# Patient Record
Sex: Male | Born: 1960
Health system: Southern US, Community
[De-identification: ages and names within clinical notes are randomized; demographics above are authoritative.]

## PROBLEM LIST (undated history)

## (undated) DIAGNOSIS — Z95810 Presence of automatic (implantable) cardiac defibrillator: Secondary | ICD-10-CM

## (undated) DIAGNOSIS — F32A Depression, unspecified: Secondary | ICD-10-CM

## (undated) DIAGNOSIS — R519 Headache, unspecified: Secondary | ICD-10-CM

## (undated) DIAGNOSIS — E119 Type 2 diabetes mellitus without complications: Secondary | ICD-10-CM

## (undated) DIAGNOSIS — I110 Hypertensive heart disease with heart failure: Secondary | ICD-10-CM

## (undated) DIAGNOSIS — F419 Anxiety disorder, unspecified: Secondary | ICD-10-CM

## (undated) DIAGNOSIS — I499 Cardiac arrhythmia, unspecified: Secondary | ICD-10-CM

## (undated) DIAGNOSIS — R51 Headache: Secondary | ICD-10-CM

## (undated) DIAGNOSIS — I428 Other cardiomyopathies: Secondary | ICD-10-CM

## (undated) DIAGNOSIS — G8929 Other chronic pain: Secondary | ICD-10-CM

## (undated) DIAGNOSIS — M1990 Unspecified osteoarthritis, unspecified site: Secondary | ICD-10-CM

## (undated) DIAGNOSIS — Z973 Presence of spectacles and contact lenses: Secondary | ICD-10-CM

## (undated) DIAGNOSIS — J302 Other seasonal allergic rhinitis: Secondary | ICD-10-CM

## (undated) DIAGNOSIS — I5042 Chronic combined systolic (congestive) and diastolic (congestive) heart failure: Secondary | ICD-10-CM

## (undated) DIAGNOSIS — I5040 Unspecified combined systolic (congestive) and diastolic (congestive) heart failure: Secondary | ICD-10-CM

## (undated) DIAGNOSIS — G4730 Sleep apnea, unspecified: Secondary | ICD-10-CM

## (undated) DIAGNOSIS — K922 Gastrointestinal hemorrhage, unspecified: Secondary | ICD-10-CM

## (undated) DIAGNOSIS — S6291XA Unspecified fracture of right wrist and hand, initial encounter for closed fracture: Secondary | ICD-10-CM

## (undated) DIAGNOSIS — M25552 Pain in left hip: Secondary | ICD-10-CM

## (undated) DIAGNOSIS — F329 Major depressive disorder, single episode, unspecified: Secondary | ICD-10-CM

## (undated) HISTORY — PX: CATARACT EXTRACTION: SUR2

## (undated) HISTORY — PX: WISDOM TOOTH EXTRACTION: SHX21

## (undated) HISTORY — DX: Other cardiomyopathies: I42.8

## (undated) HISTORY — DX: Unspecified combined systolic (congestive) and diastolic (congestive) heart failure: I50.40

## (undated) HISTORY — DX: Chronic combined systolic (congestive) and diastolic (congestive) heart failure: I50.42

## (undated) HISTORY — DX: Hypertensive heart disease with heart failure: I11.0

## (undated) HISTORY — PX: CARDIAC CATHETERIZATION: SHX172

---
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
## 2012-07-23 ENCOUNTER — Ambulatory Visit (HOSPITAL_COMMUNITY)
Admission: RE | Admit: 2012-07-23 | Discharge: 2012-07-23 | Disposition: A | Discharge status: Home / Self Care | Payer: 59 | Attending: Psychiatry | Admitting: Psychiatry

## 2012-07-23 DIAGNOSIS — Z7189 Other specified counseling: Secondary | ICD-10-CM | POA: Insufficient documentation

## 2012-07-23 DIAGNOSIS — F1011 Alcohol abuse, in remission: Secondary | ICD-10-CM | POA: Insufficient documentation

## 2012-07-23 DIAGNOSIS — Z630 Problems in relationship with spouse or partner: Secondary | ICD-10-CM | POA: Insufficient documentation

## 2012-07-23 NOTE — BH Assessment (Addendum)
Assessment Note   John [name] is an 51 y.o. male who presents voluntarily for assessment. Pt is in divorce proceedings with his wife. It was requested pt have assessment at our facility. Pt is pleasant and cooperative. He reports euthymic mood. Pt's affect is mood congruent. He denies SI and HI. He denies [facility] and no delusions noted. Pt states he has been sober since 09/18/11. Pt reports he attends Alcoholic Anonymous meetings regularly. He states he worked w/ his AA sponsor regularly until sponsor's recent relapse. Pt went to [facility] 2006 for alcohol dependence. Pt reports decreased sleep, but he gets 8 hrs of sleep nightly. He states he works full time at [employer]. Pt saw Rosal[facility]ess [facility] for therapy until Millan quit taking pt's insurance. Pt does not meet criteria for inpatient or outpatient treatment. Patient states he will continue to regularly attend AA meetings. Pt declined request for a medical screening evaluation.     Axis I: V61.10 Partner Relational Problem            Alcohol Abuse, Early Full Remission            Axis II: Deferred Axis III: No past medical history on file. Axis IV: other psychosocial or environmental problems Axis V: 85  Past Medical History: No past medical history on file.  No past surgical history on file.  Family History: No family history on file.  Social History:  does not have a smoking history on file. He does not have any smokeless tobacco history on file. His alcohol and drug histories not on file.  Additional Social History:  Alcohol / Drug Use Pain Medications: none Prescriptions: none Over the Counter: none History of alcohol / drug use?: Yes Substance #1 Name of Substance 1: alcohol 1 - Age of First Use: 17 1 - Amount (size/oz): varied 1 - Duration: sober since 09/18/11 1 - Last Use / Amount: 09/18/11 - 2 glasses wine  CIWA:   COWS:    Allergies: Allergies no known allergies  Home Medications:  (Not in a hospital  admission)  OB/GYN Status:  No LMP for male patient.  General Assessment Data Location of Assessment: [facility] Assessment Services Living Arrangements: Alone Can pt return to current living arrangement?: Yes Admission Status: Voluntary Is patient capable of signing voluntary admission?: Yes Transfer from: Home Referral Source: Self/Family/Friend  Education Status Is patient currently in school?: No Highest grade of school patient has completed: 31 Name of school: [employer] in [place]  Risk to self Suicidal Ideation: No Suicidal Intent: No Is patient at risk for suicide?: No Suicidal Plan?: No Access to Means: No What has been your use of drugs/alcohol within the last 12 months?: last used alcohol 09/18/11 Previous Attempts/Gestures: No How many times?: 0  Other Self Harm Risks: na Triggers for Past Attempts: Other (Comment) (na) Intentional Self Injurious Behavior: None Family Suicide History: No Recent stressful life event(s): Divorce (ongoing divorce proceedings) Persecutory voices/beliefs?: No Depression: No Substance abuse history and/or treatment for substance abuse?: Yes Suicide prevention information given to non-admitted patients: Not applicable  Risk to Others Homicidal Ideation: No Thoughts of Harm to Others: No Current Homicidal Intent: No Current Homicidal Plan: No Access to Homicidal Means: No Identified Victim: na History of harm to others?: No Assessment of Violence: None Noted Violent Behavior Description: pt denies violence behavior Does patient have access to weapons?: No Criminal Charges Pending?: No  Psychosis Hallucinations: None noted Delusions: None noted  Mental Status  Report Appear/Hygiene: Other (Comment) (unremarkable, appropriate in street clothes) Eye Contact: Good Motor Activity: Freedom of movement Speech: Logical/coherent Level of Consciousness: Alert Mood: Other (Comment) (euthymic) Affect: Appropriate to  circumstance Anxiety Level: Minimal Thought Processes: Relevant;Coherent Judgement: Unimpaired Orientation: Person;Situation;Time;Place;Appropriate for developmental age Obsessive Compulsive Thoughts/Behaviors: None  Cognitive Functioning Concentration: Normal Memory: Recent Intact;Remote Intact IQ: Average Insight: Good Impulse Control: Good Appetite: Good Weight Loss: 0  Weight Gain: 0  Sleep: Decreased Total Hours of Sleep: 8  Vegetative Symptoms: None  ADLScreening [facility] Assessment Services) Patient's cognitive ability adequate to safely complete daily activities?: Yes Patient able to express need for assistance with ADLs?: Yes Independently performs ADLs?: Yes (appropriate for developmental age)  Abuse/Neglect [facility]) Physical Abuse: Denies Verbal Abuse: Denies Sexual Abuse: Denies  Prior Inpatient Therapy Prior Inpatient Therapy: Yes Prior Therapy Dates: 2006 Prior Therapy Facilty/Provider(s): [facility] Reason for Treatment: alcohol abuse  Prior Outpatient Therapy Prior Outpatient Therapy: Yes Prior Therapy Dates: recently ended b/c therapist no longer takes insurance Prior [facility]Provider([facility] Reason for Treatment: therapy  ADL Screening (condition at time of admission) Patient's cognitive ability adequate to safely complete daily activities?: Yes Patient able to express need for assistance with ADLs?: Yes Independently performs ADLs?: Yes (appropriate for developmental age) Weakness of Legs: None Weakness of Arms/Hands: None  Home Assistive Devices/Equipment Home Assistive Devices/Equipment: None    Abuse/Neglect Assessment (Assessment to be complete while patient is alone) Physical Abuse: Denies Verbal Abuse: Denies Sexual Abuse: Denies Exploitation of patient/patient's resources: Denies Self-Neglect: Denies     Merchant navy officer (For Healthcare) Advance Directive: Patient does not have advance directive;Patient would  not like information    Additional Information 1:1 In Past 12 Months?: No CIRT Risk: No Elopement Risk: No Does patient have medical clearance?: No     Disposition:  Disposition Disposition of Patient: Other dispositions Other disposition(s): Other (Comment) (pt will continue to attend AA meetings )  On Site Evaluation by:   Reviewed with Physician:     Donnamarie Rossetti P 07/23/2012 4:40 PM

## 2012-08-26 ENCOUNTER — Ambulatory Visit (HOSPITAL_COMMUNITY)
Admission: RE | Admit: 2012-08-26 | Discharge: 2012-08-26 | Disposition: A | Discharge status: Home / Self Care | Payer: 59 | Attending: Psychiatry | Admitting: Psychiatry

## 2012-08-26 DIAGNOSIS — F1011 Alcohol abuse, in remission: Secondary | ICD-10-CM | POA: Insufficient documentation

## 2012-08-26 DIAGNOSIS — Z630 Problems in relationship with spouse or partner: Secondary | ICD-10-CM | POA: Insufficient documentation

## 2012-08-26 DIAGNOSIS — Z7189 Other specified counseling: Secondary | ICD-10-CM | POA: Insufficient documentation

## 2012-08-26 NOTE — BH Assessment (Signed)
Assessment Note         John [name] is an 51 y.o. male. Pt reports he was his therapist suggested he come here to get   An assessment.  Regarding his substance abuse.  Pt reports he has not drank since 09/19/11 and attends AA. He seems to benefit from AA as he has been sober for 11 months and has no desire to drink. He does not appear to be a candidate for any other program offered here. He denies any DUI charges  or arrests.  He denies suicidal  or homicidal thoughts.  Patient is not psychotic nor delusional.      Axis I: V61.10 Partner Relational Problem            AlcoholAbuse, Early Full Remission Axis II: Deferred Axis III: No past medical history on file. Axis IV: other psychosocial or environmental problems Axis V: 85  Past Medical History: No past medical history on file.  No past surgical history on file.  Family History: No family history on file.  Social History:  does not have a smoking history on file. He does not have any smokeless tobacco history on file. His alcohol and drug histories not on file.  Additional Social History:  Alcohol / Drug Use Pain Medications: na Prescriptions: na Over the Counter: na History of alcohol / drug use?: Yes Longest period of sobriety (when/how long): 11  months Substance #1 Name of Substance 1: beer and wine 1 - Age of First Use: 17 1 - Amount (size/oz): varied 1 - Frequency: sober since 09/18/11 1 - Duration: years 1 - Last Use / Amount: 09-18-11  2 glasses of wine  CIWA:   COWS:    Allergies: Allergies no known allergies  Home Medications:  (Not in a hospital admission)  OB/GYN Status:  No LMP for male patient.  General Assessment Data Location of Assessment: [facility] Assessment Services Living Arrangements: Alone Can pt return to current living arrangement?: Yes Admission Status: Voluntary Is patient capable of signing voluntary admission?: Yes Transfer from: Home Referral Source: Other (attorney per  patient)  Education Status Is patient currently in school?: No Highest grade of school patient has completed: 14 (2 years at [employer], [place]) Name of school: [employer] in [place] person: Winfield Cunas [phone]  Risk to self Suicidal Ideation: No Suicidal Intent: No Is patient at risk for suicide?: No Suicidal Plan?: No Access to Means: No What has been your use of drugs/alcohol within the last 12 months?: alcohol Previous Attempts/Gestures: No How many times?: 0  Other Self Harm Risks: na Triggers for Past Attempts: None known Intentional Self Injurious Behavior: None Family Suicide History: No Recent stressful life event(s): Divorce (ongoing divorce proceedings) Persecutory voices/beliefs?: No Depression: No Depression Symptoms:  (none) Substance abuse history and/or treatment for substance abuse?: Yes Suicide prevention information given to non-admitted patients: Not applicable  Risk to Others Homicidal Ideation: No Thoughts of Harm to Others: No Current Homicidal Intent: No Current Homicidal Plan: No Access to Homicidal Means: No History of harm to others?: No Assessment of Violence: None Noted Violent Behavior Description: none Does patient have access to weapons?: No Criminal Charges Pending?: No Does patient have a court date: No  Psychosis Hallucinations: None noted Delusions: None noted  Mental Status Report Appear/Hygiene: Other (Comment) Eye Contact: Good Motor Activity: Freedom of movement Speech: Logical/coherent Level of Consciousness: Alert Mood: Despair Affect: Appropriate to circumstance Anxiety Level: Minimal Thought Processes: Coherent;Relevant Judgement: Unimpaired Orientation:  Person;Situation;Time;Place;Appropriate for developmental age Obsessive Compulsive Thoughts/Behaviors: None  Cognitive Functioning Concentration: Normal Memory: Recent Intact;Remote Intact IQ: Average Impulse Control: Good Appetite:  Good Weight Loss: 0  Weight Gain: 0  Sleep: Decreased (frequent awakening during the night) Total Hours of Sleep: 8  Vegetative Symptoms: None  ADLScreening [facility] Assessment Services) Patient's cognitive ability adequate to safely complete daily activities?: Yes Patient able to express need for assistance with ADLs?: Yes Independently performs ADLs?: Yes (appropriate for developmental age)  Abuse/Neglect [facility]) Physical Abuse: Denies Verbal Abuse: Denies Sexual Abuse: Denies  Prior Inpatient Therapy Prior Inpatient Therapy: Yes Prior Therapy Dates: 2006 Prior Therapy Facilty/Provider(s): [facility] Reason for Treatment: alcohol abuse  Prior Outpatient Therapy Prior Outpatient Therapy: Yes Prior Therapy Dates: recently ended b/c therapist no longer takes insurance Prior Therapy [facility]/Provider(s): [facility] Reason for Treatment: therapy  ADL Screening (condition at time of admission) Patient's cognitive ability adequate to safely complete daily activities?: Yes Patient able to express need for assistance with ADLs?: Yes Independently performs ADLs?: Yes (appropriate for developmental age) Weakness of Legs: None Weakness of Arms/Hands: None  Home Assistive Devices/Equipment Home Assistive Devices/Equipment: Eyeglasses  Therapy Consults (therapy consults require a physician order) PT Evaluation Needed: No OT Evalulation Needed: No SLP Evaluation Needed: No Abuse/Neglect Assessment (Assessment to be complete while patient is alone) Physical Abuse: Denies Verbal Abuse: Denies Sexual Abuse: Denies Exploitation of patient/patient's resources: Denies Self-Neglect: Denies Values / Beliefs Cultural Requests During Hospitalization: None Spiritual Requests During Hospitalization: None Consults Spiritual Care Consult Needed: No Social Work Consult Needed: No Merchant navy officer (For Healthcare) Advance Directive: Patient does not have advance directive;Patient  would not like information Pre-existing out of facility DNR order (yellow form or pink MOST form): No    Additional Information 1:1 In Past 12 Months?: No CIRT Risk: No Elopement Risk: No Does patient have medical clearance?: No     Disposition: To continue to attend AA meetings Disposition Disposition of Patient: Other dispositions Other disposition(s): Other (Comment) (Pt recommended to continue to attend AA meetings)  On Site Evaluation by:   Reviewed with Physician:     Hattie Perch Winford 08/26/2012 3:26 PM

## 2013-12-27 ENCOUNTER — Ambulatory Visit (INDEPENDENT_AMBULATORY_CARE_PROVIDER_SITE_OTHER): Payer: 59 | Admitting: Psychology

## 2013-12-27 DIAGNOSIS — F4322 Adjustment disorder with anxiety: Secondary | ICD-10-CM

## 2014-01-11 ENCOUNTER — Ambulatory Visit (INDEPENDENT_AMBULATORY_CARE_PROVIDER_SITE_OTHER): Payer: 59 | Admitting: Psychology

## 2014-01-11 DIAGNOSIS — F4322 Adjustment disorder with anxiety: Secondary | ICD-10-CM

## 2014-01-26 ENCOUNTER — Ambulatory Visit (INDEPENDENT_AMBULATORY_CARE_PROVIDER_SITE_OTHER): Payer: 59 | Admitting: Psychology

## 2014-01-26 DIAGNOSIS — F411 Generalized anxiety disorder: Secondary | ICD-10-CM

## 2014-02-04 ENCOUNTER — Ambulatory Visit (INDEPENDENT_AMBULATORY_CARE_PROVIDER_SITE_OTHER): Payer: 59 | Admitting: Psychology

## 2014-02-04 DIAGNOSIS — F411 Generalized anxiety disorder: Secondary | ICD-10-CM

## 2014-02-15 ENCOUNTER — Ambulatory Visit (INDEPENDENT_AMBULATORY_CARE_PROVIDER_SITE_OTHER): Payer: 59 | Admitting: Psychology

## 2014-02-15 DIAGNOSIS — F411 Generalized anxiety disorder: Secondary | ICD-10-CM

## 2014-02-23 ENCOUNTER — Ambulatory Visit (INDEPENDENT_AMBULATORY_CARE_PROVIDER_SITE_OTHER): Payer: 59 | Admitting: Psychology

## 2014-02-23 DIAGNOSIS — F411 Generalized anxiety disorder: Secondary | ICD-10-CM

## 2014-03-01 ENCOUNTER — Ambulatory Visit (INDEPENDENT_AMBULATORY_CARE_PROVIDER_SITE_OTHER): Payer: 59 | Admitting: Psychology

## 2014-03-01 DIAGNOSIS — F411 Generalized anxiety disorder: Secondary | ICD-10-CM

## 2014-03-09 ENCOUNTER — Ambulatory Visit (INDEPENDENT_AMBULATORY_CARE_PROVIDER_SITE_OTHER): Payer: 59 | Admitting: Psychology

## 2014-03-09 DIAGNOSIS — F411 Generalized anxiety disorder: Secondary | ICD-10-CM

## 2014-03-11 ENCOUNTER — Ambulatory Visit: Payer: 59 | Admitting: Psychology

## 2014-03-17 ENCOUNTER — Ambulatory Visit (INDEPENDENT_AMBULATORY_CARE_PROVIDER_SITE_OTHER): Payer: 59 | Admitting: Psychology

## 2014-03-17 DIAGNOSIS — F411 Generalized anxiety disorder: Secondary | ICD-10-CM

## 2014-03-24 ENCOUNTER — Ambulatory Visit (INDEPENDENT_AMBULATORY_CARE_PROVIDER_SITE_OTHER): Payer: 59 | Admitting: Psychology

## 2014-03-24 DIAGNOSIS — F411 Generalized anxiety disorder: Secondary | ICD-10-CM

## 2014-03-31 ENCOUNTER — Ambulatory Visit (INDEPENDENT_AMBULATORY_CARE_PROVIDER_SITE_OTHER): Payer: 59 | Admitting: Psychology

## 2014-03-31 DIAGNOSIS — F411 Generalized anxiety disorder: Secondary | ICD-10-CM

## 2014-04-22 ENCOUNTER — Ambulatory Visit (INDEPENDENT_AMBULATORY_CARE_PROVIDER_SITE_OTHER): Payer: 59 | Admitting: Psychology

## 2014-04-22 DIAGNOSIS — F411 Generalized anxiety disorder: Secondary | ICD-10-CM

## 2014-04-27 ENCOUNTER — Ambulatory Visit (INDEPENDENT_AMBULATORY_CARE_PROVIDER_SITE_OTHER): Payer: 59 | Admitting: Psychology

## 2014-04-27 DIAGNOSIS — F411 Generalized anxiety disorder: Secondary | ICD-10-CM

## 2014-05-03 ENCOUNTER — Ambulatory Visit (INDEPENDENT_AMBULATORY_CARE_PROVIDER_SITE_OTHER): Payer: 59 | Admitting: Psychology

## 2014-05-03 DIAGNOSIS — F411 Generalized anxiety disorder: Secondary | ICD-10-CM

## 2014-05-11 ENCOUNTER — Ambulatory Visit (INDEPENDENT_AMBULATORY_CARE_PROVIDER_SITE_OTHER): Payer: 59 | Admitting: Psychology

## 2014-05-11 DIAGNOSIS — F411 Generalized anxiety disorder: Secondary | ICD-10-CM

## 2014-06-01 ENCOUNTER — Ambulatory Visit (INDEPENDENT_AMBULATORY_CARE_PROVIDER_SITE_OTHER): Payer: 59 | Admitting: Psychology

## 2014-06-01 ENCOUNTER — Ambulatory Visit: Payer: 59 | Admitting: Psychology

## 2014-06-01 DIAGNOSIS — F411 Generalized anxiety disorder: Secondary | ICD-10-CM

## 2014-06-06 ENCOUNTER — Ambulatory Visit (INDEPENDENT_AMBULATORY_CARE_PROVIDER_SITE_OTHER): Payer: 59 | Admitting: Psychology

## 2014-06-06 DIAGNOSIS — F411 Generalized anxiety disorder: Secondary | ICD-10-CM

## 2014-06-14 ENCOUNTER — Ambulatory Visit (INDEPENDENT_AMBULATORY_CARE_PROVIDER_SITE_OTHER): Payer: 59 | Admitting: Psychology

## 2014-06-14 DIAGNOSIS — F411 Generalized anxiety disorder: Secondary | ICD-10-CM

## 2014-07-04 ENCOUNTER — Ambulatory Visit (INDEPENDENT_AMBULATORY_CARE_PROVIDER_SITE_OTHER): Payer: 59 | Admitting: Psychology

## 2014-07-04 DIAGNOSIS — F411 Generalized anxiety disorder: Secondary | ICD-10-CM

## 2014-07-06 ENCOUNTER — Ambulatory Visit (INDEPENDENT_AMBULATORY_CARE_PROVIDER_SITE_OTHER): Payer: 59 | Admitting: Psychology

## 2014-07-06 DIAGNOSIS — F411 Generalized anxiety disorder: Secondary | ICD-10-CM

## 2014-07-13 ENCOUNTER — Ambulatory Visit (INDEPENDENT_AMBULATORY_CARE_PROVIDER_SITE_OTHER): Payer: 59 | Admitting: Psychology

## 2014-07-13 DIAGNOSIS — F411 Generalized anxiety disorder: Secondary | ICD-10-CM

## 2014-08-01 ENCOUNTER — Ambulatory Visit (INDEPENDENT_AMBULATORY_CARE_PROVIDER_SITE_OTHER): Payer: 59 | Admitting: Psychology

## 2014-08-01 DIAGNOSIS — F411 Generalized anxiety disorder: Secondary | ICD-10-CM

## 2014-08-22 ENCOUNTER — Ambulatory Visit: Payer: 59 | Admitting: Psychology

## 2014-08-24 ENCOUNTER — Ambulatory Visit (INDEPENDENT_AMBULATORY_CARE_PROVIDER_SITE_OTHER): Payer: 59 | Admitting: Psychology

## 2014-08-24 DIAGNOSIS — F411 Generalized anxiety disorder: Secondary | ICD-10-CM

## 2014-08-26 ENCOUNTER — Ambulatory Visit (INDEPENDENT_AMBULATORY_CARE_PROVIDER_SITE_OTHER): Payer: 59 | Admitting: Psychology

## 2014-08-26 DIAGNOSIS — F411 Generalized anxiety disorder: Secondary | ICD-10-CM

## 2014-09-21 ENCOUNTER — Ambulatory Visit (INDEPENDENT_AMBULATORY_CARE_PROVIDER_SITE_OTHER): Payer: 59 | Admitting: Psychology

## 2014-09-21 DIAGNOSIS — F411 Generalized anxiety disorder: Secondary | ICD-10-CM

## 2014-09-30 ENCOUNTER — Ambulatory Visit (INDEPENDENT_AMBULATORY_CARE_PROVIDER_SITE_OTHER): Payer: 59 | Admitting: Psychology

## 2014-09-30 DIAGNOSIS — F411 Generalized anxiety disorder: Secondary | ICD-10-CM

## 2014-10-07 ENCOUNTER — Ambulatory Visit: Payer: 59 | Admitting: Psychology

## 2014-10-12 ENCOUNTER — Ambulatory Visit (INDEPENDENT_AMBULATORY_CARE_PROVIDER_SITE_OTHER): Payer: 59 | Admitting: Psychology

## 2014-10-12 DIAGNOSIS — F411 Generalized anxiety disorder: Secondary | ICD-10-CM

## 2014-10-24 ENCOUNTER — Ambulatory Visit (INDEPENDENT_AMBULATORY_CARE_PROVIDER_SITE_OTHER): Payer: 59 | Admitting: Psychology

## 2014-10-24 DIAGNOSIS — F411 Generalized anxiety disorder: Secondary | ICD-10-CM

## 2014-10-28 ENCOUNTER — Ambulatory Visit (INDEPENDENT_AMBULATORY_CARE_PROVIDER_SITE_OTHER): Payer: 59 | Admitting: Psychology

## 2014-10-28 DIAGNOSIS — F411 Generalized anxiety disorder: Secondary | ICD-10-CM

## 2014-12-30 ENCOUNTER — Ambulatory Visit (INDEPENDENT_AMBULATORY_CARE_PROVIDER_SITE_OTHER): Payer: Managed Care, Other (non HMO) | Admitting: Psychology

## 2014-12-30 DIAGNOSIS — F411 Generalized anxiety disorder: Secondary | ICD-10-CM

## 2015-01-09 ENCOUNTER — Ambulatory Visit (INDEPENDENT_AMBULATORY_CARE_PROVIDER_SITE_OTHER): Payer: Managed Care, Other (non HMO) | Admitting: Psychology

## 2015-01-09 DIAGNOSIS — F411 Generalized anxiety disorder: Secondary | ICD-10-CM | POA: Diagnosis not present

## 2015-03-01 ENCOUNTER — Ambulatory Visit (INDEPENDENT_AMBULATORY_CARE_PROVIDER_SITE_OTHER): Payer: Managed Care, Other (non HMO) | Admitting: Psychology

## 2015-03-01 DIAGNOSIS — F411 Generalized anxiety disorder: Secondary | ICD-10-CM | POA: Diagnosis not present

## 2015-03-14 ENCOUNTER — Ambulatory Visit: Payer: Self-pay | Admitting: Orthopedic Surgery

## 2015-03-17 ENCOUNTER — Ambulatory Visit: Payer: Self-pay | Admitting: Orthopedic Surgery

## 2015-03-17 NOTE — Pre-Procedure Instructions (Signed)
Rasool Pennel  03/17/2015      [id number] [name],  - [address] [place] [address] Phone: [phone] Fax: [phone]    Your procedure is scheduled on Monday, March 27, 2015  Report to [facility] Admitting after  calling [phone] at 8:00 A.M. for arrival time on day of procedure.  Call this number if you have problems the morning of surgery:  [phone]   Remember:  Do not eat food or drink liquids after midnight Sunday, March 26, 2015  Take these medicines the morning of surgery with A SIP OF WATER : baclofen (LIORESAL), cetirizine (ZYRTEC),  if needed:LORazepam (ATIVAN)  Stop taking Aspirin, vitamins and herbal medications. Do not take any NSAIDs ie: Ibuprofen, Advil, Naproxen or any medication containing Aspirin; stop now.   Do not wear jewelry, make-up or nail polish.  Do not wear lotions, powders, or perfumes.  You may not wear deodorant.  Do not shave 48 hours prior to surgery.  Men may shave face and neck.  Do not bring valuables to the hospital.  [facility] is not responsible for any belongings or valuables.  Contacts, dentures or bridgework may not be worn into surgery.  Leave your suitcase in the car.  After surgery it may be brought to your room.  For patients admitted to the hospital, discharge time will be determined by your treatment team.  Patients discharged the day of surgery will not be allowed to drive home.   Name and phone number of your driver:   Special instructions: Special Instructions:Special Instructions: [facility] - Preparing for Surgery  Before surgery, you can play an important role.  Because skin is not sterile, your skin needs to be as free of germs as possible.  You can reduce the number of germs on you skin by washing with CHG (chlorahexidine gluconate) soap before surgery.  CHG is an antiseptic cleaner which kills germs and bonds with the skin to continue killing germs even after  washing.  Please DO NOT use if you have an allergy to CHG or antibacterial soaps.  If your skin becomes reddened/irritated stop using the CHG and inform your nurse when you arrive at Short Stay.  Do not shave (including legs and underarms) for at least 48 hours prior to the first CHG shower.  You may shave your face.  Please follow these instructions carefully:   1.  Shower with CHG Soap the night before surgery and the morning of Surgery.  2.  If you choose to wash your hair, wash your hair first as usual with your normal shampoo.  3.  After you shampoo, rinse your hair and body thoroughly to remove the Shampoo.  4.  Use CHG as you would any other liquid soap.  You can apply chg directly  to the skin and wash gently with scrungie or a clean washcloth.  5.  Apply the CHG Soap to your body ONLY FROM THE NECK DOWN.  Do not use on open wounds or open sores.  Avoid contact with your eyes, ears, mouth and genitals (private parts).  Wash genitals (private parts) with your normal soap.  6.  Wash thoroughly, paying special attention to the area where your surgery will be performed.  7.  Thoroughly rinse your body with warm water from the neck down.  8.  DO NOT shower/wash with your normal soap after using and rinsing off the CHG Soap.  9.  Pat yourself dry with  a clean towel.            10.  Wear clean pajamas.            11.  Place clean sheets on your bed the night of your first shower and do not sleep with pets.  Day of Surgery  Do not apply any lotions/deodorants the morning of surgery.  Please wear clean clothes to the hospital/surgery center.  Please read over the following fact sheets that you were given. Pain Booklet, Coughing and Deep Breathing, Blood Transfusion Information, Total Joint Packet, MRSA Information and Surgical Site Infection Prevention

## 2015-03-17 NOTE — H&P (Signed)
TOTAL HIP ADMISSION H&P  Patient is admitted for left total hip arthroplasty.  Subjective:  Chief Complaint: left hip pain  HPI: John [name], 54 y.o. male, has a history of pain and functional disability in the left hip(s) due to arthritis and patient has failed non-surgical conservative treatments for greater than 12 weeks to include NSAID's and/or analgesics, use of assistive devices, weight reduction as appropriate and activity modification.  Onset of symptoms was gradual starting 1 years ago with gradually worsening course since that time.The patient noted no past surgery on the left hip(s).  Patient currently rates pain in the left hip at 10 out of 10 with activity. Patient has night pain, worsening of pain with activity and weight bearing, pain that interfers with activities of daily living and pain with passive range of motion. Patient has evidence of subchondral cysts, subchondral sclerosis, periarticular osteophytes and joint space narrowing by imaging studies. This condition presents safety issues increasing the risk of falls. There is no current active infection.  There are no active problems to display for this patient.  No past medical history on file.  OSA on CPAP No past surgical history on file.  none  (Not in a hospital admission) No Known Allergies  History  Substance Use Topics  . Smoking status: Not on file  . Smokeless tobacco: Not on file  . Alcohol Use: Not on file    No family history on file.   Review of Systems  Constitutional: Negative.   HENT: Negative.   Eyes: Negative.   Respiratory: Negative.   Cardiovascular: Negative.   Gastrointestinal: Negative.   Genitourinary: Negative.   Musculoskeletal: Positive for joint pain.  Skin: Negative.   Neurological: Negative.   Endo/Heme/Allergies: Negative.   Psychiatric/Behavioral: The patient is nervous/anxious.     Objective:  Physical Exam  Vitals reviewed. Constitutional: He is oriented to  person, place, and time. He appears well-developed and well-nourished.  HENT:  Head: Normocephalic and atraumatic.  Eyes: Conjunctivae and EOM are normal. Pupils are equal, round, and reactive to light.  Neck: Normal range of motion. Neck supple.  Cardiovascular: Normal rate, regular rhythm, normal heart sounds and intact distal pulses.   Respiratory: Effort normal and breath sounds normal.  GI: Soft. Bowel sounds are normal. He exhibits no distension. There is no tenderness.  Genitourinary:  deferred  Musculoskeletal:       Left hip: He exhibits decreased range of motion and decreased strength.  Neurological: He is alert and oriented to person, place, and time. He has normal reflexes.  Skin: Skin is warm and dry.  Psychiatric: He has a normal mood and affect. His behavior is normal. Judgment and thought content normal.    Vital signs in last 24 hours: @VSRANGES@  Labs:   There is no height or weight on file to calculate BMI.   Imaging Review Plain radiographs demonstrate severe degenerative joint disease of the left hip(s). The bone quality appears to be adequate for age and reported activity level.  Assessment/Plan:  End stage arthritis, left hip(s)  The patient history, physical examination, clinical judgement of the provider and imaging studies are consistent with end stage degenerative joint disease of the left hip(s) and total hip arthroplasty is deemed medically necessary. The treatment options including medical management, injection therapy, arthroscopy and arthroplasty were discussed at length. The risks and benefits of total hip arthroplasty were presented and reviewed. The risks due to aseptic loosening, infection, stiffness, dislocation/subluxation,  thromboembolic complications and other imponderables were discussed.    The patient acknowledged the explanation, agreed to proceed with the plan and consent was signed. Patient is being admitted for inpatient treatment for  surgery, pain control, PT, OT, prophylactic antibiotics, VTE prophylaxis, progressive ambulation and ADL's and discharge planning.The patient is planning to be discharged home with home health services

## 2015-03-21 ENCOUNTER — Encounter (HOSPITAL_COMMUNITY): Payer: Self-pay

## 2015-03-21 ENCOUNTER — Encounter (HOSPITAL_COMMUNITY)
Admission: RE | Admit: 2015-03-21 | Discharge: 2015-03-21 | Disposition: A | Discharge status: Home / Self Care | Payer: Managed Care, Other (non HMO) | Source: Ambulatory Visit | Attending: Orthopedic Surgery | Admitting: Orthopedic Surgery

## 2015-03-21 DIAGNOSIS — Z01812 Encounter for preprocedural laboratory examination: Secondary | ICD-10-CM | POA: Insufficient documentation

## 2015-03-21 DIAGNOSIS — M1612 Unilateral primary osteoarthritis, left hip: Secondary | ICD-10-CM | POA: Diagnosis not present

## 2015-03-21 HISTORY — DX: Unspecified fracture of right wrist and hand, initial encounter for closed fracture: S62.91XA

## 2015-03-21 HISTORY — DX: Major depressive disorder, single episode, unspecified: F32.9

## 2015-03-21 HISTORY — DX: Other chronic pain: G89.29

## 2015-03-21 HISTORY — DX: Depression, unspecified: F32.A

## 2015-03-21 HISTORY — DX: Pain in left hip: M25.552

## 2015-03-21 HISTORY — DX: Anxiety disorder, unspecified: F41.9

## 2015-03-21 HISTORY — DX: Headache, unspecified: R51.9

## 2015-03-21 HISTORY — DX: Headache: R51

## 2015-03-21 HISTORY — DX: Sleep apnea, unspecified: G47.30

## 2015-03-21 LAB — APTT: aPTT: 29 seconds (ref 24–37)

## 2015-03-21 LAB — COMPREHENSIVE METABOLIC PANEL
ALT: 260 U/L — ABNORMAL HIGH (ref 17–63)
ANION GAP: 9 (ref 5–15)
AST: 159 U/L — ABNORMAL HIGH (ref 15–41)
Albumin: 3.6 g/dL (ref 3.5–5.0)
Alkaline Phosphatase: 59 U/L (ref 38–126)
BILIRUBIN TOTAL: 1.4 mg/dL — AB (ref 0.3–1.2)
BUN: 11 mg/dL (ref 6–20)
CO2: 23 mmol/L (ref 22–32)
Calcium: 9.2 mg/dL (ref 8.9–10.3)
Chloride: 104 mmol/L (ref 101–111)
Creatinine, Ser: 1.02 mg/dL (ref 0.61–1.24)
GFR calc Af Amer: >60 mL/min (ref >60)
GFR calc non Af Amer: >60 mL/min (ref >60)
GLUCOSE: 128 mg/dL — AB (ref 65–99)
Potassium: 3.9 mmol/L (ref 3.5–5.1)
Sodium: 136 mmol/L (ref 135–145)
Total Protein: 7 g/dL (ref 6.5–8.1)

## 2015-03-21 LAB — TYPE AND SCREEN
ABO/RH(D): O POS
Antibody Screen: NEGATIVE

## 2015-03-21 LAB — URINALYSIS, ROUTINE W REFLEX MICROSCOPIC
Glucose, UA: NEGATIVE mg/dL
Hgb urine dipstick: NEGATIVE
KETONES UR: 15 mg/dL — AB
NITRITE: NEGATIVE
PROTEIN: NEGATIVE mg/dL
Specific Gravity, Urine: 1.028 (ref 1.005–1.030)
UROBILINOGEN UA: 0.2 mg/dL (ref 0.0–1.0)
pH: 5 (ref 5.0–8.0)

## 2015-03-21 LAB — URINE MICROSCOPIC-ADD ON

## 2015-03-21 LAB — PROTIME-INR
INR: 0.98 (ref 0.00–1.49)
PROTHROMBIN TIME: 13.2 s (ref 11.6–15.2)

## 2015-03-21 LAB — CBC
HCT: 48.5 % (ref 39.0–52.0)
HEMOGLOBIN: 16 g/dL (ref 13.0–17.0)
MCH: 33.8 pg (ref 26.0–34.0)
MCHC: 33 g/dL (ref 30.0–36.0)
MCV: 102.5 fL — ABNORMAL HIGH (ref 78.0–100.0)
Platelets: 203 10*3/uL (ref 150–400)
RBC: 4.73 MIL/uL (ref 4.22–5.81)
RDW: 12.9 % (ref 11.5–15.5)
WBC: 9.3 10*3/uL (ref 4.0–10.5)

## 2015-03-21 LAB — SURGICAL PCR SCREEN
MRSA, PCR: NEGATIVE
STAPHYLOCOCCUS AUREUS: NEGATIVE

## 2015-03-21 LAB — ABO/RH: ABO/RH(D): O POS

## 2015-03-21 MED ORDER — CHLORHEXIDINE GLUCONATE 4 % EX LIQD
60.0000 mL | Freq: Once | CUTANEOUS | Status: DC
Start: 2015-03-21 — End: 2015-03-22

## 2015-03-21 MED ORDER — CHLORHEXIDINE GLUCONATE 4 % EX LIQD: 60.0000 mL | Freq: Once | CUTANEOUS | Status: DC

## 2015-03-21 NOTE — Progress Notes (Addendum)
PCP- Dr. Margaree Mackintosh  Cardiologist - denies CXR/EKG/Echo/Cardiac Cath/Stress Test - denies  Patient wears CPAP at night but is not aware of settings.

## 2015-03-26 MED ORDER — TRANEXAMIC ACID 1000 MG/10ML IV SOLN
1000.0000 mg | INTRAVENOUS | Status: AC
  Administered 2015-03-27: 1000 mg via INTRAVENOUS
  Filled 2015-03-26 (×3): qty 10

## 2015-03-27 ENCOUNTER — Encounter (HOSPITAL_COMMUNITY): Payer: Self-pay | Admitting: Surgery

## 2015-03-27 ENCOUNTER — Encounter (HOSPITAL_COMMUNITY)
Admission: RE | Disposition: A | Discharge status: Home Health | Payer: Self-pay | Source: Ambulatory Visit | Attending: Orthopedic Surgery

## 2015-03-27 ENCOUNTER — Inpatient Hospital Stay (HOSPITAL_COMMUNITY): Payer: Managed Care, Other (non HMO) | Admitting: Anesthesiology

## 2015-03-27 ENCOUNTER — Inpatient Hospital Stay (HOSPITAL_COMMUNITY): Payer: Managed Care, Other (non HMO)

## 2015-03-27 ENCOUNTER — Inpatient Hospital Stay (HOSPITAL_COMMUNITY)
Admission: RE | Admit: 2015-03-27 | Discharge: 2015-03-29 | DRG: 470 | Disposition: A | Discharge status: Home Health | Payer: Managed Care, Other (non HMO) | Source: Ambulatory Visit | Attending: Orthopedic Surgery | Admitting: Orthopedic Surgery

## 2015-03-27 DIAGNOSIS — G4733 Obstructive sleep apnea (adult) (pediatric): Secondary | ICD-10-CM | POA: Diagnosis present

## 2015-03-27 DIAGNOSIS — R740 Nonspecific elevation of levels of transaminase and lactic acid dehydrogenase [LDH]: Secondary | ICD-10-CM

## 2015-03-27 DIAGNOSIS — Z6834 Body mass index (BMI) 34.0-34.9, adult: Secondary | ICD-10-CM | POA: Diagnosis not present

## 2015-03-27 DIAGNOSIS — I10 Essential (primary) hypertension: Secondary | ICD-10-CM | POA: Diagnosis present

## 2015-03-27 DIAGNOSIS — M879 Osteonecrosis, unspecified: Principal | ICD-10-CM | POA: Diagnosis present

## 2015-03-27 DIAGNOSIS — Z419 Encounter for procedure for purposes other than remedying health state, unspecified: Secondary | ICD-10-CM

## 2015-03-27 DIAGNOSIS — M25552 Pain in left hip: Secondary | ICD-10-CM | POA: Diagnosis present

## 2015-03-27 DIAGNOSIS — F419 Anxiety disorder, unspecified: Secondary | ICD-10-CM | POA: Diagnosis present

## 2015-03-27 DIAGNOSIS — R7401 Elevation of levels of liver transaminase levels: Secondary | ICD-10-CM | POA: Diagnosis present

## 2015-03-27 DIAGNOSIS — M1612 Unilateral primary osteoarthritis, left hip: Secondary | ICD-10-CM | POA: Diagnosis present

## 2015-03-27 DIAGNOSIS — Z09 Encounter for follow-up examination after completed treatment for conditions other than malignant neoplasm: Secondary | ICD-10-CM

## 2015-03-27 HISTORY — PX: TOTAL HIP ARTHROPLASTY: SHX124

## 2015-03-27 SURGERY — ARTHROPLASTY, HIP, TOTAL, ANTERIOR APPROACH
Anesthesia: Spinal | Site: Hip | Laterality: Left

## 2015-03-27 MED ORDER — OXYCODONE HCL 5 MG PO TABS
5.0000 mg | ORAL_TABLET | ORAL | Status: DC | PRN
  Administered 2015-03-27 – 2015-03-29 (×8): 10 mg via ORAL
  Filled 2015-03-27 (×8): qty 2

## 2015-03-27 MED ORDER — BUPIVACAINE-EPINEPHRINE (PF) 0.5% -1:200000 IJ SOLN
INTRAMUSCULAR | Status: DC | PRN
  Administered 2015-03-27: 30 mL

## 2015-03-27 MED ORDER — SODIUM CHLORIDE 0.9 % IV SOLN
INTRAVENOUS | Status: DC
  Administered 2015-03-28 (×2): via INTRAVENOUS

## 2015-03-27 MED ORDER — DOCUSATE SODIUM 100 MG PO CAPS
100.0000 mg | ORAL_CAPSULE | Freq: Two times a day (BID) | ORAL | Status: DC
  Administered 2015-03-28 – 2015-03-29 (×3): 100 mg via ORAL
  Filled 2015-03-27 (×3): qty 1

## 2015-03-27 MED ORDER — SENNA 8.6 MG PO TABS
2.0000 | ORAL_TABLET | Freq: Every day | ORAL | Status: DC
  Administered 2015-03-28: 17.2 mg via ORAL
  Filled 2015-03-27: qty 2

## 2015-03-27 MED ORDER — 0.9 % SODIUM CHLORIDE (POUR BTL) OPTIME
TOPICAL | Status: DC | PRN
  Administered 2015-03-27: 1000 mL

## 2015-03-27 MED ORDER — CEFAZOLIN SODIUM-DEXTROSE 2-3 GM-% IV SOLR
2.0000 g | INTRAVENOUS | Status: AC
  Administered 2015-03-27: 2 g via INTRAVENOUS
  Filled 2015-03-27: qty 50

## 2015-03-27 MED ORDER — SODIUM CHLORIDE 0.9 % IR SOLN
Status: DC | PRN
  Administered 2015-03-27: 3000 mL

## 2015-03-27 MED ORDER — MIDAZOLAM HCL 5 MG/5ML IJ SOLN
INTRAMUSCULAR | Status: DC | PRN
  Administered 2015-03-27: 2 mg via INTRAVENOUS

## 2015-03-27 MED ORDER — SODIUM CHLORIDE 0.9 % IJ SOLN
INTRAMUSCULAR | Status: DC | PRN
  Administered 2015-03-27: 30 mL

## 2015-03-27 MED ORDER — METOCLOPRAMIDE HCL 5 MG PO TABS: 5.0000 mg | ORAL_TABLET | Freq: Three times a day (TID) | ORAL | Status: DC | PRN

## 2015-03-27 MED ORDER — PROPOFOL 10 MG/ML IV BOLUS
INTRAVENOUS | Status: AC
  Filled 2015-03-27: qty 20

## 2015-03-27 MED ORDER — LACTATED RINGERS IV SOLN
INTRAVENOUS | Status: DC | PRN
  Administered 2015-03-27 (×3): via INTRAVENOUS

## 2015-03-27 MED ORDER — KETOROLAC TROMETHAMINE 30 MG/ML IJ SOLN
INTRAMUSCULAR | Status: DC | PRN
  Administered 2015-03-27: 30 mg via INTRA_ARTICULAR

## 2015-03-27 MED ORDER — SODIUM CHLORIDE 0.9 % IV SOLN: INTRAVENOUS | Status: DC

## 2015-03-27 MED ORDER — MIDAZOLAM HCL 2 MG/2ML IJ SOLN
INTRAMUSCULAR | Status: AC
  Filled 2015-03-27: qty 2

## 2015-03-27 MED ORDER — ASPIRIN EC 325 MG PO TBEC
325.0000 mg | DELAYED_RELEASE_TABLET | Freq: Two times a day (BID) | ORAL | Status: DC
  Administered 2015-03-28 – 2015-03-29 (×3): 325 mg via ORAL
  Filled 2015-03-27 (×3): qty 1

## 2015-03-27 MED ORDER — PHENOL 1.4 % MT LIQD: 1.0000 | OROMUCOSAL | Status: DC | PRN

## 2015-03-27 MED ORDER — FENTANYL CITRATE (PF) 100 MCG/2ML IJ SOLN
INTRAMUSCULAR | Status: DC | PRN
  Administered 2015-03-27: 50 ug via INTRAVENOUS

## 2015-03-27 MED ORDER — LORAZEPAM 0.5 MG PO TABS
0.5000 mg | ORAL_TABLET | Freq: Three times a day (TID) | ORAL | Status: DC | PRN
  Administered 2015-03-27: 1 mg via ORAL
  Filled 2015-03-27 (×2): qty 2

## 2015-03-27 MED ORDER — FENTANYL CITRATE (PF) 250 MCG/5ML IJ SOLN
INTRAMUSCULAR | Status: AC
  Filled 2015-03-27: qty 5

## 2015-03-27 MED ORDER — MEPERIDINE HCL 25 MG/ML IJ SOLN: 6.2500 mg | INTRAMUSCULAR | Status: DC | PRN

## 2015-03-27 MED ORDER — POVIDONE-IODINE 7.5 % EX SOLN
CUTANEOUS | Status: DC | PRN
  Administered 2015-03-27: 1 via TOPICAL

## 2015-03-27 MED ORDER — HYDROMORPHONE HCL 1 MG/ML IJ SOLN
0.5000 mg | INTRAMUSCULAR | Status: DC | PRN
  Administered 2015-03-28: 0.5 mg via INTRAVENOUS
  Filled 2015-03-27: qty 1

## 2015-03-27 MED ORDER — HYDROMORPHONE HCL 1 MG/ML IJ SOLN
0.2500 mg | INTRAMUSCULAR | Status: DC | PRN
  Administered 2015-03-27 (×2): 0.5 mg via INTRAVENOUS

## 2015-03-27 MED ORDER — LORATADINE 10 MG PO TABS
10.0000 mg | ORAL_TABLET | Freq: Every day | ORAL | Status: DC
  Administered 2015-03-28 – 2015-03-29 (×2): 10 mg via ORAL
  Filled 2015-03-27 (×2): qty 1

## 2015-03-27 MED ORDER — MENTHOL 3 MG MT LOZG: 1.0000 | LOZENGE | OROMUCOSAL | Status: DC | PRN

## 2015-03-27 MED ORDER — BUPIVACAINE HCL (PF) 0.5 % IJ SOLN
INTRAMUSCULAR | Status: AC
  Filled 2015-03-27: qty 10

## 2015-03-27 MED ORDER — PROPOFOL INFUSION 10 MG/ML OPTIME
INTRAVENOUS | Status: DC | PRN
  Administered 2015-03-27 (×2): 100 ug/kg/min via INTRAVENOUS

## 2015-03-27 MED ORDER — DEXAMETHASONE SODIUM PHOSPHATE 10 MG/ML IJ SOLN
10.0000 mg | Freq: Once | INTRAMUSCULAR | Status: AC
  Administered 2015-03-28: 10 mg via INTRAVENOUS
  Filled 2015-03-27: qty 1

## 2015-03-27 MED ORDER — OXYCODONE HCL 5 MG PO TABS
ORAL_TABLET | ORAL | Status: AC
  Administered 2015-03-27: 10 mg via ORAL
  Filled 2015-03-27: qty 2

## 2015-03-27 MED ORDER — HYDROMORPHONE HCL 1 MG/ML IJ SOLN
INTRAMUSCULAR | Status: AC
  Administered 2015-03-27: 0.5 mg via INTRAVENOUS
  Filled 2015-03-27: qty 1

## 2015-03-27 MED ORDER — CEFAZOLIN SODIUM-DEXTROSE 2-3 GM-% IV SOLR
2.0000 g | Freq: Four times a day (QID) | INTRAVENOUS | Status: AC
  Administered 2015-03-28 (×2): 2 g via INTRAVENOUS
  Filled 2015-03-27 (×3): qty 50

## 2015-03-27 MED ORDER — ONDANSETRON HCL 4 MG/2ML IJ SOLN: 4.0000 mg | Freq: Once | INTRAMUSCULAR | Status: DC | PRN

## 2015-03-27 MED ORDER — BUPIVACAINE HCL (PF) 0.5 % IJ SOLN
INTRAMUSCULAR | Status: DC | PRN
  Administered 2015-03-27: 3 mL via INTRATHECAL

## 2015-03-27 MED ORDER — LACTATED RINGERS IV SOLN
INTRAVENOUS | Status: DC
  Administered 2015-03-27: 15:00:00 via INTRAVENOUS

## 2015-03-27 MED ORDER — ONDANSETRON HCL 4 MG/2ML IJ SOLN: 4.0000 mg | Freq: Four times a day (QID) | INTRAMUSCULAR | Status: DC | PRN

## 2015-03-27 MED ORDER — METOCLOPRAMIDE HCL 5 MG/ML IJ SOLN: 5.0000 mg | Freq: Three times a day (TID) | INTRAMUSCULAR | Status: DC | PRN

## 2015-03-27 MED ORDER — ONDANSETRON HCL 4 MG PO TABS: 4.0000 mg | ORAL_TABLET | Freq: Four times a day (QID) | ORAL | Status: DC | PRN

## 2015-03-27 SURGICAL SUPPLY — 51 items
BLADE SAW SGTL 18X1.27X75 (BLADE) ×2 IMPLANT
BLADE SURG ROTATE 9660 (MISCELLANEOUS) IMPLANT
CAPT HIP TOTAL 2 ×2 IMPLANT
CHLORAPREP W/TINT 26ML (MISCELLANEOUS) ×2 IMPLANT
COVER SURGICAL LIGHT HANDLE (MISCELLANEOUS) ×2 IMPLANT
DERMABOND ADVANCED (GAUZE/BANDAGES/DRESSINGS) ×2
DERMABOND ADVANCED .7 DNX12 (GAUZE/BANDAGES/DRESSINGS) ×2 IMPLANT
DRAPE C-ARM 42X72 X-RAY (DRAPES) ×2 IMPLANT
DRAPE IMP U-DRAPE 54X76 (DRAPES) ×4 IMPLANT
DRAPE INCISE IOBAN 66X45 STRL (DRAPES) ×2 IMPLANT
DRAPE STERI IOBAN 125X83 (DRAPES) ×2 IMPLANT
DRAPE U-SHAPE 47X51 STRL (DRAPES) ×6 IMPLANT
DRSG AQUACEL AG ADV 3.5X10 (GAUZE/BANDAGES/DRESSINGS) ×2 IMPLANT
ELECT BLADE 4.0 EZ CLEAN MEGAD (MISCELLANEOUS) ×2
ELECT REM PT RETURN 9FT ADLT (ELECTROSURGICAL) ×2
ELECTRODE BLDE 4.0 EZ CLN MEGD (MISCELLANEOUS) ×1 IMPLANT
ELECTRODE REM PT RTRN 9FT ADLT (ELECTROSURGICAL) ×1 IMPLANT
EVACUATOR 1/8 PVC DRAIN (DRAIN) IMPLANT
GLOVE BIO SURGEON STRL SZ8.5 (GLOVE) ×4 IMPLANT
GLOVE BIOGEL PI IND STRL 8.5 (GLOVE) ×1 IMPLANT
GLOVE BIOGEL PI INDICATOR 8.5 (GLOVE) ×1
GOWN STRL REUS W/ TWL LRG LVL3 (GOWN DISPOSABLE) ×2 IMPLANT
GOWN STRL REUS W/TWL 2XL LVL3 (GOWN DISPOSABLE) ×2 IMPLANT
GOWN STRL REUS W/TWL LRG LVL3 (GOWN DISPOSABLE) ×2
HANDPIECE INTERPULSE COAX TIP (DISPOSABLE) ×1
HOOD PEEL AWAY FACE SHEILD DIS (HOOD) ×4 IMPLANT
KIT BASIN OR (CUSTOM PROCEDURE TRAY) ×2 IMPLANT
KIT ROOM TURNOVER OR (KITS) ×2 IMPLANT
MANIFOLD NEPTUNE II (INSTRUMENTS) ×2 IMPLANT
MARKER SKIN DUAL TIP RULER LAB (MISCELLANEOUS) ×2 IMPLANT
NEEDLE SPNL 18GX3.5 QUINCKE PK (NEEDLE) ×2 IMPLANT
NS IRRIG 1000ML POUR BTL (IV SOLUTION) ×2 IMPLANT
PACK TOTAL JOINT (CUSTOM PROCEDURE TRAY) ×2 IMPLANT
PACK UNIVERSAL I (CUSTOM PROCEDURE TRAY) ×2 IMPLANT
PAD ARMBOARD 7.5X6 YLW CONV (MISCELLANEOUS) ×4 IMPLANT
SEALER BIPOLAR AQUA 6.0 (INSTRUMENTS) IMPLANT
SET HNDPC FAN SPRY TIP SCT (DISPOSABLE) ×1 IMPLANT
SUCTION FRAZIER TIP 10 FR DISP (SUCTIONS) ×2 IMPLANT
SUT ETHIBOND NAB CT1 #1 30IN (SUTURE) ×4 IMPLANT
SUT MNCRL AB 3-0 PS2 18 (SUTURE) ×2 IMPLANT
SUT MON AB 2-0 CT1 36 (SUTURE) ×2 IMPLANT
SUT VIC AB 1 CT1 27 (SUTURE) ×1
SUT VIC AB 1 CT1 27XBRD ANBCTR (SUTURE) ×1 IMPLANT
SUT VIC AB 2-0 CT1 27 (SUTURE) ×1
SUT VIC AB 2-0 CT1 TAPERPNT 27 (SUTURE) ×1 IMPLANT
SUT VLOC 180 0 24IN GS25 (SUTURE) ×2 IMPLANT
SYR 50ML LL SCALE MARK (SYRINGE) ×2 IMPLANT
TOWEL OR 17X24 6PK STRL BLUE (TOWEL DISPOSABLE) ×2 IMPLANT
TOWEL OR 17X26 10 PK STRL BLUE (TOWEL DISPOSABLE) ×2 IMPLANT
TRAY FOLEY CATH 16FR SILVER (SET/KITS/TRAYS/PACK) IMPLANT
WATER STERILE IRR 1000ML POUR (IV SOLUTION) ×6 IMPLANT

## 2015-03-27 NOTE — Discharge Instructions (Signed)
°Dr. Novalyn Lajara °Joint Replacement Specialist °[facility] °[address] °[place], [place] [address] °[phone] ° ° °TOTAL HIP REPLACEMENT POSTOPERATIVE DIRECTIONS ° ° ° °Hip Rehabilitation, Guidelines Following Surgery  ° °WEIGHT BEARING °Weight bearing as tolerated with assist device (walker, cane, etc) as directed, use it as long as suggested by your surgeon or therapist, typically at least 4-6 weeks. ° °The results of a hip operation are greatly improved after range of motion and muscle strengthening exercises. Follow all safety measures which are given to protect your hip. If any of these exercises cause increased pain or swelling in your joint, decrease the amount until you are comfortable again. Then slowly increase the exercises. Call your caregiver if you have problems or questions.  ° °HOME CARE INSTRUCTIONS  °Most of the following instructions are designed to prevent the dislocation of your new hip.  °Remove items at home which could result in a fall. This includes throw rugs or furniture in walking pathways.  °Continue medications as instructed at time of discharge. °· You may have some home medications which will be placed on hold until you complete the course of blood thinner medication. °· You may start showering once you are discharged home. Do not remove your dressing. °Do not put on socks or shoes without following the instructions of your caregivers.   °Sit on chairs with arms. Use the chair arms to help push yourself up when arising.  °Arrange for the use of a toilet seat elevator so you are not sitting low.  °· Walk with walker as instructed.  °You may resume a sexual relationship in one month or when given the OK by your caregiver.  °Use walker as long as suggested by your caregivers.  °You may put full weight on your legs and walk as much as is comfortable. °Avoid periods of inactivity such as sitting longer than an hour when not asleep. This helps prevent  blood clots.  °You may return to work once you are cleared by your surgeon.  °Do not drive a car for 6 weeks or until released by your surgeon.  °Do not drive while taking narcotics.  °Wear elastic stockings for two weeks following surgery during the day but you may remove then at night.  °Make sure you keep all of your appointments after your operation with all of your doctors and caregivers. You should call the office at the above phone number and make an appointment for approximately two weeks after the date of your surgery. °Please pick up a stool softener and laxative for home use as long as you are requiring pain medications. °· ICE to the affected hip every three hours for 30 minutes at a time and then as needed for pain and swelling. Continue to use ice on the hip for pain and swelling from surgery. You may notice swelling that will progress down to the foot and ankle.  This is normal after surgery.  Elevate the leg when you are not up walking on it.   °It is important for you to complete the blood thinner medication as prescribed by your doctor. °· Continue to use the breathing machine which will help keep your temperature down.  It is common for your temperature to cycle up and down following surgery, especially at night when you are not up moving around and exerting yourself.  The breathing machine keeps your lungs expanded and your temperature down. ° °RANGE OF MOTION AND STRENGTHENING EXERCISES  °These exercises are   designed to help you keep full movement of your hip joint. Follow your caregiver's or physical therapist's instructions. Perform all exercises about fifteen times, three times per day or as directed. Exercise both hips, even if you have had only one joint replacement. These exercises can be done on a training (exercise) mat, on the floor, on a table or on a bed. Use whatever works the best and is most comfortable for you. Use music or television while you are exercising so that the exercises  are a pleasant break in your day. This will make your life better with the exercises acting as a break in routine you can look forward to.  °Lying on your back, slowly slide your foot toward your buttocks, raising your knee up off the floor. Then slowly slide your foot back down until your leg is straight again.  °Lying on your back spread your legs as far apart as you can without causing discomfort.  °Lying on your side, raise your upper leg and foot straight up from the floor as far as is comfortable. Slowly lower the leg and repeat.  °Lying on your back, tighten up the muscle in the front of your thigh (quadriceps muscles). You can do this by keeping your leg straight and trying to raise your heel off the floor. This helps strengthen the largest muscle supporting your knee.  °Lying on your back, tighten up the muscles of your buttocks both with the legs straight and with the knee bent at a comfortable angle while keeping your heel on the floor.  ° °SKILLED REHAB INSTRUCTIONS: °If the patient is transferred to a skilled rehab facility following release from the hospital, a list of the current medications will be sent to the facility for the patient to continue.  When discharged from the skilled rehab facility, please have the facility set up the patient's Home Health Physical Therapy prior to being released. Also, the skilled facility will be responsible for providing the patient with their medications at time of release from the facility to include their pain medication and their blood thinner medication. If the patient is still at the rehab facility at time of the two week follow up appointment, the skilled rehab facility will also need to assist the patient in arranging follow up appointment in our office and any transportation needs. ° °MAKE SURE YOU:  °Understand these instructions.  °Will watch your condition.  °Will get help right away if you are not doing well or get worse. ° °Pick up stool softner and  laxative for home use following surgery while on pain medications. °Do not remove your dressing. °The dressing is waterproof--it is OK to take showers. °Continue to use ice for pain and swelling after surgery. °Do not use any lotions or creams on the incision until instructed by your surgeon. °Total Hip Protocol. ° ° °

## 2015-03-27 NOTE — Anesthesia Postprocedure Evaluation (Signed)
Anesthesia Post Note  Patient: John [name]  Procedure(s) Performed: Procedure(s) (LRB): LEFT TOTAL HIP ARTHROPLASTY ANTERIOR APPROACH (Left)  Anesthesia type: MAC/SAB  Patient location: PACU  Post pain: Pain level controlled  Post assessment: Patient's Cardiovascular Status Stable, SAB receding  Last Vitals:  Filed Vitals:   03/27/15 2045  BP: 162/99  Pulse: 77  Temp:   Resp: 12    Post vital signs: Reviewed and stable  Level of consciousness: sedated  Complications: No apparent anesthesia complications

## 2015-03-27 NOTE — Anesthesia Procedure Notes (Signed)
Spinal Patient location during procedure: OR Staffing Anesthesiologist: Nolon Nations Performed by: anesthesiologist  Preanesthetic Checklist Completed: patient identified, site marked, surgical consent, pre-op evaluation, timeout performed, IV checked, risks and benefits discussed and monitors and equipment checked Spinal Block Patient position: sitting Prep: Betadine Patient monitoring: heart rate, continuous pulse ox and blood pressure Location: L3-4 Injection technique: single-shot Needle Needle type: Sprotte  Needle gauge: 24 G Needle length: 9 cm Additional Notes Expiration date of kit checked and confirmed. Patient tolerated procedure well, without complications.

## 2015-03-27 NOTE — H&P (View-Only) (Signed)
TOTAL HIP ADMISSION H&P  Patient is admitted for left total hip arthroplasty.  Subjective:  Chief Complaint: left hip pain  HPI: John [name], 54 y.o. male, has a history of pain and functional disability in the left hip(s) due to arthritis and patient has failed non-surgical conservative treatments for greater than 12 weeks to include NSAID's and/or analgesics, use of assistive devices, weight reduction as appropriate and activity modification.  Onset of symptoms was gradual starting 1 years ago with gradually worsening course since that time.The patient noted no past surgery on the left hip(s).  Patient currently rates pain in the left hip at 10 out of 10 with activity. Patient has night pain, worsening of pain with activity and weight bearing, pain that interfers with activities of daily living and pain with passive range of motion. Patient has evidence of subchondral cysts, subchondral sclerosis, periarticular osteophytes and joint space narrowing by imaging studies. This condition presents safety issues increasing the risk of falls. There is no current active infection.  There are no active problems to display for this patient.  No past medical history on file.  OSA on CPAP No past surgical history on file.  none  (Not in a hospital admission) No Known Allergies  History  Substance Use Topics  . Smoking status: Not on file  . Smokeless tobacco: Not on file  . Alcohol Use: Not on file    No family history on file.   Review of Systems  Constitutional: Negative.   HENT: Negative.   Eyes: Negative.   Respiratory: Negative.   Cardiovascular: Negative.   Gastrointestinal: Negative.   Genitourinary: Negative.   Musculoskeletal: Positive for joint pain.  Skin: Negative.   Neurological: Negative.   Endo/Heme/Allergies: Negative.   Psychiatric/Behavioral: The patient is nervous/anxious.     Objective:  Physical Exam  Vitals reviewed. Constitutional: He is oriented to  person, place, and time. He appears well-developed and well-nourished.  HENT:  Head: Normocephalic and atraumatic.  Eyes: Conjunctivae and EOM are normal. Pupils are equal, round, and reactive to light.  Neck: Normal range of motion. Neck supple.  Cardiovascular: Normal rate, regular rhythm, normal heart sounds and intact distal pulses.   Respiratory: Effort normal and breath sounds normal.  GI: Soft. Bowel sounds are normal. He exhibits no distension. There is no tenderness.  Genitourinary:  deferred  Musculoskeletal:       Left hip: He exhibits decreased range of motion and decreased strength.  Neurological: He is alert and oriented to person, place, and time. He has normal reflexes.  Skin: Skin is warm and dry.  Psychiatric: He has a normal mood and affect. His behavior is normal. Judgment and thought content normal.    Vital signs in last 24 hours: @  Labs:   There is no height or weight on file to calculate BMI.   Imaging Review Plain radiographs demonstrate severe degenerative joint disease of the left hip(s). The bone quality appears to be adequate for age and reported activity level.  Assessment/Plan:  End stage arthritis, left hip(s)  The patient history, physical examination, clinical judgement of the provider and imaging studies are consistent with end stage degenerative joint disease of the left hip(s) and total hip arthroplasty is deemed medically necessary. The treatment options including medical management, injection therapy, arthroscopy and arthroplasty were discussed at length. The risks and benefits of total hip arthroplasty were presented and reviewed. The risks due to aseptic loosening, infection, stiffness, dislocation/subluxation,  thromboembolic complications and other imponderables were discussed.  The patient acknowledged the explanation, agreed to proceed with the plan and consent was signed. Patient is being admitted for inpatient treatment for  surgery, pain control, PT, OT, prophylactic antibiotics, VTE prophylaxis, progressive ambulation and ADL's and discharge planning.The patient is planning to be discharged home with home health services

## 2015-03-27 NOTE — Transfer of Care (Signed)
Immediate Anesthesia Transfer of Care Note  Patient: John [name]  Procedure(s) Performed: Procedure(s): LEFT TOTAL HIP ARTHROPLASTY ANTERIOR APPROACH (Left)  Patient Location: PACU  Anesthesia Type:Spinal  Level of Consciousness: awake, alert  and oriented  Airway & Oxygen Therapy: Patient Spontanous Breathing and Patient connected to nasal cannula oxygen  Post-op Assessment: Report given to RN and Post -op Vital signs reviewed and stable  Post vital signs: Reviewed and stable  Last Vitals:  Filed Vitals:   03/27/15 1502  BP: 174/94  Pulse:   Temp:   Resp:     Complications: No apparent anesthesia complications

## 2015-03-27 NOTE — Interval H&P Note (Signed)
History and Physical Interval Note:  03/27/2015 4:10 PM  John [name]  has presented today for surgery, with the diagnosis of left hip osteoarthritis due to Avascular Necrosis  The various methods of treatment have been discussed with the patient and family. After consideration of risks, benefits and other options for treatment, the patient has consented to  Procedure(s): LEFT TOTAL HIP ARTHROPLASTY ANTERIOR APPROACH (Left) as a surgical intervention .  The patient's history has been reviewed, patient examined, no change in status, stable for surgery.  I have reviewed the patient's chart and labs.  Questions were answered to the patient's satisfaction.     Patient noted to have elevated LFTs. Hospitalist consulted in preop holding, who recommended that we proceed with surgery.  Cache Bills, Cloyde Reams

## 2015-03-27 NOTE — Anesthesia Preprocedure Evaluation (Addendum)
Anesthesia Evaluation  Patient identified by MRN, date of birth, ID band Patient awake    Reviewed: Allergy & Precautions, NPO status , Patient's Chart, lab work & pertinent test results  Airway Mallampati: II  TM Distance: >3 FB Neck ROM: Full    Dental   Pulmonary sleep apnea ,    Pulmonary exam normal       Cardiovascular Normal cardiovascular exam    Neuro/Psych Anxiety Depression    GI/Hepatic   Endo/Other    Renal/GU      Musculoskeletal   Abdominal   Peds  Hematology   Anesthesia Other Findings   Reproductive/Obstetrics                             Anesthesia Physical Anesthesia Plan  ASA: III  Anesthesia Plan: Spinal   Post-op Pain Management:    Induction: Intravenous  Airway Management Planned: Simple Face Mask  Additional Equipment:   Intra-op Plan:   Post-operative Plan:   Informed Consent: I have reviewed the patients History and Physical, chart, labs and discussed the procedure including the risks, benefits and alternatives for the proposed anesthesia with the patient or authorized representative who has indicated his/her understanding and acceptance.     Plan Discussed with: CRNA and Surgeon  Anesthesia Plan Comments: (LFT's elevated. Probably Pain meds related. Pt doesn't feel sick doubt hepatitis. Plan proceed Spinal anesthesia.)        Anesthesia Quick Evaluation

## 2015-03-27 NOTE — Progress Notes (Signed)
[facility] Medical Consultation  John [name] HYW:[id number] DOB: Sep 17, 1960 DOA: 03/27/2015 PCP: Margaree Mackintosh, MD   Requesting physician: Dr Samson Frederic Date of consultation: 03/27/2015 Reason for consultation: Abnormal LFT's  Impression/Recommendations Active Problems:   Elevated transaminase level   OA (osteoarthritis)    1. Preoperative clearance. Patient is a pleasant 54 year old gentleman with a history of morbid obesity, obstructive sleep apnea, osteoarthritis involving his left hip who presents for elective left total hip arthroplasty. Patient is highly functional and does not appear to have acute cardiopulmonary issues. He is able to carry on with day-to-day activities without developing exertional dyspnea or chest pain. His cardiopulmonary exam was benign. Abdominal exam benign as well. Lab work reviewed as he did have an AST 159 and ALT of 260. He does not have evidence of acute decompensated liver failure however. I think he is at a low surgical risk for undergoing left hip arthroplasty. 2. Elevated transaminases. As mentioned above patient having a benign abdominal exam. I wonder about the possibility of this being related to nonalcoholic steatohepatitis given history of obesity. Patient would likely benefit from a further worked up with a right upper quadrant ultrasound. Will also need repeat LFTs. This can be managed in the outpatient setting with his primary care provider.    Chief Complaint: Left hip pain  HPI: Patient is a pleasant 54 year old gentleman with a past medical history of osteoarthritis of left hip, morbid obesity, obstructive sleep apnea, who presents today for elective left total hip arthroplasty. He complains of significant left sided hip pain that has progressively worsened despite conservative measures. Patient otherwise has no complaints. He denies exertional chest pain or shortness of breath, dizziness, lightheadedness, palpitations, abdominal  pain. He reports able to perform all activities of daily living without any issues. He states his only limiting factor has been left hip pain. He denies having a cardiac history, has been diagnosed with obstructive sleep apnea using CPAP at nighttime. He does not smoke, drinks socially. Labs showed an AST of 159 with ALT of 260. Total bilirubin was 1.4. Rest of his lab work otherwise is unremarkable.    Review of Systems:  Constitutional:  No weight loss, night sweats, Fevers, chills, fatigue.  HEENT:  No headaches, Difficulty swallowing,Tooth/dental problems,Sore throat,  No sneezing, itching, ear ache, nasal congestion, post nasal drip,  Cardio-vascular:  No chest pain, Orthopnea, PND, swelling in lower extremities, anasarca, dizziness, palpitations  GI:  No heartburn, indigestion, abdominal pain, nausea, vomiting, diarrhea, change in bowel habits, loss of appetite  Resp:  No shortness of breath with exertion or at rest. No excess mucus, no productive cough, No non-productive cough, No coughing up of blood.No change in color of mucus.No wheezing.No chest wall deformity  Skin:  no rash or lesions.  GU:  no dysuria, change in color of urine, no urgency or frequency. No flank pain.  Musculoskeletal:  No joint pain or swelling. No decreased range of motion. No back pain.  Psych:  No change in mood or affect. No depression or anxiety. No memory loss.   Past Medical History  Diagnosis Date  . Anxiety     Ativan  . Depression     history of  . Right hand fracture   . History of hypertension   . Sleep apnea     wears a CPAP  . Headache   . Chronic left hip pain    Past Surgical History  Procedure Laterality Date  . Wisdom tooth extraction  Social History:  reports that he has never smoked. He has never used smokeless tobacco. He reports that he drinks about 1.8 oz of alcohol per week. He reports that he does not use illicit drugs.  No Known Allergies History reviewed. No  pertinent family history.  Prior to Admission medications   Medication Sig Start Date End Date Taking? Authorizing Provider  baclofen (LIORESAL) 10 MG tablet Take 10-20 mg by mouth 3 (three) times daily.  in the morning,  in the afternoon,  at bedtime 02/27/15  Yes Historical Provider, MD  cetirizine (ZYRTEC) 10 MG tablet Take 10 mg by mouth daily. 02/05/15  Yes Historical Provider, MD  diclofenac (VOLTAREN) 75 MG EC tablet Take 75 mg by mouth 2 (two) times daily with a meal. 02/14/15  Yes Historical Provider, MD  LORazepam (ATIVAN) 1 MG tablet Take 0.5-1 mg by mouth 3 (three) times daily as needed. 03/10/15  Yes Historical Provider, MD   Physical Exam: Blood pressure 174/94, pulse 92, temperature 98.8 F (37.1 C), temperature source Oral, resp. rate 18, height  (1.778 m), weight 109.362 kg (241 lb 1.6 oz), SpO2 97 %. Filed Vitals:   03/27/15 1502  BP: 174/94  Pulse:   Temp:   Resp:      General:  Patient is in no acute distress, he is awake and alert oriented, states doing well currently denies any chest pain or shortness of breath  Eyes: Pupils are equal round reactive to light extraocular movement intact  Neck: No jugular venous distention  Cardiovascular: Regular rate and rhythm normal S1-S2 no murmurs rubs or gallops, no external edema  Respiratory: Normal respiratory effort, lungs are clear to auscultation bilaterally  Abdomen: Obese otherwise soft nontender nondistended, overall benign abdominal exam  Skin: No rashes  Musculoskeletal: Patient reporting left hip pain with passive and active movement of left hip  Psychiatric: Patient is awake alert oriented  Neurologic: Patient having nonfocal neurologic exam, cranial nerves II through XII grossly intact, 5 of 5 muscle strength to bilateral upper and lower extremities, no alteration to sensation  Labs on Admission:  Basic Metabolic Panel:  Recent Labs Lab 03/21/15 0902  NA 136  K 3.9  CL 104  CO2  23  GLUCOSE 128*  BUN 11  CREATININE 1.02  CALCIUM 9.2   Liver Function Tests:  Recent Labs Lab 03/21/15 0902  AST 159*  ALT 260*  ALKPHOS 59  BILITOT 1.4*  PROT 7.0  ALBUMIN 3.6   No results for input(s): LIPASE, AMYLASE in the last 168 hours. No results for input(s): AMMONIA in the last 168 hours. CBC:  Recent Labs Lab 03/21/15 0902  WBC 9.3  HGB 16.0  HCT 48.5  MCV 102.5*  PLT 203   Cardiac Enzymes: No results for input(s): CKTOTAL, CKMB, CKMBINDEX, TROPONINI in the last 168 hours. BNP: Invalid input(s): POCBNP CBG: No results for input(s): GLUCAP in the last 168 hours.  Radiological Exams on Admission: No results found.  EKG: Independently reviewed.   Time spent: 50 minutes  Jeralyn Bennett [facility] Pager [phone]  If 7PM-7AM, please contact night-coverage [email or web address] Password [place] 03/27/2015, 4:38 PM

## 2015-03-27 NOTE — Op Note (Signed)
OPERATIVE REPORT  SURGEON: Samson Frederic, MD   ASSISTANT: Leilani Able, PA-C.  PREOPERATIVE DIAGNOSIS: Left hip arthritis secondary to avascular necrosis.   POSTOPERATIVE DIAGNOSIS: Left hip arthritis secondary to avascular necrosis.   PROCEDURE: Left total hip arthroplasty, anterior approach.   IMPLANTS: DePuy Tri Lock stem, size 5, hi offset. DePuy Pinnacle Cup, size 56 mm. DePuy Altrx liner, size 56 by 36 mm, neutral. DePuy Biolox ceramic head ball, size 36 + 5 mm.  ANESTHESIA:  Spinal  ESTIMATED BLOOD LOSS: 300 mL.  ANTIBIOTICS: 2g ancef.  DRAINS: None.  COMPLICATIONS: None.   CONDITION: PACU - hemodynamically stable.Marland Kitchen   BRIEF CLINICAL NOTE: John [name] is a 54 y.o. male with a long-standing history of Left hip arthritis secondary to AVN. After failing conservative management, the patient was indicated for total hip arthroplasty. The risks, benefits, and alternatives to the procedure were explained, and the patient elected to proceed.  PROCEDURE IN DETAIL: Surgical site was marked by myself. Spinal anesthesia was obtained in the pre-op holding area. Once inside the operative room, a foley catheter was inserted. The patient was then positioned on the Hana table. All bony prominences were well padded. The hip was prepped and draped in the normal sterile surgical fashion. A time-out was called verifying side and site of surgery. The patient received IV antibiotics within 60 minutes of beginning the procedure.  The direct anterior approach to the hip was performed through the Hueter interval. Lateral femoral circumflex vessels were treated with the Auqumantys. The anterior capsule was exposed and an inverted T capsulotomy was made.The femoral neck cut was made to the level of the templated cut. A corkscrew was placed into the head and the head was removed. The femoral head was found to have eburnated bone. The head was passed to the back table and was  measured.  Acetabular exposure was achieved, and the pulvinar and labrum were excised. Sequental reaming of the acetabulum was then performed up to a size 55 mm reamer. A 56 mm cup was then opened and impacted into place at approximately 40 degrees of abduction and 20 degrees of anteversion. The final polyethylene liner was impacted into place and acetabular osteophytes were removed.   I then gained femoral exposure taking care to protect the abductors and greater trochanter. This was performed using standard external rotation, extension, and adduction. The capsule was peeled off the inner aspect of the greater trochanter, taking care to preserve the short external rotators. A cookie cutter was used to enter the femoral canal, and then the femoral canal finder was placed. Sequential broaching was performed up to a size 5. Calcar planer was used on the femoral neck remnant. I paced a hi offset neck and a trial head ball. The hip was reduced. Leg lengths and offset were checked fluoroscopically. The hip was dislocated and trial components were removed. The final implants were placed, and the hip was reduced.  Fluoroscopy was used to confirm component position and leg lengths. At 90 degrees of external rotation and full extension, the hip was stable to an anterior directed force.  The wound was copiously irrigated with a dilute betadine solution followed by normal saline. Marcaine solution was injected into the periarticular soft tissue. The wound was closed in layers using #1 Vicryl and V-Loc for the fascia, 2-0 Vicryl for the subcutaneous fat, 2-0 Monocryl for the deep dermal layer, 3-0 running Monocryl subcuticular stitch, and Dermabond for the skin. Once the glue was fully dried, an Aquacell Ag dressing  was applied. The patient was transported to the recovery room in stable condition. Sponge, needle, and instrument counts were correct at the end of the case x2. The patient tolerated the  procedure well and there were no known complications.  Please note that a surgical assistant was a medical necessity for this procedure to perform it in a safe and expeditious manner. Assistant was necessary to provide appropriate retraction of vital neurovascular structures, to prevent femoral fracture, and to allow for anatomic placement of the prosthesis.

## 2015-03-28 ENCOUNTER — Encounter (HOSPITAL_COMMUNITY): Payer: Self-pay | Admitting: Orthopedic Surgery

## 2015-03-28 LAB — CBC
HCT: 39.9 % (ref 39.0–52.0)
Hemoglobin: 12.6 g/dL — ABNORMAL LOW (ref 13.0–17.0)
MCH: 33 pg (ref 26.0–34.0)
MCHC: 31.6 g/dL (ref 30.0–36.0)
MCV: 104.5 fL — ABNORMAL HIGH (ref 78.0–100.0)
PLATELETS: 192 10*3/uL (ref 150–400)
RBC: 3.82 MIL/uL — ABNORMAL LOW (ref 4.22–5.81)
RDW: 12.7 % (ref 11.5–15.5)
WBC: 9.4 10*3/uL (ref 4.0–10.5)

## 2015-03-28 LAB — BASIC METABOLIC PANEL
Anion gap: 4 — ABNORMAL LOW (ref 5–15)
BUN: 7 mg/dL (ref 6–20)
CHLORIDE: 101 mmol/L (ref 101–111)
CO2: 30 mmol/L (ref 22–32)
Calcium: 8.4 mg/dL — ABNORMAL LOW (ref 8.9–10.3)
Creatinine, Ser: 0.97 mg/dL (ref 0.61–1.24)
GFR calc Af Amer: >60 mL/min (ref >60)
GFR calc non Af Amer: >60 mL/min (ref >60)
Glucose, Bld: 116 mg/dL — ABNORMAL HIGH (ref 65–99)
Potassium: 4.4 mmol/L (ref 3.5–5.1)
Sodium: 135 mmol/L (ref 135–145)

## 2015-03-28 NOTE — Progress Notes (Signed)
Utilization review completed. Vennie Waymire, RN, BSN. 

## 2015-03-28 NOTE — Progress Notes (Signed)
   Subjective:  Patient reports pain as mild.  Denies N/V/CP/SOB.  Objective:   VITALS:   Filed Vitals:   03/27/15 2000 03/27/15 2015 03/27/15 2030 03/27/15 2045  BP: 176/109 169/96 161/89 162/99  Pulse: 80 73 77 77  Temp:    98 F (36.7 C)  TempSrc:      Resp: 17 10 11 12   Height:      Weight:      SpO2: 100% 97% 98% 99%    ABD soft Sensation intact distally Intact pulses distally Dorsiflexion/Plantar flexion intact Incision: dressing C/D/I Compartment soft   Lab Results  Component Value Date   WBC 9.4 03/28/2015   HGB 12.6* 03/28/2015   HCT 39.9 03/28/2015   MCV 104.5* 03/28/2015   PLT 192 03/28/2015   BMET    Component Value Date/Time   NA 135 03/28/2015 0505   K 4.4 03/28/2015 0505   CL 101 03/28/2015 0505   CO2 30 03/28/2015 0505   GLUCOSE 116* 03/28/2015 0505   BUN 7 03/28/2015 0505   CREATININE 0.97 03/28/2015 0505   CALCIUM 8.4* 03/28/2015 0505   GFRNONAA >60 03/28/2015 0505   GFRAA >60 03/28/2015 0505     Assessment/Plan: 1 Day Post-Op   Principal Problem:   Degenerative joint disease of left hip Active Problems:   Elevated transaminase level   WBAT with walker ASA, SCDs, TEDs PT/OT PO pain control Elevated LFTs per hospitalist Dispo: likely home tomorrow   Ishmail Mcmanamon, Cloyde Reams 03/28/2015, 7:03 AM   Samson Frederic, MD Cell [phone]

## 2015-03-28 NOTE — Evaluation (Signed)
Physical Therapy Evaluation Patient Details Name: John [name] MRN: [id number] DOB: 02-23-1961 Today's Date: 03/28/2015   History of Present Illness  Pt is a 54 y/o M s/p L THA.  Pt's PMH includes anxiety and depression, R hand fx, and headache.  Clinical Impression  Pt is s/p L THA resulting in the deficits listed below (see PT Problem List). Pt requires min guard for sit<>stand and ambulation for safety.  Min assist provided 2/2 pain for supine>sit.  Pt will benefit from skilled PT to increase their independence and safety with mobility to allow discharge to the venue listed below.     Follow Up Recommendations Home health PT;Supervision - Intermittent    Equipment Recommendations  Rolling walker with 5" wheels;3in1 (PT)    Recommendations for Other Services OT consult     Precautions / Restrictions Precautions Precautions: Fall Precaution Comments: Direct ant approach: no hip precautions Restrictions Weight Bearing Restrictions: Yes LLE Weight Bearing: Weight bearing as tolerated      Mobility  Bed Mobility Overal bed mobility: Needs Assistance Bed Mobility: Supine to Sit     Supine to sit: Min assist     General bed mobility comments: Min assist managing LLE to EOB.  Use of bed rails, HOB flat.  Increased time and cues for technique.  Transfers Overall transfer level: Needs assistance Equipment used: Rolling walker (2 wheeled) Transfers: Sit to/from Stand Sit to Stand: Min guard         General transfer comment: Pt initially stands EOB w/o prompting and w/o RW in front of him, educated pt on importance of use of RW for support.  Therefore, min guard provided for safety.    Ambulation/Gait Ambulation/Gait assistance: Min guard Ambulation Distance (Feet): 80 Feet Assistive device: Rolling walker (2 wheeled) Gait Pattern/deviations: Step-to pattern;Step-through pattern;Antalgic;Trunk flexed;Decreased stance time - left;Decreased weight shift to left   Gait  velocity interpretation: Below normal speed for age/gender General Gait Details: Heavy WB through BUEs to offload LLE, improves slightly w/ VCs.  Pt demonstrates step through gait w/o cueing.  Dec gait speed.  Cues for proper use of RW.  Stairs            Wheelchair Mobility    Modified Rankin (Stroke Patients Only)       Balance Overall balance assessment: Needs assistance Sitting-balance support: No upper extremity supported;Feet supported Sitting balance-Leahy Scale: Good     Standing balance support: Bilateral upper extremity supported;During functional activity Standing balance-Leahy Scale: Poor Standing balance comment: relies heavily on RW for support                             Pertinent Vitals/Pain Pain Assessment: 0-10 Pain Score: 9  Pain Location: L hip Pain Descriptors / Indicators: Aching;Sore;Tightness Pain Intervention(s): Limited activity within patient's tolerance;Monitored during session;Repositioned    Home Living Family/patient expects to be discharged to:: Private residence Living Arrangements: Spouse/significant other;Other (Comment) (girlfriend and girlfriend's daughter who is an Charity fundraiser) Available Help at Discharge: Family;Available PRN/intermittently Type of Home: House (townhouse) Home Access: Stairs to enter Entrance Stairs-Rails: Can reach both Entrance Stairs-Number of Steps: 3 Home Layout: Two level;Able to live on main level with bedroom/bathroom Home Equipment: Gilmer Mor - single point      Prior Function Level of Independence: Independent with assistive device(s)         Comments: Used cane for support in the community      Hand Dominance  Extremity/Trunk Assessment   Upper Extremity Assessment: Defer to OT evaluation           Lower Extremity Assessment: LLE deficits/detail   LLE Deficits / Details: weakness and limited ROM as expected s/p L THA     Communication   Communication: No difficulties   Cognition Arousal/Alertness: Awake/alert Behavior During Therapy: WFL for tasks assessed/performed Overall Cognitive Status: Within Functional Limits for tasks assessed                      General Comments General comments (skin integrity, edema, etc.): Pt asked to don clothes he has from home and assist was provided to bring shorts onto LLE.  Pt will benefit from OT evaluation for further education on dressing techniques.    Exercises Total Joint Exercises Ankle Circles/Pumps: AROM;Both;15 reps;Supine Quad Sets: AROM;Both;5 reps;Supine Hip ABduction/ADduction: AROM;Left;10 reps;Standing Knee Flexion: AROM;Left;20 reps;Standing      Assessment/Plan    PT Assessment Patient needs continued PT services  PT Diagnosis Difficulty walking;Abnormality of gait;Generalized weakness;Acute pain   PT Problem List Decreased strength;Decreased range of motion;Decreased activity tolerance;Decreased balance;Decreased mobility;Decreased coordination;Decreased knowledge of use of DME;Decreased safety awareness;Decreased knowledge of precautions;Impaired sensation;Decreased skin integrity;Pain;Obesity  PT Treatment Interventions DME instruction;Gait training;Stair training;Functional mobility training;Therapeutic activities;Therapeutic exercise;Balance training;Neuromuscular re-education;Patient/family education;Modalities   PT Goals (Current goals can be found in the Care Plan section) Acute Rehab PT Goals Patient Stated Goal: to go home PT Goal Formulation: With patient Time For Goal Achievement: 04/04/15 Potential to Achieve Goals: Good    Frequency 7X/week   Barriers to discharge Inaccessible home environment;Decreased caregiver support 3 steps to enter home and intermittent assist    Co-evaluation               End of Session Equipment Utilized During Treatment: Gait belt Activity Tolerance: Patient tolerated treatment well;Patient limited by pain Patient left: in  chair;with call bell/phone within reach Nurse Communication: Mobility status;Precautions;Weight bearing status         Time: 1610-9604 PT Time Calculation (min) (ACUTE ONLY): 29 min   Charges:   PT Evaluation $Initial PT Evaluation Tier I: 1 Procedure PT Treatments $Gait Training: 8-22 mins   PT G CodesMichail Jewels PT, DPT [phone] Pager: [phone] 03/28/2015, 12:00 PM

## 2015-03-28 NOTE — Progress Notes (Signed)
Patient has changed his mind and does not want to wear CPAP tonight. RT instructed patient to call respiratory if he changes his mind. RT will continue to monitor.

## 2015-03-28 NOTE — Evaluation (Signed)
Occupational Therapy Evaluation Patient Details Name: John [name] MRN: [id number] DOB: 24-May-1961 Today's Date: 03/28/2015    History of Present Illness Pt is a 54 y.o. M s/p L THA.  Pt's PMH includes anxiety and depression, R hand fx, and headache.   Clinical Impression   Pt s/p above. Pt independent with ADLs, PTA (difficult but managed). Pt wanting additional session to review. Do not feel pt will need follow up OT after hospital stay.    Follow Up Recommendations  No OT follow up;Supervision - Intermittent    Equipment Recommendations  None recommended by OT    Recommendations for Other Services       Precautions / Restrictions Precautions Precautions: Fall Precaution Comments: Direct anterior approach: no hip precautions Restrictions Weight Bearing Restrictions: Yes LLE Weight Bearing: Weight bearing as tolerated      Mobility Bed Mobility               General bed mobility comments: in recliner-not assessed  Transfers Overall transfer level: Needs assistance Transfers: Sit to/from Stand Sit to Stand: Supervision             Balance No physical assist needed for balance in session. Used walker for ambulation.                         ADL Overall ADL's : Needs assistance/impaired                     Lower Body Dressing: Sit to/from stand;Minimal assistance   Toilet Transfer: Supervision/safety;Ambulation;RW (chair)       Tub/ Shower Transfer: Walk-in shower;Supervision/safety;Ambulation;Rolling walker   Functional mobility during ADLs: Supervision/safety;Rolling walker General ADL Comments: Educated on LB dressing technique. Educated on safety such as use of bag on walker, safe footwear, sitting for most of LB ADLs, rugs/items on floor, and recommended someone be with him for shower transfer. Practiced simulated shower transfer.  Educated on AE/cost.     Advice worker      Pertinent  Vitals/Pain Pain Assessment: 0-10 Pain Score: 8  Pain Location: left hip and back  Pain Descriptors / Indicators: Sore Pain Intervention(s): Monitored during session;Repositioned     Hand Dominance     Extremity/Trunk Assessment Upper Extremity Assessment Upper Extremity Assessment: Overall WFL for tasks assessed   Lower Extremity Assessment Lower Extremity Assessment: Defer to PT evaluation       Communication Communication Communication: No difficulties   Cognition Arousal/Alertness: Awake/alert Behavior During Therapy: WFL for tasks assessed/performed Overall Cognitive Status: Within Functional Limits for tasks assessed                     General Comments          Shoulder Instructions      Home Living Family/patient expects to be discharged to:: Private residence Living Arrangements: Spouse/significant other;Other (Comment) (girlfriend and girlfriend's daughter who is an Charity fundraiser) Available Help at Discharge: Family;Available PRN/intermittently Type of Home: Other(Comment) (townhouse) Home Access: Stairs to enter [employer] of Steps: 3 Entrance Stairs-Rails: Can reach both Home Layout: Two level;Able to live on main level with bedroom/bathroom     Bathroom Shower/Tub: Tub/shower unit;Walk-in shower (plans on using walk-in shower) Shower/tub characteristics: Door Bathroom Toilet: Standard (sink close by)     Home Equipment: Cane - single point;Shower seat - built in          Prior Functioning/Environment Level of  Independence: Independent with assistive device(s)        Comments: Used cane for support in the community     OT Diagnosis: Acute pain   OT Problem List: Decreased range of motion;Decreased activity tolerance;Decreased knowledge of use of DME or AE;Decreased knowledge of precautions;Pain   OT Treatment/Interventions: Self-care/ADL training;DME and/or AE instruction;Therapeutic activities;Patient/family education;Balance  training    OT Goals(Current goals can be found in the care plan section) Acute Rehab OT Goals Patient Stated Goal: not stated OT Goal Formulation: With patient Time For Goal Achievement: 04/04/15 Potential to Achieve Goals: Good ADL Goals Pt Will Perform Lower Body Dressing: with modified independence;sit to/from stand Pt Will Transfer to Toilet: with modified independence;ambulating;regular height toilet;grab bars  OT Frequency: Min 2X/week   Barriers to D/C:            Co-evaluation              End of Session Equipment Utilized During Treatment: Gait belt;Rolling walker  Activity Tolerance: Patient tolerated treatment well;Other (comment) (had just walked recently with PT) Patient left: in chair;with call bell/phone within reach   Time: 6301-6010 OT Time Calculation (min): 17 min Charges:  OT General Charges $OT Visit: 1 Procedure OT Evaluation $Initial OT Evaluation Tier I: 1 Procedure G-CodesEarlie Raveling OTR/L [phone] 03/28/2015, 4:47 PM

## 2015-03-28 NOTE — Progress Notes (Signed)
Physical Therapy Treatment Patient Details Name: John [name] MRN: [id number] DOB: 1960-10-14 Today's Date: 03/28/2015    History of Present Illness Pt is a 54 y/o M s/p L THA.  Pt's PMH includes anxiety and depression, R hand fx, and headache.    PT Comments    Improved gait mechanics w/ dec WB through BUEs and relaxing shoulders w/ min verbal cues.  Increased ambulatory endurance.  Patient needs to practice stairs next session.   Pt will benefit from continued skilled PT services to increase functional independence and safety.   Follow Up Recommendations  Home health PT;Supervision - Intermittent     Equipment Recommendations  Rolling walker with 5" wheels;3in1 (PT)    Recommendations for Other Services OT consult     Precautions / Restrictions Precautions Precautions: Fall Precaution Comments: Direct ant approach: no hip precautions Restrictions Weight Bearing Restrictions: Yes LLE Weight Bearing: Weight bearing as tolerated    Mobility  Bed Mobility               General bed mobility comments: in recliner  Transfers Overall transfer level: Needs assistance Equipment used: Rolling walker (2 wheeled) Transfers: Sit to/from Stand Sit to Stand: Supervision         General transfer comment: Supervision for safety.  Sit>stand performed correctly w/o VCs; however, when sitting down pt abandons walker and turns on LLE to sit down.  Education provided to pt about safe technique and pt verbalized understanding.  Ambulation/Gait Ambulation/Gait assistance: Supervision Ambulation Distance (Feet): 300 Feet Assistive device: Rolling walker (2 wheeled) Gait Pattern/deviations: Step-through pattern;Antalgic;Trunk flexed;Narrow base of support;Decreased stride length;Decreased weight shift to left   Gait velocity interpretation: Below normal speed for age/gender General Gait Details: Min VCs to relax shoulders and dec WB through BUEs.     Stairs             Wheelchair Mobility    Modified Rankin (Stroke Patients Only)       Balance Overall balance assessment: Needs assistance Sitting-balance support: No upper extremity supported;Feet supported Sitting balance-Leahy Scale: Good     Standing balance support: Bilateral upper extremity supported;During functional activity Standing balance-Leahy Scale: Fair                      Cognition Arousal/Alertness: Awake/alert Behavior During Therapy: WFL for tasks assessed/performed Overall Cognitive Status: Within Functional Limits for tasks assessed                      Exercises Total Joint Exercises Knee Flexion: AROM;Left;Standing;15 reps General Exercises - Lower Extremity Mini-Sqauts: Strengthening;Both;15 reps;Standing    General Comments General comments (skin integrity, edema, etc.): Asked pt about potentially switching to cane tomorrow; however, pt requested to continue using RW rather than cane as he has been using the can PTA and his R shoulder has begun to be irritated by it.      Pertinent Vitals/Pain Pain Assessment: 0-10 Pain Score: 3  Pain Location: L hip Pain Descriptors / Indicators: Aching Pain Intervention(s): Limited activity within patient's tolerance;Monitored during session;Repositioned    Home Living                      Prior Function            PT Goals (current goals can now be found in the care plan section) Acute Rehab PT Goals Patient Stated Goal: to go home PT Goal Formulation: With patient Time For Goal Achievement: 04/04/15  Potential to Achieve Goals: Good Progress towards PT goals: Progressing toward goals    Frequency  7X/week    PT Plan Current plan remains appropriate    Co-evaluation             End of Session Equipment Utilized During Treatment: Gait belt Activity Tolerance: Patient tolerated treatment well Patient left: in chair;with call bell/phone within reach     Time: 1859-0931 PT  Time Calculation (min) (ACUTE ONLY): 14 min  Charges:  $Gait Training: 8-22 mins                    G Codes:      Michail Jewels PT, [place] [phone] Pager: [phone] 03/28/2015, 3:59 PM

## 2015-03-29 LAB — CBC
HCT: 36.8 % — ABNORMAL LOW (ref 39.0–52.0)
Hemoglobin: 12.1 g/dL — ABNORMAL LOW (ref 13.0–17.0)
MCH: 33.7 pg (ref 26.0–34.0)
MCHC: 32.9 g/dL (ref 30.0–36.0)
MCV: 102.5 fL — ABNORMAL HIGH (ref 78.0–100.0)
PLATELETS: 189 10*3/uL (ref 150–400)
RBC: 3.59 MIL/uL — AB (ref 4.22–5.81)
RDW: 12.1 % (ref 11.5–15.5)
WBC: 16 10*3/uL — ABNORMAL HIGH (ref 4.0–10.5)

## 2015-03-29 MED ORDER — ASPIRIN 325 MG PO TBEC: 325.0000 mg | DELAYED_RELEASE_TABLET | Freq: Two times a day (BID) | ORAL | Status: DC

## 2015-03-29 MED ORDER — OXYCODONE HCL 5 MG PO TABS: 5.0000 mg | ORAL_TABLET | ORAL | Status: DC | PRN

## 2015-03-29 MED ORDER — SENNA 8.6 MG PO TABS: 2.0000 | ORAL_TABLET | Freq: Every day | ORAL | Status: DC

## 2015-03-29 MED ORDER — ONDANSETRON HCL 4 MG PO TABS: 4.0000 mg | ORAL_TABLET | Freq: Four times a day (QID) | ORAL | Status: DC | PRN

## 2015-03-29 MED ORDER — DOCUSATE SODIUM 100 MG PO CAPS: 100.0000 mg | ORAL_CAPSULE | Freq: Two times a day (BID) | ORAL | Status: DC

## 2015-03-29 NOTE — Care Management Note (Signed)
Case Management Note  Patient Details  Name: John [name] MRN: [id number] Date of Birth: June 01, 1961  Subjective/Objective:   55 yr old male s/p left total hip arthroplasty.                 Action/Plan: Case manager spoke with patient concerning home health and DME needs. Choice was offered. Referral was called to [place]. Patient states his girlfriend will assist him at discharge. DME has been ordered.    Expected Discharge Date:    03/29/15              Expected Discharge Plan:   Home with Home Health.  In-House Referral:  NA  Discharge planning Services  CM Consult  Post Acute Care Choice:    Choice offered to:  Patient  DME Arranged:  3-N-1, Walker rolling DME Agency:  [facility]  HH Arranged:  PT HH Agency:  [facility]  Status of Service:     Medicare Important Message Given:    Date Medicare IM Given:    Medicare IM give by:    Date Additional Medicare IM Given:    Additional Medicare Important Message give by:     If discussed at Long Length of Stay Meetings, dates discussed:    Additional Comments:  Durenda Guthrie, RN 03/29/2015, 12:23 PM

## 2015-03-29 NOTE — Progress Notes (Signed)
   Subjective:  Patient reports pain as mild.  Denies N/V/CP/SOB. Wants to go home.  Objective:   VITALS:   Filed Vitals:   03/28/15 0700 03/28/15 1339 03/28/15 2100 03/29/15 0522  BP: 134/77 146/98 142/79 135/84  Pulse: 93 96 91 83  Temp: 99.3 F (37.4 C) 98.4 F (36.9 C) 98.6 F (37 C) 98.5 F (36.9 C)  TempSrc: Oral Oral Oral Oral  Resp: 14 16 16 18   Height:      Weight:      SpO2: 95% 97% 96% 98%    ABD soft Sensation intact distally Intact pulses distally Dorsiflexion/Plantar flexion intact Incision: dressing C/D/I Compartment soft   Lab Results  Component Value Date   WBC 16.0* 03/29/2015   HGB 12.1* 03/29/2015   HCT 36.8* 03/29/2015   MCV 102.5* 03/29/2015   PLT 189 03/29/2015   BMET    Component Value Date/Time   NA 135 03/28/2015 0505   K 4.4 03/28/2015 0505   CL 101 03/28/2015 0505   CO2 30 03/28/2015 0505   GLUCOSE 116* 03/28/2015 0505   BUN 7 03/28/2015 0505   CREATININE 0.97 03/28/2015 0505   CALCIUM 8.4* 03/28/2015 0505   GFRNONAA >60 03/28/2015 0505   GFRAA >60 03/28/2015 0505     Assessment/Plan: 2 Days Post-Op   Principal Problem:   Degenerative joint disease of left hip Active Problems:   Elevated transaminase level   WBAT with walker ASA, SCDs, TEDs PT/OT PO pain control Elevated LFTs - hospitalist rec PCP f/u Dispo: d/c home   Jemario Poitras, Cloyde Reams 03/29/2015, 8:09 AM   Samson Frederic, MD Cell [phone]

## 2015-03-29 NOTE — Discharge Summary (Signed)
Physician Discharge Summary  Patient ID: John [name] MRN: [id number] DOB/AGE: 23-Dec-1960 54 y.o.  Admit date: 03/27/2015 Discharge date: 03/29/2015  Admission Diagnoses:  Degenerative joint disease of left hip  Discharge Diagnoses:  Principal Problem:   Degenerative joint disease of left hip Active Problems:   Elevated transaminase level   Past Medical History  Diagnosis Date  . Anxiety     Ativan  . Depression     history of  . Right hand fracture   . History of hypertension   . Sleep apnea     wears a CPAP  . Headache   . Chronic left hip pain     Surgeries: Procedure(s): LEFT TOTAL HIP ARTHROPLASTY ANTERIOR APPROACH on 03/27/2015   Consultants (if any):    Discharged Condition: Improved  Hospital Course: John [name] is an 54 y.o. male who was admitted 03/27/2015 with a diagnosis of Degenerative joint disease of left hip and went to the operating room on 03/27/2015 and underwent the above named procedures.    He was given perioperative antibiotics:      Anti-infectives    Start     Dose/Rate Route Frequency Ordered Stop   03/27/15 2330  ceFAZolin (ANCEF) IVPB 2 g/50 mL premix     2 g 100 mL/hr over 30 Minutes Intravenous Every 6 hours 03/27/15 2200 03/28/15 0916   03/27/15 1430  ceFAZolin (ANCEF) IVPB 2 g/50 mL premix     2 g 100 mL/hr over 30 Minutes Intravenous On call to O.R. 03/27/15 1417 03/27/15 1645    .  He was given sequential compression devices, early ambulation, and ASA  for DVT prophylaxis. He was seen by the hospitalist for elevated LFTs, who recommended outpatient follow up with his PCP.  He benefited maximally from the hospital stay and there were no complications.    Recent vital signs:  Filed Vitals:   03/29/15 0522  BP: 135/84  Pulse: 83  Temp: 98.5 F (36.9 C)  Resp: 18    Recent laboratory studies:  Lab Results  Component Value Date   HGB 12.1* 03/29/2015   HGB 12.6* 03/28/2015   HGB 16.0 03/21/2015   Lab Results   Component Value Date   WBC 16.0* 03/29/2015   PLT 189 03/29/2015   Lab Results  Component Value Date   INR 0.98 03/21/2015   Lab Results  Component Value Date   NA 135 03/28/2015   K 4.4 03/28/2015   CL 101 03/28/2015   CO2 30 03/28/2015   BUN 7 03/28/2015   CREATININE 0.97 03/28/2015   GLUCOSE 116* 03/28/2015    Discharge Medications:     Medication List    TAKE these medications        aspirin 325 MG EC tablet  Take 1 tablet (325 mg total) by mouth 2 (two) times daily after a meal.     baclofen 10 MG tablet  Commonly known as:  LIORESAL  Take 10-20 mg by mouth 3 (three) times daily.  in the morning,  in the afternoon,  at bedtime     cetirizine 10 MG tablet  Commonly known as:  ZYRTEC  Take 10 mg by mouth daily.     diclofenac 75 MG EC tablet  Commonly known as:  VOLTAREN  Take 75 mg by mouth 2 (two) times daily with a meal.     docusate sodium 100 MG capsule  Commonly known as:  COLACE  Take 1 capsule (100 mg total) by mouth 2 (two) times daily.  LORazepam 1 MG tablet  Commonly known as:  ATIVAN  Take 0.5-1 mg by mouth 3 (three) times daily as needed.     ondansetron 4 MG tablet  Commonly known as:  ZOFRAN  Take 1 tablet (4 mg total) by mouth every 6 (six) hours as needed for nausea.     oxyCODONE 5 MG immediate release tablet  Commonly known as:  Oxy IR/ROXICODONE  Take 1-2 tablets (5-10 mg total) by mouth every 3 (three) hours as needed for breakthrough pain.     senna 8.6 MG Tabs tablet  Commonly known as:  SENOKOT  Take 2 tablets (17.2 mg total) by mouth at bedtime.        Diagnostic Studies: Dg Pelvis Portable  03/27/2015   CLINICAL DATA:  Postop left hip arthroplasty.  Initial encounter.  EXAM: PORTABLE PELVIS 1-2 VIEWS  COMPARISON:  Intraoperative radiograph same date.  FINDINGS: 2004 hours. The upper pelvis is excluded. Patient is status post left total hip arthroplasty. The hardware appears well positioned. No evidence of  acute fracture or dislocation. There is mild soft tissue emphysema lateral to the left hip. Mild right hip degenerative changes noted.  IMPRESSION: No demonstrated complication following left total hip arthroplasty.   Electronically Signed   By: Carey Bullocks M.D.   On: 03/27/2015 20:12   Dg Hip Operative Unilat With Pelvis Left  03/27/2015   CLINICAL DATA:  54 year old male with history of left-sided hip fracture.  EXAM: OPERATIVE LEFT HIP (WITH PELVIS IF PERFORMED) 2 VIEWS  TECHNIQUE: Fluoroscopic spot image(s) were submitted for interpretation post-operatively.  FLUOROSCOPY TIME:  If the device does not provide the exposure index:  Fluoroscopy Time:  38 seconds  Number of Acquired Images:  2  COMPARISON:  No priors.  FINDINGS: 2 intraoperative fluoroscopic views of the pelvis and left hip demonstrate placement of left hip arthroplasty. The acetabular and femoral components of the prosthesis appear properly seated without definite periprosthetic fracture or other acute complicating features. The prosthetic femoral head appears located on both of these views (however, these are both PA views).  IMPRESSION: 1. Intraoperative documentation of left hip arthroplasty, as above, without acute complicating features.   Electronically Signed   By: Trudie Reed M.D.   On: 03/27/2015 19:19    Disposition: 01-Home or Self Care  Discharge Instructions    Call MD / Call 911    Complete by:  As directed   If you experience chest pain or shortness of breath, CALL 911 and be transported to the hospital emergency room.  If you develope a fever above 101 F, pus (white drainage) or increased drainage or redness at the wound, or calf pain, call your surgeon's office.     Constipation Prevention    Complete by:  As directed   Drink plenty of fluids.  Prune juice may be helpful.  You may use a stool softener, such as Colace (over the counter) 100 mg twice a day.  Use MiraLax (over the counter) for constipation as  needed.     Diet - low sodium heart healthy    Complete by:  As directed      Discharge instructions    Complete by:  As directed   Follow up with your primary care doctor regarding elevated liver enzymes     Driving restrictions    Complete by:  As directed   No driving for 4 weeks     Increase activity slowly as tolerated    Complete by:  As directed      Lifting restrictions    Complete by:  As directed   No lifting for 6 weeks     TED hose    Complete by:  As directed   Use stockings (TED hose) for 2 weeks on both leg(s).  You may remove them at night for sleeping.           Follow-up Information    Follow up with Paislei Dorval, Cloyde Reams, MD. Schedule an appointment as soon as possible for a visit in 2 weeks.   Specialty:  Orthopedic Surgery   Why:  For wound re-check   Contact information:   [address] [place] [address] [phone]        Signed: Garnet Koyanagi 03/29/2015, 2:54 PM

## 2015-03-29 NOTE — Progress Notes (Signed)
John [name] discharged home per MD order. Discharge instructions reviewed and discussed with patient. All questions and concerns answered. Copy of instructions and scripts given to patient. No IV.  Patient escorted to car by staff in a wheelchair. No distress noted upon discharge.   Dennard Nip R 03/29/2015 11:45 AM

## 2015-03-29 NOTE — Progress Notes (Signed)
Physical Therapy Treatment Patient Details Name: John [name] MRN: [id number] DOB: 05-Jun-1961 Today's Date: 03/29/2015    History of Present Illness Pt is a 54 y.o. M s/p L THA.  Pt's PMH includes anxiety and depression, R hand fx, and headache.    PT Comments    Patient is making good progress with PT.  From a mobility standpoint anticipate patient will be ready for DC home today.  Pt will benefit from continued skilled PT services to increase functional independence and safety.  Follow Up Recommendations  Home health PT;Supervision - Intermittent     Equipment Recommendations  Rolling walker with 5" wheels;3in1 (PT)    Recommendations for Other Services       Precautions / Restrictions Precautions Precautions: Fall Precaution Comments: Direct anterior approach: no hip precautions Restrictions Weight Bearing Restrictions: Yes LLE Weight Bearing: Weight bearing as tolerated    Mobility  Bed Mobility               General bed mobility comments: in recliner  Transfers Overall transfer level: Needs assistance Equipment used: Rolling walker (2 wheeled) Transfers: Sit to/from Stand Sit to Stand: Supervision         General transfer comment: Supervision for safety  Ambulation/Gait Ambulation/Gait assistance: Supervision Ambulation Distance (Feet): 600 Feet Assistive device: Rolling walker (2 wheeled) Gait Pattern/deviations: Step-through pattern;Antalgic;Trunk flexed   Gait velocity interpretation: at or above normal speed for age/gender General Gait Details: Min VCs to remember to stand upright.  Supervision for safety.   Stairs Stairs: Yes Stairs assistance: Supervision Stair Management: Two rails;Step to pattern;Forwards Number of Stairs: 4 General stair comments: VCs for proper technique.  Supervision for safety.  Wheelchair Mobility    Modified Rankin (Stroke Patients Only)       Balance Overall balance assessment: Needs  assistance Sitting-balance support: No upper extremity supported;Feet supported Sitting balance-Leahy Scale: Good     Standing balance support: Bilateral upper extremity supported;During functional activity Standing balance-Leahy Scale: Fair                      Cognition Arousal/Alertness: Awake/alert Behavior During Therapy: WFL for tasks assessed/performed Overall Cognitive Status: Within Functional Limits for tasks assessed                      Exercises Total Joint Exercises Knee Flexion: AROM;Both;10 reps;Standing Marching in Standing: AROM;Both;10 reps;Standing    General Comments        Pertinent Vitals/Pain Pain Assessment: 0-10 Pain Score: 4  Pain Location: L hip Pain Descriptors / Indicators: Sore Pain Intervention(s): Limited activity within patient's tolerance;Monitored during session;Repositioned    Home Living                      Prior Function            PT Goals (current goals can now be found in the care plan section) Acute Rehab PT Goals Patient Stated Goal: to go home today PT Goal Formulation: With patient Time For Goal Achievement: 04/04/15 Potential to Achieve Goals: Good Progress towards PT goals: Progressing toward goals    Frequency  7X/week    PT Plan Current plan remains appropriate    Co-evaluation             End of Session Equipment Utilized During Treatment: Gait belt Activity Tolerance: Patient tolerated treatment well Patient left: in chair;with call bell/phone within reach     Time: 0865-7846 PT Time Calculation (  min) (ACUTE ONLY): 13 min  Charges:  $Gait Training: 8-22 mins                    G Codes:      Michail Jewels PT, [place] [phone] Pager: [phone] 03/29/2015, 3:41 PM

## 2015-04-13 ENCOUNTER — Ambulatory Visit: Payer: Managed Care, Other (non HMO) | Admitting: Psychology

## 2015-04-19 ENCOUNTER — Ambulatory Visit (INDEPENDENT_AMBULATORY_CARE_PROVIDER_SITE_OTHER): Payer: Managed Care, Other (non HMO) | Admitting: Psychology

## 2015-04-19 DIAGNOSIS — F411 Generalized anxiety disorder: Secondary | ICD-10-CM | POA: Diagnosis not present

## 2015-05-17 ENCOUNTER — Ambulatory Visit (INDEPENDENT_AMBULATORY_CARE_PROVIDER_SITE_OTHER): Payer: Managed Care, Other (non HMO) | Admitting: Psychology

## 2015-05-17 DIAGNOSIS — F411 Generalized anxiety disorder: Secondary | ICD-10-CM | POA: Diagnosis not present

## 2015-06-06 ENCOUNTER — Ambulatory Visit (INDEPENDENT_AMBULATORY_CARE_PROVIDER_SITE_OTHER): Payer: Managed Care, Other (non HMO) | Admitting: Psychology

## 2015-06-06 DIAGNOSIS — F411 Generalized anxiety disorder: Secondary | ICD-10-CM | POA: Diagnosis not present

## 2015-07-17 ENCOUNTER — Ambulatory Visit (INDEPENDENT_AMBULATORY_CARE_PROVIDER_SITE_OTHER): Payer: Managed Care, Other (non HMO) | Admitting: Psychology

## 2015-07-17 DIAGNOSIS — F411 Generalized anxiety disorder: Secondary | ICD-10-CM

## 2015-07-28 ENCOUNTER — Ambulatory Visit (INDEPENDENT_AMBULATORY_CARE_PROVIDER_SITE_OTHER): Payer: Managed Care, Other (non HMO) | Admitting: Psychology

## 2015-07-28 DIAGNOSIS — F411 Generalized anxiety disorder: Secondary | ICD-10-CM | POA: Diagnosis not present

## 2015-08-21 DIAGNOSIS — G4733 Obstructive sleep apnea (adult) (pediatric): Secondary | ICD-10-CM | POA: Insufficient documentation

## 2015-08-21 DIAGNOSIS — R739 Hyperglycemia, unspecified: Secondary | ICD-10-CM | POA: Insufficient documentation

## 2015-08-21 DIAGNOSIS — R002 Palpitations: Secondary | ICD-10-CM | POA: Insufficient documentation

## 2015-08-21 DIAGNOSIS — G629 Polyneuropathy, unspecified: Secondary | ICD-10-CM | POA: Insufficient documentation

## 2015-08-21 DIAGNOSIS — G4700 Insomnia, unspecified: Secondary | ICD-10-CM | POA: Insufficient documentation

## 2015-08-21 DIAGNOSIS — J309 Allergic rhinitis, unspecified: Secondary | ICD-10-CM | POA: Insufficient documentation

## 2015-08-21 DIAGNOSIS — G4730 Sleep apnea, unspecified: Secondary | ICD-10-CM | POA: Insufficient documentation

## 2015-08-21 DIAGNOSIS — N529 Male erectile dysfunction, unspecified: Secondary | ICD-10-CM | POA: Insufficient documentation

## 2015-09-21 ENCOUNTER — Ambulatory Visit (INDEPENDENT_AMBULATORY_CARE_PROVIDER_SITE_OTHER): Payer: Managed Care, Other (non HMO) | Admitting: Psychology

## 2015-09-21 DIAGNOSIS — F411 Generalized anxiety disorder: Secondary | ICD-10-CM

## 2015-10-09 DIAGNOSIS — M8700 Idiopathic aseptic necrosis of unspecified bone: Secondary | ICD-10-CM | POA: Insufficient documentation

## 2015-10-19 ENCOUNTER — Ambulatory Visit (INDEPENDENT_AMBULATORY_CARE_PROVIDER_SITE_OTHER): Payer: Managed Care, Other (non HMO) | Admitting: Psychology

## 2015-10-19 DIAGNOSIS — F411 Generalized anxiety disorder: Secondary | ICD-10-CM | POA: Diagnosis not present

## 2015-11-01 NOTE — H&P (Signed)
John [name] is an 55 y.o. male.    Chief Complaint: right shoulder pain  HPI: Pt is a 55 y.o. male complaining of right shoulder pain for multiple years. Pain had continually increased since the beginning. X-rays in the clinic show end-stage arthritic changes of the right shoulder. Pt has tried various conservative treatments which have failed to alleviate their symptoms, including injections and therapy. Various options are discussed with the patient. Risks, benefits and expectations were discussed with the patient. Patient understand the risks, benefits and expectations and wishes to proceed with surgery.   PCP:  Margaree Mackintosh, MD  D/C Plans: Home  PMH: Past Medical History  Diagnosis Date  . Anxiety     Ativan  . Depression     history of  . Right hand fracture   . History of hypertension   . Sleep apnea     wears a CPAP  . Headache   . Chronic left hip pain     PSH: Past Surgical History  Procedure Laterality Date  . Wisdom tooth extraction    . Total hip arthroplasty Left 03/27/2015    Procedure: LEFT TOTAL HIP ARTHROPLASTY ANTERIOR APPROACH;  Surgeon: Samson Frederic, MD;  Location: [facility] OR;  Service: Orthopedics;  Laterality: Left;    Social History:  reports that he has never smoked. He has never used smokeless tobacco. He reports that he drinks about 1.8 oz of alcohol per week. He reports that he does not use illicit drugs.  Allergies:  No Known Allergies  Medications: No current facility-administered medications for this encounter.   Current Outpatient Prescriptions  Medication Sig Dispense Refill  . aspirin EC 325 MG EC tablet Take 1 tablet (325 mg total) by mouth 2 (two) times daily after a meal. 60 tablet 0  . baclofen (LIORESAL) 10 MG tablet Take 10-20 mg by mouth 3 (three) times daily. 10mg  in the morning, 10mg  in the afternoon, 20mg  at bedtime  0  . cetirizine (ZYRTEC) 10 MG tablet Take 10 mg by mouth daily.  3  . diclofenac (VOLTAREN) 75 MG EC tablet  Take 75 mg by mouth 2 (two) times daily with a meal.  0  . docusate sodium (COLACE) 100 MG capsule Take 1 capsule (100 mg total) by mouth 2 (two) times daily. 60 capsule 3  . LORazepam (ATIVAN) 1 MG tablet Take 0.5-1 mg by mouth 3 (three) times daily as needed.  0  . ondansetron (ZOFRAN) 4 MG tablet Take 1 tablet (4 mg total) by mouth every 6 (six) hours as needed for nausea. 20 tablet 0  . oxyCODONE (OXY IR/ROXICODONE) 5 MG immediate release tablet Take 1-2 tablets (5-10 mg total) by mouth every 3 (three) hours as needed for breakthrough pain. 80 tablet 0  . senna (SENOKOT) 8.6 MG TABS tablet Take 2 tablets (17.2 mg total) by mouth at bedtime. 60 each 3    No results found for this or any previous visit (from the past 48 hour(s)). No results found.  ROS: Pain with rom of the right upper extremity  Physical Exam:  Alert and oriented 55 y.o. male in no acute distress Cranial nerves 2-12 intact Cervical spine: full rom with no tenderness, nv intact distally Chest: active breath sounds bilaterally, no wheeze rhonchi or rales Heart: regular rate and rhythm, no murmur Abd: non tender non distended with active bowel sounds Hip is stable with rom  Right shoulder crepitus with rom nv intact distally No rashes or edema Strength of ER and IR  5/5 bilaterally  Assessment/Plan Assessment: right shoulder end stage osteoarthritis  Plan: Patient will undergo a right total shoulder arthroplasty by Dr. Ranell Patrick at [facility]. Risks benefits and expectations were discussed with the patient. Patient understand risks, benefits and expectations and wishes to proceed.

## 2015-11-14 ENCOUNTER — Encounter (HOSPITAL_COMMUNITY): Payer: Self-pay

## 2015-11-14 ENCOUNTER — Encounter (HOSPITAL_COMMUNITY)
Admission: RE | Admit: 2015-11-14 | Discharge: 2015-11-14 | Disposition: A | Discharge status: Home / Self Care | Payer: Managed Care, Other (non HMO) | Source: Ambulatory Visit | Attending: Orthopedic Surgery | Admitting: Orthopedic Surgery

## 2015-11-14 DIAGNOSIS — I10 Essential (primary) hypertension: Secondary | ICD-10-CM | POA: Insufficient documentation

## 2015-11-14 DIAGNOSIS — Z96642 Presence of left artificial hip joint: Secondary | ICD-10-CM | POA: Diagnosis not present

## 2015-11-14 DIAGNOSIS — G4733 Obstructive sleep apnea (adult) (pediatric): Secondary | ICD-10-CM | POA: Insufficient documentation

## 2015-11-14 DIAGNOSIS — M19011 Primary osteoarthritis, right shoulder: Secondary | ICD-10-CM | POA: Insufficient documentation

## 2015-11-14 DIAGNOSIS — F329 Major depressive disorder, single episode, unspecified: Secondary | ICD-10-CM | POA: Diagnosis not present

## 2015-11-14 DIAGNOSIS — Z79899 Other long term (current) drug therapy: Secondary | ICD-10-CM | POA: Diagnosis not present

## 2015-11-14 DIAGNOSIS — Z01818 Encounter for other preprocedural examination: Secondary | ICD-10-CM | POA: Insufficient documentation

## 2015-11-14 DIAGNOSIS — Z01812 Encounter for preprocedural laboratory examination: Secondary | ICD-10-CM | POA: Diagnosis not present

## 2015-11-14 DIAGNOSIS — F419 Anxiety disorder, unspecified: Secondary | ICD-10-CM | POA: Diagnosis not present

## 2015-11-14 HISTORY — DX: Presence of spectacles and contact lenses: Z97.3

## 2015-11-14 HISTORY — DX: Unspecified osteoarthritis, unspecified site: M19.90

## 2015-11-14 HISTORY — DX: Other seasonal allergic rhinitis: J30.2

## 2015-11-14 LAB — BASIC METABOLIC PANEL
ANION GAP: 12 (ref 5–15)
BUN: 8 mg/dL (ref 6–20)
CALCIUM: 9.3 mg/dL (ref 8.9–10.3)
CO2: 23 mmol/L (ref 22–32)
Chloride: 106 mmol/L (ref 101–111)
Creatinine, Ser: 0.98 mg/dL (ref 0.61–1.24)
Glucose, Bld: 95 mg/dL (ref 65–99)
Potassium: 4.3 mmol/L (ref 3.5–5.1)
SODIUM: 141 mmol/L (ref 135–145)

## 2015-11-14 LAB — CBC
HCT: 45.8 % (ref 39.0–52.0)
Hemoglobin: 15.5 g/dL (ref 13.0–17.0)
MCH: 33.1 pg (ref 26.0–34.0)
MCHC: 33.8 g/dL (ref 30.0–36.0)
MCV: 97.9 fL (ref 78.0–100.0)
PLATELETS: 256 10*3/uL (ref 150–400)
RBC: 4.68 MIL/uL (ref 4.22–5.81)
RDW: 12.8 % (ref 11.5–15.5)
WBC: 8.2 10*3/uL (ref 4.0–10.5)

## 2015-11-14 LAB — SURGICAL PCR SCREEN
MRSA, PCR: NEGATIVE
STAPHYLOCOCCUS AUREUS: NEGATIVE

## 2015-11-14 NOTE — Pre-Procedure Instructions (Signed)
John [name]  11/14/2015      [id number] [name], [place] - [address] [name] [address] Phone: [phone] Fax: [phone]    Your procedure is scheduled on Friday, November 24, 2015  Report to [facility] Admitting at 5:30 A.M.  Call this number if you have problems the morning of surgery:  [phone]   Remember:  Do not eat food or drink liquids after midnight Thursday, November 23, 2015  Take these medicines the morning of surgery with A SIP OF WATER : escitalopram (LEXAPRO), if needed: HYDROcodone-acetaminophen (NORCO/VICODIN) for pain, LORazepam (ATIVAN)   Stop taking Aspirin, vitamins, fish oil, and herbal medications. Do not take any NSAIDs ie: Ibuprofen, Advil, Naproxen ( Aleve), BC and Goody Powder or any medication containing Aspirin; Saturday,  November 18, 2015.  Do not wear jewelry, make-up or nail polish.  Do not wear lotions, powders, or perfumes.  You may not wear deodorant.  Do not shave 48 hours prior to surgery.  Men may shave face and neck.  Do not bring valuables to the hospital.  [facility] is not responsible for any belongings or valuables.  Contacts, dentures or bridgework may not be worn into surgery.  Leave your suitcase in the car.  After surgery it may be brought to your room.  For patients admitted to the hospital, discharge time will be determined by your treatment team.  Patients discharged the day of surgery will not be allowed to drive home.   Special instructions:  [place] - Preparing for Surgery  Before surgery, you can play an important role.  Because skin is not sterile, your skin needs to be as free of germs as possible.  You can reduce the number of germs on you skin by washing with CHG (chlorahexidine gluconate) soap before surgery.  CHG is an antiseptic cleaner which kills germs and bonds with the skin to continue killing germs even after washing.  Please DO NOT use if you have an allergy to  CHG or antibacterial soaps.  If your skin becomes reddened/irritated stop using the CHG and inform your nurse when you arrive at Short Stay.  Do not shave (including legs and underarms) for at least 48 hours prior to the first CHG shower.  You may shave your face.  Please follow these instructions carefully:   1.  Shower with CHG Soap the night before surgery and the morning of Surgery.  2.  If you choose to wash your hair, wash your hair first as usual with your normal shampoo.  3.  After you shampoo, rinse your hair and body thoroughly to remove the Shampoo.  4.  Use CHG as you would any other liquid soap.  You can apply chg directly  to the skin and wash gently with scrungie or a clean washcloth.  5.  Apply the CHG Soap to your body ONLY FROM THE NECK DOWN.  Do not use on open wounds or open sores.  Avoid contact with your eyes, ears, mouth and genitals (private parts).  Wash genitals (private parts) with your normal soap.  6.  Wash thoroughly, paying special attention to the area where your surgery will be performed.  7.  Thoroughly rinse your body with warm water from the neck down.  8.  DO NOT shower/wash with your normal soap after using and rinsing off the CHG Soap.  9.  Pat yourself dry with a clean towel.  10.  Wear clean pajamas.            11.  Place clean sheets on your bed the night of your first shower and do not sleep with pets.  Day of Surgery  Do not apply any lotions/deodorants the morning of surgery.  Please wear clean clothes to the hospital/surgery center.  Please read over the following fact sheets that you were given. Pain Booklet, Coughing and Deep Breathing, MRSA Information and Surgical Site Infection Prevention

## 2015-11-15 NOTE — Progress Notes (Signed)
Anesthesia chart review: Patient is a 55 year old male scheduled for right total shoulder arthroplasty on 11/24/2015 by Dr. Ranell Patrick.  History includes non-smoker, anxiety, depression, HTN, OSA with CPAP, left THA 03/27/15. BMI is consistent with obesity. PCP is Dr. Margaree Mackintosh [address] [facility], see Care Everywhere), who signed a note of medical clearance.   Meds include Lexapro, Norco, Ativan, Singulair.   03/22/15 EKG (PCP): ST, IVCD, non-specific ST depression. Dr. Nolen Mu reviewed and felt other that rate, there were no changes on his EKG when compared to tracing from 2011. HR was 68 bpm at PAT.   Preoperative labs noted.   Patient was medically cleared for surgery. If no acute changes then I anticipate that he can proceed as planned.  Velna [facility] Short Stay Center/Anesthesiology Phone [phone] 11/15/2015 6:38 PM

## 2015-11-23 MED ORDER — CEFAZOLIN SODIUM-DEXTROSE 2-3 GM-% IV SOLR
2.0000 g | INTRAVENOUS | Status: AC
  Administered 2015-11-24: 2 g via INTRAVENOUS
  Filled 2015-11-23: qty 50

## 2015-11-23 NOTE — Anesthesia Preprocedure Evaluation (Signed)
Anesthesia Evaluation  Patient identified by MRN, date of birth, ID band Patient awake    Reviewed: Allergy & Precautions, H&P , NPO status , Patient's Chart, lab work & pertinent test results  Airway Mallampati: II  TM Distance: >3 FB Neck ROM: Full    Dental no notable dental hx. (+) Dental Advisory Given, Teeth Intact   Pulmonary sleep apnea and Continuous Positive Airway Pressure Ventilation ,    Pulmonary exam normal breath sounds clear to auscultation       Cardiovascular Exercise Tolerance: Good negative cardio ROS Normal cardiovascular exam Rhythm:regular Rate:Normal     Neuro/Psych  Headaches, Anxiety Depression negative neurological ROS  negative psych ROS   GI/Hepatic negative GI ROS, Neg liver ROS,   Endo/Other  negative endocrine ROS  Renal/GU negative Renal ROS  negative genitourinary   Musculoskeletal   Abdominal   Peds  Hematology negative hematology ROS (+)   Anesthesia Other Findings   Reproductive/Obstetrics negative OB ROS                             Anesthesia Physical Anesthesia Plan  ASA: III  Anesthesia Plan: General   Post-op Pain Management: GA combined w/ Regional for post-op pain   Induction: Intravenous  Airway Management Planned: Oral ETT  Additional Equipment:   Intra-op Plan:   Post-operative Plan: Extubation in OR  Informed Consent: I have reviewed the patients History and Physical, chart, labs and discussed the procedure including the risks, benefits and alternatives for the proposed anesthesia with the patient or authorized representative who has indicated his/her understanding and acceptance.   Dental Advisory Given  Plan Discussed with: CRNA and Surgeon  Anesthesia Plan Comments:         Anesthesia Quick Evaluation

## 2015-11-24 ENCOUNTER — Inpatient Hospital Stay (HOSPITAL_COMMUNITY): Payer: Managed Care, Other (non HMO) | Admitting: Anesthesiology

## 2015-11-24 ENCOUNTER — Encounter (HOSPITAL_COMMUNITY)
Admission: RE | Disposition: A | Discharge status: Home Health | Payer: Self-pay | Source: Ambulatory Visit | Attending: Orthopedic Surgery

## 2015-11-24 ENCOUNTER — Encounter (HOSPITAL_COMMUNITY): Payer: Self-pay | Admitting: *Deleted

## 2015-11-24 ENCOUNTER — Inpatient Hospital Stay (HOSPITAL_COMMUNITY)
Admission: RE | Admit: 2015-11-24 | Discharge: 2015-11-25 | DRG: 483 | Disposition: A | Discharge status: Home Health | Payer: Managed Care, Other (non HMO) | Source: Ambulatory Visit | Attending: Orthopedic Surgery | Admitting: Orthopedic Surgery

## 2015-11-24 ENCOUNTER — Inpatient Hospital Stay (HOSPITAL_COMMUNITY): Payer: Managed Care, Other (non HMO)

## 2015-11-24 ENCOUNTER — Inpatient Hospital Stay (HOSPITAL_COMMUNITY): Payer: Managed Care, Other (non HMO) | Admitting: Vascular Surgery

## 2015-11-24 DIAGNOSIS — F419 Anxiety disorder, unspecified: Secondary | ICD-10-CM | POA: Diagnosis present

## 2015-11-24 DIAGNOSIS — M25511 Pain in right shoulder: Secondary | ICD-10-CM | POA: Diagnosis present

## 2015-11-24 DIAGNOSIS — Z7982 Long term (current) use of aspirin: Secondary | ICD-10-CM | POA: Diagnosis not present

## 2015-11-24 DIAGNOSIS — Z96619 Presence of unspecified artificial shoulder joint: Secondary | ICD-10-CM

## 2015-11-24 DIAGNOSIS — F329 Major depressive disorder, single episode, unspecified: Secondary | ICD-10-CM | POA: Diagnosis present

## 2015-11-24 DIAGNOSIS — M87811 Other osteonecrosis, right shoulder: Principal | ICD-10-CM | POA: Diagnosis present

## 2015-11-24 DIAGNOSIS — I10 Essential (primary) hypertension: Secondary | ICD-10-CM | POA: Diagnosis present

## 2015-11-24 DIAGNOSIS — M19011 Primary osteoarthritis, right shoulder: Secondary | ICD-10-CM | POA: Diagnosis present

## 2015-11-24 DIAGNOSIS — G4730 Sleep apnea, unspecified: Secondary | ICD-10-CM | POA: Diagnosis present

## 2015-11-24 DIAGNOSIS — G8929 Other chronic pain: Secondary | ICD-10-CM | POA: Diagnosis present

## 2015-11-24 DIAGNOSIS — M25711 Osteophyte, right shoulder: Secondary | ICD-10-CM | POA: Diagnosis present

## 2015-11-24 DIAGNOSIS — Z96611 Presence of right artificial shoulder joint: Secondary | ICD-10-CM

## 2015-11-24 HISTORY — PX: TOTAL SHOULDER ARTHROPLASTY: SHX126

## 2015-11-24 SURGERY — ARTHROPLASTY, SHOULDER, TOTAL
Anesthesia: General | Site: Shoulder | Laterality: Right

## 2015-11-24 MED ORDER — ADULT MULTIVITAMIN W/MINERALS CH
1.0000 | ORAL_TABLET | Freq: Every day | ORAL | Status: DC
  Administered 2015-11-25: 1 via ORAL
  Filled 2015-11-24: qty 1

## 2015-11-24 MED ORDER — SUCCINYLCHOLINE CHLORIDE 20 MG/ML IJ SOLN
INTRAMUSCULAR | Status: AC
  Filled 2015-11-24: qty 1

## 2015-11-24 MED ORDER — PHENOL 1.4 % MT LIQD
1.0000 | OROMUCOSAL | Status: DC | PRN
Start: 2015-11-24 — End: 2015-11-25

## 2015-11-24 MED ORDER — HEMOSTATIC AGENTS (NO CHARGE) OPTIME
TOPICAL | Status: DC | PRN
  Administered 2015-11-24: 1 via TOPICAL

## 2015-11-24 MED ORDER — ACETAMINOPHEN 325 MG PO TABS
650.0000 mg | ORAL_TABLET | Freq: Four times a day (QID) | ORAL | Status: DC | PRN
  Administered 2015-11-25: 650 mg via ORAL
  Filled 2015-11-24: qty 2

## 2015-11-24 MED ORDER — DOCUSATE SODIUM 100 MG PO CAPS
100.0000 mg | ORAL_CAPSULE | Freq: Two times a day (BID) | ORAL | Status: DC
  Administered 2015-11-24 – 2015-11-25 (×2): 100 mg via ORAL
  Filled 2015-11-24 (×2): qty 1

## 2015-11-24 MED ORDER — METHOCARBAMOL 500 MG PO TABS
500.0000 mg | ORAL_TABLET | Freq: Four times a day (QID) | ORAL | Status: DC | PRN
  Administered 2015-11-24 – 2015-11-25 (×2): 500 mg via ORAL
  Filled 2015-11-24 (×2): qty 1

## 2015-11-24 MED ORDER — BUPIVACAINE-EPINEPHRINE 0.25% -1:200000 IJ SOLN
INTRAMUSCULAR | Status: DC | PRN
  Administered 2015-11-24: 10 mL

## 2015-11-24 MED ORDER — HYDROMORPHONE HCL 1 MG/ML IJ SOLN: 0.2500 mg | INTRAMUSCULAR | Status: DC | PRN

## 2015-11-24 MED ORDER — PROPOFOL 10 MG/ML IV BOLUS
INTRAVENOUS | Status: AC
  Filled 2015-11-24: qty 20

## 2015-11-24 MED ORDER — FENTANYL CITRATE (PF) 250 MCG/5ML IJ SOLN
INTRAMUSCULAR | Status: AC
  Filled 2015-11-24: qty 5

## 2015-11-24 MED ORDER — ROCURONIUM BROMIDE 50 MG/5ML IV SOLN
INTRAVENOUS | Status: AC
  Filled 2015-11-24: qty 1

## 2015-11-24 MED ORDER — MENTHOL 3 MG MT LOZG: 1.0000 | LOZENGE | OROMUCOSAL | Status: DC | PRN

## 2015-11-24 MED ORDER — FENTANYL CITRATE (PF) 100 MCG/2ML IJ SOLN
INTRAMUSCULAR | Status: DC | PRN
Start: 2015-11-24 — End: 2015-11-24
  Administered 2015-11-24: 100 ug via INTRAVENOUS
  Administered 2015-11-24: 150 ug via INTRAVENOUS

## 2015-11-24 MED ORDER — ONDANSETRON HCL 4 MG/2ML IJ SOLN
INTRAMUSCULAR | Status: AC
  Filled 2015-11-24: qty 2

## 2015-11-24 MED ORDER — POLYETHYLENE GLYCOL 3350 17 G PO PACK: 17.0000 g | PACK | Freq: Every day | ORAL | Status: DC | PRN

## 2015-11-24 MED ORDER — PROPOFOL 10 MG/ML IV BOLUS
INTRAVENOUS | Status: DC | PRN
  Administered 2015-11-24: 260 mg via INTRAVENOUS

## 2015-11-24 MED ORDER — LACTATED RINGERS IV SOLN
INTRAVENOUS | Status: DC | PRN
  Administered 2015-11-24 (×2): via INTRAVENOUS

## 2015-11-24 MED ORDER — CEFAZOLIN SODIUM-DEXTROSE 2-3 GM-% IV SOLR
2.0000 g | Freq: Four times a day (QID) | INTRAVENOUS | Status: AC
  Administered 2015-11-24 – 2015-11-25 (×3): 2 g via INTRAVENOUS
  Filled 2015-11-24 (×4): qty 50

## 2015-11-24 MED ORDER — CHLORHEXIDINE GLUCONATE 4 % EX LIQD: 60.0000 mL | Freq: Once | CUTANEOUS | Status: DC

## 2015-11-24 MED ORDER — METHOCARBAMOL 1000 MG/10ML IJ SOLN
500.0000 mg | Freq: Four times a day (QID) | INTRAMUSCULAR | Status: DC | PRN
  Filled 2015-11-24: qty 5

## 2015-11-24 MED ORDER — THROMBIN 5000 UNITS EX SOLR
CUTANEOUS | Status: AC
  Filled 2015-11-24: qty 5000

## 2015-11-24 MED ORDER — MIDAZOLAM HCL 2 MG/2ML IJ SOLN
INTRAMUSCULAR | Status: AC
  Filled 2015-11-24: qty 2

## 2015-11-24 MED ORDER — HYDROCODONE-ACETAMINOPHEN 5-325 MG PO TABS: 1.0000 | ORAL_TABLET | Freq: Four times a day (QID) | ORAL | Status: DC | PRN

## 2015-11-24 MED ORDER — ONDANSETRON HCL 4 MG PO TABS: 4.0000 mg | ORAL_TABLET | Freq: Four times a day (QID) | ORAL | Status: DC | PRN

## 2015-11-24 MED ORDER — EPHEDRINE SULFATE 50 MG/ML IJ SOLN
INTRAMUSCULAR | Status: AC
  Filled 2015-11-24: qty 1

## 2015-11-24 MED ORDER — ARTIFICIAL TEARS OP OINT
TOPICAL_OINTMENT | OPHTHALMIC | Status: AC
  Filled 2015-11-24: qty 3.5

## 2015-11-24 MED ORDER — METHOCARBAMOL 500 MG PO TABS: 500.0000 mg | ORAL_TABLET | Freq: Three times a day (TID) | ORAL | Status: DC | PRN

## 2015-11-24 MED ORDER — ACETAMINOPHEN 650 MG RE SUPP: 650.0000 mg | Freq: Four times a day (QID) | RECTAL | Status: DC | PRN

## 2015-11-24 MED ORDER — METOCLOPRAMIDE HCL 5 MG PO TABS: 5.0000 mg | ORAL_TABLET | Freq: Three times a day (TID) | ORAL | Status: DC | PRN

## 2015-11-24 MED ORDER — ESCITALOPRAM OXALATE 10 MG PO TABS
10.0000 mg | ORAL_TABLET | Freq: Every day | ORAL | Status: DC
  Administered 2015-11-24 – 2015-11-25 (×2): 10 mg via ORAL
  Filled 2015-11-24 (×2): qty 1

## 2015-11-24 MED ORDER — OXYCODONE-ACETAMINOPHEN 5-325 MG PO TABS: 1.0000 | ORAL_TABLET | ORAL | Status: DC | PRN

## 2015-11-24 MED ORDER — EPHEDRINE SULFATE 50 MG/ML IJ SOLN
INTRAMUSCULAR | Status: DC | PRN
  Administered 2015-11-24: 5 mg via INTRAVENOUS
  Administered 2015-11-24: 10 mg via INTRAVENOUS
  Administered 2015-11-24: 5 mg via INTRAVENOUS

## 2015-11-24 MED ORDER — LORAZEPAM 0.5 MG PO TABS
0.5000 mg | ORAL_TABLET | Freq: Three times a day (TID) | ORAL | Status: DC | PRN
  Administered 2015-11-25: 1 mg via ORAL
  Filled 2015-11-24: qty 2

## 2015-11-24 MED ORDER — MONTELUKAST SODIUM 10 MG PO TABS
10.0000 mg | ORAL_TABLET | Freq: Every day | ORAL | Status: DC
  Administered 2015-11-24 – 2015-11-25 (×2): 10 mg via ORAL
  Filled 2015-11-24 (×2): qty 1

## 2015-11-24 MED ORDER — LACTATED RINGERS IV SOLN: INTRAVENOUS | Status: DC

## 2015-11-24 MED ORDER — MIDAZOLAM HCL 5 MG/5ML IJ SOLN
INTRAMUSCULAR | Status: DC | PRN
  Administered 2015-11-24: 2 mg via INTRAVENOUS

## 2015-11-24 MED ORDER — 0.9 % SODIUM CHLORIDE (POUR BTL) OPTIME
TOPICAL | Status: DC | PRN
  Administered 2015-11-24: 1000 mL

## 2015-11-24 MED ORDER — ARTIFICIAL TEARS OP OINT
TOPICAL_OINTMENT | OPHTHALMIC | Status: DC | PRN
  Administered 2015-11-24: 1 via OPHTHALMIC

## 2015-11-24 MED ORDER — LIDOCAINE HCL (CARDIAC) 20 MG/ML IV SOLN
INTRAVENOUS | Status: DC | PRN
  Administered 2015-11-24: 40 mg via INTRAVENOUS

## 2015-11-24 MED ORDER — SODIUM CHLORIDE 0.9 % IJ SOLN
INTRAMUSCULAR | Status: AC
  Filled 2015-11-24: qty 10

## 2015-11-24 MED ORDER — ONDANSETRON HCL 4 MG/2ML IJ SOLN
INTRAMUSCULAR | Status: DC | PRN
  Administered 2015-11-24: 4 mg via INTRAVENOUS

## 2015-11-24 MED ORDER — SODIUM CHLORIDE 0.9 % IV SOLN
INTRAVENOUS | Status: DC
  Administered 2015-11-24: 17:00:00 via INTRAVENOUS

## 2015-11-24 MED ORDER — ONDANSETRON HCL 4 MG/2ML IJ SOLN: 4.0000 mg | Freq: Four times a day (QID) | INTRAMUSCULAR | Status: DC | PRN

## 2015-11-24 MED ORDER — BUPIVACAINE-EPINEPHRINE (PF) 0.25% -1:200000 IJ SOLN
INTRAMUSCULAR | Status: AC
  Filled 2015-11-24: qty 30

## 2015-11-24 MED ORDER — METOCLOPRAMIDE HCL 5 MG/ML IJ SOLN: 5.0000 mg | Freq: Three times a day (TID) | INTRAMUSCULAR | Status: DC | PRN

## 2015-11-24 MED ORDER — LIDOCAINE HCL (CARDIAC) 20 MG/ML IV SOLN
INTRAVENOUS | Status: AC
  Filled 2015-11-24: qty 5

## 2015-11-24 MED ORDER — HYDROMORPHONE HCL 1 MG/ML IJ SOLN
1.0000 mg | INTRAMUSCULAR | Status: DC | PRN
  Administered 2015-11-25 (×2): 1 mg via INTRAVENOUS
  Filled 2015-11-24 (×2): qty 1

## 2015-11-24 MED ORDER — OXYCODONE HCL 5 MG PO TABS
5.0000 mg | ORAL_TABLET | ORAL | Status: DC | PRN
  Administered 2015-11-24 – 2015-11-25 (×6): 10 mg via ORAL
  Filled 2015-11-24 (×6): qty 2

## 2015-11-24 MED ORDER — THROMBIN 5000 UNITS EX SOLR
CUTANEOUS | Status: DC | PRN
  Administered 2015-11-24: 5000 [IU] via TOPICAL

## 2015-11-24 MED ORDER — BUPIVACAINE-EPINEPHRINE (PF) 0.5% -1:200000 IJ SOLN
INTRAMUSCULAR | Status: DC | PRN
  Administered 2015-11-24: 30 mL via PERINEURAL

## 2015-11-24 SURGICAL SUPPLY — 71 items
BLADE SAW SAG 73X25 THK (BLADE) ×1
BLADE SAW SGTL 73X25 THK (BLADE) ×1 IMPLANT
BUR SURG 4X8 MED (BURR) IMPLANT
BURR SURG 4X8 MED (BURR)
CAPT SHLDR TOTAL 2 ×2 IMPLANT
CEMENT BONE DEPUY (Cement) ×2 IMPLANT
COVER SURGICAL LIGHT HANDLE (MISCELLANEOUS) ×4 IMPLANT
DRAPE IMP U-DRAPE 54X76 (DRAPES) ×2 IMPLANT
DRAPE INCISE IOBAN 66X45 STRL (DRAPES) ×6 IMPLANT
DRAPE U-SHAPE 47X51 STRL (DRAPES) ×2 IMPLANT
DRAPE X-RAY CASS 24X20 (DRAPES) IMPLANT
DRILL BIT 5/64 (BIT) ×2 IMPLANT
DRSG ADAPTIC 3X8 NADH LF (GAUZE/BANDAGES/DRESSINGS) ×2 IMPLANT
DRSG PAD ABDOMINAL 8X10 ST (GAUZE/BANDAGES/DRESSINGS) ×4 IMPLANT
DURAPREP 26ML APPLICATOR (WOUND CARE) ×2 IMPLANT
ELECT BLADE 4.0 EZ CLEAN MEGAD (MISCELLANEOUS) ×2
ELECT NEEDLE TIP 2.8 STRL (NEEDLE) ×2 IMPLANT
ELECT REM PT RETURN 9FT ADLT (ELECTROSURGICAL) ×2
ELECTRODE BLDE 4.0 EZ CLN MEGD (MISCELLANEOUS) ×1 IMPLANT
ELECTRODE REM PT RTRN 9FT ADLT (ELECTROSURGICAL) ×1 IMPLANT
GAUZE SPONGE 4X4 12PLY STRL (GAUZE/BANDAGES/DRESSINGS) ×2 IMPLANT
GLOVE BIOGEL PI ORTHO PRO 7.5 (GLOVE) ×1
GLOVE BIOGEL PI ORTHO PRO SZ8 (GLOVE) ×1
GLOVE ORTHO TXT STRL SZ7.5 (GLOVE) ×2 IMPLANT
GLOVE PI ORTHO PRO STRL 7.5 (GLOVE) ×1 IMPLANT
GLOVE PI ORTHO PRO STRL SZ8 (GLOVE) ×1 IMPLANT
GLOVE SURG ORTHO 8.5 STRL (GLOVE) ×4 IMPLANT
GOWN STRL REUS W/ TWL XL LVL3 (GOWN DISPOSABLE) ×3 IMPLANT
GOWN STRL REUS W/TWL XL LVL3 (GOWN DISPOSABLE) ×3
HANDPIECE INTERPULSE COAX TIP (DISPOSABLE)
KIT BASIN OR (CUSTOM PROCEDURE TRAY) ×2 IMPLANT
KIT ROOM TURNOVER OR (KITS) ×2 IMPLANT
MANIFOLD NEPTUNE II (INSTRUMENTS) ×2 IMPLANT
NDL SUT 6 .5 CRC .975X.05 MAYO (NEEDLE) ×1 IMPLANT
NEEDLE 1/2 CIR MAYO (NEEDLE) ×2 IMPLANT
NEEDLE HYPO 25GX1X1/2 BEV (NEEDLE) ×2 IMPLANT
NEEDLE MAYO TAPER (NEEDLE) ×1
NS IRRIG 1000ML POUR BTL (IV SOLUTION) ×2 IMPLANT
PACK SHOULDER (CUSTOM PROCEDURE TRAY) ×2 IMPLANT
PACK UNIVERSAL I (CUSTOM PROCEDURE TRAY) IMPLANT
PAD ARMBOARD 7.5X6 YLW CONV (MISCELLANEOUS) ×4 IMPLANT
PIN METAGLENE 2.5 (PIN) IMPLANT
SET HNDPC FAN SPRY TIP SCT (DISPOSABLE) IMPLANT
SLING ARM FOAM STRAP LRG (SOFTGOODS) ×2 IMPLANT
SLING ARM IMMOBILIZER LRG (SOFTGOODS) ×2 IMPLANT
SLING ARM IMMOBILIZER MED (SOFTGOODS) IMPLANT
SMARTMIX MINI TOWER (MISCELLANEOUS) ×2
SPONGE GAUZE 4X4 12PLY STER LF (GAUZE/BANDAGES/DRESSINGS) ×2 IMPLANT
SPONGE LAP 18X18 X RAY DECT (DISPOSABLE) ×2 IMPLANT
SPONGE LAP 4X18 X RAY DECT (DISPOSABLE) ×2 IMPLANT
SPONGE SURGIFOAM ABS GEL SZ50 (HEMOSTASIS) ×2 IMPLANT
STRIP CLOSURE SKIN 1/2X4 (GAUZE/BANDAGES/DRESSINGS) ×2 IMPLANT
SUCTION FRAZIER HANDLE 10FR (MISCELLANEOUS) ×1
SUCTION TUBE FRAZIER 10FR DISP (MISCELLANEOUS) ×1 IMPLANT
SUT FIBERWIRE #2 38 T-5 BLUE (SUTURE) ×6
SUT MNCRL AB 4-0 PS2 18 (SUTURE) ×2 IMPLANT
SUT VIC AB 0 CT1 27 (SUTURE) ×2
SUT VIC AB 0 CT1 27XBRD ANBCTR (SUTURE) ×2 IMPLANT
SUT VIC AB 0 CT2 27 (SUTURE) ×2 IMPLANT
SUT VIC AB 2-0 CT1 27 (SUTURE) ×1
SUT VIC AB 2-0 CT1 TAPERPNT 27 (SUTURE) ×1 IMPLANT
SUT VICRYL AB 2 0 TIES (SUTURE) ×2 IMPLANT
SUTURE FIBERWR #2 38 T-5 BLUE (SUTURE) ×3 IMPLANT
SYR CONTROL 10ML LL (SYRINGE) ×2 IMPLANT
TAPE CLOTH SURG 6X10 WHT LF (GAUZE/BANDAGES/DRESSINGS) ×2 IMPLANT
TOWEL OR 17X24 6PK STRL BLUE (TOWEL DISPOSABLE) ×2 IMPLANT
TOWEL OR 17X26 10 PK STRL BLUE (TOWEL DISPOSABLE) ×2 IMPLANT
TOWER SMARTMIX MINI (MISCELLANEOUS) ×1 IMPLANT
TRAY FOLEY CATH 16FRSI W/METER (SET/KITS/TRAYS/PACK) IMPLANT
WATER STERILE IRR 1000ML POUR (IV SOLUTION) ×2 IMPLANT
YANKAUER SUCT BULB TIP NO VENT (SUCTIONS) IMPLANT

## 2015-11-24 NOTE — Brief Op Note (Signed)
11/24/2015  10:34 AM  PATIENT:  John [name]  55 y.o. male  PRE-OPERATIVE DIAGNOSIS:  RIGHT SHOULDER AVASCULAR NECROSIS  POST-OPERATIVE DIAGNOSIS:  RIGHT SHOULDER AVASCULAR NECROSIS  PROCEDURE:  Procedure(s): RIGHT TOTAL SHOULDER ARTHROPLASTY (Right) [employer]  SURGEON:  Surgeon(s) and Role:    * Beverely Low, MD - Primary  PHYSICIAN ASSISTANT:   ASSISTANTS: Thea Gist, PA-C   ANESTHESIA:   regional and general  EBL:  Total I/O In: 1000 [I.V.:1000] Out: 150 [Blood:150]  BLOOD ADMINISTERED:none  DRAINS: none   LOCAL MEDICATIONS USED:  MARCAINE     SPECIMEN:  No Specimen  DISPOSITION OF SPECIMEN:  N/A  COUNTS:  YES  TOURNIQUET:  * No tourniquets in log *  DICTATION: .Dragon Dictation and Other Dictation: Dictation Number [id number]  PLAN OF CARE: Admit to inpatient   PATIENT DISPOSITION:  PACU - hemodynamically stable.   Delay start of Pharmacological VTE agent (>24hrs) due to surgical blood loss or risk of bleeding: no

## 2015-11-24 NOTE — Interval H&P Note (Signed)
History and Physical Interval Note:  11/24/2015 7:29 AM  John [name]  has presented today for surgery, with the diagnosis of RIGHT SHOULDER AVASCULAR NACROSIS  The various methods of treatment have been discussed with the patient and family. After consideration of risks, benefits and other options for treatment, the patient has consented to  Procedure(s): RIGHT TOTAL SHOULDER ARTHROPLASTY (Right) as a surgical intervention .  The patient's history has been reviewed, patient examined, no change in status, stable for surgery.  I have reviewed the patient's chart and labs.  Questions were answered to the patient's satisfaction.     Denis Koppel,STEVEN R

## 2015-11-24 NOTE — Discharge Instructions (Signed)
Ice to the shoulder constantly.  Wear sling while up moving around and out of the home. Ok to remove the sling in the home, hug a pillow to rest the arm. Keep the arm propped forward so the arm is across your waist.  (make sure that you can look down and see your elbow) No heavy use of the right arm.  Light activity of daily living is ok. - hand to face, eating etc.  Keep incision covered and clean and dry for one week, then ok to get wet in the shower.  Follow up in two weeks in the office  [phone]

## 2015-11-24 NOTE — Op Note (Signed)
[name], [name]            ACCOUNT NO.:  [id number]  MEDICAL RECORD NO.:  [id number]  LOCATION:  [facility]                         FACILITY:  [facility]  PHYSICIAN:  Almedia Balls. Ranell Patrick, M.D. DATE OF BIRTH:  [birth year]  DATE OF PROCEDURE:  11/24/2015 DATE OF DISCHARGE:                              OPERATIVE REPORT   PREOPERATIVE DIAGNOSIS:  Right shoulder avascular necrosis and arthritis.  POSTOPERATIVE DIAGNOSIS:  Right shoulder avascular necrosis and arthritis.  PROCEDURE PERFORMED:  Right total shoulder arthroplasty using DePuy Global Unite system.  ATTENDING SURGEON:  Almedia Balls. Ranell Patrick, M.D.  ASSISTANT:  Donnie Coffin. Dixon, PA-C, who was scrubbed the entire procedure and necessary for satisfactory completion of surgery.  ANESTHESIA:  General anesthesia was used plus interscalene block.  ESTIMATED BLOOD LOSS:  Less than 100 mL.  FLUID REPLACEMENT:  1500 mL crystalloid.  INSTRUMENT COUNTS:  Correct.  COMPLICATIONS:  There were no complications.  ANTIBIOTICS:  Perioperative antibiotics were given.  INDICATIONS:  The patient is a 55 year old male with worsening right shoulder pain secondary to end-stage arthritis due to AVN and collapse of the humeral head.  The patient presents with progressive pain despite conservative management desiring operative treatment to restore function to his shoulder and relieve pain.  Informed consent obtained.  DESCRIPTION OF PROCEDURE:  After an adequate level of anesthesia achieved, the patient was positioned in the modified beach-chair position.  Right shoulder was correctly identified and sterilely prepped and draped in the usual manner.  Time-out was called.  We initiated surgery using standard deltopectoral approach starting at the coracoid process and extending down to the anterior humerus.  Dissection was down through the subcutaneous tissues using Bovie.  Identified the cephalic vein and took it laterally of the deltoid, pectoralis was  taken medially.  Conjoint tendon identified and retracted medially.  Upper centimeter of pectoralis was released, identified the bicipital groove and then released the subscapularis, subperiosteally off the lesser tuberosity.  We tagged it for repair at the end of surgery.  We released the inferior capsule off the humerus progressively and externally rotating.  We then placed a T-handled Crego elevator over the top of the humeral head, which had significant degenerative changes and collapse, consistent with AVN, placed a large Crego on the medial side of the humerus.  We then externally rotated about 25 degrees and performed our head cut using the head resection guide.  This was flushed with the rotator cuff insertion on the greater tuberosity.  We then removed the excess osteophytes off the inferior and posterior humeral neck.  Next, we reamed first up to a size 14 canal diameter and then broached for the 14 Global Unite stem.  Once we had that broached, we went ahead and subluxed the humerus posteriorly.  We tenodesed the biceps mid tension with the elbow at 90 degrees using a 0 Vicryl suture through the pec tendon.  We then went ahead and released the biceps tendon.  We then did a 360 degrees labral removal and capsular removal, protected the axillary nerve.  We freed it up the subscapularis off the underside of the coracoid process, that had nice balance to it.  We then went ahead and found our center  point of our glenoid face.  We removed the cartilage using a Cobb elevator.  Then, we placed our central guidepin and then reamed for a 44 APG glenoid.  Once we did our reaming, we drilled our central peg hole.  We then drilled our three peripheral holes with care toward proper rotation of the implant and we referenced off the inferior scapula.  We irrigated thoroughly and then used Gelfoam soaked with thrombin on the three peripheral holes to dry the bone.  We then used DePuy  high-viscosity cement with Accu mixing and placed that in the three peripheral holes and then impacted the APG and anchor plate glenoid into place, this was a 44.  We held that until the cement hardened, it was quite stable.  Afterwards, we then drilled holes in the lesser tuberosity and placed #2 FiberWire suture for anatomic repair of the subscap.  We irrigated again the canal.  We then used impaction grafting bone grafting technique and impacted our 14 stem, 14 body Global Unite stem into position.  This was a press-fit stem.  With that in appropriate position, we trialed with a 4418 eccentric humeral head. This gave excellent coverage.  We reduced the shoulder, had nice stability.  We were able to translate inferiorly one-half of the head diameter and one-half posteriorly, so we had nice tensioning on the shoulder.  We then removed the trial 4418 humeral head component and selected the real implant and then impacted that onto the stem, that gave [place] excellent bony coverage, again great stability.  We reduced the shoulder, irrigated thoroughly and then repaired the subscapularis anatomically back to bone including rotator interval repair.  We had excellent range of motion, freedom of motion and stability.  We checked the axillary nerve, it was free and clear and in good condition.  Once we had everything checked, we irrigated and had that subscap repaired. We then went ahead and repaired the deltopectoral interval with 0 Vicryl suture followed by 2-0 Vicryl subcutaneous closure, and 4-0 Monocryl for skin.  Steri-Strips and sterile compressive bandage were applied.  The patient tolerated the surgery well.     Almedia Balls. Ranell Patrick, M.D.     SRN/MEDQ  D:  11/24/2015  T:  11/24/2015  Job:  [id number]

## 2015-11-24 NOTE — Anesthesia Postprocedure Evaluation (Signed)
Anesthesia Post Note  Patient: John [name]  Procedure(s) Performed: Procedure(s) (LRB): RIGHT TOTAL SHOULDER ARTHROPLASTY (Right)  Patient location during evaluation: PACU Anesthesia Type: General Level of consciousness: awake and alert Pain management: pain level controlled Vital Signs Assessment: post-procedure vital signs reviewed and stable Respiratory status: spontaneous breathing, nonlabored ventilation, respiratory function stable and patient connected to nasal cannula oxygen Cardiovascular status: blood pressure returned to baseline and stable Postop Assessment: no signs of nausea or vomiting Anesthetic complications: no    Last Vitals:  Filed Vitals:   11/24/15 1035 11/24/15 1050  BP: 133/92 130/92  Pulse: 82 84  Temp:    Resp: 22 20    Last Pain:  Filed Vitals:   11/24/15 1102  PainSc: 0-No pain                 Foster Frericks,Keymon L

## 2015-11-24 NOTE — Anesthesia Procedure Notes (Addendum)
Procedure Name: Intubation Date/Time: 11/24/2015 7:38 AM Performed by: Izora Gala Pre-anesthesia Checklist: Patient identified, Emergency Drugs available, Suction available and Patient being monitored Patient Re-evaluated:Patient Re-evaluated prior to inductionOxygen Delivery Method: Circle system utilized Preoxygenation: Pre-oxygenation with 100% oxygen Intubation Type: IV induction Laryngoscope Size: Miller and 3 Grade View: Grade III Tube type: Oral Tube size: 7.5 mm Number of attempts: 1 Airway Equipment and Method: Stylet and LTA kit utilized Placement Confirmation: ETT inserted through vocal cords under direct vision,  positive ETCO2 and breath sounds checked- equal and bilateral Secured at: 22 cm Tube secured with: Tape Dental Injury: Teeth and Oropharynx as per pre-operative assessment    Anesthesia Regional Block:  Interscalene brachial plexus block  Pre-Anesthetic Checklist: ,, timeout performed, Correct Patient, Correct Site, Correct Laterality, Correct Procedure, Correct Position, site marked, Risks and benefits discussed,  Surgical consent,  Pre-op evaluation,  At surgeon's request and post-op pain management  Laterality: Right  Prep: chloraprep       Needles:  Injection technique: Single-shot  Needle Type: Echogenic Needle     Needle Length: 13cm 13 cm Needle Gauge: 21 and 21 G    Additional Needles:  Procedures: ultrasound guided (picture in chart) Interscalene brachial plexus block Narrative:  Start time: 11/24/2015 7:05 AM End time: 11/24/2015 7:15 AM Injection made incrementally with aspirations every 5 mL.  Performed by: Personally  Anesthesiologist: Rod Mae  Additional Notes: Patient tolerated the procedure well without complications

## 2015-11-24 NOTE — Transfer of Care (Signed)
Immediate Anesthesia Transfer of Care Note  Patient: John [name]  Procedure(s) Performed: Procedure(s): RIGHT TOTAL SHOULDER ARTHROPLASTY (Right)  Patient Location: PACU  Anesthesia Type:General  Level of Consciousness: awake, alert , oriented and patient cooperative  Airway & Oxygen Therapy: Patient Spontanous Breathing and Patient connected to face mask oxygen  Post-op Assessment: Report given to RN, Post -op Vital signs reviewed and stable, Patient moving all extremities and Patient moving all extremities X 4  Post vital signs: Reviewed and stable  Last Vitals:  Filed Vitals:   11/24/15 0547  BP: 136/88  Pulse: 86  Temp: 36.8 C  Resp: 20    Complications: No apparent anesthesia complications

## 2015-11-25 LAB — BASIC METABOLIC PANEL
ANION GAP: 9 (ref 5–15)
BUN: 6 mg/dL (ref 6–20)
CHLORIDE: 101 mmol/L (ref 101–111)
CO2: 28 mmol/L (ref 22–32)
Calcium: 9.1 mg/dL (ref 8.9–10.3)
Creatinine, Ser: 1 mg/dL (ref 0.61–1.24)
GFR calc non Af Amer: >60 mL/min (ref >60)
GLUCOSE: 124 mg/dL — AB (ref 65–99)
POTASSIUM: 3.9 mmol/L (ref 3.5–5.1)
Sodium: 138 mmol/L (ref 135–145)

## 2015-11-25 LAB — HEMOGLOBIN AND HEMATOCRIT, BLOOD
HCT: 42.6 % (ref 39.0–52.0)
Hemoglobin: 13.3 g/dL (ref 13.0–17.0)

## 2015-11-25 NOTE — Progress Notes (Signed)
Occupational Therapy Treatment Patient Details Name: John [name] MRN: [id number] DOB: [birth year] Today's Date: 11/25/2015    History of present illness Pt is a 55 y.o. male s/p RIGHT TOTAL SHOULDER ARTHROPLASTY on 11/24/2015. PMHx: Anxiety, Depression, Hx of HTN, L THA.     OT comments  Pt making good progress toward OT goals. Pt tolerating AAROM/self ROM to RUE. Reviewed all education and safety; pt able to teach back. D/c plan remains appropriate. Pt ready to d/c from OT standpoint but will continue to follow acutely.    Follow Up Recommendations  Home health OT;Supervision - Intermittent    Equipment Recommendations  None recommended by OT    Recommendations for Other Services      Precautions / Restrictions Precautions Precautions: Shoulder Type of Shoulder Precautions: Active protocol: AROM elbow, wrist, hand OK. AROM/PROM FF 90, ABD 60, ER 30.  Shoulder Interventions: Shoulder sling/immobilizer;For comfort (and sleep) Precaution Booklet Issued: Yes (comment) Precaution Comments: Reviewed precautions with pt. Required Braces or Orthoses: Sling Restrictions Weight Bearing Restrictions: Yes RUE Weight Bearing: Non weight bearing       Mobility Bed Mobility Overal bed mobility: Needs Assistance Bed Mobility: Supine to Sit     Supine to sit: Supervision;HOB elevated     General bed mobility comments: Pt OOB in chair upon arrival.  Transfers Overall transfer level: Needs assistance Equipment used: None Transfers: Sit to/from Stand Sit to Stand: Supervision         General transfer comment: Supervision for safety; no physical assist    Balance Overall balance assessment: No apparent balance deficits (not formally assessed)                                 ADL Overall ADL's : Needs assistance/impaired Eating/Feeding: Set up;Sitting   Grooming: Supervision/safety;Standing   Upper Body Bathing: Minimal assitance;Sitting Upper Body Bathing  Details (indicate cue type and reason): Pt able to return demo technique. Lower Body Bathing: Supervison/ safety;Sit to/from stand   Upper Body Dressing : Minimal assistance;Sitting Upper Body Dressing Details (indicate cue type and reason): Pt able to don shirt with set up and using proper technique. Min assist to don sling. Lower Body Dressing: Supervision/safety;Sit to/from stand   Toilet Transfer: Supervision/safety;Ambulation;Regular Toilet   Toileting- Architect and Hygiene: Supervision/safety;Sit to/from stand       Functional mobility during ADLs: Supervision/safety General ADL Comments: Reviewed NWB status, sling management and wear schedule, ice for edema and pain, RUE exercises.      Vision                     Perception     Praxis      Cognition   Behavior During Therapy: WFL for tasks assessed/performed Overall Cognitive Status: Within Functional Limits for tasks assessed                       Extremity/Trunk Assessment  Upper Extremity Assessment Upper Extremity Assessment: RUE deficits/detail RUE Deficits / Details: Elbow, wrist, hand AROM WFL.  RUE: Unable to fully assess due to pain;Unable to fully assess due to immobilization   Lower Extremity Assessment Lower Extremity Assessment: Overall WFL for tasks assessed   Cervical / Trunk Assessment Cervical / Trunk Assessment: Normal    Exercises Shoulder Exercises Shoulder Flexion: AAROM;Self ROM;Right;10 reps;Seated (0-30 degrees) Shoulder ABduction: AAROM;Self ROM;Right;10 reps;Seated (0-45 degrees) Shoulder External Rotation: AAROM;Self ROM;Right;10 reps;Seated (not able to  acheive neutral) Elbow Flexion: AROM;Right;10 reps;Seated (self assist for full extension, otherwise full ROM) Wrist Flexion: AROM;Right;10 reps;Seated (full ROM) Digit Composite Flexion: AROM;Right;10 reps;Seated (full ROM) Donning/doffing shirt without moving shoulder: Set-up Method for sponge  bathing under operated UE: Supervision/safety Donning/doffing sling/immobilizer: Minimal assistance Correct positioning of sling/immobilizer: Supervision/safety ROM for elbow, wrist and digits of operated UE: Supervision/safety Sling wearing schedule (on at all times/off for ADL's): Supervision/safety Proper positioning of operated UE when showering: Supervision/safety Positioning of UE while sleeping: Supervision/safety   Shoulder Instructions Shoulder Instructions Donning/doffing shirt without moving shoulder: Set-up Method for sponge bathing under operated UE: Supervision/safety Donning/doffing sling/immobilizer: Minimal assistance Correct positioning of sling/immobilizer: Supervision/safety ROM for elbow, wrist and digits of operated UE: Supervision/safety Sling wearing schedule (on at all times/off for ADL's): Supervision/safety Proper positioning of operated UE when showering: Supervision/safety Positioning of UE while sleeping: Supervision/safety     General Comments      Pertinent Vitals/ Pain       Pain Assessment: Faces Pain Score: 10-Worst pain ever Faces Pain Scale: Hurts even more Pain Location: R shoulder Pain Descriptors / Indicators: Aching;Grimacing Pain Intervention(s): Limited activity within patient's tolerance;Monitored during session;Repositioned;Ice applied  Home Living Family/patient expects to be discharged to:: Private residence Living Arrangements: Children Available Help at Discharge: Family;Available PRN/intermittently Type of Home: Other(Comment) (townhome) Home Access: Stairs to enter [employer] of Steps: 3 Entrance Stairs-Rails: Can reach both Home Layout: Two level;Bed/bath upstairs     Bathroom Shower/Tub: Tub/shower unit;Walk-in shower   Bathroom Toilet: Standard     Home Equipment: Cane - single point;Walker - 2 wheels          Prior Functioning/Environment Level of Independence: Independent            Frequency  Min 2X/week     Progress Toward Goals  OT Goals(current goals can now be found in the care plan section)  Progress towards OT goals: Progressing toward goals  Acute Rehab OT Goals Patient Stated Goal: decrease pain then go home OT Goal Formulation: With patient Time For Goal Achievement: 12/09/15 Potential to Achieve Goals: Good ADL Goals Pt Will Perform Upper Body Bathing: with modified independence;standing Pt Will Perform Upper Body Dressing: with modified independence;sitting Pt Will Transfer to Toilet: with modified independence;ambulating;regular height toilet Pt Will Perform Toileting - Clothing Manipulation and hygiene: with modified independence;sit to/from stand Pt/caregiver will Perform Home Exercise Program: Increased ROM;Right Upper extremity;Independently;With written HEP provided Additional ADL Goal #1: Pt will independently don/doff sling for increased safety/independence with ADLs and functional mobility.  Plan Discharge plan remains appropriate    Co-evaluation                 End of Session Equipment Utilized During Treatment: Other (comment) (sling)   Activity Tolerance Patient tolerated treatment well   Patient Left in chair;with call bell/phone within reach   Nurse Communication Other (comment) (pt ready to d/c from OT standpoint)        Time: 1610-9604 OT Time Calculation (min): 30 min  Charges: OT General Charges $OT Visit: 1 Procedure OT Evaluation $OT Eval Moderate Complexity: 1 Procedure OT Treatments $Self Care/Home Management : 8-22 mins $Therapeutic Exercise: 8-22 mins  Gaye Alken M.S., OTR/L Pager: [phone]  11/25/2015, 11:17 AM

## 2015-11-25 NOTE — Evaluation (Signed)
Occupational Therapy Evaluation Patient Details Name: John [name] MRN: [id number] DOB: Aug 06, 1961 Today's Date: 11/25/2015    History of Present Illness Pt is a 55 y.o. male s/p RIGHT TOTAL SHOULDER ARTHROPLASTY on 11/24/2015. PMHx: Anxiety, Depression, Hx of HTN, L THA.     Clinical Impression   Pt reports he was independent with ADLs and mobility PTA. Currently pt is overall supervision for safety with ADLs and functional mobility with the exception of min assist for UB ADLs. Began safety, ADL, and shoulder education with pt; he verbalized understanding. Pt planning to d/c home with intermittent supervision from family. Recommending HHOT for follow up in order to maximize independence and safety with ADLs and functional mobility upon return home. Pt would benefit from continued skilled OT to address established goals.    Follow Up Recommendations  Home health OT;Supervision - Intermittent    Equipment Recommendations  None recommended by OT    Recommendations for Other Services       Precautions / Restrictions Precautions Precautions: Shoulder Type of Shoulder Precautions: Active protocol:  Shoulder Interventions: Shoulder sling/immobilizer;For comfort (and sleep) Precaution Booklet Issued: Yes (comment) Precaution Comments: Educated pt on precautions. Required Braces or Orthoses: Sling Restrictions Weight Bearing Restrictions: Yes RUE Weight Bearing: Non weight bearing      Mobility Bed Mobility Overal bed mobility: Needs Assistance Bed Mobility: Supine to Sit     Supine to sit: Supervision;HOB elevated     General bed mobility comments: HOB elevated with use of rail. Pt plans to sleep in recliner upon d/c home  Transfers Overall transfer level: Needs assistance Equipment used: None Transfers: Sit to/from Stand Sit to Stand: Supervision         General transfer comment: Supervision for safety, no physical assist required.    Balance Overall balance  assessment: No apparent balance deficits (not formally assessed)                                          ADL Overall ADL's : Needs assistance/impaired Eating/Feeding: Set up;Sitting   Grooming: Supervision/safety;Standing   Upper Body Bathing: Minimal assitance;Sitting   Lower Body Bathing: Supervison/ safety;Sit to/from stand   Upper Body Dressing : Minimal assistance;Sitting   Lower Body Dressing: Supervision/safety;Sit to/from stand   Toilet Transfer: Supervision/safety;Ambulation;Regular Toilet   Toileting- Architect and Hygiene: Supervision/safety;Sit to/from stand       Functional mobility during ADLs: Supervision/safety General ADL Comments: No family present for OT eval. Educated pt on RUE positioning in bed/chair, sling management and wear schedule, ice for edema and pain, NWB on RUE, UB bathing/dressing technique.     Vision     Perception     Praxis      Pertinent Vitals/Pain Pain Assessment: 0-10 Pain Score: 10-Worst pain ever Pain Location: R shoulder Pain Descriptors / Indicators: Aching;Grimacing;Operative site guarding Pain Intervention(s): Limited activity within patient's tolerance;Monitored during session;Repositioned;Premedicated before session;Ice applied     Hand Dominance Right   Extremity/Trunk Assessment Upper Extremity Assessment Upper Extremity Assessment: RUE deficits/detail RUE Deficits / Details: Elbow, wrist, hand AROM WFL.  RUE: Unable to fully assess due to pain;Unable to fully assess due to immobilization   Lower Extremity Assessment Lower Extremity Assessment: Overall WFL for tasks assessed   Cervical / Trunk Assessment Cervical / Trunk Assessment: Normal   Communication Communication Communication: No difficulties   Cognition Arousal/Alertness: Awake/alert Behavior During Therapy: WFL for tasks  assessed/performed Overall Cognitive Status: Within Functional Limits for tasks assessed                      General Comments       Exercises Exercises: Shoulder     Shoulder Instructions Shoulder Instructions Donning/doffing shirt without moving shoulder:  (educated) Method for sponge bathing under operated UE:  (educated) Donning/doffing sling/immobilizer: Moderate assistance (educated) Correct positioning of sling/immobilizer: Supervision/safety (educated) Sling wearing schedule (on at all times/off for ADL's): Supervision/safety (educated) Proper positioning of operated UE when showering: Supervision/safety (educated) Positioning of UE while sleeping: Minimal assistance (educated)    Home Living Family/patient expects to be discharged to:: Private residence Living Arrangements: Children Available Help at Discharge: Family;Available PRN/intermittently Type of Home: Other(Comment) (townhome) Home Access: Stairs to enter [employer] of Steps: 3 Entrance Stairs-Rails: Can reach both Home Layout: Two level;Bed/bath upstairs     Bathroom Shower/Tub: Tub/shower unit;Walk-in shower   Bathroom Toilet: Standard     Home Equipment: Cane - single point;Walker - 2 wheels          Prior Functioning/Environment Level of Independence: Independent             OT Diagnosis: Acute pain   OT Problem List: Decreased strength;Decreased range of motion;Decreased safety awareness;Decreased knowledge of use of DME or AE;Decreased knowledge of precautions;Impaired UE functional use;Pain   OT Treatment/Interventions: Self-care/ADL training;Therapeutic exercise;Energy conservation;DME and/or AE instruction;Therapeutic activities;Patient/family education    OT Goals(Current goals can be found in the care plan section) Acute Rehab OT Goals Patient Stated Goal: decrease pain then go home OT Goal Formulation: With patient Time For Goal Achievement: 12/09/15 Potential to Achieve Goals: Good ADL Goals Pt Will Perform Upper Body Bathing: with modified  independence;standing Pt Will Perform Upper Body Dressing: with modified independence;sitting Pt Will Transfer to Toilet: with modified independence;ambulating;regular height toilet Pt Will Perform Toileting - Clothing Manipulation and hygiene: with modified independence;sit to/from stand Pt/caregiver will Perform Home Exercise Program: Increased ROM;Right Upper extremity;Independently;With written HEP provided Additional ADL Goal #1: Pt will independently don/doff sling for increased safety/independence with ADLs and functional mobility.  OT Frequency: Min 2X/week   Barriers to D/C: Decreased caregiver support;Inaccessible home environment  bed/bath on second floor, family available intermittently       Co-evaluation              End of Session Equipment Utilized During Treatment: Other (comment) (sling) Nurse Communication: Other (comment) (OT to see pt one more time prior to d/c)  Activity Tolerance: Patient tolerated treatment well Patient left: in chair;with call bell/phone within reach   Time: 0840-0908 OT Time Calculation (min): 28 min Charges:  OT General Charges $OT Visit: 1 Procedure OT Evaluation $OT Eval Moderate Complexity: 1 Procedure OT Treatments $Self Care/Home Management : 8-22 mins G-Codes:     Gaye Alken M.S., OTR/L Pager: [phone]  11/25/2015, 9:57 AM

## 2015-11-25 NOTE — Progress Notes (Signed)
Orthopedics Progress Note  Subjective: Patient reported increased pain last night when the block wore off.  Doing better this morning  Objective:  Filed Vitals:   11/25/15 0033 11/25/15 0608  BP: 141/77 139/82  Pulse: 100 88  Temp: 99.2 F (37.3 C) 99.1 F (37.3 C)  Resp: 18 18    General: Awake and alert  Musculoskeletal: right shoulder dressing CDI, NVI Neurovascularly intact  Lab Results  Component Value Date   WBC 8.2 11/14/2015   HGB 15.5 11/14/2015   HCT 45.8 11/14/2015   MCV 97.9 11/14/2015   PLT 256 11/14/2015       Component Value Date/Time   NA 141 11/14/2015 1534   K 4.3 11/14/2015 1534   CL 106 11/14/2015 1534   CO2 23 11/14/2015 1534   GLUCOSE 95 11/14/2015 1534   BUN 8 11/14/2015 1534   CREATININE 0.98 11/14/2015 1534   CALCIUM 9.3 11/14/2015 1534   GFRNONAA >60 11/14/2015 1534   GFRAA >60 11/14/2015 1534    Lab Results  Component Value Date   INR 0.98 03/21/2015    Assessment/Plan: POD #1 s/p Procedure(s): RIGHT TOTAL SHOULDER ARTHROPLASTY Stable overnight.  D/C to home after therapy. Will need home health therapy  Almedia Balls. Ranell Patrick, MD 11/25/2015 7:04 AM

## 2015-11-25 NOTE — Discharge Summary (Signed)
Physician Discharge Summary   Patient ID: John [name] MRN: [id number] DOB/AGE: October 30, 1960 55 y.o.  Admit date: Dec 19, 2015 Discharge date: 11/25/2015  Admission Diagnoses:  Active Problems:   S/P shoulder replacement   Discharge Diagnoses:  Same   Surgeries: Procedure(s): RIGHT TOTAL SHOULDER ARTHROPLASTY on 2015/12/19   Consultants: OT  Discharged Condition: Stable  Hospital Course: John [name] is an 55 y.o. male who was admitted December 19, 2015 with a chief complaint of right shoulder pain, and found to have a diagnosis of right shoulder AVN/ OA.  They were brought to the operating room on Dec 19, 2015 and underwent the above named procedures.    The patient had an uncomplicated hospital course and was stable for discharge.  Recent vital signs:  Filed Vitals:   11/25/15 0033 11/25/15 0608  BP: 141/77 139/82  Pulse: 100 88  Temp: 99.2 F (37.3 C) 99.1 F (37.3 C)  Resp: 18 18    Recent laboratory studies:  Results for orders placed or performed during the hospital encounter of 11/14/15  Surgical pcr screen  Result Value Ref Range   MRSA, PCR NEGATIVE NEGATIVE   Staphylococcus aureus NEGATIVE NEGATIVE  CBC  Result Value Ref Range   WBC 8.2 4.0 - 10.5 K/uL   RBC 4.68 4.22 - 5.81 MIL/uL   Hemoglobin 15.5 13.0 - 17.0 g/dL   HCT 40.9 81.1 - 91.4 %   MCV 97.9 78.0 - 100.0 fL   MCH 33.1 26.0 - 34.0 pg   MCHC 33.8 30.0 - 36.0 g/dL   RDW 78.2 95.6 - 21.3 %   Platelets 256 150 - 400 K/uL  Basic metabolic panel  Result Value Ref Range   Sodium 141 135 - 145 mmol/L   Potassium 4.3 3.5 - 5.1 mmol/L   Chloride 106 101 - 111 mmol/L   CO2 23 22 - 32 mmol/L   Glucose, Bld 95 65 - 99 mg/dL   BUN 8 6 - 20 mg/dL   Creatinine, Ser 0.86 0.61 - 1.24 mg/dL   Calcium 9.3 8.9 - 57.8 mg/dL   GFR calc non Af Amer >60 >60 mL/min   GFR calc Af Amer >60 >60 mL/min   Anion gap 12 5 - 15    Discharge Medications:     Medication List    TAKE these medications        escitalopram 10 MG tablet  Commonly known as:  LEXAPRO  TAKE 1 TABLET (10 MG TOTAL) BY MOUTH DAILY.     HYDROcodone-acetaminophen 5-325 MG tablet  Commonly known as:  NORCO/VICODIN  Take 1 tablet by mouth every 6 (six) hours as needed.     LORazepam 1 MG tablet  Commonly known as:  ATIVAN  Take 0.5-1 mg by mouth 3 (three) times daily as needed.     methocarbamol 500 MG tablet  Commonly known as:  ROBAXIN  Take 1 tablet (500 mg total) by mouth 3 (three) times daily as needed.     montelukast 10 MG tablet  Commonly known as:  SINGULAIR  Take 1 tablet by mouth daily.     multivitamin with minerals tablet  Take 1 tablet by mouth daily.     oxyCODONE-acetaminophen 5-325 MG tablet  Commonly known as:  ROXICET  Take 1-2 tablets by mouth every 4 (four) hours as needed for severe pain.        Diagnostic Studies: Dg Shoulder Right Port  Dec 19, 2015  CLINICAL DATA:  Right shoulder arthroplasty for avascular necrosis. EXAM: PORTABLE RIGHT SHOULDER - 2+ VIEW COMPARISON:  None. FINDINGS: Right shoulder arthroplasty identified without complicating features. There is no evidence of fracture or dislocation. IMPRESSION: Right shoulder arthroplasty without complicating features. Electronically Signed   By: Harmon Pier M.D.   On: 11/24/2015 16:22    Disposition: 06-Home-Health Care Svc        Follow-up Information    Follow up with Annalycia Done,STEVEN R, MD. Call in 2 weeks.   Specialty:  Orthopedic Surgery   Why:  [phone]   Contact information:   [address] [place] [address] [phone]        Signed: Verlee Rossetti 11/25/2015, 7:06 AM

## 2015-11-27 ENCOUNTER — Encounter (HOSPITAL_COMMUNITY): Payer: Self-pay | Admitting: Orthopedic Surgery

## 2016-02-22 ENCOUNTER — Emergency Department (HOSPITAL_BASED_OUTPATIENT_CLINIC_OR_DEPARTMENT_OTHER)
Admission: EM | Admit: 2016-02-22 | Discharge: 2016-02-22 | Disposition: A | Discharge status: Home / Self Care | Payer: Self-pay | Attending: Emergency Medicine | Admitting: Emergency Medicine

## 2016-02-22 ENCOUNTER — Emergency Department (HOSPITAL_BASED_OUTPATIENT_CLINIC_OR_DEPARTMENT_OTHER): Payer: Self-pay

## 2016-02-22 ENCOUNTER — Encounter (HOSPITAL_BASED_OUTPATIENT_CLINIC_OR_DEPARTMENT_OTHER): Payer: Self-pay

## 2016-02-22 DIAGNOSIS — F329 Major depressive disorder, single episode, unspecified: Secondary | ICD-10-CM | POA: Insufficient documentation

## 2016-02-22 DIAGNOSIS — S60222A Contusion of left hand, initial encounter: Secondary | ICD-10-CM | POA: Insufficient documentation

## 2016-02-22 DIAGNOSIS — R0781 Pleurodynia: Secondary | ICD-10-CM | POA: Insufficient documentation

## 2016-02-22 DIAGNOSIS — W0110XA Fall on same level from slipping, tripping and stumbling with subsequent striking against unspecified object, initial encounter: Secondary | ICD-10-CM | POA: Insufficient documentation

## 2016-02-22 DIAGNOSIS — Y92828 Other wilderness area as the place of occurrence of the external cause: Secondary | ICD-10-CM | POA: Insufficient documentation

## 2016-02-22 DIAGNOSIS — Y939 Activity, unspecified: Secondary | ICD-10-CM | POA: Insufficient documentation

## 2016-02-22 DIAGNOSIS — R0789 Other chest pain: Secondary | ICD-10-CM

## 2016-02-22 DIAGNOSIS — Y999 Unspecified external cause status: Secondary | ICD-10-CM | POA: Insufficient documentation

## 2016-02-22 DIAGNOSIS — I10 Essential (primary) hypertension: Secondary | ICD-10-CM | POA: Insufficient documentation

## 2016-02-22 DIAGNOSIS — M19011 Primary osteoarthritis, right shoulder: Secondary | ICD-10-CM | POA: Insufficient documentation

## 2016-02-22 DIAGNOSIS — M546 Pain in thoracic spine: Secondary | ICD-10-CM | POA: Insufficient documentation

## 2016-02-22 DIAGNOSIS — M25532 Pain in left wrist: Secondary | ICD-10-CM

## 2016-02-22 MED ORDER — OXYCODONE-ACETAMINOPHEN 5-325 MG PO TABS: 1.0000 | ORAL_TABLET | ORAL | Status: DC | PRN

## 2016-02-22 MED FILL — OXYCODONE/APAP 5-325: 5-325 | 2 days supply | Qty: 10 | Fill #0

## 2016-02-22 NOTE — ED Notes (Signed)
Patient transported to X-ray 

## 2016-02-22 NOTE — Discharge Instructions (Signed)
Medications: Percocet  Treatment: Take 1-2 Percocet as needed for severe pain. Do not drive or operate machinery when taking this medication. Take Tylenol or ibuprofen for more mild pain throughout the day. Do not take Tylenol and Percocet together within 4 hours of each other, as Percocet has Tylenol in it. Continue ice and heat 20 minutes on, 20 minutes off. Wear your wrist splint at all times, except when bathing.  Follow-up: Please follow-up with Dr. Pearletha Forge for further evaluation of your wrist fracture. Please follow-up with your primary care provider if your symptoms are persisting. Please return to emergency department if you develop any new or worsening symptoms.

## 2016-02-22 NOTE — ED Notes (Addendum)
Pt tripped/fel approx 1week ago-pain to right and left rib and mid back-NAD-steady gait

## 2016-02-22 NOTE — ED Provider Notes (Signed)
CSN: [id number]     Arrival date & time 02/22/16  1100 History   First MD Initiated Contact with Patient 02/22/16 1121     Chief Complaint  Patient presents with  . Fall     (Consider location/radiation/quality/duration/timing/severity/associated sxs/prior Treatment) HPI Comments: Patient is a 55 year old male who presents following fall one week ago with right and left rib pain and right sided back pain. Patient was helping a neighbor when he tripped over a dog and rolled down a grassy hill. Patient fell on his left side and rolled. Patient reported he did hit his head, but denies losing consciousness. Patient reports he had a scrape on his left forehead, which is now healed. Patient denies any headaches. Patient also reports left wrist pain that is improving, but he still has mild tenderness and soreness. Patient denies any shortness of breath. Patient's pain is worse in his right chest and right thoracic back. Patient has had trouble sleeping because of the pain. Patient denies any abdominal pain, nausea, vomiting, dysuria, hematuria. Patient has been taking Aleve and Advil and using a heating pad at home. Patient does feel like he has been improving, but he is still in a significant amount of pain in his ribs. Patient also reports some decreased appetite since the incident.  Patient is a 55 y.o. male presenting with fall. The history is provided by the patient.  Fall Associated symptoms include arthralgias. Pertinent negatives include no abdominal pain, chest pain, chills, fever, headaches, nausea, rash, sore throat or vomiting.    Past Medical History  Diagnosis Date  . Anxiety     Ativan  . Depression     history of  . Right hand fracture   . History of hypertension   . Sleep apnea     wears a CPAP  . Headache   . Chronic left hip pain   . Osteoarthritis     right shoulder  . Wears glasses   . Seasonal allergies    Past Surgical History  Procedure Laterality Date  . Wisdom  tooth extraction    . Total hip arthroplasty Left 03/27/2015    Procedure: LEFT TOTAL HIP ARTHROPLASTY ANTERIOR APPROACH;  Surgeon: Samson Frederic, MD;  Location: [facility] OR;  Service: Orthopedics;  Laterality: Left;  . Total shoulder arthroplasty Right 11/24/2015    Procedure: RIGHT TOTAL SHOULDER ARTHROPLASTY;  Surgeon: Beverely Low, MD;  Location: [facility] OR;  Service: Orthopedics;  Laterality: Right;   No family history on file. Social History  Substance Use Topics  . Smoking status: Never Smoker   . Smokeless tobacco: Never Used  . Alcohol Use: 1.8 oz/week    3 Cans of beer per week     Comment: occasional    Review of Systems  Constitutional: Positive for appetite change. Negative for fever and chills.  HENT: Negative for facial swelling and sore throat.   Respiratory: Negative for shortness of breath.   Cardiovascular: Negative for chest pain.  Gastrointestinal: Negative for nausea, vomiting and abdominal pain.  Genitourinary: Negative for dysuria.  Musculoskeletal: Positive for back pain and arthralgias.  Skin: Negative for rash and wound.  Neurological: Negative for headaches.  Psychiatric/Behavioral: The patient is not nervous/anxious.       Allergies  Review of patient's allergies indicates no known allergies.  Home Medications   Prior to Admission medications   Medication Sig Start Date End Date Taking? Authorizing Provider  escitalopram (LEXAPRO) 10 MG tablet TAKE 1 TABLET (10 MG TOTAL) BY MOUTH DAILY. 10/31/15  Historical Provider, MD  LORazepam (ATIVAN) 1 MG tablet Take 0.5-1 mg by mouth 3 (three) times daily as needed. 03/10/15   Historical Provider, MD  Multiple Vitamins-Minerals (MULTIVITAMIN WITH MINERALS) tablet Take 1 tablet by mouth daily.    Historical Provider, MD  oxyCODONE-acetaminophen (PERCOCET/ROXICET) 5-325 MG tablet Take 1-2 tablets by mouth every 4 (four) hours as needed for severe pain. 02/22/16   Anberlin Diez M Rhodes Calvert, PA-C   BP 141/101 mmHg  Pulse 70   Temp(Src) 98.2 F (36.8 C) (Oral)  Resp 18  Ht 5\' 11"  (1.803 m)  Wt 97.523 kg  BMI 30.00 kg/m2  SpO2 100% Physical Exam  Constitutional: He appears well-developed and well-nourished. No distress.  HENT:  Head: Normocephalic and atraumatic.  Mouth/Throat: Oropharynx is clear and moist. No oropharyngeal exudate.  Eyes: Conjunctivae and EOM are normal. Pupils are equal, round, and reactive to light. Right eye exhibits no discharge. Left eye exhibits no discharge. No scleral icterus.  Neck: Normal range of motion. Neck supple. No thyromegaly present.  Cardiovascular: Normal rate, regular rhythm, normal heart sounds and intact distal pulses.  Exam reveals no gallop and no friction rub.   No murmur heard. Pulmonary/Chest: Effort normal and breath sounds normal. No stridor. No respiratory distress. He has no wheezes. He has no rales.   He exhibits tenderness.    Abdominal: Soft. Bowel sounds are normal. He exhibits no distension. There is no tenderness. There is no rebound and no guarding.  Musculoskeletal: He exhibits no edema.       Left wrist: He exhibits tenderness and bony tenderness. He exhibits normal range of motion.  Right thoracic tenderness as indicated above, no bony tenderness; ecchymosis to the dorsum of left hand, bony tenderness to the ulnar side of left wrist  Lymphadenopathy:    He has no cervical adenopathy.  Neurological: He is alert. Coordination normal.  CN 3-12 intact; normal sensation throughout; 5/5 strength in all 4 extremities; equal bilateral grip strength   Skin: Skin is warm and dry. No rash noted. He is not diaphoretic. No pallor.  Psychiatric: He has a normal mood and affect.  Nursing note and vitals reviewed.   ED Course  Procedures (including critical care time) Labs Review Labs Reviewed - No data to display  Imaging Review Dg Chest 2 View  02/22/2016  CLINICAL DATA:  Bilateral rib pain after fall 1 week ago. EXAM: CHEST  2 VIEW COMPARISON:  None.  FINDINGS: The patient is status post right shoulder replacement. There is pleural thickening on the right. There is a subtle contour abnormality of the fourth lateral right rib which could represent sequela of a fracture, especially given the pleural thickening in this region. Dedicated rib films may better evaluate if clinically warranted. No pneumothorax. No pulmonary nodules or masses. No focal infiltrates. No other acute abnormalities identified. IMPRESSION: Pleural thickening on the right may be due to a subtle underlying right lateral rib fracture. Dedicated rib films could further evaluate. No other acute abnormalities. Electronically Signed   By: Gerome Sam III M.D   On: 02/22/2016 11:45   Dg Wrist Complete Left  02/22/2016  CLINICAL DATA:  Fall with left wrist pain and swelling. EXAM: LEFT WRIST - COMPLETE 3+ VIEW COMPARISON:  None. FINDINGS: There is a slightly displaced fracture of the triquetrum, with associated small avulsion fracture fragment seen posterior to the carpal bones on the lateral projection. Remainder the carpal bones appear intact and normally aligned. Distal radius and ulna appear intact and normally aligned.  Visualized portions of the metacarpal bones appear intact and normally aligned. IMPRESSION: Slightly displaced fracture of the triquetrum, with associated small avulsion fracture fragment posteriorly. Electronically Signed   By: Bary Richard M.D.   On: 02/22/2016 12:19   I have personally reviewed and evaluated these images and lab results as part of my medical decision-making.   EKG Interpretation None      12:45pm Patient given crackers and soda. Awaiting respirtory therapy for spirometer and orthopedic tech for wrist splint.  MDM   CXR shows pleural thickening on the right may be due to a subtle underlying right lateral rib fracture, but no other acute abnormalities. Left wrist xray shows slightly displaced fracture of the triquetrum, with associated small  avulsion fracture fragment posteriorly. Patient fitted with wrist splint in ED with follow-up to orthopedics. Patient given small amount of Percocet for analgesia at night. Patient encouraged to continue heating pad and anti-inflammatories or Tylenol throughout the day. Patient also evaluated by Dr. Clydene Pugh who is in agreement with plan. Patient vitals stable throughout ED course and discharged in satisfactory condition. Patient is understanding and in agreement with plan.  Final diagnoses:  Rib pain on right side  Rib pain on left side  Left wrist pain        Emi Holes, PA-C 02/22/16 1611  Lyndal Pulley, MD 02/22/16 [phone]

## 2016-03-16 DIAGNOSIS — K922 Gastrointestinal hemorrhage, unspecified: Secondary | ICD-10-CM

## 2016-03-16 HISTORY — DX: Gastrointestinal hemorrhage, unspecified: K92.2

## 2016-03-19 ENCOUNTER — Inpatient Hospital Stay (HOSPITAL_BASED_OUTPATIENT_CLINIC_OR_DEPARTMENT_OTHER)
Admission: EM | Admit: 2016-03-19 | Discharge: 2016-03-21 | DRG: 378 | Disposition: A | Discharge status: Home / Self Care | Payer: Self-pay | Attending: Internal Medicine | Admitting: Internal Medicine

## 2016-03-19 ENCOUNTER — Encounter (HOSPITAL_BASED_OUTPATIENT_CLINIC_OR_DEPARTMENT_OTHER): Payer: Self-pay | Admitting: Emergency Medicine

## 2016-03-19 ENCOUNTER — Emergency Department (HOSPITAL_BASED_OUTPATIENT_CLINIC_OR_DEPARTMENT_OTHER): Payer: Self-pay

## 2016-03-19 DIAGNOSIS — Z96642 Presence of left artificial hip joint: Secondary | ICD-10-CM | POA: Diagnosis present

## 2016-03-19 DIAGNOSIS — I10 Essential (primary) hypertension: Secondary | ICD-10-CM | POA: Diagnosis present

## 2016-03-19 DIAGNOSIS — K259 Gastric ulcer, unspecified as acute or chronic, without hemorrhage or perforation: Secondary | ICD-10-CM | POA: Diagnosis present

## 2016-03-19 DIAGNOSIS — M1990 Unspecified osteoarthritis, unspecified site: Secondary | ICD-10-CM | POA: Diagnosis present

## 2016-03-19 DIAGNOSIS — K921 Melena: Principal | ICD-10-CM | POA: Diagnosis present

## 2016-03-19 DIAGNOSIS — Z96611 Presence of right artificial shoulder joint: Secondary | ICD-10-CM | POA: Diagnosis present

## 2016-03-19 DIAGNOSIS — D62 Acute posthemorrhagic anemia: Secondary | ICD-10-CM | POA: Diagnosis present

## 2016-03-19 DIAGNOSIS — F1020 Alcohol dependence, uncomplicated: Secondary | ICD-10-CM | POA: Diagnosis present

## 2016-03-19 DIAGNOSIS — F1021 Alcohol dependence, in remission: Secondary | ICD-10-CM

## 2016-03-19 DIAGNOSIS — Z79899 Other long term (current) drug therapy: Secondary | ICD-10-CM

## 2016-03-19 DIAGNOSIS — R778 Other specified abnormalities of plasma proteins: Secondary | ICD-10-CM

## 2016-03-19 DIAGNOSIS — R7989 Other specified abnormal findings of blood chemistry: Secondary | ICD-10-CM

## 2016-03-19 DIAGNOSIS — K922 Gastrointestinal hemorrhage, unspecified: Secondary | ICD-10-CM | POA: Diagnosis present

## 2016-03-19 DIAGNOSIS — R0781 Pleurodynia: Secondary | ICD-10-CM | POA: Diagnosis present

## 2016-03-19 DIAGNOSIS — F329 Major depressive disorder, single episode, unspecified: Secondary | ICD-10-CM | POA: Diagnosis present

## 2016-03-19 DIAGNOSIS — F419 Anxiety disorder, unspecified: Secondary | ICD-10-CM | POA: Diagnosis present

## 2016-03-19 DIAGNOSIS — K920 Hematemesis: Secondary | ICD-10-CM | POA: Diagnosis present

## 2016-03-19 DIAGNOSIS — K210 Gastro-esophageal reflux disease with esophagitis: Secondary | ICD-10-CM | POA: Diagnosis present

## 2016-03-19 DIAGNOSIS — G4733 Obstructive sleep apnea (adult) (pediatric): Secondary | ICD-10-CM | POA: Diagnosis present

## 2016-03-19 DIAGNOSIS — I248 Other forms of acute ischemic heart disease: Secondary | ICD-10-CM | POA: Diagnosis present

## 2016-03-19 HISTORY — DX: Gastrointestinal hemorrhage, unspecified: K92.2

## 2016-03-19 LAB — TROPONIN I
TROPONIN I: 0.04 ng/mL — AB (ref <0.03)
TROPONIN I: 0.05 ng/mL — AB (ref <0.03)
Troponin I: 0.05 ng/mL (ref <0.03)

## 2016-03-19 LAB — COMPREHENSIVE METABOLIC PANEL
ALBUMIN: 3.6 g/dL (ref 3.5–5.0)
ALK PHOS: 91 U/L (ref 38–126)
ALT: 19 U/L (ref 17–63)
AST: 20 U/L (ref 15–41)
Anion gap: 6 (ref 5–15)
BUN: 34 mg/dL — AB (ref 6–20)
CALCIUM: 8.9 mg/dL (ref 8.9–10.3)
CHLORIDE: 107 mmol/L (ref 101–111)
CO2: 22 mmol/L (ref 22–32)
CREATININE: 0.68 mg/dL (ref 0.61–1.24)
GFR calc non Af Amer: >60 mL/min (ref >60)
GLUCOSE: 112 mg/dL — AB (ref 65–99)
Potassium: 3.9 mmol/L (ref 3.5–5.1)
SODIUM: 135 mmol/L (ref 135–145)
Total Bilirubin: 1 mg/dL (ref 0.3–1.2)
Total Protein: 6.3 g/dL — ABNORMAL LOW (ref 6.5–8.1)

## 2016-03-19 LAB — CBC WITH DIFFERENTIAL/PLATELET
BASOS PCT: 0 %
Basophils Absolute: 0 10*3/uL (ref 0.0–0.1)
EOS PCT: 1 %
Eosinophils Absolute: 0.1 10*3/uL (ref 0.0–0.7)
HEMATOCRIT: 36 % — AB (ref 39.0–52.0)
Hemoglobin: 12.2 g/dL — ABNORMAL LOW (ref 13.0–17.0)
LYMPHS ABS: 1.7 10*3/uL (ref 0.7–4.0)
Lymphocytes Relative: 17 %
MCH: 34 pg (ref 26.0–34.0)
MCHC: 33.9 g/dL (ref 30.0–36.0)
MCV: 100.3 fL — AB (ref 78.0–100.0)
MONO ABS: 1 10*3/uL (ref 0.1–1.0)
MONOS PCT: 10 %
Neutro Abs: 7.1 10*3/uL (ref 1.7–7.7)
Neutrophils Relative %: 72 %
Platelets: 210 10*3/uL (ref 150–400)
RBC: 3.59 MIL/uL — AB (ref 4.22–5.81)
RDW: 11.5 % (ref 11.5–15.5)
WBC: 9.9 10*3/uL (ref 4.0–10.5)

## 2016-03-19 LAB — HEMOGLOBIN AND HEMATOCRIT, BLOOD
HCT: 34.6 % — ABNORMAL LOW (ref 39.0–52.0)
HEMATOCRIT: 33.3 % — AB (ref 39.0–52.0)
HEMOGLOBIN: 10.7 g/dL — AB (ref 13.0–17.0)
Hemoglobin: 11.4 g/dL — ABNORMAL LOW (ref 13.0–17.0)

## 2016-03-19 LAB — LIPASE, BLOOD: Lipase: 15 U/L (ref 11–51)

## 2016-03-19 LAB — APTT: aPTT: 25 seconds (ref 24–37)

## 2016-03-19 LAB — PROTIME-INR
INR: 1.08 (ref 0.00–1.49)
PROTHROMBIN TIME: 14.2 s (ref 11.6–15.2)

## 2016-03-19 LAB — TYPE AND SCREEN
ABO/RH(D): O POS
ANTIBODY SCREEN: NEGATIVE

## 2016-03-19 LAB — OCCULT BLOOD X 1 CARD TO LAB, STOOL: FECAL OCCULT BLD: POSITIVE — AB

## 2016-03-19 MED ORDER — ACETAMINOPHEN 650 MG RE SUPP: 650.0000 mg | Freq: Four times a day (QID) | RECTAL | Status: DC | PRN

## 2016-03-19 MED ORDER — THIAMINE HCL 100 MG/ML IJ SOLN: 100.0000 mg | Freq: Every day | INTRAMUSCULAR | Status: DC

## 2016-03-19 MED ORDER — ACETAMINOPHEN 325 MG PO TABS: 650.0000 mg | ORAL_TABLET | Freq: Four times a day (QID) | ORAL | Status: DC | PRN

## 2016-03-19 MED ORDER — SODIUM CHLORIDE 0.9 % IV SOLN
8.0000 mg/h | INTRAVENOUS | Status: DC
  Administered 2016-03-19 – 2016-03-21 (×4): 8 mg/h via INTRAVENOUS
  Filled 2016-03-19 (×10): qty 80

## 2016-03-19 MED ORDER — ONDANSETRON HCL 4 MG/2ML IJ SOLN: 4.0000 mg | Freq: Four times a day (QID) | INTRAMUSCULAR | Status: DC | PRN

## 2016-03-19 MED ORDER — THIAMINE HCL 100 MG/ML IJ SOLN
Freq: Once | INTRAVENOUS | Status: AC
  Administered 2016-03-19: 15:00:00 via INTRAVENOUS
  Filled 2016-03-19: qty 1000

## 2016-03-19 MED ORDER — FAMOTIDINE IN NACL 20-0.9 MG/50ML-% IV SOLN
20.0000 mg | Freq: Two times a day (BID) | INTRAVENOUS | Status: DC
  Administered 2016-03-19: 20 mg via INTRAVENOUS
  Filled 2016-03-19 (×2): qty 50

## 2016-03-19 MED ORDER — LIDOCAINE 5 % EX PTCH
1.0000 | MEDICATED_PATCH | CUTANEOUS | Status: DC
  Administered 2016-03-19 – 2016-03-21 (×3): 1 via TRANSDERMAL
  Filled 2016-03-19 (×3): qty 1

## 2016-03-19 MED ORDER — ADULT MULTIVITAMIN W/MINERALS CH
1.0000 | ORAL_TABLET | Freq: Every day | ORAL | Status: DC
  Administered 2016-03-20 – 2016-03-21 (×2): 1 via ORAL
  Filled 2016-03-19 (×2): qty 1

## 2016-03-19 MED ORDER — SODIUM CHLORIDE 0.9 % IV SOLN
80.0000 mg | Freq: Once | INTRAVENOUS | Status: AC
  Administered 2016-03-19: 80 mg via INTRAVENOUS
  Filled 2016-03-19: qty 80

## 2016-03-19 MED ORDER — MORPHINE SULFATE (PF) 2 MG/ML IV SOLN
2.0000 mg | INTRAVENOUS | Status: DC | PRN
  Administered 2016-03-19 – 2016-03-20 (×6): 2 mg via INTRAVENOUS
  Filled 2016-03-19 (×6): qty 1

## 2016-03-19 MED ORDER — LORAZEPAM 1 MG PO TABS: 1.0000 mg | ORAL_TABLET | Freq: Four times a day (QID) | ORAL | Status: DC | PRN

## 2016-03-19 MED ORDER — ESCITALOPRAM OXALATE 10 MG PO TABS
10.0000 mg | ORAL_TABLET | Freq: Every day | ORAL | Status: DC
  Administered 2016-03-20 – 2016-03-21 (×2): 10 mg via ORAL
  Filled 2016-03-19 (×2): qty 1

## 2016-03-19 MED ORDER — SODIUM CHLORIDE 0.9 % IV SOLN
INTRAVENOUS | Status: DC
  Administered 2016-03-19 – 2016-03-20 (×4): via INTRAVENOUS

## 2016-03-19 MED ORDER — SODIUM CHLORIDE 0.9 % IV SOLN: INTRAVENOUS | Status: DC

## 2016-03-19 MED ORDER — FOLIC ACID 1 MG PO TABS
1.0000 mg | ORAL_TABLET | Freq: Every day | ORAL | Status: DC
  Administered 2016-03-20 – 2016-03-21 (×2): 1 mg via ORAL
  Filled 2016-03-19 (×2): qty 1

## 2016-03-19 MED ORDER — TRAMADOL HCL 50 MG PO TABS: 50.0000 mg | ORAL_TABLET | Freq: Four times a day (QID) | ORAL | Status: DC | PRN

## 2016-03-19 MED ORDER — PANTOPRAZOLE SODIUM 40 MG IV SOLR: 40.0000 mg | Freq: Two times a day (BID) | INTRAVENOUS | Status: DC

## 2016-03-19 MED ORDER — LORAZEPAM 2 MG/ML IJ SOLN: 1.0000 mg | Freq: Four times a day (QID) | INTRAMUSCULAR | Status: DC | PRN

## 2016-03-19 MED ORDER — SODIUM CHLORIDE 0.9% FLUSH
3.0000 mL | Freq: Two times a day (BID) | INTRAVENOUS | Status: DC
  Administered 2016-03-19 – 2016-03-21 (×2): 3 mL via INTRAVENOUS

## 2016-03-19 MED ORDER — VITAMIN B-1 100 MG PO TABS
100.0000 mg | ORAL_TABLET | Freq: Every day | ORAL | Status: DC
  Administered 2016-03-20 – 2016-03-21 (×2): 100 mg via ORAL
  Filled 2016-03-19 (×2): qty 1

## 2016-03-19 MED ORDER — ONDANSETRON HCL 4 MG PO TABS: 4.0000 mg | ORAL_TABLET | Freq: Four times a day (QID) | ORAL | Status: DC | PRN

## 2016-03-19 MED ORDER — SODIUM CHLORIDE 0.9 % IV BOLUS (SEPSIS)
500.0000 mL | Freq: Once | INTRAVENOUS | Status: AC
  Administered 2016-03-19: 500 mL via INTRAVENOUS

## 2016-03-19 MED ORDER — TEMAZEPAM 15 MG PO CAPS
15.0000 mg | ORAL_CAPSULE | Freq: Every day | ORAL | Status: DC
  Administered 2016-03-19 – 2016-03-20 (×2): 15 mg via ORAL
  Filled 2016-03-19 (×2): qty 1

## 2016-03-19 NOTE — H&P (Signed)
History and Physical    Theodor Mustin ZOX:[id number] DOB: [birth year] DOA: 03/19/2016  PCP: Margaree Mackintosh, MD   Patient coming from: Home  Chief Complaint: Dizziness, hematemesis, melena  HPI: John [name] is a 55 y.o. gentleman with a history of HTN, osteoarthritis, and EtOH dependence who presented to the ED in [facility] for evaluation of hematemesis, melena, and dizziness.  The patient reports multiple right rib fractures after an accidental fall four weeks ago.  He was given a prescription for Percocet, but family members were concerned about him taking this medication due to his history of EtOH dependence.  He started using OTC Aleve, which helped with his pain, but he admits that he was taking more than the recommended daily dose, and he was taking the medication on an empty stomach and with EtOH.  Two days ago he developed self-limited hematemesis.  In the past 24 hours, he has developed melena.  He presented to the ED this morning for evaluation.  He has had dizziness but no LOC.  No chest pain, pressure, or tightness.  No shortness of breath.  He still has right flank pain that he attributes to his rib fractures.  ED Course: The patient received IV fluids and IV pepcid before being transferred to [facility] for admission.  Review of Systems: As per HPI otherwise 10 point review of systems negative.    Past Medical History  Diagnosis Date  . Anxiety     Ativan  . Depression     history of  . Right hand fracture   . History of hypertension   . Sleep apnea     wears a CPAP  . Headache   . Chronic left hip pain   . Osteoarthritis     right shoulder  . Wears glasses   . Seasonal allergies     Past Surgical History  Procedure Laterality Date  . Wisdom tooth extraction    . Total hip arthroplasty Left 03/27/2015    Procedure: LEFT TOTAL HIP ARTHROPLASTY ANTERIOR APPROACH;  Surgeon: Samson Frederic, MD;  Location: [facility] OR;  Service: Orthopedics;  Laterality: Left;  . Total  shoulder arthroplasty Right 11/24/2015    Procedure: RIGHT TOTAL SHOULDER ARTHROPLASTY;  Surgeon: Beverely Low, MD;  Location: [facility] OR;  Service: Orthopedics;  Laterality: Right;     reports that he has never smoked. He has never used smokeless tobacco. He reports that he drinks about 1.8 oz of alcohol per week. He reports that he does not use illicit drugs.  He admits to EtOH dependence.  He has used PRN ativan in the past to manage tremors.  He denies hallucinations. He is divorced.  He has three sons.  He is from [place], [place].  No Known Allergies  No family history on file.   Prior to Admission medications   Medication Sig Start Date End Date Taking? Authorizing Provider  escitalopram (LEXAPRO) 10 MG tablet TAKE 1 TABLET (10 MG TOTAL) BY MOUTH DAILY. 10/31/15  Yes Historical Provider, MD  LORazepam (ATIVAN) 1 MG tablet Take 0.5-1 mg by mouth 3 (three) times daily as needed. 03/10/15  Yes Historical Provider, MD  Multiple Vitamins-Minerals (MULTIVITAMIN WITH MINERALS) tablet Take 1 tablet by mouth daily.   Yes Historical Provider, MD  oxyCODONE-acetaminophen (PERCOCET/ROXICET) 5-325 MG tablet Take 1-2 tablets by mouth every 4 (four) hours as needed for severe pain. 02/22/16   Emi Holes, PA-C    Physical Exam: Filed Vitals:   03/19/16 8119 03/19/16 1478 03/19/16 1019 03/19/16  1149  BP: 144/93 125/78 127/69 135/74  Pulse: 76 87 89 104  Temp: 98.6 F (37 C)   99.3 F (37.4 C)  TempSrc: Oral   Oral  Resp: 18 21 17 19   Weight: 97.523 kg (215 lb)     SpO2: 100% 100% 100% 100%      Constitutional: NAD, calm, comfortable, ambulated from the restroom in his room without difficulty Filed Vitals:   03/19/16 0738 03/19/16 0943 03/19/16 1019 03/19/16 1149  BP: 144/93 125/78 127/69 135/74  Pulse: 76 87 89 104  Temp: 98.6 F (37 C)   99.3 F (37.4 C)  TempSrc: Oral   Oral  Resp: 18 21 17 19   Weight: 97.523 kg (215 lb)     SpO2: 100% 100% 100% 100%   Eyes: PERRL, lids and  conjunctivae normal ENMT: Mucous membranes are dry. Posterior pharynx clear of any exudate or lesions. Normal dentition.  Neck: normal appearance, supple, no masses Respiratory: clear to auscultation bilaterally, no wheezing, no crackles. Normal respiratory effort. No accessory muscle use.  Cardiovascular: Mildly tachycardic but regular.  Normal rate, regular rhythm, no murmurs / rubs / gallops. No extremity edema. 2+ pedal pulses. GI: abdomen is soft and compressible.  No distention.  No tenderness.  No masses palpated.  Bowel sounds are present. Musculoskeletal:  No joint deformity in upper and lower extremities. Good ROM, no contractures. Normal muscle tone.  Skin: no rashes, warm and dry Neurologic: No focal deficits. Psychiatric: Normal judgment and insight. Alert and oriented x 3. Normal mood.     Labs on Admission: I have personally reviewed following labs and imaging studies  CBC:  Recent Labs Lab 03/19/16 0850  WBC 9.9  NEUTROABS 7.1  HGB 12.2*  HCT 36.0*  MCV 100.3*  PLT 210   Basic Metabolic Panel:  Recent Labs Lab 03/19/16 0850  NA 135  K 3.9  CL 107  CO2 22  GLUCOSE 112*  BUN 34*  CREATININE 0.68  CALCIUM 8.9   GFR: Estimated Creatinine Clearance: 125.7 mL/min (by C-G formula based on Cr of 0.68). Liver Function Tests:  Recent Labs Lab 03/19/16 0850  AST 20  ALT 19  ALKPHOS 91  BILITOT 1.0  PROT 6.3*  ALBUMIN 3.6    Recent Labs Lab 03/19/16 0850  LIPASE 15   Cardiac Enzymes:  Recent Labs Lab 03/19/16 0825  TROPONINI 0.05*   Urine analysis:    Component Value Date/Time   COLORURINE ORANGE* 03/21/2015 0903   APPEARANCEUR CLOUDY* 03/21/2015 0903   LABSPEC 1.028 03/21/2015 0903   PHURINE 5.0 03/21/2015 0903   GLUCOSEU NEGATIVE 03/21/2015 0903   HGBUR NEGATIVE 03/21/2015 0903   BILIRUBINUR SMALL* 03/21/2015 0903   KETONESUR 15* 03/21/2015 0903   PROTEINUR NEGATIVE 03/21/2015 0903   UROBILINOGEN 0.2 03/21/2015 0903   NITRITE  NEGATIVE 03/21/2015 0903   LEUKOCYTESUR SMALL* 03/21/2015 0903    Radiological Exams on Admission: Dg Chest 2 View  03/19/2016  CLINICAL DATA:  Right side chest pain, rib fractures EXAM: CHEST  2 VIEW COMPARISON:  02/22/2016 FINDINGS: Probable healing rib fractures on the right with associated pleural thickening. Lungs otherwise clear. Heart is normal size. No effusions or pneumothorax. IMPRESSION: Healing right rib fractures with pleural thickening. Findings similar to prior study. Electronically Signed   By: Charlett Nose M.D.   On: 03/19/2016 08:28    EKG: Independently reviewed. No acute ST segment elevations.  NSR.  Assessment/Plan Principal Problem:   GI bleed Active Problems:   Elevated troponin  Acute blood loss anemia   EtOH dependence (HCC)   Acute upper GI bleed      Acute blood loss anemia secondary to acute upper GI bleeding.  History of Aleve misuse. --Admit to telemetry --IV protonix bolus and infusion --IV fluids --Serial H/H, type and screen, monitor for transfusion requirement --Dr. Bosie Clos with [facility] to see, anticipate EGD tomorrow --Avoid NSAIDs  Elevated troponin of unclear significance.  Suspect demand ischemia secondary to tachycardia in the setting of acute blood loss anemia.  Low index of suspicion for ACS. --Telemetry monitoring --Repeat EKG --Serial troponin to establish trend  History of EtOH dependence --CIWA protocol --Will try to avoid opioid narcotic use per family request  Subacute rib pain --Lidoderm patch to right flank --Acetaminophen, tramadol --Avoid NSAIDs due to GI bleeding   DVT prophylaxis: SCDs Code Status: FULL Family Communication: Spoke to patient's sister and brother-in-law by phone, per his request.  Questions answered to their apparent satisfaction. Disposition Plan: Expect he will discharge home in 1-2 days.  Consults called: [facility], Dr. Bosie Clos Admission status: Observation, telemetry   TIME SPENT:  55  minutes   Jerene Bears MD [facility] Pager [phone]  If 7PM-7AM, please contact night-coverage [email or web address] Password [email or web address]  03/19/2016, 1:29 PM

## 2016-03-19 NOTE — ED Notes (Signed)
Attempted to call report x 1  

## 2016-03-19 NOTE — ED Notes (Signed)
MD at bedside. 

## 2016-03-19 NOTE — ED Notes (Signed)
Via [facility]-spoke with Doug 

## 2016-03-19 NOTE — ED Provider Notes (Signed)
Emergency Department Provider Note ____________________________________________  Time seen: Approximately 7:38 AM  I have reviewed the triage vital signs and the nursing notes.   HISTORY  Chief Complaint Hematemesis and Diarrhea   HPI Akeen Ledyard is a 55 y.o. male with PMH of depression, anxiety, and OSA presents to the ED today with continued right sided chest pain for the last month and new onset hematemesis and black diarrhea for the last 2 days. Chest pain and back pain started in the setting of fall one month prior. He has been taking "a lot of Aleve" but has difficulty describing exactly how much. His one episode of hematemesis was BRB. Denies coffee grounds. Developed diarrhea last night with multiple episodes and describes it as black. No SOB. Denies abdominal pain.   Past Medical History  Diagnosis Date  . Anxiety     Ativan  . Depression     history of  . Right hand fracture   . History of hypertension   . Sleep apnea     wears a CPAP  . Headache   . Chronic left hip pain   . Osteoarthritis     right shoulder  . Wears glasses   . Seasonal allergies     Patient Active Problem List   Diagnosis Date Noted  . GI bleed 03/19/2016  . S/P shoulder replacement 11/24/2015  . Elevated transaminase level 03/27/2015  . Degenerative joint disease of left hip 03/27/2015    Past Surgical History  Procedure Laterality Date  . Wisdom tooth extraction    . Total hip arthroplasty Left 03/27/2015    Procedure: LEFT TOTAL HIP ARTHROPLASTY ANTERIOR APPROACH;  Surgeon: Samson Frederic, MD;  Location: [facility] OR;  Service: Orthopedics;  Laterality: Left;  . Total shoulder arthroplasty Right 11/24/2015    Procedure: RIGHT TOTAL SHOULDER ARTHROPLASTY;  Surgeon: Beverely Low, MD;  Location: [facility] OR;  Service: Orthopedics;  Laterality: Right;    Current Outpatient Rx  Name  Route  Sig  Dispense  Refill  . escitalopram (LEXAPRO) 10 MG tablet      TAKE 1 TABLET (10 MG TOTAL) BY  MOUTH DAILY.      2   . LORazepam (ATIVAN) 1 MG tablet   Oral   Take 0.5-1 mg by mouth 3 (three) times daily as needed.      0   . Multiple Vitamins-Minerals (MULTIVITAMIN WITH MINERALS) tablet   Oral   Take 1 tablet by mouth daily.         Marland Kitchen oxyCODONE-acetaminophen (PERCOCET/ROXICET) 5-325 MG tablet   Oral   Take 1-2 tablets by mouth every 4 (four) hours as needed for severe pain.   10 tablet   0     Allergies Review of patient's allergies indicates no known allergies.  No family history on file.  Social History Social History  Substance Use Topics  . Smoking status: Never Smoker   . Smokeless tobacco: Never Used  . Alcohol Use: 1.8 oz/week    3 Cans of beer per week     Comment: occasional    Review of Systems Constitutional: No fever/chills Eyes: No visual changes. ENT: No sore throat. Cardiovascular: Right sided chest pain. Respiratory: Denies shortness of breath. Gastrointestinal: No abdominal pain.  No nausea. No constipation. Positive diarrhea (black) and hematemesis x 1.  Genitourinary: Negative for dysuria. Musculoskeletal: Positive back and chest pain x 1 month.  Skin: Negative for rash. Neurological: Negative for headaches, focal weakness or numbness. 10-point ROS otherwise negative.  ____________________________________________  PHYSICAL EXAM:  VITAL SIGNS: Temp: 98.63F RR: 18 SpO2: 100% Pulse: 76 BP: 144/93  Constitutional: Alert and oriented. Well appearing and in no acute distress. Eyes: Conjunctivae are normal. EOMI. Head: Atraumatic. Ears:  Healthy appearing ear canals Nose: No congestion/rhinnorhea. Mouth/Throat: Mucous membranes are moist.  Oropharynx non-erythematous. Neck: No stridor. Grossly normal ROM.  Cardiovascular: Normal rate, regular rhythm. Good peripheral circulation. Grossly normal heart sounds.   Respiratory: Normal respiratory effort.  No retractions. Lungs CTAB. Gastrointestinal: Soft and nontender. No  distention. Black liquid stool on DRE.  Musculoskeletal: No lower extremity tenderness nor edema. No gross deformities of extremities. Neurologic:  Normal speech and language. No gross focal neurologic deficits are appreciated.  Skin:  Skin is warm, dry and intact. No rash noted. Psychiatric: Mood and affect are normal. Speech and behavior are normal.  ____________________________________________   LABS (all labs ordered are listed, but only abnormal results are displayed)  Labs Reviewed  TROPONIN I - Abnormal; Notable for the following:    Troponin I 0.05 (*)    All other components within normal limits  OCCULT BLOOD X 1 CARD TO LAB, STOOL - Abnormal; Notable for the following:    Fecal Occult Bld POSITIVE (*)    All other components within normal limits  COMPREHENSIVE METABOLIC PANEL - Abnormal; Notable for the following:    Glucose, Bld 112 (*)    BUN 34 (*)    Total Protein 6.3 (*)    All other components within normal limits  CBC WITH DIFFERENTIAL/PLATELET - Abnormal; Notable for the following:    RBC 3.59 (*)    Hemoglobin 12.2 (*)    HCT 36.0 (*)    MCV 100.3 (*)    All other components within normal limits  LIPASE, BLOOD  CBC WITH DIFFERENTIAL/PLATELET   ____________________________________________  EKG  Reviewed and posted in MUSE. No stemi. Nonspecific T wave changes inferiorly.  ____________________________________________  RADIOLOGY  FINDINGS: Probable healing rib fractures on the right with associated pleural thickening. Lungs otherwise clear. Heart is normal size. No effusions or pneumothorax.  IMPRESSION: Healing right rib fractures with pleural thickening. Findings similar to prior study.   Electronically Signed By: Charlett Nose M.D. On: 03/19/2016 08:28  ____________________________________________   PROCEDURES  Procedure(s) performed:   Procedures   ____________________________________________   INITIAL IMPRESSION / ASSESSMENT  AND PLAN / ED COURSE  Pertinent labs & imaging results that were available during my care of the patient were reviewed by me and considered in my medical decision making (see chart for details).  Patient presents to the ED for evaluation of right sided chest pain and back pain for the last month in the setting of fall. He has been taking large quantities of NSAIDs and now reports hematemesis x 1 and multiple episodes of back diarrhea for the last 24 hours. Patient is well-appearing on exam with no abdominal tenderness. Rectal exam with back, liquid stool. Sent for hemoccult to lab. Will obtain labs, CXR to r/o mediastinal injury or perforation. Very low suspicion for this. Will obtain EKG and troponin but with 1 month of chest/back pain in the setting of injury I have low suspicion for ACS.    ____________________________________________  FINAL CLINICAL IMPRESSION(S) / ED DIAGNOSES  Final diagnoses:  Gastrointestinal hemorrhage, unspecified gastritis, unspecified gastrointestinal hemorrhage type  Elevated troponin   Paged medicine team regarding admission for GI bleed. Suspect that elevated troponin is secondary to demand ischemia from GI bleed. Would need enzyme trending and repeat EKG if pain continues  or worsens.   10:06 AM Discussed case with Dr. Montez Morita at [facility]. She will accept to telemetry on inpatient status. Updated family by phone with patient consent.   Discussed patient's case with Hospitalist, Dr. Montez Morita.  Recommend admission to inpatient, telemetry bed.  I will place holding orders per their request. Patient and family (if present) updated with plan. Care transferred to hospitalist service.  I reviewed all nursing notes, vitals, pertinent old records, EKGs, labs, imaging (as available).   MEDICATIONS GIVEN DURING THIS VISIT:  Medications  famotidine (PEPCID) IVPB 20 mg premix (0 mg Intravenous Stopped 03/19/16 0930)  sodium chloride 0.9 % bolus 500 mL (0 mLs Intravenous  Stopped 03/19/16 1010)     NEW OUTPATIENT MEDICATIONS STARTED DURING THIS VISIT:   Note:  This document was prepared using Dragon voice recognition software and may include unintentional dictation errors.  Alona Bene, MD Emergency Medicine  Maia Plan, MD 03/19/16 1028

## 2016-03-19 NOTE — ED Notes (Signed)
Pt reports 7-8 episodes of black diarrhea and 1 episode of vomiting bright red blood since yesterday. Pt also reports R side rib pain from rib fx 1 month ago. Pt reports taking 6 aleve per day for the rib pain.

## 2016-03-19 NOTE — Consult Note (Signed)
Referring Provider: Dr. Montez Morita Primary Care Physician:  Margaree Mackintosh, MD Primary Gastroenterologist:  Gentry Fitz  Reason for Consultation:  Melena  HPI: John [name] is a 55 y.o. male with acute onset of black loose stools X 2 days and episode of vomiting red blood once at home. Felt dizzy yesterday as well. Denies abdominal pain, hematochezia. Has been taking multiple Aleve daily for a month due to fractured rib pain. Also, drinks a 6 pack of beer per day for years. No history of previous bleeding or ulcers. Denies chest pain or shortness of breath. Hgb 11.4 at 1pm (12.2 at 850AM; 13.3 in March 2017). Has never had a colonoscopy.   Past Medical History  Diagnosis Date  . Anxiety     Ativan  . Depression     history of  . Right hand fracture   . History of hypertension   . Sleep apnea     wears a CPAP  . Headache   . Chronic left hip pain   . Osteoarthritis     right shoulder  . Wears glasses   . Seasonal allergies   . GI bleed 03/2016    Past Surgical History  Procedure Laterality Date  . Wisdom tooth extraction    . Total hip arthroplasty Left 03/27/2015    Procedure: LEFT TOTAL HIP ARTHROPLASTY ANTERIOR APPROACH;  Surgeon: Samson Frederic, MD;  Location: [facility] OR;  Service: Orthopedics;  Laterality: Left;  . Total shoulder arthroplasty Right 11/24/2015    Procedure: RIGHT TOTAL SHOULDER ARTHROPLASTY;  Surgeon: Beverely Low, MD;  Location: [facility] OR;  Service: Orthopedics;  Laterality: Right;    Prior to Admission medications   Medication Sig Start Date End Date Taking? Authorizing Provider  escitalopram (LEXAPRO) 10 MG tablet TAKE 1 TABLET (10 MG TOTAL) BY MOUTH DAILY. 10/31/15  Yes Historical Provider, MD  LORazepam (ATIVAN) 1 MG tablet Take 0.5-1 mg by mouth 3 (three) times daily as needed. 03/10/15  Yes Historical Provider, MD  Multiple Vitamins-Minerals (MULTIVITAMIN WITH MINERALS) tablet Take 1 tablet by mouth daily.   Yes Historical Provider, MD  oxyCODONE-acetaminophen  (PERCOCET/ROXICET) 5-325 MG tablet Take 1-2 tablets by mouth every 4 (four) hours as needed for severe pain. 02/22/16   Emi Holes, PA-C    Scheduled Meds: . Melene Muller ON 03/20/2016] escitalopram  10 mg Oral Daily  . [START ON 03/20/2016] folic acid  1 mg Oral Daily  . lidocaine  1 patch Transdermal Q24H  . [START ON 03/20/2016] multivitamin with minerals  1 tablet Oral Daily  . pantoprazole (PROTONIX) IVPB  80 mg Intravenous Once  . [START ON 03/23/2016] pantoprazole  40 mg Intravenous Q12H  . banana bag IV 1000 mL   Intravenous Once  . sodium chloride  500 mL Intravenous Once  . sodium chloride flush  3 mL Intravenous Q12H  . [START ON 03/20/2016] thiamine  100 mg Oral Daily   Or  . [START ON 03/20/2016] thiamine  100 mg Intravenous Daily   Continuous Infusions: . sodium chloride    . pantoprozole (PROTONIX) infusion     PRN Meds:.acetaminophen **OR** acetaminophen, LORazepam **OR** LORazepam, ondansetron **OR** ondansetron (ZOFRAN) IV, traMADol  Allergies as of 03/19/2016  . (No Known Allergies)    History reviewed. No pertinent family history.  Social History   Social History  . Marital Status: Divorced    Spouse Name: N/A  . Number of Children: N/A  . Years of Education: N/A   Occupational History  . Not on file.   Social  History Main Topics  . Smoking status: Never Smoker   . Smokeless tobacco: Never Used  . Alcohol Use: 1.8 oz/week    3 Cans of beer per week     Comment: occasional  . Drug Use: No  . Sexual Activity: Not on file   Other Topics Concern  . Not on file   Social History Narrative    Review of Systems: All negative except as stated above in HPI.  Physical Exam: Vital signs: Filed Vitals:   03/19/16 1019 03/19/16 1149  BP: 127/69 135/74  Pulse: 89 104  Temp:  99.3 F (37.4 C)  Resp: 17 19     General:   Alert,  Well-developed, well-nourished, pleasant and cooperative in NAD Head: atraumatic Eyes: anicteric sclera ENT: oropharynx  clear Neck: supple, nontender Lungs:  Clear throughout to auscultation.   No wheezes, crackles, or rhonchi. No acute distress. Heart:  Regular rate and rhythm; no murmurs, clicks, rubs,  or gallops. Abdomen: soft, nontender, nondistended, +BS  Rectal:  Deferred Ext: no edema Skin: no rash  GI:  Lab Results:  Recent Labs  03/19/16 0850 03/19/16 1305  WBC 9.9  --   HGB 12.2* 11.4*  HCT 36.0* 34.6*  PLT 210  --    BMET  Recent Labs  03/19/16 0850  NA 135  K 3.9  CL 107  CO2 22  GLUCOSE 112*  BUN 34*  CREATININE 0.68  CALCIUM 8.9   LFT  Recent Labs  03/19/16 0850  PROT 6.3*  ALBUMIN 3.6  AST 20  ALT 19  ALKPHOS 91  BILITOT 1.0   PT/INR  Recent Labs  03/19/16 1325  LABPROT 14.2  INR 1.08     Studies/Results: Dg Chest 2 View  03/19/2016  CLINICAL DATA:  Right side chest pain, rib fractures EXAM: CHEST  2 VIEW COMPARISON:  02/22/2016 FINDINGS: Probable healing rib fractures on the right with associated pleural thickening. Lungs otherwise clear. Heart is normal size. No effusions or pneumothorax. IMPRESSION: Healing right rib fractures with pleural thickening. Findings similar to prior study. Electronically Signed   By: Charlett Nose M.D.   On: 03/19/2016 08:28    Impression/Plan: 55 yo with melena and one episode of hematemesis in the setting of NSAIDs and alcohol abuse. Suspect peptic ulcer disease vs alcoholic gastritis vs esophagitis. Doubt malignancy. No evidence of ongoing bleeding. Hemodynamically stable. Agree with alcohol withdrawal protocol because high risk for withdrawal. Agree with Protonix drip. Clear liquid diet. NPO p MN. EGD tomorrow with Diprivan sedation. Will need outpt colonoscopy unless no bleeding source for melena found on EGD and then will need inpt colon.    LOS: 0 days   Crytal Pensinger C.  03/19/2016, 2:46 PM  Pager [phone]  If no answer or after 5 PM call [phone]

## 2016-03-19 NOTE — ED Notes (Signed)
Call from [facility] in the lab, states the blood needs to be redrawn, as there is a clot in the blood tube and all others are hemolyzed.

## 2016-03-19 NOTE — ED Notes (Signed)
[facility] is aware of bed at [facility], they are in route to transfer patient to room 5W23.

## 2016-03-20 ENCOUNTER — Observation Stay (HOSPITAL_COMMUNITY): Payer: Self-pay | Admitting: Certified Registered"

## 2016-03-20 ENCOUNTER — Encounter (HOSPITAL_COMMUNITY)
Admission: EM | Disposition: A | Discharge status: Home / Self Care | Payer: Self-pay | Source: Home / Self Care | Attending: Internal Medicine

## 2016-03-20 ENCOUNTER — Encounter (HOSPITAL_COMMUNITY): Payer: Self-pay

## 2016-03-20 DIAGNOSIS — K922 Gastrointestinal hemorrhage, unspecified: Secondary | ICD-10-CM

## 2016-03-20 DIAGNOSIS — F1020 Alcohol dependence, uncomplicated: Secondary | ICD-10-CM

## 2016-03-20 DIAGNOSIS — R7989 Other specified abnormal findings of blood chemistry: Secondary | ICD-10-CM

## 2016-03-20 DIAGNOSIS — D62 Acute posthemorrhagic anemia: Secondary | ICD-10-CM

## 2016-03-20 HISTORY — PX: ESOPHAGOGASTRODUODENOSCOPY (EGD) WITH PROPOFOL: SHX5813

## 2016-03-20 LAB — BASIC METABOLIC PANEL
ANION GAP: 4 — AB (ref 5–15)
BUN: 16 mg/dL (ref 6–20)
CALCIUM: 8.7 mg/dL — AB (ref 8.9–10.3)
CHLORIDE: 108 mmol/L (ref 101–111)
CO2: 25 mmol/L (ref 22–32)
CREATININE: 0.84 mg/dL (ref 0.61–1.24)
GFR calc Af Amer: >60 mL/min (ref >60)
GFR calc non Af Amer: >60 mL/min (ref >60)
GLUCOSE: 105 mg/dL — AB (ref 65–99)
Potassium: 4.2 mmol/L (ref 3.5–5.1)
Sodium: 137 mmol/L (ref 135–145)

## 2016-03-20 LAB — HEMOGLOBIN AND HEMATOCRIT, BLOOD
HCT: 30.9 % — ABNORMAL LOW (ref 39.0–52.0)
HEMATOCRIT: 29.8 % — AB (ref 39.0–52.0)
HEMOGLOBIN: 10.1 g/dL — AB (ref 13.0–17.0)
HEMOGLOBIN: 9.5 g/dL — AB (ref 13.0–17.0)

## 2016-03-20 LAB — TROPONIN I: Troponin I: 0.05 ng/mL (ref <0.03)

## 2016-03-20 SURGERY — ESOPHAGOGASTRODUODENOSCOPY (EGD) WITH PROPOFOL
Anesthesia: Monitor Anesthesia Care

## 2016-03-20 MED ORDER — PROPOFOL 500 MG/50ML IV EMUL
INTRAVENOUS | Status: DC | PRN
  Administered 2016-03-20: 50 ug/kg/min via INTRAVENOUS

## 2016-03-20 MED ORDER — PROPOFOL 10 MG/ML IV BOLUS
INTRAVENOUS | Status: DC | PRN
  Administered 2016-03-20 (×2): 20 mg via INTRAVENOUS
  Administered 2016-03-20 (×2): 40 mg via INTRAVENOUS
  Administered 2016-03-20 (×4): 20 mg via INTRAVENOUS

## 2016-03-20 MED ORDER — BUTAMBEN-TETRACAINE-BENZOCAINE 2-2-14 % EX AERO
INHALATION_SPRAY | CUTANEOUS | Status: DC | PRN
  Administered 2016-03-20: 1 via TOPICAL

## 2016-03-20 MED ORDER — SODIUM CHLORIDE 0.9 % IV SOLN: INTRAVENOUS | Status: DC

## 2016-03-20 MED ORDER — LACTATED RINGERS IV SOLN
INTRAVENOUS | Status: DC
  Administered 2016-03-20: 11:00:00 via INTRAVENOUS

## 2016-03-20 NOTE — Anesthesia Preprocedure Evaluation (Addendum)
Anesthesia Evaluation  Patient identified by MRN, date of birth, ID band Patient awake    Reviewed: Allergy & Precautions, NPO status , Patient's Chart, lab work & pertinent test results  Airway Mallampati: II  TM Distance: >3 FB Neck ROM: Full    Dental  (+) Teeth Intact, Dental Advisory Given   Pulmonary    breath sounds clear to auscultation       Cardiovascular  Rhythm:Regular Rate:Normal     Neuro/Psych    GI/Hepatic   Endo/Other    Renal/GU      Musculoskeletal   Abdominal   Peds  Hematology   Anesthesia Other Findings   Reproductive/Obstetrics                             Anesthesia Physical Anesthesia Plan  ASA: II  Anesthesia Plan: MAC   Post-op Pain Management:    Induction: Intravenous  Airway Management Planned: Natural Airway and Nasal Cannula  Additional Equipment:   Intra-op Plan:   Post-operative Plan:   Informed Consent: I have reviewed the patients History and Physical, chart, labs and discussed the procedure including the risks, benefits and alternatives for the proposed anesthesia with the patient or authorized representative who has indicated his/her understanding and acceptance.   Dental advisory given  Plan Discussed with: CRNA and Anesthesiologist  Anesthesia Plan Comments:         Anesthesia Quick Evaluation  

## 2016-03-20 NOTE — H&P (View-Only) (Signed)
Referring Provider: Dr. Montez Morita Primary Care Physician:  Margaree Mackintosh, MD Primary Gastroenterologist:  Gentry Fitz  Reason for Consultation:  Melena  HPI: John [name] is a 55 y.o. male with acute onset of black loose stools X 2 days and episode of vomiting red blood once at home. Felt dizzy yesterday as well. Denies abdominal pain, hematochezia. Has been taking multiple Aleve daily for a month due to fractured rib pain. Also, drinks a 6 pack of beer per day for years. No history of previous bleeding or ulcers. Denies chest pain or shortness of breath. Hgb 11.4 at 1pm (12.2 at 850AM; 13.3 in March 2017). Has never had a colonoscopy.   Past Medical History  Diagnosis Date  . Anxiety     Ativan  . Depression     history of  . Right hand fracture   . History of hypertension   . Sleep apnea     wears a CPAP  . Headache   . Chronic left hip pain   . Osteoarthritis     right shoulder  . Wears glasses   . Seasonal allergies   . GI bleed 03/2016    Past Surgical History  Procedure Laterality Date  . Wisdom tooth extraction    . Total hip arthroplasty Left 03/27/2015    Procedure: LEFT TOTAL HIP ARTHROPLASTY ANTERIOR APPROACH;  Surgeon: Samson Frederic, MD;  Location: [facility] OR;  Service: Orthopedics;  Laterality: Left;  . Total shoulder arthroplasty Right 11/24/2015    Procedure: RIGHT TOTAL SHOULDER ARTHROPLASTY;  Surgeon: Beverely Low, MD;  Location: [facility] OR;  Service: Orthopedics;  Laterality: Right;    Prior to Admission medications   Medication Sig Start Date End Date Taking? Authorizing Provider  escitalopram (LEXAPRO) 10 MG tablet TAKE 1 TABLET (10 MG TOTAL) BY MOUTH DAILY. 10/31/15  Yes Historical Provider, MD  LORazepam (ATIVAN) 1 MG tablet Take 0.5-1 mg by mouth 3 (three) times daily as needed. 03/10/15  Yes Historical Provider, MD  Multiple Vitamins-Minerals (MULTIVITAMIN WITH MINERALS) tablet Take 1 tablet by mouth daily.   Yes Historical Provider, MD  oxyCODONE-acetaminophen  (PERCOCET/ROXICET) 5-325 MG tablet Take 1-2 tablets by mouth every 4 (four) hours as needed for severe pain. 02/22/16   Emi Holes, PA-C    Scheduled Meds: . Melene Muller ON 03/20/2016] escitalopram  10 mg Oral Daily  . [START ON 03/20/2016] folic acid  1 mg Oral Daily  . lidocaine  1 patch Transdermal Q24H  . [START ON 03/20/2016] multivitamin with minerals  1 tablet Oral Daily  . pantoprazole (PROTONIX) IVPB  80 mg Intravenous Once  . [START ON 03/23/2016] pantoprazole  40 mg Intravenous Q12H  . banana bag IV 1000 mL   Intravenous Once  . sodium chloride  500 mL Intravenous Once  . sodium chloride flush  3 mL Intravenous Q12H  . [START ON 03/20/2016] thiamine  100 mg Oral Daily   Or  . [START ON 03/20/2016] thiamine  100 mg Intravenous Daily   Continuous Infusions: . sodium chloride    . pantoprozole (PROTONIX) infusion     PRN Meds:.acetaminophen **OR** acetaminophen, LORazepam **OR** LORazepam, ondansetron **OR** ondansetron (ZOFRAN) IV, traMADol  Allergies as of 03/19/2016  . (No Known Allergies)    History reviewed. No pertinent family history.  Social History   Social History  . Marital Status: Divorced    Spouse Name: N/A  . Number of Children: N/A  . Years of Education: N/A   Occupational History  . Not on file.   Social  History Main Topics  . Smoking status: Never Smoker   . Smokeless tobacco: Never Used  . Alcohol Use: 1.8 oz/week    3 Cans of beer per week     Comment: occasional  . Drug Use: No  . Sexual Activity: Not on file   Other Topics Concern  . Not on file   Social History Narrative    Review of Systems: All negative except as stated above in HPI.  Physical Exam: Vital signs: Filed Vitals:   03/19/16 1019 03/19/16 1149  BP: 127/69 135/74  Pulse: 89 104  Temp:  99.3 F (37.4 C)  Resp: 17 19     General:   Alert,  Well-developed, well-nourished, pleasant and cooperative in NAD Head: atraumatic Eyes: anicteric sclera ENT: oropharynx  clear Neck: supple, nontender Lungs:  Clear throughout to auscultation.   No wheezes, crackles, or rhonchi. No acute distress. Heart:  Regular rate and rhythm; no murmurs, clicks, rubs,  or gallops. Abdomen: soft, nontender, nondistended, +BS  Rectal:  Deferred Ext: no edema Skin: no rash  GI:  Lab Results:  Recent Labs  03/19/16 0850 03/19/16 1305  WBC 9.9  --   HGB 12.2* 11.4*  HCT 36.0* 34.6*  PLT 210  --    BMET  Recent Labs  03/19/16 0850  NA 135  K 3.9  CL 107  CO2 22  GLUCOSE 112*  BUN 34*  CREATININE 0.68  CALCIUM 8.9   LFT  Recent Labs  03/19/16 0850  PROT 6.3*  ALBUMIN 3.6  AST 20  ALT 19  ALKPHOS 91  BILITOT 1.0   PT/INR  Recent Labs  03/19/16 1325  LABPROT 14.2  INR 1.08     Studies/Results: Dg Chest 2 View  03/19/2016  CLINICAL DATA:  Right side chest pain, rib fractures EXAM: CHEST  2 VIEW COMPARISON:  02/22/2016 FINDINGS: Probable healing rib fractures on the right with associated pleural thickening. Lungs otherwise clear. Heart is normal size. No effusions or pneumothorax. IMPRESSION: Healing right rib fractures with pleural thickening. Findings similar to prior study. Electronically Signed   By: Kevin  Dover M.D.   On: 03/19/2016 08:28    Impression/Plan: 54 yo with melena and one episode of hematemesis in the setting of NSAIDs and alcohol abuse. Suspect peptic ulcer disease vs alcoholic gastritis vs esophagitis. Doubt malignancy. No evidence of ongoing bleeding. Hemodynamically stable. Agree with alcohol withdrawal protocol because high risk for withdrawal. Agree with Protonix drip. Clear liquid diet. NPO p MN. EGD tomorrow with Diprivan sedation. Will need outpt colonoscopy unless no bleeding source for melena found on EGD and then will need inpt colon.    LOS: 0 days   Selam Pietsch C.  03/19/2016, 2:46 PM  Pager [phone]  If no answer or after 5 PM call [phone] 

## 2016-03-20 NOTE — Op Note (Signed)
[facility] Patient Name: John [name] Procedure Date : 03/20/2016 MRN: [id number] Attending MD: Shirley Friar , MD Date of Birth: June 08, 1961 CSN: [id number] Age: 55 Admit Type: Outpatient Procedure:                Upper GI endoscopy Indications:              Melena Providers:                Shirley Friar, MD, Dwain Sarna, RN,                            Kandice Robinsons, Technician Referring MD:              Medicines:                Propofol per Anesthesia, Monitored Anesthesia Care Complications:            No immediate complications. Estimated Blood Loss:     Estimated blood loss: none. Procedure:                Pre-Anesthesia Assessment:                           - Prior to the procedure, a History and Physical                            was performed, and patient medications and                            allergies were reviewed. The patient's tolerance of                            previous anesthesia was also reviewed. The risks                            and benefits of the procedure and the sedation                            options and risks were discussed with the patient.                            All questions were answered, and informed consent                            was obtained. Prior Anticoagulants: The patient has                            taken no previous anticoagulant or antiplatelet                            agents. ASA Grade Assessment: II - A patient with                            mild systemic disease. After reviewing the [facility]  and benefits, the patient was deemed in                            satisfactory condition to undergo the procedure.                           After obtaining informed consent, the endoscope was                            passed under direct vision. Throughout the                            procedure, the patient's blood pressure, pulse, and   oxygen saturations were monitored continuously. The                            [id number] ([id number]) scope was introduced through the                            mouth, and advanced to the second part of duodenum.                            The upper GI endoscopy was accomplished without                            difficulty. The patient tolerated the procedure                            well. Scope In: Scope Out: Findings:      LA Grade C (one or more mucosal breaks continuous between tops of 2 or       more mucosal folds, less than 75% circumference) esophagitis with       bleeding was found in the distal esophagus.      The Z-line was found 42 cm from the incisors.      One non-bleeding superficial gastric ulcer with pigmented material was       found in the cardia. The lesion was 5 mm in largest dimension.      Segmental mild inflammation characterized by congestion (edema) and       erythema was found in the gastric antrum.      The examined duodenum was normal. Impression:               - LA Grade C reflux and acute esophagitis.                           - Z-line, 42 cm from the incisors.                           - Non-bleeding gastric ulcer with pigmented                            material.                           - Acute gastritis.                           -  Normal examined duodenum.                           - No specimens collected. Moderate Sedation:      N/A - MAC procedure Recommendation:           - Give Protonix (pantoprazole): 8 mg/hr IV by                            continuous infusion.                           - No ibuprofen, naproxen, or other non-steroidal                            anti-inflammatory drugs.                           - Advance diet as tolerated and full liquid diet. Procedure Code(s):        --- Professional ---                           [phone], Esophagogastroduodenoscopy, flexible,                            transoral; diagnostic, including  collection of                            specimen(s) by brushing or washing, when performed                            (separate procedure) Diagnosis Code(s):        --- Professional ---                           K92.1, Melena (includes Hematochezia)                           K21.0, Gastro-esophageal reflux disease with                            esophagitis                           K25.9, Gastric ulcer, unspecified as acute or                            chronic, without hemorrhage or perforation                           K29.00, Acute gastritis without bleeding CPT copyright 2016 American [employer] Association. All rights reserved. The codes documented in this report are preliminary and upon coder review may  be revised to meet current compliance requirements. Shirley Friar, MD 03/20/2016 12:29:30 PM This report has been signed electronically. Number of Addenda: 0

## 2016-03-20 NOTE — Transfer of Care (Signed)
Immediate Anesthesia Transfer of Care Note  Patient: John [name]  Procedure(s) Performed: Procedure(s): ESOPHAGOGASTRODUODENOSCOPY (EGD) WITH PROPOFOL (N/A)  Patient Location: Endoscopy Unit  Anesthesia Type:MAC  Level of Consciousness: awake, alert  and oriented  Airway & Oxygen Therapy: Patient Spontanous Breathing  Post-op Assessment: Report given to RN  Post vital signs: Reviewed and stable  Last Vitals:  Filed Vitals:   03/20/16 0649 03/20/16 1104  BP: 155/84 149/80  Pulse: 85 78  Temp: 37 C 36.9 C  Resp: 18 17    Last Pain:  Filed Vitals:   03/20/16 1105  PainSc: 8       Patients Stated Pain Goal: 2 (03/20/16 3785)  Complications: No apparent anesthesia complications

## 2016-03-20 NOTE — Interval H&P Note (Signed)
History and Physical Interval Note:  03/20/2016 11:53 AM  John [name]  has presented today for surgery, with the diagnosis of melena  The various methods of treatment have been discussed with the patient and family. After consideration of risks, benefits and other options for treatment, the patient has consented to  Procedure(s): ESOPHAGOGASTRODUODENOSCOPY (EGD) WITH PROPOFOL (N/A) as a surgical intervention .  The patient's history has been reviewed, patient examined, no change in status, stable for surgery.  I have reviewed the patient's chart and labs.  Questions were answered to the patient's satisfaction.     Jayleen Afonso C.

## 2016-03-20 NOTE — Anesthesia Postprocedure Evaluation (Signed)
Anesthesia Post Note  Patient: John [name]  Procedure(s) Performed: Procedure(s) (LRB): ESOPHAGOGASTRODUODENOSCOPY (EGD) WITH PROPOFOL (N/A)  Patient location during evaluation: PACU Anesthesia Type: MAC Level of consciousness: awake and alert Pain management: pain level controlled Vital Signs Assessment: post-procedure vital signs reviewed and stable Respiratory status: spontaneous breathing, nonlabored ventilation, respiratory function stable and patient connected to nasal cannula oxygen Cardiovascular status: stable and blood pressure returned to baseline Anesthetic complications: no    Last Vitals:  Filed Vitals:   03/20/16 1244 03/20/16 1424  BP: 138/72 156/93  Pulse: 78 92  Temp:    Resp: 17 18    Last Pain:  Filed Vitals:   03/20/16 1448  PainSc: 8                  Ailana Cuadrado S

## 2016-03-20 NOTE — Progress Notes (Signed)
Progress Note    John [name]  ZOX:[id number] DOB: [birth year]  DOA: 03/19/2016 PCP: Margaree Mackintosh, MD    Brief Narrative:   John [name] is an 55 y.o. male with a history of HTN, osteoarthritis, and EtOH dependence who presented to the ED in [facility] for evaluation of hematemesis, melena, and dizziness  Assessment/Plan:   Principal Problem:   GI bleed Active Problems:   Elevated troponin   Acute blood loss anemia   EtOH dependence (HCC)   Acute upper GI bleed  Anemia of blood loss from GI bleed: IV PPI, and serial hemoglobin checks.  Gi consulted and underwent EGD today, showed proximal ulcer with bleeding stigmata.  Liquid diet started.  Avoid NSAIDS'  Appreciate GI recommendations.  Plan for out patient colonoscopy.  Repeat hemoglobin tonight and transfuse to keep hemoglobin greater than 7.    Elevated troponin unclear etiology.  Repeat troponin tonight.  No chest pain.  Possibly Demand ischemia.  EKG repeat pending.   ETOH dependence: On CIWA.    RIB PAIN: Pain control with tylenol.  lidoderm patch.       Family Communication/Anticipated D/C date and plan/Code Status   DVT prophylaxis:scd's Code Status: Full Code.  Family Communication: none at bedside Disposition Plan: possible home in am.    Medical Consultants:   Gi.  Procedures:    Anti-Infectives:   Anti-infectives    None      Subjective:    John [name] denies any new complaints.   Objective:    Filed Vitals:   03/20/16 1230 03/20/16 1244 03/20/16 1424 03/20/16 1831  BP:  138/72 156/93 152/89  Pulse:  78 92 86  Temp:    98.3 F (36.8 C)  TempSrc:      Resp: Weight:      SpO2:  100% 99% 100%    Intake/Output Summary (Last 24 hours) at 03/20/16 1852 Last data filed at 03/20/16 1400  Gross per 24 hour  Intake   2430 ml  Output      0 ml  Net   2430 ml   Filed Weights   03/19/16 0738  Weight: 97.523 kg (215 lb)     Exam: General exam: Appears calm and comfortable.  Respiratory system: Clear to auscultation. Respiratory effort normal. Cardiovascular system: S1 & S2 heard, RRR. No JVD,  rubs, gallops or clicks. No murmurs. Gastrointestinal system: Abdomen is nondistended, soft and nontender. No organomegaly or masses felt. Normal bowel sounds heard. Central nervous system: Alert and oriented. No focal neurological deficits. Extremities: No clubbing, edema, or cyanosis. Skin: No rashes, lesions or ulcers Psychiatry: Judgement and insight appear normal. Mood & affect appropriate.   Data Reviewed:   I have personally reviewed following labs and imaging studies:  Labs: Basic Metabolic Panel:  Recent Labs Lab 03/19/16 0850 03/20/16 0057  NA 135 137  K 3.9 4.2  CL 107 108  CO2 22 25  GLUCOSE 112* 105*  BUN 34* 16  CREATININE 0.68 0.84  CALCIUM 8.9 8.7*   GFR Estimated Creatinine Clearance: 119.7 mL/min (by C-G formula based on Cr of 0.84). Liver Function Tests:  Recent Labs Lab 03/19/16 0850  AST 20  ALT 19  ALKPHOS 91  BILITOT 1.0  PROT 6.3*  ALBUMIN 3.6    Recent Labs Lab 03/19/16 0850  LIPASE 15   No results for input(s): AMMONIA in the last 168 hours. Coagulation profile  Recent Labs Lab 03/19/16 1325  INR 1.08  CBC:  Recent Labs Lab 03/19/16 0850 03/19/16 1305 03/19/16 1847 03/20/16 0057  WBC 9.9  --   --   --   NEUTROABS 7.1  --   --   --   HGB 12.2* 11.4* 10.7* 10.1*  HCT 36.0* 34.6* 33.3* 30.9*  MCV 100.3*  --   --   --   PLT 210  --   --   --    Cardiac Enzymes:  Recent Labs Lab 03/19/16 0825 03/19/16 1306 03/19/16 1847 03/20/16 0057  TROPONINI 0.05* 0.04* 0.05* 0.05*   BNP (last 3 results) No results for input(s): PROBNP in the last 8760 hours. CBG: No results for input(s): GLUCAP in the last 168 hours. D-Dimer: No results for input(s): DDIMER in the last 72 hours. Hgb A1c: No results for input(s): HGBA1C in the last 72  hours. Lipid Profile: No results for input(s): CHOL, HDL, LDLCALC, TRIG, CHOLHDL, LDLDIRECT in the last 72 hours. Thyroid function studies: No results for input(s): TSH, T4TOTAL, T3FREE, THYROIDAB in the last 72 hours.  Invalid input(s): FREET3 Anemia work up: No results for input(s): VITAMINB12, FOLATE, FERRITIN, TIBC, IRON, RETICCTPCT in the last 72 hours. Sepsis Labs:  Recent Labs Lab 03/19/16 0850  WBC 9.9   Urine analysis:    Component Value Date/Time   COLORURINE ORANGE* 03/21/2015 0903   APPEARANCEUR CLOUDY* 03/21/2015 0903   LABSPEC 1.028 03/21/2015 0903   PHURINE 5.0 03/21/2015 0903   GLUCOSEU NEGATIVE 03/21/2015 0903   HGBUR NEGATIVE 03/21/2015 0903   BILIRUBINUR SMALL* 03/21/2015 0903   KETONESUR 15* 03/21/2015 0903   PROTEINUR NEGATIVE 03/21/2015 0903   UROBILINOGEN 0.2 03/21/2015 0903   NITRITE NEGATIVE 03/21/2015 0903   LEUKOCYTESUR SMALL* 03/21/2015 0903   Microbiology No results found for this or any previous visit (from the past 240 hour(s)).  Radiology: Dg Chest 2 View  03/19/2016  CLINICAL DATA:  Right side chest pain, rib fractures EXAM: CHEST  2 VIEW COMPARISON:  02/22/2016 FINDINGS: Probable healing rib fractures on the right with associated pleural thickening. Lungs otherwise clear. Heart is normal size. No effusions or pneumothorax. IMPRESSION: Healing right rib fractures with pleural thickening. Findings similar to prior study. Electronically Signed   By: Charlett Nose M.D.   On: 03/19/2016 08:28    Medications:   . escitalopram  10 mg Oral Daily  . folic acid  1 mg Oral Daily  . lidocaine  1 patch Transdermal Q24H  . multivitamin with minerals  1 tablet Oral Daily  . [START ON 03/23/2016] pantoprazole  40 mg Intravenous Q12H  . sodium chloride flush  3 mL Intravenous Q12H  . temazepam  15 mg Oral QHS  . thiamine  100 mg Oral Daily   Or  . thiamine  100 mg Intravenous Daily   Continuous Infusions: . sodium chloride 100 mL/hr at 03/20/16 1431   . pantoprozole (PROTONIX) infusion 8 mg/hr (03/20/16 1432)    Time spent: 25 min   LOS: 1 day   Caoimhe Damron  [facility] [id number]  *Please refer to [email or web address], password [email or web address] to get updated schedule on who will round on this patient, as hospitalists switch teams weekly. If 7PM-7AM, please contact night-coverage at [email or web address], password [email or web address] for any overnight needs.  03/20/2016, 6:52 PM

## 2016-03-20 NOTE — Brief Op Note (Signed)
Proximal stomach ulcer with bleeding stigmata. Flat red spot but no visible vessel seen. No active bleeding. Continue Protonix drip until tomorrow morning and then if stable change to PO PPI BID (continue as an outpt for 3 months and then QD until further notice). Full liquid diet and advance as tolerated. If stable then ok to go home tomorrow and f/u with me in office in 4-6 weeks. Will arrange outpt colonoscopy. Needs to STOP ALL NSAIDs and stop drinking alcohol.

## 2016-03-21 ENCOUNTER — Encounter (HOSPITAL_COMMUNITY): Payer: Self-pay | Admitting: Gastroenterology

## 2016-03-21 LAB — HEMOGLOBIN AND HEMATOCRIT, BLOOD
HCT: 30.5 % — ABNORMAL LOW (ref 39.0–52.0)
HEMATOCRIT: 30.8 % — AB (ref 39.0–52.0)
Hemoglobin: 9.8 g/dL — ABNORMAL LOW (ref 13.0–17.0)
Hemoglobin: 10 g/dL — ABNORMAL LOW (ref 13.0–17.0)

## 2016-03-21 LAB — TROPONIN I: TROPONIN I: 0.04 ng/mL — AB (ref <0.03)

## 2016-03-21 MED ORDER — OXYCODONE-ACETAMINOPHEN 5-325 MG PO TABS: 1.0000 | ORAL_TABLET | Freq: Three times a day (TID) | ORAL | Status: DC | PRN

## 2016-03-21 MED ORDER — FOLIC ACID 1 MG PO TABS: 1.0000 mg | ORAL_TABLET | Freq: Every day | ORAL | Status: DC

## 2016-03-21 MED ORDER — PANTOPRAZOLE SODIUM 40 MG PO TBEC: 40.0000 mg | DELAYED_RELEASE_TABLET | Freq: Two times a day (BID) | ORAL | Status: DC

## 2016-03-21 NOTE — Progress Notes (Signed)
Progress Note    John [name]  ZOX:[id number] DOB: 26-Mar-1961  DOA: 03/19/2016 PCP: Margaree Mackintosh, MD    Brief Narrative:   John [name] is an 55 y.o. male with a history of HTN, osteoarthritis, and EtOH dependence who presented to the ED in [facility] for evaluation of hematemesis, melena, and dizziness.  Underwent EGD showing proximal gastric ulcer. Started on PPI.  Plan for discharge in am, when his symptoms improve.   Assessment/Plan:   Principal Problem:   GI bleed Active Problems:   Elevated troponin   Acute blood loss anemia   EtOH dependence (HCC)   Acute upper GI bleed  Anemia of blood loss from GI bleed: IV PPI, and serial hemoglobin checks.repeat hemoglobin is around 9 today.  Gi consulted and underwent EGD on 7/4 , showed proximal ulcer with bleeding stigmata.  Liquid diet started and advanced diet as tolerated.  Avoid NSAIDS'  Appreciate GI recommendations.  Plan for out patient colonoscopy in 55 yo 6 weeks.  Repeat hemoglobin tonight and transfuse to keep hemoglobin greater than 7.  This am patient reports worsening of nausea and dizziness.  Repeat hemoglobin ordered for this afternoon and orthostatic vital signs requested.  Would continue IV fluids.  Monitor for malena.   Elevated troponin unclear etiology.  Repeat troponin pending today. No chest pain.  Possibly Demand ischemia.  EKG repeat shows sinus with non specific t wave changes.    ETOH dependence: On CIWA.  Very anxious o n ativan.    RIB PAIN: Pain control with tylenol.  lidoderm patch.       Family Communication/Anticipated D/C date and plan/Code Status   DVT prophylaxis:scd's Code Status: Full Code.  Family Communication: none at bedside Disposition Plan: possible home in am when symptoms improve.   Medical Consultants:   Gi.  Procedures:  EGD.  Anti-Infectives:   Anti-infectives    None      Subjective:    John [name] reports nauseated, no  vomiting , dizziness on walking.  Denies any chest pain or sob.   Objective:    Filed Vitals:   03/20/16 1424 03/20/16 1831 03/21/16 0008 03/21/16 0610  BP: 156/93 152/89 123/76 122/74  Pulse: 92 86 85 80  Temp:  98.3 F (36.8 C) 98.1 F (36.7 C) 98.2 F (36.8 C)  TempSrc:      Resp: 18 18 18 18   Weight:      SpO2: 99% 100% 97% 100%    Intake/Output Summary (Last 24 hours) at 03/21/16 1155 Last data filed at 03/21/16 0845  Gross per 24 hour  Intake   2690 ml  Output      0 ml  Net   2690 ml   Filed Weights   03/19/16 0738  Weight: 97.523 kg (215 lb)    Exam: General exam: AppearsVERY ANXIOUS.  Respiratory system: Clear to auscultation. Respiratory effort normal. Cardiovascular system: S1 & S2 heard, RRR. No JVD,  rubs, gallops or clicks. No murmurs. Gastrointestinal system: Abdomen is nondistended, soft and nontender. No organomegaly or masses felt. Normal bowel sounds heard. Central nervous system: Alert and oriented. No focal neurological deficits. Extremities: No clubbing, edema, or cyanosis. Skin: No rashes, lesions or ulcers Psychiatry: anxious. .   Data Reviewed:   I have personally reviewed following labs and imaging studies:  Labs: Basic Metabolic Panel:  Recent Labs Lab 03/19/16 0850 03/20/16 0057  NA 135 137  K 3.9 4.2  CL 107 108  CO2 22 25  GLUCOSE 112* 105*  BUN 34* 16  CREATININE 0.68 0.84  CALCIUM 8.9 8.7*   GFR Estimated Creatinine Clearance: 119.7 mL/min (by C-G formula based on Cr of 0.84). Liver Function Tests:  Recent Labs Lab 03/19/16 0850  AST 20  ALT 19  ALKPHOS 91  BILITOT 1.0  PROT 6.3*  ALBUMIN 3.6    Recent Labs Lab 03/19/16 0850  LIPASE 15   No results for input(s): AMMONIA in the last 168 hours. Coagulation profile  Recent Labs Lab 03/19/16 1325  INR 1.08    CBC:  Recent Labs Lab 03/19/16 0850 03/19/16 1305 03/19/16 1847 03/20/16 0057 03/20/16 1904 03/21/16 0615  WBC 9.9  --   --   --   --    --   NEUTROABS 7.1  --   --   --   --   --   HGB 12.2* 11.4* 10.7* 10.1* 9.5* 9.8*  HCT 36.0* 34.6* 33.3* 30.9* 29.8* 30.5*  MCV 100.3*  --   --   --   --   --   PLT 210  --   --   --   --   --    Cardiac Enzymes:  Recent Labs Lab 03/19/16 0825 03/19/16 1306 03/19/16 1847 03/20/16 0057  TROPONINI 0.05* 0.04* 0.05* 0.05*   BNP (last 3 results) No results for input(s): PROBNP in the last 8760 hours. CBG: No results for input(s): GLUCAP in the last 168 hours. D-Dimer: No results for input(s): DDIMER in the last 72 hours. Hgb A1c: No results for input(s): HGBA1C in the last 72 hours. Lipid Profile: No results for input(s): CHOL, HDL, LDLCALC, TRIG, CHOLHDL, LDLDIRECT in the last 72 hours. Thyroid function studies: No results for input(s): TSH, T4TOTAL, T3FREE, THYROIDAB in the last 72 hours.  Invalid input(s): FREET3 Anemia work up: No results for input(s): VITAMINB12, FOLATE, FERRITIN, TIBC, IRON, RETICCTPCT in the last 72 hours. Sepsis Labs:  Recent Labs Lab 03/19/16 0850  WBC 9.9   Urine analysis:    Component Value Date/Time   COLORURINE ORANGE* 03/21/2015 0903   APPEARANCEUR CLOUDY* 03/21/2015 0903   LABSPEC 1.028 03/21/2015 0903   PHURINE 5.0 03/21/2015 0903   GLUCOSEU NEGATIVE 03/21/2015 0903   HGBUR NEGATIVE 03/21/2015 0903   BILIRUBINUR SMALL* 03/21/2015 0903   KETONESUR 15* 03/21/2015 0903   PROTEINUR NEGATIVE 03/21/2015 0903   UROBILINOGEN 0.2 03/21/2015 0903   NITRITE NEGATIVE 03/21/2015 0903   LEUKOCYTESUR SMALL* 03/21/2015 0903   Microbiology No results found for this or any previous visit (from the past 240 hour(s)).  Radiology: No results found.  Medications:   . escitalopram  10 mg Oral Daily  . folic acid  1 mg Oral Daily  . lidocaine  1 patch Transdermal Q24H  . multivitamin with minerals  1 tablet Oral Daily  . [START ON 03/23/2016] pantoprazole  40 mg Intravenous Q12H  . sodium chloride flush  3 mL Intravenous Q12H  . temazepam   15 mg Oral QHS  . thiamine  100 mg Oral Daily   Or  . thiamine  100 mg Intravenous Daily   Continuous Infusions: . sodium chloride 100 mL/hr at 03/20/16 2336  . pantoprozole (PROTONIX) infusion 8 mg/hr (03/21/16 0209)    Time spent: 25 min   LOS: 2 days   Ivy Meriwether  [facility] [id number]  *Please refer to [email or web address], password [email or web address] to get updated schedule on who will round on this patient, as hospitalists switch teams weekly. If 7PM-7AM, please contact night-coverage at [email or web address],  password [email or web address] for any overnight needs.  03/21/2016, 11:55 AM

## 2016-03-21 NOTE — Progress Notes (Signed)
NURSING PROGRESS NOTE  John [name] [id number] Discharge Data: 03/21/2016 7:39 PM Attending Provider: No att. providers found [id number], Jonny Ruiz, MD     [name] to be D/C'd Home per MD order.  Discussed with the patient the After Visit Summary and all questions fully answered. All IV's discontinued with no bleeding noted. All belongings returned to patient for patient to take home.   Last Vital Signs:  Blood pressure 144/78, pulse 90, temperature 99.3 F (37.4 C), temperature source Oral, resp. rate 16, weight 97.523 kg (215 lb), SpO2 99 %.  Discharge Medication List   Medication List    TAKE these medications        escitalopram 10 MG tablet  Commonly known as:  LEXAPRO  TAKE 1 TABLET (10 MG TOTAL) BY MOUTH DAILY.     folic acid 1 MG tablet  Commonly known as:  FOLVITE  Take 1 tablet (1 mg total) by mouth daily.     LORazepam 1 MG tablet  Commonly known as:  ATIVAN  Take 0.5-1 mg by mouth 3 (three) times daily as needed.     multivitamin with minerals tablet  Take 1 tablet by mouth daily.     oxyCODONE-acetaminophen 5-325 MG tablet  Commonly known as:  PERCOCET/ROXICET  Take 1 tablet by mouth every 8 (eight) hours as needed for severe pain.     pantoprazole 40 MG tablet  Commonly known as:  PROTONIX  Take 1 tablet (40 mg total) by mouth 2 (two) times daily.

## 2016-03-21 NOTE — Discharge Summary (Signed)
Physician Discharge Summary  John [name] HWE:[id number] DOB: 05/11/1961 DOA: 03/19/2016  PCP: Margaree Mackintosh, MD  Admit date: 03/19/2016 Discharge date: 03/21/2016  Admitted From: home.  Disposition:  Home   Recommendations for Outpatient Follow-up:  1. Follow up with PCP in 1-2 weeks 2. Please obtain BMP/CBC in one week 3. Please follow up on the biopsy results and follow up with GI for colonoscopy.     Discharge Condition: (Stable,) CODE STATUS: (FULL, ) Diet recommendation: Heart Healthy /  Brief/Interim Summary: John [name] is an 55 y.o. male with a history of HTN, osteoarthritis, and EtOH dependence who presented to the ED in [place] for evaluation of hematemesis, melena, and dizziness.  Underwent EGD showing proximal gastric ulcer. Started on PPI.   Discharge Diagnoses:  Principal Problem:   GI bleed Active Problems:   Elevated troponin   Acute blood loss anemia   EtOH dependence (HCC)   Acute upper GI bleed  Acute upper GI bleed  Anemia of blood loss from GI bleed: IV PPI, and serial hemoglobin checks.repeat hemoglobin is around 9 today.  Gi consulted and underwent EGD on 7/4 , showed proximal ulcer with bleeding stigmata.  Liquid diet started and advanced diet as tolerated.  Avoid NSAIDS'  Appreciate GI recommendations.  Plan for out patient colonoscopy in 55 yo 6 weeks.  Pt reported some nausea earlier, reported its gone, will discharge dhim on oral PPI as per GI recommendations.    Elevated troponin unclear etiology.  Repeat troponin trending down. No chest pain. EKG repeat shows sinus with non specific t wave changes.    ETOH dependence: On CIWA while inpatient. No signs of withdrawal.   RIB PAIN: Pain control with tylenol.  lidoderm patch.    Discharge Instructions  Discharge Instructions    Diet general    Complete by:  As directed      Discharge instructions    Complete by:  As directed   Please follow up with Dr  Bosie Clos in 4 to 6 weeks as recommended.  Please follow up with PCP in one week, and check cbc.            Medication List    TAKE these medications        escitalopram 10 MG tablet  Commonly known as:  LEXAPRO  TAKE 1 TABLET (10 MG TOTAL) BY MOUTH DAILY.     folic acid 1 MG tablet  Commonly known as:  FOLVITE  Take 1 tablet (1 mg total) by mouth daily.     LORazepam 1 MG tablet  Commonly known as:  ATIVAN  Take 0.5-1 mg by mouth 3 (three) times daily as needed.     multivitamin with minerals tablet  Take 1 tablet by mouth daily.     oxyCODONE-acetaminophen 5-325 MG tablet  Commonly known as:  PERCOCET/ROXICET  Take 1 tablet by mouth every 8 (eight) hours as needed for severe pain.     pantoprazole 40 MG tablet  Commonly known as:  PROTONIX  Take 1 tablet (40 mg total) by mouth 2 (two) times daily.           Follow-up Information    Follow up with Margaree Mackintosh, MD. Schedule an appointment as soon as possible for a visit in 1 week.   Specialty:  Internal Medicine   Contact information:   [address] [place] [address] [phone]       Follow up with Shirley Friar., MD. Schedule an appointment as soon  as possible for a visit in 4 weeks.   Specialty:  Gastroenterology   Contact information:   [address] [place] [address] [phone]      No Known Allergies  Consultations:  Gastroenterology.    Procedures/Studies: Dg Chest 2 View  03/19/2016  CLINICAL DATA:  Right side chest pain, rib fractures EXAM: CHEST  2 VIEW COMPARISON:  02/22/2016 FINDINGS: Probable healing rib fractures on the right with associated pleural thickening. Lungs otherwise clear. Heart is normal size. No effusions or pneumothorax. IMPRESSION: Healing right rib fractures with pleural thickening. Findings similar to prior study. Electronically Signed   By: Charlett Nose M.D.   On: 03/19/2016 08:28   Dg Chest 2 View  02/22/2016  CLINICAL  DATA:  Bilateral rib pain after fall 1 week ago. EXAM: CHEST  2 VIEW COMPARISON:  None. FINDINGS: The patient is status post right shoulder replacement. There is pleural thickening on the right. There is a subtle contour abnormality of the fourth lateral right rib which could represent sequela of a fracture, especially given the pleural thickening in this region. Dedicated rib films may better evaluate if clinically warranted. No pneumothorax. No pulmonary nodules or masses. No focal infiltrates. No other acute abnormalities identified. IMPRESSION: Pleural thickening on the right may be due to a subtle underlying right lateral rib fracture. Dedicated rib films could further evaluate. No other acute abnormalities. Electronically Signed   By: Gerome Sam III M.D   On: 02/22/2016 11:45   Dg Wrist Complete Left  02/22/2016  CLINICAL DATA:  Fall with left wrist pain and swelling. EXAM: LEFT WRIST - COMPLETE 3+ VIEW COMPARISON:  None. FINDINGS: There is a slightly displaced fracture of the triquetrum, with associated small avulsion fracture fragment seen posterior to the carpal bones on the lateral projection. Remainder the carpal bones appear intact and normally aligned. Distal radius and ulna appear intact and normally aligned. Visualized portions of the metacarpal bones appear intact and normally aligned. IMPRESSION: Slightly displaced fracture of the triquetrum, with associated small avulsion fracture fragment posteriorly. Electronically Signed   By: Bary Richard M.D.   On: 02/22/2016 12:19       Subjective: No complaints.   Discharge Exam: Filed Vitals:   03/21/16 0008 03/21/16 0610  BP: 123/76 122/74  Pulse: 85 80  Temp: 98.1 F (36.7 C) 98.2 F (36.8 C)  Resp: 18 18   Filed Vitals:   03/20/16 1424 03/20/16 1831 03/21/16 0008 03/21/16 0610  BP: 156/93 152/89 123/76 122/74  Pulse: 92 86 85 80  Temp:  98.3 F (36.8 C) 98.1 F (36.7 C) 98.2 F (36.8 C)  TempSrc:      Resp: Weight:      SpO2: 99% 100% 97% 100%    General: Pt is alert, awake, not in acute distress Cardiovascular: RRR, S1/S2 +, no rubs, no gallops Respiratory: CTA bilaterally, no wheezing, no rhonchi Abdominal: Soft, NT, ND, bowel sounds + Extremities: no edema, no cyanosis    The results of significant diagnostics from this hospitalization (including imaging, microbiology, ancillary and laboratory) are listed below for reference.     Microbiology: No results found for this or any previous visit (from the past 240 hour(s)).   Labs: BNP (last 3 results) No results for input(s): BNP in the last 8760 hours. Basic Metabolic Panel:  Recent Labs Lab 03/19/16 0850 03/20/16 0057  NA 135 137  K 3.9 4.2  CL 107 108  CO2  22 25  GLUCOSE 112* 105*  BUN 34* 16  CREATININE 0.68 0.84  CALCIUM 8.9 8.7*   Liver Function Tests:  Recent Labs Lab 03/19/16 0850  AST 20  ALT 19  ALKPHOS 91  BILITOT 1.0  PROT 6.3*  ALBUMIN 3.6    Recent Labs Lab 03/19/16 0850  LIPASE 15   No results for input(s): AMMONIA in the last 168 hours. CBC:  Recent Labs Lab 03/19/16 0850 03/19/16 1305 03/19/16 1847 03/20/16 0057 03/20/16 1904 03/21/16 0615  WBC 9.9  --   --   --   --   --   NEUTROABS 7.1  --   --   --   --   --   HGB 12.2* 11.4* 10.7* 10.1* 9.5* 9.8*  HCT 36.0* 34.6* 33.3* 30.9* 29.8* 30.5*  MCV 100.3*  --   --   --   --   --   PLT 210  --   --   --   --   --    Cardiac Enzymes:  Recent Labs Lab 03/19/16 0825 03/19/16 1306 03/19/16 1847 03/20/16 0057  TROPONINI 0.05* 0.04* 0.05* 0.05*   BNP: Invalid input(s): POCBNP CBG: No results for input(s): GLUCAP in the last 168 hours. D-Dimer No results for input(s): DDIMER in the last 72 hours. Hgb A1c No results for input(s): HGBA1C in the last 72 hours. Lipid Profile No results for input(s): CHOL, HDL, LDLCALC, TRIG, CHOLHDL, LDLDIRECT in the last 72 hours. Thyroid function studies No results for input(s): TSH,  T4TOTAL, T3FREE, THYROIDAB in the last 72 hours.  Invalid input(s): FREET3 Anemia work up No results for input(s): VITAMINB12, FOLATE, FERRITIN, TIBC, IRON, RETICCTPCT in the last 72 hours. Urinalysis    Component Value Date/Time   COLORURINE ORANGE* 03/21/2015 0903   APPEARANCEUR CLOUDY* 03/21/2015 0903   LABSPEC 1.028 03/21/2015 0903   PHURINE 5.0 03/21/2015 0903   GLUCOSEU NEGATIVE 03/21/2015 0903   HGBUR NEGATIVE 03/21/2015 0903   BILIRUBINUR SMALL* 03/21/2015 0903   KETONESUR 15* 03/21/2015 0903   PROTEINUR NEGATIVE 03/21/2015 0903   UROBILINOGEN 0.2 03/21/2015 0903   NITRITE NEGATIVE 03/21/2015 0903   LEUKOCYTESUR SMALL* 03/21/2015 0903   Sepsis Labs Invalid input(s): PROCALCITONIN,  WBC,  LACTICIDVEN Microbiology No results found for this or any previous visit (from the past 240 hour(s)).   Time coordinating discharge: Over 30 minutes  SIGNED:   Kathlen Mody, MD  [facility] 03/21/2016, 10:17 AM Pager [phone]  If 7PM-7AM, please contact night-coverage [email or web address] Password [email or web address]

## 2016-03-21 NOTE — Care Management Note (Signed)
Case Management Note  Patient Details  Name: John [name] MRN: [id number] Date of Birth: Oct 10, 1960  Subjective/Objective:                    Action/Plan:   Plan is discharge to home today.  Expected Discharge Date:      03/22/2015             Expected Discharge Plan:  Homeselfcare  In-House Referral:     Discharge planning Services  CM Consult   Status of Service:  Completed, signed off  If discussed at Long Length of Stay Meetings, dates discussed:    Redge Gainer [phone] 03/21/2016, 11:42 AM

## 2016-10-17 ENCOUNTER — Inpatient Hospital Stay (HOSPITAL_COMMUNITY)
Admission: EM | Admit: 2016-10-17 | Discharge: 2016-10-22 | DRG: 286 | Disposition: A | Discharge status: Home / Self Care | Payer: Self-pay | Attending: Internal Medicine | Admitting: Internal Medicine

## 2016-10-17 ENCOUNTER — Encounter (HOSPITAL_COMMUNITY): Payer: Self-pay | Admitting: Emergency Medicine

## 2016-10-17 ENCOUNTER — Inpatient Hospital Stay (HOSPITAL_COMMUNITY): Payer: Self-pay

## 2016-10-17 ENCOUNTER — Emergency Department (HOSPITAL_COMMUNITY): Payer: Self-pay

## 2016-10-17 DIAGNOSIS — I5041 Acute combined systolic (congestive) and diastolic (congestive) heart failure: Principal | ICD-10-CM | POA: Diagnosis present

## 2016-10-17 DIAGNOSIS — I5042 Chronic combined systolic (congestive) and diastolic (congestive) heart failure: Secondary | ICD-10-CM

## 2016-10-17 DIAGNOSIS — I428 Other cardiomyopathies: Secondary | ICD-10-CM

## 2016-10-17 DIAGNOSIS — I110 Hypertensive heart disease with heart failure: Secondary | ICD-10-CM

## 2016-10-17 DIAGNOSIS — I5040 Unspecified combined systolic (congestive) and diastolic (congestive) heart failure: Secondary | ICD-10-CM

## 2016-10-17 DIAGNOSIS — E871 Hypo-osmolality and hyponatremia: Secondary | ICD-10-CM | POA: Diagnosis present

## 2016-10-17 DIAGNOSIS — F419 Anxiety disorder, unspecified: Secondary | ICD-10-CM | POA: Diagnosis present

## 2016-10-17 DIAGNOSIS — E669 Obesity, unspecified: Secondary | ICD-10-CM | POA: Diagnosis present

## 2016-10-17 DIAGNOSIS — N179 Acute kidney failure, unspecified: Secondary | ICD-10-CM | POA: Diagnosis present

## 2016-10-17 DIAGNOSIS — Z96642 Presence of left artificial hip joint: Secondary | ICD-10-CM | POA: Diagnosis present

## 2016-10-17 DIAGNOSIS — I272 Pulmonary hypertension, unspecified: Secondary | ICD-10-CM | POA: Diagnosis present

## 2016-10-17 DIAGNOSIS — I160 Hypertensive urgency: Secondary | ICD-10-CM | POA: Diagnosis present

## 2016-10-17 DIAGNOSIS — G4730 Sleep apnea, unspecified: Secondary | ICD-10-CM | POA: Diagnosis present

## 2016-10-17 DIAGNOSIS — R600 Localized edema: Secondary | ICD-10-CM | POA: Insufficient documentation

## 2016-10-17 DIAGNOSIS — F17290 Nicotine dependence, other tobacco product, uncomplicated: Secondary | ICD-10-CM | POA: Diagnosis present

## 2016-10-17 DIAGNOSIS — J9600 Acute respiratory failure, unspecified whether with hypoxia or hypercapnia: Secondary | ICD-10-CM | POA: Diagnosis present

## 2016-10-17 DIAGNOSIS — Z96611 Presence of right artificial shoulder joint: Secondary | ICD-10-CM | POA: Diagnosis present

## 2016-10-17 DIAGNOSIS — I509 Heart failure, unspecified: Secondary | ICD-10-CM

## 2016-10-17 DIAGNOSIS — Z8249 Family history of ischemic heart disease and other diseases of the circulatory system: Secondary | ICD-10-CM

## 2016-10-17 DIAGNOSIS — G8929 Other chronic pain: Secondary | ICD-10-CM | POA: Diagnosis present

## 2016-10-17 DIAGNOSIS — Z23 Encounter for immunization: Secondary | ICD-10-CM

## 2016-10-17 DIAGNOSIS — F329 Major depressive disorder, single episode, unspecified: Secondary | ICD-10-CM | POA: Diagnosis present

## 2016-10-17 DIAGNOSIS — I5021 Acute systolic (congestive) heart failure: Secondary | ICD-10-CM | POA: Diagnosis present

## 2016-10-17 DIAGNOSIS — I429 Cardiomyopathy, unspecified: Secondary | ICD-10-CM | POA: Diagnosis present

## 2016-10-17 DIAGNOSIS — I472 Ventricular tachycardia: Secondary | ICD-10-CM | POA: Diagnosis not present

## 2016-10-17 DIAGNOSIS — Z79899 Other long term (current) drug therapy: Secondary | ICD-10-CM

## 2016-10-17 DIAGNOSIS — Z6832 Body mass index (BMI) 32.0-32.9, adult: Secondary | ICD-10-CM

## 2016-10-17 HISTORY — DX: Chronic combined systolic (congestive) and diastolic (congestive) heart failure: I50.42

## 2016-10-17 HISTORY — DX: Hypertensive heart disease with heart failure: I11.0

## 2016-10-17 HISTORY — DX: Other cardiomyopathies: I42.8

## 2016-10-17 HISTORY — PX: TRANSTHORACIC ECHOCARDIOGRAM: SHX275

## 2016-10-17 HISTORY — DX: Unspecified combined systolic (congestive) and diastolic (congestive) heart failure: I50.40

## 2016-10-17 LAB — BASIC METABOLIC PANEL
ANION GAP: 11 (ref 5–15)
BUN: 17 mg/dL (ref 6–20)
CO2: 20 mmol/L — ABNORMAL LOW (ref 22–32)
Calcium: 9.1 mg/dL (ref 8.9–10.3)
Chloride: 101 mmol/L (ref 101–111)
Creatinine, Ser: 1.27 mg/dL — ABNORMAL HIGH (ref 0.61–1.24)
Glucose, Bld: 101 mg/dL — ABNORMAL HIGH (ref 65–99)
POTASSIUM: 4.1 mmol/L (ref 3.5–5.1)
SODIUM: 132 mmol/L — AB (ref 135–145)

## 2016-10-17 LAB — CBC
HEMATOCRIT: 46.6 % (ref 39.0–52.0)
HEMOGLOBIN: 14.9 g/dL (ref 13.0–17.0)
MCH: 32.5 pg (ref 26.0–34.0)
MCHC: 32 g/dL (ref 30.0–36.0)
MCV: 101.7 fL — ABNORMAL HIGH (ref 78.0–100.0)
Platelets: 220 10*3/uL (ref 150–400)
RBC: 4.58 MIL/uL (ref 4.22–5.81)
RDW: 14.2 % (ref 11.5–15.5)
WBC: 10.5 10*3/uL (ref 4.0–10.5)

## 2016-10-17 LAB — I-STAT TROPONIN, ED: Troponin i, poc: 0.07 ng/mL (ref 0.00–0.08)

## 2016-10-17 MED ORDER — NITROGLYCERIN 0.4 MG SL SUBL
0.4000 mg | SUBLINGUAL_TABLET | Freq: Once | SUBLINGUAL | Status: AC
  Administered 2016-10-17: 0.4 mg via SUBLINGUAL
  Filled 2016-10-17: qty 1

## 2016-10-17 MED ORDER — FUROSEMIDE 10 MG/ML IJ SOLN
40.0000 mg | INTRAMUSCULAR | Status: AC
  Administered 2016-10-17: 40 mg via INTRAVENOUS
  Filled 2016-10-17: qty 4

## 2016-10-17 MED ORDER — NITROGLYCERIN 0.4 MG SL SUBL
0.4000 mg | SUBLINGUAL_TABLET | SUBLINGUAL | Status: DC | PRN
Start: 2016-10-17 — End: 2016-10-17

## 2016-10-17 NOTE — ED Notes (Signed)
Patient transported to X-ray 

## 2016-10-17 NOTE — ED Provider Notes (Signed)
[facility]-EMERGENCY DEPT Provider Note   CSN: [id number] Arrival date & time: 10/17/16  1957    History   Chief Complaint Chief Complaint  Patient presents with  . Chest Pain  . Shortness of Breath    HPI John [name] is a 56 y.o. male.  56 year old male presents to the emergency department for evaluation of progressive shortness of breath and dyspnea on exertion. Symptoms associated with lower extremity edema. Symptoms have been progressive over the past few weeks, but worsening over the last 7 days. He states that he is winded when walking only short distances, such as hi car to the front door of [facility]. Patient reports a 20 pound weight gain over the past 3 weeks. He has also been increasingly fatigued. No history of congestive heart failure. He is not on any antihypertensive medications. The patient saw his primary care doctor today for evaluation who prompted him to seek treatment in the emergency department after his BNP resulted at almost 2600. He was given a Rx for 20mg  Lasix today and took 1 tablet PTA. The patient denies any recent fevers or upper respiratory symptoms. He has no chest pain at this time.    The history is provided by the patient. No language interpreter was used.  Chest Pain   Associated symptoms include shortness of breath.  Shortness of Breath  Associated symptoms include chest pain.    Past Medical History:  Diagnosis Date  . Anxiety    Ativan  . Chronic left hip pain   . Depression    history of  . GI bleed 03/2016  . Headache   . History of hypertension   . Osteoarthritis    right shoulder  . Right hand fracture   . Seasonal allergies   . Sleep apnea    wears a CPAP  . Wears glasses     Patient Active Problem List   Diagnosis Date Noted  . Acute CHF (congestive heart failure) (HCC) 10/17/2016  . GI bleed 03/19/2016  . Elevated troponin 03/19/2016  . Acute blood loss anemia 03/19/2016  . EtOH dependence (HCC) 03/19/2016  . Acute upper GI  bleed 03/19/2016  . S/P shoulder replacement 11/24/2015  . Elevated transaminase level 03/27/2015  . Degenerative joint disease of left hip 03/27/2015    Past Surgical History:  Procedure Laterality Date  . ESOPHAGOGASTRODUODENOSCOPY (EGD) WITH PROPOFOL N/A 03/20/2016   Procedure: ESOPHAGOGASTRODUODENOSCOPY (EGD) WITH PROPOFOL;  Surgeon: Charlott Rakes, MD;  Location: [facility] ENDOSCOPY;  Service: Endoscopy;  Laterality: N/A;  . TOTAL HIP ARTHROPLASTY Left 03/27/2015   Procedure: LEFT TOTAL HIP ARTHROPLASTY ANTERIOR APPROACH;  Surgeon: Samson Frederic, MD;  Location: [facility] OR;  Service: Orthopedics;  Laterality: Left;  . TOTAL SHOULDER ARTHROPLASTY Right 11/24/2015   Procedure: RIGHT TOTAL SHOULDER ARTHROPLASTY;  Surgeon: Beverely Low, MD;  Location[facility] OR;  Service: Orthopedics;  Laterality: Right;  . WISDOM TOOTH EXTRACTION         Home Medications    Prior to Admission medications   Medication Sig Start Date End Date Taking? Authorizing Provider  escitalopram (LEXAPRO) 10 MG tablet TAKE 1 TABLET (10 MG TOTAL) BY MOUTH DAILY. 10/31/15  Yes Historical Provider, MD  LORazepam (ATIVAN) 1 MG tablet Take 0.5-1 mg by mouth 3 (three) times daily as needed for anxiety or sleep.  03/10/15  Yes Historical Provider, MD  Multiple Vitamins-Minerals (MULTIVITAMIN WITH MINERALS) tablet Take 1 tablet by mouth daily.   Yes Historical Provider, MD  oxyCODONE-acetaminophen (PERCOCET/ROXICET) 5-325 MG tablet Take 1 tablet  by mouth every 8 (eight) hours as needed for severe pain. 03/21/16  Yes Kathlen Mody, MD    Family History No family history on file.  Social History Social History  Substance Use Topics  . Smoking status: Never Smoker  . Smokeless tobacco: Never Used  . Alcohol use 1.8 oz/week    3 Cans of beer per week     Comment: occasional     Allergies   Patient has no known allergies.   Review of Systems Review of Systems  Respiratory: Positive for shortness of breath.   Cardiovascular:  Positive for chest pain.  Ten systems reviewed and are negative for acute change, except as noted in the HPI.    Physical Exam Updated Vital Signs BP 122/91   Pulse 102   Temp 97.1 F (36.2 C) (Oral)   Resp 23   Ht 5\' 11"  (1.803 m)   Wt 113.9 kg   SpO2 95%   BMI 35.02 kg/m   Physical Exam  Constitutional: He is oriented to person, place, and time. He appears well-developed and well-nourished. No distress.  Nontoxic appearing and in NAD. Pleasant.  HENT:  Head: Normocephalic and atraumatic.  Eyes: Conjunctivae and EOM are normal. No scleral icterus.  Neck: Normal range of motion.  No significant JVD appreciated.  Cardiovascular: Regular rhythm and intact distal pulses.   Borderline tachycardia.  Pulmonary/Chest: Effort normal. No respiratory distress. He has no wheezes.  No tachypnea or dyspnea. Mildly decreased lung sounds in bilateral bases. No wheezes, rales, or rhonchi. Chest expansion symmetric.  Musculoskeletal: Normal range of motion. He exhibits edema.  3+ BLE edema, pitting.  Neurological: He is alert and oriented to person, place, and time. He exhibits normal muscle tone. Coordination normal.  Skin: Skin is warm and dry. No rash noted. He is not diaphoretic. No erythema. No pallor.  Psychiatric: He has a normal mood and affect. His behavior is normal.  Nursing note and vitals reviewed.    ED Treatments / Results  Labs (all labs ordered are listed, but only abnormal results are displayed) Labs Reviewed  BASIC METABOLIC PANEL - Abnormal; Notable for the following:       Result Value   Sodium 132 (*)    CO2 20 (*)    Glucose, Bld 101 (*)    Creatinine, Ser 1.27 (*)    All other components within normal limits  CBC - Abnormal; Notable for the following:    MCV 101.7 (*)    All other components within normal limits  I-STAT TROPOININ, ED  BNP, per Care Everywhere - 2,523.9 (H)   EKG  EKG Interpretation  Date/Time:  Thursday October 17 2016 20:03:26  EST Ventricular Rate:  104 PR Interval:  136 QRS Duration: 92 QT Interval:  348 QTC Calculation: 457 R Axis:   19 Text Interpretation:  Sinus tachycardia with occasional Premature ventricular complexes Nonspecific T wave abnormality No significant change since last tracing Confirmed by Anitra Lauth  MD, Alphonzo Lemmings ([id number]) on 10/17/2016 10:04:41 PM       Radiology  CLINICAL DATA:Initial evaluation for acute shortness of breath, edema.  EXAM: CHEST2 VIEW COMPARISON:None available.  FINDINGS: Mild cardiomegaly.Mediastinal silhouette within normal limits.  Lungs normally inflated. Mild perihilar vascular congestion without overt pulmonary edema. Small layering right pleural effusion. Patchy and linear right basilar opacity favored to reflect atelectasis, although infiltrate could be considered in the correct clinical setting. No other focal airspace disease. No pneumothorax.  No acute osseus abnormality.Right shoulder arthroplasty noted.  IMPRESSION:  1. Cardiomegaly with mild perihilar vascular congestion without overt pulmonary edema. 2. Small right pleural effusion. Associated right basilar opacity favored to reflect atelectasis, although infiltrate could be considered in the correct clinical setting.  Electronically Signed By: BenjaminMcClintock M.D. On: 10/17/2016 16:51   Procedures Procedures (including critical care time)  Medications Ordered in ED Medications  furosemide (LASIX) injection 40 mg (40 mg Intravenous Given 10/17/16 2247)  nitroGLYCERIN (NITROSTAT) SL tablet 0.4 mg (0.4 mg Sublingual Given 10/17/16 2247)     Initial Impression / Assessment and Plan / ED Course  I have reviewed the triage vital signs and the nursing notes.  Pertinent labs & imaging results that were available during my care of the patient were reviewed by me and considered in my medical decision making (see chart for details).     56 year old male presents to the emergency  department for progressive shortness of breath and dyspnea on exertion. Symptoms clinically consistent with acute onset of CHF. Patient with no history of this or hypertension. He will require diuresis and echocardiogram in the morning. The patient has been seen in the emergency department by cardiology who recommended hospitalist admission. Dr. Maryfrances Bunnell aware of patient and will admit.   Final Clinical Impressions(s) / ED Diagnoses   Final diagnoses:  Acute on chronic congestive heart failure, unspecified congestive heart failure type [address] [facility])    New Prescriptions New Prescriptions   No medications on file     Antony Madura, PA-C 10/17/16 2328    Gwyneth Sprout, MD 10/18/16 2319

## 2016-10-17 NOTE — ED Notes (Signed)
Notified RN Reita Cliche results from [facility] troponin

## 2016-10-17 NOTE — ED Triage Notes (Addendum)
Pt complains of left sided chest pain starting today, other symptoms include: SOB (upon walking) , weakness, lack of appetite.  Pt reports 25lb in one week, went to his MD who called him and told him to go to the ED if any other pain, "chest is full of fluid."  No hx of cardiac hx.

## 2016-10-18 ENCOUNTER — Inpatient Hospital Stay (HOSPITAL_COMMUNITY): Payer: PRIVATE HEALTH INSURANCE

## 2016-10-18 ENCOUNTER — Encounter (HOSPITAL_COMMUNITY): Payer: Self-pay | Admitting: Family Medicine

## 2016-10-18 DIAGNOSIS — I509 Heart failure, unspecified: Secondary | ICD-10-CM

## 2016-10-18 DIAGNOSIS — I472 Ventricular tachycardia: Secondary | ICD-10-CM

## 2016-10-18 DIAGNOSIS — I10 Essential (primary) hypertension: Secondary | ICD-10-CM

## 2016-10-18 DIAGNOSIS — E871 Hypo-osmolality and hyponatremia: Secondary | ICD-10-CM

## 2016-10-18 DIAGNOSIS — N179 Acute kidney failure, unspecified: Secondary | ICD-10-CM

## 2016-10-18 LAB — TROPONIN I
TROPONIN I: 0.1 ng/mL — AB (ref <0.03)
TROPONIN I: 0.12 ng/mL — AB (ref <0.03)
Troponin I: 0.1 ng/mL (ref <0.03)

## 2016-10-18 LAB — MAGNESIUM: MAGNESIUM: 2.1 mg/dL (ref 1.7–2.4)

## 2016-10-18 LAB — BASIC METABOLIC PANEL
Anion gap: 12 (ref 5–15)
BUN: 18 mg/dL (ref 6–20)
CALCIUM: 9.1 mg/dL (ref 8.9–10.3)
CO2: 25 mmol/L (ref 22–32)
Chloride: 102 mmol/L (ref 101–111)
Creatinine, Ser: 1.14 mg/dL (ref 0.61–1.24)
GFR calc Af Amer: >60 mL/min (ref >60)
Glucose, Bld: 115 mg/dL — ABNORMAL HIGH (ref 65–99)
POTASSIUM: 3.5 mmol/L (ref 3.5–5.1)
SODIUM: 139 mmol/L (ref 135–145)

## 2016-10-18 LAB — ECHOCARDIOGRAM COMPLETE
Height: 71 in
Weight: 3796.8 oz

## 2016-10-18 LAB — TSH: TSH: 3.501 u[IU]/mL (ref 0.350–4.500)

## 2016-10-18 MED ORDER — PERFLUTREN LIPID MICROSPHERE
1.0000 mL | INTRAVENOUS | Status: AC | PRN
  Administered 2016-10-18: 2 mL via INTRAVENOUS
  Filled 2016-10-18: qty 10

## 2016-10-18 MED ORDER — LORAZEPAM 1 MG PO TABS
0.5000 mg | ORAL_TABLET | Freq: Every evening | ORAL | Status: DC | PRN
  Administered 2016-10-18 – 2016-10-21 (×4): 1 mg via ORAL
  Filled 2016-10-18 (×5): qty 1

## 2016-10-18 MED ORDER — CARVEDILOL 3.125 MG PO TABS
3.1250 mg | ORAL_TABLET | Freq: Two times a day (BID) | ORAL | Status: DC
Start: 2016-10-18 — End: 2016-10-19
  Administered 2016-10-18 – 2016-10-19 (×2): 3.125 mg via ORAL
  Filled 2016-10-18 (×2): qty 1

## 2016-10-18 MED ORDER — POTASSIUM CHLORIDE CRYS ER 20 MEQ PO TBCR
20.0000 meq | EXTENDED_RELEASE_TABLET | Freq: Two times a day (BID) | ORAL | Status: DC
  Administered 2016-10-18 – 2016-10-22 (×10): 20 meq via ORAL
  Filled 2016-10-18 (×10): qty 1

## 2016-10-18 MED ORDER — ACETAMINOPHEN 650 MG RE SUPP
650.0000 mg | Freq: Four times a day (QID) | RECTAL | Status: DC | PRN
Start: 2016-10-18 — End: 2016-10-22

## 2016-10-18 MED ORDER — FUROSEMIDE 10 MG/ML IJ SOLN
40.0000 mg | Freq: Two times a day (BID) | INTRAMUSCULAR | Status: DC
  Administered 2016-10-18 – 2016-10-19 (×4): 40 mg via INTRAVENOUS
  Filled 2016-10-18 (×4): qty 4

## 2016-10-18 MED ORDER — INFLUENZA VAC SPLIT QUAD 0.5 ML IM SUSY
0.5000 mL | PREFILLED_SYRINGE | INTRAMUSCULAR | Status: AC
  Administered 2016-10-19: 0.5 mL via INTRAMUSCULAR
  Filled 2016-10-18: qty 0.5

## 2016-10-18 MED ORDER — LORAZEPAM 2 MG/ML IJ SOLN
1.0000 mg | Freq: Once | INTRAMUSCULAR | Status: AC
Start: 2016-10-18 — End: 2016-10-18
  Administered 2016-10-18: 1 mg via INTRAVENOUS
  Filled 2016-10-18: qty 1

## 2016-10-18 MED ORDER — ESCITALOPRAM OXALATE 10 MG PO TABS
10.0000 mg | ORAL_TABLET | Freq: Every day | ORAL | Status: DC
  Administered 2016-10-18 – 2016-10-22 (×5): 10 mg via ORAL
  Filled 2016-10-18 (×5): qty 1

## 2016-10-18 MED ORDER — PNEUMOCOCCAL VAC POLYVALENT 25 MCG/0.5ML IJ INJ
0.5000 mL | INJECTION | INTRAMUSCULAR | Status: AC
  Administered 2016-10-19: 0.5 mL via INTRAMUSCULAR
  Filled 2016-10-18: qty 0.5

## 2016-10-18 MED ORDER — ENOXAPARIN SODIUM 40 MG/0.4ML ~~LOC~~ SOLN
40.0000 mg | SUBCUTANEOUS | Status: DC
  Administered 2016-10-18 – 2016-10-21 (×4): 40 mg via SUBCUTANEOUS
  Filled 2016-10-18 (×4): qty 0.4

## 2016-10-18 MED ORDER — ACETAMINOPHEN 325 MG PO TABS: 650.0000 mg | ORAL_TABLET | Freq: Four times a day (QID) | ORAL | Status: DC | PRN

## 2016-10-18 MED ORDER — LISINOPRIL 5 MG PO TABS
5.0000 mg | ORAL_TABLET | Freq: Every day | ORAL | Status: DC
  Administered 2016-10-18 – 2016-10-22 (×5): 5 mg via ORAL
  Filled 2016-10-18 (×5): qty 1

## 2016-10-18 NOTE — Progress Notes (Signed)
  Echocardiogram 2D Echocardiogram with Definity has been performed.  John [name] 10/18/2016, 10:30 AM

## 2016-10-18 NOTE — Progress Notes (Signed)
Nutrition Brief Note/Education Note  Patient identified on the Malnutrition Screening Tool (MST) Report  Wt Readings from Last 15 Encounters:  10/18/16 237 lb 4.8 oz (107.6 kg)  03/19/16 215 lb (97.5 kg)  02/22/16 215 lb (97.5 kg)  11/24/15 237 lb (107.5 kg)  11/14/15 237 lb 11.2 oz (107.8 kg)  03/27/15 241 lb 1.6 oz (109.4 kg)  03/21/15 241 lb 1.6 oz (109.4 kg)    Body mass index is 33.1 kg/m. Patient meets criteria for Obesity based on current BMI. Pt states that he has gained weight recently due to eating out often at bars, drinking more beer, and also due to swelling. He states that his appetite is good. He requested nutrition education regarding heart healthy eating.   RD provided "Low Sodium Nutrition Therapy" handout from the Academy of [employer]. Reviewed patient's dietary recall. Provided examples on ways to decrease sodium intake in diet. Discouraged intake of processed foods and use of salt shaker. Encouraged fresh fruits and vegetables as well as whole grain sources of carbohydrates to maximize fiber intake. Pt demonstrates knowledge on heart healthy eating, understanding the benefits of fruits, vegetables and whole grains. RD provided "Heart Healthy Eating Nutrition Therapy" handout from the Academy of [employer]. Recommended whole grains, beans, nuts, fruits, vegetables, low fat dairy, and fresh lean meats.   RD discussed why it is important for patient to adhere to diet recommendations. Teach back method used.  Expect very good compliance. Pt expresses that he is very motivated to make lifestyle changes. He went to [employer] school many years ago and feels confident in his ability to make changes.   No further nutrition interventions warranted at this time. RD contact information provided. If additional nutrition issues arise, please re-consult RD.   Dorothea Ogle RD, CSP, LDN Inpatient Clinical Dietitian Pager: [phone] After Hours Pager:  [phone]

## 2016-10-18 NOTE — H&P (Signed)
History and Physical  Patient Name: John [name]     BJY:[id number]    DOB: August 08, 1961    DOA: 10/17/2016 PCP: Margaree Mackintosh, MD   Patient coming from: Home --> PCP's office  Chief Complaint: Dyspnea, leg swelling  HPI: John [name] is a 56 y.o. male with a past medical history significant for anxiety/depression who presents with few weeks worsening SOB with exertion and now leg swelling, weight gain, pulmonary edema on CXR and elevated BNP.  The patient was in his usual staet of health until about 2-3 weeks ago when he developed a cold/sinusitis symptoms and started taking "every OTC sinus medicine I could get, nothing worked".  Shortly after that he started to have short of breath with exertion that persisted for a few weeks.  Now in the last week he has had SOB with minimal exertion on flat ground, then fatigue all the time, swelling in his legs and 20 lbs weight gain.  He had no orthopnea, PND or chest pain or palpitations.  He went to his PCP today who ordered a CXR that showed perihilar congestion and a BNP that was >2000 and so he was sent to the ER.  ED course: -Afebrile, heart rate 98, respirations and pulse ox normal, BP 143/103 -Na 132, K 4.1, Cr 1.27 (baseline 0.8), WBC 10.5K, Hgb 14.9 -Troponin negative -ECG showed sinus tachycardia without ST changes -CXR showed patchy perihilar opacities, consistent with edema -He was given furosemide 40 mg IV and TRH were asked to evaluate    He works in sales/logistics, takes clients out to eat often, and since breaking up with his GF who moved out, he eats out most meals.  He also has had heavier drinking in the past, now endorses "occasional beers" but is vague to quantify.    He has some mild depression from his relationship and also from his deceased child many years ago.      ROS: Review of Systems  HENT: Positive for congestion.   Respiratory: Positive for shortness of breath.   Cardiovascular: Positive for leg  swelling. Negative for chest pain, palpitations, orthopnea and PND.  All other systems reviewed and are negative.         Past Medical History:  Diagnosis Date  . Anxiety    Ativan  . Chronic left hip pain   . Depression    history of  . GI bleed 03/2016  . Headache   . History of hypertension   . Osteoarthritis    right shoulder  . Right hand fracture   . Seasonal allergies   . Sleep apnea    wears a CPAP  . Wears glasses     Past Surgical History:  Procedure Laterality Date  . ESOPHAGOGASTRODUODENOSCOPY (EGD) WITH PROPOFOL N/A 03/20/2016   Procedure: ESOPHAGOGASTRODUODENOSCOPY (EGD) WITH PROPOFOL;  Surgeon: Charlott Rakes, MD;  Location: [facility] ENDOSCOPY;  Service: Endoscopy;  Laterality: N/A;  . TOTAL HIP ARTHROPLASTY Left 03/27/2015   Procedure: LEFT TOTAL HIP ARTHROPLASTY ANTERIOR APPROACH;  Surgeon: Samson Frederic, MD;  Location: [facility] OR;  Service: Orthopedics;  Laterality: Left;  . TOTAL SHOULDER ARTHROPLASTY Right 11/24/2015   Procedure: RIGHT TOTAL SHOULDER ARTHROPLASTY;  Surgeon: Beverely Low, MD;  Location: [facility] OR;  Service: Orthopedics;  Laterality: Right;  . WISDOM TOOTH EXTRACTION      Social History: Patient lives alone.  The patient walks unassisted.  He is divorced, has two older children, a 45 year old and a child who passed away.  He smokes cigars.  Endorses "occasional beer" now, denies withdrawal history.    From [place] originally.  No Known Allergies  Family history: family history includes Congenital heart disease in his sister.  Prior to Admission medications   Medication Sig Start Date End Date Taking? Authorizing Provider  escitalopram (LEXAPRO) 10 MG tablet TAKE 1 TABLET (10 MG TOTAL) BY MOUTH DAILY. 10/31/15  Yes Historical Provider, MD  LORazepam (ATIVAN) 1 MG tablet Take 0.5-1 mg by mouth 3 (three) times daily as needed for anxiety or sleep.  03/10/15  Yes Historical Provider, MD  Multiple Vitamins-Minerals (MULTIVITAMIN WITH MINERALS) tablet Take 1  tablet by mouth daily.   Yes Historical Provider, MD  oxyCODONE-acetaminophen (PERCOCET/ROXICET) 5-325 MG tablet Take 1 tablet by mouth every 8 (eight) hours as needed for severe pain. 03/21/16  Yes Kathlen Mody, MD       Physical Exam: BP (!) 153/107   Pulse 105   Temp 97.1 F (36.2 C) (Oral)   Resp (!) 27   Ht 5\' 11"  (1.803 m)   Wt 113.9 kg (251 lb 1.6 oz)   SpO2 100%   BMI 35.02 kg/m  General appearance: Well-developed, obese adult male, alert and in no acute distress.   Eyes: Anicteric, conjunctiva pink, lids and lashes normal. PERRL.    ENT: No nasal deformity, discharge, epistaxis.  Hearing normal. OP moist without lesions.   Neck: No neck masses.  Trachea midline.  No thyromegaly/tenderness. Lymph: No cervical or supraclavicular lymphadenopathy. Skin: Warm and dry.  No jaundice.  No suspicious rashes or lesions. Cardiac: Tachycardic, nl S1-S2, no murmurs appreciated.  Capillary refill is brisk.  JVP not visible.  2+ LE edema.  Radial and DP pulses 2+ and symmetric. Respiratory: Normal respiratory rate and rhythm.  CTAB without rales or wheezes. Abdomen: Abdomen soft.  No TTP. No ascites, distension, hepatosplenomegaly.   MSK: No deformities or effusions.  No cyanosis or clubbing. Neuro: Cranial nerves normal.  Sensation intact to light touch. Speech is fluent.  Muscle strength normal.    Psych: Sensorium intact and responding to questions, attention normal.  Behavior appropriate.  Affect normal.  Judgment and insight appear normal.     Labs on Admission:  I have personally reviewed following labs and imaging studies: CBC:  Recent Labs Lab 10/17/16 2013  WBC 10.5  HGB 14.9  HCT 46.6  MCV 101.7*  PLT 220   Basic Metabolic Panel:  Recent Labs Lab 10/17/16 2013  NA 132*  K 4.1  CL 101  CO2 20*  GLUCOSE 101*  BUN 17  CREATININE 1.27*  CALCIUM 9.1   GFR: Estimated Creatinine Clearance: 84.3 mL/min (by C-G formula based on SCr of 1.27 mg/dL (H)).  Liver  Function Tests: No results for input(s): AST, ALT, ALKPHOS, BILITOT, PROT, ALBUMIN in the last 168 hours. No results for input(s): LIPASE, AMYLASE in the last 168 hours. No results for input(s): AMMONIA in the last 168 hours. Coagulation Profile: No results for input(s): INR, PROTIME in the last 168 hours. Cardiac Enzymes: No results for input(s): CKTOTAL, CKMB, CKMBINDEX, TROPONINI in the last 168 hours. BNP (last 3 results) No results for input(s): PROBNP in the last 8760 hours. HbA1C: No results for input(s): HGBA1C in the last 72 hours. CBG: No results for input(s): GLUCAP in the last 168 hours. Lipid Profile: No results for input(s): CHOL, HDL, LDLCALC, TRIG, CHOLHDL, LDLDIRECT in the last 72 hours. Thyroid Function Tests: No results for input(s): TSH, T4TOTAL, FREET4, T3FREE, THYROIDAB in the last 72 hours. Anemia  Panel: No results for input(s): VITAMINB12, FOLATE, FERRITIN, TIBC, IRON, RETICCTPCT in the last 72 hours. Sepsis Labs: Invalid input(s): PROCALCITONIN, LACTICIDVEN No results found for this or any previous visit (from the past 240 hour(s)).       Radiological Exams on Admission: Personally reviewed CXR shows perihilar opacities: Dg Chest 2 View  Result Date: 10/18/2016 CLINICAL DATA:  Subacute onset of shortness of breath and dyspnea on exertion. Lower extremity edema. Initial encounter. EXAM: CHEST  2 VIEW COMPARISON:  Chest radiograph performed earlier today at 4:13 p.m. FINDINGS: The lungs are well-aerated. Mild vascular congestion is noted. Persistent right basilar opacity may reflect mild pneumonia. There is no evidence of pleural effusion or pneumothorax. The heart is normal in size; the mediastinal contour is within normal limits. No acute osseous abnormalities are seen. Chronic right-sided rib deformities are noted. The patient's right shoulder arthroplasty is incompletely imaged on this study. IMPRESSION: Mild vascular congestion noted. Persistent right basilar  opacity raises concern for mild pneumonia. Electronically Signed   By: Roanna Raider M.D.   On: 10/18/2016 00:14    EKG: Independently reviewed. Rate 104, QTc 457, diffuse TW changes, no ST depressions.    Assessment/Plan  1. Acute CHF, new onset:  Unknown CHF type.  He reports high sodium intake.  Denies significant alcohol use.  No previous ischemic heart disease.  No HTN, DM history.   -Furosemide 40 mg IV BID -Potass suppl -Strict I/O, daily weights -Daily BMP while on diuresis -Obtain echocardiogram  -Check TSH -Start lisinopril 5 mg daily   2. Acute kidney injury versus renal congestion:  Suspect latter -Daily BMP  3. Hyponatremia:  Mild. -Diurese and trend BMP  4. Other medications: -Continue lorazepam and escitalopram for anxiety/mood     DVT prophylaxis: Lovenox  Code Status: FULL  Family Communication: None present  Disposition Plan: Anticipate IV diuresis and Echocardiogram.  Anticipate 3-4 days hospitalization. Consults called: Cardiology Admission status: INPATIENT, tele        Medical decision making: Patient seen at 1:05 AM on 10/18/2016.  The patient was discussed with Antony Madura, PA-C.  What exists of the patient's chart was reviewed in depth and summarized above.  Clinical condition: stable.        Alberteen Sam [facility] Pager [phone]      At the time of admission, it appears that the appropriate admission status for this patient is INPATIENT. This is judged to be reasonable and necessary in order to provide the required intensity of service to ensure the patient's safety given the presenting symptoms, physical exam findings, and initial radiographic and laboratory data in the context of their chronic comorbidities.  Together, these circumstances are felt to place him at high risk for further clinical deterioration threatening life, limb, or organ.   Patient requires inpatient status due to high intensity of service,  high risk for further deterioration and high frequency of surveillance required because of this severe exacerbation of organ failure.    He has new onset CHF with unknown EF, no previous history of same.  Shortness of breath with exertion, respiratory rate 27 on presentation, heart rate that increases to 140 per minute with minimal exertion and sympatic dyspnea, BNP > 2000 and edema on CXR.  I certify that at the point of admission it is my clinical judgment that the patient will require inpatient hospital care spanning beyond 2 midnights from the point of admission and that early discharge would result in unnecessary risk of decompensation and  readmission or threat to life, limb or bodily function.

## 2016-10-18 NOTE — Progress Notes (Signed)
Pt had 5 beats  Of Vtach. Pt asymptomatic. [facility] made aware.

## 2016-10-18 NOTE — Care Management Note (Signed)
Case Management Note  Patient Details  Name: Alisha Durr MRN: [id number] Date of Birth: 12/16/1960  Subjective/Objective:       Admitted with CHF             Action/Plan: Independent prior to admission; PCP: Margaree Mackintosh, MD; has private insurance with [employer] with prescription drug coverage; CM following for DCP  Expected Discharge Date:  Possibly 10/21/2016             Expected Discharge Plan:  Home/Self Care  Discharge planning Services  CM Consult   Status of Service:  In process, will continue to follow  Reola Mosher [phone] 10/18/2016, 1:58 PM

## 2016-10-18 NOTE — Progress Notes (Signed)
CRITICAL VALUE ALERT  Critical value received: Trop 0.10  Date of notification:  10/18/16  Time of notification:  1237pm  Critical value read back: yes  Nurse who received alert: [id number]  MD notified (1st page):  L.Ingold,NP  Time of first page: 1238  MD notified (2nd page):  Time of second page:  Responding MD: L.Annie Paras NP  Time MD responded:  1239

## 2016-10-18 NOTE — Progress Notes (Signed)
Pt seen and examined at bedside, admitted after midnight, please see earlier admission note by Dr. Maryfrances Bunnell. Pt admitted for evaluation of acute onset CHF. Cardiology consulted, ECHO pending. CBC and BMP in AM.   Debbora Presto, MD  [facility] Pager [phone]  If 7PM-7AM, please contact night-coverage [email or web address] Password [email or web address]

## 2016-10-18 NOTE — Consult Note (Signed)
Reason for Consult: CHF   Referring Physician: Dr. Loleta Books   PCP:  Dyann Ruddle, MD  Primary Cardiologist:NEW  John [name] is an 56 y.o. male.    Chief Complaint: admitted 10/18/16 at 0100 with SOB and edema   HPI:  39 yom with no prior  Cardiac hx presented with increasing SOB more with exertion and wt gain, leg edema. He saw PCP yesterday and CXR  Per chart with pulmonary edema and elevated BNP.  He was sent to ER.  In ER EKG SR with PVC and no changes from July 2017 Troponin POC 0.07, Cr elevated at 1.27, Na 132, K+ 4.1;  HGB stable at 14.9, WBC 10.5 TSH 3.501 BP in ER 143/103 now improved to 125/96  Treated with IV lasix now neg 1910.   Wt down from 251 to 237 lbs.  5 beats of NSVT  Then 21 sec of WCT  Somewhat irregular   Tele: SR with PACs and PVCs, one 5 beat run of V tach.  Pt tells me for about 2 weeks he has had cold symptoms, using pseudoephedrine.  The cold would come and go.  He has been gaining wt. Since breakup with girlfriend about a year ago.  He started eating out more and increased ETOH, but he recently slowed this down and was trying to eat better.  His PCP whe is a social friend as well, noticed to edema in legs and had him come to  The office.  CXR with pul. Edema and he was sent to [facility].   Pt has not had chest pain.  He has had DOE, and not really at night though he has been propped up due to sinus congestion.  No fevers.  + rapid and irregular HR at times.  No cardiac hx.  No hx of CVA.  BP has been up some over previous months but recently has been back to normal.   Past Medical History:  Diagnosis Date  . Anxiety    Ativan  . Chronic left hip pain   . Depression    history of  . GI bleed 03/2016  . Headache   . History of hypertension   . Osteoarthritis    right shoulder  . Right hand fracture   . Seasonal allergies   . Sleep apnea    wears a CPAP  . Wears glasses     Past Surgical History:  Procedure Laterality Date  .  ESOPHAGOGASTRODUODENOSCOPY (EGD) WITH PROPOFOL N/A 03/20/2016   Procedure: ESOPHAGOGASTRODUODENOSCOPY (EGD) WITH PROPOFOL;  Surgeon: Wilford Corner, MD;  Location[facility] ENDOSCOPY;  Service: Endoscopy;  Laterality: N/A;  . TOTAL HIP ARTHROPLASTY Left 03/27/2015   Procedure: LEFT TOTAL HIP ARTHROPLASTY ANTERIOR APPROACH;  Surgeon: Rod Can, MD;  Location: [place];  Service: Orthopedics;  Laterality: Left;  . TOTAL SHOULDER ARTHROPLASTY Right 11/24/2015   Procedure: RIGHT TOTAL SHOULDER ARTHROPLASTY;  Surgeon: Netta Cedars, MD;  Location: [place];  Service: Orthopedics;  Laterality: Right;  . WISDOM TOOTH EXTRACTION      Family History  Problem Relation Age of Onset  . Congenital heart disease Sister    Social History:  reports that he has never smoked. He has never used smokeless tobacco. He reports that he drinks about 1.8 oz of alcohol per week . He reports that he does not use drugs.  Allergies: No Known Allergies  OUTPATIENT MEDICATIONS: No current facility-administered medications on file prior to encounter.    Current Outpatient  Prescriptions on File Prior to Encounter  Medication Sig Dispense Refill  . escitalopram (LEXAPRO) 10 MG tablet TAKE 1 TABLET (10 MG TOTAL) BY MOUTH DAILY.  2  . LORazepam (ATIVAN) 1 MG tablet Take 0.5-1 mg by mouth 3 (three) times daily as needed for anxiety or sleep.   0  . Multiple Vitamins-Minerals (MULTIVITAMIN WITH MINERALS) tablet Take 1 tablet by mouth daily.     CURRENT MEDICATIONS: Scheduled Meds: . enoxaparin (LOVENOX) injection  40 mg Subcutaneous Q24H  . escitalopram  10 mg Oral Daily  . furosemide  40 mg Intravenous BID  . [START ON 10/19/2016] Influenza vac split quadrivalent PF  0.5 mL Intramuscular Tomorrow-1000  . lisinopril  5 mg Oral Daily  . [START ON 10/19/2016] pneumococcal 23 valent vaccine  0.5 mL Intramuscular Tomorrow-1000  . potassium chloride  20 mEq Oral BID   Continuous Infusions: PRN Meds:.acetaminophen **OR** acetaminophen,  LORazepam   Results for orders placed or performed during the hospital encounter of 10/17/16 (from the past 48 hour(s))  Basic metabolic panel     Status: Abnormal   Collection Time: 10/17/16  8:13 PM  Result Value Ref Range   Sodium 132 (L) 135 - 145 mmol/L   Potassium 4.1 3.5 - 5.1 mmol/L   Chloride 101 101 - 111 mmol/L   CO2 20 (L) 22 - 32 mmol/L   Glucose, Bld 101 (H) 65 - 99 mg/dL   BUN 17 6 - 20 mg/dL   Creatinine, Ser 1.27 (H) 0.61 - 1.24 mg/dL   Calcium 9.1 8.9 - 10.3 mg/dL   GFR calc non Af Amer >60 >60 mL/min   GFR calc Af Amer >60 >60 mL/min    Comment: (NOTE) The eGFR has been calculated using the CKD EPI equation. This calculation has not been validated in all clinical situations. eGFR's persistently <60 mL/min signify possible Chronic Kidney Disease.    Anion gap 11 5 - 15  CBC     Status: Abnormal   Collection Time: 10/17/16  8:13 PM  Result Value Ref Range   WBC 10.5 4.0 - 10.5 K/uL   RBC 4.58 4.22 - 5.81 MIL/uL   Hemoglobin 14.9 13.0 - 17.0 g/dL   HCT 46.6 39.0 - 52.0 %   MCV 101.7 (H) 78.0 - 100.0 fL   MCH 32.5 26.0 - 34.0 pg   MCHC 32.0 30.0 - 36.0 g/dL   RDW 14.2 11.5 - 15.5 %   Platelets 220 150 - 400 K/uL  TSH     Status: None   Collection Time: 10/17/16  8:13 PM  Result Value Ref Range   TSH 3.501 0.350 - 4.500 uIU/mL    Comment: Performed by a 3rd Generation assay with a functional sensitivity of <=0.01 uIU/mL.  I-stat troponin, ED     Status: None   Collection Time: 10/17/16  8:26 PM  Result Value Ref Range   Troponin i, poc 0.07 0.00 - 0.08 ng/mL   Comment 3            Comment: Due to the release kinetics of cTnI, a negative result within the first hours of the onset of symptoms does not rule out myocardial infarction with certainty. If myocardial infarction is still suspected, repeat the test at appropriate intervals.    Dg Chest 2 View  Result Date: 10/18/2016 CLINICAL DATA:  Subacute onset of shortness of breath and dyspnea on  exertion. Lower extremity edema. Initial encounter. EXAM: CHEST  2 VIEW COMPARISON:  Chest radiograph performed earlier  today at 4:13 p.m. FINDINGS: The lungs are well-aerated. Mild vascular congestion is noted. Persistent right basilar opacity may reflect mild pneumonia. There is no evidence of pleural effusion or pneumothorax. The heart is normal in size; the mediastinal contour is within normal limits. No acute osseous abnormalities are seen. Chronic right-sided rib deformities are noted. The patient's right shoulder arthroplasty is incompletely imaged on this study. IMPRESSION: Mild vascular congestion noted. Persistent right basilar opacity raises concern for mild pneumonia. Electronically Signed   By: Garald Balding M.D.   On: 10/18/2016 00:14    ROS: General:+ colds no fevers, + weight increase Skin:no rashes or ulcers HEENT:no blurred vision, no congestion CV:see HPI PUL:see HPI GI:no diarrhea constipation or melena, no indigestion GU:no hematuria, no dysuria MS:no joint pain, no claudication Neuro:no syncope, no lightheadedness Endo:no diabetes, no thyroid disease  Blood pressure (!) 125/96, pulse 99, temperature 97.5 F (36.4 C), temperature source Oral, resp. rate 18, height '5\' 11"'$  (1.803 m), weight 237 lb 4.8 oz (107.6 kg), SpO2 98 %.  Wt Readings from Last 3 Encounters:  10/18/16 237 lb 4.8 oz (107.6 kg)  03/19/16 215 lb (97.5 kg)  02/22/16 215 lb (97.5 kg)    PE: General:Pleasant affect, NAD Skin:Warm and dry, brisk capillary refill HEENT:normocephalic, sclera clear, mucus membranes moist Neck:supple, no JVD, no bruits  Heart:S1S2 RRR without murmur, gallup, rub or click Lungs:clear without rales, rhonchi, or wheezes GYK:ZLDJT,TSVX, non tender, + BS, do not palpate liver spleen or masses Ext:1+ lower ext edema, 2+ pedal pulses, 2+ radial pulses Neuro:alert and oriented X 3, MAE, follows commands, + facial symmetry    Assessment/Plan Principal Problem:   Acute CHF  (congestive heart failure) (HCC) Active Problems:   Hyponatremia   AKI (acute kidney injury) ([place])   1. Acute CHF most likely due to combined systolic/diastolic HF with recent illness and HTN.  Echo pending- I asked them to make ASAP.  --lasix 40 IV BID and diuresing --lisinopril has been added once HF improved add BB --rechecking troponin  2. Hypertensive urgency BP now improved. On ACE   3. AKI on admit monitor renal with ACE  BMP pending; normal BUS would suggest that this is not prerenal  4. NSVT and WCT check BMP with Mag this AM  --echo pending  Cecilie Kicks  Nurse Practitioner Certified [place] Pager [phone] or after 5pm or weekends call [phone] 10/18/2016, 8:07 AM    I have seen, examined and evaluated the patient this AM along with Cecilie Kicks, NP.  After reviewing all the available data and chart, we discussed the patients laboratory, study & physical findings as well as symptoms in detail. I agree with her findings, examination as well as impression recommendations as per our discussion.    Mr. Sar is a 56 year old gentleman with probably hypertension and some anxiety issues who has developed obesity due to dietary indiscretion. He now presents with about 2 weeks of progressively worsening dyspnea and weight gain/edema. He was sent in by his PCP for about a 20 pound weight gain. Apparently something he saw on blood work made him request the patient come directly to the hospital. In the ER he was given diuretic as brisk urine output  but unfortunately it was not recorded.   His EKG in of itself is not overly worrisome, however his white count was tachycardia which in my view does appear to be more consistent with DVT. He had has had some aberrantly conducted PACs however and it  could be aberrantly conducted PAT her PAF however very short burst would be unlikely.  We are still waiting on his echocardiogram to know for sure whether this is acute  heart failure or not and whether there is a systolic component recently diastolic.  I do think with the abnormal rhythms and presentation of heart failure that he will probably need ischemic evaluation, but this will be dependent on the echocardiogram - the echo was relatively stable, I think we can probably perceive with Myoview stress test tomorrow, however if he does have a cardiomyopathy, I would prefer to planned cardiac catheterization early next week.   I do agree with starting a beta blocker because and making sure that his magnesium and potassium levels are stable. He is already on an ACE inhibitor which would be beneficial for both diastolic and/or systolic heart failure.  He still has significant edema but the lungs are clear. For now I would continue IV Lasix twice a day for today and then reassess in the morning.   More to follow pending results of echocardiogram   [name], M.D., M.S. Interventional Cardiologist   Pager # [phone] Phone # [phone] [address] [place], [place] [address]

## 2016-10-19 DIAGNOSIS — I5021 Acute systolic (congestive) heart failure: Secondary | ICD-10-CM

## 2016-10-19 LAB — BASIC METABOLIC PANEL
Anion gap: 11 (ref 5–15)
BUN: 14 mg/dL (ref 6–20)
CALCIUM: 8.8 mg/dL — AB (ref 8.9–10.3)
CO2: 27 mmol/L (ref 22–32)
CREATININE: 0.99 mg/dL (ref 0.61–1.24)
Chloride: 101 mmol/L (ref 101–111)
GFR calc non Af Amer: >60 mL/min (ref >60)
GLUCOSE: 117 mg/dL — AB (ref 65–99)
Potassium: 3.7 mmol/L (ref 3.5–5.1)
Sodium: 139 mmol/L (ref 135–145)

## 2016-10-19 LAB — CBC
HCT: 44.9 % (ref 39.0–52.0)
Hemoglobin: 14.6 g/dL (ref 13.0–17.0)
MCH: 33.1 pg (ref 26.0–34.0)
MCHC: 32.5 g/dL (ref 30.0–36.0)
MCV: 101.8 fL — AB (ref 78.0–100.0)
PLATELETS: 204 10*3/uL (ref 150–400)
RBC: 4.41 MIL/uL (ref 4.22–5.81)
RDW: 14.3 % (ref 11.5–15.5)
WBC: 9.2 10*3/uL (ref 4.0–10.5)

## 2016-10-19 LAB — TROPONIN I: Troponin I: 0.09 ng/mL (ref <0.03)

## 2016-10-19 LAB — MAGNESIUM: Magnesium: 2 mg/dL (ref 1.7–2.4)

## 2016-10-19 MED ORDER — FUROSEMIDE 40 MG PO TABS
40.0000 mg | ORAL_TABLET | Freq: Every day | ORAL | Status: DC
  Administered 2016-10-20 – 2016-10-21 (×2): 40 mg via ORAL
  Filled 2016-10-19 (×2): qty 1

## 2016-10-19 MED ORDER — CARVEDILOL 6.25 MG PO TABS
6.2500 mg | ORAL_TABLET | Freq: Two times a day (BID) | ORAL | Status: DC
  Administered 2016-10-19 – 2016-10-22 (×5): 6.25 mg via ORAL
  Filled 2016-10-19 (×6): qty 1

## 2016-10-19 MED ORDER — LORAZEPAM 1 MG PO TABS
1.0000 mg | ORAL_TABLET | Freq: Two times a day (BID) | ORAL | Status: DC | PRN
  Administered 2016-10-19: 1 mg via ORAL

## 2016-10-19 NOTE — Progress Notes (Signed)
Dr Toniann Fail made aware of Nonsustained SVTS and increased troponin. ekg done Bmet and mg drawn sat as ordered.pt asymptomatic

## 2016-10-19 NOTE — Progress Notes (Signed)
Pt has a 6 beats of V-tach but pt stable, might be pt was moving a lot that time, vitals stable, MD aware,will continue to monitor the patient.

## 2016-10-19 NOTE — Progress Notes (Signed)
Pt is alert and oriented stated that he is a little restless no distress vital stable no pain.

## 2016-10-19 NOTE — Progress Notes (Signed)
Pt has episodes of mu;tiple PVC's and non sustained SVT's. Pt asymptomatic.observed pt closely

## 2016-10-19 NOTE — Progress Notes (Signed)
Patient ID: John [name], male   DOB: 21-Jul-1961, 56 y.o.   MRN: [id number]    PROGRESS NOTE    Gid Schoffstall  WUJ:[id number] DOB: 15-Aug-1961 DOA: 10/17/2016  PCP: Margaree Mackintosh, MD   Brief Narrative:  56 y.o. male with a past medical history significant for anxiety/depression who presented with few weeks worsening SOB with exertion and progressive leg swelling, weight gain, pulmonary edema on CXR and elevated BNP.  Assessment & Plan:   Principal Problem:   Acute respiratory failure due to Acute systolic congestive heart failure, NYHA class 4 (HCC) - EF 25% on ECHO, needs LHC on Monday - continue Lasix - weight trend since admission:  Filed Weights   10/17/16 2009 10/18/16 0356 10/19/16 0603  Weight: 113.9 kg (251 lb 1.6 oz) 107.6 kg (237 lb 4.8 oz) 104.8 kg (231 lb)  - monitor daily weights, strict I/O  Active Problems:   Hyponatremia - in the setting of volume overload - resolved - BMP in AM    AKI (acute kidney injury) (HCC) - in the setting of acute systolic CHF - resolved, close monitoring while in IV Lasix     Obesity  - Body mass index is 32.22 kg/m.  DVT prophylaxis: Lovenox SQ Code Status: Full  Family Communication: Patient at bedside  Disposition Plan: Home pending cath results   Consultants:   Cardiology   Procedures:   None   Antimicrobials:   None   Subjective: Reports feeling better.   Objective: Vitals:   10/18/16 2039 10/19/16 0054 10/19/16 0603 10/19/16 1406  BP: (!) 121/91 127/82 (!) 140/98 123/76  Pulse: 86 79 85 91  Resp: 18 20 18 20   Temp: 98.3 F (36.8 C) 98 F (36.7 C) 98.4 F (36.9 C) 98 F (36.7 C)  TempSrc: Oral Oral Oral Oral  SpO2: 97% 95%  96%  Weight:   104.8 kg (231 lb)   Height:        Intake/Output Summary (Last 24 hours) at 10/19/16 1446 Last data filed at 10/19/16 1322  Gross per 24 hour  Intake              840 ml  Output             2700 ml  Net            -1860 ml   Filed Weights   10/17/16  2009 10/18/16 0356 10/19/16 0603  Weight: 113.9 kg (251 lb 1.6 oz) 107.6 kg (237 lb 4.8 oz) 104.8 kg (231 lb)    Examination:  General exam: Appears calm and comfortable  Respiratory system: Respiratory effort normal. Cardiovascular system: S1 & S2 heard, RRR. No JVD, murmurs, rubs, gallops or clicks. LE edema Gastrointestinal system: Abdomen is nondistended, soft and nontender. No organomegaly or masses felt. Normal bowel sounds heard. Central nervous system: Alert and oriented. No focal neurological deficits.    Data Reviewed: I have personally reviewed following labs and imaging studies  CBC:  Recent Labs Lab 10/17/16 2013 10/19/16 0207  WBC 10.5 9.2  HGB 14.9 14.6  HCT 46.6 44.9  MCV 101.7* 101.8*  PLT 220 204   Basic Metabolic Panel:  Recent Labs Lab 10/17/16 2013 10/18/16 1109 10/19/16 0207  NA 132* 139 139  K 4.1 3.5 3.7  CL 101 102 101  CO2 20* 25 27  GLUCOSE 101* 115* 117*  BUN 17 18 14   CREATININE 1.27* 1.14 0.99  CALCIUM 9.1 9.1 8.8*  MG  --  2.1 2.0  Cardiac Enzymes:  Recent Labs Lab 10/18/16 1109 10/18/16 1430 10/18/16 2035 10/19/16 0207  TROPONINI 0.10* 0.10* 0.12* 0.09*   Thyroid Function Tests:  Recent Labs  10/17/16 2013  TSH 3.501   Urine analysis:  Radiology Studies: Dg Chest 2 View  Result Date: 10/18/2016 CLINICAL DATA:  Subacute onset of shortness of breath and dyspnea on exertion. Lower extremity edema. Initial encounter. EXAM: CHEST  2 VIEW COMPARISON:  Chest radiograph performed earlier today at 4:13 p.m. FINDINGS: The lungs are well-aerated. Mild vascular congestion is noted. Persistent right basilar opacity may reflect mild pneumonia. There is no evidence of pleural effusion or pneumothorax. The heart is normal in size; the mediastinal contour is within normal limits. No acute osseous abnormalities are seen. Chronic right-sided rib deformities are noted. The patient's right shoulder arthroplasty is incompletely imaged on  this study. IMPRESSION: Mild vascular congestion noted. Persistent right basilar opacity raises concern for mild pneumonia. Electronically Signed   By: Roanna Raider M.D.   On: 10/18/2016 00:14      Scheduled Meds: . carvedilol  6.25 mg Oral BID WC  . enoxaparin (LOVENOX) injection  40 mg Subcutaneous Q24H  . escitalopram  10 mg Oral Daily  . [START ON 10/20/2016] furosemide  40 mg Oral Daily  . lisinopril  5 mg Oral Daily  . potassium chloride  20 mEq Oral BID   Continuous Infusions:   LOS: 2 days    Time spent: 20 minutes    Debbora Presto, MD [facility] Pager [phone]  If 7PM-7AM, please contact night-coverage [email or web address] Password [email or web address] 10/19/2016, 2:46 PM

## 2016-10-19 NOTE — Progress Notes (Signed)
DAILY PROGRESS NOTE  Subjective:  Events noted overnight - including NSVT and troponin elevation 0.10, 0.10, 0.12 and 0.09 -flat trend. Echo yesterday shows EF 20-25%, global hypokinesis with apical and inferoseptal akinesis, there is moderate pulmonary hypertension. Creatinine is improving - now 0.99 today. EKG today shows sinus rhythm with PVC's.  Diuresed 2.5L negative - weight 231 lbs today. He reports at least 3 beers/night etoh use.  Objective:  Temp:  [98 F (36.7 C)-98.4 F (36.9 C)] 98.4 F (36.9 C) (02/03 0603) Pulse Rate:  [79-86] 85 (02/03 0603) Resp:  [18-20] 18 (02/03 0603) BP: (121-140)/(82-98) 140/98 (02/03 0603) SpO2:  [95 %-97 %] 95 % (02/03 0054) Weight:  [231 lb (104.8 kg)] 231 lb (104.8 kg) (02/03 0603) Weight change: -20 lb 1.6 oz (-9.117 kg)  Intake/Output from previous day: 02/02 0701 - 02/03 0700 In: 1300 [P.O.:1300] Out: 1940 [Urine:1940]  Intake/Output from this shift: Total I/O In: 240 [P.O.:240] Out: 900 [Urine:900]  Medications: No current facility-administered medications on file prior to encounter.    Current Outpatient Prescriptions on File Prior to Encounter  Medication Sig Dispense Refill  . escitalopram (LEXAPRO) 10 MG tablet TAKE 1 TABLET (10 MG TOTAL) BY MOUTH DAILY.  2  . LORazepam (ATIVAN) 1 MG tablet Take 0.5-1 mg by mouth 3 (three) times daily as needed for anxiety or sleep.   0  . Multiple Vitamins-Minerals (MULTIVITAMIN WITH MINERALS) tablet Take 1 tablet by mouth daily.      Physical Exam: General appearance: alert and no distress Neck: no carotid bruit and no JVD Lungs: clear to auscultation bilaterally Heart: regular rate and rhythm Abdomen: soft, non-tender; bowel sounds normal; no masses,  no organomegaly Extremities: extremities normal, atraumatic, no cyanosis or edema Pulses: 2+ and symmetric Skin: Skin color, texture, turgor normal. No rashes or lesions Neurologic: Grossly normal Psych: Pleasant, jovial  Lab  Results: Results for orders placed or performed during the hospital encounter of 10/17/16 (from the past 48 hour(s))  Basic metabolic panel     Status: Abnormal   Collection Time: 10/17/16  8:13 PM  Result Value Ref Range   Sodium 132 (L) 135 - 145 mmol/L   Potassium 4.1 3.5 - 5.1 mmol/L   Chloride 101 101 - 111 mmol/L   CO2 20 (L) 22 - 32 mmol/L   Glucose, Bld 101 (H) 65 - 99 mg/dL   BUN 17 6 - 20 mg/dL   Creatinine, Ser 1.27 (H) 0.61 - 1.24 mg/dL   Calcium 9.1 8.9 - 10.3 mg/dL   GFR calc non Af Amer >60 >60 mL/min   GFR calc Af Amer >60 >60 mL/min    Comment: (NOTE) The eGFR has been calculated using the CKD EPI equation. This calculation has not been validated in all clinical situations. eGFR's persistently <60 mL/min signify possible Chronic Kidney Disease.    Anion gap 11 5 - 15  CBC     Status: Abnormal   Collection Time: 10/17/16  8:13 PM  Result Value Ref Range   WBC 10.5 4.0 - 10.5 K/uL   RBC 4.58 4.22 - 5.81 MIL/uL   Hemoglobin 14.9 13.0 - 17.0 g/dL   HCT 46.6 39.0 - 52.0 %   MCV 101.7 (H) 78.0 - 100.0 fL   MCH 32.5 26.0 - 34.0 pg   MCHC 32.0 30.0 - 36.0 g/dL   RDW 14.2 11.5 - 15.5 %   Platelets 220 150 - 400 K/uL  TSH     Status: None   Collection  Time: 10/17/16  8:13 PM  Result Value Ref Range   TSH 3.501 0.350 - 4.500 uIU/mL    Comment: Performed by a 3rd Generation assay with a functional sensitivity of <=0.01 uIU/mL.  I-stat troponin, ED     Status: None   Collection Time: 10/17/16  8:26 PM  Result Value Ref Range   Troponin i, poc 0.07 0.00 - 0.08 ng/mL   Comment 3            Comment: Due to the release kinetics of cTnI, a negative result within the first hours of the onset of symptoms does not rule out myocardial infarction with certainty. If myocardial infarction is still suspected, repeat the test at appropriate intervals.   Magnesium     Status: None   Collection Time: 10/18/16 11:09 AM  Result Value Ref Range   Magnesium 2.1 1.7 - 2.4 mg/dL    Basic metabolic panel     Status: Abnormal   Collection Time: 10/18/16 11:09 AM  Result Value Ref Range   Sodium 139 135 - 145 mmol/L   Potassium 3.5 3.5 - 5.1 mmol/L   Chloride 102 101 - 111 mmol/L   CO2 25 22 - 32 mmol/L   Glucose, Bld 115 (H) 65 - 99 mg/dL   BUN 18 6 - 20 mg/dL   Creatinine, Ser 1.14 0.61 - 1.24 mg/dL   Calcium 9.1 8.9 - 10.3 mg/dL   GFR calc non Af Amer >60 >60 mL/min   GFR calc Af Amer >60 >60 mL/min    Comment: (NOTE) The eGFR has been calculated using the CKD EPI equation. This calculation has not been validated in all clinical situations. eGFR's persistently <60 mL/min signify possible Chronic Kidney Disease.    Anion gap 12 5 - 15  Troponin I     Status: Abnormal   Collection Time: 10/18/16 11:09 AM  Result Value Ref Range   Troponin I 0.10 (HH) <0.03 ng/mL    Comment: CRITICAL RESULT CALLED TO, READ BACK BY AND VERIFIED WITH: G.SASON,RN 10/18/16 @ 1236 BY N.LIVINGSTON   Troponin I     Status: Abnormal   Collection Time: 10/18/16  2:30 PM  Result Value Ref Range   Troponin I 0.10 (HH) <0.03 ng/mL    Comment: CRITICAL VALUE NOTED.  VALUE IS CONSISTENT WITH PREVIOUSLY REPORTED AND CALLED VALUE.  Troponin I     Status: Abnormal   Collection Time: 10/18/16  8:35 PM  Result Value Ref Range   Troponin I 0.12 (HH) <0.03 ng/mL    Comment: CRITICAL VALUE NOTED.  VALUE IS CONSISTENT WITH PREVIOUSLY REPORTED AND CALLED VALUE.  Basic metabolic panel     Status: Abnormal   Collection Time: 10/19/16  2:07 AM  Result Value Ref Range   Sodium 139 135 - 145 mmol/L   Potassium 3.7 3.5 - 5.1 mmol/L   Chloride 101 101 - 111 mmol/L   CO2 27 22 - 32 mmol/L   Glucose, Bld 117 (H) 65 - 99 mg/dL   BUN 14 6 - 20 mg/dL   Creatinine, Ser 0.99 0.61 - 1.24 mg/dL   Calcium 8.8 (L) 8.9 - 10.3 mg/dL   GFR calc non Af Amer >60 >60 mL/min   GFR calc Af Amer >60 >60 mL/min    Comment: (NOTE) The eGFR has been calculated using the CKD EPI equation. This calculation has not  been validated in all clinical situations. eGFR's persistently <60 mL/min signify possible Chronic Kidney Disease.    Anion gap 11  5 - 15  CBC     Status: Abnormal   Collection Time: 10/19/16  2:07 AM  Result Value Ref Range   WBC 9.2 4.0 - 10.5 K/uL   RBC 4.41 4.22 - 5.81 MIL/uL   Hemoglobin 14.6 13.0 - 17.0 g/dL   HCT 44.9 39.0 - 52.0 %   MCV 101.8 (H) 78.0 - 100.0 fL   MCH 33.1 26.0 - 34.0 pg   MCHC 32.5 30.0 - 36.0 g/dL   RDW 14.3 11.5 - 15.5 %   Platelets 204 150 - 400 K/uL  Magnesium     Status: None   Collection Time: 10/19/16  2:07 AM  Result Value Ref Range   Magnesium 2.0 1.7 - 2.4 mg/dL  Troponin I     Status: Abnormal   Collection Time: 10/19/16  2:07 AM  Result Value Ref Range   Troponin I 0.09 (HH) <0.03 ng/mL    Comment: CRITICAL VALUE NOTED.  VALUE IS CONSISTENT WITH PREVIOUSLY REPORTED AND CALLED VALUE.    Imaging: Dg Chest 2 View  Result Date: 10/18/2016 CLINICAL DATA:  Subacute onset of shortness of breath and dyspnea on exertion. Lower extremity edema. Initial encounter. EXAM: CHEST  2 VIEW COMPARISON:  Chest radiograph performed earlier today at 4:13 p.m. FINDINGS: The lungs are well-aerated. Mild vascular congestion is noted. Persistent right basilar opacity may reflect mild pneumonia. There is no evidence of pleural effusion or pneumothorax. The heart is normal in size; the mediastinal contour is within normal limits. No acute osseous abnormalities are seen. Chronic right-sided rib deformities are noted. The patient's right shoulder arthroplasty is incompletely imaged on this study. IMPRESSION: Mild vascular congestion noted. Persistent right basilar opacity raises concern for mild pneumonia. Electronically Signed   By: Garald Balding M.D.   On: 10/18/2016 00:14    Assessment:  Principal Problem:   Acute systolic congestive heart failure, NYHA class 4 (HCC) Active Problems:   Hyponatremia   AKI (acute kidney injury) ([place])   Plan: 1. Clinically  improving with diuresis. Weight down to 231 lbs. Echo yesterday shows LVEF 20-25%, global HK with regional abnormalities. NSVT overnight. Increase coreg to 6.25 mg BID tonight. On lisinopril despite resolving AKI. Will continue for now. Continue IV lasix today and likely switch to po lasix tomorrow. Will need L/RHC on Monday. He is agreeable to this - his sister is a pediatric cardiologist in [place] and he has discussed his situation with her. Suspect this could be alcohol-related or there may be multivessel CAD.  Time Spent Directly with Patient:  15 minutes  Length of Stay:  LOS: 2 days   Pixie Casino, MD, [facility] Attending Cardiologist [place] 10/19/2016, 1:16 PM

## 2016-10-20 LAB — BASIC METABOLIC PANEL
Anion gap: 8 (ref 5–15)
BUN: 16 mg/dL (ref 6–20)
CALCIUM: 9.3 mg/dL (ref 8.9–10.3)
CO2: 33 mmol/L — ABNORMAL HIGH (ref 22–32)
CREATININE: 1.14 mg/dL (ref 0.61–1.24)
Chloride: 98 mmol/L — ABNORMAL LOW (ref 101–111)
GFR calc Af Amer: >60 mL/min (ref >60)
GLUCOSE: 105 mg/dL — AB (ref 65–99)
POTASSIUM: 4.2 mmol/L (ref 3.5–5.1)
SODIUM: 139 mmol/L (ref 135–145)

## 2016-10-20 MED ORDER — SODIUM CHLORIDE 0.9% FLUSH
3.0000 mL | INTRAVENOUS | Status: DC | PRN
Start: 2016-10-20 — End: 2016-10-21

## 2016-10-20 MED ORDER — SALINE SPRAY 0.65 % NA SOLN
1.0000 | NASAL | Status: DC | PRN
  Filled 2016-10-20: qty 44

## 2016-10-20 MED ORDER — SODIUM CHLORIDE 0.9% FLUSH
3.0000 mL | Freq: Two times a day (BID) | INTRAVENOUS | Status: DC
  Administered 2016-10-20 – 2016-10-21 (×2): 3 mL via INTRAVENOUS

## 2016-10-20 MED ORDER — SODIUM CHLORIDE 0.9 % IV SOLN: 250.0000 mL | INTRAVENOUS | Status: DC | PRN

## 2016-10-20 MED ORDER — FLUTICASONE PROPIONATE 50 MCG/ACT NA SUSP
2.0000 | Freq: Every day | NASAL | Status: DC
  Administered 2016-10-20 – 2016-10-22 (×2): 2 via NASAL
  Filled 2016-10-20: qty 16

## 2016-10-20 MED ORDER — SODIUM CHLORIDE 0.9 % IV SOLN
INTRAVENOUS | Status: DC
  Administered 2016-10-21: 08:00:00 via INTRAVENOUS

## 2016-10-20 NOTE — Progress Notes (Signed)
DAILY PROGRESS NOTE  Subjective:  Diuresed another 1.8L negative O/N - now 4.4L negative. Creatinine back up to 1.14.   Objective:  Temp:  [98 F (36.7 C)-98.1 F (36.7 C)] 98.1 F (36.7 C) (02/04 0511) Pulse Rate:  [79-92] 92 (02/04 0511) Resp:  [18-20] 18 (02/04 0511) BP: (112-130)/(74-90) 130/90 (02/04 0511) SpO2:  [96 %-100 %] 100 % (02/04 0511) Weight:  [227 lb 14.4 oz (103.4 kg)] 227 lb 14.4 oz (103.4 kg) (02/04 0511) Weight change: -3 lb 1.6 oz (-1.406 kg)  Intake/Output from previous day: 02/03 0701 - 02/04 0700 In: 720 [P.O.:720] Out: 2600 [Urine:2600]  Intake/Output from this shift: Total I/O In: 120 [P.O.:120] Out: 800 [Urine:800]  Medications: No current facility-administered medications on file prior to encounter.    Current Outpatient Prescriptions on File Prior to Encounter  Medication Sig Dispense Refill  . escitalopram (LEXAPRO) 10 MG tablet TAKE 1 TABLET (10 MG TOTAL) BY MOUTH DAILY.  2  . LORazepam (ATIVAN) 1 MG tablet Take 0.5-1 mg by mouth 3 (three) times daily as needed for anxiety or sleep.   0  . Multiple Vitamins-Minerals (MULTIVITAMIN WITH MINERALS) tablet Take 1 tablet by mouth daily.      Physical Exam: General appearance: alert and no distress Neck: no carotid bruit and no JVD Lungs: clear to auscultation bilaterally Heart: regular rate and rhythm Abdomen: soft, non-tender; bowel sounds normal; no masses,  no organomegaly Extremities: extremities normal, atraumatic, no cyanosis or edema Pulses: 2+ and symmetric Skin: Skin color, texture, turgor normal. No rashes or lesions Neurologic: Grossly normal Psych: Pleasant, jovial  Lab Results: Results for orders placed or performed during the hospital encounter of 10/17/16 (from the past 48 hour(s))  Troponin I     Status: Abnormal   Collection Time: 10/18/16  2:30 PM  Result Value Ref Range   Troponin I 0.10 (HH) <0.03 ng/mL    Comment: CRITICAL VALUE NOTED.  VALUE IS CONSISTENT  WITH PREVIOUSLY REPORTED AND CALLED VALUE.  Troponin I     Status: Abnormal   Collection Time: 10/18/16  8:35 PM  Result Value Ref Range   Troponin I 0.12 (HH) <0.03 ng/mL    Comment: CRITICAL VALUE NOTED.  VALUE IS CONSISTENT WITH PREVIOUSLY REPORTED AND CALLED VALUE.  Basic metabolic panel     Status: Abnormal   Collection Time: 10/19/16  2:07 AM  Result Value Ref Range   Sodium 139 135 - 145 mmol/L   Potassium 3.7 3.5 - 5.1 mmol/L   Chloride 101 101 - 111 mmol/L   CO2 27 22 - 32 mmol/L   Glucose, Bld 117 (H) 65 - 99 mg/dL   BUN 14 6 - 20 mg/dL   Creatinine, Ser 0.99 0.61 - 1.24 mg/dL   Calcium 8.8 (L) 8.9 - 10.3 mg/dL   GFR calc non Af Amer >60 >60 mL/min   GFR calc Af Amer >60 >60 mL/min    Comment: (NOTE) The eGFR has been calculated using the CKD EPI equation. This calculation has not been validated in all clinical situations. eGFR's persistently <60 mL/min signify possible Chronic Kidney Disease.    Anion gap 11 5 - 15  CBC     Status: Abnormal   Collection Time: 10/19/16  2:07 AM  Result Value Ref Range   WBC 9.2 4.0 - 10.5 K/uL   RBC 4.41 4.22 - 5.81 MIL/uL   Hemoglobin 14.6 13.0 - 17.0 g/dL   HCT 44.9 39.0 - 52.0 %   MCV 101.8 (H)  78.0 - 100.0 fL   MCH 33.1 26.0 - 34.0 pg   MCHC 32.5 30.0 - 36.0 g/dL   RDW 14.3 11.5 - 15.5 %   Platelets 204 150 - 400 K/uL  Magnesium     Status: None   Collection Time: 10/19/16  2:07 AM  Result Value Ref Range   Magnesium 2.0 1.7 - 2.4 mg/dL  Troponin I     Status: Abnormal   Collection Time: 10/19/16  2:07 AM  Result Value Ref Range   Troponin I 0.09 (HH) <0.03 ng/mL    Comment: CRITICAL VALUE NOTED.  VALUE IS CONSISTENT WITH PREVIOUSLY REPORTED AND CALLED VALUE.  Basic metabolic panel     Status: Abnormal   Collection Time: 10/20/16  4:29 AM  Result Value Ref Range   Sodium 139 135 - 145 mmol/L   Potassium 4.2 3.5 - 5.1 mmol/L   Chloride 98 (L) 101 - 111 mmol/L   CO2 33 (H) 22 - 32 mmol/L   Glucose, Bld 105 (H) 65 - 99  mg/dL   BUN 16 6 - 20 mg/dL   Creatinine, Ser 1.14 0.61 - 1.24 mg/dL   Calcium 9.3 8.9 - 10.3 mg/dL   GFR calc non Af Amer >60 >60 mL/min   GFR calc Af Amer >60 >60 mL/min    Comment: (NOTE) The eGFR has been calculated using the CKD EPI equation. This calculation has not been validated in all clinical situations. eGFR's persistently <60 mL/min signify possible Chronic Kidney Disease.    Anion gap 8 5 - 15    Imaging: No results found.  Assessment:  Principal Problem:   Acute systolic congestive heart failure, NYHA class 4 (HCC) Active Problems:   Hyponatremia   AKI (acute kidney injury) ([place])   Plan: 1. Clinically improving with diuresis. Weight now 227 lbs. Echo showed LVEF 20-25%, global HK with regional abnormalities. No NSVT overnight on increased dose coreg. BP normal. On lisinopril despite resolving AKI. Will continue for now. On lasix 40 mg po daily starting today. Will need L/RHC on Monday. Suspect this could be alcohol-related or there may be multivessel CAD.  Time Spent Directly with Patient:  15 minutes  Length of Stay:  LOS: 3 days   Pixie Casino, MD, [facility] Attending Cardiologist [place] 10/20/2016, 12:10 PM

## 2016-10-20 NOTE — Progress Notes (Signed)
Pt has a 7 beats of v-tach around 1.43 pm, pt was moving that time, MD aware, vitals stable, pt is stable,will continue to monitor the patient

## 2016-10-20 NOTE — Progress Notes (Signed)
Pt is scheduled for cardiac cath tomorrow , NPO since 5:am till then clear liquid diet since midnight, consent has been taken. Pt is aware about the procedure, will continue to monitor the patient.

## 2016-10-20 NOTE — Progress Notes (Signed)
Patient ID: John [name], male   DOB: January 10, 1961, 56 y.o.   MRN: [id number]    PROGRESS NOTE    John [name]  OXB:[id number] DOB: 25-Mar-1961 DOA: 10/17/2016  PCP: Margaree Mackintosh, MD   Brief Narrative:  56 y.o. male with a past medical history significant for anxiety/depression who presented with few weeks worsening SOB with exertion and progressive leg swelling, weight gain, pulmonary edema on CXR and elevated BNP.  Assessment & Plan:   Principal Problem:   Acute respiratory failure due to Acute systolic congestive heart failure, NYHA class 4 (HCC) - EF 25% on ECHO, needs LHC on Monday - continue Lasix - weight trend since admission:  Filed Weights   10/18/16 0356 10/19/16 0603 10/20/16 0511  Weight: 107.6 kg (237 lb 4.8 oz) 104.8 kg (231 lb) 103.4 kg (227 lb 14.4 oz)  - monitor daily weights, strict I/O  Active Problems:   Hyponatremia - in the setting of volume overload - resolved - BMP in AM    AKI (acute kidney injury) (HCC) - in the setting of acute systolic CHF - resolved, close monitoring while in IV Lasix     Obesity  - Body mass index is 31.79 kg/m.  DVT prophylaxis: Lovenox SQ Code Status: Full  Family Communication: Patient at bedside  Disposition Plan: Home pending cath results   Consultants:   Cardiology   Procedures:   None   Antimicrobials:   None   Subjective: Reports feeling better.   Objective: Vitals:   10/19/16 0603 10/19/16 1406 10/19/16 2035 10/20/16 0511  BP: (!) 140/98 123/76 112/74 130/90  Pulse: 85 91 79 92  Resp: 18 20 18 18   Temp: 98.4 F (36.9 C) 98 F (36.7 C) 98.1 F (36.7 C) 98.1 F (36.7 C)  TempSrc: Oral Oral Oral Oral  SpO2:  96% 97% 100%  Weight: 104.8 kg (231 lb)   103.4 kg (227 lb 14.4 oz)  Height:        Intake/Output Summary (Last 24 hours) at 10/20/16 1328 Last data filed at 10/20/16 0856  Gross per 24 hour  Intake              360 ml  Output             2100 ml  Net            -1740 ml    Filed Weights   10/18/16 0356 10/19/16 0603 10/20/16 0511  Weight: 107.6 kg (237 lb 4.8 oz) 104.8 kg (231 lb) 103.4 kg (227 lb 14.4 oz)    Examination:  General exam: Appears calm and comfortable  Respiratory system: Respiratory effort normal. Cardiovascular system: S1 & S2 heard, RRR. No JVD, murmurs, rubs, gallops or clicks. LE edema Gastrointestinal system: Abdomen is nondistended, soft and nontender. No organomegaly or masses felt. Normal bowel sounds heard. Central nervous system: Alert and oriented. No focal neurological deficits.    Data Reviewed: I have personally reviewed following labs and imaging studies  CBC:  Recent Labs Lab 10/17/16 2013 10/19/16 0207  WBC 10.5 9.2  HGB 14.9 14.6  HCT 46.6 44.9  MCV 101.7* 101.8*  PLT 220 204   Basic Metabolic Panel:  Recent Labs Lab 10/17/16 2013 10/18/16 1109 10/19/16 0207 10/20/16 0429  NA 132* 139 139 139  K 4.1 3.5 3.7 4.2  CL 101 102 101 98*  CO2 20* 25 27 33*  GLUCOSE 101* 115* 117* 105*  BUN 17 18 14 16   CREATININE 1.27* 1.14 0.99 1.14  CALCIUM 9.1 9.1 8.8* 9.3  MG  --  2.1 2.0  --    Cardiac Enzymes:  Recent Labs Lab 10/18/16 1109 10/18/16 1430 10/18/16 2035 10/19/16 0207  TROPONINI 0.10* 0.10* 0.12* 0.09*   Thyroid Function Tests:  Recent Labs  10/17/16 2013  TSH 3.501   Urine analysis:  Radiology Studies: No results found.    Scheduled Meds: . carvedilol  6.25 mg Oral BID WC  . enoxaparin (LOVENOX) injection  40 mg Subcutaneous Q24H  . escitalopram  10 mg Oral Daily  . furosemide  40 mg Oral Daily  . lisinopril  5 mg Oral Daily  . potassium chloride  20 mEq Oral BID   Continuous Infusions:   LOS: 3 days    Time spent: 20 minutes    Debbora Presto, MD [facility] Pager [phone]  If 7PM-7AM, please contact night-coverage [email or web address] Password [email or web address] 10/20/2016, 1:28 PM

## 2016-10-21 ENCOUNTER — Encounter (HOSPITAL_COMMUNITY)
Admission: EM | Disposition: A | Discharge status: Home / Self Care | Payer: Self-pay | Source: Home / Self Care | Attending: Internal Medicine

## 2016-10-21 HISTORY — PX: RIGHT/LEFT HEART CATH AND CORONARY ANGIOGRAPHY: CATH118266

## 2016-10-21 LAB — CBC
HCT: 45.7 % (ref 39.0–52.0)
HEMOGLOBIN: 14.6 g/dL (ref 13.0–17.0)
MCH: 33.2 pg (ref 26.0–34.0)
MCHC: 31.9 g/dL (ref 30.0–36.0)
MCV: 103.9 fL — ABNORMAL HIGH (ref 78.0–100.0)
Platelets: 205 10*3/uL (ref 150–400)
RBC: 4.4 MIL/uL (ref 4.22–5.81)
RDW: 14 % (ref 11.5–15.5)
WBC: 8.3 10*3/uL (ref 4.0–10.5)

## 2016-10-21 LAB — POCT I-STAT 3, ART BLOOD GAS (G3+)
Acid-Base Excess: 3 mmol/L — ABNORMAL HIGH (ref 0.0–2.0)
BICARBONATE: 27.5 mmol/L (ref 20.0–28.0)
O2 Saturation: 99 %
PH ART: 7.427 (ref 7.350–7.450)
TCO2: 29 mmol/L (ref 0–100)
pCO2 arterial: 41.7 mmHg (ref 32.0–48.0)
pO2, Arterial: 133 mmHg — ABNORMAL HIGH (ref 83.0–108.0)

## 2016-10-21 LAB — BASIC METABOLIC PANEL
ANION GAP: 11 (ref 5–15)
BUN: 14 mg/dL (ref 6–20)
CALCIUM: 9.3 mg/dL (ref 8.9–10.3)
CHLORIDE: 96 mmol/L — AB (ref 101–111)
CO2: 29 mmol/L (ref 22–32)
Creatinine, Ser: 1.07 mg/dL (ref 0.61–1.24)
GFR calc non Af Amer: >60 mL/min (ref >60)
Glucose, Bld: 104 mg/dL — ABNORMAL HIGH (ref 65–99)
Potassium: 4.4 mmol/L (ref 3.5–5.1)
SODIUM: 136 mmol/L (ref 135–145)

## 2016-10-21 LAB — POCT I-STAT 3, VENOUS BLOOD GAS (G3P V)
ACID-BASE EXCESS: 4 mmol/L — AB (ref 0.0–2.0)
Bicarbonate: 29.3 mmol/L — ABNORMAL HIGH (ref 20.0–28.0)
O2 SAT: 61 %
PCO2 VEN: 46.7 mmHg (ref 44.0–60.0)
TCO2: 31 mmol/L (ref 0–100)
pH, Ven: 7.406 (ref 7.250–7.430)
pO2, Ven: 32 mmHg (ref 32.0–45.0)

## 2016-10-21 LAB — PROTIME-INR
INR: 1.11
PROTHROMBIN TIME: 14.4 s (ref 11.4–15.2)

## 2016-10-21 SURGERY — RIGHT/LEFT HEART CATH AND CORONARY ANGIOGRAPHY
Anesthesia: LOCAL

## 2016-10-21 MED ORDER — SODIUM CHLORIDE 0.9 % IV SOLN: INTRAVENOUS | Status: AC

## 2016-10-21 MED ORDER — FUROSEMIDE 40 MG PO TABS: 40.0000 mg | ORAL_TABLET | Freq: Every day | ORAL | 1 refills | Status: DC

## 2016-10-21 MED ORDER — LORAZEPAM 1 MG PO TABS: 0.5000 mg | ORAL_TABLET | Freq: Three times a day (TID) | ORAL | 0 refills | Status: DC | PRN

## 2016-10-21 MED ORDER — ASPIRIN 81 MG PO CHEW
81.0000 mg | CHEWABLE_TABLET | Freq: Every day | ORAL | Status: DC
  Administered 2016-10-22: 81 mg via ORAL
  Filled 2016-10-21: qty 1

## 2016-10-21 MED ORDER — FLUTICASONE PROPIONATE 50 MCG/ACT NA SUSP: 2.0000 | Freq: Every day | NASAL | 3 refills | Status: DC

## 2016-10-21 MED ORDER — ONDANSETRON HCL 4 MG/2ML IJ SOLN: 4.0000 mg | Freq: Four times a day (QID) | INTRAMUSCULAR | Status: DC | PRN

## 2016-10-21 MED ORDER — IOPAMIDOL (ISOVUE-370) INJECTION 76%
INTRAVENOUS | Status: AC
  Filled 2016-10-21: qty 100

## 2016-10-21 MED ORDER — VERAPAMIL HCL 2.5 MG/ML IV SOLN
INTRAVENOUS | Status: AC
  Filled 2016-10-21: qty 2

## 2016-10-21 MED ORDER — FENTANYL CITRATE (PF) 100 MCG/2ML IJ SOLN
INTRAMUSCULAR | Status: AC
  Filled 2016-10-21: qty 2

## 2016-10-21 MED ORDER — LIDOCAINE HCL (PF) 1 % IJ SOLN
INTRAMUSCULAR | Status: DC | PRN
  Administered 2016-10-21: 15 mL

## 2016-10-21 MED ORDER — LIDOCAINE HCL (PF) 1 % IJ SOLN
INTRAMUSCULAR | Status: AC
  Filled 2016-10-21: qty 30

## 2016-10-21 MED ORDER — HEPARIN (PORCINE) IN NACL 2-0.9 UNIT/ML-% IJ SOLN
INTRAMUSCULAR | Status: AC
  Filled 2016-10-21: qty 1000

## 2016-10-21 MED ORDER — MIDAZOLAM HCL 2 MG/2ML IJ SOLN
INTRAMUSCULAR | Status: DC | PRN
  Administered 2016-10-21 (×2): 1 mg via INTRAVENOUS

## 2016-10-21 MED ORDER — FENTANYL CITRATE (PF) 100 MCG/2ML IJ SOLN
INTRAMUSCULAR | Status: DC | PRN
  Administered 2016-10-21 (×2): 25 ug via INTRAVENOUS

## 2016-10-21 MED ORDER — HEPARIN (PORCINE) IN NACL 2-0.9 UNIT/ML-% IJ SOLN
INTRAMUSCULAR | Status: DC | PRN
  Administered 2016-10-21: 1000 mL

## 2016-10-21 MED ORDER — POTASSIUM CHLORIDE CRYS ER 20 MEQ PO TBCR: 20.0000 meq | EXTENDED_RELEASE_TABLET | Freq: Every day | ORAL | 1 refills | Status: DC

## 2016-10-21 MED ORDER — ACETAMINOPHEN 325 MG PO TABS: 650.0000 mg | ORAL_TABLET | ORAL | Status: DC | PRN

## 2016-10-21 MED ORDER — CARVEDILOL 6.25 MG PO TABS: 6.2500 mg | ORAL_TABLET | Freq: Two times a day (BID) | ORAL | 1 refills | Status: DC

## 2016-10-21 MED ORDER — HEPARIN SODIUM (PORCINE) 1000 UNIT/ML IJ SOLN
INTRAMUSCULAR | Status: AC
  Filled 2016-10-21: qty 1

## 2016-10-21 MED ORDER — MIDAZOLAM HCL 2 MG/2ML IJ SOLN
INTRAMUSCULAR | Status: AC
  Filled 2016-10-21: qty 2

## 2016-10-21 MED ORDER — FUROSEMIDE 10 MG/ML IJ SOLN
40.0000 mg | Freq: Two times a day (BID) | INTRAMUSCULAR | Status: DC
  Administered 2016-10-21 – 2016-10-22 (×2): 40 mg via INTRAVENOUS
  Filled 2016-10-21 (×2): qty 4

## 2016-10-21 MED ORDER — LISINOPRIL 5 MG PO TABS: 5.0000 mg | ORAL_TABLET | Freq: Every day | ORAL | 1 refills | Status: DC

## 2016-10-21 MED ORDER — IOPAMIDOL (ISOVUE-370) INJECTION 76%
INTRAVENOUS | Status: DC | PRN
  Administered 2016-10-21: 45 mL via INTRA_ARTERIAL

## 2016-10-21 MED ORDER — SODIUM CHLORIDE 0.9% FLUSH
3.0000 mL | Freq: Two times a day (BID) | INTRAVENOUS | Status: DC
  Administered 2016-10-21 – 2016-10-22 (×2): 3 mL via INTRAVENOUS

## 2016-10-21 MED ORDER — ASPIRIN 81 MG PO CHEW
81.0000 mg | CHEWABLE_TABLET | Freq: Once | ORAL | Status: AC
  Administered 2016-10-21: 81 mg via ORAL
  Filled 2016-10-21: qty 1

## 2016-10-21 MED ORDER — SODIUM CHLORIDE 0.9 % IV SOLN: 250.0000 mL | INTRAVENOUS | Status: DC | PRN

## 2016-10-21 MED ORDER — SODIUM CHLORIDE 0.9% FLUSH: 3.0000 mL | INTRAVENOUS | Status: DC | PRN

## 2016-10-21 MED ORDER — MORPHINE SULFATE (PF) 2 MG/ML IV SOLN: 2.0000 mg | INTRAVENOUS | Status: DC | PRN

## 2016-10-21 SURGICAL SUPPLY — 12 items
CATH INFINITI 5FR MULTPACK ANG (CATHETERS) ×2 IMPLANT
CATH SWAN GANZ 7F STRAIGHT (CATHETERS) ×2 IMPLANT
DEVICE CLOSURE MYNXGRIP 5F (Vascular Products) ×2 IMPLANT
KIT HEART LEFT (KITS) ×2 IMPLANT
PACK CARDIAC CATHETERIZATION (CUSTOM PROCEDURE TRAY) ×2 IMPLANT
SHEATH PINNACLE 5F 10CM (SHEATH) ×2 IMPLANT
SHEATH PINNACLE 7F 10CM (SHEATH) ×2 IMPLANT
SYR MEDRAD MARK V 150ML (SYRINGE) ×2 IMPLANT
TRANSDUCER W/STOPCOCK (MISCELLANEOUS) ×2 IMPLANT
TUBING CIL FLEX 10 FLL-RA (TUBING) ×2 IMPLANT
WIRE EMERALD 3MM-J .025X260CM (WIRE) ×2 IMPLANT
WIRE EMERALD 3MM-J .035X150CM (WIRE) ×2 IMPLANT

## 2016-10-21 NOTE — Interval H&P Note (Signed)
Cath Lab Visit (complete for each Cath Lab visit)  Clinical Evaluation Leading to the Procedure:   ACS: No.  Non-ACS:    Anginal Classification: CCS II  Anti-ischemic medical therapy: No Therapy  Non-Invasive Test Results: No non-invasive testing performed  Prior CABG: No previous CABG      History and Physical Interval Note:  10/21/2016 3:08 PM  John [name]  has presented today for surgery, with the diagnosis of unstable angina  The various methods of treatment have been discussed with the patient and family. After consideration of risks, benefits and other options for treatment, the patient has consented to  Procedure(s): Right/Left Heart Cath and Coronary Angiography (N/A) as a surgical intervention .  The patient's history has been reviewed, patient examined, no change in status, stable for surgery.  I have reviewed the patient's chart and labs.  Questions were answered to the patient's satisfaction.     Nanetta Batty

## 2016-10-21 NOTE — H&P (View-Only) (Signed)
DAILY PROGRESS NOTE  Subjective:  Patient reports feeling well and has no complaints. Denies having any CP, SOB, palpitations, or diaphoresis. States his LE edema has resolved.   Objective:  Temp:  [97.8 F (36.6 C)-98.2 F (36.8 C)] 97.8 F (36.6 C) (02/05 0649) Pulse Rate:  [76-89] 89 (02/05 0649) Resp:  [16-18] 16 (02/05 0649) BP: (99-134)/(62-100) 134/100 (02/05 0649) SpO2:  [97 %-100 %] 100 % (02/05 0649) Weight:  [103.9 kg (229 lb 1.6 oz)] 103.9 kg (229 lb 1.6 oz) (02/05 0649) Weight change: 0.544 kg (1 lb 3.2 oz)  Intake/Output from previous day: 02/04 0701 - 02/05 0700 In: 840 [P.O.:840] Out: 2100 [Urine:2100]  Intake/Output from this shift: Total I/O In: 0  Out: 525 [Urine:525]  Medications: No current facility-administered medications on file prior to encounter.    Current Outpatient Prescriptions on File Prior to Encounter  Medication Sig Dispense Refill  . escitalopram (LEXAPRO) 10 MG tablet TAKE 1 TABLET (10 MG TOTAL) BY MOUTH DAILY.  2  . LORazepam (ATIVAN) 1 MG tablet Take 0.5-1 mg by mouth 3 (three) times daily as needed for anxiety or sleep.   0  . Multiple Vitamins-Minerals (MULTIVITAMIN WITH MINERALS) tablet Take 1 tablet by mouth daily.      Physical Exam: General appearance: alert and no distress Neck: no carotid bruit and no JVD Lungs: clear to auscultation bilaterally Heart: regular rate and rhythm Abdomen: soft, non-tender; bowel sounds normal; no masses,  no organomegaly Extremities: extremities normal, atraumatic, no cyanosis or edema Pulses: 2+ and symmetric Skin: Skin color, texture, turgor normal. No rashes or lesions Neurologic: Grossly normal Psych: Pleasant, jovial  Lab Results: Results for orders placed or performed during the hospital encounter of 10/17/16 (from the past 48 hour(s))  Basic metabolic panel     Status: Abnormal   Collection Time: 10/20/16  4:29 AM  Result Value Ref Range   Sodium 139 135 - 145 mmol/L   Potassium 4.2 3.5 - 5.1 mmol/L   Chloride 98 (L) 101 - 111 mmol/L   CO2 33 (H) 22 - 32 mmol/L   Glucose, Bld 105 (H) 65 - 99 mg/dL   BUN 16 6 - 20 mg/dL   Creatinine, Ser 1.14 0.61 - 1.24 mg/dL   Calcium 9.3 8.9 - 10.3 mg/dL   GFR calc non Af Amer >60 >60 mL/min   GFR calc Af Amer >60 >60 mL/min    Comment: (NOTE) The eGFR has been calculated using the CKD EPI equation. This calculation has not been validated in all clinical situations. eGFR's persistently <60 mL/min signify possible Chronic Kidney Disease.    Anion gap 8 5 - 15  Basic metabolic panel     Status: Abnormal   Collection Time: 10/21/16  4:16 AM  Result Value Ref Range   Sodium 136 135 - 145 mmol/L   Potassium 4.4 3.5 - 5.1 mmol/L   Chloride 96 (L) 101 - 111 mmol/L   CO2 29 22 - 32 mmol/L   Glucose, Bld 104 (H) 65 - 99 mg/dL   BUN 14 6 - 20 mg/dL   Creatinine, Ser 1.07 0.61 - 1.24 mg/dL   Calcium 9.3 8.9 - 10.3 mg/dL   GFR calc non Af Amer >60 >60 mL/min   GFR calc Af Amer >60 >60 mL/min    Comment: (NOTE) The eGFR has been calculated using the CKD EPI equation. This calculation has not been validated in all clinical situations. eGFR's persistently <60 mL/min signify possible Chronic Kidney Disease.  Anion gap 11 5 - 15  CBC     Status: Abnormal   Collection Time: 10/21/16  4:16 AM  Result Value Ref Range   WBC 8.3 4.0 - 10.5 K/uL   RBC 4.40 4.22 - 5.81 MIL/uL   Hemoglobin 14.6 13.0 - 17.0 g/dL   HCT 45.7 39.0 - 52.0 %   MCV 103.9 (H) 78.0 - 100.0 fL   MCH 33.2 26.0 - 34.0 pg   MCHC 31.9 30.0 - 36.0 g/dL   RDW 14.0 11.5 - 15.5 %   Platelets 205 150 - 400 K/uL  Protime-INR     Status: None   Collection Time: 10/21/16  4:16 AM  Result Value Ref Range   Prothrombin Time 14.4 11.4 - 15.2 seconds   INR 1.11     Imaging: No results found.  Assessment:  Principal Problem:   Acute systolic congestive heart failure, NYHA class 4 (HCC) Active Problems:   Hyponatremia   AKI (acute kidney injury)  ([place])   Plan: Suspect heart failure is in the setting of alcohol-use vs. possbile multivessel CAD. Clinically improving with diuresis. -6.2L since admission and weight down by 8 lbs. On lasix 40 mg po daily. Echo showed LVEF 20-25%, global HK with regional abnormalities. Had a few runs of NSVT overnight, longest was 22 beats. Patient remained asymptomatic. BP normal. On lisinopril, SCr improved from 1.4 yesterday to 1.0 today. Scheduled for L/RHC today.  -Continue Lasix 40 mg QD -Increase dose of Carvedilol ---- -Continue lisinopril   Shela Leff 10/21/2016, 9:06 AM   I have personally seen and examined this patient with Dr. Marlowe Sax. I agree with the assessment and plan as outlined above. Admitted with CHF. LVEF=20-25%. He has diuresed well. Weight down 8 lbs. Pt is scheduled for right and left cardiac cath today.   Lauree Chandler 10/21/2016 10:55 AM

## 2016-10-21 NOTE — Progress Notes (Signed)
Patient ID: John [name], male   DOB: 03/26/1961, 56 y.o.   MRN: [id number]    PROGRESS NOTE    John [name]  XLK:[id number] DOB: 10/06/1960 DOA: 10/17/2016  PCP: Margaree Mackintosh, MD   Brief Narrative:  56 y.o. male with a past medical history significant for anxiety/depression who presented with few weeks worsening SOB with exertion and progressive leg swelling, weight gain, pulmonary edema on CXR and elevated BNP.  Assessment & Plan:   Principal Problem:   Acute respiratory failure due to Acute systolic congestive heart failure, NYHA class 4 (HCC) - EF 25% on ECHO, needs LHC today, further recommendations pending cath results  - continue Lasix - weight trend since admission:  Filed Weights   10/19/16 0603 10/20/16 0511 10/21/16 0649  Weight: 104.8 kg (231 lb) 103.4 kg (227 lb 14.4 oz) 103.9 kg (229 lb 1.6 oz)  - monitor daily weights, strict I/O  Active Problems:   Hyponatremia - in the setting of volume overload - resolved - BMP in AM    AKI (acute kidney injury) (HCC) - in the setting of acute systolic CHF - resolved, close monitoring while in IV Lasix     Obesity  - Body mass index is 31.95 kg/m.  DVT prophylaxis: Lovenox SQ Code Status: Full  Family Communication: Patient at bedside  Disposition Plan: Home pending cath results   Consultants:   Cardiology   Procedures:   None   Antimicrobials:   None   Subjective: Reports feeling better.   Objective: Vitals:   10/21/16 1755 10/21/16 1810 10/21/16 1840 10/21/16 1910  BP: 137/89 126/75 112/80 123/82  Pulse: 85 78 79 76  Resp: 18 16 18 18   Temp:      TempSrc:      SpO2: 95% 96% 97% 98%  Weight:      Height:        Intake/Output Summary (Last 24 hours) at 10/21/16 2022 Last data filed at 10/21/16 1929  Gross per 24 hour  Intake              240 ml  Output             2525 ml  Net            -2285 ml   Filed Weights   10/19/16 0603 10/20/16 0511 10/21/16 0649  Weight: 104.8 kg (231  lb) 103.4 kg (227 lb 14.4 oz) 103.9 kg (229 lb 1.6 oz)    Examination:  General exam: Appears calm and comfortable  Respiratory system: Respiratory effort normal. Cardiovascular system: S1 & S2 heard, RRR. No JVD, murmurs, rubs, gallops or clicks. LE edema Gastrointestinal system: Abdomen is nondistended, soft and nontender. No organomegaly or masses felt. Normal bowel sounds heard. Central nervous system: Alert and oriented. No focal neurological deficits.    Data Reviewed: I have personally reviewed following labs and imaging studies  CBC:  Recent Labs Lab 10/17/16 2013 10/19/16 0207 10/21/16 0416  WBC 10.5 9.2 8.3  HGB 14.9 14.6 14.6  HCT 46.6 44.9 45.7  MCV 101.7* 101.8* 103.9*  PLT 220 204 205   Basic Metabolic Panel:  Recent Labs Lab 10/17/16 2013 10/18/16 1109 10/19/16 0207 10/20/16 0429 10/21/16 0416  NA 132* 139 139 139 136  K 4.1 3.5 3.7 4.2 4.4  CL 101 102 101 98* 96*  CO2 20* 25 27 33* 29  GLUCOSE 101* 115* 117* 105* 104*  BUN 17 18 14 16 14   CREATININE 1.27* 1.14 0.99 1.14 1.07  CALCIUM 9.1 9.1 8.8* 9.3 9.3  MG  --  2.1 2.0  --   --    Cardiac Enzymes:  Recent Labs Lab 10/18/16 1109 10/18/16 1430 10/18/16 2035 10/19/16 0207  TROPONINI 0.10* 0.10* 0.12* 0.09*   Thyroid Function Tests: No results for input(s): TSH, T4TOTAL, FREET4, T3FREE, THYROIDAB in the last 72 hours. Urine analysis:  Radiology Studies: No results found.    Scheduled Meds: . [START ON 10/22/2016] aspirin  81 mg Oral Daily  . carvedilol  6.25 mg Oral BID WC  . enoxaparin (LOVENOX) injection  40 mg Subcutaneous Q24H  . escitalopram  10 mg Oral Daily  . fluticasone  2 spray Each Nare Daily  . furosemide  40 mg Intravenous Q12H  . lisinopril  5 mg Oral Daily  . potassium chloride  20 mEq Oral BID  . sodium chloride flush  3 mL Intravenous Q12H   Continuous Infusions: . sodium chloride       LOS: 4 days    Time spent: 20 minutes    Debbora Presto,  MD [facility] Pager [phone]  If 7PM-7AM, please contact night-coverage [email or web address] Password [email or web address] 10/21/2016, 8:22 PM

## 2016-10-21 NOTE — Progress Notes (Addendum)
DAILY PROGRESS NOTE  Subjective:  Patient reports feeling well and has no complaints. Denies having any CP, SOB, palpitations, or diaphoresis. States his LE edema has resolved.   Objective:  Temp:  [97.8 F (36.6 C)-98.2 F (36.8 C)] 97.8 F (36.6 C) (02/05 0649) Pulse Rate:  [76-89] 89 (02/05 0649) Resp:  [16-18] 16 (02/05 0649) BP: (99-134)/(62-100) 134/100 (02/05 0649) SpO2:  [97 %-100 %] 100 % (02/05 0649) Weight:  [103.9 kg (229 lb 1.6 oz)] 103.9 kg (229 lb 1.6 oz) (02/05 0649) Weight change: 0.544 kg (1 lb 3.2 oz)  Intake/Output from previous day: 02/04 0701 - 02/05 0700 In: 840 [P.O.:840] Out: 2100 [Urine:2100]  Intake/Output from this shift: Total I/O In: 0  Out: 525 [Urine:525]  Medications: No current facility-administered medications on file prior to encounter.    Current Outpatient Prescriptions on File Prior to Encounter  Medication Sig Dispense Refill  . escitalopram (LEXAPRO) 10 MG tablet TAKE 1 TABLET (10 MG TOTAL) BY MOUTH DAILY.  2  . LORazepam (ATIVAN) 1 MG tablet Take 0.5-1 mg by mouth 3 (three) times daily as needed for anxiety or sleep.   0  . Multiple Vitamins-Minerals (MULTIVITAMIN WITH MINERALS) tablet Take 1 tablet by mouth daily.      Physical Exam: General appearance: alert and no distress Neck: no carotid bruit and no JVD Lungs: clear to auscultation bilaterally Heart: regular rate and rhythm Abdomen: soft, non-tender; bowel sounds normal; no masses,  no organomegaly Extremities: extremities normal, atraumatic, no cyanosis or edema Pulses: 2+ and symmetric Skin: Skin color, texture, turgor normal. No rashes or lesions Neurologic: Grossly normal Psych: Pleasant, jovial  Lab Results: Results for orders placed or performed during the hospital encounter of 10/17/16 (from the past 48 hour(s))  Basic metabolic panel     Status: Abnormal   Collection Time: 10/20/16  4:29 AM  Result Value Ref Range   Sodium 139 135 - 145 mmol/L   Potassium 4.2 3.5 - 5.1 mmol/L   Chloride 98 (L) 101 - 111 mmol/L   CO2 33 (H) 22 - 32 mmol/L   Glucose, Bld 105 (H) 65 - 99 mg/dL   BUN 16 6 - 20 mg/dL   Creatinine, Ser 1.14 0.61 - 1.24 mg/dL   Calcium 9.3 8.9 - 10.3 mg/dL   GFR calc non Af Amer >60 >60 mL/min   GFR calc Af Amer >60 >60 mL/min    Comment: (NOTE) The eGFR has been calculated using the CKD EPI equation. This calculation has not been validated in all clinical situations. eGFR's persistently <60 mL/min signify possible Chronic Kidney Disease.    Anion gap 8 5 - 15  Basic metabolic panel     Status: Abnormal   Collection Time: 10/21/16  4:16 AM  Result Value Ref Range   Sodium 136 135 - 145 mmol/L   Potassium 4.4 3.5 - 5.1 mmol/L   Chloride 96 (L) 101 - 111 mmol/L   CO2 29 22 - 32 mmol/L   Glucose, Bld 104 (H) 65 - 99 mg/dL   BUN 14 6 - 20 mg/dL   Creatinine, Ser 1.07 0.61 - 1.24 mg/dL   Calcium 9.3 8.9 - 10.3 mg/dL   GFR calc non Af Amer >60 >60 mL/min   GFR calc Af Amer >60 >60 mL/min    Comment: (NOTE) The eGFR has been calculated using the CKD EPI equation. This calculation has not been validated in all clinical situations. eGFR's persistently <60 mL/min signify possible Chronic Kidney Disease.  Anion gap 11 5 - 15  CBC     Status: Abnormal   Collection Time: 10/21/16  4:16 AM  Result Value Ref Range   WBC 8.3 4.0 - 10.5 K/uL   RBC 4.40 4.22 - 5.81 MIL/uL   Hemoglobin 14.6 13.0 - 17.0 g/dL   HCT 45.7 39.0 - 52.0 %   MCV 103.9 (H) 78.0 - 100.0 fL   MCH 33.2 26.0 - 34.0 pg   MCHC 31.9 30.0 - 36.0 g/dL   RDW 14.0 11.5 - 15.5 %   Platelets 205 150 - 400 K/uL  Protime-INR     Status: None   Collection Time: 10/21/16  4:16 AM  Result Value Ref Range   Prothrombin Time 14.4 11.4 - 15.2 seconds   INR 1.11     Imaging: No results found.  Assessment:  Principal Problem:   Acute systolic congestive heart failure, NYHA class 4 (HCC) Active Problems:   Hyponatremia   AKI (acute kidney injury)  ([place])   Plan: Suspect heart failure is in the setting of alcohol-use vs. possbile multivessel CAD. Clinically improving with diuresis. -6.2L since admission and weight down by 8 lbs. On lasix 40 mg po daily. Echo showed LVEF 20-25%, global HK with regional abnormalities. Had a few runs of NSVT overnight, longest was 22 beats. Patient remained asymptomatic. BP normal. On lisinopril, SCr improved from 1.4 yesterday to 1.0 today. Scheduled for L/RHC today.  -Continue Lasix 40 mg QD -Increase dose of Carvedilol ---- -Continue lisinopril   Shela Leff 10/21/2016, 9:06 AM   I have personally seen and examined this patient with Dr. Marlowe Sax. I agree with the assessment and plan as outlined above. Admitted with CHF. LVEF=20-25%. He has diuresed well. Weight down 8 lbs. Pt is scheduled for right and left cardiac cath today.   Lauree Chandler 10/21/2016 10:55 AM

## 2016-10-21 NOTE — Progress Notes (Signed)
Patients 7 Fr right femoral veinous sheath was removed @1620 . Manual pressure was held for 20 minutes. Patients VS remained WNL's during sheath pull. Patient had no complaints during sheath pull. Vascular site assessment was a level: 0. Bedrest began @ 1640 and ends at 2040. Sheath removal site was covered with a transparent gauze dressing. Dressing site is clean, dry, and intact. Patient was educated on bedrest and how to properly hold pressure on the site if needed. Will continue to monitor patient and will give report to patients nurse on 3E.

## 2016-10-21 NOTE — Progress Notes (Signed)
Pt refused bed alarm on. Will continue to do hourly rounding. 

## 2016-10-21 NOTE — Progress Notes (Signed)
Received patient post Cath, alert and oriented. Vascular site assessment was  level 0,dressing clean dry and intact. Will monitor accordingly.

## 2016-10-21 NOTE — Discharge Instructions (Signed)
Heart Disease Prevention °Heart disease is a leading cause of death. There are many things you can do to help prevent heart disease. °Be physically active °Physical activity is good for your heart. It helps control your blood pressure, cholesterol levels, and weight. Try to be physically active every day. Ask your health care provider what activities are best for you. °Be a healthy weight °Extra weight can strain your heart and affect your blood pressure and cholesterol levels. Lose weight with diet and exercise if recommended by your health care provider. °Eat heart-healthy foods °Follow a healthy eating plan as recommended by your health care provider or dietitian. Heart-healthy foods include: °· High-fiber foods. These include oat bran, oatmeal, and whole-grain breads and cereals. °· Fruits and vegetables. °Avoid: °· Alcohol. °· Fried foods. °· Foods high in saturated fat. These include meats, butter, whole dairy products, shortening, and coconut or palm oil. °· Salty foods. These include canned food, luncheon meat, salty snacks, and fast food. °Keep your cholesterol levels under control °Cholesterol is a substance that is used for many important functions. When your cholesterol levels are high, cholesterol can stick to the insides of your blood vessels, making them narrow or clog. This can lead to chest pain (angina) and a heart attack. °Keep your cholesterol levels under control as recommended by your health care provider. Have your cholesterol checked at least once a year. Target cholesterol levels (in mg/dL) for most people are: °· Total cholesterol below 200. °· LDL cholesterol below 100. °· HDL cholesterol above 40 in men and above 50 in women. °· Triglycerides below 150. °Keep your blood pressure under control °Having high blood pressure (hypertension) puts you at risk for stroke and other forms of heart disease. Keep your blood pressure under control as recommended by your health care provider. Ask your  health care provider if you need treatment to lower your blood pressure. If you are 18-39 years of age, have your blood pressure checked every 3-5 years. If you are 40 years of age or older, have your blood pressure checked every year. °Do not use tobacco products °Tobacco smoke can damage your heart and blood vessels. Do not use any tobacco products including cigarettes, chewing tobacco, or electronic cigarettes. If you need help quitting, ask your health care provider. °Take medicines as directed °Take medicines only as directed by your health care provider. Ask your health care provider whether you should take an aspirin every day. Taking aspirin can help reduce your risk of heart disease and stroke. °Where to find more information: °To find out more about heart disease, visit the American Heart Association's website at www.americanheart.org °This information is not intended to replace advice given to you by your health care provider. Make sure you discuss any questions you have with your health care provider. °Document Released: 04/16/2004 Document Revised: 01/31/2016 Document Reviewed: 10/27/2013 °Elsevier Interactive Patient Education © 2017 [employer] ° °

## 2016-10-22 ENCOUNTER — Encounter (HOSPITAL_COMMUNITY): Payer: Self-pay | Admitting: Cardiovascular Disease

## 2016-10-22 ENCOUNTER — Encounter: Payer: Self-pay | Admitting: Physician Assistant

## 2016-10-22 DIAGNOSIS — I428 Other cardiomyopathies: Secondary | ICD-10-CM

## 2016-10-22 LAB — CBC
HCT: 49.4 % (ref 39.0–52.0)
Hemoglobin: 15.6 g/dL (ref 13.0–17.0)
MCH: 33 pg (ref 26.0–34.0)
MCHC: 31.6 g/dL (ref 30.0–36.0)
MCV: 104.4 fL — AB (ref 78.0–100.0)
PLATELETS: 207 10*3/uL (ref 150–400)
RBC: 4.73 MIL/uL (ref 4.22–5.81)
RDW: 14 % (ref 11.5–15.5)
WBC: 8 10*3/uL (ref 4.0–10.5)

## 2016-10-22 LAB — BASIC METABOLIC PANEL
Anion gap: 11 (ref 5–15)
BUN: 13 mg/dL (ref 6–20)
CO2: 27 mmol/L (ref 22–32)
CREATININE: 1.05 mg/dL (ref 0.61–1.24)
Calcium: 9.3 mg/dL (ref 8.9–10.3)
Chloride: 99 mmol/L — ABNORMAL LOW (ref 101–111)
GFR calc Af Amer: >60 mL/min (ref >60)
GLUCOSE: 124 mg/dL — AB (ref 65–99)
Potassium: 4.4 mmol/L (ref 3.5–5.1)
SODIUM: 137 mmol/L (ref 135–145)

## 2016-10-22 MED ORDER — CARVEDILOL 12.5 MG PO TABS: 12.5000 mg | ORAL_TABLET | Freq: Two times a day (BID) | ORAL | Status: DC

## 2016-10-22 MED ORDER — ASPIRIN 81 MG PO CHEW: 81.0000 mg | CHEWABLE_TABLET | Freq: Every day | ORAL | Status: DC

## 2016-10-22 MED ORDER — FUROSEMIDE 40 MG PO TABS: 40.0000 mg | ORAL_TABLET | Freq: Every day | ORAL | Status: DC

## 2016-10-22 NOTE — Progress Notes (Signed)
DAILY PROGRESS NOTE  Subjective:  Patient reports feeling well and has no complaints. Denies having any CP, SOB, palpitations, or diaphoresis. States his LE edema has resolved. No other complaints.    Objective:  Temp:  [97.8 F (36.6 C)-99 F (37.2 C)] 98.2 F (36.8 C) (02/06 0510) Pulse Rate:  [0-94] 77 (02/06 0510) Resp:  [0-28] 18 (02/06 0510) BP: (112-141)/(75-103) 124/92 (02/06 0510) SpO2:  [0 %-100 %] 100 % (02/06 0510) Weight:  [101.2 kg (223 lb)] 101.2 kg (223 lb) (02/06 0510) Weight change: -2.767 kg (-6 lb 1.6 oz)  Intake/Output from previous day: 02/05 0701 - 02/06 0700 In: 480 [P.O.:480] Out: 3825 [Urine:3825]  Intake/Output from this shift: No intake/output data recorded.  Medications: No current facility-administered medications on file prior to encounter.    Current Outpatient Prescriptions on File Prior to Encounter  Medication Sig Dispense Refill  . escitalopram (LEXAPRO) 10 MG tablet TAKE 1 TABLET (10 MG TOTAL) BY MOUTH DAILY.  2  . Multiple Vitamins-Minerals (MULTIVITAMIN WITH MINERALS) tablet Take 1 tablet by mouth daily.      Physical Exam: General appearance: alert and no distress Neck: no carotid bruit and no JVD Lungs: clear to auscultation bilaterally Heart: regular rate and rhythm Abdomen: soft, non-tender; bowel sounds normal; no masses,  no organomegaly Extremities: extremities normal, atraumatic, no cyanosis or edema Pulses: 2+ and symmetric Skin: Skin color, texture, turgor normal. No rashes or lesions Neurologic: Grossly normal Psych: Pleasant, jovial  Lab Results: Results for orders placed or performed during the hospital encounter of 10/17/16 (from the past 48 hour(s))  Basic metabolic panel     Status: Abnormal   Collection Time: 10/21/16  4:16 AM  Result Value Ref Range   Sodium 136 135 - 145 mmol/L   Potassium 4.4 3.5 - 5.1 mmol/L   Chloride 96 (L) 101 - 111 mmol/L   CO2 29 22 - 32 mmol/L   Glucose, Bld 104 (H) 65 - 99  mg/dL   BUN 14 6 - 20 mg/dL   Creatinine, Ser 1.07 0.61 - 1.24 mg/dL   Calcium 9.3 8.9 - 10.3 mg/dL   GFR calc non Af Amer >60 >60 mL/min   GFR calc Af Amer >60 >60 mL/min    Comment: (NOTE) The eGFR has been calculated using the CKD EPI equation. This calculation has not been validated in all clinical situations. eGFR's persistently <60 mL/min signify possible Chronic Kidney Disease.    Anion gap 11 5 - 15  CBC     Status: Abnormal   Collection Time: 10/21/16  4:16 AM  Result Value Ref Range   WBC 8.3 4.0 - 10.5 K/uL   RBC 4.40 4.22 - 5.81 MIL/uL   Hemoglobin 14.6 13.0 - 17.0 g/dL   HCT 45.7 39.0 - 52.0 %   MCV 103.9 (H) 78.0 - 100.0 fL   MCH 33.2 26.0 - 34.0 pg   MCHC 31.9 30.0 - 36.0 g/dL   RDW 14.0 11.5 - 15.5 %   Platelets 205 150 - 400 K/uL  Protime-INR     Status: None   Collection Time: 10/21/16  4:16 AM  Result Value Ref Range   Prothrombin Time 14.4 11.4 - 15.2 seconds   INR 1.11   I-STAT 3, venous blood gas (G3P V)     Status: Abnormal   Collection Time: 10/21/16  3:35 PM  Result Value Ref Range   pH, Ven 7.406 7.250 - 7.430   pCO2, Ven 46.7 44.0 - 60.0 mmHg  pO2, Ven 32.0 32.0 - 45.0 mmHg   Bicarbonate 29.3 (H) 20.0 - 28.0 mmol/L   TCO2 31 0 - 100 mmol/L   O2 Saturation 61.0 %   Acid-Base Excess 4.0 (H) 0.0 - 2.0 mmol/L   Patient temperature HIDE    Sample type VENOUS   I-STAT 3, arterial blood gas (G3+)     Status: Abnormal   Collection Time: 10/21/16  3:35 PM  Result Value Ref Range   pH, Arterial 7.427 7.350 - 7.450   pCO2 arterial 41.7 32.0 - 48.0 mmHg   pO2, Arterial 133.0 (H) 83.0 - 108.0 mmHg   Bicarbonate 27.5 20.0 - 28.0 mmol/L   TCO2 29 0 - 100 mmol/L   O2 Saturation 99.0 %   Acid-Base Excess 3.0 (H) 0.0 - 2.0 mmol/L   Patient temperature HIDE    Sample type ARTERIAL     Imaging: No results found.  Assessment:  Principal Problem:   Acute systolic congestive heart failure, NYHA class 4 (HCC) Active Problems:   Hyponatremia   AKI  (acute kidney injury) ([place])   Plan:  Acute combined heart failure: Suspect heart failure is in the setting of past heavy alcohol use vs viral etiology. R/ LHC done 10/21/16 showing severe LV systolic dysfunction with EF < 25% and severely elevated LV EDP. No vessel disease seen. Clinically improving with diuresis. -9.0 L since admission and weight down by 14 lbs. On lasix 40 mg po daily.  Had a 6 beat run of NSVT overnight. Patient remained asymptomatic. BP normal.   -Continue Lasix 40 mg QD -Continue Carvedilol 6.25 mg BID -Continue lisinopril 5 mg daily   Vasundhra Rathore 10/22/2016, 8:45 AM    I have personally seen and examined this patient with Dr. Marlowe Sax. I agree with the assessment and plan as outlined above. He is doing well this am. NO dyspnea. He has a NICM. Coronaries ok by cath yesterday. Medical management of his cardiomyopathy with Coreg and Lisinopril He has diuresed well with IV Lasix. Will stop IV Lasix and start Lasix 40 mg daily. Will need repeat echo in 3 months.  He can be discharged home today. We will arrange f/u with Dr. Ellyn Hack in 1-2 weeks.   Lauree Chandler 10/22/2016 11:12 AM

## 2016-10-22 NOTE — Progress Notes (Signed)
Orders received for pt discharge.  Discharge summary printed and reviewed with pt.  Explained medication regimen, and pt had no further questions at this time.  IV removed and site remains clean, dry, intact.  Telemetry removed.  Pt in stable condition and awaiting transport. 

## 2016-10-22 NOTE — Discharge Summary (Signed)
Physician Discharge Summary  John [name] TMH:[id number] DOB: 05/12/1961 DOA: 10/17/2016  PCP: Margaree Mackintosh, MD  Admit date: 10/17/2016 Discharge date: 10/22/2016  Recommendations for Outpatient Follow-up:  1. Pt will need to follow up with PCP in 1-2 weeks post discharge 2. Please obtain BMP to evaluate electrolytes and kidney function 3. Please also check CBC to evaluate Hg and Hct levels 4. Please note addition of Lasix, Lisinopril, Coreg to pt's medical list  Discharge Diagnoses:  Principal Problem:   Acute systolic congestive heart failure, NYHA class 4 (HCC) Active Problems:   Hyponatremia   AKI (acute kidney injury) (HCC)    Discharge Condition: Stable  Diet recommendation: Heart healthy diet discussed in details   Brief Narrative:  56 y.o.malewith a past medical history significant for anxiety/depressionwho presented with few weeks worsening SOB with exertion and progressive leg swelling, weight gain, pulmonary edema on CXR and elevated BNP.  Assessment & Plan:   Principal Problem:   Acute respiratory failure due to Acute systolic congestive heart failure, NYHA class 4 (HCC) - EF 25% on ECHO, cath done and confirms reduced EF - continue Lasix, Coreg, lisinopril  - weight trend since admission:       Filed Weights   10/19/16 0603 10/20/16 0511 10/21/16 0649  Weight: 104.8 kg (231 lb) 103.4 kg (227 lb 14.4 oz) 103.9 kg (229 lb 1.6 oz)   Active Problems:   Hyponatremia - in the setting of volume overload - resolved    AKI (acute kidney injury) (HCC) - in the setting of acute systolic CHF - resolved    Obesity  - Body mass index is 31.95 kg/m.  DVT prophylaxis: Lovenox SQ Code Status: Full  Family Communication: Patient at bedside  Disposition Plan: Home   Consultants:   Cardiology   Procedures:   None   Antimicrobials:   None   Procedures/Studies: Dg Chest 2 View  Result Date: 10/18/2016 CLINICAL DATA:  Subacute onset of  shortness of breath and dyspnea on exertion. Lower extremity edema. Initial encounter. EXAM: CHEST  2 VIEW COMPARISON:  Chest radiograph performed earlier today at 4:13 p.m. FINDINGS: The lungs are well-aerated. Mild vascular congestion is noted. Persistent right basilar opacity may reflect mild pneumonia. There is no evidence of pleural effusion or pneumothorax. The heart is normal in size; the mediastinal contour is within normal limits. No acute osseous abnormalities are seen. Chronic right-sided rib deformities are noted. The patient's right shoulder arthroplasty is incompletely imaged on this study. IMPRESSION: Mild vascular congestion noted. Persistent right basilar opacity raises concern for mild pneumonia. Electronically Signed   By: Roanna Raider M.D.   On: 10/18/2016 00:14     Discharge Exam: Vitals:   10/22/16 0510 10/22/16 0913  BP: (!) 124/92 112/82  Pulse: 77 75  Resp: 18 20  Temp: 98.2 F (36.8 C) 97.8 F (36.6 C)   Vitals:   10/21/16 2025 10/22/16 0003 10/22/16 0510 10/22/16 0913  BP: 116/76 122/83 (!) 124/92 112/82  Pulse: 72 73 77 75  Resp: 18 18 18 20   Temp: 99 F (37.2 C) 97.8 F (36.6 C) 98.2 F (36.8 C) 97.8 F (36.6 C)  TempSrc: Oral Oral Oral Oral  SpO2: 97% 99% 100% 100%  Weight:   101.2 kg (223 lb)   Height:        General: Pt is alert, follows commands appropriately, not in acute distress Cardiovascular: Regular rate and rhythm, S1/S2 +, no murmurs, no rubs, no gallops Respiratory: Clear to auscultation bilaterally, no  wheezing, no crackles, no rhonchi Abdominal: Soft, non tender, non distended, bowel sounds +, no guarding  Discharge Instructions  Discharge Instructions    Diet - low sodium heart healthy    Complete by:  As directed    Increase activity slowly    Complete by:  As directed      Allergies as of 10/22/2016   No Known Allergies     Medication List    TAKE these medications   aspirin 81 MG chewable tablet Chew 1 tablet (81 mg  total) by mouth daily. Start taking on:  10/23/2016   carvedilol 6.25 MG tablet Commonly known as:  COREG Take 1 tablet (6.25 mg total) by mouth 2 (two) times daily with a meal.   escitalopram 10 MG tablet Commonly known as:  LEXAPRO TAKE 1 TABLET (10 MG TOTAL) BY MOUTH DAILY.   fluticasone 50 MCG/ACT nasal spray Commonly known as:  FLONASE Place 2 sprays into both nostrils daily.   furosemide 40 MG tablet Commonly known as:  LASIX Take 1 tablet (40 mg total) by mouth daily.   lisinopril 5 MG tablet Commonly known as:  PRINIVIL,ZESTRIL Take 1 tablet (5 mg total) by mouth daily.   LORazepam 1 MG tablet Commonly known as:  ATIVAN Take 0.5-1 tablets (0.5-1 mg total) by mouth 3 (three) times daily as needed for anxiety or sleep.   multivitamin with minerals tablet Take 1 tablet by mouth daily.   potassium chloride SA 20 MEQ tablet Commonly known as:  K-DUR,KLOR-CON Take 1 tablet (20 mEq total) by mouth daily.      Follow-up Information    Margaree Mackintosh, MD Follow up.   Specialty:  Internal Medicine Contact information: [address] [place] [address] [phone]        Debbora Presto, MD Follow up.   Specialty:  Internal Medicine Why:  call my cell phone with any questions [phone] Contact information: [address] [place] [address] [phone]            The results of significant diagnostics from this hospitalization (including imaging, microbiology, ancillary and laboratory) are listed below for reference.     Microbiology: No results found for this or any previous visit (from the past 240 hour(s)).   Labs: Basic Metabolic Panel:  Recent Labs Lab 10/17/16 2013 10/18/16 1109 10/19/16 0207 10/20/16 0429 10/21/16 0416  NA 132* 139 139 139 136  K 4.1 3.5 3.7 4.2 4.4  CL 101 102 101 98* 96*  CO2 20* 25 27 33* 29  GLUCOSE 101* 115* 117* 105* 104*  BUN 17 18 14 16 14   CREATININE 1.27*  1.14 0.99 1.14 1.07  CALCIUM 9.1 9.1 8.8* 9.3 9.3  MG  --  2.1 2.0  --   --    CBC:  Recent Labs Lab 10/17/16 2013 10/19/16 0207 10/21/16 0416  WBC 10.5 9.2 8.3  HGB 14.9 14.6 14.6  HCT 46.6 44.9 45.7  MCV 101.7* 101.8* 103.9*  PLT 220 204 205   Cardiac Enzymes:  Recent Labs Lab 10/18/16 1109 10/18/16 1430 10/18/16 2035 10/19/16 0207  TROPONINI 0.10* 0.10* 0.12* 0.09*   SIGNED: Time coordinating discharge: 30 minutes  Debbora Presto, MD  [facility] 10/22/2016, 9:47 AM Pager [phone]  If 7PM-7AM, please contact night-coverage [email or web address] Password [email or web address]

## 2016-10-22 NOTE — Progress Notes (Signed)
Dr. Clifton James requested arrangement of f/u in 1-2 weeks with Dr. Herbie Baltimore. Pt has been discharged. Sent message to NL Scheduler to help arrange TOC Visit during this timeframe. Azrael Maddix PA-C

## 2016-10-22 NOTE — Progress Notes (Signed)
Pt resting in bed, placed bandaid to right femoral cath site, no bleeding noted.

## 2016-10-25 ENCOUNTER — Telehealth: Payer: Self-pay | Admitting: Cardiology

## 2016-10-25 NOTE — Telephone Encounter (Signed)
TOC attempt #1-no answer, lmtcb. 

## 2016-10-25 NOTE — Telephone Encounter (Signed)
Follow up ° ° ° ° ° °Returning a call to the nurse °

## 2016-10-25 NOTE — Telephone Encounter (Signed)
Attempt to return call-no answer, lmtcb. 

## 2016-10-25 NOTE — Telephone Encounter (Signed)
TOC Phone Call .Marland Kitchen Appt is on 11/08/16 at 9:30 w/ Randall An

## 2016-10-25 NOTE — Telephone Encounter (Signed)
Patient contacted regarding discharge from [facility] on 10/22/16.    Patient understands to follow up with provider Randall An PA on 11/08/16 at 9:30AM at [facility] office.  Patient understands discharge instructions? yes  Patient understands medications and regiment? yes  Patient understands to bring all medications to this visit? yes    Patient request I call back and leave VM with appointment information: called and left voicemail with appointment information and address.  Advised to call with further questions or concerns.

## 2016-11-08 ENCOUNTER — Ambulatory Visit (INDEPENDENT_AMBULATORY_CARE_PROVIDER_SITE_OTHER): Payer: Self-pay | Admitting: Student

## 2016-11-08 ENCOUNTER — Encounter: Payer: Self-pay | Admitting: Student

## 2016-11-08 VITALS — BP 118/83 | HR 94 | Ht 71.0 in | Wt 218.0 lb

## 2016-11-08 DIAGNOSIS — I5021 Acute systolic (congestive) heart failure: Secondary | ICD-10-CM

## 2016-11-08 DIAGNOSIS — I428 Other cardiomyopathies: Secondary | ICD-10-CM

## 2016-11-08 DIAGNOSIS — I10 Essential (primary) hypertension: Secondary | ICD-10-CM | POA: Insufficient documentation

## 2016-11-08 DIAGNOSIS — N179 Acute kidney failure, unspecified: Secondary | ICD-10-CM

## 2016-11-08 MED ORDER — CARVEDILOL 6.25 MG PO TABS: 6.2500 mg | ORAL_TABLET | Freq: Two times a day (BID) | ORAL | 3 refills | Status: DC

## 2016-11-08 MED ORDER — FUROSEMIDE 40 MG PO TABS: 40.0000 mg | ORAL_TABLET | Freq: Every day | ORAL | 3 refills | Status: DC

## 2016-11-08 MED ORDER — LISINOPRIL 5 MG PO TABS: 5.0000 mg | ORAL_TABLET | Freq: Every day | ORAL | 3 refills | Status: DC

## 2016-11-08 MED ORDER — POTASSIUM CHLORIDE CRYS ER 20 MEQ PO TBCR: 20.0000 meq | EXTENDED_RELEASE_TABLET | Freq: Every day | ORAL | 3 refills | Status: DC

## 2016-11-08 NOTE — Patient Instructions (Signed)
Medication Instructions:   REFILLS HAVE BEEN SENT TO THE PHARMACY ELECTRONICALLY  NO CHANGE  Labwork:  Your physician recommends that you HAVE LAB WORK TODAY  Testing/Procedures:  Your physician has requested that you have an echocardiogram. Echocardiography is a painless test that uses sound waves to create images of your heart. It provides your doctor with information about the size and shape of your heart and how well your heart's chambers and valves are working. This procedure takes approximately one hour. There are no restrictions for this procedure. SCHEDULE IN 3 MONTHS    Follow-Up:  Your physician recommends that you schedule a follow-up appointment in: WITH DR BERRY AFTER ECHO COMPLETE   If you need a refill on your cardiac medications before your next appointment, please call your pharmacy.

## 2016-11-08 NOTE — Progress Notes (Signed)
Cardiology Office Note    Date:  11/08/2016   ID:  John [name], DOB 01-25-1961, MRN [id number]  PCP:  Margaree Mackintosh, MD  Cardiologist: Wishes to follow with Dr. Allyson Sabal    Chief Complaint  Patient presents with  . Hospitalization Follow-up    History of Present Illness:    John [name] is a 56 y.o. male with past medical history of anxiety/depression who presents to the office today for hospital follow-up.  Was recently admitted from 10/18/2016 - 10/22/2016 for evaluation of worsening dyspnea with exertion and lower extremity edema. CXR showed mild vascular congestion with persistent right basilar opacity. Cyclic troponin values peaked at 0.12. An echocardiogram was obtained which showed an EF of 20-25% with diffuse HK and akinesis of the inferoseptal myocardium. Was diuresed with improvement in his creatinine. A cardiac catheterization was performed which showed normal cors with an EF of 25% and elevated end diastolic pressure. 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 Was started on PO Lasx 40mg  daily along with Coreg 6.25mg  BID and Lisinopril 5mg  daily. Weight at time of discharge was 229 lbs with stable creatinine of 1.05.  In talking with the patient today, he reports doing well since recent hospitalization. Denies any repeat episodes of dyspnea with exertion, orthopnea, or lower extremity edema. Reports weights have been stable on his home scales.  No recent chest discomfort or palpitations.  He works as a Medical illustrator and consumes dinner at [employer] regularly. Says he is trying to consume a low-sodium diet but this is difficult due to his work requirements. He does not add extra salt to his food.   He was consuming 4 beers per night/7 days per week secondary to his hospitalization. He has cut this down to 2 beers nightly.    Past Medical History:  Diagnosis Date  . Anxiety    Ativan  . Chronic combined systolic  and diastolic CHF, NYHA class 3 (HCC) 10/2016   Nonischemic cardiomyopathy. EF 20-25%.  . Chronic left hip pain   . Depression    history of  . GI bleed 03/2016  . Headache   . Hypertensive heart disease with combined systolic and diastolic congestive heart failure (HCC) 10/2016  . Nonischemic cardiomyopathy (HCC) 10/2016   Echo with EF 20-25%. Cardiac catheterization with no CAD. LVEDP was 41 mmHg, PCWP 36 mmHg  . Osteoarthritis    right shoulder  . Right hand fracture   . Seasonal allergies   . Sleep apnea    wears a CPAP  . Wears glasses     Past Surgical History:  Procedure Laterality Date  . ESOPHAGOGASTRODUODENOSCOPY (EGD) WITH PROPOFOL N/A 03/20/2016   Procedure: ESOPHAGOGASTRODUODENOSCOPY (EGD) WITH PROPOFOL;  Surgeon: Charlott Rakes, MD;  Location: [facility] ENDOSCOPY;  Service: Endoscopy;  Laterality: N/A;  . RIGHT/LEFT HEART CATH AND CORONARY ANGIOGRAPHY N/A 10/21/2016   Procedure: Right/Left Heart Cath and Coronary Angiography;  Surgeon: Runell Gess, MD;  Location: [facility] INVASIVE CV LAB;  Service: Cardiovascular: Angiographically normal coronary arteries. PCWP 33-36 mmHg, LVEDP 41 mmHg. PA pressure 60/35, mean 46 mmHg.  Cardiac output/cardiac index-3.93 /1.76 (Fick), 3.49/1.57 (thermodilution)  . TOTAL HIP ARTHROPLASTY Left 03/27/2015   Procedure: LEFT TOTAL HIP ARTHROPLASTY ANTERIOR APPROACH;  Surgeon: Samson Frederic, MD;  Location: [facility] OR;  Service: Orthopedics;  Laterality: Left;  . TOTAL SHOULDER ARTHROPLASTY Right 11/24/2015   Procedure: RIGHT TOTAL SHOULDER ARTHROPLASTY;  Surgeon: Beverely Low, MD;  Location: [facility] OR;  Service: Orthopedics;  Laterality:  Right;  Marland Kitchen TRANSTHORACIC ECHOCARDIOGRAM  10/2016   EF 20-25%. Diffuse hypokinesis but akinesis of the entire inferoseptal wall and apical wall. Moderate biatrial enlargement. PA pressure estimated 64 mmHg.  . WISDOM TOOTH EXTRACTION      Current Medications: Outpatient Medications Prior to Visit  Medication Sig Dispense Refill    . aspirin 81 MG chewable tablet Chew 1 tablet (81 mg total) by mouth daily.    Marland Kitchen escitalopram (LEXAPRO) 10 MG tablet TAKE 1 TABLET (10 MG TOTAL) BY MOUTH DAILY.  2  . fluticasone (FLONASE) 50 MCG/ACT nasal spray Place 2 sprays into both nostrils daily. 16 g 3  . LORazepam (ATIVAN) 1 MG tablet Take 0.5-1 tablets (0.5-1 mg total) by mouth 3 (three) times daily as needed for anxiety or sleep. 30 tablet 0  . Multiple Vitamins-Minerals (MULTIVITAMIN WITH MINERALS) tablet Take 1 tablet by mouth daily.    . carvedilol (COREG) 6.25 MG tablet Take 1 tablet (6.25 mg total) by mouth 2 (two) times daily with a meal. 60 tablet 1  . furosemide (LASIX) 40 MG tablet Take 1 tablet (40 mg total) by mouth daily. 30 tablet 1  . lisinopril (PRINIVIL,ZESTRIL) 5 MG tablet Take 1 tablet (5 mg total) by mouth daily. 30 tablet 1  . potassium chloride SA (K-DUR,KLOR-CON) 20 MEQ tablet Take 1 tablet (20 mEq total) by mouth daily. 30 tablet 1   No facility-administered medications prior to visit.      Allergies:   Patient has no known allergies.   Social History   Social History  . Marital status: Divorced    Spouse name: N/A  . Number of children: N/A  . Years of education: N/A   Social History Main Topics  . Smoking status: Never Smoker  . Smokeless tobacco: Never Used  . Alcohol use 1.8 oz/week    3 Cans of beer per week     Comment: occasional  . Drug use: No  . Sexual activity: Not Asked   Other Topics Concern  . None   Social History Narrative  . None     Family History:  The patient's family history includes Congenital heart disease in his sister.   Review of Systems:   Please see the history of present illness.     General:  No chills, fever, night sweats or weight changes. Positive for generalized fatigue.  Cardiovascular:  No chest pain, dyspnea on exertion, orthopnea, palpitations, paroxysmal nocturnal dyspnea. Positive for lower extremity edema.  Dermatological: No rash,  lesions/masses Respiratory: No cough, dyspnea Urologic: No hematuria, dysuria Abdominal:   No nausea, vomiting, diarrhea, bright red blood per rectum, melena, or hematemesis Neurologic:  No visual changes, wkns, changes in mental status. All other systems reviewed and are otherwise negative except as noted above.   Physical Exam:    VS:  BP 118/83   Pulse 94   Ht 5\' 11"  (1.803 m)   Wt 218 lb (98.9 kg)   BMI 30.40 kg/m    General: Well developed, well nourished Caucasian male appearing in no acute distress. Head: Normocephalic, atraumatic, sclera non-icteric, no xanthomas, nares are without discharge.  Neck: No carotid bruits. JVD not elevated.  Lungs: Respirations regular and unlabored, without wheezes or rales.  Heart: Regular rate and rhythm. No S3 or S4.  No murmur, no rubs, or gallops appreciated. Abdomen: Soft, non-tender, non-distended with normoactive bowel sounds. No hepatomegaly. No rebound/guarding. No obvious abdominal masses. Msk:  Strength and tone appear normal for age. No joint deformities or  effusions. Extremities: No clubbing or cyanosis. No edema.  Distal pedal pulses are 2+ bilaterally. Neuro: Alert and oriented X 3. Moves all extremities spontaneously. No focal deficits noted. Psych:  Responds to questions appropriately with a normal affect. Skin: No rashes or lesions noted  Wt Readings from Last 3 Encounters:  11/08/16 218 lb (98.9 kg)  10/22/16 223 lb (101.2 kg)  03/19/16 215 lb (97.5 kg)     Studies/Labs Reviewed:   EKG:  EKG is not ordered today.   Recent Labs: 03/19/2016: ALT 19 10/17/2016: TSH 3.501 10/19/2016: Magnesium 2.0 10/22/2016: BUN 13; Creatinine, Ser 1.05; Hemoglobin 15.6; Platelets 207; Potassium 4.4; Sodium 137   Lipid Panel No results found for: CHOL, TRIG, HDL, CHOLHDL, VLDL, LDLCALC, LDLDIRECT  Additional studies/ records that were reviewed today include:   Echocardiogram: 10/18/2016 Study Conclusions  - Left ventricle: The cavity  size was mildly dilated. There was   mild concentric hypertrophy. Systolic function was severely   reduced. The estimated ejection fraction was in the range of 20%   to 25%. Severe diffuse hypokinesis with regional variations.   There is akinesis of the entireapical myocardium. There is   akinesis of the entireinferoseptal myocardium. - Mitral valve: Calcified annulus. Mild focal calcification of the   anterior leaflet (medial segment(s)) and posterior leaflet   (medial scallop(s)). There was mild regurgitation. - Left atrium: The atrium was moderately dilated. - Right ventricle: The cavity size was mildly dilated. Wall   thickness was normal. - Right atrium: The atrium was moderately dilated. - Pulmonary arteries: PA peak pressure: 64 mm Hg (S).  Impressions:  - The right ventricular systolic pressure was increased consistent   with moderate pulmonary hypertension.  Cardiac Catheterization: 10/21/2016  There is severe left ventricular systolic dysfunction.  LV end diastolic pressure is severely elevated.  The left ventricular ejection fraction is less than 25% by visual estimate.   Mr. Moeller has normal coronary arteries and severe LV dysfunction with elevated filling pressures. He has a nonischemic cardiomyopathy. Aggressive medical therapy will be recommended. His right common femoral arterial puncture site was sealed with a "MYNX device". His attending, Dr. Sanjuana Kava , is aware of the results and has written for IV Lasix. He left the lab in stable condition.   Assessment:    1. NICM (nonischemic cardiomyopathy) (HCC)   2. Acute systolic congestive heart failure (HCC)   3. Essential hypertension   4. AKI (acute kidney injury) (HCC)      Plan:   In order of problems listed above:  1. Nonischemic Cardiomyopathy/ Acute Systolic CHF - recently admitted for worsening dyspnea with exertion and lower extremity edema. Echo showed an EF of 20-25% with diffuse HK and akinesis  of the inferoseptal myocardium. Cath showed normal cors with an EF of 25% and elevated end diastolic pressure. - cardiomyopathy thought to be of viral vs. alcohol etiology. Was consuming 4 beers daily prior to admission, has decreased this to 2 beers daily. Continued reduction and cessation was advised.  - denies any orthopnea, PND, or lower extremity edema. Weight down from 229 lbs at hospital discharge to 218 lbs today. Says weight has varied on home scales.  - will continue Lasx 40mg  daily along with Coreg 6.25mg  BID and Lisinopril 5mg  daily. Will not further titrate BB at this time as he reports significant fatigue since starting these. Was instructed to check daily weights and take an additional Lasix tablet for weight gain of > 3 lbs overnight or > 5 lbs in  one week.  - will obtain a repeat echocardiogram in 3 months to reassess EF.  2. Essential HTN - BP at 118/83 during today's visit. - continue Coreg 6.25mg  BID and Lisinopril 5mg  daily.   3. AKI - creatinine peaked at 1.27 during recent hospitalization, at 1.05 on 10/22/2016. - repeat BMET today.    Medication Adjustments/Labs and Tests Ordered: Current medicines are reviewed at length with the patient today.  Concerns regarding medicines are outlined above.  Medication changes, Labs and Tests ordered today are listed in the Patient Instructions below. Patient Instructions  Medication Instructions:   REFILLS HAVE BEEN SENT TO THE PHARMACY ELECTRONICALLY  NO CHANGE  Labwork:  Your physician recommends that you HAVE LAB WORK TODAY  Testing/Procedures:  Your physician has requested that you have an echocardiogram. Echocardiography is a painless test that uses sound waves to create images of your heart. It provides your doctor with information about the size and shape of your heart and how well your heart's chambers and valves are working. This procedure takes approximately one hour. There are no restrictions for this procedure.  SCHEDULE IN 3 MONTHS   Follow-Up: Your physician recommends that you schedule a follow-up appointment in: WITH DR BERRY AFTER ECHO COMPLETE   If you need a refill on your cardiac medications before your next appointment, please call your pharmacy.       Lorri Frederick, [place]  11/08/2016 3:56 PM    [facility] [address] [place], [address] [place], [place]  [address] Phone: [phone]; Fax: [phone]  [address], [address] [place], [place] [address] Phone: [phone]

## 2017-01-02 DIAGNOSIS — E6609 Other obesity due to excess calories: Secondary | ICD-10-CM | POA: Insufficient documentation

## 2017-02-07 ENCOUNTER — Other Ambulatory Visit (HOSPITAL_COMMUNITY): Payer: Self-pay

## 2017-02-11 ENCOUNTER — Ambulatory Visit: Payer: Self-pay | Admitting: Cardiovascular Disease

## 2017-02-12 ENCOUNTER — Encounter: Payer: Self-pay | Admitting: *Deleted

## 2017-02-19 ENCOUNTER — Ambulatory Visit (HOSPITAL_COMMUNITY): Payer: 59 | Attending: Cardiology

## 2017-02-19 DIAGNOSIS — I509 Heart failure, unspecified: Secondary | ICD-10-CM | POA: Insufficient documentation

## 2017-02-19 DIAGNOSIS — I428 Other cardiomyopathies: Secondary | ICD-10-CM

## 2017-02-19 DIAGNOSIS — D649 Anemia, unspecified: Secondary | ICD-10-CM | POA: Insufficient documentation

## 2017-02-19 DIAGNOSIS — I340 Nonrheumatic mitral (valve) insufficiency: Secondary | ICD-10-CM | POA: Diagnosis not present

## 2017-02-19 DIAGNOSIS — I110 Hypertensive heart disease with heart failure: Secondary | ICD-10-CM | POA: Diagnosis not present

## 2017-02-19 MED ORDER — PERFLUTREN LIPID MICROSPHERE
1.0000 mL | INTRAVENOUS | Status: AC | PRN
  Administered 2017-02-19: 2 mL via INTRAVENOUS

## 2017-04-29 ENCOUNTER — Telehealth: Payer: Self-pay | Admitting: Cardiovascular Disease

## 2017-04-29 NOTE — Telephone Encounter (Signed)
New message    Pt is calling to ask why he is not on cardiac exercise. He states that his girlfriend is an OR Engineer, civil (consulting) and was asking why. Please call.

## 2017-04-29 NOTE — Telephone Encounter (Signed)
Unable to reach patient on listed number. Last echo showed 40% EF - May need to review w MD for any exercise parameters.

## 2017-04-30 NOTE — Telephone Encounter (Signed)
LM for patient to return call. Olivia Mackie (incoming caller) is not identified in patient's chart as having any relation to patient nor is there a DPR on file.

## 2017-05-06 NOTE — Telephone Encounter (Signed)
msg left for patient to call. 

## 2017-05-28 ENCOUNTER — Encounter: Payer: Self-pay | Admitting: Cardiovascular Disease

## 2017-05-28 ENCOUNTER — Ambulatory Visit (INDEPENDENT_AMBULATORY_CARE_PROVIDER_SITE_OTHER): Payer: 59 | Admitting: Cardiovascular Disease

## 2017-05-28 VITALS — BP 127/72 | HR 77 | Ht 71.0 in | Wt 244.0 lb

## 2017-05-28 DIAGNOSIS — I5021 Acute systolic (congestive) heart failure: Secondary | ICD-10-CM

## 2017-05-28 DIAGNOSIS — I10 Essential (primary) hypertension: Secondary | ICD-10-CM | POA: Diagnosis not present

## 2017-05-28 DIAGNOSIS — I428 Other cardiomyopathies: Secondary | ICD-10-CM | POA: Diagnosis not present

## 2017-05-28 MED ORDER — CARVEDILOL 12.5 MG PO TABS: 12.5000 mg | ORAL_TABLET | Freq: Two times a day (BID) | ORAL | 6 refills | Status: DC

## 2017-05-28 NOTE — Addendum Note (Signed)
Addended by: Evans Lance on: 05/28/2017 12:15 PM   Modules accepted: Orders

## 2017-05-28 NOTE — Assessment & Plan Note (Signed)
History of essential hypertension with blood pressure measured 127/72. He is on Cozaar and carvedilol as well as Lasix. We will titrate his carvedilol and continue his Cozaar at current dosing.

## 2017-05-28 NOTE — Assessment & Plan Note (Signed)
John [name] had a left heart cath by myself 10/21/16 after being admitted with heart failure. This revealed EF of 20% with elevated filling pressures. His coronary arteries were normal suggesting the had a nonischemic cardiomyopathy most likely viral in nature although potentially multifactorial since he did abuse alcohol. He is to stop drinking. He does admit to dietary indiscretion with regard to salt. He is on low-dose carvedilol on the Cozaar, Lasix and potassium repletion. A follow-up 2-D echo performed 02/19/17 revealed moderate improvement in LV function with an EF of 40%. He is asymptomatic at this time. I'm going to titrate his carvedilol to 25 mg by mouth twice a day and recheck a 2-D echo in 3 months. Talked about the importance of medication and dietary compliance, salt restriction less than 2 g a day.

## 2017-05-28 NOTE — Progress Notes (Signed)
05/28/2017 John [name]   1961-04-30  [id number]  Primary Physician Margaree Mackintosh, MD Primary Cardiologist: Runell Gess MD Nicholes Calamity, [place]  HPI:  John [name] is a 56 y.o. male divorced Caucasian male father of 3 children with no grandchildren is accompanied by his girlfriend Olivia Mackie who is an OR Engineer, civil (consulting) at [employer] in [place]. He works in Airline pilot. He relocated from [place] approximately 2 years ago. He has a history of treated hypertension and in the right indiscretion with regards to alcohol and salt. He presented with systolic heart failure in February of this year, was diuresed and underwent right left heart cath by myself using the femoral approach revealing normal coronary arteries, severe LV dysfunction with elevated filling pressures. His mean pulmonary capillary wedge pressure was 33 and his LVEDP was 41. He was placed on medications and discharged. 2-D echo performed in June showed improvement in LV function up to 40% although he continues to abuse salt. He has stopped taking alcohol several weeks ago. He denies symptoms of heart failure at this time.  Current Meds  Medication Sig  . amoxicillin (AMOXIL) 500 MG capsule Take 4 capsules by mouth once. Prior to dental appt this afternoon  . aspirin 81 MG chewable tablet Chew 1 tablet (81 mg total) by mouth daily.  Marland Kitchen buPROPion (WELLBUTRIN XL) 150 MG 24 hr tablet Take 1 tablet by mouth daily.  . carvedilol (COREG) 12.5 MG tablet Take 1 tablet (12.5 mg total) by mouth 2 (two) times daily with a meal.  . escitalopram (LEXAPRO) 10 MG tablet TAKE 0.5 TABLET (5 MG TOTAL) BY MOUTH DAILY.  . fluticasone (FLONASE) 50 MCG/ACT nasal spray Place 2 sprays into both nostrils daily.  . furosemide (LASIX) 40 MG tablet Take 1 tablet (40 mg total) by mouth daily.  Marland Kitchen LORazepam (ATIVAN) 1 MG tablet Take 0.5-1 tablets (0.5-1 mg total) by mouth 3 (three) times daily as needed for anxiety or sleep.  Marland Kitchen losartan (COZAAR) 25 MG  tablet Take 1 tablet by mouth daily.  . Multiple Vitamins-Minerals (MULTIVITAMIN WITH MINERALS) tablet Take 1 tablet by mouth daily.  . potassium chloride SA (K-DUR,KLOR-CON) 20 MEQ tablet Take 1 tablet (20 mEq total) by mouth daily.  . [DISCONTINUED] carvedilol (COREG) 6.25 MG tablet Take 1 tablet (6.25 mg total) by mouth 2 (two) times daily with a meal.     No Known Allergies  Social History   Social History  . Marital status: Divorced    Spouse name: N/A  . Number of children: N/A  . Years of education: N/A   Occupational History  . Not on file.   Social History Main Topics  . Smoking status: Never Smoker  . Smokeless tobacco: Never Used  . Alcohol use 1.8 oz/week    3 Cans of beer per week     Comment: occasional  . Drug use: No  . Sexual activity: Not on file   Other Topics Concern  . Not on file   Social History Narrative  . No narrative on file     Review of Systems: General: negative for chills, fever, night sweats or weight changes.  Cardiovascular: negative for chest pain, dyspnea on exertion, edema, orthopnea, palpitations, paroxysmal nocturnal dyspnea or shortness of breath Dermatological: negative for rash Respiratory: negative for cough or wheezing Urologic: negative for hematuria Abdominal: negative for nausea, vomiting, diarrhea, bright red blood per rectum, melena, or hematemesis Neurologic: negative for visual changes, syncope, or dizziness All other systems  reviewed and are otherwise negative except as noted above.    Blood pressure 127/72, pulse 77, height 5\' 11"  (1.803 m), weight 244 lb (110.7 kg).  General appearance: alert and no distress Neck: no adenopathy, no carotid bruit, no JVD, supple, symmetrical, trachea midline and thyroid not enlarged, symmetric, no tenderness/mass/nodules Lungs: clear to auscultation bilaterally Heart: regular rate and rhythm, S1, S2 normal, no murmur, click, rub or gallop Extremities: extremities normal,  atraumatic, no cyanosis or edema  EKG sinus rhythm at 70 with nonspecific ST and T-wave changes. I personally reviewed this EKG.  ASSESSMENT AND PLAN:   Acute systolic congestive heart failure, NYHA class 4 (HCC) Mr. Tarwater had a left heart cath by myself 10/21/16 after being admitted with heart failure. This revealed EF of 20% with elevated filling pressures. His coronary arteries were normal suggesting the had a nonischemic cardiomyopathy most likely viral in nature although potentially multifactorial since he did abuse alcohol. He is to stop drinking. He does admit to dietary indiscretion with regard to salt. He is on low-dose carvedilol on the Cozaar, Lasix and potassium repletion. A follow-up 2-D echo performed 02/19/17 revealed moderate improvement in LV function with an EF of 40%. He is asymptomatic at this time. I'm going to titrate his carvedilol to 25 mg by mouth twice a day and recheck a 2-D echo in 3 months. Talked about the importance of medication and dietary compliance, salt restriction less than 2 g a day.  Essential hypertension History of essential hypertension with blood pressure measured 127/72. He is on Cozaar and carvedilol as well as Lasix. We will titrate his carvedilol and continue his Cozaar at current dosing.      Runell Gess MD FACP,FACC,FAHA, [facility] 05/28/2017 12:09 PM

## 2017-05-28 NOTE — Patient Instructions (Addendum)
Medication Instructions: Your physician recommends that you continue on your current medications as directed. Please refer to the Current Medication list given to you today.  Increase Carvedilol to 12.5 mg twice daily. I have sent a new prescription to your pharmacy.   Testing/Procedures: Your physician has requested that you have an echocardiogram in 3 months. Echocardiography is a painless test that uses sound waves to create images of your heart. It provides your doctor with information about the size and shape of your heart and how well your heart's chambers and valves are working. This procedure takes approximately one hour. There are no restrictions for this procedure.  Follow-Up: You have been referred to [facility].  Your physician recommends that you schedule a follow-up appointment in: 1 month with PharmD for BP monitoring.  Your physician recommends that you schedule a follow-up appointment in: 3 months with Dr. Allyson Sabal after your echo.  If you need a refill on your cardiac medications before your next appointment, please call your pharmacy.

## 2017-06-05 ENCOUNTER — Telehealth: Payer: Self-pay | Admitting: Cardiovascular Disease

## 2017-06-05 NOTE — Telephone Encounter (Signed)
New message     Patient calling to get update on start date for cardiac rehab. He has not heard from them. Ok to call girlfriend John [name] first.

## 2017-06-05 NOTE — Telephone Encounter (Signed)
Spoke with tressa, aware the referral is in epic, called to cardiac rehab to confirm they can see the referral, left a message for them to contact us if not able to see or to contact the patient about getting started.

## 2017-06-06 ENCOUNTER — Telehealth (HOSPITAL_COMMUNITY): Payer: Self-pay

## 2017-06-06 NOTE — Telephone Encounter (Signed)
Patient insurance is active and benefits verified. Patient insurance is [employer] - no copay, deductible $700/$700 has been met, out of pocket $3500/$1834.96, 20% co-insurance and no pre-authorization. Spoke with Tiffany @ [employer] on 06/06/17. Reference #[id number], she also faxed me patient benefits. Faxed benefits audit [phone].

## 2017-06-12 ENCOUNTER — Telehealth (HOSPITAL_COMMUNITY): Payer: Self-pay | Admitting: Pharmacist

## 2017-06-13 NOTE — Telephone Encounter (Signed)
Cardiac Rehab Medication Review by a Pharmacist  Does the patient  feel that his/her medications are working for him/her?  yes  Has the patient been experiencing any side effects to the medications prescribed?  no  Does the patient measure his/her own blood pressure or blood glucose at home?  no   Does the patient have any problems obtaining medications due to transportation or finances?   no  Understanding of regimen: good Understanding of indications: good Potential of compliance: good   Coreg 2 tabs BID Lorazepam HS prn CPAP every night  Cutting out salt as much as possible  Pharmacist comments: John [name] is a pleasant 56 y/o male who endorses compliance with all medications and denies any adverse effects. He reports his medications have been working well for him and is proud that he does not have to take cholesterol medication. He endorses use of CPAP every night that helps him sleep.   He reports taking carvedilol 2 tablets BID, but is unsure of the strength of the tablets. His girlfriend is an ER nurse that helps him with compliance.   Al Corpus, PharmD PGY1 Pharmacy Resident 06/13/2017 2:01 PM

## 2017-06-17 ENCOUNTER — Encounter (HOSPITAL_COMMUNITY): Payer: Self-pay

## 2017-06-17 ENCOUNTER — Encounter (HOSPITAL_COMMUNITY)
Admission: RE | Admit: 2017-06-17 | Discharge: 2017-06-17 | Disposition: A | Discharge status: Home / Self Care | Payer: 59 | Source: Ambulatory Visit | Attending: Cardiovascular Disease | Admitting: Cardiovascular Disease

## 2017-06-17 VITALS — BP 114/64 | HR 82 | Ht 70.0 in | Wt 246.7 lb

## 2017-06-17 DIAGNOSIS — I5022 Chronic systolic (congestive) heart failure: Secondary | ICD-10-CM

## 2017-06-17 DIAGNOSIS — I429 Cardiomyopathy, unspecified: Secondary | ICD-10-CM | POA: Insufficient documentation

## 2017-06-17 NOTE — Progress Notes (Signed)
Cardiac Individual Treatment Plan  Patient Details  Name: John [name] MRN: [id number] Date of Birth: 1960/12/02 Referring Provider:     CARDIAC REHAB PHASE II ORIENTATION from 06/17/2017 in [facility] CARDIAC REHAB  Referring Provider  Nanetta Batty MD      Initial Encounter Date:    CARDIAC REHAB PHASE II ORIENTATION from 06/17/2017 in [facility] CARDIAC REHAB  Date  06/17/17  Referring Provider  Nanetta Batty MD      Visit Diagnosis: Heart failure, chronic systolic (HCC)  Patient's Home Medications on Admission:  Current Outpatient Prescriptions:  .  amoxicillin (AMOXIL) 500 MG capsule, Take 4 capsules by mouth once. Prior to dental appt this afternoon, Disp: , Rfl:  .  aspirin 81 MG chewable tablet, Chew 1 tablet (81 mg total) by mouth daily., Disp: , Rfl:  .  buPROPion (WELLBUTRIN XL) 150 MG 24 hr tablet, Take 1 tablet by mouth daily., Disp: , Rfl:  .  carvedilol (COREG) 6.25 MG tablet, Take 12.5 mg by mouth 2 (two) times daily., Disp: , Rfl:  .  escitalopram (LEXAPRO) 10 MG tablet, TAKE 0.5 TABLET (5 MG TOTAL) BY MOUTH DAILY., Disp: , Rfl: 2 .  fluticasone (FLONASE) 50 MCG/ACT nasal spray, Place 2 sprays into both nostrils daily. (Patient not taking: Reported on 06/13/2017), Disp: 16 g, Rfl: 3 .  furosemide (LASIX) 40 MG tablet, Take 1 tablet (40 mg total) by mouth daily., Disp: 90 tablet, Rfl: 3 .  LORazepam (ATIVAN) 1 MG tablet, Take 0.5-1 tablets (0.5-1 mg total) by mouth 3 (three) times daily as needed for anxiety or sleep., Disp: 30 tablet, Rfl: 0 .  losartan (COZAAR) 25 MG tablet, Take 1 tablet by mouth daily., Disp: , Rfl:  .  Multiple Vitamins-Minerals (MULTIVITAMIN WITH MINERALS) tablet, Take 1 tablet by mouth daily., Disp: , Rfl:  .  potassium chloride SA (K-DUR,KLOR-CON) 20 MEQ tablet, Take 1 tablet (20 mEq total) by mouth daily., Disp: 90 tablet, Rfl: 3  Past Medical History: Past Medical History:  Diagnosis Date  .  Anxiety    Ativan  . Chronic combined systolic and diastolic CHF, NYHA class 3 (HCC) 10/2016   Nonischemic cardiomyopathy. EF 20-25%.  . Chronic left hip pain   . Depression    history of  . GI bleed 03/2016  . Headache   . Hypertensive heart disease with combined systolic and diastolic congestive heart failure (HCC) 10/2016  . Nonischemic cardiomyopathy (HCC) 10/2016   Echo with EF 20-25%. Cardiac catheterization with no CAD. LVEDP was 41 mmHg, PCWP 36 mmHg  . Osteoarthritis    right shoulder  . Right hand fracture   . Seasonal allergies   . Sleep apnea    wears a CPAP  . Wears glasses     Tobacco Use: History  Smoking Status  . Never Smoker  Smokeless Tobacco  . Never Used    Labs: Recent Review Flowsheet Data    Labs for ITP Cardiac and Pulmonary Rehab Latest Ref Rng & Units 10/21/2016 10/21/2016   PHART 7.350 - 7.450 - 7.427   PCO2ART 32.0 - 48.0 mmHg - 41.7   HCO3 20.0 - 28.0 mmol/L 29.3(H) 27.5   TCO2 0 - 100 mmol/L 31 29   O2SAT % 61.0 99.0      Capillary Blood Glucose: No results found for: GLUCAP   Exercise Target Goals: Date: 06/17/17  Exercise Program Goal: Individual exercise prescription set with THRR, safety & activity barriers. Participant demonstrates ability to understand  and report RPE using BORG scale, to self-measure pulse accurately, and to acknowledge the importance of the exercise prescription.  Exercise Prescription Goal: Starting with aerobic activity 30 plus minutes a day, 3 days per week for initial exercise prescription. Provide home exercise prescription and guidelines that participant acknowledges understanding prior to discharge.  Activity Barriers & Risk Stratification:     Activity Barriers & Cardiac Risk Stratification - 06/17/17 1004      Activity Barriers & Cardiac Risk Stratification   Activity Barriers Left Hip Replacement;Other (comment)   Comments R shoulder replacement   Cardiac Risk Stratification High      6 Minute  Walk:     6 Minute Walk    Row Name 06/17/17 1030         6 Minute Walk   Phase Initial     Distance 1769 feet     Distance Feet Change 1769 ft     Walk Time 6 minutes     # of Rest Breaks 0     MPH 3.4     METS 4.28     RPE 13     VO2 Peak 14.98     Symptoms Yes (comment)     Comments fatigue/slight breathlessness     Resting HR 82 bpm     Resting BP 114/64     Resting Oxygen Saturation  100 %     Exercise Oxygen Saturation  during 6 min walk 97 %     Max Ex. HR 121 bpm     Max Ex. BP 136/72     2 Minute Post BP 120/72        Oxygen Initial Assessment:   Oxygen Re-Evaluation:   Oxygen Discharge (Final Oxygen Re-Evaluation):   Initial Exercise Prescription:     Initial Exercise Prescription - 06/17/17 1000      Date of Initial Exercise RX and Referring Provider   Date 06/17/17   Referring Provider Nanetta Batty MD     Treadmill   MPH 3   Grade 1   Minutes 10   METs 3.71     Bike   Level 1   Minutes 10   METs 2.72     NuStep   Level 4   SPM 80   Minutes 10   METs 3     Prescription Details   Frequency (times per week) 3   Duration Progress to 30 minutes of continuous aerobic without signs/symptoms of physical distress     Intensity   THRR 40-80% of Max Heartrate 66-132   Ratings of Perceived Exertion 11-15   Perceived Dyspnea 0-4     Progression   Progression Continue to progress workloads to maintain intensity without signs/symptoms of physical distress.     Resistance Training   Training Prescription Yes   Weight 5lbs   Reps 10-15      Perform Capillary Blood Glucose checks as needed.  Exercise Prescription Changes:   Exercise Comments:   Exercise Goals and Review:     Exercise Goals    Row Name 06/17/17 1009             Exercise Goals   Increase Physical Activity Yes       Intervention Provide advice, education, support and counseling about physical activity/exercise needs.;Develop an individualized exercise  prescription for aerobic and resistive training based on initial evaluation findings, risk stratification, comorbidities and participant's personal goals.       Expected Outcomes Achievement of increased cardiorespiratory fitness  and enhanced flexibility, muscular endurance and strength shown through measurements of functional capacity and personal statement of participant.       Increase Strength and Stamina Yes       Intervention Provide advice, education, support and counseling about physical activity/exercise needs.;Develop an individualized exercise prescription for aerobic and resistive training based on initial evaluation findings, risk stratification, comorbidities and participant's personal goals.       Expected Outcomes Achievement of increased cardiorespiratory fitness and enhanced flexibility, muscular endurance and strength shown through measurements of functional capacity and personal statement of participant.       Able to understand and use rate of perceived exertion (RPE) scale Yes       Intervention Provide education and explanation on how to use RPE scale       Expected Outcomes Short Term: Able to use RPE daily in rehab to express subjective intensity level;Long Term:  Able to use RPE to guide intensity level when exercising independently       Knowledge and understanding of Target Heart Rate Range (THRR) Yes       Intervention Provide education and explanation of THRR including how the numbers were predicted and where they are located for reference       Expected Outcomes Short Term: Able to state/look up THRR;Long Term: Able to use THRR to govern intensity when exercising independently;Short Term: Able to use daily as guideline for intensity in rehab       Able to check pulse independently Yes       Intervention Provide education and demonstration on how to check pulse in carotid and radial arteries.;Review the importance of being able to check your own pulse for safety during  independent exercise       Expected Outcomes Short Term: Able to explain why pulse checking is important during independent exercise;Long Term: Able to check pulse independently and accurately       Understanding of Exercise Prescription Yes       Intervention Provide education, explanation, and written materials on patient's individual exercise prescription       Expected Outcomes Short Term: Able to explain program exercise prescription;Long Term: Able to explain home exercise prescription to exercise independently          Exercise Goals Re-Evaluation :    Discharge Exercise Prescription (Final Exercise Prescription Changes):   Nutrition:  Target Goals: Understanding of nutrition guidelines, daily intake of sodium 1500mg , cholesterol 200mg , calories 30% from fat and 7% or less from saturated fats, daily to have 5 or more servings of fruits and vegetables.  Biometrics:     Pre Biometrics - 06/17/17 1009      Pre Biometrics   Waist Circumference 47.25 inches   Hip Circumference 47.5 inches   Waist to Hip Ratio 0.99 %   Triceps Skinfold 26 mm   % Body Fat 35.5 %   Grip Strength 36 kg   Flexibility 0 in   Single Leg Stand 2.31 seconds       Nutrition Therapy Plan and Nutrition Goals:     Nutrition Therapy & Goals - 06/17/17 1529      Nutrition Therapy   Diet Therapeutic Lifestyle Changes     Personal Nutrition Goals   Nutrition Goal Pt to identify and limit food sources of saturated fat, trans fat, and sodium   Personal Goal #2 Pt to identify food quantities necessary to achieve weight loss of 6-24 lb (2.7-10.9 kg) at graduation from cardiac rehab.  Intervention Plan   Intervention Prescribe, educate and counsel regarding individualized specific dietary modifications aiming towards targeted core components such as weight, hypertension, lipid management, diabetes, heart failure and other comorbidities.   Expected Outcomes Short Term Goal: Understand basic  principles of dietary content, such as calories, fat, sodium, cholesterol and nutrients.;Long Term Goal: Adherence to prescribed nutrition plan.      Nutrition Discharge: Nutrition Scores:     Nutrition Assessments - 06/17/17 1529      MEDFICTS Scores   Pre Score 55      Nutrition Goals Re-Evaluation:   Nutrition Goals Re-Evaluation:   Nutrition Goals Discharge (Final Nutrition Goals Re-Evaluation):   Psychosocial: Target Goals: Acknowledge presence or absence of significant depression and/or stress, maximize coping skills, provide positive support system. Participant is able to verbalize types and ability to use techniques and skills needed for reducing stress and depression.  Initial Review & Psychosocial Screening:     Initial Psych Review & Screening - 06/17/17 0924      Initial Review   Current issues with None Identified     Family Dynamics   Good Support System? Yes   Comments upon brief assessment, no psychosocial needs identified, no interventions necessary      Barriers   Psychosocial barriers to participate in program There are no identifiable barriers or psychosocial needs.     Screening Interventions   Interventions Encouraged to exercise;To provide support and resources with identified psychosocial needs      Quality of Life Scores:     Quality of Life - 06/17/17 0923      Quality of Life Scores   Health/Function Pre 17.67 %   Socioeconomic Pre 24.38 %   Psych/Spiritual Pre 19.5 %   Family Pre 28.8 %   GLOBAL Pre 21.16 %      PHQ-9: Recent Review Flowsheet Data    There is no flowsheet data to display.     Interpretation of Total Score  Total Score Depression Severity:  1-4 = Minimal depression, 5-9 = Mild depression, 10-14 = Moderate depression, 15-19 = Moderately severe depression, 20-27 = Severe depression   Psychosocial Evaluation and Intervention:   Psychosocial Re-Evaluation:   Psychosocial Discharge (Final Psychosocial  Re-Evaluation):   Vocational Rehabilitation: Provide vocational rehab assistance to qualifying candidates.   Vocational Rehab Evaluation & Intervention:     Vocational Rehab - 06/17/17 [phone]      Initial Vocational Rehab Evaluation & Intervention   Assessment shows need for Vocational Rehabilitation No      Education: Education Goals: Education classes will be provided on a weekly basis, covering required topics. Participant will state understanding/return demonstration of topics presented.  Learning Barriers/Preferences:     Learning Barriers/Preferences - 06/17/17 1015      Learning Barriers/Preferences   Learning Preferences Skilled Demonstration;Verbal Instruction;Video;Audio      Education Topics: Count Your Pulse:  -Group instruction provided by verbal instruction, demonstration, patient participation and written materials to support subject.  Instructors address importance of being able to find your pulse and how to count your pulse when at home without a heart monitor.  Patients get hands on experience counting their pulse with staff help and individually.   Heart Attack, Angina, and Risk Factor Modification:  -Group instruction provided by verbal instruction, video, and written materials to support subject.  Instructors address signs and symptoms of angina and heart attacks.    Also discuss risk factors for heart disease and how to make changes to improve heart health  risk factors.   Functional Fitness:  -Group instruction provided by verbal instruction, demonstration, patient participation, and written materials to support subject.  Instructors address safety measures for doing things around the house.  Discuss how to get up and down off the floor, how to pick things up properly, how to safely get out of a chair without assistance, and balance training.   Meditation and Mindfulness:  -Group instruction provided by verbal instruction, patient participation, and  written materials to support subject.  Instructor addresses importance of mindfulness and meditation practice to help reduce stress and improve awareness.  Instructor also leads participants through a meditation exercise.    Stretching for Flexibility and Mobility:  -Group instruction provided by verbal instruction, patient participation, and written materials to support subject.  Instructors lead participants through series of stretches that are designed to increase flexibility thus improving mobility.  These stretches are additional exercise for major muscle groups that are typically performed during regular warm up and cool down.   Hands Only CPR:  -Group verbal, video, and participation provides a basic overview of AHA guidelines for community CPR. Role-play of emergencies allow participants the opportunity to practice calling for help and chest compression technique with discussion of AED use.   Hypertension: -Group verbal and written instruction that provides a basic overview of hypertension including the most recent diagnostic guidelines, risk factor reduction with self-care instructions and medication management.    Nutrition I class: Heart Healthy Eating:  -Group instruction provided by PowerPoint slides, verbal discussion, and written materials to support subject matter. The instructor gives an explanation and review of the Therapeutic Lifestyle Changes diet recommendations, which includes a discussion on lipid goals, dietary fat, sodium, fiber, plant stanol/sterol esters, sugar, and the components of a well-balanced, healthy diet.   Nutrition II class: Lifestyle Skills:  -Group instruction provided by PowerPoint slides, verbal discussion, and written materials to support subject matter. The instructor gives an explanation and review of label reading, grocery shopping for heart health, heart healthy recipe modifications, and ways to make healthier choices when eating out.   Diabetes  Question & Answer:  -Group instruction provided by PowerPoint slides, verbal discussion, and written materials to support subject matter. The instructor gives an explanation and review of diabetes co-morbidities, pre- and post-prandial blood glucose goals, pre-exercise blood glucose goals, signs, symptoms, and treatment of hypoglycemia and hyperglycemia, and foot care basics.   Diabetes Blitz:  -Group instruction provided by PowerPoint slides, verbal discussion, and written materials to support subject matter. The instructor gives an explanation and review of the physiology behind type 1 and type 2 diabetes, diabetes medications and rational behind using different medications, pre- and post-prandial blood glucose recommendations and Hemoglobin A1c goals, diabetes diet, and exercise including blood glucose guidelines for exercising safely.    Portion Distortion:  -Group instruction provided by PowerPoint slides, verbal discussion, written materials, and food models to support subject matter. The instructor gives an explanation of serving size versus portion size, changes in portions sizes over the last 20 years, and what consists of a serving from each food group.   Stress Management:  -Group instruction provided by verbal instruction, video, and written materials to support subject matter.  Instructors review role of stress in heart disease and how to cope with stress positively.     Exercising on Your Own:  -Group instruction provided by verbal instruction, power point, and written materials to support subject.  Instructors discuss benefits of exercise, components of exercise, frequency and intensity of  exercise, and end points for exercise.  Also discuss use of nitroglycerin and activating EMS.  Review options of places to exercise outside of rehab.  Review guidelines for sex with heart disease.   Cardiac Drugs I:  -Group instruction provided by verbal instruction and written materials to  support subject.  Instructor reviews cardiac drug classes: antiplatelets, anticoagulants, beta blockers, and statins.  Instructor discusses reasons, side effects, and lifestyle considerations for each drug class.   Cardiac Drugs II:  -Group instruction provided by verbal instruction and written materials to support subject.  Instructor reviews cardiac drug classes: angiotensin converting enzyme inhibitors (ACE-I), angiotensin II receptor blockers (ARBs), nitrates, and calcium channel blockers.  Instructor discusses reasons, side effects, and lifestyle considerations for each drug class.   Anatomy and Physiology of the Circulatory System:  Group verbal and written instruction and models provide basic cardiac anatomy and physiology, with the coronary electrical and arterial systems. Review of: AMI, Angina, Valve disease, Heart Failure, Peripheral Artery Disease, Cardiac Arrhythmia, Pacemakers, and the ICD.   Other Education:  -Group or individual verbal, written, or video instructions that support the educational goals of the cardiac rehab program.   Knowledge Questionnaire Score:     Knowledge Questionnaire Score - 06/17/17 0923      Knowledge Questionnaire Score   Pre Score 20/24      Core Components/Risk Factors/Patient Goals at Admission:     Personal Goals and Risk Factors at Admission - 06/17/17 1010      Core Components/Risk Factors/Patient Goals on Admission    Weight Management Yes;Weight Maintenance;Obesity;Weight Loss   Intervention Weight Management: Develop a combined nutrition and exercise program designed to reach desired caloric intake, while maintaining appropriate intake of nutrient and fiber, sodium and fats, and appropriate energy expenditure required for the weight goal.;Weight Management: Provide education and appropriate resources to help participant work on and attain dietary goals.;Obesity: Provide education and appropriate resources to help participant work on  and attain dietary goals.;Weight Management/Obesity: Establish reasonable short term and long term weight goals.   Admit Weight 246 lb 11.1 oz (111.9 kg)   Goal Weight: Short Term 236 lb (107 kg)   Goal Weight: Long Term 206 lb (93.4 kg)   Expected Outcomes Short Term: Continue to assess and modify interventions until short term weight is achieved;Long Term: Adherence to nutrition and physical activity/exercise program aimed toward attainment of established weight goal;Weight Maintenance: Understanding of the daily nutrition guidelines, which includes 25-35% calories from fat, 7% or less cal from saturated fats, less than  cholesterol, less than 1.5gm of sodium, & 5 or more servings of fruits and vegetables daily;Weight Loss: Understanding of general recommendations for a balanced deficit meal plan, which promotes 1-2 lb weight loss per week and includes a negative energy balance of [phone] kcal/d;Understanding recommendations for meals to include 15-35% energy as protein, 25-35% energy from fat, 35-60% energy from carbohydrates, less than  of dietary cholesterol, 20-35 gm of total fiber daily;Understanding of distribution of calorie intake throughout the day with the consumption of 4-5 meals/snacks   Hypertension Yes   Intervention Provide education on lifestyle modifcations including regular physical activity/exercise, weight management, moderate sodium restriction and increased consumption of fresh fruit, vegetables, and low fat dairy, alcohol moderation, and smoking cessation.;Monitor prescription use compliance.   Expected Outcomes Long Term: Maintenance of blood pressure at goal levels.;Short Term: Continued assessment and intervention until BP is < 140/90mm HG in hypertensive participants. < 130/62mm HG in hypertensive participants with diabetes, heart failure or  chronic kidney disease.      Core Components/Risk Factors/Patient Goals Review:    Core Components/Risk Factors/Patient  Goals at Discharge (Final Review):    ITP Comments:     ITP Comments    Row Name 06/17/17 0958           ITP Comments Medical Director, Dr. Armanda Magic          Comments: Patient attended orientation from [phone] to 1021  to review rules and guidelines for program. Completed 6 minute walk test, Intitial ITP, and exercise prescription.  VSS. Telemetry-sinus rhythm, NSSTT change,   Asymptomatic.

## 2017-06-17 NOTE — Progress Notes (Signed)
John [name] 56 y.o. male DOB: April 15, 1961 MRN: [id number]      Nutrition Note  1. Heart failure, chronic systolic (HCC)    Past Medical History:  Diagnosis Date  . Anxiety    Ativan  . Chronic combined systolic and diastolic CHF, NYHA class 3 (HCC) 10/2016   Nonischemic cardiomyopathy. EF 20-25%.  . Chronic left hip pain   . Depression    history of  . GI bleed 03/2016  . Headache   . Hypertensive heart disease with combined systolic and diastolic congestive heart failure (HCC) 10/2016  . Nonischemic cardiomyopathy (HCC) 10/2016   Echo with EF 20-25%. Cardiac catheterization with no CAD. LVEDP was 41 mmHg, PCWP 36 mmHg  . Osteoarthritis    right shoulder  . Right hand fracture   . Seasonal allergies   . Sleep apnea    wears a CPAP  . Wears glasses    Meds reviewed.  HT: Ht Readings from Last 1 Encounters:  06/17/17 5\' 10"  (1.778 m)    WT: Wt Readings from Last 3 Encounters:  06/17/17 246 lb 11.1 oz (111.9 kg)  05/28/17 244 lb (110.7 kg)  11/08/16 218 lb (98.9 kg)     BMI 35.4   Current tobacco use? No  Labs:  Lipid Panel  No results found for: CHOL, TRIG, HDL, CHOLHDL, VLDL, LDLCALC, LDLDIRECT  No results found for: HGBA1C CBG (last 3)  No results for input(s): GLUCAP in the last 72 hours.  Nutrition Note Spoke with pt. Nutrition plan and goals reviewed with pt. Pt is following Step 1 of the Therapeutic Lifestyle Changes diet. Pt wants to lose wt. Wt loss tips reviewed. Pt with dx of CHF. Pt aware of the need to watch sodium intake. Barrier to diet/lifestyle changes include pt takes clients out to eat frequently. Pt states his girlfriend, who is an OR nurse at [employer], is very supportive of diet and lifestyle changes. Pt expressed understanding of the information reviewed. Pt aware of nutrition education classes offered and plans on attending nutrition classes.  Nutrition Diagnosis ? Food-and nutrition-related knowledge deficit related to lack of exposure to  information as related to diagnosis of: ? CHF ? Obesity related to excessive energy intake as evidenced by a BMI of 35.4  Nutrition Intervention ? Pt's individual nutrition plan and goals reviewed with pt. ? Pt given handouts for: ? Nutrition I class ? Nutrition II class   Nutrition Goal(s):  ? Pt to identify and limit food sources of saturated fat, trans fat, and sodium ? Pt to identify food quantities necessary to achieve weight loss of 6-24 lb (2.7-10.9 kg) at graduation from cardiac rehab.  Plan:  Pt to attend nutrition classes ? Nutrition I ? Nutrition II ? Portion Distortion  Will provide client-centered nutrition education as part of interdisciplinary care.   Monitor and evaluate progress toward nutrition goal with team.  Mickle Plumb, M.Ed, RD, LDN, CDE 06/17/2017 3:25 PM

## 2017-06-23 ENCOUNTER — Encounter (HOSPITAL_COMMUNITY): Payer: Self-pay

## 2017-06-23 ENCOUNTER — Encounter (HOSPITAL_COMMUNITY)
Admission: RE | Admit: 2017-06-23 | Discharge: 2017-06-23 | Disposition: A | Discharge status: Home / Self Care | Payer: 59 | Source: Ambulatory Visit | Attending: Cardiology | Admitting: Cardiology

## 2017-06-23 ENCOUNTER — Encounter (HOSPITAL_COMMUNITY): Payer: 59

## 2017-06-23 DIAGNOSIS — I5022 Chronic systolic (congestive) heart failure: Secondary | ICD-10-CM

## 2017-06-23 DIAGNOSIS — I429 Cardiomyopathy, unspecified: Secondary | ICD-10-CM | POA: Diagnosis not present

## 2017-06-23 NOTE — Progress Notes (Addendum)
Daily Session Note  Patient Details  Name: John [name] MRN: [id number] Date of Birth: [birth year] Referring Provider:     CARDIAC REHAB PHASE II ORIENTATION from 06/17/2017 in [place]  Referring Provider  [name]      Encounter Date: 06/23/2017  Check In:     Session Check In - 06/23/17 0645      Check-In   Location [facility]   Staff Present Seward Carol, MS, ACSM CEP, Exercise Physiologist;Crawford Tamura, RN, Luisa Hart, RN, BSN   Supervising physician immediately available to respond to emergencies [facility] immediately available   Physician(s) Dr. Zigmund Daniel   Medication changes reported     No   Fall or balance concerns reported    No   Tobacco Cessation No Change   Warm-up and Cool-down Performed as group-led instruction   Resistance Training Performed Yes   VAD Patient? No     Pain Assessment   Currently in Pain? No/denies   Multiple Pain Sites No      Capillary Blood Glucose: No results found for this or any previous visit (from the past 24 hour(s)).    History  Smoking Status  . Never Smoker  Smokeless Tobacco  . Never Used    Goals Met:  Exercise tolerated well  Goals Unmet:  Not Applicable  Comments: Pt started cardiac rehab today.  Pt tolerated light exercise without difficulty. VSS, telemetry-sinus  asymptomatic.  Medication list not reconciled, pt is not familiar with his medications.  His girlfriend prepares medicine for him.he will bring list to next scheduled session. Pt denies barriers to medicaiton compliance.  PSYCHOSOCIAL ASSESSMENT:  PHQ-0.  Pt exhibits positive coping skills, hopeful outlook with supportive family. No psychosocial needs identified at this time, no psychosocial interventions necessary.    Pt enjoys spending time with his family.  Pt goals are to increase EF, develop safe exercise regimen without fear of activity and lose weight. Pt encouraged to  participate in CR exercise and education opportunities to foster ability to achieve these goals.    Pt oriented to exercise equipment and routine.    Understanding verbalized.   Dr. Fransico Him is Medical Director for Cardiac Rehab at [facility].

## 2017-06-24 NOTE — Progress Notes (Signed)
Cardiac Individual Treatment Plan  Patient Details  Name: John MorinCharles Mohar MRN: [id number] Date of Birth: 14-Dec-1960 Referring Provider:     CARDIAC REHAB PHASE II ORIENTATION from 06/17/2017 in [facility] [place] [facility]  Referring Provider  Nanetta BattyBerry, Jonathan MD      Initial Encounter Date:    CARDIAC REH[facility]ASE [facility] [place] [facility]  Date  06/17/17  Referring Provider  Nanetta BattyBerry, Jonathan MD      Visit Diagnosis: Heart failure, chronic systolic (HCC)  Patient's Home Medications on Admission:  Current Outpatient Prescriptions:  .  amoxicillin (AMOXIL) 500 MG capsule, Take 4 capsules by mouth once. Prior to dental appt this afternoon, Disp: , Rfl:  .  aspirin 81 MG chewable tablet, Chew 1 tablet (81 mg total) by mouth daily., Disp: , Rfl:  .  buPROPion (WELLBUTRIN XL) 150 MG 24 hr tablet, Take 1 tablet by mouth daily., Disp: , Rfl:  .  carvedilol (COREG) 6.25 MG tablet, Take 12.5 mg by mouth 2 (two) times daily., Disp: , Rfl:  .  escitalopram (LEXAPRO) 10 MG tablet, TAKE 0.5 TABLET (5 MG TOTAL) BY MOUTH DAILY., Disp: , Rfl: 2 .  fluticasone (FLONASE) 50 MCG/ACT nasal spray, Place 2 sprays into both nostrils daily., Disp: 16 g, Rfl: 3 .  furosemide (LASIX) 40 MG tablet, Take 1 tablet (40 mg total) by mouth daily., Disp: 90 tablet, Rfl: 3 .  LORazepam (ATIVAN) 1 MG tablet, Take 0.5-1 tablets (0.5-1 mg total) by mouth 3 (three) times daily as needed for anxiety or sleep., Disp: 30 tablet, Rfl: 0 .  losartan (COZAAR) 25 MG tablet, Take 1 tablet by mouth daily., Disp: , Rfl:  .  Multiple Vitamins-Minerals (MULTIVITAMIN WITH MINERALS) tablet, Take 1 tablet by mouth daily., Disp: , Rfl:  .  potassium chloride SA (K-DUR,KLOR-CON) 20 MEQ tablet, Take 1 tablet (20 mEq total) by mouth daily., Disp: 90 tablet, Rfl: 3  Past Medical History: Past Medical History:  Diagnosis Date  . Anxiety    Ativan  . Chronic combined systolic  and diastolic CHF, NYHA class 3 (HCC) 10/2016   Nonischemic cardiomyopathy. EF 20-25%.  . Chronic left hip pain   . Depression    history of  . GI bleed 03/2016  . Headache   . Hypertensive heart disease with combined systolic and diastolic congestive heart failure (HCC) 10/2016  . Nonischemic cardiomyopathy (HCC) 10/2016   Echo with EF 20-25%. Cardiac catheterization with no CAD. LVEDP was 41 mmHg, PCWP 36 mmHg  . Osteoarthritis    right shoulder  . Right hand fracture   . Seasonal allergies   . Sleep apnea    wears a CPAP  . Wears glasses     Tobacco Use: History  Smoking Status  . Never Smoker  Smokeless Tobacco  . Never Used    Labs: Recent Review Flowsheet Data    Labs for ITP Cardiac and Pulmonary Rehab Latest Ref Rng & Units 10/21/2016 10/21/2016   PHART 7.350 - 7.450 - 7.427   PCO2ART 32.0 - 48.0 mmHg - 41.7   HCO3 20.0 - 28.0 mmol/L 29.3(H) 27.5   TCO2 0 - 100 mmol/L 31 29   O2SAT % 61.0 99.0      Capillary Blood Glucose: No results found for: GLUCAP   Exercise Target Goals:    Exercise Program Goal: Individual exercise prescription set with THRR, safety & activity barriers. Participant demonstrates ability to understand and report RPE using BORG scale,  to self-measure pulse accurately, and to acknowledge the importance of the exercise prescription.  Exercise Prescription Goal: Starting with aerobic activity 30 plus minutes a day, 3 days per week for initial exercise prescription. Provide home exercise prescription and guidelines that participant acknowledges understanding prior to discharge.  Activity Barriers & Risk Stratification:     Activity Barriers & Cardiac Risk Stratification - 06/17/17 1004      Activity Barriers & Cardiac Risk Stratification   Activity Barriers Left Hip Replacement;Other (comment)   Comments R shoulder replacement   Cardiac Risk Stratification High      6 Minute Walk:     6 Minute Walk    Row Name 06/17/17 1030          6 Minute Walk   Phase Initial     Distance 1769 feet     Distance Feet Change 1769 ft     Walk Time 6 minutes     # of Rest Breaks 0     MPH 3.4     METS 4.28     RPE 13     VO2 Peak 14.98     Symptoms Yes (comment)     Comments fatigue/slight breathlessness     Resting HR 82 bpm     Resting BP 114/64     Resting Oxygen Saturation  100 %     Exercise Oxygen Saturation  during 6 min walk 97 %     Max Ex. HR 121 bpm     Max Ex. BP 136/72     2 Minute Post BP 120/72        Oxygen Initial Assessment:   Oxygen Re-Evaluation:   Oxygen Discharge (Final Oxygen Re-Evaluation):   Initial Exercise Prescription:     Initial Exercise Prescription - 06/17/17 1000      Date of Initial Exercise RX and Referring Provider   Date 06/17/17   Referring Provider Nanetta Batty MD     Treadmill   MPH 3   Grade 1   Minutes 10   METs 3.71     Bike   Level 1   Minutes 10   METs 2.72     NuStep   Level 4   SPM 80   Minutes 10   METs 3     Prescription Details   Frequency (times per week) 3   Duration Progress to 30 minutes of continuous aerobic without signs/symptoms of physical distress     Intensity   THRR 40-80% of Max Heartrate 66-132   Ratings of Perceived Exertion 11-15   Perceived Dyspnea 0-4     Progression   Progression Continue to progress workloads to maintain intensity without signs/symptoms of physical distress.     Resistance Training   Training Prescription Yes   Weight 5lbs   Reps 10-15      Perform Capillary Blood Glucose checks as needed.  Exercise Prescription Changes:     Exercise Prescription Changes    Row Name 06/23/17 1100             Response to Exercise   Blood Pressure (Admit) 112/78       Blood Pressure (Exercise) 128/78       Blood Pressure (Exit) 98/60       Heart Rate (Admit) 91 bpm       Heart Rate (Exercise) 108 bpm       Heart Rate (Exit) 88 bpm       Rating of Perceived Exertion (Exercise) 15  Symptoms  none       Comments Decresed workload on TM due to dificulty.       Duration Progress to 30 minutes of  aerobic without signs/symptoms of physical distress       Intensity THRR unchanged         Progression   Progression Continue to progress workloads to maintain intensity without signs/symptoms of physical distress.       Average METs 2.5         Resistance Training   Training Prescription Yes       Weight 2lbs       Reps 10-15       Time 10 Minutes         Interval Training   Interval Training No         Treadmill   MPH 2.2  3.0 mph on the TM too difficult, will decrease speed to 2.2.       Grade 1       Minutes 10  Pt did 5 minutes at 3.26mph then rested.       METs 2.99         Bike   Level 1       Minutes 10       METs 2.69         NuStep   Level 3       SPM 80       Minutes 10       METs 1.8          Exercise Comments:     Exercise Comments    Row Name 06/23/17 1149           Exercise Comments Off to a good start with exercise, adjusted workloads due to dificulty.          Exercise Goals and Review:     Exercise Goals    Row Name 06/17/17 1009             Exercise Goals   Increase Physical Activity Yes       Intervention Provide advice, education, support and counseling about physical activity/exercise needs.;Develop an individualized exercise prescription for aerobic and resistive training based on initial evaluation findings, risk stratification, comorbidities and participant's personal goals.       Expected Outcomes Achievement of increased cardiorespiratory fitness and enhanced flexibility, muscular endurance and strength shown through measurements of functional capacity and personal statement of participant.       Increase Strength and Stamina Yes       Intervention Provide advice, education, support and counseling about physical activity/exercise needs.;Develop an individualized exercise prescription for aerobic and resistive training  based on initial evaluation findings, risk stratification, comorbidities and participant's personal goals.       Expected Outcomes Achievement of increased cardiorespiratory fitness and enhanced flexibility, muscular endurance and strength shown through measurements of functional capacity and personal statement of participant.       Able to understand and use rate of perceived exertion (RPE) scale Yes       Intervention Provide education and explanation on how to use RPE scale       Expected Outcomes Short Term: Able to use RPE daily in rehab to express subjective intensity level;Long Term:  Able to use RPE to guide intensity level when exercising independently       Knowledge and understanding of Target Heart Rate Range (THRR) Yes       Intervention Provide education and explanation of  THRR including how the numbers were predicted and where they are located for reference       Expected Outcomes Short Term: Able to state/look up THRR;Long Term: Able to use THRR to govern intensity when exercising independently;Short Term: Able to use daily as guideline for intensity in rehab       Able to check pulse independently Yes       Intervention Provide education and demonstration on how to check pulse in carotid and radial arteries.;Review the importance of being able to check your own pulse for safety during independent exercise       Expected Outcomes Short Term: Able to explain why pulse checking is important during independent exercise;Long Term: Able to check pulse independently and accurately       Understanding of Exercise Prescription Yes       Intervention Provide education, explanation, and written materials on patient's individual exercise prescription       Expected Outcomes Short Term: Able to explain program exercise prescription;Long Term: Able to explain home exercise prescription to exercise independently          Exercise Goals Re-Evaluation :     Exercise Goals Re-Evaluation    Row  Name 06/23/17 0800             Exercise Goal Re-Evaluation   Exercise Goals Review Able to understand and use rate of perceived exertion (RPE) scale;Increase Physical Activity       Comments Off to a good start with exercise. Decreased speed on treadmill due to difficulty. Demonstrates understanding of and ability to RPE scale.       Expected Outcomes Increase workloads as tolerated.           Discharge Exercise Prescription (Final Exercise Prescription Changes):     Exercise Prescription Changes - 06/23/17 1100      Response to Exercise   Blood Pressure (Admit) 112/78   Blood Pressure (Exercise) 128/78   Blood Pressure (Exit) 98/60   Heart Rate (Admit) 91 bpm   Heart Rate (Exercise) 108 bpm   Heart Rate (Exit) 88 bpm   Rating of Perceived Exertion (Exercise) 15   Symptoms none   Comments Decresed workload on TM due to dificulty.   Duration Progress to 30 minutes of  aerobic without signs/symptoms of physical distress   Intensity THRR unchanged     Progression   Progression Continue to progress workloads to maintain intensity without signs/symptoms of physical distress.   Average METs 2.5     Resistance Training   Training Prescription Yes   Weight 2lbs   Reps 10-15   Time 10 Minutes     Interval Training   Interval Training No     Treadmill   MPH 2.2  3.0 mph on the TM too difficult, will decrease speed to 2.2.   Grade 1   Minutes 10  Pt did 5 minutes at 3.65mph then rested.   METs 2.99     Bike   Level 1   Minutes 10   METs 2.69     NuStep   Level 3   SPM 80   Minutes 10   METs 1.8      Nutrition:  Target Goals: Understanding of nutrition guidelines, daily intake of sodium 1500mg , cholesterol 200mg , calories 30% from fat and 7% or less from saturated fats, daily to have 5 or more servings of fruits and vegetables.  Biometrics:     Pre Biometrics - 06/17/17 1009  Pre Biometrics   Waist Circumference 47.25 inches   Hip Circumference  47.5 inches   Waist to Hip Ratio 0.99 %   Triceps Skinfold 26 mm   % Body Fat 35.5 %   Grip Strength 36 kg   Flexibility 0 in   Single Leg Stand 2.31 seconds       Nutrition Therapy Plan and Nutrition Goals:     Nutrition Therapy & Goals - 06/17/17 1529      Nutrition Therapy   Diet Therapeutic Lifestyle Changes     Personal Nutrition Goals   Nutrition Goal Pt to identify and limit food sources of saturated fat, trans fat, and sodium   Personal Goal #2 Pt to identify food quantities necessary to achieve weight loss of 6-24 lb (2.7-10.9 kg) at graduation from cardiac rehab.      Intervention Plan   Intervention Prescribe, educate and counsel regarding individualized specific dietary modifications aiming towards targeted core components such as weight, hypertension, lipid management, diabetes, heart failure and other comorbidities.   Expected Outcomes Short Term Goal: Understand basic principles of dietary content, such as calories, fat, sodium, cholesterol and nutrients.;Long Term Goal: Adherence to prescribed nutrition plan.      Nutrition Discharge: Nutrition Scores:     Nutrition Assessments - 06/17/17 1529      MEDFICTS Scores   Pre Score 55      Nutrition Goals Re-Evaluation:   Nutrition Goals Re-Evaluation:   Nutrition Goals Discharge (Final Nutrition Goals Re-Evaluation):   Psychosocial: Target Goals: Acknowledge presence or absence of significant depression and/or stress, maximize coping skills, provide positive support system. Participant is able to verbalize types and ability to use techniques and skills needed for reducing stress and depression.  Initial Review & Psychosocial Screening:     Initial Psych Review & Screening - 06/17/17 0924      Initial Review   Current issues with None Identified     Family Dynamics   Good Support System? Yes   Comments upon brief assessment, no psychosocial needs identified, no interventions necessary       Barriers   Psychosocial barriers to participate in program There are no identifiable barriers or psychosocial needs.     Screening Interventions   Interventions Encouraged to exercise;To provide support and resources with identified psychosocial needs      Quality of Life Scores:     Quality of Life - 06/17/17 0923      Quality of Life Scores   Health/Function Pre 17.67 %   Socioeconomic Pre 24.38 %   Psych/Spiritual Pre 19.5 %   Family Pre 28.8 %   GLOBAL Pre 21.16 %      PHQ-9: Recent Review Flowsheet Data    Depression screen [facility] 2/9 06/23/2017   Decreased Interest 0   Down, Depressed, Hopeless 0   PHQ - 2 Score 0     Interpretation of Total Score  Total Score Depression Severity:  1-4 = Minimal depression, 5-9 = Mild depression, 10-14 = Moderate depression, 15-19 = Moderately severe depression, 20-27 = Severe depression   Psychosocial Evaluation and Intervention:     Psychosocial Evaluation - 06/23/17 0744      Psychosocial Evaluation & Interventions   Interventions Encouraged to exercise with the program and follow exercise prescription   Comments no psychosocial needs identified, no interventions necessary.  pt enjoys spending time with his family.    Expected Outcomes pt will exhibit positive outlook with good coping skills.    Continue Psychosocial Services  No Follow up required      Psychosocial Re-Evaluation:     Psychosocial Re-Evaluation    Row Name 06/24/17 1510             Psychosocial Re-Evaluation   Current issues with None Identified       Comments no psychosocial needs identified, no interventions necessary .        Expected Outcomes pt will exhibit positive outlook with good coping skills.        Interventions Encouraged to attend Cardiac Rehabilitation for the exercise;Stress management education;Relaxation education       Continue Psychosocial Services  No Follow up required          Psychosocial Discharge (Final Psychosocial  Re-Evaluation):     Psychosocial Re-Evaluation - 06/24/17 1510      Psychosocial Re-Evaluation   Current issues with None Identified   Comments no psychosocial needs identified, no interventions necessary .    Expected Outcomes pt will exhibit positive outlook with good coping skills.    Interventions Encouraged to attend Cardiac Rehabilitation for the exercise;Stress management education;Relaxation education   Continue Psychosocial Services  No Follow up required      Vocational Rehabilitation: Provide vocational rehab assistance to qualifying candidates.   Vocational Rehab Evaluation & Intervention:     Vocational Rehab - 06/17/17 [phone]      Initial Vocational Rehab Evaluation & Intervention   Assessment shows need for Vocational Rehabilitation No      Education: Education Goals: Education classes will be provided on a weekly basis, covering required topics. Participant will state understanding/return demonstration of topics presented.  Learning Barriers/Preferences:     Learning Barriers/Preferences - 06/17/17 1015      Learning Barriers/Preferences   Learning Preferences Skilled Demonstration;Verbal Instruction;Video;Audio      Education Topics: Count Your Pulse:  -Group instruction provided by verbal instruction, demonstration, patient participation and written materials to support subject.  Instructors address importance of being able to find your pulse and how to count your pulse when at home without a heart monitor.  Patients get hands on experience counting their pulse with staff help and individually.   Heart Attack, Angina, and Risk Factor Modification:  -Group instruction provided by verbal instruction, video, and written materials to support subject.  Instructors address signs and symptoms of angina and heart attacks.    Also discuss risk factors for heart disease and how to make changes to improve heart health risk factors.   Functional Fitness:  -Group  instruction provided by verbal instruction, demonstration, patient participation, and written materials to support subject.  Instructors address safety measures for doing things around the house.  Discuss how to get up and down off the floor, how to pick things up properly, how to safely get out of a chair without assistance, and balance training.   Meditation and Mindfulness:  -Group instruction provided by verbal instruction, patient participation, and written materials to support subject.  Instructor addresses importance of mindfulness and meditation practice to help reduce stress and improve awareness.  Instructor also leads participants through a meditation exercise.    Stretching for Flexibility and Mobility:  -Group instruction provided by verbal instruction, patient participation, and written materials to support subject.  Instructors lead participants through series of stretches that are designed to increase flexibility thus improving mobility.  These stretches are additional exercise for major muscle groups that are typically performed during regular warm up and cool down.   Hands Only CPR:  -Group verbal, video, and  participation provides a basic overview of AHA guidelines for community CPR. Role-play of emergencies allow participants the opportunity to practice calling for help and chest compression technique with discussion of AED use.   Hypertension: -Group verbal and written instruction that provides a basic overview of hypertension including the most recent diagnostic guidelines, risk factor reduction with self-care instructions and medication management.    Nutrition I class: Heart Healthy Eating:  -Group instruction provided by PowerPoint slides, verbal discussion, and written materials to support subject matter. The instructor gives an explanation and review of the Therapeutic Lifestyle Changes diet recommendations, which includes a discussion on lipid goals, dietary fat,  sodium, fiber, plant stanol/sterol esters, sugar, and the components of a well-balanced, healthy diet.   Nutrition II class: Lifestyle Skills:  -Group instruction provided by PowerPoint slides, verbal discussion, and written materials to support subject matter. The instructor gives an explanation and review of label reading, grocery shopping for heart health, heart healthy recipe modifications, and ways to make healthier choices when eating out.   Diabetes Question & Answer:  -Group instruction provided by PowerPoint slides, verbal discussion, and written materials to support subject matter. The instructor gives an explanation and review of diabetes co-morbidities, pre- and post-prandial blood glucose goals, pre-exercise blood glucose goals, signs, symptoms, and treatment of hypoglycemia and hyperglycemia, and foot care basics.   Diabetes Blitz:  -Group instruction provided by PowerPoint slides, verbal discussion, and written materials to support subject matter. The instructor gives an explanation and review of the physiology behind type 1 and type 2 diabetes, diabetes medications and rational behind using different medications, pre- and post-prandial blood glucose recommendations and Hemoglobin A1c goals, diabetes diet, and exercise including blood glucose guidelines for exercising safely.    Portion Distortion:  -Group instruction provided by PowerPoint slides, verbal discussion, written materials, and food models to support subject matter. The instructor gives an explanation of serving size versus portion size, changes in portions sizes over the last 20 years, and what consists of a serving from each food group.   Stress Management:  -Group instruction provided by verbal instruction, video, and written materials to support subject matter.  Instructors review role of stress in heart disease and how to cope with stress positively.     Exercising on Your Own:  -Group instruction provided by  verbal instruction, power point, and written materials to support subject.  Instructors discuss benefits of exercise, components of exercise, frequency and intensity of exercise, and end points for exercise.  Also discuss use of nitroglycerin and activating EMS.  Review options of places to exercise outside of rehab.  Review guidelines for sex with heart disease.   Cardiac Drugs I:  -Group instruction provided by verbal instruction and written materials to support subject.  Instructor reviews cardiac drug classes: antiplatelets, anticoagulants, beta blockers, and statins.  Instructor discusses reasons, side effects, and lifestyle considerations for each drug class.   Cardiac Drugs II:  -Group instruction provided by verbal instruction and written materials to support subject.  Instructor reviews cardiac drug classes: angiotensin converting enzyme inhibitors (ACE-I), angiotensin II receptor blockers (ARBs), nitrates, and calcium channel blockers.  Instructor discusses reasons, side effects, and lifestyle considerations for each drug class.   Anatomy and Physiology of the Circulatory System:  Group verbal and written instruction and models provide basic cardiac anatomy and physiology, with the coronary electrical and arterial systems. Review of: AMI, Angina, Valve disease, Heart Failure, Peripheral Artery Disease, Cardiac Arrhythmia, Pacemakers, and the ICD.   Other Education:  -  Group or individual verbal, written, or video instructions that support the educational goals of the cardiac rehab program.   Knowledge Questionnaire Score:     Knowledge Questionnaire Score - 06/17/17 0923      Knowledge Questionnaire Score   Pre Score 20/24      Core Components/Risk Factors/Patient Goals at Admission:     Personal Goals and Risk Factors at Admission - 06/23/17 0744      Core Components/Risk Factors/Patient Goals on Admission    Weight Management Yes;Weight Maintenance;Obesity;Weight Loss    Intervention Weight Management: Develop a combined nutrition and exercise program designed to reach desired caloric intake, while maintaining appropriate intake of nutrient and fiber, sodium and fats, and appropriate energy expenditure required for the weight goal.   Admit Weight 246 lb 11.1 oz (111.9 kg)   Goal Weight: Short Term 236 lb (107 kg)   Goal Weight: Long Term 206 lb (93.4 kg)   Expected Outcomes Short Term: Continue to assess and modify interventions until short term weight is achieved;Long Term: Adherence to nutrition and physical activity/exercise program aimed toward attainment of established weight goal;Weight Maintenance: Understanding of the daily nutrition guidelines, which includes 25-35% calories from fat, 7% or less cal from saturated fats, less than 200mg  cholesterol, less than 1.5gm of sodium, & 5 or more servings of fruits and vegetables daily;Weight Loss: Understanding of general recommendations for a balanced deficit meal plan, which promotes 1-2 lb weight loss per week and includes a negative energy balance of [phone] kcal/d;Understanding recommendations for meals to include 15-35% energy as protein, 25-35% energy from fat, 35-60% energy from carbohydrates, less than 200mg  of dietary cholesterol, 20-35 gm of total fiber daily;Understanding of distribution of calorie intake throughout the day with the consumption of 4-5 meals/snacks   Hypertension Yes   Intervention Monitor prescription use compliance.;Provide education on lifestyle modifcations including regular physical activity/exercise, weight management, moderate sodium restriction and increased consumption of fresh fruit, vegetables, and low fat dairy, alcohol moderation, and smoking cessation.   Expected Outcomes Short Term: Continued assessment and intervention until BP is < 140/39mm HG in hypertensive participants. < 130/70mm HG in hypertensive participants with diabetes, heart failure or chronic kidney disease.;Long Term:  Maintenance of blood pressure at goal levels.   Personal Goal Other Yes   Personal Goal pt personal goals are to increase EF, lose weight, decrease fear of activity to develop exercise regimen.    Intervention Provide CR exercise, nutrition and lifestyle  modification education opportunities.   Expected Outcomes pt will participate in CR exercise, nutrition and lifestyle modification education       Core Components/Risk Factors/Patient Goals Review:      Goals and Risk Factor Review    Row Name 06/24/17 1509             Core Components/Risk Factors/Patient Goals Review   Personal Goals Review Weight Management/Obesity;Hypertension;Other       Review pt with multiple CAD RF demonstrates eagerness to participate in CR program.        Expected Outcomes pt will participate in CR exercise, nutrition and lifestyle modification offerings to decrease overall RF.           Core Components/Risk Factors/Patient Goals at Discharge (Final Review):      Goals and Risk Factor Review - 06/24/17 1509      Core Components/Risk Factors/Patient Goals Review   Personal Goals Review Weight Management/Obesity;Hypertension;Other   Review pt with multiple CAD RF demonstrates eagerness to participate in CR program.  Expected Outcomes pt will participate in CR exercise, nutrition and lifestyle modification offerings to decrease overall RF.       ITP Comments:     ITP Comments    Row Name 06/17/17 0958 06/24/17 1507         ITP Comments Medical Director, Dr. Armanda Magic 30 day ITP review.  Pt new this week  to group exercise sessions.   No change in current regimen unless directed by Medical Director.           Comments:

## 2017-06-25 ENCOUNTER — Encounter (HOSPITAL_COMMUNITY)
Admission: RE | Admit: 2017-06-25 | Discharge: 2017-06-25 | Disposition: A | Discharge status: Home / Self Care | Payer: 59 | Source: Ambulatory Visit | Attending: Cardiology | Admitting: Cardiology

## 2017-06-25 ENCOUNTER — Encounter (HOSPITAL_COMMUNITY): Payer: 59

## 2017-06-25 DIAGNOSIS — I429 Cardiomyopathy, unspecified: Secondary | ICD-10-CM | POA: Diagnosis not present

## 2017-06-25 DIAGNOSIS — I5022 Chronic systolic (congestive) heart failure: Secondary | ICD-10-CM

## 2017-06-25 NOTE — Progress Notes (Signed)
Incomplete Session Note  Patient Details  Name: John [name] MRN: [id number] Date of Birth: 10-09-1960 Referring Provider:     CARDIAC REHAB PHASE II ORIENTATION from 06/17/2017 in [facility]  Referring Provider  Nanetta Batty MD      [name] did not complete his rehab session.   Pt arrived at cardiac rehab c/o nausea, vomiting and chills that started today.  Pt denies pain or dyspnea.  T-100.6, BP: 130/70, 02sat -100%.  p tinstructed to hold exercise until symptoms resolved for 48 hours, sip small frequent fluids to maintain hydration, notify PCP if symptoms unimproved or worsen.  Understanding verbalized. Deveron Furlong, RN, BSN Cardiac Pulmonary Rehab

## 2017-06-27 ENCOUNTER — Encounter (HOSPITAL_COMMUNITY): Payer: 59

## 2017-06-29 ENCOUNTER — Emergency Department (HOSPITAL_BASED_OUTPATIENT_CLINIC_OR_DEPARTMENT_OTHER): Payer: 59

## 2017-06-29 ENCOUNTER — Emergency Department (HOSPITAL_BASED_OUTPATIENT_CLINIC_OR_DEPARTMENT_OTHER)
Admission: EM | Admit: 2017-06-29 | Discharge: 2017-06-29 | Disposition: A | Discharge status: Home / Self Care | Payer: 59 | Attending: Emergency Medicine | Admitting: Emergency Medicine

## 2017-06-29 ENCOUNTER — Encounter (HOSPITAL_BASED_OUTPATIENT_CLINIC_OR_DEPARTMENT_OTHER): Payer: Self-pay | Admitting: Emergency Medicine

## 2017-06-29 DIAGNOSIS — G8929 Other chronic pain: Secondary | ICD-10-CM | POA: Insufficient documentation

## 2017-06-29 DIAGNOSIS — N5082 Scrotal pain: Secondary | ICD-10-CM | POA: Diagnosis not present

## 2017-06-29 DIAGNOSIS — I861 Scrotal varices: Secondary | ICD-10-CM | POA: Diagnosis not present

## 2017-06-29 DIAGNOSIS — Z79899 Other long term (current) drug therapy: Secondary | ICD-10-CM | POA: Insufficient documentation

## 2017-06-29 DIAGNOSIS — N433 Hydrocele, unspecified: Secondary | ICD-10-CM | POA: Diagnosis not present

## 2017-06-29 DIAGNOSIS — I5042 Chronic combined systolic (congestive) and diastolic (congestive) heart failure: Secondary | ICD-10-CM | POA: Insufficient documentation

## 2017-06-29 DIAGNOSIS — N50811 Right testicular pain: Secondary | ICD-10-CM | POA: Diagnosis present

## 2017-06-29 DIAGNOSIS — Z7982 Long term (current) use of aspirin: Secondary | ICD-10-CM | POA: Insufficient documentation

## 2017-06-29 DIAGNOSIS — I110 Hypertensive heart disease with heart failure: Secondary | ICD-10-CM | POA: Insufficient documentation

## 2017-06-29 LAB — CBC WITH DIFFERENTIAL/PLATELET
Basophils Absolute: 0.1 10*3/uL (ref 0.0–0.1)
Basophils Relative: 1 %
EOS ABS: 0.1 10*3/uL (ref 0.0–0.7)
EOS PCT: 1 %
HCT: 32.3 % — ABNORMAL LOW (ref 39.0–52.0)
Hemoglobin: 10.1 g/dL — ABNORMAL LOW (ref 13.0–17.0)
LYMPHS ABS: 1.4 10*3/uL (ref 0.7–4.0)
LYMPHS PCT: 13 %
MCH: 32.7 pg (ref 26.0–34.0)
MCHC: 31.3 g/dL (ref 30.0–36.0)
MCV: 104.5 fL — AB (ref 78.0–100.0)
MONO ABS: 1.4 10*3/uL — AB (ref 0.1–1.0)
MONOS PCT: 13 %
Neutro Abs: 7.9 10*3/uL — ABNORMAL HIGH (ref 1.7–7.7)
Neutrophils Relative %: 72 %
PLATELETS: 440 10*3/uL — AB (ref 150–400)
RBC: 3.09 MIL/uL — AB (ref 4.22–5.81)
RDW: 11.6 % (ref 11.5–15.5)
WBC: 10.7 10*3/uL — ABNORMAL HIGH (ref 4.0–10.5)

## 2017-06-29 LAB — COMPREHENSIVE METABOLIC PANEL
ALT: 45 U/L (ref 17–63)
ANION GAP: 9 (ref 5–15)
AST: 39 U/L (ref 15–41)
Albumin: 2.9 g/dL — ABNORMAL LOW (ref 3.5–5.0)
Alkaline Phosphatase: 62 U/L (ref 38–126)
BUN: 10 mg/dL (ref 6–20)
CALCIUM: 8.9 mg/dL (ref 8.9–10.3)
CHLORIDE: 100 mmol/L — AB (ref 101–111)
CO2: 26 mmol/L (ref 22–32)
CREATININE: 0.9 mg/dL (ref 0.61–1.24)
GFR calc non Af Amer: >60 mL/min (ref >60)
Glucose, Bld: 110 mg/dL — ABNORMAL HIGH (ref 65–99)
Potassium: 4.1 mmol/L (ref 3.5–5.1)
SODIUM: 135 mmol/L (ref 135–145)
Total Bilirubin: 0.4 mg/dL (ref 0.3–1.2)
Total Protein: 7.2 g/dL (ref 6.5–8.1)

## 2017-06-29 LAB — URINALYSIS, ROUTINE W REFLEX MICROSCOPIC
BILIRUBIN URINE: NEGATIVE
GLUCOSE, UA: NEGATIVE mg/dL
HGB URINE DIPSTICK: NEGATIVE
Ketones, ur: NEGATIVE mg/dL
Leukocytes, UA: NEGATIVE
Nitrite: NEGATIVE
PH: 6 (ref 5.0–8.0)
Protein, ur: NEGATIVE mg/dL
SPECIFIC GRAVITY, URINE: 1.015 (ref 1.005–1.030)

## 2017-06-29 NOTE — Discharge Instructions (Signed)
Please take tylenol and ibuprofen as needed for pain control. You have a varicocele and hydrocele on the testicles that is causing some of your pain. Please follow-up as needed with the urologist.  You do not have signs of bacterial infection today. Your fevers may be due to virus.   Please follow-up with PCP later this week for recheck  Please return for worsening symptoms, including persistent fever, confusion, extreme fatigue, difficulty breathing or any other symptoms concerning to you

## 2017-06-29 NOTE — ED Provider Notes (Signed)
[facility] Provider Note   CSN: [id number] Arrival date & time: 06/29/17  1054     History   Chief Complaint Chief Complaint  Patient presents with  . Testicle Pain    HPI John [name] is a 56 y.o. male.  The history is provided by the patient.  Testicle Pain  This is a new problem. The current episode started yesterday. The problem occurs constantly. The problem has not changed since onset.Associated symptoms include shortness of breath. Pertinent negatives include no chest pain, no abdominal pain and no headaches. Nothing aggravates the symptoms. Nothing relieves the symptoms. He has tried nothing for the symptoms.   56 year old male who presents with scrotal swelling and pain. He has a history of nonischemic caiomyopathy wi EF of 40%.Reports that 4 days ago, he developed a fever of 101 Fahrenheit while he was at cardiac rehabilitation. He had one episode of vomiting that fully resolved, but no other symptoms. Was diagnosed with likely viral infection by cardiac rehabilitation specialist. States that yesterday noticed swelling and tenderness to his testicles.Still having persistent fevers with a MAXIMUM TEMPERATURE of 101 yesterday evening. Has had some occasional night sweats but no body aches, headaches, confusion,coughing, abdominal pain, diarrhea, dysuria, urinary frequency, penile discharge, or hematuria. No known sick contacts.   Past Medical History:  Diagnosis Date  . Anxiety    Ativan  . Chronic combined systolic and diastolic CHF, NYHA class 3 (HCC) 10/2016   Nonischemic cardiomyopathy. EF 20-25%.  . Chronic left hip pain   . Depression    history of  . GI bleed 03/2016  . Headache   . Hypertensive heart disease with combined systolic and diastolic congestive heart failure (HCC) 10/2016  . Nonischemic cardiomyopathy (HCC) 10/2016   Echo with EF 20-25%. Cardiac catheterization with no CAD. LVEDP was 41 mmHg, PCWP 36 mmHg  . Osteoarthritis    right  shoulder  . Right hand fracture   . Seasonal allergies   . Sleep apnea    wears a CPAP  . Wears glasses     Patient Active Problem List   Diagnosis Date Noted  . Essential hypertension 11/08/2016  . Non-ischemic cardiomyopathy (HCC)   . Hyponatremia 10/18/2016  . AKI (acute kidney injury) (HCC) 10/18/2016  . Acute systolic congestive heart failure, NYHA class 4 (HCC) 10/17/2016  . GI bleed 03/19/2016  . Elevated troponin 03/19/2016  . Acute blood loss anemia 03/19/2016  . EtOH dependence (HCC) 03/19/2016  . Acute upper GI bleed 03/19/2016  . S/P shoulder replacement 11/24/2015  . Elevated transaminase level 03/27/2015  . Degenerative joint disease of left hip 03/27/2015    Past Surgical History:  Procedure Laterality Date  . ESOPHAGOGASTRODUODENOSCOPY (EGD) WITH PROPOFOL N/A 03/20/2016   Procedure: ESOPHAGOGASTRODUODENOSCOPY (EGD) WITH PROPOFOL;  Surgeon: Charlott Rakes, MD;  Location: [facility] ENDOSCOPY;  Service: Endoscopy;  Laterality: N/A;  . RIGHT/LEFT HEART CATH AND CORONARY ANGIOGRAPHY N/A 10/21/2016   Procedure: Right/Left Heart Cath and Coronary Angiography;  Surgeon: Runell Gess, MD;  Location: [name] INVASIVE CV LAB;  Service: Cardiovascular: Angiographically normal coronary arteries. PCWP 33-36 mmHg, LVEDP 41 mmHg. PA pressure 60/35, mean 46 mmHg.  Cardiac output/cardiac index-3.93 /1.76 (Fick), 3.49/1.57 (thermodilution)  . TOTAL HIP ARTHROPLASTY Left 03/27/2015   Procedure: LEFT TOTAL HIP ARTHROPLASTY ANTERIOR APPROACH;  Surgeon: Samson Frederic, MD;  Location: [facility] OR;  Service: Orthopedics;  Laterality: Left;  . TOTAL SHOULDER ARTHROPLASTY Right 11/24/2015   Procedure: RIGHT TOTAL SHOULDER ARTHROPLASTY;  Surgeon: Beverely Low, MD;  Location:  [facility] OR;  Service: Orthopedics;  Laterality: Right;  . TRANSTHORACIC ECHOCARDIOGRAM  10/2016   EF 20-25%. Diffuse hypokinesis but akinesis of the entire inferoseptal wall and apical wall. Moderate biatrial enlargement. PA pressure estimated  64 mmHg.  . WISDOM TOOTH EXTRACTION         Home Medications    Prior to Admission medications   Medication Sig Start Date End Date Taking? Authorizing Provider  amoxicillin (AMOXIL) 500 MG capsule Take 4 capsules by mouth once. Prior to dental appt this afternoon 05/26/17   [provider]  aspirin 81 MG chewable tablet Chew 1 tablet (81 mg total) by mouth daily. 10/23/16   Dorothea Ogle, MD  buPROPion (WELLBUTRIN XL) 150 MG 24 hr tablet Take 1 tablet by mouth daily. 05/09/17   [provider]  carvedilol (COREG) 6.25 MG tablet Take 12.5 mg by mouth 2 (two) times daily. 03/25/17   [provider]  escitalopram (LEXAPRO) 10 MG tablet TAKE 0.5 TABLET (5 MG TOTAL) BY MOUTH DAILY. 10/31/15   [provider]  fluticasone (FLONASE) 50 MCG/ACT nasal spray Place 2 sprays into both nostrils daily. 10/22/16   Dorothea Ogle, MD  furosemide (LASIX) 40 MG tablet Take 1 tablet (40 mg total) by mouth daily. 11/08/16   Strader, Lennart Pall, PA-C  LORazepam (ATIVAN) 1 MG tablet Take 0.5-1 tablets (0.5-1 mg total) by mouth 3 (three) times daily as needed for anxiety or sleep. 10/21/16   Dorothea Ogle, MD  losartan (COZAAR) 25 MG tablet Take 1 tablet by mouth daily. 04/23/17   [provider]  Multiple Vitamins-Minerals (MULTIVITAMIN WITH MINERALS) tablet Take 1 tablet by mouth daily.    [provider]  potassium chloride SA (K-DUR,KLOR-CON) 20 MEQ tablet Take 1 tablet (20 mEq total) by mouth daily. 11/08/16   Ellsworth Lennox, PA-C    Family History Family History  Problem Relation Age of Onset  . Congenital heart disease Sister     Social History Social History  Substance Use Topics  . Smoking status: Never Smoker  . Smokeless tobacco: Never Used  . Alcohol use 1.8 oz/week    3 Cans of beer per week     Comment: occasional     Allergies   Patient has no known allergies.   Review of Systems Review of Systems  Constitutional: Positive for  fever.  Respiratory: Positive for shortness of breath. Negative for cough.   Cardiovascular: Negative for chest pain.  Gastrointestinal: Negative for abdominal pain.  Genitourinary: Positive for testicular pain.  Musculoskeletal: Negative for back pain.  Neurological: Negative for headaches.  All other systems reviewed and are negative.    Physical Exam Updated Vital Signs BP 104/74 (BP Location: Left Arm)   Pulse 66   Temp 98.8 F (37.1 C) (Oral)   Resp 15   Ht  (1.803 m)   Wt 111.6 kg (246 lb)   SpO2 98%   BMI 34.31 kg/m   Physical Exam Physical Exam  Nursing note and vitals reviewed. Constitutional: Well developed, well nourished, non-toxic, and in no acute distress Head: Normocephalic and atraumatic.  Mouth/Throat: Oropharynx is clear and moist.  Neck: Normal range of motion. Neck supple.  Cardiovascular: Normal rate and regular rhythm.   Pulmonary/Chest: Effort normal and breath sounds normal.  Abdominal: Soft. There is no tenderness. There is no rebound and no guarding.  Musculoskeletal: Normal range of motion.  Neurological: Alert, no facial droop, fluent speech, moves all extremities symmetrically Skin: Skin is  warm and dry.  Psychiatric: Cooperative  GU: Minimal scrotal swelling. Mild tenderness to palpation of bilateral testicles.normal bilateral cremasteric reflexes.   ED Treatments / Results  Labs (all labs ordered are listed, but only abnormal results are displayed) Labs Reviewed  CBC WITH DIFFERENTIAL/PLATELET - Abnormal; Notable for the following:       Result Value   WBC 10.7 (*)    RBC 3.09 (*)    Hemoglobin 10.1 (*)    HCT 32.3 (*)    MCV 104.5 (*)    Platelets 440 (*)    Neutro Abs 7.9 (*)    Monocytes Absolute 1.4 (*)    All other components within normal limits  COMPREHENSIVE METABOLIC PANEL - Abnormal; Notable for the following:    Chloride 100 (*)    Glucose, Bld 110 (*)    Albumin 2.9 (*)    All other components within normal  limits  URINALYSIS, ROUTINE W REFLEX MICROSCOPIC    EKG  EKG Interpretation  Date/Time:  Sunday June 29 2017 13:25:48 EDT Ventricular Rate:  66 PR Interval:    QRS Duration: 112 QT Interval:  474 QTC Calculation: 497 R Axis:   31 Text Interpretation:  Sinus rhythm Borderline intraventricular conduction delay Borderline low voltage, extremity leads Minimal ST depression, anterolateral leads Borderline prolonged QT interval no significant changes  Confirmed by Crista Curb [phone]) on 06/29/2017 1:43:29 PM       Radiology Dg Chest 2 View  Result Date: 06/29/2017 CLINICAL DATA:  Fever and shortness of breath for 4 days. EXAM: CHEST  2 VIEW COMPARISON:  PA and lateral chest 10/17/2016 and 03/19/2016. FINDINGS: There is cardiomegaly without edema. The lungs are clear. No pneumothorax or pleural effusion. No acute bony abnormality. Right shoulder replacement noted. IMPRESSION: No acute disease. Electronically Signed   By: Drusilla Kanner M.D.   On: 06/29/2017 13:04   US Scrotum  Result Date: 06/29/2017 CLINICAL DATA:  Bilateral testicular pain and swelling, left greater than right. Patient describes the pain as dull and achy. EXAM: ULTRASOUND OF SCROTUM TECHNIQUE: Complete ultrasound examination of the testicles, epididymis, and other scrotal structures was performed. COMPARISON:  None. FINDINGS: Right testicle Measurements: 4.8 x 2.3 x 3.5 cm. No mass or microlithiasis visualized. Left testicle Measurements: 4.5 x 2.2 x 2.9 cm. No mass or microlithiasis visualized. Right epididymis:  Normal in size and appearance. Left epididymis:  Normal in size and appearance. Hydrocele:  Small right-sided hydrocele. Varicocele:  Bilateral varicocele, more pronounced on the left. IMPRESSION: Normal appearance of bilateral testicles. Small right-sided hydrocele. Moderate left and mild right varicocele. Electronically Signed   By: Ted Mcalpine M.D.   On: 06/29/2017 13:12   [place] Scrotom Doppler  Result  Date: 06/29/2017 CLINICAL DATA:  Bilateral testicular pain and swelling, left greater than right. Patient describes the pain as dull and achy. EXAM: ULTRASOUND OF SCROTUM TECHNIQUE: Complete ultrasound examination of the testicles, epididymis, and other scrotal structures was performed. COMPARISON:  None. FINDINGS: Right testicle Measurements: 4.8 x 2.3 x 3.5 cm. No mass or microlithiasis visualized. Left testicle Measurements: 4.5 x 2.2 x 2.9 cm. No mass or microlithiasis visualized. Right epididymis:  Normal in size and appearance. Left epididymis:  Normal in size and appearance. Hydrocele:  Small right-sided hydrocele. Varicocele:  Bilateral varicocele, more pronounced on the left. IMPRESSION: Normal appearance of bilateral testicles. Small right-sided hydrocele. Moderate left and mild right varicocele. Electronically Signed   By: Ted Mcalpine M.D.   On: 06/29/2017 13:12    Procedures  Procedures (including critical care time)  Medications Ordered in ED Medications - No data to display   Initial Impression / Assessment and Plan / ED Course  I have reviewed the triage vital signs and the nursing notes.  Pertinent labs & imaging results that were available during my care of the patient were reviewed by me and considered in my medical decision making (see chart for details).     56 year old male who presents with fever, scrotal pain and swelling.He is afebrile, hemodynamically stable. He is clinically well-appearing and in no acute distress. Abdomen is soft and benign. There is minimal scrotal swelling, and mild tenderness to palpation over bilateral testicles with normal cremasteric reflexes. Scrotal ultrasound reveals no evidence of infection or torsion. He does have hydrocele and bilateral varicoceles, which are likely causing his symptoms.  He has no signs of serious infection at this time. Blood work is reassuring. UA is unremarkable. Chest x-ray is visualized and shows no acute  cardiopulmonary processes. At this time, possible viral infection is suspected. He will follow-up with urologist as needed. Will follow up with PCP later this week. He'll return to ED for any worsening symptoms or prolonged fevers. Strict return and follow-up instructions reviewed. She expressed understanding of all discharge instructions and felt comfortable with the plan of care.   Final Clinical Impressions(s) / ED Diagnoses   Final diagnoses:  Scrotal pain  Varicocele  Hydrocele in adult    New Prescriptions New Prescriptions   No medications on file     Lavera Guise, MD 06/29/17 1535

## 2017-06-29 NOTE — ED Triage Notes (Signed)
Patient reports that he has a fever since wed. Last night he had swelling to his testicle and and higher fever last night.

## 2017-06-30 ENCOUNTER — Telehealth (HOSPITAL_COMMUNITY): Payer: Self-pay | Admitting: *Deleted

## 2017-06-30 ENCOUNTER — Encounter (HOSPITAL_COMMUNITY): Payer: 59

## 2017-07-02 ENCOUNTER — Encounter (HOSPITAL_COMMUNITY): Payer: 59

## 2017-07-03 ENCOUNTER — Ambulatory Visit: Payer: 59

## 2017-07-04 ENCOUNTER — Encounter (HOSPITAL_COMMUNITY)
Admission: RE | Admit: 2017-07-04 | Discharge: 2017-07-04 | Disposition: A | Discharge status: Home / Self Care | Payer: 59 | Source: Ambulatory Visit | Attending: Cardiology | Admitting: Cardiology

## 2017-07-04 ENCOUNTER — Encounter (HOSPITAL_COMMUNITY): Payer: 59

## 2017-07-04 DIAGNOSIS — I5022 Chronic systolic (congestive) heart failure: Secondary | ICD-10-CM

## 2017-07-04 DIAGNOSIS — I429 Cardiomyopathy, unspecified: Secondary | ICD-10-CM | POA: Diagnosis not present

## 2017-07-07 ENCOUNTER — Encounter (HOSPITAL_COMMUNITY)
Admission: RE | Admit: 2017-07-07 | Discharge: 2017-07-07 | Disposition: A | Discharge status: Home / Self Care | Payer: 59 | Source: Ambulatory Visit | Attending: Cardiology | Admitting: Cardiology

## 2017-07-07 ENCOUNTER — Encounter (HOSPITAL_COMMUNITY): Payer: 59

## 2017-07-07 DIAGNOSIS — I5022 Chronic systolic (congestive) heart failure: Secondary | ICD-10-CM

## 2017-07-07 DIAGNOSIS — I429 Cardiomyopathy, unspecified: Secondary | ICD-10-CM | POA: Diagnosis not present

## 2017-07-09 ENCOUNTER — Encounter (HOSPITAL_COMMUNITY): Payer: 59

## 2017-07-11 ENCOUNTER — Encounter (HOSPITAL_COMMUNITY)
Admission: RE | Admit: 2017-07-11 | Discharge: 2017-07-11 | Disposition: A | Discharge status: Home / Self Care | Payer: 59 | Source: Ambulatory Visit | Attending: Cardiology | Admitting: Cardiology

## 2017-07-11 ENCOUNTER — Encounter (HOSPITAL_COMMUNITY): Payer: 59

## 2017-07-11 DIAGNOSIS — I429 Cardiomyopathy, unspecified: Secondary | ICD-10-CM | POA: Diagnosis not present

## 2017-07-11 DIAGNOSIS — I5022 Chronic systolic (congestive) heart failure: Secondary | ICD-10-CM

## 2017-07-11 NOTE — Progress Notes (Signed)
Reviewed home exercise guidelines with patient including endpoints, temperature precautions, target heart rate and rate of perceived exertion. Pt plans to walk and has a gym at home with at a stationary bike that he plans to use as his mode of home exercise. Pt will increase walking duration from 10 minutes to 30 minutes 3 days/week by adding 1-2 minutes each session until reaching goal of 30 minutes. Pt voices understanding of instructions given. Artist Pais, MS, ACSM CEP

## 2017-07-14 ENCOUNTER — Encounter (HOSPITAL_COMMUNITY): Payer: 59

## 2017-07-14 ENCOUNTER — Encounter (HOSPITAL_COMMUNITY)
Admission: RE | Admit: 2017-07-14 | Discharge: 2017-07-14 | Disposition: A | Discharge status: Home / Self Care | Payer: 59 | Source: Ambulatory Visit | Attending: Cardiology | Admitting: Cardiology

## 2017-07-14 DIAGNOSIS — I429 Cardiomyopathy, unspecified: Secondary | ICD-10-CM | POA: Diagnosis not present

## 2017-07-14 DIAGNOSIS — I5022 Chronic systolic (congestive) heart failure: Secondary | ICD-10-CM

## 2017-07-16 ENCOUNTER — Encounter (HOSPITAL_COMMUNITY)
Admission: RE | Admit: 2017-07-16 | Discharge: 2017-07-16 | Disposition: A | Discharge status: Home / Self Care | Payer: 59 | Source: Ambulatory Visit | Attending: Cardiology | Admitting: Cardiology

## 2017-07-16 ENCOUNTER — Encounter (HOSPITAL_COMMUNITY): Payer: 59

## 2017-07-16 DIAGNOSIS — I429 Cardiomyopathy, unspecified: Secondary | ICD-10-CM | POA: Diagnosis not present

## 2017-07-16 DIAGNOSIS — I5022 Chronic systolic (congestive) heart failure: Secondary | ICD-10-CM

## 2017-07-17 ENCOUNTER — Ambulatory Visit: Payer: 59

## 2017-07-18 ENCOUNTER — Encounter (HOSPITAL_COMMUNITY)
Admission: RE | Admit: 2017-07-18 | Discharge: 2017-07-18 | Disposition: A | Discharge status: Home / Self Care | Payer: 59 | Source: Ambulatory Visit | Attending: Cardiology | Admitting: Cardiology

## 2017-07-18 ENCOUNTER — Encounter (HOSPITAL_COMMUNITY): Payer: 59

## 2017-07-18 DIAGNOSIS — I429 Cardiomyopathy, unspecified: Secondary | ICD-10-CM | POA: Diagnosis present

## 2017-07-18 DIAGNOSIS — I5022 Chronic systolic (congestive) heart failure: Secondary | ICD-10-CM

## 2017-07-18 NOTE — Progress Notes (Signed)
Cardiac Individual Treatment Plan  Patient Details  Name: John [name] MRN: [id number] Date of Birth: 14-Dec-1960 Referring Provider:     CARDIAC REHAB PHASE II ORIENTATION from 06/17/2017 in [facility] [place] [facility]  Referring Provider  Nanetta BattyBerry, Jonathan MD      Initial Encounter Date:    CARDIAC REHAB PHASE II [facility] [place] [facility]  Date  06/17/17  Referring Provider  Nanetta BattyBerry, Jonathan MD      Visit Diagnosis: Heart failure, chronic systolic (HCC)  Patient's Home Medications on Admission:  Current Outpatient Prescriptions:  .  amoxicillin (AMOXIL) 500 MG capsule, Take 4 capsules by mouth once. Prior to dental appt this afternoon, Disp: , Rfl:  .  aspirin 81 MG chewable tablet, Chew 1 tablet (81 mg total) by mouth daily., Disp: , Rfl:  .  buPROPion (WELLBUTRIN XL) 150 MG 24 hr tablet, Take 1 tablet by mouth daily., Disp: , Rfl:  .  carvedilol (COREG) 6.25 MG tablet, Take 12.5 mg by mouth 2 (two) times daily., Disp: , Rfl:  .  escitalopram (LEXAPRO) 10 MG tablet, TAKE 0.5 TABLET (5 MG TOTAL) BY MOUTH DAILY., Disp: , Rfl: 2 .  fluticasone (FLONASE) 50 MCG/ACT nasal spray, Place 2 sprays into both nostrils daily., Disp: 16 g, Rfl: 3 .  furosemide (LASIX) 40 MG tablet, Take 1 tablet (40 mg total) by mouth daily., Disp: 90 tablet, Rfl: 3 .  LORazepam (ATIVAN) 1 MG tablet, Take 0.5-1 tablets (0.5-1 mg total) by mouth 3 (three) times daily as needed for anxiety or sleep., Disp: 30 tablet, Rfl: 0 .  losartan (COZAAR) 25 MG tablet, Take 1 tablet by mouth daily., Disp: , Rfl:  .  Multiple Vitamins-Minerals (MULTIVITAMIN WITH MINERALS) tablet, Take 1 tablet by mouth daily., Disp: , Rfl:  .  potassium chloride SA (K-DUR,KLOR-CON) 20 MEQ tablet, Take 1 tablet (20 mEq total) by mouth daily., Disp: 90 tablet, Rfl: 3  Past Medical History: Past Medical History:  Diagnosis Date  . Anxiety    Ativan  . Chronic combined systolic  and diastolic CHF, NYHA class 3 (HCC) 10/2016   Nonischemic cardiomyopathy. EF 20-25%.  . Chronic left hip pain   . Depression    history of  . GI bleed 03/2016  . Headache   . Hypertensive heart disease with combined systolic and diastolic congestive heart failure (HCC) 10/2016  . Nonischemic cardiomyopathy (HCC) 10/2016   Echo with EF 20-25%. Cardiac catheterization with no CAD. LVEDP was 41 mmHg, PCWP 36 mmHg  . Osteoarthritis    right shoulder  . Right hand fracture   . Seasonal allergies   . Sleep apnea    wears a CPAP  . Wears glasses     Tobacco Use: History  Smoking Status  . Never Smoker  Smokeless Tobacco  . Never Used    Labs: Recent Review Flowsheet Data    Labs for ITP Cardiac and Pulmonary Rehab Latest Ref Rng & Units 10/21/2016 10/21/2016   PHART 7.350 - 7.450 - 7.427   PCO2ART 32.0 - 48.0 mmHg - 41.7   HCO3 20.0 - 28.0 mmol/L 29.3(H) 27.5   TCO2 0 - 100 mmol/L 31 29   O2SAT % 61.0 99.0      Capillary Blood Glucose: No results found for: GLUCAP   Exercise Target Goals:    Exercise Program Goal: Individual exercise prescription set with THRR, safety & activity barriers. Participant demonstrates ability to understand and report RPE using BORG scale,  to self-measure pulse accurately, and to acknowledge the importance of the exercise prescription.  Exercise Prescription Goal: Starting with aerobic activity 30 plus minutes a day, 3 days per week for initial exercise prescription. Provide home exercise prescription and guidelines that participant acknowledges understanding prior to discharge.  Activity Barriers & Risk Stratification:     Activity Barriers & Cardiac Risk Stratification - 06/17/17 1004      Activity Barriers & Cardiac Risk Stratification   Activity Barriers Left Hip Replacement;Other (comment)   Comments R shoulder replacement   Cardiac Risk Stratification High      6 Minute Walk:     6 Minute Walk    Row Name 06/17/17 1030          6 Minute Walk   Phase Initial     Distance 1769 feet     Distance Feet Change 1769 ft     Walk Time 6 minutes     # of Rest Breaks 0     MPH 3.4     METS 4.28     RPE 13     VO2 Peak 14.98     Symptoms Yes (comment)     Comments fatigue/slight breathlessness     Resting HR 82 bpm     Resting BP 114/64     Resting Oxygen Saturation  100 %     Exercise Oxygen Saturation  during 6 min walk 97 %     Max Ex. HR 121 bpm     Max Ex. BP 136/72     2 Minute Post BP 120/72        Oxygen Initial Assessment:   Oxygen Re-Evaluation:   Oxygen Discharge (Final Oxygen Re-Evaluation):   Initial Exercise Prescription:     Initial Exercise Prescription - 06/17/17 1000      Date of Initial Exercise RX and Referring Provider   Date 06/17/17   Referring Provider Nanetta Batty MD     Treadmill   MPH 3   Grade 1   Minutes 10   METs 3.71     Bike   Level 1   Minutes 10   METs 2.72     NuStep   Level 4   SPM 80   Minutes 10   METs 3     Prescription Details   Frequency (times per week) 3   Duration Progress to 30 minutes of continuous aerobic without signs/symptoms of physical distress     Intensity   THRR 40-80% of Max Heartrate 66-132   Ratings of Perceived Exertion 11-15   Perceived Dyspnea 0-4     Progression   Progression Continue to progress workloads to maintain intensity without signs/symptoms of physical distress.     Resistance Training   Training Prescription Yes   Weight 5lbs   Reps 10-15      Perform Capillary Blood Glucose checks as needed.  Exercise Prescription Changes:     Exercise Prescription Changes    Row Name 06/23/17 1100 07/04/17 1500 07/11/17 0800 07/14/17 0900       Response to Exercise   Blood Pressure (Admit) 112/78 128/62 118/78 108/70    Blood Pressure (Exercise) 128/78 102/78 118/66 138/80    Blood Pressure (Exit) 98/60 100/78 110/68 112/68    Heart Rate (Admit) 91 bpm 89 bpm 97 bpm 94 bpm    Heart Rate (Exercise)  108 bpm 110 bpm 109 bpm 115 bpm    Heart Rate (Exit) 88 bpm 69 bpm 85 bpm 87 bpm  Rating of Perceived Exertion (Exercise) 15 12 12 12     Symptoms none none none none    Comments Decresed workload on TM due to dificulty.  -  -  -    Duration Progress to 30 minutes of  aerobic without signs/symptoms of physical distress Progress to 30 minutes of  aerobic without signs/symptoms of physical distress Progress to 30 minutes of  aerobic without signs/symptoms of physical distress Progress to 30 minutes of  aerobic without signs/symptoms of physical distress    Intensity THRR unchanged THRR unchanged THRR unchanged THRR unchanged      Progression   Progression Continue to progress workloads to maintain intensity without signs/symptoms of physical distress. Continue to progress workloads to maintain intensity without signs/symptoms of physical distress. Continue to progress workloads to maintain intensity without signs/symptoms of physical distress. Continue to progress workloads to maintain intensity without signs/symptoms of physical distress.    Average METs 2.5 2.5 2.7 3.5      Resistance Training   Training Prescription Yes Yes Yes Yes    Weight 2lbs 3lbs 3lbs 5lbs    Reps 10-15 10-15 10-15 10-15    Time 10 Minutes 10 Minutes 10 Minutes 10 Minutes      Interval Training   Interval Training No No No No      Treadmill   MPH 2.2  3.0 mph on the TM too difficult, will decrease speed to 2.2. -  -  -    Grade 1 -  -  -    Minutes 10  Pt did 5 minutes at 3.26mph then rested. -  -  -    METs 2.99 -  -  -      Bike   Level 1 - 1.4 1.7    Minutes 10 - 10 10    METs 2.69 - 3.45 3.98      NuStep   Level 3 3 4 4     SPM 80 80 85 85    Minutes 10 20 10 10     METs 1.8 2 2.3 2.3      Track   Laps  - 8 5 19     Minutes  - 10 6 6     METs  - 2.39 2.45 4.3      Home Exercise Plan   Plans to continue exercise at  -  - Home (comment) Home (comment)    Frequency  -  - Add 3 additional days to  program exercise sessions. Add 3 additional days to program exercise sessions.    Initial Home Exercises Provided  -  - 07/11/17 07/11/17       Exercise Comments:     Exercise Comments    Row Name 06/23/17 1149 07/11/17 0719 07/14/17 0654       Exercise Comments Off to a good start with exercise, adjusted workloads due to dificulty. Reviewed home exercise guidelines with patient. Reviewed goals with patient.        Exercise Goals and Review:     Exercise Goals    Row Name 06/17/17 1009             Exercise Goals   Increase Physical Activity Yes       Intervention Provide advice, education, support and counseling about physical activity/exercise needs.;Develop an individualized exercise prescription for aerobic and resistive training based on initial evaluation findings, risk stratification, comorbidities and participant's personal goals.       Expected Outcomes Achievement of increased cardiorespiratory fitness and enhanced  flexibility, muscular endurance and strength shown through measurements of functional capacity and personal statement of participant.       Increase Strength and Stamina Yes       Intervention Provide advice, education, support and counseling about physical activity/exercise needs.;Develop an individualized exercise prescription for aerobic and resistive training based on initial evaluation findings, risk stratification, comorbidities and participant's personal goals.       Expected Outcomes Achievement of increased cardiorespiratory fitness and enhanced flexibility, muscular endurance and strength shown through measurements of functional capacity and personal statement of participant.       Able to understand and use rate of perceived exertion (RPE) scale Yes       Intervention Provide education and explanation on how to use RPE scale       Expected Outcomes Short Term: Able to use RPE daily in rehab to express subjective intensity level;Long Term:  Able to use  RPE to guide intensity level when exercising independently       Knowledge and understanding of Target Heart Rate Range (THRR) Yes       Intervention Provide education and explanation of THRR including how the numbers were predicted and where they are located for reference       Expected Outcomes Short Term: Able to state/look up THRR;Long Term: Able to use THRR to govern intensity when exercising independently;Short Term: Able to use daily as guideline for intensity in rehab       Able to check pulse independently Yes       Intervention Provide education and demonstration on how to check pulse in carotid and radial arteries.;Review the importance of being able to check your own pulse for safety during independent exercise       Expected Outcomes Short Term: Able to explain why pulse checking is important during independent exercise;Long Term: Able to check pulse independently and accurately       Understanding of Exercise Prescription Yes       Intervention Provide education, explanation, and written materials on patient's individual exercise prescription       Expected Outcomes Short Term: Able to explain program exercise prescription;Long Term: Able to explain home exercise prescription to exercise independently          Exercise Goals Re-Evaluation :     Exercise Goals Re-Evaluation    Row Name 06/23/17 0800 07/11/17 0719 07/14/17 0654         Exercise Goal Re-Evaluation   Exercise Goals Review Able to understand and use rate of perceived exertion (RPE) scale;Increase Physical Activity Increase Physical Activity;Understanding of Exercise Prescription;Knowledge and understanding of Target Heart Rate Range (THRR) Increase Physical Activity     Comments Off to a good start with exercise. Decreased speed on treadmill due to difficulty. Demonstrates understanding of and ability to RPE scale. Reviewed home exercise guidelines with patient including THRR, RPE scale and stop signs for exercise.  Patient currently walking some at home, about 10 minutes when he walks. Patient is walking 15 minutes and riding stationary bike 15 minutes on the days he doesn't attend CR.     Expected Outcomes Increase workloads as tolerated. Increase walking at home from 10 minutes to 30 minutes, 3 days/week. Continue home exercise prgram riding stationary bike and walking to achieve 30 minutes of exercise on the days he doesn't attend CR.         Discharge Exercise Prescription (Final Exercise Prescription Changes):     Exercise Prescription Changes - 07/14/17 0900  Response to Exercise   Blood Pressure (Admit) 108/70   Blood Pressure (Exercise) 138/80   Blood Pressure (Exit) 112/68   Heart Rate (Admit) 94 bpm   Heart Rate (Exercise) 115 bpm   Heart Rate (Exit) 87 bpm   Rating of Perceived Exertion (Exercise) 12   Symptoms none   Duration Progress to 30 minutes of  aerobic without signs/symptoms of physical distress   Intensity THRR unchanged     Progression   Progression Continue to progress workloads to maintain intensity without signs/symptoms of physical distress.   Average METs 3.5     Resistance Training   Training Prescription Yes   Weight 5lbs   Reps 10-15   Time 10 Minutes     Interval Training   Interval Training No     Bike   Level 1.7   Minutes 10   METs 3.98     NuStep   Level 4   SPM 85   Minutes 10   METs 2.3     Track   Laps 19   Minutes 6   METs 4.3     Home Exercise Plan   Plans to continue exercise at Home (comment)   Frequency Add 3 additional days to program exercise sessions.   Initial Home Exercises Provided 07/11/17      Nutrition:  Target Goals: Understanding of nutrition guidelines, daily intake of sodium 1500mg , cholesterol 200mg , calories 30% from fat and 7% or less from saturated fats, daily to have 5 or more servings of fruits and vegetables.  Biometrics:     Pre Biometrics - 06/17/17 1009      Pre Biometrics   Waist  Circumference 47.25 inches   Hip Circumference 47.5 inches   Waist to Hip Ratio 0.99 %   Triceps Skinfold 26 mm   % Body Fat 35.5 %   Grip Strength 36 kg   Flexibility 0 in   Single Leg Stand 2.31 seconds       Nutrition Therapy Plan and Nutrition Goals:     Nutrition Therapy & Goals - 06/17/17 1529      Nutrition Therapy   Diet Therapeutic Lifestyle Changes     Personal Nutrition Goals   Nutrition Goal Pt to identify and limit food sources of saturated fat, trans fat, and sodium   Personal Goal #2 Pt to identify food quantities necessary to achieve weight loss of 6-24 lb (2.7-10.9 kg) at graduation from cardiac rehab.      Intervention Plan   Intervention Prescribe, educate and counsel regarding individualized specific dietary modifications aiming towards targeted core components such as weight, hypertension, lipid management, diabetes, heart failure and other comorbidities.   Expected Outcomes Short Term Goal: Understand basic principles of dietary content, such as calories, fat, sodium, cholesterol and nutrients.;Long Term Goal: Adherence to prescribed nutrition plan.      Nutrition Discharge: Nutrition Scores:     Nutrition Assessments - 06/17/17 1529      MEDFICTS Scores   Pre Score 55      Nutrition Goals Re-Evaluation:   Nutrition Goals Re-Evaluation:   Nutrition Goals Discharge (Final Nutrition Goals Re-Evaluation):   Psychosocial: Target Goals: Acknowledge presence or absence of significant depression and/or stress, maximize coping skills, provide positive support system. Participant is able to verbalize types and ability to use techniques and skills needed for reducing stress and depression.  Initial Review & Psychosocial Screening:     Initial Psych Review & Screening - 06/17/17 8978  Initial Review   Current issues with None Identified     Family Dynamics   Good Support System? Yes   Comments upon brief assessment, no psychosocial needs  identified, no interventions necessary      Barriers   Psychosocial barriers to participate in program There are no identifiable barriers or psychosocial needs.     Screening Interventions   Interventions Encouraged to exercise;To provide support and resources with identified psychosocial needs      Quality of Life Scores:     Quality of Life - 06/17/17 0923      Quality of Life Scores   Health/Function Pre 17.67 %   Socioeconomic Pre 24.38 %   Psych/Spiritual Pre 19.5 %   Family Pre 28.8 %   GLOBAL Pre 21.16 %      PHQ-9: Recent Review Flowsheet Data    Depression screen [facility] 2/9 06/23/2017   Decreased Interest 0   Down, Depressed, Hopeless 0   PHQ - 2 Score 0     Interpretation of Total Score  Total Score Depression Severity:  1-4 = Minimal depression, 5-9 = Mild depression, 10-14 = Moderate depression, 15-19 = Moderately severe depression, 20-27 = Severe depression   Psychosocial Evaluation and Intervention:     Psychosocial Evaluation - 06/23/17 0744      Psychosocial Evaluation & Interventions   Interventions Encouraged to exercise with the program and follow exercise prescription   Comments no psychosocial needs identified, no interventions necessary.  pt enjoys spending time with his family.    Expected Outcomes pt will exhibit positive outlook with good coping skills.    Continue Psychosocial Services  No Follow up required      Psychosocial Re-Evaluation:     Psychosocial Re-Evaluation    Row Name 06/24/17 1510 07/15/17 0735           Psychosocial Re-Evaluation   Current issues with None Identified None Identified      Comments no psychosocial needs identified, no interventions necessary .  no psychosocial needs identified, no interventions necessary .       Expected Outcomes pt will exhibit positive outlook with good coping skills.  pt will exhibit positive outlook with good coping skills.       Interventions Encouraged to attend Cardiac  Rehabilitation for the exercise;Stress management education;Relaxation education Encouraged to attend Cardiac Rehabilitation for the exercise;Stress management education;Relaxation education      Continue Psychosocial Services  No Follow up required No Follow up required         Psychosocial Discharge (Final Psychosocial Re-Evaluation):     Psychosocial Re-Evaluation - 07/15/17 0735      Psychosocial Re-Evaluation   Current issues with None Identified   Comments no psychosocial needs identified, no interventions necessary .    Expected Outcomes pt will exhibit positive outlook with good coping skills.    Interventions Encouraged to attend Cardiac Rehabilitation for the exercise;Stress management education;Relaxation education   Continue Psychosocial Services  No Follow up required      Vocational Rehabilitation: Provide vocational rehab assistance to qualifying candidates.   Vocational Rehab Evaluation & Intervention:     Vocational Rehab - 06/17/17 [phone]      Initial Vocational Rehab Evaluation & Intervention   Assessment shows need for Vocational Rehabilitation No      Education: Education Goals: Education classes will be provided on a weekly basis, covering required topics. Participant will state understanding/return demonstration of topics presented.  Learning Barriers/Preferences:     Learning Barriers/Preferences -  06/17/17 1015      Learning Barriers/Preferences   Learning Preferences Skilled Demonstration;Verbal Instruction;Video;Audio      Education Topics: Count Your Pulse:  -Group instruction provided by verbal instruction, demonstration, patient participation and written materials to support subject.  Instructors address importance of being able to find your pulse and how to count your pulse when at home without a heart monitor.  Patients get hands on experience counting their pulse with staff help and individually.   Heart Attack, Angina, and Risk Factor  Modification:  -Group instruction provided by verbal instruction, video, and written materials to support subject.  Instructors address signs and symptoms of angina and heart attacks.    Also discuss risk factors for heart disease and how to make changes to improve heart health risk factors.   Functional Fitness:  -Group instruction provided by verbal instruction, demonstration, patient participation, and written materials to support subject.  Instructors address safety measures for doing things around the house.  Discuss how to get up and down off the floor, how to pick things up properly, how to safely get out of a chair without assistance, and balance training.   Meditation and Mindfulness:  -Group instruction provided by verbal instruction, patient participation, and written materials to support subject.  Instructor addresses importance of mindfulness and meditation practice to help reduce stress and improve awareness.  Instructor also leads participants through a meditation exercise.    Stretching for Flexibility and Mobility:  -Group instruction provided by verbal instruction, patient participation, and written materials to support subject.  Instructors lead participants through series of stretches that are designed to increase flexibility thus improving mobility.  These stretches are additional exercise for major muscle groups that are typically performed during regular warm up and cool down.   Hands Only CPR:  -Group verbal, video, and participation provides a basic overview of AHA guidelines for community CPR. Role-play of emergencies allow participants the opportunity to practice calling for help and chest compression technique with discussion of AED use.   Hypertension: -Group verbal and written instruction that provides a basic overview of hypertension including the most recent diagnostic guidelines, risk factor reduction with self-care instructions and medication  management.    Nutrition I class: Heart Healthy Eating:  -Group instruction provided by PowerPoint slides, verbal discussion, and written materials to support subject matter. The instructor gives an explanation and review of the Therapeutic Lifestyle Changes diet recommendations, which includes a discussion on lipid goals, dietary fat, sodium, fiber, plant stanol/sterol esters, sugar, and the components of a well-balanced, healthy diet.   Nutrition II class: Lifestyle Skills:  -Group instruction provided by PowerPoint slides, verbal discussion, and written materials to support subject matter. The instructor gives an explanation and review of label reading, grocery shopping for heart health, heart healthy recipe modifications, and ways to make healthier choices when eating out.   Diabetes Question & Answer:  -Group instruction provided by PowerPoint slides, verbal discussion, and written materials to support subject matter. The instructor gives an explanation and review of diabetes co-morbidities, pre- and post-prandial blood glucose goals, pre-exercise blood glucose goals, signs, symptoms, and treatment of hypoglycemia and hyperglycemia, and foot care basics.   Diabetes Blitz:  -Group instruction provided by PowerPoint slides, verbal discussion, and written materials to support subject matter. The instructor gives an explanation and review of the physiology behind type 1 and type 2 diabetes, diabetes medications and rational behind using different medications, pre- and post-prandial blood glucose recommendations and Hemoglobin A1c goals, diabetes diet,  and exercise including blood glucose guidelines for exercising safely.    Portion Distortion:  -Group instruction provided by PowerPoint slides, verbal discussion, written materials, and food models to support subject matter. The instructor gives an explanation of serving size versus portion size, changes in portions sizes over the last 20 years,  and what consists of a serving from each food group.   Stress Management:  -Group instruction provided by verbal instruction, video, and written materials to support subject matter.  Instructors review role of stress in heart disease and how to cope with stress positively.     Exercising on Your Own:  -Group instruction provided by verbal instruction, power point, and written materials to support subject.  Instructors discuss benefits of exercise, components of exercise, frequency and intensity of exercise, and end points for exercise.  Also discuss use of nitroglycerin and activating EMS.  Review options of places to exercise outside of rehab.  Review guidelines for sex with heart disease.   Cardiac Drugs I:  -Group instruction provided by verbal instruction and written materials to support subject.  Instructor reviews cardiac drug classes: antiplatelets, anticoagulants, beta blockers, and statins.  Instructor discusses reasons, side effects, and lifestyle considerations for each drug class.   Cardiac Drugs II:  -Group instruction provided by verbal instruction and written materials to support subject.  Instructor reviews cardiac drug classes: angiotensin converting enzyme inhibitors (ACE-I), angiotensin II receptor blockers (ARBs), nitrates, and calcium channel blockers.  Instructor discusses reasons, side effects, and lifestyle considerations for each drug class.   Anatomy and Physiology of the Circulatory System:  Group verbal and written instruction and models provide basic cardiac anatomy and physiology, with the coronary electrical and arterial systems. Review of: AMI, Angina, Valve disease, Heart Failure, Peripheral Artery Disease, Cardiac Arrhythmia, Pacemakers, and the ICD.   Other Education:  -Group or individual verbal, written, or video instructions that support the educational goals of the cardiac rehab program.   Knowledge Questionnaire Score:     Knowledge Questionnaire  Score - 06/17/17 0923      Knowledge Questionnaire Score   Pre Score 20/24      Core Components/Risk Factors/Patient Goals at Admission:     Personal Goals and Risk Factors at Admission - 06/23/17 0744      Core Components/Risk Factors/Patient Goals on Admission    Weight Management Yes;Weight Maintenance;Obesity;Weight Loss   Intervention Weight Management: Develop a combined nutrition and exercise program designed to reach desired caloric intake, while maintaining appropriate intake of nutrient and fiber, sodium and fats, and appropriate energy expenditure required for the weight goal.   Admit Weight 246 lb 11.1 oz (111.9 kg)   Goal Weight: Short Term 236 lb (107 kg)   Goal Weight: Long Term 206 lb (93.4 kg)   Expected Outcomes Short Term: Continue to assess and modify interventions until short term weight is achieved;Long Term: Adherence to nutrition and physical activity/exercise program aimed toward attainment of established weight goal;Weight Maintenance: Understanding of the daily nutrition guidelines, which includes 25-35% calories from fat, 7% or less cal from saturated fats, less than 200mg  cholesterol, less than 1.5gm of sodium, & 5 or more servings of fruits and vegetables daily;Weight Loss: Understanding of general recommendations for a balanced deficit meal plan, which promotes 1-2 lb weight loss per week and includes a negative energy balance of [phone] kcal/d;Understanding recommendations for meals to include 15-35% energy as protein, 25-35% energy from fat, 35-60% energy from carbohydrates, less than 200mg  of dietary cholesterol, 20-35 gm of total  fiber daily;Understanding of distribution of calorie intake throughout the day with the consumption of 4-5 meals/snacks   Hypertension Yes   Intervention Monitor prescription use compliance.;Provide education on lifestyle modifcations including regular physical activity/exercise, weight management, moderate sodium restriction and  increased consumption of fresh fruit, vegetables, and low fat dairy, alcohol moderation, and smoking cessation.   Expected Outcomes Short Term: Continued assessment and intervention until BP is < 140/47mm HG in hypertensive participants. < 130/39mm HG in hypertensive participants with diabetes, heart failure or chronic kidney disease.;Long Term: Maintenance of blood pressure at goal levels.   Personal Goal Other Yes   Personal Goal pt personal goals are to increase EF, lose weight, decrease fear of activity to develop exercise regimen.    Intervention Provide CR exercise, nutrition and lifestyle  modification education opportunities.   Expected Outcomes pt will participate in CR exercise, nutrition and lifestyle modification education       Core Components/Risk Factors/Patient Goals Review:      Goals and Risk Factor Review    Row Name 06/24/17 1509 07/15/17 0735           Core Components/Risk Factors/Patient Goals Review   Personal Goals Review Weight Management/Obesity;Hypertension;Other Weight Management/Obesity;Hypertension;Other      Review pt with multiple CAD RF demonstrates eagerness to participate in CR program.  pt with multiple CAD RF demonstrates eagerness to participate in CR program.  pt demonstrates decreased dyspnea and fatigue with exertion       Expected Outcomes pt will participate in CR exercise, nutrition and lifestyle modification offerings to decrease overall RF.  pt will participate in CR exercise, nutrition and lifestyle modification offerings to decrease overall RF.          Core Components/Risk Factors/Patient Goals at Discharge (Final Review):      Goals and Risk Factor Review - 07/15/17 0735      Core Components/Risk Factors/Patient Goals Review   Personal Goals Review Weight Management/Obesity;Hypertension;Other   Review pt with multiple CAD RF demonstrates eagerness to participate in CR program.  pt demonstrates decreased dyspnea and fatigue with  exertion    Expected Outcomes pt will participate in CR exercise, nutrition and lifestyle modification offerings to decrease overall RF.       ITP Comments:     ITP Comments    Row Name 06/17/17 0958 06/24/17 1507 07/15/17 0735       ITP Comments Medical Director, Dr. Armanda Magic 30 day ITP review.  Pt new this week  to group exercise sessions.   No change in current regimen unless directed by Medical Director.   30 day ITP review.   No change in current regimen unless directed by Medical Director.          Comments:

## 2017-07-21 ENCOUNTER — Encounter (HOSPITAL_COMMUNITY)
Admission: RE | Admit: 2017-07-21 | Discharge: 2017-07-21 | Disposition: A | Discharge status: Home / Self Care | Payer: 59 | Source: Ambulatory Visit | Attending: Cardiology | Admitting: Cardiology

## 2017-07-21 ENCOUNTER — Encounter (HOSPITAL_COMMUNITY): Payer: 59

## 2017-07-21 DIAGNOSIS — I429 Cardiomyopathy, unspecified: Secondary | ICD-10-CM | POA: Diagnosis not present

## 2017-07-21 DIAGNOSIS — I5022 Chronic systolic (congestive) heart failure: Secondary | ICD-10-CM

## 2017-07-23 ENCOUNTER — Encounter (HOSPITAL_COMMUNITY): Payer: 59

## 2017-07-23 ENCOUNTER — Encounter (HOSPITAL_COMMUNITY)
Admission: RE | Admit: 2017-07-23 | Discharge: 2017-07-23 | Disposition: A | Discharge status: Home / Self Care | Payer: 59 | Source: Ambulatory Visit | Attending: Cardiology | Admitting: Cardiology

## 2017-07-23 DIAGNOSIS — I5022 Chronic systolic (congestive) heart failure: Secondary | ICD-10-CM

## 2017-07-23 DIAGNOSIS — I429 Cardiomyopathy, unspecified: Secondary | ICD-10-CM | POA: Diagnosis not present

## 2017-07-25 ENCOUNTER — Encounter (HOSPITAL_COMMUNITY)
Admission: RE | Admit: 2017-07-25 | Discharge: 2017-07-25 | Disposition: A | Discharge status: Home / Self Care | Payer: 59 | Source: Ambulatory Visit | Attending: Cardiology | Admitting: Cardiology

## 2017-07-25 ENCOUNTER — Encounter (HOSPITAL_COMMUNITY): Payer: 59

## 2017-07-25 DIAGNOSIS — I5022 Chronic systolic (congestive) heart failure: Secondary | ICD-10-CM

## 2017-07-25 DIAGNOSIS — I429 Cardiomyopathy, unspecified: Secondary | ICD-10-CM | POA: Diagnosis not present

## 2017-07-28 ENCOUNTER — Encounter (HOSPITAL_COMMUNITY): Payer: 59

## 2017-07-30 ENCOUNTER — Encounter (HOSPITAL_COMMUNITY): Payer: 59

## 2017-08-01 ENCOUNTER — Encounter (HOSPITAL_COMMUNITY)
Admission: RE | Admit: 2017-08-01 | Discharge: 2017-08-01 | Disposition: A | Discharge status: Home / Self Care | Payer: 59 | Source: Ambulatory Visit | Attending: Cardiology | Admitting: Cardiology

## 2017-08-01 ENCOUNTER — Encounter (HOSPITAL_COMMUNITY): Payer: 59

## 2017-08-01 DIAGNOSIS — I5022 Chronic systolic (congestive) heart failure: Secondary | ICD-10-CM

## 2017-08-04 ENCOUNTER — Encounter (HOSPITAL_COMMUNITY): Payer: 59

## 2017-08-04 ENCOUNTER — Encounter (HOSPITAL_COMMUNITY)
Admission: RE | Admit: 2017-08-04 | Discharge: 2017-08-04 | Disposition: A | Discharge status: Home / Self Care | Payer: 59 | Source: Ambulatory Visit | Attending: Cardiology | Admitting: Cardiology

## 2017-08-04 DIAGNOSIS — I5022 Chronic systolic (congestive) heart failure: Secondary | ICD-10-CM

## 2017-08-04 DIAGNOSIS — I429 Cardiomyopathy, unspecified: Secondary | ICD-10-CM | POA: Diagnosis not present

## 2017-08-06 ENCOUNTER — Encounter (HOSPITAL_COMMUNITY)
Admission: RE | Admit: 2017-08-06 | Discharge: 2017-08-06 | Disposition: A | Discharge status: Home / Self Care | Payer: 59 | Source: Ambulatory Visit | Attending: Cardiology | Admitting: Cardiology

## 2017-08-06 ENCOUNTER — Encounter (HOSPITAL_COMMUNITY): Payer: 59

## 2017-08-06 DIAGNOSIS — I5022 Chronic systolic (congestive) heart failure: Secondary | ICD-10-CM

## 2017-08-06 DIAGNOSIS — I429 Cardiomyopathy, unspecified: Secondary | ICD-10-CM | POA: Diagnosis not present

## 2017-08-08 ENCOUNTER — Encounter (HOSPITAL_COMMUNITY): Payer: 59

## 2017-08-11 ENCOUNTER — Encounter (HOSPITAL_COMMUNITY): Payer: 59

## 2017-08-13 ENCOUNTER — Encounter (HOSPITAL_COMMUNITY)
Admission: RE | Admit: 2017-08-13 | Discharge: 2017-08-13 | Disposition: A | Discharge status: Home / Self Care | Payer: 59 | Source: Ambulatory Visit | Attending: Cardiology | Admitting: Cardiology

## 2017-08-13 ENCOUNTER — Encounter (HOSPITAL_COMMUNITY): Payer: 59

## 2017-08-13 ENCOUNTER — Encounter (HOSPITAL_COMMUNITY): Admission: RE | Admit: 2017-08-13 | Payer: 59 | Source: Ambulatory Visit

## 2017-08-13 DIAGNOSIS — I429 Cardiomyopathy, unspecified: Secondary | ICD-10-CM | POA: Diagnosis not present

## 2017-08-13 DIAGNOSIS — I5022 Chronic systolic (congestive) heart failure: Secondary | ICD-10-CM

## 2017-08-15 ENCOUNTER — Encounter (HOSPITAL_COMMUNITY): Payer: 59

## 2017-08-15 ENCOUNTER — Encounter (HOSPITAL_COMMUNITY): Payer: Self-pay

## 2017-08-15 ENCOUNTER — Encounter (HOSPITAL_COMMUNITY)
Admission: RE | Admit: 2017-08-15 | Discharge: 2017-08-15 | Disposition: A | Discharge status: Home / Self Care | Payer: 59 | Source: Ambulatory Visit | Attending: Cardiology | Admitting: Cardiology

## 2017-08-15 DIAGNOSIS — I429 Cardiomyopathy, unspecified: Secondary | ICD-10-CM | POA: Diagnosis not present

## 2017-08-15 DIAGNOSIS — I5022 Chronic systolic (congestive) heart failure: Secondary | ICD-10-CM

## 2017-08-15 NOTE — Progress Notes (Signed)
Cardiac Individual Treatment Plan  Patient Details  Name: John [name] MRN: [id number] Date of Birth: [birth year] Referring Provider:     CARDIAC REHAB PHASE II ORIENTATION from 06/17/2017 in [facility] CARDIAC REHAB  Referring Provider  Nanetta Batty MD      Initial Encounter Date:    CARDIAC REHAB PHASE II ORIENTATION from 06/17/2017 in [facility] CARDIAC REHAB  Date  06/17/17  Referring Provider  Nanetta Batty MD      Visit Diagnosis: Heart failure, chronic systolic (HCC)  Patient's Home Medications on Admission:  Current Outpatient Medications:  .  amoxicillin (AMOXIL) 500 MG capsule, Take 4 capsules by mouth once. Prior to dental appt this afternoon, Disp: , Rfl:  .  aspirin 81 MG chewable tablet, Chew 1 tablet (81 mg total) by mouth daily., Disp: , Rfl:  .  buPROPion (WELLBUTRIN XL) 150 MG 24 hr tablet, Take 1 tablet by mouth daily., Disp: , Rfl:  .  carvedilol (COREG) 6.25 MG tablet, Take 12.5 mg by mouth 2 (two) times daily., Disp: , Rfl:  .  escitalopram (LEXAPRO) 10 MG tablet, TAKE 0.5 TABLET (5 MG TOTAL) BY MOUTH DAILY., Disp: , Rfl: 2 .  fluticasone (FLONASE) 50 MCG/ACT nasal spray, Place 2 sprays into both nostrils daily., Disp: 16 g, Rfl: 3 .  furosemide (LASIX) 40 MG tablet, Take 1 tablet (40 mg total) by mouth daily., Disp: 90 tablet, Rfl: 3 .  LORazepam (ATIVAN) 1 MG tablet, Take 0.5-1 tablets (0.5-1 mg total) by mouth 3 (three) times daily as needed for anxiety or sleep., Disp: 30 tablet, Rfl: 0 .  losartan (COZAAR) 25 MG tablet, Take 1 tablet by mouth daily., Disp: , Rfl:  .  Multiple Vitamins-Minerals (MULTIVITAMIN WITH MINERALS) tablet, Take 1 tablet by mouth daily., Disp: , Rfl:  .  potassium chloride SA (K-DUR,KLOR-CON) 20 MEQ tablet, Take 1 tablet (20 mEq total) by mouth daily., Disp: 90 tablet, Rfl: 3  Past Medical History: Past Medical History:  Diagnosis Date  . Anxiety    Ativan  . Chronic combined systolic  and diastolic CHF, NYHA class 3 (HCC) 10/2016   Nonischemic cardiomyopathy. EF 20-25%.  . Chronic left hip pain   . Depression    history of  . GI bleed 03/2016  . Headache   . Hypertensive heart disease with combined systolic and diastolic congestive heart failure (HCC) 10/2016  . Nonischemic cardiomyopathy (HCC) 10/2016   Echo with EF 20-25%. Cardiac catheterization with no CAD. LVEDP was 41 mmHg, PCWP 36 mmHg  . Osteoarthritis    right shoulder  . Right hand fracture   . Seasonal allergies   . Sleep apnea    wears a CPAP  . Wears glasses     Tobacco Use: Social History   Tobacco Use  Smoking Status Never Smoker  Smokeless Tobacco Never Used    Labs: Recent Review Flowsheet Data    Labs for ITP Cardiac and Pulmonary Rehab Latest Ref Rng & Units 10/21/2016 10/21/2016   PHART 7.350 - 7.450 - 7.427   PCO2ART 32.0 - 48.0 mmHg - 41.7   HCO3 20.0 - 28.0 mmol/L 29.3(H) 27.5   TCO2 0 - 100 mmol/L 31 29   O2SAT % 61.0 99.0      Capillary Blood Glucose: No results found for: GLUCAP   Exercise Target Goals:    Exercise Program Goal: Individual exercise prescription set with THRR, safety & activity barriers. Participant demonstrates ability to understand and report RPE using BORG  scale, to self-measure pulse accurately, and to acknowledge the importance of the exercise prescription.  Exercise Prescription Goal: Starting with aerobic activity 30 plus minutes a day, 3 days per week for initial exercise prescription. Provide home exercise prescription and guidelines that participant acknowledges understanding prior to discharge.  Activity Barriers & Risk Stratification: Activity Barriers & Cardiac Risk Stratification - 06/17/17 1004      Activity Barriers & Cardiac Risk Stratification   Activity Barriers  Left Hip Replacement;Other (comment)    Comments  R shoulder replacement    Cardiac Risk Stratification  High       6 Minute Walk: 6 Minute Walk    Row Name 06/17/17  1030         6 Minute Walk   Phase  Initial     Distance  1769 feet     Distance Feet Change  1769 ft     Walk Time  6 minutes     # of Rest Breaks  0     MPH  3.4     METS  4.28     RPE  13     VO2 Peak  14.98     Symptoms  Yes (comment)     Comments  fatigue/slight breathlessness     Resting HR  82 bpm     Resting BP  114/64     Resting Oxygen Saturation   100 %     Exercise Oxygen Saturation  during 6 min walk  97 %     Max Ex. HR  121 bpm     Max Ex. BP  136/72     2 Minute Post BP  120/72        Oxygen Initial Assessment:   Oxygen Re-Evaluation:   Oxygen Discharge (Final Oxygen Re-Evaluation):   Initial Exercise Prescription: Initial Exercise Prescription - 06/17/17 1000      Date of Initial Exercise RX and Referring Provider   Date  06/17/17    Referring Provider  Nanetta Batty MD      Treadmill   MPH  3    Grade  1    Minutes  10    METs  3.71      Bike   Level  1    Minutes  10    METs  2.72      NuStep   Level  4    SPM  80    Minutes  10    METs  3      Prescription Details   Frequency (times per week)  3    Duration  Progress to 30 minutes of continuous aerobic without signs/symptoms of physical distress      Intensity   THRR 40-80% of Max Heartrate  66-132    Ratings of Perceived Exertion  11-15    Perceived Dyspnea  0-4      Progression   Progression  Continue to progress workloads to maintain intensity without signs/symptoms of physical distress.      Resistance Training   Training Prescription  Yes    Weight  5lbs    Reps  10-15       Perform Capillary Blood Glucose checks as needed.  Exercise Prescription Changes: Exercise Prescription Changes    Row Name 06/23/17 1100 07/04/17 1500 07/11/17 0800 07/14/17 0900 08/04/17 0659     Response to Exercise   Blood Pressure (Admit)  112/78  128/62  118/78  108/70  108/75   Blood Pressure (  Exercise)  128/78  102/78  118/66  138/80  128/78   Blood Pressure (Exit)  98/60   100/78  110/68  112/68  120/80   Heart Rate (Admit)  91 bpm  89 bpm  97 bpm  94 bpm  97 bpm   Heart Rate (Exercise)  108 bpm  110 bpm  109 bpm  115 bpm  114 bpm   Heart Rate (Exit)  88 bpm  69 bpm  85 bpm  87 bpm  77 bpm   Rating of Perceived Exertion (Exercise)  15  12  12  12  14    Symptoms  none  none  none  none  none   Comments  Decresed workload on TM due to dificulty.  -  -  -  -   Duration  Progress to 30 minutes of  aerobic without signs/symptoms of physical distress  Progress to 30 minutes of  aerobic without signs/symptoms of physical distress  Progress to 30 minutes of  aerobic without signs/symptoms of physical distress  Progress to 30 minutes of  aerobic without signs/symptoms of physical distress  Progress to 30 minutes of  aerobic without signs/symptoms of physical distress   Intensity  THRR unchanged  THRR unchanged  THRR unchanged  THRR unchanged  THRR unchanged     Progression   Progression  Continue to progress workloads to maintain intensity without signs/symptoms of physical distress.  Continue to progress workloads to maintain intensity without signs/symptoms of physical distress.  Continue to progress workloads to maintain intensity without signs/symptoms of physical distress.  Continue to progress workloads to maintain intensity without signs/symptoms of physical distress.  Continue to progress workloads to maintain intensity without signs/symptoms of physical distress.   Average METs  2.5  2.5  2.7  3.5  3.7     Resistance Training   Training Prescription  Yes  Yes  Yes  Yes  No Out early   Weight  2lbs  3lbs  3lbs  5lbs  -   Reps  10-15  10-15  10-15  10-15  -   Time  10 Minutes  10 Minutes  10 Minutes  10 Minutes  -     Interval Training   Interval Training  No  No  No  No  No     Treadmill   MPH  2.2 3.0 mph on the TM too difficult, will decrease speed to 2.2.  -  -  -  -   Grade  1  -  -  -  -   Minutes  10 Pt did 5 minutes at 3.73mph then rested.  -  -  -  -    METs  2.99  -  -  -  -     Bike   Level  1  -  1.4  1.7  1.7   Minutes  10  -  10  10  10    METs  2.69  -  3.45  3.98  4.01     NuStep   Level  3  3  4  4  5    SPM  80  80  85  85  85   Minutes  10  20  10  10  10    METs  1.8  2  2.3  2.3  3.6     Track   Laps  -  8  5  19  14    Minutes  -  10  6  6  6   METs  -  2.39  2.45  4.3  3.43     Home Exercise Plan   Plans to continue exercise at  -  -  Home (comment)  Home (comment)  Home (comment)   Frequency  -  -  Add 3 additional days to program exercise sessions.  Add 3 additional days to program exercise sessions.  Add 3 additional days to program exercise sessions.   Initial Home Exercises Provided  -  -  07/11/17  07/11/17  07/11/17   Row Name 08/13/17 0700             Response to Exercise   Blood Pressure (Admit)  130/80       Blood Pressure (Exercise)  140/94       Blood Pressure (Exit)  114/72       Heart Rate (Admit)  100 bpm       Heart Rate (Exercise)  119 bpm       Heart Rate (Exit)  80 bpm       Rating of Perceived Exertion (Exercise)  12       Symptoms  none       Duration  Progress to 30 minutes of  aerobic without signs/symptoms of physical distress       Intensity  THRR unchanged         Progression   Progression  Continue to progress workloads to maintain intensity without signs/symptoms of physical distress.       Average METs  3.3         Resistance Training   Training Prescription  No Relaxation today         Interval Training   Interval Training  No         Bike   Level  1.7       Minutes  10       METs  3.98         NuStep   Level  5       SPM  85       Minutes  10       METs  2.9         Track   Laps  11       Minutes  6       METs  2.91         Home Exercise Plan   Plans to continue exercise at  Home (comment)       Frequency  Add 3 additional days to program exercise sessions.       Initial Home Exercises Provided  07/11/17          Exercise Comments: Exercise Comments     Row Name 06/23/17 1149 07/11/17 0719 07/14/17 0654 08/01/17 0721 08/13/17 0723   Exercise Comments  Off to a good start with exercise, adjusted workloads due to dificulty.  Reviewed home exercise guidelines with patient.  Reviewed goals with patient.  Reviewed METs and goals with patient.  Reviewed METs with patient.      Exercise Goals and Review: Exercise Goals    Row Name 06/17/17 1009             Exercise Goals   Increase Physical Activity  Yes       Intervention  Provide advice, education, support and counseling about physical activity/exercise needs.;Develop an individualized exercise prescription for aerobic and resistive training based on initial evaluation findings, risk stratification, comorbidities and participant's  personal goals.       Expected Outcomes  Achievement of increased cardiorespiratory fitness and enhanced flexibility, muscular endurance and strength shown through measurements of functional capacity and personal statement of participant.       Increase Strength and Stamina  Yes       Intervention  Provide advice, education, support and counseling about physical activity/exercise needs.;Develop an individualized exercise prescription for aerobic and resistive training based on initial evaluation findings, risk stratification, comorbidities and participant's personal goals.       Expected Outcomes  Achievement of increased cardiorespiratory fitness and enhanced flexibility, muscular endurance and strength shown through measurements of functional capacity and personal statement of participant.       Able to understand and use rate of perceived exertion (RPE) scale  Yes       Intervention  Provide education and explanation on how to use RPE scale       Expected Outcomes  Short Term: Able to use RPE daily in rehab to express subjective intensity level;Long Term:  Able to use RPE to guide intensity level when exercising independently       Knowledge and understanding of  Target Heart Rate Range (THRR)  Yes       Intervention  Provide education and explanation of THRR including how the numbers were predicted and where they are located for reference       Expected Outcomes  Short Term: Able to state/look up THRR;Long Term: Able to use THRR to govern intensity when exercising independently;Short Term: Able to use daily as guideline for intensity in rehab       Able to check pulse independently  Yes       Intervention  Provide education and demonstration on how to check pulse in carotid and radial arteries.;Review the importance of being able to check your own pulse for safety during independent exercise       Expected Outcomes  Short Term: Able to explain why pulse checking is important during independent exercise;Long Term: Able to check pulse independently and accurately       Understanding of Exercise Prescription  Yes       Intervention  Provide education, explanation, and written materials on patient's individual exercise prescription       Expected Outcomes  Short Term: Able to explain program exercise prescription;Long Term: Able to explain home exercise prescription to exercise independently          Exercise Goals Re-Evaluation : Exercise Goals Re-Evaluation    Row Name 06/23/17 0800 07/11/17 0719 07/14/17 0654 08/04/17 0740       Exercise Goal Re-Evaluation   Exercise Goals Review  Able to understand and use rate of perceived exertion (RPE) scale;Increase Physical Activity  Increase Physical Activity;Understanding of Exercise Prescription;Knowledge and understanding of Target Heart Rate Range (THRR)  Increase Physical Activity  Increase Physical Activity;Knowledge and understanding of Target Heart Rate Range (THRR)    Comments  Off to a good start with exercise. Decreased speed on treadmill due to difficulty. Demonstrates understanding of and ability to RPE scale.  Reviewed home exercise guidelines with patient including THRR, RPE scale and stop signs for  exercise. Patient currently walking some at home, about 10 minutes when he walks.  Patient is walking 15 minutes and riding stationary bike 15 minutes on the days he doesn't attend CR.  Patient is doing well with exercise at cardiac rehab and walking and riding his stationary bike at home. Pt c/o some neck pain today. Reviewed  target heart rate range with patient.    Expected Outcomes  Increase workloads as tolerated.  Increase walking at home from 10 minutes to 30 minutes, 3 days/week.  Continue home exercise prgram riding stationary bike and walking to achieve 30 minutes of exercise on the days he doesn't attend CR.  Continue current exercise prescription 30 minutes at least 5 days/week.        Discharge Exercise Prescription (Final Exercise Prescription Changes): Exercise Prescription Changes - 08/13/17 0700      Response to Exercise   Blood Pressure (Admit)  130/80    Blood Pressure (Exercise)  140/94    Blood Pressure (Exit)  114/72    Heart Rate (Admit)  100 bpm    Heart Rate (Exercise)  119 bpm    Heart Rate (Exit)  80 bpm    Rating of Perceived Exertion (Exercise)  12    Symptoms  none    Duration  Progress to 30 minutes of  aerobic without signs/symptoms of physical distress    Intensity  THRR unchanged      Progression   Progression  Continue to progress workloads to maintain intensity without signs/symptoms of physical distress.    Average METs  3.3      Resistance Training   Training Prescription  No Relaxation today      Interval Training   Interval Training  No      Bike   Level  1.7    Minutes  10    METs  3.98      NuStep   Level  5    SPM  85    Minutes  10    METs  2.9      Track   Laps  11    Minutes  6    METs  2.91      Home Exercise Plan   Plans to continue exercise at  Home (comment)    Frequency  Add 3 additional days to program exercise sessions.    Initial Home Exercises Provided  07/11/17       Nutrition:  Target Goals: Understanding  of nutrition guidelines, daily intake of sodium 1500mg , cholesterol 200mg , calories 30% from fat and 7% or less from saturated fats, daily to have 5 or more servings of fruits and vegetables.  Biometrics: Pre Biometrics - 06/17/17 1009      Pre Biometrics   Waist Circumference  47.25 inches    Hip Circumference  47.5 inches    Waist to Hip Ratio  0.99 %    Triceps Skinfold  26 mm    % Body Fat  35.5 %    Grip Strength  36 kg    Flexibility  0 in    Single Leg Stand  2.31 seconds        Nutrition Therapy Plan and Nutrition Goals: Nutrition Therapy & Goals - 06/17/17 1529      Nutrition Therapy   Diet  Therapeutic Lifestyle Changes      Personal Nutrition Goals   Nutrition Goal  Pt to identify and limit food sources of saturated fat, trans fat, and sodium    Personal Goal #2  Pt to identify food quantities necessary to achieve weight loss of 6-24 lb (2.7-10.9 kg) at graduation from cardiac rehab.       Intervention Plan   Intervention  Prescribe, educate and counsel regarding individualized specific dietary modifications aiming towards targeted core components such as weight, hypertension, lipid management, diabetes, heart failure  and other comorbidities.    Expected Outcomes  Short Term Goal: Understand basic principles of dietary content, such as calories, fat, sodium, cholesterol and nutrients.;Long Term Goal: Adherence to prescribed nutrition plan.       Nutrition Discharge: Nutrition Scores: Nutrition Assessments - 06/17/17 1529      MEDFICTS Scores   Pre Score  55       Nutrition Goals Re-Evaluation:   Nutrition Goals Re-Evaluation:   Nutrition Goals Discharge (Final Nutrition Goals Re-Evaluation):   Psychosocial: Target Goals: Acknowledge presence or absence of significant depression and/or stress, maximize coping skills, provide positive support system. Participant is able to verbalize types and ability to use techniques and skills needed for reducing  stress and depression.  Initial Review & Psychosocial Screening: Initial Psych Review & Screening - 06/17/17 0924      Initial Review   Current issues with  None Identified      Family Dynamics   Good Support System?  Yes    Comments  upon brief assessment, no psychosocial needs identified, no interventions necessary       Barriers   Psychosocial barriers to participate in program  There are no identifiable barriers or psychosocial needs.      Screening Interventions   Interventions  Encouraged to exercise;To provide support and resources with identified psychosocial needs       Quality of Life Scores: Quality of Life - 08/01/17 1037      Quality of Life Scores   Health/Function Pre  17.67 % pt with significant fatigue from recent cardiac event notes improvement with cardiac rehab activities. pt demonstrates eagerness to make lifestyle modifications to decrease CAD risk factors.      Socioeconomic Pre  24.38 % pt verbalizes high stress career     Psych/Spiritual Pre  19.5 %    Family Pre  28.8 %    GLOBAL Pre  21.16 % overall pt verbalizes positive outlook with good coping skills.        PHQ-9: Recent Review Flowsheet Data    Depression screen [facility] 2/9 06/23/2017   Decreased Interest 0   Down, Depressed, Hopeless 0   PHQ - 2 Score 0     Interpretation of Total Score  Total Score Depression Severity:  1-4 = Minimal depression, 5-9 = Mild depression, 10-14 = Moderate depression, 15-19 = Moderately severe depression, 20-27 = Severe depression   Psychosocial Evaluation and Intervention: Psychosocial Evaluation - 06/23/17 0744      Psychosocial Evaluation & Interventions   Interventions  Encouraged to exercise with the program and follow exercise prescription    Comments  no psychosocial needs identified, no interventions necessary.  pt enjoys spending time with his family.     Expected Outcomes  pt will exhibit positive outlook with good coping skills.     Continue  Psychosocial Services   No Follow up required       Psychosocial Re-Evaluation: Psychosocial Re-Evaluation    Row Name 06/24/17 1510 07/15/17 0735 08/13/17 0740         Psychosocial Re-Evaluation   Current issues with  None Identified  None Identified  None Identified     Comments  no psychosocial needs identified, no interventions necessary .   no psychosocial needs identified, no interventions necessary .   no psychosocial needs identified, no interventions necessary .      Expected Outcomes  pt will exhibit positive outlook with good coping skills.   pt will exhibit positive outlook with good coping skills.  pt will exhibit positive outlook with good coping skills.      Interventions  Encouraged to attend Cardiac Rehabilitation for the exercise;Stress management education;Relaxation education  Encouraged to attend Cardiac Rehabilitation for the exercise;Stress management education;Relaxation education  Encouraged to attend Cardiac Rehabilitation for the exercise;Stress management education;Relaxation education     Continue Psychosocial Services   No Follow up required  No Follow up required  No Follow up required        Psychosocial Discharge (Final Psychosocial Re-Evaluation): Psychosocial Re-Evaluation - 08/13/17 0740      Psychosocial Re-Evaluation   Current issues with  None Identified    Comments  no psychosocial needs identified, no interventions necessary .     Expected Outcomes  pt will exhibit positive outlook with good coping skills.     Interventions  Encouraged to attend Cardiac Rehabilitation for the exercise;Stress management education;Relaxation education    Continue Psychosocial Services   No Follow up required       Vocational Rehabilitation: Provide vocational rehab assistance to qualifying candidates.   Vocational Rehab Evaluation & Intervention: Vocational Rehab - 06/17/17 [phone]      Initial Vocational Rehab Evaluation & Intervention   Assessment shows  need for Vocational Rehabilitation  No       Education: Education Goals: Education classes will be provided on a weekly basis, covering required topics. Participant will state understanding/return demonstration of topics presented.  Learning Barriers/Preferences: Learning Barriers/Preferences - 06/17/17 1015      Learning Barriers/Preferences   Learning Preferences  Skilled Demonstration;Verbal Instruction;Video;Audio       Education Topics: Count Your Pulse:  -Group instruction provided by verbal instruction, demonstration, patient participation and written materials to support subject.  Instructors address importance of being able to find your pulse and how to count your pulse when at home without a heart monitor.  Patients get hands on experience counting their pulse with staff help and individually.   Heart Attack, Angina, and Risk Factor Modification:  -Group instruction provided by verbal instruction, video, and written materials to support subject.  Instructors address signs and symptoms of angina and heart attacks.    Also discuss risk factors for heart disease and how to make changes to improve heart health risk factors.   Functional Fitness:  -Group instruction provided by verbal instruction, demonstration, patient participation, and written materials to support subject.  Instructors address safety measures for doing things around the house.  Discuss how to get up and down off the floor, how to pick things up properly, how to safely get out of a chair without assistance, and balance training.   Meditation and Mindfulness:  -Group instruction provided by verbal instruction, patient participation, and written materials to support subject.  Instructor addresses importance of mindfulness and meditation practice to help reduce stress and improve awareness.  Instructor also leads participants through a meditation exercise.    Stretching for Flexibility and Mobility:  -Group  instruction provided by verbal instruction, patient participation, and written materials to support subject.  Instructors lead participants through series of stretches that are designed to increase flexibility thus improving mobility.  These stretches are additional exercise for major muscle groups that are typically performed during regular warm up and cool down.   Hands Only CPR:  -Group verbal, video, and participation provides a basic overview of AHA guidelines for community CPR. Role-play of emergencies allow participants the opportunity to practice calling for help and chest compression technique with discussion of AED use.   Hypertension: -Group verbal  and written instruction that provides a basic overview of hypertension including the most recent diagnostic guidelines, risk factor reduction with self-care instructions and medication management.    Nutrition I class: Heart Healthy Eating:  -Group instruction provided by PowerPoint slides, verbal discussion, and written materials to support subject matter. The instructor gives an explanation and review of the Therapeutic Lifestyle Changes diet recommendations, which includes a discussion on lipid goals, dietary fat, sodium, fiber, plant stanol/sterol esters, sugar, and the components of a well-balanced, healthy diet.   Nutrition II class: Lifestyle Skills:  -Group instruction provided by PowerPoint slides, verbal discussion, and written materials to support subject matter. The instructor gives an explanation and review of label reading, grocery shopping for heart health, heart healthy recipe modifications, and ways to make healthier choices when eating out.   Diabetes Question & Answer:  -Group instruction provided by PowerPoint slides, verbal discussion, and written materials to support subject matter. The instructor gives an explanation and review of diabetes co-morbidities, pre- and post-prandial blood glucose goals, pre-exercise blood  glucose goals, signs, symptoms, and treatment of hypoglycemia and hyperglycemia, and foot care basics.   Diabetes Blitz:  -Group instruction provided by PowerPoint slides, verbal discussion, and written materials to support subject matter. The instructor gives an explanation and review of the physiology behind type 1 and type 2 diabetes, diabetes medications and rational behind using different medications, pre- and post-prandial blood glucose recommendations and Hemoglobin A1c goals, diabetes diet, and exercise including blood glucose guidelines for exercising safely.    Portion Distortion:  -Group instruction provided by PowerPoint slides, verbal discussion, written materials, and food models to support subject matter. The instructor gives an explanation of serving size versus portion size, changes in portions sizes over the last 20 years, and what consists of a serving from each food group.   Stress Management:  -Group instruction provided by verbal instruction, video, and written materials to support subject matter.  Instructors review role of stress in heart disease and how to cope with stress positively.     Exercising on Your Own:  -Group instruction provided by verbal instruction, power point, and written materials to support subject.  Instructors discuss benefits of exercise, components of exercise, frequency and intensity of exercise, and end points for exercise.  Also discuss use of nitroglycerin and activating EMS.  Review options of places to exercise outside of rehab.  Review guidelines for sex with heart disease.   Cardiac Drugs I:  -Group instruction provided by verbal instruction and written materials to support subject.  Instructor reviews cardiac drug classes: antiplatelets, anticoagulants, beta blockers, and statins.  Instructor discusses reasons, side effects, and lifestyle considerations for each drug class.   Cardiac Drugs II:  -Group instruction provided by verbal  instruction and written materials to support subject.  Instructor reviews cardiac drug classes: angiotensin converting enzyme inhibitors (ACE-I), angiotensin II receptor blockers (ARBs), nitrates, and calcium channel blockers.  Instructor discusses reasons, side effects, and lifestyle considerations for each drug class.   Anatomy and Physiology of the Circulatory System:  Group verbal and written instruction and models provide basic cardiac anatomy and physiology, with the coronary electrical and arterial systems. Review of: AMI, Angina, Valve disease, Heart Failure, Peripheral Artery Disease, Cardiac Arrhythmia, Pacemakers, and the ICD.   Other Education:  -Group or individual verbal, written, or video instructions that support the educational goals of the cardiac rehab program.   Knowledge Questionnaire Score: Knowledge Questionnaire Score - 06/17/17 0923      Knowledge Questionnaire  Score   Pre Score  20/24       Core Components/Risk Factors/Patient Goals at Admission: Personal Goals and Risk Factors at Admission - 06/23/17 0744      Core Components/Risk Factors/Patient Goals on Admission    Weight Management  Yes;Weight Maintenance;Obesity;Weight Loss    Intervention  Weight Management: Develop a combined nutrition and exercise program designed to reach desired caloric intake, while maintaining appropriate intake of nutrient and fiber, sodium and fats, and appropriate energy expenditure required for the weight goal.    Admit Weight  246 lb 11.1 oz (111.9 kg)    Goal Weight: Short Term  236 lb (107 kg)    Goal Weight: Long Term  206 lb (93.4 kg)    Expected Outcomes  Short Term: Continue to assess and modify interventions until short term weight is achieved;Long Term: Adherence to nutrition and physical activity/exercise program aimed toward attainment of established weight goal;Weight Maintenance: Understanding of the daily nutrition guidelines, which includes 25-35% calories from  fat, 7% or less cal from saturated fats, less than 200mg  cholesterol, less than 1.5gm of sodium, & 5 or more servings of fruits and vegetables daily;Weight Loss: Understanding of general recommendations for a balanced deficit meal plan, which promotes 1-2 lb weight loss per week and includes a negative energy balance of [phone] kcal/d;Understanding recommendations for meals to include 15-35% energy as protein, 25-35% energy from fat, 35-60% energy from carbohydrates, less than 200mg  of dietary cholesterol, 20-35 gm of total fiber daily;Understanding of distribution of calorie intake throughout the day with the consumption of 4-5 meals/snacks    Hypertension  Yes    Intervention  Monitor prescription use compliance.;Provide education on lifestyle modifcations including regular physical activity/exercise, weight management, moderate sodium restriction and increased consumption of fresh fruit, vegetables, and low fat dairy, alcohol moderation, and smoking cessation.    Expected Outcomes  Short Term: Continued assessment and intervention until BP is < 140/88mm HG in hypertensive participants. < 130/84mm HG in hypertensive participants with diabetes, heart failure or chronic kidney disease.;Long Term: Maintenance of blood pressure at goal levels.    Personal Goal Other  Yes    Personal Goal  pt personal goals are to increase EF, lose weight, decrease fear of activity to develop exercise regimen.     Intervention  Provide CR exercise, nutrition and lifestyle  modification education opportunities.    Expected Outcomes  pt will participate in CR exercise, nutrition and lifestyle modification education        Core Components/Risk Factors/Patient Goals Review:  Goals and Risk Factor Review    Row Name 06/24/17 1509 07/15/17 0735 08/13/17 0740         Core Components/Risk Factors/Patient Goals Review   Personal Goals Review  Weight Management/Obesity;Hypertension;Other  Weight  Management/Obesity;Hypertension;Other  Weight Management/Obesity;Hypertension;Other     Review  pt with multiple CAD RF demonstrates eagerness to participate in CR program.   pt with multiple CAD RF demonstrates eagerness to participate in CR program.  pt demonstrates decreased dyspnea and fatigue with exertion   pt with multiple CAD RF demonstrates eagerness to participate in CR program.  pt demonstrates increased strength/stamina.  walking distance significantly increased. pt congratulated on 6kg weight loss thus far.       Expected Outcomes  pt will participate in CR exercise, nutrition and lifestyle modification offerings to decrease overall RF.   pt will participate in CR exercise, nutrition and lifestyle modification offerings to decrease overall RF.   pt will participate in CR  exercise, nutrition and lifestyle modification offerings to decrease overall RF.         Core Components/Risk Factors/Patient Goals at Discharge (Final Review):  Goals and Risk Factor Review - 08/13/17 0740      Core Components/Risk Factors/Patient Goals Review   Personal Goals Review  Weight Management/Obesity;Hypertension;Other    Review  pt with multiple CAD RF demonstrates eagerness to participate in CR program.  pt demonstrates increased strength/stamina.  walking distance significantly increased. pt congratulated on 6kg weight loss thus far.      Expected Outcomes  pt will participate in CR exercise, nutrition and lifestyle modification offerings to decrease overall RF.        ITP Comments: ITP Comments    Row Name 06/17/17 1610 06/24/17 1507 07/15/17 0735 08/13/17 0739     ITP Comments  Medical Director, Dr. Armanda Magic  30 day ITP review.  Pt new this week  to group exercise sessions.   No change in current regimen unless directed by Medical Director.    30 day ITP review.   No change in current regimen unless directed by Medical Director.    30 day ITP review.   pt with good attendance and participation        Comments:

## 2017-08-15 NOTE — Progress Notes (Signed)
Cardiac Individual Treatment Plan  Patient Details  Name: Rosevelt Luu MRN: [id number] Date of Birth: [birth year] Referring Provider:     CARDIAC REHAB PHASE II ORIENTATION from 06/17/2017 in [facility] CARDIAC REHAB  Referring Provider  Nanetta Batty MD      Initial Encounter Date:    CARDIAC REHAB PHASE II ORIENTATION from 06/17/2017 in [facility] CARDIAC REHAB  Date  06/17/17  Referring Provider  Nanetta Batty MD      Visit Diagnosis: Heart failure, chronic systolic (HCC)  Patient's Home Medications on Admission:  Current Outpatient Medications:  .  amoxicillin (AMOXIL) 500 MG capsule, Take 4 capsules by mouth once. Prior to dental appt this afternoon, Disp: , Rfl:  .  aspirin 81 MG chewable tablet, Chew 1 tablet (81 mg total) by mouth daily., Disp: , Rfl:  .  buPROPion (WELLBUTRIN XL) 150 MG 24 hr tablet, Take 1 tablet by mouth daily., Disp: , Rfl:  .  carvedilol (COREG) 6.25 MG tablet, Take 12.5 mg by mouth 2 (two) times daily., Disp: , Rfl:  .  escitalopram (LEXAPRO) 10 MG tablet, TAKE 0.5 TABLET (5 MG TOTAL) BY MOUTH DAILY., Disp: , Rfl: 2 .  fluticasone (FLONASE) 50 MCG/ACT nasal spray, Place 2 sprays into both nostrils daily., Disp: 16 g, Rfl: 3 .  furosemide (LASIX) 40 MG tablet, Take 1 tablet (40 mg total) by mouth daily., Disp: 90 tablet, Rfl: 3 .  LORazepam (ATIVAN) 1 MG tablet, Take 0.5-1 tablets (0.5-1 mg total) by mouth 3 (three) times daily as needed for anxiety or sleep., Disp: 30 tablet, Rfl: 0 .  losartan (COZAAR) 25 MG tablet, Take 1 tablet by mouth daily., Disp: , Rfl:  .  Multiple Vitamins-Minerals (MULTIVITAMIN WITH MINERALS) tablet, Take 1 tablet by mouth daily., Disp: , Rfl:  .  potassium chloride SA (K-DUR,KLOR-CON) 20 MEQ tablet, Take 1 tablet (20 mEq total) by mouth daily., Disp: 90 tablet, Rfl: 3  Past Medical History: Past Medical History:  Diagnosis Date  . Anxiety    Ativan  . Chronic combined systolic  and diastolic CHF, NYHA class 3 (HCC) 10/2016   Nonischemic cardiomyopathy. EF 20-25%.  . Chronic left hip pain   . Depression    history of  . GI bleed 03/2016  . Headache   . Hypertensive heart disease with combined systolic and diastolic congestive heart failure (HCC) 10/2016  . Nonischemic cardiomyopathy (HCC) 10/2016   Echo with EF 20-25%. Cardiac catheterization with no CAD. LVEDP was 41 mmHg, PCWP 36 mmHg  . Osteoarthritis    right shoulder  . Right hand fracture   . Seasonal allergies   . Sleep apnea    wears a CPAP  . Wears glasses     Tobacco Use: Social History   Tobacco Use  Smoking Status Never Smoker  Smokeless Tobacco Never Used    Labs: Recent Review Flowsheet Data    Labs for ITP Cardiac and Pulmonary Rehab Latest Ref Rng & Units 10/21/2016 10/21/2016   PHART 7.350 - 7.450 - 7.427   PCO2ART 32.0 - 48.0 mmHg - 41.7   HCO3 20.0 - 28.0 mmol/L 29.3(H) 27.5   TCO2 0 - 100 mmol/L 31 29   O2SAT % 61.0 99.0      Capillary Blood Glucose: No results found for: GLUCAP   Exercise Target Goals:    Exercise Program Goal: Individual exercise prescription set with THRR, safety & activity barriers. Participant demonstrates ability to understand and report RPE using BORG  scale, to self-measure pulse accurately, and to acknowledge the importance of the exercise prescription.  Exercise Prescription Goal: Starting with aerobic activity 30 plus minutes a day, 3 days per week for initial exercise prescription. Provide home exercise prescription and guidelines that participant acknowledges understanding prior to discharge.  Activity Barriers & Risk Stratification: Activity Barriers & Cardiac Risk Stratification - 06/17/17 1004      Activity Barriers & Cardiac Risk Stratification   Activity Barriers  Left Hip Replacement;Other (comment)    Comments  R shoulder replacement    Cardiac Risk Stratification  High       6 Minute Walk: 6 Minute Walk    Row Name 06/17/17  1030         6 Minute Walk   Phase  Initial     Distance  1769 feet     Distance Feet Change  1769 ft     Walk Time  6 minutes     # of Rest Breaks  0     MPH  3.4     METS  4.28     RPE  13     VO2 Peak  14.98     Symptoms  Yes (comment)     Comments  fatigue/slight breathlessness     Resting HR  82 bpm     Resting BP  114/64     Resting Oxygen Saturation   100 %     Exercise Oxygen Saturation  during 6 min walk  97 %     Max Ex. HR  121 bpm     Max Ex. BP  136/72     2 Minute Post BP  120/72        Oxygen Initial Assessment:   Oxygen Re-Evaluation:   Oxygen Discharge (Final Oxygen Re-Evaluation):   Initial Exercise Prescription: Initial Exercise Prescription - 06/17/17 1000      Date of Initial Exercise RX and Referring Provider   Date  06/17/17    Referring Provider  Nanetta Batty MD      Treadmill   MPH  3    Grade  1    Minutes  10    METs  3.71      Bike   Level  1    Minutes  10    METs  2.72      NuStep   Level  4    SPM  80    Minutes  10    METs  3      Prescription Details   Frequency (times per week)  3    Duration  Progress to 30 minutes of continuous aerobic without signs/symptoms of physical distress      Intensity   THRR 40-80% of Max Heartrate  66-132    Ratings of Perceived Exertion  11-15    Perceived Dyspnea  0-4      Progression   Progression  Continue to progress workloads to maintain intensity without signs/symptoms of physical distress.      Resistance Training   Training Prescription  Yes    Weight  5lbs    Reps  10-15       Perform Capillary Blood Glucose checks as needed.  Exercise Prescription Changes: Exercise Prescription Changes    Row Name 06/23/17 1100 07/04/17 1500 07/11/17 0800 07/14/17 0900 08/04/17 0659     Response to Exercise   Blood Pressure (Admit)  112/78  128/62  118/78  108/70  108/75   Blood Pressure (  Exercise)  128/78  102/78  118/66  138/80  128/78   Blood Pressure (Exit)  98/60   100/78  110/68  112/68  120/80   Heart Rate (Admit)  91 bpm  89 bpm  97 bpm  94 bpm  97 bpm   Heart Rate (Exercise)  108 bpm  110 bpm  109 bpm  115 bpm  114 bpm   Heart Rate (Exit)  88 bpm  69 bpm  85 bpm  87 bpm  77 bpm   Rating of Perceived Exertion (Exercise)  15  12  12  12  14    Symptoms  none  none  none  none  none   Comments  Decresed workload on TM due to dificulty.  -  -  -  -   Duration  Progress to 30 minutes of  aerobic without signs/symptoms of physical distress  Progress to 30 minutes of  aerobic without signs/symptoms of physical distress  Progress to 30 minutes of  aerobic without signs/symptoms of physical distress  Progress to 30 minutes of  aerobic without signs/symptoms of physical distress  Progress to 30 minutes of  aerobic without signs/symptoms of physical distress   Intensity  THRR unchanged  THRR unchanged  THRR unchanged  THRR unchanged  THRR unchanged     Progression   Progression  Continue to progress workloads to maintain intensity without signs/symptoms of physical distress.  Continue to progress workloads to maintain intensity without signs/symptoms of physical distress.  Continue to progress workloads to maintain intensity without signs/symptoms of physical distress.  Continue to progress workloads to maintain intensity without signs/symptoms of physical distress.  Continue to progress workloads to maintain intensity without signs/symptoms of physical distress.   Average METs  2.5  2.5  2.7  3.5  3.7     Resistance Training   Training Prescription  Yes  Yes  Yes  Yes  No Out early   Weight  2lbs  3lbs  3lbs  5lbs  -   Reps  10-15  10-15  10-15  10-15  -   Time  10 Minutes  10 Minutes  10 Minutes  10 Minutes  -     Interval Training   Interval Training  No  No  No  No  No     Treadmill   MPH  2.2 3.0 mph on the TM too difficult, will decrease speed to 2.2.  -  -  -  -   Grade  1  -  -  -  -   Minutes  10 Pt did 5 minutes at 3.73mph then rested.  -  -  -  -    METs  2.99  -  -  -  -     Bike   Level  1  -  1.4  1.7  1.7   Minutes  10  -  10  10  10    METs  2.69  -  3.45  3.98  4.01     NuStep   Level  3  3  4  4  5    SPM  80  80  85  85  85   Minutes  10  20  10  10  10    METs  1.8  2  2.3  2.3  3.6     Track   Laps  -  8  5  19  14    Minutes  -  10  6  6  6   METs  -  2.39  2.45  4.3  3.43     Home Exercise Plan   Plans to continue exercise at  -  -  Home (comment)  Home (comment)  Home (comment)   Frequency  -  -  Add 3 additional days to program exercise sessions.  Add 3 additional days to program exercise sessions.  Add 3 additional days to program exercise sessions.   Initial Home Exercises Provided  -  -  07/11/17  07/11/17  07/11/17   Row Name 08/13/17 0700             Response to Exercise   Blood Pressure (Admit)  130/80       Blood Pressure (Exercise)  140/94       Blood Pressure (Exit)  114/72       Heart Rate (Admit)  100 bpm       Heart Rate (Exercise)  119 bpm       Heart Rate (Exit)  80 bpm       Rating of Perceived Exertion (Exercise)  12       Symptoms  none       Duration  Progress to 30 minutes of  aerobic without signs/symptoms of physical distress       Intensity  THRR unchanged         Progression   Progression  Continue to progress workloads to maintain intensity without signs/symptoms of physical distress.       Average METs  3.3         Resistance Training   Training Prescription  No Relaxation today         Interval Training   Interval Training  No         Bike   Level  1.7       Minutes  10       METs  3.98         NuStep   Level  5       SPM  85       Minutes  10       METs  2.9         Track   Laps  11       Minutes  6       METs  2.91         Home Exercise Plan   Plans to continue exercise at  Home (comment)       Frequency  Add 3 additional days to program exercise sessions.       Initial Home Exercises Provided  07/11/17          Exercise Comments: Exercise Comments     Row Name 06/23/17 1149 07/11/17 0719 07/14/17 0654 08/01/17 0721 08/13/17 0723   Exercise Comments  Off to a good start with exercise, adjusted workloads due to dificulty.  Reviewed home exercise guidelines with patient.  Reviewed goals with patient.  Reviewed METs and goals with patient.  Reviewed METs with patient.      Exercise Goals and Review: Exercise Goals    Row Name 06/17/17 1009             Exercise Goals   Increase Physical Activity  Yes       Intervention  Provide advice, education, support and counseling about physical activity/exercise needs.;Develop an individualized exercise prescription for aerobic and resistive training based on initial evaluation findings, risk stratification, comorbidities and participant's  personal goals.       Expected Outcomes  Achievement of increased cardiorespiratory fitness and enhanced flexibility, muscular endurance and strength shown through measurements of functional capacity and personal statement of participant.       Increase Strength and Stamina  Yes       Intervention  Provide advice, education, support and counseling about physical activity/exercise needs.;Develop an individualized exercise prescription for aerobic and resistive training based on initial evaluation findings, risk stratification, comorbidities and participant's personal goals.       Expected Outcomes  Achievement of increased cardiorespiratory fitness and enhanced flexibility, muscular endurance and strength shown through measurements of functional capacity and personal statement of participant.       Able to understand and use rate of perceived exertion (RPE) scale  Yes       Intervention  Provide education and explanation on how to use RPE scale       Expected Outcomes  Short Term: Able to use RPE daily in rehab to express subjective intensity level;Long Term:  Able to use RPE to guide intensity level when exercising independently       Knowledge and understanding of  Target Heart Rate Range (THRR)  Yes       Intervention  Provide education and explanation of THRR including how the numbers were predicted and where they are located for reference       Expected Outcomes  Short Term: Able to state/look up THRR;Long Term: Able to use THRR to govern intensity when exercising independently;Short Term: Able to use daily as guideline for intensity in rehab       Able to check pulse independently  Yes       Intervention  Provide education and demonstration on how to check pulse in carotid and radial arteries.;Review the importance of being able to check your own pulse for safety during independent exercise       Expected Outcomes  Short Term: Able to explain why pulse checking is important during independent exercise;Long Term: Able to check pulse independently and accurately       Understanding of Exercise Prescription  Yes       Intervention  Provide education, explanation, and written materials on patient's individual exercise prescription       Expected Outcomes  Short Term: Able to explain program exercise prescription;Long Term: Able to explain home exercise prescription to exercise independently          Exercise Goals Re-Evaluation : Exercise Goals Re-Evaluation    Row Name 06/23/17 0800 07/11/17 0719 07/14/17 0654 08/04/17 0740       Exercise Goal Re-Evaluation   Exercise Goals Review  Able to understand and use rate of perceived exertion (RPE) scale;Increase Physical Activity  Increase Physical Activity;Understanding of Exercise Prescription;Knowledge and understanding of Target Heart Rate Range (THRR)  Increase Physical Activity  Increase Physical Activity;Knowledge and understanding of Target Heart Rate Range (THRR)    Comments  Off to a good start with exercise. Decreased speed on treadmill due to difficulty. Demonstrates understanding of and ability to RPE scale.  Reviewed home exercise guidelines with patient including THRR, RPE scale and stop signs for  exercise. Patient currently walking some at home, about 10 minutes when he walks.  Patient is walking 15 minutes and riding stationary bike 15 minutes on the days he doesn't attend CR.  Patient is doing well with exercise at cardiac rehab and walking and riding his stationary bike at home. Pt c/o some neck pain today. Reviewed  target heart rate range with patient.    Expected Outcomes  Increase workloads as tolerated.  Increase walking at home from 10 minutes to 30 minutes, 3 days/week.  Continue home exercise prgram riding stationary bike and walking to achieve 30 minutes of exercise on the days he doesn't attend CR.  Continue current exercise prescription 30 minutes at least 5 days/week.        Discharge Exercise Prescription (Final Exercise Prescription Changes): Exercise Prescription Changes - 08/13/17 0700      Response to Exercise   Blood Pressure (Admit)  130/80    Blood Pressure (Exercise)  140/94    Blood Pressure (Exit)  114/72    Heart Rate (Admit)  100 bpm    Heart Rate (Exercise)  119 bpm    Heart Rate (Exit)  80 bpm    Rating of Perceived Exertion (Exercise)  12    Symptoms  none    Duration  Progress to 30 minutes of  aerobic without signs/symptoms of physical distress    Intensity  THRR unchanged      Progression   Progression  Continue to progress workloads to maintain intensity without signs/symptoms of physical distress.    Average METs  3.3      Resistance Training   Training Prescription  No Relaxation today      Interval Training   Interval Training  No      Bike   Level  1.7    Minutes  10    METs  3.98      NuStep   Level  5    SPM  85    Minutes  10    METs  2.9      Track   Laps  11    Minutes  6    METs  2.91      Home Exercise Plan   Plans to continue exercise at  Home (comment)    Frequency  Add 3 additional days to program exercise sessions.    Initial Home Exercises Provided  07/11/17       Nutrition:  Target Goals: Understanding  of nutrition guidelines, daily intake of sodium 1500mg , cholesterol 200mg , calories 30% from fat and 7% or less from saturated fats, daily to have 5 or more servings of fruits and vegetables.  Biometrics: Pre Biometrics - 06/17/17 1009      Pre Biometrics   Waist Circumference  47.25 inches    Hip Circumference  47.5 inches    Waist to Hip Ratio  0.99 %    Triceps Skinfold  26 mm    % Body Fat  35.5 %    Grip Strength  36 kg    Flexibility  0 in    Single Leg Stand  2.31 seconds        Nutrition Therapy Plan and Nutrition Goals: Nutrition Therapy & Goals - 06/17/17 1529      Nutrition Therapy   Diet  Therapeutic Lifestyle Changes      Personal Nutrition Goals   Nutrition Goal  Pt to identify and limit food sources of saturated fat, trans fat, and sodium    Personal Goal #2  Pt to identify food quantities necessary to achieve weight loss of 6-24 lb (2.7-10.9 kg) at graduation from cardiac rehab.       Intervention Plan   Intervention  Prescribe, educate and counsel regarding individualized specific dietary modifications aiming towards targeted core components such as weight, hypertension, lipid management, diabetes, heart failure  and other comorbidities.    Expected Outcomes  Short Term Goal: Understand basic principles of dietary content, such as calories, fat, sodium, cholesterol and nutrients.;Long Term Goal: Adherence to prescribed nutrition plan.       Nutrition Discharge: Nutrition Scores: Nutrition Assessments - 06/17/17 1529      MEDFICTS Scores   Pre Score  55       Nutrition Goals Re-Evaluation:   Nutrition Goals Re-Evaluation:   Nutrition Goals Discharge (Final Nutrition Goals Re-Evaluation):   Psychosocial: Target Goals: Acknowledge presence or absence of significant depression and/or stress, maximize coping skills, provide positive support system. Participant is able to verbalize types and ability to use techniques and skills needed for reducing  stress and depression.  Initial Review & Psychosocial Screening: Initial Psych Review & Screening - 06/17/17 0924      Initial Review   Current issues with  None Identified      Family Dynamics   Good Support System?  Yes    Comments  upon brief assessment, no psychosocial needs identified, no interventions necessary       Barriers   Psychosocial barriers to participate in program  There are no identifiable barriers or psychosocial needs.      Screening Interventions   Interventions  Encouraged to exercise;To provide support and resources with identified psychosocial needs       Quality of Life Scores: Quality of Life - 08/01/17 1037      Quality of Life Scores   Health/Function Pre  17.67 % pt with significant fatigue from recent cardiac event notes improvement with cardiac rehab activities. pt demonstrates eagerness to make lifestyle modifications to decrease CAD risk factors.      Socioeconomic Pre  24.38 % pt verbalizes high stress career     Psych/Spiritual Pre  19.5 %    Family Pre  28.8 %    GLOBAL Pre  21.16 % overall pt verbalizes positive outlook with good coping skills.        PHQ-9: Recent Review Flowsheet Data    Depression screen [facility] 2/9 06/23/2017   Decreased Interest 0   Down, Depressed, Hopeless 0   PHQ - 2 Score 0     Interpretation of Total Score  Total Score Depression Severity:  1-4 = Minimal depression, 5-9 = Mild depression, 10-14 = Moderate depression, 15-19 = Moderately severe depression, 20-27 = Severe depression   Psychosocial Evaluation and Intervention: Psychosocial Evaluation - 06/23/17 0744      Psychosocial Evaluation & Interventions   Interventions  Encouraged to exercise with the program and follow exercise prescription    Comments  no psychosocial needs identified, no interventions necessary.  pt enjoys spending time with his family.     Expected Outcomes  pt will exhibit positive outlook with good coping skills.     Continue  Psychosocial Services   No Follow up required       Psychosocial Re-Evaluation: Psychosocial Re-Evaluation    Row Name 06/24/17 1510 07/15/17 0735 08/13/17 0740         Psychosocial Re-Evaluation   Current issues with  None Identified  None Identified  None Identified     Comments  no psychosocial needs identified, no interventions necessary .   no psychosocial needs identified, no interventions necessary .   no psychosocial needs identified, no interventions necessary .      Expected Outcomes  pt will exhibit positive outlook with good coping skills.   pt will exhibit positive outlook with good coping skills.  pt will exhibit positive outlook with good coping skills.      Interventions  Encouraged to attend Cardiac Rehabilitation for the exercise;Stress management education;Relaxation education  Encouraged to attend Cardiac Rehabilitation for the exercise;Stress management education;Relaxation education  Encouraged to attend Cardiac Rehabilitation for the exercise;Stress management education;Relaxation education     Continue Psychosocial Services   No Follow up required  No Follow up required  No Follow up required        Psychosocial Discharge (Final Psychosocial Re-Evaluation): Psychosocial Re-Evaluation - 08/13/17 0740      Psychosocial Re-Evaluation   Current issues with  None Identified    Comments  no psychosocial needs identified, no interventions necessary .     Expected Outcomes  pt will exhibit positive outlook with good coping skills.     Interventions  Encouraged to attend Cardiac Rehabilitation for the exercise;Stress management education;Relaxation education    Continue Psychosocial Services   No Follow up required       Vocational Rehabilitation: Provide vocational rehab assistance to qualifying candidates.   Vocational Rehab Evaluation & Intervention: Vocational Rehab - 06/17/17 [phone]      Initial Vocational Rehab Evaluation & Intervention   Assessment shows  need for Vocational Rehabilitation  No       Education: Education Goals: Education classes will be provided on a weekly basis, covering required topics. Participant will state understanding/return demonstration of topics presented.  Learning Barriers/Preferences: Learning Barriers/Preferences - 06/17/17 1015      Learning Barriers/Preferences   Learning Preferences  Skilled Demonstration;Verbal Instruction;Video;Audio       Education Topics: Count Your Pulse:  -Group instruction provided by verbal instruction, demonstration, patient participation and written materials to support subject.  Instructors address importance of being able to find your pulse and how to count your pulse when at home without a heart monitor.  Patients get hands on experience counting their pulse with staff help and individually.   Heart Attack, Angina, and Risk Factor Modification:  -Group instruction provided by verbal instruction, video, and written materials to support subject.  Instructors address signs and symptoms of angina and heart attacks.    Also discuss risk factors for heart disease and how to make changes to improve heart health risk factors.   Functional Fitness:  -Group instruction provided by verbal instruction, demonstration, patient participation, and written materials to support subject.  Instructors address safety measures for doing things around the house.  Discuss how to get up and down off the floor, how to pick things up properly, how to safely get out of a chair without assistance, and balance training.   Meditation and Mindfulness:  -Group instruction provided by verbal instruction, patient participation, and written materials to support subject.  Instructor addresses importance of mindfulness and meditation practice to help reduce stress and improve awareness.  Instructor also leads participants through a meditation exercise.    Stretching for Flexibility and Mobility:  -Group  instruction provided by verbal instruction, patient participation, and written materials to support subject.  Instructors lead participants through series of stretches that are designed to increase flexibility thus improving mobility.  These stretches are additional exercise for major muscle groups that are typically performed during regular warm up and cool down.   Hands Only CPR:  -Group verbal, video, and participation provides a basic overview of AHA guidelines for community CPR. Role-play of emergencies allow participants the opportunity to practice calling for help and chest compression technique with discussion of AED use.   Hypertension: -Group verbal  and written instruction that provides a basic overview of hypertension including the most recent diagnostic guidelines, risk factor reduction with self-care instructions and medication management.    Nutrition I class: Heart Healthy Eating:  -Group instruction provided by PowerPoint slides, verbal discussion, and written materials to support subject matter. The instructor gives an explanation and review of the Therapeutic Lifestyle Changes diet recommendations, which includes a discussion on lipid goals, dietary fat, sodium, fiber, plant stanol/sterol esters, sugar, and the components of a well-balanced, healthy diet.   Nutrition II class: Lifestyle Skills:  -Group instruction provided by PowerPoint slides, verbal discussion, and written materials to support subject matter. The instructor gives an explanation and review of label reading, grocery shopping for heart health, heart healthy recipe modifications, and ways to make healthier choices when eating out.   Diabetes Question & Answer:  -Group instruction provided by PowerPoint slides, verbal discussion, and written materials to support subject matter. The instructor gives an explanation and review of diabetes co-morbidities, pre- and post-prandial blood glucose goals, pre-exercise blood  glucose goals, signs, symptoms, and treatment of hypoglycemia and hyperglycemia, and foot care basics.   Diabetes Blitz:  -Group instruction provided by PowerPoint slides, verbal discussion, and written materials to support subject matter. The instructor gives an explanation and review of the physiology behind type 1 and type 2 diabetes, diabetes medications and rational behind using different medications, pre- and post-prandial blood glucose recommendations and Hemoglobin A1c goals, diabetes diet, and exercise including blood glucose guidelines for exercising safely.    Portion Distortion:  -Group instruction provided by PowerPoint slides, verbal discussion, written materials, and food models to support subject matter. The instructor gives an explanation of serving size versus portion size, changes in portions sizes over the last 20 years, and what consists of a serving from each food group.   Stress Management:  -Group instruction provided by verbal instruction, video, and written materials to support subject matter.  Instructors review role of stress in heart disease and how to cope with stress positively.     Exercising on Your Own:  -Group instruction provided by verbal instruction, power point, and written materials to support subject.  Instructors discuss benefits of exercise, components of exercise, frequency and intensity of exercise, and end points for exercise.  Also discuss use of nitroglycerin and activating EMS.  Review options of places to exercise outside of rehab.  Review guidelines for sex with heart disease.   Cardiac Drugs I:  -Group instruction provided by verbal instruction and written materials to support subject.  Instructor reviews cardiac drug classes: antiplatelets, anticoagulants, beta blockers, and statins.  Instructor discusses reasons, side effects, and lifestyle considerations for each drug class.   Cardiac Drugs II:  -Group instruction provided by verbal  instruction and written materials to support subject.  Instructor reviews cardiac drug classes: angiotensin converting enzyme inhibitors (ACE-I), angiotensin II receptor blockers (ARBs), nitrates, and calcium channel blockers.  Instructor discusses reasons, side effects, and lifestyle considerations for each drug class.   Anatomy and Physiology of the Circulatory System:  Group verbal and written instruction and models provide basic cardiac anatomy and physiology, with the coronary electrical and arterial systems. Review of: AMI, Angina, Valve disease, Heart Failure, Peripheral Artery Disease, Cardiac Arrhythmia, Pacemakers, and the ICD.   Other Education:  -Group or individual verbal, written, or video instructions that support the educational goals of the cardiac rehab program.   Knowledge Questionnaire Score: Knowledge Questionnaire Score - 06/17/17 0923      Knowledge Questionnaire  Score   Pre Score  20/24       Core Components/Risk Factors/Patient Goals at Admission: Personal Goals and Risk Factors at Admission - 06/23/17 0744      Core Components/Risk Factors/Patient Goals on Admission    Weight Management  Yes;Weight Maintenance;Obesity;Weight Loss    Intervention  Weight Management: Develop a combined nutrition and exercise program designed to reach desired caloric intake, while maintaining appropriate intake of nutrient and fiber, sodium and fats, and appropriate energy expenditure required for the weight goal.    Admit Weight  246 lb 11.1 oz (111.9 kg)    Goal Weight: Short Term  236 lb (107 kg)    Goal Weight: Long Term  206 lb (93.4 kg)    Expected Outcomes  Short Term: Continue to assess and modify interventions until short term weight is achieved;Long Term: Adherence to nutrition and physical activity/exercise program aimed toward attainment of established weight goal;Weight Maintenance: Understanding of the daily nutrition guidelines, which includes 25-35% calories from  fat, 7% or less cal from saturated fats, less than 200mg  cholesterol, less than 1.5gm of sodium, & 5 or more servings of fruits and vegetables daily;Weight Loss: Understanding of general recommendations for a balanced deficit meal plan, which promotes 1-2 lb weight loss per week and includes a negative energy balance of [phone] kcal/d;Understanding recommendations for meals to include 15-35% energy as protein, 25-35% energy from fat, 35-60% energy from carbohydrates, less than 200mg  of dietary cholesterol, 20-35 gm of total fiber daily;Understanding of distribution of calorie intake throughout the day with the consumption of 4-5 meals/snacks    Hypertension  Yes    Intervention  Monitor prescription use compliance.;Provide education on lifestyle modifcations including regular physical activity/exercise, weight management, moderate sodium restriction and increased consumption of fresh fruit, vegetables, and low fat dairy, alcohol moderation, and smoking cessation.    Expected Outcomes  Short Term: Continued assessment and intervention until BP is < 140/88mm HG in hypertensive participants. < 130/84mm HG in hypertensive participants with diabetes, heart failure or chronic kidney disease.;Long Term: Maintenance of blood pressure at goal levels.    Personal Goal Other  Yes    Personal Goal  pt personal goals are to increase EF, lose weight, decrease fear of activity to develop exercise regimen.     Intervention  Provide CR exercise, nutrition and lifestyle  modification education opportunities.    Expected Outcomes  pt will participate in CR exercise, nutrition and lifestyle modification education        Core Components/Risk Factors/Patient Goals Review:  Goals and Risk Factor Review    Row Name 06/24/17 1509 07/15/17 0735 08/13/17 0740         Core Components/Risk Factors/Patient Goals Review   Personal Goals Review  Weight Management/Obesity;Hypertension;Other  Weight  Management/Obesity;Hypertension;Other  Weight Management/Obesity;Hypertension;Other     Review  pt with multiple CAD RF demonstrates eagerness to participate in CR program.   pt with multiple CAD RF demonstrates eagerness to participate in CR program.  pt demonstrates decreased dyspnea and fatigue with exertion   pt with multiple CAD RF demonstrates eagerness to participate in CR program.  pt demonstrates increased strength/stamina.  walking distance significantly increased. pt congratulated on 6kg weight loss thus far.       Expected Outcomes  pt will participate in CR exercise, nutrition and lifestyle modification offerings to decrease overall RF.   pt will participate in CR exercise, nutrition and lifestyle modification offerings to decrease overall RF.   pt will participate in CR  exercise, nutrition and lifestyle modification offerings to decrease overall RF.         Core Components/Risk Factors/Patient Goals at Discharge (Final Review):  Goals and Risk Factor Review - 08/13/17 0740      Core Components/Risk Factors/Patient Goals Review   Personal Goals Review  Weight Management/Obesity;Hypertension;Other    Review  pt with multiple CAD RF demonstrates eagerness to participate in CR program.  pt demonstrates increased strength/stamina.  walking distance significantly increased. pt congratulated on 6kg weight loss thus far.      Expected Outcomes  pt will participate in CR exercise, nutrition and lifestyle modification offerings to decrease overall RF.        ITP Comments: ITP Comments    Row Name 06/17/17 1610 06/24/17 1507 07/15/17 0735 08/13/17 0739     ITP Comments  Medical Director, Dr. Armanda Magic  30 day ITP review.  Pt new this week  to group exercise sessions.   No change in current regimen unless directed by Medical Director.    30 day ITP review.   No change in current regimen unless directed by Medical Director.    30 day ITP review.   pt with good attendance and participation        Comments:

## 2017-08-18 ENCOUNTER — Encounter (HOSPITAL_COMMUNITY): Payer: 59

## 2017-08-18 ENCOUNTER — Telehealth (HOSPITAL_COMMUNITY): Payer: Self-pay | Admitting: *Deleted

## 2017-08-20 ENCOUNTER — Encounter (HOSPITAL_COMMUNITY): Payer: 59

## 2017-08-20 ENCOUNTER — Encounter (HOSPITAL_COMMUNITY)
Admission: RE | Admit: 2017-08-20 | Discharge: 2017-08-20 | Disposition: A | Discharge status: Home / Self Care | Payer: 59 | Source: Ambulatory Visit | Attending: Cardiology | Admitting: Cardiology

## 2017-08-20 DIAGNOSIS — I429 Cardiomyopathy, unspecified: Secondary | ICD-10-CM | POA: Diagnosis not present

## 2017-08-20 DIAGNOSIS — I5022 Chronic systolic (congestive) heart failure: Secondary | ICD-10-CM

## 2017-08-22 ENCOUNTER — Encounter (HOSPITAL_COMMUNITY): Payer: 59

## 2017-08-23 ENCOUNTER — Emergency Department (HOSPITAL_BASED_OUTPATIENT_CLINIC_OR_DEPARTMENT_OTHER)
Admission: EM | Admit: 2017-08-23 | Discharge: 2017-08-23 | Disposition: A | Discharge status: Home / Self Care | Payer: 59 | Attending: Physician Assistant | Admitting: Physician Assistant

## 2017-08-23 ENCOUNTER — Other Ambulatory Visit: Payer: Self-pay

## 2017-08-23 ENCOUNTER — Emergency Department (HOSPITAL_BASED_OUTPATIENT_CLINIC_OR_DEPARTMENT_OTHER): Payer: 59

## 2017-08-23 ENCOUNTER — Encounter (HOSPITAL_BASED_OUTPATIENT_CLINIC_OR_DEPARTMENT_OTHER): Payer: Self-pay | Admitting: Emergency Medicine

## 2017-08-23 DIAGNOSIS — Z7982 Long term (current) use of aspirin: Secondary | ICD-10-CM | POA: Diagnosis not present

## 2017-08-23 DIAGNOSIS — Z79899 Other long term (current) drug therapy: Secondary | ICD-10-CM | POA: Insufficient documentation

## 2017-08-23 DIAGNOSIS — S42212A Unspecified displaced fracture of surgical neck of left humerus, initial encounter for closed fracture: Secondary | ICD-10-CM | POA: Diagnosis not present

## 2017-08-23 DIAGNOSIS — Y9301 Activity, walking, marching and hiking: Secondary | ICD-10-CM | POA: Insufficient documentation

## 2017-08-23 DIAGNOSIS — W19XXXA Unspecified fall, initial encounter: Secondary | ICD-10-CM

## 2017-08-23 DIAGNOSIS — Z96611 Presence of right artificial shoulder joint: Secondary | ICD-10-CM | POA: Diagnosis not present

## 2017-08-23 DIAGNOSIS — W010XXA Fall on same level from slipping, tripping and stumbling without subsequent striking against object, initial encounter: Secondary | ICD-10-CM | POA: Insufficient documentation

## 2017-08-23 DIAGNOSIS — I110 Hypertensive heart disease with heart failure: Secondary | ICD-10-CM | POA: Diagnosis not present

## 2017-08-23 DIAGNOSIS — Z96642 Presence of left artificial hip joint: Secondary | ICD-10-CM | POA: Diagnosis not present

## 2017-08-23 DIAGNOSIS — Y998 Other external cause status: Secondary | ICD-10-CM | POA: Diagnosis not present

## 2017-08-23 DIAGNOSIS — I5042 Chronic combined systolic (congestive) and diastolic (congestive) heart failure: Secondary | ICD-10-CM | POA: Insufficient documentation

## 2017-08-23 DIAGNOSIS — Y92019 Unspecified place in single-family (private) house as the place of occurrence of the external cause: Secondary | ICD-10-CM | POA: Insufficient documentation

## 2017-08-23 DIAGNOSIS — S4992XA Unspecified injury of left shoulder and upper arm, initial encounter: Secondary | ICD-10-CM | POA: Diagnosis present

## 2017-08-23 MED ORDER — OXYCODONE-ACETAMINOPHEN 5-325 MG PO TABS: 1.0000 | ORAL_TABLET | Freq: Four times a day (QID) | ORAL | 0 refills | Status: DC | PRN

## 2017-08-23 MED ORDER — MORPHINE SULFATE (PF) 4 MG/ML IV SOLN
4.0000 mg | Freq: Once | INTRAVENOUS | Status: AC
  Administered 2017-08-23: 4 mg via INTRAMUSCULAR
  Filled 2017-08-23: qty 1

## 2017-08-23 NOTE — ED Notes (Signed)
ED Provider at bedside. 

## 2017-08-23 NOTE — ED Triage Notes (Signed)
Pt tripped over the dog's bed, injuring L shoulder. Denies LOC.

## 2017-08-23 NOTE — Discharge Instructions (Signed)
Please follow-up with orthopedics in the next week.

## 2017-08-23 NOTE — ED Provider Notes (Signed)
[facility] EMERGENCY DEPARTMENT Provider Note   CSN: [id number] Arrival date & time: 08/23/17  1029     History   Chief Complaint Chief Complaint  Patient presents with  . Fall    HPI Peng Thorstenson is a 56 y.o. male.  HPI   56 year old male with mechanical fall found to his left shoulder.  He came here for evaluation.  Tripped over a dog bed at home.  Normal sensation and strength distally.  Patient had a total right shoulder and a left hip surgery.  Not on any blood thinners.  Past Medical History:  Diagnosis Date  . Anxiety    Ativan  . Chronic combined systolic and diastolic CHF, NYHA class 3 (HCC) 10/2016   Nonischemic cardiomyopathy. EF 20-25%.  . Chronic left hip pain   . Depression    history of  . GI bleed 03/2016  . Headache   . Hypertensive heart disease with combined systolic and diastolic congestive heart failure (HCC) 10/2016  . Nonischemic cardiomyopathy (HCC) 10/2016   Echo with EF 20-25%. Cardiac catheterization with no CAD. LVEDP was 41 mmHg, PCWP 36 mmHg  . Osteoarthritis    right shoulder  . Right hand fracture   . Seasonal allergies   . Sleep apnea    wears a CPAP  . Wears glasses     Patient Active Problem List   Diagnosis Date Noted  . Essential hypertension 11/08/2016  . Non-ischemic cardiomyopathy (HCC)   . Hyponatremia 10/18/2016  . AKI (acute kidney injury) (HCC) 10/18/2016  . Acute systolic congestive heart failure, NYHA class 4 (HCC) 10/17/2016  . GI bleed 03/19/2016  . Elevated troponin 03/19/2016  . Acute blood loss anemia 03/19/2016  . EtOH dependence (HCC) 03/19/2016  . Acute upper GI bleed 03/19/2016  . S/P shoulder replacement 11/24/2015  . Elevated transaminase level 03/27/2015  . Degenerative joint disease of left hip 03/27/2015    Past Surgical History:  Procedure Laterality Date  . ESOPHAGOGASTRODUODENOSCOPY (EGD) WITH PROPOFOL N/A 03/20/2016   Procedure: ESOPHAGOGASTRODUODENOSCOPY (EGD) WITH PROPOFOL;   Surgeon: Charlott Rakes, MD;  Location: [facility] ENDOSCOPY;  Service: Endoscopy;  Laterality: N/A;  . RIGHT/LEFT HEART CATH AND CORONARY ANGIOGRAPHY N/A 10/21/2016   Procedure: Right/Left Heart Cath and Coronary Angiography;  Surgeon: Runell Gess, MD;  Location: [facility] INVASIVE CV LAB;  Service: Cardiovascular: Angiographically normal coronary arteries. PCWP 33-36 mmHg, LVEDP 41 mmHg. PA pressure 60/35, mean 46 mmHg.  Cardiac output/cardiac index-3.93 /1.76 (Fick), 3.49/1.57 (thermodilution)  . TOTAL HIP ARTHROPLASTY Left 03/27/2015   Procedure: LEFT TOTAL HIP ARTHROPLASTY ANTERIOR APPROACH;  Surgeon: Samson Frederic, MD;  Location: [facility] OR;  Service: Orthopedics;  Laterality: Left;  . TOTAL SHOULDER ARTHROPLASTY Right 11/24/2015   Procedure: RIGHT TOTAL SHOULDER ARTHROPLASTY;  Surgeon: Beverely Low, MD;  Location: [facility] OR;  Service: Orthopedics;  Laterality: Right;  . TRANSTHORACIC ECHOCARDIOGRAM  10/2016   EF 20-25%. Diffuse hypokinesis but akinesis of the entire inferoseptal wall and apical wall. Moderate biatrial enlargement. PA pressure estimated 64 mmHg.  . WISDOM TOOTH EXTRACTION         Home Medications    Prior to Admission medications   Medication Sig Start Date End Date Taking? Authorizing Provider  amoxicillin (AMOXIL) 500 MG capsule Take 4 capsules by mouth once. Prior to dental appt this afternoon 05/26/17   [provider]  aspirin 81 MG chewable tablet Chew 1 tablet (81 mg total) by mouth daily. 10/23/16   Dorothea Ogle, MD  buPROPion (WELLBUTRIN XL) 150  MG 24 hr tablet Take 1 tablet by mouth daily. 05/09/17   [provider]  carvedilol (COREG) 6.25 MG tablet Take 12.5 mg by mouth 2 (two) times daily. 03/25/17   [provider]  escitalopram (LEXAPRO) 10 MG tablet TAKE 0.5 TABLET (5 MG TOTAL) BY MOUTH DAILY. 10/31/15   [provider]  fluticasone (FLONASE) 50 MCG/ACT nasal spray Place 2 sprays into both nostrils daily. 10/22/16   Dorothea Ogle, MD    furosemide (LASIX) 40 MG tablet Take 1 tablet (40 mg total) by mouth daily. 11/08/16   Strader, Lennart Pall, PA-C  LORazepam (ATIVAN) 1 MG tablet Take 0.5-1 tablets (0.5-1 mg total) by mouth 3 (three) times daily as needed for anxiety or sleep. 10/21/16   Dorothea Ogle, MD  losartan (COZAAR) 25 MG tablet Take 1 tablet by mouth daily. 04/23/17   [provider]  Multiple Vitamins-Minerals (MULTIVITAMIN WITH MINERALS) tablet Take 1 tablet by mouth daily.    [provider]  potassium chloride SA (K-DUR,KLOR-CON) 20 MEQ tablet Take 1 tablet (20 mEq total) by mouth daily. 11/08/16   Ellsworth Lennox, PA-C    Family History Family History  Problem Relation Age of Onset  . Congenital heart disease Sister     Social History Social History   Tobacco Use  . Smoking status: Never Smoker  . Smokeless tobacco: Never Used  Substance Use Topics  . Alcohol use: Yes    Alcohol/week: 1.8 oz    Types: 3 Cans of beer per week    Comment: occasional  . Drug use: No     Allergies   Patient has no known allergies.   Review of Systems Review of Systems  Constitutional: Negative for activity change.  Respiratory: Negative for shortness of breath.   Cardiovascular: Negative for chest pain.  Gastrointestinal: Negative for abdominal pain.     Physical Exam Updated Vital Signs BP 125/76 (BP Location: Right Arm)   Pulse 91   Temp 98.6 F (37 C) (Oral)   Resp 16   Ht 5\' 11"  (1.803 m)   Wt 108.9 kg (240 lb)   SpO2 98%   BMI 33.47 kg/m   Physical Exam  Constitutional: He is oriented to person, place, and time. He appears well-nourished.  HENT:  Head: Normocephalic.  Mouth/Throat: Oropharynx is clear and moist.  Eyes: Conjunctivae are normal.  Neck: No tracheal deviation present.  Cardiovascular: Normal rate.  Pulmonary/Chest: Effort normal. No stridor. No respiratory distress.  Abdominal: Soft. There is no tenderness. There is no guarding.  Musculoskeletal: Normal  range of motion. He exhibits no edema.  Pain to left shoulder.  Axillary nerve sensation intact.  Normal distal sensation and pulses and strength.  Neurological: He is oriented to person, place, and time. No cranial nerve deficit.  Skin: Skin is warm and dry. No rash noted. He is not diaphoretic.  Psychiatric: He has a normal mood and affect. His behavior is normal.  Nursing note and vitals reviewed.    ED Treatments / Results  Labs (all labs ordered are listed, but only abnormal results are displayed) Labs Reviewed - No data to display  EKG  EKG Interpretation None       Radiology Dg Shoulder Left  Result Date: 08/23/2017 CLINICAL DATA:  56 year old male status post fall to the left shoulder this morning. EXAM: LEFT SHOULDER - 2+ VIEW COMPARISON:  None. FINDINGS: There is a comminuted fracture of the humeral head. No apparent dislocation. Remaining osseous structures grossly  intact within the limits of the study. Soft tissues unremarkable. IMPRESSION: Comminuted fracture of the humeral head. Electronically Signed   By: Sande BrothersSerena  Chacko M.D.   On: 08/23/2017 11:17    Procedures Procedures (including critical care time)  Medications Ordered in ED Medications - No data to display   Initial Impression / Assessment and Plan / ED Course  I have reviewed the triage vital signs and the nursing notes.  Pertinent labs & imaging results that were available during my care of the patient were reviewed by me and considered in my medical decision making (see chart for details).     56 year old male with mechanical fall found to his left shoulder.  He came here for evaluation.  Tripped over a dog bed at home.  Normal sensation and strength distally.  Patient had a total right shoulder and a left hip surgery.  Not on any blood thinners.  12:18 PM Will treat pain and follow-up with Orth O.  Comminuted humeral head fracture.  Final Clinical Impressions(s) / ED Diagnoses   Final diagnoses:   None    ED Discharge Orders    None       Abelino DerrickMackuen, Revanth Neidig Lyn, MD 08/23/17 1218

## 2017-08-25 ENCOUNTER — Encounter (HOSPITAL_COMMUNITY): Payer: 59

## 2017-08-27 ENCOUNTER — Encounter (HOSPITAL_COMMUNITY): Payer: 59

## 2017-08-27 ENCOUNTER — Ambulatory Visit (HOSPITAL_COMMUNITY): Payer: 59 | Attending: Cardiology

## 2017-08-27 ENCOUNTER — Other Ambulatory Visit: Payer: Self-pay

## 2017-08-27 ENCOUNTER — Telehealth (HOSPITAL_COMMUNITY): Payer: Self-pay | Admitting: Cardiac Rehabilitation

## 2017-08-27 DIAGNOSIS — D649 Anemia, unspecified: Secondary | ICD-10-CM | POA: Diagnosis not present

## 2017-08-27 DIAGNOSIS — I428 Other cardiomyopathies: Secondary | ICD-10-CM

## 2017-08-27 DIAGNOSIS — I10 Essential (primary) hypertension: Secondary | ICD-10-CM | POA: Diagnosis not present

## 2017-08-27 MED ORDER — PERFLUTREN LIPID MICROSPHERE
1.0000 mL | INTRAVENOUS | Status: AC | PRN
  Administered 2017-08-27: 2 mL via INTRAVENOUS

## 2017-08-27 NOTE — Telephone Encounter (Signed)
pc received from pt reporting shoulder injury at home. Pt will be out of cardiac rehab until cleared by orthopaedic surgeon.  Deveron Furlong, RN, BSN Cardiac Pulmonary Rehab 08/27/17  11:10 AM

## 2017-08-29 ENCOUNTER — Encounter (HOSPITAL_COMMUNITY): Payer: 59

## 2017-08-29 ENCOUNTER — Ambulatory Visit: Payer: Self-pay | Admitting: Cardiovascular Disease

## 2017-09-01 ENCOUNTER — Encounter (HOSPITAL_COMMUNITY): Payer: 59

## 2017-09-02 ENCOUNTER — Encounter: Payer: Self-pay | Admitting: Cardiovascular Disease

## 2017-09-02 ENCOUNTER — Ambulatory Visit (INDEPENDENT_AMBULATORY_CARE_PROVIDER_SITE_OTHER): Payer: 59 | Admitting: Cardiovascular Disease

## 2017-09-02 VITALS — BP 122/78 | HR 92 | Ht 71.0 in | Wt 230.0 lb

## 2017-09-02 DIAGNOSIS — Z13220 Encounter for screening for lipoid disorders: Secondary | ICD-10-CM

## 2017-09-02 DIAGNOSIS — I519 Heart disease, unspecified: Secondary | ICD-10-CM | POA: Diagnosis not present

## 2017-09-02 DIAGNOSIS — S42292A Other displaced fracture of upper end of left humerus, initial encounter for closed fracture: Secondary | ICD-10-CM | POA: Insufficient documentation

## 2017-09-02 DIAGNOSIS — I10 Essential (primary) hypertension: Secondary | ICD-10-CM

## 2017-09-02 MED ORDER — CARVEDILOL 12.5 MG PO TABS: 18.7500 mg | ORAL_TABLET | Freq: Two times a day (BID) | ORAL | 1 refills | Status: DC

## 2017-09-02 NOTE — Assessment & Plan Note (Signed)
History of nonischemic cardiomyopathy with an EF at 20% which rose to 40% by 2-D echo 02/19/17 and most recently 35-40% by recent 2-D echo 08/27/17. He does admit to dietary indiscretion with regard to alcohol and salt as well although he really denies heart failure symptoms. He has enrolled in phase 3 cardiac rehabilitation. I'm going to increase his carvedilol from 12.5-18.75 mg twice a day with the intent ultimately titrated month 25 mg twice a day. WeTalked about the  importance of salt alcohol restriction. We'll get a 2-D echo on him and 6 months and back to see me after that

## 2017-09-02 NOTE — Progress Notes (Signed)
09/02/2017 John [name]   25-Sep-1960  [id number]  Primary Physician Margaree MackintoshMcKinney, John, MD Primary Cardiologist: Runell GessJonathan J Berry MD Nicholes CalamityFACP, FACC, FAHA, [place]  HPI:  John MorinCharles [name] is a 56 y.o.  divorced Caucasian male father of 3 children with no grandchildren is accompanied by his girlfriend [name] who is an OR Engineer, civil (consulting)nurse at [employer] in [place]. He works in [employer]. [place]. I last saw him in the office 05/28/17. He has a history of treated hypertension and in the right indiscretion with regards to alcohol and salt. He presented with systolic heart failure in February of this year, was diuresed and underwent right left heart cath by myself using the femoral approach revealing normal coronary arteries, severe LV dysfunction with elevated filling pressures. His mean pulmonary capillary wedge pressure was 33 and his LVEDP was 41. He was placed on medications and discharged. 2-D echo performed in June showed improvement in LV function up to 40% although he continues to abuse salt. He continues to drink alcohol occasionally. Recent 2-D echo performed 08/27/17 revealed an EF of 35-40%. He denies heart failure symptoms however.     Current Meds  Medication Sig  . amoxicillin (AMOXIL) 500 MG capsule Take 4 capsules by mouth once. Prior to dental appt this afternoon  . aspirin 81 MG chewable tablet Chew 1 tablet (81 mg total) by mouth daily.  Marland Kitchen. buPROPion (WELLBUTRIN XL) 150 MG 24 hr tablet Take 1 tablet by mouth daily.  . carvedilol (COREG) 12.5 MG tablet Take 1.5 tablets (18.75 mg total) by mouth 2 (two) times daily.  . fluticasone (FLONASE) 50 MCG/ACT nasal spray Place 2 sprays into both nostrils daily.  . furosemide (LASIX) 40 MG tablet Take 1 tablet (40 mg total) by mouth daily.  Marland Kitchen. losartan (COZAAR) 25 MG tablet Take 1 tablet by mouth daily.  . Multiple Vitamins-Minerals (MULTIVITAMIN WITH MINERALS) tablet Take 1 tablet by mouth daily.  .  potassium chloride SA (K-DUR,KLOR-CON) 20 MEQ tablet Take 1 tablet (20 mEq total) by mouth daily.  . [DISCONTINUED] carvedilol (COREG) 6.25 MG tablet Take 12.5 mg by mouth 2 (two) times daily.  . [DISCONTINUED] oxyCODONE-acetaminophen (PERCOCET/ROXICET) 5-325 MG tablet Take 1 tablet by mouth every 6 (six) hours as needed for severe pain.     No Known Allergies  Social History   Socioeconomic History  . Marital status: Divorced    Spouse name: Not on file  . Number of children: Not on file  . Years of education: Not on file  . Highest education level: Not on file  Social Needs  . Financial resource strain: Not on file  . Food insecurity - worry: Not on file  . Food insecurity - inability: Not on file  . Transportation needs - medical: Not on file  . Transportation needs - non-medical: Not on file  Occupational History  . Not on file  Tobacco Use  . Smoking status: Never Smoker  . Smokeless tobacco: Never Used  Substance and Sexual Activity  . Alcohol use: Yes    Alcohol/week: 1.8 oz    Types: 3 Cans of beer per week    Comment: occasional  . Drug use: No  . Sexual activity: Not on file  Other Topics Concern  . Not on file  Social History Narrative  . Not on file     Review of Systems: General: negative for chills, fever, night sweats or weight changes.  Cardiovascular: negative for chest pain,  dyspnea on exertion, edema, orthopnea, palpitations, paroxysmal nocturnal dyspnea or shortness of breath Dermatological: negative for rash Respiratory: negative for cough or wheezing Urologic: negative for hematuria Abdominal: negative for nausea, vomiting, diarrhea, bright red blood per rectum, melena, or hematemesis Neurologic: negative for visual changes, syncope, or dizziness All other systems reviewed and are otherwise negative except as noted above.    Blood pressure 122/78, pulse 92, height 5\' 11"  (1.803 m), weight 230 lb (104.3 kg).  General appearance: alert and no  distress Neck: no adenopathy, no carotid bruit, no JVD, supple, symmetrical, trachea midline and thyroid not enlarged, symmetric, no tenderness/mass/nodules Lungs: clear to auscultation bilaterally Heart: regular rate and rhythm, S1, S2 normal, no murmur, click, rub or gallop Extremities: extremities normal, atraumatic, no cyanosis or edema Pulses: 2+ and symmetric Skin: Skin color, texture, turgor normal. No rashes or lesions Neurologic: Alert and oriented X 3, normal strength and tone. Normal symmetric reflexes. Normal coordination and gait  EKG not performed today  ASSESSMENT AND PLAN:   Acute systolic congestive heart failure, NYHA class 4 (HCC) History of nonischemic cardiomyopathy with an EF at 20% which rose to 40% by 2-D echo 02/19/17 and most recently 35-40% by recent 2-D echo 08/27/17. He does admit to dietary indiscretion with regard to alcohol and salt as well although he really denies heart failure symptoms. He has enrolled in phase 3 cardiac rehabilitation. I'm going to increase his carvedilol from 12.5-18.75 mg twice a day with the intent ultimately titrated month 25 mg twice a day. WeTalked about the  importance of salt alcohol restriction. We'll get a 2-D echo on him and 6 months and back to see me after that  Essential hypertension History of essential hypertension blood pressure measured 122/78. He is on losartan and carvedilol. Continue current meds current dosing. I am going to titrate his carvedilol for his nonischemic cardiomyopathy however.      Runell Gess MD FACP,FACC,FAHA, [facility] 09/02/2017 12:10 PM

## 2017-09-02 NOTE — Patient Instructions (Addendum)
Medication Instructions: Your physician recommends that you continue on your current medications as directed. Please refer to the Current Medication list given to you today.  Increase Carvedilol to 1 and 1/2 tablets (18.75 mg) twice daily.   Labwork: Your physician recommends that you return for a FASTING lipid profile and hepatic function panel at your earliest convenience.   Testing/Procedures: Your physician has requested that you have an echocardiogram. Echocardiography is a painless test that uses sound waves to create images of your heart. It provides your doctor with information about the size and shape of your heart and how well your heart's chambers and valves are working. This procedure takes approximately one hour. There are no restrictions for this procedure.  IN 6 MONTHS   Follow-Up: Your physician recommends that you schedule a follow-up appointment in: 1 month with PharmD to titrate Carvedilol.  Your physician recommends that you schedule a follow-up appointment in: 6 months with Dr. Gerrie Nordmann ECHO.   If you need a refill on your cardiac medications before your next appointment, please call your pharmacy.

## 2017-09-02 NOTE — Assessment & Plan Note (Signed)
History of essential hypertension blood pressure measured 122/78. He is on losartan and carvedilol. Continue current meds current dosing. I am going to titrate his carvedilol for his nonischemic cardiomyopathy however.

## 2017-09-03 ENCOUNTER — Encounter (HOSPITAL_COMMUNITY): Payer: 59

## 2017-09-03 DIAGNOSIS — S42202A Unspecified fracture of upper end of left humerus, initial encounter for closed fracture: Secondary | ICD-10-CM | POA: Insufficient documentation

## 2017-09-05 ENCOUNTER — Encounter (HOSPITAL_COMMUNITY): Payer: 59

## 2017-09-08 ENCOUNTER — Encounter (HOSPITAL_COMMUNITY): Payer: 59

## 2017-09-08 DIAGNOSIS — Z9889 Other specified postprocedural states: Secondary | ICD-10-CM | POA: Insufficient documentation

## 2017-09-08 DIAGNOSIS — Z8781 Personal history of (healed) traumatic fracture: Secondary | ICD-10-CM | POA: Insufficient documentation

## 2017-09-10 ENCOUNTER — Encounter (HOSPITAL_COMMUNITY): Payer: 59

## 2017-09-12 ENCOUNTER — Encounter (HOSPITAL_COMMUNITY): Payer: 59

## 2017-09-12 ENCOUNTER — Telehealth (HOSPITAL_COMMUNITY): Payer: Self-pay | Admitting: Cardiac Rehabilitation

## 2017-09-12 ENCOUNTER — Ambulatory Visit (HOSPITAL_COMMUNITY): Payer: Self-pay | Admitting: Cardiac Rehabilitation

## 2017-09-12 NOTE — Progress Notes (Signed)
Cardiac Individual Treatment Plan  Patient Details  Name: John MorinCharles Coffield MRN: [id number] Date of Birth: 1960-10-23 Referring Provider:     CARDIAC REHAB PHASE [facility]  Referring Provider  Nanetta BattyBerry, Jonathan MD      Initial Encounter Date:    [facility]  Date  06/17/17  Referring Provider  Nanetta BattyBerry, Jonathan MD      Visit Diagnosis: No diagnosis found.  Patient's Home Medications on Admission:  Current Outpatient Medications:  .  amoxicillin (AMOXIL) 500 MG capsule, Take 4 capsules by mouth once. Prior to dental appt this afternoon, Disp: , Rfl:  .  aspirin 81 MG chewable tablet, Chew 1 tablet (81 mg total) by mouth daily., Disp: , Rfl:  .  buPROPion (WELLBUTRIN XL) 150 MG 24 hr tablet, Take 1 tablet by mouth daily., Disp: , Rfl:  .  carvedilol (COREG) 12.5 MG tablet, Take 1.5 tablets (18.75 mg total) by mouth 2 (two) times daily., Disp: 90 tablet, Rfl: 1 .  fluticasone (FLONASE) 50 MCG/ACT nasal spray, Place 2 sprays into both nostrils daily., Disp: 16 g, Rfl: 3 .  furosemide (LASIX) 40 MG tablet, Take 1 tablet (40 mg total) by mouth daily., Disp: 90 tablet, Rfl: 3 .  losartan (COZAAR) 25 MG tablet, Take 1 tablet by mouth daily., Disp: , Rfl:  .  Multiple Vitamins-Minerals (MULTIVITAMIN WITH MINERALS) tablet, Take 1 tablet by mouth daily., Disp: , Rfl:  .  potassium chloride SA (K-DUR,KLOR-CON) 20 MEQ tablet, Take 1 tablet (20 mEq total) by mouth daily., Disp: 90 tablet, Rfl: 3  Past Medical History: Past Medical History:  Diagnosis Date  . Anxiety    Ativan  . Chronic combined systolic and diastolic CHF, NYHA class 3 (HCC) 10/2016   Nonischemic cardiomyopathy. EF 20-25%.  . Chronic left hip pain   . Depression    history of  . GI bleed 03/2016  . Headache   . Hypertensive heart disease with combined systolic and diastolic  congestive heart failure (HCC) 10/2016  . Nonischemic cardiomyopathy (HCC) 10/2016   Echo with EF 20-25%. Cardiac catheterization with no CAD. LVEDP was 41 mmHg, PCWP 36 mmHg  . Osteoarthritis    right shoulder  . Right hand fracture   . Seasonal allergies   . Sleep apnea    wears a CPAP  . Wears glasses     Tobacco Use: Social History   Tobacco Use  Smoking Status Never Smoker  Smokeless Tobacco Never Used    Labs: Recent Review Flowsheet Data    Labs for ITP Cardiac and Pulmonary Rehab Latest Ref Rng & Units 10/21/2016 10/21/2016   PHART 7.350 - 7.450 - 7.427   PCO2ART 32.0 - 48.0 mmHg - 41.7   HCO3 20.0 - 28.0 mmol/L 29.3(H) 27.5   TCO2 0 - 100 mmol/L 31 29   O2SAT % 61.0 99.0      Capillary Blood Glucose: No results found for: GLUCAP   Exercise Target Goals:    Exercise Program Goal: Individual exercise prescription set with THRR, safety & activity barriers. Participant demonstrates ability to understand and report RPE using BORG scale, to self-measure pulse accurately, and to acknowledge the importance of the exercise prescription.  Exercise Prescription Goal: Starting with aerobic activity 30 plus minutes a day, 3 days per week for initial exercise prescription. Provide home exercise prescription and guidelines that participant acknowledges understanding prior to discharge.  Activity Barriers & Risk Stratification:   6 Minute Walk:   Oxygen Initial Assessment:   Oxygen Re-Evaluation:   Oxygen Discharge (Final Oxygen Re-Evaluation):   Initial Exercise Prescription:   Perform Capillary Blood Glucose checks as needed.  Exercise Prescription Changes: Exercise Prescription Changes    Row Name 07/14/17 0900 08/04/17 0659 08/13/17 0700 08/20/17 0803       Response to Exercise   Blood Pressure (Admit)  108/70  108/75  130/80  130/80    Blood Pressure (Exercise)  138/80  128/78  140/94  120/80    Blood Pressure (Exit)  112/68  120/80  114/72  108/72     Heart Rate (Admit)  94 bpm  97 bpm  100 bpm  107 bpm    Heart Rate (Exercise)  115 bpm  114 bpm  119 bpm  121 bpm    Heart Rate (Exit)  87 bpm  77 bpm  80 bpm  87 bpm    Rating of Perceived Exertion (Exercise)  12  14  12  11     Symptoms  none  none  none  none    Duration  Progress to 30 minutes of  aerobic without signs/symptoms of physical distress  Progress to 30 minutes of  aerobic without signs/symptoms of physical distress  Progress to 30 minutes of  aerobic without signs/symptoms of physical distress  Progress to 30 minutes of  aerobic without signs/symptoms of physical distress    Intensity  THRR unchanged  THRR unchanged  THRR unchanged  THRR unchanged      Progression   Progression  Continue to progress workloads to maintain intensity without signs/symptoms of physical distress.  Continue to progress workloads to maintain intensity without signs/symptoms of physical distress.  Continue to progress workloads to maintain intensity without signs/symptoms of physical distress.  Continue to progress workloads to maintain intensity without signs/symptoms of physical distress.    Average METs  3.5  3.7  3.3  3.6      Resistance Training   Training Prescription  Yes  No Out early  No Relaxation today  No Relaxation today    Weight  5lbs  -  -  -    Reps  10-15  -  -  -    Time  10 Minutes  -  -  -      Interval Training   Interval Training  No  No  No  No      Bike   Level  1.7  1.7  1.7  1.7    Minutes  10  10  10  10     METs  3.98  4.01  3.98  4.05      NuStep   Level  4  5  5  5     SPM  85  85  85  85    Minutes  10  10  10  10     METs  2.3  3.6  2.9  -      Track   Laps  19  14  11  13     Minutes  6  6  6  10     METs  4.3  3.43  2.91  3.26      Home Exercise Plan   Plans to continue exercise at  Home (comment)  Home (comment)  Home (comment)  Home (comment)    Frequency  Add 3 additional days to program exercise sessions.  Add 3 additional days  to program exercise  sessions.  Add 3 additional days to program exercise sessions.  Add 3 additional days to program exercise sessions.    Initial Home Exercises Provided  07/11/17  07/11/17  07/11/17  07/11/17       Exercise Comments: Exercise Comments    Row Name 07/14/17 0654 08/01/17 0721 08/13/17 0723 09/03/17 0813     Exercise Comments  Reviewed goals with patient.  Reviewed METs and goals with patient.  Reviewed METs with patient.  Unable to review METs and Goals with patient. Pt out due to shoulder injury. Will continue to follow up with pt about returning to rehab.        Exercise Goals and Review:   Exercise Goals Re-Evaluation : Exercise Goals Re-Evaluation    Row Name 07/14/17 0654 08/04/17 0740 09/03/17 1446         Exercise Goal Re-Evaluation   Exercise Goals Review  Increase Physical Activity  Increase Physical Activity;Knowledge and understanding of Target Heart Rate Range (THRR)  -     Comments  Patient is walking 15 minutes and riding stationary bike 15 minutes on the days he doesn't attend CR.  Patient is doing well with exercise at cardiac rehab and walking and riding his stationary bike at home. Pt c/o some neck pain today. Reviewed target heart rate range with patient.  Unable to reveiw METs and goals due to patient being out for shoulder injury.      Expected Outcomes  Continue home exercise prgram riding stationary bike and walking to achieve 30 minutes of exercise on the days he doesn't attend CR.  Continue current exercise prescription 30 minutes at least 5 days/week.  Continue to follow up with patient regarding expected return date.          Discharge Exercise Prescription (Final Exercise Prescription Changes): Exercise Prescription Changes - 08/20/17 0803      Response to Exercise   Blood Pressure (Admit)  130/80    Blood Pressure (Exercise)  120/80    Blood Pressure (Exit)  108/72    Heart Rate (Admit)  107 bpm    Heart Rate (Exercise)  121 bpm    Heart Rate (Exit)   87 bpm    Rating of Perceived Exertion (Exercise)  11    Symptoms  none    Duration  Progress to 30 minutes of  aerobic without signs/symptoms of physical distress    Intensity  THRR unchanged      Progression   Progression  Continue to progress workloads to maintain intensity without signs/symptoms of physical distress.    Average METs  3.6      Resistance Training   Training Prescription  No Relaxation today      Interval Training   Interval Training  No      Bike   Level  1.7    Minutes  10    METs  4.05      NuStep   Level  5    SPM  85    Minutes  10    METs  --      Track   Laps  13    Minutes  10    METs  3.26      Home Exercise Plan   Plans to continue exercise at  Home (comment)    Frequency  Add 3 additional days to program exercise sessions.    Initial Home Exercises Provided  07/11/17       Nutrition:  Target  Goals: Understanding of nutrition guidelines, daily intake of sodium 1500mg , cholesterol 200mg , calories 30% from fat and 7% or less from saturated fats, daily to have 5 or more servings of fruits and vegetables.  Biometrics:    Nutrition Therapy Plan and Nutrition Goals:   Nutrition Discharge: Nutrition Scores:   Nutrition Goals Re-Evaluation:   Nutrition Goals Re-Evaluation:   Nutrition Goals Discharge (Final Nutrition Goals Re-Evaluation):   Psychosocial: Target Goals: Acknowledge presence or absence of significant depression and/or stress, maximize coping skills, provide positive support system. Participant is able to verbalize types and ability to use techniques and skills needed for reducing stress and depression.  Initial Review & Psychosocial Screening:   Quality of Life Scores: Quality of Life - 08/01/17 1037      Quality of Life Scores   Health/Function Pre  17.67 % pt with significant fatigue from recent cardiac event notes improvement with cardiac rehab activities. pt demonstrates eagerness to make lifestyle  modifications to decrease CAD risk factors.      Socioeconomic Pre  24.38 % pt verbalizes high stress career     Psych/Spiritual Pre  19.5 %    Family Pre  28.8 %    GLOBAL Pre  21.16 % overall pt verbalizes positive outlook with good coping skills.        PHQ-9: Recent Review Flowsheet Data    Depression screen [facility] 2/9 06/23/2017   Decreased Interest 0   Down, Depressed, Hopeless 0   PHQ - 2 Score 0     Interpretation of Total Score  Total Score Depression Severity:  1-4 = Minimal depression, 5-9 = Mild depression, 10-14 = Moderate depression, 15-19 = Moderately severe depression, 20-27 = Severe depression   Psychosocial Evaluation and Intervention:   Psychosocial Re-Evaluation: Psychosocial Re-Evaluation    Row Name 07/15/17 0735 08/13/17 0740 09/12/17 0102         Psychosocial Re-Evaluation   Current issues with  None Identified  None Identified  None Identified     Comments  no psychosocial needs identified, no interventions necessary .   no psychosocial needs identified, no interventions necessary .   no psychosocial needs identified, no interventions necessary .      Expected Outcomes  pt will exhibit positive outlook with good coping skills.   pt will exhibit positive outlook with good coping skills.   pt will exhibit positive outlook with good coping skills.      Interventions  Encouraged to attend Cardiac Rehabilitation for the exercise;Stress management education;Relaxation education  Encouraged to attend Cardiac Rehabilitation for the exercise;Stress management education;Relaxation education  Encouraged to attend Cardiac Rehabilitation for the exercise;Stress management education;Relaxation education     Continue Psychosocial Services   No Follow up required  No Follow up required  No Follow up required        Psychosocial Discharge (Final Psychosocial Re-Evaluation): Psychosocial Re-Evaluation - 09/12/17 7253      Psychosocial Re-Evaluation   Current issues with   None Identified    Comments  no psychosocial needs identified, no interventions necessary .     Expected Outcomes  pt will exhibit positive outlook with good coping skills.     Interventions  Encouraged to attend Cardiac Rehabilitation for the exercise;Stress management education;Relaxation education    Continue Psychosocial Services   No Follow up required       Vocational Rehabilitation: Provide vocational rehab assistance to qualifying candidates.   Vocational Rehab Evaluation & Intervention:   Education: Education Goals: Education classes will be  provided on a weekly basis, covering required topics. Participant will state understanding/return demonstration of topics presented.  Learning Barriers/Preferences:   Education Topics: Count Your Pulse:  -Group instruction provided by verbal instruction, demonstration, patient participation and written materials to support subject.  Instructors address importance of being able to find your pulse and how to count your pulse when at home without a heart monitor.  Patients get hands on experience counting their pulse with staff help and individually.   Heart Attack, Angina, and Risk Factor Modification:  -Group instruction provided by verbal instruction, video, and written materials to support subject.  Instructors address signs and symptoms of angina and heart attacks.    Also discuss risk factors for heart disease and how to make changes to improve heart health risk factors.   Functional Fitness:  -Group instruction provided by verbal instruction, demonstration, patient participation, and written materials to support subject.  Instructors address safety measures for doing things around the house.  Discuss how to get up and down off the floor, how to pick things up properly, how to safely get out of a chair without assistance, and balance training.   Meditation and Mindfulness:  -Group instruction provided by verbal instruction, patient  participation, and written materials to support subject.  Instructor addresses importance of mindfulness and meditation practice to help reduce stress and improve awareness.  Instructor also leads participants through a meditation exercise.    Stretching for Flexibility and Mobility:  -Group instruction provided by verbal instruction, patient participation, and written materials to support subject.  Instructors lead participants through series of stretches that are designed to increase flexibility thus improving mobility.  These stretches are additional exercise for major muscle groups that are typically performed during regular warm up and cool down.   Hands Only CPR:  -Group verbal, video, and participation provides a basic overview of AHA guidelines for community CPR. Role-play of emergencies allow participants the opportunity to practice calling for help and chest compression technique with discussion of AED use.   Hypertension: -Group verbal and written instruction that provides a basic overview of hypertension including the most recent diagnostic guidelines, risk factor reduction with self-care instructions and medication management.    Nutrition I class: Heart Healthy Eating:  -Group instruction provided by PowerPoint slides, verbal discussion, and written materials to support subject matter. The instructor gives an explanation and review of the Therapeutic Lifestyle Changes diet recommendations, which includes a discussion on lipid goals, dietary fat, sodium, fiber, plant stanol/sterol esters, sugar, and the components of a well-balanced, healthy diet.   Nutrition II class: Lifestyle Skills:  -Group instruction provided by PowerPoint slides, verbal discussion, and written materials to support subject matter. The instructor gives an explanation and review of label reading, grocery shopping for heart health, heart healthy recipe modifications, and ways to make healthier choices when eating  out.   Diabetes Question & Answer:  -Group instruction provided by PowerPoint slides, verbal discussion, and written materials to support subject matter. The instructor gives an explanation and review of diabetes co-morbidities, pre- and post-prandial blood glucose goals, pre-exercise blood glucose goals, signs, symptoms, and treatment of hypoglycemia and hyperglycemia, and foot care basics.   Diabetes Blitz:  -Group instruction provided by PowerPoint slides, verbal discussion, and written materials to support subject matter. The instructor gives an explanation and review of the physiology behind type 1 and type 2 diabetes, diabetes medications and rational behind using different medications, pre- and post-prandial blood glucose recommendations and Hemoglobin A1c goals, diabetes diet,  and exercise including blood glucose guidelines for exercising safely.    Portion Distortion:  -Group instruction provided by PowerPoint slides, verbal discussion, written materials, and food models to support subject matter. The instructor gives an explanation of serving size versus portion size, changes in portions sizes over the last 20 years, and what consists of a serving from each food group.   Stress Management:  -Group instruction provided by verbal instruction, video, and written materials to support subject matter.  Instructors review role of stress in heart disease and how to cope with stress positively.     Exercising on Your Own:  -Group instruction provided by verbal instruction, power point, and written materials to support subject.  Instructors discuss benefits of exercise, components of exercise, frequency and intensity of exercise, and end points for exercise.  Also discuss use of nitroglycerin and activating EMS.  Review options of places to exercise outside of rehab.  Review guidelines for sex with heart disease.   Cardiac Drugs I:  -Group instruction provided by verbal instruction and  written materials to support subject.  Instructor reviews cardiac drug classes: antiplatelets, anticoagulants, beta blockers, and statins.  Instructor discusses reasons, side effects, and lifestyle considerations for each drug class.   Cardiac Drugs II:  -Group instruction provided by verbal instruction and written materials to support subject.  Instructor reviews cardiac drug classes: angiotensin converting enzyme inhibitors (ACE-I), angiotensin II receptor blockers (ARBs), nitrates, and calcium channel blockers.  Instructor discusses reasons, side effects, and lifestyle considerations for each drug class.   Anatomy and Physiology of the Circulatory System:  Group verbal and written instruction and models provide basic cardiac anatomy and physiology, with the coronary electrical and arterial systems. Review of: AMI, Angina, Valve disease, Heart Failure, Peripheral Artery Disease, Cardiac Arrhythmia, Pacemakers, and the ICD.   Other Education:  -Group or individual verbal, written, or video instructions that support the educational goals of the cardiac rehab program.   Knowledge Questionnaire Score:   Core Components/Risk Factors/Patient Goals at Admission:   Core Components/Risk Factors/Patient Goals Review:  Goals and Risk Factor Review    Row Name 07/15/17 0735 08/13/17 0740 09/12/17 0732         Core Components/Risk Factors/Patient Goals Review   Personal Goals Review  Weight Management/Obesity;Hypertension;Other  Weight Management/Obesity;Hypertension;Other  Weight Management/Obesity;Hypertension;Other     Review  pt with multiple CAD RF demonstrates eagerness to participate in CR program.  pt demonstrates decreased dyspnea and fatigue with exertion   pt with multiple CAD RF demonstrates eagerness to participate in CR program.  pt demonstrates increased strength/stamina.  walking distance significantly increased. pt congratulated on 6kg weight loss thus far.    pt currently on  medical hold for shoulder injury.  pt demonstrates compliance with blood pressure management regimen.     Expected Outcomes  pt will participate in CR exercise, nutrition and lifestyle modification offerings to decrease overall RF.   pt will participate in CR exercise, nutrition and lifestyle modification offerings to decrease overall RF.   pt will participate in CR exercise, nutrition and lifestyle modification offerings to decrease overall RF.         Core Components/Risk Factors/Patient Goals at Discharge (Final Review):  Goals and Risk Factor Review - 09/12/17 0732      Core Components/Risk Factors/Patient Goals Review   Personal Goals Review  Weight Management/Obesity;Hypertension;Other    Review  pt currently on medical hold for shoulder injury.  pt demonstrates compliance with blood pressure management regimen.  Expected Outcomes  pt will participate in CR exercise, nutrition and lifestyle modification offerings to decrease overall RF.        ITP Comments: ITP Comments    Row Name 07/15/17 0735 08/13/17 0739 09/12/17 0732       ITP Comments  30 day ITP review.   No change in current regimen unless directed by Medical Director.    30 day ITP review.   pt with good attendance and participation  30 day ITP review.   pt currently out on medical hold for shoulder injury.        Comments:

## 2017-09-15 ENCOUNTER — Encounter (HOSPITAL_COMMUNITY): Payer: 59

## 2017-09-17 ENCOUNTER — Encounter (HOSPITAL_COMMUNITY): Payer: 59

## 2017-09-17 ENCOUNTER — Encounter (HOSPITAL_COMMUNITY)
Admission: RE | Admit: 2017-09-17 | Discharge: 2017-09-17 | Disposition: A | Discharge status: Home / Self Care | Payer: 59 | Source: Ambulatory Visit | Attending: Cardiology | Admitting: Cardiology

## 2017-09-17 DIAGNOSIS — I429 Cardiomyopathy, unspecified: Secondary | ICD-10-CM | POA: Insufficient documentation

## 2017-09-19 ENCOUNTER — Encounter (HOSPITAL_COMMUNITY): Payer: 59

## 2017-09-22 ENCOUNTER — Encounter (HOSPITAL_COMMUNITY): Payer: 59

## 2017-09-24 ENCOUNTER — Encounter (HOSPITAL_COMMUNITY): Payer: 59

## 2017-09-24 NOTE — Addendum Note (Signed)
Encounter addended by: York Cerise on: 09/24/2017 4:07 PM  Actions taken: Visit Navigator Flowsheet section accepted, Order Reconciliation Section accessed

## 2017-10-07 ENCOUNTER — Encounter (HOSPITAL_COMMUNITY): Payer: Self-pay

## 2017-10-07 NOTE — Progress Notes (Signed)
Discharge Progress Report  Patient Details  Name: John [name] MRN: [id number] Date of Birth: August 10, 1961 Referring Provider:     CARDIAC REHAB PHASE II ORIENTATION from 06/17/2017 in [facility] CARDIAC REHAB  Referring Provider  Nanetta Batty MD       Number of Visits: 17   Reason for Discharge:  Early Exit for shoulder injury  Smoking History:  Social History   Tobacco Use  Smoking Status Never Smoker  Smokeless Tobacco Never Used    Diagnosis:  Heart failure, chronic systolic (HCC)  ADL [facility]:   Initial Exercise Prescription: Initial Exercise Prescription - 06/17/17 1000      Date of Initial Exercise RX and Referring Provider   Date  06/17/17    Referring Provider  Nanetta Batty MD      Treadmill   MPH  3    Grade  1    Minutes  10    METs  3.71      Bike   Level  1    Minutes  10    METs  2.72      NuStep   Level  4    SPM  80    Minutes  10    METs  3      Prescription Details   Frequency (times per week)  3    Duration  Progress to 30 minutes of continuous aerobic without signs/symptoms of physical distress      Intensity   THRR 40-80% of Max Heartrate  66-132    Ratings of Perceived Exertion  11-15    Perceived Dyspnea  0-4      Progression   Progression  Continue to progress workloads to maintain intensity without signs/symptoms of physical distress.      Resistance Training   Training Prescription  Yes    Weight  5lbs    Reps  10-15       Discharge Exercise Prescription (Final Exercise Prescription Changes): Exercise Prescription Changes - 08/20/17 0803      Response to Exercise   Blood Pressure (Admit)  130/80    Blood Pressure (Exercise)  120/80    Blood Pressure (Exit)  108/72    Heart Rate (Admit)  107 bpm    Heart Rate (Exercise)  121 bpm    Heart Rate (Exit)  87 bpm    Rating of Perceived Exertion (Exercise)  11    Symptoms  none    Duration  Progress to 30 minutes of  aerobic without  signs/symptoms of physical distress    Intensity  THRR unchanged      Progression   Progression  Continue to progress workloads to maintain intensity without signs/symptoms of physical distress.    Average METs  3.6      Resistance Training   Training Prescription  No Relaxation today      Interval Training   Interval Training  No      Bike   Level  1.7    Minutes  10    METs  4.05      NuStep   Level  5    SPM  85    Minutes  10    METs  --      Track   Laps  13    Minutes  10    METs  3.26      Home Exercise Plan   Plans to continue exercise at  Home (comment)    Frequency  Add  3 additional days to program exercise sessions.    Initial Home Exercises Provided  07/11/17       Functional Capacity: 6 Minute Walk    Row Name 06/17/17 1030         6 Minute Walk   Phase  Initial     Distance  1769 feet     Distance Feet Change  1769 ft     Walk Time  6 minutes     # of Rest Breaks  0     MPH  3.4     METS  4.28     RPE  13     VO2 Peak  14.98     Symptoms  Yes (comment)     Comments  fatigue/slight breathlessness     Resting HR  82 bpm     Resting BP  114/64     Resting Oxygen Saturation   100 %     Exercise Oxygen Saturation  during 6 min walk  97 %     Max Ex. HR  121 bpm     Max Ex. BP  136/72     2 Minute Post BP  120/72        Psychological, QOL, Others - Outcomes: PHQ 2/9: Depression screen PHQ 2/9 06/23/2017  Decreased Interest 0  Down, Depressed, Hopeless 0  PHQ - 2 Score 0    Quality of Life: Quality of Life - 08/01/17 1037      Quality of Life Scores   Health/Function Pre  17.67 % pt with significant fatigue from recent cardiac event notes improvement with cardiac rehab activities. pt demonstrates eagerness to make lifestyle modifications to decrease CAD risk factors.      Socioeconomic Pre  24.38 % pt verbalizes high stress career     Psych/Spiritual Pre  19.5 %    Family Pre  28.8 %    GLOBAL Pre  21.16 % overall pt verbalizes  positive outlook with good coping skills.        Personal Goals: Goals established at orientation with interventions provided to work toward goal. Personal Goals and Risk Factors at Admission - 06/23/17 0744      Core Components/Risk Factors/Patient Goals on Admission    Weight Management  Yes;Weight Maintenance;Obesity;Weight Loss    Intervention  Weight Management: Develop a combined nutrition and exercise program designed to reach desired caloric intake, while maintaining appropriate intake of nutrient and fiber, sodium and fats, and appropriate energy expenditure required for the weight goal.    Admit Weight  246 lb 11.1 oz (111.9 kg)    Goal Weight: Short Term  236 lb (107 kg)    Goal Weight: Long Term  206 lb (93.4 kg)    Expected Outcomes  Short Term: Continue to assess and modify interventions until short term weight is achieved;Long Term: Adherence to nutrition and physical activity/exercise program aimed toward attainment of established weight goal;Weight Maintenance: Understanding of the daily nutrition guidelines, which includes 25-35% calories from fat, 7% or less cal from saturated fats, less than 200mg  cholesterol, less than 1.5gm of sodium, & 5 or more servings of fruits and vegetables daily;Weight Loss: Understanding of general recommendations for a balanced deficit meal plan, which promotes 1-2 lb weight loss per week and includes a negative energy balance of [phone] kcal/d;Understanding recommendations for meals to include 15-35% energy as protein, 25-35% energy from fat, 35-60% energy from carbohydrates, less than 200mg  of dietary cholesterol, 20-35 gm of total fiber daily;Understanding of  distribution of calorie intake throughout the day with the consumption of 4-5 meals/snacks    Hypertension  Yes    Intervention  Monitor prescription use compliance.;Provide education on lifestyle modifcations including regular physical activity/exercise, weight management, moderate sodium  restriction and increased consumption of fresh fruit, vegetables, and low fat dairy, alcohol moderation, and smoking cessation.    Expected Outcomes  Short Term: Continued assessment and intervention until BP is < 140/61mm HG in hypertensive participants. < 130/66mm HG in hypertensive participants with diabetes, heart failure or chronic kidney disease.;Long Term: Maintenance of blood pressure at goal levels.    Personal Goal Other  Yes    Personal Goal  pt personal goals are to increase EF, lose weight, decrease fear of activity to develop exercise regimen.     Intervention  Provide CR exercise, nutrition and lifestyle  modification education opportunities.    Expected Outcomes  pt will participate in CR exercise, nutrition and lifestyle modification education         Personal Goals Discharge: Goals and Risk Factor Review    Row Name 06/24/17 1509 07/15/17 0735 08/13/17 0740 09/12/17 0732 10/07/17 1403     Core Components/Risk Factors/Patient Goals Review   Personal Goals Review  Weight Management/Obesity;Hypertension;Other  Weight Management/Obesity;Hypertension;Other  Weight Management/Obesity;Hypertension;Other  Weight Management/Obesity;Hypertension;Other  -   Review  pt with multiple CAD RF demonstrates eagerness to participate in CR program.   pt with multiple CAD RF demonstrates eagerness to participate in CR program.  pt demonstrates decreased dyspnea and fatigue with exertion   pt with multiple CAD RF demonstrates eagerness to participate in CR program.  pt demonstrates increased strength/stamina.  walking distance significantly increased. pt congratulated on 6kg weight loss thus far.    pt currently on medical hold for shoulder injury.  pt demonstrates compliance with blood pressure management regimen.  pt exited program with 17 sessions. pt demonstrates compliancew with blood pressure managment regimen. pt would like to complete program when shoulder heals.    Expected Outcomes  pt will  participate in CR exercise, nutrition and lifestyle modification offerings to decrease overall RF.   pt will participate in CR exercise, nutrition and lifestyle modification offerings to decrease overall RF.   pt will participate in CR exercise, nutrition and lifestyle modification offerings to decrease overall RF.   pt will participate in CR exercise, nutrition and lifestyle modification offerings to decrease overall RF.   pt will participate in exercise, nutrition and lifestyle modification offerings to decrease overall RF.       Exercise Goals and Review: Exercise Goals    Row Name 06/17/17 1009             Exercise Goals   Increase Physical Activity  Yes       Intervention  Provide advice, education, support and counseling about physical activity/exercise needs.;Develop an individualized exercise prescription for aerobic and resistive training based on initial evaluation findings, risk stratification, comorbidities and participant's personal goals.       Expected Outcomes  Achievement of increased cardiorespiratory fitness and enhanced flexibility, muscular endurance and strength shown through measurements of functional capacity and personal statement of participant.       Increase Strength and Stamina  Yes       Intervention  Provide advice, education, support and counseling about physical activity/exercise needs.;Develop an individualized exercise prescription for aerobic and resistive training based on initial evaluation findings, risk stratification, comorbidities and participant's personal goals.       Expected Outcomes  Achievement  of increased cardiorespiratory fitness and enhanced flexibility, muscular endurance and strength shown through measurements of functional capacity and personal statement of participant.       Able to understand and use rate of perceived exertion (RPE) scale  Yes       Intervention  Provide education and explanation on how to use RPE scale       Expected  Outcomes  Short Term: Able to use RPE daily in rehab to express subjective intensity level;Long Term:  Able to use RPE to guide intensity level when exercising independently       Knowledge and understanding of Target Heart Rate Range (THRR)  Yes       Intervention  Provide education and explanation of THRR including how the numbers were predicted and where they are located for reference       Expected Outcomes  Short Term: Able to state/look up THRR;Long Term: Able to use THRR to govern intensity when exercising independently;Short Term: Able to use daily as guideline for intensity in rehab       Able to check pulse independently  Yes       Intervention  Provide education and demonstration on how to check pulse in carotid and radial arteries.;Review the importance of being able to check your own pulse for safety during independent exercise       Expected Outcomes  Short Term: Able to explain why pulse checking is important during independent exercise;Long Term: Able to check pulse independently and accurately       Understanding of Exercise Prescription  Yes       Intervention  Provide education, explanation, and written materials on patient's individual exercise prescription       Expected Outcomes  Short Term: Able to explain program exercise prescription;Long Term: Able to explain home exercise prescription to exercise independently          Nutrition & Weight - Outcomes: Pre Biometrics - 06/17/17 1009      Pre Biometrics   Waist Circumference  47.25 inches    Hip Circumference  47.5 inches    Waist to Hip Ratio  0.99 %    Triceps Skinfold  26 mm    % Body Fat  35.5 %    Grip Strength  36 kg    Flexibility  0 in    Single Leg Stand  2.31 seconds        Nutrition: Nutrition Therapy & Goals - 06/17/17 1529      Nutrition Therapy   Diet  Therapeutic Lifestyle Changes      Personal Nutrition Goals   Nutrition Goal  Pt to identify and limit food sources of saturated fat, trans fat,  and sodium    Personal Goal #2  Pt to identify food quantities necessary to achieve weight loss of 6-24 lb (2.7-10.9 kg) at graduation from cardiac rehab.       Intervention Plan   Intervention  Prescribe, educate and counsel regarding individualized specific dietary modifications aiming towards targeted core components such as weight, hypertension, lipid management, diabetes, heart failure and other comorbidities.    Expected Outcomes  Short Term Goal: Understand basic principles of dietary content, such as calories, fat, sodium, cholesterol and nutrients.;Long Term Goal: Adherence to prescribed nutrition plan.       Nutrition Discharge: Nutrition Assessments - 06/17/17 1529      MEDFICTS Scores   Pre Score  55       Education Questionnaire Score: Knowledge Questionnaire Score - 06/17/17  1610      Knowledge Questionnaire Score   Pre Score  20/24       Goals reviewed with patient; copy given to patient.

## 2017-10-07 NOTE — Addendum Note (Signed)
Encounter addended by: Robyne Peers, RN on: 10/07/2017 2:07 PM  Actions taken: Episode resolved

## 2017-10-07 NOTE — Addendum Note (Signed)
Encounter addended by: Robyne Peers, RN on: 10/07/2017 2:06 PM  Actions taken: Vitals modified, Flowsheet data copied forward, Visit Navigator Flowsheet section accepted, Sign clinical note

## 2017-10-09 DIAGNOSIS — R059 Cough, unspecified: Secondary | ICD-10-CM | POA: Insufficient documentation

## 2017-10-09 DIAGNOSIS — R05 Cough: Secondary | ICD-10-CM | POA: Insufficient documentation

## 2017-12-05 ENCOUNTER — Other Ambulatory Visit: Payer: Self-pay | Admitting: Student

## 2017-12-05 NOTE — Telephone Encounter (Signed)
REFILL 

## 2017-12-12 ENCOUNTER — Telehealth (HOSPITAL_COMMUNITY): Payer: Self-pay

## 2017-12-12 NOTE — Telephone Encounter (Signed)
Patients insurance is active through [place] - No co-pay, deductible amount of $700.00/$166.57 has been met, out of pocket amount of $3,500/$341.64 has been met, 20% co-insurance, and no pre-authorization is required.

## 2017-12-27 ENCOUNTER — Other Ambulatory Visit: Payer: Self-pay | Admitting: Cardiovascular Disease

## 2017-12-29 NOTE — Telephone Encounter (Signed)
Rx has been sent to the pharmacy electronically. ° °

## 2018-01-20 ENCOUNTER — Telehealth (HOSPITAL_COMMUNITY): Payer: Self-pay

## 2018-01-23 NOTE — Telephone Encounter (Signed)
Patient prefers to bring in a list of medications for review at Tuesday's cardiac rehab appointment.

## 2018-01-27 ENCOUNTER — Encounter (HOSPITAL_COMMUNITY)
Admission: RE | Admit: 2018-01-27 | Discharge: 2018-01-27 | Disposition: A | Discharge status: Home / Self Care | Payer: 59 | Source: Ambulatory Visit | Attending: Cardiovascular Disease | Admitting: Cardiovascular Disease

## 2018-01-27 ENCOUNTER — Encounter (HOSPITAL_COMMUNITY): Payer: Self-pay

## 2018-01-27 VITALS — BP 140/82 | HR 98 | Ht 70.0 in | Wt 241.8 lb

## 2018-01-27 DIAGNOSIS — I429 Cardiomyopathy, unspecified: Secondary | ICD-10-CM | POA: Diagnosis not present

## 2018-01-27 DIAGNOSIS — I110 Hypertensive heart disease with heart failure: Secondary | ICD-10-CM | POA: Diagnosis not present

## 2018-01-27 DIAGNOSIS — Z7982 Long term (current) use of aspirin: Secondary | ICD-10-CM | POA: Insufficient documentation

## 2018-01-27 DIAGNOSIS — I5042 Chronic combined systolic (congestive) and diastolic (congestive) heart failure: Secondary | ICD-10-CM | POA: Insufficient documentation

## 2018-01-27 DIAGNOSIS — I5022 Chronic systolic (congestive) heart failure: Secondary | ICD-10-CM

## 2018-01-27 DIAGNOSIS — Z79899 Other long term (current) drug therapy: Secondary | ICD-10-CM | POA: Insufficient documentation

## 2018-01-27 DIAGNOSIS — F419 Anxiety disorder, unspecified: Secondary | ICD-10-CM | POA: Diagnosis not present

## 2018-01-27 NOTE — Progress Notes (Signed)
John [name] 57 y.o. male DOB: Dec 06, 1960 MRN: [id number]      Nutrition Note  1. Heart failure, chronic systolic (HCC)    Past Medical History:  Diagnosis Date  . Anxiety    Ativan  . Chronic combined systolic and diastolic CHF, NYHA class 3 (HCC) 10/2016   Nonischemic cardiomyopathy. EF 20-25%.  . Chronic left hip pain   . Depression    history of  . GI bleed 03/2016  . Headache   . Hypertensive heart disease with combined systolic and diastolic congestive heart failure (HCC) 10/2016  . Nonischemic cardiomyopathy (HCC) 10/2016   Echo with EF 20-25%. Cardiac catheterization with no CAD. LVEDP was 41 mmHg, PCWP 36 mmHg  . Osteoarthritis    right shoulder  . Right hand fracture   . Seasonal allergies   . Sleep apnea    wears a CPAP  . Wears glasses    Meds reviewed.   HT: Ht Readings from Last 1 Encounters:  01/27/18 5\' 10"  (1.778 m)    WT: Wt Readings from Last 5 Encounters:  01/27/18 241 lb 13.5 oz (109.7 kg)  09/02/17 230 lb (104.3 kg)  08/23/17 240 lb (108.9 kg)  06/29/17 246 lb (111.6 kg)  06/17/17 246 lb 11.1 oz (111.9 kg)     Body mass index is 34.7 kg/m.   Current tobacco use? No   Labs:  Lipid Panel  No results found for: CHOL, TRIG, HDL, CHOLHDL, VLDL, LDLCALC, LDLDIRECT  No results found for: HGBA1C 09/09/18 A1c 5.2 CBG (last 3)  No results for input(s): GLUCAP in the last 72 hours.  Nutrition Diagnosis ? Food-and nutrition-related knowledge deficit related to lack of exposure to information as related to diagnosis of: ? CVD  ? Obesity related to excessive energy intake as evidenced by a Body mass index is 34.7 kg/m.  Nutrition Goal(s):  ? Pt to identify and limit food sources of saturated fat, trans fat, and sodium ? Pt to identify food quantities necessary to achieve weight loss of 6-24 lb (2.7-10.9 kg) at graduation from cardiac rehab. Plan:  Pt to attend nutrition classes ? Nutrition I ? Nutrition II ? Portion Distortion  Will provide  client-centered nutrition education as part of interdisciplinary care.   Monitor and evaluate progress toward nutrition goal with team.  Mickle Plumb, M.Ed, RD, LDN, CDE 01/27/2018 12:22 PM

## 2018-01-27 NOTE — Progress Notes (Signed)
Cardiac Individual Treatment Plan  Patient Details  Name: John [name] MRN: [id number] Date of Birth: [birth year] Referring Provider:     CARDIAC REHAB PHASE II ORIENTATION from 01/27/2018 in [facility] CARDIAC REHAB  Referring Provider  John Batty, MD.      Initial Encounter Date:    CARDIAC REHAB PHASE II ORIENTATION from 01/27/2018 in [facility] CARDIAC REHAB  Date  01/27/18  Referring Provider  John Batty, MD.      Visit Diagnosis: Heart failure, chronic systolic (HCC)  Patient's Home Medications on Admission:  Current Outpatient Medications:  .  amoxicillin (AMOXIL) 500 MG capsule, Take 4 capsules by mouth once. Prior to dental appt this afternoon, Disp: , Rfl:  .  aspirin 81 MG chewable tablet, Chew 1 tablet (81 mg total) by mouth daily., Disp: , Rfl:  .  buPROPion (WELLBUTRIN XL) 150 MG 24 hr tablet, Take 1 tablet by mouth daily., Disp: , Rfl:  .  carvedilol (COREG) 12.5 MG tablet, Take 1.5 tablets (18.75 mg total) by mouth 2 (two) times daily., Disp: 90 tablet, Rfl: 1 .  carvedilol (COREG) 12.5 MG tablet, TAKE 1 TABLET (12.5 MG TOTAL) BY MOUTH 2 (TWO) TIMES DAILY WITH A MEAL., Disp: 60 tablet, Rfl: 6 .  fluticasone (FLONASE) 50 MCG/ACT nasal spray, Place 2 sprays into both nostrils daily., Disp: 16 g, Rfl: 3 .  furosemide (LASIX) 40 MG tablet, TAKE 1 TABLET (40 MG TOTAL) BY MOUTH DAILY., Disp: 90 tablet, Rfl: 0 .  KLOR-CON M20 20 MEQ tablet, TAKE 1 TABLET (20 MEQ TOTAL) BY MOUTH DAILY., Disp: 90 tablet, Rfl: 0 .  losartan (COZAAR) 25 MG tablet, Take 1 tablet by mouth daily., Disp: , Rfl:  .  Multiple Vitamins-Minerals (MULTIVITAMIN WITH MINERALS) tablet, Take 1 tablet by mouth daily., Disp: , Rfl:   Past Medical History: Past Medical History:  Diagnosis Date  . Anxiety    Ativan  . Chronic combined systolic and diastolic CHF, NYHA class 3 (HCC) 10/2016   Nonischemic cardiomyopathy. EF 20-25%.  . Chronic left hip pain   .  Depression    history of  . GI bleed 03/2016  . Headache   . Hypertensive heart disease with combined systolic and diastolic congestive heart failure (HCC) 10/2016  . Nonischemic cardiomyopathy (HCC) 10/2016   Echo with EF 20-25%. Cardiac catheterization with no CAD. LVEDP was 41 mmHg, PCWP 36 mmHg  . Osteoarthritis    right shoulder  . Right hand fracture   . Seasonal allergies   . Sleep apnea    wears a CPAP  . Wears glasses     Tobacco Use: Social History   Tobacco Use  Smoking Status Never Smoker  Smokeless Tobacco Never Used    Labs: Recent Review Flowsheet Data    Labs for ITP Cardiac and Pulmonary Rehab Latest Ref Rng & Units 10/21/2016 10/21/2016   PHART 7.350 - 7.450 - 7.427   PCO2ART 32.0 - 48.0 mmHg - 41.7   HCO3 20.0 - 28.0 mmol/L 29.3(H) 27.5   TCO2 0 - 100 mmol/L 31 29   O2SAT % 61.0 99.0      Capillary Blood Glucose: No results found for: GLUCAP   Exercise Target Goals: Date: 01/27/18  Exercise Program Goal: Individual exercise prescription set using results from initial 6 min walk test and THRR while considering  patient's activity barriers and safety.   Exercise Prescription Goal: Initial exercise prescription builds to 30-45 minutes a day of aerobic activity, 2-3 days  per week.  Home exercise guidelines will be given to patient during program as part of exercise prescription that the participant will acknowledge.  Activity Barriers & Risk Stratification: Activity Barriers & Cardiac Risk Stratification - 01/27/18 0950      Activity Barriers & Cardiac Risk Stratification   Activity Barriers  Left Hip Replacement;Other (comment)    Comments  Bilateral shoulder replacement    Cardiac Risk Stratification  High       6 Minute Walk: 6 Minute Walk    Row Name 01/27/18 1150         6 Minute Walk   Phase  Initial     Distance  1326 feet     Walk Time  6 minutes     # of Rest Breaks  0     MPH  2.51     METS  3.79     RPE  14     VO2 Peak   13.28     Symptoms  Yes (comment)     Comments  Shortness of breath.     Resting HR  98 bpm     Resting BP  140/82     Resting Oxygen Saturation   98 %     Exercise Oxygen Saturation  during 6 min walk  98 %     Max Ex. HR  133 bpm     Max Ex. BP  148/90     2 Minute Post BP  142/92        Oxygen Initial Assessment:   Oxygen Re-Evaluation:   Oxygen Discharge (Final Oxygen Re-Evaluation):   Initial Exercise Prescription: Initial Exercise Prescription - 01/27/18 1200      Date of Initial Exercise RX and Referring Provider   Date  01/27/18    Referring Provider  John Batty, MD.      Bike   Level  1.2    Minutes  10    METs  3.06      NuStep   Level  3    SPM  85    Minutes  10    METs  3      Track   Laps  11    Minutes  10    METs  2.92      Prescription Details   Frequency (times per week)  3    Duration  Progress to 30 minutes of continuous aerobic without signs/symptoms of physical distress      Intensity   THRR 40-80% of Max Heartrate  66-132    Ratings of Perceived Exertion  11-13    Perceived Dyspnea  0-4      Progression   Progression  Continue to progress workloads to maintain intensity without signs/symptoms of physical distress.      Resistance Training   Training Prescription  Yes    Weight  4lbs    Reps  10-15       Perform Capillary Blood Glucose checks as needed.  Exercise Prescription Changes:   Exercise Comments:   Exercise Goals and Review: Exercise Goals    Row Name 01/27/18 1153             Exercise Goals   Increase Physical Activity  Yes       Intervention  Provide advice, education, support and counseling about physical activity/exercise needs.;Develop an individualized exercise prescription for aerobic and resistive training based on initial evaluation findings, risk stratification, comorbidities and participant's personal goals.  Expected Outcomes  Short Term: Attend rehab on a regular basis to increase  amount of physical activity.;Long Term: Exercising regularly at least 3-5 days a week.;Long Term: Add in home exercise to make exercise part of routine and to increase amount of physical activity.       Increase Strength and Stamina  Yes       Intervention  Provide advice, education, support and counseling about physical activity/exercise needs.;Develop an individualized exercise prescription for aerobic and resistive training based on initial evaluation findings, risk stratification, comorbidities and participant's personal goals.       Expected Outcomes  Short Term: Increase workloads from initial exercise prescription for resistance, speed, and METs.;Short Term: Perform resistance training exercises routinely during rehab and add in resistance training at home;Long Term: Improve cardiorespiratory fitness, muscular endurance and strength as measured by increased METs and functional capacity ( )       Able to understand and use rate of perceived exertion (RPE) scale  Yes       Intervention  Provide education and explanation on how to use RPE scale       Expected Outcomes  Short Term: Able to use RPE daily in rehab to express subjective intensity level;Long Term:  Able to use RPE to guide intensity level when exercising independently       Knowledge and understanding of Target Heart Rate Range (THRR)  Yes       Intervention  Provide education and explanation of THRR including how the numbers were predicted and where they are located for reference       Expected Outcomes  Short Term: Able to state/look up THRR;Long Term: Able to use THRR to govern intensity when exercising independently;Short Term: Able to use daily as guideline for intensity in rehab       Able to check pulse independently  Yes       Intervention  Provide education and demonstration on how to check pulse in carotid and radial arteries.;Review the importance of being able to check your own pulse for safety during independent exercise        Expected Outcomes  Short Term: Able to explain why pulse checking is important during independent exercise;Long Term: Able to check pulse independently and accurately       Understanding of Exercise Prescription  Yes       Intervention  Provide education, explanation, and written materials on patient's individual exercise prescription       Expected Outcomes  Short Term: Able to explain program exercise prescription;Long Term: Able to explain home exercise prescription to exercise independently          Exercise Goals Re-Evaluation :    Discharge Exercise Prescription (Final Exercise Prescription Changes):   Nutrition:  Target Goals: Understanding of nutrition guidelines, daily intake of sodium 1500mg , cholesterol 200mg , calories 30% from fat and 7% or less from saturated fats, daily to have 5 or more servings of fruits and vegetables.  Biometrics: Pre Biometrics - 01/27/18 0925      Pre Biometrics   Height  5\' 10"  (1.778 m)    Weight  241 lb 13.5 oz (109.7 kg)    Waist Circumference  45.75 inches    Hip Circumference  45.75 inches    Waist to Hip Ratio  1 %    BMI (Calculated)  34.7    Triceps Skinfold  23.5 mm    % Body Fat  34.1 %    Grip Strength  40.5 kg  Flexibility  0 in    Single Leg Stand  1.16 seconds        Nutrition Therapy Plan and Nutrition Goals:   Nutrition Assessments:   Nutrition Goals Re-Evaluation:   Nutrition Goals Re-Evaluation:   Nutrition Goals Discharge (Final Nutrition Goals Re-Evaluation):   Psychosocial: Target Goals: Acknowledge presence or absence of significant depression and/or stress, maximize coping skills, provide positive support system. Participant is able to verbalize types and ability to use techniques and skills needed for reducing stress and depression.  Initial Review & Psychosocial Screening: Initial Psych Review & Screening - 01/27/18 1245      Initial Review   Current issues with  History of  Depression;Current Stress Concerns    Source of Stress Concerns  Chronic Illness;Occupation;Financial      Family Dynamics   Good Support System?  Yes      Barriers   Psychosocial barriers to participate in program  The patient should benefit from training in stress management and relaxation.      Screening Interventions   Interventions  Encouraged to exercise;To provide support and resources with identified psychosocial needs;Provide feedback about the scores to participant    Expected Outcomes  Long Term Goal: Stressors or current issues are controlled or eliminated.;Short Term goal: Identification and review with participant of any Quality of Life or Depression concerns found by scoring the questionnaire.;Long Term goal: The participant improves quality of Life and PHQ9 Scores as seen by post scores and/or verbalization of changes       Quality of Life Scores: Quality of Life - 01/27/18 1205      Quality of Life Scores   Health/Function Pre  12.7 %    Socioeconomic Pre  20.5 %    Psych/Spiritual Pre  14 %    Family Pre  30 %    GLOBAL Pre  17.21 %      Scores of 19 and below usually indicate a poorer quality of life in these areas.  A difference of  2-3 points is a clinically meaningful difference.  A difference of 2-3 points in the total score of the Quality of Life Index has been associated with significant improvement in overall quality of life, self-image, physical symptoms, and general health in studies assessing change in quality of life.  PHQ-9: Recent Review Flowsheet Data    Depression screen [facility] 2/9 06/23/2017   Decreased Interest 0   Down, Depressed, Hopeless 0   PHQ - 2 Score 0     Interpretation of Total Score  Total Score Depression Severity:  1-4 = Minimal depression, 5-9 = Mild depression, 10-14 = Moderate depression, 15-19 = Moderately severe depression, 20-27 = Severe depression   Psychosocial Evaluation and Intervention:   Psychosocial  Re-Evaluation:   Psychosocial Discharge (Final Psychosocial Re-Evaluation):   Vocational Rehabilitation: Provide vocational rehab assistance to qualifying candidates.   Vocational Rehab Evaluation & Intervention:   Education: Education Goals: Education classes will be provided on a weekly basis, covering required topics. Participant will state understanding/return demonstration of topics presented.  Learning Barriers/Preferences: Learning Barriers/Preferences - 01/27/18 1203      Learning Barriers/Preferences   Learning Barriers  Sight    Learning Preferences  Skilled Demonstration;Verbal Instruction;Video;Audio       Education Topics: Count Your Pulse:  -Group instruction provided by verbal instruction, demonstration, patient participation and written materials to support subject.  Instructors address importance of being able to find your pulse and how to count your pulse when at home without  a heart monitor.  Patients get hands on experience counting their pulse with staff help and individually.   Heart Attack, Angina, and Risk Factor Modification:  -Group instruction provided by verbal instruction, video, and written materials to support subject.  Instructors address signs and symptoms of angina and heart attacks.    Also discuss risk factors for heart disease and how to make changes to improve heart health risk factors.   Functional Fitness:  -Group instruction provided by verbal instruction, demonstration, patient participation, and written materials to support subject.  Instructors address safety measures for doing things around the house.  Discuss how to get up and down off the floor, how to pick things up properly, how to safely get out of a chair without assistance, and balance training.   Meditation and Mindfulness:  -Group instruction provided by verbal instruction, patient participation, and written materials to support subject.  Instructor addresses importance of  mindfulness and meditation practice to help reduce stress and improve awareness.  Instructor also leads participants through a meditation exercise.    Stretching for Flexibility and Mobility:  -Group instruction provided by verbal instruction, patient participation, and written materials to support subject.  Instructors lead participants through series of stretches that are designed to increase flexibility thus improving mobility.  These stretches are additional exercise for major muscle groups that are typically performed during regular warm up and cool down.   Hands Only CPR:  -Group verbal, video, and participation provides a basic overview of AHA guidelines for community CPR. Role-play of emergencies allow participants the opportunity to practice calling for help and chest compression technique with discussion of AED use.   Hypertension: -Group verbal and written instruction that provides a basic overview of hypertension including the most recent diagnostic guidelines, risk factor reduction with self-care instructions and medication management.    Nutrition I class: Heart Healthy Eating:  -Group instruction provided by PowerPoint slides, verbal discussion, and written materials to support subject matter. The instructor gives an explanation and review of the Therapeutic Lifestyle Changes diet recommendations, which includes a discussion on lipid goals, dietary fat, sodium, fiber, plant stanol/sterol esters, sugar, and the components of a well-balanced, healthy diet.   Nutrition II class: Lifestyle Skills:  -Group instruction provided by PowerPoint slides, verbal discussion, and written materials to support subject matter. The instructor gives an explanation and review of label reading, grocery shopping for heart health, heart healthy recipe modifications, and ways to make healthier choices when eating out.   Diabetes Question & Answer:  -Group instruction provided by PowerPoint slides,  verbal discussion, and written materials to support subject matter. The instructor gives an explanation and review of diabetes co-morbidities, pre- and post-prandial blood glucose goals, pre-exercise blood glucose goals, signs, symptoms, and treatment of hypoglycemia and hyperglycemia, and foot care basics.   Diabetes Blitz:  -Group instruction provided by PowerPoint slides, verbal discussion, and written materials to support subject matter. The instructor gives an explanation and review of the physiology behind type 1 and type 2 diabetes, diabetes medications and rational behind using different medications, pre- and post-prandial blood glucose recommendations and Hemoglobin A1c goals, diabetes diet, and exercise including blood glucose guidelines for exercising safely.    Portion Distortion:  -Group instruction provided by PowerPoint slides, verbal discussion, written materials, and food models to support subject matter. The instructor gives an explanation of serving size versus portion size, changes in portions sizes over the last 20 years, and what consists of a serving from each food group.  Stress Management:  -Group instruction provided by verbal instruction, video, and written materials to support subject matter.  Instructors review role of stress in heart disease and how to cope with stress positively.     Exercising on Your Own:  -Group instruction provided by verbal instruction, power point, and written materials to support subject.  Instructors discuss benefits of exercise, components of exercise, frequency and intensity of exercise, and end points for exercise.  Also discuss use of nitroglycerin and activating EMS.  Review options of places to exercise outside of rehab.  Review guidelines for sex with heart disease.   Cardiac Drugs I:  -Group instruction provided by verbal instruction and written materials to support subject.  Instructor reviews cardiac drug classes: antiplatelets,  anticoagulants, beta blockers, and statins.  Instructor discusses reasons, side effects, and lifestyle considerations for each drug class.   Cardiac Drugs II:  -Group instruction provided by verbal instruction and written materials to support subject.  Instructor reviews cardiac drug classes: angiotensin converting enzyme inhibitors (ACE-I), angiotensin II receptor blockers (ARBs), nitrates, and calcium channel blockers.  Instructor discusses reasons, side effects, and lifestyle considerations for each drug class.   Anatomy and Physiology of the Circulatory System:  Group verbal and written instruction and models provide basic cardiac anatomy and physiology, with the coronary electrical and arterial systems. Review of: AMI, Angina, Valve disease, Heart Failure, Peripheral Artery Disease, Cardiac Arrhythmia, Pacemakers, and the ICD.   Other Education:  -Group or individual verbal, written, or video instructions that support the educational goals of the cardiac rehab program.   Holiday Eating Survival Tips:  -Group instruction provided by PowerPoint slides, verbal discussion, and written materials to support subject matter. The instructor gives patients tips, tricks, and techniques to help them not only survive but enjoy the holidays despite the onslaught of food that accompanies the holidays.   Knowledge Questionnaire Score: Knowledge Questionnaire Score - 01/27/18 1203      Knowledge Questionnaire Score   Pre Score  21/24       Core Components/Risk Factors/Patient Goals at Admission: Personal Goals and Risk Factors at Admission - 01/27/18 0937      Core Components/Risk Factors/Patient Goals on Admission    Weight Management  Yes;Obesity;Weight Loss    Intervention  Weight Management: Provide education and appropriate resources to help participant work on and attain dietary goals.;Weight Management: Develop a combined nutrition and exercise program designed to reach desired caloric  intake, while maintaining appropriate intake of nutrient and fiber, sodium and fats, and appropriate energy expenditure required for the weight goal.;Weight Management/Obesity: Establish reasonable short term and long term weight goals.;Obesity: Provide education and appropriate resources to help participant work on and attain dietary goals.    Admit Weight  241 lb 13.5 oz (109.7 kg)    Goal Weight: Short Term  235 lb (106.6 kg)    Goal Weight: Long Term  200 lb (90.7 kg)    Expected Outcomes  Short Term: Continue to assess and modify interventions until short term weight is achieved;Long Term: Adherence to nutrition and physical activity/exercise program aimed toward attainment of established weight goal;Weight Loss: Understanding of general recommendations for a balanced deficit meal plan, which promotes 1-2 lb weight loss per week and includes a negative energy balance of [phone] kcal/d;Understanding recommendations for meals to include 15-35% energy as protein, 25-35% energy from fat, 35-60% energy from carbohydrates, less than 200mg  of dietary cholesterol, 20-35 gm of total fiber daily;Understanding of distribution of calorie intake throughout the day with  the consumption of 4-5 meals/snacks    Hypertension  Yes    Intervention  Monitor prescription use compliance.;Provide education on lifestyle modifcations including regular physical activity/exercise, weight management, moderate sodium restriction and increased consumption of fresh fruit, vegetables, and low fat dairy, alcohol moderation, and smoking cessation.    Expected Outcomes  Short Term: Continued assessment and intervention until BP is < 140/59mm HG in hypertensive participants. < 130/21mm HG in hypertensive participants with diabetes, heart failure or chronic kidney disease.;Long Term: Maintenance of blood pressure at goal levels.       Core Components/Risk Factors/Patient Goals Review:    Core Components/Risk Factors/Patient Goals at  Discharge (Final Review):    ITP Comments: ITP Comments    Row Name 01/27/18 1146           ITP Comments  Medical Director- Dr. Armanda Magic, MD          Comments:Minas attended orientation from 0902 to 1001 to review rules and guidelines for program. Completed 6 minute walk test, Intitial ITP, and exercise prescription.  VSS. Telemetry-Sinus Rhtyhm. Mr Groeneveld did complain of shortness of breath during his walk test this resolved with rest. Mr Eschmann is resuming cardiac rehab post shoulder surgery on session Harlon Flor, RN,BSN 01/27/2018 1:00 PM

## 2018-01-28 ENCOUNTER — Encounter (HOSPITAL_COMMUNITY): Payer: 59

## 2018-01-30 ENCOUNTER — Encounter (HOSPITAL_COMMUNITY): Payer: 59

## 2018-02-02 ENCOUNTER — Encounter (HOSPITAL_COMMUNITY): Payer: 59

## 2018-02-04 ENCOUNTER — Encounter (HOSPITAL_COMMUNITY): Payer: Self-pay

## 2018-02-04 ENCOUNTER — Encounter (HOSPITAL_COMMUNITY): Payer: 59

## 2018-02-04 ENCOUNTER — Encounter (HOSPITAL_COMMUNITY)
Admission: RE | Admit: 2018-02-04 | Discharge: 2018-02-04 | Disposition: A | Discharge status: Home / Self Care | Payer: 59 | Source: Ambulatory Visit | Attending: Cardiovascular Disease | Admitting: Cardiovascular Disease

## 2018-02-04 DIAGNOSIS — I5022 Chronic systolic (congestive) heart failure: Secondary | ICD-10-CM

## 2018-02-04 DIAGNOSIS — I110 Hypertensive heart disease with heart failure: Secondary | ICD-10-CM | POA: Diagnosis not present

## 2018-02-04 NOTE — Progress Notes (Signed)
Daily Session Note  Patient Details  Name: John [name] MRN: [id number] Date of Birth: 01/27/1961 Referring Provider:     CARDIAC REHAB PHASE II ORIENTATION from 01/27/2018 in [place]  Referring Provider  Quay Burow, MD.      Encounter Date: 02/04/2018  Check In: Session Check In - 02/04/18 0715      Check-In   Location  [facility]    Staff Present  Jiles Garter, RN BSN;Tyara Nevels, MS,ACSM CEP, Exercise Physiologist;Olinty Celesta Aver, MS, ACSM CEP, Exercise Physiologist;Joann Rion, RN, BSN    Supervising physician immediately available to respond to emergencies  [facility] immediately available    Physician(s)  Dr. Wyline Copas     Medication changes reported      No    Fall or balance concerns reported     No    Tobacco Cessation  No Change    Warm-up and Cool-down  Performed as group-led instruction    Resistance Training Performed  No    VAD Patient?  No      Pain Assessment   Currently in Pain?  No/denies       Capillary Blood Glucose: No results found for this or any previous visit (from the past 24 hour(s)).    Social History   Tobacco Use  Smoking Status Never Smoker  Smokeless Tobacco Never Used    Goals Met:  Exercise tolerated well  Goals Unmet:  Not Applicable  Comments: Pt started cardiac rehab today.  Pt tolerated light exercise without difficulty. VSS, telemetry-SR, asymptomatic.  Medication list reconciled. Pt denies barriers to medicaiton compliance.  PSYCHOSOCIAL ASSESSMENT:  PHQ-0. Pt exhibits positive coping skills, hopeful outlook with supportive family. No psychosocial needs identified at this time, no psychosocial interventions necessary.  Pt oriented to exercise equipment and routine.  Understanding verbalized.   Dr. Fransico Him is Medical Director for Cardiac Rehab at [facility].

## 2018-02-06 ENCOUNTER — Encounter (HOSPITAL_COMMUNITY)
Admission: RE | Admit: 2018-02-06 | Discharge: 2018-02-06 | Disposition: A | Discharge status: Home / Self Care | Payer: 59 | Source: Ambulatory Visit | Attending: Cardiovascular Disease | Admitting: Cardiovascular Disease

## 2018-02-06 ENCOUNTER — Encounter (HOSPITAL_COMMUNITY): Payer: 59

## 2018-02-06 DIAGNOSIS — I110 Hypertensive heart disease with heart failure: Secondary | ICD-10-CM | POA: Diagnosis not present

## 2018-02-06 DIAGNOSIS — I5022 Chronic systolic (congestive) heart failure: Secondary | ICD-10-CM

## 2018-02-11 ENCOUNTER — Encounter (HOSPITAL_COMMUNITY)
Admission: RE | Admit: 2018-02-11 | Discharge: 2018-02-11 | Disposition: A | Discharge status: Home / Self Care | Payer: 59 | Source: Ambulatory Visit | Attending: Cardiovascular Disease | Admitting: Cardiovascular Disease

## 2018-02-11 ENCOUNTER — Encounter (HOSPITAL_COMMUNITY): Payer: 59

## 2018-02-11 DIAGNOSIS — I110 Hypertensive heart disease with heart failure: Secondary | ICD-10-CM | POA: Diagnosis not present

## 2018-02-11 DIAGNOSIS — I5022 Chronic systolic (congestive) heart failure: Secondary | ICD-10-CM

## 2018-02-13 ENCOUNTER — Encounter (HOSPITAL_COMMUNITY): Payer: 59

## 2018-02-13 ENCOUNTER — Encounter (HOSPITAL_COMMUNITY)
Admission: RE | Admit: 2018-02-13 | Discharge: 2018-02-13 | Disposition: A | Discharge status: Home / Self Care | Payer: 59 | Source: Ambulatory Visit | Attending: Cardiovascular Disease | Admitting: Cardiovascular Disease

## 2018-02-13 DIAGNOSIS — I110 Hypertensive heart disease with heart failure: Secondary | ICD-10-CM | POA: Diagnosis not present

## 2018-02-13 DIAGNOSIS — I5022 Chronic systolic (congestive) heart failure: Secondary | ICD-10-CM

## 2018-02-16 ENCOUNTER — Encounter (HOSPITAL_COMMUNITY): Payer: 59

## 2018-02-18 ENCOUNTER — Encounter (HOSPITAL_COMMUNITY)
Admission: RE | Admit: 2018-02-18 | Discharge: 2018-02-18 | Disposition: A | Discharge status: Home / Self Care | Payer: 59 | Source: Ambulatory Visit | Attending: Cardiovascular Disease | Admitting: Cardiovascular Disease

## 2018-02-18 ENCOUNTER — Encounter (HOSPITAL_COMMUNITY): Payer: 59

## 2018-02-18 DIAGNOSIS — I5042 Chronic combined systolic (congestive) and diastolic (congestive) heart failure: Secondary | ICD-10-CM | POA: Insufficient documentation

## 2018-02-18 DIAGNOSIS — I110 Hypertensive heart disease with heart failure: Secondary | ICD-10-CM | POA: Insufficient documentation

## 2018-02-18 DIAGNOSIS — F419 Anxiety disorder, unspecified: Secondary | ICD-10-CM | POA: Diagnosis not present

## 2018-02-18 DIAGNOSIS — I5022 Chronic systolic (congestive) heart failure: Secondary | ICD-10-CM

## 2018-02-18 DIAGNOSIS — Z79899 Other long term (current) drug therapy: Secondary | ICD-10-CM | POA: Insufficient documentation

## 2018-02-18 DIAGNOSIS — I429 Cardiomyopathy, unspecified: Secondary | ICD-10-CM | POA: Insufficient documentation

## 2018-02-18 DIAGNOSIS — Z7982 Long term (current) use of aspirin: Secondary | ICD-10-CM | POA: Insufficient documentation

## 2018-02-19 ENCOUNTER — Telehealth (HOSPITAL_COMMUNITY): Payer: Self-pay | Admitting: Internal Medicine

## 2018-02-20 ENCOUNTER — Encounter (HOSPITAL_COMMUNITY): Payer: 59

## 2018-02-23 ENCOUNTER — Encounter (HOSPITAL_COMMUNITY)
Admission: RE | Admit: 2018-02-23 | Discharge: 2018-02-23 | Disposition: A | Discharge status: Home / Self Care | Payer: 59 | Source: Ambulatory Visit | Attending: Cardiovascular Disease | Admitting: Cardiovascular Disease

## 2018-02-23 ENCOUNTER — Encounter (HOSPITAL_COMMUNITY): Payer: 59

## 2018-02-23 ENCOUNTER — Encounter (HOSPITAL_COMMUNITY): Payer: Self-pay

## 2018-02-23 DIAGNOSIS — I110 Hypertensive heart disease with heart failure: Secondary | ICD-10-CM | POA: Diagnosis not present

## 2018-02-23 DIAGNOSIS — I5022 Chronic systolic (congestive) heart failure: Secondary | ICD-10-CM

## 2018-02-25 ENCOUNTER — Encounter (HOSPITAL_COMMUNITY): Payer: 59

## 2018-02-25 ENCOUNTER — Telehealth (HOSPITAL_COMMUNITY): Payer: Self-pay | Admitting: *Deleted

## 2018-02-26 ENCOUNTER — Other Ambulatory Visit: Payer: Self-pay

## 2018-02-26 ENCOUNTER — Telehealth: Payer: Self-pay | Admitting: *Deleted

## 2018-02-26 ENCOUNTER — Ambulatory Visit (HOSPITAL_COMMUNITY): Payer: 59 | Attending: Cardiology

## 2018-02-26 DIAGNOSIS — I110 Hypertensive heart disease with heart failure: Secondary | ICD-10-CM | POA: Diagnosis not present

## 2018-02-26 DIAGNOSIS — I081 Rheumatic disorders of both mitral and tricuspid valves: Secondary | ICD-10-CM | POA: Insufficient documentation

## 2018-02-26 DIAGNOSIS — I5082 Biventricular heart failure: Secondary | ICD-10-CM | POA: Diagnosis not present

## 2018-02-26 DIAGNOSIS — I519 Heart disease, unspecified: Secondary | ICD-10-CM | POA: Diagnosis present

## 2018-02-26 DIAGNOSIS — I272 Pulmonary hypertension, unspecified: Secondary | ICD-10-CM | POA: Diagnosis not present

## 2018-02-26 MED ORDER — PERFLUTREN LIPID MICROSPHERE
1.0000 mL | INTRAVENOUS | Status: AC | PRN
  Administered 2018-02-26: 3 mL via INTRAVENOUS

## 2018-02-26 NOTE — Progress Notes (Signed)
Cardiac Individual Treatment Plan  Patient Details  Name: John [name] MRN: [id number] Date of Birth: 06/24/1961 Referring Provider:     CARDIAC REHAB PHASE II ORIENTATION from 01/27/2018 in [facility] CARDIAC REHAB  Referring Provider  Nanetta Batty, MD.      Initial Encounter Date:    CARDIAC REHAB PHASE II ORIENTATION from 01/27/2018 in [facility] CARDIAC REHAB  Date  01/27/18  Referring Provider  Nanetta Batty, MD.      Visit Diagnosis: Heart failure, chronic systolic (HCC)  Patient's Home Medications on Admission:  Current Outpatient Medications:  .  amoxicillin (AMOXIL) 500 MG capsule, Take 4 capsules by mouth once. Prior to dental appt this afternoon, Disp: , Rfl:  .  aspirin 81 MG chewable tablet, Chew 1 tablet (81 mg total) by mouth daily., Disp: , Rfl:  .  buPROPion (WELLBUTRIN XL) 150 MG 24 hr tablet, Take 1 tablet by mouth daily., Disp: , Rfl:  .  carvedilol (COREG) 12.5 MG tablet, Take 1.5 tablets (18.75 mg total) by mouth 2 (two) times daily., Disp: 90 tablet, Rfl: 1 .  carvedilol (COREG) 12.5 MG tablet, TAKE 1 TABLET (12.5 MG TOTAL) BY MOUTH 2 (TWO) TIMES DAILY WITH A MEAL. (Patient not taking: Reported on 02/04/2018), Disp: 60 tablet, Rfl: 6 .  fluticasone (FLONASE) 50 MCG/ACT nasal spray, Place 2 sprays into both nostrils daily. (Patient not taking: Reported on 02/04/2018), Disp: 16 g, Rfl: 3 .  furosemide (LASIX) 40 MG tablet, TAKE 1 TABLET (40 MG TOTAL) BY MOUTH DAILY., Disp: 90 tablet, Rfl: 0 .  KLOR-CON M20 20 MEQ tablet, TAKE 1 TABLET (20 MEQ TOTAL) BY MOUTH DAILY., Disp: 90 tablet, Rfl: 0 .  losartan (COZAAR) 25 MG tablet, Take 1 tablet by mouth daily., Disp: , Rfl:  .  Multiple Vitamins-Minerals (MULTIVITAMIN WITH MINERALS) tablet, Take 1 tablet by mouth daily., Disp: , Rfl:   Past Medical History: Past Medical History:  Diagnosis Date  . Anxiety    Ativan  . Chronic combined systolic and diastolic CHF, NYHA class 3  (HCC) 10/2016   Nonischemic cardiomyopathy. EF 20-25%.  . Chronic left hip pain   . Depression    history of  . GI bleed 03/2016  . Headache   . Hypertensive heart disease with combined systolic and diastolic congestive heart failure (HCC) 10/2016  . Nonischemic cardiomyopathy (HCC) 10/2016   Echo with EF 20-25%. Cardiac catheterization with no CAD. LVEDP was 41 mmHg, PCWP 36 mmHg  . Osteoarthritis    right shoulder  . Right hand fracture   . Seasonal allergies   . Sleep apnea    wears a CPAP  . Wears glasses     Tobacco Use: Social History   Tobacco Use  Smoking Status Never Smoker  Smokeless Tobacco Never Used    Labs: Recent Review Flowsheet Data    Labs for ITP Cardiac and Pulmonary Rehab Latest Ref Rng & Units 10/21/2016 10/21/2016   PHART 7.350 - 7.450 - 7.427   PCO2ART 32.0 - 48.0 mmHg - 41.7   HCO3 20.0 - 28.0 mmol/L 29.3(H) 27.5   TCO2 0 - 100 mmol/L 31 29   O2SAT % 61.0 99.0      Capillary Blood Glucose: No results found for: GLUCAP   Exercise Target Goals:    Exercise Program Goal: Individual exercise prescription set using results from initial 6 min walk test and THRR while considering  patient's activity barriers and safety.   Exercise Prescription Goal: Initial exercise  prescription builds to 30-45 minutes a day of aerobic activity, 2-3 days per week.  Home exercise guidelines will be given to patient during program as part of exercise prescription that the participant will acknowledge.  Activity Barriers & Risk Stratification: Activity Barriers & Cardiac Risk Stratification - 01/27/18 0950      Activity Barriers & Cardiac Risk Stratification   Activity Barriers  Left Hip Replacement;Other (comment)    Comments  Bilateral shoulder replacement    Cardiac Risk Stratification  High       6 Minute Walk: 6 Minute Walk    Row Name 01/27/18 1150         6 Minute Walk   Phase  Initial     Distance  1326 feet     Walk Time  6 minutes     # of  Rest Breaks  0     MPH  2.51     METS  3.79     RPE  14     VO2 Peak  13.28     Symptoms  Yes (comment)     Comments  Shortness of breath.     Resting HR  98 bpm     Resting BP  140/82     Resting Oxygen Saturation   98 %     Exercise Oxygen Saturation  during 6 min walk  98 %     Max Ex. HR  133 bpm     Max Ex. BP  148/90     2 Minute Post BP  142/92        Oxygen Initial Assessment:   Oxygen Re-Evaluation:   Oxygen Discharge (Final Oxygen Re-Evaluation):   Initial Exercise Prescription: Initial Exercise Prescription - 01/27/18 1200      Date of Initial Exercise RX and Referring Provider   Date  01/27/18    Referring Provider  Nanetta Batty, MD.      Bike   Level  1.2    Minutes  10    METs  3.06      NuStep   Level  3    SPM  85    Minutes  10    METs  3      Track   Laps  11    Minutes  10    METs  2.92      Prescription Details   Frequency (times per week)  3    Duration  Progress to 30 minutes of continuous aerobic without signs/symptoms of physical distress      Intensity   THRR 40-80% of Max Heartrate  66-132    Ratings of Perceived Exertion  11-13    Perceived Dyspnea  0-4      Progression   Progression  Continue to progress workloads to maintain intensity without signs/symptoms of physical distress.      Resistance Training   Training Prescription  Yes    Weight  4lbs    Reps  10-15       Perform Capillary Blood Glucose checks as needed.  Exercise Prescription Changes: Exercise Prescription Changes    Row Name 02/18/18 0732 02/23/18 1328           Response to Exercise   Blood Pressure (Admit)  124/80  124/80      Blood Pressure (Exercise)  130/80  128/70      Blood Pressure (Exit)  120/70  110/60      Heart Rate (Admit)  96 bpm  87 bpm  Heart Rate (Exercise)  118 bpm  104 bpm      Heart Rate (Exit)  71 bpm  87 bpm      Rating of Perceived Exertion (Exercise)  12  12      Perceived Dyspnea (Exercise)  0  0       Symptoms  None  None      Duration  Progress to 30 minutes of  aerobic without signs/symptoms of physical distress  Continue with 30 min of aerobic exercise without signs/symptoms of physical distress.      Intensity  THRR unchanged  THRR unchanged        Progression   Progression  Continue to progress workloads to maintain intensity without signs/symptoms of physical distress.  Continue to progress workloads to maintain intensity without signs/symptoms of physical distress.      Average METs  3.11  2.89        Resistance Training   Training Prescription  No  Yes      Weight  -  4lbs      Reps  -  10-15      Time  -  10 Minutes        Interval Training   Interval Training  No  No        Bike   Level  1.2  1.2      Minutes  10  10      METs  3.12  312        NuStep   Level  3  3      SPM  85  85      Minutes  10  10      METs  2.6  2        Track   Laps  15  12      Minutes  10  10      METs  3.61  3.07         Exercise Comments: Exercise Comments    Row Name 02/26/18 1331           Exercise Comments  Pt is responding well to exercise prescription since returning to rehab. Will follow up with pt regarding home exercise plan.           Exercise Goals and Review: Exercise Goals    Row Name 01/27/18 1153             Exercise Goals   Increase Physical Activity  Yes       Intervention  Provide advice, education, support and counseling about physical activity/exercise needs.;Develop an individualized exercise prescription for aerobic and resistive training based on initial evaluation findings, risk stratification, comorbidities and participant's personal goals.       Expected Outcomes  Short Term: Attend rehab on a regular basis to increase amount of physical activity.;Long Term: Exercising regularly at least 3-5 days a week.;Long Term: Add in home exercise to make exercise part of routine and to increase amount of physical activity.       Increase Strength and  Stamina  Yes       Intervention  Provide advice, education, support and counseling about physical activity/exercise needs.;Develop an individualized exercise prescription for aerobic and resistive training based on initial evaluation findings, risk stratification, comorbidities and participant's personal goals.       Expected Outcomes  Short Term: Increase workloads from initial exercise prescription for resistance, speed, and METs.;Short Term: Perform resistance training exercises routinely during rehab and  add in resistance training at home;Long Term: Improve cardiorespiratory fitness, muscular endurance and strength as measured by increased METs and functional capacity ( )       Able to understand and use rate of perceived exertion (RPE) scale  Yes       Intervention  Provide education and explanation on how to use RPE scale       Expected Outcomes  Short Term: Able to use RPE daily in rehab to express subjective intensity level;Long Term:  Able to use RPE to guide intensity level when exercising independently       Knowledge and understanding of Target Heart Rate Range (THRR)  Yes       Intervention  Provide education and explanation of THRR including how the numbers were predicted and where they are located for reference       Expected Outcomes  Short Term: Able to state/look up THRR;Long Term: Able to use THRR to govern intensity when exercising independently;Short Term: Able to use daily as guideline for intensity in rehab       Able to check pulse independently  Yes       Intervention  Provide education and demonstration on how to check pulse in carotid and radial arteries.;Review the importance of being able to check your own pulse for safety during independent exercise       Expected Outcomes  Short Term: Able to explain why pulse checking is important during independent exercise;Long Term: Able to check pulse independently and accurately       Understanding of Exercise Prescription  Yes        Intervention  Provide education, explanation, and written materials on patient's individual exercise prescription       Expected Outcomes  Short Term: Able to explain program exercise prescription;Long Term: Able to explain home exercise prescription to exercise independently          Exercise Goals Re-Evaluation : Exercise Goals Re-Evaluation    Row Name 02/26/18 1334             Exercise Goal Re-Evaluation   Exercise Goals Review  Understanding of Exercise Prescription;Increase Physical Activity       Comments  Pt is still limited due to shoulder. Pt is happy to be back in program. Will continue to work with pt regarding home exercise plan.       Expected Outcomes  Pt will continue to increase cardiovascular fitness and muscular strength. Will continue to monitor.            Discharge Exercise Prescription (Final Exercise Prescription Changes): Exercise Prescription Changes - 02/23/18 1328      Response to Exercise   Blood Pressure (Admit)  124/80    Blood Pressure (Exercise)  128/70    Blood Pressure (Exit)  110/60    Heart Rate (Admit)  87 bpm    Heart Rate (Exercise)  104 bpm    Heart Rate (Exit)  87 bpm    Rating of Perceived Exertion (Exercise)  12    Perceived Dyspnea (Exercise)  0    Symptoms  None    Duration  Continue with 30 min of aerobic exercise without signs/symptoms of physical distress.    Intensity  THRR unchanged      Progression   Progression  Continue to progress workloads to maintain intensity without signs/symptoms of physical distress.    Average METs  2.89      Resistance Training   Training Prescription  Yes    Weight  4lbs    Reps  10-15    Time  10 Minutes      Interval Training   Interval Training  No      Bike   Level  1.2    Minutes  10    METs  312      NuStep   Level  3    SPM  85    Minutes  10    METs  2      Track   Laps  12    Minutes  10    METs  3.07       Nutrition:  Target Goals: Understanding of  nutrition guidelines, daily intake of sodium 1500mg , cholesterol 200mg , calories 30% from fat and 7% or less from saturated fats, daily to have 5 or more servings of fruits and vegetables.  Biometrics: Pre Biometrics - 01/27/18 0925      Pre Biometrics   Height  5\' 10"  (1.778 m)    Weight  241 lb 13.5 oz (109.7 kg)    Waist Circumference  45.75 inches    Hip Circumference  45.75 inches    Waist to Hip Ratio  1 %    BMI (Calculated)  34.7    Triceps Skinfold  23.5 mm    % Body Fat  34.1 %    Grip Strength  40.5 kg    Flexibility  0 in    Single Leg Stand  1.16 seconds        Nutrition Therapy Plan and Nutrition Goals:   Nutrition Assessments:   Nutrition Goals Re-Evaluation:   Nutrition Goals Re-Evaluation:   Nutrition Goals Discharge (Final Nutrition Goals Re-Evaluation):   Psychosocial: Target Goals: Acknowledge presence or absence of significant depression and/or stress, maximize coping skills, provide positive support system. Participant is able to verbalize types and ability to use techniques and skills needed for reducing stress and depression.  Initial Review & Psychosocial Screening: Initial Psych Review & Screening - 01/27/18 1245      Initial Review   Current issues with  History of Depression;Current Stress Concerns    Source of Stress Concerns  Chronic Illness;Occupation;Financial      Family Dynamics   Good Support System?  Yes      Barriers   Psychosocial barriers to participate in program  The patient should benefit from training in stress management and relaxation.      Screening Interventions   Interventions  Encouraged to exercise;To provide support and resources with identified psychosocial needs;Provide feedback about the scores to participant    Expected Outcomes  Long Term Goal: Stressors or current issues are controlled or eliminated.;Short Term goal: Identification and review with participant of any Quality of Life or Depression concerns  found by scoring the questionnaire.;Long Term goal: The participant improves quality of Life and PHQ9 Scores as seen by post scores and/or verbalization of changes       Quality of Life Scores: Quality of Life - 01/27/18 1205      Quality of Life Scores   Health/Function Pre  12.7 %    Socioeconomic Pre  20.5 %    Psych/Spiritual Pre  14 %    Family Pre  30 %    GLOBAL Pre  17.21 %      Scores of 19 and below usually indicate a poorer quality of life in these areas.  A difference of  2-3 points is a clinically meaningful difference.  A difference of 2-3 points in  the total score of the Quality of Life Index has been associated with significant improvement in overall quality of life, self-image, physical symptoms, and general health in studies assessing change in quality of life.  PHQ-9: Recent Review Flowsheet Data    Depression screen [facility] 2/9 02/04/2018 06/23/2017   Decreased Interest 0 0   Down, Depressed, Hopeless 0 0   PHQ - 2 Score 0 0     Interpretation of Total Score  Total Score Depression Severity:  1-4 = Minimal depression, 5-9 = Mild depression, 10-14 = Moderate depression, 15-19 = Moderately severe depression, 20-27 = Severe depression   Psychosocial Evaluation and Intervention: Psychosocial Evaluation - 02/04/18 0750      Psychosocial Evaluation & Interventions   Interventions  Encouraged to exercise with the program and follow exercise prescription;Stress management education;Relaxation education    Comments  No psychosocial needs identified. No intervention necessary. Pt enjoys traveling and spending time with his children.     Expected Outcomes  Pt will exhibit a positive outlook with good coping skills.    Continue Psychosocial Services   No Follow up required       Psychosocial Re-Evaluation: Psychosocial Re-Evaluation    Row Name 02/23/18 1649             Psychosocial Re-Evaluation   Current issues with  None Identified       Comments  No  psychosocial needs identified. No interventions necessary.       Expected Outcomes  Pt will exhibit a postive outlook with good coping skills.       Interventions  Encouraged to attend Cardiac Rehabilitation for the exercise;Stress management education;Relaxation education       Continue Psychosocial Services   No Follow up required          Psychosocial Discharge (Final Psychosocial Re-Evaluation): Psychosocial Re-Evaluation - 02/23/18 1649      Psychosocial Re-Evaluation   Current issues with  None Identified    Comments  No psychosocial needs identified. No interventions necessary.    Expected Outcomes  Pt will exhibit a postive outlook with good coping skills.    Interventions  Encouraged to attend Cardiac Rehabilitation for the exercise;Stress management education;Relaxation education    Continue Psychosocial Services   No Follow up required       Vocational Rehabilitation: Provide vocational rehab assistance to qualifying candidates.   Vocational Rehab Evaluation & Intervention: Vocational Rehab - 02/04/18 1610      Initial Vocational Rehab Evaluation & Intervention   Assessment shows need for Vocational Rehabilitation  No       Education: Education Goals: Education classes will be provided on a weekly basis, covering required topics. Participant will state understanding/return demonstration of topics presented.  Learning Barriers/Preferences: Learning Barriers/Preferences - 01/27/18 1203      Learning Barriers/Preferences   Learning Barriers  Sight    Learning Preferences  Skilled Demonstration;Verbal Instruction;Video;Audio       Education Topics: Count Your Pulse:  -Group instruction provided by verbal instruction, demonstration, patient participation and written materials to support subject.  Instructors address importance of being able to find your pulse and how to count your pulse when at home without a heart monitor.  Patients get hands on experience counting  their pulse with staff help and individually.   Heart Attack, Angina, and Risk Factor Modification:  -Group instruction provided by verbal instruction, video, and written materials to support subject.  Instructors address signs and symptoms of angina and heart attacks.  Also discuss risk factors for heart disease and how to make changes to improve heart health risk factors.   Functional Fitness:  -Group instruction provided by verbal instruction, demonstration, patient participation, and written materials to support subject.  Instructors address safety measures for doing things around the house.  Discuss how to get up and down off the floor, how to pick things up properly, how to safely get out of a chair without assistance, and balance training.   Meditation and Mindfulness:  -Group instruction provided by verbal instruction, patient participation, and written materials to support subject.  Instructor addresses importance of mindfulness and meditation practice to help reduce stress and improve awareness.  Instructor also leads participants through a meditation exercise.    Stretching for Flexibility and Mobility:  -Group instruction provided by verbal instruction, patient participation, and written materials to support subject.  Instructors lead participants through series of stretches that are designed to increase flexibility thus improving mobility.  These stretches are additional exercise for major muscle groups that are typically performed during regular warm up and cool down.   Hands Only CPR:  -Group verbal, video, and participation provides a basic overview of AHA guidelines for community CPR. Role-play of emergencies allow participants the opportunity to practice calling for help and chest compression technique with discussion of AED use.   Hypertension: -Group verbal and written instruction that provides a basic overview of hypertension including the most recent diagnostic  guidelines, risk factor reduction with self-care instructions and medication management.    Nutrition I class: Heart Healthy Eating:  -Group instruction provided by PowerPoint slides, verbal discussion, and written materials to support subject matter. The instructor gives an explanation and review of the Therapeutic Lifestyle Changes diet recommendations, which includes a discussion on lipid goals, dietary fat, sodium, fiber, plant stanol/sterol esters, sugar, and the components of a well-balanced, healthy diet.   Nutrition II class: Lifestyle Skills:  -Group instruction provided by PowerPoint slides, verbal discussion, and written materials to support subject matter. The instructor gives an explanation and review of label reading, grocery shopping for heart health, heart healthy recipe modifications, and ways to make healthier choices when eating out.   Diabetes Question & Answer:  -Group instruction provided by PowerPoint slides, verbal discussion, and written materials to support subject matter. The instructor gives an explanation and review of diabetes co-morbidities, pre- and post-prandial blood glucose goals, pre-exercise blood glucose goals, signs, symptoms, and treatment of hypoglycemia and hyperglycemia, and foot care basics.   Diabetes Blitz:  -Group instruction provided by PowerPoint slides, verbal discussion, and written materials to support subject matter. The instructor gives an explanation and review of the physiology behind type 1 and type 2 diabetes, diabetes medications and rational behind using different medications, pre- and post-prandial blood glucose recommendations and Hemoglobin A1c goals, diabetes diet, and exercise including blood glucose guidelines for exercising safely.    Portion Distortion:  -Group instruction provided by PowerPoint slides, verbal discussion, written materials, and food models to support subject matter. The instructor gives an explanation of serving  size versus portion size, changes in portions sizes over the last 20 years, and what consists of a serving from each food group.   Stress Management:  -Group instruction provided by verbal instruction, video, and written materials to support subject matter.  Instructors review role of stress in heart disease and how to cope with stress positively.     Exercising on Your Own:  -Group instruction provided by verbal instruction, power point, and written materials  to support subject.  Instructors discuss benefits of exercise, components of exercise, frequency and intensity of exercise, and end points for exercise.  Also discuss use of nitroglycerin and activating EMS.  Review options of places to exercise outside of rehab.  Review guidelines for sex with heart disease.   Cardiac Drugs I:  -Group instruction provided by verbal instruction and written materials to support subject.  Instructor reviews cardiac drug classes: antiplatelets, anticoagulants, beta blockers, and statins.  Instructor discusses reasons, side effects, and lifestyle considerations for each drug class.   Cardiac Drugs II:  -Group instruction provided by verbal instruction and written materials to support subject.  Instructor reviews cardiac drug classes: angiotensin converting enzyme inhibitors (ACE-I), angiotensin II receptor blockers (ARBs), nitrates, and calcium channel blockers.  Instructor discusses reasons, side effects, and lifestyle considerations for each drug class.   Anatomy and Physiology of the Circulatory System:  Group verbal and written instruction and models provide basic cardiac anatomy and physiology, with the coronary electrical and arterial systems. Review of: AMI, Angina, Valve disease, Heart Failure, Peripheral Artery Disease, Cardiac Arrhythmia, Pacemakers, and the ICD.   Other Education:  -Group or individual verbal, written, or video instructions that support the educational goals of the cardiac rehab  program.   Holiday Eating Survival Tips:  -Group instruction provided by PowerPoint slides, verbal discussion, and written materials to support subject matter. The instructor gives patients tips, tricks, and techniques to help them not only survive but enjoy the holidays despite the onslaught of food that accompanies the holidays.   Knowledge Questionnaire Score: Knowledge Questionnaire Score - 01/27/18 1203      Knowledge Questionnaire Score   Pre Score  21/24       Core Components/Risk Factors/Patient Goals at Admission: Personal Goals and Risk Factors at Admission - 01/27/18 0937      Core Components/Risk Factors/Patient Goals on Admission    Weight Management  Yes;Obesity;Weight Loss    Intervention  Weight Management: Provide education and appropriate resources to help participant work on and attain dietary goals.;Weight Management: Develop a combined nutrition and exercise program designed to reach desired caloric intake, while maintaining appropriate intake of nutrient and fiber, sodium and fats, and appropriate energy expenditure required for the weight goal.;Weight Management/Obesity: Establish reasonable short term and long term weight goals.;Obesity: Provide education and appropriate resources to help participant work on and attain dietary goals.    Admit Weight  241 lb 13.5 oz (109.7 kg)    Goal Weight: Short Term  235 lb (106.6 kg)    Goal Weight: Long Term  200 lb (90.7 kg)    Expected Outcomes  Short Term: Continue to assess and modify interventions until short term weight is achieved;Long Term: Adherence to nutrition and physical activity/exercise program aimed toward attainment of established weight goal;Weight Loss: Understanding of general recommendations for a balanced deficit meal plan, which promotes 1-2 lb weight loss per week and includes a negative energy balance of [phone] kcal/d;Understanding recommendations for meals to include 15-35% energy as protein, 25-35%  energy from fat, 35-60% energy from carbohydrates, less than 200mg  of dietary cholesterol, 20-35 gm of total fiber daily;Understanding of distribution of calorie intake throughout the day with the consumption of 4-5 meals/snacks    Hypertension  Yes    Intervention  Monitor prescription use compliance.;Provide education on lifestyle modifcations including regular physical activity/exercise, weight management, moderate sodium restriction and increased consumption of fresh fruit, vegetables, and low fat dairy, alcohol moderation, and smoking cessation.    Expected  Outcomes  Short Term: Continued assessment and intervention until BP is < 140/92mm HG in hypertensive participants. < 130/49mm HG in hypertensive participants with diabetes, heart failure or chronic kidney disease.;Long Term: Maintenance of blood pressure at goal levels.       Core Components/Risk Factors/Patient Goals Review:  Goals and Risk Factor Review    Row Name 02/04/18 0748 02/23/18 1647           Core Components/Risk Factors/Patient Goals Review   Personal Goals Review  Weight Management/Obesity;Hypertension  Weight Management/Obesity;Hypertension      Review  Pt previously in CR Program has resumed to start on session 18.   Pt eager to participate in CR Program and get back to a healthy living.   Pt continues to be eager to participate in CR Program and has been tolerating CR exercise well.      Expected Outcomes  Pt will continue to participate in CR exercise, nutrition, and lifestyle modification opportunities.   Pt will continue to participate in CR exercise, nutrition, and lifestyle modification opportunities.          Core Components/Risk Factors/Patient Goals at Discharge (Final Review):  Goals and Risk Factor Review - 02/23/18 1647      Core Components/Risk Factors/Patient Goals Review   Personal Goals Review  Weight Management/Obesity;Hypertension    Review  Pt continues to be eager to participate in CR Program and  has been tolerating CR exercise well.    Expected Outcomes  Pt will continue to participate in CR exercise, nutrition, and lifestyle modification opportunities.        ITP Comments: ITP Comments    Row Name 01/27/18 1146 02/04/18 0746 02/23/18 1647       ITP Comments  Medical Director- Dr. Armanda Magic, MD  Charlie resumed cardiac rehab today.  He tolerated exercise well.   30 Day ITP Review. Billey Gosling is tolerated resuming cardiac rehab. His vitals signs are stable.         Comments: See ITP Comments

## 2018-02-26 NOTE — Telephone Encounter (Addendum)
Left message for pt to call    ----- Message from Runell Gess, MD sent at 02/26/2018  3:21 PM EDT ----- Return office visit with me to discuss results at next available.  Needs to be seen fairly soon

## 2018-02-27 ENCOUNTER — Encounter (HOSPITAL_COMMUNITY): Payer: 59

## 2018-02-27 ENCOUNTER — Telehealth (HOSPITAL_COMMUNITY): Payer: Self-pay | Admitting: Internal Medicine

## 2018-03-02 ENCOUNTER — Encounter (HOSPITAL_COMMUNITY): Payer: 59

## 2018-03-03 NOTE — Telephone Encounter (Signed)
OV 03/06/18 

## 2018-03-04 ENCOUNTER — Encounter (HOSPITAL_COMMUNITY): Payer: 59

## 2018-03-06 ENCOUNTER — Encounter (HOSPITAL_COMMUNITY): Payer: 59

## 2018-03-06 ENCOUNTER — Encounter: Payer: Self-pay | Admitting: Cardiovascular Disease

## 2018-03-06 ENCOUNTER — Ambulatory Visit (INDEPENDENT_AMBULATORY_CARE_PROVIDER_SITE_OTHER): Payer: 59 | Admitting: Cardiovascular Disease

## 2018-03-06 DIAGNOSIS — I5021 Acute systolic (congestive) heart failure: Secondary | ICD-10-CM | POA: Diagnosis not present

## 2018-03-06 DIAGNOSIS — I10 Essential (primary) hypertension: Secondary | ICD-10-CM | POA: Diagnosis not present

## 2018-03-06 DIAGNOSIS — I428 Other cardiomyopathies: Secondary | ICD-10-CM | POA: Diagnosis not present

## 2018-03-06 MED ORDER — CARVEDILOL 12.5 MG PO TABS: 18.7500 mg | ORAL_TABLET | Freq: Two times a day (BID) | ORAL | 1 refills | Status: DC

## 2018-03-06 MED ORDER — CARVEDILOL 25 MG PO TABS: 25.0000 mg | ORAL_TABLET | Freq: Two times a day (BID) | ORAL | 6 refills | Status: DC

## 2018-03-06 MED ORDER — SACUBITRIL-VALSARTAN 24-26 MG PO TABS: 1.0000 | ORAL_TABLET | Freq: Two times a day (BID) | ORAL | 6 refills | Status: DC

## 2018-03-06 NOTE — Addendum Note (Signed)
Addended by: Chana Bode on: 03/06/2018 04:37 PM   Modules accepted: Orders

## 2018-03-06 NOTE — Assessment & Plan Note (Signed)
John [name] has chronic systolic heart failure class III with normal coronary arteries by cardiac catheterization 10/21/2016.  His EF initially was in the 30% range however by recent echo reduced to 15 to 20%.  He does admit to dietary indiscretion.  He is gained 20 pounds over the last months.  He has chronic dyspnea on exertion.  I am going to uptitrate his carvedilol, change his Cozaar to [facility] I am going to refer him to the advanced heart failure clinic for further evaluation and treatment.  I am also going to refer him to Dr. Royann Shivers for consideration of ICD implantation for primary prevention of sudden cardiac death.

## 2018-03-06 NOTE — Progress Notes (Signed)
03/06/2018 John [name]   [birth year]  [id number]  Primary Physician Margaree Mackintosh, MD Primary Cardiologist: Runell Gess MD Nicholes Calamity, [place]  HPI:  John [name] is a 57 y.o.  divorced Caucasian male father of 3 children with no grandchildren is accompanied by his girlfriendTressawho is Health visitor care in [place]. He works in Air[employer] pilot. He relocated from [place] approximately 2 years ago. I last saw him in the office 09/02/2017. He has a history of treated hypertension and in the right indiscretion with regards to alcohol and salt. He presented with systolic heart failure in February of this year, was diuresed and underwent right left heart cath by myself using the femoral approach revealing normal coronary arteries, severe LV dysfunction with elevated filling pressures. His mean pulmonary capillary wedge pressure was 33 and his LVEDP was 41. He was placed on medications and discharged. 2-D echo performed in June showed improvement in LV function up to 40% although he continues to abuse salt. He continues to drink alcohol occasionally. Recent 2-D echo performed 08/27/17 revealed an EF of 35-40%.  Since I saw him 6 months ago he admits to dietary indiscretion.  He is been fairly sedentary.  He did break his shoulder and had pinning.  He said increasing shortness of breath and weight gain with lower extremity edema.  Recent 2D echo performed 02/26/2018 revealed a mildly dilated LV with an EF of 15 to 20%, grade 3 diastolic dysfunction and moderate pulmonary hypertension with a peak PA pressure of 53 mmHg.  This is considerably worse compared to his last echo performed in December of last year.    Current Meds  Medication Sig  . aspirin 81 MG chewable tablet Chew 1 tablet (81 mg total) by mouth daily.  Marland Kitchen buPROPion (WELLBUTRIN XL) 150 MG 24 hr tablet Take 1 tablet by mouth daily.  . carvedilol (COREG) 12.5 MG tablet Take 1.5 tablets (18.75 mg total) by mouth  2 (two) times daily.  . furosemide (LASIX) 40 MG tablet TAKE 1 TABLET (40 MG TOTAL) BY MOUTH DAILY.  Marland Kitchen KLOR-CON M20 20 MEQ tablet TAKE 1 TABLET (20 MEQ TOTAL) BY MOUTH DAILY.  . Multiple Vitamins-Minerals (MULTIVITAMIN WITH MINERALS) tablet Take 1 tablet by mouth daily.  . [DISCONTINUED] losartan (COZAAR) 25 MG tablet Take 1 tablet by mouth daily.     No Known Allergies  Social History   Socioeconomic History  . Marital status: Divorced    Spouse name: Not on file  . Number of children: Not on file  . Years of education: Not on file  . Highest education level: Not on file  Occupational History  . Not on file  Social Needs  . Financial resource strain: Not on file  . Food insecurity:    Worry: Not on file    Inability: Not on file  . Transportation needs:    Medical: Not on file    Non-medical: Not on file  Tobacco Use  . Smoking status: Never Smoker  . Smokeless tobacco: Never Used  Substance and Sexual Activity  . Alcohol use: Yes    Alcohol/week: 1.8 oz    Types: 3 Cans of beer per week    Comment: occasional  . Drug use: No  . Sexual activity: Not on file  Lifestyle  . Physical activity:    Days per week: Not on file    Minutes per session: Not on file  . Stress: Not on file  Relationships  . Social  connections:    Talks on phone: Not on file    Gets together: Not on file    Attends religious service: Not on file    Active member of club or organization: Not on file    Attends meetings of clubs or organizations: Not on file    Relationship status: Not on file  . Intimate partner violence:    Fear of current or ex partner: Not on file    Emotionally abused: Not on file    Physically abused: Not on file    Forced sexual activity: Not on file  Other Topics Concern  . Not on file  Social History Narrative  . Not on file     Review of Systems: General: negative for chills, fever, night sweats or weight changes.  Cardiovascular: negative for chest pain,  dyspnea on exertion, edema, orthopnea, palpitations, paroxysmal nocturnal dyspnea or shortness of breath Dermatological: negative for rash Respiratory: negative for cough or wheezing Urologic: negative for hematuria Abdominal: negative for nausea, vomiting, diarrhea, bright red blood per rectum, melena, or hematemesis Neurologic: negative for visual changes, syncope, or dizziness All other systems reviewed and are otherwise negative except as noted above.    Blood pressure 138/74, pulse 90, height 5\' 11"  (1.803 m), weight 250 lb (113.4 kg).  General appearance: alert and no distress Neck: no adenopathy, no carotid bruit, no JVD, supple, symmetrical, trachea midline and thyroid not enlarged, symmetric, no tenderness/mass/nodules Lungs: clear to auscultation bilaterally Heart: regular rate and rhythm, S1, S2 normal, no murmur, click, rub or gallop Extremities: 1-2+ pitting edema bilaterally Pulses: 2+ and symmetric Skin: Skin color, texture, turgor normal. No rashes or lesions Neurologic: Alert and oriented X 3, normal strength and tone. Normal symmetric reflexes. Normal coordination and gait  EKG sinus rhythm at [address] and T wave changes.  I personally reviewed this EKG.  ASSESSMENT AND PLAN:   Acute systolic congestive heart failure, NYHA class 4 (HCC) John [name] has chronic systolic heart failure class III with normal coronary arteries by cardiac catheterization 10/21/2016.  His EF initially was in the 30% range however by recent echo reduced to 15 to 20%.  He does admit to dietary indiscretion.  He is gained 20 pounds over the last months.  He has chronic dyspnea on exertion.  I am going to uptitrate his carvedilol, change his Cozaar to [facility] I am going to refer him to the advanced heart failure clinic for further evaluation and treatment.  I am also going to refer him to Dr. Royann Shivers for consideration of ICD implantation for primary prevention of sudden cardiac death.  Essential  hypertension History of essential hypertension her blood pressure measured at 138/74.  He is on carvedilol and losartan.  I am going to uptitrate his carvedilol and change losartan to Entresto.      Runell Gess MD FACP,FACC,FAHA, [facility] 03/06/2018 3:33 PM

## 2018-03-06 NOTE — Patient Instructions (Signed)
Medication Instructions: Your physician recommends that you continue on your current medications as directed. Please refer to the Current Medication list given to you today.  STOP Losartan  START Entresto 24-26 mg twice daily.  Increase Carvedilol to 25 mg twice daily.   Labwork: Your physician recommends that you return for lab work in 1 week--BMET.   Follow-Up: You have been referred to the Heart Failure Clinic and to Dr. Royann Shivers for eval for ICD Implant.  We request that you follow-up in: 1 month with an extender and in 3 months with Dr San Morelle will receive a reminder letter in the mail two months in advance. If you don't receive a letter, please call our office to schedule the follow-up appointment.  If you need a refill on your cardiac medications before your next appointment, please call your pharmacy.

## 2018-03-06 NOTE — Assessment & Plan Note (Signed)
History of essential hypertension her blood pressure measured at 138/74.  He is on carvedilol and losartan.  I am going to uptitrate his carvedilol and change losartan to Entresto.

## 2018-03-08 ENCOUNTER — Other Ambulatory Visit: Payer: Self-pay | Admitting: Student

## 2018-03-09 ENCOUNTER — Encounter (HOSPITAL_COMMUNITY): Payer: 59

## 2018-03-10 ENCOUNTER — Telehealth: Payer: Self-pay | Admitting: Cardiovascular Disease

## 2018-03-10 NOTE — Telephone Encounter (Signed)
New message    Pt would like a call back about scheduling with Dr. Kendal Hymen Dr. Salena Saner next available on August 30th. He said Dr. Allyson Sabal wanted him seen sooner than that and he doesn't think he would make it til then. Please call.

## 2018-03-10 NOTE — Telephone Encounter (Signed)
Returned call to pt he states that dr berry told him that he needs to be referred to Dr Royann Shivers appt made for 03-30-18 for this

## 2018-03-11 ENCOUNTER — Telehealth: Payer: Self-pay | Admitting: Cardiovascular Disease

## 2018-03-11 ENCOUNTER — Encounter (HOSPITAL_COMMUNITY): Payer: 59

## 2018-03-11 ENCOUNTER — Telehealth (HOSPITAL_COMMUNITY): Payer: Self-pay | Admitting: Cardiology

## 2018-03-11 NOTE — Telephone Encounter (Signed)
Can we please see if we can move up his appointments both with Dr. centimeters with a heart failure clinic since the patient is symptomatic.

## 2018-03-11 NOTE — Telephone Encounter (Signed)
New Message  Pt states that Dr. Allyson Sabal wants  Him to be seen by Dr. Royann Shivers but his earliest appt is not until 7/15 which has been made but it says its urgent and he is having Anxiety. Pt was told by his pcp that we should be able to work him in sooner. Also says Allyson Sabal was suppose to refer him to a nutritionist but never heard anything back and Dr. Clarise Cruz is not calling him back to schedule an appt. Please call

## 2018-03-11 NOTE — Telephone Encounter (Signed)
Left message for pt to call, his appointment in the heart failure clinic was moved up to 03-27-18 @ 11 am. Will keep appointment 03-30-18 with dr croitoru.

## 2018-03-11 NOTE — Telephone Encounter (Signed)
Called and left message for patient to call back.  Need to give pt New CHF appt info for 05/01/18 @10 :20 with Dr. Shirlee Latch.

## 2018-03-11 NOTE — Telephone Encounter (Signed)
Spoke with pt, aware of new appointments date and times.

## 2018-03-11 NOTE — Telephone Encounter (Signed)
Returned call to patient of Dr. Allyson Sabal. He states he is very concerned that his appt with Dr. Salena Saner to discuss ICD is not until July 15 and wants to make sure this is OK with Dr. Allyson Sabal. He is also not able to see HF Clinic Dr. Shirlee Latch until August 16. 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 He would like Dr. Allyson Sabal to review his appt dates and advise if these are OK in his opinion. He states that his PCP and sister-in-law who is a doctor state that if these issues are urgent, the doctors should be able to work him in.   He is also asking about a nutritionist referral but I did not see this in last office note  Message routed to Dr. Vernetta Honey RN for follow up on appointments, nutritionist referral and also routed to [facility] CMA in case of cancellations with Dr. Salena Saner

## 2018-03-13 ENCOUNTER — Encounter (HOSPITAL_COMMUNITY): Payer: 59

## 2018-03-16 ENCOUNTER — Encounter (HOSPITAL_COMMUNITY): Payer: 59

## 2018-03-16 ENCOUNTER — Other Ambulatory Visit: Payer: Self-pay | Admitting: Cardiovascular Disease

## 2018-03-16 LAB — BASIC METABOLIC PANEL WITH GFR
BUN: 11 mg/dL (ref 7–25)
CHLORIDE: 102 mmol/L (ref 98–110)
CO2: 26 mmol/L (ref 20–32)
CREATININE: 0.91 mg/dL (ref 0.70–1.33)
Calcium: 9.6 mg/dL (ref 8.6–10.3)
GFR, EST AFRICAN AMERICAN: 109 mL/min/{1.73_m2} (ref >=60)
GFR, EST NON AFRICAN AMERICAN: 94 mL/min/{1.73_m2} (ref >=60)
Glucose, Bld: 132 mg/dL — ABNORMAL HIGH (ref 65–99)
Potassium: 4.9 mmol/L (ref 3.5–5.3)
Sodium: 136 mmol/L (ref 135–146)

## 2018-03-18 ENCOUNTER — Encounter (HOSPITAL_COMMUNITY): Payer: 59

## 2018-03-18 NOTE — Progress Notes (Signed)
Discharge Progress Report  Patient Details  Name: John [name] MRN: [id number] Date of Birth: 04-03-1961 Referring Provider:     [place] from 01/27/2018 in [place]  Referring Provider  Quay Burow, MD.       Number of Visits: 24  Reason for Discharge:  Early Exit:  Medical reasons; Deceased EF requiring further medical workup.   Smoking History:  Social History   Tobacco Use  Smoking Status Never Smoker  Smokeless Tobacco Never Used    Diagnosis:  Heart failure, chronic systolic (HCC)  ADL UCSD:   Initial Exercise Prescription: Initial Exercise Prescription - 01/27/18 1200      Date of Initial Exercise RX and Referring Provider   Date  01/27/18    Referring Provider  Quay Burow, MD.      Bike   Level  1.2    Minutes  10    METs  3.06      NuStep   Level  3    SPM  85    Minutes  10    METs  3      Track   Laps  11    Minutes  10    METs  2.92      Prescription Details   Frequency (times per week)  3    Duration  Progress to 30 minutes of continuous aerobic without signs/symptoms of physical distress      Intensity   THRR 40-80% of Max Heartrate  66-132    Ratings of Perceived Exertion  11-13    Perceived Dyspnea  0-4      Progression   Progression  Continue to progress workloads to maintain intensity without signs/symptoms of physical distress.      Resistance Training   Training Prescription  Yes    Weight  4lbs    Reps  10-15       Discharge Exercise Prescription (Final Exercise Prescription Changes): Exercise Prescription Changes - 02/23/18 1328      Response to Exercise   Blood Pressure (Admit)  124/80    Blood Pressure (Exercise)  128/70    Blood Pressure (Exit)  110/60    Heart Rate (Admit)  87 bpm    Heart Rate (Exercise)  104 bpm    Heart Rate (Exit)  87 bpm    Rating of Perceived Exertion (Exercise)  12    Perceived Dyspnea (Exercise)  0    Symptoms   None    Duration  Continue with 30 min of aerobic exercise without signs/symptoms of physical distress.    Intensity  THRR unchanged      Progression   Progression  Continue to progress workloads to maintain intensity without signs/symptoms of physical distress.    Average METs  2.89      Resistance Training   Training Prescription  Yes    Weight  4lbs    Reps  10-15    Time  10 Minutes      Interval Training   Interval Training  No      Bike   Level  1.2    Minutes  10    METs  312      NuStep   Level  3    SPM  85    Minutes  10    METs  2      Track   Laps  12    Minutes  10    METs  3.07       Functional Capacity: 6 Minute Walk    Row Name 01/27/18 1150         6 Minute Walk   Phase  Initial     Distance  1326 feet     Walk Time  6 minutes     # of Rest Breaks  0     MPH  2.51     METS  3.79     RPE  14     VO2 Peak  13.28     Symptoms  Yes (comment)     Comments  Shortness of breath.     Resting HR  98 bpm     Resting BP  140/82     Resting Oxygen Saturation   98 %     Exercise Oxygen Saturation  during 6 min walk  98 %     Max Ex. HR  133 bpm     Max Ex. BP  148/90     2 Minute Post BP  142/92        Psychological, QOL, Others - Outcomes: PHQ 2/9: Depression screen [facility] 2/9 02/04/2018 06/23/2017  Decreased Interest 0 0  Down, Depressed, Hopeless 0 0  PHQ - 2 Score 0 0    Quality of Life: Quality of Life - 01/27/18 1205      Quality of Life Scores   Health/Function Pre  12.7 %    Socioeconomic Pre  20.5 %    Psych/Spiritual Pre  14 %    Family Pre  30 %    GLOBAL Pre  17.21 %       Personal Goals: Goals established at orientation with interventions provided to work toward goal. Personal Goals and Risk Factors at Admission - 01/27/18 0937      Core Components/Risk Factors/Patient Goals on Admission    Weight Management  Yes;Obesity;Weight Loss    Intervention  Weight Management: Provide education and appropriate resources to  help participant work on and attain dietary goals.;Weight Management: Develop a combined nutrition and exercise program designed to reach desired caloric intake, while maintaining appropriate intake of nutrient and fiber, sodium and fats, and appropriate energy expenditure required for the weight goal.;Weight Management/Obesity: Establish reasonable short term and long term weight goals.;Obesity: Provide education and appropriate resources to help participant work on and attain dietary goals.    Admit Weight  241 lb 13.5 oz (109.7 kg)    Goal Weight: Short Term  235 lb (106.6 kg)    Goal Weight: Long Term  200 lb (90.7 kg)    Expected Outcomes  Short Term: Continue to assess and modify interventions until short term weight is achieved;Long Term: Adherence to nutrition and physical activity/exercise program aimed toward attainment of established weight goal;Weight Loss: Understanding of general recommendations for a balanced deficit meal plan, which promotes 1-2 lb weight loss per week and includes a negative energy balance of [phone] kcal/d;Understanding recommendations for meals to include 15-35% energy as protein, 25-35% energy from fat, 35-60% energy from carbohydrates, less than '200mg'$  of dietary cholesterol, 20-35 gm of total fiber daily;Understanding of distribution of calorie intake throughout the day with the consumption of 4-5 meals/snacks    Hypertension  Yes    Intervention  Monitor prescription use compliance.;Provide education on lifestyle modifcations including regular physical activity/exercise, weight management, moderate sodium restriction and increased consumption of fresh fruit, vegetables, and low fat dairy, alcohol moderation, and smoking cessation.    Expected Outcomes  Short Term:  Continued assessment and intervention until BP is < 140/48m HG in hypertensive participants. < 130/825mHG in hypertensive participants with diabetes, heart failure or chronic kidney disease.;Long Term:  Maintenance of blood pressure at goal levels.        Personal Goals Discharge: Goals and Risk Factor Review    Row Name 02/04/18 0748 02/23/18 1647           Core Components/Risk Factors/Patient Goals Review   Personal Goals Review  Weight Management/Obesity;Hypertension  Weight Management/Obesity;Hypertension      Review  Pt previously in CR Program has resumed to start on session 18.   Pt eager to participate in CR Program and get back to a healthy living.   Pt continues to be eager to participate in CR Program and has been tolerating CR exercise well.      Expected Outcomes  Pt will continue to participate in CR exercise, nutrition, and lifestyle modification opportunities.   Pt will continue to participate in CR exercise, nutrition, and lifestyle modification opportunities.          Exercise Goals and Review: Exercise Goals    Row Name 01/27/18 1153             Exercise Goals   Increase Physical Activity  Yes       Intervention  Provide advice, education, support and counseling about physical activity/exercise needs.;Develop an individualized exercise prescription for aerobic and resistive training based on initial evaluation findings, risk stratification, comorbidities and participant's personal goals.       Expected Outcomes  Short Term: Attend rehab on a regular basis to increase amount of physical activity.;Long Term: Exercising regularly at least 3-5 days a week.;Long Term: Add in home exercise to make exercise part of routine and to increase amount of physical activity.       Increase Strength and Stamina  Yes       Intervention  Provide advice, education, support and counseling about physical activity/exercise needs.;Develop an individualized exercise prescription for aerobic and resistive training based on initial evaluation findings, risk stratification, comorbidities and participant's personal goals.       Expected Outcomes  Short Term: Increase workloads from initial  exercise prescription for resistance, speed, and METs.;Short Term: Perform resistance training exercises routinely during rehab and add in resistance training at home;Long Term: Improve cardiorespiratory fitness, muscular endurance and strength as measured by increased METs and functional capacity (6MWT)       Able to understand and use rate of perceived exertion (RPE) scale  Yes       Intervention  Provide education and explanation on how to use RPE scale       Expected Outcomes  Short Term: Able to use RPE daily in rehab to express subjective intensity level;Long Term:  Able to use RPE to guide intensity level when exercising independently       Knowledge and understanding of Target Heart Rate Range (THRR)  Yes       Intervention  Provide education and explanation of THRR including how the numbers were predicted and where they are located for reference       Expected Outcomes  Short Term: Able to state/look up THRR;Long Term: Able to use THRR to govern intensity when exercising independently;Short Term: Able to use daily as guideline for intensity in rehab       Able to check pulse independently  Yes       Intervention  Provide education and demonstration on how to check pulse in  carotid and radial arteries.;Review the importance of being able to check your own pulse for safety during independent exercise       Expected Outcomes  Short Term: Able to explain why pulse checking is important during independent exercise;Long Term: Able to check pulse independently and accurately       Understanding of Exercise Prescription  Yes       Intervention  Provide education, explanation, and written materials on patient's individual exercise prescription       Expected Outcomes  Short Term: Able to explain program exercise prescription;Long Term: Able to explain home exercise prescription to exercise independently          Nutrition & Weight - Outcomes: Pre Biometrics - 01/27/18 8299      Pre Biometrics    Height  '5\' 10"'$  (1.778 m)    Weight  241 lb 13.5 oz (109.7 kg)    Waist Circumference  45.75 inches    Hip Circumference  45.75 inches    Waist to Hip Ratio  1 %    BMI (Calculated)  34.7    Triceps Skinfold  23.5 mm    % Body Fat  34.1 %    Grip Strength  40.5 kg    Flexibility  0 in    Single Leg Stand  1.16 seconds        Nutrition: Nutrition Therapy & Goals - 03/12/18 1638      Nutrition Therapy   Diet  Therapeutic Lifestyle Changes      Personal Nutrition Goals   Nutrition Goal  nutrition goal not met as evidenced by increase in MEDFICTS score. Weight loss goal not met, as evidenced by an increase in weight    Personal Goal #2  Pt to identify food quantities necessary to achieve weight loss of 6-24 lb (2.7-10.9 kg) at graduation from cardiac rehab.       Intervention Plan   Intervention  Prescribe, educate and counsel regarding individualized specific dietary modifications aiming towards targeted core components such as weight, hypertension, lipid management, diabetes, heart failure and other comorbidities.    Expected Outcomes  Short Term Goal: Understand basic principles of dietary content, such as calories, fat, sodium, cholesterol and nutrients.;Long Term Goal: Adherence to prescribed nutrition plan.       Nutrition Discharge: Nutrition Assessments - 03/12/18 1636      MEDFICTS Scores   Pre Score  65    Post Score  68    Score Difference  3       Education Questionnaire Score: Knowledge Questionnaire Score - 01/27/18 1203      Knowledge Questionnaire Score   Pre Score  21/24       Goals reviewed with patient; copy given to patient.

## 2018-03-18 NOTE — Addendum Note (Signed)
Encounter addended by: Nikki Dom, RN on: 03/18/2018 4:36 PM  Actions taken: Pend clinical note, Visit Navigator Flowsheet section accepted, Sign clinical note, Episode resolved

## 2018-03-18 NOTE — Addendum Note (Signed)
Encounter addended by: Nikki Dom, RN on: 03/18/2018 3:04 PM  Actions taken: Order Reconciliation Section accessed, Sign clinical note

## 2018-03-18 NOTE — Progress Notes (Signed)
Discharge Progress Report  Patient Details  Name: John [name] MRN: [id number] Date of Birth: 1960-09-18 Referring Provider:     [place] [name] from 01/27/2018 in [place]  Referring Provider  John Burow, MD.       Number of Visits: 24  Reason for Discharge:  Early Exit:  medical reasons; Decrease in EF. John [name] needs further workup preventing him from returned to exercise per MD.   Smoking History:  Social History   Tobacco Use  Smoking Status Never Smoker  Smokeless Tobacco Never Used    Diagnosis:  Heart failure, chronic systolic (HCC)  ADL UCSD:   Initial Exercise Prescription: Initial Exercise Prescription - 01/27/18 1200      Date of Initial Exercise RX and Referring Provider   Date  01/27/18    Referring Provider  John Burow, MD.      Bike   Level  1.2    Minutes  10    METs  3.06      NuStep   Level  3    SPM  85    Minutes  10    METs  3      Track   Laps  11    Minutes  10    METs  2.92      Prescription Details   Frequency (times per week)  3    Duration  Progress to 30 minutes of continuous aerobic without signs/symptoms of physical distress      Intensity   THRR 40-80% of Max Heartrate  66-132    Ratings of Perceived Exertion  11-13    Perceived Dyspnea  0-4      Progression   Progression  Continue to progress workloads to maintain intensity without signs/symptoms of physical distress.      Resistance Training   Training Prescription  Yes    Weight  4lbs    Reps  10-15       Discharge Exercise Prescription (Final Exercise Prescription Changes): Exercise Prescription Changes - 02/23/18 1328      Response to Exercise   Blood Pressure (Admit)  124/80    Blood Pressure (Exercise)  128/70    Blood Pressure (Exit)  110/60    Heart Rate (Admit)  87 bpm    Heart Rate (Exercise)  104 bpm    Heart Rate (Exit)  87 bpm    Rating of Perceived Exertion (Exercise)   12    Perceived Dyspnea (Exercise)  0    Symptoms  None    Duration  Continue with 30 min of aerobic exercise without signs/symptoms of physical distress.    Intensity  THRR unchanged      Progression   Progression  Continue to progress workloads to maintain intensity without signs/symptoms of physical distress.    Average METs  2.89      Resistance Training   Training Prescription  Yes    Weight  4lbs    Reps  10-15    Time  10 Minutes      Interval Training   Interval Training  No      Bike   Level  1.2    Minutes  10    METs  312      NuStep   Level  3    SPM  85    Minutes  10    METs  2      Track   Laps  12  Minutes  10    METs  3.07       Functional Capacity: 6 Minute Walk    Row Name 01/27/18 1150         6 Minute Walk   Phase  Initial     Distance  1326 feet     Walk Time  6 minutes     # of Rest Breaks  0     MPH  2.51     METS  3.79     RPE  14     VO2 Peak  13.28     Symptoms  Yes (comment)     Comments  Shortness of breath.     Resting HR  98 bpm     Resting BP  140/82     Resting Oxygen Saturation   98 %     Exercise Oxygen Saturation  during 6 min walk  98 %     Max Ex. HR  133 bpm     Max Ex. BP  148/90     2 Minute Post BP  142/92        Psychological, QOL, Others - Outcomes: PHQ 2/9: Depression screen John [name] 2/9 02/04/2018 06/23/2017  Decreased Interest 0 0  Down, Depressed, Hopeless 0 0  PHQ - 2 Score 0 0    Quality of Life: Quality of Life - 01/27/18 1205      Quality of Life Scores   Health/Function Pre  12.7 %    Socioeconomic Pre  20.5 %    Psych/Spiritual Pre  14 %    Family Pre  30 %    GLOBAL Pre  17.21 %       Personal Goals: Goals established at orientation with interventions provided to work toward goal. Personal Goals and Risk Factors at Admission - 01/27/18 0937      Core Components/Risk Factors/Patient Goals on Admission    Weight Management  Yes;Obesity;Weight Loss    Intervention  Weight  Management: Provide education and appropriate resources to help participant work on and attain dietary goals.;Weight Management: Develop a combined nutrition and exercise program designed to reach desired caloric intake, while maintaining appropriate intake of nutrient and fiber, sodium and fats, and appropriate energy expenditure required for the weight goal.;Weight Management/Obesity: Establish reasonable short term and long term weight goals.;Obesity: Provide education and appropriate resources to help participant work on and attain dietary goals.    Admit Weight  241 lb 13.5 oz (109.7 kg)    Goal Weight: Short Term  235 lb (106.6 kg)    Goal Weight: Long Term  200 lb (90.7 kg)    Expected Outcomes  Short Term: Continue to assess and modify interventions until short term weight is achieved;Long Term: Adherence to nutrition and physical activity/exercise program aimed toward attainment of established weight goal;Weight Loss: Understanding of general recommendations for a balanced deficit meal plan, which promotes 1-2 lb weight loss per week and includes a negative energy balance of [phone] kcal/d;Understanding recommendations for meals to include 15-35% energy as protein, 25-35% energy from fat, 35-60% energy from carbohydrates, less than '200mg'$  of dietary cholesterol, 20-35 gm of total fiber daily;Understanding of distribution of calorie intake throughout the day with the consumption of 4-5 meals/snacks    Hypertension  Yes    Intervention  Monitor prescription use compliance.;Provide education on lifestyle modifcations including regular physical activity/exercise, weight management, moderate sodium restriction and increased consumption of fresh fruit, vegetables, and low fat dairy, alcohol moderation, and smoking cessation.  Expected Outcomes  Short Term: Continued assessment and intervention until BP is < 140/13m HG in hypertensive participants. < 130/834mHG in hypertensive participants with  diabetes, heart failure or chronic kidney disease.;Long Term: Maintenance of blood pressure at goal levels.        Personal Goals Discharge: Goals and Risk Factor Review    Row Name 02/04/18 0748 02/23/18 1647           Core Components/Risk Factors/Patient Goals Review   Personal Goals Review  Weight Management/Obesity;Hypertension  Weight Management/Obesity;Hypertension      Review  Pt previously in CR Program has resumed to start on session 18.   Pt eager to participate in CR Program and get back to a healthy living.   Pt continues to be eager to participate in CR Program and has been tolerating CR exercise well.      Expected Outcomes  Pt will continue to participate in CR exercise, nutrition, and lifestyle modification opportunities.   Pt will continue to participate in CR exercise, nutrition, and lifestyle modification opportunities.          Exercise Goals and Review: Exercise Goals    Row Name 01/27/18 1153             Exercise Goals   Increase Physical Activity  Yes       Intervention  Provide advice, education, support and counseling about physical activity/exercise needs.;Develop an individualized exercise prescription for aerobic and resistive training based on initial evaluation findings, risk stratification, comorbidities and participant's personal goals.       Expected Outcomes  Short Term: Attend rehab on a regular basis to increase amount of physical activity.;Long Term: Exercising regularly at least 3-5 days a week.;Long Term: Add in home exercise to make exercise part of routine and to increase amount of physical activity.       Increase Strength and Stamina  Yes       Intervention  Provide advice, education, support and counseling about physical activity/exercise needs.;Develop an individualized exercise prescription for aerobic and resistive training based on initial evaluation findings, risk stratification, comorbidities and participant's personal goals.        Expected Outcomes  Short Term: Increase workloads from initial exercise prescription for resistance, speed, and METs.;Short Term: Perform resistance training exercises routinely during rehab and add in resistance training at home;Long Term: Improve cardiorespiratory fitness, muscular endurance and strength as measured by increased METs and functional capacity (6MWT)       Able to understand and use rate of perceived exertion (RPE) scale  Yes       Intervention  Provide education and explanation on how to use RPE scale       Expected Outcomes  Short Term: Able to use RPE daily in rehab to express subjective intensity level;Long Term:  Able to use RPE to guide intensity level when exercising independently       Knowledge and understanding of Target Heart Rate Range (THRR)  Yes       Intervention  Provide education and explanation of THRR including how the numbers were predicted and where they are located for reference       Expected Outcomes  Short Term: Able to state/look up THRR;Long Term: Able to use THRR to govern intensity when exercising independently;Short Term: Able to use daily as guideline for intensity in rehab       Able to check pulse independently  Yes       Intervention  Provide education and demonstration on  how to check pulse in carotid and radial arteries.;Review the importance of being able to check your own pulse for safety during independent exercise       Expected Outcomes  Short Term: Able to explain why pulse checking is important during independent exercise;Long Term: Able to check pulse independently and accurately       Understanding of Exercise Prescription  Yes       Intervention  Provide education, explanation, and written materials on patient's individual exercise prescription       Expected Outcomes  Short Term: Able to explain program exercise prescription;Long Term: Able to explain home exercise prescription to exercise independently          Nutrition & Weight -  Outcomes: Pre Biometrics - 01/27/18 1117      Pre Biometrics   Height  '5\' 10"'$  (1.778 m)    Weight  241 lb 13.5 oz (109.7 kg)    Waist Circumference  45.75 inches    Hip Circumference  45.75 inches    Waist to Hip Ratio  1 %    BMI (Calculated)  34.7    Triceps Skinfold  23.5 mm    % Body Fat  34.1 %    Grip Strength  40.5 kg    Flexibility  0 in    Single Leg Stand  1.16 seconds        Nutrition: Nutrition Therapy & Goals - 03/12/18 1638      Nutrition Therapy   Diet  Therapeutic Lifestyle Changes      Personal Nutrition Goals   Nutrition Goal  nutrition goal not met as evidenced by increase in MEDFICTS score. Weight loss goal not met, as evidenced by an increase in weight    Personal Goal #2  Pt to identify food quantities necessary to achieve weight loss of 6-24 lb (2.7-10.9 kg) at graduation from cardiac rehab.       Intervention Plan   Intervention  Prescribe, educate and counsel regarding individualized specific dietary modifications aiming towards targeted core components such as weight, hypertension, lipid management, diabetes, heart failure and other comorbidities.    Expected Outcomes  Short Term Goal: Understand basic principles of dietary content, such as calories, fat, sodium, cholesterol and nutrients.;Long Term Goal: Adherence to prescribed nutrition plan.       Nutrition Discharge: Nutrition Assessments - 03/12/18 1636      MEDFICTS Scores   Pre Score  65    Post Score  68    Score Difference  3       Education Questionnaire Score: Knowledge Questionnaire Score - 01/27/18 1203      Knowledge Questionnaire Score   Pre Score  21/24       Goals reviewed with patient; copy given to patient.

## 2018-03-20 ENCOUNTER — Encounter (HOSPITAL_COMMUNITY): Payer: 59

## 2018-03-23 ENCOUNTER — Encounter (HOSPITAL_COMMUNITY): Payer: 59

## 2018-03-25 ENCOUNTER — Encounter (HOSPITAL_COMMUNITY): Payer: 59

## 2018-03-27 ENCOUNTER — Encounter (HOSPITAL_COMMUNITY): Payer: 59

## 2018-03-27 ENCOUNTER — Ambulatory Visit (HOSPITAL_COMMUNITY)
Admission: RE | Admit: 2018-03-27 | Discharge: 2018-03-27 | Disposition: A | Discharge status: Home / Self Care | Payer: 59 | Source: Ambulatory Visit | Attending: Cardiology | Admitting: Cardiology

## 2018-03-27 ENCOUNTER — Encounter (HOSPITAL_COMMUNITY): Payer: Self-pay | Admitting: Cardiology

## 2018-03-27 ENCOUNTER — Other Ambulatory Visit: Payer: Self-pay

## 2018-03-27 VITALS — BP 121/82 | Wt 240.8 lb

## 2018-03-27 DIAGNOSIS — I5082 Biventricular heart failure: Secondary | ICD-10-CM | POA: Diagnosis not present

## 2018-03-27 DIAGNOSIS — F329 Major depressive disorder, single episode, unspecified: Secondary | ICD-10-CM | POA: Diagnosis not present

## 2018-03-27 DIAGNOSIS — Z79899 Other long term (current) drug therapy: Secondary | ICD-10-CM | POA: Insufficient documentation

## 2018-03-27 DIAGNOSIS — I5022 Chronic systolic (congestive) heart failure: Secondary | ICD-10-CM

## 2018-03-27 DIAGNOSIS — Z7982 Long term (current) use of aspirin: Secondary | ICD-10-CM | POA: Diagnosis not present

## 2018-03-27 DIAGNOSIS — I5021 Acute systolic (congestive) heart failure: Secondary | ICD-10-CM | POA: Diagnosis not present

## 2018-03-27 DIAGNOSIS — I428 Other cardiomyopathies: Secondary | ICD-10-CM | POA: Diagnosis not present

## 2018-03-27 DIAGNOSIS — G4733 Obstructive sleep apnea (adult) (pediatric): Secondary | ICD-10-CM | POA: Insufficient documentation

## 2018-03-27 MED ORDER — SPIRONOLACTONE 25 MG PO TABS: 12.5000 mg | ORAL_TABLET | Freq: Every day | ORAL | 3 refills | Status: DC

## 2018-03-27 MED ORDER — FUROSEMIDE 40 MG PO TABS: 40.0000 mg | ORAL_TABLET | Freq: Two times a day (BID) | ORAL | 1 refills | Status: DC

## 2018-03-27 NOTE — Patient Instructions (Signed)
START spironolactone 12.5mg  daily.  INCREASE Lasix to 40mg  twice daily.  Your provider requests you have a Cardiopulmonary exercise test. (CPX)  Your provider requests you have a Cardiac MRI. (they will call you to schedule appointment)  Your provider requests you restart Cardiac Rehab. (they will contact you to schedule)  Lab work in 1 week. (bmet)  Follow up with Dr.McLean in 2 weeks.

## 2018-03-27 NOTE — Progress Notes (Signed)
ReDS Vest - 03/27/18 1100      ReDS Vest   MR   No    Fitting Posture  Sitting    Ruler Value  -- station D, measurement 32   station D, measurement 32   ReDS Value  38

## 2018-03-29 NOTE — Progress Notes (Signed)
PCP: Dr. Nolen Mu Cardiology: Dr. Allyson Sabal HF Cardiology: Dr. Shirlee Latch  57 y.o. with history of nonischemic cardiomyopathy and OSA was referred by Dr. Allyson Sabal for evaluation of CHF.  He was initially diagnosed in early 2/18.  He had URI symptoms then developed dyspnea and put on weight. Echo was done in 2/18, showing EF 20-25%.  LHC in 2/18 showed normal coronaries. He was started on treatment for cardiomyopathy, and EF rose to 40% by 6/18. However, most recent echo in 6/19 shows EF back down to 15-20% with severe RV dilation/dysfunction.    Symptoms have been worse of the last few weeks. He has been under stress at work.  He is short of breath now with minimal exertion, dyspneic after walking 50 feet.  Short of breath with stairs.  +Cough.  No orthopnea/PND. He has nausea in the mornings.  No chest pain.  Weight is up.  He has about 3 drinks a week and was never a heavy drinker.  His symptoms are somewhat better on Entresto and Lasix, but he is still limited.   Labs (7/19): K 4.9, creatinine 0.91  ECG (6/19, personally reviewed): NSR, inferolateral nonspecific T wave changes.   REDS vest reading: 38%  PMH: 1. OSA: uses CPAP.  2. Depression 3. Chronic systolic CHF: Nonischemic cardiomyopathy.  Diagnosed around 2/18.  - LHC (2/18): Normal coronaries.  - Echo (2/18): EF 20-25% - Echo (6/18): EF 40% - Echo (12/18): EF 35-40% - Echo (6/19): LV mildly dilated, EF 15-20%, RV severely dilated with severely decreased systolic function.   SH: Originally from [place], divorced with 3 kids, works in sales/logistics, never smoked, 3 drinks/week. No drugs. Lives in [place].   FH: Sister with congenital heart abnormality.  No other cardiac disease.   ROS: All systems reviewed and negative except as per HPI.  Current Outpatient Medications  Medication Sig Dispense Refill  . aspirin 81 MG chewable tablet Chew 1 tablet (81 mg total) by mouth daily.    Marland Kitchen buPROPion (WELLBUTRIN XL) 150 MG 24 hr tablet Take 1  tablet by mouth daily.    . carvedilol (COREG) 25 MG tablet Take 1 tablet (25 mg total) by mouth 2 (two) times daily with a meal. 60 tablet 6  . furosemide (LASIX) 40 MG tablet Take 1 tablet (40 mg total) by mouth 2 (two) times daily. 180 tablet 1  . KLOR-CON M20 20 MEQ tablet TAKE 1 TABLET (20 MEQ TOTAL) BY MOUTH DAILY. 90 tablet 1  . Multiple Vitamins-Minerals (MULTIVITAMIN WITH MINERALS) tablet Take 1 tablet by mouth daily.    . sacubitril-valsartan (ENTRESTO) 24-26 MG Take 1 tablet by mouth 2 (two) times daily. 60 tablet 6  . spironolactone (ALDACTONE) 25 MG tablet Take 0.5 tablets (12.5 mg total) by mouth daily. 15 tablet 3   No current facility-administered medications for this encounter.    BP 121/82   Wt 240 lb 12 oz (109.2 kg)   SpO2 98%   BMI 33.58 kg/m  General: NAD Neck: Thick, JVP difficult, no thyromegaly or thyroid nodule.  Lungs: Clear to auscultation bilaterally with normal respiratory effort. CV: Nondisplaced PMI.  Heart regular S1/S2, no S3/S4, no murmur.  1+ edema 1/2 to knees bilaterally.  No carotid bruit.  Normal pedal pulses.  Abdomen: Soft, nontender, no hepatosplenomegaly, no distention.  Skin: Intact without lesions or rashes.  Neurologic: Alert and oriented x 3.  Psych: Normal affect. Extremities: No clubbing or cyanosis.  HEENT: Normal.   Assessment/Plan: 1. Chronic systolic CHF: Nonischemic cardiomyopathy with  biventricular failure.  Cath in 2/18 with no coronary disease.  Echo (6/19) with Ef 15-20%, severe RV dilation/dysfunction.  NYHA class III symptoms, he is volume overloaded by exam and by REDS vest reading (though not markedly wet by vest).  Cause of CMP is uncertain, never a very heavy drinker and no drugs.  No family history.  Possible viral cardiomyopathy.  His symptoms are quite concerning, we will need aggressive workup and management.  - Arrange for CPX to assess functional capacity.  - cardiac MRI to assess for infiltrative disease (do before  ICD).  - He will likely eventually need RHC to assess filling pressures and cardiac output.  - Increase Lasix to 40 mg bid with BMET 10 days.  - Add spironolactone 12.5 daily.  - Continue current Coreg and Entresto.  - He has an appointment with Dr. Royann Shivers for ICD.  - I will refer him to cardiac rehab.  - Close followup, may need advanced therapies down the road.  2. OSA: Continue CPAP.   Followup with me in 2 wks.  Marca Ancona 03/29/2018

## 2018-03-30 ENCOUNTER — Encounter (HOSPITAL_COMMUNITY): Payer: 59

## 2018-03-30 ENCOUNTER — Ambulatory Visit (INDEPENDENT_AMBULATORY_CARE_PROVIDER_SITE_OTHER): Payer: 59 | Admitting: Cardiovascular Disease

## 2018-03-30 ENCOUNTER — Encounter: Payer: Self-pay | Admitting: Cardiovascular Disease

## 2018-03-30 VITALS — BP 132/84 | HR 88 | Ht 71.0 in | Wt 238.0 lb

## 2018-03-30 DIAGNOSIS — Z9189 Other specified personal risk factors, not elsewhere classified: Secondary | ICD-10-CM | POA: Diagnosis not present

## 2018-03-30 DIAGNOSIS — I5042 Chronic combined systolic (congestive) and diastolic (congestive) heart failure: Secondary | ICD-10-CM | POA: Diagnosis not present

## 2018-03-30 DIAGNOSIS — I428 Other cardiomyopathies: Secondary | ICD-10-CM | POA: Diagnosis not present

## 2018-03-30 DIAGNOSIS — Z01812 Encounter for preprocedural laboratory examination: Secondary | ICD-10-CM | POA: Diagnosis not present

## 2018-03-30 MED ORDER — SACUBITRIL-VALSARTAN 49-51 MG PO TABS: 1.0000 | ORAL_TABLET | Freq: Two times a day (BID) | ORAL | 5 refills | Status: DC

## 2018-03-30 NOTE — Patient Instructions (Addendum)
Dr Royann Shivers has recommended making the following medication changes: 1. INCREASE Entresto to 49-51 twice daily   [facility] at [facility]  [address]  [place], [place] [address]  Phone: [phone] Fax: [phone]   You are scheduled for: Implantable Cardioverter Defibrillator on Wednesday, August 21st, 2019 with Dr Royann Shivers.  Please arrive at the [facility] Entrance "A" of [facility] ([address] [place]) at 1:00 pm on the day of your procedure.  1. You may eat a light, early breakfast but nothing to eat after 8:00 am.  2. Complete labwork when you arrive to the hospital. You do not have to be fasting.  3. You may continue your current medications.  4. Plan for an overnight stay.  5. Bring your insurance cards and a list of your current medications.  6. Wash your chest and neck with the surgical scrub provided the evening before and the morning of your procedure.  Rinse well.  If you have ANY questions after you get home, please call the office [phone].   Thank you, Thurmon Fair, MD [place], CMA  * Special note:  Every effort is made to have your procedure done on time.  Occasionally there are emergencies that present themselves at the hospital that may cause delays.  Please be patient if a delay does occur.    Preparing for Surgery  Before surgery, you can play an important role. Because skin is not sterile, your skin needs to be as free of germs as possible. You can reduce the number of germs on your skin by washing with CHG (chlorhexidine gluconate) Soap before surgery. CHG is an antiseptic cleaner which kills germs and bonds with the skin to continue killing germs even after washing.  Please do not use if you have an allergy to CHG or antibacterial soaps. If your skin becomes reddened/irritated, STOP using the CHG.  DO NOT SHAVE (including legs and underarms) for at least 48 hours prior to first CHG  shower. It is OK to shave your face.  Please follow these instructions carefully: 1. Shower the night before surgery and the morning of surgery with CHG Soap. 2. If you chose to wash your hair, wash your hair first as usual with your normal shampoo/conditioner. 3. After you shampoo/condition, rinse you hair and body thoroughly to remove shampoo/conditioner. 4. Use CHG as you would any other liquid soap. You can apply CHG directly to the skin and wash gently with a loofah or a clean washcloth. 5. Apply the CHG Soap to your body ONLY FROM THE NECK DOWN. Do not use on open wounds or open sores. Avoid contact with your eyes, ears, mouth, and genitals (private parts). Wash genitals (private part) with your normal soap. 6. Wash thoroughly, paying special attention to the area where your surgery will be performed. 7. Thoroughly rinse your body with warm water from the neck down. 8. DO NOT shower/wash with your normal soap after using and rinsing off the CHG Soap. 9. Pat yourself dry with a clean towel. 10. Wear clean pajamas to bed. 11. Place clean sheets on your bed the night of your first shower and do not sleep with pets.  Day of Surgery: Shower with the CHG Soap following the instructions listed above. DO NOT apply deodorants or lotions. Please wear clean clothes to the hospital/surgery center.

## 2018-03-31 ENCOUNTER — Ambulatory Visit (HOSPITAL_COMMUNITY): Payer: 59 | Attending: Cardiology

## 2018-03-31 NOTE — Progress Notes (Signed)
Cardiology Office Note:    Date:  03/31/2018   ID:  John [name], DOB January 02, 1961, MRN [id number]  PCP:  Margaree Mackintosh, MD  Cardiologist:  Darrel Reach (CHF)  Referring MD: Margaree Mackintosh, MD   Chief Complaint  Patient presents with  . Follow-up  . Dizziness    In the morning at times.  . Shortness of Breath  Discuss ICD  History of Present Illness:    John [name] is a 57 y.o. male with a hx of severe nonischemic cardiomyopathy with left ventricular ejection fraction most recently estimated to be 15-20%.  Overall there is been some variation in estimation of LV systolic function, EF has been severely depressed at least since February 2018.  He also has a severely dilated and dysfunctional right ventricle.    He is on comprehensive medical therapy with Entresto high-dose carvedilol and as well compensated volume status on low-dose of loop diuretic, spironolactone.  He works full-time and has NYHA functional class II-III exertional dyspnea.  He is rather sedentary.  He does become short of breath declines more than 1 flight of stairs.  He rarely engages in intense physical activity but shortness of breath does not interfere with his usual activity.  He has not required any adjustment in his diuretic dose recently.  He is scheduled for a cardiopulmonary exercise stress test in the near future. REDS vest reading 38% at his appointment in the heart failure clinic just a few days ago.  He does not have a history of palpitations, dizziness, syncope and does not have angina pectoris.  He denies edema, claudication, focal neurological complaints.  He uses CPAP for obstructive sleep apnea and denies daytime hypersomnolence.  Reports good compliance with the therapy.  He has never had atrial fibrillation.  He does not have problems with bradycardia despite high-dose carvedilol.  Past Medical History:  Diagnosis Date  . Anxiety    Ativan  . Chronic combined systolic and diastolic CHF,  NYHA class 3 (HCC) 10/2016   Nonischemic cardiomyopathy. EF 20-25%.  . Chronic left hip pain   . Depression    history of  . GI bleed 03/2016  . Headache   . Hypertensive heart disease with combined systolic and diastolic congestive heart failure (HCC) 10/2016  . Nonischemic cardiomyopathy (HCC) 10/2016   Echo with EF 20-25%. Cardiac catheterization with no CAD. LVEDP was 41 mmHg, PCWP 36 mmHg  . Osteoarthritis    right shoulder  . Right hand fracture   . Seasonal allergies   . Sleep apnea    wears a CPAP  . Wears glasses     Past Surgical History:  Procedure Laterality Date  . CARDIAC CATHETERIZATION    . ESOPHAGOGASTRODUODENOSCOPY (EGD) WITH PROPOFOL N/A 03/20/2016   Procedure: ESOPHAGOGASTRODUODENOSCOPY (EGD) WITH PROPOFOL;  Surgeon: Charlott Rakes, MD;  Location: [facility] ENDOSCOPY;  Service: Endoscopy;  Laterality: N/A;  . RIGHT/LEFT HEART CATH AND CORONARY ANGIOGRAPHY N/A 10/21/2016   Procedure: Right/Left Heart Cath and Coronary Angiography;  Surgeon: Runell Gess, MD;  Location: [facility] INVASIVE CV LAB;  Service: Cardiovascular: Angiographically normal coronary arteries. PCWP 33-36 mmHg, LVEDP 41 mmHg. PA pressure 60/35, mean 46 mmHg.  Cardiac output/cardiac index-3.93 /1.76 (Fick), 3.49/1.57 (thermodilution)  . TOTAL HIP ARTHROPLASTY Left 03/27/2015   Procedure: LEFT TOTAL HIP ARTHROPLASTY ANTERIOR APPROACH;  Surgeon: Samson Frederic, MD;  Location: [facility] OR;  Service: Orthopedics;  Laterality: Left;  . TOTAL SHOULDER ARTHROPLASTY Right 11/24/2015   Procedure: RIGHT TOTAL SHOULDER ARTHROPLASTY;  Surgeon: Beverely Low, MD;  Location: [facility] OR;  Service: Orthopedics;  Laterality: Right;  . TRANSTHORACIC ECHOCARDIOGRAM  10/2016   EF 20-25%. Diffuse hypokinesis but akinesis of the entire inferoseptal wall and apical wall. Moderate biatrial enlargement. PA pressure estimated 64 mmHg.  . WISDOM TOOTH EXTRACTION      Current Medications: Current Meds  Medication Sig  . aspirin 81 MG chewable  tablet Chew 1 tablet (81 mg total) by mouth daily.  Marland Kitchen buPROPion (WELLBUTRIN XL) 150 MG 24 hr tablet Take 1 tablet by mouth daily.  . carvedilol (COREG) 25 MG tablet Take 1 tablet (25 mg total) by mouth 2 (two) times daily with a meal.  . furosemide (LASIX) 40 MG tablet Take 1 tablet (40 mg total) by mouth 2 (two) times daily.  Marland Kitchen KLOR-CON M20 20 MEQ tablet TAKE 1 TABLET (20 MEQ TOTAL) BY MOUTH DAILY.  . Multiple Vitamins-Minerals (MULTIVITAMIN WITH MINERALS) tablet Take 1 tablet by mouth daily.  Marland Kitchen spironolactone (ALDACTONE) 25 MG tablet Take 0.5 tablets (12.5 mg total) by mouth daily.  . [DISCONTINUED] sacubitril-valsartan (ENTRESTO) 24-26 MG Take 1 tablet by mouth 2 (two) times daily.     Allergies:   Patient has no known allergies.   Social History   Socioeconomic History  . Marital status: Divorced    Spouse name: Not on file  . Number of children: Not on file  . Years of education: Not on file  . Highest education level: Not on file  Occupational History  . Not on file  Social Needs  . Financial resource strain: Not on file  . Food insecurity:    Worry: Not on file    Inability: Not on file  . Transportation needs:    Medical: Not on file    Non-medical: Not on file  Tobacco Use  . Smoking status: Never Smoker  . Smokeless tobacco: Never Used  Substance and Sexual Activity  . Alcohol use: Yes    Alcohol/week: 1.8 oz    Types: 3 Cans of beer per week    Comment: occasional  . Drug use: No  . Sexual activity: Not on file  Lifestyle  . Physical activity:    Days per week: Not on file    Minutes per session: Not on file  . Stress: Not on file  Relationships  . Social connections:    Talks on phone: Not on file    Gets together: Not on file    Attends religious service: Not on file    Active member of club or organization: Not on file    Attends meetings of clubs or organizations: Not on file    Relationship status: Not on file  Other Topics Concern  . Not on file    Social History Narrative  . Not on file     Family History: The patient's family history includes Congenital heart disease in his sister.  His son also had a "hole in his heart" there was repaired surgically.  ROS:   Please see the history of present illness.     All other systems reviewed and are negative.  EKGs/Labs/Other Studies Reviewed:    The following studies were reviewed today: Notes from Dr. Gery Pray and Dr. Shirlee Latch  EKG:  EKG is not ordered today.  The ekg ordered March 06, 2018 shows sinus rhythm, nonspecific minor IVCD with QRS duration 102 ms, lateral nonspecific ST-T changes QTC 462 ms  Recent Labs: 06/29/2017: ALT 45; Hemoglobin 10.1; Platelets 440 03/16/2018: BUN 11; Creat 0.91; Potassium 4.9; Sodium  136  Recent Lipid Panel No results found for: CHOL, TRIG, HDL, CHOLHDL, VLDL, LDLCALC, LDLDIRECT  Physical Exam:    VS:  BP 132/84 (BP Location: Left Arm, Patient Position: Sitting, Cuff Size: Large)   Pulse 88   Ht 5\' 11"  (1.803 m)   Wt 238 lb (108 kg)   BMI 33.19 kg/m     Wt Readings from Last 3 Encounters:  03/30/18 238 lb (108 kg)  03/27/18 240 lb 12 oz (109.2 kg)  03/06/18 250 lb (113.4 kg)     GEN: Mildly obese, appears comfortable and cheerful, well nourished, well developed in no acute distress HEENT: Normal NECK: No JVD; No carotid bruits LYMPHATICS: No lymphadenopathy CARDIAC: Inferolateral displacement apical impulse, RRR, no murmurs, rubs, gallops RESPIRATORY:  Clear to auscultation without rales, wheezing or rhonchi  ABDOMEN: Soft, non-tender, non-distended MUSCULOSKELETAL:  No edema; No deformity  SKIN: Warm and dry NEUROLOGIC:  Alert and oriented x 3 PSYCHIATRIC:  Normal affect   ASSESSMENT:    1. Chronic combined systolic and diastolic heart failure (HCC)   2. Non-ischemic cardiomyopathy (HCC)   3. At risk for sudden cardiac death   4. Pre-procedure lab exam    PLAN:    In order of problems listed above:  1. CHF: Appears to have  NYHA functional status class II-III, but this may be an overestimation since he avoids engaging in terms physical activity.  Either way he appears to be euvolemic on the current medication regimen.  His blood pressure appears to allow further increase and Entresto and will start the 49-51 mg dose.  He has a follow-up appointment with Dr. Shirlee Latch on July 30.  Additional therapeutic options include further increase in the dose of Entresto and/or spironolactone as allowed by blood pressure, renal function and potassium.  Can also consider Corlanor since his heart rate remains rather fast on full dose carvedilol. 2. At risk for sudden cardiac death:  meets criteria for primary prevention ICD implantation for non ischemic cardiomyopathy (left ventricular ejection fraction under 35%, heart failure NYHA class II-III, on comprehensive medical therapy).  Discussed ICD implantation and follow-up in detail, possible complications and drawbacks.  This procedure has been fully reviewed with the patient and informed consent has been obtained.  QRS is narrow and does not justify CRT.  He should have device implantation after he undergoes his cardiac MRI.   Medication Adjustments/Labs and Tests Ordered: Current medicines are reviewed at length with the patient today.  Concerns regarding medicines are outlined above.  Orders Placed This Encounter  Procedures  . CBC  . Protime-INR   Meds ordered this encounter  Medications  . sacubitril-valsartan (ENTRESTO) 49-51 MG    Sig: Take 1 tablet by mouth 2 (two) times daily.    Dispense:  60 tablet    Refill:  5    Patient Instructions  Dr Royann Shivers has recommended making the following medication changes: 1. INCREASE Entresto to 49-51 twice daily   [facility] at [facility]  [address], [address]  [place], [place] [address]  Phone: [phone] Fax: [phone]   You are scheduled for: Implantable Cardioverter Defibrillator on Wednesday,  August 21st, 2019 with Dr Royann Shivers.  Please arrive at the [facility] Entrance "A" of [facility] ([address] [place]) at 1:00 pm on the day of your procedure.  1. You may eat a light, early breakfast but nothing to eat after 8:00 am.  2. Complete labwork when you arrive to the hospital.  You do not have to be fasting.  3. You may continue your current medications.  4. Plan for an overnight stay.  5. Bring your insurance cards and a list of your current medications.  6. Wash your chest and neck with the surgical scrub provided the evening before and the morning of your procedure.  Rinse well.  If you have ANY questions after you get home, please call the office [phone].   Thank you, Thurmon Fair, MD [place], CMA  * Special note:  Every effort is made to have your procedure done on time.  Occasionally there are emergencies that present themselves at the hospital that may cause delays.  Please be patient if a delay does occur.    Preparing for Surgery  Before surgery, you can play an important role. Because skin is not sterile, your skin needs to be as free of germs as possible. You can reduce the number of germs on your skin by washing with CHG (chlorhexidine gluconate) Soap before surgery. CHG is an antiseptic cleaner which kills germs and bonds with the skin to continue killing germs even after washing.  Please do not use if you have an allergy to CHG or antibacterial soaps. If your skin becomes reddened/irritated, STOP using the CHG.  DO NOT SHAVE (including legs and underarms) for at least 48 hours prior to first CHG shower. It is OK to shave your face.  Please follow these instructions carefully: 1. Shower the night before surgery and the morning of surgery with CHG Soap. 2. If you chose to wash your hair, wash your hair first as usual with your normal shampoo/conditioner. 3. After you shampoo/condition, rinse you hair and body thoroughly to remove  shampoo/conditioner. 4. Use CHG as you would any other liquid soap. You can apply CHG directly to the skin and wash gently with a loofah or a clean washcloth. 5. Apply the CHG Soap to your body ONLY FROM THE NECK DOWN. Do not use on open wounds or open sores. Avoid contact with your eyes, ears, mouth, and genitals (private parts). Wash genitals (private part) with your normal soap. 6. Wash thoroughly, paying special attention to the area where your surgery will be performed. 7. Thoroughly rinse your body with warm water from the neck down. 8. DO NOT shower/wash with your normal soap after using and rinsing off the CHG Soap. 9. Pat yourself dry with a clean towel. 10. Wear clean pajamas to bed. 11. Place clean sheets on your bed the night of your first shower and do not sleep with pets.  Day of Surgery: Shower with the CHG Soap following the instructions listed above. DO NOT apply deodorants or lotions. Please wear clean clothes to the hospital/surgery center.     Signed, Thurmon Fair, MD  03/31/2018 9:30 AM    [facility]

## 2018-04-01 ENCOUNTER — Encounter (HOSPITAL_COMMUNITY): Payer: 59

## 2018-04-01 ENCOUNTER — Other Ambulatory Visit (HOSPITAL_COMMUNITY): Payer: Self-pay | Admitting: *Deleted

## 2018-04-01 ENCOUNTER — Telehealth (HOSPITAL_COMMUNITY): Payer: Self-pay

## 2018-04-01 DIAGNOSIS — I429 Cardiomyopathy, unspecified: Secondary | ICD-10-CM | POA: Insufficient documentation

## 2018-04-01 DIAGNOSIS — I5021 Acute systolic (congestive) heart failure: Secondary | ICD-10-CM

## 2018-04-01 DIAGNOSIS — Z7982 Long term (current) use of aspirin: Secondary | ICD-10-CM | POA: Insufficient documentation

## 2018-04-01 DIAGNOSIS — Z79899 Other long term (current) drug therapy: Secondary | ICD-10-CM | POA: Insufficient documentation

## 2018-04-01 DIAGNOSIS — F419 Anxiety disorder, unspecified: Secondary | ICD-10-CM | POA: Insufficient documentation

## 2018-04-01 DIAGNOSIS — I110 Hypertensive heart disease with heart failure: Secondary | ICD-10-CM | POA: Insufficient documentation

## 2018-04-01 DIAGNOSIS — I5042 Chronic combined systolic (congestive) and diastolic (congestive) heart failure: Secondary | ICD-10-CM | POA: Insufficient documentation

## 2018-04-01 NOTE — Telephone Encounter (Signed)
Referral received. Patient has scheduled procedure (ICD) on 05/06/18. Will hold off on scheduling patient until procedure and follow up appt has been completed. Insurance benefits and eligibility to be determined.

## 2018-04-03 ENCOUNTER — Other Ambulatory Visit (HOSPITAL_COMMUNITY): Payer: Self-pay

## 2018-04-03 ENCOUNTER — Ambulatory Visit (HOSPITAL_COMMUNITY)
Admission: RE | Admit: 2018-04-03 | Discharge: 2018-04-03 | Disposition: A | Discharge status: Home / Self Care | Payer: 59 | Source: Ambulatory Visit | Attending: Cardiology | Admitting: Cardiology

## 2018-04-03 ENCOUNTER — Encounter (HOSPITAL_COMMUNITY): Payer: 59

## 2018-04-03 ENCOUNTER — Telehealth (HOSPITAL_COMMUNITY): Payer: Self-pay | Admitting: *Deleted

## 2018-04-03 DIAGNOSIS — I5021 Acute systolic (congestive) heart failure: Secondary | ICD-10-CM

## 2018-04-03 LAB — BASIC METABOLIC PANEL
Anion gap: 9 (ref 5–15)
BUN: 12 mg/dL (ref 6–20)
CHLORIDE: 104 mmol/L (ref 98–111)
CO2: 24 mmol/L (ref 22–32)
CREATININE: 0.9 mg/dL (ref 0.61–1.24)
Calcium: 9 mg/dL (ref 8.9–10.3)
GFR calc Af Amer: >60 mL/min (ref >60)
GFR calc non Af Amer: >60 mL/min (ref >60)
Glucose, Bld: 104 mg/dL — ABNORMAL HIGH (ref 70–99)
POTASSIUM: 4.4 mmol/L (ref 3.5–5.1)
SODIUM: 137 mmol/L (ref 135–145)

## 2018-04-03 NOTE — Telephone Encounter (Signed)
Result Notes for Cardiopulmonary exercise test   Notes recorded by Georgina Peer, RN on 04/03/2018 at 9:33 AM EDT Called and spoke with patient, he is aware and no further questions. ------  Notes recorded by Laurey Morale, MD on 04/01/2018 at 11:24 AM EDT Probably moderate to severe functional limitation but submaximal.

## 2018-04-06 ENCOUNTER — Encounter: Payer: Self-pay | Admitting: Cardiology

## 2018-04-06 ENCOUNTER — Encounter (HOSPITAL_COMMUNITY): Payer: 59

## 2018-04-06 ENCOUNTER — Telehealth: Payer: Self-pay | Admitting: Cardiology

## 2018-04-06 NOTE — Telephone Encounter (Signed)
I had called the patient on 04-03-18 and left a detailed message as to what time of day or days would be good to schedule his cardiac MRI.  He called me back 4 times and none of these times he could tell me when it would be good to schedule his MRI.  I sent a staff message to the technician to call him as it would be easier as she would have the schedule there to see when he could be scheduled.

## 2018-04-08 ENCOUNTER — Encounter (HOSPITAL_COMMUNITY): Payer: 59

## 2018-04-08 ENCOUNTER — Ambulatory Visit: Payer: Self-pay | Admitting: Cardiology

## 2018-04-08 ENCOUNTER — Telehealth (HOSPITAL_COMMUNITY): Payer: Self-pay

## 2018-04-08 NOTE — Telephone Encounter (Signed)
Pt insurance is active and benefits verified through [place]. Co-pay $0.00, DED $700.00/$700.00 met, out of pocket $3,500.00/$3,500.00 met, co-insurance 20%. No pre-authorization required. Ashley/[employer], 04/08/18 @ 1:49pm, REF# [id number]  Will contact patient to see if he is interested in the Cardiac Rehab Program. If interested, patient will need to complete follow up appt. Once completed, patient will be contacted for scheduling upon review by the RN Navigator.

## 2018-04-10 ENCOUNTER — Encounter (HOSPITAL_COMMUNITY): Payer: 59

## 2018-04-13 ENCOUNTER — Encounter (HOSPITAL_COMMUNITY): Payer: 59

## 2018-04-14 ENCOUNTER — Encounter (HOSPITAL_COMMUNITY): Payer: Self-pay | Admitting: Cardiology

## 2018-04-14 ENCOUNTER — Encounter (HOSPITAL_COMMUNITY)
Admission: RE | Admit: 2018-04-14 | Discharge: 2018-04-14 | Disposition: A | Discharge status: Home / Self Care | Payer: 59 | Source: Ambulatory Visit | Attending: Cardiology | Admitting: Cardiology

## 2018-04-14 ENCOUNTER — Ambulatory Visit (HOSPITAL_COMMUNITY)
Admission: RE | Admit: 2018-04-14 | Discharge: 2018-04-14 | Disposition: A | Discharge status: Home / Self Care | Payer: 59 | Source: Ambulatory Visit | Attending: Cardiology | Admitting: Cardiology

## 2018-04-14 VITALS — BP 132/82 | HR 74 | Wt 235.2 lb

## 2018-04-14 DIAGNOSIS — I429 Cardiomyopathy, unspecified: Secondary | ICD-10-CM | POA: Insufficient documentation

## 2018-04-14 DIAGNOSIS — I5082 Biventricular heart failure: Secondary | ICD-10-CM | POA: Diagnosis not present

## 2018-04-14 DIAGNOSIS — Z791 Long term (current) use of non-steroidal anti-inflammatories (NSAID): Secondary | ICD-10-CM | POA: Insufficient documentation

## 2018-04-14 DIAGNOSIS — Z09 Encounter for follow-up examination after completed treatment for conditions other than malignant neoplasm: Secondary | ICD-10-CM | POA: Diagnosis not present

## 2018-04-14 DIAGNOSIS — N529 Male erectile dysfunction, unspecified: Secondary | ICD-10-CM | POA: Diagnosis not present

## 2018-04-14 DIAGNOSIS — I428 Other cardiomyopathies: Secondary | ICD-10-CM

## 2018-04-14 DIAGNOSIS — G4733 Obstructive sleep apnea (adult) (pediatric): Secondary | ICD-10-CM | POA: Diagnosis not present

## 2018-04-14 DIAGNOSIS — Z79899 Other long term (current) drug therapy: Secondary | ICD-10-CM | POA: Insufficient documentation

## 2018-04-14 DIAGNOSIS — I5042 Chronic combined systolic (congestive) and diastolic (congestive) heart failure: Secondary | ICD-10-CM

## 2018-04-14 DIAGNOSIS — F329 Major depressive disorder, single episode, unspecified: Secondary | ICD-10-CM | POA: Diagnosis not present

## 2018-04-14 DIAGNOSIS — Z7982 Long term (current) use of aspirin: Secondary | ICD-10-CM | POA: Insufficient documentation

## 2018-04-14 DIAGNOSIS — F411 Generalized anxiety disorder: Secondary | ICD-10-CM | POA: Diagnosis not present

## 2018-04-14 DIAGNOSIS — I5021 Acute systolic (congestive) heart failure: Secondary | ICD-10-CM

## 2018-04-14 DIAGNOSIS — I5022 Chronic systolic (congestive) heart failure: Secondary | ICD-10-CM | POA: Insufficient documentation

## 2018-04-14 LAB — BASIC METABOLIC PANEL
Anion gap: 10 (ref 5–15)
BUN: 10 mg/dL (ref 6–20)
CALCIUM: 9.5 mg/dL (ref 8.9–10.3)
CO2: 26 mmol/L (ref 22–32)
CREATININE: 0.83 mg/dL (ref 0.61–1.24)
Chloride: 97 mmol/L — ABNORMAL LOW (ref 98–111)
Glucose, Bld: 119 mg/dL — ABNORMAL HIGH (ref 70–99)
Potassium: 4.3 mmol/L (ref 3.5–5.1)
SODIUM: 133 mmol/L — AB (ref 135–145)

## 2018-04-14 MED ORDER — GADOBENATE DIMEGLUMINE 529 MG/ML IV SOLN
35.0000 mL | Freq: Once | INTRAVENOUS | Status: AC | PRN
  Administered 2018-04-14: 35 mL via INTRAVENOUS

## 2018-04-14 MED ORDER — SPIRONOLACTONE 25 MG PO TABS: 25.0000 mg | ORAL_TABLET | Freq: Every day | ORAL | 3 refills | Status: DC

## 2018-04-14 MED ORDER — SERTRALINE HCL 25 MG PO TABS: 25.0000 mg | ORAL_TABLET | Freq: Every day | ORAL | 11 refills | Status: DC

## 2018-04-14 MED ORDER — SILDENAFIL CITRATE 100 MG PO TABS: 50.0000 mg | ORAL_TABLET | Freq: Every day | ORAL | 0 refills | Status: DC | PRN

## 2018-04-14 NOTE — H&P (View-Only) (Signed)
PCP: Dr. Nolen Mu Cardiology: Dr. Allyson Sabal HF Cardiology: Dr. Shirlee Latch  57 y.o. with history of nonischemic cardiomyopathy and OSA was referred by Dr. Allyson Sabal for evaluation of CHF.  He was initially diagnosed in early 2/18.  He had URI symptoms then developed dyspnea and put on weight. Echo was done in 2/18, showing EF 20-25%.  LHC in 2/18 showed normal coronaries. He was started on treatment for cardiomyopathy, and EF rose to 40% by 6/18. However, most recent echo in 6/19 showed EF back down to 15-20% with severe RV dilation/dysfunction.  CPX was done in 7/19, showed moderate functional limitation though submaximal.   Patient returns for followup of CHF.  At last appointment, I adjusted his medications.  He says that he is mostly in the "green zone" now.  This seems like an improvement compared to prior. Weight is down 5 lbs.  No dyspnea walking on flat ground.  Still short of breath with moderate to heavy exertion.  He has significant anxiety and takes lorazepam as needed.  No chest pain.  Uses CPAP at night, no orthopnea/PND.  No lightheadedness, tolerating meds.    Labs (7/19): K 4.9, creatinine 0.91  PMH: 1. OSA: uses CPAP.  2. Depression 3. Chronic systolic CHF: Nonischemic cardiomyopathy.  Diagnosed around 2/18.  - LHC (2/18): Normal coronaries.  - Echo (2/18): EF 20-25% - Echo (6/18): EF 40% - Echo (12/18): EF 35-40% - Echo (6/19): LV mildly dilated, EF 15-20%, RV severely dilated with severely decreased systolic function.  - CPX (7/19): peak VO2 16, VE/VCO2 slope 37, RER 1.03 => submaximal but probably moderate functional limitation.   SH: Originally from [place], divorced with 3 kids, works in sales/logistics, never smoked, 3 drinks/week. No drugs. Lives in [place].   FH: Sister with congenital heart abnormality.  No other cardiac disease.   ROS: All systems reviewed and negative except as per HPI.  Current Outpatient Medications  Medication Sig Dispense Refill  . aspirin 81 MG  chewable tablet Chew 1 tablet (81 mg total) by mouth daily.    . carvedilol (COREG) 25 MG tablet Take 1 tablet (25 mg total) by mouth 2 (two) times daily with a meal. 60 tablet 6  . furosemide (LASIX) 40 MG tablet Take 1 tablet (40 mg total) by mouth 2 (two) times daily. 180 tablet 1  . KLOR-CON M20 20 MEQ tablet TAKE 1 TABLET (20 MEQ TOTAL) BY MOUTH DAILY. 90 tablet 1  . meloxicam (MOBIC) 15 MG tablet Take 15 mg by mouth daily.    . Multiple Vitamins-Minerals (MULTIVITAMIN WITH MINERALS) tablet Take 1 tablet by mouth daily.    . rosuvastatin (CRESTOR) 20 MG tablet Take 20 mg by mouth daily.    . sacubitril-valsartan (ENTRESTO) 49-51 MG Take 1 tablet by mouth 2 (two) times daily. 60 tablet 5  . spironolactone (ALDACTONE) 25 MG tablet Take 1 tablet (25 mg total) by mouth daily. 90 tablet 3  . sertraline (ZOLOFT) 25 MG tablet Take 1 tablet (25 mg total) by mouth daily. 30 tablet 11  . sildenafil (VIAGRA) 100 MG tablet Take 0.5 tablets (50 mg total) by mouth daily as needed for erectile dysfunction. 10 tablet 0   No current facility-administered medications for this encounter.    BP 132/82   Pulse 74   Wt 235 lb 3.2 oz (106.7 kg)   SpO2 98%   BMI 32.80 kg/m  General: NAD Neck: Thick, no JVD, no thyromegaly or thyroid nodule.  Lungs: Clear to auscultation bilaterally with normal respiratory  effort. CV: Nondisplaced PMI.  Heart regular S1/S2, no S3/S4, no murmur.  No peripheral edema.  No carotid bruit.  Normal pedal pulses.  Abdomen: Soft, nontender, no hepatosplenomegaly, no distention.  Skin: Intact without lesions or rashes.  Neurologic: Alert and oriented x 3.  Psych: Normal affect. Extremities: No clubbing or cyanosis.  HEENT: Normal.   Assessment/Plan: 1. Chronic systolic CHF: Nonischemic cardiomyopathy with biventricular failure.  Cath in 2/18 with no coronary disease.  Echo (6/19) with Ef 15-20%, severe RV dilation/dysfunction.  CPX 7/19 submaximal but probably moderate functional  limitation.  Cause of CMP is uncertain, never a very heavy drinker and no drugs.  No family history.  Possible viral cardiomyopathy. Symptoms improved, NYHA class II. Not volume overloaded on exam.  - ICD planned in 8/19.  - cardiac MRI to assess for infiltrative disease (later today).  - Continue Lasix 40 mg bid.  - Increase spironolactone to 25 mg daily, BMET today and again in 10 days.  - Continue current Coreg and Entresto.  - Referred to cardiac rehab.  - Close followup, may need advanced therapies down the road.  2. OSA: Continue CPAP.  3. Anxiety: Generalized.  Would rather that he not take lorazepam regularly.  - Start sertraline 25 mg daily for generalized anxiety.  4. Erectile dysfunction: He can try low dose Viagra.   Followup in HF pharmacy clinic in 2-3 weeks (increase Entresto), see me in 6 wks.   John [name] 04/14/2018

## 2018-04-14 NOTE — Patient Instructions (Signed)
INCREASE Spironolactone to 25 mg (1 whole tablet) once daily.  START Sertraline (Zoloft) 25 mg tablet once daily.  STOP Ativan (Lorazepam).  Routine lab work today. Will notify you of abnormal results, otherwise no news is good news!  Resume cardiac rehab. Their office will call to schedule.  Follow up 2 weeks with pharmacy and lab work.  ______________________________________________________________ [place] Code: 1500  Follow up 6 weeks with Dr. Shirlee Latch.  ______________________________________________________________ [place] Code: 1600  Take all medication as prescribed the day of your appointment. Bring all medications with you to your appointment.  Do the following things EVERYDAY: 1) Weigh yourself in the morning before breakfast. Write it down and keep it in a log. 2) Take your medicines as prescribed 3) Eat low salt foods-Limit salt (sodium) to 2000 mg per day.  4) Stay as active as you can everyday 5) Limit all fluids for the day to less than 2 liters

## 2018-04-14 NOTE — Progress Notes (Signed)
PCP: Dr. McKinney Cardiology: Dr. Berry HF Cardiology: Dr. Undray Allman  57 y.o. with history of nonischemic cardiomyopathy and OSA was referred by Dr. Berry for evaluation of CHF.  He was initially diagnosed in early 2/18.  He had URI symptoms then developed dyspnea and put on weight. Echo was done in 2/18, showing EF 20-25%.  LHC in 2/18 showed normal coronaries. He was started on treatment for cardiomyopathy, and EF rose to 40% by 6/18. However, most recent echo in 6/19 showed EF back down to 15-20% with severe RV dilation/dysfunction.  CPX was done in 7/19, showed moderate functional limitation though submaximal.   Patient returns for followup of CHF.  At last appointment, I adjusted his medications.  He says that he is mostly in the "green zone" now.  This seems like an improvement compared to prior. Weight is down 5 lbs.  No dyspnea walking on flat ground.  Still short of breath with moderate to heavy exertion.  He has significant anxiety and takes lorazepam as needed.  No chest pain.  Uses CPAP at night, no orthopnea/PND.  No lightheadedness, tolerating meds.    Labs (7/19): K 4.9, creatinine 0.91  PMH: 1. OSA: uses CPAP.  2. Depression 3. Chronic systolic CHF: Nonischemic cardiomyopathy.  Diagnosed around 2/18.  - LHC (2/18): Normal coronaries.  - Echo (2/18): EF 20-25% - Echo (6/18): EF 40% - Echo (12/18): EF 35-40% - Echo (6/19): LV mildly dilated, EF 15-20%, RV severely dilated with severely decreased systolic function.  - CPX (7/19): peak VO2 16, VE/VCO2 slope 37, RER 1.03 => submaximal but probably moderate functional limitation.   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 No drugs. Lives in [place].   FH: Sister with congenital heart abnormality.  No other cardiac disease.   ROS: All systems reviewed and negative except as per HPI.  Current Outpatient Medications  Medication Sig Dispense Refill  . aspirin 81 MG  chewable tablet Chew 1 tablet (81 mg total) by mouth daily.    . carvedilol (COREG) 25 MG tablet Take 1 tablet (25 mg total) by mouth 2 (two) times daily with a meal. 60 tablet 6  . furosemide (LASIX) 40 MG tablet Take 1 tablet (40 mg total) by mouth 2 (two) times daily. 180 tablet 1  . KLOR-CON M20 20 MEQ tablet TAKE 1 TABLET (20 MEQ TOTAL) BY MOUTH DAILY. 90 tablet 1  . meloxicam (MOBIC) 15 MG tablet Take 15 mg by mouth daily.    . Multiple Vitamins-Minerals (MULTIVITAMIN WITH MINERALS) tablet Take 1 tablet by mouth daily.    . rosuvastatin (CRESTOR) 20 MG tablet Take 20 mg by mouth daily.    . sacubitril-valsartan (ENTRESTO) 49-51 MG Take 1 tablet by mouth 2 (two) times daily. 60 tablet 5  . spironolactone (ALDACTONE) 25 MG tablet Take 1 tablet (25 mg total) by mouth daily. 90 tablet 3  . sertraline (ZOLOFT) 25 MG tablet Take 1 tablet (25 mg total) by mouth daily. 30 tablet 11  . sildenafil (VIAGRA) 100 MG tablet Take 0.5 tablets (50 mg total) by mouth daily as needed for erectile dysfunction. 10 tablet 0   No current facility-administered medications for this encounter.    BP 132/82   Pulse 74   Wt 235 lb 3.2 oz (106.7 kg)   SpO2 98%   BMI 32.80 kg/m  General: NAD Neck: Thick, no JVD, no thyromegaly or thyroid nodule.  Lungs: Clear to auscultation bilaterally with normal respiratory   effort. CV: Nondisplaced PMI.  Heart regular S1/S2, no S3/S4, no murmur.  No peripheral edema.  No carotid bruit.  Normal pedal pulses.  Abdomen: Soft, nontender, no hepatosplenomegaly, no distention.  Skin: Intact without lesions or rashes.  Neurologic: Alert and oriented x 3.  Psych: Normal affect. Extremities: No clubbing or cyanosis.  HEENT: Normal.   Assessment/Plan: 1. Chronic systolic CHF: Nonischemic cardiomyopathy with biventricular failure.  Cath in 2/18 with no coronary disease.  Echo (6/19) with Ef 15-20%, severe RV dilation/dysfunction.  CPX 7/19 submaximal but probably moderate functional  limitation.  Cause of CMP is uncertain, never a very heavy drinker and no drugs.  No family history.  Possible viral cardiomyopathy. Symptoms improved, NYHA class II. Not volume overloaded on exam.  - ICD planned in 8/19.  - cardiac MRI to assess for infiltrative disease (later today).  - Continue Lasix 40 mg bid.  - Increase spironolactone to 25 mg daily, BMET today and again in 10 days.  - Continue current Coreg and Entresto.  - Referred to cardiac rehab.  - Close followup, may need advanced therapies down the road.  2. OSA: Continue CPAP.  3. Anxiety: Generalized.  Would rather that he not take lorazepam regularly.  - Start sertraline 25 mg daily for generalized anxiety.  4. Erectile dysfunction: He can try low dose Viagra.   Followup in HF pharmacy clinic in 2-3 weeks (increase Entresto), see me in 6 wks.   Lanah Steines 04/14/2018    

## 2018-04-15 ENCOUNTER — Encounter (HOSPITAL_COMMUNITY): Payer: 59

## 2018-04-17 ENCOUNTER — Encounter (HOSPITAL_COMMUNITY): Payer: 59

## 2018-04-20 ENCOUNTER — Encounter (HOSPITAL_COMMUNITY): Payer: 59

## 2018-04-21 ENCOUNTER — Telehealth (HOSPITAL_COMMUNITY): Payer: Self-pay | Admitting: *Deleted

## 2018-04-21 NOTE — Telephone Encounter (Signed)
Result Notes for MR CARDIAC MORPHOLOGY W WO CONTRAST   Notes recorded by Georgina Peer, RN on 04/21/2018 at 10:18 AM EDT Called and spoke with patient, he's aware of results and no further questions. ------  Notes recorded by Laurey Morale, MD on 04/14/2018 at 9:39 PM EDT EF 29%, severely decreased LV systolic function (consistent with echo). Gadolinium enhancement images suggest that he may have had viral myocarditis as the cause of his cardiomyopathy.

## 2018-04-22 ENCOUNTER — Encounter (HOSPITAL_COMMUNITY): Payer: 59

## 2018-04-24 ENCOUNTER — Encounter (HOSPITAL_COMMUNITY): Payer: 59

## 2018-04-27 ENCOUNTER — Encounter (HOSPITAL_COMMUNITY): Payer: 59

## 2018-04-29 ENCOUNTER — Encounter (HOSPITAL_COMMUNITY): Payer: 59

## 2018-04-29 ENCOUNTER — Ambulatory Visit (HOSPITAL_COMMUNITY)
Admission: RE | Admit: 2018-04-29 | Discharge: 2018-04-29 | Disposition: A | Discharge status: Home / Self Care | Payer: 59 | Source: Ambulatory Visit | Attending: Cardiology | Admitting: Cardiology

## 2018-04-29 ENCOUNTER — Encounter (HOSPITAL_COMMUNITY): Payer: Self-pay

## 2018-04-29 VITALS — BP 120/90 | HR 62 | Wt 233.6 lb

## 2018-04-29 DIAGNOSIS — I5022 Chronic systolic (congestive) heart failure: Secondary | ICD-10-CM | POA: Insufficient documentation

## 2018-04-29 DIAGNOSIS — I428 Other cardiomyopathies: Secondary | ICD-10-CM

## 2018-04-29 DIAGNOSIS — G4733 Obstructive sleep apnea (adult) (pediatric): Secondary | ICD-10-CM | POA: Diagnosis not present

## 2018-04-29 DIAGNOSIS — N529 Male erectile dysfunction, unspecified: Secondary | ICD-10-CM | POA: Insufficient documentation

## 2018-04-29 DIAGNOSIS — F411 Generalized anxiety disorder: Secondary | ICD-10-CM | POA: Insufficient documentation

## 2018-04-29 LAB — BASIC METABOLIC PANEL
Anion gap: 12 (ref 5–15)
BUN: 11 mg/dL (ref 6–20)
CALCIUM: 9.5 mg/dL (ref 8.9–10.3)
CO2: 23 mmol/L (ref 22–32)
Chloride: 92 mmol/L — ABNORMAL LOW (ref 98–111)
Creatinine, Ser: 0.84 mg/dL (ref 0.61–1.24)
Glucose, Bld: 131 mg/dL — ABNORMAL HIGH (ref 70–99)
POTASSIUM: 4.3 mmol/L (ref 3.5–5.1)
Sodium: 127 mmol/L — ABNORMAL LOW (ref 135–145)

## 2018-04-29 MED ORDER — SACUBITRIL-VALSARTAN 97-103 MG PO TABS: 1.0000 | ORAL_TABLET | Freq: Two times a day (BID) | ORAL | 5 refills | Status: DC

## 2018-04-29 NOTE — Patient Instructions (Signed)
It was great to meet you today!  Please INCREASE your Entresto to 97-103 mg TWICE DAILY.   Blood work today. We will call you with any changes.   You are scheduled for blood work again on 05/13/18.  Please keep your appointment with Dr. Shirlee Latch on 06/11/18.

## 2018-04-29 NOTE — Progress Notes (Signed)
ST disability paperwork faxed to employer per patient request, form scanned into electronic medical records. Asked employer  Per pt request to forward to his email. Patient asked to remain out of work TBD pending ICD implant, CMRI, cardiac rehab, and follow up appt next month.  Ave Filter, RN

## 2018-04-29 NOTE — Progress Notes (Signed)
HF MD: [facility]  HPI:  57 y.o. with history of nonischemic cardiomyopathy and OSA was referred by Dr. Allyson Sabal for evaluation of CHF. He was initially diagnosed in early 2/18.  He had URI symptoms then developed dyspnea and put on weight. Echo was done in 2/18, showing EF 20-25%.  LHC in 2/18 showed normal coronaries. He was started on treatment for cardiomyopathy, and EF rose to 40% by 6/18. However, most recent echo in 6/19 showed EF back down to 15-20% with severe RV dilation/dysfunction.  CPX was done in 7/19, showed moderate functional limitation though submaximal.   Patient returns for pharmacist-led HF medication titration.  At last HF clinic visit on 7/30, his spironolactone was increased to 25 mg daily and he was started on sertraline 25 mg daily. He continues to lose weight with diet changes. No dyspnea walking on flat ground.  Still short of breath with moderate to heavy exertion.  He continues to have significant anxiety and takes lorazepam as needed along with the sertraline. He continues to complain of a dry cough which is worse when lying flat. Continues to use CPAP QHS.   Marland Kitchen Shortness of breath/dyspnea on exertion? Yes - better than it used to be . Orthopnea/PND? no . Edema? no . Lightheadedness/dizziness? no . Daily weights at home? Yes - stable ~229 lb  . Blood pressure/heart rate monitoring at home? no . Following low-sodium/fluid-restricted diet? yes  HF Medications: Carvedilol 25 mg PO BID Furosemide 40 mg PO BID KCl 20 mEq PO daily Entresto 49-51 mg PO BID Spironolactone 25 mg PO daily   Has the patient been experiencing any side effects to the medications prescribed?  no  Does the patient have any problems obtaining medications due to transportation or finances?   No - [employer] commercial - Entresto $10  Understanding of regimen: good Understanding of indications: good Potential of compliance: good Patient understands to avoid NSAIDs. Patient understands to avoid  decongestants.    Pertinent Lab Values: . 04/29/18: Serum creatinine 0.84, BUN 11, Potassium 4.3, Sodium 127  Vital Signs: . Weight: 233.6 lb (dry weight: 235 lb) . Blood pressure: 120/90 mmHg (121-140) . Heart rate: 62 bpm  . ReDS Clip: 36% (last visit it was 38%)  Assessment: 1. Chronicsystolic CHF (EF 73-41%), due to NICM (?viral). NYHA class IIsymptoms.  - Volume status stable  - Increase Entresto to goal 97-103 mg BID  - Continue carvedilol 25 mg BID, furosemide 40 mg BID, KCl 20 mEq daily, and spironolactone 25 mg daily  - Basic disease state pathophysiology, medication indication, mechanism and side effects reviewed at length with patient and he verbalized understanding  2. OSA:  - Continue CPAP  3. Anxiety: Generalized.  Would rather that he not take lorazepam regularly.  - Recently started sertraline 25 mg daily for generalized anxiety.   4. Erectile dysfunction:  - Using low dose Viagra  Plan: 1) Medication changes: Based on clinical presentation, vital signs and recent labs will increase Entresto to 97-103 mg BID 2) Labs: BMET today and in 2 weeks  3) Follow-up: Dr. Shirlee Latch on 9/26   Tyler Deis. Bonnye Fava, PharmD, BCPS, CPP Clinical Pharmacist Phone: [phone] 04/29/2018 8:57 AM

## 2018-04-30 ENCOUNTER — Telehealth (HOSPITAL_COMMUNITY): Payer: Self-pay | Admitting: *Deleted

## 2018-04-30 NOTE — Telephone Encounter (Signed)
Result Notes for Basic Metabolic Panel (BMET)   Notes recorded by Georgina Peer, RN on 04/30/2018 at 11:43 AM EDT Called and spoke with patient, he will limit fluids to less than 1800 cc per day. Scheduled for ICD placement next week and they will check labs that day. Will check labs next week and forward to DM. ------  Notes recorded by Laurey Morale, MD on 04/29/2018 at 11:33 PM EDT Low sodium, restrict po fluid (< 1800 cc/day). BMET 1 week

## 2018-05-01 ENCOUNTER — Encounter (HOSPITAL_COMMUNITY): Payer: 59

## 2018-05-01 ENCOUNTER — Encounter (HOSPITAL_COMMUNITY): Payer: Self-pay | Admitting: Cardiology

## 2018-05-04 ENCOUNTER — Encounter (HOSPITAL_COMMUNITY): Payer: 59

## 2018-05-06 ENCOUNTER — Ambulatory Visit (HOSPITAL_COMMUNITY)
Admission: RE | Disposition: A | Discharge status: Home / Self Care | Payer: 59 | Source: Ambulatory Visit | Attending: Cardiovascular Disease

## 2018-05-06 ENCOUNTER — Encounter (HOSPITAL_COMMUNITY): Payer: 59

## 2018-05-06 ENCOUNTER — Other Ambulatory Visit: Payer: Self-pay | Admitting: Cardiovascular Disease

## 2018-05-06 ENCOUNTER — Ambulatory Visit (HOSPITAL_COMMUNITY)
Admission: RE | Admit: 2018-05-06 | Discharge: 2018-05-07 | Disposition: A | Discharge status: Home / Self Care | Payer: 59 | Source: Ambulatory Visit | Attending: Cardiovascular Disease | Admitting: Cardiovascular Disease

## 2018-05-06 DIAGNOSIS — Z7982 Long term (current) use of aspirin: Secondary | ICD-10-CM | POA: Diagnosis not present

## 2018-05-06 DIAGNOSIS — Z9189 Other specified personal risk factors, not elsewhere classified: Secondary | ICD-10-CM

## 2018-05-06 DIAGNOSIS — Z006 Encounter for examination for normal comparison and control in clinical research program: Secondary | ICD-10-CM | POA: Insufficient documentation

## 2018-05-06 DIAGNOSIS — Z79899 Other long term (current) drug therapy: Secondary | ICD-10-CM | POA: Insufficient documentation

## 2018-05-06 DIAGNOSIS — N529 Male erectile dysfunction, unspecified: Secondary | ICD-10-CM | POA: Insufficient documentation

## 2018-05-06 DIAGNOSIS — F329 Major depressive disorder, single episode, unspecified: Secondary | ICD-10-CM | POA: Diagnosis not present

## 2018-05-06 DIAGNOSIS — R9431 Abnormal electrocardiogram [ECG] [EKG]: Secondary | ICD-10-CM | POA: Diagnosis not present

## 2018-05-06 DIAGNOSIS — I428 Other cardiomyopathies: Secondary | ICD-10-CM

## 2018-05-06 DIAGNOSIS — I5022 Chronic systolic (congestive) heart failure: Secondary | ICD-10-CM | POA: Diagnosis not present

## 2018-05-06 DIAGNOSIS — I5082 Biventricular heart failure: Secondary | ICD-10-CM | POA: Insufficient documentation

## 2018-05-06 DIAGNOSIS — I429 Cardiomyopathy, unspecified: Secondary | ICD-10-CM | POA: Insufficient documentation

## 2018-05-06 DIAGNOSIS — G4733 Obstructive sleep apnea (adult) (pediatric): Secondary | ICD-10-CM | POA: Insufficient documentation

## 2018-05-06 DIAGNOSIS — F411 Generalized anxiety disorder: Secondary | ICD-10-CM | POA: Insufficient documentation

## 2018-05-06 DIAGNOSIS — Z8249 Family history of ischemic heart disease and other diseases of the circulatory system: Secondary | ICD-10-CM | POA: Insufficient documentation

## 2018-05-06 DIAGNOSIS — E871 Hypo-osmolality and hyponatremia: Secondary | ICD-10-CM | POA: Diagnosis not present

## 2018-05-06 DIAGNOSIS — Z95810 Presence of automatic (implantable) cardiac defibrillator: Secondary | ICD-10-CM

## 2018-05-06 HISTORY — PX: ICD IMPLANT: EP1208

## 2018-05-06 LAB — BASIC METABOLIC PANEL
ANION GAP: 11 (ref 5–15)
BUN: 9 mg/dL (ref 6–20)
CO2: 25 mmol/L (ref 22–32)
Calcium: 9.5 mg/dL (ref 8.9–10.3)
Chloride: 92 mmol/L — ABNORMAL LOW (ref 98–111)
Creatinine, Ser: 0.75 mg/dL (ref 0.61–1.24)
Glucose, Bld: 106 mg/dL — ABNORMAL HIGH (ref 70–99)
Potassium: 4.8 mmol/L (ref 3.5–5.1)
SODIUM: 128 mmol/L — AB (ref 135–145)

## 2018-05-06 LAB — CBC
HCT: 40.6 % (ref 39.0–52.0)
Hemoglobin: 13.4 g/dL (ref 13.0–17.0)
MCH: 31.5 pg (ref 26.0–34.0)
MCHC: 33 g/dL (ref 30.0–36.0)
MCV: 95.5 fL (ref 78.0–100.0)
Platelets: 298 10*3/uL (ref 150–400)
RBC: 4.25 MIL/uL (ref 4.22–5.81)
RDW: 11.8 % (ref 11.5–15.5)
WBC: 9 10*3/uL (ref 4.0–10.5)

## 2018-05-06 LAB — SURGICAL PCR SCREEN
MRSA, PCR: NEGATIVE
STAPHYLOCOCCUS AUREUS: NEGATIVE

## 2018-05-06 LAB — PROTIME-INR
INR: 1.03
Prothrombin Time: 13.4 seconds (ref 11.4–15.2)

## 2018-05-06 SURGERY — ICD IMPLANT

## 2018-05-06 MED ORDER — FUROSEMIDE 40 MG PO TABS
40.0000 mg | ORAL_TABLET | Freq: Every evening | ORAL | Status: DC
  Administered 2018-05-06: 40 mg via ORAL
  Filled 2018-05-06: qty 1

## 2018-05-06 MED ORDER — SPIRONOLACTONE 25 MG PO TABS
25.0000 mg | ORAL_TABLET | Freq: Every day | ORAL | Status: DC
  Administered 2018-05-07: 25 mg via ORAL
  Filled 2018-05-06: qty 1

## 2018-05-06 MED ORDER — SODIUM CHLORIDE 0.9 % IV SOLN: INTRAVENOUS | Status: DC

## 2018-05-06 MED ORDER — SODIUM CHLORIDE 0.9 % IV SOLN
80.0000 mg | INTRAVENOUS | Status: AC
  Administered 2018-05-06: 80 mg
  Filled 2018-05-06: qty 2

## 2018-05-06 MED ORDER — CHLORHEXIDINE GLUCONATE 4 % EX LIQD
60.0000 mL | Freq: Once | CUTANEOUS | Status: DC
  Filled 2018-05-06: qty 60

## 2018-05-06 MED ORDER — HEPARIN (PORCINE) IN NACL 1000-0.9 UT/500ML-% IV SOLN
INTRAVENOUS | Status: DC | PRN
  Administered 2018-05-06: 500 mL

## 2018-05-06 MED ORDER — MIDAZOLAM HCL 5 MG/5ML IJ SOLN
INTRAMUSCULAR | Status: AC
  Filled 2018-05-06: qty 5

## 2018-05-06 MED ORDER — LIDOCAINE HCL (PF) 1 % IJ SOLN
INTRAMUSCULAR | Status: AC
  Filled 2018-05-06: qty 30

## 2018-05-06 MED ORDER — CEFAZOLIN SODIUM-DEXTROSE 1-4 GM/50ML-% IV SOLN
1.0000 g | Freq: Four times a day (QID) | INTRAVENOUS | Status: AC
  Administered 2018-05-06 – 2018-05-07 (×3): 1 g via INTRAVENOUS
  Filled 2018-05-06 (×3): qty 50

## 2018-05-06 MED ORDER — POTASSIUM CHLORIDE CRYS ER 20 MEQ PO TBCR
20.0000 meq | EXTENDED_RELEASE_TABLET | Freq: Every day | ORAL | Status: DC
  Administered 2018-05-07: 20 meq via ORAL
  Filled 2018-05-06: qty 1

## 2018-05-06 MED ORDER — SODIUM CHLORIDE 0.9 % IV SOLN
INTRAVENOUS | Status: DC
  Administered 2018-05-06: 15:00:00 via INTRAVENOUS

## 2018-05-06 MED ORDER — CARVEDILOL 25 MG PO TABS
25.0000 mg | ORAL_TABLET | Freq: Two times a day (BID) | ORAL | Status: DC
  Administered 2018-05-07: 09:00:00 25 mg via ORAL
  Filled 2018-05-06: qty 1
  Filled 2018-05-06: qty 2

## 2018-05-06 MED ORDER — ROSUVASTATIN CALCIUM 20 MG PO TABS
20.0000 mg | ORAL_TABLET | Freq: Every day | ORAL | Status: DC
  Administered 2018-05-07: 20 mg via ORAL
  Filled 2018-05-06: qty 1

## 2018-05-06 MED ORDER — IOPAMIDOL (ISOVUE-370) INJECTION 76%
INTRAVENOUS | Status: DC | PRN
  Administered 2018-05-06: 10 mL

## 2018-05-06 MED ORDER — SERTRALINE HCL 25 MG PO TABS
25.0000 mg | ORAL_TABLET | Freq: Every day | ORAL | Status: DC
  Administered 2018-05-07: 10:00:00 25 mg via ORAL
  Filled 2018-05-06: qty 1

## 2018-05-06 MED ORDER — SODIUM CHLORIDE 0.9 % IV SOLN
INTRAVENOUS | Status: AC
  Filled 2018-05-06: qty 2

## 2018-05-06 MED ORDER — ASPIRIN 81 MG PO CHEW
81.0000 mg | CHEWABLE_TABLET | Freq: Every day | ORAL | Status: DC
  Administered 2018-05-07: 81 mg via ORAL
  Filled 2018-05-06: qty 1

## 2018-05-06 MED ORDER — LORAZEPAM 0.5 MG PO TABS: 0.5000 mg | ORAL_TABLET | Freq: Three times a day (TID) | ORAL | Status: DC | PRN

## 2018-05-06 MED ORDER — CEFAZOLIN SODIUM-DEXTROSE 2-4 GM/100ML-% IV SOLN
2.0000 g | INTRAVENOUS | Status: AC
  Administered 2018-05-06: 2 g via INTRAVENOUS
  Filled 2018-05-06: qty 100

## 2018-05-06 MED ORDER — OXYCODONE-ACETAMINOPHEN 5-325 MG PO TABS
1.0000 | ORAL_TABLET | ORAL | Status: DC | PRN
  Administered 2018-05-06 – 2018-05-07 (×3): 2 via ORAL
  Filled 2018-05-06 (×3): qty 2

## 2018-05-06 MED ORDER — SODIUM CHLORIDE 0.9 % IV SOLN
INTRAVENOUS | Status: DC | PRN
  Administered 2018-05-06 – 2018-05-07 (×2): 250 mL via INTRAVENOUS

## 2018-05-06 MED ORDER — ACETAMINOPHEN 325 MG PO TABS: 325.0000 mg | ORAL_TABLET | ORAL | Status: DC | PRN

## 2018-05-06 MED ORDER — MUPIROCIN 2 % EX OINT
TOPICAL_OINTMENT | Freq: Once | CUTANEOUS | Status: AC
  Administered 2018-05-06: 15:00:00 via NASAL
  Filled 2018-05-06: qty 22

## 2018-05-06 MED ORDER — FENTANYL CITRATE (PF) 100 MCG/2ML IJ SOLN
INTRAMUSCULAR | Status: AC
  Filled 2018-05-06: qty 2

## 2018-05-06 MED ORDER — IOPAMIDOL (ISOVUE-370) INJECTION 76%
INTRAVENOUS | Status: AC
  Filled 2018-05-06: qty 50

## 2018-05-06 MED ORDER — ONDANSETRON HCL 4 MG/2ML IJ SOLN: 4.0000 mg | Freq: Four times a day (QID) | INTRAMUSCULAR | Status: DC | PRN

## 2018-05-06 MED ORDER — YOU HAVE A PACEMAKER BOOK
Freq: Once | Status: AC
  Administered 2018-05-06: 22:00:00
  Filled 2018-05-06: qty 1

## 2018-05-06 MED ORDER — SACUBITRIL-VALSARTAN 97-103 MG PO TABS
1.0000 | ORAL_TABLET | Freq: Two times a day (BID) | ORAL | Status: DC
  Administered 2018-05-06 – 2018-05-07 (×2): 1 via ORAL
  Filled 2018-05-06 (×2): qty 1

## 2018-05-06 MED ORDER — HEPARIN (PORCINE) IN NACL 1000-0.9 UT/500ML-% IV SOLN
INTRAVENOUS | Status: AC
  Filled 2018-05-06: qty 500

## 2018-05-06 MED ORDER — FUROSEMIDE 80 MG PO TABS
80.0000 mg | ORAL_TABLET | Freq: Every morning | ORAL | Status: DC
  Administered 2018-05-07: 11:00:00 80 mg via ORAL
  Filled 2018-05-06 (×2): qty 1

## 2018-05-06 MED ORDER — MIDAZOLAM HCL 5 MG/5ML IJ SOLN
INTRAMUSCULAR | Status: DC | PRN
  Administered 2018-05-06 (×3): 2 mg via INTRAVENOUS

## 2018-05-06 MED ORDER — CEFAZOLIN SODIUM-DEXTROSE 2-4 GM/100ML-% IV SOLN
INTRAVENOUS | Status: AC
  Filled 2018-05-06: qty 100

## 2018-05-06 MED ORDER — LIDOCAINE HCL (PF) 1 % IJ SOLN
INTRAMUSCULAR | Status: DC | PRN
  Administered 2018-05-06: 60 mL

## 2018-05-06 MED ORDER — FENTANYL CITRATE (PF) 100 MCG/2ML IJ SOLN
INTRAMUSCULAR | Status: DC | PRN
  Administered 2018-05-06 (×3): 25 ug via INTRAVENOUS

## 2018-05-06 MED ORDER — ACETAMINOPHEN 325 MG PO TABS: 650.0000 mg | ORAL_TABLET | Freq: Four times a day (QID) | ORAL | Status: DC | PRN

## 2018-05-06 SURGICAL SUPPLY — 6 items
CABLE SURGICAL S-101-97-12 (CABLE) ×2 IMPLANT
ICD ELLIPSE VR CD1411-36Q (ICD Generator) ×2 IMPLANT
LEAD DURATA 7122Q-65CM (Lead) ×2 IMPLANT
PAD PRO RADIOLUCENT 2001M-C (PAD) ×2 IMPLANT
SHEATH CLASSIC 7F (SHEATH) ×2 IMPLANT
TRAY PACEMAKER INSERTION (PACKS) ×2 IMPLANT

## 2018-05-06 NOTE — Progress Notes (Signed)
Pt arrived to room 6c07 at this time.  Oriented to room and unit.  No s/s of any acute distress noted.  Call bell in reach.  VSS.

## 2018-05-06 NOTE — Op Note (Addendum)
Procedure report  Procedure performed: 1. Implantation of new single chamber cardioverter defibrillator 2. Fluoroscopy 3. Moderate sedation 4. Initial lead and generator testing  Reason for procedure: Primary prevention of sudden cardiac death Nonischemic cardiomyopathy,  left ventricular ejection fraction less than 35%, Heart failure NYHA class 2, on comprehensive medical therapy for over 90 days (SCD-HeFT)  Procedure performed by: Thurmon Fair, MD  Complications: None  Estimated blood loss: <10 mL  Medications administered during procedure: Ancef 2 g intravenously Lidocaine 1% 30 mL locally,  Fentanyl 75 mcg intravenously Versed 6 mg intravenously Isovue 10 mL IV for venogram  During this procedure the patient is administered a total of Versed 6 mg and Fentanyl 75 mcg to achieve and maintain moderate conscious sedation.  The patient's heart rate, blood pressure, and oxygen saturation are monitored continuously during the procedure. The period of conscious sedation is 45 minutes, of which I was present face-to-face 100% of this time.   Device detailsNaval architect. Jude Cocoa VR model 2761-47W serial number [id number] Right ventricular lead St. Jude Durata [phone] serial number [id number]  Procedure details:  After the risks and benefits of the procedure were discussed the patient provided informed consent and was brought to the cardiac cath lab in the fasting state. The patient was prepped and draped in usual sterile fashion. Local anesthesia with 1% lidocaine was administered to to the left infraclavicular area. A 5-6 cm horizontal incision was made parallel with and 2-3 cm caudal to the left clavicle. Using electrocautery and blunt dissection a prepectoral pocket was created down to the level of the pectoralis major muscle fascia. The pocket was carefully inspected for hemostasis. An antibiotic-soaked sponge was placed in the pocket.  There was difficulty accessing the  left subclavian vein and a left upper extremity venogram was performed. The vein was about 6 cm caudally displaced relative to the left clavicle. Under fluoroscopic guidance and using the modified Seldinger technique a venipuncture was performed to access the left subclavian vein. No difficulty was encountered accessing the vein.  A J-tip guidewire was subsequently exchanged for a7 French safe sheath.  Under fluoroscopic guidance the ventricular lead was advanced to level of the apical right ventricular septum and the active-fixation helix was deployed. Prominent current of injury was seen. Satisfactory pacing and sensing parameters were recorded. There was no evidence of diaphragmatic stimulation at maximum device output. The safe sheath was peeled away and the lead was secured in place with 2-0 silk.  The antibiotic-soaked sponge was removed from the pocket. The pocket was flushed with copious amounts of antibiotic solution. Reinspection showed excellent hemostasis.  The ventricular lead was connected to the generator and appropriate ventricular pacing was seen. Repeat testing of the lead parameters via telemetry showed excellent values.  The entire system was then carefully inserted in the pocket with care been taking that the lead and device assumed a comfortable position without pressure on the incision. Great care was taken that the lead be located deep to the generator. The pocket was then closed in layers using 2 layers of 2-0 Vicryl and cutaneous staples, after which a sterile dressing was applied.  Defibrillation threshold testing was not performed.  At the end of the procedure the following lead parameters were encountered: sensed R waves 33.3 mV, impedance 635 ohms, threshold 0.6 V at 0.5 ms pulse width.  High voltage impedance 78 ohm  Thurmon Fair, MD, [facility] [phone] office [phone] pager

## 2018-05-06 NOTE — Telephone Encounter (Signed)
Patient currently having ICD placed, bmet drawn this morning.  Sending to Dr. Shirlee Latch to review.

## 2018-05-06 NOTE — Progress Notes (Signed)
ICD Criteria  Current LVEF:15-20%. Within 12 months prior to implant: Yes   Heart failure history: Yes, Class II  Cardiomyopathy history: Yes, Non-Ischemic Cardiomyopathy.  Atrial Fibrillation/Atrial Flutter: No.  Ventricular tachycardia history: No.  Cardiac arrest history: No.  History of syndromes with risk of sudden death: No.  Previous ICD: No.  Current ICD indication: Primary  PPM indication: No.   Class I or II Bradycardia indication present: No  Beta Blocker therapy for 3 or more months: Yes, prescribed.   Ace Inhibitor/ARB therapy for 3 or more months: Yes, prescribed.

## 2018-05-06 NOTE — Interval H&P Note (Signed)
History and Physical Interval Note:  05/06/2018 1:27 PM  John [name]  has presented today for surgery, with the diagnosis of cardiomyopathy. the various methods of treatment have been discussed with the patient and family. After consideration of risks, benefits and other options for treatment, the patient has consented to  Procedure(s): ICD IMPLANT (N/A) as a surgical intervention .  The patient's history has been reviewed, patient examined, no change in status, stable for surgery.  I have reviewed the patient's chart and labs.  Questions were answered to the patient's satisfaction.     Ruvim Risko

## 2018-05-07 ENCOUNTER — Other Ambulatory Visit: Payer: Self-pay

## 2018-05-07 ENCOUNTER — Ambulatory Visit (HOSPITAL_COMMUNITY): Payer: 59

## 2018-05-07 ENCOUNTER — Encounter (HOSPITAL_COMMUNITY): Payer: Self-pay | Admitting: Cardiovascular Disease

## 2018-05-07 DIAGNOSIS — I429 Cardiomyopathy, unspecified: Secondary | ICD-10-CM | POA: Diagnosis not present

## 2018-05-07 MED ORDER — FUROSEMIDE 80 MG PO TABS: ORAL_TABLET | ORAL | 3 refills | Status: DC

## 2018-05-07 NOTE — Discharge Summary (Signed)
Discharge Summary    Patient ID: John [name],  MRN: [id number], DOB/AGE: Mar 09, 1961 57 y.o.  Admit date: 05/06/2018 Discharge date: 05/07/2018  Primary Care Provider: Margaree Mackintosh Primary Cardiologist: Marca Ancona, MD  Discharge Diagnoses    Principal Problem:   Non-ischemic cardiomyopathy [facility]) Active Problems:   At risk for sudden cardiac death   Cardiomyopathy [facility])   Allergies No Known Allergies  Diagnostic Studies/Procedures    ICD Implantation 05/06/18: Device details: Generator [employer] Pringle VR model 4098-11B serial number [id number] Right ventricular lead [employer] Durata [id number] serial number [id number] _____________   History of Present Illness     57 y.o. with history of nonischemic cardiomyopathy and OSA was referred by Dr. Allyson Sabal for evaluation of CHF.  He was initially diagnosed in early 2/18.  He had URI symptoms then developed dyspnea and put on weight. Echo was done in 2/18, showing EF 20-25%.  LHC in 2/18 showed normal coronaries. He was started on treatment for cardiomyopathy, and EF rose to 40% by 6/18. However, most recent echo in 6/19 showed EF back down to 15-20% with severe RV dilation/dysfunction.  CPX was done in 7/19, showed moderate functional limitation though submaximal.   Patient returns for followup of CHF.  At last appointment, I adjusted his medications.  He says that he is mostly in the "green zone" now.  This seems like an improvement compared to prior. Weight is down 5 lbs.  No dyspnea walking on flat ground.  Still short of breath with moderate to heavy exertion.  He has significant anxiety and takes lorazepam as needed.  No chest pain.  Uses CPAP at night, no orthopnea/PND.  No lightheadedness, tolerating meds.    Hospital Course     Consultants: None   1. Non-ischemic cardiomyopathy: patient follows outpatient with Dr. Shirlee Latch. Last EF 15-20% with severe RV dilation/dysfunction. He was recommended to undergo an ICD placement  for primary prevention. Patient tolerated the procedure well. CXR post op without PTX. Patient reported persistent supine cough and lasix dose was adjusted prior to discharge.  - Continue coreg, entresto, and spironolactone - Continue lasix 80mg  in the morning and 40mg  in the evening daily - Wound check scheduled for 9/3 and follow-up with Dr. Royann Shivers is scheduled for 11/27.  - Continue routine follow-up with Dr. Shirlee Latch  2. Hyponatremia: Na 128 on admission. Patient was recommended for fluid restriction of per day.  - Continue routine monitoring outpatient  _____________  Discharge Vitals Blood pressure 129/82, pulse 71, temperature 98.2 F (36.8 C), resp. rate 18, height 5\' 11"  (1.803 m), weight 105.6 kg, SpO2 100 %.  Filed Weights   05/06/18 1336 05/06/18 1900 05/07/18 0254  Weight: 105.2 kg 105.2 kg 105.6 kg    Labs & Radiologic Studies    CBC Recent Labs    05/06/18 1429  WBC 9.0  HGB 13.4  HCT 40.6  MCV 95.5  PLT 298   Basic Metabolic Panel Recent Labs    84/69/62 1429  NA 128*  K 4.8  CL 92*  CO2 25  GLUCOSE 106*  BUN 9  CREATININE 0.75  CALCIUM 9.5   Liver Function Tests No results for input(s): AST, ALT, ALKPHOS, BILITOT, PROT, ALBUMIN in the last 72 hours. No results for input(s): LIPASE, AMYLASE in the last 72 hours. Cardiac Enzymes No results for input(s): CKTOTAL, CKMB, CKMBINDEX, TROPONINI in the last 72 hours. BNP Invalid input(s): POCBNP D-Dimer No results for input(s): DDIMER in the last 72 hours.  Hemoglobin A1C No results for input(s): HGBA1C in the last 72 hours. Fasting Lipid Panel No results for input(s): CHOL, HDL, LDLCALC, TRIG, CHOLHDL, LDLDIRECT in the last 72 hours. Thyroid Function Tests No results for input(s): TSH, T4TOTAL, T3FREE, THYROIDAB in the last 72 hours.  Invalid input(s): FREET3 _____________  Dg Chest 2 View  Result Date: 05/07/2018 CLINICAL DATA:  ICD. EXAM: CHEST - 2 VIEW COMPARISON:  Chest x-ray  10/09/2017. FINDINGS: Cardiac pacer with lead tip over the right ventricle. Cardiomegaly with normal pulmonary vascularity. No focal infiltrate. No pleural effusion or pneumothorax. Right shoulder replacement. Plate and screw fixation left humerus. IMPRESSION: 1. Cardiac pacer with lead tip over the right ventricle. Cardiomegaly with normal pulmonary vascularity. 2.  No acute pulmonary disease. Electronically Signed   By: Maisie Fus  Register   On: 05/07/2018 07:49   Mr Cardiac Morphology W Wo Contrast  Result Date: 04/14/2018 CLINICAL DATA:  Cardiomyopathy of uncertain etiology. EXAM: CARDIAC MRI TECHNIQUE: The patient was scanned on a 1.5 Tesla GE magnet. A dedicated cardiac coil was used. Functional imaging was done using Fiesta sequences. 2,3, and 4 chamber views were done to assess for RWMA's. Modified Simpson's rule using a short axis stack was used to calculate an ejection fraction on a dedicated work [employer]. The patient received 30 cc of Multihance. After 10 minutes inversion recovery sequences were used to assess for infiltration and scar tissue. FINDINGS: Limited images of the lung fields showed no definitive abnormalities. Mildly dilated left ventricle with normal wall thickness. No evidence for LV noncompaction. EF 29%, diffuse hypokinesis. Mildy dilated right ventricle with mildly decreased systolic function, EF 40%. Mildly dilated left atrium. Normal right atrial size. Trileaflet aortic valve with no significant stenosis or regurgitation. There was no significant mitral regurgitation noted though flow sequences to quantify were not done. On delayed enhancement images, there was mid-wall late gadolinium enhancement (LGE) throughout the septal wall and the inferior wall. Measurements: LVEDV 270 mL LVSV 79 mL LVEF 29% RVEDV 129 mL RVSV 52 mL RVEF 40% IMPRESSION: 1.  Mildly dilated LV with EF 29%, diffuse hypokinesis. 2. Mildly dilated RV with mildly decreased systolic function, EF  40%. 3. Extensive mid-wall LGE throughout the septal wall and the inferior wall. This is concerning for prior viral myocarditis. Dalton Mclean Electronically Signed   By: Marca Ancona M.D.   On: 04/14/2018 21:34   Disposition   Patient was seen and examined by Dr. Royann Shivers who deemed patient as stable for discharge. Follow-up has been arranged. Discharge medications as listed below.   Follow-up Plans & Appointments    Follow-up Information    [facility] Follow up on 05/19/2018.   Specialty:  Cardiology Why:  Please arrive 15 minutes early for your 4:00pm wound check appointment.  Contact information: [address] [place] [address] [phone]       [facility] Follow up on 05/13/2018.   Specialty:  Cardiology Why:  Please arrive 15 minutes early for your 11am appointment Contact information: [address] [id number] mc [address] [place] [address] [phone]       Thurmon Fair, MD Follow up on 08/12/2018.   Specialty:  Cardiology Why:  Please arrive 15 minutes early for your 9am appointment Contact information: [address] [place] [address] [phone]          Discharge Instructions    Diet - low sodium heart healthy  Complete by:  As directed    Increase activity slowly   Complete by:  As directed       Discharge Medications   Allergies as of 05/07/2018   No Known Allergies     Medication List    TAKE these medications   acetaminophen 325 MG tablet Commonly known as:  TYLENOL Take 650 mg by mouth every 6 (six) hours as needed for mild pain.   aspirin 81 MG chewable tablet Chew 1 tablet (81 mg total) by mouth daily.   carvedilol 25 MG tablet Commonly known as:  COREG Take 1 tablet (25 mg total) by mouth 2 (two) times daily with a meal.   cetirizine 10 MG chewable tablet Commonly known as:  ZYRTEC Chew 10 mg by  mouth daily.   furosemide 80 MG tablet Commonly known as:  LASIX Take 1 tablet (80 mg total) by mouth every morning, then take 1/2 tablet (40mg  total) by mouth every evening. What changed:    medication strength  how much to take  how to take this  when to take this  additional instructions   LORazepam 1 MG tablet Commonly known as:  ATIVAN Take 0.5-1 mg by mouth every 8 (eight) hours as needed for anxiety.   multivitamin with minerals tablet Take 1 tablet by mouth daily.   potassium chloride SA 20 MEQ tablet Commonly known as:  K-DUR,KLOR-CON Take 20 mEq by mouth daily.   rosuvastatin 20 MG tablet Commonly known as:  CRESTOR Take 20 mg by mouth daily.   sacubitril-valsartan 97-103 MG Commonly known as:  ENTRESTO Take 1 tablet by mouth 2 (two) times daily.   sertraline 25 MG tablet Commonly known as:  ZOLOFT Take 1 tablet (25 mg total) by mouth daily.   sildenafil 100 MG tablet Commonly known as:  VIAGRA Take 0.5 tablets (50 mg total) by mouth daily as needed for erectile dysfunction.   spironolactone 25 MG tablet Commonly known as:  ALDACTONE Take 1 tablet (25 mg total) by mouth daily.        Outstanding Labs/Studies   Wound check 05/19/18  Duration of Discharge Encounter   Greater than 30 minutes including physician time.  Signed, Beatriz Stallion PA-C 05/07/2018, 9:51 AM

## 2018-05-07 NOTE — Discharge Instructions (Signed)
Supplemental Discharge Instructions for  Pacemaker/Defibrillator Patients  Activity Do not raise your left/right arm above shoulder level or extend it backward beyond shoulder level for 2 weeks. Wear the arm sling as a reminder or as needed for comfort for 2 weeks. No heavy lifting or vigorous activity with your left/right arm for 6-8 weeks.    NO DRIVING is preferable for 2 weeks; If absolutely necessary, drive only short, familiar routes. DO wear your seatbelt, even if it crosses over the pacemaker site.  WOUND CARE - Keep the wound area clean and dry.  Remove the dressing the day after you return home (usually 48 hours after the procedure). - DO NOT SUBMERGE UNDER WATER UNTIL FULLY HEALED (no tub baths, hot tubs, swimming pools, etc.).  - You  may shower or take a sponge bath after the dressing is removed. DO NOT SOAK the area and do not allow the shower to directly spray on the site. - If you have staples, these will be removed in the office in 7-14 days. - If you have tape/steri-strips on your wound, these will fall off; do not pull them off prematurely.   - No bandage is needed on the site.  DO  NOT apply any creams, oils, or ointments to the wound area. - If you notice any drainage or discharge from the wound, any swelling, excessive redness or bruising at the site, or if you develop a fever > 101? F after you are discharged home, call the office at once.  Special Instructions - You are still able to use cellular telephones.  Avoid carrying your cellular phone near your device. - When traveling through airports, show security personnel your identification card to avoid being screened in the metal detectors.  - Avoid arc welding equipment, MRI testing (magnetic resonance imaging), TENS units (transcutaneous nerve stimulators).  Call the office for questions about other devices. - Avoid electrical appliances that are in poor condition or are not properly grounded. - Microwave ovens are  safe to be near or to operate.  Additional information for defibrillator patients should your device go off: - If your device goes off ONCE and you feel fine afterward, notify the clinic at [phone]. - If your device goes off ONCE and you do not feel well afterward, call 911. - If your device goes off TWICE or more in one day, call 911.  DO NOT DRIVE YOURSELF OR A FAMILY MEMBER WITH A DEFIBRILLATOR TO THE HOSPITAL--CALL 911.  Limit your fluid intake to per day   You dose of lasix was increased to 80mg  in the morning and 40 mg in the evening.

## 2018-05-07 NOTE — Progress Notes (Signed)
,hcro   Progress Note  Patient Name: John [name] Date of Encounter: 05/07/2018  Primary Cardiologist: No primary care provider on file.   Subjective   Minimal soreness.  Inpatient Medications    Scheduled Meds: . aspirin  81 mg Oral Daily  . carvedilol  25 mg Oral BID WC  . furosemide  40 mg Oral QPM  . furosemide  80 mg Oral q morning - 10a  . potassium chloride SA  20 mEq Oral Daily  . rosuvastatin  20 mg Oral Daily  . sacubitril-valsartan  1 tablet Oral BID  . sertraline  25 mg Oral Daily  . spironolactone  25 mg Oral Daily   Continuous Infusions: . sodium chloride 250 mL (05/07/18 0314)  .  ceFAZolin (ANCEF) IV Stopped (05/07/18 0400)   PRN Meds: sodium chloride, acetaminophen, acetaminophen, LORazepam, ondansetron (ZOFRAN) IV, oxyCODONE-acetaminophen   Vital Signs    Vitals:   05/06/18 2000 05/06/18 2229 05/07/18 0254 05/07/18 0311  BP:   (!) 136/94   Pulse: (!) 59 64 64   Resp: 20 20 20 18   Temp:   98.4 F (36.9 C)   TempSrc:   Oral   SpO2: 98% 97% 97%   Weight:   105.6 kg   Height:        Intake/Output Summary (Last 24 hours) at 05/07/2018 0711 Last data filed at 05/07/2018 0400 Gross per 24 hour  Intake 820 ml  Output 1000 ml  Net -180 ml   Filed Weights   05/06/18 1336 05/06/18 1900 05/07/18 0254  Weight: 105.2 kg 105.2 kg 105.6 kg    Telemetry    NSR - Personally Reviewed  ECG    NSR, minor nonspecific T wave changes - Personally Reviewed  Physical Exam  Minor oozing on dressing GEN: No acute distress.   Neck: No JVD Cardiac: RRR, no murmurs, rubs, or gallops.  Respiratory: Clear to auscultation bilaterally. GI: Soft, nontender, non-distended  MS: No edema; No deformity. Neuro:  Nonfocal  Psych: Normal affect   Labs    Chemistry Recent Labs  Lab 05/06/18 1429  NA 128*  K 4.8  CL 92*  CO2 25  GLUCOSE 106*  BUN 9  CREATININE 0.75  CALCIUM 9.5  GFRNONAA >60  GFRAA >60  ANIONGAP 11     Hematology Recent Labs    Lab 05/06/18 1429  WBC 9.0  RBC 4.25  HGB 13.4  HCT 40.6  MCV 95.5  MCH 31.5  MCHC 33.0  RDW 11.8  PLT 298    Cardiac EnzymesNo results for input(s): TROPONINI in the last 168 hours. No results for input(s): TROPIPOC in the last 168 hours.   BNPNo results for input(s): BNP, PROBNP in the last 168 hours.   DDimer No results for input(s): DDIMER in the last 168 hours.   Radiology    On my review, lead position appropriate, no pneumothorax  Cardiac Studies   R waves 11.8 mV, threshold 0.75V@0 .5 ms, impedance 550/75 ohm  Patient Profile     57 y.o. male s/p primary prevention ICD implantation for nonischemic CMP.  Assessment & Plan    DC home.  Wound check 10-14 days. Activity restrictions and wound care reviewed. 1800 mL fluid restriction for persistent hyponatremia.  Also slight increase in diuretic dose for persistent supine cough with clear sputum. Furosemide 80 mg AM + 40 mg PM. Office visit with me in 3 months.  For questions or updates, please contact [facility] Please consult [email or web address] for contact info under  Cardiology/STEMI.      Signed, Thurmon Fair, MD  05/07/2018, 7:11 AM

## 2018-05-08 ENCOUNTER — Encounter (HOSPITAL_COMMUNITY): Payer: 59

## 2018-05-08 ENCOUNTER — Telehealth: Payer: Self-pay

## 2018-05-08 NOTE — Telephone Encounter (Signed)
Pt has questions on wound care and when its ok to take a shower. He device was implanted on Wednesday 21st.

## 2018-05-08 NOTE — Telephone Encounter (Signed)
Spoke with pt informed him that he could take the clear dressing off, but to leave the steri-strips in place until wound check apt on 05/19/2018. Informed pt to not get bandage we until 7 days s/p procedure and then minimize the amount of water that touches the bandage, pt voiced understanding.

## 2018-05-13 ENCOUNTER — Ambulatory Visit (HOSPITAL_COMMUNITY)
Admission: RE | Admit: 2018-05-13 | Discharge: 2018-05-13 | Disposition: A | Discharge status: Home / Self Care | Payer: 59 | Source: Ambulatory Visit | Attending: Internal Medicine | Admitting: Internal Medicine

## 2018-05-13 DIAGNOSIS — I429 Cardiomyopathy, unspecified: Secondary | ICD-10-CM | POA: Insufficient documentation

## 2018-05-13 LAB — BASIC METABOLIC PANEL
ANION GAP: 11 (ref 5–15)
BUN: 11 mg/dL (ref 6–20)
CO2: 22 mmol/L (ref 22–32)
Calcium: 9.7 mg/dL (ref 8.9–10.3)
Chloride: 95 mmol/L — ABNORMAL LOW (ref 98–111)
Creatinine, Ser: 0.72 mg/dL (ref 0.61–1.24)
GFR calc Af Amer: >60 mL/min (ref >60)
GLUCOSE: 102 mg/dL — AB (ref 70–99)
POTASSIUM: 4.9 mmol/L (ref 3.5–5.1)
SODIUM: 128 mmol/L — AB (ref 135–145)

## 2018-05-19 ENCOUNTER — Ambulatory Visit (INDEPENDENT_AMBULATORY_CARE_PROVIDER_SITE_OTHER): Payer: 59 | Admitting: *Deleted

## 2018-05-19 DIAGNOSIS — I5022 Chronic systolic (congestive) heart failure: Secondary | ICD-10-CM

## 2018-05-19 DIAGNOSIS — I428 Other cardiomyopathies: Secondary | ICD-10-CM | POA: Diagnosis not present

## 2018-05-19 LAB — CUP PACEART INCLINIC DEVICE CHECK
Battery Remaining Longevity: 102 mo
HighPow Impedance: 65.25 Ohm
Implantable Lead Location: 753860
Implantable Pulse Generator Implant Date: 20190821
Lead Channel Impedance Value: 512.5 Ohm
Lead Channel Pacing Threshold Amplitude: 0.75 V
Lead Channel Pacing Threshold Pulse Width: 0.5 ms
Lead Channel Pacing Threshold Pulse Width: 0.5 ms
Lead Channel Sensing Intrinsic Amplitude: 11.9 mV
Lead Channel Setting Pacing Amplitude: 3.5 V
Lead Channel Setting Sensing Sensitivity: 0.5 mV
MDC IDC LEAD IMPLANT DT: 20190821
MDC IDC MSMT LEADCHNL RV PACING THRESHOLD AMPLITUDE: 0.75 V
MDC IDC SESS DTM: 20190903172629
MDC IDC SET LEADCHNL RV PACING PULSEWIDTH: 0.5 ms
MDC IDC STAT BRADY RV PERCENT PACED: 0.11 %
Pulse Gen Serial Number: 9824800

## 2018-05-19 NOTE — Progress Notes (Signed)
Wound check appointment. Staples removed. Wound without redness or edema. Incision edges approximated, wound well healed. Normal device function. Thresholds, sensing, and impedances consistent with implant measurements. Device programmed at 3.5V for extra safety margin until 3 month visit. Histogram distribution appropriate for patient and level of activity. No ventricular arrhythmias noted. Patient educated about wound care, arm mobility, lifting restrictions, shock plan and Merlin monitoring. ROV with [facility] 08/12/18.

## 2018-05-25 ENCOUNTER — Telehealth (HOSPITAL_COMMUNITY): Payer: Self-pay

## 2018-05-25 NOTE — Telephone Encounter (Signed)
Would try Robitussin or tessalon perles.  Best medicine for heart in this class, would try to avoid stopping it.

## 2018-05-25 NOTE — Telephone Encounter (Signed)
Pt called to state that he believes the  entresto is causing him a dry cough. He is currently taking 1 (97-103) by mouth twice daily. Please advise.

## 2018-05-26 ENCOUNTER — Encounter (HOSPITAL_COMMUNITY): Payer: Self-pay | Admitting: *Deleted

## 2018-05-26 ENCOUNTER — Other Ambulatory Visit (HOSPITAL_COMMUNITY): Payer: Self-pay

## 2018-05-26 MED ORDER — BENZONATATE 100 MG PO CAPS: 100.0000 mg | ORAL_CAPSULE | Freq: Three times a day (TID) | ORAL | 0 refills | Status: DC | PRN

## 2018-05-26 NOTE — Telephone Encounter (Signed)
Left VM for pt to call clinic. 

## 2018-05-26 NOTE — Telephone Encounter (Signed)
Pt notified and Rx for tessalon pearles was sent to pharmacy.

## 2018-05-27 ENCOUNTER — Ambulatory Visit (INDEPENDENT_AMBULATORY_CARE_PROVIDER_SITE_OTHER): Payer: 59 | Admitting: Cardiovascular Disease

## 2018-05-27 ENCOUNTER — Encounter: Payer: Self-pay | Admitting: Cardiovascular Disease

## 2018-05-27 DIAGNOSIS — I5021 Acute systolic (congestive) heart failure: Secondary | ICD-10-CM

## 2018-05-27 DIAGNOSIS — E7800 Pure hypercholesterolemia, unspecified: Secondary | ICD-10-CM

## 2018-05-27 DIAGNOSIS — E785 Hyperlipidemia, unspecified: Secondary | ICD-10-CM | POA: Insufficient documentation

## 2018-05-27 NOTE — Assessment & Plan Note (Signed)
Nonischemic card myopathy with catheter revealed normal coronary arteries and severe LV dysfunction.  His EF currently is 15 and 20% with elevated PA pressures.  He is on optimal medical therapy.  He recently had an ICD placed by Dr. Royann Shivers for primary prevention.  The site is healing well.

## 2018-05-27 NOTE — Patient Instructions (Addendum)
Medication Instructions:  Your physician recommends that you continue on your current medications as directed. Please refer to the Current Medication list given to you today.   Labwork: Your physician recommends that you return for lab work in: FASTING    Testing/Procedures: NONE  Follow-Up: Your physician wants you to follow-up in: 6 MONTHS WITH DR. Allyson Sabal. You will receive a reminder letter in the mail two months in advance. If you don't receive a letter, please call our office to schedule the follow-up appointment.   Any Other Special Instructions Will Be Listed Below (If Applicable).     If you need a refill on your cardiac medications before your next appointment, please call your pharmacy.

## 2018-05-27 NOTE — Assessment & Plan Note (Signed)
History of hyperlipidemia on statin therapy. We will recheck a lipid and liver profile 

## 2018-05-27 NOTE — Assessment & Plan Note (Signed)
She of essential hypertension her blood pressure measured at 112/64.  He is on carvedilol and Entresto.

## 2018-05-27 NOTE — Progress Notes (Signed)
05/27/2018 John [name]   Mar 10, 1961  [id number]  Primary Physician Margaree Mackintosh, MD Primary Cardiologist: Runell Gess MD Nicholes Calamity, [place]  HPI:  John [name] is a 57 y.o.  divorced Caucasian male father of 3 children with no grandchildren is accompanied by his girlfriendTressawho is Health visitor care in [place]. He works in Airline [employer]. He relocated from [place] approximately 2 years ago.I last saw him in the office  in the office 03/06/2018.He has a history of treated hypertension and in the right indiscretion with regards to alcohol and salt. He presented with systolic heart failure in February of this year, was diuresed and underwent right left heart cath by myself using the femoral approach revealing normal coronary arteries, severe LV dysfunction with elevated filling pressures. His mean pulmonary capillary wedge pressure was 33 and his LVEDP was 41. He was placed on medications and discharged. 2-D echo performed in June showed improvement in LV function up to 40% although he continues to abuse salt.He continues to drink alcohol occasionally. Recent 2-D echo performed 08/27/17 revealed an EF of 35-40%.  Since I saw him 6 months ago he admits to dietary indiscretion.  He is been fairly sedentary.  He did break his shoulder and had pinning.  He said increasing shortness of breath and weight gain with lower extremity edema.  Recent 2D echo performed 02/26/2018 revealed a mildly dilated LV with an EF of 15 to 20%, grade 3 diastolic dysfunction and moderate pulmonary hypertension with a peak PA pressure of 53 mmHg.  This is considerably worse compared to his last echo performed in December of last year. He had an ICD placed recently by Dr. Royann Shivers for primary prevention.  He does admit to occasional dietary indiscretion but denies chest pain or shortness of breath.  Does have a morning cough and was prescribed Tessalon Perles by Dr. Shirlee Latch .  Current Meds    Medication Sig  . acetaminophen (TYLENOL) 325 MG tablet Take 650 mg by mouth every 6 (six) hours as needed for mild pain.  Marland Kitchen aspirin 81 MG chewable tablet Chew 1 tablet (81 mg total) by mouth daily.  . benzonatate (TESSALON PERLES) 100 MG capsule Take 1 capsule (100 mg total) by mouth 3 (three) times daily as needed for cough.  . carvedilol (COREG) 25 MG tablet Take 1 tablet (25 mg total) by mouth 2 (two) times daily with a meal.  . cetirizine (ZYRTEC) 10 MG chewable tablet Chew 10 mg by mouth daily.  . furosemide (LASIX) 80 MG tablet Take 1 tablet (80 mg total) by mouth every morning, then take 1/2 tablet (40mg  total) by mouth every evening.  Marland Kitchen LORazepam (ATIVAN) 1 MG tablet Take 0.5-1 mg by mouth every 8 (eight) hours as needed for anxiety.  . Multiple Vitamins-Minerals (MULTIVITAMIN WITH MINERALS) tablet Take 1 tablet by mouth daily.  . potassium chloride SA (K-DUR,KLOR-CON) 20 MEQ tablet Take 20 mEq by mouth daily.  . rosuvastatin (CRESTOR) 20 MG tablet Take 20 mg by mouth daily.  . sacubitril-valsartan (ENTRESTO) 97-103 MG Take 1 tablet by mouth 2 (two) times daily.  . sertraline (ZOLOFT) 25 MG tablet Take 1 tablet (25 mg total) by mouth daily.  . sildenafil (VIAGRA) 100 MG tablet Take 0.5 tablets (50 mg total) by mouth daily as needed for erectile dysfunction.  Marland Kitchen spironolactone (ALDACTONE) 25 MG tablet Take 1 tablet (25 mg total) by mouth daily.     No Known Allergies  Social History   Socioeconomic  History  . Marital status: Divorced    Spouse name: Not on file  . Number of children: Not on file  . Years of education: Not on file  . Highest education level: Not on file  Occupational History  . Not on file  Social Needs  . Financial resource strain: Not on file  . Food insecurity:    Worry: Not on file    Inability: Not on file  . Transportation needs:    Medical: Not on file    Non-medical: Not on file  Tobacco Use  . Smoking status: Never Smoker  . Smokeless tobacco:  Never Used  Substance and Sexual Activity  . Alcohol use: Yes    Alcohol/week: 3.0 standard drinks    Types: 3 Cans of beer per week    Comment: occasional  . Drug use: No  . Sexual activity: Not on file  Lifestyle  . Physical activity:    Days per week: Not on file    Minutes per session: Not on file  . Stress: Not on file  Relationships  . Social connections:    Talks on phone: Not on file    Gets together: Not on file    Attends religious service: Not on file    Active member of club or organization: Not on file    Attends meetings of clubs or organizations: Not on file    Relationship status: Not on file  . Intimate partner violence:    Fear of current or ex partner: Not on file    Emotionally abused: Not on file    Physically abused: Not on file    Forced sexual activity: Not on file  Other Topics Concern  . Not on file  Social History Narrative  . Not on file     Review of Systems: General: negative for chills, fever, night sweats or weight changes.  Cardiovascular: negative for chest pain, dyspnea on exertion, edema, orthopnea, palpitations, paroxysmal nocturnal dyspnea or shortness of breath Dermatological: negative for rash Respiratory: negative for cough or wheezing Urologic: negative for hematuria Abdominal: negative for nausea, vomiting, diarrhea, bright red blood per rectum, melena, or hematemesis Neurologic: negative for visual changes, syncope, or dizziness All other systems reviewed and are otherwise negative except as noted above.    Blood pressure 112/64, pulse 73, height 5\' 11"  (1.803 m), weight 244 lb 3.2 oz (110.8 kg).  General appearance: alert and no distress Neck: no adenopathy, no carotid bruit, no JVD, supple, symmetrical, trachea midline and thyroid not enlarged, symmetric, no tenderness/mass/nodules Lungs: clear to auscultation bilaterally Heart: regular rate and rhythm, S1, S2 normal, no murmur, click, rub or gallop Extremities:  extremities normal, atraumatic, no cyanosis or edema Pulses: 2+ and symmetric Skin: Skin color, texture, turgor normal. No rashes or lesions Neurologic: Alert and oriented X 3, normal strength and tone. Normal symmetric reflexes. Normal coordination and gait  EKG not performed today  ASSESSMENT AND PLAN:   Acute systolic congestive heart failure, NYHA class 4 (HCC) Nonischemic card myopathy with catheter revealed normal coronary arteries and severe LV dysfunction.  His EF currently is 15 and 20% with elevated PA pressures.  He is on optimal medical therapy.  He recently had an ICD placed by Dr. Royann Shivers for primary prevention.  The site is healing well.  Essential hypertension She of essential hypertension her blood pressure measured at 112/64.  He is on carvedilol and Entresto.  Hyperlipidemia History of hyperlipidemia on statin therapy.  We will recheck a  lipid and liver profile.      Runell Gess MD FACP,FACC,FAHA, [facility] 05/27/2018 4:43 PM

## 2018-05-28 ENCOUNTER — Telehealth (HOSPITAL_COMMUNITY): Payer: Self-pay

## 2018-05-28 ENCOUNTER — Telehealth (HOSPITAL_COMMUNITY): Payer: Self-pay | Admitting: *Deleted

## 2018-05-28 NOTE — Telephone Encounter (Signed)
Called patient in regards to Cardiac Rehab - Scheduled orientation on 07/21/18 at 1:30pm. Patient will attend the 6:45am exc class. Mailed packet.

## 2018-05-28 NOTE — Telephone Encounter (Signed)
-----   Message from Thurmon Fair, MD sent at 05/28/2018  5:09 PM EDT ----- Regarding: RE: Ok to participate in Cardiac Rehab No further restrictions after the first 2 weeks. Thanks for checking in MCr ----- Message ----- From: Chelsea Aus, RN Sent: 05/28/2018   4:51 PM EDT To: Thurmon Fair, MD Subject: Ok to participate in Cardiac Rehab             Dr. Royann Shivers,  The above pt referred to cardiac rehab by Dr. Shirlee Latch for systolic heart failure.  Noted in his history pt had an ICD placed on 05/06/18 by you.  Pt completed his wound check on 9/3 and was seen by Dr. Allyson Sabal on 9/11.  Pt is tentatively scheduled for orientation at the end of October.  At that point any restrictions of activity or limitations with lifting?  Thanks for your insight and we are looking forward to working with him again. Alanson Aly, BSN Cardiac and Emergency planning/management officer

## 2018-06-09 LAB — LIPID PANEL
CHOL/HDL RATIO: 2.1 ratio (ref 0.0–5.0)
Cholesterol, Total: 111 mg/dL (ref 100–199)
HDL: 53 mg/dL (ref >39)
LDL CALC: 45 mg/dL (ref 0–99)
TRIGLYCERIDES: 66 mg/dL (ref 0–149)
VLDL Cholesterol Cal: 13 mg/dL (ref 5–40)

## 2018-06-09 LAB — HEPATIC FUNCTION PANEL
ALK PHOS: 83 IU/L (ref 39–117)
ALT: 14 IU/L (ref 0–44)
AST: 15 IU/L (ref 0–40)
Albumin: 4.1 g/dL (ref 3.5–5.5)
BILIRUBIN TOTAL: 0.6 mg/dL (ref 0.0–1.2)
Bilirubin, Direct: 0.28 mg/dL (ref 0.00–0.40)
Total Protein: 6.4 g/dL (ref 6.0–8.5)

## 2018-06-10 ENCOUNTER — Other Ambulatory Visit (HOSPITAL_COMMUNITY): Payer: Self-pay | Admitting: Cardiology

## 2018-06-11 ENCOUNTER — Encounter (HOSPITAL_COMMUNITY): Payer: Self-pay | Admitting: Cardiology

## 2018-06-11 ENCOUNTER — Ambulatory Visit (HOSPITAL_COMMUNITY)
Admission: RE | Admit: 2018-06-11 | Discharge: 2018-06-11 | Disposition: A | Discharge status: Home / Self Care | Payer: 59 | Source: Ambulatory Visit | Attending: Cardiology | Admitting: Cardiology

## 2018-06-11 VITALS — BP 138/82 | HR 84 | Wt 240.8 lb

## 2018-06-11 DIAGNOSIS — F419 Anxiety disorder, unspecified: Secondary | ICD-10-CM | POA: Insufficient documentation

## 2018-06-11 DIAGNOSIS — G4733 Obstructive sleep apnea (adult) (pediatric): Secondary | ICD-10-CM | POA: Diagnosis not present

## 2018-06-11 DIAGNOSIS — I428 Other cardiomyopathies: Secondary | ICD-10-CM | POA: Insufficient documentation

## 2018-06-11 DIAGNOSIS — Z95810 Presence of automatic (implantable) cardiac defibrillator: Secondary | ICD-10-CM | POA: Insufficient documentation

## 2018-06-11 DIAGNOSIS — Z7982 Long term (current) use of aspirin: Secondary | ICD-10-CM | POA: Insufficient documentation

## 2018-06-11 DIAGNOSIS — Z79899 Other long term (current) drug therapy: Secondary | ICD-10-CM | POA: Diagnosis not present

## 2018-06-11 DIAGNOSIS — I5022 Chronic systolic (congestive) heart failure: Secondary | ICD-10-CM

## 2018-06-11 DIAGNOSIS — F329 Major depressive disorder, single episode, unspecified: Secondary | ICD-10-CM | POA: Diagnosis not present

## 2018-06-11 DIAGNOSIS — I5082 Biventricular heart failure: Secondary | ICD-10-CM | POA: Insufficient documentation

## 2018-06-11 LAB — BASIC METABOLIC PANEL
Anion gap: 12 (ref 5–15)
BUN: 9 mg/dL (ref 6–20)
CO2: 22 mmol/L (ref 22–32)
Calcium: 9.4 mg/dL (ref 8.9–10.3)
Chloride: 94 mmol/L — ABNORMAL LOW (ref 98–111)
Creatinine, Ser: 0.67 mg/dL (ref 0.61–1.24)
GFR calc Af Amer: >60 mL/min (ref >60)
Glucose, Bld: 109 mg/dL — ABNORMAL HIGH (ref 70–99)
Potassium: 5.2 mmol/L — ABNORMAL HIGH (ref 3.5–5.1)
SODIUM: 128 mmol/L — AB (ref 135–145)

## 2018-06-11 NOTE — Patient Instructions (Signed)
Labs today (will call for abnormal results, otherwise no news is good news)  Echocardiogram and Follow up in 3 months.  

## 2018-06-11 NOTE — Progress Notes (Signed)
PCP: Dr. Nolen Mu Cardiology: Dr. Allyson Sabal HF Cardiology: Dr. Shirlee Latch  57 y.o. with history of nonischemic cardiomyopathy and OSA was referred by Dr. Allyson Sabal for evaluation of CHF.  He was initially diagnosed in early 2/18.  He had URI symptoms then developed dyspnea and put on weight. Echo was done in 2/18, showing EF 20-25%.  LHC in 2/18 showed normal coronaries. He was started on treatment for cardiomyopathy, and EF rose to 40% by 6/18. However, most recent echo in 6/19 showed EF back down to 15-20% with severe RV dilation/dysfunction.  CPX was done in 7/19, showed moderate functional limitation though submaximal.  He had St Jude ICD placed in 8/19.   Patient returns for followup of CHF.  Weight is up about 5 lbs.  He has been eating more and drinking beer.  No dyspnea walking on flat ground.  Exercise tolerance seems improved.  No orthopnea/PND.  Notes dyspnea walking up a hill.  Using CPAP.    Labs (7/19): K 4.9, creatinine 0.91 Labs (8/19): Na 128, K 4.9, creatinine 0.72 Labs (9/19): LDL 45  PMH: 1. OSA: uses CPAP.  2. Depression 3. Chronic systolic CHF: Nonischemic cardiomyopathy.  Diagnosed around 2/18.  - LHC (2/18): Normal coronaries.  - Echo (2/18): EF 20-25% - Echo (6/18): EF 40% - Echo (12/18): EF 35-40% - Echo (6/19): LV mildly dilated, EF 15-20%, RV severely dilated with severely decreased systolic function.  - CPX (7/19): peak VO2 16, VE/VCO2 slope 37, RER 1.03 => submaximal but probably moderate functional limitation.  - Cardiac MRI (7/19): EF 29% with mild LV dilation, mildly decreased RV systolic function EF 40%, mid-wall LGE throughtout the septal wall and the inferor wall.  - St Jude ICD placed in 8/19.   SH: Originally from [place], divorced with 3 kids, works in sales/logistics, never smoked, moderate ETOH intake. No drugs. Lives in [place].   FH: Sister with congenital heart abnormality.  No other cardiac disease.   ROS: All systems reviewed and negative except as per  HPI.  Current Outpatient Medications  Medication Sig Dispense Refill  . aspirin 81 MG chewable tablet Chew 1 tablet (81 mg total) by mouth daily.    . carvedilol (COREG) 25 MG tablet Take 1 tablet (25 mg total) by mouth 2 (two) times daily with a meal. 60 tablet 6  . cetirizine (ZYRTEC) 10 MG chewable tablet Chew 10 mg by mouth daily.    . furosemide (LASIX) 80 MG tablet Take 1 tablet (80 mg total) by mouth every morning, then take 1/2 tablet (40mg  total) by mouth every evening. 45 tablet 3  . LORazepam (ATIVAN) 1 MG tablet Take 0.5-1 mg by mouth every 8 (eight) hours as needed for anxiety.    . Multiple Vitamins-Minerals (MULTIVITAMIN WITH MINERALS) tablet Take 1 tablet by mouth daily.    . potassium chloride SA (K-DUR,KLOR-CON) 20 MEQ tablet Take 20 mEq by mouth daily.    . rosuvastatin (CRESTOR) 20 MG tablet Take 20 mg by mouth daily.    . sacubitril-valsartan (ENTRESTO) 97-103 MG Take 1 tablet by mouth 2 (two) times daily. 60 tablet 5  . spironolactone (ALDACTONE) 25 MG tablet Take 1 tablet (25 mg total) by mouth daily. 90 tablet 3  . acetaminophen (TYLENOL) 325 MG tablet Take 650 mg by mouth every 6 (six) hours as needed for mild pain.    . benzonatate (TESSALON PERLES) 100 MG capsule Take 1 capsule (100 mg total) by mouth 3 (three) times daily as needed for cough. 60 capsule  0  . sertraline (ZOLOFT) 25 MG tablet Take 1 tablet (25 mg total) by mouth daily. 30 tablet 11  . sildenafil (VIAGRA) 100 MG tablet Take 0.5 tablets (50 mg total) by mouth daily as needed for erectile dysfunction. 10 tablet 0   No current facility-administered medications for this encounter.    BP 138/82   Pulse 84   Wt 109.2 kg (240 lb 12.8 oz)   SpO2 98%   BMI 33.58 kg/m  General: NAD Neck: Thick, no JVD, no thyromegaly or thyroid nodule.  Lungs: Clear to auscultation bilaterally with normal respiratory effort. CV: Nondisplaced PMI.  Heart regular S1/S2, no S3/S4, no murmur.  No peripheral edema.  No carotid  bruit.  Normal pedal pulses.  Abdomen: Soft, nontender, no hepatosplenomegaly, no distention.  Skin: Intact without lesions or rashes.  Neurologic: Alert and oriented x 3.  Psych: Normal affect. Extremities: No clubbing or cyanosis.  HEENT: Normal.   Assessment/Plan: 1. Chronic systolic CHF: Nonischemic cardiomyopathy with biventricular failure.  Cath in 2/18 with no coronary disease.  Echo (6/19) with EF 15-20%, severe RV dilation/dysfunction.  CPX 7/19 submaximal but probably moderate functional limitation.  Cardiac MRI in 7/19 showed EF 29%.  LGE pattern was concerning for prior viral myocarditis. Now with St Jude ICD.  Cause of CMP is uncertain, never a very heavy drinker and no drugs.  No family history.  Possible viral cardiomyopathy. Symptoms improved, NYHA class II. Not volume overloaded on exam.  - Continue Lasix 80 qam/40 qpm.  BMET today.  - Continue spironolactone 25 mg daily.   - Continue Entresto 97/103 bid and Coreg 25 mg bid.  - Cardiac rehab to start 11/1.  - Close followup, may need advanced therapies down the road. Repeat echo in 12/19 to reassess LV function.  2. OSA: Continue CPAP.  3. Anxiety: Taking sertraline 25 mg daily for generalized anxiety.   Marca Ancona 06/11/2018

## 2018-06-12 ENCOUNTER — Telehealth (HOSPITAL_COMMUNITY): Payer: Self-pay

## 2018-06-12 NOTE — Telephone Encounter (Signed)
PT called and appointment was made for next 06/18/18 to have BMET DONE

## 2018-06-14 ENCOUNTER — Other Ambulatory Visit (HOSPITAL_COMMUNITY): Payer: Self-pay | Admitting: Cardiology

## 2018-06-15 ENCOUNTER — Telehealth (HOSPITAL_COMMUNITY): Payer: Self-pay | Admitting: *Deleted

## 2018-06-15 MED ORDER — SACUBITRIL-VALSARTAN 49-51 MG PO TABS: 1.0000 | ORAL_TABLET | Freq: Two times a day (BID) | ORAL | 3 refills | Status: DC

## 2018-06-15 NOTE — Telephone Encounter (Signed)
Pt called to report bad headache and vomiting since increase in Entresto. PT advised to decrease Entresto to 49/51mg  bid. Per Dr.McLean pt aware and agreeable with plan.

## 2018-06-16 ENCOUNTER — Other Ambulatory Visit (HOSPITAL_COMMUNITY): Payer: Self-pay | Admitting: Cardiology

## 2018-06-17 ENCOUNTER — Encounter (HOSPITAL_COMMUNITY): Payer: Self-pay | Admitting: *Deleted

## 2018-06-17 ENCOUNTER — Encounter: Payer: Self-pay | Admitting: *Deleted

## 2018-06-17 ENCOUNTER — Ambulatory Visit (HOSPITAL_COMMUNITY)
Admission: RE | Admit: 2018-06-17 | Discharge: 2018-06-17 | Disposition: A | Discharge status: Home / Self Care | Payer: 59 | Source: Ambulatory Visit | Attending: Internal Medicine | Admitting: Internal Medicine

## 2018-06-17 DIAGNOSIS — I429 Cardiomyopathy, unspecified: Secondary | ICD-10-CM | POA: Insufficient documentation

## 2018-06-17 LAB — BASIC METABOLIC PANEL
Anion gap: 10 (ref 5–15)
BUN: 21 mg/dL — AB (ref 6–20)
CALCIUM: 8.9 mg/dL (ref 8.9–10.3)
CHLORIDE: 100 mmol/L (ref 98–111)
CO2: 22 mmol/L (ref 22–32)
CREATININE: 0.95 mg/dL (ref 0.61–1.24)
GFR calc Af Amer: >60 mL/min (ref >60)
GFR calc non Af Amer: >60 mL/min (ref >60)
Glucose, Bld: 153 mg/dL — ABNORMAL HIGH (ref 70–99)
Potassium: 4.3 mmol/L (ref 3.5–5.1)
SODIUM: 132 mmol/L — AB (ref 135–145)

## 2018-06-17 NOTE — Progress Notes (Signed)
May reu

## 2018-06-18 ENCOUNTER — Other Ambulatory Visit (HOSPITAL_COMMUNITY): Payer: Self-pay

## 2018-07-20 ENCOUNTER — Telehealth (HOSPITAL_COMMUNITY): Payer: Self-pay

## 2018-07-20 NOTE — Progress Notes (Signed)
John [name] 57 y.o. male DOB 27-Sep-1960 MRN [id number]       Nutrition  No diagnosis found. Past Medical History:  Diagnosis Date  . Anxiety    Ativan  . Chronic combined systolic and diastolic CHF, NYHA class 3 (HCC) 10/2016   Nonischemic cardiomyopathy. EF 20-25%.  . Chronic left hip pain   . Depression    history of  . GI bleed 03/2016  . Headache   . Hypertensive heart disease with combined systolic and diastolic congestive heart failure (HCC) 10/2016  . Nonischemic cardiomyopathy (HCC) 10/2016   Echo with EF 20-25%. Cardiac catheterization with no CAD. LVEDP was 41 mmHg, PCWP 36 mmHg  . Osteoarthritis    right shoulder  . Right hand fracture   . Seasonal allergies   . Sleep apnea    wears a CPAP  . Wears glasses    Meds reviewed.     Current Outpatient Medications (Cardiovascular):  .  carvedilol (COREG) 25 MG tablet, Take 1 tablet (25 mg total) by mouth 2 (two) times daily with a meal. .  furosemide (LASIX) 80 MG tablet, Take 1 tablet (80 mg total) by mouth every morning, then take 1/2 tablet (40mg  total) by mouth every evening. .  rosuvastatin (CRESTOR) 20 MG tablet, Take 20 mg by mouth daily. .  sacubitril-valsartan (ENTRESTO) 49-51 MG, Take 1 tablet by mouth 2 (two) times daily. .  sildenafil (VIAGRA) 100 MG tablet, TAKE 0.5 TABLETS (50 MG TOTAL) BY MOUTH DAILY AS NEEDED FOR ERECTILE DYSFUNCTION. Marland Kitchen  spironolactone (ALDACTONE) 25 MG tablet, Take 1 tablet (25 mg total) by mouth daily.  Current Outpatient Medications (Respiratory):  .  benzonatate (TESSALON PERLES) 100 MG capsule, Take 1 capsule (100 mg total) by mouth 3 (three) times daily as needed for cough. .  cetirizine (ZYRTEC) 10 MG chewable tablet, Chew 10 mg by mouth daily.  Current Outpatient Medications (Analgesics):  .  acetaminophen (TYLENOL) 325 MG tablet, Take 650 mg by mouth every 6 (six) hours as needed for mild pain. Marland Kitchen  aspirin 81 MG chewable tablet, Chew 1 tablet (81 mg total) by mouth  daily.   Current Outpatient Medications (Other):  Marland Kitchen  LORazepam (ATIVAN) 1 MG tablet, Take 0.5-1 mg by mouth every 8 (eight) hours as needed for anxiety. .  Multiple Vitamins-Minerals (MULTIVITAMIN WITH MINERALS) tablet, Take 1 tablet by mouth daily. .  potassium chloride SA (K-DUR,KLOR-CON) 20 MEQ tablet, Take 20 mEq by mouth daily. .  sertraline (ZOLOFT) 25 MG tablet, Take 1 tablet (25 mg total) by mouth daily.   HT: Ht Readings from Last 1 Encounters:  05/27/18 5\' 11"  (1.803 m)    WT: Wt Readings from Last 5 Encounters:  06/11/18 240 lb 12.8 oz (109.2 kg)  05/27/18 244 lb 3.2 oz (110.8 kg)  05/07/18 232 lb 12.9 oz (105.6 kg)  04/29/18 233 lb 9.6 oz (106 kg)  04/14/18 235 lb 3.2 oz (106.7 kg)     BMI = 34.07 (05/27/18)  Current tobacco use? No       Labs:  Lipid Panel     Component Value Date/Time   CHOL 111 06/09/2018 1137   TRIG 66 06/09/2018 1137   HDL 53 06/09/2018 1137   CHOLHDL 2.1 06/09/2018 1137   LDLCALC 45 06/09/2018 1137    No results found for: HGBA1C CBG (last 3)  No results for input(s): GLUCAP in the last 72 hours.  Nutrition Diagnosis ? Food-and nutrition-related knowledge deficit related to lack of exposure to information as related  to diagnosis of: ? CVD  ? Obese  I = 30-34.9 related to excessive energy intake as evidenced by a BMI 34.07  Nutrition Goal(s):  ? To be determined  Plan:  Pt to attend nutrition classes ? Nutrition I ? Nutrition II ? Portion Distortion  Will provide client-centered nutrition education as part of interdisciplinary care.   Monitor and evaluate progress toward nutrition goal with team.  Ross Marcus, MS, RD, LDN 07/20/2018 10:44 AM

## 2018-07-21 ENCOUNTER — Encounter (HOSPITAL_COMMUNITY)
Admission: RE | Admit: 2018-07-21 | Discharge: 2018-07-21 | Disposition: A | Discharge status: Home / Self Care | Payer: 59 | Source: Ambulatory Visit | Attending: Cardiology | Admitting: Cardiology

## 2018-07-21 ENCOUNTER — Encounter (HOSPITAL_COMMUNITY): Payer: Self-pay

## 2018-07-21 VITALS — Ht 69.5 in | Wt 231.9 lb

## 2018-07-21 DIAGNOSIS — I5042 Chronic combined systolic (congestive) and diastolic (congestive) heart failure: Secondary | ICD-10-CM | POA: Insufficient documentation

## 2018-07-21 DIAGNOSIS — Z79899 Other long term (current) drug therapy: Secondary | ICD-10-CM | POA: Insufficient documentation

## 2018-07-21 DIAGNOSIS — F419 Anxiety disorder, unspecified: Secondary | ICD-10-CM | POA: Insufficient documentation

## 2018-07-21 DIAGNOSIS — Z713 Dietary counseling and surveillance: Secondary | ICD-10-CM | POA: Insufficient documentation

## 2018-07-21 DIAGNOSIS — I428 Other cardiomyopathies: Secondary | ICD-10-CM | POA: Diagnosis present

## 2018-07-21 DIAGNOSIS — I5022 Chronic systolic (congestive) heart failure: Secondary | ICD-10-CM

## 2018-07-21 DIAGNOSIS — I110 Hypertensive heart disease with heart failure: Secondary | ICD-10-CM | POA: Diagnosis not present

## 2018-07-21 NOTE — Progress Notes (Signed)
Cardiac Individual Treatment Plan  Patient Details  Name: John [name] MRN: [id number] Date of Birth: 20-Aug-1961 Referring Provider:   Flowsheet Row CARDIAC REHAB PHASE II ORIENTATION from 07/21/2018 in [facility] CARDIAC REHAB  Referring Provider  Laurey Morale MD       Initial Encounter Date:  Flowsheet Row CARDIAC REHAB PHASE II ORIENTATION from 07/21/2018 in [facility] CARDIAC REHAB  Date  07/21/18      Visit Diagnosis: 04/2018 Heart failure, chronic systolic (HCC)  Patient's Home Medications on Admission:  Current Outpatient Medications:  .  acetaminophen (TYLENOL) 325 MG tablet, Take 650 mg by mouth every 6 (six) hours as needed for mild pain., Disp: , Rfl:  .  aspirin 81 MG chewable tablet, Chew 1 tablet (81 mg total) by mouth daily., Disp: , Rfl:  .  benzonatate (TESSALON PERLES) 100 MG capsule, Take 1 capsule (100 mg total) by mouth 3 (three) times daily as needed for cough., Disp: 60 capsule, Rfl: 0 .  carvedilol (COREG) 25 MG tablet, Take 1 tablet (25 mg total) by mouth 2 (two) times daily with a meal., Disp: 60 tablet, Rfl: 6 .  cetirizine (ZYRTEC) 10 MG chewable tablet, Chew 10 mg by mouth daily., Disp: , Rfl:  .  furosemide (LASIX) 80 MG tablet, Take 1 tablet (80 mg total) by mouth every morning, then take 1/2 tablet (40mg  total) by mouth every evening., Disp: 45 tablet, Rfl: 3 .  LORazepam (ATIVAN) 1 MG tablet, Take 0.5-1 mg by mouth every 8 (eight) hours as needed for anxiety., Disp: , Rfl:  .  Multiple Vitamins-Minerals (MULTIVITAMIN WITH MINERALS) tablet, Take 1 tablet by mouth daily., Disp: , Rfl:  .  potassium chloride SA (K-DUR,KLOR-CON) 20 MEQ tablet, Take 20 mEq by mouth daily., Disp: , Rfl:  .  rosuvastatin (CRESTOR) 20 MG tablet, Take 20 mg by mouth daily., Disp: , Rfl:  .  sacubitril-valsartan (ENTRESTO) 49-51 MG, Take 1 tablet by mouth 2 (two) times daily., Disp: 60 tablet, Rfl: 3 .  sertraline (ZOLOFT) 25 MG tablet,  Take 1 tablet (25 mg total) by mouth daily., Disp: 30 tablet, Rfl: 11 .  sildenafil (VIAGRA) 100 MG tablet, TAKE 0.5 TABLETS (50 MG TOTAL) BY MOUTH DAILY AS NEEDED FOR ERECTILE DYSFUNCTION., Disp: 6 tablet, Rfl: 1 .  spironolactone (ALDACTONE) 25 MG tablet, Take 1 tablet (25 mg total) by mouth daily., Disp: 90 tablet, Rfl: 3  Past Medical History: Past Medical History:  Diagnosis Date  . Anxiety    Ativan  . Chronic combined systolic and diastolic CHF, NYHA class 3 (HCC) 10/2016   Nonischemic cardiomyopathy. EF 20-25%.  . Chronic left hip pain   . Depression    history of  . GI bleed 03/2016  . Headache   . Hypertensive heart disease with combined systolic and diastolic congestive heart failure (HCC) 10/2016  . Nonischemic cardiomyopathy (HCC) 10/2016   Echo with EF 20-25%. Cardiac catheterization with no CAD. LVEDP was 41 mmHg, PCWP 36 mmHg  . Osteoarthritis    right shoulder  . Right hand fracture   . Seasonal allergies   . Sleep apnea    wears a CPAP  . Wears glasses     Tobacco Use: Social History   Tobacco Use  Smoking Status Never Smoker  Smokeless Tobacco Never Used    Labs: Recent Review Advice worker    Labs for ITP Cardiac and Pulmonary Rehab Latest Ref Rng & Units 10/21/2016 10/21/2016 06/09/2018   Cholestrol  100 - 199 mg/dL - - 161   LDLCALC 0 - 99 mg/dL - - 45   HDL >09 mg/dL - - 53   Trlycerides 0 - 149 mg/dL - - 66   PHART 6.045 - 7.450 - 7.427 -   PCO2ART 32.0 - 48.0 mmHg - 41.7 -   HCO3 20.0 - 28.0 mmol/L 29.3(H) 27.5 -   TCO2 0 - 100 mmol/L 31 29 -   O2SAT % 61.0 99.0 -      Capillary Blood Glucose: No results found for: GLUCAP   Exercise Target Goals: Exercise Program Goal: Individual exercise prescription set using results from initial 6 min walk test and THRR while considering  patient's activity barriers and safety.   Exercise Prescription Goal: Initial exercise prescription builds to 30-45 minutes a day of aerobic activity, 2-3 days per  week.  Home exercise guidelines will be given to patient during program as part of exercise prescription that the participant will acknowledge.  Activity Barriers & Risk Stratification: Activity Barriers & Cardiac Risk Stratification - 07/21/18 1422    Activity Barriers & Cardiac Risk Stratification          Activity Barriers  Left Hip Replacement;Other (comment);Joint Problems    Comments  Bilateral shoulder replacement    Cardiac Risk Stratification  High           6 Minute Walk: 6 Minute Walk    6 Minute Walk    Row Name 07/21/18 1410   Phase  Initial   Distance  2025 feet   Walk Time  6 minutes   # of Rest Breaks  0   MPH  3.84   METS  4.11   RPE  12   Perceived Dyspnea   0   VO2 Peak  14.4   Symptoms  No   Resting HR  68 bpm   Resting BP  128/70   Resting Oxygen Saturation   97 %   Exercise Oxygen Saturation  during 6 min walk  96 %   Max Ex. HR  104 bpm   Max Ex. BP  132/72   2 Minute Post BP  120/70          Oxygen Initial Assessment:   Oxygen Re-Evaluation:   Oxygen Discharge (Final Oxygen Re-Evaluation):   Initial Exercise Prescription: Initial Exercise Prescription - 07/21/18 1400    Date of Initial Exercise RX and Referring Provider          Date  07/21/18    Referring Provider  Laurey Morale MD     Expected Discharge Date  10/26/18        Bike          Level  1.5    Minutes  10    METs  3.7        NuStep          Level  2    SPM  75    Minutes  10    METs  3.5        Track          Laps  16    Minutes  10    METs  3.76        Prescription Details          Frequency (times per week)  3x    Duration  Progress to 30 minutes of continuous aerobic without signs/symptoms of physical distress        Intensity  THRR 40-80% of Max Heartrate  65-131    Ratings of Perceived Exertion  11-13    Perceived Dyspnea  0-4        Progression          Progression  Continue progressive overload as per policy without  signs/symptoms or physical distress.        Resistance Training          Training Prescription  Yes    Weight  3lbs    Reps  10-15           Perform Capillary Blood Glucose checks as needed.  Exercise Prescription Changes:   Exercise Comments:   Exercise Goals and Review: Exercise Goals    Exercise Goals    Row Name 07/21/18 1424   Increase Physical Activity  Yes   Intervention  Provide advice, education, support and counseling about physical activity/exercise needs.;Develop an individualized exercise prescription for aerobic and resistive training based on initial evaluation findings, risk stratification, comorbidities and participant's personal goals.   Expected Outcomes  Short Term: Attend rehab on a regular basis to increase amount of physical activity.;Long Term: Exercising regularly at least 3-5 days a week.;Long Term: Add in home exercise to make exercise part of routine and to increase amount of physical activity.   Increase Strength and Stamina  Yes   Intervention  Provide advice, education, support and counseling about physical activity/exercise needs.;Develop an individualized exercise prescription for aerobic and resistive training based on initial evaluation findings, risk stratification, comorbidities and participant's personal goals.   Expected Outcomes  Short Term: Increase workloads from initial exercise prescription for resistance, speed, and METs.;Short Term: Perform resistance training exercises routinely during rehab and add in resistance training at home;Long Term: Improve cardiorespiratory fitness, muscular endurance and strength as measured by increased METs and functional capacity ( )   Able to understand and use rate of perceived exertion (RPE) scale  Yes   Intervention  Provide education and explanation on how to use RPE scale   Expected Outcomes  Short Term: Able to use RPE daily in rehab to express subjective intensity level;Long Term:  Able to use RPE  to guide intensity level when exercising independently   Knowledge and understanding of Target Heart Rate Range (THRR)  Yes   Intervention  Provide education and explanation of THRR including how the numbers were predicted and where they are located for reference   Expected Outcomes  Short Term: Able to state/look up THRR;Long Term: Able to use THRR to govern intensity when exercising independently;Short Term: Able to use daily as guideline for intensity in rehab   Able to check pulse independently  Yes   Intervention  Provide education and demonstration on how to check pulse in carotid and radial arteries.;Review the importance of being able to check your own pulse for safety during independent exercise   Expected Outcomes  Short Term: Able to explain why pulse checking is important during independent exercise;Long Term: Able to check pulse independently and accurately   Understanding of Exercise Prescription  Yes   Intervention  Provide education, explanation, and written materials on patient's individual exercise prescription   Expected Outcomes  Short Term: Able to explain program exercise prescription;Long Term: Able to explain home exercise prescription to exercise independently          Exercise Goals Re-Evaluation :   Discharge Exercise Prescription (Final Exercise Prescription Changes):   Nutrition:  Target Goals: Understanding of nutrition guidelines, daily intake of sodium 1500mg , cholesterol 200mg , calories  30% from fat and 7% or less from saturated fats, daily to have 5 or more servings of fruits and vegetables.  Biometrics: Pre Biometrics - 07/21/18 1408    Pre Biometrics          Height  5' 9.5" (1.765 m)    Weight  105.2 kg    Waist Circumference  49 inches    Hip Circumference  46 inches    Waist to Hip Ratio  1.07 %    BMI (Calculated)  33.77    Triceps Skinfold  23 mm    % Body Fat  35.3 %    Grip Strength  33 kg    Flexibility  0 in    Single Leg Stand  30  seconds            Nutrition Therapy Plan and Nutrition Goals:   Nutrition Assessments:   Nutrition Goals Re-Evaluation:   Nutrition Goals Re-Evaluation:   Nutrition Goals Discharge (Final Nutrition Goals Re-Evaluation):   Psychosocial: Target Goals: Acknowledge presence or absence of significant depression and/or stress, maximize coping skills, provide positive support system. Participant is able to verbalize types and ability to use techniques and skills needed for reducing stress and depression.  Initial Review & Psychosocial Screening: Initial Psych Review & Screening - 07/21/18 1453    Initial Review          Current issues with  History of Depression;Current Stress Concerns    Source of Stress Concerns  Chronic Illness;Occupation;Financial;Poor Coping Skills        Family Dynamics          Good Support System?  Yes   family, girlfriend        Barriers          Psychosocial barriers to participate in program  The patient should benefit from training in stress management and relaxation.        Screening Interventions          Interventions  Encouraged to exercise;To provide support and resources with identified psychosocial needs;Provide feedback about the scores to participant    Expected Outcomes  Short Term goal: Identification and review with participant of any Quality of Life or Depression concerns found by scoring the questionnaire.;Long Term goal: The participant improves quality of Life and PHQ9 Scores as seen by post scores and/or verbalization of changes           Quality of Life Scores: Quality of Life - 07/21/18 1355    Quality of Life          Select  Quality of Life        Quality of Life Scores          Health/Function Pre  17 %    Socioeconomic Pre  23.07 %    Psych/Spiritual Pre  17.14 %    Family Pre  27.6 %    GLOBAL Pre  19.84 %          Scores of 19 and below usually indicate a poorer quality of life in these areas.  A  difference of  2-3 points is a clinically meaningful difference.  A difference of 2-3 points in the total score of the Quality of Life Index has been associated with significant improvement in overall quality of life, self-image, physical symptoms, and general health in studies assessing change in quality of life.  PHQ-9: Recent Review Flowsheet Data    Depression screen [facility] 2/9 02/04/2018 06/23/2017   Decreased Interest 0  0   Down, Depressed, Hopeless 0 0   PHQ - 2 Score 0 0     Interpretation of Total Score  Total Score Depression Severity:  1-4 = Minimal depression, 5-9 = Mild depression, 10-14 = Moderate depression, 15-19 = Moderately severe depression, 20-27 = Severe depression   Psychosocial Evaluation and Intervention:   Psychosocial Re-Evaluation:   Psychosocial Discharge (Final Psychosocial Re-Evaluation):   Vocational Rehabilitation: Provide vocational rehab assistance to qualifying candidates.   Vocational Rehab Evaluation & Intervention: Vocational Rehab - 07/21/18 1456    Initial Vocational Rehab Evaluation & Intervention          Assessment shows need for Vocational Rehabilitation  Yes   frequent travel          Education: Education Goals: Education classes will be provided on a weekly basis, covering required topics. Participant will state understanding/return demonstration of topics presented.  Learning Barriers/Preferences: Learning Barriers/Preferences - 07/21/18 1356    Learning Barriers/Preferences          Learning Barriers  Sight    Learning Preferences  Skilled Demonstration;Verbal Instruction;Video;Audio           Education Topics: Count Your Pulse:  -Group instruction provided by verbal instruction, demonstration, patient participation and written materials to support subject.  Instructors address importance of being able to find your pulse and how to count your pulse when at home without a heart monitor.  Patients get hands on experience  counting their pulse with staff help and individually.   Heart Attack, Angina, and Risk Factor Modification:  -Group instruction provided by verbal instruction, video, and written materials to support subject.  Instructors address signs and symptoms of angina and heart attacks.    Also discuss risk factors for heart disease and how to make changes to improve heart health risk factors.   Functional Fitness:  -Group instruction provided by verbal instruction, demonstration, patient participation, and written materials to support subject.  Instructors address safety measures for doing things around the house.  Discuss how to get up and down off the floor, how to pick things up properly, how to safely get out of a chair without assistance, and balance training.   Meditation and Mindfulness:  -Group instruction provided by verbal instruction, patient participation, and written materials to support subject.  Instructor addresses importance of mindfulness and meditation practice to help reduce stress and improve awareness.  Instructor also leads participants through a meditation exercise.    Stretching for Flexibility and Mobility:  -Group instruction provided by verbal instruction, patient participation, and written materials to support subject.  Instructors lead participants through series of stretches that are designed to increase flexibility thus improving mobility.  These stretches are additional exercise for major muscle groups that are typically performed during regular warm up and cool down.   Hands Only CPR:  -Group verbal, video, and participation provides a basic overview of AHA guidelines for community CPR. Role-play of emergencies allow participants the opportunity to practice calling for help and chest compression technique with discussion of AED use.   Hypertension: -Group verbal and written instruction that provides a basic overview of hypertension including the most recent  diagnostic guidelines, risk factor reduction with self-care instructions and medication management.    Nutrition I class: Heart Healthy Eating:  -Group instruction provided by PowerPoint slides, verbal discussion, and written materials to support subject matter. The instructor gives an explanation and review of the Therapeutic Lifestyle Changes diet recommendations, which includes a discussion on lipid goals, dietary  fat, sodium, fiber, plant stanol/sterol esters, sugar, and the components of a well-balanced, healthy diet.   Nutrition II class: Lifestyle Skills:  -Group instruction provided by PowerPoint slides, verbal discussion, and written materials to support subject matter. The instructor gives an explanation and review of label reading, grocery shopping for heart health, heart healthy recipe modifications, and ways to make healthier choices when eating out.   Diabetes Question & Answer:  -Group instruction provided by PowerPoint slides, verbal discussion, and written materials to support subject matter. The instructor gives an explanation and review of diabetes co-morbidities, pre- and post-prandial blood glucose goals, pre-exercise blood glucose goals, signs, symptoms, and treatment of hypoglycemia and hyperglycemia, and foot care basics.   Diabetes Blitz:  -Group instruction provided by PowerPoint slides, verbal discussion, and written materials to support subject matter. The instructor gives an explanation and review of the physiology behind type 1 and type 2 diabetes, diabetes medications and rational behind using different medications, pre- and post-prandial blood glucose recommendations and Hemoglobin A1c goals, diabetes diet, and exercise including blood glucose guidelines for exercising safely.    Portion Distortion:  -Group instruction provided by PowerPoint slides, verbal discussion, written materials, and food models to support subject matter. The instructor gives an explanation  of serving size versus portion size, changes in portions sizes over the last 20 years, and what consists of a serving from each food group.   Stress Management:  -Group instruction provided by verbal instruction, video, and written materials to support subject matter.  Instructors review role of stress in heart disease and how to cope with stress positively.     Exercising on Your Own:  -Group instruction provided by verbal instruction, power point, and written materials to support subject.  Instructors discuss benefits of exercise, components of exercise, frequency and intensity of exercise, and end points for exercise.  Also discuss use of nitroglycerin and activating EMS.  Review options of places to exercise outside of rehab.  Review guidelines for sex with heart disease.   Cardiac Drugs I:  -Group instruction provided by verbal instruction and written materials to support subject.  Instructor reviews cardiac drug classes: antiplatelets, anticoagulants, beta blockers, and statins.  Instructor discusses reasons, side effects, and lifestyle considerations for each drug class.   Cardiac Drugs II:  -Group instruction provided by verbal instruction and written materials to support subject.  Instructor reviews cardiac drug classes: angiotensin converting enzyme inhibitors (ACE-I), angiotensin II receptor blockers (ARBs), nitrates, and calcium channel blockers.  Instructor discusses reasons, side effects, and lifestyle considerations for each drug class.   Anatomy and Physiology of the Circulatory System:  Group verbal and written instruction and models provide basic cardiac anatomy and physiology, with the coronary electrical and arterial systems. Review of: AMI, Angina, Valve disease, Heart Failure, Peripheral Artery Disease, Cardiac Arrhythmia, Pacemakers, and the ICD.   Other Education:  -Group or individual verbal, written, or video instructions that support the educational goals of the  cardiac rehab program.   Holiday Eating Survival Tips:  -Group instruction provided by PowerPoint slides, verbal discussion, and written materials to support subject matter. The instructor gives patients tips, tricks, and techniques to help them not only survive but enjoy the holidays despite the onslaught of food that accompanies the holidays.   Knowledge Questionnaire Score: Knowledge Questionnaire Score - 07/21/18 1355    Knowledge Questionnaire Score          Pre Score  23/24           Core  Components/Risk Factors/Patient Goals at Admission: Personal Goals and Risk Factors at Admission - 07/21/18 1400    Core Components/Risk Factors/Patient Goals on Admission          Admit Weight  231 lb 14.8 oz (105.2 kg)    Goal Weight: Long Term  221 lb (100.2 kg)           Core Components/Risk Factors/Patient Goals Review:    Core Components/Risk Factors/Patient Goals at Discharge (Final Review):    ITP Comments: ITP Comments    Row Name 07/21/18 1335   ITP Comments  Dr. Armanda Magic, Medical Director       Comments: Patient attended orientation from 1315 to 1415  to review rules and guidelines for program and nutrition consult from 1315-1330.  Completed 6 minute walk test, Intitial ITP, and exercise prescription.  VSS. Telemetry-sinus rhythm,  Asymptomatic. Pt interested in vocational rehab. Packet will be given at next scheduled appointment. Pt eager to complete appointment due to work conference call.    Deveron Furlong, RN, BSN Cardiac Pulmonary Rehab 07/21/18 3:01 PM

## 2018-07-21 NOTE — Progress Notes (Addendum)
John [name] 57 y.o. male DOB: 11-05-1960 MRN: [id number]      Nutrition Note  1. 04/2018 Heart failure, chronic systolic (HCC)    Past Medical History:  Diagnosis Date  . Anxiety    Ativan  . Chronic combined systolic and diastolic CHF, NYHA class 3 (HCC) 10/2016   Nonischemic cardiomyopathy. EF 20-25%.  . Chronic left hip pain   . Depression    history of  . GI bleed 03/2016  . Headache   . Hypertensive heart disease with combined systolic and diastolic congestive heart failure (HCC) 10/2016  . Nonischemic cardiomyopathy (HCC) 10/2016   Echo with EF 20-25%. Cardiac catheterization with no CAD. LVEDP was 41 mmHg, PCWP 36 mmHg  . Osteoarthritis    right shoulder  . Right hand fracture   . Seasonal allergies   . Sleep apnea    wears a CPAP  . Wears glasses    Meds reviewed.    Current Outpatient Medications (Cardiovascular):  .  carvedilol (COREG) 25 MG tablet, Take 1 tablet (25 mg total) by mouth 2 (two) times daily with a meal. .  furosemide (LASIX) 80 MG tablet, Take 1 tablet (80 mg total) by mouth every morning, then take 1/2 tablet (40mg  total) by mouth every evening. .  rosuvastatin (CRESTOR) 20 MG tablet, Take 20 mg by mouth daily. .  sacubitril-valsartan (ENTRESTO) 49-51 MG, Take 1 tablet by mouth 2 (two) times daily. .  sildenafil (VIAGRA) 100 MG tablet, TAKE 0.5 TABLETS (50 MG TOTAL) BY MOUTH DAILY AS NEEDED FOR ERECTILE DYSFUNCTION. Marland Kitchen  spironolactone (ALDACTONE) 25 MG tablet, Take 1 tablet (25 mg total) by mouth daily.  Current Outpatient Medications (Respiratory):  .  benzonatate (TESSALON PERLES) 100 MG capsule, Take 1 capsule (100 mg total) by mouth 3 (three) times daily as needed for cough. .  cetirizine (ZYRTEC) 10 MG chewable tablet, Chew 10 mg by mouth daily.  Current Outpatient Medications (Analgesics):  .  acetaminophen (TYLENOL) 325 MG tablet, Take 650 mg by mouth every 6 (six) hours as needed for mild pain. Marland Kitchen  aspirin 81 MG chewable tablet, Chew 1  tablet (81 mg total) by mouth daily.   Current Outpatient Medications (Other):  Marland Kitchen  LORazepam (ATIVAN) 1 MG tablet, Take 0.5-1 mg by mouth every 8 (eight) hours as needed for anxiety. .  Multiple Vitamins-Minerals (MULTIVITAMIN WITH MINERALS) tablet, Take 1 tablet by mouth daily. .  potassium chloride SA (K-DUR,KLOR-CON) 20 MEQ tablet, Take 20 mEq by mouth daily. .  sertraline (ZOLOFT) 25 MG tablet, Take 1 tablet (25 mg total) by mouth daily.   HT: Ht Readings from Last 1 Encounters:  07/21/18 5' 9.5" (1.765 m)    WT: Wt Readings from Last 5 Encounters:  07/21/18 231 lb 14.8 oz (105.2 kg)  06/11/18 240 lb 12.8 oz (109.2 kg)  05/27/18 244 lb 3.2 oz (110.8 kg)  05/07/18 232 lb 12.9 oz (105.6 kg)  04/29/18 233 lb 9.6 oz (106 kg)     Body mass index is 33.76 kg/m.   Current tobacco use? No  Labs:  Lipid Panel     Component Value Date/Time   CHOL 111 06/09/2018 1137   TRIG 66 06/09/2018 1137   HDL 53 06/09/2018 1137   CHOLHDL 2.1 06/09/2018 1137   LDLCALC 45 06/09/2018 1137    No results found for: HGBA1C CBG (last 3)  No results for input(s): GLUCAP in the last 72 hours.  Nutrition Note Spoke with pt. Nutrition plan and goals reviewed with pt.  Pt is following Step 2 of the Therapeutic Lifestyle Changes diet. Pt wants to lose wt. Pt has been trying to lose wt by eating smaller portions. Wt loss tips reviewed (label reading, how to build a healthy plate, portion sizes, eating frequently across the day).Per discussion, pt does use canned/convenience foods often. Pt does not add salt to food. Pt does eat out frequently. Pt shared he has participated in cardiac rehab before. Pt shared that he found. Pt expressed understanding of the information reviewed. Pt aware of nutrition education classes offered and would like to attend nutrition classes.  Nutrition Diagnosis ? Food-and nutrition-related knowledge deficit related to lack of exposure to information as related to diagnosis of:  ? CVD ? Obese  I = 30-34.9 related to excessive energy intake as evidenced by a Body mass index is 33.76 kg/m.  Nutrition Intervention ? Pt's individual nutrition plan and goals reviewed with pt.  Nutrition Goal(s):  ? Pt to identify and limit food sources of saturated fat, trans fat, refined carbohydrates and sodium ? Pt to identify food quantities necessary to achieve weight loss of 6-24 lbs. at graduation from cardiac rehab. Goal wt of 20 lb desired.  ? Pt to eat a variety of non-starchy vegetables. ? Pt to pick healthier lunch foods. ? Pt to make good choices when eating out at restaurants.  Plan:  ? Pt to attend nutrition classes ? Nutrition I ? Nutrition II ? Portion Distortion  ? Will provide client-centered nutrition education as part of interdisciplinary care ? Monitor and evaluate progress toward nutrition goal with team.   Ross Marcus, MS, RD, LDN 07/21/2018 4:27 PM

## 2018-07-27 ENCOUNTER — Ambulatory Visit (HOSPITAL_COMMUNITY): Payer: Self-pay

## 2018-07-27 ENCOUNTER — Telehealth (HOSPITAL_COMMUNITY): Payer: Self-pay | Admitting: Internal Medicine

## 2018-07-27 NOTE — Telephone Encounter (Signed)
Pt called and VM left reminding pt of [facility] policy when sick.  Pt is to be 48 hours symptom free before returning to [facility].  Pt should not return to [facility] until Friday at the earliest.

## 2018-07-28 ENCOUNTER — Telehealth (HOSPITAL_COMMUNITY): Payer: Self-pay | Admitting: Cardiac Rehabilitation

## 2018-07-28 NOTE — Telephone Encounter (Signed)
pc to pt to assess fever symptoms. LMOM advising pt of 48 sick rule policy.  Deveron Furlong, RN, BSN Cardiac Pulmonary Rehab

## 2018-07-29 ENCOUNTER — Ambulatory Visit (HOSPITAL_COMMUNITY): Payer: Self-pay

## 2018-07-30 ENCOUNTER — Other Ambulatory Visit (HOSPITAL_COMMUNITY): Payer: Self-pay | Admitting: Cardiology

## 2018-07-31 ENCOUNTER — Telehealth (HOSPITAL_COMMUNITY): Payer: Self-pay | Admitting: Internal Medicine

## 2018-07-31 ENCOUNTER — Ambulatory Visit (HOSPITAL_COMMUNITY): Payer: Self-pay

## 2018-08-03 ENCOUNTER — Ambulatory Visit (HOSPITAL_COMMUNITY): Payer: Self-pay

## 2018-08-05 ENCOUNTER — Ambulatory Visit (HOSPITAL_COMMUNITY): Payer: Self-pay

## 2018-08-06 NOTE — Progress Notes (Signed)
Cardiac Individual Treatment Plan  Patient Details  Name: John [name] MRN: [id number] Date of Birth: 19-Sep-1960 Referring Provider:     CARDIAC REHAB PHASE II ORIENTATION from 07/21/2018 in [facility] CARDIAC REHAB  Referring Provider  Laurey Morale MD       Initial Encounter Date:    CARDIAC REHAB PHASE II ORIENTATION from 07/21/2018 in [facility] CARDIAC REHAB  Date  07/21/18      Visit Diagnosis: 04/2018 Heart failure, chronic systolic (HCC)  Patient's Home Medications on Admission:  Current Outpatient Medications:  .  acetaminophen (TYLENOL) 325 MG tablet, Take 650 mg by mouth every 6 (six) hours as needed for mild pain., Disp: , Rfl:  .  aspirin 81 MG chewable tablet, Chew 1 tablet (81 mg total) by mouth daily., Disp: , Rfl:  .  benzonatate (TESSALON PERLES) 100 MG capsule, Take 1 capsule (100 mg total) by mouth 3 (three) times daily as needed for cough., Disp: 60 capsule, Rfl: 0 .  carvedilol (COREG) 25 MG tablet, Take 1 tablet (25 mg total) by mouth 2 (two) times daily with a meal., Disp: 60 tablet, Rfl: 6 .  cetirizine (ZYRTEC) 10 MG chewable tablet, Chew 10 mg by mouth daily., Disp: , Rfl:  .  furosemide (LASIX) 80 MG tablet, Take 1 tablet (80 mg total) by mouth every morning, then take 1/2 tablet (40mg  total) by mouth every evening., Disp: 45 tablet, Rfl: 3 .  LORazepam (ATIVAN) 1 MG tablet, Take 0.5-1 mg by mouth every 8 (eight) hours as needed for anxiety., Disp: , Rfl:  .  Multiple Vitamins-Minerals (MULTIVITAMIN WITH MINERALS) tablet, Take 1 tablet by mouth daily., Disp: , Rfl:  .  potassium chloride SA (K-DUR,KLOR-CON) 20 MEQ tablet, Take 20 mEq by mouth daily., Disp: , Rfl:  .  rosuvastatin (CRESTOR) 20 MG tablet, Take 20 mg by mouth daily., Disp: , Rfl:  .  sacubitril-valsartan (ENTRESTO) 49-51 MG, Take 1 tablet by mouth 2 (two) times daily., Disp: 60 tablet, Rfl: 3 .  sertraline (ZOLOFT) 25 MG tablet, Take 1 tablet (25 mg  total) by mouth daily., Disp: 30 tablet, Rfl: 11 .  sildenafil (VIAGRA) 100 MG tablet, TAKE 0.5 TABLETS (50 MG TOTAL) BY MOUTH DAILY AS NEEDED FOR ERECTILE DYSFUNCTION., Disp: 6 tablet, Rfl: 1 .  sildenafil (VIAGRA) 100 MG tablet, TAKE 0.5 TABLETS (50 MG TOTAL) BY MOUTH DAILY AS NEEDED FOR ERECTILE DYSFUNCTION., Disp: 6 tablet, Rfl: 1 .  spironolactone (ALDACTONE) 25 MG tablet, Take 1 tablet (25 mg total) by mouth daily., Disp: 90 tablet, Rfl: 3  Past Medical History: Past Medical History:  Diagnosis Date  . Anxiety    Ativan  . Chronic combined systolic and diastolic CHF, NYHA class 3 (HCC) 10/2016   Nonischemic cardiomyopathy. EF 20-25%.  . Chronic left hip pain   . Depression    history of  . GI bleed 03/2016  . Headache   . Hypertensive heart disease with combined systolic and diastolic congestive heart failure (HCC) 10/2016  . Nonischemic cardiomyopathy (HCC) 10/2016   Echo with EF 20-25%. Cardiac catheterization with no CAD. LVEDP was 41 mmHg, PCWP 36 mmHg  . Osteoarthritis    right shoulder  . Right hand fracture   . Seasonal allergies   . Sleep apnea    wears a CPAP  . Wears glasses     Tobacco Use: Social History   Tobacco Use  Smoking Status Never Smoker  Smokeless Tobacco Never Used  Labs: Recent Review Flowsheet Data    Labs for ITP Cardiac and Pulmonary Rehab Latest Ref Rng & Units 10/21/2016 10/21/2016 06/09/2018   Cholestrol 100 - 199 mg/dL - - 979   LDLCALC 0 - 99 mg/dL - - 45   HDL >48 mg/dL - - 53   Trlycerides 0 - 149 mg/dL - - 66   PHART 0.165 - 7.450 - 7.427 -   PCO2ART 32.0 - 48.0 mmHg - 41.7 -   HCO3 20.0 - 28.0 mmol/L 29.3(H) 27.5 -   TCO2 0 - 100 mmol/L 31 29 -   O2SAT % 61.0 99.0 -      Capillary Blood Glucose: No results found for: GLUCAP   Exercise Target Goals: Exercise Program Goal: Individual exercise prescription set using results from initial 6 min walk test and THRR while considering  patient's activity barriers and safety.    Exercise Prescription Goal: Initial exercise prescription builds to 30-45 minutes a day of aerobic activity, 2-3 days per week.  Home exercise guidelines will be given to patient during program as part of exercise prescription that the participant will acknowledge.  Activity Barriers & Risk Stratification: Activity Barriers & Cardiac Risk Stratification - 07/21/18 1422      Activity Barriers & Cardiac Risk Stratification   Activity Barriers  Left Hip Replacement;Other (comment);Joint Problems    Comments  Bilateral shoulder replacement    Cardiac Risk Stratification  High       6 Minute Walk: 6 Minute Walk    Row Name 07/21/18 1410         6 Minute Walk   Phase  Initial     Distance  2025 feet     Walk Time  6 minutes     # of Rest Breaks  0     MPH  3.84     METS  4.11     RPE  12     Perceived Dyspnea   0     VO2 Peak  14.4     Symptoms  No     Resting HR  68 bpm     Resting BP  128/70     Resting Oxygen Saturation   97 %     Exercise Oxygen Saturation  during 6 min walk  96 %     Max Ex. HR  104 bpm     Max Ex. BP  132/72     2 Minute Post BP  120/70        Oxygen Initial Assessment:   Oxygen Re-Evaluation:   Oxygen Discharge (Final Oxygen Re-Evaluation):   Initial Exercise Prescription: Initial Exercise Prescription - 07/21/18 1400      Date of Initial Exercise RX and Referring Provider   Date  07/21/18    Referring Provider  Laurey Morale MD     Expected Discharge Date  10/26/18      Bike   Level  1.5    Minutes  10    METs  3.7      NuStep   Level  2    SPM  75    Minutes  10    METs  3.5      Track   Laps  16    Minutes  10    METs  3.76      Prescription Details   Frequency (times per week)  3x    Duration  Progress to 30 minutes of continuous aerobic without signs/symptoms of physical distress  Intensity   THRR 40-80% of Max Heartrate  65-131    Ratings of Perceived Exertion  11-13    Perceived Dyspnea  0-4       Progression   Progression  Continue progressive overload as per policy without signs/symptoms or physical distress.      Resistance Training   Training Prescription  Yes    Weight  3lbs    Reps  10-15       Perform Capillary Blood Glucose checks as needed.  Exercise Prescription Changes:   Exercise Comments: Exercise Comments    Row Name 08/05/18 1707           Exercise Comments  Pt has not started exercising yet since orientation on 07/21/2018 due to illness and lack of sleep. Pt is planning to start rehab on Friday.           Exercise Goals and Review: Exercise Goals    Row Name 07/21/18 1424             Exercise Goals   Increase Physical Activity  Yes       Intervention  Provide advice, education, support and counseling about physical activity/exercise needs.;Develop an individualized exercise prescription for aerobic and resistive training based on initial evaluation findings, risk stratification, comorbidities and participant's personal goals.       Expected Outcomes  Short Term: Attend rehab on a regular basis to increase amount of physical activity.;Long Term: Exercising regularly at least 3-5 days a week.;Long Term: Add in home exercise to make exercise part of routine and to increase amount of physical activity.       Increase Strength and Stamina  Yes       Intervention  Provide advice, education, support and counseling about physical activity/exercise needs.;Develop an individualized exercise prescription for aerobic and resistive training based on initial evaluation findings, risk stratification, comorbidities and participant's personal goals.       Expected Outcomes  Short Term: Increase workloads from initial exercise prescription for resistance, speed, and METs.;Short Term: Perform resistance training exercises routinely during rehab and add in resistance training at home;Long Term: Improve cardiorespiratory fitness, muscular endurance and strength as measured by  increased METs and functional capacity ( )       Able to understand and use rate of perceived exertion (RPE) scale  Yes       Intervention  Provide education and explanation on how to use RPE scale       Expected Outcomes  Short Term: Able to use RPE daily in rehab to express subjective intensity level;Long Term:  Able to use RPE to guide intensity level when exercising independently       Knowledge and understanding of Target Heart Rate Range (THRR)  Yes       Intervention  Provide education and explanation of THRR including how the numbers were predicted and where they are located for reference       Expected Outcomes  Short Term: Able to state/look up THRR;Long Term: Able to use THRR to govern intensity when exercising independently;Short Term: Able to use daily as guideline for intensity in rehab       Able to check pulse independently  Yes       Intervention  Provide education and demonstration on how to check pulse in carotid and radial arteries.;Review the importance of being able to check your own pulse for safety during independent exercise       Expected Outcomes  Short Term: Able to explain why  pulse checking is important during independent exercise;Long Term: Able to check pulse independently and accurately       Understanding of Exercise Prescription  Yes       Intervention  Provide education, explanation, and written materials on patient's individual exercise prescription       Expected Outcomes  Short Term: Able to explain program exercise prescription;Long Term: Able to explain home exercise prescription to exercise independently          Exercise Goals Re-Evaluation :   Discharge Exercise Prescription (Final Exercise Prescription Changes):   Nutrition:  Target Goals: Understanding of nutrition guidelines, daily intake of sodium 1500mg , cholesterol 200mg , calories 30% from fat and 7% or less from saturated fats, daily to have 5 or more servings of fruits and  vegetables.  Biometrics: Pre Biometrics - 07/21/18 1408      Pre Biometrics   Height  5' 9.5" (1.765 m)    Weight  105.2 kg    Waist Circumference  49 inches    Hip Circumference  46 inches    Waist to Hip Ratio  1.07 %    BMI (Calculated)  33.77    Triceps Skinfold  23 mm    % Body Fat  35.3 %    Grip Strength  33 kg    Flexibility  0 in    Single Leg Stand  30 seconds        Nutrition Therapy Plan and Nutrition Goals: Nutrition Therapy & Goals - 07/21/18 1516      Nutrition Therapy   Diet  heart healthy      Personal Nutrition Goals   Nutrition Goal  Pt to identify and limit food sources of saturated fat, trans fat, refined carbohydrates and sodium    Personal Goal #2  Pt to identify food quantities necessary to achieve weight loss of 6-24 lbs. at graduation from cardiac rehab.    Personal Goal #3  Pt to eat a variety of non-starchy vegetables.    Personal Goal #4  Pt to make good choices when eating out at restaurants      Intervention Plan   Intervention  Prescribe, educate and counsel regarding individualized specific dietary modifications aiming towards targeted core components such as weight, hypertension, lipid management, diabetes, heart failure and other comorbidities.    Expected Outcomes  Short Term Goal: Understand basic principles of dietary content, such as calories, fat, sodium, cholesterol and nutrients.;Long Term Goal: Adherence to prescribed nutrition plan.       Nutrition Assessments: Nutrition Assessments - 07/21/18 1517      MEDFICTS Scores   Pre Score  44       Nutrition Goals Re-Evaluation: Nutrition Goals Re-Evaluation    Row Name 07/21/18 1516             Goals   Current Weight  231 lb 14.8 oz (105.2 kg)          Nutrition Goals Re-Evaluation: Nutrition Goals Re-Evaluation    Row Name 07/21/18 1516             Goals   Current Weight  231 lb 14.8 oz (105.2 kg)          Nutrition Goals Discharge (Final Nutrition Goals  Re-Evaluation): Nutrition Goals Re-Evaluation - 07/21/18 1516      Goals   Current Weight  231 lb 14.8 oz (105.2 kg)       Psychosocial: Target Goals: Acknowledge presence or absence of significant depression and/or stress, maximize coping skills,  provide positive support system. Participant is able to verbalize types and ability to use techniques and skills needed for reducing stress and depression.  Initial Review & Psychosocial Screening: Initial Psych Review & Screening - 07/21/18 1453      Initial Review   Current issues with  History of Depression;Current Stress Concerns    Source of Stress Concerns  Chronic Illness;Occupation;Financial;Poor Coping Skills      Family Dynamics   Good Support System?  Yes   family, girlfriend      Barriers   Psychosocial barriers to participate in program  The patient should benefit from training in stress management and relaxation.      Screening Interventions   Interventions  Encouraged to exercise;To provide support and resources with identified psychosocial needs;Provide feedback about the scores to participant    Expected Outcomes  Short Term goal: Identification and review with participant of any Quality of Life or Depression concerns found by scoring the questionnaire.;Long Term goal: The participant improves quality of Life and PHQ9 Scores as seen by post scores and/or verbalization of changes       Quality of Life Scores: Quality of Life - 07/21/18 1355      Quality of Life   Select  Quality of Life      Quality of Life Scores   Health/Function Pre  17 %    Socioeconomic Pre  23.07 %    Psych/Spiritual Pre  17.14 %    Family Pre  27.6 %    GLOBAL Pre  19.84 %      Scores of 19 and below usually indicate a poorer quality of life in these areas.  A difference of  2-3 points is a clinically meaningful difference.  A difference of 2-3 points in the total score of the Quality of Life Index has been associated with significant  improvement in overall quality of life, self-image, physical symptoms, and general health in studies assessing change in quality of life.  PHQ-9: Recent Review Flowsheet Data    Depression screen [facility] 2/9 02/04/2018 06/23/2017   Decreased Interest 0 0   Down, Depressed, Hopeless 0 0   PHQ - 2 Score 0 0     Interpretation of Total Score  Total Score Depression Severity:  1-4 = Minimal depression, 5-9 = Mild depression, 10-14 = Moderate depression, 15-19 = Moderately severe depression, 20-27 = Severe depression   Psychosocial Evaluation and Intervention:   Psychosocial Re-Evaluation:   Psychosocial Discharge (Final Psychosocial Re-Evaluation):   Vocational Rehabilitation: Provide vocational rehab assistance to qualifying candidates.   Vocational Rehab Evaluation & Intervention: Vocational Rehab - 07/21/18 1456      Initial Vocational Rehab Evaluation & Intervention   Assessment shows need for Vocational Rehabilitation  Yes   frequent travel      Education: Education Goals: Education classes will be provided on a weekly basis, covering required topics. Participant will state understanding/return demonstration of topics presented.  Learning Barriers/Preferences: Learning Barriers/Preferences - 07/21/18 1356      Learning Barriers/Preferences   Learning Barriers  Sight    Learning Preferences  Skilled Demonstration;Verbal Instruction;Video;Audio       Education Topics: Count Your Pulse:  -Group instruction provided by verbal instruction, demonstration, patient participation and written materials to support subject.  Instructors address importance of being able to find your pulse and how to count your pulse when at home without a heart monitor.  Patients get hands on experience counting their pulse with staff help and individually.  Heart Attack, Angina, and Risk Factor Modification:  -Group instruction provided by verbal instruction, video, and written materials to  support subject.  Instructors address signs and symptoms of angina and heart attacks.    Also discuss risk factors for heart disease and how to make changes to improve heart health risk factors.   Functional Fitness:  -Group instruction provided by verbal instruction, demonstration, patient participation, and written materials to support subject.  Instructors address safety measures for doing things around the house.  Discuss how to get up and down off the floor, how to pick things up properly, how to safely get out of a chair without assistance, and balance training.   Meditation and Mindfulness:  -Group instruction provided by verbal instruction, patient participation, and written materials to support subject.  Instructor addresses importance of mindfulness and meditation practice to help reduce stress and improve awareness.  Instructor also leads participants through a meditation exercise.    Stretching for Flexibility and Mobility:  -Group instruction provided by verbal instruction, patient participation, and written materials to support subject.  Instructors lead participants through series of stretches that are designed to increase flexibility thus improving mobility.  These stretches are additional exercise for major muscle groups that are typically performed during regular warm up and cool down.   Hands Only CPR:  -Group verbal, video, and participation provides a basic overview of AHA guidelines for community CPR. Role-play of emergencies allow participants the opportunity to practice calling for help and chest compression technique with discussion of AED use.   Hypertension: -Group verbal and written instruction that provides a basic overview of hypertension including the most recent diagnostic guidelines, risk factor reduction with self-care instructions and medication management.    Nutrition I class: Heart Healthy Eating:  -Group instruction provided by PowerPoint slides, verbal  discussion, and written materials to support subject matter. The instructor gives an explanation and review of the Therapeutic Lifestyle Changes diet recommendations, which includes a discussion on lipid goals, dietary fat, sodium, fiber, plant stanol/sterol esters, sugar, and the components of a well-balanced, healthy diet.   Nutrition II class: Lifestyle Skills:  -Group instruction provided by PowerPoint slides, verbal discussion, and written materials to support subject matter. The instructor gives an explanation and review of label reading, grocery shopping for heart health, heart healthy recipe modifications, and ways to make healthier choices when eating out.   Diabetes Question & Answer:  -Group instruction provided by PowerPoint slides, verbal discussion, and written materials to support subject matter. The instructor gives an explanation and review of diabetes co-morbidities, pre- and post-prandial blood glucose goals, pre-exercise blood glucose goals, signs, symptoms, and treatment of hypoglycemia and hyperglycemia, and foot care basics.   Diabetes Blitz:  -Group instruction provided by PowerPoint slides, verbal discussion, and written materials to support subject matter. The instructor gives an explanation and review of the physiology behind type 1 and type 2 diabetes, diabetes medications and rational behind using different medications, pre- and post-prandial blood glucose recommendations and Hemoglobin A1c goals, diabetes diet, and exercise including blood glucose guidelines for exercising safely.    Portion Distortion:  -Group instruction provided by PowerPoint slides, verbal discussion, written materials, and food models to support subject matter. The instructor gives an explanation of serving size versus portion size, changes in portions sizes over the last 20 years, and what consists of a serving from each food group.   Stress Management:  -Group instruction provided by verbal  instruction, video, and written materials to support subject matter.  Instructors review role of stress in heart disease and how to cope with stress positively.     Exercising on Your Own:  -Group instruction provided by verbal instruction, power point, and written materials to support subject.  Instructors discuss benefits of exercise, components of exercise, frequency and intensity of exercise, and end points for exercise.  Also discuss use of nitroglycerin and activating EMS.  Review options of places to exercise outside of rehab.  Review guidelines for sex with heart disease.   Cardiac Drugs I:  -Group instruction provided by verbal instruction and written materials to support subject.  Instructor reviews cardiac drug classes: antiplatelets, anticoagulants, beta blockers, and statins.  Instructor discusses reasons, side effects, and lifestyle considerations for each drug class.   Cardiac Drugs II:  -Group instruction provided by verbal instruction and written materials to support subject.  Instructor reviews cardiac drug classes: angiotensin converting enzyme inhibitors (ACE-I), angiotensin II receptor blockers (ARBs), nitrates, and calcium channel blockers.  Instructor discusses reasons, side effects, and lifestyle considerations for each drug class.   Anatomy and Physiology of the Circulatory System:  Group verbal and written instruction and models provide basic cardiac anatomy and physiology, with the coronary electrical and arterial systems. Review of: AMI, Angina, Valve disease, Heart Failure, Peripheral Artery Disease, Cardiac Arrhythmia, Pacemakers, and the ICD.   Other Education:  -Group or individual verbal, written, or video instructions that support the educational goals of the cardiac rehab program.   Holiday Eating Survival Tips:  -Group instruction provided by PowerPoint slides, verbal discussion, and written materials to support subject matter. The instructor gives patients  tips, tricks, and techniques to help them not only survive but enjoy the holidays despite the onslaught of food that accompanies the holidays.   Knowledge Questionnaire Score: Knowledge Questionnaire Score - 07/21/18 1355      Knowledge Questionnaire Score   Pre Score  23/24       Core Components/Risk Factors/Patient Goals at Admission: Personal Goals and Risk Factors at Admission - 07/21/18 1400      Core Components/Risk Factors/Patient Goals on Admission   Admit Weight  231 lb 14.8 oz (105.2 kg)    Goal Weight: Long Term  221 lb (100.2 kg)       Core Components/Risk Factors/Patient Goals Review:    Core Components/Risk Factors/Patient Goals at Discharge (Final Review):    ITP Comments: ITP Comments    Row Name 07/21/18 1335 08/06/18 0738         ITP Comments  Dr. Armanda Magic, Medical Director   30 Day ITP Review.  Pt has yet to start CR d/t illness and work conflicts.  Scheduled to start 08/07/18.         Comments: See ITP Comments.

## 2018-08-07 ENCOUNTER — Ambulatory Visit (HOSPITAL_COMMUNITY): Payer: Self-pay

## 2018-08-07 ENCOUNTER — Telehealth (HOSPITAL_COMMUNITY): Payer: Self-pay | Admitting: Internal Medicine

## 2018-08-09 ENCOUNTER — Other Ambulatory Visit (HOSPITAL_COMMUNITY): Payer: Self-pay | Admitting: Cardiology

## 2018-08-10 ENCOUNTER — Ambulatory Visit (HOSPITAL_COMMUNITY): Payer: Self-pay

## 2018-08-10 NOTE — Telephone Encounter (Signed)
Pt visited cardiac rehab to discuss absences.  Pt with lack of sleep with increased stress at work and recovering from an illness.  John [name] will be out of town for the next week.  He plans to start cardiac rehab on 08/17/18.

## 2018-08-12 ENCOUNTER — Encounter: Payer: Self-pay | Admitting: Cardiovascular Disease

## 2018-08-12 ENCOUNTER — Ambulatory Visit (HOSPITAL_COMMUNITY): Payer: Self-pay

## 2018-08-14 ENCOUNTER — Ambulatory Visit (HOSPITAL_COMMUNITY): Payer: Self-pay

## 2018-08-17 ENCOUNTER — Ambulatory Visit (HOSPITAL_COMMUNITY): Payer: Self-pay

## 2018-08-17 ENCOUNTER — Telehealth (HOSPITAL_COMMUNITY): Payer: Self-pay | Admitting: *Deleted

## 2018-08-18 ENCOUNTER — Telehealth (HOSPITAL_COMMUNITY): Payer: Self-pay | Admitting: Internal Medicine

## 2018-08-19 ENCOUNTER — Telehealth (HOSPITAL_COMMUNITY): Payer: Self-pay | Admitting: Cardiac Rehabilitation

## 2018-08-19 ENCOUNTER — Ambulatory Visit (HOSPITAL_COMMUNITY): Payer: Self-pay

## 2018-08-19 NOTE — Telephone Encounter (Signed)
Pt returned call.  Pt states he has Flu like symptoms for 3 days, body aches, fever.  PCP advised him over the telephone to use theraflu and hold activities this week.  Pt traveled by car over the Thanksgiving weekend and feels he caught the virus during this time. Pt advised to contact PCP for office visit since he had febrile illness a few weeks ago.  Pt verbalizes interest in starting cardiac rehab and would like to begin group exercise on Monday August 24, 2018.  Pt instructed to get MD clearance to start CR. Understanding verbalized.  Deveron Furlong, RN, BSN Cardiac Pulmonary Rehab 08/19/18 2:53 PM

## 2018-08-19 NOTE — Telephone Encounter (Signed)
pc to pt to assess reason for continued absence from cardiac rehab.  LMOM.  Deveron Furlong, RN, BSN Cardiac Pulmonary Rehab 08/19/18 2:36 PM

## 2018-08-21 ENCOUNTER — Ambulatory Visit (HOSPITAL_COMMUNITY): Payer: Self-pay

## 2018-08-21 ENCOUNTER — Telehealth (HOSPITAL_COMMUNITY): Payer: Self-pay | Admitting: Internal Medicine

## 2018-08-24 ENCOUNTER — Ambulatory Visit (HOSPITAL_COMMUNITY)
Admission: RE | Admit: 2018-08-24 | Discharge: 2018-08-24 | Disposition: A | Discharge status: Home / Self Care | Payer: 59 | Source: Ambulatory Visit | Attending: Cardiology | Admitting: Cardiology

## 2018-08-24 ENCOUNTER — Encounter (HOSPITAL_COMMUNITY): Payer: Self-pay

## 2018-08-24 ENCOUNTER — Ambulatory Visit (HOSPITAL_COMMUNITY): Payer: Self-pay

## 2018-08-24 ENCOUNTER — Encounter (HOSPITAL_COMMUNITY): Payer: Self-pay | Admitting: Cardiology

## 2018-08-24 ENCOUNTER — Telehealth (HOSPITAL_COMMUNITY): Payer: Self-pay

## 2018-08-24 ENCOUNTER — Ambulatory Visit (HOSPITAL_COMMUNITY)
Admission: RE | Admit: 2018-08-24 | Discharge: 2018-08-24 | Disposition: A | Discharge status: Home / Self Care | Payer: 59 | Source: Ambulatory Visit | Attending: Student | Admitting: Student

## 2018-08-24 VITALS — BP 156/108 | HR 85 | Wt 229.6 lb

## 2018-08-24 DIAGNOSIS — I5022 Chronic systolic (congestive) heart failure: Secondary | ICD-10-CM | POA: Diagnosis present

## 2018-08-24 DIAGNOSIS — G4733 Obstructive sleep apnea (adult) (pediatric): Secondary | ICD-10-CM | POA: Insufficient documentation

## 2018-08-24 DIAGNOSIS — I5082 Biventricular heart failure: Secondary | ICD-10-CM | POA: Diagnosis not present

## 2018-08-24 DIAGNOSIS — Z79899 Other long term (current) drug therapy: Secondary | ICD-10-CM | POA: Diagnosis not present

## 2018-08-24 DIAGNOSIS — F329 Major depressive disorder, single episode, unspecified: Secondary | ICD-10-CM | POA: Diagnosis not present

## 2018-08-24 DIAGNOSIS — I428 Other cardiomyopathies: Secondary | ICD-10-CM | POA: Diagnosis not present

## 2018-08-24 DIAGNOSIS — R079 Chest pain, unspecified: Secondary | ICD-10-CM | POA: Diagnosis not present

## 2018-08-24 DIAGNOSIS — R0789 Other chest pain: Secondary | ICD-10-CM | POA: Diagnosis not present

## 2018-08-24 DIAGNOSIS — Z7982 Long term (current) use of aspirin: Secondary | ICD-10-CM | POA: Diagnosis not present

## 2018-08-24 DIAGNOSIS — F419 Anxiety disorder, unspecified: Secondary | ICD-10-CM

## 2018-08-24 DIAGNOSIS — Z95810 Presence of automatic (implantable) cardiac defibrillator: Secondary | ICD-10-CM | POA: Insufficient documentation

## 2018-08-24 LAB — BASIC METABOLIC PANEL
Anion gap: 13 (ref 5–15)
BUN: 11 mg/dL (ref 6–20)
CO2: 25 mmol/L (ref 22–32)
CREATININE: 0.94 mg/dL (ref 0.61–1.24)
Calcium: 9.3 mg/dL (ref 8.9–10.3)
Chloride: 101 mmol/L (ref 98–111)
GFR calc Af Amer: >60 mL/min (ref >60)
GLUCOSE: 104 mg/dL — AB (ref 70–99)
Potassium: 3.9 mmol/L (ref 3.5–5.1)
SODIUM: 139 mmol/L (ref 135–145)

## 2018-08-24 LAB — CBC
HCT: 41.7 % (ref 39.0–52.0)
HEMOGLOBIN: 13.2 g/dL (ref 13.0–17.0)
MCH: 32.1 pg (ref 26.0–34.0)
MCHC: 31.7 g/dL (ref 30.0–36.0)
MCV: 101.5 fL — ABNORMAL HIGH (ref 80.0–100.0)
Platelets: 282 10*3/uL (ref 150–400)
RBC: 4.11 MIL/uL — ABNORMAL LOW (ref 4.22–5.81)
RDW: 14.1 % (ref 11.5–15.5)
WBC: 8.3 10*3/uL (ref 4.0–10.5)
nRBC: 0 % (ref 0.0–0.2)

## 2018-08-24 LAB — MAGNESIUM: MAGNESIUM: 2.1 mg/dL (ref 1.7–2.4)

## 2018-08-24 NOTE — Progress Notes (Addendum)
PCP: Dr. Nolen Mu Cardiology: Dr. Allyson Sabal HF Cardiology: Dr. Rupert Stacks Geise is a 57 y.o. male with history of nonischemic cardiomyopathy and OSA was referred by Dr. Allyson Sabal for evaluation of CHF.  He was initially diagnosed in early 2/18.  He had URI symptoms then developed dyspnea and put on weight. Echo was done in 2/18, showing EF 20-25%.  LHC in 2/18 showed normal coronaries. He was started on treatment for cardiomyopathy, and EF rose to 40% by 6/18. However, most recent echo in 6/19 showed EF back down to 15-20% with severe RV dilation/dysfunction.  CPX was done in 7/19, showed moderate functional limitation though submaximal.  He had St Jude ICD placed in 8/19.   Pt presents today for add on due to perceived ICD shock.  He states he was lying on the couch last night, and went to get up when he had a sudden chest pain.  He states it felt like he got "kicked in the chest" and he felt as if that were his ICD going off.  ICD interrogation as below shows no therapies or problems with his ICD. He remains tender in his chest, including when moving his R arm. He is very anxious and requesting to be written out of work and to have his anxiety medications adjusted. Using CPAP. They recently got back from a trip from [place], and he felt like he may have had the flu last week. He sees his PCP this afternoon.   ICD interrogation: NO ICD shocks or therapies. Thoracic impedence had been low for a week, but now nearing baseline. No programming irregularities per [facility] rep.   Labs (7/19): K 4.9, creatinine 0.91 Labs (8/19): Na 128, K 4.9, creatinine 0.72 Labs (9/19): LDL 45   PMH: 1. OSA: uses CPAP.  2. Depression 3. Chronic systolic CHF: Nonischemic cardiomyopathy.  Diagnosed around 2/18.  - LHC (2/18): Normal coronaries.  - Echo (2/18): EF 20-25% - Echo (6/18): EF 40% - Echo (12/18): EF 35-40% - Echo (6/19): LV mildly dilated, EF 15-20%, RV severely dilated with severely decreased systolic  function.  - CPX (7/19): peak VO2 16, VE/VCO2 slope 37, RER 1.03 => submaximal but probably moderate functional limitation.  - Cardiac MRI (7/19): EF 29% with mild LV dilation, mildly decreased RV systolic function EF 40%, mid-wall LGE throughtout the septal wall and the inferor wall.  - St Jude ICD placed in 8/19.   SH: Originally from [place], divorced with 3 kids, works in sales/logistics, never smoked, moderate ETOH intake. No drugs. Lives in [place].   FH: Sister with congenital heart abnormality.  No other cardiac disease.   Review of systems complete and found to be negative unless listed in HPI.    Current Outpatient Medications  Medication Sig Dispense Refill  . acetaminophen (TYLENOL) 325 MG tablet Take 650 mg by mouth every 6 (six) hours as needed for mild pain.    Marland Kitchen aspirin 81 MG chewable tablet Chew 1 tablet (81 mg total) by mouth daily.    . benzonatate (TESSALON PERLES) 100 MG capsule Take 1 capsule (100 mg total) by mouth 3 (three) times daily as needed for cough. 60 capsule 0  . carvedilol (COREG) 25 MG tablet Take 1 tablet (25 mg total) by mouth 2 (two) times daily with a meal. 60 tablet 6  . cetirizine (ZYRTEC) 10 MG chewable tablet Chew 10 mg by mouth daily.    . furosemide (LASIX) 80 MG tablet Take 1 tablet (80 mg total) by mouth  every morning, then take 1/2 tablet (40mg  total) by mouth every evening. 45 tablet 3  . LORazepam (ATIVAN) 1 MG tablet Take 0.5-1 mg by mouth every 8 (eight) hours as needed for anxiety.    . Multiple Vitamins-Minerals (MULTIVITAMIN WITH MINERALS) tablet Take 1 tablet by mouth daily.    . potassium chloride SA (K-DUR,KLOR-CON) 20 MEQ tablet Take 20 mEq by mouth daily.    . rosuvastatin (CRESTOR) 20 MG tablet Take 20 mg by mouth daily.    . sacubitril-valsartan (ENTRESTO) 49-51 MG Take 1 tablet by mouth 2 (two) times daily. 60 tablet 3  . sertraline (ZOLOFT) 25 MG tablet Take 1 tablet (25 mg total) by mouth daily. 30 tablet 11  . sildenafil  (VIAGRA) 100 MG tablet TAKE 0.5 TABLETS (50 MG TOTAL) BY MOUTH DAILY AS NEEDED FOR ERECTILE DYSFUNCTION. 6 tablet 1  . sildenafil (VIAGRA) 100 MG tablet TAKE 0.5 TABLETS (50 MG TOTAL) BY MOUTH DAILY AS NEEDED FOR ERECTILE DYSFUNCTION. 6 tablet 1  . spironolactone (ALDACTONE) 25 MG tablet Take 1 tablet (25 mg total) by mouth daily. 30 tablet 3  . spironolactone (ALDACTONE) 25 MG tablet Take 1 tablet (25 mg total) by mouth daily. 90 tablet 3   No current facility-administered medications for this encounter.    Vitals:   08/24/18 1113  BP: (!) 156/108  Pulse: 85  SpO2: 100%  Weight: 104.1 kg (229 lb 9.6 oz)   Wt Readings from Last 3 Encounters:  08/24/18 104.1 kg (229 lb 9.6 oz)  07/21/18 105.2 kg (231 lb 14.8 oz)  06/11/18 109.2 kg (240 lb 12.8 oz)    General: Anxious appearing. No resp difficulty. HEENT: Normal Neck: Supple. JVP 5-6. Carotids 2+ bilat; no bruits. No thyromegaly or nodule noted. Cor: PMI nondisplaced. RRR, No M/G/R noted. Mild tenderness from epigastric region and left chest.  Lungs: CTAB, normal effort. Abdomen: Soft, non-tender, non-distended, no HSM. No bruits or masses. +BS  Extremities: No cyanosis, clubbing, or rash. R and LLE no edema. Pt winces when lifting his RUE whether under AROM or PROM, and states he cannot drive.  Neuro: Alert & orientedx3, cranial nerves grossly intact. Affect pleasant   Assessment/Plan: 1. Chronic systolic CHF: Nonischemic cardiomyopathy with biventricular failure.  Cath in 2/18 with no coronary disease.  Echo (6/19) with EF 15-20%, severe RV dilation/dysfunction.  CPX 7/19 submaximal but probably moderate functional limitation.  Cardiac MRI in 7/19 showed EF 29%.  LGE pattern was concerning for prior viral myocarditis. Now with St Jude ICD.  Cause of CMP is uncertain, never a very heavy drinker and no drugs.  No family history.  Possible viral cardiomyopathy. Symptoms improved, NYHA class II. Not volume overloaded on exam or corevue.   - Continue Lasix 80 qam/40 qpm.  BMET today.  - Continue spironolactone 25 mg daily.   - Continue Entresto 97/103 bid and Coreg 25 mg bid.  - Cardiac rehab to start 11/1.  - Close followup, may need advanced therapies down the road. Repeat echo in 12/19 to reassess LV function.  2. Atypical chest pain - While getting off of the couch.  ICD interrogation negative for therapies and functioning normally. CXR with formal read pending, but ICD appears stable.  - Electrolytes WNL and labs unremarkable. - Suspect this is most likely musculoskeletal and recommended symptomatic treatment.  - I am also concerned over the possibility of malingering with patients insistence on being written out of work. This could also be just overwhelming anxiety.  3. OSA:  -  Encouraged nightly CPAP use.  4. Anxiety:  - Continue sertraline 25 mg daily for generalized anxiety.  - Patient has follow up with his PCP this afternoon. Though this medication was initiated here, we recommended titration and consideration of additional medications should come through his PCP.   Pt presented today with atypical chest pain after what he felt was an ICD shock. No ICD shock occurred. Pt likely has musculoskeletal injury vs malingering. I have written him out of work through 08/26/18 due to him missing today and his severe anxiety.  Have recommended his anxiety be further managed through his PCP.   We will update patient on his CXR once results come through.   Discussed all above with Dr. Shirlee Latch who agreed. Keep appointment with Dr. Shirlee Latch next week.   Graciella Freer, PA-C  08/24/2018  Greater than 50% of the 25 minute visit was spent in counseling/coordination of care regarding disease state education, salt/fluid restriction, sliding scale diuretics, and medication compliance.

## 2018-08-24 NOTE — Patient Instructions (Signed)
It was great to see you today! No medication changes are needed at this time.  Please use OTC tylenol for pain  Labs today We will only contact you if something comes back abnormal or we need to make some changes. Otherwise no news is good news!  Keep your follow up as scheduled next week with Dr Shirlee Latch  A chest x-ray takes a picture of the organs and structures inside the chest, including the heart, lungs, and blood vessels. This test can show several things, including, whether the heart is enlarges; whether fluid is building up in the lungs; and whether pacemaker / defibrillator leads are still in place.   Do the following things EVERYDAY: 1) Weigh yourself in the morning before breakfast. Write it down and keep it in a log. 2) Take your medicines as prescribed 3) Eat low salt foods-Limit salt (sodium) to 2000 mg per day.  4) Stay as active as you can everyday 5) Limit all fluids for the day to less than 2 liters

## 2018-08-24 NOTE — Progress Notes (Signed)
Work letter

## 2018-08-24 NOTE — Telephone Encounter (Signed)
-----   Message from Graciella Freer, PA-C sent at 08/24/2018  4:20 PM EST ----- Please tell him that his labwork looked completely normal. His CXR has not been formally read yet, but we will follow up

## 2018-08-24 NOTE — Telephone Encounter (Signed)
Called for lab results. Left voicemail.

## 2018-08-24 NOTE — Addendum Note (Signed)
Encounter addended by: Graciella Freer, PA-C on: 08/24/2018 1:35 PM  Actions taken: Sign clinical note

## 2018-08-26 ENCOUNTER — Ambulatory Visit (HOSPITAL_COMMUNITY): Payer: Self-pay

## 2018-08-28 ENCOUNTER — Telehealth (HOSPITAL_COMMUNITY): Payer: Self-pay

## 2018-08-28 ENCOUNTER — Ambulatory Visit (HOSPITAL_COMMUNITY): Payer: Self-pay

## 2018-08-28 NOTE — Telephone Encounter (Signed)
Pt left voicemail to call out of rehab. Returned pt's call to inform pt that he has been dropped from program. Pt has not returned to exercise, since orientation was completed on 07/21/2018. Pt did not answer. Left Voicemail.

## 2018-08-31 ENCOUNTER — Ambulatory Visit (HOSPITAL_COMMUNITY): Payer: Self-pay

## 2018-08-31 NOTE — Telephone Encounter (Signed)
Pt returned phone call. Explained to pt that he will be discharged from program due to not returning since 07/21/2018. Pt verbalized understanding after a little resistance.

## 2018-09-01 ENCOUNTER — Ambulatory Visit (HOSPITAL_COMMUNITY)
Admission: RE | Admit: 2018-09-01 | Discharge: 2018-09-01 | Disposition: A | Discharge status: Home / Self Care | Payer: 59 | Source: Ambulatory Visit | Attending: Cardiology | Admitting: Cardiology

## 2018-09-01 ENCOUNTER — Ambulatory Visit (HOSPITAL_BASED_OUTPATIENT_CLINIC_OR_DEPARTMENT_OTHER)
Admission: RE | Admit: 2018-09-01 | Discharge: 2018-09-01 | Disposition: A | Discharge status: Home / Self Care | Payer: 59 | Source: Ambulatory Visit | Attending: Cardiology | Admitting: Cardiology

## 2018-09-01 ENCOUNTER — Encounter (HOSPITAL_COMMUNITY): Payer: Self-pay | Admitting: Cardiology

## 2018-09-01 VITALS — BP 160/90 | HR 113 | Wt 236.6 lb

## 2018-09-01 DIAGNOSIS — I110 Hypertensive heart disease with heart failure: Secondary | ICD-10-CM | POA: Insufficient documentation

## 2018-09-01 DIAGNOSIS — E785 Hyperlipidemia, unspecified: Secondary | ICD-10-CM | POA: Diagnosis not present

## 2018-09-01 DIAGNOSIS — R9431 Abnormal electrocardiogram [ECG] [EKG]: Secondary | ICD-10-CM | POA: Diagnosis not present

## 2018-09-01 DIAGNOSIS — R000 Tachycardia, unspecified: Secondary | ICD-10-CM | POA: Diagnosis not present

## 2018-09-01 DIAGNOSIS — I5022 Chronic systolic (congestive) heart failure: Secondary | ICD-10-CM | POA: Diagnosis not present

## 2018-09-01 DIAGNOSIS — I428 Other cardiomyopathies: Secondary | ICD-10-CM | POA: Insufficient documentation

## 2018-09-01 MED ORDER — SILDENAFIL CITRATE 100 MG PO TABS: 50.0000 mg | ORAL_TABLET | Freq: Every evening | ORAL | 6 refills | Status: DC | PRN

## 2018-09-01 MED ORDER — SACUBITRIL-VALSARTAN 97-103 MG PO TABS: 1.0000 | ORAL_TABLET | Freq: Two times a day (BID) | ORAL | 6 refills | Status: DC

## 2018-09-01 MED ORDER — SERTRALINE HCL 50 MG PO TABS: 50.0000 mg | ORAL_TABLET | Freq: Every day | ORAL | 6 refills | Status: DC

## 2018-09-01 NOTE — Progress Notes (Signed)
  Echocardiogram 2D Echocardiogram has been performed.  John [name] 09/01/2018, 1:56 PM

## 2018-09-01 NOTE — Patient Instructions (Signed)
A prescription was sent for Sildenafil 50mg  (0.5 tab) nightly as needed  INCREASE Entresto to 97/103mg  (1 tab) twice a day  INCREASE Sertraline to 50mg  (1 tab) daily  Your physician recommends that you schedule a follow-up appointment in: 3 months  A referral has been placed for Cardiac Rehab. They will contact you with an appointment time.

## 2018-09-02 ENCOUNTER — Ambulatory Visit (HOSPITAL_COMMUNITY): Payer: Self-pay

## 2018-09-02 NOTE — Progress Notes (Signed)
PCP: Dr. Nolen Mu Cardiology: Dr. Allyson Sabal HF Cardiology: Dr. Shirlee Latch  57 y.o. with history of nonischemic cardiomyopathy and OSA was referred by Dr. Allyson Sabal for evaluation of CHF.  He was initially diagnosed in early 2/18.  He had URI symptoms then developed dyspnea and put on weight. Echo was done in 2/18, showing EF 20-25%.  LHC in 2/18 showed normal coronaries. He was started on treatment for cardiomyopathy, and EF rose to 40% by 6/18. However, most recent echo in 6/19 showed EF back down to 15-20% with severe RV dilation/dysfunction.  CPX was done in 7/19, showed moderate functional limitation though submaximal.  He had St Jude ICD placed in 8/19.   Patient returns for followup of CHF.  Weight is up about 7 lbs, has been eating more around the holidays and not getting as much exercise.  Having trouble with depression and anxiety.  Feels like sertraline is not helping much at current dose.  No dyspnea except with heavy exertion.  No chest pain.  No orthopnea/PND.  Still drinking some ETOH but not heavy. BP is elevated.   Labs (7/19): K 4.9, creatinine 0.91 Labs (8/19): Na 128, K 4.9, creatinine 0.72 Labs (9/19): LDL 45 Labs (12/19): K 3.9, creatinine 0.94  PMH: 1. OSA: uses CPAP.  2. Depression 3. Chronic systolic CHF: Nonischemic cardiomyopathy.  Diagnosed around 2/18.  - LHC (2/18): Normal coronaries.  - Echo (2/18): EF 20-25% - Echo (6/18): EF 40% - Echo (12/18): EF 35-40% - Echo (6/19): LV mildly dilated, EF 15-20%, RV severely dilated with severely decreased systolic function.  - CPX (7/19): peak VO2 16, VE/VCO2 slope 37, RER 1.03 => submaximal but probably moderate functional limitation.  - Cardiac MRI (7/19): EF 29% with mild LV dilation, mildly decreased RV systolic function EF 40%, mid-wall LGE throughtout the septal wall and the inferor wall.  - St Jude ICD placed in 8/19.  - Echo (12/19): EF 25-30%, diffuse hypokinesis, normal RV size and systolic function.   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 No drugs. Lives in [place].   FH: Sister with congenital heart abnormality.  No other cardiac disease.   ROS: All systems reviewed and negative except as per HPI.  Current Outpatient Medications  Medication Sig Dispense Refill  . acetaminophen (TYLENOL) 325 MG tablet Take 650 mg by mouth every 6 (six) hours as needed for mild pain.    . carvedilol (COREG) 25 MG tablet Take 1 tablet (25 mg total) by mouth 2 (two) times daily with a meal. 60 tablet 6  . cetirizine (ZYRTEC) 10 MG chewable tablet Chew 10 mg by mouth daily.    . furosemide (LASIX) 80 MG tablet Take 1 tablet (80 mg total) by mouth every morning, then take 1/2 tablet (40mg  total) by mouth every evening. 45 tablet 3  . LORazepam (ATIVAN) 1 MG tablet Take 0.5-1 mg by mouth every 8 (eight) hours as needed for anxiety.    . Multiple Vitamins-Minerals (MULTIVITAMIN WITH MINERALS) tablet Take 1 tablet by mouth daily.    . potassium chloride SA (K-DUR,KLOR-CON) 20 MEQ tablet Take 20 mEq by mouth daily.    . rosuvastatin (CRESTOR) 20 MG tablet Take 20 mg by mouth daily.    . sertraline (ZOLOFT) 50 MG tablet Take 1 tablet (50 mg total) by mouth daily. 30 tablet 6  . sildenafil (VIAGRA) 100 MG tablet Take 0.5 tablets (50 mg total) by mouth at bedtime as needed for erectile  dysfunction. 10 tablet 6  . spironolactone (ALDACTONE) 25 MG tablet Take 1 tablet (25 mg total) by mouth daily. 30 tablet 3  . aspirin 81 MG chewable tablet Chew 1 tablet (81 mg total) by mouth daily. (Patient not taking: Reported on 09/01/2018)    . sacubitril-valsartan (ENTRESTO) 97-103 MG Take 1 tablet by mouth 2 (two) times daily. 60 tablet 6   No current facility-administered medications for this encounter.    BP (!) 160/90   Pulse (!) 113   Wt 107.3 kg (236 lb 9.6 oz)   SpO2 98%   BMI 34.44 kg/m  General: NAD Neck: No JVD, no thyromegaly or thyroid nodule.  Lungs: Clear  to auscultation bilaterally with normal respiratory effort. CV: Nondisplaced PMI.  Heart regular S1/S2, no S3/S4, no murmur.  No peripheral edema.  No carotid bruit.  Normal pedal pulses.  Abdomen: Soft, nontender, no hepatosplenomegaly, no distention.  Skin: Intact without lesions or rashes.  Neurologic: Alert and oriented x 3.  Psych: Normal affect. Extremities: No clubbing or cyanosis.  HEENT: Normal.   Assessment/Plan: 1. Chronic systolic CHF: Nonischemic cardiomyopathy with biventricular failure.  Cath in 2/18 with no coronary disease.  Echo (6/19) with EF 15-20%, severe RV dilation/dysfunction.  CPX 7/19 submaximal but probably moderate functional limitation.  Cardiac MRI in 7/19 showed EF 29%.  LGE pattern was concerning for prior viral myocarditis. Echo was done today and reviewed, EF 25-30%.  Now with St Jude ICD.  Cause of CMP is uncertain, never a very heavy drinker and no drugs.  No family history.  Possible viral cardiomyopathy. NYHA class II symptoms. Not volume overloaded on exam.  - Continue Lasix 80 qam/40 qpm.  BMET today.  - Continue spironolactone 25 mg daily.   - Continue Coreg 25 mg bid.  - Increase Entresto to 97/103 bid, BMET 10 days.   - Continue cardiac rehab.  - Close followup, may need advanced therapies down the road. Repeat echo in 12/19 to reassess LV function.  2. OSA: Continue CPAP.  3. Anxiety: Increase sertraline to 50 mg daily.  4. Erectile dysfunction: I will give him a prescription for Viagra.    Marca Ancona 09/02/2018

## 2018-09-02 NOTE — Addendum Note (Signed)
Encounter addended by: Nikki Dom, RN on: 09/02/2018 3:30 PM  Actions taken: Visit Navigator Flowsheet section accepted, Clinical Note Signed

## 2018-09-02 NOTE — Progress Notes (Signed)
Discharge Progress Report  Patient Details  Name: John [name] MRN: [id number] Date of Birth: Oct 05, 1960 Referring Provider:     CARDIAC REHAB PHASE II ORIENTATION from 07/21/2018 in [facility]ARD[facility]  Referring Provider  Laurey Morale MD        Number of Visits: 1  Reason for Discharge:  Early Exit:  Lack of attendance  Smoking History:  Social History   Tobacco Use  Smoking Status Never Smoker  Smokeless Tobacco Never Used    Diagnosis:  04/2018 Heart failure, chronic systolic (HCC)  ADL UCSD:   Initial Exercise Prescription: Initial Exercise Prescription - 07/21/18 1400      Date of Initial Exercise RX and Referring Provider   Date  07/21/18    Referring Provider  Laurey Morale MD     Expected Discharge Date  10/26/18      Bike   Level  1.5    Minutes  10    METs  3.7      NuStep   Level  2    SPM  75    Minutes  10    METs  3.5      Track   Laps  16    Minutes  10    METs  3.76      Prescription Details   Frequency (times per week)  3x    Duration  Progress to 30 minutes of continuous aerobic without signs/symptoms of physical distress      Intensity   THRR 40-80% of Max Heartrate  65-131    Ratings of Perceived Exertion  11-13    Perceived Dyspnea  0-4      Progression   Progression  Continue progressive overload as per policy without signs/symptoms or physical distress.      Resistance Training   Training Prescription  Yes    Weight  3lbs    Reps  10-15       Discharge Exercise Prescription (Final Exercise Prescription Changes):   Functional Capacity: 6 Minute Walk    Row Name 07/21/18 1410         6 Minute Walk   Phase  Initial     Distance  2025 feet     Walk Time  6 minutes     # of Rest Breaks  0     MPH  3.84     METS  4.11     RPE  12     Perceived Dyspnea   0     VO2 Peak  14.4     Symptoms  No     Resting HR  68 bpm     Resting BP  128/70     Resting Oxygen Saturation   97 %      Exercise Oxygen Saturation  during 6 min walk  96 %     Max Ex. HR  104 bpm     Max Ex. BP  132/72     2 Minute Post BP  120/70        Psychological, QOL, Others - Outcomes: PHQ 2/9: Depression screen [facility] 2/9 02/04/2018 06/23/2017  Decreased Interest 0 0  Down, Depressed, Hopeless 0 0  PHQ - 2 Score 0 0    Quality of Life: Quality of Life - 07/21/18 1355      Quality of Life   Select  Quality of Life      Quality of Life Scores   Health/Function Pre  17 %  Socioeconomic Pre  23.07 %    Psych/Spiritual Pre  17.14 %    Family Pre  27.6 %    GLOBAL Pre  19.84 %       Personal Goals: Goals established at orientation with interventions provided to work toward goal. Personal Goals and Risk Factors at Admission - 07/21/18 1400      Core Components/Risk Factors/Patient Goals on Admission   Admit Weight  231 lb 14.8 oz (105.2 kg)    Goal Weight: Long Term  221 lb (100.2 kg)        Personal Goals Discharge:   Exercise Goals and Review: Exercise Goals    Row Name 07/21/18 1424             Exercise Goals   Increase Physical Activity  Yes       Intervention  Provide advice, education, support and counseling about physical activity/exercise needs.;Develop an individualized exercise prescription for aerobic and resistive training based on initial evaluation findings, risk stratification, comorbidities and participant's personal goals.       Expected Outcomes  Short Term: Attend rehab on a regular basis to increase amount of physical activity.;Long Term: Exercising regularly at least 3-5 days a week.;Long Term: Add in home exercise to make exercise part of routine and to increase amount of physical activity.       Increase Strength and Stamina  Yes       Intervention  Provide advice, education, support and counseling about physical activity/exercise needs.;Develop an individualized exercise prescription for aerobic and resistive training based on initial evaluation  findings, risk stratification, comorbidities and participant's personal goals.       Expected Outcomes  Short Term: Increase workloads from initial exercise prescription for resistance, speed, and METs.;Short Term: Perform resistance training exercises routinely during rehab and add in resistance training at home;Long Term: Improve cardiorespiratory fitness, muscular endurance and strength as measured by increased METs and functional capacity ( )       Able to understand and use rate of perceived exertion (RPE) scale  Yes       Intervention  Provide education and explanation on how to use RPE scale       Expected Outcomes  Short Term: Able to use RPE daily in rehab to express subjective intensity level;Long Term:  Able to use RPE to guide intensity level when exercising independently       Knowledge and understanding of Target Heart Rate Range (THRR)  Yes       Intervention  Provide education and explanation of THRR including how the numbers were predicted and where they are located for reference       Expected Outcomes  Short Term: Able to state/look up THRR;Long Term: Able to use THRR to govern intensity when exercising independently;Short Term: Able to use daily as guideline for intensity in rehab       Able to check pulse independently  Yes       Intervention  Provide education and demonstration on how to check pulse in carotid and radial arteries.;Review the importance of being able to check your own pulse for safety during independent exercise       Expected Outcomes  Short Term: Able to explain why pulse checking is important during independent exercise;Long Term: Able to check pulse independently and accurately       Understanding of Exercise Prescription  Yes       Intervention  Provide education, explanation, and written materials on patient's individual exercise prescription  Expected Outcomes  Short Term: Able to explain program exercise prescription;Long Term: Able to explain home  exercise prescription to exercise independently          Exercise Goals Re-Evaluation:   Nutrition & Weight - Outcomes: Pre Biometrics - 07/21/18 1408      Pre Biometrics   Height  5' 9.5" (1.765 m)    Weight  105.2 kg    Waist Circumference  49 inches    Hip Circumference  46 inches    Waist to Hip Ratio  1.07 %    BMI (Calculated)  33.77    Triceps Skinfold  23 mm    % Body Fat  35.3 %    Grip Strength  33 kg    Flexibility  0 in    Single Leg Stand  30 seconds        Nutrition: Nutrition Therapy & Goals - 07/21/18 1516      Nutrition Therapy   Diet  heart healthy      Personal Nutrition Goals   Nutrition Goal  Pt to identify and limit food sources of saturated fat, trans fat, refined carbohydrates and sodium    Personal Goal #2  Pt to identify food quantities necessary to achieve weight loss of 6-24 lbs. at graduation from cardiac rehab.    Personal Goal #3  Pt to eat a variety of non-starchy vegetables.    Personal Goal #4  Pt to make good choices when eating out at restaurants      Intervention Plan   Intervention  Prescribe, educate and counsel regarding individualized specific dietary modifications aiming towards targeted core components such as weight, hypertension, lipid management, diabetes, heart failure and other comorbidities.    Expected Outcomes  Short Term Goal: Understand basic principles of dietary content, such as calories, fat, sodium, cholesterol and nutrients.;Long Term Goal: Adherence to prescribed nutrition plan.       Nutrition Discharge: Nutrition Assessments - 07/21/18 1517      MEDFICTS Scores   Pre Score  44       Education Questionnaire Score: Knowledge Questionnaire Score - 07/21/18 1355      Knowledge Questionnaire Score   Pre Score  23/24       Goals reviewed with patient; copy given to patient.

## 2018-09-04 ENCOUNTER — Ambulatory Visit (HOSPITAL_COMMUNITY): Payer: Self-pay

## 2018-09-04 ENCOUNTER — Other Ambulatory Visit: Payer: Self-pay | Admitting: Cardiovascular Disease

## 2018-09-07 ENCOUNTER — Ambulatory Visit (HOSPITAL_COMMUNITY): Payer: Self-pay

## 2018-09-08 ENCOUNTER — Telehealth (HOSPITAL_COMMUNITY): Payer: Self-pay | Admitting: *Deleted

## 2018-09-08 NOTE — Telephone Encounter (Signed)
Received office generated referral for pt to participate in cardiac rehab again.  Pt recently referred and scheduled in November.  Pt attended orientation and did not return for exercise due to various personal and pt identified medical issues. Message left for pt to call back for discussion on whether this program how it is structured ie class times and the expected attendance requirement.  Contact information provided. Alanson Aly, BSN Cardiac and Emergency planning/management officer

## 2018-09-11 ENCOUNTER — Ambulatory Visit (HOSPITAL_COMMUNITY): Payer: Self-pay

## 2018-09-14 ENCOUNTER — Ambulatory Visit (HOSPITAL_COMMUNITY): Payer: Self-pay

## 2018-09-17 ENCOUNTER — Telehealth (HOSPITAL_COMMUNITY): Payer: Self-pay | Admitting: *Deleted

## 2018-09-17 NOTE — Telephone Encounter (Signed)
Returning call to patient from message left earlier.  Requested a call back.  Contact information provided. Alanson Aly, BSN Cardiac and Emergency planning/management officer

## 2018-09-18 ENCOUNTER — Ambulatory Visit (HOSPITAL_COMMUNITY): Payer: Self-pay

## 2018-09-21 ENCOUNTER — Ambulatory Visit (HOSPITAL_COMMUNITY): Payer: Self-pay

## 2018-09-23 ENCOUNTER — Ambulatory Visit (HOSPITAL_COMMUNITY): Payer: Self-pay

## 2018-09-23 ENCOUNTER — Telehealth (HOSPITAL_COMMUNITY): Payer: Self-pay

## 2018-09-23 NOTE — Telephone Encounter (Signed)
Pt called to report that he is having anxiety, insomnia, diarrhea, anorexia, and rapid heart beat. I advised pt to seek his PCP due to the fact that we do not specialize in the about symptoms except rapid heart beats. Pt insisted to get seen this week because his sister-n-law is a pediatric nurse in [place] and she states he needs to be seen asap.  Per Morrie Sheldon, we are to draw labs on 01/10 along with an EKG. Pt agreed to send in transmission of his device.

## 2018-09-24 ENCOUNTER — Other Ambulatory Visit (HOSPITAL_COMMUNITY): Payer: Self-pay | Admitting: Cardiology

## 2018-09-25 ENCOUNTER — Ambulatory Visit (HOSPITAL_COMMUNITY): Payer: Self-pay

## 2018-09-25 ENCOUNTER — Encounter (HOSPITAL_COMMUNITY): Payer: Self-pay | Admitting: *Deleted

## 2018-09-25 ENCOUNTER — Ambulatory Visit (HOSPITAL_COMMUNITY)
Admission: RE | Admit: 2018-09-25 | Discharge: 2018-09-25 | Disposition: A | Discharge status: Home / Self Care | Payer: 59 | Source: Ambulatory Visit | Attending: Cardiology | Admitting: Cardiology

## 2018-09-25 ENCOUNTER — Other Ambulatory Visit (HOSPITAL_COMMUNITY): Payer: Self-pay | Admitting: *Deleted

## 2018-09-25 VITALS — BP 115/72 | HR 62 | Wt 235.8 lb

## 2018-09-25 DIAGNOSIS — I428 Other cardiomyopathies: Secondary | ICD-10-CM | POA: Insufficient documentation

## 2018-09-25 DIAGNOSIS — I5022 Chronic systolic (congestive) heart failure: Secondary | ICD-10-CM | POA: Diagnosis not present

## 2018-09-25 DIAGNOSIS — I429 Cardiomyopathy, unspecified: Secondary | ICD-10-CM

## 2018-09-25 LAB — BASIC METABOLIC PANEL
ANION GAP: 10 (ref 5–15)
BUN: 11 mg/dL (ref 6–20)
CO2: 24 mmol/L (ref 22–32)
Calcium: 9.7 mg/dL (ref 8.9–10.3)
Chloride: 102 mmol/L (ref 98–111)
Creatinine, Ser: 0.94 mg/dL (ref 0.61–1.24)
GFR calc Af Amer: >60 mL/min (ref >60)
GFR calc non Af Amer: >60 mL/min (ref >60)
Glucose, Bld: 109 mg/dL — ABNORMAL HIGH (ref 70–99)
Potassium: 4.5 mmol/L (ref 3.5–5.1)
Sodium: 136 mmol/L (ref 135–145)

## 2018-09-28 ENCOUNTER — Ambulatory Visit (HOSPITAL_COMMUNITY): Payer: Self-pay

## 2018-09-30 ENCOUNTER — Ambulatory Visit (HOSPITAL_COMMUNITY): Payer: Self-pay

## 2018-10-02 ENCOUNTER — Ambulatory Visit (HOSPITAL_COMMUNITY): Payer: Self-pay

## 2018-10-05 ENCOUNTER — Ambulatory Visit (HOSPITAL_COMMUNITY): Payer: Self-pay

## 2018-10-07 ENCOUNTER — Ambulatory Visit (HOSPITAL_COMMUNITY): Payer: Self-pay

## 2018-10-07 ENCOUNTER — Other Ambulatory Visit (HOSPITAL_COMMUNITY): Payer: Self-pay | Admitting: Cardiology

## 2018-10-08 ENCOUNTER — Encounter (HOSPITAL_COMMUNITY): Payer: Self-pay

## 2018-10-08 ENCOUNTER — Telehealth (HOSPITAL_COMMUNITY): Payer: Self-pay

## 2018-10-08 ENCOUNTER — Telehealth (HOSPITAL_COMMUNITY): Payer: Self-pay | Admitting: Pharmacy Technician

## 2018-10-08 NOTE — Telephone Encounter (Signed)
Attempted to contact pt insurance for coverage and benefits, had to leave a voicemail. Awaiting phone call back.

## 2018-10-08 NOTE — Telephone Encounter (Signed)
Cardiac Rehab Medication Review by a Pharmacist  Does the patient  feel that his/her medications are working for him/her?  yes  Has the patient been experiencing any side effects to the medications prescribed?  no  Does the patient measure his/her own blood pressure or blood glucose at home?  no   Does the patient have any problems obtaining medications due to transportation or finances?   no  Understanding of regimen: fair Understanding of indications: fair Potential of compliance: fair  Pharmacist comments: no issues with medications    Harlow Mares, PharmD PGY1 Pharmacy Resident Phone [phone]  10/08/2018   2:26 PM

## 2018-10-09 ENCOUNTER — Other Ambulatory Visit: Payer: Self-pay | Admitting: Student

## 2018-10-09 ENCOUNTER — Ambulatory Visit (HOSPITAL_COMMUNITY): Payer: Self-pay

## 2018-10-09 ENCOUNTER — Other Ambulatory Visit: Payer: Self-pay | Admitting: Cardiovascular Disease

## 2018-10-12 ENCOUNTER — Telehealth (HOSPITAL_COMMUNITY): Payer: Self-pay

## 2018-10-12 ENCOUNTER — Telehealth (HOSPITAL_COMMUNITY): Payer: Self-pay | Admitting: Cardiac Rehabilitation

## 2018-10-12 ENCOUNTER — Ambulatory Visit (HOSPITAL_COMMUNITY): Payer: Self-pay

## 2018-10-12 NOTE — Telephone Encounter (Signed)
Attempt to contact pt for CRPII RN interview.  NA, no voicemail available.  Deveron Furlong, RN, BSN Cardiac Pulmonary Rehab

## 2018-10-12 NOTE — Telephone Encounter (Signed)
Pt insurance is active and benefits verified through [employer]. Co-pay $0.00, DED $700.00/$0.00 met, out of pocket $3,500.00/$3.39 met, co-insurance 20%. No pre-authorization required. [employer], 10/08/2018

## 2018-10-12 NOTE — Progress Notes (Signed)
Kylynn Swartzentruber 58 y.o. male DOB 1960-10-24 MRN [id number]       Nutrition Screener Note  No diagnosis found. Past Medical History:  Diagnosis Date  . Anxiety    Ativan  . Chronic combined systolic and diastolic CHF, NYHA class 3 (HCC) 10/2016   Nonischemic cardiomyopathy. EF 20-25%.  . Chronic left hip pain   . Depression    history of  . GI bleed 03/2016  . Headache   . Hypertensive heart disease with combined systolic and diastolic congestive heart failure (HCC) 10/2016  . Nonischemic cardiomyopathy (HCC) 10/2016   Echo with EF 20-25%. Cardiac catheterization with no CAD. LVEDP was 41 mmHg, PCWP 36 mmHg  . Osteoarthritis    right shoulder  . Right hand fracture   . Seasonal allergies   . Sleep apnea    wears a CPAP  . Wears glasses    Meds reviewed.     Current Outpatient Medications (Cardiovascular):  .  carvedilol (COREG) 25 MG tablet, TAKE 1 TABLET (25 MG TOTAL) BY MOUTH 2 (TWO) TIMES DAILY WITH A MEAL. .  furosemide (LASIX) 40 MG tablet, TAKE 1 TABLET BY MOUTH TWICE A DAY .  furosemide (LASIX) 80 MG tablet, Take 1 tablet (80 mg total) by mouth every morning, then take 1/2 tablet (40mg  total) by mouth every evening. (Patient not taking: Reported on 10/08/2018) .  rosuvastatin (CRESTOR) 20 MG tablet, Take 20 mg by mouth daily. .  sacubitril-valsartan (ENTRESTO) 97-103 MG, Take 1 tablet by mouth 2 (two) times daily. .  sildenafil (VIAGRA) 100 MG tablet, Take 0.5 tablets (50 mg total) by mouth at bedtime as needed for erectile dysfunction. Marland Kitchen  spironolactone (ALDACTONE) 25 MG tablet, Take 1 tablet (25 mg total) by mouth daily.  Current Outpatient Medications (Respiratory):  .  cetirizine (ZYRTEC) 10 MG chewable tablet, Chew 10 mg by mouth daily.  Current Outpatient Medications (Analgesics):  .  acetaminophen (TYLENOL) 325 MG tablet, Take 650 mg by mouth every 6 (six) hours as needed for mild pain.   Current Outpatient Medications (Other):  .  KLOR-CON M20 20 MEQ  tablet, TAKE 1 TABLET (20 MEQ TOTAL) BY MOUTH DAILY. Marland Kitchen  LORazepam (ATIVAN) 1 MG tablet, Take 0.5-1 mg by mouth every 8 (eight) hours as needed for anxiety. .  Multiple Vitamins-Minerals (MULTIVITAMIN WITH MINERALS) tablet, Take 1 tablet by mouth daily. .  sertraline (ZOLOFT) 50 MG tablet, TAKE 1 TABLET BY MOUTH EVERY DAY   HT: Ht Readings from Last 1 Encounters:  07/21/18 5' 9.5" (1.765 m)    WT: Wt Readings from Last 5 Encounters:  09/25/18 235 lb 12.8 oz (107 kg)  09/01/18 236 lb 9.6 oz (107.3 kg)  08/24/18 229 lb 9.6 oz (104.1 kg)  07/21/18 231 lb 14.8 oz (105.2 kg)  06/11/18 240 lb 12.8 oz (109.2 kg)     BMI = 34.07 05/27/18  Current tobacco use? No       Labs:  Lipid Panel     Component Value Date/Time   CHOL 111 06/09/2018 1137   TRIG 66 06/09/2018 1137   HDL 53 06/09/2018 1137   CHOLHDL 2.1 06/09/2018 1137   LDLCALC 45 06/09/2018 1137    No results found for: HGBA1C CBG (last 3)  No results for input(s): GLUCAP in the last 72 hours.  Nutrition Diagnosis ? Food-and nutrition-related knowledge deficit related to lack of exposure to information as related to diagnosis of: ? CVD  ? Obese  I = 30-34.9 related to excessive energy intake  as evidenced by a BMI = 34.07  Nutrition Goal(s):  ? To be determined  Plan:  Pt to attend nutrition classes ? Nutrition I ? Nutrition II ? Portion Distortion  Will provide client-centered nutrition education as part of interdisciplinary care.   Monitor and evaluate progress toward nutrition goal with team.  Ross Marcus, MS, RD, LDN 10/12/2018 11:19 AM

## 2018-10-13 ENCOUNTER — Inpatient Hospital Stay (HOSPITAL_COMMUNITY)
Admission: RE | Admit: 2018-10-13 | Discharge: 2018-10-13 | Disposition: A | Discharge status: Home / Self Care | Payer: Self-pay | Source: Ambulatory Visit

## 2018-10-13 ENCOUNTER — Telehealth (HOSPITAL_COMMUNITY): Payer: Self-pay | Admitting: Internal Medicine

## 2018-10-14 ENCOUNTER — Ambulatory Visit (HOSPITAL_COMMUNITY): Payer: Self-pay

## 2018-10-16 ENCOUNTER — Ambulatory Visit (HOSPITAL_COMMUNITY): Payer: Self-pay

## 2018-10-19 ENCOUNTER — Ambulatory Visit (HOSPITAL_COMMUNITY): Payer: Self-pay

## 2018-10-21 ENCOUNTER — Ambulatory Visit (HOSPITAL_COMMUNITY): Payer: Self-pay

## 2018-10-21 ENCOUNTER — Encounter: Payer: Self-pay | Admitting: Cardiovascular Disease

## 2018-10-23 ENCOUNTER — Ambulatory Visit (HOSPITAL_COMMUNITY): Payer: Self-pay

## 2018-10-26 ENCOUNTER — Ambulatory Visit (HOSPITAL_COMMUNITY): Payer: Self-pay

## 2018-10-26 ENCOUNTER — Telehealth (HOSPITAL_COMMUNITY): Payer: Self-pay

## 2018-10-26 NOTE — Telephone Encounter (Signed)
Called and spoke with pt in regards to rescheduling CR, pt will come in for orientation on 11/17/2018 @ 830AM and attend the 245 class time.  Mailed homework package.

## 2018-10-27 ENCOUNTER — Ambulatory Visit (HOSPITAL_COMMUNITY): Payer: Self-pay

## 2018-10-28 ENCOUNTER — Other Ambulatory Visit: Payer: Self-pay

## 2018-10-28 ENCOUNTER — Encounter: Payer: Self-pay | Admitting: Cardiovascular Disease

## 2018-10-28 ENCOUNTER — Ambulatory Visit (INDEPENDENT_AMBULATORY_CARE_PROVIDER_SITE_OTHER): Payer: 59 | Admitting: Cardiovascular Disease

## 2018-10-28 ENCOUNTER — Ambulatory Visit (HOSPITAL_COMMUNITY): Payer: Self-pay

## 2018-10-28 VITALS — BP 130/84 | HR 54 | Ht 69.5 in

## 2018-10-28 DIAGNOSIS — Z4502 Encounter for adjustment and management of automatic implantable cardiac defibrillator: Secondary | ICD-10-CM

## 2018-10-28 DIAGNOSIS — I5042 Chronic combined systolic (congestive) and diastolic (congestive) heart failure: Secondary | ICD-10-CM

## 2018-10-28 DIAGNOSIS — I4729 Other ventricular tachycardia: Secondary | ICD-10-CM

## 2018-10-28 DIAGNOSIS — I472 Ventricular tachycardia: Secondary | ICD-10-CM

## 2018-10-28 DIAGNOSIS — I428 Other cardiomyopathies: Secondary | ICD-10-CM

## 2018-10-28 NOTE — Progress Notes (Signed)
Cardiology Office Note:    Date:  10/29/2018   ID:  John [name], DOB 10/09/1960, MRN [id number]  PCP:  Margaree Mackintosh, MD  Cardiologist:  Darrel Reach (CHF)  Referring MD: Margaree Mackintosh, MD   Chief Complaint  Patient presents with  . Congestive Heart Failure  Discuss ICD  History of Present Illness:    John [name] is a 58 y.o. male with a hx of severe nonischemic cardiomyopathy with left ventricular ejection fraction most recently estimated to be 15-20%.  Returns in follow-up after successful implantation of a single-chamber [employer] ellipse defibrillator for primary prevention.    Device function is normal.  Estimated generator longevity is 7-8 years.  Does not require ventricular pacing.  Excellent lead parameters.  The corevue thoracic impedance has been stable.  The device recorded a 30 beat episode of nonsustained ventricular tachycardia with gradual "warm up" and clearly different EGM from baseline, peaking at 180 bpm.  It occurred early this morning; it is the only episode of VT since device implantation.  He does not remember feeling the palpitations and did not have syncope or presyncope, but he remembers lying in bed, unable to sleep and very anxious.  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  Taking the furosemide makes him stop frequently for restrooms.  He feels exhausted at the end of the day.  He wonders whether there might be a way to limit his hours or travel time based on his medical condition.  Overall appears to be in NYHA functional class 2-3.  Denies leg edema.  He has not required any recent adjustments in his diuretic dose and appears to be hemodynamically compensated.  He is on maximum dose of Entresto as well as spironolactone and maximum usual dose of carvedilol.  He does not have a history of palpitations, dizziness, syncope and does not have angina  pectoris.  He denies edema, claudication, focal neurological complaints.  He uses CPAP for obstructive sleep apnea and denies daytime hypersomnolence.  Reports good compliance with the therapy.  He has never had atrial fibrillation.  He does not have problems with bradycardia despite high-dose carvedilol.  Past Medical History:  Diagnosis Date  . Anxiety    Ativan  . Chronic combined systolic and diastolic CHF, NYHA class 3 (HCC) 10/2016   Nonischemic cardiomyopathy. EF 20-25%.  . Chronic left hip pain   . Depression    history of  . GI bleed 03/2016  . Headache   . Hypertensive heart disease with combined systolic and diastolic congestive heart failure (HCC) 10/2016  . Nonischemic cardiomyopathy (HCC) 10/2016   Echo with EF 20-25%. Cardiac catheterization with no CAD. LVEDP was 41 mmHg, PCWP 36 mmHg  . Osteoarthritis    right shoulder  . Right hand fracture   . Seasonal allergies   . Sleep apnea    wears a CPAP  . Wears glasses     Past Surgical History:  Procedure Laterality Date  . CARDIAC CATHETERIZATION    . ESOPHAGOGASTRODUODENOSCOPY (EGD) WITH PROPOFOL N/A 03/20/2016   Procedure: ESOPHAGOGASTRODUODENOSCOPY (EGD) WITH PROPOFOL;  Surgeon: Charlott Rakes, MD;  Location: [facility] ENDOSCOPY;  Service: Endoscopy;  Laterality: N/A;  . ICD IMPLANT N/A 05/06/2018   Procedure: ICD IMPLANT;  Surgeon: Thurmon Fair, MD;  Location: [facility] INVASIVE CV LAB;  Service: Cardiovascular;  Laterality: N/A;  . RIGHT/LEFT HEART CATH AND CORONARY ANGIOGRAPHY N/A 10/21/2016   Procedure:  Right/Left Heart Cath and Coronary Angiography;  Surgeon: Runell Gess, MD;  Location: [facility] INVASIVE CV LAB;  Service: Cardiovascular: Angiographically normal coronary arteries. PCWP 33-36 mmHg, LVEDP 41 mmHg. PA pressure 60/35, mean 46 mmHg.  Cardiac output/cardiac index-3.93 /1.76 (Fick), 3.49/1.57 (thermodilution)  . TOTAL HIP ARTHROPLASTY Left 03/27/2015   Procedure: LEFT TOTAL HIP ARTHROPLASTY ANTERIOR APPROACH;   Surgeon: Samson Frederic, MD;  Location: [facility] OR;  Service: Orthopedics;  Laterality: Left;  . TOTAL SHOULDER ARTHROPLASTY Right 11/24/2015   Procedure: RIGHT TOTAL SHOULDER ARTHROPLASTY;  Surgeon: Beverely Low, MD;  Location: [facility] OR;  Service: Orthopedics;  Laterality: Right;  . TRANSTHORACIC ECHOCARDIOGRAM  10/2016   EF 20-25%. Diffuse hypokinesis but akinesis of the entire inferoseptal wall and apical wall. Moderate biatrial enlargement. PA pressure estimated 64 mmHg.  . WISDOM TOOTH EXTRACTION      Current Medications: Current Meds  Medication Sig  . acetaminophen (TYLENOL) 325 MG tablet Take 650 mg by mouth every 6 (six) hours as needed for mild pain.  . carvedilol (COREG) 25 MG tablet TAKE 1 TABLET (25 MG TOTAL) BY MOUTH 2 (TWO) TIMES DAILY WITH A MEAL.  . cetirizine (ZYRTEC) 10 MG chewable tablet Chew 10 mg by mouth daily.  . furosemide (LASIX) 40 MG tablet TAKE 1 TABLET BY MOUTH TWICE A DAY  . KLOR-CON M20 20 MEQ tablet TAKE 1 TABLET (20 MEQ TOTAL) BY MOUTH DAILY.  Marland Kitchen LORazepam (ATIVAN) 1 MG tablet Take 0.5-1 mg by mouth every 8 (eight) hours as needed for anxiety.  . Multiple Vitamins-Minerals (MULTIVITAMIN WITH MINERALS) tablet Take 1 tablet by mouth daily.  . rosuvastatin (CRESTOR) 20 MG tablet Take 20 mg by mouth daily.  . sacubitril-valsartan (ENTRESTO) 97-103 MG Take 1 tablet by mouth 2 (two) times daily.  . sertraline (ZOLOFT) 50 MG tablet TAKE 1 TABLET BY MOUTH EVERY DAY  . sildenafil (VIAGRA) 100 MG tablet Take 0.5 tablets (50 mg total) by mouth at bedtime as needed for erectile dysfunction.  Marland Kitchen spironolactone (ALDACTONE) 25 MG tablet Take 1 tablet (25 mg total) by mouth daily.     Allergies:   Patient has no known allergies.   Social History   Socioeconomic History  . Marital status: Divorced    Spouse name: Not on file  . Number of children: Not on file  . Years of education: Not on file  . Highest education level: Not on file  Occupational History  . Not on file    Social Needs  . Financial resource strain: Not on file  . Food insecurity:    Worry: Not on file    Inability: Not on file  . Transportation needs:    Medical: Not on file    Non-medical: Not on file  Tobacco Use  . Smoking status: Never Smoker  . Smokeless tobacco: Never Used  Substance and Sexual Activity  . Alcohol use: Yes    Alcohol/week: 3.0 standard drinks    Types: 3 Cans of beer per week    Comment: occasional  . Drug use: No  . Sexual activity: Not on file  Lifestyle  . Physical activity:    Days per week: Not on file    Minutes per session: Not on file  . Stress: Not on file  Relationships  . Social connections:    Talks on phone: Not on file    Gets together: Not on file    Attends religious service: Not on file    Active member of club or organization: Not  on file    Attends meetings of clubs or organizations: Not on file    Relationship status: Not on file  Other Topics Concern  . Not on file  Social History Narrative  . Not on file     Family History: The patient's family history includes Congenital heart disease in his sister.  His son also had a "hole in his heart" there was repaired surgically.  ROS:   Please see the history of present illness.    All other systems are reviewed and are negative  EKGs/Labs/Other Studies Reviewed:    The following studies were reviewed today: Notes from last heart failure visit with Dr. Ronelle Nigh  EKG:  EKG is ordered today.  It shows sinus rhythm with PVCs, nonspecific ST segment changes in leads I, aVL, V6, borderline QTc interval 467 ms  Recent Labs: 06/09/2018: ALT 14 08/24/2018: Hemoglobin 13.2; Magnesium 2.1; Platelets 282 09/25/2018: BUN 11; Creatinine, Ser 0.94; Potassium 4.5; Sodium 136  Recent Lipid Panel    Component Value Date/Time   CHOL 111 06/09/2018 1137   TRIG 66 06/09/2018 1137   HDL 53 06/09/2018 1137   CHOLHDL 2.1 06/09/2018 1137   LDLCALC 45 06/09/2018 1137    Physical Exam:    VS:   BP 130/84   Pulse (!) 54   Ht 5' 9.5" (1.765 m)   SpO2 97%   BMI 34.32 kg/m     Wt Readings from Last 3 Encounters:  09/25/18 235 lb 12.8 oz (107 kg)  09/01/18 236 lb 9.6 oz (107.3 kg)  08/24/18 229 lb 9.6 oz (104.1 kg)      General: Alert, oriented x3, no distress, mildly obese.  Well-healed left subclavian fibrillator site Head: no evidence of trauma, PERRL, EOMI, no exophtalmos or lid lag, no myxedema, no xanthelasma; normal ears, nose and oropharynx Neck: normal jugular venous pulsations and no hepatojugular reflux; brisk carotid pulses without delay and no carotid bruits Chest: clear to auscultation, no signs of consolidation by percussion or palpation, normal fremitus, symmetrical and full respiratory excursions Cardiovascular: normal position and quality of the apical impulse, regular rhythm, normal first and second heart sounds, no murmurs, rubs or gallops Abdomen: no tenderness or distention, no masses by palpation, no abnormal pulsatility or arterial bruits, normal bowel sounds, no hepatosplenomegaly Extremities: no clubbing, cyanosis or edema; 2+ radial, ulnar and brachial pulses bilaterally; 2+ right femoral, posterior tibial and dorsalis pedis pulses; 2+ left femoral, posterior tibial and dorsalis pedis pulses; no subclavian or femoral bruits Neurological: grossly nonfocal Psych: Normal mood and affect   ASSESSMENT:    1. Chronic combined systolic (congestive) and diastolic (congestive) heart failure (HCC)   2. Nonischemic cardiomyopathy (HCC)   3. ICD (implantable cardioverter-defibrillator) battery depletion   4. NSVT (nonsustained ventricular tachycardia) (HCC)    PLAN:    In order of problems listed above:  1. CHF: Remains NYHA functional status class II-III.  He is on comprehensive medical therapy for his cardiomyopathy on maximal doses of Entresto and carvedilol as well as spironolactone.  He still requires a fairly hefty dose of loop diuretic to keep euvolemia,  but has not had any recent episodes of exacerbation.  I share his concern with his health and safety with a very intense work schedule.  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  He does not want to go on full disability, wants to continue to be productive.  Tells me  that his direct supervisor is very supportive.  I promised to convey his concerns to Dr. Shirlee LatchMcLean and Dr. Allyson SabalBerry who are more involved with his direct clinical care. 2. ICD: The site has healed very nicely.  Normal device function.  Remote downloads every 3 months and yearly office visits. 3. NSVT: Episode was relatively lengthy, 30 beats, but not symptomatic.  It was too slow to lead to device therapies and too brief.  It was associated with a period of intense adrenergic state due to rest related to his job requirements.  No need for device reprogramming.  Recently performed labs showed a potassium of 4.5.  He does not appear to be in acute heart failure.  Took the opportunity to review his "shock plan".   Medication Adjustments/Labs and Tests Ordered: Current medicines are reviewed at length with the patient today.  Concerns regarding medicines are outlined above.  No orders of the defined types were placed in this encounter.  No orders of the defined types were placed in this encounter.   Patient Instructions  Medication Instructions:  Your physician recommends that you continue on your current medications as directed. Please refer to the Current Medication list given to you today.  If you need a refill on your cardiac medications before your next appointment, please call your pharmacy.   Lab work: NONE  Testing/Procedures: NONE  Follow-Up: At [facility], you and your health needs are our priority.  As part of our continuing mission to provide you with exceptional heart care, we have created designated Provider Care Teams.  These Care  Teams include your primary Cardiologist (physician) and Advanced Practice Providers (APPs -  Physician Assistants and Nurse Practitioners) who all work together to provide you with the care you need, when you need it. You will need a follow up appointment in 12 months.  Please call our office 2 months in advance to schedule this appointment.  You may see DR Royann ShiversROITORU or one of the following Advanced Practice Providers on your designated Care Team: Azalee CourseHao Meng, [place] . Micah FlesherAngela Duke, PA-C  Any Other Special Instructions Will Be Listed Below (If Applicable).  [facility]     Signed, Thurmon FairMihai Kelani Robart, MD  10/29/2018 4:42 PM    [facility]

## 2018-10-28 NOTE — Patient Instructions (Addendum)
Medication Instructions:  Your physician recommends that you continue on your current medications as directed. Please refer to the Current Medication list given to you today.  If you need a refill on your cardiac medications before your next appointment, please call your pharmacy.   Lab work: NONE  Testing/Procedures: NONE  Follow-Up: At [facility], you and your health needs are our priority.  As part of our continuing mission to provide you with exceptional heart care, we have created designated Provider Care Teams.  These Care Teams include your primary Cardiologist (physician) and Advanced Practice Providers (APPs -  Physician Assistants and Nurse Practitioners) who all work together to provide you with the care you need, when you need it. You will need a follow up appointment in 12 months.  Please call our office 2 months in advance to schedule this appointment.  You may see DR Royann Shivers or one of the following Advanced Practice Providers on your designated Care Team: Azalee Course, [place] . Micah Flesher, PA-C  Any Other Special Instructions Will Be Listed Below (If Applicable).  WILL ARRANGE FOR HOME PACER CHECKS

## 2018-10-29 ENCOUNTER — Encounter: Payer: Self-pay | Admitting: Cardiovascular Disease

## 2018-10-30 ENCOUNTER — Ambulatory Visit (HOSPITAL_COMMUNITY): Payer: Self-pay

## 2018-11-02 ENCOUNTER — Ambulatory Visit (HOSPITAL_COMMUNITY): Payer: Self-pay

## 2018-11-03 LAB — CUP PACEART INCLINIC DEVICE CHECK
Date Time Interrogation Session: 20200218153809
Implantable Lead Location: 753860
Implantable Lead Model: 7122
MDC IDC LEAD IMPLANT DT: 20190821
MDC IDC PG IMPLANT DT: 20190821
Pulse Gen Serial Number: 9824800

## 2018-11-04 ENCOUNTER — Ambulatory Visit (HOSPITAL_COMMUNITY): Payer: Self-pay

## 2018-11-04 DIAGNOSIS — M25512 Pain in left shoulder: Secondary | ICD-10-CM | POA: Insufficient documentation

## 2018-11-06 ENCOUNTER — Ambulatory Visit (HOSPITAL_COMMUNITY): Payer: Self-pay

## 2018-11-09 ENCOUNTER — Ambulatory Visit (HOSPITAL_COMMUNITY): Payer: Self-pay

## 2018-11-10 ENCOUNTER — Telehealth (HOSPITAL_COMMUNITY): Payer: Self-pay | Admitting: Pharmacist

## 2018-11-10 NOTE — Telephone Encounter (Signed)
Cardiac Rehab Medication Review by a Pharmacist  Does the patient  feel that his/her medications are working for him/her?  yes  Has the patient been experiencing any side effects to the medications prescribed?  no  Does the patient measure his/her own blood pressure or blood glucose at home?  yes   Does the patient have any problems obtaining medications due to transportation or finances?   yes  Understanding of regimen: good Understanding of indications: good Potential of compliance: good    Pharmacist comments: Patient reports he does have difficulty getting his medications since he is not able to drive due to his shoulder injury, however, nephew is able to pick up prescriptions for him. I reviewed patient's medication list, however, patient reports he will bring his updated medication list with him to his appointment for any missed medications.     Gwynneth Albright, Ilda Basset D PGY1 Pharmacy Resident  Phone [phone] 11/10/2018   2:55 PM

## 2018-11-11 ENCOUNTER — Ambulatory Visit (HOSPITAL_COMMUNITY): Payer: Self-pay

## 2018-11-13 ENCOUNTER — Ambulatory Visit (HOSPITAL_COMMUNITY): Payer: Self-pay

## 2018-11-13 NOTE — Progress Notes (Signed)
Cedar Nardiello 58 y.o. male DOB 10/10/1960 MRN [id number]       Nutrition Screen Note  No diagnosis found. Past Medical History:  Diagnosis Date  . Anxiety    Ativan  . Chronic combined systolic and diastolic CHF, NYHA class 3 (HCC) 10/2016   Nonischemic cardiomyopathy. EF 20-25%.  . Chronic left hip pain   . Depression    history of  . GI bleed 03/2016  . Headache   . Hypertensive heart disease with combined systolic and diastolic congestive heart failure (HCC) 10/2016  . Nonischemic cardiomyopathy (HCC) 10/2016   Echo with EF 20-25%. Cardiac catheterization with no CAD. LVEDP was 41 mmHg, PCWP 36 mmHg  . Osteoarthritis    right shoulder  . Right hand fracture   . Seasonal allergies   . Sleep apnea    wears a CPAP  . Wears glasses    Meds reviewed.     Current Outpatient Medications (Cardiovascular):  .  carvedilol (COREG) 25 MG tablet, TAKE 1 TABLET (25 MG TOTAL) BY MOUTH 2 (TWO) TIMES DAILY WITH A MEAL. .  furosemide (LASIX) 40 MG tablet, TAKE 1 TABLET BY MOUTH TWICE A DAY .  furosemide (LASIX) 80 MG tablet, Take 1 tablet (80 mg total) by mouth every morning, then take 1/2 tablet (40mg  total) by mouth every evening. (Patient not taking: Reported on 10/28/2018) .  rosuvastatin (CRESTOR) 20 MG tablet, Take 20 mg by mouth daily. .  sacubitril-valsartan (ENTRESTO) 97-103 MG, Take 1 tablet by mouth 2 (two) times daily. .  sildenafil (VIAGRA) 100 MG tablet, Take 0.5 tablets (50 mg total) by mouth at bedtime as needed for erectile dysfunction. Marland Kitchen  spironolactone (ALDACTONE) 25 MG tablet, Take 1 tablet (25 mg total) by mouth daily.  Current Outpatient Medications (Respiratory):  .  cetirizine (ZYRTEC) 10 MG chewable tablet, Chew 10 mg by mouth daily.  Current Outpatient Medications (Analgesics):  .  acetaminophen (TYLENOL) 325 MG tablet, Take 650 mg by mouth every 6 (six) hours as needed for mild pain.   Current Outpatient Medications (Other):  .  KLOR-CON M20 20 MEQ tablet,  TAKE 1 TABLET (20 MEQ TOTAL) BY MOUTH DAILY. Marland Kitchen  LORazepam (ATIVAN) 1 MG tablet, Take 0.5-1 mg by mouth every 8 (eight) hours as needed for anxiety. .  Multiple Vitamins-Minerals (MULTIVITAMIN WITH MINERALS) tablet, Take 1 tablet by mouth daily. .  sertraline (ZOLOFT) 50 MG tablet, TAKE 1 TABLET BY MOUTH EVERY DAY   HT: Ht Readings from Last 1 Encounters:  10/28/18 5' 9.5" (1.765 m)    WT: Wt Readings from Last 5 Encounters:  09/25/18 235 lb 12.8 oz (107 kg)  09/01/18 236 lb 9.6 oz (107.3 kg)  08/24/18 229 lb 9.6 oz (104.1 kg)  07/21/18 231 lb 14.8 oz (105.2 kg)  06/11/18 240 lb 12.8 oz (109.2 kg)     BMI = 34.32  Current tobacco use? No       Labs:  Lipid Panel     Component Value Date/Time   CHOL 111 06/09/2018 1137   TRIG 66 06/09/2018 1137   HDL 53 06/09/2018 1137   CHOLHDL 2.1 06/09/2018 1137   LDLCALC 45 06/09/2018 1137    No results found for: HGBA1C CBG (last 3)  No results for input(s): GLUCAP in the last 72 hours.  Nutrition Diagnosis ? Food-and nutrition-related knowledge deficit related to lack of exposure to information as related to diagnosis of: ? CVD ? Obese  I = 30-34.9 related to excessive energy intake as evidenced  by a 34.07    Nutrition Goal(s):  ? To be determined  Plan:  Pt to attend nutrition classes ? Nutrition I ? Nutrition II ? Portion Distortion  Will provide client-centered nutrition education as part of interdisciplinary care.   Monitor and evaluate progress toward nutrition goal with team.  Ross Marcus, MS, RD, LDN 11/13/2018 11:40 AM

## 2018-11-16 ENCOUNTER — Ambulatory Visit (HOSPITAL_COMMUNITY): Payer: Self-pay

## 2018-11-17 ENCOUNTER — Encounter (HOSPITAL_COMMUNITY)
Admission: RE | Admit: 2018-11-17 | Discharge: 2018-11-17 | Disposition: A | Discharge status: Home / Self Care | Payer: 59 | Source: Ambulatory Visit | Attending: Cardiology | Admitting: Cardiology

## 2018-11-17 ENCOUNTER — Encounter (HOSPITAL_COMMUNITY): Payer: Self-pay

## 2018-11-17 VITALS — BP 124/80 | HR 78 | Ht 70.0 in | Wt 244.9 lb

## 2018-11-17 DIAGNOSIS — I5022 Chronic systolic (congestive) heart failure: Secondary | ICD-10-CM

## 2018-11-17 NOTE — Progress Notes (Signed)
Cardiac Individual Treatment Plan  Patient Details  Name: John [name] MRN: [id number] Date of Birth: [birth year] Referring Provider:   Flowsheet Row CARDIAC REHAB PHASE II ORIENTATION from 11/17/2018 in [facility] CARDIAC REHAB  Referring Provider  Marca Ancona, MD      Initial Encounter Date:  Flowsheet Row CARDIAC REHAB PHASE II ORIENTATION from 11/17/2018 in [facility] CARDIAC REHAB  Date  11/17/18      Visit Diagnosis: 2020 Systolic heart failure  Patient's Home Medications on Admission:  Current Outpatient Medications:  .  acetaminophen (TYLENOL) 325 MG tablet, Take 650 mg by mouth every 6 (six) hours as needed for mild pain., Disp: , Rfl:  .  carvedilol (COREG) 25 MG tablet, TAKE 1 TABLET (25 MG TOTAL) BY MOUTH 2 (TWO) TIMES DAILY WITH A MEAL., Disp: 60 tablet, Rfl: 6 .  cetirizine (ZYRTEC) 10 MG chewable tablet, Chew 10 mg by mouth daily., Disp: , Rfl:  .  furosemide (LASIX) 40 MG tablet, TAKE 1 TABLET BY MOUTH TWICE A DAY, Disp: 60 tablet, Rfl: 5 .  furosemide (LASIX) 80 MG tablet, Take 1 tablet (80 mg total) by mouth every morning, then take 1/2 tablet (40mg  total) by mouth every evening. (Patient not taking: Reported on 10/28/2018), Disp: 45 tablet, Rfl: 3 .  KLOR-CON M20 20 MEQ tablet, TAKE 1 TABLET (20 MEQ TOTAL) BY MOUTH DAILY., Disp: 30 tablet, Rfl: 5 .  LORazepam (ATIVAN) 1 MG tablet, Take 0.5-1 mg by mouth every 8 (eight) hours as needed for anxiety., Disp: , Rfl:  .  Multiple Vitamins-Minerals (MULTIVITAMIN WITH MINERALS) tablet, Take 1 tablet by mouth daily., Disp: , Rfl:  .  rosuvastatin (CRESTOR) 20 MG tablet, Take 20 mg by mouth daily., Disp: , Rfl:  .  sacubitril-valsartan (ENTRESTO) 97-103 MG, Take 1 tablet by mouth 2 (two) times daily., Disp: 60 tablet, Rfl: 6 .  sertraline (ZOLOFT) 50 MG tablet, TAKE 1 TABLET BY MOUTH EVERY DAY, Disp: 90 tablet, Rfl: 3 .  sildenafil (VIAGRA) 100 MG tablet, Take 0.5 tablets (50 mg total) by  mouth at bedtime as needed for erectile dysfunction., Disp: 10 tablet, Rfl: 6 .  spironolactone (ALDACTONE) 25 MG tablet, Take 1 tablet (25 mg total) by mouth daily., Disp: 30 tablet, Rfl: 3  Past Medical History: Past Medical History:  Diagnosis Date  . Anxiety    Ativan  . Chronic combined systolic and diastolic CHF, NYHA class 3 (HCC) 10/2016   Nonischemic cardiomyopathy. EF 20-25%.  . Chronic left hip pain   . Depression    history of  . GI bleed 03/2016  . Headache   . Hypertensive heart disease with combined systolic and diastolic congestive heart failure (HCC) 10/2016  . Nonischemic cardiomyopathy (HCC) 10/2016   Echo with EF 20-25%. Cardiac catheterization with no CAD. LVEDP was 41 mmHg, PCWP 36 mmHg  . Osteoarthritis    right shoulder  . Right hand fracture   . Seasonal allergies   . Sleep apnea    wears a CPAP  . Wears glasses     Tobacco Use: Social History   Tobacco Use  Smoking Status Never Smoker  Smokeless Tobacco Never Used    Labs: Recent Review Flowsheet Data    Labs for ITP Cardiac and Pulmonary Rehab Latest Ref Rng & Units 10/21/2016 10/21/2016 06/09/2018   Cholestrol 100 - 199 mg/dL - - 409   LDLCALC 0 - 99 mg/dL - - 45   HDL >81 mg/dL - - 53  Trlycerides 0 - 149 mg/dL - - 66   PHART 2.952 - 7.450 - 7.427 -   PCO2ART 32.0 - 48.0 mmHg - 41.7 -   HCO3 20.0 - 28.0 mmol/L 29.3(H) 27.5 -   TCO2 0 - 100 mmol/L 31 29 -   O2SAT % 61.0 99.0 -      Capillary Blood Glucose: No results found for: GLUCAP   Exercise Target Goals: Exercise Program Goal: Individual exercise prescription set using results from initial 6 min walk test and THRR while considering  patient's activity barriers and safety.   Exercise Prescription Goal: Initial exercise prescription builds to 30-45 minutes a day of aerobic activity, 2-3 days per week.  Home exercise guidelines will be given to patient during program as part of exercise prescription that the participant will  acknowledge.  Activity Barriers & Risk Stratification: Activity Barriers & Cardiac Risk Stratification - 11/17/18 0953    Activity Barriers & Cardiac Risk Stratification          Activity Barriers  Other (comment);Left Hip Replacement;Joint Problems    Comments  Left shoulder pain-screws in shoulder. Right shoulder replacement.    Cardiac Risk Stratification  High           6 Minute Walk: 6 Minute Walk    6 Minute Walk    Row Name 07/21/18 1410 11/17/18 0914   Phase  Initial  Initial   Distance  2025 feet  1458 feet   Distance Feet Change  no documentation  1458 ft   Walk Time  6 minutes  6 minutes   # of Rest Breaks  0  0   MPH  3.84  2.76   METS  4.11  3.46   RPE  12  11   Perceived Dyspnea   0  0   VO2 Peak  14.4  12.13   Symptoms  No  Yes (comment)   Comments  no documentation  Patient c/o left shoulder pain.   Resting HR  68 bpm  75 bpm   Resting BP  128/70  124/80   Resting Oxygen Saturation   97 %  100 %   Exercise Oxygen Saturation  during 6 min walk  96 %  96 %   Max Ex. HR  104 bpm  107 bpm   Max Ex. BP  132/72  124/70   2 Minute Post BP  120/70  111/60          Oxygen Initial Assessment:   Oxygen Re-Evaluation:   Oxygen Discharge (Final Oxygen Re-Evaluation):   Initial Exercise Prescription: Initial Exercise Prescription - 11/17/18 1000    Date of Initial Exercise RX and Referring Provider          Date  11/17/18    Referring Provider  Marca Ancona, MD    Expected Discharge Date  02/22/19        Recumbant Bike          Level  3    Minutes  10    METs  2.5        NuStep          Level  3    SPM  85    Minutes  10    METs  2.5        Track          Laps  12    Minutes  10    METs  3.09        Prescription Details  Frequency (times per week)  3    Duration  Progress to 30 minutes of continuous aerobic without signs/symptoms of physical distress        Intensity          THRR 40-80% of Max Heartrate  65-131     Ratings of Perceived Exertion  11-13    Perceived Dyspnea  0-4        Progression          Progression  Continue to progress workloads to maintain intensity without signs/symptoms of physical distress.        Resistance Training          Training Prescription  Yes    Weight  3lbs    Reps  10-15           Perform Capillary Blood Glucose checks as needed.  Exercise Prescription Changes:   Exercise Comments: Exercise Comments    Row Name 08/05/18 1707   Exercise Comments  Pt has not started exercising yet since orientation on 07/21/2018 due to illness and lack of sleep. Pt is planning to start rehab on Friday.       Exercise Goals and Review: Exercise Goals    Exercise Goals    Row Name 07/21/18 1424 11/17/18 1011   Increase Physical Activity  Yes  Yes   Intervention  Provide advice, education, support and counseling about physical activity/exercise needs.;Develop an individualized exercise prescription for aerobic and resistive training based on initial evaluation findings, risk stratification, comorbidities and participant's personal goals.  Provide advice, education, support and counseling about physical activity/exercise needs.;Develop an individualized exercise prescription for aerobic and resistive training based on initial evaluation findings, risk stratification, comorbidities and participant's personal goals.   Expected Outcomes  Short Term: Attend rehab on a regular basis to increase amount of physical activity.;Long Term: Exercising regularly at least 3-5 days a week.;Long Term: Add in home exercise to make exercise part of routine and to increase amount of physical activity.  Short Term: Attend rehab on a regular basis to increase amount of physical activity.;Long Term: Exercising regularly at least 3-5 days a week.;Long Term: Add in home exercise to make exercise part of routine and to increase amount of physical activity.   Increase Strength and Stamina  Yes  Yes    Intervention  Provide advice, education, support and counseling about physical activity/exercise needs.;Develop an individualized exercise prescription for aerobic and resistive training based on initial evaluation findings, risk stratification, comorbidities and participant's personal goals.  Provide advice, education, support and counseling about physical activity/exercise needs.;Develop an individualized exercise prescription for aerobic and resistive training based on initial evaluation findings, risk stratification, comorbidities and participant's personal goals.   Expected Outcomes  Short Term: Increase workloads from initial exercise prescription for resistance, speed, and METs.;Short Term: Perform resistance training exercises routinely during rehab and add in resistance training at home;Long Term: Improve cardiorespiratory fitness, muscular endurance and strength as measured by increased METs and functional capacity ( )  Short Term: Increase workloads from initial exercise prescription for resistance, speed, and METs.;Short Term: Perform resistance training exercises routinely during rehab and add in resistance training at home;Long Term: Improve cardiorespiratory fitness, muscular endurance and strength as measured by increased METs and functional capacity ( )   Able to understand and use rate of perceived exertion (RPE) scale  Yes  Yes   Intervention  Provide education and explanation on how to use RPE scale  Provide education and explanation on how to use RPE scale  Expected Outcomes  Short Term: Able to use RPE daily in rehab to express subjective intensity level;Long Term:  Able to use RPE to guide intensity level when exercising independently  Short Term: Able to use RPE daily in rehab to express subjective intensity level;Long Term:  Able to use RPE to guide intensity level when exercising independently   Knowledge and understanding of Target Heart Rate Range (THRR)  Yes  Yes    Intervention  Provide education and explanation of THRR including how the numbers were predicted and where they are located for reference  Provide education and explanation of THRR including how the numbers were predicted and where they are located for reference   Expected Outcomes  Short Term: Able to state/look up THRR;Long Term: Able to use THRR to govern intensity when exercising independently;Short Term: Able to use daily as guideline for intensity in rehab  Short Term: Able to state/look up THRR;Long Term: Able to use THRR to govern intensity when exercising independently;Short Term: Able to use daily as guideline for intensity in rehab   Able to check pulse independently  Yes  Yes   Intervention  Provide education and demonstration on how to check pulse in carotid and radial arteries.;Review the importance of being able to check your own pulse for safety during independent exercise  Provide education and demonstration on how to check pulse in carotid and radial arteries.;Review the importance of being able to check your own pulse for safety during independent exercise   Expected Outcomes  Short Term: Able to explain why pulse checking is important during independent exercise;Long Term: Able to check pulse independently and accurately  Short Term: Able to explain why pulse checking is important during independent exercise;Long Term: Able to check pulse independently and accurately   Understanding of Exercise Prescription  Yes  Yes   Intervention  Provide education, explanation, and written materials on patient's individual exercise prescription  Provide education, explanation, and written materials on patient's individual exercise prescription   Expected Outcomes  Short Term: Able to explain program exercise prescription;Long Term: Able to explain home exercise prescription to exercise independently  Short Term: Able to explain program exercise prescription;Long Term: Able to explain home exercise  prescription to exercise independently          Exercise Goals Re-Evaluation :   Discharge Exercise Prescription (Final Exercise Prescription Changes):   Nutrition:  Target Goals: Understanding of nutrition guidelines, daily intake of sodium 1500mg , cholesterol 200mg , calories 30% from fat and 7% or less from saturated fats, daily to have 5 or more servings of fruits and vegetables.  Biometrics: Pre Biometrics - 11/17/18 0904    Pre Biometrics          Height  5\' 10"  (1.778 m)    Weight  111.1 kg    Waist Circumference  48.5 inches    Hip Circumference  46.5 inches    Waist to Hip Ratio  1.04 %    BMI (Calculated)  35.14    Triceps Skinfold  32 mm    % Body Fat  36.9 %    Grip Strength  32.5 kg    Flexibility  0 in    Single Leg Stand  2.68 seconds            Nutrition Therapy Plan and Nutrition Goals: Nutrition Therapy & Goals - 07/21/18 1516    Nutrition Therapy          Diet  heart healthy  Personal Nutrition Goals          Nutrition Goal  Pt to identify and limit food sources of saturated fat, trans fat, refined carbohydrates and sodium    Personal Goal #2  Pt to identify food quantities necessary to achieve weight loss of 6-24 lbs. at graduation from cardiac rehab.    Personal Goal #3  Pt to eat a variety of non-starchy vegetables.    Personal Goal #4  Pt to make good choices when eating out at restaurants        Intervention Plan          Intervention  Prescribe, educate and counsel regarding individualized specific dietary modifications aiming towards targeted core components such as weight, hypertension, lipid management, diabetes, heart failure and other comorbidities.    Expected Outcomes  Short Term Goal: Understand basic principles of dietary content, such as calories, fat, sodium, cholesterol and nutrients.;Long Term Goal: Adherence to prescribed nutrition plan.           Nutrition Assessments: Nutrition Assessments - 07/21/18 1517     MEDFICTS Scores          Pre Score  44           Nutrition Goals Re-Evaluation: Nutrition Goals Re-Evaluation    Goals    Row Name 07/21/18 1516   Current Weight  231 lb 14.8 oz (105.2 kg)          Nutrition Goals Re-Evaluation: Nutrition Goals Re-Evaluation    Goals    Row Name 07/21/18 1516   Current Weight  231 lb 14.8 oz (105.2 kg)          Nutrition Goals Discharge (Final Nutrition Goals Re-Evaluation): Nutrition Goals Re-Evaluation - 07/21/18 1516    Goals          Current Weight  231 lb 14.8 oz (105.2 kg)           Psychosocial: Target Goals: Acknowledge presence or absence of significant depression and/or stress, maximize coping skills, provide positive support system. Participant is able to verbalize types and ability to use techniques and skills needed for reducing stress and depression.  Initial Review & Psychosocial Screening: Initial Psych Review & Screening - 11/17/18 1142    Initial Review          Current issues with  History of Depression;Current Stress Concerns    Source of Stress Concerns  Chronic Illness;Occupation;Financial;Poor Coping Skills        Family Dynamics          Good Support System?  Yes    Comments  upon brief assessment, no psychosocial needs identified, no interventions necessary         Barriers          Psychosocial barriers to participate in program  The patient should benefit from training in stress management and relaxation.        Screening Interventions          Interventions  Encouraged to exercise;To provide support and resources with identified psychosocial needs;Provide feedback about the scores to participant    Expected Outcomes  Short Term goal: Identification and review with participant of any Quality of Life or Depression concerns found by scoring the questionnaire.;Long Term goal: The participant improves quality of Life and PHQ9 Scores as seen by post scores and/or verbalization of changes            Quality of Life Scores: Quality of Life - 11/17/18 1035    Quality of  Life          Select  Quality of Life        Quality of Life Scores          Health/Function Pre  16.9 %    Socioeconomic Pre  23.86 %    Psych/Spiritual Pre  19.29 %    Family Pre  30 %    GLOBAL Pre  20.75 %          Scores of 19 and below usually indicate a poorer quality of life in these areas.  A difference of  2-3 points is a clinically meaningful difference.  A difference of 2-3 points in the total score of the Quality of Life Index has been associated with significant improvement in overall quality of life, self-image, physical symptoms, and general health in studies assessing change in quality of life.  PHQ-9: Recent Review Flowsheet Data    Depression screen [facility] 2/9 02/04/2018   Decreased Interest 0   Down, Depressed, Hopeless 0   PHQ - 2 Score 0     Interpretation of Total Score  Total Score Depression Severity:  1-4 = Minimal depression, 5-9 = Mild depression, 10-14 = Moderate depression, 15-19 = Moderately severe depression, 20-27 = Severe depression   Psychosocial Evaluation and Intervention:   Psychosocial Re-Evaluation:   Psychosocial Discharge (Final Psychosocial Re-Evaluation):   Vocational Rehabilitation: Provide vocational rehab assistance to qualifying candidates.   Vocational Rehab Evaluation & Intervention: Vocational Rehab - 11/17/18 1207    Initial Vocational Rehab Evaluation & Intervention          Assessment shows need for Vocational Rehabilitation  Yes   frequent travel with current position          Education: Education Goals: Education classes will be provided on a weekly basis, covering required topics. Participant will state understanding/return demonstration of topics presented.  Learning Barriers/Preferences: Learning Barriers/Preferences - 11/17/18 1039    Learning Barriers/Preferences          Learning Barriers  Sight    Learning Preferences   Skilled Demonstration;Verbal Instruction;Video;Audio           Education Topics: Count Your Pulse:  -Group instruction provided by verbal instruction, demonstration, patient participation and written materials to support subject.  Instructors address importance of being able to find your pulse and how to count your pulse when at home without a heart monitor.  Patients get hands on experience counting their pulse with staff help and individually.   Heart Attack, Angina, and Risk Factor Modification:  -Group instruction provided by verbal instruction, video, and written materials to support subject.  Instructors address signs and symptoms of angina and heart attacks.    Also discuss risk factors for heart disease and how to make changes to improve heart health risk factors.   Functional Fitness:  -Group instruction provided by verbal instruction, demonstration, patient participation, and written materials to support subject.  Instructors address safety measures for doing things around the house.  Discuss how to get up and down off the floor, how to pick things up properly, how to safely get out of a chair without assistance, and balance training.   Meditation and Mindfulness:  -Group instruction provided by verbal instruction, patient participation, and written materials to support subject.  Instructor addresses importance of mindfulness and meditation practice to help reduce stress and improve awareness.  Instructor also leads participants through a meditation exercise.    Stretching for Flexibility and Mobility:  -Group instruction provided by  verbal instruction, patient participation, and written materials to support subject.  Instructors lead participants through series of stretches that are designed to increase flexibility thus improving mobility.  These stretches are additional exercise for major muscle groups that are typically performed during regular warm up and cool down.   Hands  Only CPR:  -Group verbal, video, and participation provides a basic overview of AHA guidelines for community CPR. Role-play of emergencies allow participants the opportunity to practice calling for help and chest compression technique with discussion of AED use.   Hypertension: -Group verbal and written instruction that provides a basic overview of hypertension including the most recent diagnostic guidelines, risk factor reduction with self-care instructions and medication management.    Nutrition I class: Heart Healthy Eating:  -Group instruction provided by PowerPoint slides, verbal discussion, and written materials to support subject matter. The instructor gives an explanation and review of the Therapeutic Lifestyle Changes diet recommendations, which includes a discussion on lipid goals, dietary fat, sodium, fiber, plant stanol/sterol esters, sugar, and the components of a well-balanced, healthy diet.   Nutrition II class: Lifestyle Skills:  -Group instruction provided by PowerPoint slides, verbal discussion, and written materials to support subject matter. The instructor gives an explanation and review of label reading, grocery shopping for heart health, heart healthy recipe modifications, and ways to make healthier choices when eating out.   Diabetes Question & Answer:  -Group instruction provided by PowerPoint slides, verbal discussion, and written materials to support subject matter. The instructor gives an explanation and review of diabetes co-morbidities, pre- and post-prandial blood glucose goals, pre-exercise blood glucose goals, signs, symptoms, and treatment of hypoglycemia and hyperglycemia, and foot care basics.   Diabetes Blitz:  -Group instruction provided by PowerPoint slides, verbal discussion, and written materials to support subject matter. The instructor gives an explanation and review of the physiology behind type 1 and type 2 diabetes, diabetes medications and rational  behind using different medications, pre- and post-prandial blood glucose recommendations and Hemoglobin A1c goals, diabetes diet, and exercise including blood glucose guidelines for exercising safely.    Portion Distortion:  -Group instruction provided by PowerPoint slides, verbal discussion, written materials, and food models to support subject matter. The instructor gives an explanation of serving size versus portion size, changes in portions sizes over the last 20 years, and what consists of a serving from each food group.   Stress Management:  -Group instruction provided by verbal instruction, video, and written materials to support subject matter.  Instructors review role of stress in heart disease and how to cope with stress positively.     Exercising on Your Own:  -Group instruction provided by verbal instruction, power point, and written materials to support subject.  Instructors discuss benefits of exercise, components of exercise, frequency and intensity of exercise, and end points for exercise.  Also discuss use of nitroglycerin and activating EMS.  Review options of places to exercise outside of rehab.  Review guidelines for sex with heart disease.   Cardiac Drugs I:  -Group instruction provided by verbal instruction and written materials to support subject.  Instructor reviews cardiac drug classes: antiplatelets, anticoagulants, beta blockers, and statins.  Instructor discusses reasons, side effects, and lifestyle considerations for each drug class.   Cardiac Drugs II:  -Group instruction provided by verbal instruction and written materials to support subject.  Instructor reviews cardiac drug classes: angiotensin converting enzyme inhibitors (ACE-I), angiotensin II receptor blockers (ARBs), nitrates, and calcium channel blockers.  Instructor discusses reasons, side effects,  and lifestyle considerations for each drug class.   Anatomy and Physiology of the Circulatory System:   Group verbal and written instruction and models provide basic cardiac anatomy and physiology, with the coronary electrical and arterial systems. Review of: AMI, Angina, Valve disease, Heart Failure, Peripheral Artery Disease, Cardiac Arrhythmia, Pacemakers, and the ICD.   Other Education:  -Group or individual verbal, written, or video instructions that support the educational goals of the cardiac rehab program.   Holiday Eating Survival Tips:  -Group instruction provided by PowerPoint slides, verbal discussion, and written materials to support subject matter. The instructor gives patients tips, tricks, and techniques to help them not only survive but enjoy the holidays despite the onslaught of food that accompanies the holidays.   Knowledge Questionnaire Score: Knowledge Questionnaire Score - 11/17/18 1020    Knowledge Questionnaire Score          Pre Score  21/24           Core Components/Risk Factors/Patient Goals at Admission: Personal Goals and Risk Factors at Admission - 11/17/18 1035    Core Components/Risk Factors/Patient Goals on Admission           Weight Management  Yes;Obesity;Weight Loss    Intervention  Weight Management: Provide education and appropriate resources to help participant work on and attain dietary goals.;Weight Management: Develop a combined nutrition and exercise program designed to reach desired caloric intake, while maintaining appropriate intake of nutrient and fiber, sodium and fats, and appropriate energy expenditure required for the weight goal.;Weight Management/Obesity: Establish reasonable short term and long term weight goals.;Obesity: Provide education and appropriate resources to help participant work on and attain dietary goals.    Admit Weight  244 lb 14.9 oz (111.1 kg)    Goal Weight: Short Term  238 lb (108 kg)    Goal Weight: Long Term  200 lb (90.7 kg)    Expected Outcomes  Short Term: Continue to assess and modify interventions until  short term weight is achieved;Long Term: Adherence to nutrition and physical activity/exercise program aimed toward attainment of established weight goal;Weight Loss: Understanding of general recommendations for a balanced deficit meal plan, which promotes 1-2 lb weight loss per week and includes a negative energy balance of [phone] kcal/d;Understanding recommendations for meals to include 15-35% energy as protein, 25-35% energy from fat, 35-60% energy from carbohydrates, less than  of dietary cholesterol, 20-35 gm of total fiber daily;Understanding of distribution of calorie intake throughout the day with the consumption of 4-5 meals/snacks    Heart Failure  Yes    Intervention  Provide a combined exercise and nutrition program that is supplemented with education, support and counseling about heart failure. Directed toward relieving symptoms such as shortness of breath, decreased exercise tolerance, and extremity edema.    Expected Outcomes  Improve functional capacity of life;Short term: Attendance in program 2-3 days a week with increased exercise capacity. Reported lower sodium intake. Reported increased fruit and vegetable intake. Reports medication compliance.;Long term: Adoption of self-care skills and reduction of barriers for early signs and symptoms recognition and intervention leading to self-care maintenance.;Short term: Daily weights obtained and reported for increase. Utilizing diuretic protocols set by physician.    Hypertension  Yes    Intervention  Provide education on lifestyle modifcations including regular physical activity/exercise, weight management, moderate sodium restriction and increased consumption of fresh fruit, vegetables, and low fat dairy, alcohol moderation, and smoking cessation.;Monitor prescription use compliance.    Expected Outcomes  Short Term: Continued assessment and  intervention until BP is < 140/86mm HG in hypertensive participants. < 130/37mm HG in hypertensive  participants with diabetes, heart failure or chronic kidney disease.;Long Term: Maintenance of blood pressure at goal levels.    Lipids  Yes    Intervention  Provide education and support for participant on nutrition & aerobic/resistive exercise along with prescribed medications to achieve LDL 70mg , HDL >40mg .    Expected Outcomes  Short Term: Participant states understanding of desired cholesterol values and is compliant with medications prescribed. Participant is following exercise prescription and nutrition guidelines.;Long Term: Cholesterol controlled with medications as prescribed, with individualized exercise RX and with personalized nutrition plan. Value goals: LDL < , HDL > 40 mg.           Core Components/Risk Factors/Patient Goals Review:    Core Components/Risk Factors/Patient Goals at Discharge (Final Review):    ITP Comments: ITP Comments    Row Name 07/21/18 1335 08/06/18 0738 09/02/18 1517 11/17/18 0904   ITP Comments  Dr. Armanda Magic, Medical Director   30 Day ITP Review.  Pt has yet to start CR d/t illness and work conflicts.  Scheduled to start 08/07/18.  Pt dropped from the program d/t nonattendance. Pt only came to orientation.   Medical Director- Dr. Armanda Magic, MD      Comments: Patient attended orientation from [id number]  to review rules and guidelines for program. Completed 6 minute walk test, Intitial ITP, and exercise prescription.  VSS. Telemetry-sinus rhythm, NSSTT change,   Asymptomatic.  Pt c/o left shoulder pain which is chronic for him. Pt has orthopaedic appointment this afternoon. Pt is anticipating surgery will be scheduled.  Awaiting Dr. Dinah Beers recommendation.    Deveron Furlong, RN, BSN Cardiac Pulmonary Rehab 11/17/18 12:16 PM

## 2018-11-18 ENCOUNTER — Ambulatory Visit (HOSPITAL_COMMUNITY): Payer: Self-pay

## 2018-11-20 ENCOUNTER — Ambulatory Visit (HOSPITAL_COMMUNITY): Payer: Self-pay

## 2018-11-20 DIAGNOSIS — Z969 Presence of functional implant, unspecified: Secondary | ICD-10-CM | POA: Insufficient documentation

## 2018-11-20 DIAGNOSIS — M87022 Idiopathic aseptic necrosis of left humerus: Secondary | ICD-10-CM | POA: Insufficient documentation

## 2018-11-23 ENCOUNTER — Telehealth (HOSPITAL_COMMUNITY): Payer: Self-pay | Admitting: Internal Medicine

## 2018-11-23 ENCOUNTER — Ambulatory Visit (HOSPITAL_COMMUNITY): Payer: Self-pay

## 2018-11-25 ENCOUNTER — Ambulatory Visit (HOSPITAL_COMMUNITY): Payer: Self-pay

## 2018-11-27 ENCOUNTER — Telehealth (HOSPITAL_COMMUNITY): Payer: Self-pay | Admitting: Internal Medicine

## 2018-11-27 ENCOUNTER — Telehealth: Payer: Self-pay | Admitting: *Deleted

## 2018-11-27 ENCOUNTER — Ambulatory Visit (HOSPITAL_COMMUNITY): Payer: Self-pay

## 2018-11-27 NOTE — Telephone Encounter (Signed)
   [facility] Pre-operative Risk Assessment    Request for surgical clearance:  1. What type of surgery is being performed? REMOVAL DEEP HARDWARE BIOMET PLATE LEFT SHOULDER PROXIMAL HUMERUS    2. When is this surgery scheduled? 12/17/18   3. What type of clearance is required (medical clearance vs. Pharmacy clearance to hold med vs. Both)? MEDICAL  4. Are there any medications that need to be held prior to surgery and how long?NONE LISTED ; SURGEON IS ASKING FOR OFFICE NOTES, LABS, CXR, EKG TO BE FAXED AS WELL  5. Practice name and name of physician performing surgery? [facility]; DR. Harrell Gave [facility]   6. What is your office phone number [phone]    7.   What is your office fax number [phone]  8.   Anesthesia type (None, local, MAC, general) ? LEFT MESSAGE TO VERIFY ANESTHESIA    Julaine Hua 11/27/2018, 4:42 PM  _________________________________________________________________   (provider comments below)

## 2018-11-30 ENCOUNTER — Ambulatory Visit (HOSPITAL_COMMUNITY): Payer: Self-pay

## 2018-11-30 ENCOUNTER — Telehealth (HOSPITAL_COMMUNITY): Payer: Self-pay | Admitting: *Deleted

## 2018-11-30 ENCOUNTER — Encounter: Payer: Self-pay | Admitting: Cardiovascular Disease

## 2018-11-30 NOTE — Telephone Encounter (Signed)
I have called the patient and spoke with him, he is able to complete 4 METS of activity. Since he just got a ICD and the surgery is near the left shoulder, will check with Dr. Royann Shivers to see if a magnet over the ICD would suffice.   Patient's wife is under the impression that the cardiologist will be present during the surgery. I think this may be a misunderstanding and his surgeon improved them he will "coordinate" with cardiologist. Will verify with surgeon's office. Otherwise, since he is not in heart failure and able to complete at least 4 METS of activity, I think he can proceed with surgery. I advised him to keep appointment with Dr. Shirlee Latch. (although this may change depend on clinic policy regarding nonurgent office followup during Corona virus outbreak)

## 2018-11-30 NOTE — Telephone Encounter (Signed)
Left message for the patient to call back. H/o NICM with severe LV dysfunction, last echo in 08/2018 showed EF improved to 25-30%. If he does not call back, he can potentially be cleared by Dr. Shirlee Latch during next office visit on 12/11/2018.

## 2018-11-30 NOTE — Telephone Encounter (Signed)
Letter sent via Epic. I see a date of 04/02, but I am not sure of the location or the time. Please send the information to the [facility]. [employer] rep.

## 2018-11-30 NOTE — Telephone Encounter (Signed)
Since the device is in the surgical field, cannot just tape a magnet. Will need to contact the ICD rep to reprogram the tachy detection and therapies "OFF" for the procedure and then turn them back on afterwards. I will place a letter via epic MCr

## 2018-11-30 NOTE — Telephone Encounter (Signed)
DR. Cristal Deer [facility]'s returned call, patient is having general anesthesia.

## 2018-12-02 ENCOUNTER — Ambulatory Visit (HOSPITAL_COMMUNITY): Payer: Self-pay

## 2018-12-02 ENCOUNTER — Telehealth (HOSPITAL_COMMUNITY): Payer: Self-pay

## 2018-12-02 NOTE — Telephone Encounter (Signed)
Left vm regarding resched of 3/27 appt

## 2018-12-04 ENCOUNTER — Ambulatory Visit (HOSPITAL_COMMUNITY): Payer: Self-pay

## 2018-12-04 ENCOUNTER — Encounter (HOSPITAL_COMMUNITY): Payer: Self-pay | Admitting: *Deleted

## 2018-12-04 DIAGNOSIS — I5022 Chronic systolic (congestive) heart failure: Secondary | ICD-10-CM

## 2018-12-07 ENCOUNTER — Ambulatory Visit (HOSPITAL_COMMUNITY): Payer: Self-pay

## 2018-12-08 ENCOUNTER — Encounter (HOSPITAL_COMMUNITY): Payer: Self-pay | Admitting: *Deleted

## 2018-12-08 DIAGNOSIS — I5022 Chronic systolic (congestive) heart failure: Secondary | ICD-10-CM

## 2018-12-08 NOTE — Progress Notes (Signed)
[id number] Called to notify patient that the cardiac and pulmonary rehabilitation department will be closed for 4 weeks due to COVID-19 restrictions. Pt verbalized understanding.   Reviewed home exercise guidelines with patient including endpoints, temperature precautions, target heart rate and rate of perceived exertion. Pt plans to walk as his mode of home exercise. Pt voices understanding of instructions given. Will mail home exercise packet to patient at address provided. Artist Pais, MS, ACSM CEP

## 2018-12-08 NOTE — Progress Notes (Signed)
Cardiac Individual Treatment Plan  Patient Details  Name: John [name] MRN: [id number] Date of Birth: Jul 09, 1961 Referring Provider:     CARDIAC REHAB PHASE II ORIENTATION from 11/17/2018 in [facility] CARDIAC REHAB  Referring Provider  Marca Ancona, MD      Initial Encounter Date:    CARDIAC REHAB PHASE II ORIENTATION from 11/17/2018 in [facility] CARDIAC REHAB  Date  11/17/18      Visit Diagnosis: 2020 Systolic heart failure  Patient's Home Medications on Admission:  Current Outpatient Medications:  .  acetaminophen (TYLENOL) 325 MG tablet, Take 650 mg by mouth every 6 (six) hours as needed for mild pain., Disp: , Rfl:  .  carvedilol (COREG) 25 MG tablet, TAKE 1 TABLET (25 MG TOTAL) BY MOUTH 2 (TWO) TIMES DAILY WITH A MEAL., Disp: 60 tablet, Rfl: 6 .  cetirizine (ZYRTEC) 10 MG chewable tablet, Chew 10 mg by mouth daily., Disp: , Rfl:  .  furosemide (LASIX) 40 MG tablet, TAKE 1 TABLET BY MOUTH TWICE A DAY, Disp: 60 tablet, Rfl: 5 .  furosemide (LASIX) 80 MG tablet, Take 1 tablet (80 mg total) by mouth every morning, then take 1/2 tablet (  total) by mouth every evening. (Patient not taking: Reported on 10/28/2018), Disp: 45 tablet, Rfl: 3 .  KLOR-CON M20 20 MEQ tablet, TAKE 1 TABLET (20 MEQ TOTAL) BY MOUTH DAILY., Disp: 30 tablet, Rfl: 5 .  LORazepam (ATIVAN) 1 MG tablet, Take 0.5-1 mg by mouth every 8 (eight) hours as needed for anxiety., Disp: , Rfl:  .  Multiple Vitamins-Minerals (MULTIVITAMIN WITH MINERALS) tablet, Take 1 tablet by mouth daily., Disp: , Rfl:  .  rosuvastatin (CRESTOR) 20 MG tablet, Take 20 mg by mouth daily., Disp: , Rfl:  .  sacubitril-valsartan (ENTRESTO) 97-103 MG, Take 1 tablet by mouth 2 (two) times daily., Disp: 60 tablet, Rfl: 6 .  sertraline (ZOLOFT) 50 MG tablet, TAKE 1 TABLET BY MOUTH EVERY DAY, Disp: 90 tablet, Rfl: 3 .  sildenafil (VIAGRA) 100 MG tablet, Take 0.5 tablets (50 mg total) by mouth at bedtime as  needed for erectile dysfunction., Disp: 10 tablet, Rfl: 6 .  spironolactone (ALDACTONE) 25 MG tablet, Take 1 tablet (25 mg total) by mouth daily., Disp: 30 tablet, Rfl: 3  Past Medical History: Past Medical History:  Diagnosis Date  . Anxiety    Ativan  . Chronic combined systolic and diastolic CHF, NYHA class 3 (HCC) 10/2016   Nonischemic cardiomyopathy. EF 20-25%.  . Chronic left hip pain   . Depression    history of  . GI bleed 03/2016  . Headache   . Hypertensive heart disease with combined systolic and diastolic congestive heart failure (HCC) 10/2016  . Nonischemic cardiomyopathy (HCC) 10/2016   Echo with EF 20-25%. Cardiac catheterization with no CAD. LVEDP was 41 mmHg, PCWP 36 mmHg  . Osteoarthritis    right shoulder  . Right hand fracture   . Seasonal allergies   . Sleep apnea    wears a CPAP  . Wears glasses     Tobacco Use: Social History   Tobacco Use  Smoking Status Never Smoker  Smokeless Tobacco Never Used    Labs: Recent Review Flowsheet Data    Labs for ITP Cardiac and Pulmonary Rehab Latest Ref Rng & Units 10/21/2016 10/21/2016 06/09/2018   Cholestrol 100 - 199 mg/dL - - 409   LDLCALC 0 - 99 mg/dL - - 45   HDL >81 mg/dL - - 53  Trlycerides 0 - 149 mg/dL - - 66   PHART 3.845 - 7.450 - 7.427 -   PCO2ART 32.0 - 48.0 mmHg - 41.7 -   HCO3 20.0 - 28.0 mmol/L 29.3(H) 27.5 -   TCO2 0 - 100 mmol/L 31 29 -   O2SAT % 61.0 99.0 -      Capillary Blood Glucose: No results found for: GLUCAP   Exercise Target Goals: Exercise Program Goal: Individual exercise prescription set using results from initial 6 min walk test and THRR while considering  patient's activity barriers and safety.   Exercise Prescription Goal: Initial exercise prescription builds to 30-45 minutes a day of aerobic activity, 2-3 days per week.  Home exercise guidelines will be given to patient during program as part of exercise prescription that the participant will acknowledge.  Activity  Barriers & Risk Stratification: Activity Barriers & Cardiac Risk Stratification - 11/17/18 0953      Activity Barriers & Cardiac Risk Stratification   Activity Barriers  Other (comment);Left Hip Replacement;Joint Problems    Comments  Left shoulder pain-screws in shoulder. Right shoulder replacement.    Cardiac Risk Stratification  High       6 Minute Walk: 6 Minute Walk    Row Name 11/17/18 0914         6 Minute Walk   Phase  Initial     Distance  1458 feet     Distance Feet Change  1458 ft     Walk Time  6 minutes     # of Rest Breaks  0     MPH  2.76     METS  3.46     RPE  11     Perceived Dyspnea   0     VO2 Peak  12.13     Symptoms  Yes (comment)     Comments  Patient c/o left shoulder pain.     Resting HR  75 bpm     Resting BP  124/80     Resting Oxygen Saturation   100 %     Exercise Oxygen Saturation  during 6 min walk  96 %     Max Ex. HR  107 bpm     Max Ex. BP  124/70     2 Minute Post BP  111/60        Oxygen Initial Assessment:   Oxygen Re-Evaluation:   Oxygen Discharge (Final Oxygen Re-Evaluation):   Initial Exercise Prescription: Initial Exercise Prescription - 11/17/18 1000      Date of Initial Exercise RX and Referring Provider   Date  11/17/18    Referring Provider  Marca Ancona, MD    Expected Discharge Date  02/22/19      Recumbant Bike   Level  3    Minutes  10    METs  2.5      NuStep   Level  3    SPM  85    Minutes  10    METs  2.5      Track   Laps  12    Minutes  10    METs  3.09      Prescription Details   Frequency (times per week)  3    Duration  Progress to 30 minutes of continuous aerobic without signs/symptoms of physical distress      Intensity   THRR 40-80% of Max Heartrate  65-131    Ratings of Perceived Exertion  11-13  Perceived Dyspnea  0-4      Progression   Progression  Continue to progress workloads to maintain intensity without signs/symptoms of physical distress.      Resistance  Training   Training Prescription  Yes    Weight  3lbs    Reps  10-15       Perform Capillary Blood Glucose checks as needed.  Exercise Prescription Changes:   Exercise Comments: Exercise Comments    Row Name 12/08/18 1351           Exercise Comments  Reviewed home exercise guidelines with patient via telephone. Will mail home exercise packet to patient at address given.          Exercise Goals and Review: Exercise Goals    Row Name 11/17/18 1011             Exercise Goals   Increase Physical Activity  Yes       Intervention  Provide advice, education, support and counseling about physical activity/exercise needs.;Develop an individualized exercise prescription for aerobic and resistive training based on initial evaluation findings, risk stratification, comorbidities and participant's personal goals.       Expected Outcomes  Short Term: Attend rehab on a regular basis to increase amount of physical activity.;Long Term: Exercising regularly at least 3-5 days a week.;Long Term: Add in home exercise to make exercise part of routine and to increase amount of physical activity.       Increase Strength and Stamina  Yes       Intervention  Provide advice, education, support and counseling about physical activity/exercise needs.;Develop an individualized exercise prescription for aerobic and resistive training based on initial evaluation findings, risk stratification, comorbidities and participant's personal goals.       Expected Outcomes  Short Term: Increase workloads from initial exercise prescription for resistance, speed, and METs.;Short Term: Perform resistance training exercises routinely during rehab and add in resistance training at home;Long Term: Improve cardiorespiratory fitness, muscular endurance and strength as measured by increased METs and functional capacity (6MWT)       Able to understand and use rate of perceived exertion (RPE) scale  Yes       Intervention  Provide  education and explanation on how to use RPE scale       Expected Outcomes  Short Term: Able to use RPE daily in rehab to express subjective intensity level;Long Term:  Able to use RPE to guide intensity level when exercising independently       Knowledge and understanding of Target Heart Rate Range (THRR)  Yes       Intervention  Provide education and explanation of THRR including how the numbers were predicted and where they are located for reference       Expected Outcomes  Short Term: Able to state/look up THRR;Long Term: Able to use THRR to govern intensity when exercising independently;Short Term: Able to use daily as guideline for intensity in rehab       Able to check pulse independently  Yes       Intervention  Provide education and demonstration on how to check pulse in carotid and radial arteries.;Review the importance of being able to check your own pulse for safety during independent exercise       Expected Outcomes  Short Term: Able to explain why pulse checking is important during independent exercise;Long Term: Able to check pulse independently and accurately       Understanding of Exercise Prescription  Yes  Intervention  Provide education, explanation, and written materials on patient's individual exercise prescription       Expected Outcomes  Short Term: Able to explain program exercise prescription;Long Term: Able to explain home exercise prescription to exercise independently          Exercise Goals Re-Evaluation : Exercise Goals Re-Evaluation    Row Name 12/08/18 1351             Exercise Goal Re-Evaluation   Exercise Goals Review  Increase Physical Activity;Able to understand and use rate of perceived exertion (RPE) scale;Understanding of Exercise Prescription;Increase Strength and Stamina       Comments  Contacted patient via phone and reviewed home exercise guidelines including THRR, RPE scale, and endpoints for exercise. Pt is not currently exercising at home but  plans to walk as his mode of home exercise.       Expected Outcomes  Patient will walk 30 minutes, at least 4 days/week to help increase strength and stamina.          Discharge Exercise Prescription (Final Exercise Prescription Changes):   Nutrition:  Target Goals: Understanding of nutrition guidelines, daily intake of sodium 1500mg , cholesterol 200mg , calories 30% from fat and 7% or less from saturated fats, daily to have 5 or more servings of fruits and vegetables.  Biometrics: Pre Biometrics - 11/17/18 0904      Pre Biometrics   Height   (1.778 m)    Weight  244 lb 14.9 oz (111.1 kg)    Waist Circumference  48.5 inches    Hip Circumference  46.5 inches    Waist to Hip Ratio  1.04 %    BMI (Calculated)  35.14    Triceps Skinfold  32 mm    % Body Fat  36.9 %    Grip Strength  32.5 kg    Flexibility  0 in    Single Leg Stand  2.68 seconds        Nutrition Therapy Plan and Nutrition Goals:   Nutrition Assessments:   Nutrition Goals Re-Evaluation:   Nutrition Goals Re-Evaluation:   Nutrition Goals Discharge (Final Nutrition Goals Re-Evaluation):   Psychosocial: Target Goals: Acknowledge presence or absence of significant depression and/or stress, maximize coping skills, provide positive support system. Participant is able to verbalize types and ability to use techniques and skills needed for reducing stress and depression.  Initial Review & Psychosocial Screening: Initial Psych Review & Screening - 11/17/18 1142      Initial Review   Current issues with  History of Depression;Current Stress Concerns    Source of Stress Concerns  Chronic Illness;Occupation;Financial;Poor Coping Skills      Family Dynamics   Good Support System?  Yes    Comments  upon brief assessment, no psychosocial needs identified, no interventions necessary       Barriers   Psychosocial barriers to participate in program  The patient should benefit from training in stress  management and relaxation.      Screening Interventions   Interventions  Encouraged to exercise;To provide support and resources with identified psychosocial needs;Provide feedback about the scores to participant    Expected Outcomes  Short Term goal: Identification and review with participant of any Quality of Life or Depression concerns found by scoring the questionnaire.;Long Term goal: The participant improves quality of Life and PHQ9 Scores as seen by post scores and/or verbalization of changes       Quality of Life Scores: Quality of Life - 11/17/18 1035  Quality of Life   Select  Quality of Life      Quality of Life Scores   Health/Function Pre  16.9 %    Socioeconomic Pre  23.86 %    Psych/Spiritual Pre  19.29 %    Family Pre  30 %    GLOBAL Pre  20.75 %      Scores of 19 and below usually indicate a poorer quality of life in these areas.  A difference of  2-3 points is a clinically meaningful difference.  A difference of 2-3 points in the total score of the Quality of Life Index has been associated with significant improvement in overall quality of life, self-image, physical symptoms, and general health in studies assessing change in quality of life.  PHQ-9: Recent Review Flowsheet Data    Depression screen [facility] 2/9 02/04/2018   Decreased Interest 0   Down, Depressed, Hopeless 0   PHQ - 2 Score 0     Interpretation of Total Score  Total Score Depression Severity:  1-4 = Minimal depression, 5-9 = Mild depression, 10-14 = Moderate depression, 15-19 = Moderately severe depression, 20-27 = Severe depression   Psychosocial Evaluation and Intervention:   Psychosocial Re-Evaluation: Psychosocial Re-Evaluation    Row Name 12/04/18 1131             Psychosocial Re-Evaluation   Current issues with  History of Depression;Current Stress Concerns       Comments  Unable to assess as exercise is currently on hold          Psychosocial Discharge (Final Psychosocial  Re-Evaluation): Psychosocial Re-Evaluation - 12/04/18 1131      Psychosocial Re-Evaluation   Current issues with  History of Depression;Current Stress Concerns    Comments  Unable to assess as exercise is currently on hold       Vocational Rehabilitation: Provide vocational rehab assistance to qualifying candidates.   Vocational Rehab Evaluation & Intervention: Vocational Rehab - 11/17/18 1207      Initial Vocational Rehab Evaluation & Intervention   Assessment shows need for Vocational Rehabilitation  Yes   frequent travel with current position      Education: Education Goals: Education classes will be provided on a weekly basis, covering required topics. Participant will state understanding/return demonstration of topics presented.  Learning Barriers/Preferences: Learning Barriers/Preferences - 11/17/18 1039      Learning Barriers/Preferences   Learning Barriers  Sight    Learning Preferences  Skilled Demonstration;Verbal Instruction;Video;Audio       Education Topics: Count Your Pulse:  -Group instruction provided by verbal instruction, demonstration, patient participation and written materials to support subject.  Instructors address importance of being able to find your pulse and how to count your pulse when at home without a heart monitor.  Patients get hands on experience counting their pulse with staff help and individually.   Heart Attack, Angina, and Risk Factor Modification:  -Group instruction provided by verbal instruction, video, and written materials to support subject.  Instructors address signs and symptoms of angina and heart attacks.    Also discuss risk factors for heart disease and how to make changes to improve heart health risk factors.   Functional Fitness:  -Group instruction provided by verbal instruction, demonstration, patient participation, and written materials to support subject.  Instructors address safety measures for doing things around the  house.  Discuss how to get up and down off the floor, how to pick things up properly, how to safely get out of a  chair without assistance, and balance training.   Meditation and Mindfulness:  -Group instruction provided by verbal instruction, patient participation, and written materials to support subject.  Instructor addresses importance of mindfulness and meditation practice to help reduce stress and improve awareness.  Instructor also leads participants through a meditation exercise.    Stretching for Flexibility and Mobility:  -Group instruction provided by verbal instruction, patient participation, and written materials to support subject.  Instructors lead participants through series of stretches that are designed to increase flexibility thus improving mobility.  These stretches are additional exercise for major muscle groups that are typically performed during regular warm up and cool down.   Hands Only CPR:  -Group verbal, video, and participation provides a basic overview of AHA guidelines for community CPR. Role-play of emergencies allow participants the opportunity to practice calling for help and chest compression technique with discussion of AED use.   Hypertension: -Group verbal and written instruction that provides a basic overview of hypertension including the most recent diagnostic guidelines, risk factor reduction with self-care instructions and medication management.    Nutrition I class: Heart Healthy Eating:  -Group instruction provided by PowerPoint slides, verbal discussion, and written materials to support subject matter. The instructor gives an explanation and review of the Therapeutic Lifestyle Changes diet recommendations, which includes a discussion on lipid goals, dietary fat, sodium, fiber, plant stanol/sterol esters, sugar, and the components of a well-balanced, healthy diet.   Nutrition II class: Lifestyle Skills:  -Group instruction provided by PowerPoint  slides, verbal discussion, and written materials to support subject matter. The instructor gives an explanation and review of label reading, grocery shopping for heart health, heart healthy recipe modifications, and ways to make healthier choices when eating out.   Diabetes Question & Answer:  -Group instruction provided by PowerPoint slides, verbal discussion, and written materials to support subject matter. The instructor gives an explanation and review of diabetes co-morbidities, pre- and post-prandial blood glucose goals, pre-exercise blood glucose goals, signs, symptoms, and treatment of hypoglycemia and hyperglycemia, and foot care basics.   Diabetes Blitz:  -Group instruction provided by PowerPoint slides, verbal discussion, and written materials to support subject matter. The instructor gives an explanation and review of the physiology behind type 1 and type 2 diabetes, diabetes medications and rational behind using different medications, pre- and post-prandial blood glucose recommendations and Hemoglobin A1c goals, diabetes diet, and exercise including blood glucose guidelines for exercising safely.    Portion Distortion:  -Group instruction provided by PowerPoint slides, verbal discussion, written materials, and food models to support subject matter. The instructor gives an explanation of serving size versus portion size, changes in portions sizes over the last 20 years, and what consists of a serving from each food group.   Stress Management:  -Group instruction provided by verbal instruction, video, and written materials to support subject matter.  Instructors review role of stress in heart disease and how to cope with stress positively.     Exercising on Your Own:  -Group instruction provided by verbal instruction, power point, and written materials to support subject.  Instructors discuss benefits of exercise, components of exercise, frequency and intensity of exercise, and end  points for exercise.  Also discuss use of nitroglycerin and activating EMS.  Review options of places to exercise outside of rehab.  Review guidelines for sex with heart disease.   Cardiac Drugs I:  -Group instruction provided by verbal instruction and written materials to support subject.  Instructor reviews cardiac drug classes:  antiplatelets, anticoagulants, beta blockers, and statins.  Instructor discusses reasons, side effects, and lifestyle considerations for each drug class.   Cardiac Drugs II:  -Group instruction provided by verbal instruction and written materials to support subject.  Instructor reviews cardiac drug classes: angiotensin converting enzyme inhibitors (ACE-I), angiotensin II receptor blockers (ARBs), nitrates, and calcium channel blockers.  Instructor discusses reasons, side effects, and lifestyle considerations for each drug class.   Anatomy and Physiology of the Circulatory System:  Group verbal and written instruction and models provide basic cardiac anatomy and physiology, with the coronary electrical and arterial systems. Review of: AMI, Angina, Valve disease, Heart Failure, Peripheral Artery Disease, Cardiac Arrhythmia, Pacemakers, and the ICD.   Other Education:  -Group or individual verbal, written, or video instructions that support the educational goals of the cardiac rehab program.   Holiday Eating Survival Tips:  -Group instruction provided by PowerPoint slides, verbal discussion, and written materials to support subject matter. The instructor gives patients tips, tricks, and techniques to help them not only survive but enjoy the holidays despite the onslaught of food that accompanies the holidays.   Knowledge Questionnaire Score: Knowledge Questionnaire Score - 11/17/18 1020      Knowledge Questionnaire Score   Pre Score  21/24       Core Components/Risk Factors/Patient Goals at Admission: Personal Goals and Risk Factors at Admission - 11/17/18 1035       Core Components/Risk Factors/Patient Goals on Admission    Weight Management  Yes;Obesity;Weight Loss    Intervention  Weight Management: Provide education and appropriate resources to help participant work on and attain dietary goals.;Weight Management: Develop a combined nutrition and exercise program designed to reach desired caloric intake, while maintaining appropriate intake of nutrient and fiber, sodium and fats, and appropriate energy expenditure required for the weight goal.;Weight Management/Obesity: Establish reasonable short term and long term weight goals.;Obesity: Provide education and appropriate resources to help participant work on and attain dietary goals.    Admit Weight  244 lb 14.9 oz (111.1 kg)    Goal Weight: Short Term  238 lb (108 kg)    Goal Weight: Long Term  200 lb (90.7 kg)    Expected Outcomes  Short Term: Continue to assess and modify interventions until short term weight is achieved;Long Term: Adherence to nutrition and physical activity/exercise program aimed toward attainment of established weight goal;Weight Loss: Understanding of general recommendations for a balanced deficit meal plan, which promotes 1-2 lb weight loss per week and includes a negative energy balance of [phone] kcal/d;Understanding recommendations for meals to include 15-35% energy as protein, 25-35% energy from fat, 35-60% energy from carbohydrates, less than 200mg  of dietary cholesterol, 20-35 gm of total fiber daily;Understanding of distribution of calorie intake throughout the day with the consumption of 4-5 meals/snacks    Heart Failure  Yes    Intervention  Provide a combined exercise and nutrition program that is supplemented with education, support and counseling about heart failure. Directed toward relieving symptoms such as shortness of breath, decreased exercise tolerance, and extremity edema.    Expected Outcomes  Improve functional capacity of life;Short term: Attendance in program  2-3 days a week with increased exercise capacity. Reported lower sodium intake. Reported increased fruit and vegetable intake. Reports medication compliance.;Long term: Adoption of self-care skills and reduction of barriers for early signs and symptoms recognition and intervention leading to self-care maintenance.;Short term: Daily weights obtained and reported for increase. Utilizing diuretic protocols set by physician.    Hypertension  Yes    Intervention  Provide education on lifestyle modifcations including regular physical activity/exercise, weight management, moderate sodium restriction and increased consumption of fresh fruit, vegetables, and low fat dairy, alcohol moderation, and smoking cessation.;Monitor prescription use compliance.    Expected Outcomes  Short Term: Continued assessment and intervention until BP is < 140/62mm HG in hypertensive participants. < 130/23mm HG in hypertensive participants with diabetes, heart failure or chronic kidney disease.;Long Term: Maintenance of blood pressure at goal levels.    Lipids  Yes    Intervention  Provide education and support for participant on nutrition & aerobic/resistive exercise along with prescribed medications to achieve LDL 70mg , HDL >40mg .    Expected Outcomes  Short Term: Participant states understanding of desired cholesterol values and is compliant with medications prescribed. Participant is following exercise prescription and nutrition guidelines.;Long Term: Cholesterol controlled with medications as prescribed, with individualized exercise RX and with personalized nutrition plan. Value goals: LDL < , HDL > 40 mg.       Core Components/Risk Factors/Patient Goals Review:  Goals and Risk Factor Review    Row Name 12/04/18 1134             Core Components/Risk Factors/Patient Goals Review   Personal Goals Review  Weight Management/Obesity;Hypertension;Lipids;Heart Failure;Stress       Review  Exercise currently on hold as  department is closed per reccomended guidelines from the federal government to prevent the spread of COVID-19       Expected Outcomes  Pt will continue to participate in CR exercise, nutrition, and lifestyle modification opportunities once exercise resumes          Core Components/Risk Factors/Patient Goals at Discharge (Final Review):  Goals and Risk Factor Review - 12/04/18 1134      Core Components/Risk Factors/Patient Goals Review   Personal Goals Review  Weight Management/Obesity;Hypertension;Lipids;Heart Failure;Stress    Review  Exercise currently on hold as department is closed per reccomended guidelines from the federal government to prevent the spread of COVID-19    Expected Outcomes  Pt will continue to participate in CR exercise, nutrition, and lifestyle modification opportunities once exercise resumes       ITP Comments: ITP Comments    Row Name 11/17/18 0904 12/04/18 1127         ITP Comments  Medical Director- Dr. Armanda Magic, MD  30 Day ITP Review. Exercise currently on hold as department is closed per reccomended guidelines from the federal government to prevent the spread of COVID-19. Patient only attended orientation and has not begun exercise yet         Comments: See ITP comments.Gladstone Lighter, RN,BSN 12/08/2018 3:16 PM

## 2018-12-09 ENCOUNTER — Ambulatory Visit (HOSPITAL_COMMUNITY): Payer: Self-pay

## 2018-12-11 ENCOUNTER — Encounter (HOSPITAL_COMMUNITY): Payer: Self-pay | Admitting: Cardiology

## 2018-12-11 ENCOUNTER — Ambulatory Visit (HOSPITAL_COMMUNITY): Payer: Self-pay

## 2018-12-14 ENCOUNTER — Ambulatory Visit (HOSPITAL_COMMUNITY): Payer: Self-pay

## 2018-12-16 ENCOUNTER — Ambulatory Visit (HOSPITAL_COMMUNITY): Payer: Self-pay

## 2018-12-18 ENCOUNTER — Ambulatory Visit (HOSPITAL_COMMUNITY): Payer: Self-pay

## 2018-12-21 ENCOUNTER — Ambulatory Visit (HOSPITAL_COMMUNITY): Payer: Self-pay

## 2018-12-23 ENCOUNTER — Ambulatory Visit (HOSPITAL_COMMUNITY): Payer: Self-pay

## 2018-12-25 ENCOUNTER — Ambulatory Visit (HOSPITAL_COMMUNITY): Payer: Self-pay

## 2018-12-25 ENCOUNTER — Telehealth (HOSPITAL_COMMUNITY): Payer: Self-pay | Admitting: *Deleted

## 2018-12-25 NOTE — Telephone Encounter (Signed)
Called to notify patient that the cardiac and pulmonary rehabilitation department remains closed at this time due to COVID-19 restrictions. Pt verbalized understanding. Pt hasn't been walking thus far, but plans to start walking gradually with his nephew. Pt states that he received home exercise guidelines that were mailed to him.  Artist Pais, MS, ACSM CEP 12/25/2018 1323

## 2018-12-28 ENCOUNTER — Ambulatory Visit (HOSPITAL_COMMUNITY): Payer: Self-pay

## 2018-12-30 ENCOUNTER — Ambulatory Visit (HOSPITAL_COMMUNITY): Payer: Self-pay

## 2019-01-01 ENCOUNTER — Ambulatory Visit (HOSPITAL_COMMUNITY): Payer: Self-pay

## 2019-01-04 ENCOUNTER — Ambulatory Visit (HOSPITAL_COMMUNITY): Payer: Self-pay

## 2019-01-06 ENCOUNTER — Ambulatory Visit (HOSPITAL_COMMUNITY): Payer: Self-pay

## 2019-01-08 ENCOUNTER — Ambulatory Visit (HOSPITAL_COMMUNITY): Payer: Self-pay

## 2019-01-11 ENCOUNTER — Ambulatory Visit (HOSPITAL_COMMUNITY): Payer: Self-pay

## 2019-01-13 ENCOUNTER — Ambulatory Visit (HOSPITAL_COMMUNITY): Payer: Self-pay

## 2019-01-15 ENCOUNTER — Ambulatory Visit (HOSPITAL_COMMUNITY): Payer: Self-pay

## 2019-01-18 ENCOUNTER — Ambulatory Visit (HOSPITAL_COMMUNITY): Payer: Self-pay

## 2019-01-20 ENCOUNTER — Ambulatory Visit (HOSPITAL_COMMUNITY): Payer: Self-pay

## 2019-01-20 ENCOUNTER — Ambulatory Visit (INDEPENDENT_AMBULATORY_CARE_PROVIDER_SITE_OTHER): Payer: 59 | Admitting: *Deleted

## 2019-01-20 ENCOUNTER — Other Ambulatory Visit: Payer: Self-pay

## 2019-01-20 DIAGNOSIS — I5021 Acute systolic (congestive) heart failure: Secondary | ICD-10-CM | POA: Diagnosis not present

## 2019-01-20 DIAGNOSIS — Z950 Presence of cardiac pacemaker: Secondary | ICD-10-CM | POA: Diagnosis not present

## 2019-01-20 DIAGNOSIS — I428 Other cardiomyopathies: Secondary | ICD-10-CM | POA: Diagnosis not present

## 2019-01-20 LAB — CUP PACEART REMOTE DEVICE CHECK
Date Time Interrogation Session: 20200506104400
Implantable Lead Implant Date: 20190821
Implantable Lead Location: 753860
Implantable Lead Model: 7122
Implantable Pulse Generator Implant Date: 20190821
Pulse Gen Serial Number: 9824800

## 2019-01-22 ENCOUNTER — Ambulatory Visit (HOSPITAL_COMMUNITY): Payer: Self-pay

## 2019-01-25 ENCOUNTER — Ambulatory Visit (HOSPITAL_COMMUNITY): Payer: Self-pay

## 2019-01-26 ENCOUNTER — Encounter: Payer: Self-pay | Admitting: Cardiology

## 2019-01-26 NOTE — Progress Notes (Signed)
Remote ICD transmission.   

## 2019-01-27 ENCOUNTER — Ambulatory Visit (HOSPITAL_COMMUNITY): Payer: Self-pay

## 2019-01-29 ENCOUNTER — Ambulatory Visit (HOSPITAL_COMMUNITY): Payer: Self-pay

## 2019-02-01 ENCOUNTER — Ambulatory Visit (HOSPITAL_COMMUNITY): Payer: Self-pay

## 2019-02-03 ENCOUNTER — Ambulatory Visit (HOSPITAL_COMMUNITY): Payer: Self-pay

## 2019-02-05 ENCOUNTER — Ambulatory Visit (HOSPITAL_COMMUNITY): Payer: Self-pay

## 2019-02-09 ENCOUNTER — Encounter (HOSPITAL_COMMUNITY): Payer: Self-pay | Admitting: Cardiology

## 2019-02-10 ENCOUNTER — Telehealth (HOSPITAL_COMMUNITY): Payer: Self-pay | Admitting: *Deleted

## 2019-02-10 ENCOUNTER — Ambulatory Visit (HOSPITAL_COMMUNITY): Payer: Self-pay

## 2019-02-12 ENCOUNTER — Ambulatory Visit (HOSPITAL_COMMUNITY): Payer: Self-pay

## 2019-02-15 ENCOUNTER — Ambulatory Visit (HOSPITAL_COMMUNITY): Payer: Self-pay

## 2019-02-15 ENCOUNTER — Telehealth (HOSPITAL_COMMUNITY): Payer: Self-pay

## 2019-02-15 NOTE — Telephone Encounter (Signed)
° °        Confirm Consent - In the setting of the current Covid19 crisis, you are scheduled for a phone visit with your Cardiac or Pulmonary team member.  Just as we do with many in-gym visits, in order for you to participate in this visit, we must obtain consent.  If you'd like, I can send this to your mychart (if signed up) or email for you to review.  Otherwise, I can obtain your verbal consent now.  By agreeing to a telephone visit, we'd like you to understand that the technology does not allow for your Cardiac or Pulmonary Rehab team member to perform a physical assessment, and thus may limit their ability to fully assess your ability to perform exercise programs. If your provider identifies any concerns that need to be evaluated in person, we will make arrangements to do so.  Finally, though the technology is pretty good, we cannot assure that it will always work on either your or our end and we cannot ensure that we have a secure connection.  Cardiac and Pulmonary Rehab Telehealth visits and At Home cardiac and pulmonary rehab are provided at no cost to you.               Are you willing to proceed?" STAFF: Did the patient verbally acknowledge consent to telehealth visit? Document YES/NO here: Yes      Jessica C.   Cardiac and Pulmonary Rehab Staff   D6/09/2018 T2:43PM

## 2019-02-17 ENCOUNTER — Encounter (HOSPITAL_COMMUNITY): Payer: 59

## 2019-02-17 ENCOUNTER — Telehealth (HOSPITAL_COMMUNITY): Payer: Self-pay | Admitting: *Deleted

## 2019-02-17 ENCOUNTER — Ambulatory Visit (HOSPITAL_COMMUNITY): Payer: Self-pay

## 2019-02-17 NOTE — Telephone Encounter (Signed)
Called pt to set up virtual cardiac rehab application.  Pt is currently having an issue with his phone accessing the application store and is requesting to reschedule time to call on Friday, 02/19/2019 at 1600.  Appointment made.

## 2019-02-19 ENCOUNTER — Encounter (HOSPITAL_COMMUNITY): Payer: 59

## 2019-02-19 ENCOUNTER — Ambulatory Visit (HOSPITAL_COMMUNITY): Payer: Self-pay

## 2019-02-22 ENCOUNTER — Telehealth (HOSPITAL_COMMUNITY): Payer: Self-pay | Admitting: *Deleted

## 2019-02-22 ENCOUNTER — Encounter (HOSPITAL_COMMUNITY): Payer: 59

## 2019-02-22 ENCOUNTER — Ambulatory Visit (HOSPITAL_COMMUNITY): Payer: Self-pay

## 2019-02-22 NOTE — Telephone Encounter (Signed)
Pt called to set up app for virtual cardiac rehab.  Pt continues to have issues regarding being able to download the app from the application store.  Pt is planning to meet with this work's IT department but has not done so.  Pt eager to participate in app  Requested from patient to contact CR when he has resolved his technical issues and is able to download the app.  Verbalized understanding.

## 2019-02-24 ENCOUNTER — Ambulatory Visit (HOSPITAL_COMMUNITY): Payer: Self-pay

## 2019-02-26 ENCOUNTER — Telehealth (HOSPITAL_COMMUNITY): Payer: Self-pay | Admitting: *Deleted

## 2019-02-26 NOTE — Telephone Encounter (Signed)
surgical clearance signed and faxed to EP clinic to finish filling out

## 2019-03-08 ENCOUNTER — Telehealth (HOSPITAL_COMMUNITY): Payer: Self-pay | Admitting: *Deleted

## 2019-03-09 ENCOUNTER — Encounter (HOSPITAL_COMMUNITY): Payer: Self-pay | Admitting: *Deleted

## 2019-03-09 ENCOUNTER — Telehealth (HOSPITAL_COMMUNITY): Payer: Self-pay | Admitting: *Deleted

## 2019-03-09 NOTE — Telephone Encounter (Signed)
Pt returned call from message left earlier on voicemail.  Returned pt call regarding virtual cardiac rehab. Left  message requesting a call back.  Pt to call back with the following information. 1. His interest in following through with scheduled appt for virtual cardia rehab intake.  2. Has hehad the IT department reconfigured his phone so that he can download app. 3. Cancel virtual cardiac rehab participation and wait for in facility cardiac rehab with the understanding strict attendance and in facility visits will be for a shorter duration with the emphasis of continued home exercise committment.  Contact information provided. Cherre Huger, BSN Cardiac and Training and development officer

## 2019-03-09 NOTE — Progress Notes (Signed)
Mr Forand attended orientation on 11/17/18. Cardiac rehab has been closed due to the [place] 19 pandemic. Patient has been contacted about virtual cardiac rehab. Will discharge from phase 2.Barnet Pall, RN,BSN 03/09/2019 11:48 AM

## 2019-03-29 ENCOUNTER — Other Ambulatory Visit (HOSPITAL_COMMUNITY): Payer: Self-pay | Admitting: Cardiology

## 2019-03-29 ENCOUNTER — Other Ambulatory Visit: Payer: Self-pay | Admitting: Student

## 2019-03-31 ENCOUNTER — Encounter (HOSPITAL_COMMUNITY): Payer: 59 | Admitting: Cardiology

## 2019-04-15 DIAGNOSIS — F330 Major depressive disorder, recurrent, mild: Secondary | ICD-10-CM | POA: Insufficient documentation

## 2019-04-15 DIAGNOSIS — F3342 Major depressive disorder, recurrent, in full remission: Secondary | ICD-10-CM | POA: Insufficient documentation

## 2019-04-21 ENCOUNTER — Encounter: Payer: 59 | Admitting: *Deleted

## 2019-04-23 ENCOUNTER — Telehealth: Payer: Self-pay

## 2019-04-23 NOTE — Telephone Encounter (Signed)
Left message for patient to remind of missed remote transmission.  

## 2019-04-27 ENCOUNTER — Encounter: Payer: Self-pay | Admitting: Cardiology

## 2019-05-04 ENCOUNTER — Telehealth: Payer: Self-pay

## 2019-05-04 NOTE — Telephone Encounter (Signed)
Spoke with pt who is agreeable with moving appt scheduled for 8/25 at 10:30am to 9:15am on same date. Appt changed

## 2019-05-06 ENCOUNTER — Ambulatory Visit (INDEPENDENT_AMBULATORY_CARE_PROVIDER_SITE_OTHER): Payer: 59 | Admitting: *Deleted

## 2019-05-06 DIAGNOSIS — I469 Cardiac arrest, cause unspecified: Secondary | ICD-10-CM

## 2019-05-06 DIAGNOSIS — I429 Cardiomyopathy, unspecified: Secondary | ICD-10-CM

## 2019-05-06 DIAGNOSIS — Z95810 Presence of automatic (implantable) cardiac defibrillator: Secondary | ICD-10-CM

## 2019-05-06 DIAGNOSIS — I5021 Acute systolic (congestive) heart failure: Secondary | ICD-10-CM

## 2019-05-09 LAB — CUP PACEART REMOTE DEVICE CHECK
Date Time Interrogation Session: 20200823100309
Implantable Lead Implant Date: 20190821
Implantable Lead Location: 753860
Implantable Lead Model: 7122
Implantable Pulse Generator Implant Date: 20190821
Pulse Gen Serial Number: 9824800

## 2019-05-11 ENCOUNTER — Ambulatory Visit: Payer: 59 | Admitting: Cardiovascular Disease

## 2019-05-13 NOTE — Progress Notes (Signed)
Remote ICD transmission.   

## 2019-06-13 ENCOUNTER — Other Ambulatory Visit: Payer: Self-pay | Admitting: Cardiovascular Disease

## 2019-07-05 ENCOUNTER — Other Ambulatory Visit (HOSPITAL_COMMUNITY): Payer: Self-pay

## 2019-07-05 ENCOUNTER — Other Ambulatory Visit: Payer: Self-pay

## 2019-07-05 ENCOUNTER — Encounter (HOSPITAL_COMMUNITY): Payer: Self-pay | Admitting: Cardiology

## 2019-07-05 ENCOUNTER — Ambulatory Visit (HOSPITAL_COMMUNITY)
Admission: RE | Admit: 2019-07-05 | Discharge: 2019-07-05 | Disposition: A | Discharge status: Home / Self Care | Payer: 59 | Source: Ambulatory Visit | Attending: Cardiology | Admitting: Cardiology

## 2019-07-05 VITALS — BP 116/60 | HR 85 | Wt 250.4 lb

## 2019-07-05 DIAGNOSIS — Z7984 Long term (current) use of oral hypoglycemic drugs: Secondary | ICD-10-CM | POA: Diagnosis not present

## 2019-07-05 DIAGNOSIS — F329 Major depressive disorder, single episode, unspecified: Secondary | ICD-10-CM | POA: Insufficient documentation

## 2019-07-05 DIAGNOSIS — I428 Other cardiomyopathies: Secondary | ICD-10-CM | POA: Diagnosis not present

## 2019-07-05 DIAGNOSIS — Z79899 Other long term (current) drug therapy: Secondary | ICD-10-CM | POA: Diagnosis not present

## 2019-07-05 DIAGNOSIS — Z8249 Family history of ischemic heart disease and other diseases of the circulatory system: Secondary | ICD-10-CM | POA: Diagnosis not present

## 2019-07-05 DIAGNOSIS — F419 Anxiety disorder, unspecified: Secondary | ICD-10-CM | POA: Diagnosis not present

## 2019-07-05 DIAGNOSIS — I5082 Biventricular heart failure: Secondary | ICD-10-CM | POA: Diagnosis not present

## 2019-07-05 DIAGNOSIS — F1010 Alcohol abuse, uncomplicated: Secondary | ICD-10-CM | POA: Insufficient documentation

## 2019-07-05 DIAGNOSIS — G4733 Obstructive sleep apnea (adult) (pediatric): Secondary | ICD-10-CM | POA: Diagnosis not present

## 2019-07-05 DIAGNOSIS — Z95810 Presence of automatic (implantable) cardiac defibrillator: Secondary | ICD-10-CM | POA: Diagnosis not present

## 2019-07-05 DIAGNOSIS — I5022 Chronic systolic (congestive) heart failure: Secondary | ICD-10-CM | POA: Insufficient documentation

## 2019-07-05 LAB — BASIC METABOLIC PANEL
Anion gap: 9 (ref 5–15)
BUN: 12 mg/dL (ref 6–20)
CO2: 23 mmol/L (ref 22–32)
Calcium: 9 mg/dL (ref 8.9–10.3)
Chloride: 97 mmol/L — ABNORMAL LOW (ref 98–111)
Creatinine, Ser: 0.82 mg/dL (ref 0.61–1.24)
GFR calc Af Amer: >60 mL/min (ref >60)
GFR calc non Af Amer: >60 mL/min (ref >60)
Glucose, Bld: 107 mg/dL — ABNORMAL HIGH (ref 70–99)
Potassium: 3.6 mmol/L (ref 3.5–5.1)
Sodium: 129 mmol/L — ABNORMAL LOW (ref 135–145)

## 2019-07-05 MED ORDER — DAPAGLIFLOZIN PROPANEDIOL 10 MG PO TABS: 10.0000 mg | ORAL_TABLET | Freq: Every day | ORAL | 11 refills | Status: DC

## 2019-07-05 MED ORDER — FUROSEMIDE 40 MG PO TABS: ORAL_TABLET | ORAL | 6 refills | Status: DC

## 2019-07-05 NOTE — Patient Instructions (Addendum)
START Farxiga 10mg  (1 tab) daily  DECREASE Lasix to 40mg  (1 tab) in the morning and 20mg  (1/2 tab) in the evening.   Your physician has requested that you have an echocardiogram in 12/20. Echocardiography is a painless test that uses sound waves to create images of your heart. It provides your doctor with information about the size and shape of your heart and how well your heart's chambers and valves are working. This procedure takes approximately one hour. There are no restrictions for this procedure.  Your physician has recommended that you have a cardiopulmonary stress test (CPX). CPX testing is a non-invasive measurement of heart and lung function. It replaces a traditional treadmill stress test. This type of test provides a tremendous amount of information that relates not only to your present condition but also for future outcomes. This test combines measurements of you ventilation, respiratory gas exchange in the lungs, electrocardiogram (EKG), blood pressure and physical response before, during, and following an exercise protocol.  Labs today and repeat in 10 days We will only contact you if something comes back abnormal or we need to make some changes. Otherwise no news is good news!  You have been referred back to Cardiac Rehab. They will call you to schedule your appointment.   Please call Dr Landis Gandy office regarding your challenges with the CPAP machine. Their number is [phone]  Your physician recommends that you schedule a follow-up appointment in: 3 months with Dr Aundra Dubin.   At the [facility], you and your health needs are our priority. As part of our continuing mission to provide you with exceptional heart care, we have created designated Provider Care Teams. These Care Teams include your primary Cardiologist (physician) and Advanced Practice Providers (APPs- Physician Assistants and Nurse Practitioners) who all work together to provide you with the care you  need, when you need it.   You may see any of the following providers on your designated Care Team at your next follow up: Marland Kitchen Dr Glori Bickers . Dr Loralie Champagne . Darrick Grinder, NP . Lyda Jester, PA   Please be sure to bring in all your medications bottles to every appointment.

## 2019-07-06 NOTE — Progress Notes (Signed)
PCP: Dr. Caprice Beaver Cardiology: Dr. Gwenlyn Found HF Cardiology: Dr. Aundra Dubin  58 y.o. with history of nonischemic cardiomyopathy and OSA was referred by Dr. Gwenlyn Found for evaluation of CHF.  He was initially diagnosed in early 2/18.  He had URI symptoms then developed dyspnea and put on weight. Echo was done in 2/18, showing EF 20-25%.  LHC in 2/18 showed normal coronaries. He was started on treatment for cardiomyopathy, and EF rose to 40% by 6/18. However, most recent echo in 6/19 showed EF back down to 15-20% with severe RV dilation/dysfunction.  CPX was done in 7/19, showed moderate functional limitation though submaximal.  He had St Jude ICD placed in 8/19.   Patient returns for followup of CHF.  Weight is up 14 lbs.  He is not exercising much.  Working at from home.  He is short of breath after walking about 1/2 mile.  No orthopnea/PND.  No chest pain.  Still with left shoulder pain, recently had hardware removed and may have to have shoulder replacement.  He is drinking about a 6 pack/day now, his drinking has worsened over the last 6 months but he wants to try to quit. He questions how well his CPAP is working given a lot of coughing in the morning.    Labs (7/19): K 4.9, creatinine 0.91 Labs (8/19): Na 128, K 4.9, creatinine 0.72 Labs (9/19): LDL 45 Labs (12/19): K 3.9, creatinine 0.94  Labs (1/20): K 4.5, creatinine 0.94, hgb 13.2  PMH: 1. OSA: uses CPAP.  2. Depression 3. Chronic systolic CHF: Nonischemic cardiomyopathy.  Diagnosed around 2/18.  - LHC (2/18): Normal coronaries.  - Echo (2/18): EF 20-25% - Echo (6/18): EF 40% - Echo (12/18): EF 35-40% - Echo (6/19): LV mildly dilated, EF 15-20%, RV severely dilated with severely decreased systolic function.  - CPX (7/19): peak VO2 16, VE/VCO2 slope 37, RER 1.03 => submaximal but probably moderate functional limitation.  - Cardiac MRI (7/19): EF 29% with mild LV dilation, mildly decreased RV systolic function EF 25%, mid-wall LGE throughtout the  septal wall and the inferor wall.  - St Jude ICD placed in 8/19.  - Echo (12/19): EF 25-30%, diffuse hypokinesis, normal RV size and systolic function.  4. ETOH abuse.   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 No drugs. [place].   FH: Sister with congenital heart abnormality.  No other cardiac disease.   ROS: All systems reviewed and negative except as per HPI.  Current Outpatient Medications  Medication Sig Dispense Refill  . acetaminophen (TYLENOL) 325 MG tablet Take 650 mg by mouth every 6 (six) hours as needed for mild pain.    . carvedilol (COREG) 25 MG tablet TAKE 1 TABLET (25 MG TOTAL) BY MOUTH 2 (TWO) TIMES DAILY WITH A MEAL. 60 tablet 6  . cetirizine (ZYRTEC) 10 MG chewable tablet Chew 10 mg by mouth daily.    . furosemide (LASIX) 40 MG tablet Take 1 tablet (40 mg total) by mouth every morning AND 0.5 tablets (20 mg total) every evening. 45 tablet 6  . KLOR-CON M20 20 MEQ tablet TAKE 1 TABLET BY MOUTH EVERY DAY 30 tablet 5  . LORazepam (ATIVAN) 1 MG tablet Take 0.5-1 mg by mouth every 8 (eight) hours as needed for anxiety.    . Multiple Vitamins-Minerals (MULTIVITAMIN WITH MINERALS) tablet Take 1 tablet by mouth daily.    . rosuvastatin (CRESTOR) 20 MG tablet Take 20 mg by mouth daily.    Marland Kitchen  sacubitril-valsartan (ENTRESTO) 97-103 MG Take 1 tablet by mouth 2 (two) times daily. 60 tablet 6  . sertraline (ZOLOFT) 50 MG tablet TAKE 1 TABLET BY MOUTH EVERY DAY 90 tablet 3  . sildenafil (VIAGRA) 100 MG tablet Take 0.5 tablets (50 mg total) by mouth at bedtime as needed for erectile dysfunction. 10 tablet 6  . spironolactone (ALDACTONE) 25 MG tablet Take 1 tablet (25 mg total) by mouth daily. 30 tablet 3  . dapagliflozin propanediol (FARXIGA) 10 MG TABS tablet Take 10 mg by mouth daily before breakfast. 30 tablet 11   No current facility-administered medications for this encounter.    BP 116/60   Pulse 85   Wt  113.6 kg (250 lb 6.4 oz)   SpO2 97%   BMI 35.93 kg/m  General: NAD Neck: Thick, no JVD, no thyromegaly or thyroid nodule.  Lungs: Clear to auscultation bilaterally with normal respiratory effort. CV: Nondisplaced PMI.  Heart regular S1/S2, no S3/S4, no murmur.  No peripheral edema.  No carotid bruit.  Normal pedal pulses.  Abdomen: Soft, nontender, no hepatosplenomegaly, no distention.  Skin: Intact without lesions or rashes.  Neurologic: Alert and oriented x 3.  Psych: Normal affect. Extremities: No clubbing or cyanosis.  HEENT: Normal.   Assessment/Plan: 1. Chronic systolic CHF: Nonischemic cardiomyopathy with biventricular failure.  Cath in 2/18 with no coronary disease.  Echo (6/19) with EF 15-20%, severe RV dilation/dysfunction.  CPX 7/19 submaximal but probably moderate functional limitation.  Cardiac MRI in 7/19 showed EF 29%.  LGE pattern was concerning for prior viral myocarditis. Echo in 12/19 showed EF 25-30%.  Now with St Jude ICD.  Cause of CMP is uncertain.  However, he has been drinking more heavily recently than in the past which may worsen his LV function.  No family history of cardiomyopathy.  NYHA class II symptoms. Not volume overloaded on exam.  - I will start him on dapagliflozin today.  I think that he can decrease Lasix to 40 qam/20 qpm. BMET today and again in 2 wks.  - Continue spironolactone 25 mg daily.   - Continue Coreg 25 mg bid.  - Continue Entresto 97/103 bid.   - I will refer him back to cardiac rehab as he did not finish the program prior it being shunt down by the pandemic.   - Close followup, may need advanced therapies down the road. Repeat echo in 12/20 to reassess LV function. I will also arrange for repeat CPX.  - He needs to slowly cut back on ETOH intake with goal to quit.  2. OSA: Continue CPAP.  - He is concerned that his CPAP is not working properly given cough in the morning when he first awakens.  I will ask sleep medicine to reassess him.  3.  Anxiety: He is on sertraline.  4. ETOH Abuse: We discussed gradual cessation today. I suspect this is contributing to his weight gain and it may be depressing his LV function.   Marca Ancona 07/06/2019

## 2019-07-07 ENCOUNTER — Encounter (HOSPITAL_COMMUNITY): Payer: Self-pay | Admitting: *Deleted

## 2019-07-07 NOTE — Progress Notes (Signed)
Received referral for this pt who is well known to cardiac rehab staff from previous attempts to participate in Cardiac rehab from Dr. Aundra Dubin with the diagnosis of Chronic Systolic Heart Failure.  Pt seen in follow up with the heart failure clinic on 10/19.  Reviewed medical history and pt is appropriate to contact for scheduling with the understanding he must be compliant with completing scheduled appointments, consistent home exercise and follow up with his health care providers particular those who are managing his anxiety and depression.  Will have support staff verify insurance/benefits and scheduling.  Pt covid risk score is 4.  Cherre Huger, BSN Cardiac and Training and development officer

## 2019-07-15 ENCOUNTER — Other Ambulatory Visit (HOSPITAL_COMMUNITY): Payer: 59

## 2019-07-15 ENCOUNTER — Telehealth (HOSPITAL_COMMUNITY): Payer: Self-pay | Admitting: Pharmacist

## 2019-07-16 ENCOUNTER — Ambulatory Visit (HOSPITAL_COMMUNITY)
Admission: RE | Admit: 2019-07-16 | Discharge: 2019-07-16 | Disposition: A | Discharge status: Home / Self Care | Payer: 59 | Source: Ambulatory Visit | Attending: Internal Medicine | Admitting: Internal Medicine

## 2019-07-16 ENCOUNTER — Other Ambulatory Visit: Payer: Self-pay

## 2019-07-16 DIAGNOSIS — I5022 Chronic systolic (congestive) heart failure: Secondary | ICD-10-CM | POA: Insufficient documentation

## 2019-07-16 LAB — BASIC METABOLIC PANEL
Anion gap: 10 (ref 5–15)
BUN: 11 mg/dL (ref 6–20)
CO2: 24 mmol/L (ref 22–32)
Calcium: 9.4 mg/dL (ref 8.9–10.3)
Chloride: 103 mmol/L (ref 98–111)
Creatinine, Ser: 0.99 mg/dL (ref 0.61–1.24)
GFR calc Af Amer: >60 mL/min (ref >60)
GFR calc non Af Amer: >60 mL/min (ref >60)
Glucose, Bld: 107 mg/dL — ABNORMAL HIGH (ref 70–99)
Potassium: 4.4 mmol/L (ref 3.5–5.1)
Sodium: 137 mmol/L (ref 135–145)

## 2019-07-16 NOTE — Telephone Encounter (Signed)
Cardiac Rehab Medication Review by a Pharmacist  Does the patient  feel that his/her medications are working for him/her?  yes  Has the patient been experiencing any side effects to the medications prescribed?  no  Does the patient measure his/her own blood pressure or blood glucose at home?  no   Does the patient have any problems obtaining medications due to transportation or finances?   no  Understanding of regimen: good Understanding of indications: fair Potential of compliance: good  Richardine Service, PharmD PGY1 Pharmacy Resident Phone: [phone] 07/16/2019  3:27 PM

## 2019-07-19 ENCOUNTER — Telehealth (HOSPITAL_COMMUNITY): Payer: Self-pay | Admitting: Internal Medicine

## 2019-07-20 ENCOUNTER — Ambulatory Visit (HOSPITAL_COMMUNITY): Payer: 59

## 2019-07-21 ENCOUNTER — Other Ambulatory Visit (HOSPITAL_COMMUNITY): Payer: Self-pay | Admitting: Cardiology

## 2019-07-26 ENCOUNTER — Ambulatory Visit (HOSPITAL_COMMUNITY): Payer: 59

## 2019-07-28 ENCOUNTER — Ambulatory Visit (HOSPITAL_COMMUNITY): Payer: 59

## 2019-07-29 ENCOUNTER — Inpatient Hospital Stay (HOSPITAL_COMMUNITY): Admission: RE | Admit: 2019-07-29 | Payer: 59 | Source: Ambulatory Visit

## 2019-07-30 ENCOUNTER — Ambulatory Visit (HOSPITAL_COMMUNITY): Payer: 59

## 2019-08-02 ENCOUNTER — Ambulatory Visit (HOSPITAL_COMMUNITY): Payer: 59

## 2019-08-02 ENCOUNTER — Encounter (HOSPITAL_COMMUNITY): Payer: 59

## 2019-08-04 ENCOUNTER — Ambulatory Visit (HOSPITAL_COMMUNITY): Payer: 59

## 2019-08-05 ENCOUNTER — Ambulatory Visit (INDEPENDENT_AMBULATORY_CARE_PROVIDER_SITE_OTHER): Payer: 59 | Admitting: *Deleted

## 2019-08-05 DIAGNOSIS — I428 Other cardiomyopathies: Secondary | ICD-10-CM | POA: Diagnosis not present

## 2019-08-05 DIAGNOSIS — I5021 Acute systolic (congestive) heart failure: Secondary | ICD-10-CM

## 2019-08-06 ENCOUNTER — Ambulatory Visit (HOSPITAL_COMMUNITY): Payer: 59

## 2019-08-06 LAB — CUP PACEART REMOTE DEVICE CHECK
Date Time Interrogation Session: 20201120160116
Implantable Lead Implant Date: 20190821
Implantable Lead Location: 753860
Implantable Lead Model: 7122
Implantable Pulse Generator Implant Date: 20190821
Pulse Gen Serial Number: 9824800

## 2019-08-09 ENCOUNTER — Ambulatory Visit (HOSPITAL_COMMUNITY): Payer: 59

## 2019-08-10 ENCOUNTER — Telehealth: Payer: Self-pay

## 2019-08-10 NOTE — Telephone Encounter (Signed)
The pt states he called [employer] tech support and the person could not help him. I tried to help him but he could not stay near the monitor to get my help. I told him if he is not near the monitor than the transmission is not going to send. He said he will talk to me next week.

## 2019-08-11 ENCOUNTER — Ambulatory Visit (HOSPITAL_COMMUNITY): Payer: 59

## 2019-08-13 ENCOUNTER — Ambulatory Visit (HOSPITAL_COMMUNITY): Payer: 59

## 2019-08-16 ENCOUNTER — Ambulatory Visit (HOSPITAL_COMMUNITY): Payer: 59

## 2019-08-18 ENCOUNTER — Ambulatory Visit (HOSPITAL_COMMUNITY): Payer: 59

## 2019-08-20 ENCOUNTER — Ambulatory Visit (HOSPITAL_COMMUNITY): Payer: 59

## 2019-08-23 ENCOUNTER — Ambulatory Visit (HOSPITAL_COMMUNITY): Payer: 59

## 2019-08-24 ENCOUNTER — Telehealth (HOSPITAL_COMMUNITY): Payer: Self-pay

## 2019-08-24 ENCOUNTER — Other Ambulatory Visit: Payer: Self-pay

## 2019-08-24 ENCOUNTER — Ambulatory Visit (HOSPITAL_COMMUNITY)
Admission: RE | Admit: 2019-08-24 | Discharge: 2019-08-24 | Disposition: A | Discharge status: Home / Self Care | Payer: 59 | Source: Ambulatory Visit | Attending: Cardiology | Admitting: Cardiology

## 2019-08-24 DIAGNOSIS — E785 Hyperlipidemia, unspecified: Secondary | ICD-10-CM | POA: Diagnosis not present

## 2019-08-24 DIAGNOSIS — I5022 Chronic systolic (congestive) heart failure: Secondary | ICD-10-CM | POA: Diagnosis not present

## 2019-08-24 DIAGNOSIS — F1010 Alcohol abuse, uncomplicated: Secondary | ICD-10-CM | POA: Diagnosis not present

## 2019-08-24 DIAGNOSIS — I428 Other cardiomyopathies: Secondary | ICD-10-CM | POA: Diagnosis not present

## 2019-08-24 DIAGNOSIS — I110 Hypertensive heart disease with heart failure: Secondary | ICD-10-CM | POA: Diagnosis present

## 2019-08-24 DIAGNOSIS — G4730 Sleep apnea, unspecified: Secondary | ICD-10-CM | POA: Insufficient documentation

## 2019-08-24 MED ORDER — PERFLUTREN LIPID MICROSPHERE
1.0000 mL | INTRAVENOUS | Status: AC | PRN
  Administered 2019-08-24: 5 mL via INTRAVENOUS
  Filled 2019-08-24: qty 10

## 2019-08-24 NOTE — Telephone Encounter (Signed)
He needs appt with [place] this week (me or PA/NP).

## 2019-08-24 NOTE — Progress Notes (Signed)
  Echocardiogram 2D Echocardiogram has been performed with [facility].  John [name] 08/24/2019, 4:17 PM

## 2019-08-24 NOTE — Telephone Encounter (Signed)
Called patient and left detailed message that an appt was made for Thursday 8:40am to see MD.

## 2019-08-24 NOTE — Telephone Encounter (Signed)
Pt was seen today by NP Devett Williams for pre op eval/clearance for shoulder surgery on Friday. On assessment, EKG revealed that he was in Afib with RVR HR as high at 119.  Pt did not have any symptoms and other vitals were stable.  Pt is having an ECHO today and she is requesting that he is seen by MD ASAP.   She will have EKG faxed to our office today. She states his surgery will most likely be postponed because of this new diagnosis.   Please advise.

## 2019-08-25 ENCOUNTER — Ambulatory Visit (HOSPITAL_COMMUNITY): Payer: 59

## 2019-08-25 MED FILL — Perflutren Lipid Microsphere IV Susp 1.1 MG/ML: INTRAVENOUS | Qty: 10 | Status: AC

## 2019-08-25 NOTE — Telephone Encounter (Signed)
Spoke with patient this morning, appt confirmed for tomorrow. Pt amenable to date and time. Verbalized appreciation

## 2019-08-26 ENCOUNTER — Other Ambulatory Visit: Payer: Self-pay

## 2019-08-26 ENCOUNTER — Ambulatory Visit (HOSPITAL_COMMUNITY)
Admission: RE | Admit: 2019-08-26 | Discharge: 2019-08-26 | Disposition: A | Discharge status: Home / Self Care | Payer: 59 | Source: Ambulatory Visit | Attending: Cardiology | Admitting: Cardiology

## 2019-08-26 VITALS — BP 84/57 | HR 88

## 2019-08-26 DIAGNOSIS — I4819 Other persistent atrial fibrillation: Secondary | ICD-10-CM | POA: Diagnosis not present

## 2019-08-26 DIAGNOSIS — M19012 Primary osteoarthritis, left shoulder: Secondary | ICD-10-CM | POA: Diagnosis not present

## 2019-08-26 DIAGNOSIS — F329 Major depressive disorder, single episode, unspecified: Secondary | ICD-10-CM | POA: Diagnosis not present

## 2019-08-26 DIAGNOSIS — Z95810 Presence of automatic (implantable) cardiac defibrillator: Secondary | ICD-10-CM | POA: Diagnosis not present

## 2019-08-26 DIAGNOSIS — G4733 Obstructive sleep apnea (adult) (pediatric): Secondary | ICD-10-CM | POA: Diagnosis not present

## 2019-08-26 DIAGNOSIS — Z7901 Long term (current) use of anticoagulants: Secondary | ICD-10-CM | POA: Insufficient documentation

## 2019-08-26 DIAGNOSIS — I4891 Unspecified atrial fibrillation: Secondary | ICD-10-CM

## 2019-08-26 DIAGNOSIS — I428 Other cardiomyopathies: Secondary | ICD-10-CM | POA: Diagnosis not present

## 2019-08-26 DIAGNOSIS — F419 Anxiety disorder, unspecified: Secondary | ICD-10-CM | POA: Diagnosis not present

## 2019-08-26 DIAGNOSIS — I5022 Chronic systolic (congestive) heart failure: Secondary | ICD-10-CM

## 2019-08-26 DIAGNOSIS — F1010 Alcohol abuse, uncomplicated: Secondary | ICD-10-CM | POA: Diagnosis not present

## 2019-08-26 DIAGNOSIS — Z79899 Other long term (current) drug therapy: Secondary | ICD-10-CM | POA: Insufficient documentation

## 2019-08-26 DIAGNOSIS — I5082 Biventricular heart failure: Secondary | ICD-10-CM | POA: Insufficient documentation

## 2019-08-26 DIAGNOSIS — Z7984 Long term (current) use of oral hypoglycemic drugs: Secondary | ICD-10-CM | POA: Diagnosis not present

## 2019-08-26 LAB — COMPREHENSIVE METABOLIC PANEL
ALT: 36 U/L (ref 0–44)
AST: 37 U/L (ref 15–41)
Albumin: 3.4 g/dL — ABNORMAL LOW (ref 3.5–5.0)
Alkaline Phosphatase: 52 U/L (ref 38–126)
Anion gap: 9 (ref 5–15)
BUN: 18 mg/dL (ref 6–20)
CO2: 21 mmol/L — ABNORMAL LOW (ref 22–32)
Calcium: 8.9 mg/dL (ref 8.9–10.3)
Chloride: 105 mmol/L (ref 98–111)
Creatinine, Ser: 1.06 mg/dL (ref 0.61–1.24)
GFR calc Af Amer: >60 mL/min (ref >60)
GFR calc non Af Amer: >60 mL/min (ref >60)
Glucose, Bld: 121 mg/dL — ABNORMAL HIGH (ref 70–99)
Potassium: 4.4 mmol/L (ref 3.5–5.1)
Sodium: 135 mmol/L (ref 135–145)
Total Bilirubin: 0.4 mg/dL (ref 0.3–1.2)
Total Protein: 6.2 g/dL — ABNORMAL LOW (ref 6.5–8.1)

## 2019-08-26 LAB — CBC
HCT: 39.4 % (ref 39.0–52.0)
Hemoglobin: 12.6 g/dL — ABNORMAL LOW (ref 13.0–17.0)
MCH: 33.7 pg (ref 26.0–34.0)
MCHC: 32 g/dL (ref 30.0–36.0)
MCV: 105.3 fL — ABNORMAL HIGH (ref 80.0–100.0)
Platelets: 263 10*3/uL (ref 150–400)
RBC: 3.74 MIL/uL — ABNORMAL LOW (ref 4.22–5.81)
RDW: 13.7 % (ref 11.5–15.5)
WBC: 9.2 10*3/uL (ref 4.0–10.5)
nRBC: 0 % (ref 0.0–0.2)

## 2019-08-26 MED ORDER — CARVEDILOL 12.5 MG PO TABS: 12.5000 mg | ORAL_TABLET | Freq: Two times a day (BID) | ORAL | 6 refills | Status: DC

## 2019-08-26 MED ORDER — APIXABAN 5 MG PO TABS: 5.0000 mg | ORAL_TABLET | Freq: Two times a day (BID) | ORAL | 6 refills | Status: DC

## 2019-08-26 MED ORDER — FUROSEMIDE 40 MG PO TABS: 40.0000 mg | ORAL_TABLET | Freq: Every day | ORAL | 5 refills | Status: DC

## 2019-08-26 MED ORDER — SACUBITRIL-VALSARTAN 49-51 MG PO TABS: 1.0000 | ORAL_TABLET | Freq: Two times a day (BID) | ORAL | 5 refills | Status: DC

## 2019-08-26 NOTE — Patient Instructions (Addendum)
START Eliquis 5mg  (1 tab) twice a day.  YOU CANNOT MISS A DOSE  CHANGE COREG: START Coreg to 12.5mg  (1 tab) twice a day. DO NOT TAKE ANY MORE FOR TODAY.  START NEW DOSE TOMORROW  CONTINUE taking Lasix (Furosemide) 40mg  (1 tab daily)  DECREASE Entresto to 49/51mg  (1 tab) twice a day. DO NOT TAKE ANYMORE FOR TODAY. START NEW DOSE TOMORROW  PLEASE GRADUALLY CUT BACK ON DRINKING ALCOHOL  COVID Test: you are scheduled for covid screening on Monday December 14th, 2020 at 11:55am.  You have to remain quarantined from this day until you come in for procedure.   Your physician has requested that you have a TEE/Cardioversion. During a TEE, sound waves are used to create images of your heart. It provides your doctor with information about the size and shape of your heart and how well your heart's chambers and valves are working. In this test, a transducer is attached to the end of a flexible tube that is guided down you throat and into your esophagus (the tube leading from your mouth to your stomach) to get a more detailed image of your heart. Once the TEE has determined that a blood clot is not present, the cardioversion begins. Electrical Cardioversion uses a jolt of electricity to your heart either through paddles or wired patches attached to your chest. This is a controlled, usually prescheduled, procedure. This procedure is done at the hospital and you are not awake during the procedure. You usually go home the day of the procedure. Please see the instruction sheet given to you today for more information.  Your physician recommends that you schedule a follow-up appointment in: 3 weeks with Dr Aundra Dubin  At the [facility], you and your health needs are our priority. As part of our continuing mission to provide you with exceptional heart care, we have created designated Provider Care Teams. These Care Teams include your primary Cardiologist (physician) and Advanced Practice Providers (APPs-  Physician Assistants and Nurse Practitioners) who all work together to provide you with the care you need, when you need it.   You may see any of the following providers on your designated Care Team at your next follow up: Marland Kitchen Dr Glori Bickers . Dr Loralie Champagne . Darrick Grinder, NP . Lyda Jester, PA . Audry Riles, PharmD   Please be sure to bring in all your medications bottles to every appointment.

## 2019-08-26 NOTE — Progress Notes (Signed)
PCP: Dr. Nolen Mu Cardiology: Dr. Allyson Sabal HF Cardiology: Dr. Shirlee Latch  58 y.o. with history of nonischemic cardiomyopathy and OSA was referred by Dr. Allyson Sabal for evaluation of CHF.  He was initially diagnosed in early 2/18.  He had URI symptoms then developed dyspnea and put on weight. Echo was done in 2/18, showing EF 20-25%.  LHC in 2/18 showed normal coronaries. He was started on treatment for cardiomyopathy, and EF rose to 40% by 6/18. However, most recent echo in 6/19 showed EF back down to 15-20% with severe RV dilation/dysfunction.  CPX was done in 7/19, showed moderate functional limitation though submaximal.  He had St Jude ICD placed in 8/19.   Patient has had significant left shoulder pain, needs left shoulder replacement (scheduled for tomorrow).  He has been self-medicating for the pain with up to 12 beers/day.  He was noted to be in atrial fibrillation at a pre-op appointment.   Patient returns for followup of CHF.  He remains in atrial fibrillation today, rate is controlled.  He is only taking Coreg once daily, thinks he is taking 25 mg. He is short of breath walking up stairs, no dyspnea walking on flat ground.  Main complaint remains left shoulder pain.  No orthopnea/PND.  No chest pain.  BP has been running low, 84/52 today, but he has only noted occasional mild lightheadedness with standing.   Labs (7/19): K 4.9, creatinine 0.91 Labs (8/19): Na 128, K 4.9, creatinine 0.72 Labs (9/19): LDL 45 Labs (12/19): K 3.9, creatinine 0.94  Labs (1/20): K 4.5, creatinine 0.94, hgb 13.2 Labs (10/20): K 4.4, creatinine 0.99  ECG (personally reviewed): atrial fibrillation rate 72  PMH: 1. OSA: uses CPAP.  2. Depression 3. Chronic systolic CHF: Nonischemic cardiomyopathy.  Diagnosed around 2/18.  - LHC (2/18): Normal coronaries.  - Echo (2/18): EF 20-25% - Echo (6/18): EF 40% - Echo (12/18): EF 35-40% - Echo (6/19): LV mildly dilated, EF 15-20%, RV severely dilated with severely decreased  systolic function.  - CPX (7/19): peak VO2 16, VE/VCO2 slope 37, RER 1.03 => submaximal but probably moderate functional limitation.  - Cardiac MRI (7/19): EF 29% with mild LV dilation, mildly decreased RV systolic function EF 40%, mid-wall LGE throughtout the septal wall and the inferor wall.  - St Jude ICD placed in 8/19.  - Echo (12/19): EF 25-30%, diffuse hypokinesis, normal RV size and systolic function.  - Echo (12/20): EF 25-30%, moderate LVH, normal RV size and systolic function.  4. ETOH abuse.  5. Atrial fibrillation: First noted in 12/20.  6. 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 No drugs. Lives in [place].   FH: Sister with congenital heart abnormality.  No other cardiac disease.   ROS: All systems reviewed and negative except as per HPI.  Current Outpatient Medications  Medication Sig Dispense Refill  . acetaminophen (TYLENOL) 325 MG tablet Take 650 mg by mouth every 6 (six) hours as needed for mild pain.    . carvedilol (COREG) 12.5 MG tablet Take 1 tablet (12.5 mg total) by mouth 2 (two) times daily with a meal. 60 tablet 6  . dapagliflozin propanediol (FARXIGA) 10 MG TABS tablet Take 10 mg by mouth daily before breakfast. 30 tablet 11  . furosemide (LASIX) 40 MG tablet Take 1 tablet (40 mg total) by mouth daily. 30 tablet 5  . KLOR-CON M20 20 MEQ tablet TAKE 1 TABLET BY MOUTH EVERY DAY 30  tablet 5  . LORazepam (ATIVAN) 1 MG tablet Take 0.5-1 mg by mouth every 8 (eight) hours as needed for anxiety.    . Multiple Vitamins-Minerals (MULTIVITAMIN WITH MINERALS) tablet Take 1 tablet by mouth daily.    . rosuvastatin (CRESTOR) 20 MG tablet Take 20 mg by mouth daily.    . sertraline (ZOLOFT) 50 MG tablet TAKE 1 TABLET BY MOUTH EVERY DAY 90 tablet 3  . sildenafil (VIAGRA) 100 MG tablet Take 0.5 tablets (50 mg total) by mouth at bedtime as needed for erectile dysfunction. 10 tablet 6  .  spironolactone (ALDACTONE) 25 MG tablet TAKE 1 TABLET BY MOUTH EVERY DAY 90 tablet 3  . apixaban (ELIQUIS) 5 MG TABS tablet Take 1 tablet (5 mg total) by mouth 2 (two) times daily. 60 tablet 6  . sacubitril-valsartan (ENTRESTO) 49-51 MG Take 1 tablet by mouth 2 (two) times daily. 60 tablet 5   No current facility-administered medications for this encounter.   BP (!) 84/57 (BP Location: Right Arm)   Pulse 88   SpO2 99%  General: NAD Neck: No JVD, no thyromegaly or thyroid nodule.  Lungs: Clear to auscultation bilaterally with normal respiratory effort. CV: Nondisplaced PMI.  Heart regular S1/S2, no S3/S4, no murmur.  No peripheral edema.  No carotid bruit.  Normal pedal pulses.  Abdomen: Soft, nontender, no hepatosplenomegaly, no distention.  Skin: Intact without lesions or rashes.  Neurologic: Alert and oriented x 3.  Psych: Normal affect. Extremities: No clubbing or cyanosis.  HEENT: Normal.   Assessment/Plan: 1. Chronic systolic CHF: Nonischemic cardiomyopathy with biventricular failure.  Cath in 2/18 with no coronary disease.  Echo (6/19) with EF 15-20%, severe RV dilation/dysfunction.  CPX 7/19 submaximal but probably moderate functional limitation.  Cardiac MRI in 7/19 showed EF 29%.  LGE pattern was concerning for prior viral myocarditis. Echo in 12/20 showed stable EF 25-30%.  Now with St Jude ICD.  Cause of CMP is uncertain.  However, he has been drinking more heavily recently than in the past which may worsen his LV function.  No family history of cardiomyopathy.  NYHA class II symptoms, more limited by left shoulder pain. Not volume overloaded on exam. BP has been running low, generally asymptomatically.  - Continue dapagliflozin 10 mg daily.  - Continue spironolactone 25 mg daily.   - He is taking Coreg 25 mg daily, change to 12.5 mg bid.  - Decrease Entresto to 49/51 bid with low BP.   - He does not think that he can do cardiac rehab yet due to pain in his shoulder.  - Continue  Lasix 40 mg daily, BMET today.  - Close followup, may need advanced therapies down the road. - It is imperative that he start to gradually cut back on ETOH intake over the next couple of weeks with goal to quit completely.  2. OSA: Continue CPAP.  3. Anxiety: He is on sertraline.  4. ETOH Abuse: We discussed gradual cessation today.  This is likely contributing to his cardiomyopathy and likely played a role in triggering atrial fibrillation.  5. Atrial fibrillation: Persistent, noted recently at pre-op visit for shoulder surgery and still in afib today.  - Start Eliquis 5 mg bid.  - I will arrange for TEE-guided DCCV next week.  - Cut back on ETOH.  - Continue to use CPAP.  6. Shoulder OA: Needs shoulder replacement but I would like him to wait until after DCCV.  This will be done next week, he could have surgery  after 1 month of uninterrupted anticoagulation post-DCCV.  Also needs to cut back on ETOH prior to surgery.   Loralie Champagne 08/26/2019

## 2019-08-26 NOTE — H&P (View-Only) (Signed)
PCP: Dr. McKinney Cardiology: Dr. Berry HF Cardiology: Dr. Cammie Faulstich  58 y.o. with history of nonischemic cardiomyopathy and OSA was referred by Dr. Berry for evaluation of CHF.  He was initially diagnosed in early 2/18.  He had URI symptoms then developed dyspnea and put on weight. Echo was done in 2/18, showing EF 20-25%.  LHC in 2/18 showed normal coronaries. He was started on treatment for cardiomyopathy, and EF rose to 40% by 6/18. However, most recent echo in 6/19 showed EF back down to 15-20% with severe RV dilation/dysfunction.  CPX was done in 7/19, showed moderate functional limitation though submaximal.  He had St Jude ICD placed in 8/19.   Patient has had significant left shoulder pain, needs left shoulder replacement (scheduled for tomorrow).  He has been self-medicating for the pain with up to 12 beers/day.  He was noted to be in atrial fibrillation at a pre-op appointment.   Patient returns for followup of CHF.  He remains in atrial fibrillation today, rate is controlled.  He is only taking Coreg once daily, thinks he is taking 25 mg. He is short of breath walking up stairs, no dyspnea walking on flat ground.  Main complaint remains left shoulder pain.  No orthopnea/PND.  No chest pain.  BP has been running low, 84/52 today, but he has only noted occasional mild lightheadedness with standing.   Labs (7/19): K 4.9, creatinine 0.91 Labs (8/19): Na 128, K 4.9, creatinine 0.72 Labs (9/19): LDL 45 Labs (12/19): K 3.9, creatinine 0.94  Labs (1/20): K 4.5, creatinine 0.94, hgb 13.2 Labs (10/20): K 4.4, creatinine 0.99  ECG (personally reviewed): atrial fibrillation rate 72  PMH: 1. OSA: uses CPAP.  2. Depression 3. Chronic systolic CHF: Nonischemic cardiomyopathy.  Diagnosed around 2/18.  - LHC (2/18): Normal coronaries.  - Echo (2/18): EF 20-25% - Echo (6/18): EF 40% - Echo (12/18): EF 35-40% - Echo (6/19): LV mildly dilated, EF 15-20%, RV severely dilated with severely decreased  systolic function.  - CPX (7/19): peak VO2 16, VE/VCO2 slope 37, RER 1.03 => submaximal but probably moderate functional limitation.  - Cardiac MRI (7/19): EF 29% with mild LV dilation, mildly decreased RV systolic function EF 40%, mid-wall LGE throughtout the septal wall and the inferor wall.  - St Jude ICD placed in 8/19.  - Echo (12/19): EF 25-30%, diffuse hypokinesis, normal RV size and systolic function.  - Echo (12/20): EF 25-30%, moderate LVH, normal RV size and systolic function.  4. ETOH abuse.  5. Atrial fibrillation: First noted in 12/20.  6. 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 No drugs. Lives in [place].   FH: Sister with congenital heart abnormality.  No other cardiac disease.   ROS: All systems reviewed and negative except as per HPI.  Current Outpatient Medications  Medication Sig Dispense Refill  . acetaminophen (TYLENOL) 325 MG tablet Take 650 mg by mouth every 6 (six) hours as needed for mild pain.    . carvedilol (COREG) 12.5 MG tablet Take 1 tablet (12.5 mg total) by mouth 2 (two) times daily with a meal. 60 tablet 6  . dapagliflozin propanediol (FARXIGA) 10 MG TABS tablet Take 10 mg by mouth daily before breakfast. 30 tablet 11  . furosemide (LASIX) 40 MG tablet Take 1 tablet (40 mg total) by mouth daily. 30 tablet 5  . KLOR-CON M20 20 MEQ tablet TAKE 1 TABLET BY MOUTH EVERY DAY 30   tablet 5  . LORazepam (ATIVAN) 1 MG tablet Take 0.5-1 mg by mouth every 8 (eight) hours as needed for anxiety.    . Multiple Vitamins-Minerals (MULTIVITAMIN WITH MINERALS) tablet Take 1 tablet by mouth daily.    . rosuvastatin (CRESTOR) 20 MG tablet Take 20 mg by mouth daily.    . sertraline (ZOLOFT) 50 MG tablet TAKE 1 TABLET BY MOUTH EVERY DAY 90 tablet 3  . sildenafil (VIAGRA) 100 MG tablet Take 0.5 tablets (50 mg total) by mouth at bedtime as needed for erectile dysfunction. 10 tablet 6  .  spironolactone (ALDACTONE) 25 MG tablet TAKE 1 TABLET BY MOUTH EVERY DAY 90 tablet 3  . apixaban (ELIQUIS) 5 MG TABS tablet Take 1 tablet (5 mg total) by mouth 2 (two) times daily. 60 tablet 6  . sacubitril-valsartan (ENTRESTO) 49-51 MG Take 1 tablet by mouth 2 (two) times daily. 60 tablet 5   No current facility-administered medications for this encounter.   BP (!) 84/57 (BP Location: Right Arm)   Pulse 88   SpO2 99%  General: NAD Neck: No JVD, no thyromegaly or thyroid nodule.  Lungs: Clear to auscultation bilaterally with normal respiratory effort. CV: Nondisplaced PMI.  Heart regular S1/S2, no S3/S4, no murmur.  No peripheral edema.  No carotid bruit.  Normal pedal pulses.  Abdomen: Soft, nontender, no hepatosplenomegaly, no distention.  Skin: Intact without lesions or rashes.  Neurologic: Alert and oriented x 3.  Psych: Normal affect. Extremities: No clubbing or cyanosis.  HEENT: Normal.   Assessment/Plan: 1. Chronic systolic CHF: Nonischemic cardiomyopathy with biventricular failure.  Cath in 2/18 with no coronary disease.  Echo (6/19) with EF 15-20%, severe RV dilation/dysfunction.  CPX 7/19 submaximal but probably moderate functional limitation.  Cardiac MRI in 7/19 showed EF 29%.  LGE pattern was concerning for prior viral myocarditis. Echo in 12/20 showed stable EF 25-30%.  Now with St Jude ICD.  Cause of CMP is uncertain.  However, he has been drinking more heavily recently than in the past which may worsen his LV function.  No family history of cardiomyopathy.  NYHA class II symptoms, more limited by left shoulder pain. Not volume overloaded on exam. BP has been running low, generally asymptomatically.  - Continue dapagliflozin 10 mg daily.  - Continue spironolactone 25 mg daily.   - He is taking Coreg 25 mg daily, change to 12.5 mg bid.  - Decrease Entresto to 49/51 bid with low BP.   - He does not think that he can do cardiac rehab yet due to pain in his shoulder.  - Continue  Lasix 40 mg daily, BMET today.  - Close followup, may need advanced therapies down the road. - It is imperative that he start to gradually cut back on ETOH intake over the next couple of weeks with goal to quit completely.  2. OSA: Continue CPAP.  3. Anxiety: He is on sertraline.  4. ETOH Abuse: We discussed gradual cessation today.  This is likely contributing to his cardiomyopathy and likely played a role in triggering atrial fibrillation.  5. Atrial fibrillation: Persistent, noted recently at pre-op visit for shoulder surgery and still in afib today.  - Start Eliquis 5 mg bid.  - I will arrange for TEE-guided DCCV next week.  - Cut back on ETOH.  - Continue to use CPAP.  6. Shoulder OA: Needs shoulder replacement but I would like him to wait until after DCCV.  This will be done next week, he could have surgery  after 1 month of uninterrupted anticoagulation post-DCCV.  Also needs to cut back on ETOH prior to surgery.   Loralie Champagne 08/26/2019

## 2019-08-27 ENCOUNTER — Ambulatory Visit (HOSPITAL_COMMUNITY): Payer: 59

## 2019-08-27 ENCOUNTER — Other Ambulatory Visit (HOSPITAL_COMMUNITY): Payer: Self-pay

## 2019-08-27 DIAGNOSIS — I4891 Unspecified atrial fibrillation: Secondary | ICD-10-CM

## 2019-08-30 ENCOUNTER — Other Ambulatory Visit (HOSPITAL_COMMUNITY)
Admission: RE | Admit: 2019-08-30 | Discharge: 2019-08-30 | Disposition: A | Discharge status: Home / Self Care | Payer: 59 | Source: Ambulatory Visit | Attending: Cardiology | Admitting: Cardiology

## 2019-08-30 ENCOUNTER — Other Ambulatory Visit (HOSPITAL_COMMUNITY): Payer: 59

## 2019-08-30 ENCOUNTER — Ambulatory Visit (HOSPITAL_COMMUNITY): Payer: 59

## 2019-08-30 DIAGNOSIS — Z01812 Encounter for preprocedural laboratory examination: Secondary | ICD-10-CM | POA: Diagnosis present

## 2019-08-30 DIAGNOSIS — Z20828 Contact with and (suspected) exposure to other viral communicable diseases: Secondary | ICD-10-CM | POA: Insufficient documentation

## 2019-08-30 LAB — SARS CORONAVIRUS 2 (TAT 6-24 HRS): SARS Coronavirus 2: NEGATIVE

## 2019-09-01 ENCOUNTER — Ambulatory Visit (HOSPITAL_COMMUNITY): Payer: 59 | Admitting: Certified Registered"

## 2019-09-01 ENCOUNTER — Other Ambulatory Visit: Payer: Self-pay

## 2019-09-01 ENCOUNTER — Encounter (HOSPITAL_COMMUNITY)
Admission: RE | Disposition: A | Discharge status: Home / Self Care | Payer: Self-pay | Source: Home / Self Care | Attending: Cardiology

## 2019-09-01 ENCOUNTER — Ambulatory Visit (HOSPITAL_BASED_OUTPATIENT_CLINIC_OR_DEPARTMENT_OTHER)
Admission: RE | Admit: 2019-09-01 | Discharge: 2019-09-01 | Disposition: A | Discharge status: Home / Self Care | Payer: 59 | Source: Ambulatory Visit | Attending: Cardiology | Admitting: Cardiology

## 2019-09-01 ENCOUNTER — Ambulatory Visit (HOSPITAL_COMMUNITY): Payer: 59

## 2019-09-01 ENCOUNTER — Ambulatory Visit (HOSPITAL_COMMUNITY)
Admission: RE | Admit: 2019-09-01 | Discharge: 2019-09-01 | Disposition: A | Discharge status: Home / Self Care | Payer: 59 | Attending: Cardiology | Admitting: Cardiology

## 2019-09-01 ENCOUNTER — Encounter (HOSPITAL_COMMUNITY): Payer: Self-pay | Admitting: Cardiology

## 2019-09-01 DIAGNOSIS — Z79899 Other long term (current) drug therapy: Secondary | ICD-10-CM | POA: Insufficient documentation

## 2019-09-01 DIAGNOSIS — F419 Anxiety disorder, unspecified: Secondary | ICD-10-CM | POA: Insufficient documentation

## 2019-09-01 DIAGNOSIS — Z95810 Presence of automatic (implantable) cardiac defibrillator: Secondary | ICD-10-CM | POA: Insufficient documentation

## 2019-09-01 DIAGNOSIS — I4891 Unspecified atrial fibrillation: Secondary | ICD-10-CM | POA: Insufficient documentation

## 2019-09-01 DIAGNOSIS — M19012 Primary osteoarthritis, left shoulder: Secondary | ICD-10-CM | POA: Insufficient documentation

## 2019-09-01 DIAGNOSIS — I428 Other cardiomyopathies: Secondary | ICD-10-CM | POA: Diagnosis not present

## 2019-09-01 DIAGNOSIS — G4733 Obstructive sleep apnea (adult) (pediatric): Secondary | ICD-10-CM | POA: Insufficient documentation

## 2019-09-01 DIAGNOSIS — I5082 Biventricular heart failure: Secondary | ICD-10-CM | POA: Insufficient documentation

## 2019-09-01 DIAGNOSIS — Z8249 Family history of ischemic heart disease and other diseases of the circulatory system: Secondary | ICD-10-CM | POA: Diagnosis not present

## 2019-09-01 DIAGNOSIS — Z7901 Long term (current) use of anticoagulants: Secondary | ICD-10-CM | POA: Diagnosis not present

## 2019-09-01 DIAGNOSIS — I5022 Chronic systolic (congestive) heart failure: Secondary | ICD-10-CM | POA: Diagnosis not present

## 2019-09-01 DIAGNOSIS — Z7984 Long term (current) use of oral hypoglycemic drugs: Secondary | ICD-10-CM | POA: Insufficient documentation

## 2019-09-01 DIAGNOSIS — I420 Dilated cardiomyopathy: Secondary | ICD-10-CM | POA: Insufficient documentation

## 2019-09-01 HISTORY — PX: TEE WITHOUT CARDIOVERSION: SHX5443

## 2019-09-01 HISTORY — PX: CARDIOVERSION: SHX1299

## 2019-09-01 LAB — POCT I-STAT, CHEM 8
BUN: 17 mg/dL (ref 6–20)
Calcium, Ion: 1.13 mmol/L — ABNORMAL LOW (ref 1.15–1.40)
Chloride: 106 mmol/L (ref 98–111)
Creatinine, Ser: 1 mg/dL (ref 0.61–1.24)
Glucose, Bld: 113 mg/dL — ABNORMAL HIGH (ref 70–99)
HCT: 39 % (ref 39.0–52.0)
Hemoglobin: 13.3 g/dL (ref 13.0–17.0)
Potassium: 4.6 mmol/L (ref 3.5–5.1)
Sodium: 138 mmol/L (ref 135–145)
TCO2: 24 mmol/L (ref 22–32)

## 2019-09-01 SURGERY — ECHOCARDIOGRAM, TRANSESOPHAGEAL
Anesthesia: General

## 2019-09-01 MED ORDER — PROPOFOL 500 MG/50ML IV EMUL
INTRAVENOUS | Status: DC | PRN
  Administered 2019-09-01: 100 ug/kg/min via INTRAVENOUS

## 2019-09-01 MED ORDER — SODIUM CHLORIDE 0.9 % IV SOLN: INTRAVENOUS | Status: DC

## 2019-09-01 MED ORDER — PHENYLEPHRINE HCL (PRESSORS) 10 MG/ML IV SOLN
INTRAVENOUS | Status: DC | PRN
  Administered 2019-09-01: 80 ug via INTRAVENOUS

## 2019-09-01 MED ORDER — MIDAZOLAM HCL (PF) 5 MG/ML IJ SOLN
INTRAMUSCULAR | Status: AC
  Filled 2019-09-01: qty 1

## 2019-09-01 NOTE — Procedures (Addendum)
Electrical Cardioversion Procedure Note Lieutenant Abarca [id number] Mar 20, 1961  Procedure: Electrical Cardioversion Indications:  Atrial Fibrillation  Procedure Details Consent: Risks of procedure as well as the alternatives and risks of each were explained to the (patient/caregiver).  Consent for procedure obtained. Time Out: Verified patient identification, verified procedure, site/side was marked, verified correct patient position, special equipment/implants available, medications/allergies/relevent history reviewed, required imaging and test results available.  Performed  Patient placed on cardiac monitor, pulse oximetry, supplemental oxygen as necessary.  Sedation given: Propofol per anesthesiology Pacer pads placed anterior and posterior chest.  Cardioverted 1 time(s).  Cardioverted at [place].  Evaluation Findings: Post procedure EKG shows: NSR Complications: None Patient did tolerate procedure well.  Patient needs to avoid ETOH to stay out of atrial fibrillation.    Loralie Champagne 09/01/2019, 10:04 AM

## 2019-09-01 NOTE — Progress Notes (Signed)
  Echocardiogram Echocardiogram Transesophageal has been performed.  John [name] 09/01/2019, 10:13 AM

## 2019-09-01 NOTE — CV Procedure (Signed)
Procedure: TEE  Sedation: Propofol per anesthesiology  Indication: Atrial fibrillation.  Findings: Please see echo section for full report.  Mildly dilated LV with normal wall thickness.  EF 25-30%, diffuse hypokinesis.  Normal RV size with mildly decreased systolic function.  Moderate left atrial enlargement, normal right atrium.  No LA appendage thrombus.  No PFO or ASD by color doppler.  Trivial mitral regurgitation.  Trileaflet aortic valve with no stenosis or regurgitation.  Trivial TR.  Normal caliber thoracic aorta with mild plaque in the descending thoracic aorta.   Impression: OK for DCCV.   John [name] 09/01/2019 10:03 AM

## 2019-09-01 NOTE — Transfer of Care (Signed)
Immediate Anesthesia Transfer of Care Note  Patient: John [name]  Procedure(s) Performed: TRANSESOPHAGEAL ECHOCARDIOGRAM (TEE) (N/A ) CARDIOVERSION (N/A )  Patient Location: Endoscopy Unit  Anesthesia Type:General  Level of Consciousness: awake, alert  and oriented  Airway & Oxygen Therapy: Patient Spontanous Breathing and Patient connected to nasal cannula oxygen  Post-op Assessment: Report given to RN and Post -op Vital signs reviewed and stable  Post vital signs: Reviewed and stable  Last Vitals:  Vitals Value Taken Time  BP    Temp    Pulse    Resp    SpO2      Last Pain:  Vitals:   09/01/19 0921  PainSc: 0-No pain         Complications: No apparent anesthesia complications

## 2019-09-01 NOTE — Interval H&P Note (Signed)
History and Physical Interval Note:  09/01/2019 9:51 AM  John [name]  has presented today for surgery, with the diagnosis of atrial fibrillation.  The various methods of treatment have been discussed with the patient and family. After consideration of risks, benefits and other options for treatment, the patient has consented to  Procedure(s): TRANSESOPHAGEAL ECHOCARDIOGRAM (TEE) (N/A) CARDIOVERSION (N/A) as a surgical intervention.  The patient's history has been reviewed, patient examined, no change in status, stable for surgery.  I have reviewed the patient's chart and labs.  Questions were answered to the patient's satisfaction.     Yama Nielson [employer]

## 2019-09-01 NOTE — Discharge Instructions (Signed)

## 2019-09-01 NOTE — Anesthesia Preprocedure Evaluation (Addendum)
Anesthesia Evaluation  Patient identified by MRN, date of birth, ID band Patient awake    Reviewed: Allergy & Precautions, NPO status , Patient's Chart, lab work & pertinent test results  Airway Mallampati: II  TM Distance: >3 FB     Dental   Pulmonary neg pulmonary ROS,    breath sounds clear to auscultation       Cardiovascular hypertension, +CHF  + dysrhythmias Atrial Fibrillation  Rhythm:Irregular Rate:Normal     Neuro/Psych negative neurological ROS  negative psych ROS   GI/Hepatic negative GI ROS, Neg liver ROS,   Endo/Other  negative endocrine ROS  Renal/GU Renal disease  negative genitourinary   Musculoskeletal  (+) Arthritis ,   Abdominal   Peds  Hematology negative hematology ROS (+) anemia ,   Anesthesia Other Findings   Reproductive/Obstetrics                            Anesthesia Physical Anesthesia Plan  ASA: III  Anesthesia Plan: General   Post-op Pain Management:    Induction: Intravenous  PONV Risk Score and Plan: 2 and Propofol infusion  Airway Management Planned: Nasal Cannula and Simple Face Mask  Additional Equipment:   Intra-op Plan:   Post-operative Plan:   Informed Consent: I have reviewed the patients History and Physical, chart, labs and discussed the procedure including the risks, benefits and alternatives for the proposed anesthesia with the patient or authorized representative who has indicated his/her understanding and acceptance.     Dental advisory given  Plan Discussed with: CRNA and Anesthesiologist  Anesthesia Plan Comments:         Anesthesia Quick Evaluation

## 2019-09-03 ENCOUNTER — Ambulatory Visit (HOSPITAL_COMMUNITY): Payer: 59

## 2019-09-03 NOTE — Progress Notes (Signed)
Remote ICD transmission.   

## 2019-09-03 NOTE — Anesthesia Postprocedure Evaluation (Signed)
Anesthesia Post Note  Patient: John [name]  Procedure(s) Performed: TRANSESOPHAGEAL ECHOCARDIOGRAM (TEE) (N/A ) CARDIOVERSION (N/A )     Patient location during evaluation: PACU Anesthesia Type: General Level of consciousness: awake Pain management: pain level controlled Vital Signs Assessment: post-procedure vital signs reviewed and stable Respiratory status: spontaneous breathing Cardiovascular status: stable Postop Assessment: no apparent nausea or vomiting Anesthetic complications: no    Last Vitals:  Vitals:   09/01/19 1015 09/01/19 1024  BP: (!) 141/102 (!) 94/57  Pulse: 64 64  Resp: 17 20  Temp: 36.7 C   SpO2: 99% 100%    Last Pain:  Vitals:   09/01/19 1024  TempSrc:   PainSc: 0-No pain                 Robbi Scurlock

## 2019-09-06 ENCOUNTER — Ambulatory Visit (HOSPITAL_COMMUNITY): Payer: 59

## 2019-09-07 ENCOUNTER — Other Ambulatory Visit (HOSPITAL_COMMUNITY): Payer: Self-pay | Admitting: Cardiology

## 2019-09-08 ENCOUNTER — Ambulatory Visit (HOSPITAL_COMMUNITY): Payer: 59

## 2019-09-12 ENCOUNTER — Other Ambulatory Visit (HOSPITAL_COMMUNITY): Payer: Self-pay | Admitting: Cardiology

## 2019-09-13 ENCOUNTER — Ambulatory Visit (HOSPITAL_COMMUNITY): Payer: 59

## 2019-09-14 ENCOUNTER — Ambulatory Visit (HOSPITAL_COMMUNITY)
Admission: RE | Admit: 2019-09-14 | Discharge: 2019-09-14 | Disposition: A | Discharge status: Home / Self Care | Payer: 59 | Source: Ambulatory Visit | Attending: Cardiology | Admitting: Cardiology

## 2019-09-14 ENCOUNTER — Encounter (HOSPITAL_COMMUNITY): Payer: Self-pay | Admitting: Cardiology

## 2019-09-14 ENCOUNTER — Other Ambulatory Visit: Payer: Self-pay

## 2019-09-14 VITALS — BP 109/66 | HR 82 | Wt 256.6 lb

## 2019-09-14 DIAGNOSIS — Z7901 Long term (current) use of anticoagulants: Secondary | ICD-10-CM | POA: Diagnosis not present

## 2019-09-14 DIAGNOSIS — R739 Hyperglycemia, unspecified: Secondary | ICD-10-CM | POA: Diagnosis not present

## 2019-09-14 DIAGNOSIS — F329 Major depressive disorder, single episode, unspecified: Secondary | ICD-10-CM | POA: Diagnosis not present

## 2019-09-14 DIAGNOSIS — Z7984 Long term (current) use of oral hypoglycemic drugs: Secondary | ICD-10-CM | POA: Diagnosis not present

## 2019-09-14 DIAGNOSIS — I498 Other specified cardiac arrhythmias: Secondary | ICD-10-CM | POA: Diagnosis not present

## 2019-09-14 DIAGNOSIS — F419 Anxiety disorder, unspecified: Secondary | ICD-10-CM | POA: Insufficient documentation

## 2019-09-14 DIAGNOSIS — Z95810 Presence of automatic (implantable) cardiac defibrillator: Secondary | ICD-10-CM | POA: Insufficient documentation

## 2019-09-14 DIAGNOSIS — G4733 Obstructive sleep apnea (adult) (pediatric): Secondary | ICD-10-CM | POA: Diagnosis not present

## 2019-09-14 DIAGNOSIS — F1010 Alcohol abuse, uncomplicated: Secondary | ICD-10-CM | POA: Diagnosis not present

## 2019-09-14 DIAGNOSIS — I5082 Biventricular heart failure: Secondary | ICD-10-CM | POA: Diagnosis not present

## 2019-09-14 DIAGNOSIS — I4891 Unspecified atrial fibrillation: Secondary | ICD-10-CM | POA: Diagnosis not present

## 2019-09-14 DIAGNOSIS — I428 Other cardiomyopathies: Secondary | ICD-10-CM | POA: Diagnosis not present

## 2019-09-14 DIAGNOSIS — M19012 Primary osteoarthritis, left shoulder: Secondary | ICD-10-CM | POA: Insufficient documentation

## 2019-09-14 DIAGNOSIS — I480 Paroxysmal atrial fibrillation: Secondary | ICD-10-CM | POA: Diagnosis not present

## 2019-09-14 DIAGNOSIS — I5022 Chronic systolic (congestive) heart failure: Secondary | ICD-10-CM | POA: Diagnosis not present

## 2019-09-14 DIAGNOSIS — Z79899 Other long term (current) drug therapy: Secondary | ICD-10-CM | POA: Insufficient documentation

## 2019-09-14 LAB — BASIC METABOLIC PANEL
Anion gap: 10 (ref 5–15)
BUN: 16 mg/dL (ref 6–20)
CO2: 25 mmol/L (ref 22–32)
Calcium: 9.9 mg/dL (ref 8.9–10.3)
Chloride: 103 mmol/L (ref 98–111)
Creatinine, Ser: 1.17 mg/dL (ref 0.61–1.24)
GFR calc Af Amer: >60 mL/min (ref >60)
GFR calc non Af Amer: >60 mL/min (ref >60)
Glucose, Bld: 120 mg/dL — ABNORMAL HIGH (ref 70–99)
Potassium: 4.6 mmol/L (ref 3.5–5.1)
Sodium: 138 mmol/L (ref 135–145)

## 2019-09-14 LAB — HEMOGLOBIN A1C
Hgb A1c MFr Bld: 5.5 % (ref 4.8–5.6)
Mean Plasma Glucose: 111.15 mg/dL

## 2019-09-14 MED ORDER — SILDENAFIL CITRATE 100 MG PO TABS: 50.0000 mg | ORAL_TABLET | Freq: Every evening | ORAL | 3 refills | Status: DC | PRN

## 2019-09-14 NOTE — Progress Notes (Signed)
PCP: Dr. Caprice Beaver Cardiology: Dr. Gwenlyn Found HF Cardiology: Dr. Aundra Dubin  58 y.o. with history of nonischemic cardiomyopathy and OSA was referred by Dr. Gwenlyn Found for evaluation of CHF.  He was initially diagnosed in early 2/18.  He had URI symptoms then developed dyspnea and put on weight. Echo was done in 2/18, showing EF 20-25%.  LHC in 2/18 showed normal coronaries. He was started on treatment for cardiomyopathy, and EF rose to 40% by 6/18. However, most recent echo in 6/19 showed EF back down to 15-20% with severe RV dilation/dysfunction.  CPX was done in 7/19, showed moderate functional limitation though submaximal.  He had St Jude ICD placed in 8/19.   Patient has had significant left shoulder pain, needs left shoulder replacement, postponed with persistent atrial fibrillation.  He had been self-medicating for the pain with up to 12 beers/day.  Atrial fibrillation noted in 12/20, he had TEE-guided DCCV in 12/20.  He remains in NSR today and has not felt palpitations.  He has cut back significantly on ETOH intake and is down to just a few beers/week.   Patient returns for followup of CHF.  He is in NSR.  BP improved and no lightheadedness after cutting back on Entresto dose.  Occasional cough, not productive.  No significant exertional dyspnea.  No orthopnea/PND.  No palpitations.   St Jude device interrogated: No atrial fibrillation since DCCV, thoracic impedance not suggestive of volume overload.   Labs (7/19): K 4.9, creatinine 0.91 Labs (8/19): Na 128, K 4.9, creatinine 0.72 Labs (9/19): LDL 45 Labs (12/19): K 3.9, creatinine 0.94  Labs (1/20): K 4.5, creatinine 0.94, hgb 13.2 Labs (10/20): K 4.4, creatinine 0.99 Labs (12/20): K 4.6, creatinine 1.0  PMH: 1. OSA: uses CPAP.  2. Depression 3. Chronic systolic CHF: Nonischemic cardiomyopathy.  Diagnosed around 2/18.  - LHC (2/18): Normal coronaries.  - Echo (2/18): EF 20-25% - Echo (6/18): EF 40% - Echo (12/18): EF 35-40% - Echo (6/19): LV  mildly dilated, EF 15-20%, RV severely dilated with severely decreased systolic function.  - CPX (7/19): peak VO2 16, VE/VCO2 slope 37, RER 1.03 => submaximal but probably moderate functional limitation.  - Cardiac MRI (7/19): EF 29% with mild LV dilation, mildly decreased RV systolic function EF 22%, mid-wall LGE throughtout the septal wall and the inferor wall.  - St Jude ICD placed in 8/19.  - Echo (12/19): EF 25-30%, diffuse hypokinesis, normal RV size and systolic function.  - Echo (12/20): EF 25-30%, moderate LVH, normal RV size and systolic function.  - TEE (12/20): EF 25-30%, mildly decreased RV systolic function.  4. ETOH abuse.  5. Atrial fibrillation: First noted in 12/20.  - DCCV to NSR in 12/20.  6. Left shoulder OA  SH: Originally from [place], divorced with 3 kids, works in sales/logistics, never smoked, h/o ETOH abuse. No drugs. Lives in [place].   FH: Sister with congenital heart abnormality.  No other cardiac disease.   ROS: All systems reviewed and negative except as per HPI.  Current Outpatient Medications  Medication Sig Dispense Refill  . acetaminophen (TYLENOL) 325 MG tablet Take 650 mg by mouth every 6 (six) hours as needed for mild pain.    Marland Kitchen apixaban (ELIQUIS) 5 MG TABS tablet Take 1 tablet (5 mg total) by mouth 2 (two) times daily. 60 tablet 6  . carvedilol (COREG) 12.5 MG tablet Take 1 tablet (12.5 mg total) by mouth 2 (two) times daily with a meal. 60 tablet 6  . dapagliflozin propanediol (FARXIGA)  10 MG TABS tablet Take 10 mg by mouth daily before breakfast. 30 tablet 11  . furosemide (LASIX) 40 MG tablet Take 1 tablet (40 mg total) by mouth daily. 30 tablet 5  . KLOR-CON M20 20 MEQ tablet TAKE 1 TABLET BY MOUTH EVERY DAY 30 tablet 5  . LORazepam (ATIVAN) 1 MG tablet Take 0.5-1 mg by mouth every 8 (eight) hours as needed for anxiety.    . Multiple Vitamins-Minerals (MULTIVITAMIN WITH MINERALS) tablet Take 1 tablet by mouth daily.    . rosuvastatin (CRESTOR) 20  MG tablet Take 20 mg by mouth daily.    . sacubitril-valsartan (ENTRESTO) 49-51 MG Take 1 tablet by mouth 2 (two) times daily. 60 tablet 5  . sertraline (ZOLOFT) 50 MG tablet TAKE 1 TABLET BY MOUTH EVERY DAY 90 tablet 4  . sildenafil (VIAGRA) 100 MG tablet Take 0.5 tablets (50 mg total) by mouth at bedtime as needed for erectile dysfunction. 10 tablet 3  . spironolactone (ALDACTONE) 25 MG tablet TAKE 1 TABLET BY MOUTH EVERY DAY 90 tablet 3  . traMADol (ULTRAM) 50 MG tablet Take 50 mg by mouth every 6 (six) hours as needed for moderate pain or severe pain.     No current facility-administered medications for this encounter.   BP 109/66   Pulse 82   Wt 116.4 kg (256 lb 9.6 oz)   SpO2 99%   BMI 35.79 kg/m  General: NAD Neck: No JVD, no thyromegaly or thyroid nodule.  Lungs: Clear to auscultation bilaterally with normal respiratory effort. CV: Nondisplaced PMI.  Heart regular S1/S2, no S3/S4, no murmur.  No peripheral edema.  No carotid bruit.  Normal pedal pulses.  Abdomen: Soft, nontender, no hepatosplenomegaly, no distention.  Skin: Intact without lesions or rashes.  Neurologic: Alert and oriented x 3.  Psych: Normal affect. Extremities: No clubbing or cyanosis.  HEENT: Normal.   Assessment/Plan: 1. Chronic systolic CHF: Nonischemic cardiomyopathy with biventricular failure.  Cath in 2/18 with no coronary disease.  Echo (6/19) with EF 15-20%, severe RV dilation/dysfunction.  CPX 7/19 submaximal but probably moderate functional limitation.  Cardiac MRI in 7/19 showed EF 29%.  LGE pattern was concerning for prior viral myocarditis. Echo in 12/20 showed stable EF 25-30%.  Now with St Jude ICD.  Cause of CMP is uncertain.  However, he had been drinking heavily recently which may have worsened his LV function.  No family history of cardiomyopathy.  NYHA class II symptoms, more limited by left shoulder pain. Not volume overloaded on exam. BP better on lower Entresto dose and no  lightheadedness. - Continue dapagliflozin 10 mg daily.  - Continue spironolactone 25 mg daily.   - Continue Coreg 12.5 mg bid.  - Continue Entresto 49/51 bid.    - Continue Lasix 40 mg daily, BMET today.  - Close followup, may need advanced therapies down the road. - He has cut back considerably on ETOH, want him to quit altogether.   2. OSA: Continue CPAP.  3. Anxiety: He is on sertraline.  4. ETOH Abuse: This is likely contributing to his cardiomyopathy and likely played a role in triggering atrial fibrillation.  He has cut back considerably.  I asked him to continue to work on stopping completely.  He is concerned about going into a rehab facility because of his work.  5. Atrial fibrillation: Paroxysmal, remains in NSR after DCCV in 12/20.  - Continue Eliquis 5 mg bid.   - Cut back on ETOH.  - Continue to use CPAP.  6. Shoulder OA: Needs shoulder replacement.  He could have surgery after 1 month of uninterrupted anticoagulation post-DCCV.  Also needs to cut back on ETOH prior to surgery.   Followup in 2 months.   Marca Ancona 09/14/2019

## 2019-09-14 NOTE — Patient Instructions (Signed)
A refill for sildenafil was sent to your pharmacy  Labs today We will only contact you if something comes back abnormal or we need to make some changes. Otherwise no news is good news!  Your physician recommends that you schedule a follow-up appointment in: 2 months with Dr Aundra Dubin  At the [facility], you and your health needs are our priority. As part of our continuing mission to provide you with exceptional heart care, we have created designated Provider Care Teams. These Care Teams include your primary Cardiologist (physician) and Advanced Practice Providers (APPs- Physician Assistants and Nurse Practitioners) who all work together to provide you with the care you need, when you need it.   You may see any of the following providers on your designated Care Team at your next follow up: Marland Kitchen Dr Glori Bickers . Dr Loralie Champagne . Darrick Grinder, NP . Lyda Jester, PA . Audry Riles, PharmD   Please be sure to bring in all your medications bottles to every appointment.

## 2019-09-15 ENCOUNTER — Ambulatory Visit (HOSPITAL_COMMUNITY): Payer: 59

## 2019-09-27 ENCOUNTER — Other Ambulatory Visit (HOSPITAL_COMMUNITY): Payer: Self-pay | Admitting: Cardiology

## 2019-09-27 ENCOUNTER — Other Ambulatory Visit: Payer: Self-pay | Admitting: Orthopedic Surgery

## 2019-10-05 ENCOUNTER — Encounter (HOSPITAL_COMMUNITY): Payer: 59 | Admitting: Cardiology

## 2019-10-06 ENCOUNTER — Other Ambulatory Visit: Payer: Self-pay

## 2019-10-08 ENCOUNTER — Telehealth (HOSPITAL_COMMUNITY): Payer: Self-pay | Admitting: Pharmacist

## 2019-10-08 ENCOUNTER — Other Ambulatory Visit: Payer: Self-pay

## 2019-10-08 ENCOUNTER — Telehealth (HOSPITAL_COMMUNITY): Payer: Self-pay

## 2019-10-08 MED ORDER — POTASSIUM CHLORIDE CRYS ER 20 MEQ PO TBCR: 20.0000 meq | EXTENDED_RELEASE_TABLET | Freq: Every day | ORAL | 0 refills | Status: DC

## 2019-10-08 NOTE — Telephone Encounter (Signed)
Advanced Heart Failure Patient Advocate Encounter  Prior Authorization for Marcelline Deist has been approved.    Effective dates: 10/08/2019 through 10/07/2020  Karle Plumber, PharmD, BCPS, BCCP, CPP Heart Failure Clinic Pharmacist [phone]

## 2019-10-08 NOTE — Telephone Encounter (Signed)
Clearance faxed back to [facility].

## 2019-10-11 ENCOUNTER — Telehealth (HOSPITAL_COMMUNITY): Payer: Self-pay | Admitting: *Deleted

## 2019-10-11 ENCOUNTER — Other Ambulatory Visit: Payer: Self-pay | Admitting: Orthopedic Surgery

## 2019-10-11 NOTE — Progress Notes (Signed)
Chart reviewed with Dr Richardson Landry. Patient scheduled for total shoulder arthroplasy at [facility], has hx severe non-ischemic cardiomyopathy, OSA, afib, ETOH abuse. Pt had ICD placed 2019 for EF 15-20%. Recent TEE 08-2019 for cardioversion showed EF 25-30%. Patient will be better served being done at Main OR with more advanced monitoring. Judeth Cornfield at Dr Veda Canning office notified.

## 2019-10-11 NOTE — Telephone Encounter (Signed)
Called [facility] confirmed receipt of clearance after multiple failed attempts.

## 2019-10-11 NOTE — Telephone Encounter (Signed)
Surgical clearance faxed again to [phone]

## 2019-10-12 ENCOUNTER — Encounter (HOSPITAL_COMMUNITY)
Admission: RE | Admit: 2019-10-12 | Discharge: 2019-10-12 | Disposition: A | Discharge status: Home / Self Care | Payer: 59 | Source: Ambulatory Visit | Attending: Orthopedic Surgery | Admitting: Orthopedic Surgery

## 2019-10-12 ENCOUNTER — Encounter (HOSPITAL_COMMUNITY): Payer: Self-pay

## 2019-10-12 ENCOUNTER — Other Ambulatory Visit: Payer: Self-pay

## 2019-10-12 DIAGNOSIS — Z01818 Encounter for other preprocedural examination: Secondary | ICD-10-CM | POA: Insufficient documentation

## 2019-10-12 DIAGNOSIS — M19112 Post-traumatic osteoarthritis, left shoulder: Secondary | ICD-10-CM | POA: Insufficient documentation

## 2019-10-12 HISTORY — DX: Cardiac arrhythmia, unspecified: I49.9

## 2019-10-12 NOTE — Progress Notes (Addendum)
PCP - John [name]. LOV; 09/14/19 Cardiologist -   Chest x-ray -  EKG - 08/24/19. CARE EVERYWHERE Stress Test -  ECHO - 09/01/19. EPIC Cardiac Cath -  A1-C: 09/14/19 EPIC Sleep Study: YES -  CPAP - YES  Fasting Blood Sugar -  Checks Blood Sugar _____ times a day  Blood Thinner Instructions:ELIQUIS WIL BE STOP ON 10/15/19 Aspirin Instructions: Last Dose:  Anesthesia review:   Patient denies shortness of breath, fever, cough and chest pain at PAT appointment   Patient verbalized understanding of instructions that were given to them at the PAT appointment. Patient was also instructed that they will need to review over the PAT instructions again at home before surgery.

## 2019-10-12 NOTE — Patient Instructions (Addendum)
DUE TO COVID-19 ONLY ONE VISITOR IS ALLOWED TO COME WITH YOU AND STAY IN THE WAITING ROOM ONLY DURING PRE OP AND PROCEDURE DAY OF SURGERY. THE 1 VISITOR MAY VISIT WITH YOU AFTER SURGERY IN YOUR PRIVATE ROOM DURING VISITING HOURS ONLY!  YOU NEED TO HAVE A COVID 19 TEST ON: 10/15/19 AT 10:30 AM, THIS TEST MUST BE DONE BEFORE SURGERY, COME  [address], [place] , [address].  [facility]) ONCE YOUR COVID TEST IS COMPLETED, PLEASE BEGIN THE QUARANTINE INSTRUCTIONS AS OUTLINED IN YOUR HANDOUT.                Kemal Amores    Your procedure is scheduled on: 10/19/19   Report to [facility] Main  Entrance   Report to: SHORT STAY at: 5:30 AM     Call this number if you have problems the morning of surgery [phone]    Remember:    BRUSH YOUR TEETH MORNING OF SURGERY AND RINSE YOUR MOUTH OUT, NO CHEWING GUM CANDY OR MINTS.     Take these medicines the morning of surgery with A SIP OF WATER: CARVEDILOL,CRESTOR,SERTALINE,LORAZEPAM,SACUBITRIL   DO NOT TAKE ANY DIABETIC MEDICATIONS DAY OF YOUR SURGERY: DO NOT TAKE DAPAGLIFLOZIN (FARXIGA) THE DAY BEFORE OR THE DAY OF SURGERY.                                 You may not have any metal on your body including hair pins and              piercings  Do not wear jewelry,lotions, powders or perfumes, deodorant             Men may shave face and neck.   Do not bring valuables to the hospital. [facility] IS NOT             RESPONSIBLE   FOR VALUABLES.  Contacts, dentures or bridgework may not be worn into surgery.  Leave suitcase in the car. After surgery it may be brought to your room.     Patients discharged the day of surgery will not be allowed to drive home. IF YOU ARE HAVING SURGERY AND GOING HOME THE SAME DAY, YOU MUST HAVE AN ADULT TO DRIVE YOU HOME AND BE WITH YOU FOR 24 HOURS. YOU MAY GO HOME BY TAXI OR UBER OR ORTHERWISE, BUT AN ADULT MUST ACCOMPANY YOU HOME AND STAY WITH YOU FOR 24 HOURS.  Name and phone  number of your driver:  Special Instructions: N/A              Please read over the following fact sheets you were given: _____________________________________________________________________             NO SOLID FOOD AFTER MIDNIGHT THE NIGHT PRIOR TO SURGERY. NOTHING BY MOUTH EXCEPT CLEAR LIQUIDS UNTIL: 4:30 AM . PLEASE FINISH ENSURE DRINK PER SURGEON ORDER  WHICH NEEDS TO BE COMPLETED AT: 4:30 AM.   CLEAR LIQUID DIET   Foods Allowed                                                                     Foods Excluded  Coffee and tea,  regular and decaf                             liquids that you cannot  Plain Jell-O any favor except red or purple                                           see through such as: Fruit ices (not with fruit pulp)                                     milk, soups, orange juice  Iced Popsicles                                    All solid food Carbonated beverages, regular and diet                                    Cranberry, grape and apple juices Sports drinks like Gatorade Lightly seasoned clear broth or consume(fat free) Sugar, honey syrup  Sample Menu Breakfast                                Lunch                                     Supper Cranberry juice                    Beef broth                            Chicken broth Jell-O                                     Grape juice                           Apple juice Coffee or tea                        Jell-O                                      Popsicle                                                Coffee or tea                        Coffee or tea  _____________________________________________________________________  [facility] - Preparing for Surgery Before surgery, you can play an important role.  Because skin is not sterile, your skin needs to be as free of  germs as possible.  You can reduce the number of germs on your skin by washing with CHG (chlorahexidine gluconate) soap before surgery.   CHG is an antiseptic cleaner which kills germs and bonds with the skin to continue killing germs even after washing. Please DO NOT use if you have an allergy to CHG or antibacterial soaps.  If your skin becomes reddened/irritated stop using the CHG and inform your nurse when you arrive at Short Stay. Do not shave (including legs and underarms) for at least 48 hours prior to the first CHG shower.  You may shave your face/neck. Please follow these instructions carefully:  1.  Shower with CHG Soap the night before surgery and the  morning of Surgery.  2.  If you choose to wash your hair, wash your hair first as usual with your  normal  shampoo.  3.  After you shampoo, rinse your hair and body thoroughly to remove the  shampoo.                           4.  Use CHG as you would any other liquid soap.  You can apply chg directly  to the skin and wash                       Gently with a scrungie or clean washcloth.  5.  Apply the CHG Soap to your body ONLY FROM THE NECK DOWN.   Do not use on face/ open                           Wound or open sores. Avoid contact with eyes, ears mouth and genitals (private parts).                       Wash face,  Genitals (private parts) with your normal soap.             6.  Wash thoroughly, paying special attention to the area where your surgery  will be performed.  7.  Thoroughly rinse your body with warm water from the neck down.  8.  DO NOT shower/wash with your normal soap after using and rinsing off  the CHG Soap.                9.  Pat yourself dry with a clean towel.            10.  Wear clean pajamas.            11.  Place clean sheets on your bed the night of your first shower and do not  sleep with pets. Day of Surgery : Do not apply any lotions/deodorants the morning of surgery.  Please wear clean clothes to the hospital/surgery center.  FAILURE TO FOLLOW THESE INSTRUCTIONS MAY RESULT IN THE CANCELLATION OF YOUR SURGERY PATIENT  SIGNATURE_________________________________  NURSE SIGNATURE__________________________________  ________________________________________________________________________   Adam Phenix  An incentive spirometer is a tool that can help keep your lungs clear and active. This tool measures how well you are filling your lungs with each breath. Taking long deep breaths may help reverse or decrease the chance of developing breathing (pulmonary) problems (especially infection) following:  A long period of time when you are unable to move or be active. BEFORE THE PROCEDURE   If the spirometer includes an indicator to show your best effort, your nurse  or respiratory therapist will set it to a desired goal.  If possible, sit up straight or lean slightly forward. Try not to slouch.  Hold the incentive spirometer in an upright position. INSTRUCTIONS FOR USE  1. Sit on the edge of your bed if possible, or sit up as far as you can in bed or on a chair. 2. Hold the incentive spirometer in an upright position. 3. Breathe out normally. 4. Place the mouthpiece in your mouth and seal your lips tightly around it. 5. Breathe in slowly and as deeply as possible, raising the piston or the ball toward the top of the column. 6. Hold your breath for 3-5 seconds or for as long as possible. Allow the piston or ball to fall to the bottom of the column. 7. Remove the mouthpiece from your mouth and breathe out normally. 8. Rest for a few seconds and repeat Steps 1 through 7 at least 10 times every 1-2 hours when you are awake. Take your time and take a few normal breaths between deep breaths. 9. The spirometer may include an indicator to show your best effort. Use the indicator as a goal to work toward during each repetition. 10. After each set of 10 deep breaths, practice coughing to be sure your lungs are clear. If you have an incision (the cut made at the time of surgery), support your incision when coughing  by placing a pillow or rolled up towels firmly against it. Once you are able to get out of bed, walk around indoors and cough well. You may stop using the incentive spirometer when instructed by your caregiver.  RISKS AND COMPLICATIONS  Take your time so you do not get dizzy or light-headed.  If you are in pain, you may need to take or ask for pain medication before doing incentive spirometry. It is harder to take a deep breath if you are having pain. AFTER USE  Rest and breathe slowly and easily.  It can be helpful to keep track of a log of your progress. Your caregiver can provide you with a simple table to help with this. If you are using the spirometer at home, follow these instructions: [place] IF:   You are having difficultly using the spirometer.  You have trouble using the spirometer as often as instructed.  Your pain medication is not giving enough relief while using the spirometer.  You develop fever of 100.5 F (38.1 C) or higher. SEEK IMMEDIATE MEDICAL CARE IF:   You cough up bloody sputum that had not been present before.  You develop fever of 102 F (38.9 C) or greater.  You develop worsening pain at or near the incision site. MAKE SURE YOU:   Understand these instructions.  Will watch your condition.  Will get help right away if you are not doing well or get worse. Document Released: 01/13/2007 Document Revised: 11/25/2011 Document Reviewed: 03/16/2007 [facility] Patient Information 2014 [place], [place].   ________________________________________________________________________

## 2019-10-15 ENCOUNTER — Encounter (HOSPITAL_COMMUNITY)
Admission: RE | Admit: 2019-10-15 | Discharge: 2019-10-15 | Disposition: A | Discharge status: Home / Self Care | Payer: 59 | Source: Ambulatory Visit | Attending: Orthopedic Surgery | Admitting: Orthopedic Surgery

## 2019-10-15 ENCOUNTER — Other Ambulatory Visit: Payer: Self-pay

## 2019-10-15 ENCOUNTER — Other Ambulatory Visit (HOSPITAL_COMMUNITY)
Admission: RE | Admit: 2019-10-15 | Discharge: 2019-10-15 | Disposition: A | Discharge status: Home / Self Care | Payer: 59 | Source: Ambulatory Visit

## 2019-10-15 ENCOUNTER — Ambulatory Visit (HOSPITAL_COMMUNITY)
Admission: RE | Admit: 2019-10-15 | Discharge: 2019-10-15 | Disposition: A | Discharge status: Home / Self Care | Payer: 59 | Source: Ambulatory Visit | Attending: Orthopedic Surgery | Admitting: Orthopedic Surgery

## 2019-10-15 DIAGNOSIS — Z01811 Encounter for preprocedural respiratory examination: Secondary | ICD-10-CM | POA: Diagnosis not present

## 2019-10-15 DIAGNOSIS — R9431 Abnormal electrocardiogram [ECG] [EKG]: Secondary | ICD-10-CM | POA: Insufficient documentation

## 2019-10-15 DIAGNOSIS — Z20822 Contact with and (suspected) exposure to covid-19: Secondary | ICD-10-CM | POA: Insufficient documentation

## 2019-10-15 LAB — URINALYSIS, ROUTINE W REFLEX MICROSCOPIC
Bacteria, UA: NONE SEEN
Bilirubin Urine: NEGATIVE
Glucose, UA: >=500 mg/dL — AB
Hgb urine dipstick: NEGATIVE
Ketones, ur: NEGATIVE mg/dL
Leukocytes,Ua: NEGATIVE
Nitrite: NEGATIVE
Protein, ur: NEGATIVE mg/dL
Specific Gravity, Urine: 1.007 (ref 1.005–1.030)
pH: 5 (ref 5.0–8.0)

## 2019-10-15 LAB — COMPREHENSIVE METABOLIC PANEL
ALT: 23 U/L (ref 0–44)
AST: 24 U/L (ref 15–41)
Albumin: 4.3 g/dL (ref 3.5–5.0)
Alkaline Phosphatase: 51 U/L (ref 38–126)
Anion gap: 9 (ref 5–15)
BUN: 17 mg/dL (ref 6–20)
CO2: 25 mmol/L (ref 22–32)
Calcium: 9.5 mg/dL (ref 8.9–10.3)
Chloride: 101 mmol/L (ref 98–111)
Creatinine, Ser: 0.97 mg/dL (ref 0.61–1.24)
GFR calc Af Amer: >60 mL/min (ref >60)
GFR calc non Af Amer: >60 mL/min (ref >60)
Glucose, Bld: 104 mg/dL — ABNORMAL HIGH (ref 70–99)
Potassium: 4.2 mmol/L (ref 3.5–5.1)
Sodium: 135 mmol/L (ref 135–145)
Total Bilirubin: 1 mg/dL (ref 0.3–1.2)
Total Protein: 7.4 g/dL (ref 6.5–8.1)

## 2019-10-15 LAB — CBC WITH DIFFERENTIAL/PLATELET
Abs Immature Granulocytes: 0.03 10*3/uL (ref 0.00–0.07)
Basophils Absolute: 0.1 10*3/uL (ref 0.0–0.1)
Basophils Relative: 1 %
Eosinophils Absolute: 0.3 10*3/uL (ref 0.0–0.5)
Eosinophils Relative: 3 %
HCT: 42.2 % (ref 39.0–52.0)
Hemoglobin: 13.3 g/dL (ref 13.0–17.0)
Immature Granulocytes: 0 %
Lymphocytes Relative: 15 %
Lymphs Abs: 1.2 10*3/uL (ref 0.7–4.0)
MCH: 32.6 pg (ref 26.0–34.0)
MCHC: 31.5 g/dL (ref 30.0–36.0)
MCV: 103.4 fL — ABNORMAL HIGH (ref 80.0–100.0)
Monocytes Absolute: 0.9 10*3/uL (ref 0.1–1.0)
Monocytes Relative: 11 %
Neutro Abs: 5.5 10*3/uL (ref 1.7–7.7)
Neutrophils Relative %: 70 %
Platelets: 302 10*3/uL (ref 150–400)
RBC: 4.08 MIL/uL — ABNORMAL LOW (ref 4.22–5.81)
RDW: 12.6 % (ref 11.5–15.5)
WBC: 7.9 10*3/uL (ref 4.0–10.5)
nRBC: 0 % (ref 0.0–0.2)

## 2019-10-15 LAB — SURGICAL PCR SCREEN
MRSA, PCR: NEGATIVE
Staphylococcus aureus: NEGATIVE

## 2019-10-15 LAB — APTT: aPTT: 36 seconds (ref 24–36)

## 2019-10-15 LAB — SARS CORONAVIRUS 2 (TAT 6-24 HRS): SARS Coronavirus 2: NEGATIVE

## 2019-10-15 LAB — PROTIME-INR
INR: 1.2 (ref 0.8–1.2)
Prothrombin Time: 14.6 seconds (ref 11.4–15.2)

## 2019-10-15 LAB — ABO/RH: ABO/RH(D): O POS

## 2019-10-18 NOTE — Progress Notes (Addendum)
Anesthesia Chart Review   Case: [id number] Date/Time: 10/19/19 0715   Procedure: REVERSE TOTAL SHOULDER ARTHROPLASTY (Left Shoulder)   Anesthesia type: Choice   Pre-op diagnosis: LEFT SHOULDER POST TRAUMATIC ARTHRITIS   Location: [facility] ROOM 10 / [facility]   Surgeons: Jones Broom, MD      DISCUSSION:58 y.o. never smoker with h/o nonischemic cardiomyopathy now with [employer] device with last interrogation 09/14/2019 (Device orders requested), A-fib (on Eliquis), OSA, DM II, , ETOH abuse, left shoulder OA scheduled for above procedure 10/19/19 with Dr. Jones Broom.   LHC in 2/18 showed normal coronaries.     Last dose of Eliquis 10/15/19  Pt last seen by cardiologist, Dr. Marca Ancona, 09/14/2019.  Per OV note, "He could have surgery after 1 month of uninterrupted anticoagulation post-DCCV.  Also needs to cut back on ETOH prior to surgery."  S/p DCCV 09/01/2019, has completed 1 month of uninterrupted anticoagulation.   VS: Pulse (P) 65   Resp (P) 16   Ht (P) 5\' 11"  (1.803 m)   Wt (P) 113.4 kg   SpO2 (P) 99%   BMI (P) 34.87 kg/m   PROVIDERS: , MD is PCP   Margaree Mackintosh, MD is Cardiologist  LABS: Labs reviewed: Acceptable for surgery. (all labs ordered are listed, but only abnormal results are displayed)  Labs Reviewed  CBC WITH DIFFERENTIAL/PLATELET - Abnormal; Notable for the following components:      Result Value   RBC 4.08 (*)    MCV 103.4 (*)    All other components within normal limits  COMPREHENSIVE METABOLIC PANEL - Abnormal; Notable for the following components:   Glucose, Bld 104 (*)    All other components within normal limits  URINALYSIS, ROUTINE W REFLEX MICROSCOPIC - Abnormal; Notable for the following components:   Glucose, UA >=500 (*)    All other components within normal limits  SURGICAL PCR SCREEN  APTT  PROTIME-INR  TYPE AND SCREEN  ABO/RH     IMAGES: Chest Xray 10/15/19 FINDINGS: The heart size and mediastinal contours are within  normal limits. Single lead pacemaker remains in appropriate position. Both lungs are clear. Multiple old right rib fracture deformities are again seen.  IMPRESSION: No active cardiopulmonary disease.  EKG: 10/15/19 Rate 62 bpm Normal sinus rhythm  Low voltage QRS Abnormal ECG  Compared to previous tracing, PVC asent   CV: Echo 09/01/2019 IMPRESSIONS   1. Left ventricular ejection fraction, by visual estimation, is 25 to  30%. The left ventricle has severely decreased function. There is no left  ventricular hypertrophy.  2. Mildly dilated left ventricular internal cavity size.  3. The left ventricle demonstrates global hypokinesis.  4. Global right ventricle has mildly reduced systolic function.The right  ventricular size is normal. No increase in right ventricular wall  thickness.  5. Left atrial size was moderately dilated. No LA appendage thrombus.  6. Right atrial size was normal.  7. The mitral valve is normal in structure. Trivial mitral valve  regurgitation. No evidence of mitral stenosis.  8. The tricuspid valve is normal in structure.  9. The pulmonic valve was normal in structure. Pulmonic valve  regurgitation is not visualized.  10. The aortic valve is tricuspid. Aortic valve regurgitation is not  visualized. No evidence of aortic valve sclerosis or stenosis.  11. Normal caliber thoracic aorta with mild plaque in the descending  thoracic aorta.  Past Medical History:  Diagnosis Date  . Anxiety    Ativan  . Chronic combined systolic and  diastolic CHF, NYHA class 3 ([place]) 10/2016   Nonischemic cardiomyopathy. EF 20-25%.  . Chronic left hip pain   . Depression    history of  . Dysrhythmia    A-FIB  . GI bleed 03/2016  . Headache   . Hypertensive heart disease with combined systolic and diastolic congestive heart failure ([place]) 10/2016  . Nonischemic cardiomyopathy ([place]) 10/2016   Echo with EF 20-25%. Cardiac catheterization with no CAD. LVEDP was 41  mmHg, PCWP 36 mmHg  . Osteoarthritis    right shoulder  . Right hand fracture   . Seasonal allergies   . Sleep apnea    wears a CPAP  . Wears glasses     Past Surgical History:  Procedure Laterality Date  . CARDIAC CATHETERIZATION    . CARDIOVERSION N/A 09/01/2019   Procedure: CARDIOVERSION;  Surgeon: Larey Dresser, MD;  Location: [facility] ENDOSCOPY;  Service: Cardiovascular;  Laterality: N/A;  . ESOPHAGOGASTRODUODENOSCOPY (EGD) WITH PROPOFOL N/A 03/20/2016   Procedure: ESOPHAGOGASTRODUODENOSCOPY (EGD) WITH PROPOFOL;  Surgeon: Wilford Corner, MD;  Location: [facility] ENDOSCOPY;  Service: Endoscopy;  Laterality: N/A;  . ICD IMPLANT N/A 05/06/2018   Procedure: ICD IMPLANT;  Surgeon: Sanda Klein, MD;  Location: [place] CV LAB;  Service: Cardiovascular;  Laterality: N/A;  . RIGHT/LEFT HEART CATH AND CORONARY ANGIOGRAPHY N/A 10/21/2016   Procedure: Right/Left Heart Cath and Coronary Angiography;  Surgeon: Lorretta Harp, MD;  Location: [place] CV LAB;  Service: Cardiovascular: Angiographically normal coronary arteries. PCWP 33-36 mmHg, LVEDP 41 mmHg. PA pressure 60/35, mean 46 mmHg.  Cardiac output/cardiac index-3.93 /1.76 ([place]), 3.49/1.57 (thermodilution)  . TEE WITHOUT CARDIOVERSION N/A 09/01/2019   Procedure: TRANSESOPHAGEAL ECHOCARDIOGRAM (TEE);  Surgeon: Larey Dresser, MD;  Location: [facility] ENDOSCOPY;  Service: Cardiovascular;  Laterality: N/A;  . TOTAL HIP ARTHROPLASTY Left 03/27/2015   Procedure: LEFT TOTAL HIP ARTHROPLASTY ANTERIOR APPROACH;  Surgeon: Rod Can, MD;  Location: [place];  Service: Orthopedics;  Laterality: Left;  . TOTAL SHOULDER ARTHROPLASTY Right 11/24/2015   Procedure: RIGHT TOTAL SHOULDER ARTHROPLASTY;  Surgeon: Netta Cedars, MD;  Location: [place];  Service: Orthopedics;  Laterality: Right;  . TRANSTHORACIC ECHOCARDIOGRAM  10/2016   EF 20-25%. Diffuse hypokinesis but akinesis of the entire inferoseptal wall and apical wall. Moderate biatrial enlargement. PA pressure  estimated 64 mmHg.  . WISDOM TOOTH EXTRACTION      MEDICATIONS: . acetaminophen (TYLENOL) 325 MG tablet  . apixaban (ELIQUIS) 5 MG TABS tablet  . carvedilol (COREG) 12.5 MG tablet  . dapagliflozin propanediol (FARXIGA) 10 MG TABS tablet  . furosemide (LASIX) 40 MG tablet  . LORazepam (ATIVAN) 1 MG tablet  . Multiple Vitamins-Minerals (MULTIVITAMIN WITH MINERALS) tablet  . potassium chloride SA (KLOR-CON M20) 20 MEQ tablet  . rosuvastatin (CRESTOR) 20 MG tablet  . sacubitril-valsartan (ENTRESTO) 49-51 MG  . sertraline (ZOLOFT) 50 MG tablet  . sildenafil (VIAGRA) 100 MG tablet  . spironolactone (ALDACTONE) 25 MG tablet  . traMADol (ULTRAM) 50 MG tablet   No current facility-administered medications for this encounter.     Maia Plan [facility] Pre-Surgical Testing [phone] 10/18/19  2:28 PM

## 2019-10-18 NOTE — Progress Notes (Signed)
Device orders faxed today to Dr Marca Ancona.

## 2019-10-18 NOTE — Anesthesia Preprocedure Evaluation (Addendum)
Anesthesia Evaluation  Patient identified by MRN, date of birth, ID band Patient awake    Reviewed: Allergy & Precautions, NPO status , Patient's Chart, lab work & pertinent test results, reviewed documented beta blocker date and time   History of Anesthesia Complications Negative for: history of anesthetic complications  Airway Mallampati: II  TM Distance: >3 FB Neck ROM: Full    Dental  (+) Teeth Intact   Pulmonary sleep apnea ,    Pulmonary exam normal        Cardiovascular hypertension, +CHF  Normal cardiovascular exam+ dysrhythmias Atrial Fibrillation + Cardiac Defibrillator      Neuro/Psych PSYCHIATRIC DISORDERS Anxiety Depression negative neurological ROS     GI/Hepatic negative GI ROS, (+)     substance abuse  alcohol use,   Endo/Other  diabetes, Type 2  Renal/GU negative Renal ROS  negative genitourinary   Musculoskeletal  (+) Arthritis , Osteoarthritis,    Abdominal   Peds  Hematology negative hematology ROS (+)   Anesthesia Other Findings Echo 09/01/19: EF 25-30%, mildly reduced RV function  Reproductive/Obstetrics                            Anesthesia Physical Anesthesia Plan  ASA: IV  Anesthesia Plan: General   Post-op Pain Management: GA combined w/ Regional for post-op pain   Induction: Intravenous  PONV Risk Score and Plan: 2 and Ondansetron, Dexamethasone, Treatment may vary due to age or medical condition and Midazolam  Airway Management Planned: Oral ETT  Additional Equipment: Arterial line  Intra-op Plan:   Post-operative Plan: Extubation in OR  Informed Consent: I have reviewed the patients History and Physical, chart, labs and discussed the procedure including the risks, benefits and alternatives for the proposed anesthesia with the patient or authorized representative who has indicated his/her understanding and acceptance.     Dental advisory  given  Plan Discussed with:   Anesthesia Plan Comments: (Will require pacemaker/AICD reprogramming and defibrillator pads in place prior to induction. Arterial line for hemodynamic monitoring.)      Anesthesia Quick Evaluation

## 2019-10-19 ENCOUNTER — Encounter (HOSPITAL_COMMUNITY)
Admission: RE | Disposition: A | Discharge status: Home / Self Care | Payer: Self-pay | Source: Home / Self Care | Attending: Orthopedic Surgery

## 2019-10-19 ENCOUNTER — Encounter (HOSPITAL_COMMUNITY): Payer: Self-pay | Admitting: Orthopedic Surgery

## 2019-10-19 ENCOUNTER — Ambulatory Visit (HOSPITAL_COMMUNITY)
Admission: RE | Admit: 2019-10-19 | Discharge: 2019-10-19 | Disposition: A | Discharge status: Home / Self Care | Payer: 59 | Attending: Orthopedic Surgery | Admitting: Orthopedic Surgery

## 2019-10-19 ENCOUNTER — Ambulatory Visit (HOSPITAL_COMMUNITY): Payer: 59

## 2019-10-19 ENCOUNTER — Ambulatory Visit (HOSPITAL_COMMUNITY): Payer: 59 | Admitting: Certified Registered Nurse Anesthetist

## 2019-10-19 ENCOUNTER — Ambulatory Visit (HOSPITAL_COMMUNITY): Payer: 59 | Admitting: Physician Assistant

## 2019-10-19 DIAGNOSIS — M25552 Pain in left hip: Secondary | ICD-10-CM | POA: Insufficient documentation

## 2019-10-19 DIAGNOSIS — I110 Hypertensive heart disease with heart failure: Secondary | ICD-10-CM | POA: Insufficient documentation

## 2019-10-19 DIAGNOSIS — Z6834 Body mass index (BMI) 34.0-34.9, adult: Secondary | ICD-10-CM | POA: Diagnosis not present

## 2019-10-19 DIAGNOSIS — Z96642 Presence of left artificial hip joint: Secondary | ICD-10-CM | POA: Diagnosis not present

## 2019-10-19 DIAGNOSIS — F329 Major depressive disorder, single episode, unspecified: Secondary | ICD-10-CM | POA: Insufficient documentation

## 2019-10-19 DIAGNOSIS — I4891 Unspecified atrial fibrillation: Secondary | ICD-10-CM | POA: Diagnosis not present

## 2019-10-19 DIAGNOSIS — X58XXXS Exposure to other specified factors, sequela: Secondary | ICD-10-CM | POA: Diagnosis not present

## 2019-10-19 DIAGNOSIS — E669 Obesity, unspecified: Secondary | ICD-10-CM | POA: Insufficient documentation

## 2019-10-19 DIAGNOSIS — S4292XS Fracture of left shoulder girdle, part unspecified, sequela: Secondary | ICD-10-CM | POA: Diagnosis not present

## 2019-10-19 DIAGNOSIS — E119 Type 2 diabetes mellitus without complications: Secondary | ICD-10-CM | POA: Insufficient documentation

## 2019-10-19 DIAGNOSIS — Z79899 Other long term (current) drug therapy: Secondary | ICD-10-CM | POA: Insufficient documentation

## 2019-10-19 DIAGNOSIS — Z96612 Presence of left artificial shoulder joint: Secondary | ICD-10-CM

## 2019-10-19 DIAGNOSIS — I5042 Chronic combined systolic (congestive) and diastolic (congestive) heart failure: Secondary | ICD-10-CM | POA: Insufficient documentation

## 2019-10-19 DIAGNOSIS — Z7901 Long term (current) use of anticoagulants: Secondary | ICD-10-CM | POA: Diagnosis not present

## 2019-10-19 DIAGNOSIS — Z7984 Long term (current) use of oral hypoglycemic drugs: Secondary | ICD-10-CM | POA: Insufficient documentation

## 2019-10-19 DIAGNOSIS — M19112 Post-traumatic osteoarthritis, left shoulder: Secondary | ICD-10-CM | POA: Diagnosis present

## 2019-10-19 DIAGNOSIS — I428 Other cardiomyopathies: Secondary | ICD-10-CM | POA: Insufficient documentation

## 2019-10-19 DIAGNOSIS — Z8249 Family history of ischemic heart disease and other diseases of the circulatory system: Secondary | ICD-10-CM | POA: Insufficient documentation

## 2019-10-19 DIAGNOSIS — Z96611 Presence of right artificial shoulder joint: Secondary | ICD-10-CM | POA: Diagnosis not present

## 2019-10-19 DIAGNOSIS — M19011 Primary osteoarthritis, right shoulder: Secondary | ICD-10-CM | POA: Diagnosis not present

## 2019-10-19 DIAGNOSIS — G4730 Sleep apnea, unspecified: Secondary | ICD-10-CM | POA: Insufficient documentation

## 2019-10-19 DIAGNOSIS — F419 Anxiety disorder, unspecified: Secondary | ICD-10-CM | POA: Insufficient documentation

## 2019-10-19 DIAGNOSIS — G8929 Other chronic pain: Secondary | ICD-10-CM | POA: Insufficient documentation

## 2019-10-19 HISTORY — PX: TOTAL SHOULDER ARTHROPLASTY: SHX126

## 2019-10-19 LAB — TYPE AND SCREEN
ABO/RH(D): O POS
Antibody Screen: NEGATIVE

## 2019-10-19 LAB — GLUCOSE, CAPILLARY
Glucose-Capillary: 108 mg/dL — ABNORMAL HIGH (ref 70–99)
Glucose-Capillary: 130 mg/dL — ABNORMAL HIGH (ref 70–99)

## 2019-10-19 SURGERY — ARTHROPLASTY, SHOULDER, TOTAL
Anesthesia: General | Site: Shoulder | Laterality: Left

## 2019-10-19 MED ORDER — CHLORHEXIDINE GLUCONATE 4 % EX LIQD: 60.0000 mL | Freq: Once | CUTANEOUS | Status: DC

## 2019-10-19 MED ORDER — CEFAZOLIN SODIUM-DEXTROSE 2-4 GM/100ML-% IV SOLN
2.0000 g | INTRAVENOUS | Status: AC
  Administered 2019-10-19: 2 g via INTRAVENOUS
  Filled 2019-10-19: qty 100

## 2019-10-19 MED ORDER — SUGAMMADEX SODIUM 200 MG/2ML IV SOLN
INTRAVENOUS | Status: DC | PRN
  Administered 2019-10-19: 300 mg via INTRAVENOUS

## 2019-10-19 MED ORDER — TIZANIDINE HCL 4 MG PO TABS: 4.0000 mg | ORAL_TABLET | Freq: Three times a day (TID) | ORAL | 1 refills | Status: DC | PRN

## 2019-10-19 MED ORDER — STERILE WATER FOR IRRIGATION IR SOLN
Status: DC | PRN
  Administered 2019-10-19: 2000 mL

## 2019-10-19 MED ORDER — DEXAMETHASONE SODIUM PHOSPHATE 10 MG/ML IJ SOLN
INTRAMUSCULAR | Status: DC | PRN
  Administered 2019-10-19: 10 mg via INTRAVENOUS

## 2019-10-19 MED ORDER — SUGAMMADEX SODIUM 200 MG/2ML IV SOLN: INTRAVENOUS | Status: DC | PRN

## 2019-10-19 MED ORDER — MIDAZOLAM HCL 5 MG/5ML IJ SOLN
INTRAMUSCULAR | Status: DC | PRN
  Administered 2019-10-19 (×2): 1 mg via INTRAVENOUS

## 2019-10-19 MED ORDER — SUGAMMADEX SODIUM 500 MG/5ML IV SOLN
INTRAVENOUS | Status: AC
  Filled 2019-10-19: qty 5

## 2019-10-19 MED ORDER — ONDANSETRON HCL 4 MG/2ML IJ SOLN
INTRAMUSCULAR | Status: DC | PRN
  Administered 2019-10-19: 4 mg via INTRAVENOUS

## 2019-10-19 MED ORDER — SODIUM CHLORIDE 0.9 % IV SOLN
INTRAVENOUS | Status: DC | PRN
  Administered 2019-10-19: 1000 mL

## 2019-10-19 MED ORDER — ROCURONIUM BROMIDE 10 MG/ML (PF) SYRINGE
PREFILLED_SYRINGE | INTRAVENOUS | Status: AC
  Filled 2019-10-19: qty 10

## 2019-10-19 MED ORDER — FENTANYL CITRATE (PF) 100 MCG/2ML IJ SOLN
INTRAMUSCULAR | Status: AC
  Filled 2019-10-19: qty 2

## 2019-10-19 MED ORDER — MIDAZOLAM HCL 2 MG/2ML IJ SOLN
INTRAMUSCULAR | Status: AC
  Filled 2019-10-19: qty 2

## 2019-10-19 MED ORDER — TRANEXAMIC ACID-NACL 1000-0.7 MG/100ML-% IV SOLN
1000.0000 mg | INTRAVENOUS | Status: AC
  Administered 2019-10-19: 1000 mg via INTRAVENOUS
  Filled 2019-10-19: qty 100

## 2019-10-19 MED ORDER — FENTANYL CITRATE (PF) 250 MCG/5ML IJ SOLN
INTRAMUSCULAR | Status: AC
  Filled 2019-10-19: qty 5

## 2019-10-19 MED ORDER — EPHEDRINE 5 MG/ML INJ
INTRAVENOUS | Status: AC
  Filled 2019-10-19: qty 10

## 2019-10-19 MED ORDER — FENTANYL CITRATE (PF) 100 MCG/2ML IJ SOLN
25.0000 ug | INTRAMUSCULAR | Status: DC | PRN
  Administered 2019-10-19 (×3): 50 ug via INTRAVENOUS

## 2019-10-19 MED ORDER — OXYCODONE HCL 5 MG/5ML PO SOLN: 5.0000 mg | Freq: Once | ORAL | Status: DC | PRN

## 2019-10-19 MED ORDER — OXYCODONE-ACETAMINOPHEN 5-325 MG PO TABS: ORAL_TABLET | ORAL | 0 refills | Status: DC

## 2019-10-19 MED ORDER — ONDANSETRON HCL 4 MG/2ML IJ SOLN
INTRAMUSCULAR | Status: AC
  Filled 2019-10-19: qty 2

## 2019-10-19 MED ORDER — BUPIVACAINE LIPOSOME 1.3 % IJ SUSP
INTRAMUSCULAR | Status: DC | PRN
  Administered 2019-10-19: 10 mL

## 2019-10-19 MED ORDER — PROPOFOL 10 MG/ML IV BOLUS
INTRAVENOUS | Status: DC | PRN
  Administered 2019-10-19: 120 mg via INTRAVENOUS

## 2019-10-19 MED ORDER — EPHEDRINE SULFATE-NACL 50-0.9 MG/10ML-% IV SOSY
PREFILLED_SYRINGE | INTRAVENOUS | Status: DC | PRN
  Administered 2019-10-19: 5 mg via INTRAVENOUS
  Administered 2019-10-19: 10 mg via INTRAVENOUS
  Administered 2019-10-19 (×2): 5 mg via INTRAVENOUS

## 2019-10-19 MED ORDER — SUCCINYLCHOLINE CHLORIDE 200 MG/10ML IV SOSY
PREFILLED_SYRINGE | INTRAVENOUS | Status: AC
  Filled 2019-10-19: qty 10

## 2019-10-19 MED ORDER — LACTATED RINGERS IV SOLN: INTRAVENOUS | Status: DC

## 2019-10-19 MED ORDER — PHENYLEPHRINE HCL-NACL 10-0.9 MG/250ML-% IV SOLN
INTRAVENOUS | Status: DC | PRN
  Administered 2019-10-19: 35 ug/min via INTRAVENOUS

## 2019-10-19 MED ORDER — LIDOCAINE 2% (20 MG/ML) 5 ML SYRINGE
INTRAMUSCULAR | Status: DC | PRN
  Administered 2019-10-19: 60 mg via INTRAVENOUS

## 2019-10-19 MED ORDER — ROCURONIUM BROMIDE 50 MG/5ML IV SOSY
PREFILLED_SYRINGE | INTRAVENOUS | Status: DC | PRN
  Administered 2019-10-19 (×2): 10 mg via INTRAVENOUS
  Administered 2019-10-19: 80 mg via INTRAVENOUS

## 2019-10-19 MED ORDER — ONDANSETRON HCL 4 MG/2ML IJ SOLN: 4.0000 mg | Freq: Once | INTRAMUSCULAR | Status: DC | PRN

## 2019-10-19 MED ORDER — FENTANYL CITRATE (PF) 100 MCG/2ML IJ SOLN
INTRAMUSCULAR | Status: DC | PRN
  Administered 2019-10-19 (×2): 50 ug via INTRAVENOUS
  Administered 2019-10-19: 25 ug via INTRAVENOUS

## 2019-10-19 MED ORDER — LIDOCAINE 2% (20 MG/ML) 5 ML SYRINGE
INTRAMUSCULAR | Status: AC
  Filled 2019-10-19: qty 5

## 2019-10-19 MED ORDER — DEXAMETHASONE SODIUM PHOSPHATE 10 MG/ML IJ SOLN
INTRAMUSCULAR | Status: AC
  Filled 2019-10-19: qty 1

## 2019-10-19 MED ORDER — BUPIVACAINE HCL (PF) 0.5 % IJ SOLN
INTRAMUSCULAR | Status: DC | PRN
  Administered 2019-10-19: 15 mL via PERINEURAL

## 2019-10-19 MED ORDER — 0.9 % SODIUM CHLORIDE (POUR BTL) OPTIME
TOPICAL | Status: DC | PRN
  Administered 2019-10-19: 1000 mL

## 2019-10-19 MED ORDER — OXYCODONE HCL 5 MG PO TABS: 5.0000 mg | ORAL_TABLET | Freq: Once | ORAL | Status: DC | PRN

## 2019-10-19 MED ORDER — PROPOFOL 10 MG/ML IV BOLUS
INTRAVENOUS | Status: AC
  Filled 2019-10-19: qty 20

## 2019-10-19 SURGICAL SUPPLY — 74 items
BAG ZIPLOCK 12X15 (MISCELLANEOUS) ×2 IMPLANT
BASEPLATE P2 COATD GLND 6.5X30 (Shoulder) ×1 IMPLANT
BIT DRILL 1.6MX128 (BIT) ×2 IMPLANT
BIT DRILL 2.5 DIA 127 CALI (BIT) ×2 IMPLANT
BIT DRILL 4 DIA CALIBRATED (BIT) ×2 IMPLANT
BLADE SAW SAG 73X25 THK (BLADE) ×1
BLADE SAW SGTL 18X1.27X75 (BLADE) IMPLANT
BLADE SAW SGTL 73X25 THK (BLADE) ×1 IMPLANT
CHLORAPREP W/TINT 26 (MISCELLANEOUS) ×4 IMPLANT
CLSR STERI-STRIP ANTIMIC 1/2X4 (GAUZE/BANDAGES/DRESSINGS) ×2 IMPLANT
COOLER ICEMAN CLASSIC (MISCELLANEOUS) IMPLANT
COVER BACK TABLE 60X90IN (DRAPES) ×2 IMPLANT
COVER SURGICAL LIGHT HANDLE (MISCELLANEOUS) ×2 IMPLANT
COVER WAND RF STERILE (DRAPES) IMPLANT
DRAPE INCISE IOBAN 66X45 STRL (DRAPES) ×2 IMPLANT
DRAPE ORTHO SPLIT 77X108 STRL (DRAPES) ×2
DRAPE POUCH INSTRU U-SHP 10X18 (DRAPES) ×2 IMPLANT
DRAPE SHEET LG 3/4 BI-LAMINATE (DRAPES) ×4 IMPLANT
DRAPE SURG 17X11 SM STRL (DRAPES) ×2 IMPLANT
DRAPE SURG ORHT 6 SPLT 77X108 (DRAPES) ×2 IMPLANT
DRAPE U-SHAPE 47X51 STRL (DRAPES) ×2 IMPLANT
DRSG AQUACEL AG ADV 3.5X 6 (GAUZE/BANDAGES/DRESSINGS) ×2 IMPLANT
ELECT BLADE TIP CTD 4 INCH (ELECTRODE) ×2 IMPLANT
ELECT REM PT RETURN 15FT ADLT (MISCELLANEOUS) ×2 IMPLANT
GLOVE BIO SURGEON STRL SZ7 (GLOVE) ×2 IMPLANT
GLOVE BIO SURGEON STRL SZ7.5 (GLOVE) ×2 IMPLANT
GLOVE BIOGEL PI IND STRL 7.0 (GLOVE) ×1 IMPLANT
GLOVE BIOGEL PI IND STRL 8 (GLOVE) ×1 IMPLANT
GLOVE BIOGEL PI INDICATOR 7.0 (GLOVE) ×1
GLOVE BIOGEL PI INDICATOR 8 (GLOVE) ×1
GOWN STRL REUS W/TWL LRG LVL3 (GOWN DISPOSABLE) ×4 IMPLANT
GOWN STRL REUS W/TWL XL LVL3 (GOWN DISPOSABLE) ×2 IMPLANT
HANDPIECE INTERPULSE COAX TIP (DISPOSABLE) ×1
HOOD PEEL AWAY FLYTE STAYCOOL (MISCELLANEOUS) ×6 IMPLANT
INSERT EPOLY STND HUMERUS+4 32 (Shoulder) ×2 IMPLANT
INSERT EPOLYSTD HUMERUS+4 32 (Shoulder) ×1 IMPLANT
KIT BASIN OR (CUSTOM PROCEDURE TRAY) ×2 IMPLANT
KIT TURNOVER KIT A (KITS) IMPLANT
MANIFOLD NEPTUNE II (INSTRUMENTS) ×2 IMPLANT
NEEDLE TROCAR POINT SZ 2 1/2 (NEEDLE) ×2 IMPLANT
NS IRRIG 1000ML POUR BTL (IV SOLUTION) ×2 IMPLANT
P2 COATDE GLNOID BSEPLT 6.5X30 (Shoulder) ×2 IMPLANT
PACK SHOULDER (CUSTOM PROCEDURE TRAY) ×2 IMPLANT
PAD COLD SHLDR WRAP-ON (PAD) IMPLANT
PENCIL SMOKE EVACUATOR (MISCELLANEOUS) IMPLANT
PROTECTOR NERVE ULNAR (MISCELLANEOUS) ×2 IMPLANT
RESTRAINT HEAD UNIVERSAL NS (MISCELLANEOUS) ×2 IMPLANT
RETRIEVER SUT HEWSON (MISCELLANEOUS) ×2 IMPLANT
SCREW BONE LOCKING RSP 5.0X14 (Screw) ×2 IMPLANT
SCREW BONE LOCKING RSP 5.0X30 (Screw) ×4 IMPLANT
SCREW BONE RSP LOCK 5X14 (Screw) ×1 IMPLANT
SCREW BONE RSP LOCK 5X18 (Screw) ×1 IMPLANT
SCREW BONE RSP LOCK 5X30 (Screw) ×2 IMPLANT
SCREW BONE RSP LOCKING 18MM LG (Screw) ×2 IMPLANT
SCREW RETAIN W/HEAD 4MM OFFSET (Shoulder) ×2 IMPLANT
SET HNDPC FAN SPRY TIP SCT (DISPOSABLE) ×1 IMPLANT
SLING ARM IMMOBILIZER LRG (SOFTGOODS) ×2 IMPLANT
SMARTMIX MINI TOWER (MISCELLANEOUS) ×2
SPONGE LAP 18X18 RF (DISPOSABLE) ×2 IMPLANT
STEM HUMERAL STD SHELL 8X48 (Miscellaneous) ×2 IMPLANT
STRIP CLOSURE SKIN 1/2X4 (GAUZE/BANDAGES/DRESSINGS) ×2 IMPLANT
SUCTION FRAZIER HANDLE 12FR (TUBING) ×1
SUCTION TUBE FRAZIER 12FR DISP (TUBING) ×1 IMPLANT
SUPPORT WRAP ARM LG (MISCELLANEOUS) ×2 IMPLANT
SUT ETHIBOND 2 V 37 (SUTURE) ×2 IMPLANT
SUT MNCRL AB 4-0 PS2 18 (SUTURE) ×2 IMPLANT
SUT VIC AB 2-0 CT1 27 (SUTURE) ×1
SUT VIC AB 2-0 CT1 TAPERPNT 27 (SUTURE) ×1 IMPLANT
TAPE LABRALWHITE 1.5X36 (TAPE) IMPLANT
TAPE SUT LABRALTAP WHT/BLK (SUTURE) IMPLANT
TOWEL OR 17X26 10 PK STRL BLUE (TOWEL DISPOSABLE) ×2 IMPLANT
TOWER SMARTMIX MINI (MISCELLANEOUS) ×1 IMPLANT
WATER STERILE IRR 1000ML POUR (IV SOLUTION) ×2 IMPLANT
YANKAUER SUCT BULB TIP 10FT TU (MISCELLANEOUS) ×2 IMPLANT

## 2019-10-19 NOTE — Anesthesia Procedure Notes (Signed)
Procedure Name: Intubation Date/Time: 10/19/2019 8:22 AM Performed by: Maxwell Caul, CRNA Pre-anesthesia Checklist: Patient identified, Emergency Drugs available, Suction available and Patient being monitored Patient Re-evaluated:Patient Re-evaluated prior to induction Oxygen Delivery Method: Circle system utilized Preoxygenation: Pre-oxygenation with 100% oxygen Induction Type: IV induction Ventilation: Mask ventilation without difficulty Laryngoscope Size: Mac and 4 Grade View: Grade I Tube type: Oral Tube size: 7.5 mm Number of attempts: 1 Airway Equipment and Method: Stylet Placement Confirmation: ETT inserted through vocal cords under direct vision,  positive ETCO2 and breath sounds checked- equal and bilateral Secured at: 22 cm Tube secured with: Tape Dental Injury: Teeth and Oropharynx as per pre-operative assessment

## 2019-10-19 NOTE — Anesthesia Procedure Notes (Addendum)
Anesthesia Regional Block: Interscalene brachial plexus block   Pre-Anesthetic Checklist: ,, timeout performed, Correct Patient, Correct Site, Correct Laterality, Correct Procedure, Correct Position, site marked, Risks and benefits discussed,  Surgical consent,  Pre-op evaluation,  At surgeon's request and post-op pain management  Laterality: Left  Prep: chloraprep       Needles:  Injection technique: Single-shot  Needle Type: Echogenic Stimulator Needle     Needle Length: 9cm  Needle Gauge: 21     Additional Needles:   Procedures:,,,, ultrasound used (permanent image in chart),,,,  Narrative:  Start time: 10/19/2019 7:05 AM End time: 10/19/2019 7:10 AM Injection made incrementally with aspirations every 5 mL.  Performed by: Personally  Anesthesiologist: [name], MD  Additional Notes: Monitors applied. Injection made in 5cc increments. No resistance to injection. Good needle visualization. Patient tolerated procedure well.

## 2019-10-19 NOTE — Anesthesia Postprocedure Evaluation (Signed)
Anesthesia Post Note  Patient: John [name]  Procedure(s) Performed: REVERSE TOTAL SHOULDER ARTHROPLASTY (Left Shoulder)     Patient location during evaluation: PACU Anesthesia Type: General Level of consciousness: awake and alert Pain management: pain level controlled Vital Signs Assessment: post-procedure vital signs reviewed and stable Respiratory status: spontaneous breathing, nonlabored ventilation and respiratory function stable Cardiovascular status: blood pressure returned to baseline and stable Postop Assessment: no apparent nausea or vomiting Anesthetic complications: no    Last Vitals:  Vitals:   10/19/19 1130 10/19/19 1145  BP: 116/80 122/76  Pulse: 73 76  Resp: (!) 23 15  Temp:    SpO2: 93% 93%    Last Pain:  Vitals:   10/19/19 1130  TempSrc:   PainSc: 5                  [name]

## 2019-10-19 NOTE — Discharge Instructions (Signed)

## 2019-10-19 NOTE — Transfer of Care (Signed)
Immediate Anesthesia Transfer of Care Note  Patient: John [name]  Procedure(s) Performed: REVERSE TOTAL SHOULDER ARTHROPLASTY (Left Shoulder)  Patient Location: PACU  Anesthesia Type:General  Level of Consciousness: awake, alert  and oriented  Airway & Oxygen Therapy: Patient Spontanous Breathing and Patient connected to face mask oxygen  Post-op Assessment: Report given to RN and Post -op Vital signs reviewed and stable  Post vital signs: Reviewed and stable  Last Vitals:  Vitals Value Taken Time  BP 139/87 10/19/19 1030  Temp    Pulse 83 10/19/19 1034  Resp 16 10/19/19 1034  SpO2 100 % 10/19/19 1034  Vitals shown include unvalidated device data.  Last Pain:  Vitals:   10/19/19 0557  TempSrc:   PainSc: 5       Patients Stated Pain Goal: 4 (10/19/19 0557)  Complications: No apparent anesthesia complications

## 2019-10-19 NOTE — Anesthesia Procedure Notes (Signed)
Arterial Line Insertion Start/End2/10/2019 8:20 AM, 10/19/2019 8:24 AM Performed by: Epimenio Sarin, CRNA, CRNA  Patient location: OR. Preanesthetic checklist: patient identified, IV checked, site marked, risks and benefits discussed, surgical consent, monitors and equipment checked, pre-op evaluation, timeout performed and anesthesia consent Right, radial was placed Catheter size: 20 Fr Hand hygiene performed , maximum sterile barriers used  and Seldinger technique used  Attempts: 1 Procedure performed without using ultrasound guided technique. Following insertion, dressing applied and Biopatch. Post procedure assessment: normal and unchanged  Patient tolerated the procedure well with no immediate complications.

## 2019-10-19 NOTE — Op Note (Signed)
Procedure(s): REVERSE TOTAL SHOULDER ARTHROPLASTY Procedure Note  John [name] male 59 y.o. 10/19/2019   Preoperative diagnosis: Left shoulder posttraumatic arthritis  Postoperative diagnosis: Same  Procedure(s) and Anesthesia Type:    Left REVERSE TOTAL SHOULDER ARTHROPLASTY - Choice   Indications:  59 y.o. male advanced degenerative changes in the left shoulder status post severe fracture which was initially fixed with open reduction internal fixation and subsequent hardware removal.  He has gone on to have severe pain and limitation in motion in the shoulder and wished to move ahead with an arthroplasty procedure to try and decrease pain and improve function.  He had previously been ruled out for infection.  He understood increased risk related to revision surgery with significant scar tissue and wished to proceed with surgery.     Surgeon: Berline Lopes   Assistants: Damita Lack PA-C [facility] was present and scrubbed throughout the procedure and was essential in positioning, retraction, exposure, and closure)  Anesthesia: General endotracheal anesthesia with preoperative interscalene block given by the attending anesthesiologist    Procedure Detail  Left REVERSE TOTAL SHOULDER ARTHROPLASTY   Estimated Blood Loss:  300 mL         Drains: none  Blood Given: none          Specimens: none        Complications:  * No complications entered in OR log *         Disposition: PACU - hemodynamically stable.         Condition: stable      OPERATIVE FINDINGS: He was noted to have significant amount of scarring with preoperative range of motion to 85 degrees forward flexion and -5 external rotation at the side.  Significant amount of scar tissue was encountered at the time of surgery. A DJO Altivate pressfit reverse total shoulder arthroplasty was placed with a  size 8 stem, a 32-4 glenosphere, and a +4 mm poly insert. The base plate  fixation was very  good.  PROCEDURE: The patient was identified in the preoperative holding area  where I personally marked the operative site after verifying site, side,  and procedure with the patient. An interscalene block given by  the attending anesthesiologist in the holding area and the patient was taken back to the operating room where all extremities were  carefully padded in position after general anesthesia was induced. She  was placed in a beach-chair position and the operative upper extremity was  prepped and draped in a standard sterile fashion. An approximately 15-  cm incision was made through his previous surgical site which was few centimeters lateral to the coracoid.. Dissection was carried  down through subcutaneous tissues to the level of the deltopectoral interval which was difficult to ascertain secondary to severe scar tissue in this region.  The interval was carefully identified and dissected.  No identifiable cephalic vein remained.. The pectoralis major was  retracted medially. The subdeltoid space was developed and the lateral  edge of the conjoined tendon was identified.  There was significant scar tissue beneath the deltoid that had to be meticulously lysed from the proximal humerus and rotator cuff.  The undersurface of  conjoined tendon was palpated and the musculocutaneous nerve was not in  the field. Retractor was placed underneath the conjoined and second  retractor was placed lateral into the deltoid.  The  biceps tendon was scarred in place and the proximal 2 inches was cut and discarded..  The subscapularis was severely thinned and taken  down as a peel with the underlying capsule..  The  joint was then gently externally rotated while the capsule was released  from the humeral neck around to just beyond the 6 o'clock position.  Given the severe stiffness took a fair amount of exposure to be able to dislocate the joint.  At  this point, the joint was dislocated and the humeral  head was presented  into the wound. The excessive osteophyte formation was removed with a  large rongeur.  Superior aspect of the rotator cuff was released to allow adequate exposure.  The cutting guide was used to make the appropriate  head cut and the head was saved for potentially bone grafting.  The glenoid was exposed with the arm in an  abducted extended position. The anterior and posterior labrum were  completely excised and the capsule was released circumferentially to  allow for exposure of the glenoid for preparation. The 2.5 mm drill was  placed using the guide in 5-10 inferior angulation and the tap was then advanced in the same hole. Small and large reamers were then used. The tap was then removed and the Metaglene was then screwed in with very good purchase.  The peripheral guide was then used to drilled measured and filled peripheral locking screws. The size 32-4 glenosphere was then impacted on the [facility] taper and the central screw was placed. The humerus was then again exposed and the diaphyseal reamers were used followed by the metaphyseal reamers. The final broach was left in place in the proximal trial was placed. The joint was reduced and with this implant it was felt that soft tissue tensioning was appropriate the posterior capsule was noted to be still a bit tight and with external rotation there was a bit of hinging on the implant but this was not felt to be unstable.  The +4 implant did improve the stability and tissue tension.  Therefore, final humeral stem was placed press-fit with bone grafting.  And then the trial polyethylene inserts were tested again and the above implant was felt to be the most appropriate for final insertion. The joint was reduced taken through full range of motion and felt to be stable. Soft tissue tension was appropriate.  The joint was then copiously irrigated with pulse  lavage and the wound was then closed. The subscapularis was not repaired.  Skin was  closed with 2-0 Vicryl in a deep dermal layer and 4-0  Monocryl for skin closure. Steri-Strips were applied. Sterile  dressings were then applied as well as a sling. The patient was allowed  to awaken from general anesthesia, transferred to stretcher, and taken  to recovery room in stable condition.   POSTOPERATIVE PLAN: Patient will be observed in the recovery room and if he has adequate pain control and is physiologically stable he will be discharged home with family today.  If there is concern he can be observed overnight.

## 2019-10-19 NOTE — H&P (Signed)
John [name] is an 59 y.o. male.   Chief Complaint: Left shoulder pain and dysfunction HPI: History of significant left shoulder fracture status post ORIF with subsequent hardware removal and continued pain.  He was noted to have some collapse and degeneration of the humeral head.  Indicated for revision to arthroplasty.  I feel that reverse total shoulder has the highest chance of meaningful recovery of function.  Past Medical History:  Diagnosis Date  . Anxiety    Ativan  . Chronic combined systolic and diastolic CHF, NYHA class 3 (HCC) 10/2016   Nonischemic cardiomyopathy. EF 20-25%.  . Chronic left hip pain   . Depression    history of  . Dysrhythmia    A-FIB  . GI bleed 03/2016  . Headache   . Hypertensive heart disease with combined systolic and diastolic congestive heart failure (HCC) 10/2016  . Nonischemic cardiomyopathy (HCC) 10/2016   Echo with EF 20-25%. Cardiac catheterization with no CAD. LVEDP was 41 mmHg, PCWP 36 mmHg  . Osteoarthritis    right shoulder  . Right hand fracture   . Seasonal allergies   . Sleep apnea    wears a CPAP  . Wears glasses     Past Surgical History:  Procedure Laterality Date  . CARDIAC CATHETERIZATION    . CARDIOVERSION N/A 09/01/2019   Procedure: CARDIOVERSION;  Surgeon: Laurey Morale, MD;  Location: [facility] ENDOSCOPY;  Service: Cardiovascular;  Laterality: N/A;  . ESOPHAGOGASTRODUODENOSCOPY (EGD) WITH PROPOFOL N/A 03/20/2016   Procedure: ESOPHAGOGASTRODUODENOSCOPY (EGD) WITH PROPOFOL;  Surgeon: Charlott Rakes, MD;  Location: [facility] ENDOSCOPY;  Service: Endoscopy;  Laterality: N/A;  . ICD IMPLANT N/A 05/06/2018   Procedure: ICD IMPLANT;  Surgeon: Thurmon Fair, MD;  Location: [facility] INVASIVE CV LAB;  Service: Cardiovascular;  Laterality: N/A;  . RIGHT/LEFT HEART CATH AND CORONARY ANGIOGRAPHY N/A 10/21/2016   Procedure: Right/Left Heart Cath and Coronary Angiography;  Surgeon: Runell Gess, MD;  Location: [facility] INVASIVE CV LAB;  Service:  Cardiovascular: Angiographically normal coronary arteries. PCWP 33-36 mmHg, LVEDP 41 mmHg. PA pressure 60/35, mean 46 mmHg.  Cardiac output/cardiac index-3.93 /1.76 (Fick), 3.49/1.57 (thermodilution)  . TEE WITHOUT CARDIOVERSION N/A 09/01/2019   Procedure: TRANSESOPHAGEAL ECHOCARDIOGRAM (TEE);  Surgeon: Laurey Morale, MD;  Location: [facility] ENDOSCOPY;  Service: Cardiovascular;  Laterality: N/A;  . TOTAL HIP ARTHROPLASTY Left 03/27/2015   Procedure: LEFT TOTAL HIP ARTHROPLASTY ANTERIOR APPROACH;  Surgeon: Samson Frederic, MD;  Location: [facility] OR;  Service: Orthopedics;  Laterality: Left;  . TOTAL SHOULDER ARTHROPLASTY Right 11/24/2015   Procedure: RIGHT TOTAL SHOULDER ARTHROPLASTY;  Surgeon: Beverely Low, MD;  Location: [facility] OR;  Service: Orthopedics;  Laterality: Right;  . TRANSTHORACIC ECHOCARDIOGRAM  10/2016   EF 20-25%. Diffuse hypokinesis but akinesis of the entire inferoseptal wall and apical wall. Moderate biatrial enlargement. PA pressure estimated 64 mmHg.  . WISDOM TOOTH EXTRACTION      Family History  Problem Relation Age of Onset  . Congenital heart disease Sister    Social History:  reports that he has never smoked. He has never used smokeless tobacco. He reports current alcohol use of about 3.0 standard drinks of alcohol per week. He reports that he does not use drugs.  Allergies: No Known Allergies  Medications Prior to Admission  Medication Sig Dispense Refill  . acetaminophen (TYLENOL) 325 MG tablet Take 650 mg by mouth every 6 (six) hours as needed for mild pain.    Marland Kitchen apixaban (ELIQUIS) 5 MG TABS tablet Take 1 tablet (5 mg total) by  mouth 2 (two) times daily. 60 tablet 6  . carvedilol (COREG) 12.5 MG tablet Take 1 tablet (12.5 mg total) by mouth 2 (two) times daily with a meal. 60 tablet 6  . dapagliflozin propanediol (FARXIGA) 10 MG TABS tablet Take 10 mg by mouth daily before breakfast. (Patient taking differently: Take 10 mg by mouth daily. ) 30 tablet 11  . furosemide (LASIX) 40  MG tablet Take 1 tablet (40 mg total) by mouth daily. 30 tablet 5  . LORazepam (ATIVAN) 1 MG tablet Take 0.5-1 mg by mouth every 8 (eight) hours as needed for anxiety.    . Multiple Vitamins-Minerals (MULTIVITAMIN WITH MINERALS) tablet Take 1 tablet by mouth daily.    . potassium chloride SA (KLOR-CON M20) 20 MEQ tablet Take 1 tablet (20 mEq total) by mouth daily. PT OVERDUE FOR OV PLEASE CALL FOR APPT (Patient taking differently: Take 20 mEq by mouth daily. ) 30 tablet 0  . rosuvastatin (CRESTOR) 20 MG tablet Take 20 mg by mouth daily.    . sacubitril-valsartan (ENTRESTO) 49-51 MG Take 1 tablet by mouth 2 (two) times daily. 60 tablet 5  . sertraline (ZOLOFT) 50 MG tablet TAKE 1 TABLET BY MOUTH EVERY DAY (Patient taking differently: Take 50 mg by mouth daily. ) 90 tablet 4  . sildenafil (VIAGRA) 100 MG tablet Take 0.5 tablets (50 mg total) by mouth at bedtime as needed for erectile dysfunction. 10 tablet 3  . spironolactone (ALDACTONE) 25 MG tablet TAKE 1 TABLET BY MOUTH EVERY DAY (Patient taking differently: Take 25 mg by mouth daily. ) 90 tablet 3  . traMADol (ULTRAM) 50 MG tablet Take 50 mg by mouth every 6 (six) hours as needed for moderate pain or severe pain.      Results for orders placed or performed during the hospital encounter of 10/19/19 (from the past 48 hour(s))  Glucose, capillary     Status: Abnormal   Collection Time: 10/19/19  5:49 AM  Result Value Ref Range   Glucose-Capillary 108 (H) 70 - 99 mg/dL   No results found.  Review of Systems  Blood pressure (!) 146/93, pulse 87, temperature 98.3 F (36.8 C), temperature source Oral, resp. rate 20, height 5\' 11"  (1.803 m), weight 113.4 kg, SpO2 99 %. Physical Exam   Assessment/Plan History of significant left shoulder fracture status post ORIF with subsequent hardware removal and continued pain.  He was noted to have some collapse and degeneration of the humeral head.  Indicated for revision to arthroplasty.  I feel that  reverse total shoulder has the highest chance of meaningful recovery of function. Risks / benefits of surgery discussed Consent on chart  NPO for OR Preop antibiotics   Isabella Stalling, MD 10/19/2019, 7:17 AM

## 2019-10-20 ENCOUNTER — Encounter: Payer: Self-pay | Admitting: *Deleted

## 2019-10-21 NOTE — Addendum Note (Signed)
Addendum  created 10/21/19 1205 by [name], MD   Clinical Note Signed, Intraprocedure Blocks edited

## 2019-11-01 ENCOUNTER — Other Ambulatory Visit (HOSPITAL_COMMUNITY): Payer: Self-pay | Admitting: Cardiology

## 2019-11-04 ENCOUNTER — Ambulatory Visit (INDEPENDENT_AMBULATORY_CARE_PROVIDER_SITE_OTHER): Payer: 59 | Admitting: *Deleted

## 2019-11-04 DIAGNOSIS — I428 Other cardiomyopathies: Secondary | ICD-10-CM

## 2019-11-04 LAB — CUP PACEART REMOTE DEVICE CHECK
Battery Remaining Longevity: 88 mo
Battery Remaining Percentage: 86 %
Battery Voltage: 3.04 V
Brady Statistic RV Percent Paced: 1 %
Date Time Interrogation Session: 20210218010633
HighPow Impedance: 64 Ohm
HighPow Impedance: 64 Ohm
Implantable Lead Implant Date: 20190821
Implantable Lead Location: 753860
Implantable Lead Model: 7122
Implantable Pulse Generator Implant Date: 20190821
Lead Channel Impedance Value: 410 Ohm
Lead Channel Pacing Threshold Amplitude: 0.75 V
Lead Channel Pacing Threshold Pulse Width: 0.5 ms
Lead Channel Sensing Intrinsic Amplitude: 11.8 mV
Lead Channel Setting Pacing Amplitude: 2.5 V
Lead Channel Setting Pacing Pulse Width: 0.5 ms
Lead Channel Setting Sensing Sensitivity: 0.5 mV
Pulse Gen Serial Number: 9824800

## 2019-11-04 NOTE — Progress Notes (Signed)
ICD Remote  

## 2019-11-05 ENCOUNTER — Other Ambulatory Visit: Payer: Self-pay | Admitting: Cardiovascular Disease

## 2019-11-05 ENCOUNTER — Other Ambulatory Visit (HOSPITAL_COMMUNITY): Payer: Self-pay | Admitting: Cardiology

## 2019-11-08 ENCOUNTER — Encounter: Payer: 59 | Admitting: Cardiovascular Disease

## 2019-11-10 ENCOUNTER — Other Ambulatory Visit: Payer: Self-pay | Admitting: Orthopedic Surgery

## 2019-11-10 ENCOUNTER — Encounter (HOSPITAL_COMMUNITY): Payer: Self-pay | Admitting: Orthopedic Surgery

## 2019-11-10 ENCOUNTER — Other Ambulatory Visit (HOSPITAL_COMMUNITY)
Admission: RE | Admit: 2019-11-10 | Discharge: 2019-11-10 | Disposition: A | Discharge status: Home / Self Care | Payer: 59 | Source: Ambulatory Visit | Attending: Orthopedic Surgery | Admitting: Orthopedic Surgery

## 2019-11-10 DIAGNOSIS — Z01812 Encounter for preprocedural laboratory examination: Secondary | ICD-10-CM | POA: Insufficient documentation

## 2019-11-10 DIAGNOSIS — Z20822 Contact with and (suspected) exposure to covid-19: Secondary | ICD-10-CM | POA: Diagnosis not present

## 2019-11-10 LAB — SARS CORONAVIRUS 2 (TAT 6-24 HRS): SARS Coronavirus 2: NEGATIVE

## 2019-11-10 NOTE — Progress Notes (Signed)
I called [phone] opt 1 to schedule John [name] to demagnetize pacemaker informed patient will arrive at 5:30 AM for 7:30 AM surgery.

## 2019-11-10 NOTE — Anesthesia Preprocedure Evaluation (Addendum)
Anesthesia Evaluation  Patient identified by MRN, date of birth, ID band Patient awake    Reviewed: Allergy & Precautions, NPO status , Patient's Chart, lab work & pertinent test results  History of Anesthesia Complications Negative for: history of anesthetic complications  Airway Mallampati: III  TM Distance: >3 FB Neck ROM: Full    Dental  (+) Dental Advisory Given   Pulmonary sleep apnea and Continuous Positive Airway Pressure Ventilation ,    Pulmonary exam normal        Cardiovascular hypertension, Pt. on medications and Pt. on home beta blockers +CHF  Normal cardiovascular exam+ dysrhythmias Atrial Fibrillation + Cardiac Defibrillator    Pt last seen by cardiologist, Dr. Loralie Champagne, 09/14/2019.  Per OV note, "He could have surgery after 1 month of uninterrupted anticoagulation post-DCCV.  Also needs to cut back on ETOH prior to surgery."  S/p DCCV 09/01/2019, has completed 1 month of uninterrupted anticoagulation.  '20 TTE - EF 25 to 30%. Mildly dilated left ventricular internal cavity size. The left ventricle demonstrates global hypokinesis.  Global right ventricle has mildly reduced systolic function. LA was moderately dilated. Trivial MR. Mild plaque in the descending thoracic aorta.     Neuro/Psych  Headaches, PSYCHIATRIC DISORDERS Anxiety Depression    GI/Hepatic negative GI ROS, (+)     substance abuse  alcohol use,   Endo/Other  neg diabetes Obesity   Renal/GU negative Renal ROS     Musculoskeletal  (+) Arthritis ,   Abdominal (+) + obese,   Peds  Hematology negative hematology ROS (+)   Anesthesia Other Findings Covid neg 11/10/19   Reproductive/Obstetrics                          Anesthesia Physical Anesthesia Plan  ASA: IV  Anesthesia Plan: General   Post-op Pain Management:    Induction: Intravenous  PONV Risk Score and Plan: 2 and Treatment may vary due to  age or medical condition, Ondansetron, Dexamethasone and Midazolam  Airway Management Planned: Oral ETT  Additional Equipment: None  Intra-op Plan:   Post-operative Plan: Extubation in OR  Informed Consent: I have reviewed the patients History and Physical, chart, labs and discussed the procedure including the risks, benefits and alternatives for the proposed anesthesia with the patient or authorized representative who has indicated his/her understanding and acceptance.     Dental advisory given  Plan Discussed with: CRNA and Anesthesiologist  Anesthesia Plan Comments: (Mac 4 grade 1 view with previous intubation )      Anesthesia Quick Evaluation

## 2019-11-11 ENCOUNTER — Encounter (HOSPITAL_COMMUNITY)
Admission: RE | Disposition: A | Discharge status: Home Health | Payer: Self-pay | Source: Home / Self Care | Attending: Orthopedic Surgery

## 2019-11-11 ENCOUNTER — Other Ambulatory Visit: Payer: Self-pay

## 2019-11-11 ENCOUNTER — Ambulatory Visit (HOSPITAL_COMMUNITY): Payer: 59 | Admitting: Certified Registered"

## 2019-11-11 ENCOUNTER — Observation Stay (HOSPITAL_COMMUNITY)
Admission: RE | Admit: 2019-11-11 | Discharge: 2019-11-13 | Disposition: A | Discharge status: Home Health | Payer: 59 | Attending: Orthopedic Surgery | Admitting: Orthopedic Surgery

## 2019-11-11 ENCOUNTER — Telehealth (HOSPITAL_COMMUNITY): Payer: Self-pay | Admitting: *Deleted

## 2019-11-11 ENCOUNTER — Encounter (HOSPITAL_COMMUNITY): Payer: Self-pay | Admitting: Orthopedic Surgery

## 2019-11-11 DIAGNOSIS — Z96612 Presence of left artificial shoulder joint: Secondary | ICD-10-CM | POA: Insufficient documentation

## 2019-11-11 DIAGNOSIS — I5042 Chronic combined systolic (congestive) and diastolic (congestive) heart failure: Secondary | ICD-10-CM | POA: Insufficient documentation

## 2019-11-11 DIAGNOSIS — G8929 Other chronic pain: Secondary | ICD-10-CM | POA: Insufficient documentation

## 2019-11-11 DIAGNOSIS — Z6834 Body mass index (BMI) 34.0-34.9, adult: Secondary | ICD-10-CM | POA: Insufficient documentation

## 2019-11-11 DIAGNOSIS — F419 Anxiety disorder, unspecified: Secondary | ICD-10-CM | POA: Insufficient documentation

## 2019-11-11 DIAGNOSIS — M1990 Unspecified osteoarthritis, unspecified site: Secondary | ICD-10-CM | POA: Insufficient documentation

## 2019-11-11 DIAGNOSIS — R519 Headache, unspecified: Secondary | ICD-10-CM | POA: Insufficient documentation

## 2019-11-11 DIAGNOSIS — Z8249 Family history of ischemic heart disease and other diseases of the circulatory system: Secondary | ICD-10-CM | POA: Insufficient documentation

## 2019-11-11 DIAGNOSIS — F329 Major depressive disorder, single episode, unspecified: Secondary | ICD-10-CM | POA: Diagnosis not present

## 2019-11-11 DIAGNOSIS — M9689 Other intraoperative and postprocedural complications and disorders of the musculoskeletal system: Secondary | ICD-10-CM | POA: Diagnosis not present

## 2019-11-11 DIAGNOSIS — T8459XA Infection and inflammatory reaction due to other internal joint prosthesis, initial encounter: Secondary | ICD-10-CM

## 2019-11-11 DIAGNOSIS — E669 Obesity, unspecified: Secondary | ICD-10-CM | POA: Diagnosis not present

## 2019-11-11 DIAGNOSIS — E119 Type 2 diabetes mellitus without complications: Secondary | ICD-10-CM | POA: Diagnosis not present

## 2019-11-11 DIAGNOSIS — Z7901 Long term (current) use of anticoagulants: Secondary | ICD-10-CM | POA: Diagnosis not present

## 2019-11-11 DIAGNOSIS — I4891 Unspecified atrial fibrillation: Secondary | ICD-10-CM | POA: Diagnosis not present

## 2019-11-11 DIAGNOSIS — Y838 Other surgical procedures as the cause of abnormal reaction of the patient, or of later complication, without mention of misadventure at the time of the procedure: Secondary | ICD-10-CM | POA: Insufficient documentation

## 2019-11-11 DIAGNOSIS — T8142XA Infection following a procedure, deep incisional surgical site, initial encounter: Secondary | ICD-10-CM | POA: Diagnosis not present

## 2019-11-11 DIAGNOSIS — Z79899 Other long term (current) drug therapy: Secondary | ICD-10-CM | POA: Diagnosis not present

## 2019-11-11 DIAGNOSIS — Z96642 Presence of left artificial hip joint: Secondary | ICD-10-CM | POA: Insufficient documentation

## 2019-11-11 DIAGNOSIS — Z96611 Presence of right artificial shoulder joint: Secondary | ICD-10-CM | POA: Diagnosis not present

## 2019-11-11 DIAGNOSIS — I428 Other cardiomyopathies: Secondary | ICD-10-CM

## 2019-11-11 DIAGNOSIS — Z8781 Personal history of (healed) traumatic fracture: Secondary | ICD-10-CM

## 2019-11-11 DIAGNOSIS — Z7984 Long term (current) use of oral hypoglycemic drugs: Secondary | ICD-10-CM | POA: Diagnosis not present

## 2019-11-11 DIAGNOSIS — Z95810 Presence of automatic (implantable) cardiac defibrillator: Secondary | ICD-10-CM | POA: Insufficient documentation

## 2019-11-11 DIAGNOSIS — M009 Pyogenic arthritis, unspecified: Secondary | ICD-10-CM | POA: Insufficient documentation

## 2019-11-11 DIAGNOSIS — I110 Hypertensive heart disease with heart failure: Secondary | ICD-10-CM | POA: Diagnosis not present

## 2019-11-11 DIAGNOSIS — M25552 Pain in left hip: Secondary | ICD-10-CM | POA: Diagnosis not present

## 2019-11-11 DIAGNOSIS — G4730 Sleep apnea, unspecified: Secondary | ICD-10-CM | POA: Insufficient documentation

## 2019-11-11 HISTORY — PX: IRRIGATION AND DEBRIDEMENT SHOULDER: SHX5880

## 2019-11-11 LAB — GLUCOSE, CAPILLARY: Glucose-Capillary: 113 mg/dL — ABNORMAL HIGH (ref 70–99)

## 2019-11-11 LAB — BASIC METABOLIC PANEL
Anion gap: 10 (ref 5–15)
BUN: 14 mg/dL (ref 6–20)
CO2: 22 mmol/L (ref 22–32)
Calcium: 9.4 mg/dL (ref 8.9–10.3)
Chloride: 104 mmol/L (ref 98–111)
Creatinine, Ser: 0.95 mg/dL (ref 0.61–1.24)
GFR calc Af Amer: >60 mL/min (ref >60)
GFR calc non Af Amer: >60 mL/min (ref >60)
Glucose, Bld: 123 mg/dL — ABNORMAL HIGH (ref 70–99)
Potassium: 4.2 mmol/L (ref 3.5–5.1)
Sodium: 136 mmol/L (ref 135–145)

## 2019-11-11 LAB — SEDIMENTATION RATE: Sed Rate: 41 mm/hr — ABNORMAL HIGH (ref 0–16)

## 2019-11-11 LAB — C-REACTIVE PROTEIN: CRP: 1.6 mg/dL — ABNORMAL HIGH (ref <1.0)

## 2019-11-11 LAB — CBC
HCT: 35.9 % — ABNORMAL LOW (ref 39.0–52.0)
Hemoglobin: 11.5 g/dL — ABNORMAL LOW (ref 13.0–17.0)
MCH: 32.5 pg (ref 26.0–34.0)
MCHC: 32 g/dL (ref 30.0–36.0)
MCV: 101.4 fL — ABNORMAL HIGH (ref 80.0–100.0)
Platelets: 293 10*3/uL (ref 150–400)
RBC: 3.54 MIL/uL — ABNORMAL LOW (ref 4.22–5.81)
RDW: 12.5 % (ref 11.5–15.5)
WBC: 6.5 10*3/uL (ref 4.0–10.5)
nRBC: 0 % (ref 0.0–0.2)

## 2019-11-11 SURGERY — IRRIGATION AND DEBRIDEMENT SHOULDER
Anesthesia: General | Site: Shoulder | Laterality: Left

## 2019-11-11 MED ORDER — ONDANSETRON HCL 4 MG/2ML IJ SOLN
INTRAMUSCULAR | Status: AC
  Filled 2019-11-11: qty 2

## 2019-11-11 MED ORDER — DEXAMETHASONE SODIUM PHOSPHATE 10 MG/ML IJ SOLN
INTRAMUSCULAR | Status: DC | PRN
  Administered 2019-11-11: 8 mg via INTRAVENOUS

## 2019-11-11 MED ORDER — PROPOFOL 10 MG/ML IV BOLUS
INTRAVENOUS | Status: DC | PRN
  Administered 2019-11-11: 100 mg via INTRAVENOUS

## 2019-11-11 MED ORDER — ALUM & MAG HYDROXIDE-SIMETH 200-200-20 MG/5ML PO SUSP: 30.0000 mL | ORAL | Status: DC | PRN

## 2019-11-11 MED ORDER — SUGAMMADEX SODIUM 200 MG/2ML IV SOLN
INTRAVENOUS | Status: DC | PRN
  Administered 2019-11-11: 300 mg via INTRAVENOUS

## 2019-11-11 MED ORDER — OXYCODONE HCL 5 MG/5ML PO SOLN: 5.0000 mg | Freq: Once | ORAL | Status: DC | PRN

## 2019-11-11 MED ORDER — ROCURONIUM BROMIDE 10 MG/ML (PF) SYRINGE
PREFILLED_SYRINGE | INTRAVENOUS | Status: DC | PRN
  Administered 2019-11-11 (×2): 20 mg via INTRAVENOUS
  Administered 2019-11-11: 60 mg via INTRAVENOUS

## 2019-11-11 MED ORDER — METHOCARBAMOL 500 MG IVPB - SIMPLE MED
500.0000 mg | Freq: Four times a day (QID) | INTRAVENOUS | Status: DC | PRN
  Administered 2019-11-11: 500 mg via INTRAVENOUS
  Filled 2019-11-11: qty 50

## 2019-11-11 MED ORDER — MIDAZOLAM HCL 2 MG/2ML IJ SOLN
INTRAMUSCULAR | Status: DC | PRN
  Administered 2019-11-11 (×2): 1 mg via INTRAVENOUS

## 2019-11-11 MED ORDER — METHOCARBAMOL 500 MG IVPB - SIMPLE MED
INTRAVENOUS | Status: AC
  Filled 2019-11-11: qty 50

## 2019-11-11 MED ORDER — METOCLOPRAMIDE HCL 5 MG/ML IJ SOLN: 5.0000 mg | Freq: Three times a day (TID) | INTRAMUSCULAR | Status: DC | PRN

## 2019-11-11 MED ORDER — OXYCODONE HCL 5 MG PO TABS: 5.0000 mg | ORAL_TABLET | Freq: Once | ORAL | Status: DC | PRN

## 2019-11-11 MED ORDER — FENTANYL CITRATE (PF) 100 MCG/2ML IJ SOLN
INTRAMUSCULAR | Status: AC
  Filled 2019-11-11: qty 2

## 2019-11-11 MED ORDER — VANCOMYCIN HCL IN DEXTROSE 1-5 GM/200ML-% IV SOLN: 1000.0000 mg | Freq: Two times a day (BID) | INTRAVENOUS | Status: DC

## 2019-11-11 MED ORDER — DIPHENHYDRAMINE HCL 12.5 MG/5ML PO ELIX: 12.5000 mg | ORAL_SOLUTION | ORAL | Status: DC | PRN

## 2019-11-11 MED ORDER — HYDROMORPHONE HCL 1 MG/ML IJ SOLN
0.5000 mg | INTRAMUSCULAR | Status: DC | PRN
  Administered 2019-11-11 (×2): 0.5 mg via INTRAVENOUS

## 2019-11-11 MED ORDER — METOCLOPRAMIDE HCL 5 MG PO TABS: 5.0000 mg | ORAL_TABLET | Freq: Three times a day (TID) | ORAL | Status: DC | PRN

## 2019-11-11 MED ORDER — SERTRALINE HCL 50 MG PO TABS
50.0000 mg | ORAL_TABLET | Freq: Every day | ORAL | Status: DC
  Administered 2019-11-11 – 2019-11-13 (×3): 50 mg via ORAL
  Filled 2019-11-11 (×3): qty 1

## 2019-11-11 MED ORDER — SUGAMMADEX SODIUM 500 MG/5ML IV SOLN
INTRAVENOUS | Status: AC
  Filled 2019-11-11: qty 5

## 2019-11-11 MED ORDER — FLEET ENEMA 7-19 GM/118ML RE ENEM: 1.0000 | ENEMA | Freq: Once | RECTAL | Status: DC | PRN

## 2019-11-11 MED ORDER — PHENYLEPHRINE HCL-NACL 10-0.9 MG/250ML-% IV SOLN
INTRAVENOUS | Status: DC | PRN
  Administered 2019-11-11: 20 ug/min via INTRAVENOUS

## 2019-11-11 MED ORDER — FUROSEMIDE 40 MG PO TABS
40.0000 mg | ORAL_TABLET | Freq: Every day | ORAL | Status: DC
  Administered 2019-11-11 – 2019-11-13 (×3): 40 mg via ORAL
  Filled 2019-11-11 (×3): qty 1

## 2019-11-11 MED ORDER — CHLORHEXIDINE GLUCONATE 4 % EX LIQD: 60.0000 mL | Freq: Once | CUTANEOUS | Status: DC

## 2019-11-11 MED ORDER — SODIUM CHLORIDE 0.9 % IR SOLN
Status: DC | PRN
  Administered 2019-11-11: 1000 mL

## 2019-11-11 MED ORDER — VANCOMYCIN HCL IN DEXTROSE 1-5 GM/200ML-% IV SOLN
INTRAVENOUS | Status: AC
  Filled 2019-11-11: qty 200

## 2019-11-11 MED ORDER — HYDROMORPHONE HCL 1 MG/ML IJ SOLN
0.5000 mg | INTRAMUSCULAR | Status: DC | PRN
  Administered 2019-11-11 (×2): 1 mg via INTRAVENOUS
  Administered 2019-11-12: 21:00:00 0.5 mg via INTRAVENOUS
  Filled 2019-11-11 (×3): qty 1

## 2019-11-11 MED ORDER — HYDROMORPHONE HCL 1 MG/ML IJ SOLN
INTRAMUSCULAR | Status: AC
  Filled 2019-11-11: qty 1

## 2019-11-11 MED ORDER — MIDAZOLAM HCL 2 MG/2ML IJ SOLN
INTRAMUSCULAR | Status: AC
  Filled 2019-11-11: qty 2

## 2019-11-11 MED ORDER — DEXAMETHASONE SODIUM PHOSPHATE 10 MG/ML IJ SOLN
INTRAMUSCULAR | Status: AC
  Filled 2019-11-11: qty 1

## 2019-11-11 MED ORDER — VANCOMYCIN HCL 1250 MG/250ML IV SOLN
1250.0000 mg | Freq: Two times a day (BID) | INTRAVENOUS | Status: DC
  Administered 2019-11-11 – 2019-11-13 (×5): 1250 mg via INTRAVENOUS
  Filled 2019-11-11 (×5): qty 250

## 2019-11-11 MED ORDER — BISACODYL 5 MG PO TBEC: 5.0000 mg | DELAYED_RELEASE_TABLET | Freq: Every day | ORAL | Status: DC | PRN

## 2019-11-11 MED ORDER — PHENYLEPHRINE HCL (PRESSORS) 10 MG/ML IV SOLN
INTRAVENOUS | Status: AC
  Filled 2019-11-11: qty 1

## 2019-11-11 MED ORDER — LACTATED RINGERS IV SOLN
INTRAVENOUS | Status: DC
  Administered 2019-11-11: 1000 mL via INTRAVENOUS

## 2019-11-11 MED ORDER — DOCUSATE SODIUM 100 MG PO CAPS
100.0000 mg | ORAL_CAPSULE | Freq: Two times a day (BID) | ORAL | Status: DC
  Administered 2019-11-11 – 2019-11-13 (×4): 100 mg via ORAL
  Filled 2019-11-11 (×4): qty 1

## 2019-11-11 MED ORDER — MENTHOL 3 MG MT LOZG: 1.0000 | LOZENGE | OROMUCOSAL | Status: DC | PRN

## 2019-11-11 MED ORDER — SODIUM CHLORIDE 0.9 % IV SOLN: INTRAVENOUS | Status: DC

## 2019-11-11 MED ORDER — OXYCODONE HCL 5 MG PO TABS
5.0000 mg | ORAL_TABLET | ORAL | Status: DC | PRN
  Administered 2019-11-11: 11:00:00 10 mg via ORAL
  Filled 2019-11-11: qty 2
  Filled 2019-11-11: qty 1
  Filled 2019-11-11 (×3): qty 2

## 2019-11-11 MED ORDER — LIDOCAINE 2% (20 MG/ML) 5 ML SYRINGE
INTRAMUSCULAR | Status: DC | PRN
  Administered 2019-11-11: 60 mg via INTRAVENOUS

## 2019-11-11 MED ORDER — PROPOFOL 10 MG/ML IV BOLUS
INTRAVENOUS | Status: AC
  Filled 2019-11-11: qty 40

## 2019-11-11 MED ORDER — CARVEDILOL 12.5 MG PO TABS
12.5000 mg | ORAL_TABLET | Freq: Two times a day (BID) | ORAL | Status: DC
  Administered 2019-11-11 – 2019-11-13 (×4): 12.5 mg via ORAL
  Filled 2019-11-11 (×5): qty 1

## 2019-11-11 MED ORDER — ONDANSETRON HCL 4 MG/2ML IJ SOLN: 4.0000 mg | Freq: Once | INTRAMUSCULAR | Status: DC | PRN

## 2019-11-11 MED ORDER — CANAGLIFLOZIN 100 MG PO TABS
100.0000 mg | ORAL_TABLET | Freq: Every day | ORAL | Status: DC
  Administered 2019-11-12: 10:00:00 100 mg via ORAL
  Filled 2019-11-11 (×2): qty 1

## 2019-11-11 MED ORDER — FENTANYL CITRATE (PF) 100 MCG/2ML IJ SOLN
25.0000 ug | INTRAMUSCULAR | Status: DC | PRN
  Administered 2019-11-11 (×3): 50 ug via INTRAVENOUS

## 2019-11-11 MED ORDER — ROCURONIUM BROMIDE 10 MG/ML (PF) SYRINGE
PREFILLED_SYRINGE | INTRAVENOUS | Status: AC
  Filled 2019-11-11: qty 10

## 2019-11-11 MED ORDER — LORAZEPAM 0.5 MG PO TABS
0.5000 mg | ORAL_TABLET | Freq: Three times a day (TID) | ORAL | Status: DC | PRN
  Administered 2019-11-12 – 2019-11-13 (×4): 1 mg via ORAL
  Filled 2019-11-11 (×4): qty 2

## 2019-11-11 MED ORDER — POLYETHYLENE GLYCOL 3350 17 G PO PACK: 17.0000 g | PACK | Freq: Every day | ORAL | Status: DC | PRN

## 2019-11-11 MED ORDER — CARVEDILOL 12.5 MG PO TABS
12.5000 mg | ORAL_TABLET | ORAL | Status: AC
  Administered 2019-11-11: 12.5 mg via ORAL
  Filled 2019-11-11 (×2): qty 1

## 2019-11-11 MED ORDER — ONDANSETRON HCL 4 MG PO TABS: 4.0000 mg | ORAL_TABLET | Freq: Four times a day (QID) | ORAL | Status: DC | PRN

## 2019-11-11 MED ORDER — LIDOCAINE 2% (20 MG/ML) 5 ML SYRINGE
INTRAMUSCULAR | Status: AC
  Filled 2019-11-11: qty 5

## 2019-11-11 MED ORDER — FENTANYL CITRATE (PF) 250 MCG/5ML IJ SOLN
INTRAMUSCULAR | Status: DC | PRN
  Administered 2019-11-11: 25 ug via INTRAVENOUS
  Administered 2019-11-11: 50 ug via INTRAVENOUS
  Administered 2019-11-11: 25 ug via INTRAVENOUS
  Administered 2019-11-11: 50 ug via INTRAVENOUS
  Administered 2019-11-11 (×2): 25 ug via INTRAVENOUS

## 2019-11-11 MED ORDER — SACUBITRIL-VALSARTAN 49-51 MG PO TABS
1.0000 | ORAL_TABLET | Freq: Two times a day (BID) | ORAL | Status: DC
  Administered 2019-11-11 – 2019-11-13 (×4): 1 via ORAL
  Filled 2019-11-11 (×5): qty 1

## 2019-11-11 MED ORDER — PHENOL 1.4 % MT LIQD: 1.0000 | OROMUCOSAL | Status: DC | PRN

## 2019-11-11 MED ORDER — SODIUM CHLORIDE 0.9 % IV SOLN
2.0000 g | INTRAVENOUS | Status: DC
  Administered 2019-11-11 – 2019-11-13 (×3): 2 g via INTRAVENOUS
  Filled 2019-11-11 (×3): qty 2

## 2019-11-11 MED ORDER — ACETAMINOPHEN 325 MG PO TABS
325.0000 mg | ORAL_TABLET | Freq: Four times a day (QID) | ORAL | Status: DC | PRN
  Administered 2019-11-12: 650 mg via ORAL
  Filled 2019-11-11: qty 2

## 2019-11-11 MED ORDER — METHOCARBAMOL 500 MG PO TABS
500.0000 mg | ORAL_TABLET | Freq: Four times a day (QID) | ORAL | Status: DC | PRN
  Administered 2019-11-11 – 2019-11-13 (×4): 500 mg via ORAL
  Filled 2019-11-11 (×4): qty 1

## 2019-11-11 MED ORDER — ZOLPIDEM TARTRATE 5 MG PO TABS: 5.0000 mg | ORAL_TABLET | Freq: Every evening | ORAL | Status: DC | PRN

## 2019-11-11 MED ORDER — SPIRONOLACTONE 25 MG PO TABS
25.0000 mg | ORAL_TABLET | Freq: Every day | ORAL | Status: DC
  Administered 2019-11-11 – 2019-11-13 (×3): 25 mg via ORAL
  Filled 2019-11-11 (×3): qty 1

## 2019-11-11 MED ORDER — PHENYLEPHRINE 40 MCG/ML (10ML) SYRINGE FOR IV PUSH (FOR BLOOD PRESSURE SUPPORT)
PREFILLED_SYRINGE | INTRAVENOUS | Status: DC | PRN
  Administered 2019-11-11: 120 ug via INTRAVENOUS

## 2019-11-11 MED ORDER — ONDANSETRON HCL 4 MG/2ML IJ SOLN: 4.0000 mg | Freq: Four times a day (QID) | INTRAMUSCULAR | Status: DC | PRN

## 2019-11-11 MED ORDER — OXYCODONE HCL 5 MG PO TABS
10.0000 mg | ORAL_TABLET | ORAL | Status: DC | PRN
  Administered 2019-11-11: 10 mg via ORAL
  Administered 2019-11-11 – 2019-11-12 (×2): 15 mg via ORAL
  Administered 2019-11-12: 10 mg via ORAL
  Administered 2019-11-12 – 2019-11-13 (×5): 15 mg via ORAL
  Filled 2019-11-11 (×6): qty 3

## 2019-11-11 MED ORDER — VANCOMYCIN HCL 1000 MG IV SOLR
INTRAVENOUS | Status: DC | PRN
  Administered 2019-11-11: 1000 mg via INTRAVENOUS

## 2019-11-11 MED ORDER — ONDANSETRON HCL 4 MG/2ML IJ SOLN
INTRAMUSCULAR | Status: DC | PRN
  Administered 2019-11-11: 4 mg via INTRAVENOUS

## 2019-11-11 SURGICAL SUPPLY — 51 items
BIT DRILL 2.5X110 QC LCP DISP (BIT) ×2 IMPLANT
BOOTIES KNEE HIGH SLOAN (MISCELLANEOUS) ×4 IMPLANT
CHLORAPREP W/TINT 26 (MISCELLANEOUS) ×2 IMPLANT
COVER SURGICAL LIGHT HANDLE (MISCELLANEOUS) ×2 IMPLANT
COVER WAND RF STERILE (DRAPES) ×2 IMPLANT
DRAPE U-SHAPE 47X51 STRL (DRAPES) ×2 IMPLANT
DRSG MEPILEX BORDER 4X8 (GAUZE/BANDAGES/DRESSINGS) IMPLANT
DRSG PAD ABDOMINAL 8X10 ST (GAUZE/BANDAGES/DRESSINGS) ×2 IMPLANT
ELECT REM PT RETURN 15FT ADLT (MISCELLANEOUS) IMPLANT
EVACUATOR 1/8 PVC DRAIN (DRAIN) ×2 IMPLANT
GAUZE SPONGE 4X4 12PLY STRL (GAUZE/BANDAGES/DRESSINGS) ×2 IMPLANT
GAUZE XEROFORM 1X8 LF (GAUZE/BANDAGES/DRESSINGS) ×2 IMPLANT
GLOVE BIO SURGEON STRL SZ7 (GLOVE) ×2 IMPLANT
GLOVE BIOGEL PI IND STRL 8 (GLOVE) ×1 IMPLANT
GLOVE BIOGEL PI INDICATOR 8 (GLOVE) ×1
GLOVE ORTHO TXT STRL SZ7.5 (GLOVE) ×2 IMPLANT
GOWN STRL REUS W/ TWL LRG LVL3 (GOWN DISPOSABLE) ×1 IMPLANT
GOWN STRL REUS W/ TWL XL LVL3 (GOWN DISPOSABLE) ×1 IMPLANT
GOWN STRL REUS W/TWL LRG LVL3 (GOWN DISPOSABLE) ×1
GOWN STRL REUS W/TWL XL LVL3 (GOWN DISPOSABLE) ×1
HANDPIECE INTERPULSE COAX TIP (DISPOSABLE)
HOOD PEEL AWAY FLYTE STAYCOOL (MISCELLANEOUS) ×4 IMPLANT
INSERT EPOLY STND HUMERUS+4 32 (Shoulder) ×2 IMPLANT
INSERT EPOLYSTD HUMERUS+4 32 (Shoulder) ×1 IMPLANT
KIT BASIN OR (CUSTOM PROCEDURE TRAY) ×2 IMPLANT
KIT TURNOVER KIT A (KITS) ×2 IMPLANT
MANIFOLD NEPTUNE II (INSTRUMENTS) ×2 IMPLANT
NS IRRIG 1000ML POUR BTL (IV SOLUTION) ×2 IMPLANT
PACK SHOULDER (CUSTOM PROCEDURE TRAY) ×2 IMPLANT
PAD ARMBOARD 7.5X6 YLW CONV (MISCELLANEOUS) ×4 IMPLANT
PENCIL SMOKE EVACUATOR (MISCELLANEOUS) IMPLANT
SCREW CANN 30MM FULLY (Screw) IMPLANT
SET HNDPC FAN SPRY TIP SCT (DISPOSABLE) IMPLANT
SLING ARM FOAM STRAP XLG (SOFTGOODS) ×2 IMPLANT
SPONGE LAP 18X18 RF (DISPOSABLE) ×2 IMPLANT
SUPPORT WRAP ARM LG (MISCELLANEOUS) IMPLANT
SUT ETHILON 3 0 PS 1 (SUTURE) IMPLANT
SUT PROLENE 2 0 CT2 30 (SUTURE) ×6 IMPLANT
SUT VIC AB 0 CT1 27 (SUTURE)
SUT VIC AB 0 CT1 27XBRD ANBCTR (SUTURE) IMPLANT
SUT VIC AB 2-0 CT1 27 (SUTURE)
SUT VIC AB 2-0 CT1 TAPERPNT 27 (SUTURE) IMPLANT
SWAB COLLECTION DEVICE MRSA (MISCELLANEOUS) ×2 IMPLANT
SWAB CULTURE ESWAB REG 1ML (MISCELLANEOUS) IMPLANT
TAPE PAPER 3X10 WHT MICROPORE (GAUZE/BANDAGES/DRESSINGS) ×2 IMPLANT
TOWEL OR 17X26 10 PK STRL BLUE (TOWEL DISPOSABLE) ×2 IMPLANT
TOWEL OR NON WOVEN STRL DISP B (DISPOSABLE) ×2 IMPLANT
TUBING CONNECTING 10 (TUBING) ×2 IMPLANT
UNDERPAD 30X36 HEAVY ABSORB (UNDERPADS AND DIAPERS) ×2 IMPLANT
WATER STERILE IRR 1000ML POUR (IV SOLUTION) ×2 IMPLANT
YANKAUER SUCT BULB TIP NO VENT (SUCTIONS) ×2 IMPLANT

## 2019-11-11 NOTE — Anesthesia Procedure Notes (Signed)
Procedure Name: Intubation Date/Time: 11/11/2019 7:43 AM Performed by: Eben Burow, CRNA Pre-anesthesia Checklist: Patient identified, Emergency Drugs available, Suction available, Patient being monitored and Timeout performed Patient Re-evaluated:Patient Re-evaluated prior to induction Oxygen Delivery Method: Circle system utilized Preoxygenation: Pre-oxygenation with 100% oxygen Induction Type: IV induction Ventilation: Mask ventilation without difficulty Laryngoscope Size: Mac and 4 Grade View: Grade I Tube type: Oral Tube size: 7.5 mm Number of attempts: 1 Airway Equipment and Method: Stylet Placement Confirmation: ETT inserted through vocal cords under direct vision,  positive ETCO2 and breath sounds checked- equal and bilateral Secured at: 22 cm Tube secured with: Tape Dental Injury: Teeth and Oropharynx as per pre-operative assessment

## 2019-11-11 NOTE — Discharge Instructions (Signed)

## 2019-11-11 NOTE — Transfer of Care (Signed)
Immediate Anesthesia Transfer of Care Note  Patient: John [name]  Procedure(s) Performed: LEFT SHOULDER IRRIGATION AND DEBRIDEMENT WITH POLY EXCHANGE (Left Shoulder)  Patient Location: PACU  Anesthesia Type:General  Level of Consciousness: awake, alert  and oriented  Airway & Oxygen Therapy: Patient Spontanous Breathing and Patient connected to face mask oxygen  Post-op Assessment: Report given to RN and Post -op Vital signs reviewed and stable  Post vital signs: Reviewed and stable  Last Vitals:  Vitals Value Taken Time  BP 141/82 11/11/19 0900  Temp 37 C 11/11/19 0900  Pulse 72 11/11/19 0900  Resp 16 11/11/19 0900  SpO2 100 % 11/11/19 0900    Last Pain:  Vitals:   11/11/19 0635  TempSrc:   PainSc: 0-No pain         Complications: No apparent anesthesia complications

## 2019-11-11 NOTE — Progress Notes (Signed)
Pharmacy Antibiotic Note  John [name] is a 59 y.o. male admitted on 11/11/2019 with PJI of shoulder .  Pharmacy has been consulted for vancomycin dosing.  Pt diagnosed with deep infection of shoulder after multiple shoulder surgeries. Cultures obtained prior antibiotic given in OR.  Pt to have PICC line place with long term abx.    Plan:  Vancomycin 1000 mg IV given pre-op, then vancomycin 1250 mg IV q12h    Ceftriaxone 2 gr IV q24h   Monitor clinical course, renal function, cultures as available   Height: 5\' 11"  (180.3 cm) Weight: 250 lb (113.4 kg) IBW/kg (Calculated) : 75.3  Temp (24hrs), Avg:98.9 F (37.2 C), Min:98.6 F (37 C), Max:99.1 F (37.3 C)  Recent Labs  Lab 11/11/19 0628  WBC 6.5  CREATININE 0.95    Estimated Creatinine Clearance: 108.5 mL/min (by C-G formula based on SCr of 0.95 mg/dL).    No Known Allergies  Antimicrobials this admission: 2/25 vancomycin >>  2/25 ceftriaxone >>   Dose adjustments this admission:   Microbiology results: 2/25  Deep left shoulder Cx:  2/24 COVID-19: Negative    Thank you for allowing pharmacy to be a part of this patient's care.  3/24, PharmD, BCPS 11/11/2019 10:34 AM

## 2019-11-11 NOTE — Anesthesia Postprocedure Evaluation (Signed)
Anesthesia Post Note  Patient: John [name]  Procedure(s) Performed: LEFT SHOULDER IRRIGATION AND DEBRIDEMENT WITH POLY EXCHANGE (Left Shoulder)     Patient location during evaluation: PACU Anesthesia Type: General Level of consciousness: awake and alert Pain management: pain level controlled Vital Signs Assessment: post-procedure vital signs reviewed and stable Respiratory status: spontaneous breathing, nonlabored ventilation and respiratory function stable Cardiovascular status: blood pressure returned to baseline and stable Postop Assessment: no apparent nausea or vomiting Anesthetic complications: no    Last Vitals:  Vitals:   11/11/19 1030 11/11/19 1054  BP: 121/68 138/74  Pulse: 62 67  Resp: 13   Temp: 36.9 C 36.7 C  SpO2: 99% 98%    Last Pain:  Vitals:   11/11/19 1030  TempSrc:   PainSc: 4                  Beryle Lathe

## 2019-11-11 NOTE — H&P (Signed)
John [name] is an 59 y.o. male.   Chief Complaint: L shoulder pain and swelling  HPI: 3wks s/p L reverse TSA with increased pain, swelling, redness.  C/o deep infection with history of multiple previous surgeries.  Past Medical History:  Diagnosis Date  . Anxiety    Ativan  . Chronic combined systolic and diastolic CHF, NYHA class 3 ([place]) 10/2016   Nonischemic cardiomyopathy. EF 20-25%.  . Chronic left hip pain   . Depression    history of  . Diabetes mellitus without complication ([place])    denies  . Dysrhythmia    A-FIB  . GI bleed 03/2016  . Headache   . Hypertensive heart disease with combined systolic and diastolic congestive heart failure ([place]) 10/2016  . Nonischemic cardiomyopathy ([place]) 10/2016   Echo with EF 20-25%. Cardiac catheterization with no CAD. LVEDP was 41 mmHg, PCWP 36 mmHg  . Osteoarthritis    right shoulder  . Right hand fracture   . Seasonal allergies   . Sleep apnea    wears a CPAP  . Wears glasses     Past Surgical History:  Procedure Laterality Date  . CARDIAC CATHETERIZATION    . CARDIOVERSION N/A 09/01/2019   Procedure: CARDIOVERSION;  Surgeon: Larey Dresser, MD;  Location: [facility] ENDOSCOPY;  Service: Cardiovascular;  Laterality: N/A;  . ESOPHAGOGASTRODUODENOSCOPY (EGD) WITH PROPOFOL N/A 03/20/2016   Procedure: ESOPHAGOGASTRODUODENOSCOPY (EGD) WITH PROPOFOL;  Surgeon: Wilford Corner, MD;  Location: [facility] ENDOSCOPY;  Service: Endoscopy;  Laterality: N/A;  . ICD IMPLANT N/A 05/06/2018   Procedure: ICD IMPLANT;  Surgeon: Sanda Klein, MD;  Location: [place] CV LAB;  Service: Cardiovascular;  Laterality: N/A;  . RIGHT/LEFT HEART CATH AND CORONARY ANGIOGRAPHY N/A 10/21/2016   Procedure: Right/Left Heart Cath and Coronary Angiography;  Surgeon: Lorretta Harp, MD;  Location: [place] CV LAB;  Service: Cardiovascular: Angiographically normal coronary arteries. PCWP 33-36 mmHg, LVEDP 41 mmHg. PA pressure 60/35, mean 46 mmHg.  Cardiac output/cardiac  index-3.93 /1.76 (Fick), 3.49/1.57 (thermodilution)  . TEE WITHOUT CARDIOVERSION N/A 09/01/2019   Procedure: TRANSESOPHAGEAL ECHOCARDIOGRAM (TEE);  Surgeon: Larey Dresser, MD;  Location: [facility] ENDOSCOPY;  Service: Cardiovascular;  Laterality: N/A;  . TOTAL HIP ARTHROPLASTY Left 03/27/2015   Procedure: LEFT TOTAL HIP ARTHROPLASTY ANTERIOR APPROACH;  Surgeon: Rod Can, MD;  Location: [place];  Service: Orthopedics;  Laterality: Left;  . TOTAL SHOULDER ARTHROPLASTY Right 11/24/2015   Procedure: RIGHT TOTAL SHOULDER ARTHROPLASTY;  Surgeon: Netta Cedars, MD;  Location: [place];  Service: Orthopedics;  Laterality: Right;  . TOTAL SHOULDER ARTHROPLASTY Left 10/19/2019   Procedure: REVERSE TOTAL SHOULDER ARTHROPLASTY;  Surgeon: Tania Ade, MD;  Location: [facility] ORS;  Service: Orthopedics;  Laterality: Left;  . TRANSTHORACIC ECHOCARDIOGRAM  10/2016   EF 20-25%. Diffuse hypokinesis but akinesis of the entire inferoseptal wall and apical wall. Moderate biatrial enlargement. PA pressure estimated 64 mmHg.  . WISDOM TOOTH EXTRACTION      Family History  Problem Relation Age of Onset  . Congenital heart disease Sister    Social History:  reports that he has never smoked. He has never used smokeless tobacco. He reports current alcohol use of about 3.0 standard drinks of alcohol per week. He reports that he does not use drugs.  Allergies: No Known Allergies  Medications Prior to Admission  Medication Sig Dispense Refill  . acetaminophen (TYLENOL) 325 MG tablet Take 650 mg by mouth every 6 (six) hours as needed for mild pain.    Marland Kitchen apixaban (ELIQUIS) 5 MG  TABS tablet Take 1 tablet (5 mg total) by mouth 2 (two) times daily. 60 tablet 6  . carvedilol (COREG) 12.5 MG tablet Take 1 tablet (12.5 mg total) by mouth 2 (two) times daily with a meal. 60 tablet 6  . dapagliflozin propanediol (FARXIGA) 10 MG TABS tablet Take 10 mg by mouth daily before breakfast. (Patient taking differently: Take 10 mg by mouth daily.  ) 30 tablet 11  . furosemide (LASIX) 40 MG tablet Take 1 tablet (40 mg total) by mouth daily. 30 tablet 5  . LORazepam (ATIVAN) 1 MG tablet Take 0.5-1 mg by mouth every 8 (eight) hours as needed for anxiety.    . Multiple Vitamins-Minerals (MULTIVITAMIN WITH MINERALS) tablet Take 1 tablet by mouth daily.    . potassium chloride SA (KLOR-CON M20) 20 MEQ tablet Take 1 tablet (20 mEq total) by mouth daily. Take 1 tablet by mouth daily. 90 tablet 3  . rosuvastatin (CRESTOR) 20 MG tablet TAKE 1 TABLET BY MOUTH EVERY DAY 90 tablet 3  . sacubitril-valsartan (ENTRESTO) 49-51 MG Take 1 tablet by mouth 2 (two) times daily. 60 tablet 5  . sertraline (ZOLOFT) 50 MG tablet TAKE 1 TABLET BY MOUTH EVERY DAY 90 tablet 0  . spironolactone (ALDACTONE) 25 MG tablet TAKE 1 TABLET BY MOUTH EVERY DAY (Patient taking differently: Take 25 mg by mouth daily. ) 90 tablet 3  . oxyCODONE-acetaminophen (PERCOCET) 5-325 MG tablet Take 1-2 tablets every 4 hours as needed for post operative pain. MAX 6/day (Patient not taking: Reported on 11/10/2019) 30 tablet 0  . sildenafil (VIAGRA) 100 MG tablet Take 0.5 tablets (50 mg total) by mouth at bedtime as needed for erectile dysfunction. 10 tablet 3  . tiZANidine (ZANAFLEX) 4 MG tablet Take 1 tablet (4 mg total) by mouth every 8 (eight) hours as needed for muscle spasms. (Patient not taking: Reported on 11/10/2019) 30 tablet 1    Results for orders placed or performed during the hospital encounter of 11/11/19 (from the past 48 hour(s))  Basic metabolic panel     Status: Abnormal   Collection Time: 11/11/19  6:28 AM  Result Value Ref Range   Sodium 136 135 - 145 mmol/L   Potassium 4.2 3.5 - 5.1 mmol/L   Chloride 104 98 - 111 mmol/L   CO2 22 22 - 32 mmol/L   Glucose, Bld 123 (H) 70 - 99 mg/dL    Comment: Glucose reference range applies only to samples taken after fasting for at least 8 hours.   BUN 14 6 - 20 mg/dL   Creatinine, Ser 7.84 0.61 - 1.24 mg/dL   Calcium 9.4 8.9 - 69.6  mg/dL   GFR calc non Af Amer >60 >60 mL/min   GFR calc Af Amer >60 >60 mL/min   Anion gap 10 5 - 15    Comment: Performed at [facility], [address] [place], [place] [address]  CBC     Status: Abnormal   Collection Time: 11/11/19  6:28 AM  Result Value Ref Range   WBC 6.5 4.0 - 10.5 K/uL   RBC 3.54 (L) 4.22 - 5.81 MIL/uL   Hemoglobin 11.5 (L) 13.0 - 17.0 g/dL   HCT 41.3 (L) 24.4 - 01.0 %   MCV 101.4 (H) 80.0 - 100.0 fL   MCH 32.5 26.0 - 34.0 pg   MCHC 32.0 30.0 - 36.0 g/dL   RDW 27.2 53.6 - 64.4 %   Platelets 293 150 - 400 K/uL   nRBC 0.0  0.0 - 0.2 %    Comment: Performed at [facility], [address], [place], [place] [address]  Glucose, capillary     Status: Abnormal   Collection Time: 11/11/19  6:29 AM  Result Value Ref Range   Glucose-Capillary 113 (H) 70 - 99 mg/dL    Comment: Glucose reference range applies only to samples taken after fasting for at least 8 hours.   Comment 1 Notify RN    Comment 2 Document in Chart    No results found.  Review of Systems  All other systems reviewed and are negative.   Blood pressure (!) 142/84, pulse 84, temperature 99.1 F (37.3 C), temperature source Oral, resp. rate 18, height 5\' 11"  (1.803 m), weight 113.4 kg, SpO2 98 %. Physical Exam  Constitutional: He is oriented to person, place, and time. He appears well-developed and well-nourished.  HENT:  Head: Atraumatic.  Eyes: EOM are normal.  Cardiovascular: Intact distal pulses.  Respiratory: Effort normal.  Musculoskeletal:     Comments: L shoulder with erythema swelling and warmth around incision.  Neurological: He is alert and oriented to person, place, and time.  Skin: Skin is warm and dry.  Psychiatric: He has a normal mood and affect.     Assessment/Plan 3wks s/p L reverse TSA with increased pain, swelling, redness.  C/o deep infection with history of multiple previous surgeries. Plan I&D with poly exchange, IV abx Risks /  benefits of surgery discussed Consent on chart  NPO for OR Preop antibiotics   , MD 11/11/2019, 7:23 AM

## 2019-11-11 NOTE — Op Note (Signed)
Procedure(s): LEFT SHOULDER IRRIGATION AND DEBRIDEMENT WITH POLY EXCHANGE Procedure Note  John [name] male 59 y.o. 11/11/2019  Preoperative diagnosis: Postoperative infection status post left reverse total shoulder arthroplasty  Postoperative diagnosis: Same   Procedure(s) and Anesthesia Type:    * LEFT SHOULDER IRRIGATION AND DEBRIDEMENT WITH POLY EXCHANGE - Choice   Indications:       Surgeon: Berline Lopes   Assistants: Damita Lack PA-C (Danielle was present and scrubbed throughout the procedure and was essential in positioning, retraction, exposure, and closure)  Anesthesia: General endotracheal anesthesia    Procedure Detail  LEFT SHOULDER IRRIGATION AND DEBRIDEMENT WITH POLY EXCHANGE   Estimated Blood Loss:  200 mL         Drains: none  Blood Given: none          Specimens: none        Complications:  * No complications entered in OR log *         Disposition: PACU - hemodynamically stable.         Condition: stable      OPERATIVE FINDINGS: There was fluid collection deep in the shoulder with phlegmonous material sent for culture.  The wound was copiously irrigated and debrided and the polyethylene component was exchanged.   PROCEDURE: The patient was identified in the preoperative holding area  where I personally marked the operative site after verifying site, side,  and procedure with the patient. An interscalene block given by  the attending anesthesiologist in the holding area and the patient was taken back to the operating room where all extremities were  carefully padded in position after general anesthesia was induced. He  was placed in a beach-chair position and the operative upper extremity was  prepped and draped in a standard sterile fashion.  Antibiotics were held until cultures could be obtained.  The previous incision was opened sharply.  Cultures were taken in the subcutaneous layer.  Once the subcutaneous layer was opened  the deep fluid collection was encountered.  This was cultured and multiple tissue samples were sent as well.  There was phlegmonous material noted consistent with either infection or resolving hematoma.  The rondure was used to remove all unhealthy and nonviable appearing tissue.  Copious irrigation was used with the pulse irrigator.  The joint was then dislocated and the polyethylene component was removed without difficulty.  At this point further copious pulse lavage irrigation was used and the deep joint was debrided.  Once it was felt to be adequately irrigated a new polyethylene component was impacted without difficulty.  The joint was then reduced.  A medium Hemovac drain was placed percutaneously out laterally.  The wound was then closed in layers with 0 Vicryl antibiotic coated in a deep layer with some 2-0 Vicryl in a more superficial layer and 2-0 Prolene for skin in a horizontal mattress configuration to try and evert the skin edges.  A bulky sterile dressing was applied and the patient was allowed to awaken from anesthesia placed in a sling transferred to the stretcher and taken to the recovery room in stable condition.   POSTOPERATIVE PLAN: Patient will be admitted for IV antibiotics.  We will follow cultures.  He will likely need PICC line placement and long-term antibiotics.  We will discuss this with infectious disease team.  We will hold on his anticoagulant for at least 48 hours to try and prevent postoperative hematoma.

## 2019-11-11 NOTE — Consult Note (Signed)
[facility] for Infectious Disease    Date of Admission:  11/11/2019   Total days of antibiotics: 0               Reason for Consult: Prosthetic Joint Infection    Referring Provider: Tamera Punt   Assessment: Septic arthritis, prosthetic joint  Plan: 1. Start vanco/ceftriaxone 2. Await his Cx 3. Place pic when pt able to consent (I explained in PACU) 4. Follow esr and crp.  5. Will watch cx to see if he has propionibacter which can be common in shoulder surgeries.   Thank you so much for this interesting consult,  Active Problems:   Status post reverse total shoulder replacement, left   . fentaNYL      . fentaNYL      . HYDROmorphone        HPI: John [name] is a 59 y.o. male with hx of non-ischemic CM, hx of L shoulder fracture while playing football with family. He underwent ORIF ~ 1 year per pt.  He returned 2-2 and underwent revision to arthroplasty due to continued pain and degeneration/collapse of humeral head.  He returns today with continued erythema, pain and swelling.  He underwent I & D, polyexchange today.  He denies f/c. Has noted erythema on arm.   Review of Systems: Review of Systems  Constitutional: Negative for chills and fever.  Gastrointestinal: Negative for constipation and diarrhea.  Genitourinary: Negative for dysuria.  Musculoskeletal: Positive for joint pain and myalgias.  Please see HPI. All other systems reviewed and negative.   Past Medical History:  Diagnosis Date  . Anxiety    Ativan  . Chronic combined systolic and diastolic CHF, NYHA class 3 ([place]) 10/2016   Nonischemic cardiomyopathy. EF 20-25%.  . Chronic left hip pain   . Depression    history of  . Diabetes mellitus without complication ([place])    denies  . Dysrhythmia    A-FIB  . GI bleed 03/2016  . Headache   . Hypertensive heart disease with combined systolic and diastolic congestive heart failure ([place]) 10/2016  . Nonischemic cardiomyopathy ([place]) 10/2016    Echo with EF 20-25%. Cardiac catheterization with no CAD. LVEDP was 41 mmHg, PCWP 36 mmHg  . Osteoarthritis    right shoulder  . Right hand fracture   . Seasonal allergies   . Sleep apnea    wears a CPAP  . Wears glasses     Social History   Tobacco Use  . Smoking status: Never Smoker  . Smokeless tobacco: Never Used  Substance Use Topics  . Alcohol use: Yes    Alcohol/week: 3.0 standard drinks    Types: 3 Cans of beer per week    Comment: occasional  . Drug use: No    Family History  Problem Relation Age of Onset  . Congenital heart disease Sister      Medications:  Scheduled: . fentaNYL      . fentaNYL      . HYDROmorphone        Abtx:  Anti-infectives (From admission, onward)   Start     Dose/Rate Route Frequency Ordered Stop   11/11/19 0747  vancomycin (VANCOCIN) 1-5 GM/200ML-% IVPB    Note to Pharmacy: Bridget Hartshorn   : cabinet override      11/11/19 0747 11/11/19 1959        OBJECTIVE: Blood pressure 120/69, pulse 72, temperature 98.6 F (37 C), resp. rate 11, height 5'  11" (1.803 m), weight 113.4 kg, SpO2 99 %.  Physical Exam Constitutional:      General: He is not in acute distress.    Appearance: He is not toxic-appearing.  HENT:     Mouth/Throat:     Mouth: Mucous membranes are moist.     Pharynx: No oropharyngeal exudate.  Eyes:     Extraocular Movements: Extraocular movements intact.     Pupils: Pupils are equal, round, and reactive to light.  Cardiovascular:     Rate and Rhythm: Normal rate and regular rhythm.  Pulmonary:     Effort: Pulmonary effort is normal.     Breath sounds: Normal breath sounds.  Abdominal:     General: Bowel sounds are normal. There is no distension.     Palpations: Abdomen is soft.     Tenderness: There is no abdominal tenderness.  Musculoskeletal:     Cervical back: Normal range of motion and neck supple.     Right lower leg: No edema.     Left lower leg: No edema.     Comments: L shoulder dressed.    Neurological:     Mental Status: He is alert.     Lab Results Results for orders placed or performed during the hospital encounter of 11/11/19 (from the past 48 hour(s))  Basic metabolic panel     Status: Abnormal   Collection Time: 11/11/19  6:28 AM  Result Value Ref Range   Sodium 136 135 - 145 mmol/L   Potassium 4.2 3.5 - 5.1 mmol/L   Chloride 104 98 - 111 mmol/L   CO2 22 22 - 32 mmol/L   Glucose, Bld 123 (H) 70 - 99 mg/dL    Comment: Glucose reference range applies only to samples taken after fasting for at least 8 hours.   BUN 14 6 - 20 mg/dL   Creatinine, Ser 0.95 0.61 - 1.24 mg/dL   Calcium 9.4 8.9 - 10.3 mg/dL   GFR calc non Af Amer >60 >60 mL/min   GFR calc Af Amer >60 >60 mL/min   Anion gap 10 5 - 15    Comment: Performed at [facility], [place] [address], [place], [place] [address]  CBC     Status: Abnormal   Collection Time: 11/11/19  6:28 AM  Result Value Ref Range   WBC 6.5 4.0 - 10.5 K/uL   RBC 3.54 (L) 4.22 - 5.81 MIL/uL   Hemoglobin 11.5 (L) 13.0 - 17.0 g/dL   HCT 35.9 (L) 39.0 - 52.0 %   MCV 101.4 (H) 80.0 - 100.0 fL   MCH 32.5 26.0 - 34.0 pg   MCHC 32.0 30.0 - 36.0 g/dL   RDW 12.5 11.5 - 15.5 %   Platelets 293 150 - 400 K/uL   nRBC 0.0 0.0 - 0.2 %    Comment: Performed at [facility], [place] [address], [place], [place] [address]  Glucose, capillary     Status: Abnormal   Collection Time: 11/11/19  6:29 AM  Result Value Ref Range   Glucose-Capillary 113 (H) 70 - 99 mg/dL    Comment: Glucose reference range applies only to samples taken after fasting for at least 8 hours.   Comment 1 Notify RN    Comment 2 Document in Chart    No results found for: SDES, SPECREQUEST, CULT, REPTSTATUS No results found. Recent Results (from the past 240 hour(s))  SARS CORONAVIRUS 2 (TAT 6-24 HRS) Nasopharyngeal Nasopharyngeal Swab     Status:  None   Collection Time: 11/10/19  2:29 PM   Specimen: Nasopharyngeal Swab  Result Value  Ref Range Status   SARS Coronavirus 2 NEGATIVE NEGATIVE Final    Comment: (NOTE) SARS-CoV-2 target nucleic acids are NOT DETECTED. The SARS-CoV-2 RNA is generally detectable in upper and lower respiratory specimens during the acute phase of infection. Negative results do not preclude SARS-CoV-2 infection, do not rule out co-infections with other pathogens, and should not be used as the sole basis for treatment or other patient management decisions. Negative results must be combined with clinical observations, patient history, and epidemiological information. The expected result is Negative. Fact Sheet for Patients: SugarRoll.be Fact Sheet for Healthcare Providers: https://www.woods-mathews.com/ This test is not yet approved or cleared by the [place] FDA and  has been authorized for detection and/or diagnosis of SARS-CoV-2 by FDA under an Emergency Use Authorization (EUA). This EUA will remain  in effect (meaning this test can be used) for the duration of the COVID-19 declaration under Section 56 4(b)(1) of the Act, 21 U.S.C. section 360bbb-3(b)(1), unless the authorization is terminated or revoked sooner. Performed at [facility], [place] [address], [place], [place] [address]     Microbiology: Recent Results (from the past 240 hour(s))  SARS CORONAVIRUS 2 (TAT 6-24 HRS) Nasopharyngeal Nasopharyngeal Swab     Status: None   Collection Time: 11/10/19  2:29 PM   Specimen: Nasopharyngeal Swab  Result Value Ref Range Status   SARS Coronavirus 2 NEGATIVE NEGATIVE Final    Comment: (NOTE) SARS-CoV-2 target nucleic acids are NOT DETECTED. The SARS-CoV-2 RNA is generally detectable in upper and lower respiratory specimens during the acute phase of infection. Negative results do not preclude SARS-CoV-2 infection, do not rule out co-infections with other pathogens, and should not be used as the sole basis for treatment or other patient  management decisions. Negative results must be combined with clinical observations, patient history, and epidemiological information. The expected result is Negative. Fact Sheet for Patients: SugarRoll.be Fact Sheet for Healthcare Providers: https://www.woods-mathews.com/ This test is not yet approved or cleared by the [place] FDA and  has been authorized for detection and/or diagnosis of SARS-CoV-2 by FDA under an Emergency Use Authorization (EUA). This EUA will remain  in effect (meaning this test can be used) for the duration of the COVID-19 declaration under Section 56 4(b)(1) of the Act, 21 U.S.C. section 360bbb-3(b)(1), unless the authorization is terminated or revoked sooner. Performed at [facility], [place] [address], [place], [place] [address]     Radiographs and labs were personally reviewed by me.   Bobby Rumpf, MD [facility] for [facility] [phone] 11/11/2019, 9:46 AM

## 2019-11-11 NOTE — Progress Notes (Signed)
Pt declined cpap machine for tonight.

## 2019-11-12 ENCOUNTER — Observation Stay: Payer: Self-pay

## 2019-11-12 ENCOUNTER — Encounter: Payer: Self-pay | Admitting: *Deleted

## 2019-11-12 DIAGNOSIS — Z8781 Personal history of (healed) traumatic fracture: Secondary | ICD-10-CM | POA: Diagnosis not present

## 2019-11-12 DIAGNOSIS — M009 Pyogenic arthritis, unspecified: Secondary | ICD-10-CM | POA: Diagnosis not present

## 2019-11-12 DIAGNOSIS — Z95810 Presence of automatic (implantable) cardiac defibrillator: Secondary | ICD-10-CM

## 2019-11-12 DIAGNOSIS — T8459XA Infection and inflammatory reaction due to other internal joint prosthesis, initial encounter: Secondary | ICD-10-CM | POA: Diagnosis not present

## 2019-11-12 DIAGNOSIS — Z96612 Presence of left artificial shoulder joint: Secondary | ICD-10-CM | POA: Diagnosis not present

## 2019-11-12 DIAGNOSIS — T8142XA Infection following a procedure, deep incisional surgical site, initial encounter: Secondary | ICD-10-CM | POA: Diagnosis not present

## 2019-11-12 LAB — BASIC METABOLIC PANEL
Anion gap: 10 (ref 5–15)
BUN: 12 mg/dL (ref 6–20)
CO2: 25 mmol/L (ref 22–32)
Calcium: 8.9 mg/dL (ref 8.9–10.3)
Chloride: 100 mmol/L (ref 98–111)
Creatinine, Ser: 0.86 mg/dL (ref 0.61–1.24)
GFR calc Af Amer: >60 mL/min (ref >60)
GFR calc non Af Amer: >60 mL/min (ref >60)
Glucose, Bld: 160 mg/dL — ABNORMAL HIGH (ref 70–99)
Potassium: 3.9 mmol/L (ref 3.5–5.1)
Sodium: 135 mmol/L (ref 135–145)

## 2019-11-12 LAB — CBC
HCT: 31.9 % — ABNORMAL LOW (ref 39.0–52.0)
Hemoglobin: 9.9 g/dL — ABNORMAL LOW (ref 13.0–17.0)
MCH: 32.6 pg (ref 26.0–34.0)
MCHC: 31 g/dL (ref 30.0–36.0)
MCV: 104.9 fL — ABNORMAL HIGH (ref 80.0–100.0)
Platelets: 264 10*3/uL (ref 150–400)
RBC: 3.04 MIL/uL — ABNORMAL LOW (ref 4.22–5.81)
RDW: 12.7 % (ref 11.5–15.5)
WBC: 10.7 10*3/uL — ABNORMAL HIGH (ref 4.0–10.5)
nRBC: 0 % (ref 0.0–0.2)

## 2019-11-12 MED ORDER — TIZANIDINE HCL 4 MG PO TABS: 4.0000 mg | ORAL_TABLET | Freq: Three times a day (TID) | ORAL | 1 refills | Status: DC | PRN

## 2019-11-12 MED ORDER — OXYCODONE-ACETAMINOPHEN 5-325 MG PO TABS: 1.0000 | ORAL_TABLET | ORAL | 0 refills | Status: DC | PRN

## 2019-11-12 NOTE — Progress Notes (Signed)
Peripherally Inserted Central Catheter/Midline Placement  The IV Nurse has discussed with the patient and/or persons authorized to consent for the patient, the purpose of this procedure and the potential benefits and risks involved with this procedure.  The benefits include less needle sticks, lab draws from the catheter, and the patient may be discharged home with the catheter. Risks include, but not limited to, infection, bleeding, blood clot (thrombus formation), and puncture of an artery; nerve damage and irregular heartbeat and possibility to perform a PICC exchange if needed/ordered by physician.  Alternatives to this procedure were also discussed.  Bard Power PICC patient education guide, fact sheet on infection prevention and patient information card has been provided to patient /or left at bedside.    PICC/Midline Placement Documentation  PICC Single Lumen 11/12/19 PICC Right Basilic 39 cm 0 cm (Active)  Indication for Insertion or Continuance of Line Home intravenous therapies (PICC only) 11/12/19 1800  Exposed Catheter (cm) 0 cm 11/12/19 1800  Site Assessment Clean;Dry;Intact 11/12/19 1800  Line Status Flushed;Saline locked;Blood return noted 11/12/19 1800  Dressing Type Transparent;Securing device 11/12/19 1800  Dressing Status Clean;Dry;Intact;Antimicrobial disc in place 11/12/19 1800  Line Care Connections checked and tightened 11/12/19 1800  Dressing Intervention New dressing;Other (Comment) 11/12/19 1800  Dressing Change Due 11/19/19 11/12/19 1800       Tonna Boehringer 11/12/2019, 6:07 PM

## 2019-11-12 NOTE — Progress Notes (Addendum)
INFECTIOUS DISEASE PROGRESS NOTE  ID: John [name] is a 59 y.o. male with  Active Problems:   Status post reverse total shoulder replacement, left  Subjective: No complaints.   Abtx:  Anti-infectives (From admission, onward)   Start     Dose/Rate Route Frequency Ordered Stop   11/11/19 1600  vancomycin (VANCOREADY) IVPB 1250 mg/250 mL     1,250 mg 166.7 mL/hr over 90 Minutes Intravenous Every 12 hours 11/11/19 1038     11/11/19 1200  cefTRIAXone (ROCEPHIN) 2 g in sodium chloride 0.9 % 100 mL IVPB     2 g 200 mL/hr over 30 Minutes Intravenous Every 24 hours 11/11/19 0955 12/23/19 1159   11/11/19 1115  vancomycin (VANCOCIN) IVPB 1000 mg/200 mL premix  Status:  Discontinued     1,000 mg 200 mL/hr over 60 Minutes Intravenous Every 12 hours 11/11/19 1100 11/11/19 1116   11/11/19 0747  vancomycin (VANCOCIN) 1-5 GM/200ML-% IVPB    Note to Pharmacy: Wylene Simmer   : cabinet override      11/11/19 0747 11/11/19 1959      Medications:  Scheduled: . canagliflozin  100 mg Oral QAC breakfast  . carvedilol  12.5 mg Oral BID WC  . docusate sodium  100 mg Oral BID  . furosemide  40 mg Oral Daily  . sacubitril-valsartan  1 tablet Oral BID  . sertraline  50 mg Oral Daily  . spironolactone  25 mg Oral Daily    Objective: Vital signs in last 24 hours: Temp:  [97.8 F (36.6 C)-98.4 F (36.9 C)] 98.1 F (36.7 C) (02/26 0646) Pulse Rate:  [62-84] 84 (02/26 0944) Resp:  [11-18] 18 (02/26 0944) BP: (118-145)/(50-92) 122/75 (02/26 0944) SpO2:  [95 %-99 %] 98 % (02/26 0944)   General appearance: alert, cooperative and no distress Resp: clear to auscultation bilaterally Cardio: regular rate and rhythm GI: normal findings: bowel sounds normal and soft, non-tender Extremities: L shoulder wound clean.   Lab Results Recent Labs    11/11/19 0628 11/12/19 0227  WBC 6.5 10.7*  HGB 11.5* 9.9*  HCT 35.9* 31.9*  NA 136 135  K 4.2 3.9  CL 104 100  CO2 22 25  BUN 14 12    CREATININE 0.95 0.86   Liver Panel No results for input(s): PROT, ALBUMIN, AST, ALT, ALKPHOS, BILITOT, BILIDIR, IBILI in the last 72 hours. Sedimentation Rate Recent Labs    11/11/19 1010  ESRSEDRATE 41*   C-Reactive Protein Recent Labs    11/11/19 1010  CRP 1.6*    Microbiology: Recent Results (from the past 240 hour(s))  SARS CORONAVIRUS 2 (TAT 6-24 HRS) Nasopharyngeal Nasopharyngeal Swab     Status: None   Collection Time: 11/10/19  2:29 PM   Specimen: Nasopharyngeal Swab  Result Value Ref Range Status   SARS Coronavirus 2 NEGATIVE NEGATIVE Final    Comment: (NOTE) SARS-CoV-2 target nucleic acids are NOT DETECTED. The SARS-CoV-2 RNA is generally detectable in upper and lower respiratory specimens during the acute phase of infection. Negative results do not preclude SARS-CoV-2 infection, do not rule out co-infections with other pathogens, and should not be used as the sole basis for treatment or other patient management decisions. Negative results must be combined with clinical observations, patient history, and epidemiological information. The expected result is Negative. Fact Sheet for Patients: HairSlick.no Fact Sheet for Healthcare Providers: quierodirigir.com This test is not yet approved or cleared by the [place] FDA and  has been authorized for detection and/or diagnosis of  SARS-CoV-2 by FDA under an Emergency Use Authorization (EUA). This EUA will remain  in effect (meaning this test can be used) for the duration of the COVID-19 declaration under Section 56 4(b)(1) of the Act, 21 U.S.C. section 360bbb-3(b)(1), unless the authorization is terminated or revoked sooner. Performed at [facility], [place] [address], [place], [place] [address]   Aerobic/Anaerobic Culture (surgical/deep wound)     Status: None (Preliminary result)   Collection Time: 11/11/19  8:04 AM   Specimen: Wound  Result Value  Ref Range Status   Specimen Description   Final    WOUND subcutanious lt shoulder Performed at [facility] [address] [place], [place] [address]    Special Requests   Final    NONE Performed at [facility], [place] [address], [place], [place] [address]    Gram Stain   Final    RARE WBC PRESENT,BOTH PMN AND MONONUCLEAR NO ORGANISMS SEEN Performed at [facility], [place] [address], [place], [place] [address]    Culture PENDING  Incomplete   Report Status PENDING  Incomplete  Aerobic/Anaerobic Culture (surgical/deep wound)     Status: None (Preliminary result)   Collection Time: 11/11/19  8:07 AM   Specimen: Soft Tissue, Other  Result Value Ref Range Status   Specimen Description   Final    TISSUE LT SHOULDER DEEP Performed at [facility] [address] [place], [place] [address]    Special Requests   Final    NONE Performed at [facility], [place] [address] [place], [place] [address]    Gram Stain   Final    FEW WBC PRESENT,BOTH PMN AND MONONUCLEAR NO ORGANISMS SEEN Performed at [facility], [place] [address], [place], [place] [address]    Culture PENDING  Incomplete   Report Status PENDING  Incomplete  Aerobic/Anaerobic Culture (surgical/deep wound)     Status: None (Preliminary result)   Collection Time: 11/11/19  8:08 AM   Specimen: Wound  Result Value Ref Range Status   Specimen Description   Final    WOUND DEEP LT SHOULDER JOINT Performed at [place] [address], [place], [place] [address]    Special Requests   Final    NONE Performed at [facility], [place] [address] [place], [place] [address]    Gram Stain   Final    RARE WBC PRESENT,BOTH PMN AND MONONUCLEAR NO ORGANISMS SEEN Performed at [facility], [place] [address] [place], [place] [address]    Culture PENDING  Incomplete   Report Status PENDING  Incomplete     Studies/Results: [place] EKG SITE RITE  Result Date: 11/12/2019 If Site Rite image not attached, placement could not be confirmed due to current cardiac rhythm.    Assessment/Plan: Prosthetic joint infection L shoulder I & D with polyexchange AICD post viral CM  Total days of antibiotics: 1 vanco-ceftriaxone        Cx pending Await PIC Advance aware I asked lab to hold Cx for propionibacter Consider adding rifampin if no drug interactions and Cx staph  [facility] Hx L shoulder fracture 09-08-17 ORIF L shoulder 09-08-2017 Removal of Hardware 11-2018   Bobby Rumpf MD, FACP Infectious Diseases (pager) [phone] [email or web address] 11/12/2019, 9:52 AM  LOS: 0 days

## 2019-11-12 NOTE — Plan of Care (Signed)
  Problem: Education: Goal: Knowledge of the prescribed therapeutic regimen will improve 11/12/2019 1959 by Lovenia Shuck, RN Outcome: Progressing  Problem: Education: Goal: Understanding of activity limitations/precautions following surgery will improve 11/12/2019 1959 by Lovenia Shuck, RN Outcome: Progressing

## 2019-11-12 NOTE — TOC Initial Note (Signed)
Transition of Care [facility]) - Initial/Assessment Note    Patient Details  Name: John [name] MRN: [id number] Date of Birth: May 19, 1961  Transition of [facility]) CM/SW Contact:    Ida Rogue, LCSW Phone Number: 11/12/2019, 8:26 AM  Clinical Narrative:  Based on consult to [facility], messaged Pam with [facility] about patient's need for IV IBX post d/c. TOC will continue to follow during the course of hospitalization.                  Expected Discharge Plan: Home w Home Health Services Barriers to Discharge: No Barriers Identified   Patient Goals and CMS Choice        Expected Discharge Plan and Services Expected Discharge Plan: Home w Home Health Services                                              Prior Living Arrangements/Services                       Activities of Daily Living Home Assistive Devices/Equipment: None ADL Screening (condition at time of admission) Patient's cognitive ability adequate to safely complete daily activities?: Yes Is the patient deaf or have difficulty hearing?: No Does the patient have difficulty seeing, even when wearing glasses/contacts?: No Does the patient have difficulty concentrating, remembering, or making decisions?: No Patient able to express need for assistance with ADLs?: Yes Does the patient have difficulty dressing or bathing?: No Independently performs ADLs?: Yes (appropriate for developmental age) Does the patient have difficulty walking or climbing stairs?: No Weakness of Legs: None Weakness of Arms/Hands: None  Permission Sought/Granted                  Emotional Assessment              Admission diagnosis:  Status post reverse total shoulder replacement, left [Z96.612] Patient Active Problem List   Diagnosis Date Noted  . Status post reverse total shoulder replacement, left 11/11/2019  . Hyperlipidemia 05/27/2018  . Cardiomyopathy (HCC) 05/06/2018  . At risk for sudden cardiac death    . Cough 10/17/17  . S/P ORIF (open reduction internal fixation) fracture 09/08/2017  . Unspecified fracture of upper end of left humerus, initial encounter for closed fracture 09/03/2017  . Closed fracture of head of left humerus 09/02/2017  . Class 1 obesity due to excess calories in adult 01/02/2017  . Essential hypertension 11/08/2016  . Non-ischemic cardiomyopathy (HCC)   . Hyponatremia 10/18/2016  . AKI (acute kidney injury) (HCC) 10/18/2016  . Acute systolic congestive heart failure, NYHA class 4 (HCC) 10/17/2016  . Lower extremity edema 10/17/2016  . GI bleed 03/19/2016  . Elevated troponin 03/19/2016  . Acute blood loss anemia 03/19/2016  . EtOH dependence (HCC) 03/19/2016  . Acute upper GI bleed 03/19/2016  . S/P shoulder replacement 11/24/2015  . Avascular necrosis (HCC) 10/09/2015  . Allergic rhinitis 08/21/2015  . ED (erectile dysfunction) 08/21/2015  . Hyperglycemia 08/21/2015  . Insomnia 08/21/2015  . Neuropathy, peripheral 08/21/2015  . Palpitations 08/21/2015  . Sleep apnea 08/21/2015  . Elevated transaminase level 03/27/2015  . Degenerative joint disease of left hip 03/27/2015   PCP:  Margaree Mackintosh, MD Pharmacy:   [id number] - [address], [place] - [address] [place] [address] Phone: [phone] Fax: [phone]  Social Determinants of Health (SDOH) Interventions    Readmission Risk Interventions No flowsheet data found.

## 2019-11-12 NOTE — Progress Notes (Signed)
   PATIENT ID: John [name]   1 Day Post-Op Procedure(s) (LRB): LEFT SHOULDER IRRIGATION AND DEBRIDEMENT WITH POLY EXCHANGE (Left)  Subjective: Doing well, fairly comfortable overnight without a block. No complaints or concerns.   Objective:  Vitals:   11/12/19 0130 11/12/19 0646  BP: (!) 145/85 119/77  Pulse: 72 77  Resp: 18 16  Temp: 98.4 F (36.9 C) 98.1 F (36.7 C)  SpO2: 98% 97%    L UE dressing c/d/i Wiggles fingers, distally NVI   Labs:  Recent Labs    11/11/19 0628 11/12/19 0227  HGB 11.5* 9.9*   Recent Labs    11/11/19 0628 11/12/19 0227  WBC 6.5 10.7*  RBC 3.54* 3.04*  HCT 35.9* 31.9*  PLT 293 264   Recent Labs    11/11/19 0628 11/12/19 0227  NA 136 135  K 4.2 3.9  CL 104 100  CO2 22 25  BUN 14 12  CREATININE 0.95 0.86  GLUCOSE 123* 160*  CALCIUM 9.4 8.9    Assessment and Plan: 1 day s/p L reverse shoulder I &D  Cultures pending ID consulted and we appreciate their recommendations- IV ABX started, PIC to be placed today  Pull drain tomorrow, change dressing tomorrow (PA Jim to do) Dc home tomorrow, SW consulted to arrange home ABX Fu in 2 weeks  VTE proph: eliquis, on hold. SCDs

## 2019-11-13 DIAGNOSIS — T8142XA Infection following a procedure, deep incisional surgical site, initial encounter: Secondary | ICD-10-CM | POA: Diagnosis not present

## 2019-11-13 DIAGNOSIS — M009 Pyogenic arthritis, unspecified: Secondary | ICD-10-CM | POA: Diagnosis present

## 2019-11-13 MED ORDER — SODIUM CHLORIDE 0.9 % IV SOLN: 2.0000 g | INTRAVENOUS | 0 refills | Status: AC

## 2019-11-13 MED ORDER — VANCOMYCIN HCL 1250 MG/250ML IV SOLN: 1250.0000 mg | Freq: Two times a day (BID) | INTRAVENOUS | 0 refills | Status: AC

## 2019-11-13 MED ORDER — HEPARIN SOD (PORK) LOCK FLUSH 100 UNIT/ML IV SOLN
250.0000 [IU] | INTRAVENOUS | Status: AC | PRN
  Administered 2019-11-13: 250 [IU]
  Filled 2019-11-13: qty 2.5

## 2019-11-13 NOTE — Progress Notes (Signed)
    [facility]   Reason for visit: Follow up on PJI  Interval History: no growth to date in the cultures; micro to hold culture longer for growth Ready for discharge  Physical Exam: Constitutional:  Vitals:   11/12/19 2131 11/13/19 0554  BP: 112/79 (!) 97/49  Pulse: 64 (!) 57  Resp: 16 16  Temp: 98.1 F (36.7 C) 97.8 F (36.6 C)  SpO2: 96% 94%   patient appears in NAD  A/P Left shoulder PJI and s/p I and D with polyexchange.   Will use vancomycin and ceftriaxone pending any culture growth I will continue to monitor culture after discharge and change antibiotics as appropriate He will be on vancomycin and ceftriaxone for a planned 6 weeks through April 7th I will arrange follow up in 2-3 weeks

## 2019-11-13 NOTE — Progress Notes (Signed)
Subjective: 2 Days Post-Op Procedure(s) (LRB): LEFT SHOULDER IRRIGATION AND DEBRIDEMENT WITH POLY EXCHANGE (Left) Patient reports pain as mild.  Patient is without Complaints.  Ready to be discharged.  Objective: Vital signs in last 24 hours: Temp:  [97.8 F (36.6 C)-98.3 F (36.8 C)] 97.8 F (36.6 C) (02/27 0554) Pulse Rate:  [57-64] 57 (02/27 0554) Resp:  [16] 16 (02/27 0554) BP: (97-123)/(49-79) 97/49 (02/27 0554) SpO2:  [94 %-99 %] 94 % (02/27 0554)  Intake/Output from previous day: 02/26 0701 - 02/27 0700 In: 1449.4 [P.O.:660; I.V.:289.4; IV Piggyback:500] Out: 775 [Urine:775] Intake/Output this shift: No intake/output data recorded.  Recent Labs    11/11/19 0628 11/12/19 0227  HGB 11.5* 9.9*   Recent Labs    11/11/19 0628 11/12/19 0227  WBC 6.5 10.7*  RBC 3.54* 3.04*  HCT 35.9* 31.9*  PLT 293 264   Recent Labs    11/11/19 0628 11/12/19 0227  NA 136 135  K 4.2 3.9  CL 104 100  CO2 22 25  BUN 14 12  CREATININE 0.95 0.86  GLUCOSE 123* 160*  CALCIUM 9.4 8.9   TISSUE LT SHOULDER DEEP  Performed at [facility], [address], [place], [place] [address]    Special Requests NONE  Performed at [facility], [address], [place], [place] [address]   Gram Stain FEW WBC PRESENT,BOTH PMN AND MONONUCLEAR  NO ORGANISMS SEEN   Culture NO GROWTH 1 DAY  Performed at [facility], [address] [place], [place] [address]   Report Status PENDING     No results for input(s): LABPT, INR in the last 72 hours. Left shoulder exam: Sling intact.  Left shoulder anterior surgical incision is benign.  Sutures intact.  Neurovascular status is intact to the left upper extremity.  No drainage from his incision.  No significant redness.PICC line is in place.    Assessment/Plan: 2 Days Post-Op Procedure(s) (LRB): LEFT SHOULDER IRRIGATION AND DEBRIDEMENT WITH POLY EXCHANGE (Left)  Plan: Okay to discharge home today from  ortho viewpoint.  His left shoulder drain came out yesterday.Left shoulder dressing was changed and an aqua cell dressing was placed. We have sent in Rx for Percocet and tizanidine. IV antibiotics per ID. I spoke to his nurse this morning about contacting ID for confirmation of IV antibiotics and duration of treatment.  PICC line is in place. Follow-up with Dr. Ave Filter in 10-14 days.   John [name] 11/13/2019, 9:47 AM

## 2019-11-13 NOTE — Progress Notes (Signed)
Pt stable at time of d/c. No needs at time of d/c. No questions on d/c instructions and education given. Pt has iv antibiotics arranged prior to d/c. Pt picc line attended to by iv team prior to d/c.

## 2019-11-13 NOTE — Discharge Summary (Signed)
Patient ID: John [name] MRN: [id number] DOB/AGE: 59/20/1962 59 y.o.  Admit date: 11/11/2019 Discharge date: 11/13/2019  Admission Diagnoses:  Principal Problem:   Septic joint of left shoulder region [facility]) Active Problems:   Status post reverse total shoulder replacement, left   Discharge Diagnoses:  Same  Past Medical History:  Diagnosis Date  . Anxiety    Ativan  . Chronic combined systolic and diastolic CHF, NYHA class 3 (HCC) 10/2016   Nonischemic cardiomyopathy. EF 20-25%.  . Chronic left hip pain   . Depression    history of  . Diabetes mellitus without complication (HCC)    denies  . Dysrhythmia    A-FIB  . GI bleed 03/2016  . Headache   . Hypertensive heart disease with combined systolic and diastolic congestive heart failure (HCC) 10/2016  . Nonischemic cardiomyopathy (HCC) 10/2016   Echo with EF 20-25%. Cardiac catheterization with no CAD. LVEDP was 41 mmHg, PCWP 36 mmHg  . Osteoarthritis    right shoulder  . Right hand fracture   . Seasonal allergies   . Sleep apnea    wears a CPAP  . Wears glasses     Surgeries: Procedure(s): LEFT SHOULDER IRRIGATION AND DEBRIDEMENT WITH POLY EXCHANGE on 11/11/2019   Consultants: Infectious disease.  Dr. Ninetta Lights and Dr. Luciana Axe  Discharged Condition: Improved  Hospital Course: John [name] is an 59 y.o. male who was admitted 11/11/2019 for operative treatment ofSeptic joint of left shoulder region [facility]). Patient has severe unremitting pain that affects sleep, daily activities, and work/hobbies. After pre-op clearance the patient was taken to the operating room on 11/11/2019 and underwent  Procedure(s): LEFT SHOULDER IRRIGATION AND DEBRIDEMENT WITH POLY EXCHANGE.    Patient was given perioperative antibiotics:  Anti-infectives (From admission, onward)   Start     Dose/Rate Route Frequency Ordered Stop   11/14/19 0000  cefTRIAXone 2 g in sodium chloride 0.9 % 100 mL     2 g Intravenous Every 24 hours 11/13/19 1827  12/22/19 2359   11/14/19 0000  vancomycin (VANCOREADY) 1250 MG/250ML SOLN     1,250 mg Intravenous Every 12 hours 11/13/19 1827 12/03/19 2359   11/11/19 1600  vancomycin (VANCOREADY) IVPB 1250 mg/250 mL     1,250 mg 166.7 mL/hr over 90 Minutes Intravenous Every 12 hours 11/11/19 1038     11/11/19 1200  cefTRIAXone (ROCEPHIN) 2 g in sodium chloride 0.9 % 100 mL IVPB     2 g 200 mL/hr over 30 Minutes Intravenous Every 24 hours 11/11/19 0955 12/23/19 1159   11/11/19 1115  vancomycin (VANCOCIN) IVPB 1000 mg/200 mL premix  Status:  Discontinued     1,000 mg 200 mL/hr over 60 Minutes Intravenous Every 12 hours 11/11/19 1100 11/11/19 1116   11/11/19 0747  vancomycin (VANCOCIN) 1-5 GM/200ML-% IVPB    Note to Pharmacy: Wylene Simmer   : cabinet override      11/11/19 0747 11/11/19 1959       Patient was given sequential compression devices, early ambulation, and chemoprophylaxis to prevent DVT.  Patient benefited maximally from hospital stay and there were no complications.    Recent vital signs:  Patient Vitals for the past 24 hrs:  BP Temp Temp src Pulse Resp SpO2  11/13/19 1724 135/77 (!) 96.9 F (36.1 C) - 76 18 100 %  11/13/19 1503 - - - - - 94 %  11/13/19 0554 (!) 97/49 97.8 F (36.6 C) Oral (!) 57 16 94 %  11/12/19 2131 112/79 98.1 F (36.7 C)  Oral 64 16 96 %     Recent laboratory studies:  Recent Labs    11/11/19 0628 11/11/19 0628 11/12/19 0227  WBC 6.5  --  10.7*  HGB 11.5*  --  9.9*  HCT 35.9*  --  31.9*  PLT 293  --  264  NA 136  --  135  K 4.2  --  3.9  CL 104  --  100  CO2 22  --  25  BUN 14  --  12  CREATININE 0.95  --  0.86  GLUCOSE 123*  --  160*  CALCIUM 9.4   < > 8.9   < > = values in this interval not displayed.  no growth to date in the cultures; micro to hold culture longer for growth    Discharge Medications:   Allergies as of 11/13/2019   No Known Allergies     Medication List    STOP taking these medications   acetaminophen 325 MG  tablet Commonly known as: TYLENOL     TAKE these medications   apixaban 5 MG Tabs tablet Commonly known as: ELIQUIS Take 1 tablet (5 mg total) by mouth 2 (two) times daily.   carvedilol 12.5 MG tablet Commonly known as: COREG Take 1 tablet (12.5 mg total) by mouth 2 (two) times daily with a meal.   cefTRIAXone 2 g in sodium chloride 0.9 % 100 mL Inject 2 g into the vein daily for 38 doses. Start taking on: November 14, 2019   dapagliflozin propanediol 10 MG Tabs tablet Commonly known as: FARXIGA Take 10 mg by mouth daily before breakfast. What changed: when to take this   furosemide 40 MG tablet Commonly known as: LASIX Take 1 tablet (40 mg total) by mouth daily.   LORazepam 1 MG tablet Commonly known as: ATIVAN Take 0.5-1 mg by mouth every 8 (eight) hours as needed for anxiety.   multivitamin with minerals tablet Take 1 tablet by mouth daily.   oxyCODONE-acetaminophen 5-325 MG tablet Commonly known as: Percocet Take 1-2 tablets by mouth every 4 (four) hours as needed for severe pain. Take 1-2 tablets every 4 hours as needed for post operative pain. MAX 6/day What changed:   how much to take  how to take this  when to take this  reasons to take this   potassium chloride SA 20 MEQ tablet Commonly known as: Klor-Con M20 Take 1 tablet (20 mEq total) by mouth daily. Take 1 tablet by mouth daily.   rosuvastatin 20 MG tablet Commonly known as: CRESTOR TAKE 1 TABLET BY MOUTH EVERY DAY   sacubitril-valsartan 49-51 MG Commonly known as: ENTRESTO Take 1 tablet by mouth 2 (two) times daily.   sertraline 50 MG tablet Commonly known as: ZOLOFT TAKE 1 TABLET BY MOUTH EVERY DAY   sildenafil 100 MG tablet Commonly known as: VIAGRA Take 0.5 tablets (50 mg total) by mouth at bedtime as needed for erectile dysfunction.   spironolactone 25 MG tablet Commonly known as: ALDACTONE TAKE 1 TABLET BY MOUTH EVERY DAY   tiZANidine 4 MG tablet Commonly known as:  Zanaflex Take 1 tablet (4 mg total) by mouth every 8 (eight) hours as needed for muscle spasms. What changed: Another medication with the same name was added. Make sure you understand how and when to take each.   tiZANidine 4 MG tablet Commonly known as: Zanaflex Take 1 tablet (4 mg total) by mouth every 8 (eight) hours as needed for muscle spasms. What changed: You were  already taking a medication with the same name, and this prescription was added. Make sure you understand how and when to take each.   vancomycin 1250 MG/250ML Soln Commonly known as: VANCOREADY Inject 250 mLs (1,250 mg total) into the vein every 12 (twelve) hours for 38 doses. Start taking on: November 14, 2019       Diagnostic Studies: DG Chest 2 View  Result Date: 10/15/2019 CLINICAL DATA:  Pre-op respiratory exam for left shoulder arthroplasty. Combined systolic and diastolic congestive heart failure. EXAM: CHEST - 2 VIEW COMPARISON:  08/24/2018 FINDINGS: The heart size and mediastinal contours are within normal limits. Single lead pacemaker remains in appropriate position. Both lungs are clear. Multiple old right rib fracture deformities are again seen. IMPRESSION: No active cardiopulmonary disease. Electronically Signed   By: Danae Orleans M.D.   On: 10/15/2019 14:30   DG Shoulder Left Port  Result Date: 10/19/2019 CLINICAL DATA:  Postop left shoulder EXAM: LEFT SHOULDER COMPARISON:  None. FINDINGS: Left shoulder arthroplasty in satisfactory position on this single view. Defibrillator pads overlying the left hemithorax. Left chest pacemaker, incompletely visualized. IMPRESSION: Left shoulder arthroplasty in satisfactory position on this single view. Electronically Signed   By: Charline Bills M.D.   On: 10/19/2019 11:03   CUP PACEART REMOTE DEVICE CHECK  Result Date: 11/04/2019 Scheduled remote reviewed.  Normal device function.  Noise reversion detected.  Corvue shows decreased impedance.  Sent to triage Next remote  91 days. Hassell Halim, RN, CCDS, [employer]  [place] EKG SITE RITE  Result Date: 11/12/2019 If [facility] image not attached, placement could not be confirmed due to current cardiac rhythm.   Disposition: Discharge disposition: 01-Home or Self Care       Discharge Instructions    Call MD / Call 911   Complete by: As directed    If you experience chest pain or shortness of breath, CALL 911 and be transported to the hospital emergency room.  If you develope a fever above 101 F, pus (white drainage) or increased drainage or redness at the wound, or calf pain, call your surgeon's office.   Diet Carb Modified   Complete by: As directed    Increase activity slowly as tolerated   Complete by: As directed     Follow-up with infectious disease in 2-3 weeks.  Follow-up Information    Jones Broom, MD. Schedule an appointment as soon as possible for a visit in 2 weeks.   Specialty: Orthopedic Surgery Contact information: [address] [place] [address] [phone]            Signed: Matthew Folks 11/13/2019, 6:30 PM

## 2019-11-13 NOTE — Progress Notes (Signed)
Dr. Luciana Axe paged about iv antibiotics orders for pt to go home potentially today. Rn awaiting call back.

## 2019-11-13 NOTE — Progress Notes (Signed)
PHARMACY CONSULT NOTE FOR:  OUTPATIENT  PARENTERAL ANTIBIOTIC THERAPY (OPAT)  Indication: PJI Regimen: ceftriaxone 2gm IV q24h and vancomycin 1250mg  IV q12h End date: 12/22/2019  IV antibiotic discharge orders are pended. To discharging provider:  please sign these orders via discharge navigator,  Select New Orders & click on the button choice - Manage This Unsigned Work.     Thank you for allowing pharmacy to be a part of this patient's care.  02/21/2020 RPh 11/13/2019, 2:33 PM

## 2019-11-15 ENCOUNTER — Ambulatory Visit (HOSPITAL_COMMUNITY)
Admission: RE | Admit: 2019-11-15 | Discharge: 2019-11-15 | Disposition: A | Discharge status: Home / Self Care | Payer: 59 | Source: Ambulatory Visit | Attending: Cardiology | Admitting: Cardiology

## 2019-11-15 ENCOUNTER — Other Ambulatory Visit: Payer: Self-pay

## 2019-11-15 ENCOUNTER — Encounter (HOSPITAL_COMMUNITY): Payer: Self-pay | Admitting: Cardiology

## 2019-11-15 VITALS — HR 66 | Wt 254.4 lb

## 2019-11-15 DIAGNOSIS — Z7901 Long term (current) use of anticoagulants: Secondary | ICD-10-CM | POA: Diagnosis not present

## 2019-11-15 DIAGNOSIS — I428 Other cardiomyopathies: Secondary | ICD-10-CM | POA: Diagnosis not present

## 2019-11-15 DIAGNOSIS — F419 Anxiety disorder, unspecified: Secondary | ICD-10-CM | POA: Diagnosis not present

## 2019-11-15 DIAGNOSIS — I5042 Chronic combined systolic (congestive) and diastolic (congestive) heart failure: Secondary | ICD-10-CM | POA: Diagnosis not present

## 2019-11-15 DIAGNOSIS — G4733 Obstructive sleep apnea (adult) (pediatric): Secondary | ICD-10-CM | POA: Insufficient documentation

## 2019-11-15 DIAGNOSIS — F329 Major depressive disorder, single episode, unspecified: Secondary | ICD-10-CM | POA: Insufficient documentation

## 2019-11-15 DIAGNOSIS — I5021 Acute systolic (congestive) heart failure: Secondary | ICD-10-CM | POA: Diagnosis not present

## 2019-11-15 DIAGNOSIS — F1010 Alcohol abuse, uncomplicated: Secondary | ICD-10-CM | POA: Diagnosis not present

## 2019-11-15 DIAGNOSIS — I5082 Biventricular heart failure: Secondary | ICD-10-CM | POA: Diagnosis not present

## 2019-11-15 DIAGNOSIS — I4891 Unspecified atrial fibrillation: Secondary | ICD-10-CM | POA: Diagnosis not present

## 2019-11-15 DIAGNOSIS — Z7984 Long term (current) use of oral hypoglycemic drugs: Secondary | ICD-10-CM | POA: Insufficient documentation

## 2019-11-15 DIAGNOSIS — Z95810 Presence of automatic (implantable) cardiac defibrillator: Secondary | ICD-10-CM | POA: Insufficient documentation

## 2019-11-15 DIAGNOSIS — I5022 Chronic systolic (congestive) heart failure: Secondary | ICD-10-CM | POA: Diagnosis not present

## 2019-11-15 DIAGNOSIS — Z79899 Other long term (current) drug therapy: Secondary | ICD-10-CM | POA: Insufficient documentation

## 2019-11-15 DIAGNOSIS — I480 Paroxysmal atrial fibrillation: Secondary | ICD-10-CM | POA: Insufficient documentation

## 2019-11-15 MED ORDER — SACUBITRIL-VALSARTAN 97-103 MG PO TABS: 1.0000 | ORAL_TABLET | Freq: Two times a day (BID) | ORAL | 5 refills | Status: DC

## 2019-11-15 NOTE — Patient Instructions (Addendum)
INCREASE Entresto 97/103mg  (1 tab) twice a day  You have been referred to cardiac Rehab.  You will get a call to schedule this appointment.    Labs in 10 days We will only contact you if something comes back abnormal or we need to make some changes. Otherwise no news is good news!    Your physician recommends that you schedule a follow-up appointment in: 6 weeks with Dr Shirlee Latch.    Please call office at [phone] option 2 if you have any questions or concerns.    At the [facility], you and your health needs are our priority. As part of our continuing mission to provide you with exceptional heart care, we have created designated Provider Care Teams. These Care Teams include your primary Cardiologist (physician) and Advanced Practice Providers (APPs- Physician Assistants and Nurse Practitioners) who all work together to provide you with the care you need, when you need it.   You may see any of the following providers on your designated Care Team at your next follow up: Marland Kitchen Dr Arvilla Meres . Dr Marca Ancona . Tonye Becket, NP . Robbie Lis, PA . Karle Plumber, PharmD   Please be sure to bring in all your medications bottles to every appointment.

## 2019-11-16 ENCOUNTER — Telehealth: Payer: Self-pay

## 2019-11-16 ENCOUNTER — Other Ambulatory Visit (HOSPITAL_COMMUNITY)
Admission: RE | Admit: 2019-11-16 | Discharge: 2019-11-16 | Disposition: A | Discharge status: Home / Self Care | Payer: 59 | Source: Other Acute Inpatient Hospital | Attending: Orthopedic Surgery | Admitting: Orthopedic Surgery

## 2019-11-16 ENCOUNTER — Telehealth: Payer: Self-pay | Admitting: Infectious Diseases

## 2019-11-16 DIAGNOSIS — M00121 Pneumococcal arthritis, right elbow: Secondary | ICD-10-CM | POA: Diagnosis present

## 2019-11-16 DIAGNOSIS — T8450XA Infection and inflammatory reaction due to unspecified internal joint prosthesis, initial encounter: Secondary | ICD-10-CM | POA: Insufficient documentation

## 2019-11-16 LAB — CBC WITH DIFFERENTIAL/PLATELET
Abs Immature Granulocytes: 0.04 10*3/uL (ref 0.00–0.07)
Basophils Absolute: 0 10*3/uL (ref 0.0–0.1)
Basophils Relative: 1 %
Eosinophils Absolute: 0.3 10*3/uL (ref 0.0–0.5)
Eosinophils Relative: 4 %
HCT: 33.2 % — ABNORMAL LOW (ref 39.0–52.0)
Hemoglobin: 10.4 g/dL — ABNORMAL LOW (ref 13.0–17.0)
Immature Granulocytes: 1 %
Lymphocytes Relative: 14 %
Lymphs Abs: 1 10*3/uL (ref 0.7–4.0)
MCH: 33.1 pg (ref 26.0–34.0)
MCHC: 31.3 g/dL (ref 30.0–36.0)
MCV: 105.7 fL — ABNORMAL HIGH (ref 80.0–100.0)
Monocytes Absolute: 1.1 10*3/uL — ABNORMAL HIGH (ref 0.1–1.0)
Monocytes Relative: 15 %
Neutro Abs: 4.8 10*3/uL (ref 1.7–7.7)
Neutrophils Relative %: 65 %
Platelets: 261 10*3/uL (ref 150–400)
RBC: 3.14 MIL/uL — ABNORMAL LOW (ref 4.22–5.81)
RDW: 13.2 % (ref 11.5–15.5)
WBC: 7.1 10*3/uL (ref 4.0–10.5)
nRBC: 0 % (ref 0.0–0.2)

## 2019-11-16 LAB — C-REACTIVE PROTEIN: CRP: 3.3 mg/dL — ABNORMAL HIGH (ref <1.0)

## 2019-11-16 LAB — AEROBIC/ANAEROBIC CULTURE W GRAM STAIN (SURGICAL/DEEP WOUND)

## 2019-11-16 LAB — VANCOMYCIN, TROUGH: Vancomycin Tr: 15 ug/mL (ref 15–20)

## 2019-11-16 LAB — BASIC METABOLIC PANEL
Anion gap: 10 (ref 5–15)
BUN: 14 mg/dL (ref 6–20)
CO2: 26 mmol/L (ref 22–32)
Calcium: 9 mg/dL (ref 8.9–10.3)
Chloride: 101 mmol/L (ref 98–111)
Creatinine, Ser: 0.92 mg/dL (ref 0.61–1.24)
GFR calc Af Amer: >60 mL/min (ref >60)
GFR calc non Af Amer: >60 mL/min (ref >60)
Glucose, Bld: 116 mg/dL — ABNORMAL HIGH (ref 70–99)
Potassium: 4.2 mmol/L (ref 3.5–5.1)
Sodium: 137 mmol/L (ref 135–145)

## 2019-11-16 LAB — SEDIMENTATION RATE: Sed Rate: 50 mm/hr — ABNORMAL HIGH (ref 0–16)

## 2019-11-16 NOTE — Progress Notes (Signed)
PCP: Dr. Nolen Mu Cardiology: Dr. Allyson Sabal HF Cardiology: Dr. Shirlee Latch  59 y.o. with history of nonischemic cardiomyopathy and OSA was referred by Dr. Allyson Sabal for evaluation of CHF.  He was initially diagnosed in early 2/18.  He had URI symptoms then developed dyspnea and put on weight. Echo was done in 2/18, showing EF 20-25%.  LHC in 2/18 showed normal coronaries. He was started on treatment for cardiomyopathy, and EF rose to 40% by 6/18. However, echo in 6/19 showed EF back down to 15-20% with severe RV dilation/dysfunction.  CPX was done in 7/19, showed moderate functional limitation though submaximal.  He had St Jude ICD placed in 8/19.   Atrial fibrillation noted in 12/20, he had TEE-guided DCCV in 12/20.  He remains in NSR today and has not felt palpitations.  EF on TEE was 25-30%.   He has been a heavy drinker but has mostly stopped since his shoulder surgery.   He had left shoulder replacement in 2/21.  He has developed a surgical site infection and is on vancomycin via PICC line.   Patient returns for followup of CHF.  He is in NSR.  His shoulder feels much better, even with infection present. No significant exertional dyspnea.  No chest pain. No orthopnea/PND.  No palpitations.  No lightheadedness.   St Jude device interrogated: No atrial fibrillation, no VT, thoracic impedance not suggestive of volume overload.   ECG (personally reviewed): NSR with PVC  Labs (7/19): K 4.9, creatinine 0.91 Labs (8/19): Na 128, K 4.9, creatinine 0.72 Labs (9/19): LDL 45 Labs (12/19): K 3.9, creatinine 0.94  Labs (1/20): K 4.5, creatinine 0.94, hgb 13.2 Labs (10/20): K 4.4, creatinine 0.99 Labs (12/20): K 4.6, creatinine 1.0 Labs (2/21): K 3.9, creatinine 0.86  PMH: 1. OSA: uses CPAP.  2. Depression 3. Chronic systolic CHF: Nonischemic cardiomyopathy.  Diagnosed around 2/18.  - LHC (2/18): Normal coronaries.  - Echo (2/18): EF 20-25% - Echo (6/18): EF 40% - Echo (12/18): EF 35-40% - Echo (6/19):  LV mildly dilated, EF 15-20%, RV severely dilated with severely decreased systolic function.  - CPX (7/19): peak VO2 16, VE/VCO2 slope 37, RER 1.03 => submaximal but probably moderate functional limitation.  - Cardiac MRI (7/19): EF 29% with mild LV dilation, mildly decreased RV systolic function EF 40%, mid-wall LGE throughtout the septal wall and the inferor wall.  - St Jude ICD placed in 8/19.  - Echo (12/19): EF 25-30%, diffuse hypokinesis, normal RV size and systolic function.  - Echo (12/20): EF 25-30%, moderate LVH, normal RV size and systolic function.  - TEE (12/20): EF 25-30%, mildly decreased RV systolic function.  4. ETOH abuse.  5. Atrial fibrillation: First noted in 12/20.  - DCCV to NSR in 12/20.  6. 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 No drugs. Lives in [place].   FH: Sister with congenital heart abnormality.  No other cardiac disease.   ROS: All systems reviewed and negative except as per HPI.  Current Outpatient Medications  Medication Sig Dispense Refill  . apixaban (ELIQUIS) 5 MG TABS tablet Take 1 tablet (5 mg total) by mouth 2 (two) times daily. 60 tablet 6  . carvedilol (COREG) 12.5 MG tablet Take 1 tablet (12.5 mg total) by mouth 2 (two) times daily with a meal. 60 tablet 6  . cefTRIAXone 2 g in sodium chloride 0.9 % 100 mL Inject 2 g into the vein daily for  38 doses. 38 Dose 0  . dapagliflozin propanediol (FARXIGA) 10 MG TABS tablet Take 10 mg by mouth daily before breakfast. (Patient taking differently: Take 10 mg by mouth daily. ) 30 tablet 11  . furosemide (LASIX) 40 MG tablet Take 1 tablet (40 mg total) by mouth daily. 30 tablet 5  . LORazepam (ATIVAN) 1 MG tablet Take 0.5-1 mg by mouth every 8 (eight) hours as needed for anxiety.    . Multiple Vitamins-Minerals (MULTIVITAMIN WITH MINERALS) tablet Take 1 tablet by mouth daily.    Marland Kitchen oxyCODONE-acetaminophen (PERCOCET) 5-325 MG  tablet Take 1-2 tablets by mouth every 4 (four) hours as needed for severe pain. Take 1-2 tablets every 4 hours as needed for post operative pain. MAX 6/day 30 tablet 0  . potassium chloride SA (KLOR-CON M20) 20 MEQ tablet Take 1 tablet (20 mEq total) by mouth daily. Take 1 tablet by mouth daily. 90 tablet 3  . rosuvastatin (CRESTOR) 20 MG tablet TAKE 1 TABLET BY MOUTH EVERY DAY 90 tablet 3  . sertraline (ZOLOFT) 50 MG tablet TAKE 1 TABLET BY MOUTH EVERY DAY 90 tablet 0  . sildenafil (VIAGRA) 100 MG tablet Take 0.5 tablets (50 mg total) by mouth at bedtime as needed for erectile dysfunction. 10 tablet 3  . spironolactone (ALDACTONE) 25 MG tablet TAKE 1 TABLET BY MOUTH EVERY DAY (Patient taking differently: Take 25 mg by mouth daily. ) 90 tablet 3  . tiZANidine (ZANAFLEX) 4 MG tablet Take 1 tablet (4 mg total) by mouth every 8 (eight) hours as needed for muscle spasms. 30 tablet 1  . vancomycin (VANCOREADY) 1250 MG/250ML SOLN Inject 250 mLs (1,250 mg total) into the vein every 12 (twelve) hours for 38 doses. 9500 mL 0  . sacubitril-valsartan (ENTRESTO) 97-103 MG Take 1 tablet by mouth 2 (two) times daily. 60 tablet 5   No current facility-administered medications for this encounter.   Pulse 66   Wt 115.4 kg (254 lb 6.4 oz)   SpO2 97%   BMI 35.48 kg/m  General: NAD Neck: No JVD, no thyromegaly or thyroid nodule.  Lungs: Clear to auscultation bilaterally with normal respiratory effort. CV: Nondisplaced PMI.  Heart regular S1/S2, no S3/S4, no murmur.  No peripheral edema.  No carotid bruit.  Normal pedal pulses.  Abdomen: Soft, nontender, no hepatosplenomegaly, no distention.  Skin: Intact without lesions or rashes.  Neurologic: Alert and oriented x 3.  Psych: Normal affect. Extremities: No clubbing or cyanosis.  HEENT: Normal.   Assessment/Plan: 1. Chronic systolic CHF: Nonischemic cardiomyopathy with biventricular failure.  Cath in 2/18 with no coronary disease.  Echo (6/19) with EF  15-20%, severe RV dilation/dysfunction.  CPX 7/19 submaximal but probably moderate functional limitation.  Cardiac MRI in 7/19 showed EF 29%.  LGE pattern was concerning for prior viral myocarditis. Echo in 12/20 showed stable EF 25-30%.  Now with St Jude ICD.  Cause of CMP is uncertain.  However, he had been drinking heavily recently which may have worsened his LV function.  No family history of cardiomyopathy.  NYHA class II symptoms. Not volume overloaded on exam or Corvue.  - Continue dapagliflozin 10 mg daily.  - Continue spironolactone 25 mg daily.   - Continue Coreg 12.5 mg bid.  - Increase Entresto to 97/103 bid with BMET 10 days.     - Continue Lasix 40 mg daily - Close followup, may need advanced therapies down the road. - He has mostly stopped drinking ETOH.   - I would like to  see him more active, refer to cardiac rehab.  2. OSA: Continue CPAP.  3. Anxiety: He is on sertraline.  4. ETOH Abuse: This is likely contributing to his cardiomyopathy and likely played a role in triggering atrial fibrillation.  He has now quit.  He is concerned about going into a rehab facility because of his work.  5. Atrial fibrillation: Paroxysmal, remains in NSR after DCCV in 12/20.  - Continue Eliquis 5 mg bid.   - Stay off ETOH.   - If atrial fibrillation returns off ETOH, consider ablation.   Followup in 6 wks.    Loralie Champagne 11/16/2019

## 2019-11-16 NOTE — Telephone Encounter (Signed)
Per Dr. Ninetta Lights, ok to discontinue vanc and patient is only to receive ceftriaxone via IVPB. Verbal orders given to Amy at [facility]. Patient notified.   Abeer Deskins Loyola Mast, RN

## 2019-11-16 NOTE — Telephone Encounter (Signed)
Called pt and let him know results of hix Cx- P acnes.  Will change him to ceftriaxone alone, stop vanco.  Explained to pt and his girlfriend.  Will let home health know.

## 2019-11-22 ENCOUNTER — Other Ambulatory Visit (HOSPITAL_COMMUNITY)
Admit: 2019-11-22 | Discharge: 2019-11-22 | Disposition: A | Discharge status: Home / Self Care | Payer: 59 | Source: Ambulatory Visit | Attending: Orthopedic Surgery | Admitting: Orthopedic Surgery

## 2019-11-22 DIAGNOSIS — T8450XA Infection and inflammatory reaction due to unspecified internal joint prosthesis, initial encounter: Secondary | ICD-10-CM | POA: Insufficient documentation

## 2019-11-22 LAB — CBC WITH DIFFERENTIAL/PLATELET
Abs Immature Granulocytes: 0.05 K/uL (ref 0.00–0.07)
Basophils Absolute: 0.1 K/uL (ref 0.0–0.1)
Basophils Relative: 1 %
Eosinophils Absolute: 0.4 K/uL (ref 0.0–0.5)
Eosinophils Relative: 5 %
HCT: 37.9 % — ABNORMAL LOW (ref 39.0–52.0)
Hemoglobin: 11.7 g/dL — ABNORMAL LOW (ref 13.0–17.0)
Immature Granulocytes: 1 %
Lymphocytes Relative: 14 %
Lymphs Abs: 1.2 K/uL (ref 0.7–4.0)
MCH: 32.1 pg (ref 26.0–34.0)
MCHC: 30.9 g/dL (ref 30.0–36.0)
MCV: 103.8 fL — ABNORMAL HIGH (ref 80.0–100.0)
Monocytes Absolute: 0.8 K/uL (ref 0.1–1.0)
Monocytes Relative: 10 %
Neutro Abs: 5.6 K/uL (ref 1.7–7.7)
Neutrophils Relative %: 69 %
Platelets: 337 K/uL (ref 150–400)
RBC: 3.65 MIL/uL — ABNORMAL LOW (ref 4.22–5.81)
RDW: 12.7 % (ref 11.5–15.5)
WBC: 8 K/uL (ref 4.0–10.5)
nRBC: 0 % (ref 0.0–0.2)

## 2019-11-22 LAB — COMPREHENSIVE METABOLIC PANEL WITH GFR
ALT: 19 U/L (ref 0–44)
AST: 18 U/L (ref 15–41)
Albumin: 3.9 g/dL (ref 3.5–5.0)
Alkaline Phosphatase: 74 U/L (ref 38–126)
Anion gap: 10 (ref 5–15)
BUN: 14 mg/dL (ref 6–20)
CO2: 26 mmol/L (ref 22–32)
Calcium: 9.5 mg/dL (ref 8.9–10.3)
Chloride: 100 mmol/L (ref 98–111)
Creatinine, Ser: 0.78 mg/dL (ref 0.61–1.24)
GFR calc Af Amer: >60 mL/min
GFR calc non Af Amer: >60 mL/min
Glucose, Bld: 104 mg/dL — ABNORMAL HIGH (ref 70–99)
Potassium: 4.6 mmol/L (ref 3.5–5.1)
Sodium: 136 mmol/L (ref 135–145)
Total Bilirubin: 0.2 mg/dL — ABNORMAL LOW (ref 0.3–1.2)
Total Protein: 7 g/dL (ref 6.5–8.1)

## 2019-11-22 LAB — C-REACTIVE PROTEIN: CRP: 1.6 mg/dL — ABNORMAL HIGH

## 2019-11-22 LAB — SEDIMENTATION RATE: Sed Rate: 58 mm/hr — ABNORMAL HIGH (ref 0–16)

## 2019-11-25 ENCOUNTER — Other Ambulatory Visit (HOSPITAL_COMMUNITY): Payer: 59

## 2019-11-26 ENCOUNTER — Other Ambulatory Visit (HOSPITAL_COMMUNITY): Payer: 59

## 2019-11-29 ENCOUNTER — Other Ambulatory Visit (HOSPITAL_COMMUNITY): Payer: 59

## 2019-11-29 ENCOUNTER — Other Ambulatory Visit (HOSPITAL_COMMUNITY)
Admission: RE | Admit: 2019-11-29 | Discharge: 2019-11-29 | Disposition: A | Discharge status: Home / Self Care | Payer: 59 | Source: Other Acute Inpatient Hospital | Attending: Orthopedic Surgery | Admitting: Orthopedic Surgery

## 2019-11-29 DIAGNOSIS — T8450XA Infection and inflammatory reaction due to unspecified internal joint prosthesis, initial encounter: Secondary | ICD-10-CM | POA: Diagnosis present

## 2019-11-29 DIAGNOSIS — M00129 Pneumococcal arthritis, unspecified elbow: Secondary | ICD-10-CM | POA: Diagnosis present

## 2019-11-29 LAB — C-REACTIVE PROTEIN: CRP: 1.3 mg/dL — ABNORMAL HIGH (ref <1.0)

## 2019-11-29 LAB — COMPREHENSIVE METABOLIC PANEL
ALT: 24 U/L (ref 0–44)
AST: 28 U/L (ref 15–41)
Albumin: 4.1 g/dL (ref 3.5–5.0)
Alkaline Phosphatase: 72 U/L (ref 38–126)
Anion gap: 14 (ref 5–15)
BUN: 16 mg/dL (ref 6–20)
CO2: 26 mmol/L (ref 22–32)
Calcium: 9.4 mg/dL (ref 8.9–10.3)
Chloride: 95 mmol/L — ABNORMAL LOW (ref 98–111)
Creatinine, Ser: 1.03 mg/dL (ref 0.61–1.24)
GFR calc Af Amer: >60 mL/min (ref >60)
GFR calc non Af Amer: >60 mL/min (ref >60)
Glucose, Bld: 99 mg/dL (ref 70–99)
Potassium: 4.8 mmol/L (ref 3.5–5.1)
Sodium: 135 mmol/L (ref 135–145)
Total Bilirubin: 0.4 mg/dL (ref 0.3–1.2)
Total Protein: 7 g/dL (ref 6.5–8.1)

## 2019-11-29 LAB — SEDIMENTATION RATE: Sed Rate: 25 mm/hr — ABNORMAL HIGH (ref 0–16)

## 2019-11-29 LAB — CBC WITH DIFFERENTIAL/PLATELET
Abs Immature Granulocytes: 0.06 10*3/uL (ref 0.00–0.07)
Basophils Absolute: 0.2 10*3/uL — ABNORMAL HIGH (ref 0.0–0.1)
Basophils Relative: 2 %
Eosinophils Absolute: 0.4 10*3/uL (ref 0.0–0.5)
Eosinophils Relative: 4 %
HCT: 37.4 % — ABNORMAL LOW (ref 39.0–52.0)
Hemoglobin: 11.7 g/dL — ABNORMAL LOW (ref 13.0–17.0)
Immature Granulocytes: 1 %
Lymphocytes Relative: 12 %
Lymphs Abs: 1.1 10*3/uL (ref 0.7–4.0)
MCH: 32 pg (ref 26.0–34.0)
MCHC: 31.3 g/dL (ref 30.0–36.0)
MCV: 102.2 fL — ABNORMAL HIGH (ref 80.0–100.0)
Monocytes Absolute: 1 10*3/uL (ref 0.1–1.0)
Monocytes Relative: 11 %
Neutro Abs: 6.3 10*3/uL (ref 1.7–7.7)
Neutrophils Relative %: 70 %
Platelets: 313 10*3/uL (ref 150–400)
RBC: 3.66 MIL/uL — ABNORMAL LOW (ref 4.22–5.81)
RDW: 13.2 % (ref 11.5–15.5)
WBC: 9 10*3/uL (ref 4.0–10.5)
nRBC: 0 % (ref 0.0–0.2)

## 2019-12-01 ENCOUNTER — Other Ambulatory Visit (HOSPITAL_COMMUNITY): Payer: 59

## 2019-12-01 ENCOUNTER — Other Ambulatory Visit: Payer: Self-pay

## 2019-12-01 ENCOUNTER — Ambulatory Visit (HOSPITAL_COMMUNITY)
Admission: RE | Admit: 2019-12-01 | Discharge: 2019-12-01 | Disposition: A | Discharge status: Home / Self Care | Payer: 59 | Source: Ambulatory Visit | Attending: Internal Medicine | Admitting: Internal Medicine

## 2019-12-01 DIAGNOSIS — I5021 Acute systolic (congestive) heart failure: Secondary | ICD-10-CM | POA: Diagnosis not present

## 2019-12-01 LAB — BASIC METABOLIC PANEL
Anion gap: 11 (ref 5–15)
BUN: 13 mg/dL (ref 6–20)
CO2: 25 mmol/L (ref 22–32)
Calcium: 9.7 mg/dL (ref 8.9–10.3)
Chloride: 100 mmol/L (ref 98–111)
Creatinine, Ser: 1.04 mg/dL (ref 0.61–1.24)
GFR calc Af Amer: >60 mL/min (ref >60)
GFR calc non Af Amer: >60 mL/min (ref >60)
Glucose, Bld: 126 mg/dL — ABNORMAL HIGH (ref 70–99)
Potassium: 4.9 mmol/L (ref 3.5–5.1)
Sodium: 136 mmol/L (ref 135–145)

## 2019-12-06 ENCOUNTER — Other Ambulatory Visit: Payer: Self-pay

## 2019-12-06 ENCOUNTER — Telehealth: Payer: Self-pay

## 2019-12-06 ENCOUNTER — Ambulatory Visit (INDEPENDENT_AMBULATORY_CARE_PROVIDER_SITE_OTHER): Payer: 59 | Admitting: Internal Medicine

## 2019-12-06 ENCOUNTER — Encounter: Payer: Self-pay | Admitting: Internal Medicine

## 2019-12-06 VITALS — BP 128/77 | HR 76 | Wt 258.0 lb

## 2019-12-06 DIAGNOSIS — Z452 Encounter for adjustment and management of vascular access device: Secondary | ICD-10-CM

## 2019-12-06 DIAGNOSIS — M00812 Arthritis due to other bacteria, left shoulder: Secondary | ICD-10-CM | POA: Diagnosis not present

## 2019-12-06 DIAGNOSIS — Z5181 Encounter for therapeutic drug level monitoring: Secondary | ICD-10-CM | POA: Diagnosis not present

## 2019-12-06 MED ORDER — DOXYCYCLINE HYCLATE 100 MG PO TABS: 100.0000 mg | ORAL_TABLET | Freq: Two times a day (BID) | ORAL | 2 refills | Status: DC

## 2019-12-06 NOTE — Telephone Encounter (Signed)
Per Dr. Luciana Axe, ok to pull PICC after last dose of IV abx. Gave verbal orders to Coretta @ [facility]. Orders confirmed and patient notified.   Lasonya Hubner Loyola Mast, RN

## 2019-12-06 NOTE — Assessment & Plan Note (Signed)
Wbc ok, creat good.  Will continue to monitor

## 2019-12-06 NOTE — Patient Instructions (Addendum)
You were seen for you left shoulder infection. Your should exam is good and with expected improvement Your lab values including the CRP and ESR (inflammatory markers) are near normal and consistent with improving infection Your culture grew a bacteria called Proprionibacteria acnes You should complete the 6 weeks of IV ceftriaxone through April 7 On April 8 you should start doxycycline 100 mg twice a day for 3 months I will see you in mid April

## 2019-12-06 NOTE — Assessment & Plan Note (Signed)
Working well, no issues.  Will have this removed by home health after his last dose.

## 2019-12-06 NOTE — Assessment & Plan Note (Signed)
Doing well, improving and near normal inflammatory markers.  On appriopriate antibiotics based on bacterial growth.  With the polyexchange, I favor continuing antibiotics after IV treatment completion and will use doxycycline for 3 months.  This was sent in to start on April 8th.  He will return 1-2 weeks after that.

## 2019-12-06 NOTE — Progress Notes (Signed)
   Subjective:    Patient ID: John [name], male    DOB: 10-15-1960, 59 y.o.   MRN: [id number]  HPI Here for hsfu. He has a history of left shoulder replacement by Dr. Tamera Punt in October 19 2019 due to post traumatic arthritis.  He had a history of left shoulder fracture while playing football with his family.  He unfortunately developed pain and swelling to his left shoulder and required operative debridement with polyexchange done by Dr. Tamera Punt in late February. CRP 1.3 (<1.0) and ESR 25.  His shoulder is improving and he has good movement.       Review of Systems  Constitutional: Negative for chills, fever and unexpected weight change.  Gastrointestinal: Negative for diarrhea and nausea.  Skin: Negative for rash.       Objective:   Physical Exam Constitutional:      Appearance: Normal appearance.  Musculoskeletal:     Comments: Left shoulder with a well-healed incision, no erythema, no edema, no warmth  Neurological:     General: No focal deficit present.     Mental Status: He is alert.  Psychiatric:        Mood and Affect: Mood normal.   SH: no tobacco        Assessment & Plan:

## 2019-12-07 ENCOUNTER — Other Ambulatory Visit
Admission: RE | Admit: 2019-12-07 | Discharge: 2019-12-07 | Disposition: A | Discharge status: Home / Self Care | Payer: 59 | Source: Ambulatory Visit | Attending: Infectious Diseases | Admitting: Infectious Diseases

## 2019-12-07 DIAGNOSIS — M00012 Staphylococcal arthritis, left shoulder: Secondary | ICD-10-CM | POA: Insufficient documentation

## 2019-12-07 DIAGNOSIS — T8450XA Infection and inflammatory reaction due to unspecified internal joint prosthesis, initial encounter: Secondary | ICD-10-CM | POA: Insufficient documentation

## 2019-12-07 DIAGNOSIS — Y831 Surgical operation with implant of artificial internal device as the cause of abnormal reaction of the patient, or of later complication, without mention of misadventure at the time of the procedure: Secondary | ICD-10-CM | POA: Insufficient documentation

## 2019-12-07 LAB — COMPREHENSIVE METABOLIC PANEL
ALT: 20 U/L (ref 0–44)
AST: 21 U/L (ref 15–41)
Albumin: 3.7 g/dL (ref 3.5–5.0)
Alkaline Phosphatase: 54 U/L (ref 38–126)
Anion gap: 12 (ref 5–15)
BUN: 11 mg/dL (ref 6–20)
CO2: 24 mmol/L (ref 22–32)
Calcium: 8.7 mg/dL — ABNORMAL LOW (ref 8.9–10.3)
Chloride: 98 mmol/L (ref 98–111)
Creatinine, Ser: 0.8 mg/dL (ref 0.61–1.24)
GFR calc Af Amer: >60 mL/min (ref >60)
GFR calc non Af Amer: >60 mL/min (ref >60)
Glucose, Bld: 99 mg/dL (ref 70–99)
Potassium: 4.1 mmol/L (ref 3.5–5.1)
Sodium: 134 mmol/L — ABNORMAL LOW (ref 135–145)
Total Bilirubin: 0.4 mg/dL (ref 0.3–1.2)
Total Protein: 6.5 g/dL (ref 6.5–8.1)

## 2019-12-07 LAB — CBC WITH DIFFERENTIAL/PLATELET
Abs Immature Granulocytes: 0.03 10*3/uL (ref 0.00–0.07)
Basophils Absolute: 0.1 10*3/uL (ref 0.0–0.1)
Basophils Relative: 1 %
Eosinophils Absolute: 0.5 10*3/uL (ref 0.0–0.5)
Eosinophils Relative: 6 %
HCT: 34 % — ABNORMAL LOW (ref 39.0–52.0)
Hemoglobin: 10.8 g/dL — ABNORMAL LOW (ref 13.0–17.0)
Immature Granulocytes: 0 %
Lymphocytes Relative: 17 %
Lymphs Abs: 1.4 10*3/uL (ref 0.7–4.0)
MCH: 31.8 pg (ref 26.0–34.0)
MCHC: 31.8 g/dL (ref 30.0–36.0)
MCV: 100 fL (ref 80.0–100.0)
Monocytes Absolute: 1 10*3/uL (ref 0.1–1.0)
Monocytes Relative: 12 %
Neutro Abs: 5.4 10*3/uL (ref 1.7–7.7)
Neutrophils Relative %: 64 %
Platelets: 229 10*3/uL (ref 150–400)
RBC: 3.4 MIL/uL — ABNORMAL LOW (ref 4.22–5.81)
RDW: 13.2 % (ref 11.5–15.5)
WBC: 8.5 10*3/uL (ref 4.0–10.5)
nRBC: 0 % (ref 0.0–0.2)

## 2019-12-07 LAB — C-REACTIVE PROTEIN: CRP: 4.3 mg/dL — ABNORMAL HIGH (ref <1.0)

## 2019-12-07 LAB — SEDIMENTATION RATE: Sed Rate: 56 mm/hr — ABNORMAL HIGH (ref 0–20)

## 2019-12-14 ENCOUNTER — Telehealth: Payer: Self-pay

## 2019-12-14 ENCOUNTER — Other Ambulatory Visit (HOSPITAL_COMMUNITY)
Admission: RE | Admit: 2019-12-14 | Discharge: 2019-12-14 | Disposition: A | Discharge status: Home / Self Care | Payer: 59 | Source: Other Acute Inpatient Hospital | Attending: Orthopedic Surgery | Admitting: Orthopedic Surgery

## 2019-12-14 DIAGNOSIS — T8450XA Infection and inflammatory reaction due to unspecified internal joint prosthesis, initial encounter: Secondary | ICD-10-CM | POA: Insufficient documentation

## 2019-12-14 DIAGNOSIS — Y831 Surgical operation with implant of artificial internal device as the cause of abnormal reaction of the patient, or of later complication, without mention of misadventure at the time of the procedure: Secondary | ICD-10-CM | POA: Insufficient documentation

## 2019-12-14 DIAGNOSIS — M00012 Staphylococcal arthritis, left shoulder: Secondary | ICD-10-CM | POA: Insufficient documentation

## 2019-12-14 LAB — COMPREHENSIVE METABOLIC PANEL
ALT: 24 U/L (ref 0–44)
AST: 23 U/L (ref 15–41)
Albumin: 3.8 g/dL (ref 3.5–5.0)
Alkaline Phosphatase: 62 U/L (ref 38–126)
Anion gap: 11 (ref 5–15)
BUN: 18 mg/dL (ref 6–20)
CO2: 26 mmol/L (ref 22–32)
Calcium: 9.4 mg/dL (ref 8.9–10.3)
Chloride: 99 mmol/L (ref 98–111)
Creatinine, Ser: 0.86 mg/dL (ref 0.61–1.24)
GFR calc Af Amer: >60 mL/min (ref >60)
GFR calc non Af Amer: >60 mL/min (ref >60)
Glucose, Bld: 89 mg/dL (ref 70–99)
Potassium: 4.9 mmol/L (ref 3.5–5.1)
Sodium: 136 mmol/L (ref 135–145)
Total Bilirubin: 0.5 mg/dL (ref 0.3–1.2)
Total Protein: 6.8 g/dL (ref 6.5–8.1)

## 2019-12-14 LAB — CBC WITH DIFFERENTIAL/PLATELET
Abs Immature Granulocytes: 0.03 10*3/uL (ref 0.00–0.07)
Basophils Absolute: 0.1 10*3/uL (ref 0.0–0.1)
Basophils Relative: 1 %
Eosinophils Absolute: 0.7 10*3/uL — ABNORMAL HIGH (ref 0.0–0.5)
Eosinophils Relative: 9 %
HCT: 38.7 % — ABNORMAL LOW (ref 39.0–52.0)
Hemoglobin: 11.9 g/dL — ABNORMAL LOW (ref 13.0–17.0)
Immature Granulocytes: 0 %
Lymphocytes Relative: 15 %
Lymphs Abs: 1.2 10*3/uL (ref 0.7–4.0)
MCH: 31.3 pg (ref 26.0–34.0)
MCHC: 30.7 g/dL (ref 30.0–36.0)
MCV: 101.8 fL — ABNORMAL HIGH (ref 80.0–100.0)
Monocytes Absolute: 1 10*3/uL (ref 0.1–1.0)
Monocytes Relative: 13 %
Neutro Abs: 4.8 10*3/uL (ref 1.7–7.7)
Neutrophils Relative %: 62 %
Platelets: 332 10*3/uL (ref 150–400)
RBC: 3.8 MIL/uL — ABNORMAL LOW (ref 4.22–5.81)
RDW: 13 % (ref 11.5–15.5)
WBC: 7.8 10*3/uL (ref 4.0–10.5)
nRBC: 0 % (ref 0.0–0.2)

## 2019-12-14 NOTE — Telephone Encounter (Signed)
Since patient was referred by Dr. Shirlee Latch I am willing to accept him.  CC- Dr. Shirlee Latch just as Lorain Childes

## 2019-12-14 NOTE — Telephone Encounter (Signed)
Patient called to schedule new patient appt. Patient states that he was referred to Dr. Durene Cal by Dr. Marca Ancona. Patient aware that Dr. Durene Cal is not accepting any new patients at this moment.Patient states that his current PCP is retiring and was told Dr. Durene Cal would be the prefect PCP for him. Patient would like for someone to return his call when its possible.

## 2019-12-14 NOTE — Telephone Encounter (Signed)
Please advise 

## 2019-12-15 NOTE — Telephone Encounter (Signed)
Thanks.  He is a great guy but has a lot of issues to deal with.

## 2019-12-15 NOTE — Telephone Encounter (Signed)
LVM for pt to call the office to schedule new patient appt.

## 2019-12-15 NOTE — Telephone Encounter (Signed)
Dr. Durene Cal is willing to accept pt.

## 2019-12-22 ENCOUNTER — Telehealth: Payer: Self-pay | Admitting: Infectious Diseases

## 2019-12-22 ENCOUNTER — Other Ambulatory Visit (HOSPITAL_COMMUNITY)
Admission: RE | Admit: 2019-12-22 | Discharge: 2019-12-22 | Disposition: A | Discharge status: Home / Self Care | Payer: 59 | Source: Other Acute Inpatient Hospital | Attending: Orthopedic Surgery | Admitting: Orthopedic Surgery

## 2019-12-22 DIAGNOSIS — M00012 Staphylococcal arthritis, left shoulder: Secondary | ICD-10-CM | POA: Diagnosis not present

## 2019-12-22 DIAGNOSIS — T8450XA Infection and inflammatory reaction due to unspecified internal joint prosthesis, initial encounter: Secondary | ICD-10-CM | POA: Diagnosis present

## 2019-12-22 DIAGNOSIS — X58XXXA Exposure to other specified factors, initial encounter: Secondary | ICD-10-CM | POA: Insufficient documentation

## 2019-12-22 LAB — COMPREHENSIVE METABOLIC PANEL
ALT: 23 U/L (ref 0–44)
AST: 23 U/L (ref 15–41)
Albumin: 3.8 g/dL (ref 3.5–5.0)
Alkaline Phosphatase: 55 U/L (ref 38–126)
Anion gap: 12 (ref 5–15)
BUN: 20 mg/dL (ref 6–20)
CO2: 23 mmol/L (ref 22–32)
Calcium: 9.7 mg/dL (ref 8.9–10.3)
Chloride: 100 mmol/L (ref 98–111)
Creatinine, Ser: 1.05 mg/dL (ref 0.61–1.24)
GFR calc Af Amer: >60 mL/min (ref >60)
GFR calc non Af Amer: >60 mL/min (ref >60)
Glucose, Bld: 111 mg/dL — ABNORMAL HIGH (ref 70–99)
Potassium: 4.9 mmol/L (ref 3.5–5.1)
Sodium: 135 mmol/L (ref 135–145)
Total Bilirubin: 0.6 mg/dL (ref 0.3–1.2)
Total Protein: 6.7 g/dL (ref 6.5–8.1)

## 2019-12-22 LAB — CBC WITH DIFFERENTIAL/PLATELET
Abs Immature Granulocytes: 0.03 10*3/uL (ref 0.00–0.07)
Basophils Absolute: 0.1 10*3/uL (ref 0.0–0.1)
Basophils Relative: 1 %
Eosinophils Absolute: 0.4 10*3/uL (ref 0.0–0.5)
Eosinophils Relative: 6 %
HCT: 40.6 % (ref 39.0–52.0)
Hemoglobin: 12.5 g/dL — ABNORMAL LOW (ref 13.0–17.0)
Immature Granulocytes: 0 %
Lymphocytes Relative: 18 %
Lymphs Abs: 1.4 10*3/uL (ref 0.7–4.0)
MCH: 31.2 pg (ref 26.0–34.0)
MCHC: 30.8 g/dL (ref 30.0–36.0)
MCV: 101.2 fL — ABNORMAL HIGH (ref 80.0–100.0)
Monocytes Absolute: 0.6 10*3/uL (ref 0.1–1.0)
Monocytes Relative: 9 %
Neutro Abs: 4.8 10*3/uL (ref 1.7–7.7)
Neutrophils Relative %: 66 %
Platelets: 326 10*3/uL (ref 150–400)
RBC: 4.01 MIL/uL — ABNORMAL LOW (ref 4.22–5.81)
RDW: 12.9 % (ref 11.5–15.5)
WBC: 7.3 10*3/uL (ref 4.0–10.5)
nRBC: 0 % (ref 0.0–0.2)

## 2019-12-22 LAB — C-REACTIVE PROTEIN: CRP: 1.6 mg/dL — ABNORMAL HIGH (ref <1.0)

## 2019-12-22 LAB — SEDIMENTATION RATE: Sed Rate: 46 mm/hr — ABNORMAL HIGH (ref 0–16)

## 2019-12-22 NOTE — Telephone Encounter (Signed)
OPAT review -  Patient completed his IV anabiotics with last dose today. I called to LVM and will send MyChart message to ensure he has his Doxycycline and scheduled a follow up appointment with Dr. Luciana Axe is arranged.

## 2019-12-23 NOTE — Telephone Encounter (Signed)
Patient called back requesting to speak with Judeth Cornfield. Advised that she is with patients this morning. Patient wanted to answer her questions in regards to medication. Patient does have his doxycycline but requested a call back from provider.   Keigo Whalley Loyola Mast, RN

## 2019-12-24 NOTE — Telephone Encounter (Signed)
Attempted to call back - left HIPPA compliant VM to call back so we can discuss his concerns.

## 2019-12-28 ENCOUNTER — Telehealth: Payer: Self-pay

## 2019-12-28 NOTE — Telephone Encounter (Signed)
Patient called office today for clearance regarding dental appointment. He is scheduled for this Thursday for non invasive dental cap replacement. Would like to know if appointment is fine or if he would need any antibiotics prescribed. Would also like to know if he should take any precautions.  John [name], [place]

## 2019-12-28 NOTE — Telephone Encounter (Signed)
No particular precautions and no antibiotics indicated for this.

## 2019-12-30 ENCOUNTER — Ambulatory Visit (HOSPITAL_COMMUNITY)
Admission: RE | Admit: 2019-12-30 | Discharge: 2019-12-30 | Disposition: A | Discharge status: Home / Self Care | Payer: 59 | Source: Ambulatory Visit | Attending: Cardiology | Admitting: Cardiology

## 2019-12-30 ENCOUNTER — Other Ambulatory Visit: Payer: Self-pay

## 2019-12-30 ENCOUNTER — Encounter (HOSPITAL_COMMUNITY): Payer: Self-pay | Admitting: Cardiology

## 2019-12-30 ENCOUNTER — Telehealth (HOSPITAL_COMMUNITY): Payer: Self-pay

## 2019-12-30 VITALS — BP 120/78 | HR 78 | Wt 253.6 lb

## 2019-12-30 DIAGNOSIS — Z95810 Presence of automatic (implantable) cardiac defibrillator: Secondary | ICD-10-CM | POA: Diagnosis not present

## 2019-12-30 DIAGNOSIS — F1011 Alcohol abuse, in remission: Secondary | ICD-10-CM | POA: Insufficient documentation

## 2019-12-30 DIAGNOSIS — I428 Other cardiomyopathies: Secondary | ICD-10-CM

## 2019-12-30 DIAGNOSIS — I5082 Biventricular heart failure: Secondary | ICD-10-CM | POA: Diagnosis not present

## 2019-12-30 DIAGNOSIS — Z7901 Long term (current) use of anticoagulants: Secondary | ICD-10-CM | POA: Diagnosis not present

## 2019-12-30 DIAGNOSIS — F419 Anxiety disorder, unspecified: Secondary | ICD-10-CM | POA: Insufficient documentation

## 2019-12-30 DIAGNOSIS — Z79899 Other long term (current) drug therapy: Secondary | ICD-10-CM | POA: Diagnosis not present

## 2019-12-30 DIAGNOSIS — G4733 Obstructive sleep apnea (adult) (pediatric): Secondary | ICD-10-CM | POA: Diagnosis not present

## 2019-12-30 DIAGNOSIS — F329 Major depressive disorder, single episode, unspecified: Secondary | ICD-10-CM | POA: Insufficient documentation

## 2019-12-30 DIAGNOSIS — I480 Paroxysmal atrial fibrillation: Secondary | ICD-10-CM | POA: Insufficient documentation

## 2019-12-30 DIAGNOSIS — Z7984 Long term (current) use of oral hypoglycemic drugs: Secondary | ICD-10-CM | POA: Insufficient documentation

## 2019-12-30 DIAGNOSIS — I5022 Chronic systolic (congestive) heart failure: Secondary | ICD-10-CM

## 2019-12-30 LAB — BASIC METABOLIC PANEL
Anion gap: 12 (ref 5–15)
BUN: 9 mg/dL (ref 6–20)
CO2: 24 mmol/L (ref 22–32)
Calcium: 9.7 mg/dL (ref 8.9–10.3)
Chloride: 102 mmol/L (ref 98–111)
Creatinine, Ser: 1.03 mg/dL (ref 0.61–1.24)
GFR calc Af Amer: >60 mL/min (ref >60)
GFR calc non Af Amer: >60 mL/min (ref >60)
Glucose, Bld: 127 mg/dL — ABNORMAL HIGH (ref 70–99)
Potassium: 5.1 mmol/L (ref 3.5–5.1)
Sodium: 138 mmol/L (ref 135–145)

## 2019-12-30 MED ORDER — CARVEDILOL 12.5 MG PO TABS: 18.7500 mg | ORAL_TABLET | Freq: Two times a day (BID) | ORAL | 6 refills | Status: DC

## 2019-12-30 NOTE — Patient Instructions (Addendum)
INCREASE Coreg to 18.75mg  (1.5 tabs) twice a day   You have been referred to Cardiac Rehab. They will call you to schedule this appointment.   Labs today We will only contact you if something comes back abnormal or we need to make some changes. Otherwise no news is good news!   Your physician recommends that you schedule a follow-up appointment in: 3 months with Dr Shirlee Latch  July Garage Code: 4009   Please call office at [phone] option [phone] if you have any questions or concerns.    At the [facility], you and your health needs are our priority. As part of our continuing mission to provide you with exceptional heart care, we have created designated Provider Care Teams. These Care Teams include your primary Cardiologist (physician) and Advanced Practice Providers (APPs- Physician Assistants and Nurse Practitioners) who all work together to provide you with the care you need, when you need it.   You may see any of the following providers on your designated Care Team at your next follow up: Marland Kitchen Dr Arvilla Meres . Dr Marca Ancona . Tonye Becket, NP . Robbie Lis, PA . Karle Plumber, PharmD   Please be sure to bring in all your medications bottles to every appointment.

## 2019-12-30 NOTE — Progress Notes (Signed)
PCP: Dr. Caprice Beaver Cardiology: Dr. Gwenlyn Found HF Cardiology: Dr. Aundra Dubin  59 y.o. with history of nonischemic cardiomyopathy and OSA was referred by Dr. Gwenlyn Found for evaluation of CHF.  He was initially diagnosed in early 2/18.  He had URI symptoms then developed dyspnea and put on weight. Echo was done in 2/18, showing EF 20-25%.  LHC in 2/18 showed normal coronaries. He was started on treatment for cardiomyopathy, and EF rose to 40% by 6/18. However, echo in 6/19 showed EF back down to 15-20% with severe RV dilation/dysfunction.  CPX was done in 7/19, showed moderate functional limitation though submaximal.  He had St Jude ICD placed in 8/19.   Atrial fibrillation noted in 12/20, he had TEE-guided DCCV in 12/20.  He remains in NSR today and has not felt palpitations.  EF on TEE was 25-30%.   He has been a heavy drinker but has mostly stopped since his shoulder surgery.   He had left shoulder replacement in 2/21.  He developed a surgical site infection and got IV vancomycin via PICC, now on doxycycline.    Patient returns for followup of CHF.  No shoulder pain.  No ETOH.  No dyspnea walking on flat ground though he does get short of breath walking up stairs.  No orthopnea/PND. No lightheadedness or palpitations.  No chest pain.    St Jude device interrogated: No atrial fibrillation, no VT, stable thoracic impedance.    Labs (7/19): K 4.9, creatinine 0.91 Labs (8/19): Na 128, K 4.9, creatinine 0.72 Labs (9/19): LDL 45 Labs (12/19): K 3.9, creatinine 0.94  Labs (1/20): K 4.5, creatinine 0.94, hgb 13.2 Labs (10/20): K 4.4, creatinine 0.99 Labs (12/20): K 4.6, creatinine 1.0 Labs (2/21): K 3.9, creatinine 0.86 Labs (4/21): K 4.9, creatinine 1.05  PMH: 1. OSA: uses CPAP.  2. Depression 3. Chronic systolic CHF: Nonischemic cardiomyopathy.  Diagnosed around 2/18.  - LHC (2/18): Normal coronaries.  - Echo (2/18): EF 20-25% - Echo (6/18): EF 40% - Echo (12/18): EF 35-40% - Echo (6/19): LV mildly  dilated, EF 15-20%, RV severely dilated with severely decreased systolic function.  - CPX (7/19): peak VO2 16, VE/VCO2 slope 37, RER 1.03 => submaximal but probably moderate functional limitation.  - Cardiac MRI (7/19): EF 29% with mild LV dilation, mildly decreased RV systolic function EF 14%, mid-wall LGE throughtout the septal wall and the inferor wall.  - St Jude ICD placed in 8/19.  - Echo (12/19): EF 25-30%, diffuse hypokinesis, normal RV size and systolic function.  - Echo (12/20): EF 25-30%, moderate LVH, normal RV size and systolic function.  - TEE (12/20): EF 25-30%, mildly decreased RV systolic function.  4. ETOH abuse.  5. Atrial fibrillation: First noted in 12/20.  - DCCV to NSR in 12/20.  6. Left shoulder OA  SH: Originally from [place], divorced with 3 kids, works in sales/logistics, never smoked, h/o ETOH abuse. No drugs. Lives in [place].   FH: Sister with congenital heart abnormality.  No other cardiac disease.   ROS: All systems reviewed and negative except as per HPI.  Current Outpatient Medications  Medication Sig Dispense Refill  . apixaban (ELIQUIS) 5 MG TABS tablet Take 1 tablet (5 mg total) by mouth 2 (two) times daily. 60 tablet 6  . carvedilol (COREG) 12.5 MG tablet Take 1.5 tablets (18.75 mg total) by mouth 2 (two) times daily with a meal. 90 tablet 6  . dapagliflozin propanediol (FARXIGA) 10 MG TABS tablet Take 10 mg by mouth daily.    Marland Kitchen  doxycycline (VIBRA-TABS) 100 MG tablet Take 1 tablet (100 mg total) by mouth 2 (two) times daily. 60 tablet 2  . furosemide (LASIX) 40 MG tablet Take 1 tablet (40 mg total) by mouth daily. 30 tablet 5  . LORazepam (ATIVAN) 1 MG tablet Take 0.5-1 mg by mouth every 8 (eight) hours as needed for anxiety.    . Multiple Vitamins-Minerals (MULTIVITAMIN WITH MINERALS) tablet Take 1 tablet by mouth daily.    . potassium chloride SA (KLOR-CON M20) 20 MEQ tablet Take 1 tablet (20 mEq total) by mouth daily. Take 1 tablet by mouth daily. 90  tablet 3  . rosuvastatin (CRESTOR) 20 MG tablet TAKE 1 TABLET BY MOUTH EVERY DAY 90 tablet 3  . sacubitril-valsartan (ENTRESTO) 97-103 MG Take 1 tablet by mouth 2 (two) times daily. 60 tablet 5  . sertraline (ZOLOFT) 50 MG tablet TAKE 1 TABLET BY MOUTH EVERY DAY 90 tablet 0  . sildenafil (VIAGRA) 100 MG tablet Take 0.5 tablets (50 mg total) by mouth at bedtime as needed for erectile dysfunction. 10 tablet 3  . spironolactone (ALDACTONE) 25 MG tablet Take 25 mg by mouth daily.     No current facility-administered medications for this encounter.   BP 120/78   Pulse 78   Wt 115 kg (253 lb 9.6 oz)   SpO2 98%   BMI 35.37 kg/m  General: NAD Neck: No JVD, no thyromegaly or thyroid nodule.  Lungs: Clear to auscultation bilaterally with normal respiratory effort. CV: Nondisplaced PMI.  Heart regular S1/S2, no S3/S4, no murmur.  No peripheral edema.  No carotid bruit.  Normal pedal pulses.  Abdomen: Soft, nontender, no hepatosplenomegaly, no distention.  Skin: Intact without lesions or rashes.  Neurologic: Alert and oriented x 3.  Psych: Normal affect. Extremities: No clubbing or cyanosis.  HEENT: Normal.   Assessment/Plan: 1. Chronic systolic CHF: Nonischemic cardiomyopathy with biventricular failure.  Cath in 2/18 with no coronary disease.  Echo (6/19) with EF 15-20%, severe RV dilation/dysfunction.  CPX 7/19 submaximal but probably moderate functional limitation.  Cardiac MRI in 7/19 showed EF 29%.  LGE pattern was concerning for prior viral myocarditis. Echo in 12/20 showed stable EF 25-30%.  Now with St Jude ICD.  Cause of CMP is uncertain.  However, he had been drinking heavily which may have worsened his LV function.  No family history of cardiomyopathy.  NYHA class II symptoms. Not volume overloaded on exam or Corvue.  - Continue dapagliflozin 10 mg daily.  - Continue spironolactone 25 mg daily.   - Increase Coreg to 18.75 mg bid. - Continue Entresto 97/103 bid.  - Continue Lasix 40 mg  daily, BMET today.  - Close followup, may need advanced therapies down the road. - He has mostly stopped drinking ETOH.   - I would like to see him more active, refer again to cardiac rehab now that his shoulder is doing better.  2. OSA: Continue CPAP.  3. Anxiety: He is on sertraline.  4. ETOH Abuse: This is likely contributing to his cardiomyopathy and likely played a role in triggering atrial fibrillation.  He has now quit.   5. Atrial fibrillation: Paroxysmal, remains in NSR after DCCV in 12/20.  - Continue Eliquis 5 mg bid.   - Stay off ETOH.   - If atrial fibrillation returns off ETOH, consider ablation.   Followup in 3 months.    Marca Ancona 12/30/2019

## 2019-12-30 NOTE — Telephone Encounter (Signed)
Called pt insurance [employer] to check benefits for cardiac rehab, they stated that pt insurance terminated on 12/28/2019 and that he was no longer being covered. Called pt to advise him of this information. Pt stated that he no longer works for the company anymore but they was suppose to cover him for another year, advise pt that he needs to call them and he stated that he had an appt with his attorney. I advised pt that I would contact him after his f/u on 01/18/2020.

## 2019-12-31 ENCOUNTER — Telehealth (HOSPITAL_COMMUNITY): Payer: Self-pay

## 2019-12-31 NOTE — Telephone Encounter (Signed)
-----   Message from Laurey Morale, MD sent at 12/30/2019  9:58 PM EDT ----- Stop KCl.

## 2019-12-31 NOTE — Telephone Encounter (Signed)
Pt aware of results and to stop potassium. Verbalized understanding.

## 2020-01-18 ENCOUNTER — Ambulatory Visit: Payer: 59 | Admitting: Internal Medicine

## 2020-01-18 ENCOUNTER — Ambulatory Visit: Payer: Self-pay | Admitting: Internal Medicine

## 2020-01-20 ENCOUNTER — Ambulatory Visit (INDEPENDENT_AMBULATORY_CARE_PROVIDER_SITE_OTHER): Payer: 59 | Admitting: Internal Medicine

## 2020-01-20 ENCOUNTER — Other Ambulatory Visit: Payer: Self-pay

## 2020-01-20 ENCOUNTER — Encounter: Payer: Self-pay | Admitting: Internal Medicine

## 2020-01-20 VITALS — BP 106/68 | HR 65 | Wt 252.0 lb

## 2020-01-20 DIAGNOSIS — Z5181 Encounter for therapeutic drug level monitoring: Secondary | ICD-10-CM | POA: Diagnosis not present

## 2020-01-20 DIAGNOSIS — M00812 Arthritis due to other bacteria, left shoulder: Secondary | ICD-10-CM

## 2020-01-20 DIAGNOSIS — Z452 Encounter for adjustment and management of vascular access device: Secondary | ICD-10-CM

## 2020-01-20 NOTE — Assessment & Plan Note (Signed)
No issues with the picc and has been removed.

## 2020-01-20 NOTE — Assessment & Plan Note (Signed)
Will check cbc, bmp. Side effects discussed including photosensitive rash and need for sunblock.

## 2020-01-20 NOTE — Assessment & Plan Note (Signed)
Seems to be very well-healed.  On doxycycline for continuation for three months through early July.  Will check inflammatory markers to assure they have remained flat.  rtc 6 weeks for a final visit and will recheck inflammatory markers then

## 2020-01-20 NOTE — Progress Notes (Signed)
   Subjective:    Patient ID: John [name], male    DOB: 02/24/1961, 59 y.o.   MRN: [id number]  HPI Here for hsfu. He has a history of left shoulder replacement by Dr. Ave Filter in October 19 2019 due to post traumatic arthritis.  He had a history of left shoulder fracture while playing football with his family.  He unfortunately developed pain and swelling to his left shoulder with infection and required operative debridement with polyexchange done by Dr. Ave Filter in late February. He completed 6 weeks of IV therapy and continues on doxycycline continuation for 3 months.  He continues to have improved movement of his shoulder.  Tolerating doxycycline.  No associated rash or diarrhea.    Review of Systems  Constitutional: Negative for chills, fever and unexpected weight change.  Gastrointestinal: Negative for diarrhea and nausea.  Skin: Negative for rash.       Objective:   Physical Exam Constitutional:      Appearance: Normal appearance.  Eyes:     General: No scleral icterus. Musculoskeletal:        General: Normal range of motion.     Comments: Left shoulder with a well-healed incision, no erythema, no edema, no warmth  Neurological:     General: No focal deficit present.     Mental Status: He is alert.  Psychiatric:        Mood and Affect: Mood normal.   SH: no tobacco        Assessment & Plan:

## 2020-01-21 LAB — BASIC METABOLIC PANEL
BUN: 13 mg/dL (ref 7–25)
CO2: 26 mmol/L (ref 20–32)
Calcium: 9.5 mg/dL (ref 8.6–10.3)
Chloride: 97 mmol/L — ABNORMAL LOW (ref 98–110)
Creat: 0.94 mg/dL (ref 0.70–1.33)
Glucose, Bld: 103 mg/dL — ABNORMAL HIGH (ref 65–99)
Potassium: 4.7 mmol/L (ref 3.5–5.3)
Sodium: 133 mmol/L — ABNORMAL LOW (ref 135–146)

## 2020-01-21 LAB — C-REACTIVE PROTEIN: CRP: 10.2 mg/L — ABNORMAL HIGH (ref <8.0)

## 2020-01-21 LAB — SEDIMENTATION RATE: Sed Rate: 25 mm/h — ABNORMAL HIGH (ref 0–20)

## 2020-01-25 ENCOUNTER — Ambulatory Visit: Payer: 59 | Admitting: Family Medicine

## 2020-02-01 ENCOUNTER — Other Ambulatory Visit (HOSPITAL_COMMUNITY): Payer: Self-pay | Admitting: Cardiology

## 2020-02-03 ENCOUNTER — Ambulatory Visit (INDEPENDENT_AMBULATORY_CARE_PROVIDER_SITE_OTHER): Payer: 59 | Admitting: *Deleted

## 2020-02-03 ENCOUNTER — Other Ambulatory Visit (HOSPITAL_COMMUNITY): Payer: Self-pay | Admitting: Cardiology

## 2020-02-03 DIAGNOSIS — I428 Other cardiomyopathies: Secondary | ICD-10-CM | POA: Diagnosis not present

## 2020-02-03 LAB — CUP PACEART REMOTE DEVICE CHECK
Battery Remaining Longevity: 85 mo
Battery Remaining Percentage: 83 %
Battery Voltage: 3.02 V
Brady Statistic RV Percent Paced: 1 %
Date Time Interrogation Session: 20210520044507
HighPow Impedance: 86 Ohm
HighPow Impedance: 86 Ohm
Lead Channel Impedance Value: 450 Ohm
Lead Channel Pacing Threshold Amplitude: 0.75 V
Lead Channel Pacing Threshold Pulse Width: 0.5 ms
Lead Channel Sensing Intrinsic Amplitude: 12 mV
Lead Channel Setting Pacing Amplitude: 2.5 V
Lead Channel Setting Pacing Pulse Width: 0.5 ms
Lead Channel Setting Sensing Sensitivity: 0.5 mV
Pulse Gen Serial Number: 9824800

## 2020-02-07 NOTE — Progress Notes (Signed)
Remote ICD transmission.   

## 2020-02-24 ENCOUNTER — Other Ambulatory Visit (HOSPITAL_COMMUNITY): Payer: Self-pay | Admitting: Cardiology

## 2020-03-01 ENCOUNTER — Other Ambulatory Visit: Payer: Self-pay | Admitting: *Deleted

## 2020-03-01 ENCOUNTER — Ambulatory Visit: Payer: Self-pay | Admitting: Internal Medicine

## 2020-03-01 DIAGNOSIS — M00812 Arthritis due to other bacteria, left shoulder: Secondary | ICD-10-CM

## 2020-03-03 ENCOUNTER — Other Ambulatory Visit: Payer: 59

## 2020-03-08 ENCOUNTER — Telehealth (HOSPITAL_COMMUNITY): Payer: Self-pay

## 2020-03-08 ENCOUNTER — Encounter (HOSPITAL_COMMUNITY): Payer: Self-pay

## 2020-03-08 NOTE — Telephone Encounter (Signed)
Pt returned CR phone call and stated his insurance is back active and would like to schedule. Verify insurance benefits (see note below).  Attempted to contact pt back to go over benefits and schedule for CR, LMTCB.

## 2020-03-08 NOTE — Telephone Encounter (Signed)
Attempted to call patient in regards to Cardiac Rehab - LM on VM Mailed letter 

## 2020-03-09 ENCOUNTER — Other Ambulatory Visit: Payer: 59

## 2020-03-10 ENCOUNTER — Other Ambulatory Visit: Payer: 59

## 2020-03-14 ENCOUNTER — Other Ambulatory Visit: Payer: Self-pay

## 2020-03-14 ENCOUNTER — Other Ambulatory Visit: Payer: 59

## 2020-03-14 DIAGNOSIS — M00812 Arthritis due to other bacteria, left shoulder: Secondary | ICD-10-CM

## 2020-03-15 LAB — SEDIMENTATION RATE: Sed Rate: 17 mm/h (ref 0–20)

## 2020-03-15 LAB — BASIC METABOLIC PANEL
BUN: 13 mg/dL (ref 7–25)
CO2: 28 mmol/L (ref 20–32)
Calcium: 8.7 mg/dL (ref 8.6–10.3)
Chloride: 98 mmol/L (ref 98–110)
Creat: 0.94 mg/dL (ref 0.70–1.33)
Glucose, Bld: 123 mg/dL — ABNORMAL HIGH (ref 65–99)
Potassium: 4 mmol/L (ref 3.5–5.3)
Sodium: 135 mmol/L (ref 135–146)

## 2020-03-15 LAB — C-REACTIVE PROTEIN: CRP: 9.8 mg/L — ABNORMAL HIGH (ref <8.0)

## 2020-03-21 ENCOUNTER — Telehealth: Payer: Self-pay

## 2020-03-21 NOTE — Telephone Encounter (Signed)
Called patient regarding message from MD. Patient does not have any new concerns, and is okay with d/x doxy after his last dose. Would like to know if a follow up appointment is necessary. Would like to thank Dr. Luciana Axe for everything he has done. Lorenso Courier, [place]

## 2020-03-21 NOTE — Telephone Encounter (Signed)
-----  Message from Thayer Headings, MD sent at 03/20/2020  5:06 PM EDT ----- His ESR and CRP look reassurring and he can go ahead and finish what he has and then stop as discussed, unless he is having any new concerns. thanks

## 2020-03-22 NOTE — Telephone Encounter (Signed)
No, he does not need to come back unless he has concerns.  thanks

## 2020-03-27 ENCOUNTER — Telehealth (HOSPITAL_COMMUNITY): Payer: Self-pay | Admitting: Student-PharmD

## 2020-03-30 ENCOUNTER — Encounter (HOSPITAL_COMMUNITY): Payer: Self-pay | Admitting: Cardiology

## 2020-03-30 ENCOUNTER — Ambulatory Visit (HOSPITAL_COMMUNITY)
Admission: RE | Admit: 2020-03-30 | Discharge: 2020-03-30 | Disposition: A | Discharge status: Home / Self Care | Payer: 59 | Source: Ambulatory Visit | Attending: Cardiology | Admitting: Cardiology

## 2020-03-30 VITALS — BP 112/76 | HR 86 | Wt 258.0 lb

## 2020-03-30 DIAGNOSIS — Z95810 Presence of automatic (implantable) cardiac defibrillator: Secondary | ICD-10-CM | POA: Insufficient documentation

## 2020-03-30 DIAGNOSIS — Z79899 Other long term (current) drug therapy: Secondary | ICD-10-CM | POA: Diagnosis not present

## 2020-03-30 DIAGNOSIS — Z7984 Long term (current) use of oral hypoglycemic drugs: Secondary | ICD-10-CM | POA: Diagnosis not present

## 2020-03-30 DIAGNOSIS — Z8249 Family history of ischemic heart disease and other diseases of the circulatory system: Secondary | ICD-10-CM | POA: Diagnosis not present

## 2020-03-30 DIAGNOSIS — F1010 Alcohol abuse, uncomplicated: Secondary | ICD-10-CM | POA: Diagnosis not present

## 2020-03-30 DIAGNOSIS — F419 Anxiety disorder, unspecified: Secondary | ICD-10-CM | POA: Insufficient documentation

## 2020-03-30 DIAGNOSIS — I5082 Biventricular heart failure: Secondary | ICD-10-CM | POA: Diagnosis not present

## 2020-03-30 DIAGNOSIS — M19012 Primary osteoarthritis, left shoulder: Secondary | ICD-10-CM | POA: Diagnosis not present

## 2020-03-30 DIAGNOSIS — I5022 Chronic systolic (congestive) heart failure: Secondary | ICD-10-CM

## 2020-03-30 DIAGNOSIS — I480 Paroxysmal atrial fibrillation: Secondary | ICD-10-CM | POA: Insufficient documentation

## 2020-03-30 DIAGNOSIS — Z7901 Long term (current) use of anticoagulants: Secondary | ICD-10-CM | POA: Insufficient documentation

## 2020-03-30 DIAGNOSIS — G4733 Obstructive sleep apnea (adult) (pediatric): Secondary | ICD-10-CM | POA: Insufficient documentation

## 2020-03-30 DIAGNOSIS — I428 Other cardiomyopathies: Secondary | ICD-10-CM | POA: Diagnosis not present

## 2020-03-30 DIAGNOSIS — F329 Major depressive disorder, single episode, unspecified: Secondary | ICD-10-CM | POA: Diagnosis not present

## 2020-03-30 DIAGNOSIS — I429 Cardiomyopathy, unspecified: Secondary | ICD-10-CM | POA: Diagnosis not present

## 2020-03-30 LAB — BASIC METABOLIC PANEL
Anion gap: 14 (ref 5–15)
BUN: 12 mg/dL (ref 6–20)
CO2: 20 mmol/L — ABNORMAL LOW (ref 22–32)
Calcium: 9.5 mg/dL (ref 8.9–10.3)
Chloride: 101 mmol/L (ref 98–111)
Creatinine, Ser: 0.91 mg/dL (ref 0.61–1.24)
GFR calc Af Amer: >60 mL/min (ref >60)
GFR calc non Af Amer: >60 mL/min (ref >60)
Glucose, Bld: 142 mg/dL — ABNORMAL HIGH (ref 70–99)
Potassium: 4.8 mmol/L (ref 3.5–5.1)
Sodium: 135 mmol/L (ref 135–145)

## 2020-03-30 LAB — CBC
HCT: 42.8 % (ref 39.0–52.0)
Hemoglobin: 14.2 g/dL (ref 13.0–17.0)
MCH: 33.2 pg (ref 26.0–34.0)
MCHC: 33.2 g/dL (ref 30.0–36.0)
MCV: 100 fL (ref 80.0–100.0)
Platelets: 271 10*3/uL (ref 150–400)
RBC: 4.28 MIL/uL (ref 4.22–5.81)
RDW: 15.2 % (ref 11.5–15.5)
WBC: 8.7 10*3/uL (ref 4.0–10.5)
nRBC: 0 % (ref 0.0–0.2)

## 2020-03-30 LAB — LIPID PANEL
Cholesterol: 151 mg/dL (ref 0–200)
HDL: 48 mg/dL (ref >40)
LDL Cholesterol: 24 mg/dL (ref 0–99)
Total CHOL/HDL Ratio: 3.1 RATIO
Triglycerides: 394 mg/dL — ABNORMAL HIGH (ref <150)
VLDL: 79 mg/dL — ABNORMAL HIGH (ref 0–40)

## 2020-03-30 MED ORDER — CARVEDILOL 25 MG PO TABS: 25.0000 mg | ORAL_TABLET | Freq: Two times a day (BID) | ORAL | 3 refills | Status: DC

## 2020-03-30 NOTE — Patient Instructions (Signed)
INCREASE Carvedilol to 25mg  twice daily  Routine lab work today. Will notify you of abnormal results  Follow up in 3 months

## 2020-03-31 ENCOUNTER — Other Ambulatory Visit (HOSPITAL_COMMUNITY): Payer: Self-pay | Admitting: Surgery

## 2020-03-31 MED ORDER — ICOSAPENT ETHYL 1 G PO CAPS: 2.0000 g | ORAL_CAPSULE | Freq: Two times a day (BID) | ORAL | 3 refills | Status: DC

## 2020-03-31 NOTE — Progress Notes (Signed)
Vascepa 2gm BID prescription sent per Dr. Shirlee Latch.

## 2020-03-31 NOTE — Telephone Encounter (Signed)
Cardiac Rehab Medication Review by a Pharmacist  Does the patient  feel that his/her medications are working for him/her? YES  Has the patient been experiencing any side effects to the medications prescribed? NO  Does the patient measure his/her own blood pressure or blood glucose at home?  NO  Does the patient have any problems obtaining medications due to transportation or finances? NO  Understanding of regimen: good Understanding of indications: good Potential of compliance: good   Yvetta Coder, PharmD PGY1 Acute Care Pharmacy Resident 03/31/20 11:15

## 2020-04-01 NOTE — Progress Notes (Signed)
PCP: Dr. Nolen [name] Cardiology: Dr. Allyson [name] HF Cardiology: Dr. Shirlee [name]  59 y.o. with history of nonischemic cardiomyopathy and OSA was referred by Dr. Allyson [name] for evaluation of CHF.  He was initially diagnosed in early 2/18.  He had URI symptoms then developed dyspnea and put on weight. Echo was done in 2/18, showing EF 20-25%.  LHC in 2/18 showed normal coronaries. He was started on treatment for cardiomyopathy, and EF rose to 40% by 6/18. However, echo in 6/19 showed EF back down to 15-20% with severe RV dilation/dysfunction.  CPX was done in 7/19, showed moderate functional limitation though submaximal.  He had St Jude ICD placed in 8/19.   Atrial fibrillation noted in 12/20, he had TEE-guided DCCV in 12/20.  He remains in NSR today and has not felt palpitations.  EF on TEE was 25-30%.   He has been a heavy drinker but has cut back.  Still drinking on occasion, however.   He had left shoulder replacement in 2/21.  He developed a surgical site infection that is now resolved.    Patient returns for followup of CHF.  Overall doing well.  Has a dry cough occasionally that improves with TUMS, thinks due to GERD.  Shoulder better.  Had COVID vaccine.  Weight up about 5 lbs.  Has been at the beach, eating out and drinking more.  Gets fatigued after walking about a mile, generally no dyspnea.     ECG (personally reviewed): NSR with PACs  Labs (7/19): K 4.9, creatinine 0.91 Labs (8/19): Na 128, K 4.9, creatinine 0.72 Labs (9/19): LDL 45 Labs (12/19): K 3.9, creatinine 0.94  Labs (1/20): K 4.5, creatinine 0.94, hgb 13.2 Labs (10/20): K 4.4, creatinine 0.99 Labs (12/20): K 4.6, creatinine 1.0 Labs (2/21): K 3.9, creatinine 0.86 Labs (4/21): K 4.9, creatinine 1.05 Labs (6/21): K 4, creatinine 0.94  PMH: 1. OSA: uses CPAP.  2. Depression 3. Chronic systolic CHF: Nonischemic cardiomyopathy.  Diagnosed around 2/18.  - LHC (2/18): Normal coronaries.  - Echo (2/18): EF 20-25% - Echo (6/18): EF 40% -  Echo (12/18): EF 35-40% - Echo (6/19): LV mildly dilated, EF 15-20%, RV severely dilated with severely decreased systolic function.  - CPX (7/19): peak VO2 16, VE/VCO2 slope 37, RER 1.03 => submaximal but probably moderate functional limitation.  - Cardiac MRI (7/19): EF 29% with mild LV dilation, mildly decreased RV systolic function EF 40%, mid-wall LGE throughtout the septal wall and the inferor wall.  - St Jude ICD placed in 8/19.  - Echo (12/19): EF 25-30%, diffuse hypokinesis, normal RV size and systolic function.  - Echo (12/20): EF 25-30%, moderate LVH, normal RV size and systolic function.  - TEE (12/20): EF 25-30%, mildly decreased RV systolic function.  4. ETOH abuse.  5. Atrial fibrillation: First noted in 12/20.  - DCCV to NSR in 12/20.  6. 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 No drugs. Lives in [place].   FH: Sister with congenital heart abnormality.  No other cardiac disease.   ROS: All systems reviewed and negative except as per HPI.  Current Outpatient Medications  Medication Sig Dispense Refill  . apixaban (ELIQUIS) 5 MG TABS tablet Take 1 tablet (5 mg total) by mouth 2 (two) times daily. 60 tablet 6  . carvedilol (COREG) 25 MG tablet Take 1 tablet (25 mg total) by mouth 2 (two) times daily with a meal. 60 tablet 3  . dapagliflozin  propanediol (FARXIGA) 10 MG TABS tablet Take 10 mg by mouth daily.    . furosemide (LASIX) 40 MG tablet Take 1 tablet (40 mg total) by mouth every morning AND 0.5 tablets (20 mg total) every evening. 135 tablet 3  . LORazepam (ATIVAN) 1 MG tablet Take 0.5-1 mg by mouth every 8 (eight) hours as needed for anxiety.    . Multiple Vitamins-Minerals (MULTIVITAMIN WITH MINERALS) tablet Take 1 tablet by mouth daily.    . rosuvastatin (CRESTOR) 20 MG tablet TAKE 1 TABLET BY MOUTH EVERY DAY 90 tablet 3  . sacubitril-valsartan (ENTRESTO) 97-103 MG Take 1 tablet by  mouth 2 (two) times daily. 60 tablet 5  . sertraline (ZOLOFT) 50 MG tablet TAKE 1 TABLET BY MOUTH EVERY DAY 30 tablet 0  . sildenafil (VIAGRA) 100 MG tablet Take 0.5 tablets (50 mg total) by mouth at bedtime as needed for erectile dysfunction. 10 tablet 3  . spironolactone (ALDACTONE) 25 MG tablet Take 25 mg by mouth daily.    Marland Kitchen icosapent Ethyl (VASCEPA) 1 g capsule Take 2 capsules (2 g total) by mouth 2 (two) times daily. 120 capsule 3   No current facility-administered medications for this encounter.   BP 112/76   Pulse 86   Wt 117 kg (258 lb)   SpO2 97%   BMI 35.98 kg/m  General: NAD Neck: No JVD, no thyromegaly or thyroid nodule.  Lungs: Clear to auscultation bilaterally with normal respiratory effort. CV: Nondisplaced PMI.  Heart regular S1/S2, no S3/S4, no murmur.  No peripheral edema.  No carotid bruit.  Normal pedal pulses.  Abdomen: Soft, nontender, no hepatosplenomegaly, no distention.  Skin: Intact without lesions or rashes.  Neurologic: Alert and oriented x 3.  Psych: Normal affect. Extremities: No clubbing or cyanosis.  HEENT: Normal.   Assessment/Plan: 1. Chronic systolic CHF: Nonischemic cardiomyopathy with biventricular failure.  Cath in 2/18 with no coronary disease.  Echo (6/19) with EF 15-20%, severe RV dilation/dysfunction.  CPX 7/19 submaximal but probably moderate functional limitation.  Cardiac MRI in 7/19 showed EF 29%.  LGE pattern was concerning for prior viral myocarditis. Echo in 12/20 showed stable EF 25-30%.  Now with St Jude ICD.  Cause of CMP suspected to be viral myocarditis versus ETOH.  No family history of cardiomyopathy.  NYHA class II symptoms. Not volume overloaded on exam.  - Continue dapagliflozin 10 mg daily.  - Continue spironolactone 25 mg daily.   - Increase Coreg to 25 mg bid. - Continue Entresto 97/103 bid.  - Continue Lasix 40 qam/20 qpm, BMET today  - Encouraged him to cut back further on ETOH.    - He is interested in cardiac rehab,  will refer.  Needs to get more active.   2. OSA: Continue CPAP.  3. Anxiety: He is on sertraline.  4. ETOH Abuse: This is likely contributing to his cardiomyopathy and likely played a role in triggering atrial fibrillation.  He has now quit.   5. Atrial fibrillation: Paroxysmal, remains in NSR after DCCV in 12/20.  - Continue Eliquis 5 mg bid.   - Stay off ETOH.   - If atrial fibrillation returns off ETOH, consider ablation.   Followup in 3 months.    John [name] 04/01/2020

## 2020-04-04 ENCOUNTER — Other Ambulatory Visit: Payer: Self-pay

## 2020-04-04 ENCOUNTER — Encounter (HOSPITAL_COMMUNITY)
Admission: RE | Admit: 2020-04-04 | Discharge: 2020-04-04 | Disposition: A | Discharge status: Home / Self Care | Payer: 59 | Source: Ambulatory Visit | Attending: Cardiology | Admitting: Cardiology

## 2020-04-04 NOTE — Progress Notes (Signed)
Cardiac Rehab Telephone Note:  Successful telephone encounter to John [name] to confirm Cardiac Rehab orientation appointment for 04/06/20 at 9:00. Nursing assessment completed. Patient questions answered. Instructions for appointment provided. Patient screening for Covid-19 negative.  Chastelyn Athens E. Suzie Portela RN, BSN [place]. [facility]  Cardiac and Pulmonary Rehabilitation Phone: [phone] Fax: [phone]

## 2020-04-06 ENCOUNTER — Telehealth (HOSPITAL_COMMUNITY): Payer: Self-pay | Admitting: Internal Medicine

## 2020-04-06 ENCOUNTER — Inpatient Hospital Stay (HOSPITAL_COMMUNITY): Admission: RE | Admit: 2020-04-06 | Payer: 59 | Source: Ambulatory Visit

## 2020-04-07 ENCOUNTER — Telehealth (HOSPITAL_COMMUNITY): Payer: Self-pay | Admitting: Pharmacist

## 2020-04-07 NOTE — Telephone Encounter (Signed)
Patient Advocate Encounter   Received notification from CoverMyMeds that prior authorization for Vascepa is required.   PA submitted on CoverMyMeds Key [id number] Status is pending   Will continue to follow.  Karle Plumber, PharmD, BCPS, BCCP, CPP [facility] Pharmacist [phone]

## 2020-04-10 ENCOUNTER — Ambulatory Visit (HOSPITAL_COMMUNITY): Payer: 59

## 2020-04-10 ENCOUNTER — Telehealth (HOSPITAL_COMMUNITY): Payer: Self-pay

## 2020-04-10 NOTE — Telephone Encounter (Signed)
Recv'ed phone call from pt, pt stated he was calling to get rescheduled for CR. Adv pt the person who would need to reschedule him is out of the office today (working at a diff location, [facility]). Pt was irate, upset and asking why he couldn't be reschedule. Placed pt on hold and called Carlette at the [facility] location and adv her of the conversation. She stated since 1st referral pt has been scheduled up to 7 times and have missed a few orientations for diff reasons. Came back on the phone to adv pt of what Carlette stated and that she will give him a call when she returns to [facility] CR. Pt then stated he is recording the phone call and that he already has 2 lawyers. I then adv pt we would not be able to continue conversation. Pt got loud and stated no, I placed pt on hold and attempted to contact my AD but she was gone for the day. I phoned Carlette again to adv her of what pt stated, while talking to Carlette pt diconnected the phone call.  Pt also stated that he had a conversation with Dr. Shirlee Latch and Dr. Shirlee Latch stated to him, he should be priority on list to be sch.

## 2020-04-11 ENCOUNTER — Telehealth (HOSPITAL_COMMUNITY): Payer: Self-pay | Admitting: *Deleted

## 2020-04-11 NOTE — Telephone Encounter (Signed)
Returned call to pt to reschedule cardiac rehab.  Pt agreeable.  Scheduled for August 12th at 8:00. Exercise at 7:15 starting on 04/30/20.  Pt reminded that he must attend and to please contact us if he forsee that he is unable to keep this appt.  Advised that we have a tremendous backlog and waiting list for other patients who wish to participate in Cardiac rehab.  Verbalized understanding. Alanson Aly, BSN Cardiac and Emergency planning/management officer

## 2020-04-11 NOTE — Telephone Encounter (Signed)
Advanced Heart Failure Patient Advocate Encounter  Prior Authorization for [facility] has been approved.    Effective dates: 04/07/20 through 04/07/21  Karle Plumber, PharmD, BCPS, BCCP, CPP Heart Failure Clinic Pharmacist [phone]

## 2020-04-12 ENCOUNTER — Ambulatory Visit (HOSPITAL_COMMUNITY): Payer: 59

## 2020-04-14 ENCOUNTER — Ambulatory Visit (INDEPENDENT_AMBULATORY_CARE_PROVIDER_SITE_OTHER): Payer: 59 | Admitting: Family Medicine

## 2020-04-14 ENCOUNTER — Encounter: Payer: Self-pay | Admitting: Family Medicine

## 2020-04-14 ENCOUNTER — Other Ambulatory Visit: Payer: Self-pay

## 2020-04-14 ENCOUNTER — Ambulatory Visit (HOSPITAL_COMMUNITY): Payer: 59

## 2020-04-14 VITALS — BP 130/70 | HR 79 | Temp 98.1°F | Resp 18 | Ht 70.0 in | Wt 261.1 lb

## 2020-04-14 DIAGNOSIS — R739 Hyperglycemia, unspecified: Secondary | ICD-10-CM

## 2020-04-14 DIAGNOSIS — I5042 Chronic combined systolic (congestive) and diastolic (congestive) heart failure: Secondary | ICD-10-CM

## 2020-04-14 DIAGNOSIS — Z114 Encounter for screening for human immunodeficiency virus [HIV]: Secondary | ICD-10-CM | POA: Diagnosis not present

## 2020-04-14 DIAGNOSIS — Z1159 Encounter for screening for other viral diseases: Secondary | ICD-10-CM

## 2020-04-14 DIAGNOSIS — K2971 Gastritis, unspecified, with bleeding: Secondary | ICD-10-CM

## 2020-04-14 DIAGNOSIS — M1612 Unilateral primary osteoarthritis, left hip: Secondary | ICD-10-CM

## 2020-04-14 DIAGNOSIS — Z95810 Presence of automatic (implantable) cardiac defibrillator: Secondary | ICD-10-CM

## 2020-04-14 DIAGNOSIS — Z23 Encounter for immunization: Secondary | ICD-10-CM | POA: Diagnosis not present

## 2020-04-14 DIAGNOSIS — Z96611 Presence of right artificial shoulder joint: Secondary | ICD-10-CM

## 2020-04-14 DIAGNOSIS — F1021 Alcohol dependence, in remission: Secondary | ICD-10-CM

## 2020-04-14 DIAGNOSIS — S42202A Unspecified fracture of upper end of left humerus, initial encounter for closed fracture: Secondary | ICD-10-CM

## 2020-04-14 DIAGNOSIS — E7800 Pure hypercholesterolemia, unspecified: Secondary | ICD-10-CM

## 2020-04-14 DIAGNOSIS — I480 Paroxysmal atrial fibrillation: Secondary | ICD-10-CM

## 2020-04-14 DIAGNOSIS — G4730 Sleep apnea, unspecified: Secondary | ICD-10-CM | POA: Diagnosis not present

## 2020-04-14 DIAGNOSIS — Z96612 Presence of left artificial shoulder joint: Secondary | ICD-10-CM

## 2020-04-14 DIAGNOSIS — N529 Male erectile dysfunction, unspecified: Secondary | ICD-10-CM

## 2020-04-14 DIAGNOSIS — F5101 Primary insomnia: Secondary | ICD-10-CM

## 2020-04-14 MED ORDER — ZOLPIDEM TARTRATE 5 MG PO TABS: 5.0000 mg | ORAL_TABLET | Freq: Every evening | ORAL | 5 refills | Status: DC | PRN

## 2020-04-14 NOTE — Patient Instructions (Addendum)
Health Maintenance Due  Topic Date Due  . Hepatitis C Screening - next week Never done  . COVID-19 Vaccine (1)- send [place] dates through mychart Never done  . HIV Screening - next week Never done  . COLONOSCOPY - check on brothers history. If no colon cancer or polyps we can do cologuard testing instead Never done  . TETANUS/TDAP will get today  01/28/2017   Scheduled labs for next week- likely have to call back to schedule.   Stop lorazepam. Start ambien 5 mg for sleep- try to limit to 2-3 days a week if you can and have to use cpap. Also do not drink alcohol within 6 hours of taking and in general for your heart alcohol avoidance is advised  Please call [phone] to schedule a visit with [facility] -Colen Darling is an excellent counselor who is based out of our clinic - if they are not offering in person yet ok to look into insurance about who else they may covering for counseling/therapy   Recommended follow up: Return in about 4 months (around 08/15/2020) for physical or sooner if needed.

## 2020-04-14 NOTE — Progress Notes (Signed)
Phone: [phone]   Subjective:  Patient presents today to establish care.  Prior patient of Dr. Nolen Mu.  Chief Complaint  Patient presents with  . New Patient (Initial Visit)   See problem oriented charting  Review of Systems  Constitutional: Negative for chills and fever.       Weight gain 50 lbs  HENT: Negative for hearing loss and tinnitus.   Eyes: Negative for blurred vision and double vision.  Respiratory: Negative for cough and shortness of breath.   Cardiovascular: Negative for chest pain and palpitations.  Gastrointestinal: Negative for heartburn and nausea.  Genitourinary: Positive for frequency. Negative for dysuria.  Musculoskeletal: Negative for myalgias and neck pain.  Skin: Negative for itching and rash.  Neurological: Negative for dizziness and headaches.  Endo/Heme/Allergies: Negative for polydipsia. Does not bruise/bleed easily.  Psychiatric/Behavioral: Positive for depression. The patient is nervous/anxious.        Difficulty with sexual arousal    The following were reviewed and entered/updated in epic: Past Medical History:  Diagnosis Date  . Anxiety    Ativan  . Chronic combined systolic and diastolic CHF, NYHA class 3 (HCC) 10/2016   Nonischemic cardiomyopathy. EF 20-25%.  . Chronic left hip pain   . Depression    history of  . Dysrhythmia    A-FIB  . GI bleed 03/2016  . Headache   . Hypertensive heart disease with combined systolic and diastolic congestive heart failure (HCC) 10/2016  . Nonischemic cardiomyopathy (HCC) 10/2016   Echo with EF 20-25%. Cardiac catheterization with no CAD. LVEDP was 41 mmHg, PCWP 36 mmHg  . Osteoarthritis    right shoulder  . Right hand fracture   . Seasonal allergies   . Sleep apnea    wears a CPAP  . Wears glasses    Patient Active Problem List   Diagnosis Date Noted  . Paroxysmal atrial fibrillation (HCC) 04/14/2020    Priority: High  . S/P ICD (internal cardiac defibrillator) procedure 04/14/2020     Priority: High  . Status post reverse total shoulder replacement, left 11/11/2019    Priority: High  . Non-ischemic cardiomyopathy (HCC)     Priority: High  . Chronic combined systolic and diastolic CHF, NYHA class 3 (HCC) 10/2016    Priority: High  . Hyperlipidemia 05/27/2018    Priority: Medium  . Essential hypertension 11/08/2016    Priority: Medium  . GI bleed 03/19/2016    Priority: Medium  . Alcohol dependence in remission (HCC) 03/19/2016    Priority: Medium  . ED (erectile dysfunction) 08/21/2015    Priority: Medium  . Hyperglycemia 08/21/2015    Priority: Medium  . Insomnia 08/21/2015    Priority: Medium  . Sleep apnea 08/21/2015    Priority: Medium  . Unspecified fracture of upper end of left humerus, initial encounter for closed fracture 09/03/2017    Priority: Low  . S/P shoulder replacement 11/24/2015    Priority: Low  . Allergic rhinitis 08/21/2015    Priority: Low  . Degenerative joint disease of left hip 03/27/2015    Priority: Low   Past Surgical History:  Procedure Laterality Date  . CARDIAC CATHETERIZATION    . CARDIOVERSION N/A 09/01/2019   Procedure: CARDIOVERSION;  Surgeon: Laurey Morale, MD;  Location: [facility] ENDOSCOPY;  Service: Cardiovascular;  Laterality: N/A;  . ESOPHAGOGASTRODUODENOSCOPY (EGD) WITH PROPOFOL N/A 03/20/2016   Procedure: ESOPHAGOGASTRODUODENOSCOPY (EGD) WITH PROPOFOL;  Surgeon: Charlott Rakes, MD;  Location: [facility] ENDOSCOPY;  Service: Endoscopy;  Laterality: N/A;  . ICD IMPLANT  N/A 05/06/2018   Procedure: ICD IMPLANT;  Surgeon: Thurmon Fair, MD;  Location: [facility] INVASIVE CV LAB;  Service: Cardiovascular;  Laterality: N/A;  . IRRIGATION AND DEBRIDEMENT SHOULDER Left 11/11/2019   Procedure: LEFT SHOULDER IRRIGATION AND DEBRIDEMENT WITH POLY EXCHANGE;  Surgeon: Jones Broom, MD;  Location: [facility] ORS;  Service: Orthopedics;  Laterality: Left;  . RIGHT/LEFT HEART CATH AND CORONARY ANGIOGRAPHY N/A 10/21/2016   Procedure: Right/Left Heart Cath  and Coronary Angiography;  Surgeon: Runell Gess, MD;  Location: [facility] INVASIVE CV LAB;  Service: Cardiovascular: Angiographically normal coronary arteries. PCWP 33-36 mmHg, LVEDP 41 mmHg. PA pressure 60/35, mean 46 mmHg.  Cardiac output/cardiac index-3.93 /1.76 (Fick), 3.49/1.57 (thermodilution)  . TEE WITHOUT CARDIOVERSION N/A 09/01/2019   Procedure: TRANSESOPHAGEAL ECHOCARDIOGRAM (TEE);  Surgeon: Laurey Morale, MD;  Location: [facility] ENDOSCOPY;  Service: Cardiovascular;  Laterality: N/A;  . TOTAL HIP ARTHROPLASTY Left 03/27/2015   Procedure: LEFT TOTAL HIP ARTHROPLASTY ANTERIOR APPROACH;  Surgeon: Samson Frederic, MD;  Location: [facility] OR;  Service: Orthopedics;  Laterality: Left;  . TOTAL SHOULDER ARTHROPLASTY Right 11/24/2015   Procedure: RIGHT TOTAL SHOULDER ARTHROPLASTY;  Surgeon: Beverely Low, MD;  Location: [facility] OR;  Service: Orthopedics;  Laterality: Right;  . TOTAL SHOULDER ARTHROPLASTY Left 10/19/2019   Procedure: REVERSE TOTAL SHOULDER ARTHROPLASTY;  Surgeon: Jones Broom, MD;  Location: [facility] ORS;  Service: Orthopedics;  Laterality: Left;  . TRANSTHORACIC ECHOCARDIOGRAM  10/2016   EF 20-25%. Diffuse hypokinesis but akinesis of the entire inferoseptal wall and apical wall. Moderate biatrial enlargement. PA pressure estimated 64 mmHg.  . WISDOM TOOTH EXTRACTION      Family History  Problem Relation Age of Onset  . Dementia Mother        died at 72  . Lung cancer Father        smoker  . Congenital heart disease Sister        lived to 64   . Healthy Brother   . Healthy Sister   . Prostate cancer Brother        possible cancer  . Healthy Brother   . Healthy Brother     Medications- reviewed and updated Current Outpatient Medications  Medication Sig Dispense Refill  . apixaban (ELIQUIS) 5 MG TABS tablet Take 1 tablet (5 mg total) by mouth 2 (two) times daily. 60 tablet 6  . carvedilol (COREG) 25 MG tablet Take 1 tablet (25 mg total) by mouth 2 (two) times daily with a meal. 60 tablet 3   . dapagliflozin propanediol (FARXIGA) 10 MG TABS tablet Take 10 mg by mouth daily.    . furosemide (LASIX) 40 MG tablet Take 1 tablet (40 mg total) by mouth every morning AND 0.5 tablets (20 mg total) every evening. 135 tablet 3  . icosapent Ethyl (VASCEPA) 1 g capsule Take 2 capsules (2 g total) by mouth 2 (two) times daily. 120 capsule 3  . LORazepam (ATIVAN) 1 MG tablet Take 1 mg by mouth in the morning, at noon, and at bedtime.     . Multiple Vitamins-Minerals (MULTIVITAMIN WITH MINERALS) tablet Take 1 tablet by mouth daily.    . rosuvastatin (CRESTOR) 20 MG tablet TAKE 1 TABLET BY MOUTH EVERY DAY 90 tablet 3  . sacubitril-valsartan (ENTRESTO) 97-103 MG Take 1 tablet by mouth 2 (two) times daily. 60 tablet 5  . sertraline (ZOLOFT) 50 MG tablet TAKE 1 TABLET BY MOUTH EVERY DAY 30 tablet 0  . sildenafil (VIAGRA) 100 MG tablet Take 0.5 tablets (50 mg total) by mouth at  bedtime as needed for erectile dysfunction. 10 tablet 3  . spironolactone (ALDACTONE) 25 MG tablet Take 25 mg by mouth daily.     No current facility-administered medications for this visit.    Allergies-reviewed and updated No Known Allergies  Social History   Social History Narrative   GF that lives with him. 1 dog. Close with 3 sons that work for wife- christopher 15 at [employer], Blanding 21, Ali Chukson 25 in 2021.       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    G[place] up in [place].     Objective  Objective:  BP (!) 130/70   Pulse 79   Temp 98.1 F (36.7 C) (Temporal)   Resp 18   Ht 5\' 10"  (1.778 m)   Wt (!) 261 lb 1.6 oz (118.4 kg)   SpO2 98%   BMI 37.46 kg/m  Gen: NAD, resting comfortably HEENT: Mucous membranes are moist. Oropharynx normal. TM normal. Eyes: sclera and lids normal, PERRLA Neck: no thyromegaly, no cervical lymphadenopathy CV: RRR no murmurs rubs or gallops Lungs: CTAB no crackles, wheeze, rhonchi Abdomen:  soft/nontender/nondistended/normal bowel sounds. No rebound or guarding.  Ext: no edema Skin: warm, dry Neuro: 5/5 strength in upper and lower extremities, normal gait, normal reflexes Walks with slight limp   Assessment and Plan:   #Immunizations discussed-Tdap given today.  Patient has had COVID-19 vaccinations and he will let us know the dates Immunization History  Administered Date(s) Administered  . Influenza, Quadrivalent, Recombinant, Inj, Pf 08/05/2017  . Influenza,inj,Quad PF,6+ Mos 10/19/2016  . Influenza-Unspecified 06/14/2015, 10/19/2016  . Pneumococcal Polysaccharide-23 10/19/2016  . Td 01/29/2007  . Tdap 04/14/2020   # Hyperglycemia/insulin resistance/prediabetes S:  Medication: None Exercise and diet-significant weight gain since septic shoulder issues as well as heart failure.  Patient's girlfriend is very concerned he may have developed diabetes Lab Results  Component Value Date   HGBA1C 5.5 09/14/2019   A/P: Prior blood sugars have been high but A1c was not elevated-he agrees to come back by for A1c next week-our phlebotomist was out of the building by the end of our visit so we were not able to complete today  # Anxiety/Depression/insomnia/alcohol dependence in remission S: Medication: zoloft 50mg .  Some libido issues with this.  He does suffer from anxiety primarily-though at times feels down from his recent medical issues-just a few years ago he was essentially on no medications.  For insomnia-using lorazepam for bad but does not find this helpful.  In the past have been using in the daytime for anxiety as well.  We discussed with prior increased alcohol use benzodiazepines are not ideal.  He has used Ambien in the past and felt this was more effective than lorazepam for sleep-he has reduced his alcohol to 12 beverages per week and agrees if we try this to not drink within 6 hours of the Ambien.  Melatonin also been tried and was not helpful A/P: Some  situationally depressed mood after medical illnesses-I think getting back into cardiac rehab will be helpful.  With all the challenges he is faced this year also recommended getting plugged in with counseling-he agrees to consider  In regards to alcohol use-congratulated him on being down 12-week but encouraged complete cessation due to heart issues  For insomnia we will stop lorazepam and start Ambien.  He is compliant with CPAP so I think it is reasonable to use Ambien.  Recommended only using 2-3 times a week  and once again spacing at least 6 hours from alcohol-and ideally no alcohol at all  # Atrial fibrillation S: Rate controlled with carvedilol 25mg  BID Anticoagulated with eliquis Patient is  followed by cardiology: Dr. with CHF clinic A/P: Appropriately anticoagulated and rate controlled-continue current medication "does not obviously appear to be in A. fib at this time  #Heart failure with reduced ejection fraction-starting in 2018  EF as low as 15% S: Medication:coreg, farxiga 10mg , lasix 40mg  in AM and 20mg  in PM, entresto BID, spironactone 25mg    Edema: Denies  Weight gain: About 9 pounds in the last 2 months but is compliant with diuretics A/P: Weight creeping up slightly-continue Lasix 40 mg in the morning and 20 mg in the evening with spironolactone 25 mg as well-continue close follow-up with heart failure clinic -I love his plans for cardiac rehab-I think that is a great extent  #hyperlipidemia S: Medication:  Rosuvastatin 20mg , vascepa Lab Results  Component Value Date   CHOL 151 03/30/2020   HDL 48 03/30/2020   LDLCALC 24 03/30/2020   TRIG 394 (H) 03/30/2020   CHOLHDL 3.1 03/30/2020   A/P: Excellent control last check-continue current medication   Recommended follow up: Return in about 4 months (around 08/15/2020) for physical or sooner if needed. Future Appointments  Date Time Provider Department Center  04/21/2020 10:00 AM [facility] LAB [facility]   04/27/2020  8:00 AM [facility] PHASE II ORIENT [facility] None  05/01/2020  7:15 AM [facility] PHASE II EXC [facility] None  05/03/2020  7:15 AM [facility] PHASE II EXC [facility] None  05/04/2020  7:05 AM [facility] DEVICE REMOTES [facility]  05/05/2020  7:15 AM [facility] PHASE II EXC [facility] None  05/08/2020  7:15 AM [facility] PHASE II EXC [facility] None  05/10/2020  7:15 AM [facility] PHASE II EXC [facility] None  05/12/2020  7:15 AM [facility] PHASE II EXC [facility] None  05/15/2020  7:15 AM [facility] PHASE II EXC [facility] None  05/17/2020  7:15 AM [facility] PHASE II EXC [facility] None  05/19/2020  7:15 AM [facility] PHASE II EXC [facility] None  05/24/2020  7:15 AM [facility] PHASE II EXC [facility] None  05/26/2020  7:15 AM [facility] PHASE II EXC [facility] None  05/29/2020  7:15 AM [facility] PHASE II EXC [facility] None  05/31/2020  7:15 AM [facility] PHASE II EXC [facility] None  06/02/2020  7:15 AM [facility] PHASE II EXC [facility] None  06/05/2020  7:15 AM [facility] PHASE II EXC [facility] None  06/07/2020  7:15 AM [facility] PHASE II EXC [facility] None  06/09/2020  7:15 AM [facility] PHASE II EXC [facility] None  06/12/2020  7:15 AM [facility] PHASE II EXC [facility] None  06/14/2020  7:15 AM [facility] PHASE II EXC [facility] None  06/16/2020  7:15 AM [facility] PHASE II EXC [facility] None  06/19/2020  7:15 AM [facility] PHASE II EXC [facility] None  06/19/2020 11:00 AM 06/11/2020, MD [facility] None  06/21/2020  7:15 AM [facility] PHASE II EXC [facility] None  06/23/2020  7:15 AM [facility] PHASE II EXC [facility] None  08/03/2020  7:05 AM [facility] DEVICE REMOTES [facility]  08/22/2020  4:00 PM Kathey Simer, 08/19/2020, MD [facility]    Meds ordered this encounter  Medications  . zolpidem (AMBIEN) 5 MG tablet    Sig: Take 1 tablet (5 mg total) by mouth at bedtime as needed for sleep.    Dispense:  30 tablet    Refill:  5   Time Spent: 60 minutes of total time (4:41 PM-  5:41 PM) was spent on the date of the encounter performing the  following actions: chart review prior to seeing the patient, obtaining history, performing a medically necessary exam, counseling on the treatment plan, placing orders, and documenting in our EHR.   Return precautions advised. [name], MD

## 2020-04-15 NOTE — Assessment & Plan Note (Signed)
#  hyperlipidemia S: Medication:  Rosuvastatin 20mg , vascepa Lab Results  Component Value Date   CHOL 151 03/30/2020   HDL 48 03/30/2020   LDLCALC 24 03/30/2020   TRIG 394 (H) 03/30/2020   CHOLHDL 3.1 03/30/2020   A/P: Excellent control last check-continue current medication

## 2020-04-15 NOTE — Assessment & Plan Note (Signed)
#   Hyperglycemia/insulin resistance/prediabetes S:  Medication: None Exercise and diet-significant weight gain since septic shoulder issues as well as heart failure.  Patient's girlfriend is very concerned he may have developed diabetes Lab Results  Component Value Date   HGBA1C 5.5 09/14/2019   A/P: Prior blood sugars have been high but A1c was not elevated-he agrees to come back by for A1c next week-our phlebotomist was out of the building by the end of our visit so we were not able to complete today

## 2020-04-15 NOTE — Assessment & Plan Note (Signed)
#   Anxiety/Depression/insomnia/alcohol dependence in remission S: Medication: zoloft 50mg .  Some libido issues with this.  He does suffer from anxiety primarily-though at times feels down from his recent medical issues-just a few years ago he was essentially on no medications.  For insomnia-using lorazepam for bad but does not find this helpful.  In the past have been using in the daytime for anxiety as well.  We discussed with prior increased alcohol use benzodiazepines are not ideal.  He has used Ambien in the past and felt this was more effective than lorazepam for sleep-he has reduced his alcohol to 12 beverages per week and agrees if we try this to not drink within 6 hours of the Ambien.  Melatonin also been tried and was not helpful A/P: Some situationally depressed mood after medical illnesses-I think getting back into cardiac rehab will be helpful.  With all the challenges he is faced this year also recommended getting plugged in with counseling-he agrees to consider  In regards to alcohol use-congratulated him on being down 12-week but encouraged complete cessation due to heart issues  For insomnia we will stop lorazepam and start Ambien.  He is compliant with CPAP so I think it is reasonable to use Ambien.  Recommended only using 2-3 times a week and once again spacing at least 6 hours from alcohol-and ideally no alcohol at all

## 2020-04-15 NOTE — Assessment & Plan Note (Signed)
#  Heart failure with reduced ejection fraction-starting in 2018  EF as low as 15% S: Medication:coreg, farxiga 10mg , lasix 40mg  in AM and 20mg  in PM, entresto BID, spironactone 25mg    Edema: Denies  Weight gain: About 9 pounds in the last 2 months but is compliant with diuretics A/P: Weight creeping up slightly-continue Lasix 40 mg in the morning and 20 mg in the evening with spironolactone 25 mg as well-continue close follow-up with heart failure clinic

## 2020-04-15 NOTE — Assessment & Plan Note (Signed)
#   Anxiety/Depression/insomnia/alcohol dependence in remission S: Medication: zoloft 50mg.  Some libido issues with this.  He does suffer from anxiety primarily-though at times feels down from his recent medical issues-just a few years ago he was essentially on no medications.  For insomnia-using lorazepam for bad but does not find this helpful.  In the past have been using in the daytime for anxiety as well.  We discussed with prior increased alcohol use benzodiazepines are not ideal.  He has used Ambien in the past and felt this was more effective than lorazepam for sleep-he has reduced his alcohol to 12 beverages per week and agrees if we try this to not drink within 6 hours of the Ambien.  Melatonin also been tried and was not helpful A/P: Some situationally depressed mood after medical illnesses-I think getting back into cardiac rehab will be helpful.  With all the challenges he is faced this year also recommended getting plugged in with counseling-he agrees to consider  In regards to alcohol use-congratulated him on being down 12-week but encouraged complete cessation due to heart issues  For insomnia we will stop lorazepam and start Ambien.  He is compliant with CPAP so I think it is reasonable to use Ambien.  Recommended only using 2-3 times a week and once again spacing at least 6 hours from alcohol-and ideally no alcohol at all 

## 2020-04-17 ENCOUNTER — Ambulatory Visit (HOSPITAL_COMMUNITY): Payer: 59

## 2020-04-18 ENCOUNTER — Other Ambulatory Visit (HOSPITAL_COMMUNITY): Payer: Self-pay | Admitting: Cardiology

## 2020-04-19 ENCOUNTER — Ambulatory Visit (HOSPITAL_COMMUNITY): Payer: 59

## 2020-04-19 ENCOUNTER — Other Ambulatory Visit (HOSPITAL_COMMUNITY): Payer: Self-pay | Admitting: Cardiology

## 2020-04-21 ENCOUNTER — Other Ambulatory Visit: Payer: Self-pay

## 2020-04-21 ENCOUNTER — Other Ambulatory Visit: Payer: 59

## 2020-04-21 ENCOUNTER — Ambulatory Visit (HOSPITAL_COMMUNITY): Payer: 59

## 2020-04-21 DIAGNOSIS — Z114 Encounter for screening for human immunodeficiency virus [HIV]: Secondary | ICD-10-CM

## 2020-04-21 DIAGNOSIS — Z1159 Encounter for screening for other viral diseases: Secondary | ICD-10-CM

## 2020-04-21 DIAGNOSIS — R739 Hyperglycemia, unspecified: Secondary | ICD-10-CM

## 2020-04-21 NOTE — Addendum Note (Signed)
Addended by: Mathews Argyle on: 04/21/2020 10:09 AM   Modules accepted: Orders

## 2020-04-24 ENCOUNTER — Ambulatory Visit (HOSPITAL_COMMUNITY): Payer: 59

## 2020-04-24 LAB — HIV ANTIBODY (ROUTINE TESTING W REFLEX): HIV 1&2 Ab, 4th Generation: NONREACTIVE

## 2020-04-24 LAB — HEMOGLOBIN A1C
Hgb A1c MFr Bld: 5.5 % of total Hgb (ref <5.7)
Mean Plasma Glucose: 111 (calc)
eAG (mmol/L): 6.2 (calc)

## 2020-04-24 LAB — HEPATITIS C ANTIBODY
Hepatitis C Ab: NONREACTIVE
SIGNAL TO CUT-OFF: 0.01 (ref <1.00)

## 2020-04-26 ENCOUNTER — Ambulatory Visit (HOSPITAL_COMMUNITY): Payer: 59

## 2020-04-27 ENCOUNTER — Ambulatory Visit (HOSPITAL_COMMUNITY): Payer: 59

## 2020-04-27 ENCOUNTER — Encounter (HOSPITAL_COMMUNITY): Payer: Self-pay | Admitting: *Deleted

## 2020-04-27 ENCOUNTER — Other Ambulatory Visit: Payer: Self-pay

## 2020-04-27 ENCOUNTER — Encounter (HOSPITAL_COMMUNITY)
Admission: RE | Admit: 2020-04-27 | Discharge: 2020-04-27 | Disposition: A | Discharge status: Home / Self Care | Payer: 59 | Source: Ambulatory Visit | Attending: Cardiology | Admitting: Cardiology

## 2020-04-27 VITALS — Ht 70.0 in | Wt 270.1 lb

## 2020-04-27 DIAGNOSIS — I5022 Chronic systolic (congestive) heart failure: Secondary | ICD-10-CM

## 2020-04-27 NOTE — Progress Notes (Signed)
Cardiac Individual Treatment Plan  Patient Details  Name: Oslo Huntsman MRN: [id number] Date of Birth: 26-Jun-1961 Referring Provider:     CARDIAC REHAB PHASE II ORIENTATION from 04/27/2020 in [facility] CARDIAC REHAB  Referring Provider Marca Ancona, MD      Initial Encounter Date:    CARDIAC REHAB PHASE II ORIENTATION from 04/27/2020 in [facility] CARDIAC REHAB  Date 04/27/20      Visit Diagnosis: Heart failure, chronic systolic (HCC)  Patient's Home Medications on Admission:  Current Outpatient Medications:    carvedilol (COREG) 25 MG tablet, Take 1 tablet (25 mg total) by mouth 2 (two) times daily with a meal., Disp: 60 tablet, Rfl: 3   dapagliflozin propanediol (FARXIGA) 10 MG TABS tablet, Take 10 mg by mouth daily., Disp: , Rfl:    ELIQUIS 5 MG TABS tablet, TAKE 1 TABLET BY MOUTH TWICE A DAY, Disp: 180 tablet, Rfl: 3   furosemide (LASIX) 40 MG tablet, Take 1 tablet (40 mg total) by mouth every morning AND 0.5 tablets (20 mg total) every evening., Disp: 135 tablet, Rfl: 3   icosapent Ethyl (VASCEPA) 1 g capsule, Take 2 capsules (2 g total) by mouth 2 (two) times daily., Disp: 120 capsule, Rfl: 3   Multiple Vitamins-Minerals (MULTIVITAMIN WITH MINERALS) tablet, Take 1 tablet by mouth daily., Disp: , Rfl:    rosuvastatin (CRESTOR) 20 MG tablet, TAKE 1 TABLET BY MOUTH EVERY DAY, Disp: 90 tablet, Rfl: 3   sacubitril-valsartan (ENTRESTO) 97-103 MG, Take 1 tablet by mouth 2 (two) times daily., Disp: 60 tablet, Rfl: 5   sertraline (ZOLOFT) 50 MG tablet, TAKE 1 TABLET BY MOUTH EVERY DAY, Disp: 30 tablet, Rfl: 0   sildenafil (VIAGRA) 100 MG tablet, TAKE 0.5 TABLETS (50 MG TOTAL) BY MOUTH AT BEDTIME AS NEEDED FOR ERECTILE DYSFUNCTION., Disp: 10 tablet, Rfl: 3   spironolactone (ALDACTONE) 25 MG tablet, Take 25 mg by mouth daily., Disp: , Rfl:    zolpidem (AMBIEN) 5 MG tablet, Take 1 tablet (5 mg total) by mouth at bedtime as needed for  sleep., Disp: 30 tablet, Rfl: 5  Past Medical History: Past Medical History:  Diagnosis Date   Anxiety    Ativan   Chronic combined systolic and diastolic CHF, NYHA class 3 (HCC) 10/2016   Nonischemic cardiomyopathy. EF 20-25%.   Chronic left hip pain    Depression    history of   Dysrhythmia    A-FIB   GI bleed 03/2016   Headache    Hypertensive heart disease with combined systolic and diastolic congestive heart failure (HCC) 10/2016   Nonischemic cardiomyopathy (HCC) 10/2016   Echo with EF 20-25%. Cardiac catheterization with no CAD. LVEDP was 41 mmHg, PCWP 36 mmHg   Osteoarthritis    right shoulder   Right hand fracture    Seasonal allergies    Sleep apnea    wears a CPAP   Wears glasses     Tobacco Use: Social History   Tobacco Use  Smoking Status Never Smoker  Smokeless Tobacco Never Used    Labs: Recent Review Flowsheet Data    Labs for ITP Cardiac and Pulmonary Rehab Latest Ref Rng & Units 06/09/2018 09/01/2019 09/14/2019 03/30/2020 04/21/2020   Cholestrol 0 - 200 mg/dL 818 - - 563 -   LDLCALC 0 - 99 mg/dL 45 - - 24 -   HDL >14 mg/dL 53 - - 48 -   Trlycerides <150 mg/dL 66 - - 970(Y) -   Hemoglobin A1c <5.7 %  of total Hgb - - 5.5 - 5.5   PHART 7.35 - 7.45 - - - - -   PCO2ART 32 - 48 mmHg - - - - -   HCO3 20.0 - 28.0 mmol/L - - - - -   TCO2 22 - 32 mmol/L - 24 - - -   O2SAT % - - - - -      Capillary Blood Glucose: Lab Results  Component Value Date   GLUCAP 113 (H) 11/11/2019   GLUCAP 130 (H) 10/19/2019   GLUCAP 108 (H) 10/19/2019     Exercise Target Goals: Exercise Program Goal: Individual exercise prescription set using results from initial 6 min walk test and THRR while considering  patients activity barriers and safety.   Exercise Prescription Goal: Initial exercise prescription builds to 30-45 minutes a day of aerobic activity, 2-3 days per week.  Home exercise guidelines will be given to patient during program as part of exercise  prescription that the participant will acknowledge.  Activity Barriers & Risk Stratification:  Activity Barriers & Cardiac Risk Stratification - 04/27/20 1546      Activity Barriers & Cardiac Risk Stratification   Activity Barriers Other (comment);Left Hip Replacement;Joint Problems;Deconditioning    Cardiac Risk Stratification High           6 Minute Walk:  6 Minute Walk    Row Name 04/27/20 1543         6 Minute Walk   Phase Initial     Distance 1507 feet     Walk Time 6 minutes     # of Rest Breaks 0     MPH 2.9     METS 3.1     RPE 11     Perceived Dyspnea  1     VO2 Peak 10.8     Symptoms Yes (comment)     Comments Pt reported SOB +1, bilateral hip pain 6/10     Resting HR 71 bpm     Resting BP 122/78     Resting Oxygen Saturation  97 %     Exercise Oxygen Saturation  during 6 min walk 96 %     Max Ex. HR 115 bpm     Max Ex. BP 150/82     2 Minute Post BP 108/62            Oxygen Initial Assessment:   Oxygen Re-Evaluation:   Oxygen Discharge (Final Oxygen Re-Evaluation):   Initial Exercise Prescription:  Initial Exercise Prescription - 04/27/20 1500      Date of Initial Exercise RX and Referring Provider   Date 04/27/20    Referring Provider Marca Ancona, MD    Expected Discharge Date 06/23/20      NuStep   Level 2    SPM 75    Minutes 15    METs 2.5      Track   Laps 12    Minutes 15    METs 2.39      Prescription Details   Frequency (times per week) 3x    Duration Progress to 10 minutes continuous walking  at current work load and total walking time to 30-45 min      Intensity   THRR 40-80% of Max Heartrate 65-130    Ratings of Perceived Exertion 11-13    Perceived Dyspnea 0-4      Progression   Progression Continue progressive overload as per policy without signs/symptoms or physical distress.      Resistance Training  Training Prescription Yes    Weight 3lbs    Reps 10-15           Perform Capillary Blood Glucose  checks as needed.  Exercise Prescription Changes:   Exercise Comments:   Exercise Goals and Review:  Exercise Goals    Row Name 04/27/20 1456             Exercise Goals   Increase Physical Activity Yes       Intervention Provide advice, education, support and counseling about physical activity/exercise needs.;Develop an individualized exercise prescription for aerobic and resistive training based on initial evaluation findings, risk stratification, comorbidities and participant's personal goals.       Expected Outcomes Short Term: Attend rehab on a regular basis to increase amount of physical activity.;Long Term: Exercising regularly at least 3-5 days a week.;Long Term: Add in home exercise to make exercise part of routine and to increase amount of physical activity.       Increase Strength and Stamina Yes       Intervention Provide advice, education, support and counseling about physical activity/exercise needs.;Develop an individualized exercise prescription for aerobic and resistive training based on initial evaluation findings, risk stratification, comorbidities and participant's personal goals.       Expected Outcomes Short Term: Increase workloads from initial exercise prescription for resistance, speed, and METs.;Short Term: Perform resistance training exercises routinely during rehab and add in resistance training at home;Long Term: Improve cardiorespiratory fitness, muscular endurance and strength as measured by increased METs and functional capacity ( )       Able to understand and use rate of perceived exertion (RPE) scale Yes       Intervention Provide education and explanation on how to use RPE scale       Expected Outcomes Short Term: Able to use RPE daily in rehab to express subjective intensity level;Long Term:  Able to use RPE to guide intensity level when exercising independently       Knowledge and understanding of Target Heart Rate Range (THRR) Yes       Intervention  Provide education and explanation of THRR including how the numbers were predicted and where they are located for reference       Expected Outcomes Short Term: Able to state/look up THRR;Long Term: Able to use THRR to govern intensity when exercising independently;Short Term: Able to use daily as guideline for intensity in rehab       Able to check pulse independently Yes       Intervention Provide education and demonstration on how to check pulse in carotid and radial arteries.;Review the importance of being able to check your own pulse for safety during independent exercise       Expected Outcomes Short Term: Able to explain why pulse checking is important during independent exercise;Long Term: Able to check pulse independently and accurately       Understanding of Exercise Prescription Yes       Intervention Provide education, explanation, and written materials on patient's individual exercise prescription       Expected Outcomes Short Term: Able to explain program exercise prescription;Long Term: Able to explain home exercise prescription to exercise independently              Exercise Goals Re-Evaluation :   Discharge Exercise Prescription (Final Exercise Prescription Changes):   Nutrition:  Target Goals: Understanding of nutrition guidelines, daily intake of sodium 1500mg , cholesterol 200mg , calories 30% from fat and 7% or less from saturated fats,  daily to have 5 or more servings of fruits and vegetables.  Biometrics:  Pre Biometrics - 04/27/20 1545      Pre Biometrics   Height 5\' 10"  (1.778 m)    Weight 122.5 kg    Waist Circumference 54 inches    Hip Circumference 51 inches    Waist to Hip Ratio 1.06 %    BMI (Calculated) 38.75    Triceps Skinfold 35 mm    % Body Fat 41.8 %    Grip Strength 34 kg    Flexibility 0 in    Single Leg Stand 0 seconds            Nutrition Therapy Plan and Nutrition Goals:   Nutrition Assessments:   Nutrition Goals  Re-Evaluation:   Nutrition Goals Re-Evaluation:   Nutrition Goals Discharge (Final Nutrition Goals Re-Evaluation):   Psychosocial: Target Goals: Acknowledge presence or absence of significant depression and/or stress, maximize coping skills, provide positive support system. Participant is able to verbalize types and ability to use techniques and skills needed for reducing stress and depression.  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  Feels depressed by his present health concearns      Family Dynamics   Good Support System? Yes    Comments Pt has children and girlfriend who are supportive of his efforts to increase his activity and make nutritional changes in his diet.      Barriers   Psychosocial barriers to participate in program The patient should benefit from training in stress management and relaxation.      Screening Interventions   Interventions Encouraged to exercise           Quality of Life Scores:  Quality of Life - 04/27/20 1600      Quality of Life   Select Quality of Life      Quality of Life Scores   Health/Function Pre 16.77 %    Socioeconomic Pre 24.25 %    Psych/Spiritual Pre 21.43 %    Family Pre 25.2 %    GLOBAL Pre 20.61 %          Scores of 19 and below usually indicate a poorer quality of life in these areas.  A difference of  2-3 points is a clinically meaningful difference.  A difference of 2-3 points in the total score of the Quality of Life Index has been associated with significant improvement in overall quality of life, self-image, physical symptoms, and general health in studies assessing change in quality of  life.  PHQ-9: Recent Review Flowsheet Data    Depression screen [facility] 2/9 04/27/2020 01/20/2020 12/06/2019   Decreased Interest 0 0 0   Down, Depressed, Hopeless 0 0 0   PHQ - 2 Score 0 0 0   Altered sleeping 0 - -   Tired, decreased energy 1 - -   Change in appetite 0 - -   Feeling bad or failure about yourself  1 - -   Trouble concentrating 0 - -   Moving slowly or fidgety/restless 0 - -   Suicidal thoughts 0 - -   PHQ-9 Score 2 - -   Difficult doing work/chores Not difficult at  all - -     Interpretation of Total Score  Total Score Depression Severity:  1-4 = Minimal depression, 5-9 = Mild depression, 10-14 = Moderate depression, 15-19 = Moderately severe depression, 20-27 = Severe depression   Psychosocial Evaluation and Intervention:   Psychosocial Re-Evaluation:   Psychosocial Discharge (Final Psychosocial Re-Evaluation):   Vocational Rehabilitation: Provide vocational rehab assistance to qualifying candidates.   Vocational Rehab Evaluation & Intervention:  Vocational Rehab - 04/27/20 1724      Initial Vocational Rehab Evaluation & Intervention   Assessment shows need for Vocational Rehabilitation No           Education: Education Goals: Education classes will be provided on a weekly basis, covering required topics. Participant will state understanding/return demonstration of topics presented.  Learning Barriers/Preferences:  Learning Barriers/Preferences - 04/27/20 1603      Learning Barriers/Preferences   Learning Barriers Sight    Learning Preferences Skilled Demonstration;Verbal Instruction;Video           Education Topics: Count Your Pulse:  -Group instruction provided by verbal instruction, demonstration, patient participation and written materials to support subject.  Instructors address importance of being able to find your pulse and how to count your pulse when at home without a heart monitor.  Patients get hands on experience counting their pulse  with staff help and individually.   Heart Attack, Angina, and Risk Factor Modification:  -Group instruction provided by verbal instruction, video, and written materials to support subject.  Instructors address signs and symptoms of angina and heart attacks.    Also discuss risk factors for heart disease and how to make changes to improve heart health risk factors.   Functional Fitness:  -Group instruction provided by verbal instruction, demonstration, patient participation, and written materials to support subject.  Instructors address safety measures for doing things around the house.  Discuss how to get up and down off the floor, how to pick things up properly, how to safely get out of a chair without assistance, and balance training.   Meditation and Mindfulness:  -Group instruction provided by verbal instruction, patient participation, and written materials to support subject.  Instructor addresses importance of mindfulness and meditation practice to help reduce stress and improve awareness.  Instructor also leads participants through a meditation exercise.    Stretching for Flexibility and Mobility:  -Group instruction provided by verbal instruction, patient participation, and written materials to support subject.  Instructors lead participants through series of stretches that are designed to increase flexibility thus improving mobility.  These stretches are additional exercise for major muscle groups that are typically performed during regular warm up and cool down.   Hands Only CPR:  -Group verbal, video, and participation provides a basic overview of AHA guidelines for community CPR. Role-play of emergencies allow participants the opportunity to practice calling for help and chest compression technique with discussion of AED use.   Hypertension: -Group verbal and written instruction that provides a basic overview of hypertension including the most recent diagnostic guidelines, risk  factor reduction with self-care instructions and medication management.    Nutrition I class: Heart Healthy Eating:  -Group instruction provided by PowerPoint slides, verbal discussion, and written materials to support subject matter. The instructor gives an explanation and review of the Therapeutic Lifestyle Changes diet recommendations, which includes a discussion on lipid goals, dietary fat, sodium, fiber, plant stanol/sterol esters, sugar, and the components of a well-balanced, healthy diet.   Nutrition II class: Lifestyle Skills:  -Group instruction provided by  PowerPoint slides, verbal discussion, and written materials to support subject matter. The instructor gives an explanation and review of label reading, grocery shopping for heart health, heart healthy recipe modifications, and ways to make healthier choices when eating out.   Diabetes Question & Answer:  -Group instruction provided by PowerPoint slides, verbal discussion, and written materials to support subject matter. The instructor gives an explanation and review of diabetes co-morbidities, pre- and post-prandial blood glucose goals, pre-exercise blood glucose goals, signs, symptoms, and treatment of hypoglycemia and hyperglycemia, and foot care basics.   Diabetes Blitz:  -Group instruction provided by PowerPoint slides, verbal discussion, and written materials to support subject matter. The instructor gives an explanation and review of the physiology behind type 1 and type 2 diabetes, diabetes medications and rational behind using different medications, pre- and post-prandial blood glucose recommendations and Hemoglobin A1c goals, diabetes diet, and exercise including blood glucose guidelines for exercising safely.    Portion Distortion:  -Group instruction provided by PowerPoint slides, verbal discussion, written materials, and food models to support subject matter. The instructor gives an explanation of serving size versus  portion size, changes in portions sizes over the last 20 years, and what consists of a serving from each food group.   Stress Management:  -Group instruction provided by verbal instruction, video, and written materials to support subject matter.  Instructors review role of stress in heart disease and how to cope with stress positively.     Exercising on Your Own:  -Group instruction provided by verbal instruction, power point, and written materials to support subject.  Instructors discuss benefits of exercise, components of exercise, frequency and intensity of exercise, and end points for exercise.  Also discuss use of nitroglycerin and activating EMS.  Review options of places to exercise outside of rehab.  Review guidelines for sex with heart disease.   Cardiac Drugs I:  -Group instruction provided by verbal instruction and written materials to support subject.  Instructor reviews cardiac drug classes: antiplatelets, anticoagulants, beta blockers, and statins.  Instructor discusses reasons, side effects, and lifestyle considerations for each drug class.   Cardiac Drugs II:  -Group instruction provided by verbal instruction and written materials to support subject.  Instructor reviews cardiac drug classes: angiotensin converting enzyme inhibitors (ACE-I), angiotensin II receptor blockers (ARBs), nitrates, and calcium channel blockers.  Instructor discusses reasons, side effects, and lifestyle considerations for each drug class.   Anatomy and Physiology of the Circulatory System:  Group verbal and written instruction and models provide basic cardiac anatomy and physiology, with the coronary electrical and arterial systems. Review of: AMI, Angina, Valve disease, Heart Failure, Peripheral Artery Disease, Cardiac Arrhythmia, Pacemakers, and the ICD.   Other Education:  -Group or individual verbal, written, or video instructions that support the educational goals of the cardiac rehab  program.   Holiday Eating Survival Tips:  -Group instruction provided by PowerPoint slides, verbal discussion, and written materials to support subject matter. The instructor gives patients tips, tricks, and techniques to help them not only survive but enjoy the holidays despite the onslaught of food that accompanies the holidays.   Knowledge Questionnaire Score:  Knowledge Questionnaire Score - 04/27/20 1600      Knowledge Questionnaire Score   Pre Score 19/24           Core Components/Risk Factors/Patient Goals at Admission:  Personal Goals and Risk Factors at Admission - 04/27/20 1724      Core Components/Risk Factors/Patient Goals on Admission    Weight Management  Yes;Obesity    Stress Yes    Intervention Offer individual and/or small group education and counseling on adjustment to heart disease, stress management and health-related lifestyle change. Teach and support self-help strategies.;Refer participants experiencing significant psychosocial distress to appropriate mental health specialists for further evaluation and treatment. When possible, include family members and significant others in education/counseling sessions.    Expected Outcomes Short Term: Participant demonstrates changes in health-related behavior, relaxation and other stress management skills, ability to obtain effective social support, and compliance with psychotropic medications if prescribed.;Long Term: Emotional wellbeing is indicated by absence of clinically significant psychosocial distress or social isolation.           Core Components/Risk Factors/Patient Goals Review:    Core Components/Risk Factors/Patient Goals at Discharge (Final Review):    ITP Comments:  ITP Comments    Row Name 04/27/20 1455           ITP Comments Dr. Armanda Magic, Medical Director              Comments: Patient attended orientation on 04/27/2020 to review rules and guidelines for program.  Completed 6 minute walk  test, Intitial ITP, and exercise prescription.  VSS. Telemetry-SR with no ectopy. Pt with bilateral hip pain which resolved with rest. Safety measures and social distancing in place per CDC guidelines. Pt will return on 8/16 for exercise. Alanson Aly, BSN Cardiac and Emergency planning/management officer

## 2020-04-28 ENCOUNTER — Ambulatory Visit (HOSPITAL_COMMUNITY): Payer: 59

## 2020-04-29 ENCOUNTER — Other Ambulatory Visit (HOSPITAL_COMMUNITY): Payer: Self-pay | Admitting: Cardiology

## 2020-05-01 ENCOUNTER — Ambulatory Visit (HOSPITAL_COMMUNITY): Payer: 59

## 2020-05-01 ENCOUNTER — Other Ambulatory Visit: Payer: Self-pay

## 2020-05-01 ENCOUNTER — Encounter (HOSPITAL_COMMUNITY)
Admission: RE | Admit: 2020-05-01 | Discharge: 2020-05-01 | Disposition: A | Discharge status: Home / Self Care | Payer: 59 | Source: Ambulatory Visit | Attending: Cardiology | Admitting: Cardiology

## 2020-05-01 DIAGNOSIS — I5022 Chronic systolic (congestive) heart failure: Secondary | ICD-10-CM | POA: Diagnosis present

## 2020-05-01 NOTE — Progress Notes (Signed)
Daily Session Note  Patient Details  Name: John [name] MRN: [id number] Date of Birth: July 15, 1961 Referring Provider:     [facility] from 04/27/2020 in [place]  Referring Provider Loralie Champagne, MD      Encounter Date: 05/01/2020  Check In:  Session Check In - 05/01/20 0750      Check-In   Supervising physician immediately available to respond to emergencies [facility] immediately available    Physician(s) Dr. Alfredia Ferguson    Location [facility]    Staff Present Dorma Russell, MS,ACSM CEP, Exercise Physiologist;Carlette Wilber Oliphant, RN, Deland Pretty, MS, ACSM CEP, Exercise Physiologist;Eathon Valade Rollene Rotunda, RN, BSN    Virtual Visit No    Medication changes reported     No    Fall or balance concerns reported    No    Tobacco Cessation No Change    Warm-up and Cool-down Performed on first and last piece of equipment    Resistance Training Performed Yes    VAD Patient? No    PAD/SET Patient? No      Pain Assessment   Currently in Pain? No/denies    Multiple Pain Sites No           Capillary Blood Glucose: No results found for this or any previous visit (from the past 24 hour(s)).    Social History   Tobacco Use  Smoking Status Never Smoker  Smokeless Tobacco Never Used    Goals Met:  Exercise tolerated well Personal goals reviewed No report of cardiac concerns or symptoms  Goals Unmet:  Not Applicable  Comments: Pt started cardiac rehab today.  Pt tolerated light exercise without difficulty. VSS, telemetry-NSR/BBB, asymptomatic.  Medication list reconciled. Pt denies barriers to medicaiton compliance.  PSYCHOSOCIAL ASSESSMENT:  PHQ9-2. Pt exhibits positive coping skills, hopeful outlook with supportive family. No psychosocial needs identified at this time, no psychosocial interventions necessary however will continue to monitor for s/s of depression and stress as patient has a history.   Pt oriented to exercise equipment and routine. Understanding verbalized.   Dr. Fransico Him is Medical Director for Cardiac Rehab at [facility].

## 2020-05-03 ENCOUNTER — Telehealth (HOSPITAL_COMMUNITY): Payer: Self-pay

## 2020-05-03 ENCOUNTER — Encounter (HOSPITAL_COMMUNITY): Payer: 59

## 2020-05-03 ENCOUNTER — Ambulatory Visit (HOSPITAL_COMMUNITY): Payer: 59

## 2020-05-03 NOTE — Telephone Encounter (Signed)
Cardiac Rehab Note:  Returned Mr. Loder's call to discuss today's call out from Cardiac Rehab secondary to illness. Mr. Munford states he is running a "low grade temp", "terrible HA", "stomach" discomfort. States he feels like he has "food poisoning". These symptoms began last night. Patient states he is fully vaccinated against Covid-19. He is also heard coughing while on the phone.  In response to the rapid rise of Covid-19 cases and the Delta variant, CR RN suggest patient be tested for the virus. Patient agreeable however he will not seek testing today (wants to stay in bed). Per [facility] Cardiac Rehab policy, patient is instructed to provide negative test AND be symptom free for 48 hours without medications before returning to [facility], OR if he chooses to not be tested he may return to [facility] September 1 (if symptom free). This will be 14 days from onset of symptoms per CDC recommendations. Patient agreeable. Patient is also instructed on when to seek emergency care if symptoms worsen including significant shortness of breath that does not resolve with rest or new onset of chest discomfort.  Will continue to follow patient in his absence from CR.  Mardene Lessig E. Suzie Portela RN, BSN . [facility]  Cardiac and Pulmonary Rehabilitation Phone: [phone] Fax: [phone]

## 2020-05-04 ENCOUNTER — Other Ambulatory Visit (HOSPITAL_COMMUNITY): Payer: Self-pay | Admitting: Cardiology

## 2020-05-04 ENCOUNTER — Ambulatory Visit (INDEPENDENT_AMBULATORY_CARE_PROVIDER_SITE_OTHER): Payer: 59 | Admitting: *Deleted

## 2020-05-04 DIAGNOSIS — I5021 Acute systolic (congestive) heart failure: Secondary | ICD-10-CM | POA: Diagnosis not present

## 2020-05-04 LAB — CUP PACEART REMOTE DEVICE CHECK
Battery Remaining Longevity: 84 mo
Battery Remaining Percentage: 82 %
Battery Voltage: 3.01 V
Brady Statistic RV Percent Paced: 1 %
Date Time Interrogation Session: 20210819020017
HighPow Impedance: 79 Ohm
HighPow Impedance: 79 Ohm
Lead Channel Impedance Value: 440 Ohm
Lead Channel Pacing Threshold Amplitude: 0.75 V
Lead Channel Pacing Threshold Pulse Width: 0.5 ms
Lead Channel Sensing Intrinsic Amplitude: 12 mV
Lead Channel Setting Pacing Amplitude: 2.5 V
Lead Channel Setting Pacing Pulse Width: 0.5 ms
Lead Channel Setting Sensing Sensitivity: 0.5 mV
Pulse Gen Serial Number: 9824800

## 2020-05-05 ENCOUNTER — Ambulatory Visit (HOSPITAL_COMMUNITY): Payer: 59

## 2020-05-05 ENCOUNTER — Encounter (HOSPITAL_COMMUNITY): Payer: 59

## 2020-05-05 NOTE — Progress Notes (Signed)
Remote ICD transmission.   

## 2020-05-08 ENCOUNTER — Telehealth (HOSPITAL_COMMUNITY): Payer: Self-pay

## 2020-05-08 ENCOUNTER — Ambulatory Visit (HOSPITAL_COMMUNITY): Payer: 59

## 2020-05-08 ENCOUNTER — Other Ambulatory Visit (HOSPITAL_COMMUNITY): Payer: Self-pay | Admitting: Family Medicine

## 2020-05-08 ENCOUNTER — Encounter (HOSPITAL_COMMUNITY): Payer: 59

## 2020-05-08 NOTE — Telephone Encounter (Signed)
Cardiac Rehab Note:  Unsuccessful telephone call per patient request to [name] to discuss recent Covid test results. [employer] compliant VM message left requesting call back to [phone].  Adelayde Minney E. Suzie Portela RN, BSN [place]. [facility]  Cardiac and Pulmonary Rehabilitation Phone: [phone] Fax: [phone]

## 2020-05-09 ENCOUNTER — Telehealth (HOSPITAL_COMMUNITY): Payer: Self-pay

## 2020-05-09 NOTE — Telephone Encounter (Signed)
Cardiac Rehab Note:  Incoming call from [name] requesting to return to cardiac rehab exercise tomorrow after recent "illness". John [name] states his covid test was negative and will bring proof of such tomorrow. Also states he is symptom free without medications. Plan is for him to return to his 7:15 am exercise session, arriving at 7:00am for check in.  John [name] E. Suzie Portela RN, BSN [place]. [facility]  Cardiac and Pulmonary Rehabilitation Phone: [phone] Fax: [phone]

## 2020-05-09 NOTE — Telephone Encounter (Signed)
Last Rx was refilled on 02/01/20 30 day supply.   Last appt. Was on 04/14/20 ok to refill

## 2020-05-10 ENCOUNTER — Ambulatory Visit (HOSPITAL_COMMUNITY): Payer: 59

## 2020-05-10 ENCOUNTER — Other Ambulatory Visit: Payer: Self-pay

## 2020-05-10 ENCOUNTER — Encounter (HOSPITAL_COMMUNITY)
Admission: RE | Admit: 2020-05-10 | Discharge: 2020-05-10 | Disposition: A | Discharge status: Home / Self Care | Payer: 59 | Source: Ambulatory Visit | Attending: Cardiology | Admitting: Cardiology

## 2020-05-10 DIAGNOSIS — I5022 Chronic systolic (congestive) heart failure: Secondary | ICD-10-CM | POA: Diagnosis not present

## 2020-05-12 ENCOUNTER — Ambulatory Visit (HOSPITAL_COMMUNITY): Payer: 59

## 2020-05-12 ENCOUNTER — Encounter (HOSPITAL_COMMUNITY)
Admission: RE | Admit: 2020-05-12 | Discharge: 2020-05-12 | Disposition: A | Discharge status: Home / Self Care | Payer: 59 | Source: Ambulatory Visit | Attending: Cardiology | Admitting: Cardiology

## 2020-05-12 ENCOUNTER — Other Ambulatory Visit: Payer: Self-pay

## 2020-05-12 DIAGNOSIS — I5022 Chronic systolic (congestive) heart failure: Secondary | ICD-10-CM

## 2020-05-14 ENCOUNTER — Other Ambulatory Visit (HOSPITAL_COMMUNITY): Payer: Self-pay | Admitting: Family Medicine

## 2020-05-15 ENCOUNTER — Encounter (HOSPITAL_COMMUNITY)
Admission: RE | Admit: 2020-05-15 | Discharge: 2020-05-15 | Disposition: A | Discharge status: Home / Self Care | Payer: 59 | Source: Ambulatory Visit | Attending: Cardiology | Admitting: Cardiology

## 2020-05-15 ENCOUNTER — Ambulatory Visit (HOSPITAL_COMMUNITY): Payer: 59

## 2020-05-15 ENCOUNTER — Other Ambulatory Visit: Payer: Self-pay

## 2020-05-15 DIAGNOSIS — I5022 Chronic systolic (congestive) heart failure: Secondary | ICD-10-CM | POA: Diagnosis not present

## 2020-05-17 ENCOUNTER — Encounter (HOSPITAL_COMMUNITY)
Admission: RE | Admit: 2020-05-17 | Discharge: 2020-05-17 | Disposition: A | Discharge status: Home / Self Care | Payer: 59 | Source: Ambulatory Visit | Attending: Cardiology | Admitting: Cardiology

## 2020-05-17 ENCOUNTER — Ambulatory Visit (HOSPITAL_COMMUNITY): Payer: 59

## 2020-05-17 ENCOUNTER — Other Ambulatory Visit: Payer: Self-pay

## 2020-05-17 DIAGNOSIS — I5022 Chronic systolic (congestive) heart failure: Secondary | ICD-10-CM | POA: Insufficient documentation

## 2020-05-19 ENCOUNTER — Ambulatory Visit (HOSPITAL_COMMUNITY): Payer: 59

## 2020-05-19 ENCOUNTER — Encounter (HOSPITAL_COMMUNITY): Payer: 59

## 2020-05-23 ENCOUNTER — Encounter (HOSPITAL_COMMUNITY): Payer: Self-pay

## 2020-05-23 DIAGNOSIS — I5022 Chronic systolic (congestive) heart failure: Secondary | ICD-10-CM

## 2020-05-23 NOTE — Progress Notes (Signed)
Cardiac Individual Treatment Plan  Patient Details  Name: John [name] MRN: [id number] Date of Birth: May 17, 1961 Referring Provider:     CARDIAC REHAB PHASE II ORIENTATION from 04/27/2020 in [facility] CARDIAC REHAB  Referring Provider Marca Ancona, MD      Initial Encounter Date:    CARDIAC REHAB PHASE II ORIENTATION from 04/27/2020 in [facility] CARDIAC REHAB  Date 04/27/20      Visit Diagnosis: Heart failure, chronic systolic (HCC)  Patient's Home Medications on Admission:  Current Outpatient Medications:  .  carvedilol (COREG) 25 MG tablet, Take 1 tablet (25 mg total) by mouth 2 (two) times daily with a meal., Disp: 60 tablet, Rfl: 3 .  dapagliflozin propanediol (FARXIGA) 10 MG TABS tablet, Take 10 mg by mouth daily., Disp: , Rfl:  .  ELIQUIS 5 MG TABS tablet, TAKE 1 TABLET BY MOUTH TWICE A DAY, Disp: 180 tablet, Rfl: 3 .  furosemide (LASIX) 40 MG tablet, Take 1 tablet (40 mg total) by mouth every morning AND 0.5 tablets (20 mg total) every evening., Disp: 135 tablet, Rfl: 3 .  icosapent Ethyl (VASCEPA) 1 g capsule, Take 2 capsules (2 g total) by mouth 2 (two) times daily., Disp: 120 capsule, Rfl: 3 .  Multiple Vitamins-Minerals (MULTIVITAMIN WITH MINERALS) tablet, Take 1 tablet by mouth daily., Disp: , Rfl:  .  rosuvastatin (CRESTOR) 20 MG tablet, TAKE 1 TABLET BY MOUTH EVERY DAY, Disp: 90 tablet, Rfl: 3 .  sacubitril-valsartan (ENTRESTO) 97-103 MG, Take 1 tablet by mouth 2 (two) times daily., Disp: 60 tablet, Rfl: 5 .  sertraline (ZOLOFT) 50 MG tablet, TAKE 1 TABLET BY MOUTH EVERY DAY **NEED TO CONTACT PRIMARY DR FOR REFILLS**, Disp: 30 tablet, Rfl: 0 .  sildenafil (VIAGRA) 100 MG tablet, TAKE 0.5 TABLETS (50 MG TOTAL) BY MOUTH AT BEDTIME AS NEEDED FOR ERECTILE DYSFUNCTION., Disp: 10 tablet, Rfl: 3 .  spironolactone (ALDACTONE) 25 MG tablet, Take 25 mg by mouth daily., Disp: , Rfl:  .  zolpidem (AMBIEN) 5 MG tablet, Take 1 tablet (5 mg  total) by mouth at bedtime as needed for sleep., Disp: 30 tablet, Rfl: 5  Past Medical History: Past Medical History:  Diagnosis Date  . Anxiety    Ativan  . Chronic combined systolic and diastolic CHF, NYHA class 3 (HCC) 10/2016   Nonischemic cardiomyopathy. EF 20-25%.  . Chronic left hip pain   . Depression    history of  . Dysrhythmia    A-FIB  . GI bleed 03/2016  . Headache   . Hypertensive heart disease with combined systolic and diastolic congestive heart failure (HCC) 10/2016  . Nonischemic cardiomyopathy (HCC) 10/2016   Echo with EF 20-25%. Cardiac catheterization with no CAD. LVEDP was 41 mmHg, PCWP 36 mmHg  . Osteoarthritis    right shoulder  . Right hand fracture   . Seasonal allergies   . Sleep apnea    wears a CPAP  . Wears glasses     Tobacco Use: Social History   Tobacco Use  Smoking Status Never Smoker  Smokeless Tobacco Never Used    Labs: Recent Review Flowsheet Data    Labs for ITP Cardiac and Pulmonary Rehab Latest Ref Rng & Units 06/09/2018 09/01/2019 09/14/2019 03/30/2020 04/21/2020   Cholestrol 0 - 200 mg/dL 409 - - 811 -   LDLCALC 0 - 99 mg/dL 45 - - 24 -   HDL >91 mg/dL 53 - - 48 -   Trlycerides <150 mg/dL 66 - -  394(H) -   Hemoglobin A1c <5.7 % of total Hgb - - 5.5 - 5.5   PHART 7.35 - 7.45 - - - - -   PCO2ART 32 - 48 mmHg - - - - -   HCO3 20.0 - 28.0 mmol/L - - - - -   TCO2 22 - 32 mmol/L - 24 - - -   O2SAT % - - - - -      Capillary Blood Glucose: Lab Results  Component Value Date   GLUCAP 113 (H) 11/11/2019   GLUCAP 130 (H) 10/19/2019   GLUCAP 108 (H) 10/19/2019     Exercise Target Goals: Exercise Program Goal: Individual exercise prescription set using results from initial 6 min walk test and THRR while considering  patient's activity barriers and safety.   Exercise Prescription Goal: Initial exercise prescription builds to 30-45 minutes a day of aerobic activity, 2-3 days per week.  Home exercise guidelines will be given to  patient during program as part of exercise prescription that the participant will acknowledge.  Activity Barriers & Risk Stratification:  Activity Barriers & Cardiac Risk Stratification - 04/27/20 1546      Activity Barriers & Cardiac Risk Stratification   Activity Barriers Other (comment);Left Hip Replacement;Joint Problems;Deconditioning    Cardiac Risk Stratification High           6 Minute Walk:  6 Minute Walk    Row Name 04/27/20 1543         6 Minute Walk   Phase Initial     Distance 1507 feet     Walk Time 6 minutes     # of Rest Breaks 0     MPH 2.9     METS 3.1     RPE 11     Perceived Dyspnea  1     VO2 Peak 10.8     Symptoms Yes (comment)     Comments Pt reported SOB +1, bilateral hip pain 6/10     Resting HR 71 bpm     Resting BP 122/78     Resting Oxygen Saturation  97 %     Exercise Oxygen Saturation  during 6 min walk 96 %     Max Ex. HR 115 bpm     Max Ex. BP 150/82     2 Minute Post BP 108/62            Oxygen Initial Assessment:   Oxygen Re-Evaluation:   Oxygen Discharge (Final Oxygen Re-Evaluation):   Initial Exercise Prescription:  Initial Exercise Prescription - 04/27/20 1500      Date of Initial Exercise RX and Referring Provider   Date 04/27/20    Referring Provider Marca Ancona, MD    Expected Discharge Date 06/23/20      NuStep   Level 2    SPM 75    Minutes 15    METs 2.5      Track   Laps 12    Minutes 15    METs 2.39      Prescription Details   Frequency (times per week) 3x    Duration Progress to 10 minutes continuous walking  at current work load and total walking time to 30-45 min      Intensity   THRR 40-80% of Max Heartrate 65-130    Ratings of Perceived Exertion 11-13    Perceived Dyspnea 0-4      Progression   Progression Continue progressive overload as per policy without signs/symptoms or physical  distress.      Resistance Training   Training Prescription Yes    Weight 3lbs    Reps 10-15            Perform Capillary Blood Glucose checks as needed.  Exercise Prescription Changes:   Exercise Prescription Changes    Row Name 05/01/20 0726 05/17/20 0709           Response to Exercise   Blood Pressure (Admit) 106/74 140/60      Blood Pressure (Exercise) 124/78 124/62      Blood Pressure (Exit) 116/72 128/70      Heart Rate (Admit) 81 bpm 84 bpm      Heart Rate (Exercise) 101 bpm 113 bpm      Heart Rate (Exit) 82 bpm 84 bpm      Rating of Perceived Exertion (Exercise) 12 12      Symptoms Low back pain --      Comments Patient tolerated exericse fair with some low back pain with walking. --      Duration Progress to 30 minutes of  aerobic without signs/symptoms of physical distress Progress to 30 minutes of  aerobic without signs/symptoms of physical distress      Intensity THRR unchanged THRR unchanged        Progression   Progression Continue to progress workloads to maintain intensity without signs/symptoms of physical distress. Continue to progress workloads to maintain intensity without signs/symptoms of physical distress.      Average METs 2.4 2.6        Resistance Training   Training Prescription No No        Interval Training   Interval Training No No        NuStep   Level 2 2      SPM 75 75      Minutes 15 15      METs 1.9 2.4        Track   Laps 16 16      Minutes 10 10      METs 2.86 2.86             Exercise Comments:   Exercise Comments    Row Name 05/01/20 0808 05/10/20 0750         Exercise Comments Patient tolerated low intensity fair c/o low back pain after second station. Reviewed METs with patient.             Exercise Goals and Review:   Exercise Goals    Row Name 04/27/20 1456             Exercise Goals   Increase Physical Activity Yes       Intervention Provide advice, education, support and counseling about physical activity/exercise needs.;Develop an individualized exercise prescription for aerobic and  resistive training based on initial evaluation findings, risk stratification, comorbidities and participant's personal goals.       Expected Outcomes Short Term: Attend rehab on a regular basis to increase amount of physical activity.;Long Term: Exercising regularly at least 3-5 days a week.;Long Term: Add in home exercise to make exercise part of routine and to increase amount of physical activity.       Increase Strength and Stamina Yes       Intervention Provide advice, education, support and counseling about physical activity/exercise needs.;Develop an individualized exercise prescription for aerobic and resistive training based on initial evaluation findings, risk stratification, comorbidities and participant's personal goals.       Expected Outcomes  Short Term: Increase workloads from initial exercise prescription for resistance, speed, and METs.;Short Term: Perform resistance training exercises routinely during rehab and add in resistance training at home;Long Term: Improve cardiorespiratory fitness, muscular endurance and strength as measured by increased METs and functional capacity ( )       Able to understand and use rate of perceived exertion (RPE) scale Yes       Intervention Provide education and explanation on how to use RPE scale       Expected Outcomes Short Term: Able to use RPE daily in rehab to express subjective intensity level;Long Term:  Able to use RPE to guide intensity level when exercising independently       Knowledge and understanding of Target Heart Rate Range (THRR) Yes       Intervention Provide education and explanation of THRR including how the numbers were predicted and where they are located for reference       Expected Outcomes Short Term: Able to state/look up THRR;Long Term: Able to use THRR to govern intensity when exercising independently;Short Term: Able to use daily as guideline for intensity in rehab       Able to check pulse independently Yes        Intervention Provide education and demonstration on how to check pulse in carotid and radial arteries.;Review the importance of being able to check your own pulse for safety during independent exercise       Expected Outcomes Short Term: Able to explain why pulse checking is important during independent exercise;Long Term: Able to check pulse independently and accurately       Understanding of Exercise Prescription Yes       Intervention Provide education, explanation, and written materials on patient's individual exercise prescription       Expected Outcomes Short Term: Able to explain program exercise prescription;Long Term: Able to explain home exercise prescription to exercise independently              Exercise Goals Re-Evaluation :  Exercise Goals Re-Evaluation    Row Name 05/01/20 0808 05/24/20 0700           Exercise Goal Re-Evaluation   Exercise Goals Review Increase Physical Activity;Able to understand and use rate of perceived exertion (RPE) scale --      Comments Patient able to understand and use RPE scale appropriately. Patient called out today due to family matter. Unable to review METs and goals.      Expected Outcomes Increase workloads as tolerated to help improve cardiorespiratory fitness. --             Discharge Exercise Prescription (Final Exercise Prescription Changes):  Exercise Prescription Changes - 05/17/20 0709      Response to Exercise   Blood Pressure (Admit) 140/60    Blood Pressure (Exercise) 124/62    Blood Pressure (Exit) 128/70    Heart Rate (Admit) 84 bpm    Heart Rate (Exercise) 113 bpm    Heart Rate (Exit) 84 bpm    Rating of Perceived Exertion (Exercise) 12    Duration Progress to 30 minutes of  aerobic without signs/symptoms of physical distress    Intensity THRR unchanged      Progression   Progression Continue to progress workloads to maintain intensity without signs/symptoms of physical distress.    Average METs 2.6       Resistance Training   Training Prescription No      Interval Training   Interval Training No  NuStep   Level 2    SPM 75    Minutes 15    METs 2.4      Track   Laps 16    Minutes 10    METs 2.86           Nutrition:  Target Goals: Understanding of nutrition guidelines, daily intake of sodium 1500mg , cholesterol 200mg , calories 30% from fat and 7% or less from saturated fats, daily to have 5 or more servings of fruits and vegetables.  Biometrics:  Pre Biometrics - 04/27/20 1545      Pre Biometrics   Height 5\' 10"  (1.778 m)    Weight 270 lb 1 oz (122.5 kg)    Waist Circumference 54 inches    Hip Circumference 51 inches    Waist to Hip Ratio 1.06 %    BMI (Calculated) 38.75    Triceps Skinfold 35 mm    % Body Fat 41.8 %    Grip Strength 34 kg    Flexibility 0 in    Single Leg Stand 0 seconds            Nutrition Therapy Plan and Nutrition Goals:   Nutrition Assessments:   Nutrition Goals Re-Evaluation:   Nutrition Goals Re-Evaluation:   Nutrition Goals Discharge (Final Nutrition Goals Re-Evaluation):   Psychosocial: Target Goals: Acknowledge presence or absence of significant depression and/or stress, maximize coping skills, provide positive support system. Participant is able to verbalize types and ability to use techniques and skills needed for reducing stress and depression.  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  Feels depressed by his present health concearns      Family Dynamics   Good Support System? Yes    Comments Pt has children and girlfriend who are supportive of his efforts to increase his activity and  make nutritional changes in his diet.      Barriers   Psychosocial barriers to participate in program The patient should benefit from training in stress management and relaxation.      Screening Interventions   Interventions Encouraged to exercise           Quality of Life Scores:  Quality of Life - 04/27/20 1600      Quality of Life   Select Quality of Life      Quality of Life Scores   Health/Function Pre 16.77 %    Socioeconomic Pre 24.25 %    Psych/Spiritual Pre 21.43 %    Family Pre 25.2 %    GLOBAL Pre 20.61 %          Scores of 19 and below usually indicate a poorer quality of life in these areas.  A difference of  2-3 points is a clinically meaningful difference.  A difference of 2-3 points in the total score of the Quality of Life Index has been associated with significant improvement in overall quality of life, self-image, physical symptoms, and general health in studies assessing change in quality of life.  PHQ-9: Recent Review Flowsheet Data    Depression screen [facility] 2/9 04/27/2020 01/20/2020 12/06/2019   Decreased Interest 0 0 0   Down, Depressed, Hopeless 0 0 0   PHQ -  2 Score 0 0 0   Altered sleeping 0 - -   Tired, decreased energy 1 - -   Change in appetite 0 - -   Feeling bad or failure about yourself  1 - -   Trouble concentrating 0 - -   Moving slowly or fidgety/restless 0 - -   Suicidal thoughts 0 - -   PHQ-9 Score 2 - -   Difficult doing work/chores Not difficult at all - -     Interpretation of Total Score  Total Score Depression Severity:  1-4 = Minimal depression, 5-9 = Mild depression, 10-14 = Moderate depression, 15-19 = Moderately severe depression, 20-27 = Severe depression   Psychosocial Evaluation and Intervention:  Psychosocial Evaluation - 05/01/20 0910      Psychosocial Evaluation & Interventions   Interventions Encouraged to exercise with the program and follow exercise prescription;Stress management education;Relaxation education     Comments Patient presents to cardiac rehab with a positive attitude and states he is "glad to be here". John [name] admits to a history of depression and stress related to his current health status and occupation. 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 He has a live in girlfriend that provides patient with emotional support.    Expected Outcomes Pt will exhibit a positive outlook with good coping skills.    Continue Psychosocial Services  Follow up required by staff           Psychosocial Re-Evaluation:  Psychosocial Re-Evaluation    Row Name 05/23/20 1537             Psychosocial Re-Evaluation   Current issues with History of Depression;Current Stress Concerns       Comments John [name] admits to stress related to his inability to do things as he used to prior to decrease in heart function and shoulder surgeries. He has a very supportive family including children and girlfriend. He hopes the socialization and motiviation he recieves in cardiac rehab will help his mental health and outlook on life. He denies psychosocial needs at this time.       Expected Outcomes Pt will exhibit a postive outlook with good coping skills.       Interventions Encouraged to attend Cardiac Rehabilitation for the exercise       Continue Psychosocial Services  Follow up required by staff              Psychosocial Discharge (Final Psychosocial Re-Evaluation):  Psychosocial Re-Evaluation - 05/23/20 1537      Psychosocial Re-Evaluation   Current issues with History of Depression;Current Stress Concerns    Comments John [name] admits to stress related to his inability to do things as he used to prior to decrease in heart function and shoulder surgeries. He has a very supportive family including children and girlfriend. He hopes the socialization and motiviation he recieves in cardiac rehab will help his mental health and outlook on life. He denies psychosocial  needs at this time.    Expected Outcomes Pt will exhibit a postive outlook with good coping skills.    Interventions Encouraged to attend Cardiac Rehabilitation for the exercise    Continue Psychosocial Services  Follow up required by staff           Vocational Rehabilitation: Provide vocational rehab assistance to qualifying candidates.   Vocational Rehab Evaluation & Intervention:  Vocational Rehab - 04/27/20 1724      Initial Vocational Rehab Evaluation & Intervention  Assessment shows need for Vocational Rehabilitation No           Education: Education Goals: Education classes will be provided on a weekly basis, covering required topics. Participant will state understanding/return demonstration of topics presented.  Learning Barriers/Preferences:  Learning Barriers/Preferences - 04/27/20 1603      Learning Barriers/Preferences   Learning Barriers Sight    Learning Preferences Skilled Demonstration;Verbal Instruction;Video           Education Topics: Count Your Pulse:  -Group instruction provided by verbal instruction, demonstration, patient participation and written materials to support subject.  Instructors address importance of being able to find your pulse and how to count your pulse when at home without a heart monitor.  Patients get hands on experience counting their pulse with staff help and individually.   Heart Attack, Angina, and Risk Factor Modification:  -Group instruction provided by verbal instruction, video, and written materials to support subject.  Instructors address signs and symptoms of angina and heart attacks.    Also discuss risk factors for heart disease and how to make changes to improve heart health risk factors.   Functional Fitness:  -Group instruction provided by verbal instruction, demonstration, patient participation, and written materials to support subject.  Instructors address safety measures for doing things around the house.   Discuss how to get up and down off the floor, how to pick things up properly, how to safely get out of a chair without assistance, and balance training.   Meditation and Mindfulness:  -Group instruction provided by verbal instruction, patient participation, and written materials to support subject.  Instructor addresses importance of mindfulness and meditation practice to help reduce stress and improve awareness.  Instructor also leads participants through a meditation exercise.    Stretching for Flexibility and Mobility:  -Group instruction provided by verbal instruction, patient participation, and written materials to support subject.  Instructors lead participants through series of stretches that are designed to increase flexibility thus improving mobility.  These stretches are additional exercise for major muscle groups that are typically performed during regular warm up and cool down.   Hands Only CPR:  -Group verbal, video, and participation provides a basic overview of AHA guidelines for community CPR. Role-play of emergencies allow participants the opportunity to practice calling for help and chest compression technique with discussion of AED use.   Hypertension: -Group verbal and written instruction that provides a basic overview of hypertension including the most recent diagnostic guidelines, risk factor reduction with self-care instructions and medication management.    Nutrition I class: Heart Healthy Eating:  -Group instruction provided by PowerPoint slides, verbal discussion, and written materials to support subject matter. The instructor gives an explanation and review of the Therapeutic Lifestyle Changes diet recommendations, which includes a discussion on lipid goals, dietary fat, sodium, fiber, plant stanol/sterol esters, sugar, and the components of a well-balanced, healthy diet.   Nutrition II class: Lifestyle Skills:  -Group instruction provided by PowerPoint slides,  verbal discussion, and written materials to support subject matter. The instructor gives an explanation and review of label reading, grocery shopping for heart health, heart healthy recipe modifications, and ways to make healthier choices when eating out.   Diabetes Question & Answer:  -Group instruction provided by PowerPoint slides, verbal discussion, and written materials to support subject matter. The instructor gives an explanation and review of diabetes co-morbidities, pre- and post-prandial blood glucose goals, pre-exercise blood glucose goals, signs, symptoms, and treatment of hypoglycemia and hyperglycemia, and foot care  basics.   Diabetes Blitz:  -Group instruction provided by PowerPoint slides, verbal discussion, and written materials to support subject matter. The instructor gives an explanation and review of the physiology behind type 1 and type 2 diabetes, diabetes medications and rational behind using different medications, pre- and post-prandial blood glucose recommendations and Hemoglobin A1c goals, diabetes diet, and exercise including blood glucose guidelines for exercising safely.    Portion Distortion:  -Group instruction provided by PowerPoint slides, verbal discussion, written materials, and food models to support subject matter. The instructor gives an explanation of serving size versus portion size, changes in portions sizes over the last 20 years, and what consists of a serving from each food group.   Stress Management:  -Group instruction provided by verbal instruction, video, and written materials to support subject matter.  Instructors review role of stress in heart disease and how to cope with stress positively.     Exercising on Your Own:  -Group instruction provided by verbal instruction, power point, and written materials to support subject.  Instructors discuss benefits of exercise, components of exercise, frequency and intensity of exercise, and end points for  exercise.  Also discuss use of nitroglycerin and activating EMS.  Review options of places to exercise outside of rehab.  Review guidelines for sex with heart disease.   Cardiac Drugs I:  -Group instruction provided by verbal instruction and written materials to support subject.  Instructor reviews cardiac drug classes: antiplatelets, anticoagulants, beta blockers, and statins.  Instructor discusses reasons, side effects, and lifestyle considerations for each drug class.   Cardiac Drugs II:  -Group instruction provided by verbal instruction and written materials to support subject.  Instructor reviews cardiac drug classes: angiotensin converting enzyme inhibitors (ACE-I), angiotensin II receptor blockers (ARBs), nitrates, and calcium channel blockers.  Instructor discusses reasons, side effects, and lifestyle considerations for each drug class.   Anatomy and Physiology of the Circulatory System:  Group verbal and written instruction and models provide basic cardiac anatomy and physiology, with the coronary electrical and arterial systems. Review of: AMI, Angina, Valve disease, Heart Failure, Peripheral Artery Disease, Cardiac Arrhythmia, Pacemakers, and the ICD.   Other Education:  -Group or individual verbal, written, or video instructions that support the educational goals of the cardiac rehab program.   Holiday Eating Survival Tips:  -Group instruction provided by PowerPoint slides, verbal discussion, and written materials to support subject matter. The instructor gives patients tips, tricks, and techniques to help them not only survive but enjoy the holidays despite the onslaught of food that accompanies the holidays.   Knowledge Questionnaire Score:  Knowledge Questionnaire Score - 04/27/20 1600      Knowledge Questionnaire Score   Pre Score 19/24           Core Components/Risk Factors/Patient Goals at Admission:  Personal Goals and Risk Factors at Admission - 04/27/20 1724       Core Components/Risk Factors/Patient Goals on Admission    Weight Management Yes;Obesity    Stress Yes    Intervention Offer individual and/or small group education and counseling on adjustment to heart disease, stress management and health-related lifestyle change. Teach and support self-help strategies.;Refer participants experiencing significant psychosocial distress to appropriate mental health specialists for further evaluation and treatment. When possible, include family members and significant others in education/counseling sessions.    Expected Outcomes Short Term: Participant demonstrates changes in health-related behavior, relaxation and other stress management skills, ability to obtain effective social support, and compliance with psychotropic medications if prescribed.;Long  Term: Emotional wellbeing is indicated by absence of clinically significant psychosocial distress or social isolation.           Core Components/Risk Factors/Patient Goals Review:   Goals and Risk Factor Review    Row Name 05/01/20 0913 05/23/20 1540           Core Components/Risk Factors/Patient Goals Review   Personal Goals Review Weight Management/Obesity;Hypertension;Lipids;Heart Failure;Stress Weight Management/Obesity;Hypertension;Lipids;Heart Failure;Stress      Review John [name] has multiple CAD risk factors. States he is glad to be enrolled in CR for exercise and risk factor modifications. Goals are to increase stamina and strength and improve heart function. Working towards a weight loss goal to loose approximately 40 lbs. John [name] has multiple CAD risk factors. States he is glad to be enrolled in CR for exercise and risk factor modifications. Goals are to increase stamina and strength and improve heart function. Working towards a weight loss goal to loose approximately 40 lbs.      Expected Outcomes Pt will continue to participate in CR exercise, nutrition, and lifestyle modification  opportunities once exercise resumes Pt will continue to participate in CR exercise, nutrition, and lifestyle modification opportunities once exercise resumes             Core Components/Risk Factors/Patient Goals at Discharge (Final Review):   Goals and Risk Factor Review - 05/23/20 1540      Core Components/Risk Factors/Patient Goals Review   Personal Goals Review Weight Management/Obesity;Hypertension;Lipids;Heart Failure;Stress    Review John [name] has multiple CAD risk factors. States he is glad to be enrolled in CR for exercise and risk factor modifications. Goals are to increase stamina and strength and improve heart function. Working towards a weight loss goal to loose approximately 40 lbs.    Expected Outcomes Pt will continue to participate in CR exercise, nutrition, and lifestyle modification opportunities once exercise resumes           ITP Comments:  ITP Comments    Row Name 04/27/20 1455 05/01/20 0906 05/23/20 1532       ITP Comments Dr. Armanda Magic, Medical Director John [name] completed his first cardiac rehab exercise session today and tolerated well. He did admit to having lower back pain 8/10 while walking the track. He did not utilize weights or stretches post aerobic exercise. VSS. CR goals are to increase stamina and strength and to improve heart function. He would also like to begin a weight loss program to reduce his weight to 220 lbs. Current weight 259 lb. 30 day ITP review: John [name] has completed 5 cardiac rehab exercise sessions since admission and tolerated well. C/O right hip pain with ambulation that subsides with rest. Average mets 2.2-2.7. John [name] hopes to increase his stamina and strength and improve his heart function through exercise. Will re-evaluate progression towares goals over the next 30 days. Mr. Yankee states he is enjoying cardiac rehab and the motivation provided by staff.            Comments: see ITP comments

## 2020-05-24 ENCOUNTER — Telehealth (HOSPITAL_COMMUNITY): Payer: Self-pay | Admitting: *Deleted

## 2020-05-24 ENCOUNTER — Ambulatory Visit (HOSPITAL_COMMUNITY): Payer: 59

## 2020-05-24 ENCOUNTER — Encounter (HOSPITAL_COMMUNITY): Payer: 59

## 2020-05-24 NOTE — Addendum Note (Signed)
Encounter addended by: Artist Pais on: 05/24/2020 10:02 AM  Actions taken: Flowsheet accepted

## 2020-05-26 ENCOUNTER — Ambulatory Visit (HOSPITAL_COMMUNITY): Payer: 59

## 2020-05-26 ENCOUNTER — Encounter (HOSPITAL_COMMUNITY)
Admission: RE | Admit: 2020-05-26 | Discharge: 2020-05-26 | Disposition: A | Discharge status: Home / Self Care | Payer: 59 | Source: Ambulatory Visit | Attending: Cardiology | Admitting: Cardiology

## 2020-05-26 ENCOUNTER — Other Ambulatory Visit: Payer: Self-pay

## 2020-05-26 DIAGNOSIS — I5022 Chronic systolic (congestive) heart failure: Secondary | ICD-10-CM | POA: Diagnosis not present

## 2020-05-26 NOTE — Progress Notes (Signed)
John [name] 59 y.o. male Nutrition Note  Visit Diagnosis: Heart failure, chronic systolic [facility])   Past Medical History:  Diagnosis Date  . Anxiety    Ativan  . Chronic combined systolic and diastolic CHF, NYHA class 3 (HCC) 10/2016   Nonischemic cardiomyopathy. EF 20-25%.  . Chronic left hip pain   . Depression    history of  . Dysrhythmia    A-FIB  . GI bleed 03/2016  . Headache   . Hypertensive heart disease with combined systolic and diastolic congestive heart failure (HCC) 10/2016  . Nonischemic cardiomyopathy (HCC) 10/2016   Echo with EF 20-25%. Cardiac catheterization with no CAD. LVEDP was 41 mmHg, PCWP 36 mmHg  . Osteoarthritis    right shoulder  . Right hand fracture   . Seasonal allergies   . Sleep apnea    wears a CPAP  . Wears glasses      Medications reviewed.   Current Outpatient Medications:  .  carvedilol (COREG) 25 MG tablet, Take 1 tablet (25 mg total) by mouth 2 (two) times daily with a meal., Disp: 60 tablet, Rfl: 3 .  dapagliflozin propanediol (FARXIGA) 10 MG TABS tablet, Take 10 mg by mouth daily., Disp: , Rfl:  .  ELIQUIS 5 MG TABS tablet, TAKE 1 TABLET BY MOUTH TWICE A DAY, Disp: 180 tablet, Rfl: 3 .  furosemide (LASIX) 40 MG tablet, Take 1 tablet (40 mg total) by mouth every morning AND 0.5 tablets (20 mg total) every evening., Disp: 135 tablet, Rfl: 3 .  icosapent Ethyl (VASCEPA) 1 g capsule, Take 2 capsules (2 g total) by mouth 2 (two) times daily., Disp: 120 capsule, Rfl: 3 .  Multiple Vitamins-Minerals (MULTIVITAMIN WITH MINERALS) tablet, Take 1 tablet by mouth daily., Disp: , Rfl:  .  rosuvastatin (CRESTOR) 20 MG tablet, TAKE 1 TABLET BY MOUTH EVERY DAY, Disp: 90 tablet, Rfl: 3 .  sacubitril-valsartan (ENTRESTO) 97-103 MG, Take 1 tablet by mouth 2 (two) times daily., Disp: 60 tablet, Rfl: 5 .  sertraline (ZOLOFT) 50 MG tablet, TAKE 1 TABLET BY MOUTH EVERY DAY **NEED TO CONTACT PRIMARY DR FOR REFILLS**, Disp: 30 tablet, Rfl: 0 .   sildenafil (VIAGRA) 100 MG tablet, TAKE 0.5 TABLETS (50 MG TOTAL) BY MOUTH AT BEDTIME AS NEEDED FOR ERECTILE DYSFUNCTION., Disp: 10 tablet, Rfl: 3 .  spironolactone (ALDACTONE) 25 MG tablet, Take 25 mg by mouth daily., Disp: , Rfl:  .  zolpidem (AMBIEN) 5 MG tablet, Take 1 tablet (5 mg total) by mouth at bedtime as needed for sleep., Disp: 30 tablet, Rfl: 5   Ht Readings from Last 1 Encounters:  04/27/20 5\' 10"  (1.778 m)     Wt Readings from Last 3 Encounters:  04/27/20 270 lb 1 oz (122.5 kg)  04/14/20 (!) 261 lb 1.6 oz (118.4 kg)  03/30/20 258 lb (117 kg)     There is no height or weight on file to calculate BMI.   Social History   Tobacco Use  Smoking Status Never Smoker  Smokeless Tobacco Never Used     Lab Results  Component Value Date   CHOL 151 03/30/2020   Lab Results  Component Value Date   HDL 48 03/30/2020   Lab Results  Component Value Date   LDLCALC 24 03/30/2020   Lab Results  Component Value Date   TRIG 394 (H) 03/30/2020     Lab Results  Component Value Date   HGBA1C 5.5 04/21/2020     CBG (last 3)  No results  for input(s): GLUCAP in the last 72 hours.   Nutrition Note  Spoke with pt. Nutrition Plan and Nutrition Survey goals reviewed with pt.   Pt with dx of CHF. Per discussion, pt does use canned/convenience foods often. Pt does not add salt to food. Pt does eat out frequently. Girlfriend has moved in and pt reports eating out less often now. He does not read labels. He knows how to read a label but doing so reminds him of his heart failure.  He states seeing a mental health therapist would be helpful to come to terms with his diagnosis.  Pt states he wants to lose weight. Recently he has started using his elliptical.  At rehab today he was up 5 lbs. He had been on vacation drinking beers and eating out daily. He reports drinking 6 beers per day. He states he is done after this past week. He forgot to take his lasix while gone as well.   Pt  expressed understanding of the information reviewed.    Nutrition Diagnosis ? Excessive sodium intake related to over consumption of processed food as evidenced by frequent consumption of convenience food/ canned foods and eating out frequently.  Nutrition Intervention ? Pt's individual nutrition plan reviewed with pt. ? Benefits of adopting Heart Healthy diet discussed when Medficts reviewed.   ? Continue client-centered nutrition education by RD, as part of interdisciplinary care.  Goal(s) ? Pt to identify food quantities necessary to achieve weight loss of 6-24 lb at graduation from cardiac rehab.  ? Pt to build a healthy plate including vegetables, fruits, whole grains, and low-fat dairy products in a heart healthy meal plan. ? Pt to eat out less than 2 times per week   Plan:   Will provide client-centered nutrition education as part of interdisciplinary care  Monitor and evaluate progress toward nutrition goal with team.   Andrey Campanile, MS, RDN, LDN

## 2020-05-29 ENCOUNTER — Telehealth: Payer: Self-pay

## 2020-05-29 ENCOUNTER — Other Ambulatory Visit: Payer: Self-pay

## 2020-05-29 ENCOUNTER — Encounter (HOSPITAL_COMMUNITY)
Admission: RE | Admit: 2020-05-29 | Discharge: 2020-05-29 | Disposition: A | Discharge status: Home / Self Care | Payer: 59 | Source: Ambulatory Visit | Attending: Cardiology | Admitting: Cardiology

## 2020-05-29 ENCOUNTER — Ambulatory Visit (HOSPITAL_COMMUNITY): Payer: 59

## 2020-05-29 VITALS — BP 112/72 | HR 84 | Wt 261.5 lb

## 2020-05-29 DIAGNOSIS — I5022 Chronic systolic (congestive) heart failure: Secondary | ICD-10-CM

## 2020-05-29 NOTE — Progress Notes (Signed)
Reviewed home exercise guidelines with patient including endpoints, temperature precautions, target heart rate and rate of perceived exertion. Pt has an elliptical machine at home that he is using 15-20 minutes, 3 days/week as his mode of home exercise. Encouraged patient to try increasing duration to achieve 30 minutes daily, and patient is amenable to this. Patient has weights at home but is not currently using the because of prior shoulder surgery. Patient does not use weights at cardiac rehab either secondary to shoulder issues. Pt voices understanding of instructions given. Artist Pais, MS, ACSM CEP

## 2020-05-29 NOTE — Telephone Encounter (Signed)
Pt is having terrible sharp pain in his lower back right side. While he has been at his cardiac rehab, the nurses there have told him that it is his sciatica and that he needs a shot for it because he is struggling get around. He is requesting that he be able to come into tomorrow and get the injection for pain in his lower back.

## 2020-05-29 NOTE — Telephone Encounter (Signed)
See below

## 2020-05-29 NOTE — Telephone Encounter (Signed)
Needs visit- looks like scheduled on 05/31/20 unless someone else has availability sooner

## 2020-05-30 NOTE — Telephone Encounter (Signed)
Noted  

## 2020-05-30 NOTE — Progress Notes (Signed)
Phone [phone] In person visit   Subjective:   John [name] is a 59 y.o. year old very pleasant male patient who presents for/with See problem oriented charting Chief Complaint  Patient presents with  . Sciatic Nerve Pain   This visit occurred during the SARS-CoV-2 public health emergency.  Safety protocols were in place, including screening questions prior to the visit, additional usage of staff PPE, and extensive cleaning of exam room while observing appropriate contact time as indicated for disinfecting solutions.   Past Medical History-  Patient Active Problem List   Diagnosis Date Noted  . Paroxysmal atrial fibrillation (HCC) 04/14/2020    Priority: High  . S/P ICD (internal cardiac defibrillator) procedure 04/14/2020    Priority: High  . Status post reverse total shoulder replacement, left 11/11/2019    Priority: High  . Non-ischemic cardiomyopathy (HCC)     Priority: High  . Chronic combined systolic and diastolic CHF, NYHA class 3 (HCC) 10/2016    Priority: High  . Alcohol dependence in remission (HCC) 03/19/2016    Priority: High  . Hyperlipidemia 05/27/2018    Priority: Medium  . Essential hypertension 11/08/2016    Priority: Medium  . GI bleed 03/19/2016    Priority: Medium  . ED (erectile dysfunction) 08/21/2015    Priority: Medium  . Hyperglycemia 08/21/2015    Priority: Medium  . Insomnia 08/21/2015    Priority: Medium  . Sleep apnea 08/21/2015    Priority: Medium  . Unspecified fracture of upper end of left humerus, initial encounter for closed fracture 09/03/2017    Priority: Low  . S/P shoulder replacement 11/24/2015    Priority: Low  . Allergic rhinitis 08/21/2015    Priority: Low  . Degenerative joint disease of left hip 03/27/2015    Priority: Low    Medications- reviewed and updated Current Outpatient Medications  Medication Sig Dispense Refill  . carvedilol (COREG) 25 MG tablet Take 1 tablet (25 mg total) by mouth 2 (two) times  daily with a meal. 60 tablet 3  . dapagliflozin propanediol (FARXIGA) 10 MG TABS tablet Take 10 mg by mouth daily.    Marland Kitchen ELIQUIS 5 MG TABS tablet TAKE 1 TABLET BY MOUTH TWICE A DAY 180 tablet 3  . furosemide (LASIX) 40 MG tablet Take 1 tablet (40 mg total) by mouth every morning AND 0.5 tablets (20 mg total) every evening. 135 tablet 3  . icosapent Ethyl (VASCEPA) 1 g capsule Take 2 capsules (2 g total) by mouth 2 (two) times daily. 120 capsule 3  . Multiple Vitamins-Minerals (MULTIVITAMIN WITH MINERALS) tablet Take 1 tablet by mouth daily.    . rosuvastatin (CRESTOR) 20 MG tablet TAKE 1 TABLET BY MOUTH EVERY DAY 90 tablet 3  . sacubitril-valsartan (ENTRESTO) 97-103 MG Take 1 tablet by mouth 2 (two) times daily. 60 tablet 5  . sertraline (ZOLOFT) 100 MG tablet TAKE 1 TABLET BY MOUTH EVERY DAY 90 tablet 3  . sildenafil (VIAGRA) 100 MG tablet TAKE 0.5 TABLETS (50 MG TOTAL) BY MOUTH AT BEDTIME AS NEEDED FOR ERECTILE DYSFUNCTION. 10 tablet 3  . spironolactone (ALDACTONE) 25 MG tablet Take 25 mg by mouth daily.    Marland Kitchen zolpidem (AMBIEN) 10 MG tablet Take 1 tablet (10 mg total) by mouth at bedtime as needed for sleep. 30 tablet 5  . LORazepam (ATIVAN) 1 MG tablet Take 1 mg by mouth 3 (three) times daily as needed.     No current facility-administered medications for this visit.     Objective:  BP 132/72   Pulse 87   Temp 98.3 F (36.8 C) (Temporal)   Resp 18   Ht 5\' 10"  (1.778 m)   Wt 262 lb 9.6 oz (119.1 kg)   SpO2 97%   BMI 37.68 kg/m  Gen: NAD, resting comfortably CV: RRR no murmurs rubs or gallops Lungs: CTAB no crackles, wheeze, rhonchi Ext: no edema Skin: warm, dry Back - Normal skin, Spine with normal alignment and no deformity.  No tenderness to vertebral process palpation.  Paraspinous muscles are tender and with spasm in right low back.  Range of motion is full at neck and lumbar sacral regions for him- some limitation with flexion at baseline. Negative Straight leg raise.  Neuro-  no saddle anesthesia, 5/5 strength lower extremities     Assessment and Plan   # Right low back pain with potential sciatica S:Patient mentioned that he has been having this pain on and off for sometime for 3-4 months. Its mostly to his right lower back- radiates into the groin and into right leg but does not pass the knee (usually anterior). He stated that he has been using heating pad, otc icy hot, tylenol (scheduling regularly under daily max) and he gets in his hot tub. No paresthesias. No incontinence. No weakness in legs other than relted to pain. Pain can be severe at times- can last hours at a time. Walking makes it worse at times. Bending over on cart at store could help.   Back has been a barrier with cardiac rehab.  A/P: 59 year old male with several months of intermittent pain in the right low back radiating into the right leg/groin concerning for nerve root irritation/sciatica.  He has tried conservative measures at home and is failed.  We are going to trial Depo-Medrol 80 mg injection today.  We discussed hopefully him some improvement within 2 days possible improvement can take up to 2 weeks.  If he has not made substantial improvement by 3 weeks I want him to let me know and I will refer to sports medicine or orthopedics.  If he has new or worsening symptoms he should let 41 know immediately  # Insomnia/anxiety S:states 10mg  has been helpful but 5 mg did not help. With 10mg  can sleep 6 hours- feels like doesn't rest at all with 5 mg.   Lorazepam by Dr. [place] PA Lambert- 1 mg once a day for anxiety. Took in place of [place] last night as ran out and only got 2 hours sleep.    Takes lorazepam with anxiety. States once a day or less- spaces 8 hours from [place] and knows not to drive with this. Reviewed PDMP aware and he is picking up monthly #90 states simply because they are calling with refill.   2 beers a day- have advised no beer within 8 hours of ambien or lorazepam as  well A/P: anxiety poorly controlled. Insomnia poorly controlled.  From avs "Increase zoloft to 100mg  once things settle down with back.   Increased ambien but cannot drink alcohol within 4 hours of taking this. Cannot take ativan within 8 hours. My goal is to get you off ativan completely by increasing zoloft. Do not drive for 8 hours after ativan or ambien. If any issues driving we will need to stop both. "   Recommended follow up: keep December follow up or sooner if needed Future Appointments  Date Time Provider Department Center  06/02/2020  7:15 AM [facility] PHASE II EXC [facility] None  06/05/2020  7:15  AM [facility] PHASE II EXC [facility] None  06/07/2020  7:15 AM [facility] PHASE II EXC [facility] None  06/09/2020  7:15 AM [facility] PHASE II EXC [facility] None  06/12/2020  7:15 AM [facility] PHASE II EXC [facility] None  06/14/2020  7:15 AM [facility] PHASE II EXC [facility] None  06/16/2020  7:15 AM [facility] PHASE II EXC [facility] None  06/19/2020  7:15 AM [facility] PHASE II EXC [facility] None  06/19/2020 11:00 AM Laurey Morale, MD [facility] None  06/21/2020  7:15 AM [facility] PHASE II EXC [facility] None  06/23/2020  7:15 AM [facility] PHASE II EXC [facility] None  08/03/2020  7:05 AM [facility] DEVICE REMOTES [facility]  08/22/2020  4:00 PM Shelva Majestic, MD [facility]  11/02/2020  7:05 AM [facility] DEVICE REMOTES [facility]  02/01/2021  7:05 AM [facility] DEVICE REMOTES [facility]  05/03/2021  7:05 AM [facility] DEVICE REMOTES [facility]  08/02/2021  7:05 AM [facility] DEVICE REMOTES [facility]  11/01/2021  7:05 AM [facility] DEVICE REMOTES [facility]  01/31/2022  7:05 AM [facility] DEVICE REMOTES [facility]    Lab/Order associations:   ICD-10-CM   1. Screen for colon cancer  Z12.11 Ambulatory referral to Gastroenterology    Meds ordered this encounter  Medications  . sertraline (ZOLOFT) 100 MG tablet     Sig: TAKE 1 TABLET BY MOUTH EVERY DAY    Dispense:  90 tablet    Refill:  3  . zolpidem (AMBIEN) 10 MG tablet    Sig: Take 1 tablet (10 mg total) by mouth at bedtime as needed for sleep.    Dispense:  30 tablet    Refill:  5   Return precautions advised.  [name], MD

## 2020-05-30 NOTE — Patient Instructions (Addendum)
Health Maintenance Due  Topic Date Due  . COLONOSCOPY We will call you within two weeks about your referral to GI for colonoscopy. If you do not hear within 3 weeks, give [place] a call.   Never done  . INFLUENZA VACCINE- please call back for nurse visit or get this at pharmacy but would wait a few weeks after injection.  04/16/2020   59 year old male with several months of intermittent pain in the right low back radiating into the right leg/groin concerning for nerve root irritation/sciatica.  He has tried conservative measures at home and is failed.  We are going to trial Depo-Medrol 80 mg injection today.  We discussed hopefully him some improvement within 2 days possible improvement can take up to 2 weeks.  If he has not made substantial improvement by 3 weeks I want him to let me know and I will refer to sports medicine or orthopedics.  If he has new or worsening symptoms he should let us know immediately  Increase zoloft to 100mg  once things settle down with back.   Increased ambien but cannot drink alcohol within 4 hours of taking this. Cannot take ativan within 8 hours. My goal is to get you off ativan completely by increasing zoloft. Do not drive for 8 hours after ativan or ambien. If any issues driving we will need to stop both.

## 2020-05-30 NOTE — Telephone Encounter (Signed)
Called pt and he wants to keep appt tmrw 9/15 with Dr Durene Cal

## 2020-05-31 ENCOUNTER — Ambulatory Visit (HOSPITAL_COMMUNITY): Payer: 59

## 2020-05-31 ENCOUNTER — Encounter (HOSPITAL_COMMUNITY): Payer: 59

## 2020-05-31 ENCOUNTER — Encounter: Payer: Self-pay | Admitting: Family Medicine

## 2020-05-31 ENCOUNTER — Ambulatory Visit (INDEPENDENT_AMBULATORY_CARE_PROVIDER_SITE_OTHER): Payer: 59 | Admitting: Family Medicine

## 2020-05-31 ENCOUNTER — Other Ambulatory Visit: Payer: Self-pay

## 2020-05-31 VITALS — BP 132/72 | HR 87 | Temp 98.3°F | Resp 18 | Ht 70.0 in | Wt 262.6 lb

## 2020-05-31 DIAGNOSIS — G8929 Other chronic pain: Secondary | ICD-10-CM | POA: Diagnosis not present

## 2020-05-31 DIAGNOSIS — F5101 Primary insomnia: Secondary | ICD-10-CM

## 2020-05-31 DIAGNOSIS — F411 Generalized anxiety disorder: Secondary | ICD-10-CM | POA: Diagnosis not present

## 2020-05-31 DIAGNOSIS — Z1211 Encounter for screening for malignant neoplasm of colon: Secondary | ICD-10-CM | POA: Diagnosis not present

## 2020-05-31 DIAGNOSIS — M5441 Lumbago with sciatica, right side: Secondary | ICD-10-CM | POA: Diagnosis not present

## 2020-05-31 MED ORDER — METHYLPREDNISOLONE ACETATE 80 MG/ML IJ SUSP
80.0000 mg | Freq: Once | INTRAMUSCULAR | Status: AC
  Administered 2020-05-31: 80 mg via INTRAMUSCULAR

## 2020-05-31 MED ORDER — SERTRALINE HCL 100 MG PO TABS: ORAL_TABLET | ORAL | 3 refills | Status: DC

## 2020-05-31 MED ORDER — ZOLPIDEM TARTRATE 10 MG PO TABS: 10.0000 mg | ORAL_TABLET | Freq: Every evening | ORAL | 5 refills | Status: DC | PRN

## 2020-05-31 NOTE — Addendum Note (Signed)
Addended by: Manuela Schwartz on: 05/31/2020 12:39 PM   Modules accepted: Orders

## 2020-06-02 ENCOUNTER — Telehealth (HOSPITAL_COMMUNITY): Payer: Self-pay | Admitting: Family Medicine

## 2020-06-02 ENCOUNTER — Ambulatory Visit (HOSPITAL_COMMUNITY): Payer: 59

## 2020-06-02 ENCOUNTER — Encounter (HOSPITAL_COMMUNITY): Payer: 59

## 2020-06-05 ENCOUNTER — Telehealth (HOSPITAL_COMMUNITY): Payer: Self-pay | Admitting: *Deleted

## 2020-06-05 ENCOUNTER — Ambulatory Visit (HOSPITAL_COMMUNITY): Payer: 59

## 2020-06-05 ENCOUNTER — Encounter (HOSPITAL_COMMUNITY): Payer: 59

## 2020-06-05 NOTE — Telephone Encounter (Signed)
Patient left message on voicemail. Still having problems with sciatica. Received injection last week and states physician instructed him to give it a few more days rest. Patient hopes to return on Wednesday.  Artist Pais, MS, ACSM CEP

## 2020-06-07 ENCOUNTER — Ambulatory Visit (HOSPITAL_COMMUNITY): Payer: 59

## 2020-06-07 ENCOUNTER — Encounter (HOSPITAL_COMMUNITY): Payer: 59

## 2020-06-07 ENCOUNTER — Telehealth (HOSPITAL_COMMUNITY): Payer: Self-pay

## 2020-06-07 ENCOUNTER — Telehealth (HOSPITAL_COMMUNITY): Payer: Self-pay | Admitting: *Deleted

## 2020-06-07 NOTE — Telephone Encounter (Signed)
Patient left message on department voicemail that he will be out today. Patient states he's still having issues with his hip/sciatic nerve. Spoke with his physician who told him to stay out at least one more day.  Artist Pais, MS, ACSM CEP

## 2020-06-07 NOTE — Telephone Encounter (Signed)
Cardiac Rehab Note:  Successful telephone encounter to John [name] to follow up on extended absence from Cardiac Rehab exercise sessions. Per John [name] he continues to have orthopedic hip pain post injection and was instructed by his provider to "take it easy". John [name] is informed his graduation date from cardiac rehab will be 06/23/20. John [name] states he has a job interview at 8:00 am Friday 06/09/20 but intends to return to CR exercise Monday, 06/12/20. It is agreed that if patient continues to miss exercise sessions past 06/14/20 he will be discharged from the program.   Barney Gertsch E. Suzie Portela RN, BSN [place]  Cardiac and Pulmonary Rehabilitation Phone: [phone] Fax: [phone]

## 2020-06-09 ENCOUNTER — Telehealth: Payer: Self-pay | Admitting: Family Medicine

## 2020-06-09 ENCOUNTER — Ambulatory Visit (HOSPITAL_COMMUNITY): Payer: 59

## 2020-06-09 ENCOUNTER — Other Ambulatory Visit: Payer: Self-pay | Admitting: Family Medicine

## 2020-06-09 ENCOUNTER — Encounter (HOSPITAL_COMMUNITY): Payer: 59

## 2020-06-09 NOTE — Telephone Encounter (Signed)
Pt called about his lorzapem. Pt states the pharmacy did not receive it. Please advise.

## 2020-06-09 NOTE — Telephone Encounter (Signed)
Pt requesting refill

## 2020-06-09 NOTE — Telephone Encounter (Signed)
Ok to refill his Ativan? Please advise.

## 2020-06-12 ENCOUNTER — Ambulatory Visit (HOSPITAL_COMMUNITY): Payer: 59

## 2020-06-12 ENCOUNTER — Encounter (HOSPITAL_COMMUNITY): Payer: Self-pay

## 2020-06-12 ENCOUNTER — Encounter (HOSPITAL_COMMUNITY): Payer: 59

## 2020-06-12 DIAGNOSIS — I5022 Chronic systolic (congestive) heart failure: Secondary | ICD-10-CM

## 2020-06-12 NOTE — Progress Notes (Signed)
Cardiac Individual Treatment Plan  Patient Details  Name: John [name] MRN: [id number] Date of Birth: 1960/12/19 Referring Provider:     CARDIAC REHAB PHASE II ORIENTATION from 04/27/2020 in [facility] CARDIAC REHAB  Referring Provider Marca Ancona, MD      Initial Encounter Date:    CARDIAC REHAB PHASE II ORIENTATION from 04/27/2020 in [facility] CARDIAC REHAB  Date 04/27/20      Visit Diagnosis: Heart failure, chronic systolic (HCC)  Patient's Home Medications on Admission:  Current Outpatient Medications:  .  carvedilol (COREG) 25 MG tablet, Take 1 tablet (25 mg total) by mouth 2 (two) times daily with a meal., Disp: 60 tablet, Rfl: 3 .  dapagliflozin propanediol (FARXIGA) 10 MG TABS tablet, Take 10 mg by mouth daily., Disp: , Rfl:  .  ELIQUIS 5 MG TABS tablet, TAKE 1 TABLET BY MOUTH TWICE A DAY, Disp: 180 tablet, Rfl: 3 .  furosemide (LASIX) 40 MG tablet, Take 1 tablet (40 mg total) by mouth every morning AND 0.5 tablets (20 mg total) every evening., Disp: 135 tablet, Rfl: 3 .  icosapent Ethyl (VASCEPA) 1 g capsule, Take 2 capsules (2 g total) by mouth 2 (two) times daily., Disp: 120 capsule, Rfl: 3 .  LORazepam (ATIVAN) 1 MG tablet, Take 1 mg by mouth 3 (three) times daily as needed., Disp: , Rfl:  .  Multiple Vitamins-Minerals (MULTIVITAMIN WITH MINERALS) tablet, Take 1 tablet by mouth daily., Disp: , Rfl:  .  rosuvastatin (CRESTOR) 20 MG tablet, TAKE 1 TABLET BY MOUTH EVERY DAY, Disp: 90 tablet, Rfl: 3 .  sacubitril-valsartan (ENTRESTO) 97-103 MG, Take 1 tablet by mouth 2 (two) times daily., Disp: 60 tablet, Rfl: 5 .  sertraline (ZOLOFT) 100 MG tablet, TAKE 1 TABLET BY MOUTH EVERY DAY, Disp: 90 tablet, Rfl: 3 .  sildenafil (VIAGRA) 100 MG tablet, TAKE 0.5 TABLETS (50 MG TOTAL) BY MOUTH AT BEDTIME AS NEEDED FOR ERECTILE DYSFUNCTION., Disp: 10 tablet, Rfl: 3 .  spironolactone (ALDACTONE) 25 MG tablet, Take 25 mg by mouth daily., Disp: ,  Rfl:  .  zolpidem (AMBIEN) 10 MG tablet, Take 1 tablet (10 mg total) by mouth at bedtime as needed for sleep., Disp: 30 tablet, Rfl: 5  Past Medical History: Past Medical History:  Diagnosis Date  . Anxiety    Ativan  . Chronic combined systolic and diastolic CHF, NYHA class 3 (HCC) 10/2016   Nonischemic cardiomyopathy. EF 20-25%.  . Chronic left hip pain   . Depression    history of  . Dysrhythmia    A-FIB  . GI bleed 03/2016  . Headache   . Hypertensive heart disease with combined systolic and diastolic congestive heart failure (HCC) 10/2016  . Nonischemic cardiomyopathy (HCC) 10/2016   Echo with EF 20-25%. Cardiac catheterization with no CAD. LVEDP was 41 mmHg, PCWP 36 mmHg  . Osteoarthritis    right shoulder  . Right hand fracture   . Seasonal allergies   . Sleep apnea    wears a CPAP  . Wears glasses     Tobacco Use: Social History   Tobacco Use  Smoking Status Never Smoker  Smokeless Tobacco Never Used    Labs: Recent Review Flowsheet Data    Labs for ITP Cardiac and Pulmonary Rehab Latest Ref Rng & Units 06/09/2018 09/01/2019 09/14/2019 03/30/2020 04/21/2020   Cholestrol 0 - 200 mg/dL 093 - - 818 -   LDLCALC 0 - 99 mg/dL 45 - - 24 -   HDL >  40 mg/dL 53 - - 48 -   Trlycerides <150 mg/dL 66 - - 409(W) -   Hemoglobin A1c <5.7 % of total Hgb - - 5.5 - 5.5   PHART 7.35 - 7.45 - - - - -   PCO2ART 32 - 48 mmHg - - - - -   HCO3 20.0 - 28.0 mmol/L - - - - -   TCO2 22 - 32 mmol/L - 24 - - -   O2SAT % - - - - -      Capillary Blood Glucose: Lab Results  Component Value Date   GLUCAP 113 (H) 11/11/2019   GLUCAP 130 (H) 10/19/2019   GLUCAP 108 (H) 10/19/2019     Exercise Target Goals: Exercise Program Goal: Individual exercise prescription set using results from initial 6 min walk test and THRR while considering  patient's activity barriers and safety.   Exercise Prescription Goal: Initial exercise prescription builds to 30-45 minutes a day of aerobic activity,  2-3 days per week.  Home exercise guidelines will be given to patient during program as part of exercise prescription that the participant will acknowledge.  Activity Barriers & Risk Stratification:  Activity Barriers & Cardiac Risk Stratification - 04/27/20 1546      Activity Barriers & Cardiac Risk Stratification   Activity Barriers Other (comment);Left Hip Replacement;Joint Problems;Deconditioning    Cardiac Risk Stratification High           6 Minute Walk:  6 Minute Walk    Row Name 04/27/20 1543         6 Minute Walk   Phase Initial     Distance 1507 feet     Walk Time 6 minutes     # of Rest Breaks 0     MPH 2.9     METS 3.1     RPE 11     Perceived Dyspnea  1     VO2 Peak 10.8     Symptoms Yes (comment)     Comments Pt reported SOB +1, bilateral hip pain 6/10     Resting HR 71 bpm     Resting BP 122/78     Resting Oxygen Saturation  97 %     Exercise Oxygen Saturation  during 6 min walk 96 %     Max Ex. HR 115 bpm     Max Ex. BP 150/82     2 Minute Post BP 108/62            Oxygen Initial Assessment:   Oxygen Re-Evaluation:   Oxygen Discharge (Final Oxygen Re-Evaluation):   Initial Exercise Prescription:  Initial Exercise Prescription - 04/27/20 1500      Date of Initial Exercise RX and Referring Provider   Date 04/27/20    Referring Provider Marca Ancona, MD    Expected Discharge Date 06/23/20      NuStep   Level 2    SPM 75    Minutes 15    METs 2.5      Track   Laps 12    Minutes 15    METs 2.39      Prescription Details   Frequency (times per week) 3x    Duration Progress to 10 minutes continuous walking  at current work load and total walking time to 30-45 min      Intensity   THRR 40-80% of Max Heartrate 65-130    Ratings of Perceived Exertion 11-13    Perceived Dyspnea 0-4  Progression   Progression Continue progressive overload as per policy without signs/symptoms or physical distress.      Resistance Training    Training Prescription Yes    Weight 3lbs    Reps 10-15           Perform Capillary Blood Glucose checks as needed.  Exercise Prescription Changes:   Exercise Prescription Changes    Row Name 05/01/20 0726 05/17/20 0709 05/29/20 0710         Response to Exercise   Blood Pressure (Admit) 106/74 140/60 112/72     Blood Pressure (Exercise) 124/78 124/62 100/58     Blood Pressure (Exit) 116/72 128/70 110/64     Heart Rate (Admit) 81 bpm 84 bpm 84 bpm     Heart Rate (Exercise) 101 bpm 113 bpm 107 bpm     Heart Rate (Exit) 82 bpm 84 bpm 91 bpm     Rating of Perceived Exertion (Exercise) 12 12 12      Symptoms Low back pain -- --     Comments Patient tolerated exericse fair with some low back pain with walking. -- --     Duration Progress to 30 minutes of  aerobic without signs/symptoms of physical distress Progress to 30 minutes of  aerobic without signs/symptoms of physical distress Progress to 30 minutes of  aerobic without signs/symptoms of physical distress     Intensity THRR unchanged THRR unchanged THRR unchanged       Progression   Progression Continue to progress workloads to maintain intensity without signs/symptoms of physical distress. Continue to progress workloads to maintain intensity without signs/symptoms of physical distress. Continue to progress workloads to maintain intensity without signs/symptoms of physical distress.     Average METs 2.4 2.6 2.7       Resistance Training   Training Prescription No No No  Doesn't do weights because of shoulder surgery.       Interval Training   Interval Training No No No       NuStep   Level 2 2 3      SPM 75 75 75     Minutes 15 15 15      METs 1.9 2.4 2.3       Track   Laps 16 16 7      Minutes 10 10 6   Stopped early due to hip pain.     METs 2.86 2.86 3.03       Home Exercise Plan   Plans to continue exercise at -- -- Home (comment)  Elliptical machine at home.     Frequency -- -- Add 3 additional days to program  exercise sessions.     Initial Home Exercises Provided -- -- 05/29/20            Exercise Comments:   Exercise Comments    Row Name 05/01/20 0808 05/10/20 0750 05/29/20 0725 06/12/20 0957     Exercise Comments Patient tolerated low intensity fair c/o low back pain after second station. Reviewed METs with patient. Reviewed home exercise guidelines and goals with patient. Patient has been out since 05/29/20 with sciatic nerve issues/hip pain.           Exercise Goals and Review:   Exercise Goals    Row Name 04/27/20 1456             Exercise Goals   Increase Physical Activity Yes       Intervention Provide advice, education, support and counseling about physical activity/exercise needs.;Develop an individualized exercise prescription  for aerobic and resistive training based on initial evaluation findings, risk stratification, comorbidities and participant's personal goals.       Expected Outcomes Short Term: Attend rehab on a regular basis to increase amount of physical activity.;Long Term: Exercising regularly at least 3-5 days a week.;Long Term: Add in home exercise to make exercise part of routine and to increase amount of physical activity.       Increase Strength and Stamina Yes       Intervention Provide advice, education, support and counseling about physical activity/exercise needs.;Develop an individualized exercise prescription for aerobic and resistive training based on initial evaluation findings, risk stratification, comorbidities and participant's personal goals.       Expected Outcomes Short Term: Increase workloads from initial exercise prescription for resistance, speed, and METs.;Short Term: Perform resistance training exercises routinely during rehab and add in resistance training at home;Long Term: Improve cardiorespiratory fitness, muscular endurance and strength as measured by increased METs and functional capacity ( )       Able to understand and use rate of  perceived exertion (RPE) scale Yes       Intervention Provide education and explanation on how to use RPE scale       Expected Outcomes Short Term: Able to use RPE daily in rehab to express subjective intensity level;Long Term:  Able to use RPE to guide intensity level when exercising independently       Knowledge and understanding of Target Heart Rate Range (THRR) Yes       Intervention Provide education and explanation of THRR including how the numbers were predicted and where they are located for reference       Expected Outcomes Short Term: Able to state/look up THRR;Long Term: Able to use THRR to govern intensity when exercising independently;Short Term: Able to use daily as guideline for intensity in rehab       Able to check pulse independently Yes       Intervention Provide education and demonstration on how to check pulse in carotid and radial arteries.;Review the importance of being able to check your own pulse for safety during independent exercise       Expected Outcomes Short Term: Able to explain why pulse checking is important during independent exercise;Long Term: Able to check pulse independently and accurately       Understanding of Exercise Prescription Yes       Intervention Provide education, explanation, and written materials on patient's individual exercise prescription       Expected Outcomes Short Term: Able to explain program exercise prescription;Long Term: Able to explain home exercise prescription to exercise independently              Exercise Goals Re-Evaluation :  Exercise Goals Re-Evaluation    Row Name 05/01/20 0808 05/24/20 0700 05/29/20 0725 06/14/20 0945       Exercise Goal Re-Evaluation   Exercise Goals Review Increase Physical Activity;Able to understand and use rate of perceived exertion (RPE) scale -- Increase Physical Activity;Able to understand and use rate of perceived exertion (RPE) scale;Understanding of Exercise Prescription;Knowledge and  understanding of Target Heart Rate Range (THRR);Increase Strength and Stamina --    Comments Patient able to understand and use RPE scale appropriately. Patient called out today due to family matter. Unable to review METs and goals. Reviewed home exercise guidelines with patient including endpoints, temperature precautions, target heart rate and rate of perceived exertion. Pt has an elliptical machine at home that he is using 15-20 minutes, 3  days/week as his mode of home exercise. Encouraged patient to try increasing duration to achieve 30 minutes daily, and patient is amenable to this. Patient has weights at home but is not currently using the because of prior shoulder surgery. Patient does not use weights at cardiac rehab either secondary to shoulder issues. Pt voices understanding of instructions given. Patient unable to complete the phase 2 cardiac rehab program due to chronic hip/back pain. Per prior discussion with the nurse case manager, patient will be discharged from the program at this time.    Expected Outcomes Increase workloads as tolerated to help improve cardiorespiratory fitness. -- Patient will increase his exercise duration at home to 30 minutes, 3 days/week to help improve cardiorespiratory fitness. --           Discharge Exercise Prescription (Final Exercise Prescription Changes):  Exercise Prescription Changes - 05/29/20 0710      Response to Exercise   Blood Pressure (Admit) 112/72    Blood Pressure (Exercise) 100/58    Blood Pressure (Exit) 110/64    Heart Rate (Admit) 84 bpm    Heart Rate (Exercise) 107 bpm    Heart Rate (Exit) 91 bpm    Rating of Perceived Exertion (Exercise) 12    Duration Progress to 30 minutes of  aerobic without signs/symptoms of physical distress    Intensity THRR unchanged      Progression   Progression Continue to progress workloads to maintain intensity without signs/symptoms of physical distress.    Average METs 2.7      Resistance  Training   Training Prescription No   Doesn't do weights because of shoulder surgery.     Interval Training   Interval Training No      NuStep   Level 3    SPM 75    Minutes 15    METs 2.3      Track   Laps 7    Minutes 6   Stopped early due to hip pain.   METs 3.03      Home Exercise Plan   Plans to continue exercise at Home (comment)   Elliptical machine at home.   Frequency Add 3 additional days to program exercise sessions.    Initial Home Exercises Provided 05/29/20           Nutrition:  Target Goals: Understanding of nutrition guidelines, daily intake of sodium 1500mg , cholesterol 200mg , calories 30% from fat and 7% or less from saturated fats, daily to have 5 or more servings of fruits and vegetables.  Biometrics:  Pre Biometrics - 04/27/20 1545      Pre Biometrics   Height 5\' 10"  (1.778 m)    Weight 270 lb 1 oz (122.5 kg)    Waist Circumference 54 inches    Hip Circumference 51 inches    Waist to Hip Ratio 1.06 %    BMI (Calculated) 38.75    Triceps Skinfold 35 mm    % Body Fat 41.8 %    Grip Strength 34 kg    Flexibility 0 in    Single Leg Stand 0 seconds            Nutrition Therapy Plan and Nutrition Goals:  Nutrition Therapy & Goals - 05/26/20 0831      Nutrition Therapy   Diet heart healthy      Personal Nutrition Goals   Nutrition Goal Pt to identify food quantities necessary to achieve weight loss of 6-24 lb at graduation from cardiac rehab.  Personal Goal #2 Pt to build a healthy plate including vegetables, fruits, whole grains, and low-fat dairy products in a heart healthy meal plan.    Personal Goal #3 Pt to eat out less than 2 times per week      Intervention Plan   Intervention Prescribe, educate and counsel regarding individualized specific dietary modifications aiming towards targeted core components such as weight, hypertension, lipid management, diabetes, heart failure and other comorbidities.    Expected Outcomes Short Term  Goal: Understand basic principles of dietary content, such as calories, fat, sodium, cholesterol and nutrients.           Nutrition Assessments:   Nutrition Goals Re-Evaluation:  Nutrition Goals Re-Evaluation    Row Name 05/26/20 0831             Goals   Current Weight 270 lb (122.5 kg)              Nutrition Goals Re-Evaluation:  Nutrition Goals Re-Evaluation    Row Name 05/26/20 0831             Goals   Current Weight 270 lb (122.5 kg)              Nutrition Goals Discharge (Final Nutrition Goals Re-Evaluation):  Nutrition Goals Re-Evaluation - 05/26/20 0831      Goals   Current Weight 270 lb (122.5 kg)           Psychosocial: Target Goals: Acknowledge presence or absence of significant depression and/or stress, maximize coping skills, provide positive support system. Participant is able to verbalize types and ability to use techniques and skills needed for reducing stress and depression.  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  Feels depressed by his present health concearns      Family Dynamics   Good Support System? Yes    Comments Pt has children and girlfriend who are supportive of his efforts to increase his activity and make nutritional changes in his diet.      Barriers   Psychosocial barriers to participate in program The patient should benefit from training in stress management and relaxation.      Screening Interventions   Interventions Encouraged to exercise           Quality of Life Scores:  Quality of Life - 04/27/20 1600      Quality of Life   Select Quality of Life      Quality of Life Scores    Health/Function Pre 16.77 %    Socioeconomic Pre 24.25 %    Psych/Spiritual Pre 21.43 %    Family Pre 25.2 %    GLOBAL Pre 20.61 %          Scores of 19 and below usually indicate a poorer quality of life in these areas.  A difference of  2-3 points is a clinically meaningful difference.  A difference of 2-3 points in the total score of the Quality of Life Index has been associated with significant improvement in overall quality of life, self-image, physical symptoms, and general health in studies assessing change in quality of life.  PHQ-9: Recent Review Flowsheet Data    Depression screen [facility] 2/9  04/27/2020 01/20/2020 12/06/2019   Decreased Interest 0 0 0   Down, Depressed, Hopeless 0 0 0   PHQ - 2 Score 0 0 0   Altered sleeping 0 - -   Tired, decreased energy 1 - -   Change in appetite 0 - -   Feeling bad or failure about yourself  1 - -   Trouble concentrating 0 - -   Moving slowly or fidgety/restless 0 - -   Suicidal thoughts 0 - -   PHQ-9 Score 2 - -   Difficult doing work/chores Not difficult at all - -     Interpretation of Total Score  Total Score Depression Severity:  1-4 = Minimal depression, 5-9 = Mild depression, 10-14 = Moderate depression, 15-19 = Moderately severe depression, 20-27 = Severe depression   Psychosocial Evaluation and Intervention:  Psychosocial Evaluation - 05/01/20 0910      Psychosocial Evaluation & Interventions   Interventions Encouraged to exercise with the program and follow exercise prescription;Stress management education;Relaxation education    Comments Patient presents to cardiac rehab with a positive attitude and states he is "glad to be here". Mr. Mealey admits to a history of depression and stress related to his current health status and occupation. 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 He has a live in girlfriend that provides patient with emotional support.    Expected Outcomes Pt will exhibit a  positive outlook with good coping skills.    Continue Psychosocial Services  Follow up required by staff           Psychosocial Re-Evaluation:  Psychosocial Re-Evaluation    Row Name 05/23/20 1537 06/12/20 1604           Psychosocial Re-Evaluation   Current issues with History of Depression;Current Stress Concerns --      Comments Mr. Lant admits to stress related to his inability to do things as he used to prior to decrease in heart function and shoulder surgeries. He has a very supportive family including children and girlfriend. He hopes the socialization and motiviation he recieves in cardiac rehab will help his mental health and outlook on life. He denies psychosocial needs at this time. Unable to reassess secondary to lack of attendance      Expected Outcomes Pt will exhibit a postive outlook with good coping skills. Pt will exhibit a postive outlook with good coping skills.      Interventions Encouraged to attend Cardiac Rehabilitation for the exercise Encouraged to attend Cardiac Rehabilitation for the exercise      Continue Psychosocial Services  Follow up required by staff Follow up required by staff             Psychosocial Discharge (Final Psychosocial Re-Evaluation):  Psychosocial Re-Evaluation - 06/12/20 1604      Psychosocial Re-Evaluation   Comments Unable to reassess secondary to lack of attendance    Expected Outcomes Pt will exhibit a postive outlook with good coping skills.    Interventions Encouraged to attend Cardiac Rehabilitation for the exercise    Continue Psychosocial Services  Follow up required by staff           Vocational Rehabilitation: Provide vocational rehab assistance to qualifying candidates.   Vocational Rehab Evaluation & Intervention:  Vocational Rehab - 04/27/20 1724      Initial Vocational Rehab Evaluation & Intervention   Assessment shows need for Vocational Rehabilitation No  Education: Education Goals:  Education classes will be provided on a weekly basis, covering required topics. Participant will state understanding/return demonstration of topics presented.  Learning Barriers/Preferences:  Learning Barriers/Preferences - 04/27/20 1603      Learning Barriers/Preferences   Learning Barriers Sight    Learning Preferences Skilled Demonstration;Verbal Instruction;Video           Education Topics: Count Your Pulse:  -Group instruction provided by verbal instruction, demonstration, patient participation and written materials to support subject.  Instructors address importance of being able to find your pulse and how to count your pulse when at home without a heart monitor.  Patients get hands on experience counting their pulse with staff help and individually.   Heart Attack, Angina, and Risk Factor Modification:  -Group instruction provided by verbal instruction, video, and written materials to support subject.  Instructors address signs and symptoms of angina and heart attacks.    Also discuss risk factors for heart disease and how to make changes to improve heart health risk factors.   Functional Fitness:  -Group instruction provided by verbal instruction, demonstration, patient participation, and written materials to support subject.  Instructors address safety measures for doing things around the house.  Discuss how to get up and down off the floor, how to pick things up properly, how to safely get out of a chair without assistance, and balance training.   Meditation and Mindfulness:  -Group instruction provided by verbal instruction, patient participation, and written materials to support subject.  Instructor addresses importance of mindfulness and meditation practice to help reduce stress and improve awareness.  Instructor also leads participants through a meditation exercise.    Stretching for Flexibility and Mobility:  -Group instruction provided by verbal instruction, patient  participation, and written materials to support subject.  Instructors lead participants through series of stretches that are designed to increase flexibility thus improving mobility.  These stretches are additional exercise for major muscle groups that are typically performed during regular warm up and cool down.   Hands Only CPR:  -Group verbal, video, and participation provides a basic overview of AHA guidelines for community CPR. Role-play of emergencies allow participants the opportunity to practice calling for help and chest compression technique with discussion of AED use.   Hypertension: -Group verbal and written instruction that provides a basic overview of hypertension including the most recent diagnostic guidelines, risk factor reduction with self-care instructions and medication management.    Nutrition I class: Heart Healthy Eating:  -Group instruction provided by PowerPoint slides, verbal discussion, and written materials to support subject matter. The instructor gives an explanation and review of the Therapeutic Lifestyle Changes diet recommendations, which includes a discussion on lipid goals, dietary fat, sodium, fiber, plant stanol/sterol esters, sugar, and the components of a well-balanced, healthy diet.   Nutrition II class: Lifestyle Skills:  -Group instruction provided by PowerPoint slides, verbal discussion, and written materials to support subject matter. The instructor gives an explanation and review of label reading, grocery shopping for heart health, heart healthy recipe modifications, and ways to make healthier choices when eating out.   Diabetes Question & Answer:  -Group instruction provided by PowerPoint slides, verbal discussion, and written materials to support subject matter. The instructor gives an explanation and review of diabetes co-morbidities, pre- and post-prandial blood glucose goals, pre-exercise blood glucose goals, signs, symptoms, and treatment of  hypoglycemia and hyperglycemia, and foot care basics.   Diabetes Blitz:  -Group instruction provided by [employer], verbal discussion, and written  materials to support subject matter. The instructor gives an explanation and review of the physiology behind type 1 and type 2 diabetes, diabetes medications and rational behind using different medications, pre- and post-prandial blood glucose recommendations and Hemoglobin A1c goals, diabetes diet, and exercise including blood glucose guidelines for exercising safely.    Portion Distortion:  -Group instruction provided by PowerPoint slides, verbal discussion, written materials, and food models to support subject matter. The instructor gives an explanation of serving size versus portion size, changes in portions sizes over the last 20 years, and what consists of a serving from each food group.   Stress Management:  -Group instruction provided by verbal instruction, video, and written materials to support subject matter.  Instructors review role of stress in heart disease and how to cope with stress positively.     Exercising on Your Own:  -Group instruction provided by verbal instruction, power point, and written materials to support subject.  Instructors discuss benefits of exercise, components of exercise, frequency and intensity of exercise, and end points for exercise.  Also discuss use of nitroglycerin and activating EMS.  Review options of places to exercise outside of rehab.  Review guidelines for sex with heart disease.   Cardiac Drugs I:  -Group instruction provided by verbal instruction and written materials to support subject.  Instructor reviews cardiac drug classes: antiplatelets, anticoagulants, beta blockers, and statins.  Instructor discusses reasons, side effects, and lifestyle considerations for each drug class.   Cardiac Drugs II:  -Group instruction provided by verbal instruction and written materials to support subject.   Instructor reviews cardiac drug classes: angiotensin converting enzyme inhibitors (ACE-I), angiotensin II receptor blockers (ARBs), nitrates, and calcium channel blockers.  Instructor discusses reasons, side effects, and lifestyle considerations for each drug class.   Anatomy and Physiology of the Circulatory System:  Group verbal and written instruction and models provide basic cardiac anatomy and physiology, with the coronary electrical and arterial systems. Review of: AMI, Angina, Valve disease, Heart Failure, Peripheral Artery Disease, Cardiac Arrhythmia, Pacemakers, and the ICD.   Other Education:  -Group or individual verbal, written, or video instructions that support the educational goals of the cardiac rehab program.   Holiday Eating Survival Tips:  -Group instruction provided by PowerPoint slides, verbal discussion, and written materials to support subject matter. The instructor gives patients tips, tricks, and techniques to help them not only survive but enjoy the holidays despite the onslaught of food that accompanies the holidays.   Knowledge Questionnaire Score:  Knowledge Questionnaire Score - 04/27/20 1600      Knowledge Questionnaire Score   Pre Score 19/24           Core Components/Risk Factors/Patient Goals at Admission:  Personal Goals and Risk Factors at Admission - 04/27/20 1724      Core Components/Risk Factors/Patient Goals on Admission    Weight Management Yes;Obesity    Stress Yes    Intervention Offer individual and/or small group education and counseling on adjustment to heart disease, stress management and health-related lifestyle change. Teach and support self-help strategies.;Refer participants experiencing significant psychosocial distress to appropriate mental health specialists for further evaluation and treatment. When possible, include family members and significant others in education/counseling sessions.    Expected Outcomes Short Term:  Participant demonstrates changes in health-related behavior, relaxation and other stress management skills, ability to obtain effective social support, and compliance with psychotropic medications if prescribed.;Long Term: Emotional wellbeing is indicated by absence of clinically significant psychosocial distress or social isolation.  Core Components/Risk Factors/Patient Goals Review:   Goals and Risk Factor Review    Row Name 05/01/20 0913 05/23/20 1540 06/12/20 1604         Core Components/Risk Factors/Patient Goals Review   Personal Goals Review Weight Management/Obesity;Hypertension;Lipids;Heart Failure;Stress Weight Management/Obesity;Hypertension;Lipids;Heart Failure;Stress Weight Management/Obesity;Hypertension;Lipids;Heart Failure;Stress     Review Mr. Vercher has multiple CAD risk factors. States he is glad to be enrolled in CR for exercise and risk factor modifications. Goals are to increase stamina and strength and improve heart function. Working towards a weight loss goal to loose approximately 40 lbs. Mr. Bibby has multiple CAD risk factors. States he is glad to be enrolled in CR for exercise and risk factor modifications. Goals are to increase stamina and strength and improve heart function. Working towards a weight loss goal to loose approximately 40 lbs. Mr. Varnell has multiple CAD risk factors. Unfortunately Mr. Doolan is not on track to meet his goals of increasing his stamina and strength and improve his heart function secondary to his lack of attendance. As of 05/29/20 he had lost 3.9 kg of weight, presumably fluid weight as he had stopped taking his diuretic then resumed.     Expected Outcomes Pt will continue to participate in CR exercise, nutrition, and lifestyle modification opportunities once exercise resumes Pt will continue to participate in CR exercise, nutrition, and lifestyle modification opportunities once exercise resumes Pt will continue to participate  in CR exercise, nutrition, and lifestyle modification opportunities once exercise resumes            Core Components/Risk Factors/Patient Goals at Discharge (Final Review):   Goals and Risk Factor Review - 06/12/20 1604      Core Components/Risk Factors/Patient Goals Review   Personal Goals Review Weight Management/Obesity;Hypertension;Lipids;Heart Failure;Stress    Review Mr. Mcdermid has multiple CAD risk factors. Unfortunately Mr. Eggebrecht is not on track to meet his goals of increasing his stamina and strength and improve his heart function secondary to his lack of attendance. As of 05/29/20 he had lost 3.9 kg of weight, presumably fluid weight as he had stopped taking his diuretic then resumed.    Expected Outcomes Pt will continue to participate in CR exercise, nutrition, and lifestyle modification opportunities once exercise resumes           ITP Comments:  ITP Comments    Row Name 04/27/20 1455 05/01/20 0906 05/23/20 1532 06/12/20 1556     ITP Comments Dr. Armanda Magic, Medical Director Mr. Loconte completed his first cardiac rehab exercise session today and tolerated well. He did admit to having lower back pain 8/10 while walking the track. He did not utilize weights or stretches post aerobic exercise. VSS. CR goals are to increase stamina and strength and to improve heart function. He would also like to begin a weight loss program to reduce his weight to 220 lbs. Current weight 259 lb. 30 day ITP review: Mr. Carillo has completed 5 cardiac rehab exercise sessions since admission and tolerated well. C/O right hip pain with ambulation that subsides with rest. Average mets 2.2-2.7. Dmitry hopes to increase his stamina and strength and improve his heart function through exercise. Will re-evaluate progression towares goals over the next 30 days. Mr. Rodwell states he is enjoying cardiac rehab and the motivation provided by staff. 30 day ITP review: Mr. Nuon has attended only 7  cardiac rehab exercise sessions since admission. He is scheduled to graduate from the 8 week program 06/23/20. Unfortunately Mr. Campbells attendance has been very poor  secondary to covid like symptoms, back pain, and most recently job interview. During most recent telephone encounter with Mr. Innocent, it is agreed that if he does not present to exercise 9/29 he will be discharged from the program. Will continue to follow during next encounter.           Comments: see ITP comment

## 2020-06-12 NOTE — Telephone Encounter (Signed)
Was prescribed by another office- brittany sent a message about this but we may want to talk with patient by phone about this

## 2020-06-12 NOTE — Telephone Encounter (Signed)
Ativan was from another location "Lorazepam by Dr. Loralie Champagne PA [place]- " as of my last note

## 2020-06-12 NOTE — Telephone Encounter (Signed)
Patient has been notified to contact Dr. Loralie Champagne office in regards to ativan refill.

## 2020-06-13 ENCOUNTER — Telehealth: Payer: Self-pay

## 2020-06-13 NOTE — Telephone Encounter (Signed)
I am willing to refill but I need patient to be counseled.  After he has been counseled on the below please give me an update and I will send in medication  Obviously he needs to avoid alcohol completely if he is going to be taking Ativan in the daytime and Ambien before bed-I need him to commit to this and we will need to do a urine drug screen at follow-up.  I just think complex directions that I previously mentioned would be cumbersome  -No alcohol 4 hours prior to Ambien -No Ativan within 8 hours of Ambien -No alcohol 4 hours before Ativan or 8 hours after alcohol  These are high risk medications and particularly if used around alcohol.  Another option would be psychiatry referral to see if they can help [place] with management of anxiety, insomnia  Also my understanding is that he is taking Ativan just once a day so I will fill as Ativan 1 mg once daily number 30 tablets to be refilled monthly

## 2020-06-13 NOTE — Telephone Encounter (Signed)
I see prior phone notes, will pt need OV with you to discuss since the prescribing provider is retired?

## 2020-06-13 NOTE — Telephone Encounter (Signed)
  LAST APPOINTMENT DATE: 06/09/2020   NEXT APPOINTMENT DATE:@12 /03/2020  MEDICATION: LORazepam (ATIVAN) 1 MG tablet  PHARMACY: [id number] [name], [place] - [address]  Comments: Patient states the medication was prescribed by a doctor who is now retired and is really needing this prescription.

## 2020-06-14 ENCOUNTER — Encounter (HOSPITAL_COMMUNITY): Payer: 59

## 2020-06-14 ENCOUNTER — Ambulatory Visit (HOSPITAL_COMMUNITY): Payer: 59

## 2020-06-14 NOTE — Telephone Encounter (Signed)
Called and spoke with pt and reviewed below details with him. Pt made aware and agrees.

## 2020-06-14 NOTE — Telephone Encounter (Signed)
Called and lm for pt tcb. 

## 2020-06-15 ENCOUNTER — Encounter (HOSPITAL_COMMUNITY): Payer: Self-pay

## 2020-06-15 MED ORDER — LORAZEPAM 1 MG PO TABS: 1.0000 mg | ORAL_TABLET | Freq: Every day | ORAL | 5 refills | Status: DC | PRN

## 2020-06-15 NOTE — Addendum Note (Signed)
Addended by: Shelva Majestic on: 06/15/2020 08:06 AM   Modules accepted: Orders

## 2020-06-15 NOTE — Telephone Encounter (Signed)
I sent this in for him. Thanks for counseling him

## 2020-06-15 NOTE — Progress Notes (Signed)
Discharge Progress Report  Patient Details  Name: John [name] MRN: [id number] Date of Birth: 1960-12-11 Referring Provider:     CARDIAC REHAB PHASE II ORIENTATION from 04/27/2020 in [facility]  Referring Provider John Ancona, MD       Number of Visits: 7  Reason for Discharge:  Early Exit:  Lack of attendance  Smoking History:  Social History   Tobacco Use  Smoking Status Never Smoker  Smokeless Tobacco Never Used    Diagnosis:  No diagnosis found.  ADL UCSD:   Initial Exercise Prescription:  Initial Exercise Prescription - 04/27/20 1500      Date of Initial Exercise RX and Referring Provider   Date 04/27/20    Referring Provider John Ancona, MD    Expected Discharge Date 06/23/20      NuStep   Level 2    SPM 75    Minutes 15    METs 2.5      Track   Laps 12    Minutes 15    METs 2.39      Prescription Details   Frequency (times per week) 3x    Duration Progress to 10 minutes continuous walking  at current work load and total walking time to 30-45 min      Intensity   THRR 40-80% of Max Heartrate 65-130    Ratings of Perceived Exertion 11-13    Perceived Dyspnea 0-4      Progression   Progression Continue progressive overload as per policy without signs/symptoms or physical distress.      Resistance Training   Training Prescription Yes    Weight 3lbs    Reps 10-15           Discharge Exercise Prescription (Final Exercise Prescription Changes):  Exercise Prescription Changes - 05/29/20 0710      Response to Exercise   Blood Pressure (Admit) 112/72    Blood Pressure (Exercise) 100/58    Blood Pressure (Exit) 110/64    Heart Rate (Admit) 84 bpm    Heart Rate (Exercise) 107 bpm    Heart Rate (Exit) 91 bpm    Rating of Perceived Exertion (Exercise) 12    Duration Progress to 30 minutes of  aerobic without signs/symptoms of physical distress    Intensity THRR unchanged      Progression    Progression Continue to progress workloads to maintain intensity without signs/symptoms of physical distress.    Average METs 2.7      Resistance Training   Training Prescription No   Doesn't do weights because of shoulder surgery.     Interval Training   Interval Training No      NuStep   Level 3    SPM 75    Minutes 15    METs 2.3      Track   Laps 7    Minutes 6   Stopped early due to hip pain.   METs 3.03      Home Exercise Plan   Plans to continue exercise at Home (comment)   Elliptical machine at home.   Frequency Add 3 additional days to program exercise sessions.    Initial Home Exercises Provided 05/29/20           Functional Capacity:  6 Minute Walk    Row Name 04/27/20 1543         6 Minute Walk   Phase Initial     Distance 1507 feet     Walk  Time 6 minutes     # of Rest Breaks 0     MPH 2.9     METS 3.1     RPE 11     Perceived Dyspnea  1     VO2 Peak 10.8     Symptoms Yes (comment)     Comments Pt reported SOB +1, bilateral hip pain 6/10     Resting HR 71 bpm     Resting BP 122/78     Resting Oxygen Saturation  97 %     Exercise Oxygen Saturation  during 6 min walk 96 %     Max Ex. HR 115 bpm     Max Ex. BP 150/82     2 Minute Post BP 108/62            Psychological, QOL, Others - Outcomes: PHQ 2/9: Depression screen [facility] 2/9 04/27/2020 01/20/2020 12/06/2019  Decreased Interest 0 0 0  Down, Depressed, Hopeless 0 0 0  PHQ - 2 Score 0 0 0  Altered sleeping 0 - -  Tired, decreased energy 1 - -  Change in appetite 0 - -  Feeling bad or failure about yourself  1 - -  Trouble concentrating 0 - -  Moving slowly or fidgety/restless 0 - -  Suicidal thoughts 0 - -  PHQ-9 Score 2 - -  Difficult doing work/chores Not difficult at all - -  Some recent data might be hidden    Quality of Life:  Quality of Life - 04/27/20 1600      Quality of Life   Select Quality of Life      Quality of Life Scores   Health/Function Pre 16.77 %     Socioeconomic Pre 24.25 %    Psych/Spiritual Pre 21.43 %    Family Pre 25.2 %    GLOBAL Pre 20.61 %           Personal Goals: Goals established at orientation with interventions provided to work toward goal.  Personal Goals and Risk Factors at Admission - 04/27/20 1724      Core Components/Risk Factors/Patient Goals on Admission    Weight Management Yes;Obesity    Stress Yes    Intervention Offer individual and/or small group education and counseling on adjustment to heart disease, stress management and health-related lifestyle change. Teach and support self-help strategies.;Refer participants experiencing significant psychosocial distress to appropriate mental health specialists for further evaluation and treatment. When possible, include family members and significant others in education/counseling sessions.    Expected Outcomes Short Term: Participant demonstrates changes in health-related behavior, relaxation and other stress management skills, ability to obtain effective social support, and compliance with psychotropic medications if prescribed.;Long Term: Emotional wellbeing is indicated by absence of clinically significant psychosocial distress or social isolation.            Personal Goals Discharge:  Goals and Risk Factor Review    Row Name 05/01/20 0913 05/23/20 1540 06/12/20 1604         Core Components/Risk Factors/Patient Goals Review   Personal Goals Review Weight Management/Obesity;Hypertension;Lipids;Heart Failure;Stress Weight Management/Obesity;Hypertension;Lipids;Heart Failure;Stress Weight Management/Obesity;Hypertension;Lipids;Heart Failure;Stress     Review John [name] has multiple CAD risk factors. States he is glad to be enrolled in CR for exercise and risk factor modifications. Goals are to increase stamina and strength and improve heart function. Working towards a weight loss goal to loose approximately 40 lbs. John [name] has multiple CAD risk factors.  States he is glad to be enrolled  in CR for exercise and risk factor modifications. Goals are to increase stamina and strength and improve heart function. Working towards a weight loss goal to loose approximately 40 lbs. John [name] has multiple CAD risk factors. Unfortunately Mr. Suit is not on track to meet his goals of increasing his stamina and strength and improve his heart function secondary to his lack of attendance. As of 05/29/20 he had lost 3.9 kg of weight, presumably fluid weight as he had stopped taking his diuretic then resumed.     Expected Outcomes Pt will continue to participate in CR exercise, nutrition, and lifestyle modification opportunities once exercise resumes Pt will continue to participate in CR exercise, nutrition, and lifestyle modification opportunities once exercise resumes Pt will continue to participate in CR exercise, nutrition, and lifestyle modification opportunities once exercise resumes            Exercise Goals and Review:  Exercise Goals    Row Name 04/27/20 1456             Exercise Goals   Increase Physical Activity Yes       Intervention Provide advice, education, support and counseling about physical activity/exercise needs.;Develop an individualized exercise prescription for aerobic and resistive training based on initial evaluation findings, risk stratification, comorbidities and participant's personal goals.       Expected Outcomes Short Term: Attend rehab on a regular basis to increase amount of physical activity.;Long Term: Exercising regularly at least 3-5 days a week.;Long Term: Add in home exercise to make exercise part of routine and to increase amount of physical activity.       Increase Strength and Stamina Yes       Intervention Provide advice, education, support and counseling about physical activity/exercise needs.;Develop an individualized exercise prescription for aerobic and resistive training based on initial evaluation findings,  risk stratification, comorbidities and participant's personal goals.       Expected Outcomes Short Term: Increase workloads from initial exercise prescription for resistance, speed, and METs.;Short Term: Perform resistance training exercises routinely during rehab and add in resistance training at home;Long Term: Improve cardiorespiratory fitness, muscular endurance and strength as measured by increased METs and functional capacity ( )       Able to understand and use rate of perceived exertion (RPE) scale Yes       Intervention Provide education and explanation on how to use RPE scale       Expected Outcomes Short Term: Able to use RPE daily in rehab to express subjective intensity level;Long Term:  Able to use RPE to guide intensity level when exercising independently       Knowledge and understanding of Target Heart Rate Range (THRR) Yes       Intervention Provide education and explanation of THRR including how the numbers were predicted and where they are located for reference       Expected Outcomes Short Term: Able to state/look up THRR;Long Term: Able to use THRR to govern intensity when exercising independently;Short Term: Able to use daily as guideline for intensity in rehab       Able to check pulse independently Yes       Intervention Provide education and demonstration on how to check pulse in carotid and radial arteries.;Review the importance of being able to check your own pulse for safety during independent exercise       Expected Outcomes Short Term: Able to explain why pulse checking is important during independent exercise;Long Term: Able to check pulse independently  and accurately       Understanding of Exercise Prescription Yes       Intervention Provide education, explanation, and written materials on patient's individual exercise prescription       Expected Outcomes Short Term: Able to explain program exercise prescription;Long Term: Able to explain home exercise prescription to  exercise independently              Exercise Goals Re-Evaluation:  Exercise Goals Re-Evaluation    Row Name 05/01/20 0808 05/24/20 0700 05/29/20 0725 06/14/20 0945       Exercise Goal Re-Evaluation   Exercise Goals Review Increase Physical Activity;Able to understand and use rate of perceived exertion (RPE) scale -- Increase Physical Activity;Able to understand and use rate of perceived exertion (RPE) scale;Understanding of Exercise Prescription;Knowledge and understanding of Target Heart Rate Range (THRR);Increase Strength and Stamina --    Comments Patient able to understand and use RPE scale appropriately. Patient called out today due to family matter. Unable to review METs and goals. Reviewed home exercise guidelines with patient including endpoints, temperature precautions, target heart rate and rate of perceived exertion. Pt has an elliptical machine at home that he is using 15-20 minutes, 3 days/week as his mode of home exercise. Encouraged patient to try increasing duration to achieve 30 minutes daily, and patient is amenable to this. Patient has weights at home but is not currently using the because of prior shoulder surgery. Patient does not use weights at cardiac rehab either secondary to shoulder issues. Pt voices understanding of instructions given. Patient unable to complete the phase 2 cardiac rehab program due to chronic hip/back pain. Per prior discussion with the nurse case manager, patient will be discharged from the program at this time.    Expected Outcomes Increase workloads as tolerated to help improve cardiorespiratory fitness. -- Patient will increase his exercise duration at home to 30 minutes, 3 days/week to help improve cardiorespiratory fitness. --           Nutrition & Weight - Outcomes:  Pre Biometrics - 04/27/20 1545      Pre Biometrics   Height 5\' 10"  (1.778 m)    Weight 270 lb 1 oz (122.5 kg)    Waist Circumference 54 inches    Hip Circumference 51 inches     Waist to Hip Ratio 1.06 %    BMI (Calculated) 38.75    Triceps Skinfold 35 mm    % Body Fat 41.8 %    Grip Strength 34 kg    Flexibility 0 in    Single Leg Stand 0 seconds            Nutrition:  Nutrition Therapy & Goals - 05/26/20 0831      Nutrition Therapy   Diet heart healthy      Personal Nutrition Goals   Nutrition Goal Pt to identify food quantities necessary to achieve weight loss of 6-24 lb at graduation from cardiac rehab.    Personal Goal #2 Pt to build a healthy plate including vegetables, fruits, whole grains, and low-fat dairy products in a heart healthy meal plan.    Personal Goal #3 Pt to eat out less than 2 times per week      Intervention Plan   Intervention Prescribe, educate and counsel regarding individualized specific dietary modifications aiming towards targeted core components such as weight, hypertension, lipid management, diabetes, heart failure and other comorbidities.    Expected Outcomes Short Term Goal: Understand basic principles of dietary content, such as  calories, fat, sodium, cholesterol and nutrients.           Nutrition Discharge:   Education Questionnaire Score:  Knowledge Questionnaire Score - 04/27/20 1600      Knowledge Questionnaire Score   Pre Score 19/24           Mr. Penado is discharged from CR today for failure to show for exercise session 06/14/20 as previously agreed

## 2020-06-16 ENCOUNTER — Ambulatory Visit (HOSPITAL_COMMUNITY): Payer: 59

## 2020-06-16 ENCOUNTER — Encounter (HOSPITAL_COMMUNITY): Payer: 59

## 2020-06-19 ENCOUNTER — Other Ambulatory Visit (HOSPITAL_COMMUNITY): Payer: Self-pay | Admitting: Cardiology

## 2020-06-19 ENCOUNTER — Encounter (HOSPITAL_COMMUNITY): Payer: 59 | Admitting: Cardiology

## 2020-06-19 ENCOUNTER — Ambulatory Visit (HOSPITAL_COMMUNITY): Payer: 59

## 2020-06-19 ENCOUNTER — Encounter (HOSPITAL_COMMUNITY): Payer: 59

## 2020-06-21 ENCOUNTER — Ambulatory Visit (HOSPITAL_COMMUNITY): Payer: 59

## 2020-06-21 ENCOUNTER — Encounter (HOSPITAL_COMMUNITY): Payer: 59

## 2020-06-23 ENCOUNTER — Encounter (HOSPITAL_COMMUNITY): Payer: 59

## 2020-06-23 ENCOUNTER — Ambulatory Visit (HOSPITAL_COMMUNITY): Payer: 59

## 2020-07-06 ENCOUNTER — Telehealth: Payer: Self-pay

## 2020-07-06 NOTE — Telephone Encounter (Signed)
error 

## 2020-07-12 ENCOUNTER — Telehealth: Payer: Self-pay

## 2020-07-12 NOTE — Telephone Encounter (Signed)
Patient is requesting to be worked tomorrow at 10/28 patient states he cant walk and its a pressing matter

## 2020-07-13 NOTE — Progress Notes (Signed)
Phone [phone] In person visit   Subjective:   John [name] is a 59 y.o. year old very pleasant male patient who presents for/with See problem oriented charting Chief Complaint  Patient presents with  . Sciatica   This visit occurred during the SARS-CoV-2 public health emergency.  Safety protocols were in place, including screening questions prior to the visit, additional usage of staff PPE, and extensive cleaning of exam room while observing appropriate contact time as indicated for disinfecting solutions.   Past Medical History-  Patient Active Problem List   Diagnosis Date Noted  . Paroxysmal atrial fibrillation (HCC) 04/14/2020    Priority: High  . S/P ICD (internal cardiac defibrillator) procedure 04/14/2020    Priority: High  . Status post reverse total shoulder replacement, left 11/11/2019    Priority: High  . Non-ischemic cardiomyopathy (HCC)     Priority: High  . Chronic combined systolic and diastolic CHF, NYHA class 3 (HCC) 10/2016    Priority: High  . Alcohol dependence in remission (HCC) 03/19/2016    Priority: High  . GAD (generalized anxiety disorder) 05/31/2020    Priority: Medium  . Hyperlipidemia 05/27/2018    Priority: Medium  . Essential hypertension 11/08/2016    Priority: Medium  . GI bleed 03/19/2016    Priority: Medium  . ED (erectile dysfunction) 08/21/2015    Priority: Medium  . Hyperglycemia 08/21/2015    Priority: Medium  . Insomnia 08/21/2015    Priority: Medium  . Sleep apnea 08/21/2015    Priority: Medium  . Unspecified fracture of upper end of left humerus, initial encounter for closed fracture 09/03/2017    Priority: Low  . S/P shoulder replacement 11/24/2015    Priority: Low  . Allergic rhinitis 08/21/2015    Priority: Low  . Degenerative joint disease of left hip 03/27/2015    Priority: Low    Medications- reviewed and updated Current Outpatient Medications  Medication Sig Dispense Refill  . carvedilol (COREG) 25 MG  tablet Take 1 tablet (25 mg total) by mouth 2 (two) times daily with a meal. 60 tablet 3  . ELIQUIS 5 MG TABS tablet TAKE 1 TABLET BY MOUTH TWICE A DAY 180 tablet 3  . FARXIGA 10 MG TABS tablet TAKE 1 TABLET BY MOUTH DAILY BEFORE BREAKFAST 30 tablet 11  . furosemide (LASIX) 40 MG tablet Take 1 tablet (40 mg total) by mouth every morning AND 0.5 tablets (20 mg total) every evening. 135 tablet 3  . icosapent Ethyl (VASCEPA) 1 g capsule Take 2 capsules (2 g total) by mouth 2 (two) times daily. 120 capsule 3  . LORazepam (ATIVAN) 1 MG tablet Take 1 tablet (1 mg total) by mouth daily as needed for anxiety (do not take ambien within 8 hours of lorazepam). 30 tablet 5  . Multiple Vitamins-Minerals (MULTIVITAMIN WITH MINERALS) tablet Take 1 tablet by mouth daily.    . rosuvastatin (CRESTOR) 20 MG tablet TAKE 1 TABLET BY MOUTH EVERY DAY 90 tablet 3  . sacubitril-valsartan (ENTRESTO) 97-103 MG Take 1 tablet by mouth 2 (two) times daily. 60 tablet 5  . sildenafil (VIAGRA) 100 MG tablet TAKE 0.5 TABLETS (50 MG TOTAL) BY MOUTH AT BEDTIME AS NEEDED FOR ERECTILE DYSFUNCTION. 10 tablet 3  . spironolactone (ALDACTONE) 25 MG tablet Take 25 mg by mouth daily.    Marland Kitchen zolpidem (AMBIEN) 10 MG tablet Take 1 tablet (10 mg total) by mouth at bedtime as needed for sleep. 30 tablet 5  . escitalopram (LEXAPRO) 10 MG tablet Take  1 tablet (10 mg total) by mouth daily. 90 tablet 3   No current facility-administered medications for this visit.     Objective:  BP 132/72   Pulse 84   Temp (!) 96.2 F (35.7 C) (Temporal)   Ht 5\' 10"  (1.778 m)   Wt 260 lb 12.8 oz (118.3 kg)   SpO2 98%   BMI 37.42 kg/m  Gen: NAD, resting comfortably CV: RRR no murmurs rubs or gallops Lungs: CTAB no crackles, wheeze, rhonchi Ext: no edema Skin: warm, dry  straight leg raise without paresthesias but has significant pain into anterior right leg and groin.  Pain with palpation of right low back paraspinous muscles but no midline pain.  Reports  pain in the groin with flexion of hip    Assessment and Plan   #Right low back pain with suspected sciatica  S: Patient with now 4 to 5 months of pain.  Pain previously was more in right low back but was also having into groin area- now seems to be more focused in front of right leg and into right groin (back still present but not as bad- gf bought him a spray which helps short term). Walking still worsens pain. Leaning forward on cart helps some.  Denies numbness or tingling  Last visit 05/31/20 we did a methylprednisolone injection- had improvement for 2-3 weeks. Pain up to 10/10 before shot and went down to 2/10. In recent weeks has gone back up to 10/10. Tylenol is helpful ( 4 per day typically- 2000mg ).   A/P: Patient had significant relief for several weeks with Depo-Medrol previously and has an important interview coming up on Tuesday-he requested a repeat at this time-my staff has left by the time of this discussion we will have patient back this afternoon to complete Depo-Medrol 80 mg injection into the buttocks.  Given pain has now been going on for 4 to 5 months and recurred after steroid injection-I asked him to see sports medicine and he agrees.  I suspect this is related to his back with sciatica but also has some groin pain-did have some groin pain with hip flexion but not with internal rotation or external rotation of the hip-I do not strongly suspect hip arthritis but I am asking for sports medicine's opinion on imaging.  Offered x-ray of the back today but patient would prefer to do it at time of visit  #GeneralizedAnxiety disorder S:Medication: zoloft 100mg  . No thoughts of self harm  Being out of work is still be trigger for him as well as the pain he is struggling with.  Issue started after myocarditis/heart failure issues  GAD 7 : Generalized Anxiety Score 07/14/2020  Nervous, Anxious, on Edge 3  Control/stop worrying 3  Worry too much - different things 1  Trouble  relaxing 3  Restless 1  Easily annoyed or irritable 3  Afraid - awful might happen 3  Total GAD 7 Score 17  Anxiety Difficulty Somewhat difficult  A/P: Patient struggling despite increase in Zoloft to 100 mg.  No SI.  Poor control of GAD.  Stop Zoloft 100 mg.  Start Lexapro 10 mg-Lexapro 10 to have less anxiety/irritability and may need to titrate to 20 mg at next visit-also has lorazepam available-need to check in on frequency of use next visit-we were trying Lexapro/Zoloft to see if we could reduce Ativan need and control symptoms better  Recommended follow up: Keep December visit Future Appointments  Date Time Provider Department Center  08/03/2020  7:05 AM  [facility] DEVICE REMOTES [facility]  08/04/2020 11:00 AM Laurey Morale, MD [facility] None  08/22/2020  4:00 PM Shelva Majestic, MD [facility]  11/02/2020  7:05 AM [facility] DEVICE REMOTES [facility]  02/01/2021  7:05 AM [facility] DEVICE REMOTES [facility]  05/03/2021  7:05 AM [facility] DEVICE REMOTES [facility]  08/02/2021  7:05 AM [facility] DEVICE REMOTES [facility]  11/01/2021  7:05 AM [facility] DEVICE REMOTES [facility]  01/31/2022  7:05 AM [facility] DEVICE REMOTES [facility]    Lab/Order associations:   ICD-10-CM   1. Chronic right-sided low back pain with right-sided sciatica  M54.41 Ambulatory referral to Sports Medicine   G89.29   2. GAD (generalized anxiety disorder)  F41.1     Meds ordered this encounter  Medications  . escitalopram (LEXAPRO) 10 MG tablet    Sig: Take 1 tablet (10 mg total) by mouth daily.    Dispense:  90 tablet    Refill:  3   Return precautions advised.  [name], MD

## 2020-07-13 NOTE — Patient Instructions (Addendum)
Health Maintenance Due  Topic Date Due  . COLONOSCOPY - wants to hold off for now Never done   Stop zoloft after today. Start lexapro 10 mg tomorrow. Very similar safety profile but lexapro tends to be slightly better for anxiety/sleep  We will call you about the injection to come back for this today depo medrol 80mg   We will call you within two weeks about your referral to sports medicine. If you do not hear within 3 weeks, give a call.

## 2020-07-13 NOTE — Telephone Encounter (Signed)
Call patient LVM to call office back to get triaged

## 2020-07-14 ENCOUNTER — Encounter: Payer: Self-pay | Admitting: Family Medicine

## 2020-07-14 ENCOUNTER — Ambulatory Visit (INDEPENDENT_AMBULATORY_CARE_PROVIDER_SITE_OTHER): Payer: 59 | Admitting: Family Medicine

## 2020-07-14 ENCOUNTER — Other Ambulatory Visit: Payer: Self-pay

## 2020-07-14 VITALS — BP 132/72 | HR 84 | Temp 96.2°F | Ht 70.0 in | Wt 260.8 lb

## 2020-07-14 DIAGNOSIS — F411 Generalized anxiety disorder: Secondary | ICD-10-CM

## 2020-07-14 DIAGNOSIS — M5441 Lumbago with sciatica, right side: Secondary | ICD-10-CM

## 2020-07-14 DIAGNOSIS — G8929 Other chronic pain: Secondary | ICD-10-CM

## 2020-07-14 MED ORDER — ESCITALOPRAM OXALATE 10 MG PO TABS: 10.0000 mg | ORAL_TABLET | Freq: Every day | ORAL | 3 refills | Status: DC

## 2020-07-14 MED ORDER — METHYLPREDNISOLONE ACETATE 80 MG/ML IJ SUSP
80.0000 mg | Freq: Once | INTRAMUSCULAR | Status: AC
  Administered 2020-07-14: 80 mg via INTRAMUSCULAR

## 2020-07-14 NOTE — Assessment & Plan Note (Signed)
#  Generalized Anxiety disorder S:Medication: zoloft 100mg  . No thoughts of self harm  Being out of work is still be trigger for him as well as the pain he is struggling with.  Issue started after myocarditis/heart failure issues  GAD 7 : Generalized Anxiety Score 07/14/2020  Nervous, Anxious, on Edge 3  Control/stop worrying 3  Worry too much - different things 1  Trouble relaxing 3  Restless 1  Easily annoyed or irritable 3  Afraid - awful might happen 3  Total GAD 7 Score 17  Anxiety Difficulty Somewhat difficult  A/P: Patient struggling despite increase in Zoloft to 100 mg.  No SI.  Poor control of GAD.  Stop Zoloft 100 mg.  Start Lexapro 10 mg-Lexapro 10 to have less anxiety/irritability and may need to titrate to 20 mg at next visit-also has lorazepam available-need to check in on frequency of use next visit-we were trying Lexapro/Zoloft to see if we could reduce Ativan need and control symptoms better

## 2020-07-14 NOTE — Addendum Note (Signed)
Addended by: Manuela Schwartz on: 07/14/2020 02:19 PM   Modules accepted: Orders

## 2020-07-19 ENCOUNTER — Ambulatory Visit: Payer: 59 | Admitting: Family Medicine

## 2020-07-19 NOTE — Progress Notes (Deleted)
   I, John [name], LAT, ATC acting as a scribe for John Graham, MD.  Subjective:    I'm seeing this patient as a consultation for Dr. [name]. Note will be routed back to referring provider/PCP.  CC: R LB pn c sciatica and R leg/groin pn  HPI: Pt is a 59 y/o male presenting w/ c/o R-sided LBP and R LE pain x 4-5 months.  LE weakness: LE numbness/tingling: No Aggravating factors: walking; Treatments tried:  Rx:07/14/20 Depo-Medrol 80 mg injection, site= buttocks  Past medical history, Surgical history, Family history, Social history, Allergies, and medications have been entered into the medical record, reviewed. ***  Review of Systems: No new headache, visual changes, nausea, vomiting, diarrhea, constipation, dizziness, abdominal pain, skin rash, fevers, chills, night sweats, weight loss, swollen lymph nodes, body aches, joint swelling, muscle aches, chest pain, shortness of breath, mood changes, visual or auditory hallucinations.   Objective:   There were no vitals filed for this visit. General: Well Developed, well nourished, and in no acute distress.  Neuro/Psych: Alert and oriented x3, extra-ocular muscles intact, able to move all 4 extremities, sensation grossly intact. Skin: Warm and dry, no rashes noted.  Respiratory: Not using accessory muscles, speaking in full sentences, trachea midline.  Cardiovascular: Pulses palpable, no extremity edema. Abdomen: Does not appear distended. MSK: ***  Lab and Radiology Results No results found for this or any previous visit (from the past 72 hour(s)). No results found.  Impression and Recommendations:    Assessment and Plan: 59 y.o. male with ***.  PDMP not reviewed this encounter. No orders of the defined types were placed in this encounter.  No orders of the defined types were placed in this encounter.   Discussed warning signs or symptoms. Please see discharge instructions. Patient expresses  understanding.   ***

## 2020-07-20 ENCOUNTER — Ambulatory Visit: Payer: 59 | Admitting: Family Medicine

## 2020-07-21 NOTE — Progress Notes (Signed)
Subjective:    I'm seeing this patient as a consultation for Dr. [name]. Note will be routed back to referring provider/PCP.  CC: Chronic LBP w/ sciatica and pn in R groin/ant thigh  I, Molly Weber, LAT, ATC, am serving as scribe for Dr. [name].  HPI: Pt reports LBP that radiates into the R groin and ant thigh x  5-6 months. PCP rx c Depo-Medrol injection on 05/31/20.  Radiating pain: yes into his R post-lat thigh and into his R groin LE numbness/tingling: No LE weakness: No Aggravating factors: Walking sometimes Rx tried: Tylenol; Depo-medrol IM injection on 05/31/20;   Past medical history, Surgical history, Family history, Social history, Allergies, and medications have been entered into the medical record, reviewed. Heart failure and atrial fibrillation.  Status post left replacement and shoulder replacement.  Review of Systems: No new headache, visual changes, nausea, vomiting, diarrhea, constipation, dizziness, abdominal pain, skin rash, fevers, chills, night sweats, weight loss, swollen lymph nodes, body aches, joint swelling, muscle aches, chest pain, shortness of breath, mood changes, visual or auditory hallucinations.   Objective:    Vitals:   07/24/20 1555  BP: 108/76  Pulse: 65  SpO2: 98%   General: Well Developed, well nourished, and in no acute distress.  Neuro/Psych: Alert and oriented x3, extra-ocular muscles intact, able to move all 4 extremities, sensation grossly intact. Skin: Warm and dry, no rashes noted.  Respiratory: Not using accessory muscles, speaking in full sentences, trachea midline.  Cardiovascular: Pulses palpable, no extremity edema. Abdomen: Does not appear distended. MSK: L-spine normal-appearing nontender midline.  Tender palpation perispinal musculature right worse than left. Decreased lumbar motion. Right hip normal-appearing nontender decreased hip range of motion.  Some pain with flexion and internal rotation. Reflexes lower  semistrength intact distal bilateral extremities. Negative slump test.  Lab and Radiology Results X-ray images L-spine and hip ordered today will be obtained at a later date outside location.  Impression and Recommendations:    Assessment and Plan: 59 y.o. male with chronic low back and groin pain concerning for lumbar DDD and facet DJD and right hip DJD.  Plan for x-rays to be obtained in the near future.  Plan for trial of physical therapy.  Will prescribe gabapentin for the possibility of lumbar radiculopathy.  Continue Tylenol as needed.  Limited diclofenac gel trial as this may be somewhat helpful.  Recheck in about 6 weeks.  Return sooner if needed.Marland Kitchen  PDMP not reviewed this encounter. Orders Placed This Encounter  Procedures  . DG Lumbar Spine 2-3 Views    Standing Status:   Future    Standing Expiration Date:   07/24/2021    Order Specific Question:   Reason for Exam (SYMPTOM  OR DIAGNOSIS REQUIRED)    Answer:   eval lumbar pain    Order Specific Question:   Preferred imaging location?    Answer:   [facility]  . DG HIP UNILAT WITH PELVIS 2-3 VIEWS RIGHT    Standing Status:   Future    Standing Expiration Date:   07/24/2021    Order Specific Question:   Reason for Exam (SYMPTOM  OR DIAGNOSIS REQUIRED)    Answer:   eval right hip pain    Order Specific Question:   Preferred imaging location?    Answer:   [facility]  . Ambulatory referral to Physical Therapy    Referral Priority:   Routine    Referral Type:   Physical Medicine  Referral Reason:   Specialty Services Required    Requested Specialty:   Physical Therapy   Meds ordered this encounter  Medications  . gabapentin (NEURONTIN) 300 MG capsule    Sig: Take 1 capsule (300 mg total) by mouth 3 (three) times daily as needed.    Dispense:  90 capsule    Refill:  3  . diclofenac Sodium (VOLTAREN) 1 % GEL    Sig: Apply 4 g topically 4 (four) times daily. To affected joint.    Dispense:  100 g     Refill:  11    Discussed warning signs or symptoms. Please see discharge instructions. Patient expresses understanding.   The above documentation has been reviewed and is accurate and complete [name], M.D.

## 2020-07-24 ENCOUNTER — Other Ambulatory Visit: Payer: Self-pay

## 2020-07-24 ENCOUNTER — Encounter: Payer: Self-pay | Admitting: Family Medicine

## 2020-07-24 ENCOUNTER — Other Ambulatory Visit (HOSPITAL_COMMUNITY): Payer: Self-pay | Admitting: Cardiology

## 2020-07-24 ENCOUNTER — Ambulatory Visit (INDEPENDENT_AMBULATORY_CARE_PROVIDER_SITE_OTHER): Payer: 59 | Admitting: Family Medicine

## 2020-07-24 VITALS — BP 108/76 | HR 65 | Ht 70.0 in | Wt 267.2 lb

## 2020-07-24 DIAGNOSIS — M545 Low back pain, unspecified: Secondary | ICD-10-CM | POA: Diagnosis not present

## 2020-07-24 DIAGNOSIS — M25551 Pain in right hip: Secondary | ICD-10-CM | POA: Diagnosis not present

## 2020-07-24 MED ORDER — DICLOFENAC SODIUM 1 % EX GEL: 4.0000 g | Freq: Four times a day (QID) | CUTANEOUS | 11 refills | Status: DC

## 2020-07-24 MED ORDER — GABAPENTIN 300 MG PO CAPS: 300.0000 mg | ORAL_CAPSULE | Freq: Three times a day (TID) | ORAL | 3 refills | Status: DC | PRN

## 2020-07-24 NOTE — Patient Instructions (Addendum)
Thank you for coming in today.  Plan for xray.  Plan for PT.   Try the gabapentin at bedtime as needled.  It it helps you can take it up to 3x daily as needed.   Recheck in 6 weeks.

## 2020-07-25 ENCOUNTER — Ambulatory Visit (INDEPENDENT_AMBULATORY_CARE_PROVIDER_SITE_OTHER): Payer: 59

## 2020-07-25 ENCOUNTER — Telehealth: Payer: Self-pay | Admitting: Family Medicine

## 2020-07-25 DIAGNOSIS — M545 Low back pain, unspecified: Secondary | ICD-10-CM

## 2020-07-25 DIAGNOSIS — M25551 Pain in right hip: Secondary | ICD-10-CM

## 2020-07-25 NOTE — Telephone Encounter (Signed)
Pt calling for xray results, OK to send via MyChart unless discussion needed.

## 2020-07-26 ENCOUNTER — Telehealth: Payer: Self-pay | Admitting: Family Medicine

## 2020-07-26 DIAGNOSIS — M25551 Pain in right hip: Secondary | ICD-10-CM

## 2020-07-26 DIAGNOSIS — M545 Low back pain, unspecified: Secondary | ICD-10-CM

## 2020-07-26 NOTE — Progress Notes (Signed)
X-ray lumbar spine some some arthritis at the base of the spine

## 2020-07-26 NOTE — Progress Notes (Signed)
X-ray right hip shows medium to severe arthritis.  This would cause some of the groin pain and thigh pain that you are experiencing.

## 2020-07-26 NOTE — Telephone Encounter (Signed)
Patient called stating that he was contacted by PT but they are not able to get him in until the end of the month. He would like to get started sooner if possible. Is there another location he could go to?  He also said that something was mentioned to him about an Epidural injection. He asked if this would be beneficial?   Please advise.

## 2020-07-26 NOTE — Progress Notes (Signed)
X-ray lumbar spine shows some arthritis at the base the spine otherwise is normal to radiology

## 2020-07-27 ENCOUNTER — Encounter: Payer: Self-pay | Admitting: Physical Therapy

## 2020-07-27 NOTE — Telephone Encounter (Signed)
Sent Dr. Zollie Pee response to pt via MyChart but will attempt to call pt as well.

## 2020-07-27 NOTE — Telephone Encounter (Signed)
Pt has received the MyChart message from earlier regarding Dr. Zollie Pee response.

## 2020-07-27 NOTE — Telephone Encounter (Signed)
Hello John [name],  Thank you for contacting me. I will refer to a second physical therapy location that may be able to get you in a bit faster.  [facility] in [place] usually has pretty good access.  Let us try them first.  As for the epidural steroid injection absolutely that could be helpful.  However typically will need an MRI first and insurance will often require a trial of 6 weeks of conservative management before approving a MRI in situations like yours.  That clock started when you talked about this with Dr. Durene Cal on October 29.  However if very uncomfortable we could do a few things in the meantime.  Because you have more significant arthritis in your right hip I could do a hip injection here in clinic at any time.  Please schedule an appointment for that if needed.  Additionally could prescribe oral prednisone as this may be somewhat helpful especially for pain radiating down your leg.  That medicine however will cause increased blood sugar and jitteriness and should be avoided if possible.  Let me know what you would like.  Look out for [facility] in [place] to contact you about physical therapy.  Clayburn Pert

## 2020-07-28 NOTE — Progress Notes (Addendum)
I, [name], LAT, ATC acting as a scribe for [name], MD.  John [name] is a 59 y.o. male who presents to [facility] today for f/u of R hip pain. Pt had sent a MyChart message wanting to schedule a f/u appointment to get an injection. Today, pt reports increased pn in his right hip. PT has not been able to get him in until Monday, 11/22. Pt starts new job 08/21/20. No relief w/ current pn meds. Tylenol at PM when increased pn, w/ little-no benefit.Pt c/o having trouble sleeping and requested medication for some relief.  Dx imaging: 07/25/20 R hip XR and L-spine XR   Pertinent review of systems: No fevers or chills  Relevant historical information: Heart failure.  Left hip replacement   Exam:  BP 132/74 (BP Location: Right Arm, Patient Position: Sitting, Cuff Size: Normal)   Pulse 70   Ht 5\' 10"  (1.778 m)   Wt 273 lb 9.6 oz (124.1 kg)   SpO2 98%   BMI 39.26 kg/m  General: Well Developed, well nourished, and in no acute distress.   MSK: Right hip normal-appearing decreased muscle bulk thighs bilaterally.  Decreased hip motion.  Nontender.    Lab and Radiology Results  Procedure: Real-time Ultrasound Guided Injection of right hip femoral acetabular joint Device: Philips Affiniti 50G Images permanently stored and available for review in PACS Verbal informed consent obtained.  Discussed risks and benefits of procedure. Warned about infection bleeding damage to structures skin hypopigmentation and fat atrophy among others. Patient expresses understanding and agreement Time-out conducted.   Noted no overlying erythema, induration, or other signs of local infection.   Skin prepped in a sterile fashion.   Local anesthesia: Topical Ethyl chloride.   With sterile technique and under real time ultrasound guidance:  40 mg of Kenalog and 2 mL of Marcaine injected into right hip. Fluid seen entering the femoral acetabular joint capsule.    Completed without difficulty   Pain immediately resolved suggesting accurate placement of the medication.   Advised to call if fevers/chills, erythema, induration, drainage, or persistent bleeding.   Images permanently stored and available for review in the ultrasound unit.  Impression: Technically successful ultrasound guided injection.      Assessment and Plan: 59 y.o. male with right hip pain due to DJD.  Patient also has some back pain and leg pain due to lumbar dysfunction.  Plan for injection today for diagnosis and hopefully more long-lasting treatment.  Patient is starting physical therapy on November 22 which should be helpful.  Recheck back as scheduled on December 20.  Check back sooner if needed.  Of note also having difficulty sleeping that he attributes to pain.  After discussion plan for trial of trazodone.  This should help sleep without being too sedated and causing complications with his other medical problems.  Orders Placed This Encounter  Procedures  . December 22 LIMITED JOINT SPACE STRUCTURES LOW RIGHT(NO LINKED CHARGES)    Standing Status:   Future    Number of Occurrences:   1    Standing Expiration Date:   01/28/2021    Order Specific Question:   Reason for Exam (SYMPTOM  OR DIAGNOSIS REQUIRED)    Answer:   chronic right hip pain    Order Specific Question:   Preferred imaging location?    Answer:   [facility]   No orders of the defined types were placed in this encounter.    Discussed warning  signs or symptoms. Please see discharge instructions. Patient expresses understanding.   The above documentation has been reviewed and is accurate and complete [name], M.D.

## 2020-07-31 ENCOUNTER — Ambulatory Visit: Payer: Self-pay

## 2020-07-31 ENCOUNTER — Other Ambulatory Visit: Payer: Self-pay

## 2020-07-31 ENCOUNTER — Ambulatory Visit (INDEPENDENT_AMBULATORY_CARE_PROVIDER_SITE_OTHER): Payer: 59 | Admitting: Family Medicine

## 2020-07-31 ENCOUNTER — Encounter: Payer: Self-pay | Admitting: Family Medicine

## 2020-07-31 VITALS — BP 132/74 | HR 70 | Ht 70.0 in | Wt 273.6 lb

## 2020-07-31 DIAGNOSIS — M25551 Pain in right hip: Secondary | ICD-10-CM | POA: Diagnosis not present

## 2020-07-31 MED ORDER — TRAZODONE HCL 50 MG PO TABS: 50.0000 mg | ORAL_TABLET | Freq: Every day | ORAL | 1 refills | Status: DC

## 2020-07-31 NOTE — Addendum Note (Signed)
Addended by: Rodolph Bong on: 07/31/2020 01:48 PM   Modules accepted: Orders

## 2020-07-31 NOTE — Patient Instructions (Signed)
Thank you for coming in today.  Call or go to the ER if you develop a large red swollen joint with extreme pain or oozing puss.   Keep me updated,  Recheck as needed.

## 2020-08-03 ENCOUNTER — Ambulatory Visit: Payer: 59

## 2020-08-04 ENCOUNTER — Inpatient Hospital Stay (HOSPITAL_COMMUNITY): Admission: RE | Admit: 2020-08-04 | Payer: 59 | Source: Ambulatory Visit | Admitting: Cardiology

## 2020-08-07 ENCOUNTER — Other Ambulatory Visit: Payer: Self-pay

## 2020-08-07 ENCOUNTER — Ambulatory Visit (INDEPENDENT_AMBULATORY_CARE_PROVIDER_SITE_OTHER): Payer: 59 | Admitting: Physical Therapy

## 2020-08-07 ENCOUNTER — Encounter: Payer: Self-pay | Admitting: Physical Therapy

## 2020-08-07 DIAGNOSIS — M25551 Pain in right hip: Secondary | ICD-10-CM | POA: Diagnosis not present

## 2020-08-07 DIAGNOSIS — M545 Low back pain, unspecified: Secondary | ICD-10-CM

## 2020-08-07 DIAGNOSIS — G8929 Other chronic pain: Secondary | ICD-10-CM | POA: Diagnosis not present

## 2020-08-07 NOTE — Patient Instructions (Signed)
Access Code: [email or web address] URL: [email or web address] Date: 08/07/2020 Prepared by: Sedalia Muta  Exercises Supine Single Knee to Chest Stretch - 2 x daily - 3 reps - 30 hold Bent Knee Fallouts - 2 x daily - 1 sets - 10 reps - 5 hold Hooklying Isometric Clamshell - 2 x daily - 2 sets - 10 reps Supine March - 2 x daily - 2 sets - 10 reps

## 2020-08-15 ENCOUNTER — Encounter: Payer: 59 | Admitting: Physical Therapy

## 2020-08-15 ENCOUNTER — Other Ambulatory Visit: Payer: Self-pay

## 2020-08-15 ENCOUNTER — Telehealth (INDEPENDENT_AMBULATORY_CARE_PROVIDER_SITE_OTHER): Payer: 59 | Admitting: Family Medicine

## 2020-08-15 DIAGNOSIS — R062 Wheezing: Secondary | ICD-10-CM

## 2020-08-15 DIAGNOSIS — R059 Cough, unspecified: Secondary | ICD-10-CM

## 2020-08-15 MED ORDER — ALBUTEROL SULFATE HFA 108 (90 BASE) MCG/ACT IN AERS: 2.0000 | INHALATION_SPRAY | Freq: Four times a day (QID) | RESPIRATORY_TRACT | 0 refills | Status: DC | PRN

## 2020-08-15 MED ORDER — BENZONATATE 100 MG PO CAPS: 100.0000 mg | ORAL_CAPSULE | Freq: Three times a day (TID) | ORAL | 0 refills | Status: DC | PRN

## 2020-08-15 NOTE — Patient Instructions (Addendum)
°  HOME CARE TIPS:  -[place] COVID19 testing information: [email or web address] OR [phone] Most pharmacies also offer testing and home test kits.  -I sent the medication(s) we discussed to your pharmacy: Meds ordered this encounter  Medications   benzonatate (TESSALON PERLES) 100 MG capsule    Sig: Take 1 capsule (100 mg total) by mouth 3 (three) times daily as needed.    Dispense:  20 capsule    Refill:  0   albuterol (PROAIR HFA) 108 (90 Base) MCG/ACT inhaler    Sig: Inhale 2 puffs into the lungs every 6 (six) hours as needed for wheezing or shortness of breath.    Dispense:  1 each    Refill:  0     -COVID19 outpatient treatment center: [phone] (only call if your Covid test is positive and you are interested in monoclonal antibody treatment which is available to those with risk factors within 10 days of symptom onset)  -can use tylenol  if needed for fevers, aches and pains per instructions  -can use nasal saline a few times per day if nasal congestion  -stay hydrated, drink plenty of fluids and eat small healthy meals - avoid dairy  -If the Covid test is positive, check out the [facility] website for more information on home care, transmission and treatment for COVID19  -follow up with your doctor in 2-3 days unless improving and feeling better  -stay home while sick, except to seek medical care, and if you have COVID19 please stay home for a full 10 days since the onset of symptoms PLUS one day of no fever and feeling better.  It was nice to meet you today, and I really hope you are feeling better soon. I help [facility] out with telemedicine visits on Tuesdays and Thursdays and am available for visits on those days. If you have any concerns or questions following this visit please schedule a follow up visit with your Primary Care doctor or seek care at a local urgent care clinic to avoid delays in care.    Seek in person care  promptly if your symptoms worsen, new concerns arise or you are not improving with treatment. Call 911 and/or seek emergency care if you symptoms are severe or life threatening.

## 2020-08-15 NOTE — Progress Notes (Signed)
Virtual Visit via Video Note  I connected with John [name]  on 08/15/20 at  3:20 PM EST by a video enabled telemedicine application and verified that I am speaking with the correct person using two identifiers.  Location patient: home, [place] Location provider:work or home office Persons participating in the virtual visit: patient, provider  I discussed the limitations of evaluation and management by telemedicine and the availability of in person appointments. The patient expressed understanding and agreed to proceed.   HPI:  Acute telemedicine visit for cough: -Onset: 2 days ago -traveled over last week -no known sick contacts -Symptoms include: cough, nasal congestion, wheezing occ, body aches -Denies: fever, SOB, CP, NVD -Has tried: none -Pertinent past medical history: wheezing with resp illness - feels albuterol has helped in the past with this -Pertinent medication allergies: heart disease, hypertension, obesity -COVID-19 vaccine status: COVID19 vaccines and boosters; had flu shots   ROS: See pertinent positives and negatives per HPI.  Past Medical History:  Diagnosis Date  . Anxiety    Ativan  . Chronic combined systolic and diastolic CHF, NYHA class 3 (HCC) 10/2016   Nonischemic cardiomyopathy. EF 20-25%.  . Chronic left hip pain   . Depression    history of  . Dysrhythmia    A-FIB  . GI bleed 03/2016  . Headache   . Hypertensive heart disease with combined systolic and diastolic congestive heart failure (HCC) 10/2016  . Nonischemic cardiomyopathy (HCC) 10/2016   Echo with EF 20-25%. Cardiac catheterization with no CAD. LVEDP was 41 mmHg, PCWP 36 mmHg  . Osteoarthritis    right shoulder  . Right hand fracture   . Seasonal allergies   . Sleep apnea    wears a CPAP  . Wears glasses     Past Surgical History:  Procedure Laterality Date  . CARDIAC CATHETERIZATION    . CARDIOVERSION N/A 09/01/2019   Procedure: CARDIOVERSION;  Surgeon: Laurey Morale, MD;  Location:  [facility] ENDOSCOPY;  Service: Cardiovascular;  Laterality: N/A;  . ESOPHAGOGASTRODUODENOSCOPY (EGD) WITH PROPOFOL N/A 03/20/2016   Procedure: ESOPHAGOGASTRODUODENOSCOPY (EGD) WITH PROPOFOL;  Surgeon: Charlott Rakes, MD;  Location: [facility] ENDOSCOPY;  Service: Endoscopy;  Laterality: N/A;  . ICD IMPLANT N/A 05/06/2018   Procedure: ICD IMPLANT;  Surgeon: Thurmon Fair, MD;  Location: [facility] INVASIVE CV LAB;  Service: Cardiovascular;  Laterality: N/A;  . IRRIGATION AND DEBRIDEMENT SHOULDER Left 11/11/2019   Procedure: LEFT SHOULDER IRRIGATION AND DEBRIDEMENT WITH POLY EXCHANGE;  Surgeon: Jones Broom, MD;  Location: [facility] ORS;  Service: Orthopedics;  Laterality: Left;  . RIGHT/LEFT HEART CATH AND CORONARY ANGIOGRAPHY N/A 10/21/2016   Procedure: Right/Left Heart Cath and Coronary Angiography;  Surgeon: Runell Gess, MD;  Location: [facility] INVASIVE CV LAB;  Service: Cardiovascular: Angiographically normal coronary arteries. PCWP 33-36 mmHg, LVEDP 41 mmHg. PA pressure 60/35, mean 46 mmHg.  Cardiac output/cardiac index-3.93 /1.76 (Fick), 3.49/1.57 (thermodilution)  . TEE WITHOUT CARDIOVERSION N/A 09/01/2019   Procedure: TRANSESOPHAGEAL ECHOCARDIOGRAM (TEE);  Surgeon: Laurey Morale, MD;  Location: [facility] ENDOSCOPY;  Service: Cardiovascular;  Laterality: N/A;  . TOTAL HIP ARTHROPLASTY Left 03/27/2015   Procedure: LEFT TOTAL HIP ARTHROPLASTY ANTERIOR APPROACH;  Surgeon: Samson Frederic, MD;  Location: [facility] OR;  Service: Orthopedics;  Laterality: Left;  . TOTAL SHOULDER ARTHROPLASTY Right 11/24/2015   Procedure: RIGHT TOTAL SHOULDER ARTHROPLASTY;  Surgeon: Beverely Low, MD;  Location: [facility] OR;  Service: Orthopedics;  Laterality: Right;  . TOTAL SHOULDER ARTHROPLASTY Left 10/19/2019   Procedure: REVERSE TOTAL SHOULDER ARTHROPLASTY;  Surgeon: Jones Broom,  MD;  Location: [facility] ORS;  Service: Orthopedics;  Laterality: Left;  . TRANSTHORACIC ECHOCARDIOGRAM  10/2016   EF 20-25%. Diffuse hypokinesis but akinesis of the entire inferoseptal  wall and apical wall. Moderate biatrial enlargement. PA pressure estimated 64 mmHg.  . WISDOM TOOTH EXTRACTION       Current Outpatient Medications:  .  albuterol (PROAIR HFA) 108 (90 Base) MCG/ACT inhaler, Inhale 2 puffs into the lungs every 6 (six) hours as needed for wheezing or shortness of breath., Disp: 1 each, Rfl: 0 .  benzonatate (TESSALON PERLES) 100 MG capsule, Take 1 capsule (100 mg total) by mouth 3 (three) times daily as needed., Disp: 20 capsule, Rfl: 0 .  carvedilol (COREG) 25 MG tablet, TAKE 1 TABLET (25 MG TOTAL) BY MOUTH 2 (TWO) TIMES DAILY WITH A MEAL., Disp: 60 tablet, Rfl: 3 .  diclofenac Sodium (VOLTAREN) 1 % GEL, Apply 4 g topically 4 (four) times daily. To affected joint., Disp: 100 g, Rfl: 11 .  ELIQUIS 5 MG TABS tablet, TAKE 1 TABLET BY MOUTH TWICE A DAY, Disp: 180 tablet, Rfl: 3 .  ENTRESTO 97-103 MG, TAKE 1 TABLET BY MOUTH 2 (TWO) TIMES DAILY., Disp: 60 tablet, Rfl: 5 .  escitalopram (LEXAPRO) 10 MG tablet, Take 1 tablet (10 mg total) by mouth daily., Disp: 90 tablet, Rfl: 3 .  FARXIGA 10 MG TABS tablet, TAKE 1 TABLET BY MOUTH DAILY BEFORE BREAKFAST, Disp: 30 tablet, Rfl: 11 .  furosemide (LASIX) 40 MG tablet, Take 1 tablet (40 mg total) by mouth every morning AND 0.5 tablets (20 mg total) every evening., Disp: 135 tablet, Rfl: 3 .  gabapentin (NEURONTIN) 300 MG capsule, Take 1 capsule (300 mg total) by mouth 3 (three) times daily as needed., Disp: 90 capsule, Rfl: 3 .  LORazepam (ATIVAN) 1 MG tablet, Take 1 tablet (1 mg total) by mouth daily as needed for anxiety (do not take ambien within 8 hours of lorazepam)., Disp: 30 tablet, Rfl: 5 .  Multiple Vitamins-Minerals (MULTIVITAMIN WITH MINERALS) tablet, Take 1 tablet by mouth daily., Disp: , Rfl:  .  rosuvastatin (CRESTOR) 20 MG tablet, TAKE 1 TABLET BY MOUTH EVERY DAY, Disp: 90 tablet, Rfl: 3 .  sildenafil (VIAGRA) 100 MG tablet, TAKE 0.5 TABLETS (50 MG TOTAL) BY MOUTH AT BEDTIME AS NEEDED FOR ERECTILE DYSFUNCTION.,  Disp: 10 tablet, Rfl: 3 .  spironolactone (ALDACTONE) 25 MG tablet, TAKE 1 TABLET BY MOUTH EVERY DAY, Disp: 30 tablet, Rfl: 11 .  traZODone (DESYREL) 50 MG tablet, Take 1 tablet (50 mg total) by mouth at bedtime., Disp: 30 tablet, Rfl: 1 .  VASCEPA 1 g capsule, TAKE 2 CAPSULES (2 G TOTAL) BY MOUTH 2 (TWO) TIMES DAILY., Disp: 120 capsule, Rfl: 3 .  zolpidem (AMBIEN) 10 MG tablet, Take 1 tablet (10 mg total) by mouth at bedtime as needed for sleep., Disp: 30 tablet, Rfl: 5  EXAM:  VITALS per patient if applicable:  GENERAL: alert, oriented, appears well and in no acute distress  HEENT: atraumatic, conjunttiva clear, no obvious abnormalities on inspection of external nose and ears  NECK: normal movements of the head and neck  LUNGS: on inspection no signs of respiratory distress, breathing rate appears normal, no obvious gross SOB, gasping or wheezing  CV: no obvious cyanosis  MS: moves all visible extremities without noticeable abnormality  PSYCH/NEURO: pleasant and cooperative, no obvious depression or anxiety, speech and thought processing grossly intact  ASSESSMENT AND PLAN:  Discussed the following assessment and plan:  Cough  Wheeze  -we discussed possible serious and likely etiologies, options for evaluation and workup, limitations of telemedicine visit vs in person visit, treatment, treatment risks and precautions. Pt prefers to treat via telemedicine empirically rather than in person at this moment.  Query possible viral upper respiratory illness, Covid versus other.  Possible reactive airway disease or bronchitis, reports history of wheeze with respiratory illness.  He opted for empiric treatment with Tessalon for cough, albuterol as needed if any wheezing and other symptomatic over-the-counter treatments which were summarized in patient instructions.  Advised Covid testing and discussed options, treatment, potential complications, precautions and staying home while sick for  full 10 days if Covid test is positive.  Less likely given he is fully vaccinated.  Discussed possibility of flu as well -he feels less likely and opted against Tamiflu.  Scheduled follow up with PCP offered: Agrees to follow-up if needed Advised to seek prompt in person care if worsening, new symptoms arise, or if is not improving with treatment. Discussed options for inperson care if PCP office not available. Did let this patient know that I only do telemedicine on Tuesdays and Thursdays for [place]. Advised to schedule follow up visit with PCP or [facility] if any further questions or concerns to avoid delays in care.   I discussed the assessment and treatment plan with the patient. The patient was provided an opportunity to ask questions and all were answered. The patient agreed with the plan and demonstrated an understanding of the instructions.     Terressa Koyanagi, DO

## 2020-08-16 ENCOUNTER — Encounter: Payer: 59 | Admitting: Physical Therapy

## 2020-08-17 ENCOUNTER — Encounter: Payer: 59 | Admitting: Physical Therapy

## 2020-08-20 NOTE — Progress Notes (Signed)
Phone: [phone]   Subjective:  Patient presents today for their annual physical. Chief complaint-noted.   See problem oriented charting- ROS- full  review of systems was completed and negative  except for: congestion, back pain, muscle aches  The following were reviewed and entered/updated in epic: Past Medical History:  Diagnosis Date  . Anxiety    Ativan  . Chronic combined systolic and diastolic CHF, NYHA class 3 (HCC) 10/2016   Nonischemic cardiomyopathy. EF 20-25%.  . Chronic left hip pain   . Depression    history of  . Dysrhythmia    A-FIB  . GI bleed 03/2016  . Headache   . Hypertensive heart disease with combined systolic and diastolic congestive heart failure (HCC) 10/2016  . Nonischemic cardiomyopathy (HCC) 10/2016   Echo with EF 20-25%. Cardiac catheterization with no CAD. LVEDP was 41 mmHg, PCWP 36 mmHg  . Osteoarthritis    right shoulder  . Right hand fracture   . Seasonal allergies   . Sleep apnea    wears a CPAP  . Wears glasses    Patient Active Problem List   Diagnosis Date Noted  . Paroxysmal atrial fibrillation (HCC) 04/14/2020    Priority: High  . S/P ICD (internal cardiac defibrillator) procedure 04/14/2020    Priority: High  . Status post reverse total shoulder replacement, left 11/11/2019    Priority: High  . Non-ischemic cardiomyopathy (HCC)     Priority: High  . Chronic combined systolic and diastolic CHF, NYHA class 3 (HCC) 10/2016    Priority: High  . Alcohol dependence in remission (HCC) 03/19/2016    Priority: High  . GAD (generalized anxiety disorder) 05/31/2020    Priority: Medium  . Hyperlipidemia 05/27/2018    Priority: Medium  . Essential hypertension 11/08/2016    Priority: Medium  . GI bleed 03/19/2016    Priority: Medium  . ED (erectile dysfunction) 08/21/2015    Priority: Medium  . Hyperglycemia 08/21/2015    Priority: Medium  . Insomnia 08/21/2015    Priority: Medium  . Sleep apnea 08/21/2015    Priority:  Medium  . Unspecified fracture of upper end of left humerus, initial encounter for closed fracture 09/03/2017    Priority: Low  . S/P shoulder replacement 11/24/2015    Priority: Low  . Allergic rhinitis 08/21/2015    Priority: Low  . Degenerative joint disease of left hip 03/27/2015    Priority: Low   Past Surgical History:  Procedure Laterality Date  . CARDIAC CATHETERIZATION    . CARDIOVERSION N/A 09/01/2019   Procedure: CARDIOVERSION;  Surgeon: Laurey Morale, MD;  Location: [facility] ENDOSCOPY;  Service: Cardiovascular;  Laterality: N/A;  . ESOPHAGOGASTRODUODENOSCOPY (EGD) WITH PROPOFOL N/A 03/20/2016   Procedure: ESOPHAGOGASTRODUODENOSCOPY (EGD) WITH PROPOFOL;  Surgeon: Charlott Rakes, MD;  Location: [facility] ENDOSCOPY;  Service: Endoscopy;  Laterality: N/A;  . ICD IMPLANT N/A 05/06/2018   Procedure: ICD IMPLANT;  Surgeon: Thurmon Fair, MD;  Location: [facility] INVASIVE CV LAB;  Service: Cardiovascular;  Laterality: N/A;  . IRRIGATION AND DEBRIDEMENT SHOULDER Left 11/11/2019   Procedure: LEFT SHOULDER IRRIGATION AND DEBRIDEMENT WITH POLY EXCHANGE;  Surgeon: Jones Broom, MD;  Location: [facility] ORS;  Service: Orthopedics;  Laterality: Left;  . RIGHT/LEFT HEART CATH AND CORONARY ANGIOGRAPHY N/A 10/21/2016   Procedure: Right/Left Heart Cath and Coronary Angiography;  Surgeon: Runell Gess, MD;  Location: [facility] INVASIVE CV LAB;  Service: Cardiovascular: Angiographically normal coronary arteries. PCWP 33-36 mmHg, LVEDP 41 mmHg. PA pressure 60/35, mean 46 mmHg.  Cardiac output/cardiac  index-3.93 /1.76 Hiram Comber), 3.49/1.57 (thermodilution)  . TEE WITHOUT CARDIOVERSION N/A 09/01/2019   Procedure: TRANSESOPHAGEAL ECHOCARDIOGRAM (TEE);  Surgeon: Laurey Morale, MD;  Location: [facility] ENDOSCOPY;  Service: Cardiovascular;  Laterality: N/A;  . TOTAL HIP ARTHROPLASTY Left 03/27/2015   Procedure: LEFT TOTAL HIP ARTHROPLASTY ANTERIOR APPROACH;  Surgeon: Samson Frederic, MD;  Location: [facility] OR;  Service: Orthopedics;  Laterality:  Left;  . TOTAL SHOULDER ARTHROPLASTY Right 11/24/2015   Procedure: RIGHT TOTAL SHOULDER ARTHROPLASTY;  Surgeon: Beverely Low, MD;  Location: [facility] OR;  Service: Orthopedics;  Laterality: Right;  . TOTAL SHOULDER ARTHROPLASTY Left 10/19/2019   Procedure: REVERSE TOTAL SHOULDER ARTHROPLASTY;  Surgeon: Jones Broom, MD;  Location: [facility] ORS;  Service: Orthopedics;  Laterality: Left;  . TRANSTHORACIC ECHOCARDIOGRAM  10/2016   EF 20-25%. Diffuse hypokinesis but akinesis of the entire inferoseptal wall and apical wall. Moderate biatrial enlargement. PA pressure estimated 64 mmHg.  . WISDOM TOOTH EXTRACTION      Family History  Problem Relation Age of Onset  . Dementia Mother        died at 13  . Lung cancer Father        smoker  . Congenital heart disease Sister        lived to 98   . Healthy Brother   . Healthy Sister   . Prostate cancer Brother        possible cancer  . Healthy Brother   . Healthy Brother     Medications- reviewed and updated Current Outpatient Medications  Medication Sig Dispense Refill  . albuterol (PROAIR HFA) 108 (90 Base) MCG/ACT inhaler Inhale 2 puffs into the lungs every 6 (six) hours as needed for wheezing or shortness of breath. 1 each 0  . benzonatate (TESSALON PERLES) 100 MG capsule Take 1 capsule (100 mg total) by mouth 3 (three) times daily as needed. 20 capsule 0  . carvedilol (COREG) 25 MG tablet TAKE 1 TABLET (25 MG TOTAL) BY MOUTH 2 (TWO) TIMES DAILY WITH A MEAL. 60 tablet 3  . diclofenac Sodium (VOLTAREN) 1 % GEL Apply 4 g topically 4 (four) times daily. To affected joint. 100 g 11  . ELIQUIS 5 MG TABS tablet TAKE 1 TABLET BY MOUTH TWICE A DAY 180 tablet 3  . ENTRESTO 97-103 MG TAKE 1 TABLET BY MOUTH 2 (TWO) TIMES DAILY. 60 tablet 5  . escitalopram (LEXAPRO) 10 MG tablet Take 1 tablet (10 mg total) by mouth daily. 90 tablet 3  . FARXIGA 10 MG TABS tablet TAKE 1 TABLET BY MOUTH DAILY BEFORE BREAKFAST 30 tablet 11  . furosemide (LASIX) 40 MG tablet Take 1  tablet (40 mg total) by mouth every morning AND 0.5 tablets (20 mg total) every evening. 135 tablet 3  . gabapentin (NEURONTIN) 300 MG capsule Take 1 capsule (300 mg total) by mouth 3 (three) times daily as needed. 90 capsule 3  . LORazepam (ATIVAN) 1 MG tablet Take 1 tablet (1 mg total) by mouth daily as needed for anxiety (do not take ambien within 8 hours of lorazepam). 30 tablet 5  . Multiple Vitamins-Minerals (MULTIVITAMIN WITH MINERALS) tablet Take 1 tablet by mouth daily.    . rosuvastatin (CRESTOR) 20 MG tablet TAKE 1 TABLET BY MOUTH EVERY DAY 90 tablet 3  . sildenafil (VIAGRA) 100 MG tablet TAKE 0.5 TABLETS (50 MG TOTAL) BY MOUTH AT BEDTIME AS NEEDED FOR ERECTILE DYSFUNCTION. 10 tablet 3  . spironolactone (ALDACTONE) 25 MG tablet TAKE 1 TABLET BY MOUTH EVERY DAY 30 tablet 11  .  traZODone (DESYREL) 50 MG tablet TAKE 1 TABLET BY MOUTH EVERYDAY AT BEDTIME 90 tablet 1  . VASCEPA 1 g capsule TAKE 2 CAPSULES (2 G TOTAL) BY MOUTH 2 (TWO) TIMES DAILY. 120 capsule 3  . zolpidem (AMBIEN) 10 MG tablet Take 1 tablet (10 mg total) by mouth at bedtime as needed for sleep. 30 tablet 5   No current facility-administered medications for this visit.    Allergies-reviewed and updated No Known Allergies  Social History   Social History Narrative   GF that lives with him. 1 dog. Close with 3 sons that work for wife- christopher 15 at [employer], [place] 21, [place] 25 in 2021.       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    Grew up in [place].    Objective  Objective:  BP 126/60   Pulse 82   Temp 98 F (36.7 C) (Temporal)   Ht 5\' 10"  (1.778 m)   Wt 261 lb 12.8 oz (118.8 kg)   SpO2 95%   BMI 37.56 kg/m  Gen: NAD, resting comfortably HEENT: Mucous membranes are moist. Oropharynx normal Neck: no thyromegaly CV: RRR no murmurs rubs or gallops Lungs: Diffuse mild wheeze intermittently heard.  No crackles or rhonchi Abdomen:  soft/nontender/nondistended/normal bowel sounds. No rebound or guarding.  Ext: no edema Skin: warm, dry Neuro: grossly normal, moves all extremities, PERRLA    Assessment and Plan  59 y.o. male presenting for annual physical.  Health Maintenance counseling: 1. Anticipatory guidance: Patient counseled regarding regular dental exams q6 months, eye exams yearly,  avoiding smoking and second hand smoke, limiting alcohol to 0- beverages per day- a lot of family is worried about alcohol intake- he is not drinking now- friends/family have encouraged him to do detox .  Had been drinking a fair amount with family in 46. Abuse in remission at the moment but I am concerned- I think we need to get him into AA 2. Risk factor reduction:  Advised patient of need for regular exercise and diet rich and fruits and vegetables to reduce risk of heart attack and stroke. Exercise- working with [place] on exercise. Diet-weight had bene up but down 10-12 lbs form peak reacently- cutting down alcohol helps- trying to eat healthier.  Wt Readings from Last 3 Encounters:  08/22/20 261 lb 12.8 oz (118.8 kg)  07/31/20 273 lb 9.6 oz (124.1 kg)  07/24/20 267 lb 3.2 oz (121.2 kg)  3. Immunizations/screenings/ancillary studies- fully up to date Immunization History  Administered Date(s) Administered  . Influenza, Quadrivalent, Recombinant, Inj, Pf 08/05/2017  . Influenza,inj,Quad PF,6+ Mos 10/19/2016  . Influenza-Unspecified 06/14/2015, 10/19/2016, 05/19/2020  . PFIZER SARS-COV-2 Vaccination 03/30/2020, 04/30/2020, 05/19/2020  . Pneumococcal Polysaccharide-23 10/19/2016  . Td 01/29/2007  . Tdap 04/14/2020   4. Prostate cancer screening- will trend psa with labs- dont have prior #s but will monitor  No results found for: PSA1, PSA  5. Colon cancer screening - will complete cologuard as starting new job and colonoscopy would be hard 6. Skin cancer screening- sees dermatology yearly. advised regular sunscreen use. Denies  worrisome, changing, or new skin lesions.  7. Never smoker 8. STD screening - monogamous with GF  Status of chronic or acute concerns   #See separate problem-oriented note for acute nonstable concerns  #slipped on tub- gf has just cleaned and having some issues with right back pain but that isimproving.   #hyperlipidemia S: Medication: Rosuvastatin 20Mg , Vascepa Lab Results  Component  Value Date   CHOL 151 03/30/2020   HDL 48 03/30/2020   LDLCALC 24 03/30/2020   TRIG 394 (H) 03/30/2020   CHOLHDL 3.1 03/30/2020   A/P: Well-controlled LDL last visit -we will check triglycerides when he comes back for labs to see how Vascepa is influencing triglycerides  #Nonischemic cardiomyopathy -no signs of fluid overload today-in fact weight has been trending down-some of this may be from reducing alcohol #hypertension S: medication: Carvedilol 25Mg  twice daily, Lasix 40Mg  in AM and 20 mg in p.m., Spironolactone 25Mg , Entresto-medications help CHF as well as hypertension BP Readings from Last 3 Encounters:  08/22/20 126/60  07/31/20 132/74  07/24/20 108/76  A/P: Reasonable control for hypertension and CHF -continue current medication  # Atrial fibrillation S: Rate controlled with carvedilol 25 mg twice daily Anticoagulated with Eliquis 5 mg twice daily A/P: Appropriately anticoagulated and rate controlled-continue current medication  Recommended follow up: Discussed every 3 to 53-month follow-up Future Appointments  Date Time Provider Department Center  08/23/2020  8:15 AM [facility] LAB [facility]  08/23/2020  4:00 PM 6-month, PT [facility]  08/24/2020  1:00 PM 14/04/2020, PT [facility]  08/29/2020  1:45 PM 14/05/2020, PT [facility]  08/31/2020  1:45 PM 08/31/2020, PT [facility]  09/04/2020  4:15 PM 09/02/2020, MD [facility] None  10/17/2020  2:20 PM 09/06/2020, MD [facility] None  11/02/2020  7:05 AM [facility] DEVICE REMOTES [facility]   02/01/2021  7:05 AM [facility] DEVICE REMOTES [facility]  05/03/2021  7:05 AM [facility] DEVICE REMOTES [facility]  08/02/2021  7:05 AM [facility] DEVICE REMOTES [facility]  11/01/2021  7:05 AM [facility] DEVICE REMOTES [facility]  01/31/2022  7:05 AM [facility] DEVICE REMOTES [facility]   Lab/Order associations: Patient will return for fasting labs   ICD-10-CM   1. Preventative health care  Z00.00 CBC With Differential/Platelet    COMPLETE METABOLIC PANEL WITH GFR    Lipid Panel w/reflex Direct LDL    PSA  2. Essential hypertension  I10 CBC With Differential/Platelet    COMPLETE METABOLIC PANEL WITH GFR    Lipid Panel w/reflex Direct LDL  3. GAD (generalized anxiety disorder)  F41.1 Ambulatory referral to Psychiatry  4. Pure hypercholesterolemia  E78.00 CBC With Differential/Platelet    COMPLETE METABOLIC PANEL WITH GFR    Lipid Panel w/reflex Direct LDL  5. Encounter for screening colonoscopy  Z12.11   6. Chest congestion  R09.89 DG Chest 2 View  7. Chronic cough  R05.3 DG Chest 2 View  8. Screen for colon cancer  Z12.11 Cologuard  9. Screening for prostate cancer  Z12.5 PSA  10. Insomnia, unspecified type  G47.00 Ambulatory referral to Psychiatry    Return precautions advised.  11/03/2021, MD

## 2020-08-20 NOTE — Patient Instructions (Addendum)
Lets have you complete cologuard. Let us know if you haven't received this within 2 weeks  Please go to [place]  central X-ray (updated 11/11/2019) - located [address] across the street from [place] - in the basement - Hours: 8:30-5:00 PM M-F (with lunch from 12:30- 1 PM). You do NOT need an appointment.  - Please ensure you are covid symptom free before going in(No fever, chills, cough, congestion, runny nose, shortness of breath, fatigue, body aches, sore throat, headache, nausea, vomiting, diarrhea, or new loss of taste or smell. No known contacts with covid 19 or someone being tested for covid 19). Can tell them you had negative PCR test for covid 2 days ago.   Start prednisone after we get x-ray results back to make sure no pneumonia. If still having lingering issues 10 days after starting prednisone even if no pneumonia- consider antibiotic- reach out by mychart  Have to remain off alcohol Would strongly encourage considering AA- I think that would be very helpful for long term maintenance  Schedule a lab visit at the check out desk within 2 weeks (tomorrow is fine) . Return for future fasting labs meaning nothing but water after midnight please. Ok to take your medications with water.

## 2020-08-22 ENCOUNTER — Encounter: Payer: Self-pay | Admitting: Physical Therapy

## 2020-08-22 ENCOUNTER — Other Ambulatory Visit: Payer: Self-pay | Admitting: Family Medicine

## 2020-08-22 ENCOUNTER — Other Ambulatory Visit: Payer: Self-pay

## 2020-08-22 ENCOUNTER — Encounter: Payer: Self-pay | Admitting: Family Medicine

## 2020-08-22 ENCOUNTER — Ambulatory Visit (INDEPENDENT_AMBULATORY_CARE_PROVIDER_SITE_OTHER): Payer: 59 | Admitting: Family Medicine

## 2020-08-22 ENCOUNTER — Ambulatory Visit (INDEPENDENT_AMBULATORY_CARE_PROVIDER_SITE_OTHER): Payer: 59 | Admitting: Physical Therapy

## 2020-08-22 VITALS — BP 126/60 | HR 82 | Temp 98.0°F | Ht 70.0 in | Wt 261.8 lb

## 2020-08-22 DIAGNOSIS — Z0000 Encounter for general adult medical examination without abnormal findings: Secondary | ICD-10-CM

## 2020-08-22 DIAGNOSIS — F1021 Alcohol dependence, in remission: Secondary | ICD-10-CM | POA: Diagnosis not present

## 2020-08-22 DIAGNOSIS — Z0001 Encounter for general adult medical examination with abnormal findings: Secondary | ICD-10-CM

## 2020-08-22 DIAGNOSIS — M545 Low back pain, unspecified: Secondary | ICD-10-CM

## 2020-08-22 DIAGNOSIS — I5042 Chronic combined systolic (congestive) and diastolic (congestive) heart failure: Secondary | ICD-10-CM

## 2020-08-22 DIAGNOSIS — R0989 Other specified symptoms and signs involving the circulatory and respiratory systems: Secondary | ICD-10-CM

## 2020-08-22 DIAGNOSIS — Z1211 Encounter for screening for malignant neoplasm of colon: Secondary | ICD-10-CM

## 2020-08-22 DIAGNOSIS — Z125 Encounter for screening for malignant neoplasm of prostate: Secondary | ICD-10-CM

## 2020-08-22 DIAGNOSIS — G8929 Other chronic pain: Secondary | ICD-10-CM | POA: Diagnosis not present

## 2020-08-22 DIAGNOSIS — F411 Generalized anxiety disorder: Secondary | ICD-10-CM | POA: Diagnosis not present

## 2020-08-22 DIAGNOSIS — I428 Other cardiomyopathies: Secondary | ICD-10-CM

## 2020-08-22 DIAGNOSIS — G4700 Insomnia, unspecified: Secondary | ICD-10-CM

## 2020-08-22 DIAGNOSIS — I10 Essential (primary) hypertension: Secondary | ICD-10-CM

## 2020-08-22 DIAGNOSIS — E7800 Pure hypercholesterolemia, unspecified: Secondary | ICD-10-CM

## 2020-08-22 DIAGNOSIS — I480 Paroxysmal atrial fibrillation: Secondary | ICD-10-CM

## 2020-08-22 DIAGNOSIS — M25551 Pain in right hip: Secondary | ICD-10-CM | POA: Diagnosis not present

## 2020-08-22 DIAGNOSIS — R053 Chronic cough: Secondary | ICD-10-CM

## 2020-08-22 MED ORDER — PREDNISONE 20 MG PO TABS: ORAL_TABLET | ORAL | 0 refills | Status: DC

## 2020-08-22 NOTE — Telephone Encounter (Signed)
Please advise 

## 2020-08-22 NOTE — Assessment & Plan Note (Signed)
#   Anxiety #Insomnia #Alcohol dependence S:Medication: Lexapro 10Mg  not as helpful as he would like but has noted some improvement -trazodone not helpful for sleep prescribed by Dr. -Patient on Ativan 1 tablet daily as needed for anxiety.  Is supposed to be space 8 hours from lorazepam -Also of note last visit patient had reported no recent higher levels of alcohol intake with max 12/week (patient should not drink within 8 hours of lorazepam or Ambien) -Patient actually asks about increasing Ativan -Patient has not seen a therapist as requested A/P: GAD with improvement on Lexapro but not significant enough to cut down on Ativan use during the day or Ambien at night for sleep.  Patient requesting to increase Ativan.  I am very concerned about this in light of report from family friend Dr. Denyse Amass that patient was drinking more heavily just 5 days ago-previously reported max 12/week without binging but sound like drink higher level recently -Referral was placed to psychiatry to help with better control of anxiety.  I think patient is self-medicating with alcohol intermittently -I would also like their help for sleep and anxiety to hopefully move away from benzodiazepines as well as Ambien -Strongly recommended AA for patient

## 2020-08-22 NOTE — Progress Notes (Signed)
Phone [phone] In person visit   Subjective:   John [name] is a 59 y.o. year old very pleasant male patient who presents for/with See problem oriented charting  This visit occurred during the SARS-CoV-2 public health emergency.  Safety protocols were in place, including screening questions prior to the visit, additional usage of staff PPE, and extensive cleaning of exam room while observing appropriate contact time as indicated for disinfecting solutions.   Past Medical History-  Patient Active Problem List   Diagnosis Date Noted  . Paroxysmal atrial fibrillation (HCC) 04/14/2020    Priority: High  . S/P ICD (internal cardiac defibrillator) procedure 04/14/2020    Priority: High  . Status post reverse total shoulder replacement, left 11/11/2019    Priority: High  . Non-ischemic cardiomyopathy (HCC)     Priority: High  . Chronic combined systolic and diastolic CHF, NYHA class 3 (HCC) 10/2016    Priority: High  . Alcohol dependence in remission (HCC) 03/19/2016    Priority: High  . GAD (generalized anxiety disorder) 05/31/2020    Priority: Medium  . Hyperlipidemia 05/27/2018    Priority: Medium  . Essential hypertension 11/08/2016    Priority: Medium  . GI bleed 03/19/2016    Priority: Medium  . ED (erectile dysfunction) 08/21/2015    Priority: Medium  . Hyperglycemia 08/21/2015    Priority: Medium  . Insomnia 08/21/2015    Priority: Medium  . Sleep apnea 08/21/2015    Priority: Medium  . Unspecified fracture of upper end of left humerus, initial encounter for closed fracture 09/03/2017    Priority: Low  . S/P shoulder replacement 11/24/2015    Priority: Low  . Allergic rhinitis 08/21/2015    Priority: Low  . Degenerative joint disease of left hip 03/27/2015    Priority: Low    Medications- reviewed and updated Current Outpatient Medications  Medication Sig Dispense Refill  . albuterol (PROAIR HFA) 108 (90 Base) MCG/ACT inhaler Inhale 2 puffs into the  lungs every 6 (six) hours as needed for wheezing or shortness of breath. 1 each 0  . benzonatate (TESSALON PERLES) 100 MG capsule Take 1 capsule (100 mg total) by mouth 3 (three) times daily as needed. 20 capsule 0  . carvedilol (COREG) 25 MG tablet TAKE 1 TABLET (25 MG TOTAL) BY MOUTH 2 (TWO) TIMES DAILY WITH A MEAL. 60 tablet 3  . diclofenac Sodium (VOLTAREN) 1 % GEL Apply 4 g topically 4 (four) times daily. To affected joint. 100 g 11  . ELIQUIS 5 MG TABS tablet TAKE 1 TABLET BY MOUTH TWICE A DAY 180 tablet 3  . ENTRESTO 97-103 MG TAKE 1 TABLET BY MOUTH 2 (TWO) TIMES DAILY. 60 tablet 5  . escitalopram (LEXAPRO) 10 MG tablet Take 1 tablet (10 mg total) by mouth daily. 90 tablet 3  . FARXIGA 10 MG TABS tablet TAKE 1 TABLET BY MOUTH DAILY BEFORE BREAKFAST 30 tablet 11  . furosemide (LASIX) 40 MG tablet Take 1 tablet (40 mg total) by mouth every morning AND 0.5 tablets (20 mg total) every evening. 135 tablet 3  . gabapentin (NEURONTIN) 300 MG capsule Take 1 capsule (300 mg total) by mouth 3 (three) times daily as needed. 90 capsule 3  . LORazepam (ATIVAN) 1 MG tablet Take 1 tablet (1 mg total) by mouth daily as needed for anxiety (do not take ambien within 8 hours of lorazepam). 30 tablet 5  . Multiple Vitamins-Minerals (MULTIVITAMIN WITH MINERALS) tablet Take 1 tablet by mouth daily.    Marland Kitchen  rosuvastatin (CRESTOR) 20 MG tablet TAKE 1 TABLET BY MOUTH EVERY DAY 90 tablet 3  . sildenafil (VIAGRA) 100 MG tablet TAKE 0.5 TABLETS (50 MG TOTAL) BY MOUTH AT BEDTIME AS NEEDED FOR ERECTILE DYSFUNCTION. 10 tablet 3  . spironolactone (ALDACTONE) 25 MG tablet TAKE 1 TABLET BY MOUTH EVERY DAY 30 tablet 11  . traZODone (DESYREL) 50 MG tablet TAKE 1 TABLET BY MOUTH EVERYDAY AT BEDTIME 90 tablet 1  . VASCEPA 1 g capsule TAKE 2 CAPSULES (2 G TOTAL) BY MOUTH 2 (TWO) TIMES DAILY. 120 capsule 3  . zolpidem (AMBIEN) 10 MG tablet Take 1 tablet (10 mg total) by mouth at bedtime as needed for sleep. 30 tablet 5   No current  facility-administered medications for this visit.     Objective:  BP 126/60   Pulse 82   Temp 98 F (36.7 C) (Temporal)   Ht 5\' 10"  (1.778 m)   Wt 261 lb 12.8 oz (118.8 kg)   SpO2 95%   BMI 37.56 kg/m  Gen: Appears slightly winded when coming back from bathroom Nasal turbinates largely normal.  No sinus pressure reported. Diffuse intermittent mild wheeze.  No crackles or rhonchi.     Assessment and Plan  # cough/chest congestion.  S:patient is concerned he may have bronchitis.   Symptoms started 2 months ago. Symptoms started with light cough.  Worsened when he came back from after thanksgiving. Now it feels like a deeper cough. Clear phlegm. Worse in the AM. Having some wheeze. Had virtual visit- Albuterol has not helped him much. Symptoms worse in the mornings. Never had fever or night sweats. No chest pain.   Has had negative Rapid and PCR covid test within last 2 days.   No chest x-ray since this started.  A/P: Possible viral bronchitis.  Start prednisone after we get x-ray results back to make sure no pneumonia-patient agrees to go tomorrow. If still having lingering issues 10 days after starting prednisone even if no pneumonia- consider antibiotic due to possible bacterial superinfection given duration of symptoms- reach out by mychart   # Anxiety #Insomnia #Alcohol dependence S:Medication: Lexapro 10Mg  not as helpful as he would like but has noted some improvement -trazodone not helpful for sleep prescribed by Dr. 06[birth year]-1972 -Patient on Ativan 1 tablet daily as needed for anxiety.  Is supposed to be space 8 hours from lorazepam -Also of note last visit patient had reported no recent higher levels of alcohol intake with max 12/week (patient should not drink within 8 hours of lorazepam or Ambien) -Patient actually asks about increasing Ativan -Patient has not seen a therapist as requested A/P: GAD with improvement on Lexapro but not significant enough to cut down on Ativan  use during the day or Ambien at night for sleep.  Patient requesting to increase Ativan.  I am very concerned about this in light of report from family friend Dr. that patient was drinking more heavily just 5 days ago-previously reported max 12/week without binging but sound like drink higher level recently -Referral was placed to psychiatry to help with better control of anxiety.  I think patient is self-medicating with alcohol intermittently -I would also like their help for sleep and anxiety to hopefully move away from benzodiazepines as well as Ambien -Strongly recommended AA for patient  Recommended follow up: 3 to 4 months-hoping he will be plugged in with psychiatry by then Future Appointments  Date Time Provider Department Center  08/23/2020  8:15 AM [facility]  08/23/2020  4:00 PM Sedalia Muta, PT [facility]  08/24/2020  1:00 PM Sedalia Muta, PT [facility]  08/29/2020  1:45 PM Sedalia Muta, PT [facility]  08/31/2020  1:45 PM Sedalia Muta, PT [facility]  09/04/2020  4:15 PM Rodolph Bong, MD [facility] None  10/17/2020  2:20 PM Laurey Morale, MD [facility] None  11/02/2020  7:05 AM [facility] DEVICE REMOTES [facility]  02/01/2021  7:05 AM [facility] DEVICE REMOTES [facility]  05/03/2021  7:05 AM [facility] DEVICE REMOTES [facility]  08/02/2021  7:05 AM [facility] DEVICE REMOTES [facility]  11/01/2021  7:05 AM [facility] DEVICE REMOTES [facility]  01/31/2022  7:05 AM [facility] DEVICE REMOTES [facility]    Lab/Order associations:   ICD-10-CM   2. GAD (generalized anxiety disorder)  F41.1 Ambulatory referral to Psychiatry  3. Alcohol dependence in remission (HCC)  F10.21   4. Insomnia, unspecified type  G47.00 Ambulatory referral to Psychiatry  5. Chest congestion  R09.89 DG Chest 2 View  6. Chronic cough  R05.3 DG Chest 2 View    Meds ordered this encounter   Medications  . predniSONE (DELTASONE) 20 MG tablet    Sig: Take 2 pills for 3 days, 1 pill for 4 days    Dispense:  10 tablet    Refill:  0   Return precautions advised.  [name], MD

## 2020-08-23 ENCOUNTER — Encounter: Payer: Self-pay | Admitting: Physical Therapy

## 2020-08-23 ENCOUNTER — Ambulatory Visit (INDEPENDENT_AMBULATORY_CARE_PROVIDER_SITE_OTHER)
Admission: RE | Admit: 2020-08-23 | Discharge: 2020-08-23 | Disposition: A | Discharge status: Home / Self Care | Payer: 59 | Source: Ambulatory Visit | Attending: Family Medicine | Admitting: Family Medicine

## 2020-08-23 ENCOUNTER — Other Ambulatory Visit: Payer: 59

## 2020-08-23 ENCOUNTER — Encounter: Payer: 59 | Admitting: Physical Therapy

## 2020-08-23 DIAGNOSIS — R0989 Other specified symptoms and signs involving the circulatory and respiratory systems: Secondary | ICD-10-CM | POA: Diagnosis not present

## 2020-08-23 DIAGNOSIS — R053 Chronic cough: Secondary | ICD-10-CM

## 2020-08-23 NOTE — Therapy (Signed)
[facility] [address] [place], [place], [address] Phone: [phone]   Fax:  [phone]  Physical Therapy Treatment  Patient Details  Name: John [name] MRN: [id number] Date of Birth: 04-18-1961 Referring Provider (PT): [name]   Encounter Date: 08/22/2020   PT End of Session - 08/23/20 0846    Visit Number 2    Number of Visits 12    Date for PT Re-Evaluation 09/18/20    Authorization Type [employer]    PT Start Time 1346    PT Stop Time 1429    PT Time Calculation (min) 43 min    Activity Tolerance Patient tolerated treatment well    Behavior During Therapy [facility] for tasks assessed/performed           Past Medical History:  Diagnosis Date  . Anxiety    Ativan  . Chronic combined systolic and diastolic CHF, NYHA class 3 (HCC) 10/2016   Nonischemic cardiomyopathy. EF 20-25%.  . Chronic left hip pain   . Depression    history of  . Dysrhythmia    A-FIB  . GI bleed 03/2016  . Headache   . Hypertensive heart disease with combined systolic and diastolic congestive heart failure (HCC) 10/2016  . Nonischemic cardiomyopathy (HCC) 10/2016   Echo with EF 20-25%. Cardiac catheterization with no CAD. LVEDP was 41 mmHg, PCWP 36 mmHg  . Osteoarthritis    right shoulder  . Right hand fracture   . Seasonal allergies   . Sleep apnea    wears a CPAP  . Wears glasses     Past Surgical History:  Procedure Laterality Date  . CARDIAC CATHETERIZATION    . CARDIOVERSION N/A 09/01/2019   Procedure: CARDIOVERSION;  Surgeon: Laurey Morale, MD;  Location: [facility] ENDOSCOPY;  Service: Cardiovascular;  Laterality: N/A;  . ESOPHAGOGASTRODUODENOSCOPY (EGD) WITH PROPOFOL N/A 03/20/2016   Procedure: ESOPHAGOGASTRODUODENOSCOPY (EGD) WITH PROPOFOL;  Surgeon: Charlott Rakes, MD;  Location: [facility] ENDOSCOPY;  Service: Endoscopy;  Laterality: N/A;  . ICD IMPLANT N/A 05/06/2018   Procedure: ICD IMPLANT;  Surgeon: Thurmon Fair, MD;  [facility] INVASIVE CV LAB;  Service: Cardiovascular;  Laterality: N/A;  . IRRIGATION AND DEBRIDEMENT SHOULDER Left 11/11/2019   Procedure: LEFT SHOULDER IRRIGATION AND DEBRIDEMENT WITH POLY EXCHANGE;  Surgeon: Jones Broom, MD;  Location: [facility] ORS;  Service: Orthopedics;  Laterality: Left;  . RIGHT/LEFT HEART CATH AND CORONARY ANGIOGRAPHY N/A 10/21/2016   Procedure: Right/Left Heart Cath and Coronary Angiography;  Surgeon: Runell Gess, MD;  Location: [facility] INVASIVE CV LAB;  Service: Cardiovascular: Angiographically normal coronary arteries. PCWP 33-36 mmHg, LVEDP 41 mmHg. PA pressure 60/35, mean 46 mmHg.  Cardiac output/cardiac index-3.93 /1.76 (Fick), 3.49/1.57 (thermodilution)  . TEE WITHOUT CARDIOVERSION N/A 09/01/2019   Procedure: TRANSESOPHAGEAL ECHOCARDIOGRAM (TEE);  Surgeon: Laurey Morale, MD;  Location: [facility] ENDOSCOPY;  Service: Cardiovascular;  Laterality: N/A;  . TOTAL HIP ARTHROPLASTY Left 03/27/2015   Procedure: LEFT TOTAL HIP ARTHROPLASTY ANTERIOR APPROACH;  Surgeon: Samson Frederic, MD;  Location: [facility] OR;  Service: Orthopedics;  Laterality: Left;  . TOTAL SHOULDER ARTHROPLASTY Right 11/24/2015   Procedure: RIGHT TOTAL SHOULDER ARTHROPLASTY;  Surgeon: Beverely Low, MD;  Location: [facility] OR;  Service: Orthopedics;  Laterality: Right;  . TOTAL SHOULDER ARTHROPLASTY Left 10/19/2019   Procedure: REVERSE TOTAL SHOULDER ARTHROPLASTY;  Surgeon: Jones Broom, MD;  Location: [facility] ORS;  Service: Orthopedics;  Laterality: Left;  . TRANSTHORACIC ECHOCARDIOGRAM  10/2016   EF 20-25%. Diffuse hypokinesis but akinesis of the entire inferoseptal  wall and apical wall. Moderate biatrial enlargement. PA pressure estimated 64 mmHg.  . WISDOM TOOTH EXTRACTION      There were no vitals filed for this visit.   Subjective Assessment - 08/23/20 0845    Subjective Pt states slight improvment in pain after last visit. Traveled for thanksgiving, and then was sick, so hasnt done much of HEP. States pain in R low back today.     Currently in Pain? Yes    Pain Score 8     Pain Location Hip    Pain Orientation Right    Pain Descriptors / Indicators Aching    Pain Type Acute pain    Pain Onset More than a month ago    Pain Frequency Intermittent    Pain Score 8    Pain Location Back    Pain Orientation Right    Pain Descriptors / Indicators Aching    Pain Type Acute pain    Pain Onset More than a month ago    Pain Frequency Intermittent                             [facility] Adult PT Treatment/Exercise - 08/23/20 0850      Ambulation/Gait   Gait Comments Antalgic gait, limited hip flex and ext due to pain and stiffness       Exercises   Exercises Lumbar      Lumbar Exercises: Stretches   Active Hamstring Stretch 3 reps;30 seconds    Single Knee to Chest Stretch 3 reps;30 seconds    Pelvic Tilt 20 reps    Figure 4 Stretch 3 reps;20 seconds    Figure 4 Stretch Limitations supine mod fig 4;     Other Lumbar Stretch Exercise Hooklying: Hip ER stretch 10 sec x 5;       Lumbar Exercises: Aerobic   Stationary Bike L1 x 4 min       Lumbar Exercises: Supine   Clam 20 reps    Clam Limitations GTB    Bent Knee Raise 20 reps      Manual Therapy   Manual Therapy Joint mobilization;Passive ROM;Manual Traction;Soft tissue mobilization    Joint Mobilization Lumbar PAs gr 3    Soft tissue mobilization DTM/IASTM to R lumbar and Si region     Passive ROM For R hip all motions, manual hip flexor stretch(prone)     Manual Traction Long leg distraction on R, for lumbar and hip pump x 3 min;                     PT Short Term Goals - 08/23/20 0825      PT SHORT TERM GOAL #1   Title Pt to be independent with initial HEP    Time 2    Period Weeks    Status New    Target Date 08/21/20             PT Long Term Goals - 08/23/20 0825      PT LONG TERM GOAL #1   Title Pt to be independent with final HEP    Time 6    Period Weeks    Status New    Target Date 09/18/20      PT  LONG TERM GOAL #2   Title Pt to report decreased pain in R hip and back to 0-2/10 with activity    Time 6    Period Weeks  Status New    Target Date 09/18/20      PT LONG TERM GOAL #3   Title Pt to demo improved strength of R hip to at least 4+/5 for stability and ambulation    Time 6    Period Weeks    Status New    Target Date 09/18/20      PT LONG TERM GOAL #4   Title Pt to demo improved gait mechanics to be WNL for pt age    Time 6    Period Weeks    Status New    Target Date 09/18/20      PT LONG TERM GOAL #5   Title Pt to demo Lumbar ROM to be WFL ,and pain free, to improve ability for IADLS.    Time 6    Period Weeks    Status New    Target Date 09/18/20                 Plan - 08/23/20 0848    Clinical Impression Statement Reviewed ther ex for HEP and updated HEP today. Pt req mod/max cueing for ther ex today for correct performance. Pain in R lumbar/SI addressed with manual, DTM, IASTM today, as well as distraction for R hip pain. Pt to benefit from progressive ther ex and manual for pain/ mobility    Examination-Activity Limitations Bend;Squat;Stairs;Stand;Locomotion Level;Lift    Examination-Participation Restrictions Cleaning;Community Activity;Driving;Yard Work;Shop    Stability/Clinical Decision Making Stable/Uncomplicated    Rehab Potential Good    PT Frequency 2x / week    PT Duration 6 weeks    PT Treatment/Interventions ADLs/Self Care Home Management;Electrical Stimulation;Functional mobility training;Moist Heat;Iontophoresis 4mg /ml Dexamethasone;Therapeutic activities;Stair training;Gait training;Cryotherapy;Ultrasound;Traction;Therapeutic exercise;Balance training;Neuromuscular re-education;Patient/family education;Dry needling;Passive range of motion;Manual techniques;Taping;Vasopneumatic Device;Spinal Manipulations;Joint Manipulations    Consulted and Agree with Plan of Care Patient           Patient will benefit from skilled therapeutic  intervention in order to improve the following deficits and impairments:  Abnormal gait, Hypomobility, Decreased activity tolerance, Decreased strength, Pain, Increased muscle spasms, Difficulty walking, Decreased balance, Decreased mobility, Decreased range of motion, Impaired flexibility  Visit Diagnosis: Pain in right hip  Chronic right-sided low back pain without sciatica     Problem List Patient Active Problem List   Diagnosis Date Noted  . GAD (generalized anxiety disorder) 05/31/2020  . Paroxysmal atrial fibrillation (HCC) 04/14/2020  . S/P ICD (internal cardiac defibrillator) procedure 04/14/2020  . Status post reverse total shoulder replacement, left 11/11/2019  . Hyperlipidemia 05/27/2018  . Unspecified fracture of upper end of left humerus, initial encounter for closed fracture 09/03/2017  . Essential hypertension 11/08/2016  . Non-ischemic cardiomyopathy (HCC)   . Chronic combined systolic and diastolic CHF, NYHA class 3 (HCC) 10/2016  . GI bleed 03/19/2016  . Alcohol dependence in remission (HCC) 03/19/2016  . S/P shoulder replacement 11/24/2015  . Allergic rhinitis 08/21/2015  . ED (erectile dysfunction) 08/21/2015  . Hyperglycemia 08/21/2015  . Insomnia 08/21/2015  . Sleep apnea 08/21/2015  . Degenerative joint disease of left hip 03/27/2015   05/28/2015, PT, DPT 8:52 AM  08/23/20    [facility] [address] [place], [place], [place] Phone: [phone]   Fax:  [phone]  Name: John [name] MRN: [name] Date of Birth: [birth year]

## 2020-08-23 NOTE — Therapy (Signed)
[facility] [address] [place], [place], [address] Phone: [phone]   Fax:  [phone]  Physical Therapy Evaluation  Patient Details  Name: John [name] MRN: [id number] Date of Birth: [birth year] Referring Provider (PT): [name]   Encounter Date: 08/07/2020   PT End of Session - 08/22/20 1406    Visit Number 1    Number of Visits 12    Date for PT Re-Evaluation 09/18/20    Authorization Type [employer]    PT Start Time 1215    PT Stop Time 1300    PT Time Calculation (min) 45 min    Activity Tolerance Patient tolerated treatment well    Behavior During Therapy [facility] for tasks assessed/performed           Past Medical History:  Diagnosis Date  . Anxiety    Ativan  . Chronic combined systolic and diastolic CHF, NYHA class 3 (HCC) 10/2016   Nonischemic cardiomyopathy. EF 20-25%.  . Chronic left hip pain   . Depression    history of  . Dysrhythmia    A-FIB  . GI bleed 03/2016  . Headache   . Hypertensive heart disease with combined systolic and diastolic congestive heart failure (HCC) 10/2016  . Nonischemic cardiomyopathy (HCC) 10/2016   Echo with EF 20-25%. Cardiac catheterization with no CAD. LVEDP was 41 mmHg, PCWP 36 mmHg  . Osteoarthritis    right shoulder  . Right hand fracture   . Seasonal allergies   . Sleep apnea    wears a CPAP  . Wears glasses     Past Surgical History:  Procedure Laterality Date  . CARDIAC CATHETERIZATION    . CARDIOVERSION N/A 09/01/2019   Procedure: CARDIOVERSION;  Surgeon: Laurey Morale, MD;  Location: [facility] ENDOSCOPY;  Service: Cardiovascular;  Laterality: N/A;  . ESOPHAGOGASTRODUODENOSCOPY (EGD) WITH PROPOFOL N/A 03/20/2016   Procedure: ESOPHAGOGASTRODUODENOSCOPY (EGD) WITH PROPOFOL;  Surgeon: Charlott Rakes, MD;  Location: [facility] ENDOSCOPY;  Service: Endoscopy;  Laterality: N/A;  . ICD IMPLANT N/A 05/06/2018   Procedure: ICD IMPLANT;  Surgeon: Thurmon Fair, MD;   Location: [facility] INVASIVE CV LAB;  Service: Cardiovascular;  Laterality: N/A;  . IRRIGATION AND DEBRIDEMENT SHOULDER Left 11/11/2019   Procedure: LEFT SHOULDER IRRIGATION AND DEBRIDEMENT WITH POLY EXCHANGE;  Surgeon: Jones Broom, MD;  Location: [facility] ORS;  Service: Orthopedics;  Laterality: Left;  . RIGHT/LEFT HEART CATH AND CORONARY ANGIOGRAPHY N/A 10/21/2016   Procedure: Right/Left Heart Cath and Coronary Angiography;  Surgeon: Runell Gess, MD;  Location: [facility] INVASIVE CV LAB;  Service: Cardiovascular: Angiographically normal coronary arteries. PCWP 33-36 mmHg, LVEDP 41 mmHg. PA pressure 60/35, mean 46 mmHg.  Cardiac output/cardiac index-3.93 /1.76 (Fick), 3.49/1.57 (thermodilution)  . TEE WITHOUT CARDIOVERSION N/A 09/01/2019   Procedure: TRANSESOPHAGEAL ECHOCARDIOGRAM (TEE);  Surgeon: Laurey Morale, MD;  Location: [facility] ENDOSCOPY;  Service: Cardiovascular;  Laterality: N/A;  . TOTAL HIP ARTHROPLASTY Left 03/27/2015   Procedure: LEFT TOTAL HIP ARTHROPLASTY ANTERIOR APPROACH;  Surgeon: Samson Frederic, MD;  Location: [facility] OR;  Service: Orthopedics;  Laterality: Left;  . TOTAL SHOULDER ARTHROPLASTY Right 11/24/2015   Procedure: RIGHT TOTAL SHOULDER ARTHROPLASTY;  Surgeon: Beverely Low, MD;  Location: [facility] OR;  Service: Orthopedics;  Laterality: Right;  . TOTAL SHOULDER ARTHROPLASTY Left 10/19/2019   Procedure: REVERSE TOTAL SHOULDER ARTHROPLASTY;  Surgeon: Jones Broom, MD;  Location: [facility] ORS;  Service: Orthopedics;  Laterality: Left;  . TRANSTHORACIC ECHOCARDIOGRAM  10/2016   EF 20-25%. Diffuse hypokinesis but akinesis of the entire inferoseptal  wall and apical wall. Moderate biatrial enlargement. PA pressure estimated 64 mmHg.  . WISDOM TOOTH EXTRACTION      There were no vitals filed for this visit.    Subjective Assessment - 08/22/20 1405    Subjective Pt states worsening pain in R hip. Had recent injection that did not help much. Has had previous L HTA with good outcome. Also states pain in R low  back/SI. Most pain in groin in R hip. Changed jobs, now working but not driving.    Patient Stated Goals Decreased pain in hip/back    Currently in Pain? Yes    Pain Score 8     Pain Location Hip    Pain Orientation Right    Pain Descriptors / Indicators Aching    Pain Type Acute pain    Pain Onset More than a month ago    Pain Frequency Intermittent    Aggravating Factors  increased activity, standing    Multiple Pain Sites Yes    Pain Score 8    Pain Location Back    Pain Orientation Right    Pain Descriptors / Indicators Aching    Pain Type Acute pain    Pain Onset More than a month ago    Pain Frequency Intermittent              [facility] PT Assessment - 08/23/20 0001      Assessment   Medical Diagnosis R hip pain    Referring Provider (PT) [name]    Prior Therapy no      Balance Screen   Has the patient fallen in the past 6 months No      Prior Function   Level of Independence Independent      Cognition   Overall Cognitive Status Within Functional Limits for tasks assessed      ROM / Strength   AROM / PROM / Strength AROM;Strength      AROM   Overall AROM Comments R hip: mild/mod limitatoin for flexion, Mod/significant limitation for rotation and ext;   Lumbar: mod limiation for all motions.       Strength   Overall Strength Comments R hip: 4-/5,  L: 4+/5       Palpation   Palpation comment Hypomobile R hip joint;  Hypomobile lumbar spine with PAs;  Pain in R SI and R L3-5;  Soreness in R anteiror hip/groin.       Ambulation/Gait   Gait Comments Antalgic gait, limited hip flex and ext due to pain and stiffness                       Objective measurements completed on examination: See above findings.       [facility] Adult PT Treatment/Exercise - 08/23/20 0001      Exercises   Exercises Lumbar      Lumbar Exercises: Stretches   Active Hamstring Stretch 3 reps;30 seconds    Single Knee to Chest Stretch 3 reps;30 seconds    Other Lumbar  Stretch Exercise Hooklying: Hip ER stretch 10 sec x 5;       Lumbar Exercises: Supine   Clam 20 reps    Clam Limitations GTB    Bent Knee Raise 20 reps                  PT Education - 08/23/20 4627    Education Details PT POC, Exam findings, HEP    Person(s) Educated Patient  Methods Explanation;Demonstration;Verbal cues;Handout;Tactile cues    Comprehension Verbalized understanding;Returned demonstration;Verbal cues required;Tactile cues required;Need further instruction            PT Short Term Goals - 08/23/20 0825      PT SHORT TERM GOAL #1   Title Pt to be independent with initial HEP    Time 2    Period Weeks    Status New    Target Date 08/21/20             PT Long Term Goals - 08/23/20 0825      PT LONG TERM GOAL #1   Title Pt to be independent with final HEP    Time 6    Period Weeks    Status New    Target Date 09/18/20      PT LONG TERM GOAL #2   Title Pt to report decreased pain in R hip and back to 0-2/10 with activity    Time 6    Period Weeks    Status New    Target Date 09/18/20      PT LONG TERM GOAL #3   Title Pt to demo improved strength of R hip to at least 4+/5 for stability and ambulation    Time 6    Period Weeks    Status New    Target Date 09/18/20      PT LONG TERM GOAL #4   Title Pt to demo improved gait mechanics to be WNL for pt age    Time 6    Period Weeks    Status New    Target Date 09/18/20      PT LONG TERM GOAL #5   Title Pt to demo Lumbar ROM to be WFL ,and pain free, to improve ability for IADLS.    Time 6    Period Weeks    Status New    Target Date 09/18/20                  Plan - 08/23/20 0829    Clinical Impression Statement Pt presents with primary complaint of increased pain in R hip and R low back. He has significant stiffness and lack of ROM in R hip. Recent x-rays show OA, symptoms consistent with this. He also has pain in R SI, R low lumbar region, with mild lack of lumbar ROM.  He has weakness in hip and core muscles, and is overall deconditioned. Pt with antalgic gait, and decreased ability for full functional activities due to pain and dysfunction. Pt to benefit from skilled PT to improve defiicts and pain    Examination-Activity Limitations Bend;Squat;Stairs;Stand;Locomotion Level;Lift    Examination-Participation Restrictions Cleaning;Community Activity;Driving;Yard Work;Shop    Stability/Clinical Decision Making Stable/Uncomplicated    Clinical Decision Making Low    Rehab Potential Good    PT Frequency 2x / week    PT Duration 6 weeks    PT Treatment/Interventions ADLs/Self Care Home Management;Electrical Stimulation;Functional mobility training;Moist Heat;Iontophoresis 4mg /ml Dexamethasone;Therapeutic activities;Stair training;Gait training;Cryotherapy;Ultrasound;Traction;Therapeutic exercise;Balance training;Neuromuscular re-education;Patient/family education;Dry needling;Passive range of motion;Manual techniques;Taping;Vasopneumatic Device;Spinal Manipulations;Joint Manipulations    Consulted and Agree with Plan of Care Patient           Patient will benefit from skilled therapeutic intervention in order to improve the following deficits and impairments:  Abnormal gait, Hypomobility, Decreased activity tolerance, Decreased strength, Pain, Increased muscle spasms, Difficulty walking, Decreased balance, Decreased mobility, Decreased range of motion, Impaired flexibility  Visit Diagnosis: Pain in right hip  Chronic right-sided  low back pain without sciatica     Problem List Patient Active Problem List   Diagnosis Date Noted  . GAD (generalized anxiety disorder) 05/31/2020  . Paroxysmal atrial fibrillation (HCC) 04/14/2020  . S/P ICD (internal cardiac defibrillator) procedure 04/14/2020  . Status post reverse total shoulder replacement, left 11/11/2019  . Hyperlipidemia 05/27/2018  . Unspecified fracture of upper end of left humerus, initial encounter  for closed fracture 09/03/2017  . Essential hypertension 11/08/2016  . Non-ischemic cardiomyopathy (HCC)   . Chronic combined systolic and diastolic CHF, NYHA class 3 (HCC) 10/2016  . GI bleed 03/19/2016  . Alcohol dependence in remission (HCC) 03/19/2016  . S/P shoulder replacement 11/24/2015  . Allergic rhinitis 08/21/2015  . ED (erectile dysfunction) 08/21/2015  . Hyperglycemia 08/21/2015  . Insomnia 08/21/2015  . Sleep apnea 08/21/2015  . Degenerative joint disease of left hip 03/27/2015   Sedalia Muta, PT, DPT 8:43 AM  08/23/20    [facility] [place] [facility] [address] [place], [place], [address] Phone: [phone]   Fax:  [phone]  Name: John [name] MRN: [id number] Date of Birth: 13-Jan-1961

## 2020-08-24 ENCOUNTER — Encounter: Payer: Self-pay | Admitting: Physical Therapy

## 2020-08-24 ENCOUNTER — Other Ambulatory Visit: Payer: 59

## 2020-08-24 ENCOUNTER — Ambulatory Visit (INDEPENDENT_AMBULATORY_CARE_PROVIDER_SITE_OTHER): Payer: 59 | Admitting: Physical Therapy

## 2020-08-24 ENCOUNTER — Other Ambulatory Visit: Payer: Self-pay

## 2020-08-24 DIAGNOSIS — Z0001 Encounter for general adult medical examination with abnormal findings: Secondary | ICD-10-CM

## 2020-08-24 DIAGNOSIS — Z125 Encounter for screening for malignant neoplasm of prostate: Secondary | ICD-10-CM

## 2020-08-24 DIAGNOSIS — G8929 Other chronic pain: Secondary | ICD-10-CM

## 2020-08-24 DIAGNOSIS — E7800 Pure hypercholesterolemia, unspecified: Secondary | ICD-10-CM

## 2020-08-24 DIAGNOSIS — I10 Essential (primary) hypertension: Secondary | ICD-10-CM

## 2020-08-24 DIAGNOSIS — M25551 Pain in right hip: Secondary | ICD-10-CM | POA: Diagnosis not present

## 2020-08-24 DIAGNOSIS — M545 Low back pain, unspecified: Secondary | ICD-10-CM | POA: Diagnosis not present

## 2020-08-25 LAB — LIPID PANEL W/REFLEX DIRECT LDL
Cholesterol: 153 mg/dL (ref <200)
HDL: 58 mg/dL (ref >=40)
LDL Cholesterol (Calc): 71 mg/dL (calc)
Non-HDL Cholesterol (Calc): 95 mg/dL (calc) (ref <130)
Total CHOL/HDL Ratio: 2.6 (calc) (ref <5.0)
Triglycerides: 163 mg/dL — ABNORMAL HIGH (ref <150)

## 2020-08-25 LAB — CBC WITH DIFFERENTIAL/PLATELET
Absolute Monocytes: 705 cells/uL (ref 200–950)
Basophils Absolute: 60 cells/uL (ref 0–200)
Basophils Relative: 0.8 %
Eosinophils Absolute: 68 cells/uL (ref 15–500)
Eosinophils Relative: 0.9 %
HCT: 41.9 % (ref 38.5–50.0)
Hemoglobin: 14.1 g/dL (ref 13.2–17.1)
Lymphs Abs: 1755 cells/uL (ref 850–3900)
MCH: 34.2 pg — ABNORMAL HIGH (ref 27.0–33.0)
MCHC: 33.7 g/dL (ref 32.0–36.0)
MCV: 101.7 fL — ABNORMAL HIGH (ref 80.0–100.0)
MPV: 9.8 fL (ref 7.5–12.5)
Monocytes Relative: 9.4 %
Neutro Abs: 4913 cells/uL (ref 1500–7800)
Neutrophils Relative %: 65.5 %
Platelets: 290 10*3/uL (ref 140–400)
RBC: 4.12 10*6/uL — ABNORMAL LOW (ref 4.20–5.80)
RDW: 12.6 % (ref 11.0–15.0)
Total Lymphocyte: 23.4 %
WBC: 7.5 10*3/uL (ref 3.8–10.8)

## 2020-08-25 LAB — COMPLETE METABOLIC PANEL WITH GFR
AG Ratio: 1.8 (calc) (ref 1.0–2.5)
ALT: 23 U/L (ref 9–46)
AST: 24 U/L (ref 10–35)
Albumin: 4.2 g/dL (ref 3.6–5.1)
Alkaline phosphatase (APISO): 62 U/L (ref 35–144)
BUN: 9 mg/dL (ref 7–25)
CO2: 26 mmol/L (ref 20–32)
Calcium: 9.2 mg/dL (ref 8.6–10.3)
Chloride: 104 mmol/L (ref 98–110)
Creat: 1 mg/dL (ref 0.70–1.33)
GFR, Est African American: 95 mL/min/{1.73_m2} (ref >=60)
GFR, Est Non African American: 82 mL/min/{1.73_m2} (ref >=60)
Globulin: 2.4 g/dL (calc) (ref 1.9–3.7)
Glucose, Bld: 102 mg/dL — ABNORMAL HIGH (ref 65–99)
Potassium: 4.6 mmol/L (ref 3.5–5.3)
Sodium: 139 mmol/L (ref 135–146)
Total Bilirubin: 0.4 mg/dL (ref 0.2–1.2)
Total Protein: 6.6 g/dL (ref 6.1–8.1)

## 2020-08-25 LAB — PSA: PSA: 3.4 ng/mL (ref <=4.0)

## 2020-08-27 ENCOUNTER — Other Ambulatory Visit: Payer: Self-pay | Admitting: Family Medicine

## 2020-08-27 ENCOUNTER — Encounter: Payer: Self-pay | Admitting: Physical Therapy

## 2020-08-27 NOTE — Therapy (Signed)
[facility] [address] [place], [place], [address] Phone: [phone]   Fax:  [phone]  Physical Therapy Treatment  Patient Details  Name: John [name] MRN: [id number] Date of Birth: 1961/09/12 Referring Provider (PT): [name]   Encounter Date: 08/24/2020   PT End of Session - 08/27/20 1531    Visit Number 3    Number of Visits 12    Date for PT Re-Evaluation 09/18/20    Authorization Type [employer]    PT Start Time 1300    PT Stop Time 1345    PT Time Calculation (min) 45 min    Activity Tolerance Patient tolerated treatment well    Behavior During Therapy [facility] for tasks assessed/performed           Past Medical History:  Diagnosis Date  . Anxiety    Ativan  . Chronic combined systolic and diastolic CHF, NYHA class 3 (HCC) 10/2016   Nonischemic cardiomyopathy. EF 20-25%.  . Chronic left hip pain   . Depression    history of  . Dysrhythmia    A-FIB  . GI bleed 03/2016  . Headache   . Hypertensive heart disease with combined systolic and diastolic congestive heart failure (HCC) 10/2016  . Nonischemic cardiomyopathy (HCC) 10/2016   Echo with EF 20-25%. Cardiac catheterization with no CAD. LVEDP was 41 mmHg, PCWP 36 mmHg  . Osteoarthritis    right shoulder  . Right hand fracture   . Seasonal allergies   . Sleep apnea    wears a CPAP  . Wears glasses     Past Surgical History:  Procedure Laterality Date  . CARDIAC CATHETERIZATION    . CARDIOVERSION N/A 09/01/2019   Procedure: CARDIOVERSION;  Surgeon: Laurey Morale, MD;  Location: [facility] ENDOSCOPY;  Service: Cardiovascular;  Laterality: N/A;  . ESOPHAGOGASTRODUODENOSCOPY (EGD) WITH PROPOFOL N/A 03/20/2016   Procedure: ESOPHAGOGASTRODUODENOSCOPY (EGD) WITH PROPOFOL;  Surgeon: Charlott Rakes, MD;  Location: [facility] ENDOSCOPY;  Service: Endoscopy;  Laterality: N/A;  . ICD IMPLANT N/A 05/06/2018   Procedure: ICD IMPLANT;  Surgeon: Thurmon Fair, MD;  [facility] INVASIVE CV LAB;  Service: Cardiovascular;  Laterality: N/A;  . IRRIGATION AND DEBRIDEMENT SHOULDER Left 11/11/2019   Procedure: LEFT SHOULDER IRRIGATION AND DEBRIDEMENT WITH POLY EXCHANGE;  Surgeon: Jones Broom, MD;  Location: [facility] ORS;  Service: Orthopedics;  Laterality: Left;  . RIGHT/LEFT HEART CATH AND CORONARY ANGIOGRAPHY N/A 10/21/2016   Procedure: Right/Left Heart Cath and Coronary Angiography;  Surgeon: Runell Gess, MD;  Location: [facility] INVASIVE CV LAB;  Service: Cardiovascular: Angiographically normal coronary arteries. PCWP 33-36 mmHg, LVEDP 41 mmHg. PA pressure 60/35, mean 46 mmHg.  Cardiac output/cardiac index-3.93 /1.76 (Fick), 3.49/1.57 (thermodilution)  . TEE WITHOUT CARDIOVERSION N/A 09/01/2019   Procedure: TRANSESOPHAGEAL ECHOCARDIOGRAM (TEE);  Surgeon: Laurey Morale, MD;  Location: [facility] ENDOSCOPY;  Service: Cardiovascular;  Laterality: N/A;  . TOTAL HIP ARTHROPLASTY Left 03/27/2015   Procedure: LEFT TOTAL HIP ARTHROPLASTY ANTERIOR APPROACH;  Surgeon: Samson Frederic, MD;  Location: [facility] OR;  Service: Orthopedics;  Laterality: Left;  . TOTAL SHOULDER ARTHROPLASTY Right 11/24/2015   Procedure: RIGHT TOTAL SHOULDER ARTHROPLASTY;  Surgeon: Beverely Low, MD;  Location: [facility] OR;  Service: Orthopedics;  Laterality: Right;  . TOTAL SHOULDER ARTHROPLASTY Left 10/19/2019   Procedure: REVERSE TOTAL SHOULDER ARTHROPLASTY;  Surgeon: Jones Broom, MD;  Location: [facility] ORS;  Service: Orthopedics;  Laterality: Left;  . TRANSTHORACIC ECHOCARDIOGRAM  10/2016   EF 20-25%. Diffuse hypokinesis but akinesis of the entire inferoseptal  wall and apical wall. Moderate biatrial enlargement. PA pressure estimated 64 mmHg.  . WISDOM TOOTH EXTRACTION      There were no vitals filed for this visit.   Subjective Assessment - 08/27/20 1530    Subjective Pt states soreness in R SI region , reports doing HEP.    Currently in Pain? Yes    Pain Score 6     Pain Location Hip    Pain Orientation Right    Pain  Descriptors / Indicators Aching    Pain Type Acute pain    Pain Onset More than a month ago    Pain Frequency Intermittent    Pain Score 6    Pain Location Back    Pain Orientation Right    Pain Descriptors / Indicators Aching    Pain Type Acute pain    Pain Onset More than a month ago    Pain Frequency Intermittent                             [facility] Adult PT Treatment/Exercise - 08/27/20 0001      Ambulation/Gait   Gait Comments Antalgic gait, limited hip flex and ext due to pain and stiffness       Exercises   Exercises Lumbar      Lumbar Exercises: Stretches   Active Hamstring Stretch 3 reps;30 seconds    Single Knee to Chest Stretch 3 reps;30 seconds    Pelvic Tilt 20 reps    Figure 4 Stretch 3 reps;20 seconds    Figure 4 Stretch Limitations supine mod fig 4;       Lumbar Exercises: Aerobic   Recumbent Bike L1 x3  min (pt states hip pain)      Lumbar Exercises: Standing   Other Standing Lumbar Exercises Standing March, hip abd x 20 eac bil;      Lumbar Exercises: Supine   Clam 20 reps    Clam Limitations GTB    Bent Knee Raise 20 reps      Manual Therapy   Manual Therapy Joint mobilization;Passive ROM;Manual Traction;Soft tissue mobilization    Joint Mobilization Lumbar PAs gr 3; INf and post R hip mobs gr 3;    Soft tissue mobilization DTM/IASTM to R lumbar and Si region     Passive ROM For R hip all motions, manual hip flexor stretch(prone)     Manual Traction Long leg distraction on R, for lumbar and hip pump x 3 min;                   PT Education - 08/27/20 1530    Education Details Reviewed HEP    Person(s) Educated Patient    Methods Explanation;Demonstration;Verbal cues;Tactile cues    Comprehension Verbalized understanding;Returned demonstration;Verbal cues required;Tactile cues required;Need further instruction            PT Short Term Goals - 08/23/20 0825      PT SHORT TERM GOAL #1   Title Pt to be independent with  initial HEP    Time 2    Period Weeks    Status New    Target Date 08/21/20             PT Long Term Goals - 08/23/20 0825      PT LONG TERM GOAL #1   Title Pt to be independent with final HEP    Time 6    Period Weeks  Status New    Target Date 09/18/20      PT LONG TERM GOAL #2   Title Pt to report decreased pain in R hip and back to 0-2/10 with activity    Time 6    Period Weeks    Status New    Target Date 09/18/20      PT LONG TERM GOAL #3   Title Pt to demo improved strength of R hip to at least 4+/5 for stability and ambulation    Time 6    Period Weeks    Status New    Target Date 09/18/20      PT LONG TERM GOAL #4   Title Pt to demo improved gait mechanics to be WNL for pt age    Time 6    Period Weeks    Status New    Target Date 09/18/20      PT LONG TERM GOAL #5   Title Pt to demo Lumbar ROM to be WFL ,and pain free, to improve ability for IADLS.    Time 6    Period Weeks    Status New    Target Date 09/18/20                 Plan - 08/27/20 1532    Clinical Impression Statement Continued ther ex for ROM and strength, and manual for pain and ROM. Pt with improved pain and stiffness at end of stessions. Reviewed importance of frequent ROM for HEP. Pt states back pain very bothersome. Expect that R hip is main culprit of pain due to stiffness and gait deficits, also likley increasing back pain.    Examination-Activity Limitations Bend;Squat;Stairs;Stand;Locomotion Level;Lift    Examination-Participation Restrictions Cleaning;Community Activity;Driving;Yard Work;Shop    Stability/Clinical Decision Making Stable/Uncomplicated    Rehab Potential Good    PT Frequency 2x / week    PT Duration 6 weeks    PT Treatment/Interventions ADLs/Self Care Home Management;Electrical Stimulation;Functional mobility training;Moist Heat;Iontophoresis 4mg /ml Dexamethasone;Therapeutic activities;Stair training;Gait  training;Cryotherapy;Ultrasound;Traction;Therapeutic exercise;Balance training;Neuromuscular re-education;Patient/family education;Dry needling;Passive range of motion;Manual techniques;Taping;Vasopneumatic Device;Spinal Manipulations;Joint Manipulations    Consulted and Agree with Plan of Care Patient           Patient will benefit from skilled therapeutic intervention in order to improve the following deficits and impairments:  Abnormal gait,Hypomobility,Decreased activity tolerance,Decreased strength,Pain,Increased muscle spasms,Difficulty walking,Decreased balance,Decreased mobility,Decreased range of motion,Impaired flexibility  Visit Diagnosis: Pain in right hip  Chronic right-sided low back pain without sciatica     Problem List Patient Active Problem List   Diagnosis Date Noted  . GAD (generalized anxiety disorder) 05/31/2020  . Paroxysmal atrial fibrillation (HCC) 04/14/2020  . S/P ICD (internal cardiac defibrillator) procedure 04/14/2020  . Status post reverse total shoulder replacement, left 11/11/2019  . Hyperlipidemia 05/27/2018  . Unspecified fracture of upper end of left humerus, initial encounter for closed fracture 09/03/2017  . Essential hypertension 11/08/2016  . Non-ischemic cardiomyopathy (HCC)   . Chronic combined systolic and diastolic CHF, NYHA class 3 (HCC) 10/2016  . GI bleed 03/19/2016  . Alcohol dependence in remission (HCC) 03/19/2016  . S/P shoulder replacement 11/24/2015  . Allergic rhinitis 08/21/2015  . ED (erectile dysfunction) 08/21/2015  . Hyperglycemia 08/21/2015  . Insomnia 08/21/2015  . Sleep apnea 08/21/2015  . Degenerative joint disease of left hip 03/27/2015    05/28/2015, PT, DPT 3:36 PM  08/27/20    [facility] [address] [place], [place], [place] Phone: [phone]  Fax:  [phone]  Name: John [name] MRN: [id number] Date of Birth: 02-Dec-1960

## 2020-08-28 NOTE — Telephone Encounter (Signed)
Last OV 08/22/20 Last refill 08/15/20 #1/0 Next OV not scheduled

## 2020-08-28 NOTE — Telephone Encounter (Signed)
Prescribed by Dr. Selena Batten, forwarding to Dr. Durene Cal (PCP).

## 2020-08-29 ENCOUNTER — Encounter: Payer: Self-pay | Admitting: Physical Therapy

## 2020-08-29 ENCOUNTER — Ambulatory Visit (INDEPENDENT_AMBULATORY_CARE_PROVIDER_SITE_OTHER): Payer: 59 | Admitting: Physical Therapy

## 2020-08-29 DIAGNOSIS — G8929 Other chronic pain: Secondary | ICD-10-CM | POA: Diagnosis not present

## 2020-08-29 DIAGNOSIS — M25551 Pain in right hip: Secondary | ICD-10-CM | POA: Diagnosis not present

## 2020-08-29 DIAGNOSIS — M545 Low back pain, unspecified: Secondary | ICD-10-CM | POA: Diagnosis not present

## 2020-08-29 NOTE — Therapy (Addendum)
[facility] [address] [place], [place], [address] Phone: [phone]   Fax:  [phone]  Physical Therapy Treatment  Patient Details  Name: John [name] MRN: [id number] Date of Birth: 15-Aug-1961 Referring Provider (PT): [name]   Encounter Date: 08/29/2020   PT End of Session - 08/29/20 1356     Visit Number 4    Number of Visits 12    Date for PT Re-Evaluation 09/18/20    Authorization Type [employer]    PT Start Time 1347    PT Stop Time 1428    PT Time Calculation (min) 41 min    Activity Tolerance Patient tolerated treatment well    Behavior During Therapy [facility] for tasks assessed/performed             Past Medical History:  Diagnosis Date   Anxiety    Ativan   Chronic combined systolic and diastolic CHF, NYHA class 3 (HCC) 10/2016   Nonischemic cardiomyopathy. EF 20-25%.   Chronic left hip pain    Depression    history of   Dysrhythmia    A-FIB   GI bleed 03/2016   Headache    Hypertensive heart disease with combined systolic and diastolic congestive heart failure (HCC) 10/2016   Nonischemic cardiomyopathy (HCC) 10/2016   Echo with EF 20-25%. Cardiac catheterization with no CAD. LVEDP was 41 mmHg, PCWP 36 mmHg   Osteoarthritis    right shoulder   Right hand fracture    Seasonal allergies    Sleep apnea    wears a CPAP   Wears glasses     Past Surgical History:  Procedure Laterality Date   CARDIAC CATHETERIZATION     CARDIOVERSION N/A 09/01/2019   Procedure: CARDIOVERSION;  Surgeon: Laurey Morale, MD;  Location: [facility] ENDOSCOPY;  Service: Cardiovascular;  Laterality: N/A;   ESOPHAGOGASTRODUODENOSCOPY (EGD) WITH PROPOFOL N/A 03/20/2016   Procedure: ESOPHAGOGASTRODUODENOSCOPY (EGD) WITH PROPOFOL;  Surgeon: Charlott Rakes, MD;  Location: [facility] ENDOSCOPY;  Service: Endoscopy;  Laterality: N/A;   ICD IMPLANT N/A 05/06/2018   Procedure: ICD IMPLANT;  Surgeon: Thurmon Fair, MD;  Location: [facility] INVASIVE  CV LAB;  Service: Cardiovascular;  Laterality: N/A;   IRRIGATION AND DEBRIDEMENT SHOULDER Left 11/11/2019   Procedure: LEFT SHOULDER IRRIGATION AND DEBRIDEMENT WITH POLY EXCHANGE;  Surgeon: Jones Broom, MD;  Location: [facility] ORS;  Service: Orthopedics;  Laterality: Left;   RIGHT/LEFT HEART CATH AND CORONARY ANGIOGRAPHY N/A 10/21/2016   Procedure: Right/Left Heart Cath and Coronary Angiography;  Surgeon: Runell Gess, MD;  Location: [facility] INVASIVE CV LAB;  Service: Cardiovascular: Angiographically normal coronary arteries. PCWP 33-36 mmHg, LVEDP 41 mmHg. PA pressure 60/35, mean 46 mmHg.  Cardiac output/cardiac index-3.93 /1.76 Hiram Comber), 3.49/1.57 (thermodilution)   TEE WITHOUT CARDIOVERSION N/A 09/01/2019   Procedure: TRANSESOPHAGEAL ECHOCARDIOGRAM (TEE);  Surgeon: Laurey Morale, MD;  Location: [facility] ENDOSCOPY;  Service: Cardiovascular;  Laterality: N/A;   TOTAL HIP ARTHROPLASTY Left 03/27/2015   Procedure: LEFT TOTAL HIP ARTHROPLASTY ANTERIOR APPROACH;  Surgeon: Samson Frederic, MD;  Location: [facility] OR;  Service: Orthopedics;  Laterality: Left;   TOTAL SHOULDER ARTHROPLASTY Right 11/24/2015   Procedure: RIGHT TOTAL SHOULDER ARTHROPLASTY;  Surgeon: Beverely Low, MD;  Location: [facility] OR;  Service: Orthopedics;  Laterality: Right;   TOTAL SHOULDER ARTHROPLASTY Left 10/19/2019   Procedure: REVERSE TOTAL SHOULDER ARTHROPLASTY;  Surgeon: Jones Broom, MD;  Location: [facility] ORS;  Service: Orthopedics;  Laterality: Left;   TRANSTHORACIC ECHOCARDIOGRAM  10/2016   EF 20-25%. Diffuse hypokinesis but akinesis of  the entire inferoseptal wall and apical wall. Moderate biatrial enlargement. PA pressure estimated 64 mmHg.   WISDOM TOOTH EXTRACTION      There were no vitals filed for this visit.   Subjective Assessment - 08/29/20 1354     Subjective Pt states pain in hip, less in back today.                               [facility] Adult PT Treatment/Exercise - 08/29/20 0001       Ambulation/Gait    Gait Comments Antalgic gait, limited hip flex and ext due to pain and stiffness       Exercises   Exercises Lumbar      Lumbar Exercises: Stretches   Active Hamstring Stretch 3 reps;30 seconds    Single Knee to Chest Stretch 3 reps;30 seconds    Pelvic Tilt 20 reps    Figure 4 Stretch 3 reps;20 seconds    Figure 4 Stretch Limitations supine mod fig 4;       Lumbar Exercises: Aerobic   Recumbent Bike L1 x 5      Lumbar Exercises: Standing   Other Standing Lumbar Exercises Standing March, hip abd x 15 eac bil;      Lumbar Exercises: Supine   Clam 20 reps    Clam Limitations GTB    Bent Knee Raise 20 reps    Bridge 20 reps      Lumbar Exercises: Prone   Other Prone Lumbar Exercises Prone hip ext x 15 bil      Manual Therapy   Manual Therapy Joint mobilization;Passive ROM;Manual Traction;Soft tissue mobilization    Joint Mobilization Lumbar PAs gr 3; INf and post R hip mobs gr 3;    Soft tissue mobilization DTM/IASTM to R lumbar and Si region     Passive ROM For R hip all motions, manual hip flexor stretch(prone)     Manual Traction Long leg distraction on R, for lumbar and hip pump x 3 min;                       PT Short Term Goals - 08/23/20 0825       PT SHORT TERM GOAL #1   Title Pt to be independent with initial HEP    Time 2    Period Weeks    Status New    Target Date 08/21/20               PT Long Term Goals - 08/23/20 0825       PT LONG TERM GOAL #1   Title Pt to be independent with final HEP    Time 6    Period Weeks    Status New    Target Date 09/18/20      PT LONG TERM GOAL #2   Title Pt to report decreased pain in R hip and back to 0-2/10 with activity    Time 6    Period Weeks    Status New    Target Date 09/18/20      PT LONG TERM GOAL #3   Title Pt to demo improved strength of R hip to at least 4+/5 for stability and ambulation    Time 6    Period Weeks    Status New    Target Date 09/18/20      PT LONG TERM GOAL #4    Title Pt to  demo improved gait mechanics to be WNL for pt age    Time 6    Period Weeks    Status New    Target Date 09/18/20      PT LONG TERM GOAL #5   Title Pt to demo Lumbar ROM to be WFL ,and pain free, to improve ability for IADLS.    Time 6    Period Weeks    Status New    Target Date 09/18/20                   Plan - 08/29/20 1429     Clinical Impression Statement Pt with mild improvment of pain in R low back. Focus on hip ROM and strength today. Pt continues to have mild gait deficits due to stiffness in hip. Plan to progress as tolerated.    Examination-Activity Limitations Bend;Squat;Stairs;Stand;Locomotion Level;Lift    Examination-Participation Restrictions Cleaning;Community Activity;Driving;Yard Work;Shop    Stability/Clinical Decision Making Stable/Uncomplicated    Rehab Potential Good    PT Frequency 2x / week    PT Duration 6 weeks    PT Treatment/Interventions ADLs/Self Care Home Management;Electrical Stimulation;Functional mobility training;Moist Heat;Iontophoresis 4mg /ml Dexamethasone;Therapeutic activities;Stair training;Gait training;Cryotherapy;Ultrasound;Traction;Therapeutic exercise;Balance training;Neuromuscular re-education;Patient/family education;Dry needling;Passive range of motion;Manual techniques;Taping;Vasopneumatic Device;Spinal Manipulations;Joint Manipulations    Consulted and Agree with Plan of Care Patient             Patient will benefit from skilled therapeutic intervention in order to improve the following deficits and impairments:  Abnormal gait,Hypomobility,Decreased activity tolerance,Decreased strength,Pain,Increased muscle spasms,Difficulty walking,Decreased balance,Decreased mobility,Decreased range of motion,Impaired flexibility  Visit Diagnosis: Pain in right hip  Chronic right-sided low back pain without sciatica     Problem List Patient Active Problem List   Diagnosis Date Noted   GAD (generalized anxiety  disorder) 05/31/2020   Paroxysmal atrial fibrillation ([place]) 04/14/2020   S/P ICD (internal cardiac defibrillator) procedure 04/14/2020   Status post reverse total shoulder replacement, left 11/11/2019   Hyperlipidemia 05/27/2018   Unspecified fracture of upper end of left humerus, initial encounter for closed fracture 09/03/2017   Essential hypertension 11/08/2016   Non-ischemic cardiomyopathy ([place])    Chronic combined systolic and diastolic CHF, NYHA class 3 ([place]) 10/2016   GI bleed 03/19/2016   Alcohol dependence in remission ([place]) 03/19/2016   S/P shoulder replacement 11/24/2015   Allergic rhinitis 08/21/2015   ED (erectile dysfunction) 08/21/2015   Hyperglycemia 08/21/2015   Insomnia 08/21/2015   Sleep apnea 08/21/2015   Degenerative joint disease of left hip 03/27/2015   Lyndee Hensen, PT, DPT 2:30 PM  08/29/20    [facility] [address] [place], [place], [address] Phone: [phone]   Fax:  [phone]  Name: Alfio Loescher MRN: [id number] Date of Birth: 10-23-1960  PHYSICAL THERAPY DISCHARGE SUMMARY  Visits from Start of Care: 4 Plan: Patient agrees to discharge.  Patient goals were partially met. Patient is being discharged due to - not returning since last visit.    Lyndee Hensen, PT, DPT 12:04 PM  10/24/21

## 2020-08-31 ENCOUNTER — Encounter: Payer: 59 | Admitting: Physical Therapy

## 2020-08-31 NOTE — Progress Notes (Deleted)
   I, [name], LAT, ATC acting as a scribe for [name], MD.  John [name] is a 59 y.o. male who presents to [facility] at [facility] today for R hip pain. Pt was last seen by Dr. Denyse Amass on 07/31/20 and was given a steroid injection and prescribed Trazodone and advised to begin PT, of which he's completed 4 visits. Today, pt reports //. Pt locates pain to //.   Dx imaging: 07/25/20 R hip XR and L-spine XR  Pertinent review of systems: ***  Relevant historical information: ***   Exam:  There were no vitals taken for this visit. General: Well Developed, well nourished, and in no acute distress.   MSK: ***    Lab and Radiology Results No results found for this or any previous visit (from the past 72 hour(s)). No results found.     Assessment and Plan: 59 y.o. male with ***   PDMP not reviewed this encounter. No orders of the defined types were placed in this encounter.  No orders of the defined types were placed in this encounter.    Discussed warning signs or symptoms. Please see discharge instructions. Patient expresses understanding.   ***

## 2020-09-04 ENCOUNTER — Ambulatory Visit: Payer: 59 | Admitting: Family Medicine

## 2020-09-04 NOTE — Progress Notes (Signed)
I, [name], LAT, ATC acting as a scribe for [name], MD.  John [name] is a 59 y.o. male who presents to [facility] at [facility] today for f/u R hip pain. Pt was last seen by Dr. Denyse Amass on 07/31/20 and was advised to begin PT on 11/22, of which he's completed 4 visits and try Trazodone to help w/ sleeping. Pt started new job on 08/21/20. Today, pt reports the hip pain is the same. Pt has been taking Tylenol 650mg  which helps some. Pt notes that PT is OK, but he is still in pain. Pt locates pain to R low back and into hip. Pt c/o pain that radiates down the posterior aspect of leg sometimes.  Majority of pain is confined to his right low back.  He does not have much right groin pain or radiating pain at this time.  Dx imaging: 07/25/20 R hip XR and L-spine XR  Pertinent review of systems: No fevers or chills  Relevant historical information: Heart disease   Exam:  BP 122/76 (BP Location: Right Arm, Patient Position: Sitting, Cuff Size: Normal)   Pulse 73   Ht 5\' 10"  (1.778 m)   Wt 270 lb 3.2 oz (122.6 kg)   SpO2 98%   BMI 38.77 kg/m  General: Well Developed, well nourished, and in no acute distress.   MSK: L-spine normal-appearing nontender midline.  Decreased lumbar motion.    Lab and Radiology Results  EXAM: LUMBAR SPINE - 2-3 VIEW  COMPARISON:  02/28/2015  FINDINGS: Frontal and lateral views of the lumbar spine demonstrate 5 non-rib-bearing lumbar type vertebral bodies in anatomic alignment. Prominent spondylosis and facet hypertrophy in the lower lumbar spine, greatest at L5/S1, not significantly changed since prior study. Sacroiliac joints are normal.  IMPRESSION: 1. Fairly stable spondylosis and facet hypertrophy at the lumbosacral junction. No acute bony abnormality.   Electronically Signed   By: M.D.   On: 07/25/2020 22:10  I, Sharlet Salina, personally (independently) visualized and performed the  interpretation of the images attached in this note.     Assessment and Plan: 59 y.o. male with right low back pain thought to be due mostly to facet DJD and DDD.  He initially did have some radicular component but that seems to be a lesser issue now.  Additionally he is coexisting right hip arthritis but that is much better following an injection.  Unfortunate his back pain has not improved much with physical therapy.  At this point he has failed conservative management.  Plan for MRI for facet injection planning. Will prescribe low-dose tizanidine for use as needed as well.  Okay to use Tylenol.  Avoid NSAIDs given heart history.  PDMP not reviewed this encounter. Orders Placed This Encounter  Procedures  . MR Lumbar Spine Wo Contrast    Standing Status:   Future    Standing Expiration Date:   09/05/2021    Order Specific Question:   What is the patient's sedation requirement?    Answer:   No Sedation    Order Specific Question:   Does the patient have a pacemaker or implanted devices?    Answer:   No    Order Specific Question:   Preferred imaging location?    Answer:   46 (table limit-350lbs)   Meds ordered this encounter  Medications  . tiZANidine (ZANAFLEX) 4 MG tablet    Sig: Take 1 tablet (4 mg total) by mouth every 6 (six) hours as needed  for muscle spasms.    Dispense:  60 tablet    Refill:  1     Discussed warning signs or symptoms. Please see discharge instructions. Patient expresses understanding.   The above documentation has been reviewed and is accurate and complete [name], M.D.

## 2020-09-05 ENCOUNTER — Other Ambulatory Visit: Payer: Self-pay

## 2020-09-05 ENCOUNTER — Ambulatory Visit (INDEPENDENT_AMBULATORY_CARE_PROVIDER_SITE_OTHER): Payer: 59 | Admitting: Family Medicine

## 2020-09-05 VITALS — BP 122/76 | HR 73 | Ht 70.0 in | Wt 270.2 lb

## 2020-09-05 DIAGNOSIS — M25551 Pain in right hip: Secondary | ICD-10-CM

## 2020-09-05 DIAGNOSIS — M545 Low back pain, unspecified: Secondary | ICD-10-CM

## 2020-09-05 MED ORDER — TIZANIDINE HCL 4 MG PO TABS: 4.0000 mg | ORAL_TABLET | Freq: Four times a day (QID) | ORAL | 1 refills | Status: DC | PRN

## 2020-09-05 NOTE — Patient Instructions (Signed)
Thank you for coming in today.  Take the tizanidine at bedtime as needed. It may make you sleepy.   The gabapentin will mostly be used for nerve pain.   Ok to take with tylenol.

## 2020-09-06 ENCOUNTER — Encounter: Payer: Self-pay | Admitting: Family Medicine

## 2020-09-06 ENCOUNTER — Ambulatory Visit (INDEPENDENT_AMBULATORY_CARE_PROVIDER_SITE_OTHER): Payer: 59 | Admitting: Family Medicine

## 2020-09-06 ENCOUNTER — Other Ambulatory Visit: Payer: Self-pay

## 2020-09-06 ENCOUNTER — Encounter: Payer: 59 | Admitting: Physical Therapy

## 2020-09-06 VITALS — BP 157/95 | HR 86 | Temp 98.6°F | Ht 70.0 in | Wt 269.6 lb

## 2020-09-06 DIAGNOSIS — L03032 Cellulitis of left toe: Secondary | ICD-10-CM | POA: Diagnosis not present

## 2020-09-06 NOTE — Progress Notes (Signed)
   John [name] is a 59 y.o. male who presents today for an office visit.  Assessment/Plan:  Paronychia No red flags.  I&D performed today.  See below procedure note.  He tolerated well.  Discussed care after including salt soaks and topical antibiotics.  Discussed reasons to return to care.  Can use over-the-counter analgesics as needed for pain.    Subjective:  HPI:  Patient here with painful left great toe nail.  Started a few days ago.  Recently had pedicure.  Has noticed more redness and swelling to lateral aspect of left of left great toenail. Symptoms worsening. No fevers or chills.        Objective:  Physical Exam: BP (!) 157/95   Pulse 86   Temp 98.6 F (37 C) (Temporal)   Ht 5\' 10"  (1.778 m)   Wt 269 lb 9.6 oz (122.3 kg)   SpO2 98%   BMI 38.68 kg/m   Gen: No acute distress, resting comfortably MSK: Left lateral great toenail with erythema and edema. Neuro: Grossly normal, moves all extremities Psych: Normal affect and thought content  Incision and Drainage Procedure Note  Pre-operative Diagnosis: Paronychia  Post-operative Diagnosis: same  Indications: Therapeutic  Anesthesia: ethyl chloride spray  Procedure Details  The procedure, risks and complications have been discussed in detail (including, but not limited to airway compromise, infection, bleeding) with the patient, and the patient has signed consent to the procedure.  The skin was sterilely prepped and draped over the affected area in the usual fashion. After adequate local anesthesia, I&D with a #11 blade was performed on the left great toenail. Purulent drainage: present The patient was observed until stable.  Findings: paronychia  EBL: 1 cc's  Drains: N/A  Condition: Tolerated procedure well  Complications: none.      . Katina Degree, MD 09/06/2020 9:31 AM

## 2020-09-06 NOTE — Patient Instructions (Signed)
Paronychia Paronychia is an infection of the skin. It happens near a fingernail or toenail. It may cause pain and swelling around the nail. In some cases, a fluid-filled bump (abscess) can form near or under the nail. Usually, this condition is not serious, and it clears up with treatment. Follow these instructions at home: Wound care  Keep the affected area clean.  Soak the fingers or toes in warm water as told by your doctor. You may be told to do this for 20 minutes, 2-3 times a day.  Keep the area dry when you are not soaking it.  Do not try to drain a fluid-filled bump on your own.  Follow instructions from your doctor about how to take care of the affected area. Make sure you: ? Wash your hands with soap and water before you change your bandage (dressing). If you cannot use soap and water, use hand sanitizer. ? Change your bandage as told by your doctor.  If you had a fluid-filled bump and your doctor drained it, check the area every day for signs of infection. Check for: ? Redness, swelling, or pain. ? Fluid or blood. ? Warmth. ? Pus or a bad smell. Medicines   Take over-the-counter and prescription medicines only as told by your doctor.  If you were prescribed an antibiotic medicine, take it as told by your doctor. Do not stop taking it even if you start to feel better. General instructions  Avoid touching any chemicals.  Do not pick at the affected area. Prevention  To prevent this condition from happening again: ? Wear rubber gloves when putting your hands in water for washing dishes or other tasks. ? Wear gloves if your hands might touch cleaners or chemicals. ? Avoid injuring your nails or fingertips. ? Do not bite your nails or tear hangnails. ? Do not cut your nails very short. ? Do not cut the skin at the base and sides of the nail (cuticles). ? Use clean nail clippers or scissors when trimming nails. Contact a doctor if:  You feel worse.  You do not get  better.  You have more fluid, blood, or pus coming from the affected area.  Your finger or knuckle is swollen or is hard to move. Get help right away if you have:  A fever or chills.  Redness spreading from the affected area.  Pain in a joint or muscle. Summary  Paronychia is an infection of the skin. It happens near a fingernail or toenail.  This condition may cause pain and swelling around the nail.  Soak the fingers or toes in warm water as told by your doctor.  Usually, this condition is not serious, and it clears up with treatment. This information is not intended to replace advice given to you by your health care provider. Make sure you discuss any questions you have with your health care provider. Document Revised: 09/19/2017 Document Reviewed: 09/15/2017 Elsevier Patient Education  2020 Elsevier Inc.  

## 2020-09-11 ENCOUNTER — Other Ambulatory Visit: Payer: Self-pay | Admitting: Family Medicine

## 2020-09-13 ENCOUNTER — Other Ambulatory Visit: Payer: Self-pay | Admitting: Family Medicine

## 2020-09-19 NOTE — Addendum Note (Signed)
Addended by: Debbe Odea R on: 09/19/2020 01:41 PM   Modules accepted: Orders

## 2020-10-02 ENCOUNTER — Telehealth: Payer: 59 | Admitting: Family Medicine

## 2020-10-03 ENCOUNTER — Telehealth (HOSPITAL_COMMUNITY): Payer: Self-pay | Admitting: Pharmacist

## 2020-10-03 NOTE — Telephone Encounter (Signed)
Advanced Heart Failure Patient Advocate Encounter  Prior Authorization for sildenafil has been approved.    Effective dates: 10/03/20 through 10/02/21  Karle Plumber, PharmD, BCPS, BCCP, CPP Heart Failure Clinic Pharmacist [phone]

## 2020-10-03 NOTE — Telephone Encounter (Signed)
Patient Advocate Encounter   Received notification from [employer] that prior authorization for sildenafil is required.   PA submitted on CoverMyMeds Key [id number] Status is pending   Will continue to follow.   Karle Plumber, PharmD, BCPS, BCCP, CPP Heart Failure Clinic Pharmacist [phone]

## 2020-10-04 ENCOUNTER — Other Ambulatory Visit: Payer: Self-pay | Admitting: Family Medicine

## 2020-10-05 NOTE — Patient Instructions (Incomplete)
Health Maintenance Due  Topic Date Due  . COLONOSCOPY (Pts 45-35yrs Insurance coverage will need to be confirmed)  Never done   Depression screen [facility] 2/9 04/27/2020 01/20/2020 12/06/2019  Decreased Interest 0 0 0  Down, Depressed, Hopeless 0 0 0  PHQ - 2 Score 0 0 0  Altered sleeping 0 - -  Tired, decreased energy 1 - -  Change in appetite 0 - -  Feeling bad or failure about yourself  1 - -  Trouble concentrating 0 - -  Moving slowly or fidgety/restless 0 - -  Suicidal thoughts 0 - -  PHQ-9 Score 2 - -  Difficult doing work/chores Not difficult at all - -  Some recent data might be hidden

## 2020-10-05 NOTE — Progress Notes (Signed)
I, John [name], LAT, ATC, am serving as scribe for Dr. [name].  John [name] is a 60 y.o. male who presents to [facility] at [facility] today for f/u of R-sided LBP.  He was last seen by Dr. Denyse [name] on 09/05/20 for f/u and was prescribed Tizanidine.  Prior to that he was seen on 07/31/20 and had a R hip injection.  He completed a few PT visits but did not note much relief in his symptoms.  Since his last visit w/ Dr. Denyse [name], pt reports R hip and LBP have worsened. Pt reports his MRI is scheduled and upcoming. Pt c/o pain that radiates down anterior thigh and into groin. Pt locates pain to R side of low back and lateral R hip. Pt increased gabapentin dose and has been getting some relief. Pt is also taking Tylenol arthritis x2 days.  Diagnostic imaging: L-spine MRI - future on 10/24/20; R hip XR and L-spine XR- 07/25/20  Pertinent review of systems: No fevers or chills  Relevant historical information: Heart failure, hypertension, BMI 39..4 history left total hip replacement 2016 [facility]..   Exam:  BP 134/88 (BP Location: Right Arm, Patient Position: Sitting, Cuff Size: Normal)   Pulse 75   Ht 5\' 10"  (1.778 m)   Wt 275 lb (124.7 kg)   SpO2 98%   BMI 39.46 kg/m  General: Well Developed, well nourished, and in no acute distress.   MSK: L-spine normal-appearing nontender midline.  Tender palpation right SI joint. Right hip normal.  Decreased rotation and flexion.    Lab and Radiology Results  Procedure: Real-time Ultrasound Guided Injection of right SI joint Device: Philips Affiniti 50G Images permanently stored and available for review in PACS Verbal informed consent obtained.  Discussed risks and benefits of procedure. Warned about infection bleeding damage to structures skin hypopigmentation and fat atrophy among others. Patient expresses understanding and agreement Time-out conducted.   Noted no overlying erythema, induration, or other signs of  local infection.   Skin prepped in a sterile fashion.   Local anesthesia: Topical Ethyl chloride.   With sterile technique and under real time ultrasound guidance:  40 mg of Kenalog and 2 mg of Marcaine injected into right SI joint. Fluid seen entering the joint.   Completed without difficulty   Pain moderately resolved suggesting accurate placement of the medication.   Advised to call if fevers/chills, erythema, induration, drainage, or persistent bleeding.   Images permanently stored and available for review in the ultrasound unit.  Impression: Technically successful ultrasound guided injection.      Assessment and Plan: 60 y.o. male with multifactorial low back pain into including pain into the hip.  Patient has right low back pain.  Some the pain I think is due to SI joint dysfunction.  I think a lot of the pain is also due to facet joint degeneration.  This pain is being further explored with lumbar spine MRI scheduled for February 8.  Anticipate proceeding with facet joint injection in the future.    Today we will proceed with SI joint injection which should be helpful.  He is continue to have a fair amount of anterior groin and hip pain thought to be due to hip DJD.  Fundamentally I think his hip is failing and will likely need a hip replacement in the near future.  He is just on the cusp of being eligible for hip replacement based on his BMI of 39.4.  Advise working on weight loss.  Plan on recheck after his MRI on February 8.  We will follow-up results of MRI at that point and plan for facet injections and discuss further hop management strategies at that time.   PDMP not reviewed this encounter. Orders Placed This Encounter  Procedures  . [place] LIMITED JOINT SPACE STRUCTURES LOW RIGHT(NO LINKED CHARGES)    Standing Status:   Future    Number of Occurrences:   1    Standing Expiration Date:   04/05/2021    Order Specific Question:   Reason for Exam (SYMPTOM  OR DIAGNOSIS  REQUIRED)    Answer:   chronic right hip pain    Order Specific Question:   Preferred imaging location?    Answer:   [facility]   No orders of the defined types were placed in this encounter.    Discussed warning signs or symptoms. Please see discharge instructions. Patient expresses understanding.   The above documentation has been reviewed and is accurate and complete [name], M.D.

## 2020-10-05 NOTE — Progress Notes (Signed)
Phone [phone] Virtual visit via phonenote   Subjective:   Chief Complaint  Patient presents with  . office visit     Patient needs a return to work note, Patient states that he tested negative a week ago from [facility].    This visit type was conducted due to national recommendations for restrictions regarding the COVID-19 Pandemic (e.g. social distancing).  This format is felt to be most appropriate for this patient at this time balancing risks to patient and risks to population by having him in for in person visit.  All issues noted in this document were discussed and addressed.  No physical exam was performed (except for noted visual exam or audio findings with Telehealth visits).  The patient has consented to conduct a Telehealth visit and understands insurance will be billed.   Our team/I connected with [name] at  1:20 PM EST by phone (patient did not have equipment for webex) and verified that I am speaking with the correct person using two identifiers.  Location patient: [facility] Location provider: [facility], office Persons participating in the virtual visit:  patient  Time on phone: 7 minutes Counseling provided about return to work/ recent illness  Our team/I discussed the limitations of evaluation and management by telemedicine and the availability of in person appointments. In light of current covid-19 pandemic, patient also understands that we are trying to protect them by minimizing in office contact if at all possible.  The patient expressed consent for telemedicine visit and agreed to proceed. Patient understands insurance will be billed.   Past Medical History-  Patient Active Problem List   Diagnosis Date Noted  . Paroxysmal atrial fibrillation (HCC) 04/14/2020    Priority: High  . S/P ICD (internal cardiac defibrillator) procedure 04/14/2020    Priority: High  . Status post reverse total shoulder replacement, left 11/11/2019    Priority: High  .  Non-ischemic cardiomyopathy (HCC)     Priority: High  . Chronic combined systolic and diastolic CHF, NYHA class 3 (HCC) 10/2016    Priority: High  . Alcohol dependence in remission (HCC) 03/19/2016    Priority: High  . GAD (generalized anxiety disorder) 05/31/2020    Priority: Medium  . Hyperlipidemia 05/27/2018    Priority: Medium  . Essential hypertension 11/08/2016    Priority: Medium  . GI bleed 03/19/2016    Priority: Medium  . ED (erectile dysfunction) 08/21/2015    Priority: Medium  . Hyperglycemia 08/21/2015    Priority: Medium  . Insomnia 08/21/2015    Priority: Medium  . Sleep apnea 08/21/2015    Priority: Medium  . Unspecified fracture of upper end of left humerus, initial encounter for closed fracture 09/03/2017    Priority: Low  . S/P shoulder replacement 11/24/2015    Priority: Low  . Allergic rhinitis 08/21/2015    Priority: Low  . Degenerative joint disease of left hip 03/27/2015    Priority: Low    Medications- reviewed and updated Current Outpatient Medications  Medication Sig Dispense Refill  . acetaminophen (TYLENOL) 650 MG CR tablet Take 650 mg by mouth every 8 (eight) hours as needed for pain.    Marland Kitchen albuterol (VENTOLIN HFA) 108 (90 Base) MCG/ACT inhaler TAKE 2 PUFFS BY MOUTH EVERY 6 HOURS AS NEEDED FOR WHEEZE OR SHORTNESS OF BREATH 8.5 each 3  . carvedilol (COREG) 25 MG tablet TAKE 1 TABLET (25 MG TOTAL) BY MOUTH 2 (TWO) TIMES DAILY WITH A MEAL. 60 tablet 3  . diclofenac Sodium (VOLTAREN)  1 % GEL Apply 4 g topically 4 (four) times daily. To affected joint. 100 g 11  . ELIQUIS 5 MG TABS tablet TAKE 1 TABLET BY MOUTH TWICE A DAY 180 tablet 3  . ENTRESTO 97-103 MG TAKE 1 TABLET BY MOUTH 2 (TWO) TIMES DAILY. 60 tablet 5  . escitalopram (LEXAPRO) 10 MG tablet TAKE 1 TABLET BY MOUTH EVERY DAY 90 tablet 2  . FARXIGA 10 MG TABS tablet TAKE 1 TABLET BY MOUTH DAILY BEFORE BREAKFAST 30 tablet 11  . furosemide (LASIX) 40 MG tablet Take 1 tablet (40 mg total) by  mouth every morning AND 0.5 tablets (20 mg total) every evening. 135 tablet 3  . gabapentin (NEURONTIN) 300 MG capsule Take 1 capsule (300 mg total) by mouth 3 (three) times daily as needed. (Patient taking differently: Take 300 mg by mouth 4 (four) times daily as needed.) 90 capsule 3  . LORazepam (ATIVAN) 1 MG tablet Take 1 tablet (1 mg total) by mouth daily as needed for anxiety (do not take ambien within 8 hours of lorazepam). 30 tablet 5  . Multiple Vitamins-Minerals (MULTIVITAMIN WITH MINERALS) tablet Take 1 tablet by mouth daily.    . predniSONE (DELTASONE) 20 MG tablet Take 2 pills for 3 days, 1 pill for 4 days 10 tablet 0  . rosuvastatin (CRESTOR) 20 MG tablet TAKE 1 TABLET BY MOUTH EVERY DAY 90 tablet 3  . sildenafil (VIAGRA) 100 MG tablet TAKE 0.5 TABLETS (50 MG TOTAL) BY MOUTH AT BEDTIME AS NEEDED FOR ERECTILE DYSFUNCTION. 10 tablet 3  . spironolactone (ALDACTONE) 25 MG tablet TAKE 1 TABLET BY MOUTH EVERY DAY 30 tablet 11  . tiZANidine (ZANAFLEX) 4 MG tablet Take 1 tablet (4 mg total) by mouth every 6 (six) hours as needed for muscle spasms. 60 tablet 1  . traZODone (DESYREL) 50 MG tablet TAKE 1 TABLET BY MOUTH EVERYDAY AT BEDTIME 90 tablet 1  . VASCEPA 1 g capsule TAKE 2 CAPSULES (2 G TOTAL) BY MOUTH 2 (TWO) TIMES DAILY. 120 capsule 3  . zolpidem (AMBIEN) 10 MG tablet Take 1 tablet (10 mg total) by mouth at bedtime as needed for sleep. 30 tablet 5   No current facility-administered medications for this visit.     Objective:  no self reported vitals. States afebrile Nonlabored voice, normal speech      Assessment and Plan   # cough/fatigue S: started with [employer] in 2021 and went down for training to [place]. There was an outbreak of covid there. 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 He stopped at work on January 12th. He went back to [place] and went home to get tested for covid about a week ago and was  negative. He experienced fatigue, some cough, low grade temperature elevation, diarrhea. Has felt well, back to normal for at least 3-5 days.  A/P: patient with cough and fatigue that took him out of work for several days. Tested negative for covid. Even if this was a false negative result- he would meet CDC criteria for return to work at this point with complete resolution of symptoms as long as he continues to wear a mask.    Also encouraged complete cessation of alcohol- he has cut down significantly  Recommended follow up: as needed for acute concern Future Appointments  Date Time Provider Department Center  10/12/2020  1:00 PM Shanna Cisco, NP [facility] None  10/17/2020  2:20 PM Laurey Morale, MD [facility] None  10/24/2020  1:00 PM [facility] 1 [facility]  11/02/2020  7:05 AM [facility] DEVICE REMOTES [facility]  02/01/2021  7:05 AM [facility] DEVICE REMOTES [facility]  05/03/2021  7:05 AM [facility] DEVICE REMOTES [facility]  08/02/2021  7:05 AM [facility] DEVICE REMOTES [facility]  11/01/2021  7:05 AM [facility] DEVICE REMOTES [facility]  01/31/2022  7:05 AM [facility] DEVICE REMOTES [facility]    Lab/Order associations:   ICD-10-CM   1. Cough  R05.9    Return precautions advised.  [name], MD

## 2020-10-06 ENCOUNTER — Other Ambulatory Visit: Payer: Self-pay

## 2020-10-06 ENCOUNTER — Ambulatory Visit (INDEPENDENT_AMBULATORY_CARE_PROVIDER_SITE_OTHER): Payer: 59 | Admitting: Family Medicine

## 2020-10-06 ENCOUNTER — Ambulatory Visit: Payer: Self-pay

## 2020-10-06 ENCOUNTER — Encounter: Payer: Self-pay | Admitting: Family Medicine

## 2020-10-06 ENCOUNTER — Telehealth (INDEPENDENT_AMBULATORY_CARE_PROVIDER_SITE_OTHER): Payer: 59 | Admitting: Family Medicine

## 2020-10-06 ENCOUNTER — Telehealth: Payer: 59 | Admitting: Family Medicine

## 2020-10-06 VITALS — BP 134/88 | HR 75 | Ht 70.0 in | Wt 275.0 lb

## 2020-10-06 DIAGNOSIS — R059 Cough, unspecified: Secondary | ICD-10-CM

## 2020-10-06 DIAGNOSIS — M545 Low back pain, unspecified: Secondary | ICD-10-CM

## 2020-10-06 DIAGNOSIS — M25551 Pain in right hip: Secondary | ICD-10-CM | POA: Diagnosis not present

## 2020-10-06 DIAGNOSIS — M249 Joint derangement, unspecified: Secondary | ICD-10-CM

## 2020-10-06 NOTE — Patient Instructions (Addendum)
Thank you for coming in today.  Lets plan on checking back about a week after the MRI.  (Around Feb 10th).   Let me know how the shot goes.   Call or go to the ER if you develop a large red swollen joint with extreme pain or oozing puss.

## 2020-10-12 ENCOUNTER — Other Ambulatory Visit: Payer: Self-pay

## 2020-10-12 ENCOUNTER — Telehealth (HOSPITAL_COMMUNITY): Payer: 59 | Admitting: Psychiatry

## 2020-10-12 NOTE — Progress Notes (Signed)
I, John [name], LAT, ATC acting as a scribe for John Graham, MD.  John [name] is a 60 y.o. male who presents to [facility] at [facility] today for continued R hip pain. Pt was last seen by Dr. Denyse Amass on 10/06/20 and was given a right SI joint steroid injection and was advised to work on weight loss and plan for recheck after MRI. The right SI joint injection helped temporarily but did not provide lasting help. Today, pt reports injection helped. 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 Pt locates pain on lateral aspect of R hip. Pt reports he will need an work note.  He notes the gabapentin he feels has been helpful overall for his pain but he is feeling a bit depressed and has read that that could be a side effect. He is interested in other medications that may be helpful.  Dx imaging: scheduled 10/24/20 L-spine MRI  07/25/20 R hip XR and L-spine XR  Pertinent review of systems: No fevers or chills  Relevant historical information: Heart disease and obesity   Exam:  BP (!) 148/88 (BP Location: Left Arm, Patient Position: Sitting, Cuff Size: Normal)   Pulse 81   Ht 5\' 10"  (1.778 m)   Wt 267 lb 6.4 oz (121.3 kg)   SpO2 94%   BMI 38.37 kg/m  General: Well Developed, well nourished, and in no acute distress.   MSK: L-spine normal-appearing Nontender midline. Tender palpation right lumbar paraspinal musculature. Nontender SI joint. Lower extremity strength is intact.      Assessment and Plan: 60 y.o. male with right low back pain with acute exacerbation thought to be muscle strain and spasm. Plan for physical therapy. I doubtful that repeat SI injection will be helpful. Refill tizanidine.  We will switch from gabapentin to the Lyrica as that may be helpful.   Recheck following MRI.  Work note written.  PDMP not reviewed this encounter. Orders Placed This Encounter  Procedures  . 46 LIMITED JOINT  SPACE STRUCTURES LOW RIGHT(NO LINKED CHARGES)    Standing Status:   Future    Number of Occurrences:   1    Standing Expiration Date:   04/12/2021    Order Specific Question:   Reason for Exam (SYMPTOM  OR DIAGNOSIS REQUIRED)    Answer:   chronic right hip pain    Order Specific Question:   Preferred imaging location?    Answer:   [facility]  . Ambulatory referral to Physical Therapy    Referral Priority:   Routine    Referral Type:   Physical Medicine    Referral Reason:   Specialty Services Required    Requested Specialty:   Physical Therapy   Meds ordered this encounter  Medications  . pregabalin (LYRICA) 75 MG capsule    Sig: Take 1 capsule (75 mg total) by mouth 2 (two) times daily as needed.    Dispense:  60 capsule    Refill:  3  . tiZANidine (ZANAFLEX) 4 MG tablet    Sig: Take 1 tablet (4 mg total) by mouth every 6 (six) hours as needed for muscle spasms.    Dispense:  60 tablet    Refill:  1     Discussed warning signs or symptoms. Please see discharge instructions. Patient expresses understanding.   The above documentation has been reviewed and is accurate and complete [facility],  M.D.

## 2020-10-13 ENCOUNTER — Other Ambulatory Visit: Payer: Self-pay

## 2020-10-13 ENCOUNTER — Ambulatory Visit: Payer: Self-pay

## 2020-10-13 ENCOUNTER — Ambulatory Visit (INDEPENDENT_AMBULATORY_CARE_PROVIDER_SITE_OTHER): Payer: 59 | Admitting: Family Medicine

## 2020-10-13 VITALS — BP 148/88 | HR 81 | Ht 70.0 in | Wt 267.4 lb

## 2020-10-13 DIAGNOSIS — S39012A Strain of muscle, fascia and tendon of lower back, initial encounter: Secondary | ICD-10-CM | POA: Diagnosis not present

## 2020-10-13 DIAGNOSIS — M25551 Pain in right hip: Secondary | ICD-10-CM | POA: Diagnosis not present

## 2020-10-13 MED ORDER — TIZANIDINE HCL 4 MG PO TABS: 4.0000 mg | ORAL_TABLET | Freq: Four times a day (QID) | ORAL | 1 refills | Status: DC | PRN

## 2020-10-13 MED ORDER — PREGABALIN 75 MG PO CAPS: 75.0000 mg | ORAL_CAPSULE | Freq: Two times a day (BID) | ORAL | 3 refills | Status: DC | PRN

## 2020-10-13 NOTE — Patient Instructions (Addendum)
Thank you for coming in today.  STOP gabapentin.   Try lyrica.   Heat will help.   Pt will help the muscles a bit .   Plan for PT.   Plan for MRI as scheduled.   TENS UNIT: This is helpful for muscle pain and spasm.   Search and Purchase a TENS 7000 2nd edition at  www.tenspros.com or www.Amazon.com It should be less than $30.     TENS unit instructions: Do not shower or bathe with the unit on . Turn the unit off before removing electrodes or batteries . If the electrodes lose stickiness add a drop of water to the electrodes after they are disconnected from the unit and place on plastic sheet. If you continued to have difficulty, call the TENS unit company to purchase more electrodes. . Do not apply lotion on the skin area prior to use. Make sure the skin is clean and dry as this will help prolong the life of the electrodes. . After use, always check skin for unusual red areas, rash or other skin difficulties. If there are any skin problems, does not apply electrodes to the same area. . Never remove the electrodes from the unit by pulling the wires. . Do not use the TENS unit or electrodes other than as directed. . Do not change electrode placement without consultating your therapist or physician. Marland Kitchen Keep 2 fingers with between each electrode. . Wear time ratio is 2:1, on to off times.    For example on for 30 minutes off for 15 minutes and then on for 30 minutes off for 15 minutes

## 2020-10-16 ENCOUNTER — Other Ambulatory Visit: Payer: Self-pay | Admitting: Family Medicine

## 2020-10-16 ENCOUNTER — Other Ambulatory Visit (HOSPITAL_COMMUNITY): Payer: Self-pay | Admitting: Cardiology

## 2020-10-16 NOTE — Telephone Encounter (Signed)
Rx refill request

## 2020-10-17 ENCOUNTER — Ambulatory Visit (HOSPITAL_COMMUNITY)
Admission: RE | Admit: 2020-10-17 | Discharge: 2020-10-17 | Disposition: A | Discharge status: Home / Self Care | Payer: 59 | Source: Ambulatory Visit | Attending: Cardiology | Admitting: Cardiology

## 2020-10-17 ENCOUNTER — Other Ambulatory Visit: Payer: Self-pay

## 2020-10-17 ENCOUNTER — Encounter (HOSPITAL_COMMUNITY): Payer: Self-pay | Admitting: Cardiology

## 2020-10-17 VITALS — BP 104/60 | HR 71 | Wt 270.8 lb

## 2020-10-17 DIAGNOSIS — Z7901 Long term (current) use of anticoagulants: Secondary | ICD-10-CM | POA: Insufficient documentation

## 2020-10-17 DIAGNOSIS — I5022 Chronic systolic (congestive) heart failure: Secondary | ICD-10-CM | POA: Insufficient documentation

## 2020-10-17 DIAGNOSIS — Z95810 Presence of automatic (implantable) cardiac defibrillator: Secondary | ICD-10-CM | POA: Insufficient documentation

## 2020-10-17 DIAGNOSIS — Z7984 Long term (current) use of oral hypoglycemic drugs: Secondary | ICD-10-CM | POA: Insufficient documentation

## 2020-10-17 DIAGNOSIS — G4733 Obstructive sleep apnea (adult) (pediatric): Secondary | ICD-10-CM | POA: Insufficient documentation

## 2020-10-17 DIAGNOSIS — I428 Other cardiomyopathies: Secondary | ICD-10-CM | POA: Insufficient documentation

## 2020-10-17 DIAGNOSIS — I5042 Chronic combined systolic (congestive) and diastolic (congestive) heart failure: Secondary | ICD-10-CM | POA: Diagnosis not present

## 2020-10-17 DIAGNOSIS — Z6838 Body mass index (BMI) 38.0-38.9, adult: Secondary | ICD-10-CM | POA: Insufficient documentation

## 2020-10-17 DIAGNOSIS — F419 Anxiety disorder, unspecified: Secondary | ICD-10-CM | POA: Insufficient documentation

## 2020-10-17 DIAGNOSIS — Z79899 Other long term (current) drug therapy: Secondary | ICD-10-CM | POA: Insufficient documentation

## 2020-10-17 DIAGNOSIS — I5082 Biventricular heart failure: Secondary | ICD-10-CM | POA: Insufficient documentation

## 2020-10-17 DIAGNOSIS — F1010 Alcohol abuse, uncomplicated: Secondary | ICD-10-CM | POA: Insufficient documentation

## 2020-10-17 DIAGNOSIS — E669 Obesity, unspecified: Secondary | ICD-10-CM | POA: Diagnosis not present

## 2020-10-17 DIAGNOSIS — Z791 Long term (current) use of non-steroidal anti-inflammatories (NSAID): Secondary | ICD-10-CM | POA: Insufficient documentation

## 2020-10-17 DIAGNOSIS — I480 Paroxysmal atrial fibrillation: Secondary | ICD-10-CM | POA: Diagnosis not present

## 2020-10-17 LAB — BASIC METABOLIC PANEL
Anion gap: 12 (ref 5–15)
BUN: 15 mg/dL (ref 6–20)
CO2: 24 mmol/L (ref 22–32)
Calcium: 9.4 mg/dL (ref 8.9–10.3)
Chloride: 93 mmol/L — ABNORMAL LOW (ref 98–111)
Creatinine, Ser: 1.03 mg/dL (ref 0.61–1.24)
GFR, Estimated: >60 mL/min (ref >60)
Glucose, Bld: 108 mg/dL — ABNORMAL HIGH (ref 70–99)
Potassium: 4.3 mmol/L (ref 3.5–5.1)
Sodium: 129 mmol/L — ABNORMAL LOW (ref 135–145)

## 2020-10-17 LAB — BRAIN NATRIURETIC PEPTIDE: B Natriuretic Peptide: 1380.7 pg/mL — ABNORMAL HIGH (ref 0.0–100.0)

## 2020-10-17 NOTE — Patient Instructions (Addendum)
Labs done today. We will contact you only if your labs are abnormal.  No medication changes were made. Please continue all current medications as prescribed.  Your physician recommends that you schedule a follow-up appointment in: 3 months for an appointment with Dr. Shirlee Latch with an echo prior to your exam.  Your physician has requested that you have an echocardiogram. Echocardiography is a painless test that uses sound waves to create images of your heart. It provides your doctor with information about the size and shape of your heart and how well your heart's chambers and valves are working. This procedure takes approximately one hour. There are no restrictions for this procedure.  You have been referred to [facility]. They will contact you to schedule an appointment.  If you have any questions or concerns before your next appointment please send [place] a message through [place] or call our office at [phone].    TO LEAVE A MESSAGE FOR THE NURSE SELECT OPTION 2, PLEASE LEAVE A MESSAGE INCLUDING: . YOUR NAME . DATE OF BIRTH . CALL BACK NUMBER . REASON FOR CALL**this is important as we prioritize the call backs  YOU WILL RECEIVE A CALL BACK THE SAME DAY AS LONG AS YOU CALL BEFORE 4:00 PM   Do the following things EVERYDAY: 1) Weigh yourself in the morning before breakfast. Write it down and keep it in a log. 2) Take your medicines as prescribed 3) Eat low salt foods--Limit salt (sodium) to 2000 mg per day.  4) Stay as active as you can everyday 5) Limit all fluids for the day to less than 2 liters   At the [facility], you and your health needs are our priority. As part of our continuing mission to provide you with exceptional heart care, we have created designated Provider Care Teams. These Care Teams include your primary Cardiologist (physician) and Advanced Practice Providers (APPs- Physician Assistants and Nurse Practitioners) who all work  together to provide you with the care you need, when you need it.   You may see any of the following providers on your designated Care Team at your next follow up: Marland Kitchen Dr Arvilla Meres . Dr Marca Ancona . Tonye Becket, NP . Robbie Lis, PA . Karle Plumber, PharmD   Please be sure to bring in all your medications bottles to every appointment.

## 2020-10-18 NOTE — Progress Notes (Signed)
PCP: Dr. Nolen Mu Cardiology: Dr. Allyson Sabal HF Cardiology: Dr. Shirlee Latch  60 y.o. with history of nonischemic cardiomyopathy and OSA was referred by Dr. Allyson Sabal for evaluation of CHF.  He was initially diagnosed in early 2/18.  He had URI symptoms then developed dyspnea and put on weight. Echo was done in 2/18, showing EF 20-25%.  LHC in 2/18 showed normal coronaries. He was started on treatment for cardiomyopathy, and EF rose to 40% by 6/18. However, echo in 6/19 showed EF back down to 15-20% with severe RV dilation/dysfunction.  CPX was done in 7/19, showed moderate functional limitation though submaximal.  He had St Jude ICD placed in 8/19.   Atrial fibrillation noted in 12/20, he had TEE-guided DCCV in 12/20.  He remains in NSR today and has not felt palpitations.  EF on TEE was 25-30%.   He has been a heavy drinker but has cut back.  Still drinking on occasion, however.   He had left shoulder replacement in 2/21.  He developed a surgical site infection that is now resolved.    Patient returns for followup of CHF.  He is now working for [employer].  No longer has shoulder pain, but now having significant right hip pain.  He is going to be getting an MRI.  He has not been very active due to the pain and weight is up 12 lbs. He is generally staying away from ETOH (has had a couple of slips around the holidays).  Dyspnea walking long distances.  No chest pain.  No orthopnea/PND.  No lightheadedness.      ECG (personally reviewed): NSR, nonspecific ST changes.   Labs (7/19): K 4.9, creatinine 0.91 Labs (8/19): Na 128, K 4.9, creatinine 0.72 Labs (9/19): LDL 45 Labs (12/19): K 3.9, creatinine 0.94  Labs (1/20): K 4.5, creatinine 0.94, hgb 13.2 Labs (10/20): K 4.4, creatinine 0.99 Labs (12/20): K 4.6, creatinine 1.0 Labs (2/21): K 3.9, creatinine 0.86 Labs (4/21): K 4.9, creatinine 1.05 Labs (6/21): K 4, creatinine 0.94 Labs (12/21): K 4.6, creatinine 1.0, LFTs normal, hgb 14.1, TGs 163, LDL 71  PMH: 1.  OSA: uses CPAP.  2. Depression 3. Chronic systolic CHF: Nonischemic cardiomyopathy.  Diagnosed around 2/18.  - LHC (2/18): Normal coronaries.  - Echo (2/18): EF 20-25% - Echo (6/18): EF 40% - Echo (12/18): EF 35-40% - Echo (6/19): LV mildly dilated, EF 15-20%, RV severely dilated with severely decreased systolic function.  - CPX (7/19): peak VO2 16, VE/VCO2 slope 37, RER 1.03 => submaximal but probably moderate functional limitation.  - Cardiac MRI (7/19): EF 29% with mild LV dilation, mildly decreased RV systolic function EF 40%, mid-wall LGE throughtout the septal wall and the inferor wall.  - St Jude ICD placed in 8/19.  - Echo (12/19): EF 25-30%, diffuse hypokinesis, normal RV size and systolic function.  - Echo (12/20): EF 25-30%, moderate LVH, normal RV size and systolic function.  - TEE (12/20): EF 25-30%, mildly decreased RV systolic function.  4. ETOH abuse.  5. Atrial fibrillation: First noted in 12/20.  - DCCV to NSR in 12/20.  6. 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 No drugs. Lives in [place].   FH: Sister with congenital heart abnormality.  No other cardiac disease.   ROS: All systems reviewed and negative except as per HPI.  Current Outpatient Medications  Medication Sig Dispense Refill  . acetaminophen (TYLENOL) 650 MG CR tablet Take 650  mg by mouth every 8 (eight) hours as needed for pain.    Marland Kitchen albuterol (VENTOLIN HFA) 108 (90 Base) MCG/ACT inhaler TAKE 2 PUFFS BY MOUTH EVERY 6 HOURS AS NEEDED FOR WHEEZE OR SHORTNESS OF BREATH 8.5 each 3  . carvedilol (COREG) 25 MG tablet TAKE 1 TABLET (25 MG TOTAL) BY MOUTH 2 (TWO) TIMES DAILY WITH A MEAL. 60 tablet 3  . diclofenac Sodium (VOLTAREN) 1 % GEL Apply 4 g topically 4 (four) times daily. To affected joint. 100 g 11  . ELIQUIS 5 MG TABS tablet TAKE 1 TABLET BY MOUTH TWICE A DAY 180 tablet 3  . ENTRESTO 97-103 MG TAKE 1 TABLET BY MOUTH 2  (TWO) TIMES DAILY. 60 tablet 5  . escitalopram (LEXAPRO) 10 MG tablet TAKE 1 TABLET BY MOUTH EVERY DAY 90 tablet 2  . FARXIGA 10 MG TABS tablet TAKE 1 TABLET BY MOUTH DAILY BEFORE BREAKFAST 30 tablet 11  . furosemide (LASIX) 40 MG tablet Take 1 tablet (40 mg total) by mouth every morning AND 0.5 tablets (20 mg total) every evening. 135 tablet 3  . LORazepam (ATIVAN) 1 MG tablet Take 1 tablet (1 mg total) by mouth daily as needed for anxiety (do not take ambien within 8 hours of lorazepam). 30 tablet 5  . Multiple Vitamins-Minerals (MULTIVITAMIN WITH MINERALS) tablet Take 1 tablet by mouth daily.    . pregabalin (LYRICA) 75 MG capsule Take 1 capsule (75 mg total) by mouth 2 (two) times daily as needed. 60 capsule 3  . rosuvastatin (CRESTOR) 20 MG tablet TAKE 1 TABLET BY MOUTH EVERY DAY 30 tablet 11  . sildenafil (VIAGRA) 100 MG tablet TAKE 0.5 TABLETS (50 MG TOTAL) BY MOUTH AT BEDTIME AS NEEDED FOR ERECTILE DYSFUNCTION. 10 tablet 3  . spironolactone (ALDACTONE) 25 MG tablet TAKE 1 TABLET BY MOUTH EVERY DAY 30 tablet 11  . tiZANidine (ZANAFLEX) 4 MG tablet TAKE 1 TABLET BY MOUTH EVERY 6 HOURS AS NEEDED FOR MUSCLE SPASMS. 60 tablet 1  . traZODone (DESYREL) 50 MG tablet TAKE 1 TABLET BY MOUTH EVERYDAY AT BEDTIME 90 tablet 1  . VASCEPA 1 g capsule TAKE 2 CAPSULES (2 G TOTAL) BY MOUTH 2 (TWO) TIMES DAILY. 120 capsule 3  . zolpidem (AMBIEN) 10 MG tablet Take 1 tablet (10 mg total) by mouth at bedtime as needed for sleep. 30 tablet 5   No current facility-administered medications for this encounter.   BP 104/60   Pulse 71   Wt 122.8 kg (270 lb 12.8 oz)   SpO2 96%   BMI 38.86 kg/m  General: NAD, obese.  Neck: No JVD, no thyromegaly or thyroid nodule.  Lungs: Clear to auscultation bilaterally with normal respiratory effort. CV: Nondisplaced PMI.  Heart regular S1/S2, no S3/S4, no murmur.  No peripheral edema.  No carotid bruit.  Normal pedal pulses.  Abdomen: Soft, nontender, no hepatosplenomegaly,  no distention.  Skin: Intact without lesions or rashes.  Neurologic: Alert and oriented x 3.  Psych: Normal affect. Extremities: No clubbing or cyanosis.  HEENT: Normal.   Assessment/Plan: 1. Chronic systolic CHF: Nonischemic cardiomyopathy with biventricular failure.  Cath in 2/18 with no coronary disease.  Echo (6/19) with EF 15-20%, severe RV dilation/dysfunction.  CPX 7/19 submaximal but probably moderate functional limitation.  Cardiac MRI in 7/19 showed EF 29%.  LGE pattern was concerning for prior viral myocarditis. Echo in 12/20 showed stable EF 25-30%.  Now with St Jude ICD.  Cause of CMP suspected to be viral myocarditis versus ETOH.  No family history of cardiomyopathy.  NYHA class II symptoms. He does not look particularly volume overloaded but weight is up.  - Check BNP today, if significantly elevated would consider increasing Lasix.  - Continue dapagliflozin 10 mg daily.  - Continue spironolactone 25 mg daily.  BMET today.  - Continue Coreg 25 mg bid. - Continue Entresto 97/103 bid.  - Continue Lasix 40 mg daily.   - Continue to avoid ETOH.  - Increase activity level and work on sodium avoidance.  - Repeat echo at followup in 3 months.  2. OSA: Continue CPAP.  3. Anxiety: He is on sertraline.  4. ETOH Abuse: This is likely contributing to his cardiomyopathy and likely played a role in triggering atrial fibrillation.  He has mostly quit drinking (occasionally admits to "falling off the wagon").   5. Atrial fibrillation: Paroxysmal, remains in NSR after DCCV in 12/20.  - Continue Eliquis 5 mg bid.   - Stay off ETOH.   - If atrial fibrillation returns off ETOH, consider ablation.  6. Obesity: I will refer to [facility].   Followup in 3 months with echo.    Marca Ancona 10/18/2020

## 2020-10-19 ENCOUNTER — Telehealth (HOSPITAL_COMMUNITY): Payer: Self-pay | Admitting: *Deleted

## 2020-10-19 DIAGNOSIS — I5042 Chronic combined systolic (congestive) and diastolic (congestive) heart failure: Secondary | ICD-10-CM

## 2020-10-19 MED ORDER — FUROSEMIDE 40 MG PO TABS: ORAL_TABLET | ORAL | 3 refills | Status: DC

## 2020-10-19 NOTE — Telephone Encounter (Signed)
John [name], [place]  10/19/2020 2:02 PM EST Back to Top     Spoke with patient he is aware and agreeable with plan. Lab appt scheduled.    Chinita Pester, CMA  10/19/2020 9:27 AM EST      [facility]   Laurey Morale, MD  10/18/2020 6:02 PM EST      Increase Lasix to 60 mg bid with BMET in 1 week. Cut back on sodium intake.    John [name], [place]  10/18/2020 4:05 PM EST      Spoke with patient he stated he takes lasix 40mg  bid already. Routed to Dr.McLean    , CMA  10/17/2020 4:49 PM EST      12/15/2020 [facility]   [facility], MD  10/17/2020 4:39 PM EST      BNP is elevated. Would have him increase Lasix to 40 mg bid with BMET in 10 days. Na is low, cut back on fluid intake.

## 2020-10-19 NOTE — Telephone Encounter (Signed)
-----   Message from Laurey Morale, MD sent at 10/18/2020  6:02 PM EST ----- Increase Lasix to 60 mg bid with BMET in 1 week. Cut back on sodium intake.

## 2020-10-21 ENCOUNTER — Other Ambulatory Visit (HOSPITAL_COMMUNITY): Payer: Self-pay | Admitting: Cardiology

## 2020-10-21 ENCOUNTER — Other Ambulatory Visit: Payer: Self-pay | Admitting: Family Medicine

## 2020-10-23 NOTE — Telephone Encounter (Signed)
Rx request 

## 2020-10-24 ENCOUNTER — Other Ambulatory Visit: Payer: Self-pay

## 2020-10-24 ENCOUNTER — Ambulatory Visit (HOSPITAL_COMMUNITY)
Admission: RE | Admit: 2020-10-24 | Discharge: 2020-10-24 | Disposition: A | Discharge status: Home / Self Care | Payer: 59 | Source: Ambulatory Visit | Attending: Family Medicine | Admitting: Family Medicine

## 2020-10-24 ENCOUNTER — Telehealth: Payer: Self-pay | Admitting: Family Medicine

## 2020-10-24 DIAGNOSIS — M545 Low back pain, unspecified: Secondary | ICD-10-CM | POA: Diagnosis present

## 2020-10-24 DIAGNOSIS — M25551 Pain in right hip: Secondary | ICD-10-CM | POA: Insufficient documentation

## 2020-10-24 NOTE — Progress Notes (Signed)
Informed of MRI for today.   Device system confirmed to be MRI conditional, with implant date > 6 weeks ago and no evidence of abandoned or epicardial leads in review of most recent CXR Interrogation from today reviewed, pt is currently VS at 78 bpm No change to pacing mode.   Tachy-therapies to off.  Program device back to pre-MRI settings after completion of exam.  Graciella Freer, PA-C  10/24/2020 1:12 PM

## 2020-10-24 NOTE — Telephone Encounter (Signed)
Pt went today for his MRI, it was scheduled as lumbar spine but patient thought he was getting an MRI of his R hip as this is the most painful and replacement was discussed.

## 2020-10-25 NOTE — Progress Notes (Signed)
MRI lumbar spine shows some facet arthritis that could be a source of pain and potentially treated with injection. Additionally there is a possible pinched nerve in the low back.  I recommend that you schedule an appointment with me in the near future to go over these results in full detail.

## 2020-10-25 NOTE — Progress Notes (Unsigned)
I, [name], LAT, ATC acting as a scribe for [name], MD.  John [name] is a 60 y.o. male who presents to [facility] at [facility] today for L-spine MRI review and continued LBP and R hip pain. Pt was last seen by Dr. Denyse Amass on 10/13/20 and was switched to Lyrica from Gabapentin.  He was referred to PT, of which he's completed no visits. Today, pt reports that his R hip pain con't and he is also reporting popping in his R hip.  He states that he has a prior L hip THA and feels like his R hip is feeling similar.  He states that his R hip pain is worse and notes that he is having a hard time getting in and out of the car.  He has been taking Tylenol and Lyrica.  He is here today to follow-up his lumbar spine MRI.  MRIs originally obtained due to persistent chronic low back pain failing to improve with physical therapy and other conservative management.  He notes his hip pain is so dominant now that his back pain is much less of a concern.  He states that at this point he is ready to proceed to a right total hip replacement and wants that to be his priority.  Dx imaging: scheduled 10/24/20 L-spine MRI             07/25/20 R hip XR and L-spine XR  Pertinent review of systems: No fevers or chills  Relevant historical information: Atrial fibrillation, heart failure, obesity   Exam:  BP 104/64 (BP Location: Right Arm, Patient Position: Sitting, Cuff Size: Large)   Pulse 67   Ht 5\' 10"  (1.778 m)   Wt 271 lb (122.9 kg)   SpO2 96%   BMI 38.88 kg/m  General: Well Developed, well nourished, and in no acute distress.   MSK: L-spine normal-appearing Nontender midline. Degrees lumbar motion. Right hip normal-appearing Nontender. Significantly limited motion to internal rotation abduction.    Lab and Radiology Results No results found for this or any previous visit (from the past 72 hour(s)). MR Lumbar Spine Wo Contrast  Result Date: 10/24/2020 CLINICAL DATA:   Low back pain EXAM: MRI LUMBAR SPINE WITHOUT CONTRAST TECHNIQUE: Multiplanar, multisequence MR imaging of the lumbar spine was performed. No intravenous contrast was administered. COMPARISON:  Plain film July 25, 2020 FINDINGS: Segmentation:  Standard. Alignment:  Physiologic. Vertebrae:  No fracture, evidence of discitis, or bone lesion. Conus medullaris and cauda equina: Conus extends to the L1 level. Conus and cauda equina appear normal. Paraspinal and other soft tissues: Negative. Disc levels: T12-L1: Tiny posterior disc protrusion. No spinal canal or neural foraminal stenosis. L1-2: No spinal canal or neural foraminal stenosis. L2-3: No spinal canal or neural foraminal stenosis. L3-4: No spinal canal or neural foraminal stenosis. L4-5: Right asymmetric disc bulge and mild facet degenerative changes resulting in mild narrowing of the right subarticular zone. No significant spinal canal or neural foraminal stenosis. L5-S1: Loss of disc height, disc bulge with superimposed small central disc protrusion and mild-to-moderate facet degenerative changes resulting in mild narrowing of the bilateral subarticular zones and mild bilateral neural foraminal narrowing. IMPRESSION: 1. Mild degenerative changes of the lumbar spine, more pronounced at L5-S1 where there is mild narrowing of the bilateral subarticular zones and mild bilateral neural foraminal narrowing. 2. No high-grade spinal canal or neural foraminal stenosis at any level. Electronically Signed   By: July 27, 2020 M.D.   On: 10/24/2020  14:48   EXAM: DG HIP (WITH OR WITHOUT PELVIS) 2-3V RIGHT  COMPARISON:  None.  FINDINGS: Frontal view of the pelvis as well as frontal and frogleg lateral views of the right hip are obtained. There is moderate to severe right hip osteoarthritis with joint space narrowing and circumferential osteophyte formation. Left hip arthroplasty is identified without evidence of complication. Sacroiliac joints  are normal. There are no acute fractures.  IMPRESSION: 1. Moderate to severe right hip osteoarthritis. No acute bony abnormality.   Electronically Signed   By: Sharlet Salina M.D.   On: 07/25/2020 22:10  I, [name], personally (independently) visualized and performed the interpretation of the images attached in this note.     Assessment and Plan: 60 y.o. male with  Right hip pain.  Patient is having worsening hip pain felt in the anterior aspect of the groin and hip.  He has an x-ray from October 2021 showing moderate to severe DJD.  He states his current pain is similar to how his left hip felt prior to left total hip replacement.  At this point I do not think there is any point in proceeding with further imaging evaluation for his hip.  I think he emotionally is ready to proceed for total hip replacement.  On physical exam he certainly is ready.  His orthopedic surgeon almost certainly will require his own x-rays so we will hold off on getting my on repeat hip x-rays at this time.  Refer back to hip surgeon at [facility] to operated on his left hip Dr. Linna Caprice.  Back pain: Lumbar spine MRI does show some degenerative changes in L-spine but surprisingly does not have much abnormalities.  I believe a lot of his pain he was having was more muscle dysfunction and spasm.  This is now a back burner issue.  Happy to follow-up with this in the future if needed.  Could consider facet injections or further physical therapy.   PDMP not reviewed this encounter. Orders Placed This Encounter  Procedures  . Ambulatory referral to Orthopedic Surgery    Referral Priority:   Routine    Referral Type:   Surgical    Referral Reason:   Specialty Services Required    Referred to Provider:   Samson Frederic, MD    Requested Specialty:   Orthopedic Surgery    Number of Visits Requested:   1   No orders of the defined types were placed in this encounter.    Discussed warning signs or  symptoms. Please see discharge instructions. Patient expresses understanding.   The above documentation has been reviewed and is accurate and complete [name], M.D.

## 2020-10-25 NOTE — Telephone Encounter (Signed)
Pt scheduled for 10/26/2020.

## 2020-10-25 NOTE — Telephone Encounter (Signed)
Pt contacted via [place] and LM for him to schedule MRI f/u visit w/ Dr. Denyse Amass.

## 2020-10-25 NOTE — Telephone Encounter (Signed)
I do think follow-up is a good idea.

## 2020-10-26 ENCOUNTER — Other Ambulatory Visit: Payer: Self-pay

## 2020-10-26 ENCOUNTER — Encounter: Payer: Self-pay | Admitting: Family Medicine

## 2020-10-26 ENCOUNTER — Ambulatory Visit (INDEPENDENT_AMBULATORY_CARE_PROVIDER_SITE_OTHER): Payer: 59 | Admitting: Family Medicine

## 2020-10-26 VITALS — BP 104/64 | HR 67 | Ht 70.0 in | Wt 271.0 lb

## 2020-10-26 DIAGNOSIS — M545 Low back pain, unspecified: Secondary | ICD-10-CM

## 2020-10-26 DIAGNOSIS — M25551 Pain in right hip: Secondary | ICD-10-CM | POA: Diagnosis not present

## 2020-10-26 DIAGNOSIS — M1611 Unilateral primary osteoarthritis, right hip: Secondary | ICD-10-CM | POA: Diagnosis not present

## 2020-10-26 NOTE — Patient Instructions (Signed)
Thank you for coming in today.  You should hear from Dr Kathline Magic office soon about hip.   Keep your BMI less than 40 (weight less than 270 ish).   I think the majority of your pain is coming from your hip.

## 2020-10-27 ENCOUNTER — Other Ambulatory Visit (HOSPITAL_COMMUNITY): Payer: 59

## 2020-10-30 ENCOUNTER — Other Ambulatory Visit (HOSPITAL_COMMUNITY): Payer: 59

## 2020-10-31 ENCOUNTER — Other Ambulatory Visit (HOSPITAL_COMMUNITY): Payer: 59

## 2020-10-31 ENCOUNTER — Ambulatory Visit (INDEPENDENT_AMBULATORY_CARE_PROVIDER_SITE_OTHER): Payer: Self-pay

## 2020-10-31 ENCOUNTER — Telehealth: Payer: Self-pay | Admitting: Physical Therapy

## 2020-10-31 DIAGNOSIS — I5042 Chronic combined systolic (congestive) and diastolic (congestive) heart failure: Secondary | ICD-10-CM

## 2020-10-31 LAB — CUP PACEART REMOTE DEVICE CHECK
Battery Remaining Longevity: 80 mo
Battery Remaining Percentage: 78 %
Battery Voltage: 2.99 V
Brady Statistic RV Percent Paced: 1 %
Date Time Interrogation Session: 20220214192610
HighPow Impedance: 100 Ohm
HighPow Impedance: 100 Ohm
Lead Channel Impedance Value: 480 Ohm
Lead Channel Pacing Threshold Amplitude: 0.75 V
Lead Channel Pacing Threshold Pulse Width: 0.5 ms
Lead Channel Sensing Intrinsic Amplitude: 12 mV
Lead Channel Setting Pacing Amplitude: 2.5 V
Lead Channel Setting Pacing Pulse Width: 0.5 ms
Lead Channel Setting Sensing Sensitivity: 0.5 mV
Pulse Gen Serial Number: 9824800

## 2020-10-31 NOTE — Telephone Encounter (Signed)
Called pt to inform him that I received his emails w/ information concerning paperwork/forms that need to be completed for [employer] regarding a potential Workers Comp claim.  I explain to him that what he sent me were instructions for him as to how to report a leave of absence through [place] and a list of approved providers that he can see regarding said work injury.  He verbalizes understanding.  He states that he does actually have some forms that he will drop off at the front desk tomorrow.  I advise him to contact [employer] and begin the process and also to speak to someone w/ [employer] to clarify exactly what info he needs to provide to them and whether or not he needs to be seen by one of the providers on the list he provided [place] via email.  Again, pt verbalizes understanding and states that he will drop off the forms tomorrow.

## 2020-11-01 ENCOUNTER — Other Ambulatory Visit (HOSPITAL_COMMUNITY): Payer: Self-pay | Admitting: Cardiology

## 2020-11-01 ENCOUNTER — Ambulatory Visit: Payer: 59 | Admitting: Family Medicine

## 2020-11-01 ENCOUNTER — Other Ambulatory Visit: Payer: Self-pay

## 2020-11-01 ENCOUNTER — Ambulatory Visit (HOSPITAL_COMMUNITY)
Admission: RE | Admit: 2020-11-01 | Discharge: 2020-11-01 | Disposition: A | Discharge status: Home / Self Care | Payer: 59 | Source: Ambulatory Visit | Attending: Internal Medicine | Admitting: Internal Medicine

## 2020-11-01 DIAGNOSIS — I5042 Chronic combined systolic (congestive) and diastolic (congestive) heart failure: Secondary | ICD-10-CM | POA: Insufficient documentation

## 2020-11-01 LAB — BASIC METABOLIC PANEL
Anion gap: 10 (ref 5–15)
BUN: 11 mg/dL (ref 6–20)
CO2: 25 mmol/L (ref 22–32)
Calcium: 9.1 mg/dL (ref 8.9–10.3)
Chloride: 102 mmol/L (ref 98–111)
Creatinine, Ser: 0.91 mg/dL (ref 0.61–1.24)
GFR, Estimated: >60 mL/min (ref >60)
Glucose, Bld: 132 mg/dL — ABNORMAL HIGH (ref 70–99)
Potassium: 3.7 mmol/L (ref 3.5–5.1)
Sodium: 137 mmol/L (ref 135–145)

## 2020-11-01 LAB — BRAIN NATRIURETIC PEPTIDE: B Natriuretic Peptide: 1130 pg/mL — ABNORMAL HIGH (ref 0.0–100.0)

## 2020-11-02 ENCOUNTER — Telehealth (HOSPITAL_COMMUNITY): Payer: Self-pay

## 2020-11-02 NOTE — Telephone Encounter (Signed)
-----   Message from Laurey Morale, MD sent at 11/01/2020  4:34 PM EST ----- No changes for now.  Has his breathing improved?

## 2020-11-02 NOTE — Telephone Encounter (Signed)
Samara Snide, RN  11/02/2020 1:09 PM EST Back to Top     Patient states his breathing is the same and has not changed/improved

## 2020-11-06 NOTE — Progress Notes (Signed)
Remote ICD transmission.   

## 2020-11-09 ENCOUNTER — Other Ambulatory Visit: Payer: Self-pay | Admitting: Family Medicine

## 2020-11-09 NOTE — Telephone Encounter (Signed)
Please advise 

## 2020-11-10 ENCOUNTER — Other Ambulatory Visit (HOSPITAL_COMMUNITY): Payer: Self-pay | Admitting: Cardiology

## 2020-11-15 ENCOUNTER — Ambulatory Visit: Payer: 59 | Admitting: Family Medicine

## 2020-11-15 NOTE — Progress Notes (Deleted)
   I, [name], LAT, ATC acting as a scribe for [name], MD.  Rolf Fells is a 60 y.o. male who presents to [facility] at [facility] today for continued R hip and R groin pain. Pt was last seen by Dr. Denyse Amass on 10/26/20 for R hip pain and was referred back to orthopedic surgeon at [facility]. Today, pt locates pain to   Radiating pn: LE numbness/tingling: LE weakness: Aggravates: Rx tried:  Dximaging:scheduled 2/8/22L-spine MRI 07/25/20 R hip XR and L-spine XR  Pertinent review of systems: ***  Relevant historical information: ***   Exam:  There were no vitals taken for this visit. General: Well Developed, well nourished, and in no acute distress.   MSK: ***    Lab and Radiology Results No results found for this or any previous visit (from the past 72 hour(s)). No results found.     Assessment and Plan: 60 y.o. male with ***   PDMP not reviewed this encounter. No orders of the defined types were placed in this encounter.  No orders of the defined types were placed in this encounter.    Discussed warning signs or symptoms. Please see discharge instructions. Patient expresses understanding.   ***

## 2020-11-16 ENCOUNTER — Ambulatory Visit: Payer: 59 | Admitting: Family Medicine

## 2020-11-16 NOTE — Progress Notes (Deleted)
   I, Christoper Fabian, LAT, ATC, am serving as scribe for Dr. [name].  Sal Spratley is a 60 y.o. male who presents to [facility] at [facility] today for f/u of LBP and R hip pain.  He was last seen by Dr. Denyse Amass on 10/26/20 to review his L-spine MRI but noted that his R hip pain was much more bothersome and had worsened.  He located his pain to his R ant hip and groin and was advised to f/u w/ his orthopedic surgeon Dr. Linna Caprice who performed his L THA previously.  Since his last visit, pt reports   Diagnostic testing: L-spine MRI- 10/24/20; R hip and L-spine XR- 07/25/20  Pertinent review of systems: ***  Relevant historical information: ***   Exam:  There were no vitals taken for this visit. General: Well Developed, well nourished, and in no acute distress.   MSK: ***    Lab and Radiology Results No results found for this or any previous visit (from the past 72 hour(s)). No results found.     Assessment and Plan: 60 y.o. male with ***   PDMP not reviewed this encounter. No orders of the defined types were placed in this encounter.  No orders of the defined types were placed in this encounter.    Discussed warning signs or symptoms. Please see discharge instructions. Patient expresses understanding.   ***

## 2020-11-17 ENCOUNTER — Ambulatory Visit: Payer: 59 | Admitting: Family Medicine

## 2020-11-29 ENCOUNTER — Ambulatory Visit (HOSPITAL_COMMUNITY): Payer: 59 | Admitting: Psychiatry

## 2020-12-11 ENCOUNTER — Other Ambulatory Visit: Payer: Self-pay | Admitting: Family Medicine

## 2020-12-11 NOTE — Telephone Encounter (Signed)
Please advise 

## 2020-12-11 NOTE — Telephone Encounter (Signed)
In December we placed psychiatry referral and also encouraged 3-4 month follow up with me- whats the status of these items? If not scheduled please get him scheduled for both before we refill

## 2020-12-12 ENCOUNTER — Other Ambulatory Visit: Payer: Self-pay | Admitting: Family Medicine

## 2020-12-12 NOTE — Telephone Encounter (Signed)
Please see refill request for same medicine from day prior

## 2020-12-12 NOTE — Telephone Encounter (Signed)
Misty Stanley, can you check on the status of the psych referral please?  Maddy can you get him scheduled for a f/u visit please?

## 2020-12-12 NOTE — Telephone Encounter (Signed)
Patient has been scheduled for April 21.

## 2020-12-12 NOTE — Telephone Encounter (Signed)
Last refill 11/18/2020  Last OV 10/06/2020 dx cough

## 2020-12-13 ENCOUNTER — Other Ambulatory Visit: Payer: Self-pay | Admitting: Family Medicine

## 2020-12-13 NOTE — Telephone Encounter (Signed)
Pt called following up on both prescriptions he needs refilled. Pt is scheduled for an OV on April 21st. Please advise.

## 2020-12-13 NOTE — Telephone Encounter (Signed)
Called over to [facility] to follow up on referral. Pt was scheduled for a second time for 11/29/2020. Pt called and cancelled his appointment with them and said he would call to reschedule his appointment.

## 2020-12-13 NOTE — Telephone Encounter (Signed)
Awaitin response from Lisaon the status of the psych referral being scheduled. Can you look into this please?

## 2020-12-13 NOTE — Telephone Encounter (Signed)
Please call patient in AM. I will not accept lunch from him- that could certainly be viewed as bribing to receive a controlled substance and accepting a bribe. I tried to call him tonight but he did not pick up.   I am very worried about patient taking ambien, ativan, and drinking alcohol- thus the reason for the original referral. I also asked him to schedule a follow up - the lack of refill is because he has not followed through with requested follow up with me. This is not about making money - this is about keeping him safe. Can schedule him for next available same day slot. I would also be willing to see him tomorrow VIRTUALLY at 11 40- we do not have good staffing at that time for in person but I could work in a virtual if he would like.

## 2020-12-13 NOTE — Telephone Encounter (Signed)
Patient called in demanding that medication be refilled, extremely upset that medication has not been filled since initial call on Monday. Explained to patient that PCP wanted a f/u with him and an appt with therapy scheduled before medication would be refilled. Explained to patient that we had been waiting on a response from [facility] with dates of appt.   Patient became irate asking why we would send him to an office that is not affiliated within the [facility] system, started accusing our office of just wanting to generate revenue for the healthcare system. Questioned how did we know that this office that we referred him to is legitimate and what if who he ends up seeing there isn't even licensed. States that he has contacted all of his siblings who are either lawyers or work in healthcare and that this is "just not right" and that we are withholding his medication for absolutely no reason. States that surgeon who he is seeing for back and hip issues said that Lorazepam is needed to help him control the pain he is experiencing.   I explained to patient that today is PCP half day and he had already gone home for the day when we received the information regarding appts with behavorial health. Explained that PCP would see note upon his arrival in the morning. Started yelling, asking why I could "not just page the doctor about this." I explained to patient that we do not have a pager system - he stated that I was lying to him, that he has many "doctor friends" and that he knows this is all about the money.  States that he will be coming into the office tomorrow to talk to Dr. Durene Cal "for just two minutes" and that he is going to bring him lunch.   Advised that PCP has a full schedule tomorrow and to not come into office, that we will contact after we get written response from PCP. States he will be coming in this office tomorrow.

## 2020-12-13 NOTE — Telephone Encounter (Signed)
See below

## 2020-12-14 ENCOUNTER — Telehealth (INDEPENDENT_AMBULATORY_CARE_PROVIDER_SITE_OTHER): Payer: 59 | Admitting: Family Medicine

## 2020-12-14 ENCOUNTER — Other Ambulatory Visit: Payer: Self-pay

## 2020-12-14 ENCOUNTER — Encounter: Payer: Self-pay | Admitting: Family Medicine

## 2020-12-14 DIAGNOSIS — F411 Generalized anxiety disorder: Secondary | ICD-10-CM | POA: Diagnosis not present

## 2020-12-14 DIAGNOSIS — F1021 Alcohol dependence, in remission: Secondary | ICD-10-CM | POA: Diagnosis not present

## 2020-12-14 DIAGNOSIS — F5101 Primary insomnia: Secondary | ICD-10-CM | POA: Diagnosis not present

## 2020-12-14 MED ORDER — LORAZEPAM 1 MG PO TABS: 1.0000 mg | ORAL_TABLET | Freq: Every day | ORAL | 1 refills | Status: DC | PRN

## 2020-12-14 NOTE — Progress Notes (Signed)
Phone [phone] Virtual visit via Video note   Subjective:  Chief complaint: Chief Complaint  Patient presents with  . Follow-up    Medication refill.    This visit type was conducted due to national recommendations for restrictions regarding the COVID-19 Pandemic (e.g. social distancing).  This format is felt to be most appropriate for this patient at this time balancing risks to patient and risks to population by having him in for in person visit.  No physical exam was performed (except for noted visual exam or audio findings with Telehealth visits).    Our team/I connected with [name] at 11:40 AM EDT by a video enabled telemedicine application (doxy.me or caregility through epic) and verified that I am speaking with the correct person using two identifiers.  Location patient: [place] Location provider: [facility], office Persons participating in the virtual visit:  patient  Our team/I discussed the limitations of evaluation and management by telemedicine and the availability of in person appointments. In light of current covid-19 pandemic, patient also understands that we are trying to protect them by minimizing in office contact if at all possible.  The patient expressed consent for telemedicine visit and agreed to proceed. Patient understands insurance will be billed.   Past Medical History-  Patient Active Problem List   Diagnosis Date Noted  . Paroxysmal atrial fibrillation (HCC) 04/14/2020    Priority: High  . S/P ICD (internal cardiac defibrillator) procedure 04/14/2020    Priority: High  . Status post reverse total shoulder replacement, left 11/11/2019    Priority: High  . Non-ischemic cardiomyopathy (HCC)     Priority: High  . Chronic combined systolic and diastolic CHF, NYHA class 3 (HCC) 10/2016    Priority: High  . Alcohol dependence in remission (HCC) 03/19/2016    Priority: High  . GAD (generalized anxiety disorder) 05/31/2020    Priority: Medium  .  Hyperlipidemia 05/27/2018    Priority: Medium  . Essential hypertension 11/08/2016    Priority: Medium  . GI bleed 03/19/2016    Priority: Medium  . ED (erectile dysfunction) 08/21/2015    Priority: Medium  . Hyperglycemia 08/21/2015    Priority: Medium  . Insomnia 08/21/2015    Priority: Medium  . Sleep apnea 08/21/2015    Priority: Medium  . Unspecified fracture of upper end of left humerus, initial encounter for closed fracture 09/03/2017    Priority: Low  . S/P shoulder replacement 11/24/2015    Priority: Low  . Allergic rhinitis 08/21/2015    Priority: Low  . Degenerative joint disease of left hip 03/27/2015    Priority: Low    Medications- reviewed and updated Current Outpatient Medications  Medication Sig Dispense Refill  . acetaminophen (TYLENOL) 650 MG CR tablet Take 650 mg by mouth every 8 (eight) hours as needed for pain.    . carvedilol (COREG) 25 MG tablet TAKE 1 TABLET (25 MG TOTAL) BY MOUTH 2 (TWO) TIMES DAILY WITH A MEAL. 180 tablet 1  . ELIQUIS 5 MG TABS tablet TAKE 1 TABLET BY MOUTH TWICE A DAY 180 tablet 3  . ENTRESTO 97-103 MG TAKE 1 TABLET BY MOUTH 2 (TWO) TIMES DAILY. 60 tablet 5  . furosemide (LASIX) 40 MG tablet Take 1.5 tablets (60 mg total) by mouth every morning AND 1.5 tablets (60 mg total) every evening. 270 tablet 3  . Multiple Vitamins-Minerals (MULTIVITAMIN WITH MINERALS) tablet Take 1 tablet by mouth daily.    . rosuvastatin (CRESTOR) 20 MG tablet TAKE 1 TABLET BY  MOUTH EVERY DAY 30 tablet 11  . diclofenac Sodium (VOLTAREN) 1 % GEL Apply 4 g topically 4 (four) times daily. To affected joint. (Patient not taking: Reported on 12/14/2020) 100 g 11  . FARXIGA 10 MG TABS tablet TAKE 1 TABLET BY MOUTH DAILY BEFORE BREAKFAST (Patient not taking: Reported on 12/14/2020) 30 tablet 11  . LORazepam (ATIVAN) 1 MG tablet Take 1 tablet (1 mg total) by mouth daily as needed for anxiety (Stopping ambien. no hydrocodone within 8 hours. no driving within 8 hours.). 30  tablet 1  . pregabalin (LYRICA) 75 MG capsule Take 1 capsule (75 mg total) by mouth 2 (two) times daily as needed. (Patient not taking: Reported on 12/14/2020) 60 capsule 3  . sildenafil (VIAGRA) 100 MG tablet TAKE 0.5 TABLETS (50 MG TOTAL) BY MOUTH AT BEDTIME AS NEEDED FOR ERECTILE DYSFUNCTION. (Patient not taking: Reported on 12/14/2020) 10 tablet 3  . spironolactone (ALDACTONE) 25 MG tablet TAKE 1 TABLET BY MOUTH EVERY DAY (Patient not taking: Reported on 12/14/2020) 30 tablet 11  . tiZANidine (ZANAFLEX) 4 MG tablet TAKE 1 TABLET BY MOUTH EVERY 6 HOURS AS NEEDED FOR MUSCLE SPASMS. (Patient not taking: Reported on 12/14/2020) 60 tablet 1  . VASCEPA 1 g capsule TAKE 2 CAPSULES (2 G TOTAL) BY MOUTH 2 (TWO) TIMES DAILY. (Patient not taking: Reported on 12/14/2020) 120 capsule 3   No current facility-administered medications for this visit.     Objective:  no self reported vitals Gen: NAD, resting comfortably Lungs: nonlabored, normal respiratory rate  Skin: appears dry, no obvious rash     Assessment and Plan   #Insomnia/anxiety/GAD  S: Patient reports he had weaned himself off of lorazepam for some time but recently has noticed worsening of anxiety approaching his upcoming hip surgery with Dr. Linna Caprice.  He also weaned himself off of Lexapro because he did not want to like "mind drugs".  We discussed that lorazepam is a riskier medication with higher rates of dependence than Lexapro.  Previously I had referred patient to psychiatry due to poor control of anxiety the patient states overall has been doing better up until surgery.  He is very hesitant to see a therapist as apparently his sister is a therapist and has had a lot of mental health issues and may even be in jail per his report-makes him very leery  I also have been very concerned about patient's alcohol intake in the past.  He has only had 2 beers and St. Patrick's Day and states has limited alcohol to 2 packs/day on any day since last  visit 4 months ago  Support: good friend Dr. Nolen Mu very supportive, supportive family, spends time with parish priest  Unfortunately patient's risk with lorazepam with Ambien prescription as well as recent hydrocodone prescription from orthopedics.  We reviewed PDMP and patient refilled lorazepam on 11/18/2020 as well as Ambien and also has had hydrocodone refill and has 10 pills remaining.  He reports he is okay with not taking Ambien-lorazepam helps him rest better as a lot of his sleep issues are anxiety related.  He is also stopped taking trazodone  He is also been using Tylenol for pain-we discussed limiting acetaminophen/Tylenol to 3000 mg/day   A/P: 60 year old male with history of anxiety and insomnia.  Had weaned himself off of Lexapro, lorazepam, Ambien per his report but recently as approaching hip surgery has had increased issues.  Thankfully alcohol dependence appears to be in remission-recommended avoiding alcohol completely but it does drink max 2/day  and cannot be on days he takes lorazepam or hydrocodone as below   He request a refill lorazepam.  We discussed several things -He stopped trazodone -He stopped Lexapro-I told him I had greater concern about lorazepam -He agrees to stopping Ambien -Max alcohol per day 2/day -Knows he should not drink alcohol if takes lorazepam or hydrocodone on a day -Do not drive for 8 hours after taking lorazepam.  Obviously he should avoid hydrocodone as well for same.. -For hydrocodone and lorazepam was separated by 8 hours -Refilled lorazepam No. 30 with 1 refill with goal for this to last 2 months until his follow-up which she will reschedule to 2 months out -We discussed since he had previously weaned himself off lorazepam and he would only be taking once a day I am fine weaning off again after these 2 months  Also with poor control of anxiety and his hesitancy with Lexapro will refer back to psychiatry for their opinion.  I do think patient  would benefit from therapy but he is not ready to do this at this time-only wants to see MD or DO  Recommended follow up: Approximately 2 months-he should call back to reschedule April 21 visit Future Appointments  Date Time Provider Department Center  01/16/2021  2:00 PM [facility] ECHO OP 1 [facility]  01/16/2021  3:00 PM Laurey Morale, MD [facility] None  01/30/2021  7:00 AM [facility] DEVICE REMOTES [facility]  03/22/2021 11:20 AM Shelva Majestic, MD [facility]  05/01/2021  7:00 AM [facility] DEVICE REMOTES [facility]  07/31/2021  7:00 AM [facility] DEVICE REMOTES [facility]  10/30/2021  7:00 AM [facility] DEVICE REMOTES [facility]  01/29/2022  7:00 AM [facility] DEVICE REMOTES [facility]  04/30/2022  7:00 AM [facility] DEVICE REMOTES [facility]  07/30/2022  7:00 AM [facility] DEVICE REMOTES [facility]    Lab/Order associations:   ICD-10-CM   1. Primary insomnia  F51.01 Ambulatory referral to Psychiatry  2. Alcohol dependence in remission [facility])  F10.21 Ambulatory referral to Psychiatry  3. GAD (generalized anxiety disorder)  F41.1 Ambulatory referral to Psychiatry    Meds ordered this encounter  Medications  . LORazepam (ATIVAN) 1 MG tablet    Sig: Take 1 tablet (1 mg total) by mouth daily as needed for anxiety (Stopping ambien. no hydrocodone within 8 hours. no driving within 8 hours.).    Dispense:  30 tablet    Refill:  1    Time Spent: 35 minutes of total time (12:00 PM- 12:35 PM) was spent on the date of the encounter performing the following actions: chart review prior to seeing the patient, obtaining history, performing a medically necessary exam, counseling on the treatment plan, placing orders, and documenting in our EHR.   Return precautions advised.  [name], MD

## 2020-12-14 NOTE — Patient Instructions (Addendum)
Health Maintenance Due  Topic Date Due  . COLONOSCOPY (Pts 45-24yrs Insurance coverage will need to be confirmed) Discuss.  Never done  . COVID-19 Vaccine (3 - Booster for [employer] series) States that he has had it no dates.  11/16/2020      Recommended follow up: Return in about 2 months (around 02/13/2021).

## 2020-12-14 NOTE — Telephone Encounter (Signed)
Called patient at the preferred phone number, no answer. Left generic message for pt to call the office back to discuss his refill request.

## 2020-12-14 NOTE — Telephone Encounter (Signed)
Called and lm for pt tcb, will try again in 10 min.

## 2020-12-14 NOTE — Telephone Encounter (Signed)
Patient is requesting a refill of the following medications: Requested Prescriptions   Pending Prescriptions Disp Refills  . zolpidem (AMBIEN) 10 MG tablet [Pharmacy Med Name: ZOLPIDEM TARTRATE 10 MG TABLET] 30 tablet     Sig: TAKE 1 TABLET BY MOUTH AT BEDTIME AS NEEDED FOR SLEEP.    Date of patient request: 12/13/2020 Last office visit: 10/06/2020 video visit Date of last refill: 05/31/2020 Last refill amount: 30 tablets 5 refills  Follow up time period per chart:01/04/2021

## 2020-12-21 ENCOUNTER — Telehealth: Payer: Self-pay

## 2020-12-21 NOTE — Telephone Encounter (Signed)
Spoke to the patient and advised him to call [facility] to get scheduled as they have been trying to reach him without success-patient has agreed to do this and I gave him the phone #  He also reported he has jock itch that has been going on for a coupe of days and is in need of an Rx for this since the OTC medications do not work-he would like for this to go to the [facility] on [address]-please advise

## 2020-12-21 NOTE — Telephone Encounter (Signed)
Pt believes he has " jock itch". I was unable to tell if he was saying he talked to Dr. Durene Cal about it or not. He is requesting a cream for it. It sounded as if he was saying " quarry" is a cream he used before.

## 2020-12-21 NOTE — Telephone Encounter (Signed)
Team please schedule him a visit to discuss jock itch/groin rash -In the meantime if he wants to use over-the-counter medicines he needs to make sure to use them consistently twice a day-I would do once after shower and would at least try off before second application of the day.

## 2020-12-22 NOTE — Telephone Encounter (Signed)
Pt is returning call.  

## 2020-12-22 NOTE — Telephone Encounter (Signed)
Appointment has been scheduled for this patient.

## 2020-12-22 NOTE — Telephone Encounter (Signed)
Please advise. Did call patient unable to reach him to clarify more. Still wanted to send this to you just incase you and him already had a conversation

## 2020-12-22 NOTE — Telephone Encounter (Signed)
I do not recall talking about this at last visit and it was a virtual so I would not have been able to examine him-worth an in person visit/examination-I am okay with this being with another provider if he is comfortable

## 2020-12-25 ENCOUNTER — Ambulatory Visit: Payer: 59 | Admitting: Family Medicine

## 2020-12-26 ENCOUNTER — Other Ambulatory Visit (HOSPITAL_COMMUNITY): Payer: Self-pay | Admitting: Physical Medicine and Rehabilitation

## 2020-12-26 DIAGNOSIS — M545 Low back pain, unspecified: Secondary | ICD-10-CM

## 2020-12-26 DIAGNOSIS — M5459 Other low back pain: Secondary | ICD-10-CM

## 2021-01-04 ENCOUNTER — Ambulatory Visit: Payer: 59 | Admitting: Family Medicine

## 2021-01-16 ENCOUNTER — Other Ambulatory Visit: Payer: Self-pay

## 2021-01-16 ENCOUNTER — Encounter (HOSPITAL_COMMUNITY): Payer: Self-pay | Admitting: Cardiology

## 2021-01-16 ENCOUNTER — Ambulatory Visit (HOSPITAL_BASED_OUTPATIENT_CLINIC_OR_DEPARTMENT_OTHER)
Admission: RE | Admit: 2021-01-16 | Discharge: 2021-01-16 | Disposition: A | Discharge status: Home / Self Care | Payer: 59 | Source: Ambulatory Visit | Attending: Cardiology | Admitting: Cardiology

## 2021-01-16 ENCOUNTER — Ambulatory Visit (HOSPITAL_COMMUNITY)
Admission: RE | Admit: 2021-01-16 | Discharge: 2021-01-16 | Disposition: A | Discharge status: Home / Self Care | Payer: 59 | Source: Ambulatory Visit | Attending: Cardiology | Admitting: Cardiology

## 2021-01-16 VITALS — BP 100/60 | HR 75 | Wt 266.6 lb

## 2021-01-16 DIAGNOSIS — I5042 Chronic combined systolic (congestive) and diastolic (congestive) heart failure: Secondary | ICD-10-CM | POA: Diagnosis not present

## 2021-01-16 DIAGNOSIS — F419 Anxiety disorder, unspecified: Secondary | ICD-10-CM | POA: Diagnosis not present

## 2021-01-16 DIAGNOSIS — Z7984 Long term (current) use of oral hypoglycemic drugs: Secondary | ICD-10-CM | POA: Diagnosis not present

## 2021-01-16 DIAGNOSIS — Z79899 Other long term (current) drug therapy: Secondary | ICD-10-CM | POA: Diagnosis not present

## 2021-01-16 DIAGNOSIS — I428 Other cardiomyopathies: Secondary | ICD-10-CM | POA: Diagnosis not present

## 2021-01-16 DIAGNOSIS — Z791 Long term (current) use of non-steroidal anti-inflammatories (NSAID): Secondary | ICD-10-CM | POA: Diagnosis not present

## 2021-01-16 DIAGNOSIS — Z7901 Long term (current) use of anticoagulants: Secondary | ICD-10-CM | POA: Diagnosis not present

## 2021-01-16 DIAGNOSIS — I5082 Biventricular heart failure: Secondary | ICD-10-CM | POA: Insufficient documentation

## 2021-01-16 DIAGNOSIS — Z95810 Presence of automatic (implantable) cardiac defibrillator: Secondary | ICD-10-CM | POA: Insufficient documentation

## 2021-01-16 DIAGNOSIS — I480 Paroxysmal atrial fibrillation: Secondary | ICD-10-CM | POA: Insufficient documentation

## 2021-01-16 DIAGNOSIS — F1010 Alcohol abuse, uncomplicated: Secondary | ICD-10-CM | POA: Diagnosis not present

## 2021-01-16 DIAGNOSIS — G4733 Obstructive sleep apnea (adult) (pediatric): Secondary | ICD-10-CM | POA: Diagnosis not present

## 2021-01-16 LAB — ECHOCARDIOGRAM COMPLETE
AR max vel: 2.14 cm2
AV Area VTI: 1.96 cm2
AV Area mean vel: 1.86 cm2
AV Mean grad: 4 mmHg
AV Peak grad: 7.2 mmHg
Ao pk vel: 1.34 m/s
Area-P 1/2: 2.92 cm2
Calc EF: 38.6 %
S' Lateral: 4.7 cm
Single Plane A2C EF: 50.3 %
Single Plane A4C EF: 25.7 %

## 2021-01-16 LAB — BASIC METABOLIC PANEL
Anion gap: 12 (ref 5–15)
BUN: 9 mg/dL (ref 6–20)
CO2: 25 mmol/L (ref 22–32)
Calcium: 9.3 mg/dL (ref 8.9–10.3)
Chloride: 94 mmol/L — ABNORMAL LOW (ref 98–111)
Creatinine, Ser: 0.96 mg/dL (ref 0.61–1.24)
GFR, Estimated: >60 mL/min (ref >60)
Glucose, Bld: 102 mg/dL — ABNORMAL HIGH (ref 70–99)
Potassium: 3.4 mmol/L — ABNORMAL LOW (ref 3.5–5.1)
Sodium: 131 mmol/L — ABNORMAL LOW (ref 135–145)

## 2021-01-16 MED ORDER — PERFLUTREN LIPID MICROSPHERE
1.0000 mL | INTRAVENOUS | Status: DC | PRN
Start: 2021-01-16 — End: 2021-01-16
  Administered 2021-01-16: 2 mL via INTRAVENOUS
  Filled 2021-01-16: qty 10

## 2021-01-16 MED ORDER — TORSEMIDE 20 MG PO TABS: 60.0000 mg | ORAL_TABLET | Freq: Every day | ORAL | 3 refills | Status: DC

## 2021-01-16 NOTE — Patient Instructions (Signed)
STOP Lasix START Torsemide 60 mg (3 tabs) daily  Labs today We will only contact you if something comes back abnormal or we need to make some changes. Otherwise no news is good news!  Labs needed in 7-10 days   Be sure to use your CPAP every night  Your physician recommends that you schedule a follow-up appointment in: 3 weeks  in the Advanced Practitioners (PA/NP) Clinic   Do the following things EVERYDAY: 1) Weigh yourself in the morning before breakfast. Write it down and keep it in a log. 2) Take your medicines as prescribed 3) Eat low salt foods--Limit salt (sodium) to 2000 mg per day.  4) Stay as active as you can everyday 5) Limit all fluids for the day to less than 2 liters  At the [facility], you and your health needs are our priority. As part of our continuing mission to provide you with exceptional heart care, we have created designated Provider Care Teams. These Care Teams include your primary Cardiologist (physician) and Advanced Practice Providers (APPs- Physician Assistants and Nurse Practitioners) who all work together to provide you with the care you need, when you need it.   You may see any of the following providers on your designated Care Team at your next follow up: Marland Kitchen Dr Arvilla Meres . Dr Marca Ancona . Dr Thornell Mule . Tonye Becket, NP . Robbie Lis, PA . Shanda Bumps [place],NP . Karle Plumber, PharmD   Please be sure to bring in all your medications bottles to every appointment.   If you have any questions or concerns before your next appointment please send [place] a message through [place] or call our office at [phone].    TO LEAVE A MESSAGE FOR THE NURSE SELECT OPTION 2, PLEASE LEAVE A MESSAGE INCLUDING: . YOUR NAME . DATE OF BIRTH . CALL BACK NUMBER . REASON FOR CALL**this is important as we prioritize the call backs  YOU WILL RECEIVE A CALL BACK THE SAME DAY AS LONG AS YOU CALL BEFORE 4:00 PM

## 2021-01-16 NOTE — Progress Notes (Signed)
  Echocardiogram 2D Echocardiogram has been performed.  Roosvelt Maser F 01/16/2021, 3:15 PM

## 2021-01-17 ENCOUNTER — Telehealth (HOSPITAL_COMMUNITY): Payer: Self-pay

## 2021-01-17 MED ORDER — POTASSIUM CHLORIDE CRYS ER 20 MEQ PO TBCR: 20.0000 meq | EXTENDED_RELEASE_TABLET | Freq: Every day | ORAL | 3 refills | Status: DC

## 2021-01-17 NOTE — Telephone Encounter (Signed)
Patient advised and verbalized understanding. New Rx sent into patients pharmacy.  Meds ordered this encounter  Medications  . potassium chloride SA (KLOR-CON) 20 MEQ tablet    Sig: Take 1 tablet (20 mEq total) by mouth daily.    Dispense:  90 tablet    Refill:  3

## 2021-01-17 NOTE — Telephone Encounter (Signed)
-----   Message from Laurey Morale, MD sent at 01/17/2021  1:11 PM EDT ----- Increase total daily K by 20 mEq

## 2021-01-17 NOTE — Progress Notes (Signed)
PCP: Dr. Nolen Mu Cardiology: Dr. Allyson Sabal HF Cardiology: Dr. Shirlee Latch  60 y.o. with history of nonischemic cardiomyopathy and OSA was referred by Dr. Allyson Sabal for evaluation of CHF.  He was initially diagnosed in early 2/18.  He had URI symptoms then developed dyspnea and put on weight. Echo was done in 2/18, showing EF 20-25%.  LHC in 2/18 showed normal coronaries. He was started on treatment for cardiomyopathy, and EF rose to 40% by 6/18. However, echo in 6/19 showed EF back down to 15-20% with severe RV dilation/dysfunction.  CPX was done in 7/19, showed moderate functional limitation though submaximal.  He had St Jude ICD placed in 8/19.   Atrial fibrillation noted in 12/20, he had TEE-guided DCCV in 12/20.  He remains in NSR today and has not felt palpitations.  EF on TEE was 25-30%.   He has been a heavy drinker but has cut back.  Still drinking on occasion, however.   He had left shoulder replacement in 2/21.  He developed a surgical site infection that is now resolved.    Echo was done today and reviewed, EF 25-30%, mild LV dilation, normal RV.   Patient returns for followup of CHF.  Weight is down 4 lbs.  Not using CPAP.  He is limited by hip pain, unable to complete cardiac rehab because of this.  Seeing an orthopedic surgeon about THR.  No dyspnea walking on flat ground.  Gets short of breath (and hip pain) with stairs.  No orthopnea/PND.  Still drinking at least a 6-pack of beer daily.  ECG (personally reviewed): NSR, lateral TWIs  St Jude device interrogation: thoracic impedance decreased recently.   Labs (7/19): K 4.9, creatinine 0.91 Labs (8/19): Na 128, K 4.9, creatinine 0.72 Labs (9/19): LDL 45 Labs (12/19): K 3.9, creatinine 0.94  Labs (1/20): K 4.5, creatinine 0.94, hgb 13.2 Labs (10/20): K 4.4, creatinine 0.99 Labs (12/20): K 4.6, creatinine 1.0 Labs (2/21): K 3.9, creatinine 0.86 Labs (4/21): K 4.9, creatinine 1.05 Labs (6/21): K 4, creatinine 0.94 Labs (12/21): K 4.6,  creatinine 1.0, LFTs normal, hgb 14.1, TGs 163, LDL 71 Labs (2/22): K 3.34m, creatinine 0.91, BNP 1130  PMH: 1. OSA: uses CPAP.  2. Depression 3. Chronic systolic CHF: Nonischemic cardiomyopathy.  Diagnosed around 2/18.  - LHC (2/18): Normal coronaries.  - Echo (2/18): EF 20-25% - Echo (6/18): EF 40% - Echo (12/18): EF 35-40% - Echo (6/19): LV mildly dilated, EF 15-20%, RV severely dilated with severely decreased systolic function.  - CPX (7/19): peak VO2 16, VE/VCO2 slope 37, RER 1.03 => submaximal but probably moderate functional limitation.  - Cardiac MRI (7/19): EF 29% with mild LV dilation, mildly decreased RV systolic function EF 40%, mid-wall LGE throughtout the septal wall and the inferor wall.  - St Jude ICD placed in 8/19.  - Echo (12/19): EF 25-30%, diffuse hypokinesis, normal RV size and systolic function.  - Echo (12/20): EF 25-30%, moderate LVH, normal RV size and systolic function.  - TEE (12/20): EF 25-30%, mildly decreased RV systolic function.  - Echo (4/22): EF 25-30%, mild LV dilation, normal RV. 4. ETOH abuse.  5. Atrial fibrillation: First noted in 12/20.  - DCCV to NSR in 12/20.  6. 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 No drugs. Lives in [place].   FH: Sister with congenital heart abnormality.  No other cardiac disease.   ROS: All systems reviewed and negative except  as per HPI.  Current Outpatient Medications  Medication Sig Dispense Refill  . acetaminophen (TYLENOL) 650 MG CR tablet Take 650 mg by mouth every 8 (eight) hours as needed for pain.    . carvedilol (COREG) 25 MG tablet TAKE 1 TABLET (25 MG TOTAL) BY MOUTH 2 (TWO) TIMES DAILY WITH A MEAL. 180 tablet 1  . diclofenac Sodium (VOLTAREN) 1 % GEL Apply 4 g topically 4 (four) times daily. To affected joint. 100 g 11  . ELIQUIS 5 MG TABS tablet TAKE 1 TABLET BY MOUTH TWICE A DAY 180 tablet 3  . ENTRESTO 97-103 MG TAKE 1  TABLET BY MOUTH 2 (TWO) TIMES DAILY. 60 tablet 5  . FARXIGA 10 MG TABS tablet TAKE 1 TABLET BY MOUTH DAILY BEFORE BREAKFAST 30 tablet 11  . LORazepam (ATIVAN) 1 MG tablet Take 1 tablet (1 mg total) by mouth daily as needed for anxiety (Stopping ambien. no hydrocodone within 8 hours. no driving within 8 hours.). 30 tablet 1  . Multiple Vitamins-Minerals (MULTIVITAMIN WITH MINERALS) tablet Take 1 tablet by mouth daily.    . rosuvastatin (CRESTOR) 20 MG tablet TAKE 1 TABLET BY MOUTH EVERY DAY 30 tablet 11  . sildenafil (VIAGRA) 100 MG tablet TAKE 0.5 TABLETS (50 MG TOTAL) BY MOUTH AT BEDTIME AS NEEDED FOR ERECTILE DYSFUNCTION. 10 tablet 3  . spironolactone (ALDACTONE) 25 MG tablet TAKE 1 TABLET BY MOUTH EVERY DAY 30 tablet 11  . torsemide (DEMADEX) 20 MG tablet Take 3 tablets (60 mg total) by mouth daily. 90 tablet 3  . potassium chloride SA (KLOR-CON) 20 MEQ tablet Take 1 tablet (20 mEq total) by mouth daily. 90 tablet 3   No current facility-administered medications for this encounter.   BP 100/60   Pulse 75   Wt 120.9 kg (266 lb 9.6 oz)   SpO2 97%   BMI 38.25 kg/m  General: NAD, obese Neck: JVP 8 cm with thick neck, no thyromegaly or thyroid nodule.  Lungs: Clear to auscultation bilaterally with normal respiratory effort. CV: Nondisplaced PMI.  Heart regular S1/S2, no S3/S4, no murmur.  No peripheral edema.  No carotid bruit.  Normal pedal pulses.  Abdomen: Soft, nontender, no hepatosplenomegaly, mild distention.  Skin: Intact without lesions or rashes.  Neurologic: Alert and oriented x 3.  Psych: Normal affect. Extremities: No clubbing or cyanosis.  HEENT: Normal.   Assessment/Plan: 1. Chronic systolic CHF: Nonischemic cardiomyopathy with biventricular failure.  Cath in 2/18 with no coronary disease.  Echo (6/19) with EF 15-20%, severe RV dilation/dysfunction.  CPX 7/19 submaximal but probably moderate functional limitation.  Cardiac MRI in 7/19 showed EF 29%.  LGE pattern was  concerning for prior viral myocarditis. Echo in 12/20 showed stable EF 25-30%.  Now with St Jude ICD.  Cause of CMP suspected to be viral myocarditis versus ETOH.  No family history of cardiomyopathy.  NYHA class II symptoms. He is volume overloaded by exam and Corvue today.  He is still drinking at least a 6-pack of beer/day.   - Stop Lasix, start torsemide 60 mg daily.  BMET today and in 10 days.  - Continue dapagliflozin 10 mg daily.  - Continue spironolactone 25 mg daily.  BMET today.  - Continue Coreg 25 mg bid. - Continue Entresto 97/103 bid.  - He needs to stop ETOH => suspect this is either the cause or a contributor to his cardiomyopathy.    - CPX in future, should have TKR first and recover from this.  2. OSA: Needs  to use CPAP.  3. Anxiety: He is on sertraline.  4. ETOH Abuse: This is likely causing/contributing to his cardiomyopathy and likely played a role in triggering atrial fibrillation.  Still drinking at least a 6-pack/day.  - Discussed cessation, he is going to make an effort.  5. Atrial fibrillation: Paroxysmal, remains in NSR after DCCV in 12/20.  - Continue Eliquis 5 mg bid.   - Cut back on ETOH.   - If atrial fibrillation returns off ETOH, consider ablation.  6. OA: Needs TKR.  Needs to have CHF under good control and cut back on ETOH prior.   Followup in 1 month with APP to reassess volume.    Marca Ancona 01/17/2021

## 2021-01-18 ENCOUNTER — Other Ambulatory Visit: Payer: Self-pay

## 2021-01-18 ENCOUNTER — Ambulatory Visit (HOSPITAL_COMMUNITY)
Admission: RE | Admit: 2021-01-18 | Discharge: 2021-01-18 | Disposition: A | Discharge status: Home / Self Care | Payer: 59 | Source: Ambulatory Visit | Attending: Physical Medicine and Rehabilitation | Admitting: Physical Medicine and Rehabilitation

## 2021-01-18 DIAGNOSIS — M5459 Other low back pain: Secondary | ICD-10-CM | POA: Diagnosis present

## 2021-01-18 DIAGNOSIS — M545 Low back pain, unspecified: Secondary | ICD-10-CM | POA: Insufficient documentation

## 2021-01-18 NOTE — Progress Notes (Signed)
[facility] today at [facility] for [facility] lumbar spine wo contrast. Patient has St. Jude ICD. Transmission sent to [place]- Rep. Cleared diagnostics and ran lead impendence tests. Programmed ICD off, tachy therapies off and pacing off and single coil only per rep recommendations. Will reprogram once scan is completed.

## 2021-01-25 ENCOUNTER — Telehealth: Payer: Self-pay

## 2021-01-25 ENCOUNTER — Other Ambulatory Visit: Payer: Self-pay | Admitting: Family Medicine

## 2021-01-25 NOTE — Telephone Encounter (Signed)
Rx refill request approved per Dr. Corey's orders. 

## 2021-01-25 NOTE — Telephone Encounter (Signed)
Called and lm for pt tcb. 

## 2021-01-25 NOTE — Telephone Encounter (Signed)
Patient has dropped off surgery clarence form to be completed.    States Dr. Durene Cal referred patient to Dr. Kandee Keen.  Dr. Kandee Keen is now referring him to [facility] for outpatient surgery.     Please advise if patient still needs appt with Dr. Durene Cal.   I have placed form in Dr. Pamala Hurry folder up front.

## 2021-01-25 NOTE — Telephone Encounter (Signed)
Does he need to keep his f/u visit with you on 07/07?

## 2021-01-25 NOTE — Telephone Encounter (Signed)
Yes he should keep this visit.  In regards to surgical clearance-I likely can clear him medically but will likely need cardiology clearance as well-I will review the form and submit

## 2021-01-26 ENCOUNTER — Ambulatory Visit (HOSPITAL_COMMUNITY)
Admission: RE | Admit: 2021-01-26 | Discharge: 2021-01-26 | Disposition: A | Discharge status: Home / Self Care | Payer: 59 | Source: Ambulatory Visit | Attending: Cardiology | Admitting: Cardiology

## 2021-01-26 ENCOUNTER — Other Ambulatory Visit: Payer: Self-pay

## 2021-01-26 DIAGNOSIS — I5042 Chronic combined systolic (congestive) and diastolic (congestive) heart failure: Secondary | ICD-10-CM | POA: Diagnosis present

## 2021-01-26 LAB — BASIC METABOLIC PANEL
Anion gap: 11 (ref 5–15)
BUN: 14 mg/dL (ref 6–20)
CO2: 28 mmol/L (ref 22–32)
Calcium: 8.9 mg/dL (ref 8.9–10.3)
Chloride: 94 mmol/L — ABNORMAL LOW (ref 98–111)
Creatinine, Ser: 0.89 mg/dL (ref 0.61–1.24)
GFR, Estimated: >60 mL/min (ref >60)
Glucose, Bld: 128 mg/dL — ABNORMAL HIGH (ref 70–99)
Potassium: 3.5 mmol/L (ref 3.5–5.1)
Sodium: 133 mmol/L — ABNORMAL LOW (ref 135–145)

## 2021-01-26 NOTE — Telephone Encounter (Signed)
Pt called following up on his paperwork for his procedure. Please advise.

## 2021-01-26 NOTE — Telephone Encounter (Signed)
Patient states that he isn't coming to the office visit on 7/7 his cardiologist is going to clear him for surgery.

## 2021-01-26 NOTE — Telephone Encounter (Signed)
Patient states it there anyway that he can get an appointment next week so that he can get his medical clearance so that he can get his procedure done prior to 7/7 they already have his procedure scheduled.

## 2021-01-26 NOTE — Telephone Encounter (Signed)
I have not seen the form yet-if cardiology is clearing him I can likely clear from a medical perspective.  Please let me see the form.  I would still recommend keeping visit on July 7 for regular follow-up

## 2021-01-29 ENCOUNTER — Telehealth (HOSPITAL_COMMUNITY): Payer: Self-pay | Admitting: *Deleted

## 2021-01-29 NOTE — Telephone Encounter (Signed)
Received forms from [facility], pt needs clearance for T12 kyphoplasty  Per Dr Shirlee Latch: pt is cleared for surgery  Note faxed back to them at [phone] [facility]  Left message for pt this has been done

## 2021-01-30 ENCOUNTER — Ambulatory Visit (INDEPENDENT_AMBULATORY_CARE_PROVIDER_SITE_OTHER): Payer: Self-pay

## 2021-01-30 DIAGNOSIS — I428 Other cardiomyopathies: Secondary | ICD-10-CM

## 2021-01-31 NOTE — Telephone Encounter (Signed)
Dr. Durene Cal received form and completed it and was faxed.

## 2021-02-01 LAB — CUP PACEART REMOTE DEVICE CHECK
Battery Remaining Longevity: 78 mo
Battery Remaining Percentage: 76 %
Battery Voltage: 2.99 V
Brady Statistic RV Percent Paced: 1 %
Date Time Interrogation Session: 20220519020020
HighPow Impedance: 93 Ohm
HighPow Impedance: 93 Ohm
Lead Channel Impedance Value: 490 Ohm
Lead Channel Pacing Threshold Amplitude: 0.75 V
Lead Channel Pacing Threshold Pulse Width: 0.5 ms
Lead Channel Sensing Intrinsic Amplitude: 11.8 mV
Lead Channel Setting Pacing Amplitude: 2.5 V
Lead Channel Setting Pacing Pulse Width: 0.5 ms
Lead Channel Setting Sensing Sensitivity: 0.5 mV
Pulse Gen Serial Number: 9824800

## 2021-02-01 NOTE — Telephone Encounter (Signed)
LVM to let the patient know medical clearance forms has been filled out and faxed to the office. Patient will not need the the appt for 02/05/21.

## 2021-02-05 ENCOUNTER — Ambulatory Visit: Payer: 59 | Admitting: Family Medicine

## 2021-02-07 ENCOUNTER — Ambulatory Visit: Payer: Self-pay | Admitting: Orthopedic Surgery

## 2021-02-12 NOTE — Progress Notes (Signed)
PCP: Dr. Nolen Mu Cardiology: Dr. Allyson Sabal HF Cardiology: Dr. Shirlee Latch  60 y.o. with history of nonischemic cardiomyopathy and OSA was referred by Dr. Allyson Sabal for evaluation of CHF.  He was initially diagnosed in early 2/18.  He had URI symptoms then developed dyspnea and put on weight. Echo was done in 2/18, showing EF 20-25%.  LHC in 2/18 showed normal coronaries. He was started on treatment for cardiomyopathy, and EF rose to 40% by 6/18. However, echo in 6/19 showed EF back down to 15-20% with severe RV dilation/dysfunction.  CPX was done in 7/19, showed moderate functional limitation though submaximal.  He had St Jude ICD placed in 8/19.   Atrial fibrillation noted in 12/20, he had TEE-guided DCCV in 12/20.  He remains in NSR today and has not felt palpitations.  EF on TEE was 25-30%.   He has been a heavy drinker but has cut back.   He had left shoulder replacement in 2/21.  He developed a surgical site infection that is now resolved.    Echo 01/16/2021  EF 25-30%, mild LV dilation, normal RV.   Today he returns for HF follow up. Had mechanical fall last week. Overall feeling fair. Complaining of back and hip pain. Denies SOB/PND/Orthopnea. Appetite ok.Drinking 5-6 Gatorade a day + 6 popsicles a day. Drinking 12 beers a week.  No fever or chills. Unable to weigh at home. Taking all medications but missed torsemide for 2 days.   St Jude device interrogation: Impedance down. Suggestive of fluid accumulation for the last 12 days. .   Labs (7/19): K 4.9, creatinine 0.91 Labs (8/19): Na 128, K 4.9, creatinine 0.72 Labs (9/19): LDL 45 Labs (12/19): K 3.9, creatinine 0.94  Labs (1/20): K 4.5, creatinine 0.94, hgb 13.2 Labs (10/20): K 4.4, creatinine 0.99 Labs (12/20): K 4.6, creatinine 1.0 Labs (2/21): K 3.9, creatinine 0.86 Labs (4/21): K 4.9, creatinine 1.05 Labs (6/21): K 4, creatinine 0.94 Labs (12/21): K 4.6, creatinine 1.0, LFTs normal, hgb 14.1, TGs 163, LDL 71 Labs (2/22): K 3.74m,  creatinine 0.91, BNP 1130 Labs 01/26/21: K 3.5 Creatinine 0.89   PMH: 1. OSA: uses CPAP.  2. Depression 3. Chronic systolic CHF: Nonischemic cardiomyopathy.  Diagnosed around 2/18.  - LHC (2/18): Normal coronaries.  - Echo (2/18): EF 20-25% - Echo (6/18): EF 40% - Echo (12/18): EF 35-40% - Echo (6/19): L V mildly dilated, EF 15-20%, RV severely dilated with severely decreased systolic function.  - CPX (7/19): peak VO2 16, VE/VCO2 slope 37, RER 1.03 => submaximal but probably moderate functional limitation.  - Cardiac MRI (7/19): EF 29% with mild LV dilation, mildly decreased RV systolic function EF 40%, mid-wall LGE throughtout the septal wall and the inferor wall.  - St Jude ICD placed in 8/19.  - Echo (12/19): EF 25-30%, diffuse hypokinesis, normal RV size and systolic function.  - Echo (12/20): EF 25-30%, moderate LVH, normal RV size and systolic function.  - TEE (12/20): EF 25-30%, mildly decreased RV systolic function.  - Echo (4/22): EF 25-30%, mild LV dilation, normal RV. 4. ETOH abuse.  5. Atrial fibrillation: First noted in 12/20.  - DCCV to NSR in 12/20.  6. 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 No drugs. Lives in [place].   FH: Sister with congenital heart abnormality.  No other cardiac disease.   ROS: All systems reviewed and negative except as per HPI.  Current Outpatient Medications  Medication  Sig Dispense Refill  . acetaminophen (TYLENOL) 650 MG CR tablet Take 650 mg by mouth every 8 (eight) hours as needed for pain.    . carvedilol (COREG) 25 MG tablet TAKE 1 TABLET (25 MG TOTAL) BY MOUTH 2 (TWO) TIMES DAILY WITH A MEAL. 180 tablet 1  . diclofenac Sodium (VOLTAREN) 1 % GEL Apply 4 g topically 4 (four) times daily. To affected joint. 100 g 11  . ELIQUIS 5 MG TABS tablet TAKE 1 TABLET BY MOUTH TWICE A DAY 180 tablet 3  . ENTRESTO 97-103 MG TAKE 1 TABLET BY MOUTH 2 (TWO) TIMES DAILY.  60 tablet 5  . FARXIGA 10 MG TABS tablet TAKE 1 TABLET BY MOUTH DAILY BEFORE BREAKFAST 30 tablet 11  . LORazepam (ATIVAN) 1 MG tablet Take 1 tablet (1 mg total) by mouth daily as needed for anxiety (Stopping ambien. no hydrocodone within 8 hours. no driving within 8 hours.). 30 tablet 1  . Multiple Vitamins-Minerals (MULTIVITAMIN WITH MINERALS) tablet Take 1 tablet by mouth daily.    . potassium chloride SA (KLOR-CON) 20 MEQ tablet Take 1 tablet (20 mEq total) by mouth daily. 90 tablet 3  . rosuvastatin (CRESTOR) 20 MG tablet TAKE 1 TABLET BY MOUTH EVERY DAY 30 tablet 11  . sildenafil (VIAGRA) 100 MG tablet TAKE 0.5 TABLETS (50 MG TOTAL) BY MOUTH AT BEDTIME AS NEEDED FOR ERECTILE DYSFUNCTION. 10 tablet 3  . spironolactone (ALDACTONE) 25 MG tablet TAKE 1 TABLET BY MOUTH EVERY DAY 30 tablet 11  . tiZANidine (ZANAFLEX) 4 MG tablet TAKE 1 TABLET BY MOUTH EVERY 6 HOURS AS NEEDED FOR MUSCLE SPASMS. 60 tablet 1  . torsemide (DEMADEX) 20 MG tablet Take 3 tablets (60 mg total) by mouth daily. 90 tablet 3   No current facility-administered medications for this encounter.   BP (!) 146/72   Pulse 91   Wt 125.6 kg (276 lb 12.8 oz)   SpO2 91%   BMI 39.72 kg/m   Wt Readings from Last 3 Encounters:  02/13/21 125.6 kg (276 lb 12.8 oz)  01/16/21 120.9 kg (266 lb 9.6 oz)  10/26/20 122.9 kg (271 lb)    General:  Well appearing. No resp difficulty HEENT: normal Neck: supple. no JVD. Carotids 2+ bilat; no bruits. No lymphadenopathy or thryomegaly appreciated. Cor: PMI nondisplaced. Regular rate & rhythm. No rubs, gallops or murmurs. Lungs: clear Abdomen: soft, nontender, distended. No hepatosplenomegaly. No bruits or masses. Good bowel sounds. Extremities: no cyanosis, clubbing, rash, R and LLE 1_ edema Neuro: alert & orientedx3, cranial nerves grossly intact. moves all 4 extremities w/o difficulty. Affect pleasant  Assessment/Plan: 1. Chronic systolic CHF: Nonischemic cardiomyopathy with biventricular  failure.  Cath in 2/18 with no coronary disease.  Echo (6/19) with EF 15-20%, severe RV dilation/dysfunction.  CPX 7/19 submaximal but probably moderate functional limitation.  Cardiac MRI in 7/19 showed EF 29%.  LGE pattern was concerning for prior viral myocarditis. Echo in 12/20 showed stable EF 25-30%.  Now with St Jude ICD.  Cause of CMP suspected to be viral myocarditis versus ETOH.  No family history of cardiomyopathy.  NYHA class II symptoms. He is volume overloaded by exam and Corvue again today. Suspect this is because he is drinking Gatorade  And extra liquids well over 64 ounces. He is still drinking at least a 6-pack of beer/day.   Discussed low salt food choices and limiting fluid intake.  -NYHA II. - Missed the last few days of torsemide. Stressed the importance of continuing torsemide 60  mg daily.  - Continue dapagliflozin 10 mg daily.  - Continue spironolactone 25 mg daily.  - Continue Coreg 25 mg bid. - Continue Entresto 97/103 bid.  - He needs to stop ETOH => suspect this is either the cause or a contributor to his cardiomyopathy.    - CPX in future, should have TKR first and recover from this.  2. OSA: Needs to use CPAP.  3. Anxiety: He is on sertraline.  4. ETOH Abuse: This is likely causing/contributing to his cardiomyopathy and likely played a role in triggering atrial fibrillation.  Still drinking at least a 6-pack/day.  -Discussed cessation.  5. Atrial fibrillation: Paroxysmal  - S/P DCCV in 12/20.  - Regular on exam.  - Continue Eliquis 5 mg bid.   - Discussed ETOH cessation.    - If atrial fibrillation returns off ETOH, consider ablation.  6. OA: Needs total hip replacement but will need to have back surgery first. Dr Shirlee Latch cleared for surgery.  7. Back Pain  Needs surgery next week.   Ok to come off elquis for 3 days before surgery and 2 days after. Discussed with Dr Shirlee Latch.    Follow up 6-8 weeks    John Schreier Np-C  02/13/2021

## 2021-02-13 ENCOUNTER — Encounter (HOSPITAL_COMMUNITY): Payer: Self-pay

## 2021-02-13 ENCOUNTER — Other Ambulatory Visit: Payer: Self-pay

## 2021-02-13 ENCOUNTER — Ambulatory Visit: Payer: Self-pay | Admitting: Orthopedic Surgery

## 2021-02-13 ENCOUNTER — Ambulatory Visit (HOSPITAL_COMMUNITY)
Admission: RE | Admit: 2021-02-13 | Discharge: 2021-02-13 | Disposition: A | Discharge status: Home / Self Care | Payer: 59 | Source: Ambulatory Visit | Attending: Adult Health | Admitting: Adult Health

## 2021-02-13 VITALS — BP 146/72 | HR 91 | Wt 276.8 lb

## 2021-02-13 DIAGNOSIS — Z95810 Presence of automatic (implantable) cardiac defibrillator: Secondary | ICD-10-CM | POA: Diagnosis not present

## 2021-02-13 DIAGNOSIS — F419 Anxiety disorder, unspecified: Secondary | ICD-10-CM | POA: Diagnosis not present

## 2021-02-13 DIAGNOSIS — I480 Paroxysmal atrial fibrillation: Secondary | ICD-10-CM

## 2021-02-13 DIAGNOSIS — Z7901 Long term (current) use of anticoagulants: Secondary | ICD-10-CM | POA: Diagnosis not present

## 2021-02-13 DIAGNOSIS — G4733 Obstructive sleep apnea (adult) (pediatric): Secondary | ICD-10-CM | POA: Diagnosis not present

## 2021-02-13 DIAGNOSIS — I5082 Biventricular heart failure: Secondary | ICD-10-CM | POA: Diagnosis not present

## 2021-02-13 DIAGNOSIS — I428 Other cardiomyopathies: Secondary | ICD-10-CM

## 2021-02-13 DIAGNOSIS — I5042 Chronic combined systolic (congestive) and diastolic (congestive) heart failure: Secondary | ICD-10-CM | POA: Diagnosis not present

## 2021-02-13 DIAGNOSIS — F1010 Alcohol abuse, uncomplicated: Secondary | ICD-10-CM | POA: Insufficient documentation

## 2021-02-13 DIAGNOSIS — F32A Depression, unspecified: Secondary | ICD-10-CM | POA: Diagnosis not present

## 2021-02-13 DIAGNOSIS — M549 Dorsalgia, unspecified: Secondary | ICD-10-CM | POA: Diagnosis not present

## 2021-02-13 DIAGNOSIS — Z79899 Other long term (current) drug therapy: Secondary | ICD-10-CM | POA: Insufficient documentation

## 2021-02-13 DIAGNOSIS — I5022 Chronic systolic (congestive) heart failure: Secondary | ICD-10-CM | POA: Diagnosis present

## 2021-02-13 DIAGNOSIS — Z7984 Long term (current) use of oral hypoglycemic drugs: Secondary | ICD-10-CM | POA: Diagnosis not present

## 2021-02-13 NOTE — Patient Instructions (Signed)
Be sure to take Torsemide 60 mg daily  Your physician recommends that you schedule a follow-up appointment in: 6 weeks with Dr Shirlee Latch or  in the Advanced Practitioners (PA/NP) Clinic    Do the following things EVERYDAY: 1) Weigh yourself in the morning before breakfast. Write it down and keep it in a log. 2) Take your medicines as prescribed 3) Eat low salt foods--Limit salt (sodium) to 2000 mg per day.  4) Stay as active as you can everyday 5) Limit all fluids for the day to less than 2 liters  At the [facility], you and your health needs are our priority. As part of our continuing mission to provide you with exceptional heart care, we have created designated Provider Care Teams. These Care Teams include your primary Cardiologist (physician) and Advanced Practice Providers (APPs- Physician Assistants and Nurse Practitioners) who all work together to provide you with the care you need, when you need it.   You may see any of the following providers on your designated Care Team at your next follow up: Marland Kitchen Dr Arvilla Meres . Dr Marca Ancona . Dr Thornell Mule . Tonye Becket, NP . Robbie Lis, PA . Shanda Bumps [place],NP . Karle Plumber, PharmD   Please be sure to bring in all your medications bottles to every appointment.   If you have any questions or concerns before your next appointment please send [place] a message through [place] or call our office at [phone].    TO LEAVE A MESSAGE FOR THE NURSE SELECT OPTION 2, PLEASE LEAVE A MESSAGE INCLUDING: . YOUR NAME . DATE OF BIRTH . CALL BACK NUMBER . REASON FOR CALL**this is important as we prioritize the call backs  YOU WILL RECEIVE A CALL BACK THE SAME DAY AS LONG AS YOU CALL BEFORE 4:00 PM

## 2021-02-13 NOTE — H&P (Deleted)
  The note originally documented on this encounter has been moved the the encounter in which it belongs.  

## 2021-02-13 NOTE — H&P (Signed)
Subjective:   Location: midline (low back, right hip) Quality: constant; worsening Severity: pain level 8/10 Duration: years; no injury- had a fall in March due to hip - scheduled for right THA Alleviating Factors: sitting; lying down; OTC medication (tylenol) Aggravating Factors: standing; walking; twisting Associated Symptoms: no numbness; no tingling; no change in bowel/bladder habits Previous Surgery: none (for the back); LEFT THA Prior Imaging: x ray; MRI ([facility]) Previous Injections: none Work Related: no H&P T12 Kypho 02/22/21. Fell on 5/25.  Patient Active Problem List   Diagnosis Date Noted  . GAD (generalized anxiety disorder) 05/31/2020  . Paroxysmal atrial fibrillation (HCC) 04/14/2020  . S/P ICD (internal cardiac defibrillator) procedure 04/14/2020  . Status post reverse total shoulder replacement, left 11/11/2019  . Hyperlipidemia 05/27/2018  . Unspecified fracture of upper end of left humerus, initial encounter for closed fracture 09/03/2017  . Essential hypertension 11/08/2016  . Non-ischemic cardiomyopathy (HCC)   . Chronic combined systolic and diastolic CHF, NYHA class 3 (HCC) 10/2016  . GI bleed 03/19/2016  . Alcohol dependence in remission (HCC) 03/19/2016  . S/P shoulder replacement 11/24/2015  . Allergic rhinitis 08/21/2015  . ED (erectile dysfunction) 08/21/2015  . Hyperglycemia 08/21/2015  . Insomnia 08/21/2015  . Sleep apnea 08/21/2015  . Degenerative joint disease of left hip 03/27/2015   Past Medical History:  Diagnosis Date  . Anxiety    Ativan  . Chronic combined systolic and diastolic CHF, NYHA class 3 (HCC) 10/2016   Nonischemic cardiomyopathy. EF 20-25%.  . Chronic left hip pain   . Depression    history of  . Dysrhythmia    A-FIB  . GI bleed 03/2016  . Headache   . Hypertensive heart disease with combined systolic and diastolic congestive heart failure (HCC) 10/2016  . Nonischemic cardiomyopathy (HCC) 10/2016   Echo with EF 20-25%.  Cardiac catheterization with no CAD. LVEDP was 41 mmHg, PCWP 36 mmHg  . Osteoarthritis    right shoulder  . Right hand fracture   . Seasonal allergies   . Sleep apnea    wears a CPAP  . Wears glasses     Past Surgical History:  Procedure Laterality Date  . CARDIAC CATHETERIZATION    . CARDIOVERSION N/A 09/01/2019   Procedure: CARDIOVERSION;  Surgeon: Laurey Morale, MD;  Location: [facility] ENDOSCOPY;  Service: Cardiovascular;  Laterality: N/A;  . ESOPHAGOGASTRODUODENOSCOPY (EGD) WITH PROPOFOL N/A 03/20/2016   Procedure: ESOPHAGOGASTRODUODENOSCOPY (EGD) WITH PROPOFOL;  Surgeon: Charlott Rakes, MD;  Location: [facility] ENDOSCOPY;  Service: Endoscopy;  Laterality: N/A;  . ICD IMPLANT N/A 05/06/2018   Procedure: ICD IMPLANT;  Surgeon: Thurmon Fair, MD;  Location: [facility] INVASIVE CV LAB;  Service: Cardiovascular;  Laterality: N/A;  . IRRIGATION AND DEBRIDEMENT SHOULDER Left 11/11/2019   Procedure: LEFT SHOULDER IRRIGATION AND DEBRIDEMENT WITH POLY EXCHANGE;  Surgeon: Jones Broom, MD;  Location: [facility] ORS;  Service: Orthopedics;  Laterality: Left;  . RIGHT/LEFT HEART CATH AND CORONARY ANGIOGRAPHY N/A 10/21/2016   Procedure: Right/Left Heart Cath and Coronary Angiography;  Surgeon: Runell Gess, MD;  Location: [facility] INVASIVE CV LAB;  Service: Cardiovascular: Angiographically normal coronary arteries. PCWP 33-36 mmHg, LVEDP 41 mmHg. PA pressure 60/35, mean 46 mmHg.  Cardiac output/cardiac index-3.93 /1.76 (Fick), 3.49/1.57 (thermodilution)  . TEE WITHOUT CARDIOVERSION N/A 09/01/2019   Procedure: TRANSESOPHAGEAL ECHOCARDIOGRAM (TEE);  Surgeon: Laurey Morale, MD;  Location: [facility] ENDOSCOPY;  Service: Cardiovascular;  Laterality: N/A;  . TOTAL HIP ARTHROPLASTY Left 03/27/2015   Procedure: LEFT TOTAL HIP ARTHROPLASTY ANTERIOR APPROACH;  Surgeon:  Samson Frederic, MD;  Location: [facility] OR;  Service: Orthopedics;  Laterality: Left;  . TOTAL SHOULDER ARTHROPLASTY Right 11/24/2015   Procedure: RIGHT TOTAL SHOULDER  ARTHROPLASTY;  Surgeon: Beverely Low, MD;  Location: [facility] OR;  Service: Orthopedics;  Laterality: Right;  . TOTAL SHOULDER ARTHROPLASTY Left 10/19/2019   Procedure: REVERSE TOTAL SHOULDER ARTHROPLASTY;  Surgeon: Jones Broom, MD;  Location: [facility] ORS;  Service: Orthopedics;  Laterality: Left;  . TRANSTHORACIC ECHOCARDIOGRAM  10/2016   EF 20-25%. Diffuse hypokinesis but akinesis of the entire inferoseptal wall and apical wall. Moderate biatrial enlargement. PA pressure estimated 64 mmHg.  . WISDOM TOOTH EXTRACTION      Current Outpatient Medications  Medication Sig Dispense Refill Last Dose  . acetaminophen (TYLENOL) 650 MG CR tablet Take 650 mg by mouth every 8 (eight) hours as needed for pain.     . carvedilol (COREG) 25 MG tablet TAKE 1 TABLET (25 MG TOTAL) BY MOUTH 2 (TWO) TIMES DAILY WITH A MEAL. 180 tablet 1   . diclofenac Sodium (VOLTAREN) 1 % GEL Apply 4 g topically 4 (four) times daily. To affected joint. 100 g 11   . ELIQUIS 5 MG TABS tablet TAKE 1 TABLET BY MOUTH TWICE A DAY 180 tablet 3   . ENTRESTO 97-103 MG TAKE 1 TABLET BY MOUTH 2 (TWO) TIMES DAILY. 60 tablet 5   . FARXIGA 10 MG TABS tablet TAKE 1 TABLET BY MOUTH DAILY BEFORE BREAKFAST 30 tablet 11   . LORazepam (ATIVAN) 1 MG tablet Take 1 tablet (1 mg total) by mouth daily as needed for anxiety (Stopping ambien. no hydrocodone within 8 hours. no driving within 8 hours.). 30 tablet 1   . Multiple Vitamins-Minerals (MULTIVITAMIN WITH MINERALS) tablet Take 1 tablet by mouth daily.     . potassium chloride SA (KLOR-CON) 20 MEQ tablet Take 1 tablet (20 mEq total) by mouth daily. 90 tablet 3   . rosuvastatin (CRESTOR) 20 MG tablet TAKE 1 TABLET BY MOUTH EVERY DAY 30 tablet 11   . sildenafil (VIAGRA) 100 MG tablet TAKE 0.5 TABLETS (50 MG TOTAL) BY MOUTH AT BEDTIME AS NEEDED FOR ERECTILE DYSFUNCTION. 10 tablet 3   . spironolactone (ALDACTONE) 25 MG tablet TAKE 1 TABLET BY MOUTH EVERY DAY 30 tablet 11   . tiZANidine (ZANAFLEX) 4 MG tablet  TAKE 1 TABLET BY MOUTH EVERY 6 HOURS AS NEEDED FOR MUSCLE SPASMS. 60 tablet 1   . torsemide (DEMADEX) 20 MG tablet Take 3 tablets (60 mg total) by mouth daily. 90 tablet 3    No current facility-administered medications for this visit.   No Known Allergies  Social History   Tobacco Use  . Smoking status: Never Smoker  . Smokeless tobacco: Never Used  Substance Use Topics  . Alcohol use: Yes    Alcohol/week: 12.0 standard drinks    Types: 12 Cans of beer per week    Comment: occasional    Family History  Problem Relation Age of Onset  . Dementia Mother        died at 2  . Lung cancer Father        smoker  . Congenital heart disease Sister        lived to 8   . Healthy Brother   . Healthy Sister   . Prostate cancer Brother        possible cancer  . Healthy Brother   . Healthy Brother     Review of Systems Pertinent items are noted in HPI.  Objective:  Vitals: Ht: 5 ft 10.5 in 02/13/2021 01:50 pm BP: 142/78 02/13/2021 01:57 pm Pulse: 107 bpm 02/13/2021 01:54 pm O2Sat: 93% 02/13/2021 01:54 pm  Clinical exam: Slade is a pleasant individual, who appears younger than their stated age. He is alert and orientated 3. No shortness of breath, chest pain.  Heart: RRR, no obvious rubs or murmers  Lungs: CTAB  Abdomen is soft and non-tender, negative loss of bowel and bladder control, no rebound tenderness. BSx4  Negative: skin lesions abrasions contusions Peripheral pulses: 2+ dorsalis pedis/posterior tibialis pulses bilaterally. Compartment soft and nontender.  Gait pattern: Normal Assistive devices: None  Neuro: 5/5 motor strength in the lower extremity bilaterally. Negative Babinski test, no clonus, normal sensation light touch. 1+ deep tendon reflexes in the lower extremity. Negative nerve root tension signs.  Musculoskeletal: Significant upper lumbar/lower thoracic pain with direct palpation and attempts at range of motion. Pain is decreased when he lays  flat. Lumbar x-rays demonstrate a T12 compression fracture with approximately 30% loss of anterior height. Chronic degenerative disc disease with loss of normal disc space height L5-S1. No scoliosis.  Lumbar MRI: completed on 01/18/21 was reviewed with the patient. It was completed at [facility] imaging; I have independently reviewed the images as well as the radiology report. Acute T12 compression fracture with approximately 30% loss of height. Positive edema within the bone. Patient had a prior MRI in 2022 and this is a new finding. Stable degenerative changes at L5-S1. No high-grade spinal canal or foraminal stenosis.  Assessment:   Lucile is a very pleasant 60 year old gentleman who had a sharp increase in his pain over the last several weeks. Imaging studies confirm an acute T12 compression fracture and chronic degenerative disc disease at L5-S1. Because of the severe nature of his pain he would like to move forward with surgical intervention. At this point I think the best option is a kyphoplasty for the atraumatic fracture of T12. Based on the MRI there is no evidence that there is a pathological lesion. Therefore it is my impression that he has a atraumatic insufficiency fracture.  Plan:   I have gone over the surgical procedure in great detail including showing him the animated video. All of his questions were addressed, and he expressed an understanding of the risks and benefits of surgery.Risks of surgery include: Infection, bleeding, death, stroke, paralysis, nerve damage, leak of cement, need for additional surgery including open decompression. Ongoing or worse pain. Goals of surgery: Reduction in pain, and improvement in quality of life  We have obtained preoperative medical clearance from the patient's primary care provider as well as cardiologist. He is on Eliquis. I have asked him to speak with his cardiologist as to recommendations for holding this in the preoperative and  postoperative.Marland Kitchen He is not on any aspirin. Not using any anti-inflammatories. He is on a multivitamin which I have advised him to hold 1 week prior to surgery.  We have also discussed the post-operative recovery period to include: bathing/showering restrictions, wound healing, activity (and driving) restrictions, medications/pain mangement.  We have also discussed post-operative redflags to include: signs and symptoms of postoperative infection, DVT/PE.  Patient was given a copy of his discharge instructions which were reviewed at today's office visit. Patient is here today with his live-in girlfriend. All their questions were invited and answered.  Follow-up: 2 weeks postop

## 2021-02-14 NOTE — Pre-Procedure Instructions (Signed)
Surgical Instructions    Your procedure is scheduled on Thursday June 9th.  Report to [facility] Main Entrance "A" at 05:30 A.M., then check in with the Admitting office.  Call this number if you have problems the morning of surgery:  [phone]   If you have any questions prior to your surgery date call [phone]: Open Monday-Friday 8am-4pm    Remember:  Do not eat after midnight the night before your surgery  You may drink clear liquids until 04:30 A.M.the morning of your surgery.   Clear liquids allowed are: Water, Non-Citrus Juices (without pulp), Carbonated Beverages, Clear Tea, Black Coffee Only, and Gatorade    Take these medicines the morning of surgery with A SIP OF WATER   acetaminophen (TYLENOL)- If needed  carvedilol (COREG)  diclofenac Sodium (VOLTAREN)   LORazepam (ATIVAN)- If needed  rosuvastatin (CRESTOR)  tiZANidine (ZANAFLEX)- If needed   As of today, STOP taking any Aspirin (unless otherwise instructed by your surgeon) Aleve, Naproxen, Ibuprofen, Motrin, Advil, Goody's, BC's, all herbal medications, fish oil, and all vitamins.  Please follow your surgeon's instructions on when to stop taking Eliquis.  Do Not take Marcelline Deist the day before or the morning of surgery.           Do NOT Smoke (Tobacco/Vaping) or drink Alcohol 24 hours prior to your procedure.  If you use a CPAP at night, you may bring all equipment for your overnight stay.   Contacts, glasses, piercing's, hearing aid's, dentures or partials may not be worn into surgery, please bring cases for these belongings.    For patients admitted to the hospital, discharge time will be determined by your treatment team.   Patients discharged the day of surgery will not be allowed to drive home, and someone needs to stay with them for 24 hours.    Special instructions:   [place]- Preparing For Surgery  Before surgery, you can play an important role. Because skin is not sterile, your skin needs  to be as free of germs as possible. You can reduce the number of germs on your skin by washing with CHG (chlorahexidine gluconate) Soap before surgery.  CHG is an antiseptic cleaner which kills germs and bonds with the skin to continue killing germs even after washing.    Oral Hygiene is also important to reduce your risk of infection.  Remember - BRUSH YOUR TEETH THE MORNING OF SURGERY WITH YOUR REGULAR TOOTHPASTE  Please do not use if you have an allergy to CHG or antibacterial soaps. If your skin becomes reddened/irritated stop using the CHG.  Do not shave (including legs and underarms) for at least 48 hours prior to first CHG shower. It is OK to shave your face.  Please follow these instructions carefully.   1. Shower the NIGHT BEFORE SURGERY and the MORNING OF SURGERY  2. If you chose to wash your hair, wash your hair first as usual with your normal shampoo.  3. After you shampoo, rinse your hair and body thoroughly to remove the shampoo.  4. Use CHG Soap as you would any other liquid soap. You can apply CHG directly to the skin and wash gently with a scrungie or a clean washcloth.   5. Apply the CHG Soap to your body ONLY FROM THE NECK DOWN.  Do not use on open wounds or open sores. Avoid contact with your eyes, ears, mouth and genitals (private parts). Wash Face and genitals (private parts)  with your normal soap.   6.  Wash thoroughly, paying special attention to the area where your surgery will be performed.  7. Thoroughly rinse your body with warm water from the neck down.  8. DO NOT shower/wash with your normal soap after using and rinsing off the CHG Soap.  9. Pat yourself dry with a CLEAN TOWEL.  10. Wear CLEAN PAJAMAS to bed the night before surgery  11. Place CLEAN SHEETS on your bed the night before your surgery  12. DO NOT SLEEP WITH PETS.   Day of Surgery: Shower with CHG soap. Do not wear jewelry, make up, or nail polish Do not wear lotions, powders,  perfumes/colognes, or deodorant. Do not shave 48 hours prior to surgery.  Men may shave face and neck. Do not bring valuables to the hospital. [facility] is not responsible for any belongings or valuables. Wear Clean/Comfortable clothing the morning of surgery Remember to brush your teeth WITH YOUR REGULAR TOOTHPASTE.   Please read over the following fact sheets that you were given.

## 2021-02-15 ENCOUNTER — Encounter (HOSPITAL_COMMUNITY)
Admission: RE | Admit: 2021-02-15 | Discharge: 2021-02-15 | Disposition: A | Discharge status: Home / Self Care | Payer: 59 | Source: Ambulatory Visit | Attending: Orthopedic Surgery | Admitting: Orthopedic Surgery

## 2021-02-15 ENCOUNTER — Other Ambulatory Visit: Payer: Self-pay

## 2021-02-15 ENCOUNTER — Encounter (HOSPITAL_COMMUNITY): Payer: Self-pay

## 2021-02-15 ENCOUNTER — Encounter: Payer: Self-pay | Admitting: Cardiovascular Disease

## 2021-02-15 DIAGNOSIS — Z01818 Encounter for other preprocedural examination: Secondary | ICD-10-CM

## 2021-02-15 DIAGNOSIS — Z79899 Other long term (current) drug therapy: Secondary | ICD-10-CM | POA: Insufficient documentation

## 2021-02-15 DIAGNOSIS — F1010 Alcohol abuse, uncomplicated: Secondary | ICD-10-CM | POA: Insufficient documentation

## 2021-02-15 DIAGNOSIS — E119 Type 2 diabetes mellitus without complications: Secondary | ICD-10-CM | POA: Insufficient documentation

## 2021-02-15 DIAGNOSIS — I428 Other cardiomyopathies: Secondary | ICD-10-CM | POA: Insufficient documentation

## 2021-02-15 DIAGNOSIS — G4733 Obstructive sleep apnea (adult) (pediatric): Secondary | ICD-10-CM | POA: Insufficient documentation

## 2021-02-15 DIAGNOSIS — S22089A Unspecified fracture of T11-T12 vertebra, initial encounter for closed fracture: Secondary | ICD-10-CM | POA: Insufficient documentation

## 2021-02-15 DIAGNOSIS — I5042 Chronic combined systolic (congestive) and diastolic (congestive) heart failure: Secondary | ICD-10-CM | POA: Insufficient documentation

## 2021-02-15 DIAGNOSIS — W19XXXA Unspecified fall, initial encounter: Secondary | ICD-10-CM | POA: Insufficient documentation

## 2021-02-15 DIAGNOSIS — I4891 Unspecified atrial fibrillation: Secondary | ICD-10-CM | POA: Insufficient documentation

## 2021-02-15 DIAGNOSIS — Z95810 Presence of automatic (implantable) cardiac defibrillator: Secondary | ICD-10-CM | POA: Insufficient documentation

## 2021-02-15 DIAGNOSIS — I110 Hypertensive heart disease with heart failure: Secondary | ICD-10-CM | POA: Insufficient documentation

## 2021-02-15 HISTORY — DX: Presence of automatic (implantable) cardiac defibrillator: Z95.810

## 2021-02-15 LAB — BASIC METABOLIC PANEL
Anion gap: 13 (ref 5–15)
BUN: 7 mg/dL (ref 6–20)
CO2: 26 mmol/L (ref 22–32)
Calcium: 8.5 mg/dL — ABNORMAL LOW (ref 8.9–10.3)
Chloride: 93 mmol/L — ABNORMAL LOW (ref 98–111)
Creatinine, Ser: 1.01 mg/dL (ref 0.61–1.24)
GFR, Estimated: >60 mL/min (ref >60)
Glucose, Bld: 118 mg/dL — ABNORMAL HIGH (ref 70–99)
Potassium: 3.2 mmol/L — ABNORMAL LOW (ref 3.5–5.1)
Sodium: 132 mmol/L — ABNORMAL LOW (ref 135–145)

## 2021-02-15 LAB — URINALYSIS, ROUTINE W REFLEX MICROSCOPIC
Bilirubin Urine: NEGATIVE
Glucose, UA: 150 mg/dL — AB
Hgb urine dipstick: NEGATIVE
Ketones, ur: NEGATIVE mg/dL
Leukocytes,Ua: NEGATIVE
Nitrite: NEGATIVE
Protein, ur: NEGATIVE mg/dL
Specific Gravity, Urine: 1.004 — ABNORMAL LOW (ref 1.005–1.030)
pH: 6 (ref 5.0–8.0)

## 2021-02-15 LAB — CBC
HCT: 25.5 % — ABNORMAL LOW (ref 39.0–52.0)
Hemoglobin: 8 g/dL — ABNORMAL LOW (ref 13.0–17.0)
MCH: 32.7 pg (ref 26.0–34.0)
MCHC: 31.4 g/dL (ref 30.0–36.0)
MCV: 104.1 fL — ABNORMAL HIGH (ref 80.0–100.0)
Platelets: 514 10*3/uL — ABNORMAL HIGH (ref 150–400)
RBC: 2.45 MIL/uL — ABNORMAL LOW (ref 4.22–5.81)
RDW: 14.2 % (ref 11.5–15.5)
WBC: 12.4 10*3/uL — ABNORMAL HIGH (ref 4.0–10.5)
nRBC: 0.5 % — ABNORMAL HIGH (ref 0.0–0.2)

## 2021-02-15 LAB — SURGICAL PCR SCREEN
MRSA, PCR: NEGATIVE
Staphylococcus aureus: NEGATIVE

## 2021-02-15 NOTE — Progress Notes (Signed)
Emailed device clinic requesting orders for upcoming procedure.

## 2021-02-15 NOTE — Pre-Procedure Instructions (Addendum)
Surgical Instructions    Your procedure is scheduled on Thursday June 9th.  Report to [facility] Main Entrance "A" at 05:30 A.M., then check in with the Admitting office.  Call this number if you have problems the morning of surgery:  [phone]    Remember:  Do not eat after midnight the night before your surgery  You may drink clear liquids until 04:30 A.M.the morning of your surgery.   Clear liquids allowed are: Water, Non-Citrus Juices (without pulp), Carbonated Beverages, Clear Tea, Black Coffee Only, and Gatorade.    Take these medicines the morning of surgery with A SIP OF WATER   acetaminophen (TYLENOL)- If needed  carvedilol (COREG)  LORazepam (ATIVAN)- If needed  rosuvastatin (CRESTOR)  tiZANidine (ZANAFLEX)- If needed  >>Per your doctor's instructions, STOP ELIQUIS 3 days prior to surgery.<<  As of today, STOP taking any Aspirin (unless otherwise instructed by your surgeon) Aleve, Naproxen, Ibuprofen, Motrin, Advil, Goody's, BC's, all herbal medications, fish oil, and all vitamins.   Do NOT Smoke (Tobacco/Vaping) or drink Alcohol 24 hours prior to your procedure.  If you use a CPAP at night, you may bring all equipment for your overnight stay.   Contacts, glasses, piercing's, hearing aid's, dentures or partials may not be worn into surgery, please bring cases for these belongings.    For patients admitted to the hospital, discharge time will be determined by your treatment team.   Patients discharged the day of surgery will not be allowed to drive home, and someone needs to stay with them for 24 hours.    Special instructions:   [place]- Preparing For Surgery  Before surgery, you can play an important role. Because skin is not sterile, your skin needs to be as free of germs as possible. You can reduce the number of germs on your skin by washing with CHG (chlorahexidine gluconate) Soap before surgery.  CHG is an antiseptic cleaner which kills germs and bonds  with the skin to continue killing germs even after washing.    Oral Hygiene is also important to reduce your risk of infection.  Remember - BRUSH YOUR TEETH THE MORNING OF SURGERY WITH YOUR REGULAR TOOTHPASTE  Please do not use if you have an allergy to CHG or antibacterial soaps. If your skin becomes reddened/irritated stop using the CHG.  Do not shave (including legs and underarms) for at least 48 hours prior to first CHG shower. It is OK to shave your face.  Please follow these instructions carefully.   1. Shower the NIGHT BEFORE SURGERY and the MORNING OF SURGERY  2. If you chose to wash your hair, wash your hair first as usual with your normal shampoo.  3. After you shampoo, rinse your hair and body thoroughly to remove the shampoo.  4. Use CHG Soap as you would any other liquid soap. You can apply CHG directly to the skin and wash gently with a scrungie or a clean washcloth.   5. Apply the CHG Soap to your body ONLY FROM THE NECK DOWN.  Do not use on open wounds or open sores. Avoid contact with your eyes, ears, mouth and genitals (private parts). Wash Face and genitals (private parts)  with your normal soap.   6. Wash thoroughly, paying special attention to the area where your surgery will be performed.  7. Thoroughly rinse your body with warm water from the neck down.  8. DO NOT shower/wash with your normal soap after using and rinsing off the CHG Soap.  9. Pat yourself dry with a CLEAN TOWEL.  10. Wear CLEAN PAJAMAS to bed the night before surgery  11. Place CLEAN SHEETS on your bed the night before your surgery  12. DO NOT SLEEP WITH PETS.   Day of Surgery: Shower with CHG soap. Do not wear jewelry. Do not wear lotions, powders, perfumes/colognes, or deodorant. Do not shave 48 hours prior to surgery.  Men may shave face and neck. Do not bring valuables to the hospital. [facility] is not responsible for any belongings or valuables. Wear Clean/Comfortable clothing  the morning of surgery Remember to brush your teeth WITH YOUR REGULAR TOOTHPASTE.   Please read over the following fact sheets that you were given.

## 2021-02-15 NOTE — Progress Notes (Signed)
PERIOPERATIVE PRESCRIPTION FOR IMPLANTED CARDIAC DEVICE PROGRAMMING   Patient Information: Name: John [name], John [name]  DOB: [birth year]  MRN: [id number]     Planned Procedure: Thoracic 12 kyphoplasty  Surgeon: Dr. Venita Lick  Date of Procedure: 02/22/21    Please send documentation back to:  [facility] (Fax # [phone])   Inda Coke, RN  02/15/2021 2:03 PM   Device Information:   Clinic EP Physician:   Thurmon Fair Device Type:  Biochemist, clinical and Phone #:  St[employer] Jude/[employer]bbott: [phone] Pacemaker Dependent?:  No Date of Last Device Check:  Overdue for follow-up        Normal Device Function?: Overdue for follow-up since January, 2021   Electrophysiologist's Recommendations:    Patient overdue for follow-up.  Industry must check device prior to procedure.  Per Device Clinic Standing Orders, Uvaldo Rising  02/15/2021 3:24 PM

## 2021-02-15 NOTE — Progress Notes (Addendum)
PCP - Aldine Contes. Durene Cal, MD Cardiologist - Marca Ancona, MD Electrophysiologist- Thurmon Fair, MD  PPM/ICD - Satira Sark Jude ICD Device Orders - Emailed Device Clinic Rep Notified - Yes, Spoke w/ Vernona Rieger  Chest x-ray - 02/15/21 EKG - 01/16/21 Stress Test - 04/01/18 ECHO - 01/16/21 Cardiac Cath - 10/2016  Sleep Study - Yes, positive for OSA CPAP - Sometimes  Patient denies being diabetic.  Blood Thinner Instructions: Per MD's instructions, stop Eliquis 3 days before sx. Aspirin Instructions: N/A  ERAS Protcol - Yes PRE-SURGERY Ensure or G2- Not ordered  COVID TEST- N/A; Ambulatory sx.   Anesthesia review: Yes, per MD order; cardiac hx.  Patient denies shortness of breath, fever, cough and chest pain at PAT appointment   All instructions explained to the patient, with a verbal understanding of the material. Patient agrees to go over the instructions while at home for a better understanding. Patient also instructed to self quarantine after being tested for COVID-19. The opportunity to ask questions was provided.

## 2021-02-16 ENCOUNTER — Emergency Department (HOSPITAL_BASED_OUTPATIENT_CLINIC_OR_DEPARTMENT_OTHER): Payer: 59

## 2021-02-16 ENCOUNTER — Other Ambulatory Visit: Payer: Self-pay

## 2021-02-16 ENCOUNTER — Telehealth: Payer: Self-pay

## 2021-02-16 ENCOUNTER — Encounter (HOSPITAL_BASED_OUTPATIENT_CLINIC_OR_DEPARTMENT_OTHER): Payer: Self-pay | Admitting: *Deleted

## 2021-02-16 ENCOUNTER — Inpatient Hospital Stay (HOSPITAL_BASED_OUTPATIENT_CLINIC_OR_DEPARTMENT_OTHER)
Admission: EM | Admit: 2021-02-16 | Discharge: 2021-02-20 | DRG: 812 | Disposition: A | Discharge status: Home / Self Care | Payer: 59 | Attending: Internal Medicine | Admitting: Internal Medicine

## 2021-02-16 DIAGNOSIS — Z801 Family history of malignant neoplasm of trachea, bronchus and lung: Secondary | ICD-10-CM

## 2021-02-16 DIAGNOSIS — Z8249 Family history of ischemic heart disease and other diseases of the circulatory system: Secondary | ICD-10-CM

## 2021-02-16 DIAGNOSIS — F1010 Alcohol abuse, uncomplicated: Secondary | ICD-10-CM | POA: Diagnosis not present

## 2021-02-16 DIAGNOSIS — F32A Depression, unspecified: Secondary | ICD-10-CM | POA: Diagnosis present

## 2021-02-16 DIAGNOSIS — Z96612 Presence of left artificial shoulder joint: Secondary | ICD-10-CM | POA: Diagnosis present

## 2021-02-16 DIAGNOSIS — F419 Anxiety disorder, unspecified: Secondary | ICD-10-CM | POA: Diagnosis present

## 2021-02-16 DIAGNOSIS — Z95810 Presence of automatic (implantable) cardiac defibrillator: Secondary | ICD-10-CM | POA: Diagnosis not present

## 2021-02-16 DIAGNOSIS — G4730 Sleep apnea, unspecified: Secondary | ICD-10-CM | POA: Diagnosis not present

## 2021-02-16 DIAGNOSIS — G8929 Other chronic pain: Secondary | ICD-10-CM | POA: Diagnosis present

## 2021-02-16 DIAGNOSIS — Z79899 Other long term (current) drug therapy: Secondary | ICD-10-CM

## 2021-02-16 DIAGNOSIS — Z20822 Contact with and (suspected) exposure to covid-19: Secondary | ICD-10-CM | POA: Diagnosis present

## 2021-02-16 DIAGNOSIS — Z7901 Long term (current) use of anticoagulants: Secondary | ICD-10-CM

## 2021-02-16 DIAGNOSIS — D539 Nutritional anemia, unspecified: Principal | ICD-10-CM | POA: Diagnosis present

## 2021-02-16 DIAGNOSIS — D649 Anemia, unspecified: Secondary | ICD-10-CM

## 2021-02-16 DIAGNOSIS — I5042 Chronic combined systolic (congestive) and diastolic (congestive) heart failure: Secondary | ICD-10-CM | POA: Diagnosis present

## 2021-02-16 DIAGNOSIS — I9589 Other hypotension: Secondary | ICD-10-CM

## 2021-02-16 DIAGNOSIS — I110 Hypertensive heart disease with heart failure: Secondary | ICD-10-CM | POA: Diagnosis not present

## 2021-02-16 DIAGNOSIS — R296 Repeated falls: Secondary | ICD-10-CM | POA: Insufficient documentation

## 2021-02-16 DIAGNOSIS — E861 Hypovolemia: Secondary | ICD-10-CM | POA: Diagnosis not present

## 2021-02-16 DIAGNOSIS — E669 Obesity, unspecified: Secondary | ICD-10-CM | POA: Diagnosis present

## 2021-02-16 DIAGNOSIS — E872 Acidosis, unspecified: Secondary | ICD-10-CM

## 2021-02-16 DIAGNOSIS — Z96642 Presence of left artificial hip joint: Secondary | ICD-10-CM | POA: Diagnosis present

## 2021-02-16 DIAGNOSIS — S2241XA Multiple fractures of ribs, right side, initial encounter for closed fracture: Secondary | ICD-10-CM | POA: Diagnosis present

## 2021-02-16 DIAGNOSIS — I480 Paroxysmal atrial fibrillation: Secondary | ICD-10-CM | POA: Diagnosis not present

## 2021-02-16 DIAGNOSIS — S20219A Contusion of unspecified front wall of thorax, initial encounter: Secondary | ICD-10-CM

## 2021-02-16 DIAGNOSIS — Z96611 Presence of right artificial shoulder joint: Secondary | ICD-10-CM | POA: Diagnosis not present

## 2021-02-16 DIAGNOSIS — M25551 Pain in right hip: Secondary | ICD-10-CM | POA: Diagnosis present

## 2021-02-16 DIAGNOSIS — M19011 Primary osteoarthritis, right shoulder: Secondary | ICD-10-CM | POA: Diagnosis present

## 2021-02-16 DIAGNOSIS — D509 Iron deficiency anemia, unspecified: Secondary | ICD-10-CM | POA: Diagnosis present

## 2021-02-16 DIAGNOSIS — E871 Hypo-osmolality and hyponatremia: Secondary | ICD-10-CM | POA: Diagnosis not present

## 2021-02-16 DIAGNOSIS — J302 Other seasonal allergic rhinitis: Secondary | ICD-10-CM | POA: Diagnosis present

## 2021-02-16 DIAGNOSIS — F10920 Alcohol use, unspecified with intoxication, uncomplicated: Secondary | ICD-10-CM

## 2021-02-16 DIAGNOSIS — Z6838 Body mass index (BMI) 38.0-38.9, adult: Secondary | ICD-10-CM

## 2021-02-16 DIAGNOSIS — Y906 Blood alcohol level of 120-199 mg/100 ml: Secondary | ICD-10-CM | POA: Diagnosis not present

## 2021-02-16 DIAGNOSIS — I959 Hypotension, unspecified: Secondary | ICD-10-CM | POA: Diagnosis not present

## 2021-02-16 LAB — CBC WITH DIFFERENTIAL/PLATELET
Abs Immature Granulocytes: 0.12 10*3/uL — ABNORMAL HIGH (ref 0.00–0.07)
Basophils Absolute: 0.1 10*3/uL (ref 0.0–0.1)
Basophils Relative: 1 %
Eosinophils Absolute: 0.4 10*3/uL (ref 0.0–0.5)
Eosinophils Relative: 4 %
HCT: 25 % — ABNORMAL LOW (ref 39.0–52.0)
Hemoglobin: 8.1 g/dL — ABNORMAL LOW (ref 13.0–17.0)
Immature Granulocytes: 1 %
Lymphocytes Relative: 18 %
Lymphs Abs: 1.9 10*3/uL (ref 0.7–4.0)
MCH: 32.9 pg (ref 26.0–34.0)
MCHC: 32.4 g/dL (ref 30.0–36.0)
MCV: 101.6 fL — ABNORMAL HIGH (ref 80.0–100.0)
Monocytes Absolute: 0.8 10*3/uL (ref 0.1–1.0)
Monocytes Relative: 8 %
Neutro Abs: 7 10*3/uL (ref 1.7–7.7)
Neutrophils Relative %: 68 %
Platelets: 479 10*3/uL — ABNORMAL HIGH (ref 150–400)
RBC: 2.46 MIL/uL — ABNORMAL LOW (ref 4.22–5.81)
RDW: 13.9 % (ref 11.5–15.5)
WBC: 10.3 10*3/uL (ref 4.0–10.5)
nRBC: 0.3 % — ABNORMAL HIGH (ref 0.0–0.2)

## 2021-02-16 LAB — COMPREHENSIVE METABOLIC PANEL
ALT: 14 U/L (ref 0–44)
AST: 16 U/L (ref 15–41)
Albumin: 3.7 g/dL (ref 3.5–5.0)
Alkaline Phosphatase: 57 U/L (ref 38–126)
Anion gap: 14 (ref 5–15)
BUN: 11 mg/dL (ref 6–20)
CO2: 25 mmol/L (ref 22–32)
Calcium: 8.3 mg/dL — ABNORMAL LOW (ref 8.9–10.3)
Chloride: 87 mmol/L — ABNORMAL LOW (ref 98–111)
Creatinine, Ser: 1.04 mg/dL (ref 0.61–1.24)
GFR, Estimated: >60 mL/min (ref >60)
Glucose, Bld: 124 mg/dL — ABNORMAL HIGH (ref 70–99)
Potassium: 3.2 mmol/L — ABNORMAL LOW (ref 3.5–5.1)
Sodium: 126 mmol/L — ABNORMAL LOW (ref 135–145)
Total Bilirubin: 0.3 mg/dL (ref 0.3–1.2)
Total Protein: 6.2 g/dL — ABNORMAL LOW (ref 6.5–8.1)

## 2021-02-16 LAB — URINALYSIS, ROUTINE W REFLEX MICROSCOPIC
Bilirubin Urine: NEGATIVE
Glucose, UA: 100 mg/dL — AB
Hgb urine dipstick: NEGATIVE
Ketones, ur: NEGATIVE mg/dL
Leukocytes,Ua: NEGATIVE
Nitrite: NEGATIVE
Protein, ur: NEGATIVE mg/dL
Specific Gravity, Urine: <1.005 — ABNORMAL LOW (ref 1.005–1.030)
pH: 5 (ref 5.0–8.0)

## 2021-02-16 LAB — PROTIME-INR
INR: 1.1 (ref 0.8–1.2)
Prothrombin Time: 14.1 seconds (ref 11.4–15.2)

## 2021-02-16 LAB — TROPONIN I (HIGH SENSITIVITY)
Troponin I (High Sensitivity): 13 ng/L (ref <18)
Troponin I (High Sensitivity): 12 ng/L (ref <18)

## 2021-02-16 LAB — LACTIC ACID, PLASMA
Lactic Acid, Venous: 2.4 mmol/L (ref 0.5–1.9)
Lactic Acid, Venous: 2 mmol/L (ref 0.5–1.9)

## 2021-02-16 LAB — BRAIN NATRIURETIC PEPTIDE: B Natriuretic Peptide: 437.3 pg/mL — ABNORMAL HIGH (ref 0.0–100.0)

## 2021-02-16 LAB — OCCULT BLOOD X 1 CARD TO LAB, STOOL: Fecal Occult Bld: NEGATIVE

## 2021-02-16 LAB — LIPASE, BLOOD: Lipase: 29 U/L (ref 11–51)

## 2021-02-16 LAB — ETHANOL: Alcohol, Ethyl (B): 160 mg/dL — ABNORMAL HIGH (ref <10)

## 2021-02-16 MED ORDER — SODIUM CHLORIDE 0.9 % IV BOLUS
250.0000 mL | Freq: Once | INTRAVENOUS | Status: AC
  Administered 2021-02-16: 250 mL via INTRAVENOUS

## 2021-02-16 MED ORDER — ADULT MULTIVITAMIN W/MINERALS CH
1.0000 | ORAL_TABLET | Freq: Every day | ORAL | Status: DC
  Administered 2021-02-17 – 2021-02-20 (×4): 1 via ORAL
  Filled 2021-02-16 (×4): qty 1

## 2021-02-16 MED ORDER — LORAZEPAM 1 MG PO TABS
1.0000 mg | ORAL_TABLET | Freq: Once | ORAL | Status: AC
  Administered 2021-02-17: 1 mg via ORAL
  Filled 2021-02-16: qty 1

## 2021-02-16 MED ORDER — LORAZEPAM 1 MG PO TABS
1.0000 mg | ORAL_TABLET | ORAL | Status: AC | PRN
  Administered 2021-02-17: 1 mg via ORAL
  Administered 2021-02-17: 2 mg via ORAL
  Administered 2021-02-18 (×3): 1 mg via ORAL
  Administered 2021-02-19 (×2): 2 mg via ORAL
  Administered 2021-02-19: 1 mg via ORAL
  Filled 2021-02-16: qty 2
  Filled 2021-02-16 (×4): qty 1
  Filled 2021-02-16 (×2): qty 2
  Filled 2021-02-16: qty 1

## 2021-02-16 MED ORDER — LORAZEPAM 2 MG/ML IJ SOLN
1.0000 mg | INTRAMUSCULAR | Status: AC | PRN
Start: 2021-02-16 — End: 2021-02-19

## 2021-02-16 MED ORDER — SODIUM CHLORIDE 0.9 % IV BOLUS
500.0000 mL | Freq: Once | INTRAVENOUS | Status: AC
  Administered 2021-02-16: 500 mL via INTRAVENOUS

## 2021-02-16 MED ORDER — FOLIC ACID 1 MG PO TABS
1.0000 mg | ORAL_TABLET | Freq: Every day | ORAL | Status: DC
  Administered 2021-02-17 – 2021-02-20 (×4): 1 mg via ORAL
  Filled 2021-02-16 (×4): qty 1

## 2021-02-16 MED ORDER — OXYCODONE-ACETAMINOPHEN 5-325 MG PO TABS
1.0000 | ORAL_TABLET | Freq: Once | ORAL | Status: AC
  Administered 2021-02-16: 1 via ORAL
  Filled 2021-02-16: qty 1

## 2021-02-16 MED ORDER — THIAMINE HCL 100 MG/ML IJ SOLN
100.0000 mg | Freq: Every day | INTRAMUSCULAR | Status: DC
  Filled 2021-02-16 (×2): qty 2

## 2021-02-16 MED ORDER — THIAMINE HCL 100 MG PO TABS
100.0000 mg | ORAL_TABLET | Freq: Every day | ORAL | Status: DC
  Administered 2021-02-17 – 2021-02-20 (×4): 100 mg via ORAL
  Filled 2021-02-16 (×4): qty 1

## 2021-02-16 NOTE — ED Notes (Signed)
Patient transported to CT and returned with RN present

## 2021-02-16 NOTE — ED Notes (Signed)
ED Provider at bedside. 

## 2021-02-16 NOTE — ED Triage Notes (Addendum)
Pt reports he fell last week- Has old bruising around eyes. C/o right rib pain r/t fall. He had pre-op blood work done yesterday and was advised to come to ED bc Hgb was 8.0. Also reports he has been SOB "for months"

## 2021-02-16 NOTE — ED Notes (Signed)
[facility] has arrived to p/u patient.

## 2021-02-16 NOTE — Telephone Encounter (Signed)
I spoke with John [name] from [facility] to address concerns. She demanded that labs be read today for surgery on 02/21/21. She was told that once Dr. Durene Cal is able to review lab work, someone from our office will give her a call. She was given our offices fax number to send labs, she said were done today 02/16/2021.

## 2021-02-16 NOTE — Telephone Encounter (Signed)
Marchelle Folks from [facility] called in and stated they had some blood work on the patient and was a little concerned on some of it was off and wanted to discuss with the CMA. Patient is scheduled for surgery next week so she would like to discuss before his surgery. She would like a call back at [phone].

## 2021-02-16 NOTE — H&P (Signed)
History and Physical    John [name] YTK:[id number] DOB: December 10, 1960 DOA: 02/16/2021  PCP: Shelva Majestic, MD  Patient coming from: [facility]  I have personally briefly reviewed patient's old medical records in [facility]  Chief Complaint: Hypotension and anemia  HPI: John [name] is a 60 y.o. male with medical history significant for combined systolic and diastolic heart failure (EF 20-25%) s/p ICD, avascular necrosis of the left hip s/p replacement, anxiety, depression and alcohol abuse who presents as a transfer from outside ED for concerns of hypotension and acute anemia.  Patient had preop yesterday for scheduled kyphoplasty and had lab work concerning for acute anemia with hemoglobin of 8 from a prior of 14 several months ago.  He denies any dark stools or any source of bleeding.  He reports having a fall about 3 days ago when he was getting out of his car.  States he has been having chronic right hip pain and felt weak and fell face first.  Has bruising beneath both eyes.  Also noted dizziness before the fall.  He is on Eliquis but CT head was negative for any acute findings. Has had occasional shortness of breath worse with walking flights of stairs and exertion for the past 6 to 8 months.  Thinks it is because he has put on more weight and has chronic back and hip pain.  He reports that he has been drinking more this past month due to severe back and hip pain since his PCP has not been providing him with much pain control.  Has been drinking up to 6 beers per night although ED documentation notes 8-12.  Last drink was prior to ED admission.  Has only been eating snacks throughout the day outside of alcohol use.  ED Course: He was hypotensive initially in the systolic of 80s over 40s and was given low volume due to concerns of low EF.  Hemoglobin of 8..  Lactate of 2. sodium of 126.  K of 3.2.  Alcohol level of 160  Review of Systems:  Constitutional: No Weight Change,  No Fever ENT/Mouth: No sore throat, No Rhinorrhea Eyes: No Eye Pain, No Vision Changes Cardiovascular: No Chest Pain, + SOB, No PND, + Dyspnea on Exertion, No Orthopnea,  No Edema, No Palpitations Respiratory: No Cough, No Sputum, No Wheezing, no Dyspnea  Gastrointestinal: No Nausea, No Vomiting, No Diarrhea, No Constipation, No Pain Genitourinary: no Urinary Incontinence, No Urgency, No Flank Pain Musculoskeletal: No Arthralgias, No Myalgias Skin: No Skin Lesions, No Pruritus, Neuro: no Weakness, No Numbness,  No Loss of Consciousness, No Syncope Psych: No Anxiety/Panic, No Depression, no decrease appetite Heme/Lymph: No Bruising, No Bleeding  Past Medical History:  Diagnosis Date  . AICD (automatic cardioverter/defibrillator) present    [employer]. Jude  . Anxiety    Ativan  . Chronic combined systolic and diastolic CHF, NYHA class 3 (HCC) 10/2016   Nonischemic cardiomyopathy. EF 20-25%.  . Chronic left hip pain   . Depression    history of  . Dysrhythmia    A-FIB  . GI bleed 03/2016  . Headache   . Hypertensive heart disease with combined systolic and diastolic congestive heart failure (HCC) 10/2016  . Nonischemic cardiomyopathy (HCC) 10/2016   Echo with EF 20-25%. Cardiac catheterization with no CAD. LVEDP was 41 mmHg, PCWP 36 mmHg  . Osteoarthritis    right shoulder  . Right hand fracture   . Seasonal allergies   . Sleep apnea    wears  a CPAP  . Wears glasses     Past Surgical History:  Procedure Laterality Date  . CARDIAC CATHETERIZATION    . CARDIOVERSION N/A 09/01/2019   Procedure: CARDIOVERSION;  Surgeon: Laurey Morale, MD;  Location: [facility] ENDOSCOPY;  Service: Cardiovascular;  Laterality: N/A;  . ESOPHAGOGASTRODUODENOSCOPY (EGD) WITH PROPOFOL N/A 03/20/2016   Procedure: ESOPHAGOGASTRODUODENOSCOPY (EGD) WITH PROPOFOL;  Surgeon: Charlott Rakes, MD;  Location: [facility] ENDOSCOPY;  Service: Endoscopy;  Laterality: N/A;  . ICD IMPLANT N/A 05/06/2018   Procedure: ICD IMPLANT;   Surgeon: Thurmon Fair, MD;  Location: [facility] INVASIVE CV LAB;  Service: Cardiovascular;  Laterality: N/A;  . IRRIGATION AND DEBRIDEMENT SHOULDER Left 11/11/2019   Procedure: LEFT SHOULDER IRRIGATION AND DEBRIDEMENT WITH POLY EXCHANGE;  Surgeon: Jones Broom, MD;  Location: [facility] ORS;  Service: Orthopedics;  Laterality: Left;  . RIGHT/LEFT HEART CATH AND CORONARY ANGIOGRAPHY N/A 10/21/2016   Procedure: Right/Left Heart Cath and Coronary Angiography;  Surgeon: Runell Gess, MD;  Location: [facility] INVASIVE CV LAB;  Service: Cardiovascular: Angiographically normal coronary arteries. PCWP 33-36 mmHg, LVEDP 41 mmHg. PA pressure 60/35, mean 46 mmHg.  Cardiac output/cardiac index-3.93 /1.76 (Fick), 3.49/1.57 (thermodilution)  . TEE WITHOUT CARDIOVERSION N/A 09/01/2019   Procedure: TRANSESOPHAGEAL ECHOCARDIOGRAM (TEE);  Surgeon: Laurey Morale, MD;  Location: [facility] ENDOSCOPY;  Service: Cardiovascular;  Laterality: N/A;  . TOTAL HIP ARTHROPLASTY Left 03/27/2015   Procedure: LEFT TOTAL HIP ARTHROPLASTY ANTERIOR APPROACH;  Surgeon: Samson Frederic, MD;  Location: [facility] OR;  Service: Orthopedics;  Laterality: Left;  . TOTAL SHOULDER ARTHROPLASTY Right 11/24/2015   Procedure: RIGHT TOTAL SHOULDER ARTHROPLASTY;  Surgeon: Beverely Low, MD;  Location: [facility] OR;  Service: Orthopedics;  Laterality: Right;  . TOTAL SHOULDER ARTHROPLASTY Left 10/19/2019   Procedure: REVERSE TOTAL SHOULDER ARTHROPLASTY;  Surgeon: Jones Broom, MD;  Location: [facility] ORS;  Service: Orthopedics;  Laterality: Left;  . TRANSTHORACIC ECHOCARDIOGRAM  10/2016   EF 20-25%. Diffuse hypokinesis but akinesis of the entire inferoseptal wall and apical wall. Moderate biatrial enlargement. PA pressure estimated 64 mmHg.  . WISDOM TOOTH EXTRACTION       reports that he has never smoked. He has never used smokeless tobacco. He reports current alcohol use of about 12.0 standard drinks of alcohol per week. He reports that he does not use drugs. Social History  No  Known Allergies  Family History  Problem Relation Age of Onset  . Dementia Mother        died at 26  . Lung cancer Father        smoker  . Congenital heart disease Sister        lived to 25   . Healthy Brother   . Healthy Sister   . Prostate cancer Brother        possible cancer  . Healthy Brother   . Healthy Brother      Prior to Admission medications   Medication Sig Start Date End Date Taking? Authorizing Provider  acetaminophen (TYLENOL) 650 MG CR tablet Take 650 mg by mouth every 8 (eight) hours as needed for pain.    [provider]  carvedilol (COREG) 25 MG tablet TAKE 1 TABLET (25 MG TOTAL) BY MOUTH 2 (TWO) TIMES DAILY WITH A MEAL. 11/10/20   Laurey Morale, MD  diclofenac Sodium (VOLTAREN) 1 % GEL Apply 4 g topically 4 (four) times daily. To affected joint. 07/24/20   Rodolph Bong, MD  ELIQUIS 5 MG TABS tablet TAKE 1 TABLET BY MOUTH TWICE A DAY 04/18/20  Laurey MoraleMcLean, Dalton S, MD  ENTRESTO 97-103 MG TAKE 1 TABLET BY MOUTH 2 (TWO) TIMES DAILY. 07/24/20   Laurey MoraleMcLean, Dalton S, MD  FARXIGA 10 MG TABS tablet TAKE 1 TABLET BY MOUTH DAILY BEFORE BREAKFAST 06/19/20   Laurey MoraleMcLean, Dalton S, MD  LORazepam (ATIVAN) 1 MG tablet Take 1 tablet (1 mg total) by mouth daily as needed for anxiety (Stopping ambien. no hydrocodone within 8 hours. no driving within 8 hours.). 12/14/20   Shelva MajesticHunter, Stephen O, MD  Multiple Vitamins-Minerals (MULTIVITAMIN WITH MINERALS) tablet Take 1 tablet by mouth daily.    [provider]  potassium chloride SA (KLOR-CON) 20 MEQ tablet Take 1 tablet (20 mEq total) by mouth daily. 01/17/21   Laurey MoraleMcLean, Dalton S, MD  rosuvastatin (CRESTOR) 20 MG tablet TAKE 1 TABLET BY MOUTH EVERY DAY 10/16/20   Laurey MoraleMcLean, Dalton S, MD  sildenafil (VIAGRA) 100 MG tablet TAKE 0.5 TABLETS (50 MG TOTAL) BY MOUTH AT BEDTIME AS NEEDED FOR ERECTILE DYSFUNCTION. 04/20/20   Laurey MoraleMcLean, Dalton S, MD  spironolactone (ALDACTONE) 25 MG tablet TAKE 1 TABLET BY MOUTH EVERY DAY 07/24/20   Laurey MoraleMcLean, Dalton S, MD   tiZANidine (ZANAFLEX) 4 MG tablet TAKE 1 TABLET BY MOUTH EVERY 6 HOURS AS NEEDED FOR MUSCLE SPASMS. 01/25/21   Rodolph Bongorey, Evan S, MD  torsemide (DEMADEX) 20 MG tablet Take 3 tablets (60 mg total) by mouth daily. 01/16/21 04/16/21  Laurey MoraleMcLean, Dalton S, MD    Physical Exam: Vitals:   02/16/21 1915 02/16/21 1953 02/16/21 2100 02/16/21 2258  BP: (!) 91/47 93/76 (!) 92/56 (!) 97/58  Pulse: 63 65 63 63  Resp: 16 20 15 18   Temp:    98.3 F (36.8 C)  TempSrc:      SpO2: 98% 96% 96% 98%  Weight:      Height:        Constitutional: NAD, calm, comfortable, obese middle-age male laying flat in bed Vitals:   02/16/21 1915 02/16/21 1953 02/16/21 2100 02/16/21 2258  BP: (!) 91/47 93/76 (!) 92/56 (!) 97/58  Pulse: 63 65 63 63  Resp: 16 20 15 18   Temp:    98.3 F (36.8 C)  TempSrc:      SpO2: 98% 96% 96% 98%  Weight:      Height:       Eyes: PERRL, lids and conjunctivae normal, bruising beneath bilateral eyes on maxillary ENMT: Mucous membranes are moist.  Neck: normal, supple Respiratory: clear to auscultation bilaterally, no wheezing, no crackles. Normal respiratory effort. No accessory muscle use.  Cardiovascular: Regular rate and rhythm, no murmurs / rubs / gallops. No extremity edema.  Abdomen: no tenderness, no masses palpated.  Bowel sounds positive.  Musculoskeletal: no clubbing / cyanosis. No joint deformity upper and lower extremities. Good ROM, no contractures. Normal muscle tone.  Skin: no rashes, lesions, ulcers. No induration Neurologic: CN 2-12 grossly intact. Sensation intact,  Strength 5/5 in all 4.  Psychiatric: Normal judgment and insight. Alert and oriented x 3. Normal mood.     Labs on Admission: I have personally reviewed following labs and imaging studies  CBC: Recent Labs  Lab 02/15/21 1400 02/16/21 1648  WBC 12.4* 10.3  NEUTROABS  --  7.0  HGB 8.0* 8.1*  HCT 25.5* 25.0*  MCV 104.1* 101.6*  PLT 514* 479*   Basic Metabolic Panel: Recent Labs  Lab 02/15/21 1400  02/16/21 1648  NA 132* 126*  K 3.2* 3.2*  CL 93* 87*  CO2 26 25  GLUCOSE 118* 124*  BUN 7  11  CREATININE 1.01 1.04  CALCIUM 8.5* 8.3*   GFR: Estimated Creatinine Clearance: 102.5 mL/min (by C-G formula based on SCr of 1.04 mg/dL). Liver Function Tests: Recent Labs  Lab 02/16/21 1648  AST 16  ALT 14  ALKPHOS 57  BILITOT 0.3  PROT 6.2*  ALBUMIN 3.7   Recent Labs  Lab 02/16/21 1648  LIPASE 29   No results for input(s): AMMONIA in the last 168 hours. Coagulation Profile: Recent Labs  Lab 02/16/21 1648  INR 1.1   Cardiac Enzymes: No results for input(s): CKTOTAL, CKMB, CKMBINDEX, TROPONINI in the last 168 hours. BNP (last 3 results) No results for input(s): PROBNP in the last 8760 hours. HbA1C: No results for input(s): HGBA1C in the last 72 hours. CBG: No results for input(s): GLUCAP in the last 168 hours. Lipid Profile: No results for input(s): CHOL, HDL, LDLCALC, TRIG, CHOLHDL, LDLDIRECT in the last 72 hours. Thyroid Function Tests: No results for input(s): TSH, T4TOTAL, FREET4, T3FREE, THYROIDAB in the last 72 hours. Anemia Panel: No results for input(s): VITAMINB12, FOLATE, FERRITIN, TIBC, IRON, RETICCTPCT in the last 72 hours. Urine analysis:    Component Value Date/Time   COLORURINE COLORLESS (A) 02/16/2021 1906   APPEARANCEUR CLEAR 02/16/2021 1906   LABSPEC <1.005 (L) 02/16/2021 1906   PHURINE 5.0 02/16/2021 1906   GLUCOSEU 100 (A) 02/16/2021 1906   HGBUR NEGATIVE 02/16/2021 1906   BILIRUBINUR NEGATIVE 02/16/2021 1906   KETONESUR NEGATIVE 02/16/2021 1906   PROTEINUR NEGATIVE 02/16/2021 1906   UROBILINOGEN 0.2 03/21/2015 0903   NITRITE NEGATIVE 02/16/2021 1906   LEUKOCYTESUR NEGATIVE 02/16/2021 1906    Radiological Exams on Admission: DG Chest 2 View  Result Date: 02/15/2021 CLINICAL DATA:  Preop back surgery EXAM: CHEST - 2 VIEW COMPARISON:  08/23/2020 FINDINGS: Bilateral shoulder replacements. Left-sided single lead pacing device as before.  Linear scarring or atelectasis at the bases. No acute airspace disease, pleural effusion, or pneumothorax. Normal cardiomediastinal silhouette. Degenerative changes of the spine IMPRESSION: No active cardiopulmonary disease. Minimal scarring or atelectasis at the bases Electronically Signed   By: Jasmine Pang M.D.   On: 02/15/2021 21:41   CT Head Wo Contrast  Result Date: 02/16/2021 CLINICAL DATA:  Fall last week.  Bruising around eyes. EXAM: CT HEAD WITHOUT CONTRAST TECHNIQUE: Contiguous axial images were obtained from the base of the skull through the vertex without intravenous contrast. COMPARISON:  None. FINDINGS: Brain: No acute intracranial abnormality. Specifically, no hemorrhage, hydrocephalus, mass lesion, acute infarction, or significant intracranial injury. Vascular: No hyperdense vessel or unexpected calcification. Skull: No acute calvarial abnormality. Sinuses/Orbits: Orbital soft tissues unremarkable. No visible orbital fracture. Paranasal sinuses clear. Other: None IMPRESSION: No acute intracranial abnormality. Electronically Signed   By: Charlett Nose M.D.   On: 02/16/2021 17:40   CT CHEST ABDOMEN PELVIS WO CONTRAST  Result Date: 02/16/2021 CLINICAL DATA:  Fall last week, right rib pain.  Low hemoglobin. EXAM: CT CHEST, ABDOMEN AND PELVIS WITHOUT CONTRAST TECHNIQUE: Multidetector CT imaging of the chest, abdomen and pelvis was performed following the standard protocol without IV contrast. COMPARISON:  None. FINDINGS: CT CHEST FINDINGS Cardiovascular: Left side pacer in place with lead in the right ventricle. Heart is normal size. Coronary artery and aortic calcifications. No aneurysm. Mediastinum/Nodes: No mediastinal, hilar, or axillary adenopathy. Trachea and esophagus are unremarkable. Thyroid unremarkable. Lungs/Pleura: Minimal right base and dependent atelectasis. No confluent opacities or effusions. Musculoskeletal: Multiple old bilateral rib fractures. Acute right rib fractures in the  anterolateral 6 through 9th right ribs. No  pneumothorax. CT ABDOMEN PELVIS FINDINGS Hepatobiliary: No hepatic injury or perihepatic hematoma. Gallbladder is unremarkable Pancreas: No focal abnormality or ductal dilatation. Spleen: No splenic injury or perisplenic hematoma. Adrenals/Urinary Tract: No adrenal hemorrhage or renal injury identified. Bladder is unremarkable. Stomach/Bowel: Stomach, large and small bowel grossly unremarkable. Vascular/Lymphatic: Aortic atherosclerosis. No evidence of aneurysm or adenopathy. Reproductive: No visible focal abnormality. Other: No free fluid or free air. Musculoskeletal: Prior left hip replacement. No acute bony abnormality. IMPRESSION: Acute fractures through the right anterolateral 6th through 9th ribs. Multiple old bilateral rib fractures. Minimal right base and dependent atelectasis. No pneumothorax. Coronary artery disease, aortic atherosclerosis. No acute findings in the abdomen or pelvis. Electronically Signed   By: Charlett Nose M.D.   On: 02/16/2021 17:45      Assessment/Plan  Acute anemia -Hgb of 8 this afternoon in ED from prior of 14 about 5 months ago. Check stat repeat.  Transfusion threshold of Hgb <7. -FOBT negative  -check iron panel, vitamin B12 and folic   Hypotension -secondary to hypovolemia - caution with fluid due to low EF of 20  Hyponatremia -likely beer potmania from ETOH use - Na of 126 on admit. check stat repeat since he received fluid in ED  ETOH abuse -Worse 6 beers nightly Per ED documentation he endorsed 8-12 beers.  Last drink prior to ED admission earlier today. -CIWA protocol  Combined systolic and diastolic CHF s/p ICD -EF 25 to 30% with grade 1 diastolic dysfunction on 01/2019 - Hypovolemic from acute anemia.  Monitor fluid status closely. -Hold Entresto, Coreg, Spironolactone and Torsemide with hypotension  Paroxysmal atrial fibrillation -Hold Eliquis for workup for acute anemia  Anxiety/depression -  continue SSRI    DVT prophylaxis:.SCDs Code Status: Full Family Communication: Plan discussed with patient at bedside  disposition Plan: Home with observation Consults called:  Admission status: Observation  Level of care: Telemetry  Status is: Observation  The patient remains OBS appropriate and will d/c before 2 midnights.  Dispo: The patient is from: Home              Anticipated d/c is to: Home              Patient currently is not medically stable to d/c.   Difficult to place patient No         Anselm Jungling DO [facility]   If 7PM-7AM, please contact night-coverage [email or web address]   02/16/2021, 11:39 PM

## 2021-02-16 NOTE — ED Provider Notes (Addendum)
MED[facility]ENTER [facility] EMERGENCY DEPT Provider Note   CSN: [id number] Arrival date & time: 02/16/21  1630     History Chief Complaint  Patient presents with  . Hypotension    Abnormal Labs    John [name] is a 60 y.o. male.  John [name] is scheduled to have kyphoplasty.  He had preop labs done yesterday, and was called today and directed to the emergency department for a hemoglobin of 8.  Over a week ago, he fell while getting out of the car.  His significant other states that he has been drinking heavily, and he does endorse significant alcohol use.  He drinks between 8 and 12, 12 ounce beers daily.  His alcohol consumption has led to some unsteadiness.  He did not seek medical evaluation, but he has been dealing with some right-sided chest and abdominal pain ever since.  He also sustained some bruising to his right eye.  He is on Eliquis.  He also has had somewhat chronic shortness of breath.  The history is provided by the patient and a significant other.  Fall This is a new problem. The current episode started more than 1 week ago. Episode frequency: one episode. The problem has not changed since onset.Associated symptoms include chest pain, abdominal pain and shortness of breath. Pertinent negatives include no headaches. The symptoms are aggravated by bending (moving). Nothing relieves the symptoms. He has tried acetaminophen (alcohol) for the symptoms. The treatment provided mild relief.       Past Medical History:  Diagnosis Date  . AICD (automatic cardioverter/defibrillator) present    [employer]  . Anxiety    Ativan  . Chronic combined systolic and diastolic CHF, NYHA class 3 (HCC) 10/2016   Nonischemic cardiomyopathy. EF 20-25%.  . Chronic left hip pain   . Depression    history of  . Dysrhythmia    A-FIB  . GI bleed 03/2016  . Headache   . Hypertensive heart disease with combined systolic and diastolic congestive heart failure (HCC) 10/2016  . Nonischemic  cardiomyopathy (HCC) 10/2016   Echo with EF 20-25%. Cardiac catheterization with no CAD. LVEDP was 41 mmHg, PCWP 36 mmHg  . Osteoarthritis    right shoulder  . Right hand fracture   . Seasonal allergies   . Sleep apnea    wears a CPAP  . Wears glasses     Patient Active Problem List   Diagnosis Date Noted  . GAD (generalized anxiety disorder) 05/31/2020  . Paroxysmal atrial fibrillation (HCC) 04/14/2020  . S/P ICD (internal cardiac defibrillator) procedure 04/14/2020  . Status post reverse total shoulder replacement, left 11/11/2019  . Hyperlipidemia 05/27/2018  . Unspecified fracture of upper end of left humerus, initial encounter for closed fracture 09/03/2017  . Essential hypertension 11/08/2016  . Non-ischemic cardiomyopathy (HCC)   . Chronic combined systolic and diastolic CHF, NYHA class 3 (HCC) 10/2016  . GI bleed 03/19/2016  . Alcohol dependence in remission (HCC) 03/19/2016  . S/P shoulder replacement 11/24/2015  . Allergic rhinitis 08/21/2015  . ED (erectile dysfunction) 08/21/2015  . Hyperglycemia 08/21/2015  . Insomnia 08/21/2015  . Sleep apnea 08/21/2015  . Degenerative joint disease of left hip 03/27/2015    Past Surgical History:  Procedure Laterality Date  . CARDIAC CATHETERIZATION    . CARDIOVERSION N/A 09/01/2019   Procedure: CARDIOVERSION;  Surgeon: Laurey Morale, MD;  Location: [facility] ENDOSCOPY;  Service: Cardiovascular;  Laterality: N/A;  . ESOPHAGOGASTRODUODENOSCOPY (EGD) WITH PROPOFOL N/A 03/20/2016   Procedure: ESOPHAGOGASTRODUODENOSCOPY (EGD)  WITH PROPOFOL;  Surgeon: Charlott Rakes, MD;  Location: [facility] ENDOSCOPY;  Service: Endoscopy;  Laterality: N/A;  . ICD IMPLANT N/A 05/06/2018   Procedure: ICD IMPLANT;  Surgeon: Thurmon Fair, MD;  Location: [facility] INVASIVE CV LAB;  Service: Cardiovascular;  Laterality: N/A;  . IRRIGATION AND DEBRIDEMENT SHOULDER Left 11/11/2019   Procedure: LEFT SHOULDER IRRIGATION AND DEBRIDEMENT WITH POLY EXCHANGE;  Surgeon:  Jones Broom, MD;  Location: [facility] ORS;  Service: Orthopedics;  Laterality: Left;  . RIGHT/LEFT HEART CATH AND CORONARY ANGIOGRAPHY N/A 10/21/2016   Procedure: Right/Left Heart Cath and Coronary Angiography;  Surgeon: Runell Gess, MD;  Location: [facility] INVASIVE CV LAB;  Service: Cardiovascular: Angiographically normal coronary arteries. PCWP 33-36 mmHg, LVEDP 41 mmHg. PA pressure 60/35, mean 46 mmHg.  Cardiac output/cardiac index-3.93 /1.76 (Fick), 3.49/1.57 (thermodilution)  . TEE WITHOUT CARDIOVERSION N/A 09/01/2019   Procedure: TRANSESOPHAGEAL ECHOCARDIOGRAM (TEE);  Surgeon: Laurey Morale, MD;  Location: [facility] ENDOSCOPY;  Service: Cardiovascular;  Laterality: N/A;  . TOTAL HIP ARTHROPLASTY Left 03/27/2015   Procedure: LEFT TOTAL HIP ARTHROPLASTY ANTERIOR APPROACH;  Surgeon: Samson Frederic, MD;  Location: [facility] OR;  Service: Orthopedics;  Laterality: Left;  . TOTAL SHOULDER ARTHROPLASTY Right 11/24/2015   Procedure: RIGHT TOTAL SHOULDER ARTHROPLASTY;  Surgeon: Beverely Low, MD;  Location: [facility] OR;  Service: Orthopedics;  Laterality: Right;  . TOTAL SHOULDER ARTHROPLASTY Left 10/19/2019   Procedure: REVERSE TOTAL SHOULDER ARTHROPLASTY;  Surgeon: Jones Broom, MD;  Location: [facility] ORS;  Service: Orthopedics;  Laterality: Left;  . TRANSTHORACIC ECHOCARDIOGRAM  10/2016   EF 20-25%. Diffuse hypokinesis but akinesis of the entire inferoseptal wall and apical wall. Moderate biatrial enlargement. PA pressure estimated 64 mmHg.  . WISDOM TOOTH EXTRACTION         Family History  Problem Relation Age of Onset  . Dementia Mother        died at 10  . Lung cancer Father        smoker  . Congenital heart disease Sister        lived to 56   . Healthy Brother   . Healthy Sister   . Prostate cancer Brother        possible cancer  . Healthy Brother   . Healthy Brother     Social History   Tobacco Use  . Smoking status: Never Smoker  . Smokeless tobacco: Never Used  Vaping Use  . Vaping Use: Never used   Substance Use Topics  . Alcohol use: Yes    Alcohol/week: 12.0 standard drinks    Types: 12 Cans of beer per week    Comment: per week  . Drug use: No    Home Medications Prior to Admission medications   Medication Sig Start Date End Date Taking? Authorizing Provider  acetaminophen (TYLENOL) 650 MG CR tablet Take 650 mg by mouth every 8 (eight) hours as needed for pain.    [provider]  carvedilol (COREG) 25 MG tablet TAKE 1 TABLET (25 MG TOTAL) BY MOUTH 2 (TWO) TIMES DAILY WITH A MEAL. 11/10/20   Laurey Morale, MD  diclofenac Sodium (VOLTAREN) 1 % GEL Apply 4 g topically 4 (four) times daily. To affected joint. 07/24/20   Rodolph Bong, MD  ELIQUIS 5 MG TABS tablet TAKE 1 TABLET BY MOUTH TWICE A DAY 04/18/20   Laurey Morale, MD  ENTRESTO 97-103 MG TAKE 1 TABLET BY MOUTH 2 (TWO) TIMES DAILY. 07/24/20   Laurey Morale, MD  FARXIGA 10 MG TABS tablet TAKE  1 TABLET BY MOUTH DAILY BEFORE BREAKFAST 06/19/20   Laurey Morale, MD  LORazepam (ATIVAN) 1 MG tablet Take 1 tablet (1 mg total) by mouth daily as needed for anxiety (Stopping ambien. no hydrocodone within 8 hours. no driving within 8 hours.). 12/14/20   Shelva Majestic, MD  Multiple Vitamins-Minerals (MULTIVITAMIN WITH MINERALS) tablet Take 1 tablet by mouth daily.    [provider]  potassium chloride SA (KLOR-CON) 20 MEQ tablet Take 1 tablet (20 mEq total) by mouth daily. 01/17/21   Laurey Morale, MD  rosuvastatin (CRESTOR) 20 MG tablet TAKE 1 TABLET BY MOUTH EVERY DAY 10/16/20   Laurey Morale, MD  sildenafil (VIAGRA) 100 MG tablet TAKE 0.5 TABLETS (50 MG TOTAL) BY MOUTH AT BEDTIME AS NEEDED FOR ERECTILE DYSFUNCTION. 04/20/20   Laurey Morale, MD  spironolactone (ALDACTONE) 25 MG tablet TAKE 1 TABLET BY MOUTH EVERY DAY 07/24/20   Laurey Morale, MD  tiZANidine (ZANAFLEX) 4 MG tablet TAKE 1 TABLET BY MOUTH EVERY 6 HOURS AS NEEDED FOR MUSCLE SPASMS. 01/25/21   Rodolph Bong, MD  torsemide (DEMADEX) 20 MG tablet  Take 3 tablets (60 mg total) by mouth daily. 01/16/21 04/16/21  Laurey Morale, MD    Allergies    Patient has no known allergies.  Review of Systems   Review of Systems  Constitutional: Negative for chills and fever.  HENT: Negative for ear pain and sore throat.   Eyes: Negative for pain and visual disturbance.  Respiratory: Positive for shortness of breath. Negative for cough.   Cardiovascular: Positive for chest pain. Negative for palpitations.  Gastrointestinal: Positive for abdominal pain. Negative for vomiting.  Genitourinary: Negative for dysuria and hematuria.  Musculoskeletal: Negative for arthralgias and back pain.  Skin: Negative for color change and rash.  Neurological: Negative for seizures, syncope and headaches.  All other systems reviewed and are negative.   Physical Exam Updated Vital Signs BP (!) 92/54   Pulse 63   Temp 98.5 F (36.9 C) (Oral)   Resp 18   Ht 5\' 11"  (1.803 m)   Wt 124 kg   SpO2 100%   BMI 38.13 kg/m   Physical Exam Vitals and nursing note reviewed.  Constitutional:      Appearance: He is well-developed. He is ill-appearing.     Comments: Pale, chronically ill appearing  HENT:     Head: Normocephalic and atraumatic.     Nose: Nose normal.     Mouth/Throat:     Mouth: Mucous membranes are moist.     Comments: No dental trauma Eyes:     Extraocular Movements: Extraocular movements intact.     Conjunctiva/sclera: Conjunctivae normal.     Pupils: Pupils are equal, round, and reactive to light.     Comments: Bruising over the right orbit  Cardiovascular:     Rate and Rhythm: Normal rate and regular rhythm.     Heart sounds: No murmur heard.   Pulmonary:     Effort: Pulmonary effort is normal. No respiratory distress.     Breath sounds: Normal breath sounds.  Abdominal:     Palpations: Abdomen is soft.     Tenderness: There is abdominal tenderness.     Comments: Resolving bruising at the right lower chest and right upper abdomen  with mild overlying tenderness  Musculoskeletal:        General: No deformity.     Cervical back: Neck supple. No tenderness.     Right lower leg:  Edema present.     Left lower leg: Edema present.     Comments: 1 + pitting edema to the ankles  Skin:    General: Skin is warm and dry.  Neurological:     General: No focal deficit present.     Mental Status: He is alert and oriented to person, place, and time.     Sensory: No sensory deficit.     Motor: No weakness.  Psychiatric:        Mood and Affect: Mood normal.        Behavior: Behavior normal.     ED Results / Procedures / Treatments   Labs (all labs ordered are listed, but only abnormal results are displayed) Labs Reviewed  COMPREHENSIVE METABOLIC PANEL - Abnormal; Notable for the following components:      Result Value   Sodium 126 (*)    Potassium 3.2 (*)    Chloride 87 (*)    Glucose, Bld 124 (*)    Calcium 8.3 (*)    Total Protein 6.2 (*)    All other components within normal limits  ETHANOL - Abnormal; Notable for the following components:   Alcohol, Ethyl (B) 160 (*)    All other components within normal limits  BRAIN NATRIURETIC PEPTIDE - Abnormal; Notable for the following components:   B Natriuretic Peptide 437.3 (*)    All other components within normal limits  LACTIC ACID, PLASMA - Abnormal; Notable for the following components:   Lactic Acid, Venous 2.4 (*)    All other components within normal limits  LACTIC ACID, PLASMA - Abnormal; Notable for the following components:   Lactic Acid, Venous 2.0 (*)    All other components within normal limits  CBC WITH DIFFERENTIAL/PLATELET - Abnormal; Notable for the following components:   RBC 2.46 (*)    Hemoglobin 8.1 (*)    HCT 25.0 (*)    MCV 101.6 (*)    Platelets 479 (*)    nRBC 0.3 (*)    Abs Immature Granulocytes 0.12 (*)    All other components within normal limits  URINALYSIS, ROUTINE W REFLEX MICROSCOPIC - Abnormal; Notable for the following  components:   Color, Urine COLORLESS (*)    Specific Gravity, Urine <1.005 (*)    Glucose, UA 100 (*)    All other components within normal limits  CULTURE, BLOOD (ROUTINE X 2)  CULTURE, BLOOD (ROUTINE X 2)  SARS CORONAVIRUS 2 (TAT 6-24 HRS)  OCCULT BLOOD X 1 CARD TO LAB, STOOL  LIPASE, BLOOD  PROTIME-INR  TROPONIN I (HIGH SENSITIVITY)  TROPONIN I (HIGH SENSITIVITY)    EKG None  Radiology DG Chest 2 View  Result Date: 02/15/2021 CLINICAL DATA:  Preop back surgery EXAM: CHEST - 2 VIEW COMPARISON:  08/23/2020 FINDINGS: Bilateral shoulder replacements. Left-sided single lead pacing device as before. Linear scarring or atelectasis at the bases. No acute airspace disease, pleural effusion, or pneumothorax. Normal cardiomediastinal silhouette. Degenerative changes of the spine IMPRESSION: No active cardiopulmonary disease. Minimal scarring or atelectasis at the bases Electronically Signed   By: Jasmine Pang M.D.   On: 02/15/2021 21:41   CT Head Wo Contrast  Result Date: 02/16/2021 CLINICAL DATA:  Fall last week.  Bruising around eyes. EXAM: CT HEAD WITHOUT CONTRAST TECHNIQUE: Contiguous axial images were obtained from the base of the skull through the vertex without intravenous contrast. COMPARISON:  None. FINDINGS: Brain: No acute intracranial abnormality. Specifically, no hemorrhage, hydrocephalus, mass lesion, acute infarction, or significant intracranial injury. Vascular:  No hyperdense vessel or unexpected calcification. Skull: No acute calvarial abnormality. Sinuses/Orbits: Orbital soft tissues unremarkable. No visible orbital fracture. Paranasal sinuses clear. Other: None IMPRESSION: No acute intracranial abnormality. Electronically Signed   By: Charlett NoseKevin  Dover M.D.   On: 02/16/2021 17:40   CT CHEST ABDOMEN PELVIS WO CONTRAST  Result Date: 02/16/2021 CLINICAL DATA:  Fall last week, right rib pain.  Low hemoglobin. EXAM: CT CHEST, ABDOMEN AND PELVIS WITHOUT CONTRAST TECHNIQUE: Multidetector CT  imaging of the chest, abdomen and pelvis was performed following the standard protocol without IV contrast. COMPARISON:  None. FINDINGS: CT CHEST FINDINGS Cardiovascular: Left side pacer in place with lead in the right ventricle. Heart is normal size. Coronary artery and aortic calcifications. No aneurysm. Mediastinum/Nodes: No mediastinal, hilar, or axillary adenopathy. Trachea and esophagus are unremarkable. Thyroid unremarkable. Lungs/Pleura: Minimal right base and dependent atelectasis. No confluent opacities or effusions. Musculoskeletal: Multiple old bilateral rib fractures. Acute right rib fractures in the anterolateral 6 through 9th right ribs. No pneumothorax. CT ABDOMEN PELVIS FINDINGS Hepatobiliary: No hepatic injury or perihepatic hematoma. Gallbladder is unremarkable Pancreas: No focal abnormality or ductal dilatation. Spleen: No splenic injury or perisplenic hematoma. Adrenals/Urinary Tract: No adrenal hemorrhage or renal injury identified. Bladder is unremarkable. Stomach/Bowel: Stomach, large and small bowel grossly unremarkable. Vascular/Lymphatic: Aortic atherosclerosis. No evidence of aneurysm or adenopathy. Reproductive: No visible focal abnormality. Other: No free fluid or free air. Musculoskeletal: Prior left hip replacement. No acute bony abnormality. IMPRESSION: Acute fractures through the right anterolateral 6th through 9th ribs. Multiple old bilateral rib fractures. Minimal right base and dependent atelectasis. No pneumothorax. Coronary artery disease, aortic atherosclerosis. No acute findings in the abdomen or pelvis. Electronically Signed   By: Charlett NoseKevin  Dover M.D.   On: 02/16/2021 17:45    Procedures .Critical Care Performed by: Koleen DistanceWright, Kimiyo Carmicheal G, MD Authorized by: Koleen DistanceWright, Kani Jobson G, MD   Critical care provider statement:    Critical care time (minutes):  45   Critical care time was exclusive of:  Separately billable procedures and treating other patients and teaching time   Critical  care was necessary to treat or prevent imminent or life-threatening deterioration of the following conditions:  Circulatory failure, dehydration and shock   Critical care was time spent personally by me on the following activities:  Discussions with consultants, evaluation of patient's response to treatment, examination of patient, ordering and performing treatments and interventions, ordering and review of laboratory studies, ordering and review of radiographic studies, pulse oximetry, re-evaluation of patient's condition, obtaining history from patient or surrogate, review of old charts and development of treatment plan with patient or surrogate   Care discussed with: admitting provider       Medications Ordered in ED Medications  LORazepam (ATIVAN) tablet 1-4 mg (has no administration in time range)    Or  LORazepam (ATIVAN) injection 1-4 mg (has no administration in time range)  thiamine tablet 100 mg (has no administration in time range)    Or  thiamine (B-1) injection 100 mg (has no administration in time range)  folic acid (FOLVITE) tablet 1 mg (has no administration in time range)  multivitamin with minerals tablet 1 tablet (has no administration in time range)  LORazepam (ATIVAN) tablet 1 mg (has no administration in time range)  sodium chloride 0.9 % bolus 250 mL ( Intravenous Stopped 02/16/21 1749)  sodium chloride 0.9 % bolus 500 mL (0 mLs Intravenous Stopped 02/16/21 1958)  oxyCODONE-acetaminophen (PERCOCET/ROXICET) 5-325 MG per tablet 1 tablet (1 tablet Oral Given 02/16/21  2136)    ED Course  I have reviewed the triage vital signs and the nursing notes.  Pertinent labs & imaging results that were available during my care of the patient were reviewed by me and considered in my medical decision making (see chart for details).  Clinical Course as of 02/16/21 2034  Fri Feb 16, 2021  2033 I spoke with Dr. Leafy Half about admission. [AW]    Clinical Course User Index [AW] Koleen Distance, MD   MDM Rules/Calculators/A&P                          [name] presented upon recommendation of his preop anesthesia provider for evaluation of anemia.  When he arrived to the ED, he was noted to be quite hypotensive, and this fact prompted an evaluation of possible etiologies of his hypotension including acute coronary syndrome, acute heart failure decompensation, dehydration, ongoing blood loss, sepsis.  He was given slow, cautious doses of fluid given his history of CHF.  This did seem to improve his blood pressure.  He was noted to be quite hyponatremic and was anemic without evidence of active bleeding either rectally or via trauma scans.  He was found to have multiple rib fractures but has no hypoxia or pneumonia.  He will be admitted to the hospital for ongoing monitoring given his electrolyte abnormalities, his weakness and falls, and his multiple rib fractures.  He will also be monitored for his hypotension to ensure that he is not developing sepsis or other emergent issue. Final Clinical Impression(s) / ED Diagnoses Final diagnoses:  Chest wall contusion  Closed fracture of multiple ribs of right side, initial encounter  Hypotension, unspecified hypotension type  Lactic acidosis  Hyponatremia  Alcoholic intoxication without complication [facility])    Rx / DC Orders ED Discharge Orders    None       Koleen Distance, MD 02/16/21 2036    Koleen Distance, MD 02/16/21 [phone]

## 2021-02-16 NOTE — ED Notes (Signed)
Called [facility] to transport patient to [facility] Rm# [id number]

## 2021-02-17 DIAGNOSIS — F1010 Alcohol abuse, uncomplicated: Secondary | ICD-10-CM | POA: Diagnosis not present

## 2021-02-17 DIAGNOSIS — E871 Hypo-osmolality and hyponatremia: Secondary | ICD-10-CM | POA: Diagnosis not present

## 2021-02-17 DIAGNOSIS — I5042 Chronic combined systolic (congestive) and diastolic (congestive) heart failure: Secondary | ICD-10-CM | POA: Diagnosis not present

## 2021-02-17 DIAGNOSIS — D649 Anemia, unspecified: Secondary | ICD-10-CM | POA: Diagnosis not present

## 2021-02-17 LAB — CBC
HCT: 23.3 % — ABNORMAL LOW (ref 39.0–52.0)
Hemoglobin: 7.4 g/dL — ABNORMAL LOW (ref 13.0–17.0)
MCH: 32.6 pg (ref 26.0–34.0)
MCHC: 31.8 g/dL (ref 30.0–36.0)
MCV: 102.6 fL — ABNORMAL HIGH (ref 80.0–100.0)
Platelets: 378 10*3/uL (ref 150–400)
RBC: 2.27 MIL/uL — ABNORMAL LOW (ref 4.22–5.81)
RDW: 13.8 % (ref 11.5–15.5)
WBC: 8.4 10*3/uL (ref 4.0–10.5)
nRBC: 0.2 % (ref 0.0–0.2)

## 2021-02-17 LAB — BASIC METABOLIC PANEL
Anion gap: 10 (ref 5–15)
BUN: 13 mg/dL (ref 6–20)
CO2: 25 mmol/L (ref 22–32)
Calcium: 8.2 mg/dL — ABNORMAL LOW (ref 8.9–10.3)
Chloride: 92 mmol/L — ABNORMAL LOW (ref 98–111)
Creatinine, Ser: 0.86 mg/dL (ref 0.61–1.24)
GFR, Estimated: >60 mL/min (ref >60)
Glucose, Bld: 114 mg/dL — ABNORMAL HIGH (ref 70–99)
Potassium: 3.1 mmol/L — ABNORMAL LOW (ref 3.5–5.1)
Sodium: 127 mmol/L — ABNORMAL LOW (ref 135–145)

## 2021-02-17 LAB — HEMOGLOBIN AND HEMATOCRIT, BLOOD
HCT: 24.5 % — ABNORMAL LOW (ref 39.0–52.0)
Hemoglobin: 7.8 g/dL — ABNORMAL LOW (ref 13.0–17.0)

## 2021-02-17 LAB — LACTIC ACID, PLASMA
Lactic Acid, Venous: 1 mmol/L (ref 0.5–1.9)
Lactic Acid, Venous: 1.6 mmol/L (ref 0.5–1.9)

## 2021-02-17 LAB — IRON AND TIBC
Iron: 27 ug/dL — ABNORMAL LOW (ref 45–182)
Saturation Ratios: 7 % — ABNORMAL LOW (ref 17.9–39.5)
TIBC: 392 ug/dL (ref 250–450)
UIBC: 365 ug/dL

## 2021-02-17 LAB — MAGNESIUM: Magnesium: 1.8 mg/dL (ref 1.7–2.4)

## 2021-02-17 LAB — VITAMIN B12: Vitamin B-12: 225 pg/mL (ref 180–914)

## 2021-02-17 LAB — LACTATE DEHYDROGENASE: LDH: 147 U/L (ref 98–192)

## 2021-02-17 LAB — FOLATE: Folate: 12.5 ng/mL (ref >5.9)

## 2021-02-17 LAB — SARS CORONAVIRUS 2 (TAT 6-24 HRS): SARS Coronavirus 2: NEGATIVE

## 2021-02-17 MED ORDER — TRAZODONE HCL 50 MG PO TABS
50.0000 mg | ORAL_TABLET | Freq: Every evening | ORAL | Status: DC | PRN
  Administered 2021-02-17 – 2021-02-19 (×3): 50 mg via ORAL
  Filled 2021-02-17 (×3): qty 1

## 2021-02-17 MED ORDER — LORAZEPAM 1 MG PO TABS: 1.0000 mg | ORAL_TABLET | Freq: Every day | ORAL | Status: DC | PRN

## 2021-02-17 MED ORDER — ACETAMINOPHEN 325 MG PO TABS: 650.0000 mg | ORAL_TABLET | Freq: Three times a day (TID) | ORAL | Status: DC | PRN

## 2021-02-17 MED ORDER — OXYCODONE-ACETAMINOPHEN 5-325 MG PO TABS
1.0000 | ORAL_TABLET | ORAL | Status: DC | PRN
  Administered 2021-02-17 – 2021-02-20 (×12): 1 via ORAL
  Filled 2021-02-17 (×12): qty 1

## 2021-02-17 MED ORDER — SODIUM CHLORIDE 0.9 % IV SOLN
250.0000 mg | Freq: Once | INTRAVENOUS | Status: AC
  Administered 2021-02-17: 250 mg via INTRAVENOUS
  Filled 2021-02-17: qty 20

## 2021-02-17 MED ORDER — OXYCODONE-ACETAMINOPHEN 5-325 MG PO TABS
1.0000 | ORAL_TABLET | Freq: Once | ORAL | Status: AC
Start: 2021-02-17 — End: 2021-02-17
  Administered 2021-02-17: 1 via ORAL
  Filled 2021-02-17: qty 1

## 2021-02-17 MED ORDER — ESCITALOPRAM OXALATE 10 MG PO TABS
10.0000 mg | ORAL_TABLET | Freq: Every day | ORAL | Status: DC
  Administered 2021-02-17 – 2021-02-20 (×4): 10 mg via ORAL
  Filled 2021-02-17 (×4): qty 1

## 2021-02-17 MED ORDER — POTASSIUM CHLORIDE CRYS ER 10 MEQ PO TBCR
40.0000 meq | EXTENDED_RELEASE_TABLET | ORAL | Status: AC
  Administered 2021-02-17 (×2): 40 meq via ORAL
  Filled 2021-02-17 (×2): qty 4

## 2021-02-17 MED ORDER — APIXABAN 5 MG PO TABS: 5.0000 mg | ORAL_TABLET | Freq: Two times a day (BID) | ORAL | Status: DC

## 2021-02-17 MED ORDER — ROSUVASTATIN CALCIUM 10 MG PO TABS
20.0000 mg | ORAL_TABLET | Freq: Every day | ORAL | Status: DC
  Administered 2021-02-17 – 2021-02-20 (×4): 20 mg via ORAL
  Filled 2021-02-17 (×4): qty 2

## 2021-02-17 MED ORDER — SODIUM CHLORIDE 0.9 % IV SOLN: INTRAVENOUS | Status: AC

## 2021-02-17 MED ORDER — LIDOCAINE 5 % EX PTCH
1.0000 | MEDICATED_PATCH | CUTANEOUS | Status: DC
  Administered 2021-02-17 – 2021-02-20 (×4): 1 via TRANSDERMAL
  Filled 2021-02-17 (×4): qty 1

## 2021-02-17 NOTE — Hospital Course (Signed)
John [name] is a 60 y.o. male with medical history significant for combined systolic and diastolic heart failure (EF 20-25%) s/p ICD, avascular necrosis of the left hip s/p replacement, anxiety, depression and alcohol abuse who presents as a transfer from outside ED for concerns of hypotension and acute anemia that was identified in pre-op labs for upcoming kyphoplasty.  Hbg was 8.0, down from 14 last month, therefore patient was referred to the ED for evaluation.

## 2021-02-17 NOTE — Progress Notes (Signed)
PROGRESS NOTE    John [name]   MHD:[id number]  DOB: 22-Jul-1961  PCP: Shelva Majestic, MD    DOA: 02/16/2021 LOS: 0   Brief Narrative   John [name] is a 60 y.o. male with medical history significant for combined systolic and diastolic heart failure (EF 20-25%) s/p ICD, avascular necrosis of the left hip s/p replacement, anxiety, depression and alcohol abuse who presents as a transfer from outside ED for concerns of hypotension and acute anemia that was identified in pre-op labs for upcoming kyphoplasty.  Hbg was 8.0, down from 14 last month, therefore patient was referred to the ED for evaluation.      Assessment & Plan   Active Problems:   Paroxysmal atrial fibrillation (HCC)   Chronic combined systolic and diastolic CHF, NYHA class 3 (HCC)   S/P ICD (internal cardiac defibrillator) procedure   Hyponatremia   Acute anemia   Hypotension   ETOH abuse   Acute anemia / Iron deficiency -  Hbg a month ago was 14, found to 8.0 on pre-op labs. Seems unlikely to be GI blood loss by history and negative fecal occult.  Unlikely hemolysis with normal LDH.  Anemia panel consistent with iron deficiency. --Trend Hbg --Transfuse if Hbg < 7.0 --Diet okay for now --Make NPO if any sign of GI bleed --Iron infusion  Hypotension - resolved.  Present on admission thought due to hypovolemia.  Given gentle IV fluids (low EF ~20%). --maintain MAP>65  EtOH Abuse - excessive alcohol use recently, pt says helps pain to be tolerable.  Last drink prior to ED day of admission. 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 Pt endorses drinking a lot of beer and little food. --Given gentle fluids --Encourage PO hydration --Follow BMP --Counseled patient on cause of this  Right Rib Fractures involving anterolateral ribs 6-9 - appears no complications. --lidocaine patch --PO pain meds per orders PRN  Acute T12 Compression Fracture - with plan for  kyphoplasty on 02/22/21 with Dr. Debria Garret.   --Pain control  Right Hip Pain - s/p fall back in March.  Scheduled for Right hip THA but surgeon wanted to do kypho first.  Hx of prior Left THA. --PT --Pain control  Paroxysmal a-fib - rate controlled.   --Hold Eliquis due to anemia and for upcoming kyphoplasty  Chronic Combined Systolic/Diastolic CHF - appears overall euvolemic, compensated on admission. --Meds held on admission due to low BP: Entresto, Coreg, Aldactone, Demadex  Anxiety/Depression - continue SSRI  Obesity: Body mass index is 38.13 kg/m.  Complicates overall care and prognosis.  Recommend lifestyle modifications including physical activity and diet for weight loss and overall long-term health.   DVT prophylaxis: SCDs Start: 02/16/21 2256   Diet:  Diet Orders (From admission, onward)    Start     Ordered   02/17/21 1259  Diet Heart Room service appropriate? Yes; Fluid consistency: Thin  Diet effective now       Question Answer Comment  Room service appropriate? Yes   Fluid consistency: Thin      02/17/21 1258            Code Status: Full Code    Subjective 02/17/21    Pt feels well today.  He denies any recent signs of bleeding other than bruising from his fall.  No melena, hematochezia, hematemesis, hematuria.  Asks if kyphoplasty can still be done as scheduled.   Disposition Plan & Communication   Status is: Observation  The patient remains OBS appropriate and will d/c before 2 midnights.  Dispo: The patient is from: Home              Anticipated d/c is to: Home              Patient currently is not medically stable to d/c.   Difficult to place patient No   Consults, Procedures, Significant Events   Consultants:   None  Procedures:   None  Antimicrobials:  Anti-infectives (From admission, onward)   None        Micro    Objective   Vitals:   02/17/21 0625 02/17/21 0711 02/17/21 1136 02/17/21 1439  BP: 116/63 109/73 (!) 106/57  (!) 108/58  Pulse: 69 64 63 (!) 57  Resp: 18 18 19 18   Temp: 97.9 F (36.6 C) 98.1 F (36.7 C) 98.8 F (37.1 C) 98.6 F (37 C)  TempSrc:   Oral   SpO2: 100% 98% 98% 96%  Weight:      Height:        Intake/Output Summary (Last 24 hours) at 02/17/2021 1711 Last data filed at 02/17/2021 1500 Gross per 24 hour  Intake 1275.56 ml  Output 1975 ml  Net -699.44 ml   Filed Weights   02/16/21 1646  Weight: 124 kg    Physical Exam:  General exam: awake, alert, no acute distress, obese HEENT: pale conjunctiva and mucus membranes, anicteric sclera, moist mucus membranes, hearing grossly normal  Respiratory system: CTAB, no wheezes, rales or rhonchi, normal respiratory effort. Cardiovascular system: normal S1/S2, RRR, no JVD, murmurs, rubs, gallops, no pedal edema.   Gastrointestinal system: soft, NT, ND, no HSM felt, +bowel sounds. Central nervous system: A&O x4. no gross focal neurologic deficits, normal speech Psychiatry: normal mood, congruent affect, judgement and insight appear normal  Labs   Data Reviewed: I have personally reviewed following labs and imaging studies  CBC: Recent Labs  Lab 02/15/21 1400 02/16/21 1648 02/17/21 0011 02/17/21 1141  WBC 12.4* 10.3 8.4  --   NEUTROABS  --  7.0  --   --   HGB 8.0* 8.1* 7.4* 7.8*  HCT 25.5* 25.0* 23.3* 24.5*  MCV 104.1* 101.6* 102.6*  --   PLT 514* 479* 378  --    Basic Metabolic Panel: Recent Labs  Lab 02/15/21 1400 02/16/21 1648 02/17/21 0011 02/17/21 1024  NA 132* 126* 127*  --   K 3.2* 3.2* 3.1*  --   CL 93* 87* 92*  --   CO2 26 25 25   --   GLUCOSE 118* 124* 114*  --   BUN 7 11 13   --   CREATININE 1.01 1.04 0.86  --   CALCIUM 8.5* 8.3* 8.2*  --   MG  --   --   --  1.8   GFR: Estimated Creatinine Clearance: 124 mL/min (by C-G formula based on SCr of 0.86 mg/dL). Liver Function Tests: Recent Labs  Lab 02/16/21 1648  AST 16  ALT 14  ALKPHOS 57  BILITOT 0.3  PROT 6.2*  ALBUMIN 3.7   Recent Labs  Lab  02/16/21 1648  LIPASE 29   No results for input(s): AMMONIA in the last 168 hours. Coagulation Profile: Recent Labs  Lab 02/16/21 1648  INR 1.1   Cardiac Enzymes: No results for input(s): CKTOTAL, CKMB, CKMBINDEX, TROPONINI in the last 168 hours. BNP (last 3 results) No results for input(s): PROBNP in the last 8760 hours. HbA1C: No results for input(s): HGBA1C in the  last 72 hours. CBG: No results for input(s): GLUCAP in the last 168 hours. Lipid Profile: No results for input(s): CHOL, HDL, LDLCALC, TRIG, CHOLHDL, LDLDIRECT in the last 72 hours. Thyroid Function Tests: No results for input(s): TSH, T4TOTAL, FREET4, T3FREE, THYROIDAB in the last 72 hours. Anemia Panel: Recent Labs    02/17/21 0011  VITAMINB12 225  FOLATE 12.5  TIBC 392  IRON 27*   Sepsis Labs: Recent Labs  Lab 02/16/21 1747 02/16/21 1910 02/17/21 0011 02/17/21 0248  LATICACIDVEN 2.4* 2.0* 1.6 1.0    Recent Results (from the past 240 hour(s))  Surgical pcr screen     Status: None   Collection Time: 02/15/21  1:26 PM   Specimen: Nasal Mucosa; Nasal Swab  Result Value Ref Range Status   MRSA, PCR NEGATIVE NEGATIVE Final   Staphylococcus aureus NEGATIVE NEGATIVE Final    Comment: (NOTE) The Xpert SA Assay (FDA approved for NASAL specimens in patients 60 years of age and older), is one component of a comprehensive surveillance program. It is not intended to diagnose infection nor to guide or monitor treatment. Performed at [facility], [address] [place], [place] [address]   Blood culture (routine x 2)     Status: None (Preliminary result)   Collection Time: 02/16/21  7:05 PM   Specimen: BLOOD  Result Value Ref Range Status   Specimen Description   Final    BLOOD LEFT ANTECUBITAL Performed at [facility], [address], [place], [place] [address]    Special Requests   Final    BOTTLES DRAWN AEROBIC AND ANAEROBIC Blood Culture adequate volume Performed at  [facility], [address], [place], [place] [address]    Culture   Final    NO GROWTH < 24 HOURS Performed at [facility], [address] [place], [place] [address]    Report Status PENDING  Incomplete  SARS CORONAVIRUS 2 (TAT 6-24 HRS) Nasopharyngeal Peripheral     Status: None   Collection Time: 02/16/21  7:05 PM   Specimen: Peripheral; Nasopharyngeal  Result Value Ref Range Status   SARS Coronavirus 2 NEGATIVE NEGATIVE Final    Comment: (NOTE) SARS-CoV-2 target nucleic acids are NOT DETECTED.  The SARS-CoV-2 RNA is generally detectable in upper and lower respiratory specimens during the acute phase of infection. Negative results do not preclude SARS-CoV-2 infection, do not rule out co-infections with other pathogens, and should not be used as the sole basis for treatment or other patient management decisions. Negative results must be combined with clinical observations, patient history, and epidemiological information. The expected result is Negative.  Fact Sheet for Patients: HairSlick.nohttps://www.fda.gov/media/138098/download  Fact Sheet for Healthcare Providers: quierodirigir.comhttps://www.fda.gov/media/138095/download  This test is not yet approved or cleared by the [place] FDA and  has been authorized for detection and/or diagnosis of SARS-CoV-2 by FDA under an Emergency Use Authorization (EUA). This EUA will remain  in effect (meaning this test can be used) for the duration of the COVID-19 declaration under Se ction 564(b)(1) of the Act, 21 U.S.C. section 360bbb-3(b)(1), unless the authorization is terminated or revoked sooner.  Performed at [facility], [address] [place], [place] [address]   Blood culture (routine x 2)     Status: None (Preliminary result)   Collection Time: 02/16/21  7:10 PM   Specimen: BLOOD  Result Value Ref Range Status   Specimen Description   Final    BLOOD RIGHT HAND Performed at [facility],  [address] [place], [place] [address]    Special Requests   Final    BOTTLES DRAWN AEROBIC AND ANAEROBIC Blood Culture adequate volume Performed at [facility], [address] [place], [place] [address]    Culture   Final    NO GROWTH < 24 HOURS Performed at [facility], [address], [place], [place] [address]    Report Status PENDING  Incomplete      Imaging Studies   CT Head Wo Contrast  Result Date: 02/16/2021 CLINICAL DATA:  Fall last week.  Bruising around eyes. EXAM: CT HEAD WITHOUT CONTRAST TECHNIQUE: Contiguous axial images were obtained from the base of the skull through the vertex without intravenous contrast. COMPARISON:  None. FINDINGS: Brain: No acute intracranial abnormality. Specifically, no hemorrhage, hydrocephalus, mass lesion, acute infarction, or significant intracranial injury. Vascular: No hyperdense vessel or unexpected calcification. Skull: No acute calvarial abnormality. Sinuses/Orbits: Orbital soft tissues unremarkable. No visible orbital fracture. Paranasal sinuses clear. Other: None IMPRESSION: No acute intracranial abnormality. Electronically Signed   By: Charlett Nose M.D.   On: 02/16/2021 17:40   CT CHEST ABDOMEN PELVIS WO CONTRAST  Result Date: 02/16/2021 CLINICAL DATA:  Fall last week, right rib pain.  Low hemoglobin. EXAM: CT CHEST, ABDOMEN AND PELVIS WITHOUT CONTRAST TECHNIQUE: Multidetector CT imaging of the chest, abdomen and pelvis was performed following the standard protocol without IV contrast. COMPARISON:  None. FINDINGS: CT CHEST FINDINGS Cardiovascular: Left side pacer in place with lead in the right ventricle. Heart is normal size. Coronary artery and aortic calcifications. No aneurysm. Mediastinum/Nodes: No mediastinal, hilar, or axillary adenopathy. Trachea and esophagus are unremarkable. Thyroid unremarkable. Lungs/Pleura: Minimal right base and dependent atelectasis. No confluent opacities or effusions.  Musculoskeletal: Multiple old bilateral rib fractures. Acute right rib fractures in the anterolateral 6 through 9th right ribs. No pneumothorax. CT ABDOMEN PELVIS FINDINGS Hepatobiliary: No hepatic injury or perihepatic hematoma. Gallbladder is unremarkable Pancreas: No focal abnormality or ductal dilatation. Spleen: No splenic injury or perisplenic hematoma. Adrenals/Urinary Tract: No adrenal hemorrhage or renal injury identified. Bladder is unremarkable. Stomach/Bowel: Stomach, large and small bowel grossly unremarkable. Vascular/Lymphatic: Aortic atherosclerosis. No evidence of aneurysm or adenopathy. Reproductive: No visible focal abnormality. Other: No free fluid or free air. Musculoskeletal: Prior left hip replacement. No acute bony abnormality. IMPRESSION: Acute fractures through the right anterolateral 6th through 9th ribs. Multiple old bilateral rib fractures. Minimal right base and dependent atelectasis. No pneumothorax. Coronary artery disease, aortic atherosclerosis. No acute findings in the abdomen or pelvis. Electronically Signed   By: Charlett Nose M.D.   On: 02/16/2021 17:45     Medications   Scheduled Meds: . escitalopram  10 mg Oral Daily  . folic acid  1 mg Oral Daily  . lidocaine  1 patch Transdermal Q24H  . multivitamin with minerals  1 tablet Oral Daily  . rosuvastatin  20 mg Oral Daily  . thiamine  100 mg Oral Daily   Or  . thiamine  100 mg Intravenous Daily   Continuous Infusions:      LOS: 0 days    Time spent: 30 minutes    Pennie Banter, DO [facility]  02/17/2021, 5:11 PM      If 7PM-7AM, please contact night-coverage. How to contact the [facility] Attending or Consulting provider 7A - 7P or covering provider during after hours 7P -7A, for this patient?    1. Check the care team in [facility] and look for a) attending/consulting  [facility] provider listed and b) the [facility] team listed 2. Log into [email or web address] and use [facility] universal password to access. If  you do not have the password, please contact the hospital operator. 3. Locate the [facility] provider you are looking for under [facility] and page to a number that you can be directly reached. 4. If you still have difficulty reaching the provider, please page the [facility] (Director on Call) for the Hospitalists listed on amion for assistance.

## 2021-02-17 NOTE — Plan of Care (Signed)
  Problem: Pain Managment: Goal: General experience of comfort will improve Outcome: Progressing   Problem: Safety: Goal: Ability to remain free from injury will improve Outcome: Progressing   Problem: Skin Integrity: Goal: Risk for impaired skin integrity will decrease Outcome: Progressing   

## 2021-02-18 DIAGNOSIS — D539 Nutritional anemia, unspecified: Secondary | ICD-10-CM | POA: Diagnosis present

## 2021-02-18 DIAGNOSIS — D509 Iron deficiency anemia, unspecified: Secondary | ICD-10-CM | POA: Diagnosis present

## 2021-02-18 DIAGNOSIS — E871 Hypo-osmolality and hyponatremia: Secondary | ICD-10-CM | POA: Diagnosis present

## 2021-02-18 DIAGNOSIS — F1010 Alcohol abuse, uncomplicated: Secondary | ICD-10-CM | POA: Diagnosis present

## 2021-02-18 DIAGNOSIS — I959 Hypotension, unspecified: Secondary | ICD-10-CM | POA: Diagnosis present

## 2021-02-18 DIAGNOSIS — J302 Other seasonal allergic rhinitis: Secondary | ICD-10-CM | POA: Diagnosis present

## 2021-02-18 DIAGNOSIS — I5042 Chronic combined systolic (congestive) and diastolic (congestive) heart failure: Secondary | ICD-10-CM | POA: Diagnosis present

## 2021-02-18 DIAGNOSIS — Y906 Blood alcohol level of 120-199 mg/100 ml: Secondary | ICD-10-CM | POA: Diagnosis present

## 2021-02-18 DIAGNOSIS — G4730 Sleep apnea, unspecified: Secondary | ICD-10-CM | POA: Diagnosis present

## 2021-02-18 DIAGNOSIS — S2241XA Multiple fractures of ribs, right side, initial encounter for closed fracture: Secondary | ICD-10-CM | POA: Diagnosis present

## 2021-02-18 DIAGNOSIS — F32A Depression, unspecified: Secondary | ICD-10-CM | POA: Diagnosis present

## 2021-02-18 DIAGNOSIS — E872 Acidosis: Secondary | ICD-10-CM | POA: Diagnosis not present

## 2021-02-18 DIAGNOSIS — Z96612 Presence of left artificial shoulder joint: Secondary | ICD-10-CM | POA: Diagnosis present

## 2021-02-18 DIAGNOSIS — E669 Obesity, unspecified: Secondary | ICD-10-CM | POA: Diagnosis present

## 2021-02-18 DIAGNOSIS — Z20822 Contact with and (suspected) exposure to covid-19: Secondary | ICD-10-CM | POA: Diagnosis present

## 2021-02-18 DIAGNOSIS — Z95810 Presence of automatic (implantable) cardiac defibrillator: Secondary | ICD-10-CM | POA: Diagnosis not present

## 2021-02-18 DIAGNOSIS — E861 Hypovolemia: Secondary | ICD-10-CM | POA: Diagnosis present

## 2021-02-18 DIAGNOSIS — I110 Hypertensive heart disease with heart failure: Secondary | ICD-10-CM | POA: Diagnosis present

## 2021-02-18 DIAGNOSIS — M25551 Pain in right hip: Secondary | ICD-10-CM | POA: Diagnosis present

## 2021-02-18 DIAGNOSIS — I480 Paroxysmal atrial fibrillation: Secondary | ICD-10-CM | POA: Diagnosis present

## 2021-02-18 DIAGNOSIS — G8929 Other chronic pain: Secondary | ICD-10-CM | POA: Diagnosis present

## 2021-02-18 DIAGNOSIS — D649 Anemia, unspecified: Secondary | ICD-10-CM | POA: Diagnosis not present

## 2021-02-18 DIAGNOSIS — Z6838 Body mass index (BMI) 38.0-38.9, adult: Secondary | ICD-10-CM | POA: Diagnosis not present

## 2021-02-18 DIAGNOSIS — Z96642 Presence of left artificial hip joint: Secondary | ICD-10-CM | POA: Diagnosis present

## 2021-02-18 DIAGNOSIS — Z96611 Presence of right artificial shoulder joint: Secondary | ICD-10-CM | POA: Diagnosis present

## 2021-02-18 DIAGNOSIS — F419 Anxiety disorder, unspecified: Secondary | ICD-10-CM | POA: Diagnosis present

## 2021-02-18 LAB — MAGNESIUM: Magnesium: 2.1 mg/dL (ref 1.7–2.4)

## 2021-02-18 LAB — BLOOD CULTURE ID PANEL (REFLEXED) - BCID2

## 2021-02-18 LAB — BASIC METABOLIC PANEL
Anion gap: 6 (ref 5–15)
BUN: 9 mg/dL (ref 6–20)
CO2: 28 mmol/L (ref 22–32)
Calcium: 8.9 mg/dL (ref 8.9–10.3)
Chloride: 100 mmol/L (ref 98–111)
Creatinine, Ser: 0.82 mg/dL (ref 0.61–1.24)
GFR, Estimated: >60 mL/min (ref >60)
Glucose, Bld: 112 mg/dL — ABNORMAL HIGH (ref 70–99)
Potassium: 4.4 mmol/L (ref 3.5–5.1)
Sodium: 134 mmol/L — ABNORMAL LOW (ref 135–145)

## 2021-02-18 LAB — CBC
HCT: 25.4 % — ABNORMAL LOW (ref 39.0–52.0)
Hemoglobin: 7.5 g/dL — ABNORMAL LOW (ref 13.0–17.0)
MCH: 32.2 pg (ref 26.0–34.0)
MCHC: 29.5 g/dL — ABNORMAL LOW (ref 30.0–36.0)
MCV: 109 fL — ABNORMAL HIGH (ref 80.0–100.0)
Platelets: 337 10*3/uL (ref 150–400)
RBC: 2.33 MIL/uL — ABNORMAL LOW (ref 4.22–5.81)
RDW: 14.3 % (ref 11.5–15.5)
WBC: 8.5 10*3/uL (ref 4.0–10.5)
nRBC: 0.4 % — ABNORMAL HIGH (ref 0.0–0.2)

## 2021-02-18 LAB — HEMOGLOBIN AND HEMATOCRIT, BLOOD
HCT: 29 % — ABNORMAL LOW (ref 39.0–52.0)
Hemoglobin: 8.8 g/dL — ABNORMAL LOW (ref 13.0–17.0)

## 2021-02-18 LAB — PREPARE RBC (CROSSMATCH)

## 2021-02-18 MED ORDER — SODIUM CHLORIDE 0.9% IV SOLUTION: Freq: Once | INTRAVENOUS | Status: AC

## 2021-02-18 NOTE — Progress Notes (Addendum)
PROGRESS NOTE    John [name]   WUJ:[id number]  DOB: [birth year]  PCP: Shelva Majestic, MD    DOA: 02/16/2021 LOS: 0   Brief Narrative   Erric Machnik is a 60 y.o. male with medical history significant for combined systolic and diastolic heart failure (EF 20-25%) s/p ICD, avascular necrosis of the left hip s/p replacement, anxiety, depression and alcohol abuse who presents as a transfer from outside ED for concerns of hypotension and acute anemia that was identified in pre-op labs for upcoming kyphoplasty.  Hbg was 8.0, down from 14 last month, therefore patient was referred to the ED for evaluation.      Assessment & Plan   Active Problems:   Paroxysmal atrial fibrillation (HCC)   Chronic combined systolic and diastolic CHF, NYHA class 3 (HCC)   S/P ICD (internal cardiac defibrillator) procedure   Hyponatremia   Acute anemia   Hypotension   ETOH abuse   Acute macrocytic anemia / Iron deficiency -  Hbg a month ago was 14, found to 8.0 on pre-op labs. Unlikely to be GI blood loss by history and negative fecal occult.   Unlikely hemolysis with normal LDH.   Anemia panel consistent with iron deficiency, normal folate and B12.  Macrocytsis due to heavy alcohol use. Hbg trend: 8.1 >> 7.4 >>7.8 >>7.5 6/5 given his cardiac history and upcoming procedures, transfuse 1 unit --Trend Hbg --Goal of Hbg around ~9.0 --Diet okay for now --Make NPO if any sign of GI bleed --Iron infusion on 6/4  Hypotension - resolved.  Present on admission thought due to hypovolemia.  Given gentle IV fluids (low EF ~20%). --maintain MAP>65  EtOH Abuse - excessive alcohol use recently, pt says helps pain to be tolerable.  Last drink prior to ED day of admission. 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 Pt endorses drinking a lot of beer and little food. --Given gentle fluids - stopped --Encourage PO hydration --Follow BMP --Counseled  patient on cause of this  Right Rib Fractures involving anterolateral ribs 6-9 - appears no complications. --lidocaine patch --PO pain meds per orders PRN  Acute T12 Compression Fracture - with plan for kyphoplasty on 02/22/21 with Dr. Debria Garret.   --Pain control  Right Hip Pain - s/p fall back in March.  Scheduled for Right hip THA but surgeon wanted to do kypho first.  Hx of prior Left THA. --PT --Pain control  Paroxysmal a-fib - rate controlled.   --Hold Eliquis due to anemia and for upcoming kyphoplasty  Chronic Combined Systolic/Diastolic CHF - appears overall euvolemic, compensated on admission. --Meds held on admission due to low BP: Entresto, Coreg, Aldactone, Demadex  Anxiety/Depression - continue SSRI  Obesity: Body mass index is 38.13 kg/m.  Complicates overall care and prognosis.  Recommend lifestyle modifications including physical activity and diet for weight loss and overall long-term health.   DVT prophylaxis: SCDs Start: 02/16/21 2256   Diet:  Diet Orders (From admission, onward)    Start     Ordered   02/17/21 1259  Diet Heart Room service appropriate? Yes; Fluid consistency: Thin  Diet effective now       Question Answer Comment  Room service appropriate? Yes   Fluid consistency: Thin      02/17/21 1258            Code Status: Full Code    Subjective 02/18/21    Pt feels well today.  He denies any  recent signs of bleeding other than bruising from his fall.  No melena, hematochezia, hematemesis, hematuria.  Asks if kyphoplasty can still be done as scheduled.   Disposition Plan & Communication   Status is: Inpatient  Inpatient appropriate due to severity of illness, acute severe anemia requiring transfusion. Ongoing evaluation  Dispo: The patient is from: home              Anticipated d/c is to: home              Patient currently not medically stable for d/c   Difficult to place patient no    Consults, Procedures, Significant Events    Consultants:   None  Procedures:   None  Antimicrobials:  Anti-infectives (From admission, onward)   None        Micro    Objective   Vitals:   02/18/21 1211 02/18/21 1451 02/18/21 1500 02/18/21 1512  BP: (!) 102/55 119/69 119/69 (!) 114/50  Pulse: 60 74 74 69  Resp: 20 (!) 21 (!) 21 18  Temp: 98 F (36.7 C) 97.7 F (36.5 C) 97.7 F (36.5 C) (!) 97.4 F (36.3 C)  TempSrc: Axillary Oral Oral Oral  SpO2: 94% 99% 99% 98%  Weight:      Height:        Intake/Output Summary (Last 24 hours) at 02/18/2021 1525 Last data filed at 02/18/2021 1500 Gross per 24 hour  Intake 860 ml  Output 2900 ml  Net -2040 ml   Filed Weights   02/16/21 1646  Weight: 124 kg    Physical Exam:  General exam: awake, alert, no acute distress, obese Respiratory system: normal respiratory effort, on room air. Cardiovascular system: RRR, no pedal edema.   Gastrointestinal system: protuberant abdomen, non-tender Central nervous system: A&O x4. no gross focal neurologic deficits, normal speech Psychiatry: normal mood, congruent affect, judgement and insight appear normal  Labs   Data Reviewed: I have personally reviewed following labs and imaging studies  CBC: Recent Labs  Lab 02/15/21 1400 02/16/21 1648 02/17/21 0011 02/17/21 1141 02/18/21 0552  WBC 12.4* 10.3 8.4  --  8.5  NEUTROABS  --  7.0  --   --   --   HGB 8.0* 8.1* 7.4* 7.8* 7.5*  HCT 25.5* 25.0* 23.3* 24.5* 25.4*  MCV 104.1* 101.6* 102.6*  --  109.0*  PLT 514* 479* 378  --  337   Basic Metabolic Panel: Recent Labs  Lab 02/15/21 1400 02/16/21 1648 02/17/21 0011 02/17/21 1024 02/18/21 0552  NA 132* 126* 127*  --  134*  K 3.2* 3.2* 3.1*  --  4.4  CL 93* 87* 92*  --  100  CO2 26 25 25   --  28  GLUCOSE 118* 124* 114*  --  112*  BUN 7 11 13   --  9  CREATININE 1.01 1.04 0.86  --  0.82  CALCIUM 8.5* 8.3* 8.2*  --  8.9  MG  --   --   --  1.8 2.1   GFR: Estimated Creatinine Clearance: 130.1 mL/min (by C-G  formula based on SCr of 0.82 mg/dL). Liver Function Tests: Recent Labs  Lab 02/16/21 1648  AST 16  ALT 14  ALKPHOS 57  BILITOT 0.3  PROT 6.2*  ALBUMIN 3.7   Recent Labs  Lab 02/16/21 1648  LIPASE 29   No results for input(s): AMMONIA in the last 168 hours. Coagulation Profile: Recent Labs  Lab 02/16/21 1648  INR 1.1   Cardiac Enzymes: No  results for input(s): CKTOTAL, CKMB, CKMBINDEX, TROPONINI in the last 168 hours. BNP (last 3 results) No results for input(s): PROBNP in the last 8760 hours. HbA1C: No results for input(s): HGBA1C in the last 72 hours. CBG: No results for input(s): GLUCAP in the last 168 hours. Lipid Profile: No results for input(s): CHOL, HDL, LDLCALC, TRIG, CHOLHDL, LDLDIRECT in the last 72 hours. Thyroid Function Tests: No results for input(s): TSH, T4TOTAL, FREET4, T3FREE, THYROIDAB in the last 72 hours. Anemia Panel: Recent Labs    02/17/21 0011  VITAMINB12 225  FOLATE 12.5  TIBC 392  IRON 27*   Sepsis Labs: Recent Labs  Lab 02/16/21 1747 02/16/21 1910 02/17/21 0011 02/17/21 0248  LATICACIDVEN 2.4* 2.0* 1.6 1.0    Recent Results (from the past 240 hour(s))  Surgical pcr screen     Status: None   Collection Time: 02/15/21  1:26 PM   Specimen: Nasal Mucosa; Nasal Swab  Result Value Ref Range Status   MRSA, PCR NEGATIVE NEGATIVE Final   Staphylococcus aureus NEGATIVE NEGATIVE Final    Comment: (NOTE) The Xpert SA Assay (FDA approved for NASAL specimens in patients 60 years of age and older), is one component of a comprehensive surveillance program. It is not intended to diagnose infection nor to guide or monitor treatment. Performed at [facility], [address] [place], [place] [address]   Blood culture (routine x 2)     Status: None (Preliminary result)   Collection Time: 02/16/21  7:05 PM   Specimen: BLOOD  Result Value Ref Range Status   Specimen Description   Final    BLOOD LEFT ANTECUBITAL Performed at [facility], [address], [place], [place] [address]    Special Requests   Final    BOTTLES DRAWN AEROBIC AND ANAEROBIC Blood Culture adequate volume Performed at [facility], [address], [place], [place] [address]    Culture  Setup Time   Final    GRAM POSITIVE COCCI ANAEROBIC BOTTLE ONLY CRITICAL RESULT CALLED TO, READ BACK BY AND VERIFIED WITH: PHARMD MICHELLE LILLISTAN 02/18/2021 AT 0028 A.HUGHES    Culture   Final    GRAM POSITIVE COCCI CULTURE REINCUBATED FOR BETTER GROWTH Performed at [facility], [address] [place], [place] [address]    Report Status PENDING  Incomplete  SARS CORONAVIRUS 2 (TAT 6-24 HRS) Nasopharyngeal Peripheral     Status: None   Collection Time: 02/16/21  7:05 PM   Specimen: Peripheral; Nasopharyngeal  Result Value Ref Range Status   SARS Coronavirus 2 NEGATIVE NEGATIVE Final    Comment: (NOTE) SARS-CoV-2 target nucleic acids are NOT DETECTED.  The SARS-CoV-2 RNA is generally detectable in upper and lower respiratory specimens during the acute phase of infection. Negative results do not preclude SARS-CoV-2 infection, do not rule out co-infections with other pathogens, and should not be used as the sole basis for treatment or other patient management decisions. Negative results must be combined with clinical observations, patient history, and epidemiological information. The expected result is Negative.  Fact Sheet for Patients: HairSlick.nohttps://www.fda.gov/media/138098/download  Fact Sheet for Healthcare Providers: quierodirigir.comhttps://www.fda.gov/media/138095/download  This test is not yet approved or cleared by the [place] FDA and  has been authorized for detection and/or diagnosis of SARS-CoV-2 by FDA under an Emergency Use Authorization (EUA). This EUA will remain  in effect (meaning this test can be used) for the duration of the COVID-19 declaration under Se ction 564(b)(1) of the Act, 21  U.S.C.  section 360bbb-3(b)(1), unless the authorization is terminated or revoked sooner.  Performed at [facility], [address] [place], [place] [address]   Blood Culture ID Panel (Reflexed)     Status: Abnormal   Collection Time: 02/16/21  7:05 PM  Result Value Ref Range Status   Enterococcus faecalis NOT DETECTED NOT DETECTED Final   Enterococcus Faecium NOT DETECTED NOT DETECTED Final   Listeria monocytogenes NOT DETECTED NOT DETECTED Final   Staphylococcus species DETECTED (A) NOT DETECTED Final    Comment: CRITICAL RESULT CALLED TO, READ BACK BY AND VERIFIED WITH: PHARMD MICHELLE LILLISTAN 02/18/2021 AT 0028 A.HUGHES    Staphylococcus aureus (BCID) NOT DETECTED NOT DETECTED Final   Staphylococcus epidermidis DETECTED (A) NOT DETECTED Final    Comment: Methicillin (oxacillin) resistant coagulase negative staphylococcus. Possible blood culture contaminant (unless isolated from more than one blood culture draw or clinical case suggests pathogenicity). No antibiotic treatment is indicated for blood  culture contaminants. CRITICAL RESULT CALLED TO, READ BACK BY AND VERIFIED WITH: PHARMD MICHELLE LILLISTAN 02/18/2021 AT 0028 A.HUGHES    Staphylococcus lugdunensis NOT DETECTED NOT DETECTED Final   Streptococcus species NOT DETECTED NOT DETECTED Final   Streptococcus agalactiae NOT DETECTED NOT DETECTED Final   Streptococcus pneumoniae NOT DETECTED NOT DETECTED Final   Streptococcus pyogenes NOT DETECTED NOT DETECTED Final   A.calcoaceticus-baumannii NOT DETECTED NOT DETECTED Final   Bacteroides fragilis NOT DETECTED NOT DETECTED Final   Enterobacterales NOT DETECTED NOT DETECTED Final   Enterobacter cloacae complex NOT DETECTED NOT DETECTED Final   Escherichia coli NOT DETECTED NOT DETECTED Final   Klebsiella aerogenes NOT DETECTED NOT DETECTED Final   Klebsiella oxytoca NOT DETECTED NOT DETECTED Final   Klebsiella pneumoniae NOT DETECTED NOT DETECTED Final   Proteus  species NOT DETECTED NOT DETECTED Final   Salmonella species NOT DETECTED NOT DETECTED Final   Serratia marcescens NOT DETECTED NOT DETECTED Final   Haemophilus influenzae NOT DETECTED NOT DETECTED Final   Neisseria meningitidis NOT DETECTED NOT DETECTED Final   Pseudomonas aeruginosa NOT DETECTED NOT DETECTED Final   Stenotrophomonas maltophilia NOT DETECTED NOT DETECTED Final   Candida albicans NOT DETECTED NOT DETECTED Final   Candida auris NOT DETECTED NOT DETECTED Final   Candida glabrata NOT DETECTED NOT DETECTED Final   Candida krusei NOT DETECTED NOT DETECTED Final   Candida parapsilosis NOT DETECTED NOT DETECTED Final   Candida tropicalis NOT DETECTED NOT DETECTED Final   Cryptococcus neoformans/gattii NOT DETECTED NOT DETECTED Final   Methicillin resistance mecA/C DETECTED (A) NOT DETECTED Final    Comment: CRITICAL RESULT CALLED TO, READ BACK BY AND VERIFIED WITH: PHARMD MICHELLE LILLISTAN 03/10/2021 AT 0028 A.HUGHES Performed at [facility], [address] [place], [place] [address]   Blood culture (routine x 2)     Status: None (Preliminary result)   Collection Time: 02/16/21  7:10 PM   Specimen: BLOOD  Result Value Ref Range Status   Specimen Description   Final    BLOOD RIGHT HAND Performed at [facility], [address] [place], [place] [address]    Special Requests   Final    BOTTLES DRAWN AEROBIC AND ANAEROBIC Blood Culture adequate volume Performed at [facility], [address] [place], [place] [address]    Culture  Setup Time   Final    GRAM POSITIVE COCCI IN BOTH AEROBIC AND ANAEROBIC BOTTLES CRITICAL RESULT CALLED TO, READ BACK BY AND VERIFIED WITH: PHARMD MICHELLE LILLISTAN 02/18/2021 AT 0028 A.HUGHES  Culture   Final    GRAM POSITIVE COCCI CULTURE REINCUBATED FOR BETTER GROWTH Performed at [facility], [address] [place], [place] [address]    Report Status PENDING  Incomplete       Imaging Studies   CT Head Wo Contrast  Result Date: 02/16/2021 CLINICAL DATA:  Fall last week.  Bruising around eyes. EXAM: CT HEAD WITHOUT CONTRAST TECHNIQUE: Contiguous axial images were obtained from the base of the skull through the vertex without intravenous contrast. COMPARISON:  None. FINDINGS: Brain: No acute intracranial abnormality. Specifically, no hemorrhage, hydrocephalus, mass lesion, acute infarction, or significant intracranial injury. Vascular: No hyperdense vessel or unexpected calcification. Skull: No acute calvarial abnormality. Sinuses/Orbits: Orbital soft tissues unremarkable. No visible orbital fracture. Paranasal sinuses clear. Other: None IMPRESSION: No acute intracranial abnormality. Electronically Signed   By: Charlett Nose M.D.   On: 02/16/2021 17:40   CT CHEST ABDOMEN PELVIS WO CONTRAST  Result Date: 02/16/2021 CLINICAL DATA:  Fall last week, right rib pain.  Low hemoglobin. EXAM: CT CHEST, ABDOMEN AND PELVIS WITHOUT CONTRAST TECHNIQUE: Multidetector CT imaging of the chest, abdomen and pelvis was performed following the standard protocol without IV contrast. COMPARISON:  None. FINDINGS: CT CHEST FINDINGS Cardiovascular: Left side pacer in place with lead in the right ventricle. Heart is normal size. Coronary artery and aortic calcifications. No aneurysm. Mediastinum/Nodes: No mediastinal, hilar, or axillary adenopathy. Trachea and esophagus are unremarkable. Thyroid unremarkable. Lungs/Pleura: Minimal right base and dependent atelectasis. No confluent opacities or effusions. Musculoskeletal: Multiple old bilateral rib fractures. Acute right rib fractures in the anterolateral 6 through 9th right ribs. No pneumothorax. CT ABDOMEN PELVIS FINDINGS Hepatobiliary: No hepatic injury or perihepatic hematoma. Gallbladder is unremarkable Pancreas: No focal abnormality or ductal dilatation. Spleen: No splenic injury or perisplenic hematoma. Adrenals/Urinary Tract: No adrenal hemorrhage  or renal injury identified. Bladder is unremarkable. Stomach/Bowel: Stomach, large and small bowel grossly unremarkable. Vascular/Lymphatic: Aortic atherosclerosis. No evidence of aneurysm or adenopathy. Reproductive: No visible focal abnormality. Other: No free fluid or free air. Musculoskeletal: Prior left hip replacement. No acute bony abnormality. IMPRESSION: Acute fractures through the right anterolateral 6th through 9th ribs. Multiple old bilateral rib fractures. Minimal right base and dependent atelectasis. No pneumothorax. Coronary artery disease, aortic atherosclerosis. No acute findings in the abdomen or pelvis. Electronically Signed   By: Charlett Nose M.D.   On: 02/16/2021 17:45     Medications   Scheduled Meds: . escitalopram  10 mg Oral Daily  . folic acid  1 mg Oral Daily  . lidocaine  1 patch Transdermal Q24H  . multivitamin with minerals  1 tablet Oral Daily  . rosuvastatin  20 mg Oral Daily  . thiamine  100 mg Oral Daily   Or  . thiamine  100 mg Intravenous Daily   Continuous Infusions:      LOS: 0 days    Time spent: 30 minutes with > 50% spent at bedside and in coordination of care.    Pennie Banter, DO [facility]  02/18/2021, 3:25 PM      If 7PM-7AM, please contact night-coverage. How to contact the [facility] Attending or Consulting provider 7A - 7P or covering provider during after hours 7P -7A, for this patient?    1. Check the care team in [facility] and look for a) attending/consulting [facility] provider listed and b) the [facility] team listed 2. Log into [email or web address] and use [facility] universal password to access. If you do not have the password, please  contact the hospital operator. 3. Locate the [facility] provider you are looking for under [facility] and page to a number that you can be directly reached. 4. If you still have difficulty reaching the provider, please page the [facility] (Director on Call) for the Hospitalists listed on amion for assistance.

## 2021-02-18 NOTE — Progress Notes (Signed)
PHARMACY - PHYSICIAN COMMUNICATION CRITICAL VALUE ALERT - BLOOD CULTURE IDENTIFICATION (BCID)  John [name] is an 60 y.o. male who presented to [facility] on [facility]/16/2021 with a chief complaint of abnormal blood work.  Advised to come to ED for Hg 8.0 on pre-op labs for upcoming kyphoplasty procedure.   Assessment:  He was admitted for symptomatic, acute anemia.  No s/sx of infection noted.  1 bottle each of 2 different sets of blood cx + GPCC (BCID + MRSE). Possible contaminant although will monitor cx data and clinical course closely with low threshold to recommend start abx if develops symptoms.   Name of physician (or Provider) Contacted: Cherylin Mylar  Current antibiotics: none  Changes to prescribed antibiotics recommended:  Monitor closely off antibiotics.     Results for orders placed or performed during the hospital encounter of 02/16/21  Blood Culture ID Panel (Reflexed) (Collected: 02/16/2021  7:05 PM)  Result Value Ref Range   Enterococcus faecalis NOT DETECTED NOT DETECTED   Enterococcus Faecium NOT DETECTED NOT DETECTED   Listeria monocytogenes NOT DETECTED NOT DETECTED   Staphylococcus species DETECTED (A) NOT DETECTED   Staphylococcus aureus (BCID) NOT DETECTED NOT DETECTED   Staphylococcus epidermidis DETECTED (A) NOT DETECTED   Staphylococcus lugdunensis NOT DETECTED NOT DETECTED   Streptococcus species NOT DETECTED NOT DETECTED   Streptococcus agalactiae NOT DETECTED NOT DETECTED   Streptococcus pneumoniae NOT DETECTED NOT DETECTED   Streptococcus pyogenes NOT DETECTED NOT DETECTED   A.calcoaceticus-baumannii NOT DETECTED NOT DETECTED   Bacteroides fragilis NOT DETECTED NOT DETECTED   Enterobacterales NOT DETECTED NOT DETECTED   Enterobacter cloacae complex NOT DETECTED NOT DETECTED   Escherichia coli NOT DETECTED NOT DETECTED   Klebsiella aerogenes NOT DETECTED NOT DETECTED   Klebsiella oxytoca NOT DETECTED NOT DETECTED   Klebsiella pneumoniae NOT DETECTED NOT  DETECTED   Proteus species NOT DETECTED NOT DETECTED   Salmonella species NOT DETECTED NOT DETECTED   Serratia marcescens NOT DETECTED NOT DETECTED   Haemophilus influenzae NOT DETECTED NOT DETECTED   Neisseria meningitidis NOT DETECTED NOT DETECTED   Pseudomonas aeruginosa NOT DETECTED NOT DETECTED   Stenotrophomonas maltophilia NOT DETECTED NOT DETECTED   Candida albicans NOT DETECTED NOT DETECTED   Candida auris NOT DETECTED NOT DETECTED   Candida glabrata NOT DETECTED NOT DETECTED   Candida krusei NOT DETECTED NOT DETECTED   Candida parapsilosis NOT DETECTED NOT DETECTED   Candida tropicalis NOT DETECTED NOT DETECTED   Cryptococcus neoformans/gattii NOT DETECTED NOT DETECTED   Methicillin resistance mecA/C DETECTED (A) NOT DETECTED    Junita Push PharmD 02/18/2021  12:48 AM

## 2021-02-19 LAB — TYPE AND SCREEN
ABO/RH(D): O POS
Antibody Screen: NEGATIVE
Unit division: 0

## 2021-02-19 LAB — RETICULOCYTES
Immature Retic Fract: 45.6 % — ABNORMAL HIGH (ref 2.3–15.9)
RBC.: 2.7 MIL/uL — ABNORMAL LOW (ref 4.22–5.81)
Retic Count, Absolute: 126.1 10*3/uL (ref 19.0–186.0)
Retic Ct Pct: 4.7 % — ABNORMAL HIGH (ref 0.4–3.1)

## 2021-02-19 LAB — CBC
HCT: 29.1 % — ABNORMAL LOW (ref 39.0–52.0)
Hemoglobin: 8.7 g/dL — ABNORMAL LOW (ref 13.0–17.0)
MCH: 32.1 pg (ref 26.0–34.0)
MCHC: 29.9 g/dL — ABNORMAL LOW (ref 30.0–36.0)
MCV: 107.4 fL — ABNORMAL HIGH (ref 80.0–100.0)
Platelets: 340 10*3/uL (ref 150–400)
RBC: 2.71 MIL/uL — ABNORMAL LOW (ref 4.22–5.81)
RDW: 16.8 % — ABNORMAL HIGH (ref 11.5–15.5)
WBC: 8.4 10*3/uL (ref 4.0–10.5)
nRBC: 0.2 % (ref 0.0–0.2)

## 2021-02-19 LAB — BASIC METABOLIC PANEL
Anion gap: 5 (ref 5–15)
BUN: 9 mg/dL (ref 6–20)
CO2: 30 mmol/L (ref 22–32)
Calcium: 9 mg/dL (ref 8.9–10.3)
Chloride: 99 mmol/L (ref 98–111)
Creatinine, Ser: 0.83 mg/dL (ref 0.61–1.24)
GFR, Estimated: >60 mL/min (ref >60)
Glucose, Bld: 116 mg/dL — ABNORMAL HIGH (ref 70–99)
Potassium: 4.7 mmol/L (ref 3.5–5.1)
Sodium: 134 mmol/L — ABNORMAL LOW (ref 135–145)

## 2021-02-19 LAB — HEMOGLOBIN AND HEMATOCRIT, BLOOD
HCT: 29.1 % — ABNORMAL LOW (ref 39.0–52.0)
Hemoglobin: 8.8 g/dL — ABNORMAL LOW (ref 13.0–17.0)

## 2021-02-19 LAB — BPAM RBC
Blood Product Expiration Date: 202206302359
ISSUE DATE / TIME: 202206051138
Unit Type and Rh: 5100

## 2021-02-19 MED ORDER — FERROUS SULFATE 325 (65 FE) MG PO TABS
325.0000 mg | ORAL_TABLET | Freq: Every day | ORAL | Status: DC
  Administered 2021-02-19: 325 mg via ORAL
  Filled 2021-02-19: qty 1

## 2021-02-19 MED ORDER — SODIUM CHLORIDE 0.9 % IV SOLN
250.0000 mg | Freq: Once | INTRAVENOUS | Status: AC
  Administered 2021-02-19: 250 mg via INTRAVENOUS
  Filled 2021-02-19: qty 20

## 2021-02-19 MED ORDER — TORSEMIDE 20 MG PO TABS
40.0000 mg | ORAL_TABLET | Freq: Every day | ORAL | Status: DC
  Administered 2021-02-19 – 2021-02-20 (×2): 40 mg via ORAL
  Filled 2021-02-19 (×2): qty 2

## 2021-02-19 NOTE — Progress Notes (Signed)
PROGRESS NOTE    John [name]   ZOX:[id number]  DOB: Nov 14, 1960  PCP: Shelva MajesticHunter, Stephen O, MD    DOA: 02/16/2021 LOS: 1   Brief Narrative   John [name] is a 60 y.o. male with medical history significant for combined systolic and diastolic heart failure (EF 20-25%) s/p ICD, avascular necrosis of the left hip s/p replacement, anxiety, depression and alcohol abuse who presents as a transfer from outside ED for concerns of hypotension and acute anemia that was identified in pre-op labs for upcoming kyphoplasty.  Hbg was 8.0, down from 14 last month, therefore patient was referred to the ED for evaluation.      Assessment & Plan   Active Problems:   Paroxysmal atrial fibrillation (HCC)   Chronic combined systolic and diastolic CHF, NYHA class 3 (HCC)   S/P ICD (internal cardiac defibrillator) procedure   Hyponatremia   Acute anemia   Hypotension   ETOH abuse   Acute macrocytic anemia / Iron deficiency -  Hbg a month ago was 14, found to 8.0 on pre-op labs. Unlikely to be GI blood loss by history and negative fecal occult.   Unlikely hemolysis with normal LDH.   Anemia panel consistent with iron deficiency, normal folate and B12.  Macrocytsis due to heavy alcohol use. Hbg trend: 8.1 >> 7.4 >>7.8 >>7.5 6/5 given his cardiac history and upcoming procedures, transfuse 1 unit --Trend Hbg --Goal of Hbg around ~9.0 --Diet okay for now --Make NPO if any sign of GI bleed --Iron infusion on 6/4 --Repeat iron infusion today --Discharge on daily PO iron supplement  Hypotension - resolved.  Present on admission thought due to hypovolemia.  Given gentle IV fluids (low EF ~20%).  Antihypertensives held.   --maintain MAP>65 --Demadex resumed 6/6 - monitor BP closely  EtOH Abuse - excessive alcohol use recently, pt says helps pain to be tolerable.  Last drink prior to ED day of admission. 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 Pt endorses drinking a lot of beer and little food. --Given gentle fluids - stopped --Encourage PO hydration --Follow BMP --Counseled patient on cause of this  Right Rib Fractures involving anterolateral ribs 6-9 - appears no complications. --lidocaine patch --PO pain meds per orders PRN  Acute T12 Compression Fracture - with plan for kyphoplasty on 02/22/21 with Dr. Debria Garretahari.   --Check coags with AM labs --Hold Eliquis --Pain control  Right Hip Pain - s/p fall back in March.  Scheduled for Right hip THA with Dr. Veda CanningSwintek, but wanted to kypho done first.   Pt with Hx of prior Left THA. --PT --Pain control --Spoke with Dr. Veda CanningSwintek 6/6: goal of Hbg above 10 for surgery  Paroxysmal a-fib - rate controlled.   --Hold Eliquis due to anemia and for upcoming kyphoplasty  Chronic Combined Systolic/Diastolic CHF - appears overall euvolemic, compensated on admission. --Meds held on admission due to low BP: Entresto, Coreg, Aldactone, Demadex --resume Demadex (6/6), follow BP and renal function --Monitor volume status - daily weights  Anxiety/Depression - continue SSRI  Obesity: Body mass index is 38.13 kg/m.  Complicates overall care and prognosis.  Recommend lifestyle modifications including physical activity and diet for weight loss and overall long-term health.   DVT prophylaxis: SCDs Start: 02/16/21 2256   Diet:  Diet Orders (From admission, onward)    Start     Ordered   02/17/21 1259  Diet Heart Room service appropriate? Yes; Fluid consistency: Thin  Diet effective now       Question Answer Comment  Room service appropriate? Yes   Fluid consistency: Thin      02/17/21 1258            Code Status: Full Code    Subjective 02/19/21    Pt was sleeping but woke easily.  Reports some rib pain, controlled with medication.  Having BM's no red blood, maroon or dark/black/tarry stools.  No other acute complaints.   Disposition Plan & Communication   Status is:  Inpatient  Inpatient appropriate due to severity of illness, acute severe anemia requiring transfusion. Ongoing evaluation.  Anticipate d/c home tomorrow 6/8 if Hbg stable  Dispo: The patient is from: home              Anticipated d/c is to: home              Patient currently not medically stable for d/c   Difficult to place patient no    Consults, Procedures, Significant Events   Consultants:   None  Procedures:   None  Antimicrobials:  Anti-infectives (From admission, onward)   None        Micro    Objective   Vitals:   02/18/21 1500 02/18/21 1512 02/18/21 2040 02/19/21 1336  BP: 119/69 (!) 114/50 (!) 111/91 128/81  Pulse: 74 69 73 69  Resp: (!) 21 18 18 16   Temp: 97.7 F (36.5 C) (!) 97.4 F (36.3 C) 98.9 F (37.2 C) 98 F (36.7 C)  TempSrc: Oral Oral Oral Oral  SpO2: 99% 98% 97% 99%  Weight:      Height:        Intake/Output Summary (Last 24 hours) at 02/19/2021 1533 Last data filed at 02/19/2021 0958 Gross per 24 hour  Intake 480 ml  Output 500 ml  Net -20 ml   Filed Weights   02/16/21 1646  Weight: 124 kg    Physical Exam:  General exam: awake, alert, no acute distress, obese Respiratory system: normal respiratory effort, on room air. Cardiovascular system: RRR, no pedal edema.   Central nervous system: A&O x4. no gross focal neurologic deficits, normal speech Extremities: moves all, normal tone, no peripheral edema   Labs   Data Reviewed: I have personally reviewed following labs and imaging studies  CBC: Recent Labs  Lab 02/15/21 1400 02/16/21 1648 02/17/21 0011 02/17/21 1141 02/18/21 0552 02/18/21 1651 02/19/21 0537 02/19/21 1320  WBC 12.4* 10.3 8.4  --  8.5  --  8.4  --   NEUTROABS  --  7.0  --   --   --   --   --   --   HGB 8.0* 8.1* 7.4* 7.8* 7.5* 8.8* 8.7* 8.8*  HCT 25.5* 25.0* 23.3* 24.5* 25.4* 29.0* 29.1* 29.1*  MCV 104.1* 101.6* 102.6*  --  109.0*  --  107.4*  --   PLT 514* 479* 378  --  337  --  340  --    Basic  Metabolic Panel: Recent Labs  Lab 02/15/21 1400 02/16/21 1648 02/17/21 0011 02/17/21 1024 02/18/21 0552 02/19/21 0537  NA 132* 126* 127*  --  134* 134*  K 3.2* 3.2* 3.1*  --  4.4 4.7  CL 93* 87* 92*  --  100 99  CO2 26 25 25   --  28 30  GLUCOSE 118* 124* 114*  --  112* 116*  BUN 7 11 13   --  9 9  CREATININE 1.01 1.04 0.86  --  0.82 0.83  CALCIUM 8.5* 8.3* 8.2*  --  8.9 9.0  MG  --   --   --  1.8 2.1  --    GFR: Estimated Creatinine Clearance: 128.5 mL/min (by C-G formula based on SCr of 0.83 mg/dL). Liver Function Tests: Recent Labs  Lab 02/16/21 1648  AST 16  ALT 14  ALKPHOS 57  BILITOT 0.3  PROT 6.2*  ALBUMIN 3.7   Recent Labs  Lab 02/16/21 1648  LIPASE 29   No results for input(s): AMMONIA in the last 168 hours. Coagulation Profile: Recent Labs  Lab 02/16/21 1648  INR 1.1   Cardiac Enzymes: No results for input(s): CKTOTAL, CKMB, CKMBINDEX, TROPONINI in the last 168 hours. BNP (last 3 results) No results for input(s): PROBNP in the last 8760 hours. HbA1C: No results for input(s): HGBA1C in the last 72 hours. CBG: No results for input(s): GLUCAP in the last 168 hours. Lipid Profile: No results for input(s): CHOL, HDL, LDLCALC, TRIG, CHOLHDL, LDLDIRECT in the last 72 hours. Thyroid Function Tests: No results for input(s): TSH, T4TOTAL, FREET4, T3FREE, THYROIDAB in the last 72 hours. Anemia Panel: Recent Labs    02/17/21 0011 02/19/21 0537  VITAMINB12 225  --   FOLATE 12.5  --   TIBC 392  --   IRON 27*  --   RETICCTPCT  --  4.7*   Sepsis Labs: Recent Labs  Lab 02/16/21 1747 02/16/21 1910 02/17/21 0011 02/17/21 0248  LATICACIDVEN 2.4* 2.0* 1.6 1.0    Recent Results (from the past 240 hour(s))  Surgical pcr screen     Status: None   Collection Time: 02/15/21  1:26 PM   Specimen: Nasal Mucosa; Nasal Swab  Result Value Ref Range Status   MRSA, PCR NEGATIVE NEGATIVE Final   Staphylococcus aureus NEGATIVE NEGATIVE Final    Comment:  (NOTE) The Xpert SA Assay (FDA approved for NASAL specimens in patients 18 years of age and older), is one component of a comprehensive surveillance program. It is not intended to diagnose infection nor to guide or monitor treatment. Performed at [facility], [address] [place], [place] [address]   Blood culture (routine x 2)     Status: Abnormal (Preliminary result)   Collection Time: 02/16/21  7:05 PM   Specimen: BLOOD  Result Value Ref Range Status   Specimen Description   Final    BLOOD LEFT ANTECUBITAL Performed at [facility], [address] [place], [place] [address]    Special Requests   Final    BOTTLES DRAWN AEROBIC AND ANAEROBIC Blood Culture adequate volume Performed at [facility], [address] [place], [place] [address]    Culture  Setup Time   Final    GRAM POSITIVE COCCI ANAEROBIC BOTTLE ONLY CRITICAL RESULT CALLED TO, READ BACK BY AND VERIFIED WITH: PHARMD MICHELLE LILLISTAN 02/18/2021 AT 0028 A.HUGHES    Culture (A)  Final    STAPHYLOCOCCUS EPIDERMIDIS SUSCEPTIBILITIES TO FOLLOW Performed at [facility], [address] [place], [place] [address]    Report Status PENDING  Incomplete  SARS CORONAVIRUS 2 (TAT 6-24 HRS) Nasopharyngeal Peripheral     Status: None   Collection Time: 02/16/21  7:05 PM   Specimen: Peripheral; Nasopharyngeal  Result Value Ref Range Status   SARS Coronavirus 2 NEGATIVE NEGATIVE Final    Comment: (NOTE) SARS-CoV-2 target nucleic acids are NOT DETECTED.  The SARS-CoV-2 RNA is generally detectable in upper and lower respiratory specimens during the acute  phase of infection. Negative results do not preclude SARS-CoV-2 infection, do not rule out co-infections with other pathogens, and should not be used as the sole basis for treatment or other patient management decisions. Negative results must be combined with clinical observations, patient history, and epidemiological  information. The expected result is Negative.  Fact Sheet for Patients: HairSlick.no  Fact Sheet for Healthcare Providers: quierodirigir.com  This test is not yet approved or cleared by the [place] FDA and  has been authorized for detection and/or diagnosis of SARS-CoV-2 by FDA under an Emergency Use Authorization (EUA). This EUA will remain  in effect (meaning this test can be used) for the duration of the COVID-19 declaration under Se ction 564(b)(1) of the Act, 21 U.S.C. section 360bbb-3(b)(1), unless the authorization is terminated or revoked sooner.  Performed at [facility], [address] [place], [place] [address]   Blood Culture ID Panel (Reflexed)     Status: Abnormal   Collection Time: 02/16/21  7:05 PM  Result Value Ref Range Status   Enterococcus faecalis NOT DETECTED NOT DETECTED Final   Enterococcus Faecium NOT DETECTED NOT DETECTED Final   Listeria monocytogenes NOT DETECTED NOT DETECTED Final   Staphylococcus species DETECTED (A) NOT DETECTED Final    Comment: CRITICAL RESULT CALLED TO, READ BACK BY AND VERIFIED WITH: PHARMD MICHELLE LILLISTAN 02/18/2021 AT 0028 A.HUGHES    Staphylococcus aureus (BCID) NOT DETECTED NOT DETECTED Final   Staphylococcus epidermidis DETECTED (A) NOT DETECTED Final    Comment: Methicillin (oxacillin) resistant coagulase negative staphylococcus. Possible blood culture contaminant (unless isolated from more than one blood culture draw or clinical case suggests pathogenicity). No antibiotic treatment is indicated for blood  culture contaminants. CRITICAL RESULT CALLED TO, READ BACK BY AND VERIFIED WITH: PHARMD MICHELLE LILLISTAN 02/18/2021 AT 0028 A.HUGHES    Staphylococcus lugdunensis NOT DETECTED NOT DETECTED Final   Streptococcus species NOT DETECTED NOT DETECTED Final   Streptococcus agalactiae NOT DETECTED NOT DETECTED Final   Streptococcus pneumoniae NOT DETECTED  NOT DETECTED Final   Streptococcus pyogenes NOT DETECTED NOT DETECTED Final   A.calcoaceticus-baumannii NOT DETECTED NOT DETECTED Final   Bacteroides fragilis NOT DETECTED NOT DETECTED Final   Enterobacterales NOT DETECTED NOT DETECTED Final   Enterobacter cloacae complex NOT DETECTED NOT DETECTED Final   Escherichia coli NOT DETECTED NOT DETECTED Final   Klebsiella aerogenes NOT DETECTED NOT DETECTED Final   Klebsiella oxytoca NOT DETECTED NOT DETECTED Final   Klebsiella pneumoniae NOT DETECTED NOT DETECTED Final   Proteus species NOT DETECTED NOT DETECTED Final   Salmonella species NOT DETECTED NOT DETECTED Final   Serratia marcescens NOT DETECTED NOT DETECTED Final   Haemophilus influenzae NOT DETECTED NOT DETECTED Final   Neisseria meningitidis NOT DETECTED NOT DETECTED Final   Pseudomonas aeruginosa NOT DETECTED NOT DETECTED Final   Stenotrophomonas maltophilia NOT DETECTED NOT DETECTED Final   Candida albicans NOT DETECTED NOT DETECTED Final   Candida auris NOT DETECTED NOT DETECTED Final   Candida glabrata NOT DETECTED NOT DETECTED Final   Candida krusei NOT DETECTED NOT DETECTED Final   Candida parapsilosis NOT DETECTED NOT DETECTED Final   Candida tropicalis NOT DETECTED NOT DETECTED Final   Cryptococcus neoformans/gattii NOT DETECTED NOT DETECTED Final   Methicillin resistance mecA/C DETECTED (A) NOT DETECTED Final    Comment: CRITICAL RESULT CALLED TO, READ BACK BY AND VERIFIED WITH: PHARMD MICHELLE LILLISTAN 03/10/2021 AT 0028 A.HUGHES Performed at [facility], [address] [place], [place] [address]  Blood culture (routine x 2)     Status: Abnormal (Preliminary result)   Collection Time: 02/16/21  7:10 PM   Specimen: BLOOD  Result Value Ref Range Status   Specimen Description   Final    BLOOD RIGHT HAND Performed at [facility], [address], [place], [place] [address]    Special Requests   Final    BOTTLES DRAWN AEROBIC AND  ANAEROBIC Blood Culture adequate volume Performed at [facility], [address] [place], [place] [address]    Culture  Setup Time   Final    GRAM POSITIVE COCCI IN BOTH AEROBIC AND ANAEROBIC BOTTLES CRITICAL RESULT CALLED TO, READ BACK BY AND VERIFIED WITH: PHARMD MICHELLE LILLISTAN 02/18/2021 AT 0028 A.HUGHES Performed at [facility], [address], [place], [place] [address]    Culture STAPHYLOCOCCUS EPIDERMIDIS (A)  Final   Report Status PENDING  Incomplete      Imaging Studies   No results found.   Medications   Scheduled Meds: . escitalopram  10 mg Oral Daily  . folic acid  1 mg Oral Daily  . lidocaine  1 patch Transdermal Q24H  . multivitamin with minerals  1 tablet Oral Daily  . rosuvastatin  20 mg Oral Daily  . thiamine  100 mg Oral Daily   Or  . thiamine  100 mg Intravenous Daily   Continuous Infusions: . ferric gluconate (FERRLECIT) IVPB         LOS: 1 day    Time spent: 30 minutes with > 50% spent at bedside and in coordination of care.    Pennie Banter, DO [facility]  02/19/2021, 3:33 PM      If 7PM-7AM, please contact night-coverage. How to contact the [facility] Attending or Consulting provider 7A - 7P or covering provider during after hours 7P -7A, for this patient?    1. Check the care team in [facility] and look for a) attending/consulting [facility] provider listed and b) the [facility] team listed 2. Log into [email or web address] and use [place]'s universal password to access. If you do not have the password, please contact the hospital operator. 3. Locate the [facility] provider you are looking for under [facility] and page to a number that you can be directly reached. 4. If you still have difficulty reaching the provider, please page the [facility] (Director on Call) for the Hospitalists listed on amion for assistance.

## 2021-02-19 NOTE — Progress Notes (Addendum)
Anesthesia Chart Review:  Case: [id number] Date/Time: 02/22/21 0715   Procedure: KYPHOPLASTY T12 (N/A ) - 90 mins   Anesthesia type: General   Pre-op diagnosis: T12 compression fracture   [facility] ROOM 04 / [facility]   Surgeons: Venita Lick, MD      DISCUSSION: Patient is a 60 year old male scheduled for the above procedure.  History includes never smoker, HTN, OSA (reported intermittent CPAP use), non-ischemic cardiomyopathy (diagnosed 10/2016), chronic combined systolic and diastolic CHF, ICD (s/p single chamber St. Jude ICD 05/06/18), afib (diagnosed 08/2019, s/p DCCV 09/01/19 ), DM2, ETOH abuse, GI bleed, shoulder surgery (right total shoulder 11/24/15; reverse left total shoulder 10/19/19), left THA (03/27/15). BMI is consistent with obesity.    Last HF cardiology evaluation 02/13/21 by Tonye Becket, NP.She notes need to [facility] but will need back surgery first. "Ok to come off elquis for 3 days before surgery and 2 days after. Discussed with Dr Shirlee Latch."   Per Perioperative Cardiac Device RX: Device Information: Clinic EP Physician:Croitoru, Mihai Device Type:Defibrillator Manufacturer and Phone #:St. Jude/Abbott: [phone] Pacemaker Dependent?:No Date of Last Device Check:Overdue for follow-upNormal Device Function?[place] Score for follow-up since January, 2021  Electrophysiologist's Recommendations:  Patient overdue for follow-up.  Industry must check device prior to procedure.  Patient's preoperative labs from 02/16/21 showed new anemia (HGB 8.0, down from 14.1 on 08/24/20). He was also hypotensive with BP 92/52. HR 81 bpm. He was referred to the ED. He also reported a fall about three days prior when getting out of his car and developed bruising beneath his eyes. Head CT negative. Chest CT showed right rib fractures without PTX. He reported some dizziness and worsening SOB as well. He admitted to drinking more this past month due to severe back and hip pain and  reported drinking up to 6 beers/night (although ED documentation noted 8-12). He was admitted to [facility] on 02/16/21 for acute anemia (transfusion threshold for HGB < 7), hypotension due to hypovolemia, hyponatremia likely for ETOH use. FOBT negative. CIWA protocol due to ETOH abuse history. HF medications placed on hold initially due to hypotension. Eliquis on held as well.   As of 02/19/21 9:33 AM, anemia felt unlikely due to GI blood loss by history and negative fecal occult, and unlikely hemolysis with normal LDH. Anemia panel felt consistent with iron deficiency, normal folate and B12. Macrocytosis due to heavy alcohol use. Given cardiac history and upcoming procedures, plan to transfuse 1 unit PRBC. S/p iron infusion 02/17/21 & 02/19/21. Na up to 134 and H/H 8.8/29.1.   Will leave chart for follow-up since he is still admitted at [facility] (surgery is scheduled for [facility]). 02/16/21 COVID-19 test negative.    ADDENDUM 02/21/21 10:51 AM:  Patient discharged from [facility] on 02/20/21. Hyponatremia resolved with Na 137 on 02/20/21, Cr 1.09. H/H improved to 9.1/30.3 following 1 unit PRBC and 2 doses of IV ferric gluconate. He was discharged on oral iron. His hypotension improved with holding of Coreg and Entresto, and appears these were held at discharge as well until he is re-evaluated. He was continued on diuretic therapy. Eliquis was held given his plans for kyphoplasty on 02/22/21. He was counseled on alcohol use/cessation and has Ativan as needed. He does not plan to quit, but his goal is to decrease his alcohol intake which he had done in the past, but given more recent pain from back and hip, he was self medicating and hopes pain will improve after his upcoming surgeries. Of note, he had 1 bottle  each of 2 different sets of blood cultures that were positive for GPCC (BCID + MRSE), possible contaminant. No antibiotics recommended unless concern for infection. I spoke with Voncille Lo, PA-C with Dr. Shon Baton. She is aware of  hospitalization events, and reports Dr. Shon Baton actually called and spoke with the Hospitalist on 02/20/21 and was told that at that time they felt patient was stable to proceed with kyphoplasty as planned on 02/22/21. Anesthesia team to evaluate on the day of surgery.     VS: BP (!) 92/52   Pulse 81   Temp 37.1 C (Oral)   Resp 18   Ht 5\' 11"  (1.803 m)   Wt 124.1 kg   SpO2 97%   BMI 38.17 kg/m    BP Readings from Last 3 Encounters:  02/20/21 (!) 148/79  02/15/21 (!) 92/52  02/13/21 (!) 146/72   Pulse Readings from Last 3 Encounters:  02/20/21 94  02/15/21 81  02/13/21 91    PROVIDERS: Shelva Majestic, MD is PCP Marca Ancona, MD is HF cardiologist Nanetta Batty, MD is primary cardiologist. ICD was placed by Croitoru, Rachelle Hora, MI on 05/06/18.    LABS: Currently, most recent lab results include: Lab Results  Component Value Date   WBC 8.4 02/19/2021   HGB 9.1 (L) 02/20/2021   HCT 30.3 (L) 02/20/2021   PLT 340 02/19/2021   GLUCOSE 118 (H) 02/20/2021   ALT 14 02/16/2021   AST 16 02/16/2021   NA 137 02/20/2021   K 4.1 02/20/2021   CL 96 (L) 02/20/2021   CREATININE 1.09 02/20/2021   BUN 8 02/20/2021   CO2 33 (H) 02/20/2021   INR 1.0 02/20/2021   HGBA1C 5.5 04/21/2020     IMAGES: CT Head (post-fall) 02/16/21: IMPRESSION: No acute intracranial abnormality.  CT Chest/abd/pelvis (post-fall) 02/16/21: IMPRESSION: - Acute fractures through the right anterolateral 6th through 9th ribs. - Multiple old bilateral rib fractures. - Minimal right base and dependent atelectasis. - No pneumothorax. - Coronary artery disease, aortic atherosclerosis. - No acute findings in the abdomen or pelvis.  CXR 02/15/21: FINDINGS: Bilateral shoulder replacements. Left-sided single lead pacing device as before. Linear scarring or atelectasis at the bases. No acute airspace disease, pleural effusion, or pneumothorax. Normal cardiomediastinal silhouette. Degenerative changes of the  spine IMPRESSION: No active cardiopulmonary disease. Minimal scarring or atelectasis at the bases  MRI L-spine 01/18/21: IMPRESSION: 1. Acute/subacute compression fracture of the T12 vertebral body resulting in approximately 30% loss of vertebral body height. No retropulsion. This is a new finding since prior MRI. 2. Stable mild degenerative changes of the lumbar spine without high-grade spinal canal or neural foraminal stenosis at any level.   EKG: 02/16/21:  Sinus rhythm Incomplete left bundle branch block Low voltage, precordial leads Baseline wander in lead(s) V4 No significant change since last tracing Confirmed by Ross Marcus ([id number]) on 02/17/2021 2:42:11 PM   CV: Echo 01/16/21: IMPRESSIONS  1. Left ventricular ejection fraction, by estimation, is 25 to 30%. The  left ventricle has severely decreased function. The left ventricle  demonstrates global hypokinesis. The left ventricular internal cavity size  was mildly dilated. Left ventricular  diastolic parameters are consistent with Grade I diastolic dysfunction  (impaired relaxation).  2. Right ventricular systolic function is normal. The right ventricular  size is normal. Tricuspid regurgitation signal is inadequate for assessing  PA pressure.  3. Left atrial size was mildly dilated.  4. The mitral valve is normal in structure. No evidence of mitral valve  regurgitation. No  evidence of mitral stenosis.  5. The aortic valve is tricuspid. Aortic valve regurgitation is not  visualized. No aortic stenosis is present.  6. Technically difficult study with poor acoustic windows.  - Comparison 09/01/19: LVEF 25-30%, LV global hypokinesis, mildly reduced RV systolic function   CPX 04/01/18: Conclusion: The interpretation of this test is limited due to submaximal effort during the exercise. Based on available data, exercise testing with gas exchange demonstrates a moderate to severe functional impairment when compared to  matched sedentary norms. Pre-exercise spirometry demonstrates a mild restrictive pattern that is likely due to body habitus, contributing to exercise intolerance. Overall, patient's primarily moderately HF with elevated VE/VCO2 slope predicting poor short-term prognosis, particularly with blunted BP response. There was also chronotropic incompetence, most likely in the setting of submaximal exercise. (Preliminary impression by: Lesia Hausen, MS, ACSM-RCEP) - Final Impression: Moderate functional limitation due to HF, obesity and restrictive lung physiology. The blunted BP response raises the question of a more severe HF limitation. Consider repeat testing in 6 months if symptoms not improving. Arvilla Meres, MD)   RHC/LHC 10/21/16:  There is severe left ventricular systolic dysfunction.  LV end diastolic pressure is severely elevated.  The left ventricular ejection fraction is less than 25% by visual estimate.  IMPRESSION: Mr. Langland has normal renal arteries and severe LV dysfunction with elevated filling pressures. He has a nonischemic cardiomyopathy. Aggressive medical therapy will be recommended.  By cardiology notes, "LHC in 2/18 showed normal coronaries."    Past Medical History:  Diagnosis Date  . AICD (automatic cardioverter/defibrillator) present    St. Jude  . Anxiety    Ativan  . Chronic combined systolic and diastolic CHF, NYHA class 3 (HCC) 10/2016   Nonischemic cardiomyopathy. EF 20-25%.  . Chronic left hip pain   . Depression    history of  . Dysrhythmia    A-FIB  . GI bleed 03/2016  . Headache   . Hypertensive heart disease with combined systolic and diastolic congestive heart failure (HCC) 10/2016  . Nonischemic cardiomyopathy (HCC) 10/2016   Echo with EF 20-25%. Cardiac catheterization with no CAD. LVEDP was 41 mmHg, PCWP 36 mmHg  . Osteoarthritis    right shoulder  . Right hand fracture   . Seasonal allergies   . Sleep apnea    wears a CPAP  . Wears  glasses     Past Surgical History:  Procedure Laterality Date  . CARDIAC CATHETERIZATION    . CARDIOVERSION N/A 09/01/2019   Procedure: CARDIOVERSION;  Surgeon: Laurey Morale, MD;  Location: [facility] ENDOSCOPY;  Service: Cardiovascular;  Laterality: N/A;  . ESOPHAGOGASTRODUODENOSCOPY (EGD) WITH PROPOFOL N/A 03/20/2016   Procedure: ESOPHAGOGASTRODUODENOSCOPY (EGD) WITH PROPOFOL;  Surgeon: Charlott Rakes, MD;  Location: [facility] ENDOSCOPY;  Service: Endoscopy;  Laterality: N/A;  . ICD IMPLANT N/A 05/06/2018   Procedure: ICD IMPLANT;  Surgeon: Thurmon Fair, MD;  Location: [facility] INVASIVE CV LAB;  Service: Cardiovascular;  Laterality: N/A;  . IRRIGATION AND DEBRIDEMENT SHOULDER Left 11/11/2019   Procedure: LEFT SHOULDER IRRIGATION AND DEBRIDEMENT WITH POLY EXCHANGE;  Surgeon: Jones Broom, MD;  Location: [facility] ORS;  Service: Orthopedics;  Laterality: Left;  . RIGHT/LEFT HEART CATH AND CORONARY ANGIOGRAPHY N/A 10/21/2016   Procedure: Right/Left Heart Cath and Coronary Angiography;  Surgeon: Runell Gess, MD;  Location: [facility] INVASIVE CV LAB;  Service: Cardiovascular: Angiographically normal coronary arteries. PCWP 33-36 mmHg, LVEDP 41 mmHg. PA pressure 60/35, mean 46 mmHg.  Cardiac output/cardiac index-3.93 /1.76 (Fick), 3.49/1.57 (thermodilution)  . TEE WITHOUT  CARDIOVERSION N/A 09/01/2019   Procedure: TRANSESOPHAGEAL ECHOCARDIOGRAM (TEE);  Surgeon: Laurey Morale, MD;  Location: [facility] ENDOSCOPY;  Service: Cardiovascular;  Laterality: N/A;  . TOTAL HIP ARTHROPLASTY Left 03/27/2015   Procedure: LEFT TOTAL HIP ARTHROPLASTY ANTERIOR APPROACH;  Surgeon: Samson Frederic, MD;  Location: [facility] OR;  Service: Orthopedics;  Laterality: Left;  . TOTAL SHOULDER ARTHROPLASTY Right 11/24/2015   Procedure: RIGHT TOTAL SHOULDER ARTHROPLASTY;  Surgeon: Beverely Low, MD;  Location[facility] OR;  Service: Orthopedics;  Laterality: Right;  . TOTAL SHOULDER ARTHROPLASTY Left 10/19/2019   Procedure: REVERSE TOTAL SHOULDER ARTHROPLASTY;  Surgeon:  Jones Broom, MD;  Location: [facility] ORS;  Service: Orthopedics;  Laterality: Left;  . TRANSTHORACIC ECHOCARDIOGRAM  10/2016   EF 20-25%. Diffuse hypokinesis but akinesis of the entire inferoseptal wall and apical wall. Moderate biatrial enlargement. PA pressure estimated 64 mmHg.  . WISDOM TOOTH EXTRACTION      MEDICATIONS: No current facility-administered medications for this encounter.   No current outpatient medications on file.   Marland Kitchen acetaminophen (TYLENOL) tablet 650 mg  . escitalopram (LEXAPRO) tablet 10 mg  . ferric gluconate (FERRLECIT) 250 mg in sodium chloride 0.9 % 250 mL IVPB  . folic acid (FOLVITE) tablet 1 mg  . lidocaine (LIDODERM) 5 % 1 patch  . LORazepam (ATIVAN) tablet 1-4 mg   Or  . LORazepam (ATIVAN) injection 1-4 mg  . multivitamin with minerals tablet 1 tablet  . oxyCODONE-acetaminophen (PERCOCET/ROXICET) 5-325 MG per tablet 1 tablet  . rosuvastatin (CRESTOR) tablet 20 mg  . thiamine tablet 100 mg   Or  . thiamine (B-1) injection 100 mg  . torsemide (DEMADEX) tablet 40 mg  . traZODone (DESYREL) tablet 50 mg    Shonna Chock, PA-C Surgical Short Stay/Anesthesiology [facility] Phone [phone] [facility] Phone [phone] 02/19/2021 6:05 PM

## 2021-02-20 ENCOUNTER — Telehealth: Payer: Self-pay | Admitting: Family Medicine

## 2021-02-20 LAB — HEMOGLOBIN AND HEMATOCRIT, BLOOD
HCT: 30.3 % — ABNORMAL LOW (ref 39.0–52.0)
Hemoglobin: 9.1 g/dL — ABNORMAL LOW (ref 13.0–17.0)

## 2021-02-20 LAB — BASIC METABOLIC PANEL
Anion gap: 8 (ref 5–15)
BUN: 8 mg/dL (ref 6–20)
CO2: 33 mmol/L — ABNORMAL HIGH (ref 22–32)
Calcium: 8.8 mg/dL — ABNORMAL LOW (ref 8.9–10.3)
Chloride: 96 mmol/L — ABNORMAL LOW (ref 98–111)
Creatinine, Ser: 1.09 mg/dL (ref 0.61–1.24)
GFR, Estimated: >60 mL/min (ref >60)
Glucose, Bld: 118 mg/dL — ABNORMAL HIGH (ref 70–99)
Potassium: 4.1 mmol/L (ref 3.5–5.1)
Sodium: 137 mmol/L (ref 135–145)

## 2021-02-20 LAB — CULTURE, BLOOD (ROUTINE X 2)
Special Requests: ADEQUATE
Special Requests: ADEQUATE

## 2021-02-20 LAB — PROTIME-INR
INR: 1 (ref 0.8–1.2)
Prothrombin Time: 13.1 seconds (ref 11.4–15.2)

## 2021-02-20 LAB — APTT: aPTT: 39 seconds — ABNORMAL HIGH (ref 24–36)

## 2021-02-20 MED ORDER — OXYCODONE-ACETAMINOPHEN 5-325 MG PO TABS: 1.0000 | ORAL_TABLET | ORAL | 0 refills | Status: DC | PRN

## 2021-02-20 MED ORDER — FERROUS SULFATE 325 (65 FE) MG PO TABS: 325.0000 mg | ORAL_TABLET | Freq: Every day | ORAL | 3 refills | Status: DC

## 2021-02-20 MED ORDER — TRAZODONE HCL 50 MG PO TABS: 50.0000 mg | ORAL_TABLET | Freq: Every evening | ORAL | 0 refills | Status: DC | PRN

## 2021-02-20 MED ORDER — LIDOCAINE-PRILOCAINE 2.5-2.5 % EX CREA: 1.0000 "application " | TOPICAL_CREAM | CUTANEOUS | 0 refills | Status: DC | PRN

## 2021-02-20 NOTE — Progress Notes (Signed)
Discharge instructions given with stated understanding 

## 2021-02-20 NOTE — TOC Transition Note (Signed)
Transition of Care [facility]) - CM/SW Discharge Note   Patient Details  Name: John [name] MRN: [id number] Date of Birth: December 12, 1960  [facility]) CM/SW Contact:  Ida Rogue, LCSW Phone Number: 02/20/2021, 3:51 PM   Clinical Narrative:   Patient seen in follow up to MD consult for alcohol use/cessation.  [name] was open to talking about his use, states he drinks beer only and it is for "medicinal purposes" due to pain related to hip and back.  However, he is hopeful that he will soon be pain free as he is scheduled for Kyphoplasty on Thursday, and hopefully hip replacement in 3 weeks or so.  He admits his goal is to cut back, that stopping altogether is not in his immediate future, and he has been able to do that in the past.  He went on to say that he has two brothers who are alcoholics who attend AA, and so he feels like he has support that he can turn to.  "Who knows, I may even attend a meeting with them."  He declined any further resources.  "I know what is available out there."  No further needs identified. TOC sign off.    Final next level of care: Home/Self Care Barriers to Discharge: No Barriers Identified   Patient Goals and CMS Choice        Discharge Placement                       Discharge Plan and Services                                     Social Determinants of Health (SDOH) Interventions     Readmission Risk Interventions No flowsheet data found.

## 2021-02-20 NOTE — Discharge Summary (Signed)
Physician Discharge Summary  John MorinCharles Onofre WUJ:[id number] DOB: 1961/07/31 DOA: 02/16/2021  PCP: Shelva MajesticHunter, Stephen O, MD  Admit date: 02/16/2021 Discharge date: 03/07/2021  Admitted From home Disposition:  home  Recommendations for Outpatient Follow-up:  Follow up with PCP in 1-2 weeks Please obtain BMP/CBC in one week Please follow up with orthopedic surgery for kyphoplasty as scheduled  Home Health: none Equipment/Devices: none  Discharge Condition: stable  CODE STATUS: full  Diet recommendation: Heart Healthy       Discharge Diagnoses: Active Problems:   Paroxysmal atrial fibrillation (HCC)   Chronic combined systolic and diastolic CHF, NYHA class 3 (HCC)   S/P ICD (internal cardiac defibrillator) procedure   Hyponatremia   Acute anemia   Hypotension   ETOH abuse    Summary of HPI and Hospital Course:  John MorinCharles [name] is a 60 y.o. male with medical history significant for combined systolic and diastolic heart failure (EF 20-25%) s/p ICD, avascular necrosis of the left hip s/p replacement, anxiety, depression and alcohol abuse who presents as a transfer from outside ED for concerns of hypotension and acute anemia that was identified in pre-op labs for upcoming kyphoplasty.  Hbg was 8.0, down from 14 last month, therefore patient was referred to the ED for evaluation.      Acute macrocytic anemia / Iron deficiency - Hbg a month ago was 14, found to 8.0 on pre-op labs. Unlikely to be GI blood loss by history and negative fecal occult.   Unlikely hemolysis with normal LDH.   Anemia panel consistent with iron deficiency, normal folate and B12.  Macrocytsis due to heavy alcohol use. Hbg trend: 8.1 >> 7.4 >>7.8 >>7.5 6/5 given his cardiac history and upcoming procedures, transfuse 1 unit --Trend Hbg --Goal of Hbg around ~9.0 --Diet okay for now --Make NPO if any sign of GI bleed --Iron infusions on 6/4 and 6/6 --Discharge on daily PO iron supplement   Hypotension -  resolved.  Present on admission thought due to hypovolemia.  Pain meds also contribute. Given gentle IV fluids (low EF ~20%).  Antihypertensives held.   --maintain MAP>65 --Demadex resumed 6/6 - monitor BP closely   EtOH Abuse - excessive alcohol use recently, pt says helps pain to be tolerable.  Last drink prior to ED day of admission. --Managed by CIWA protocol with PRN Ativan - uneventful course without complications.   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 Pt endorses drinking a lot of beer and little food. --Given gentle fluids - stopped --Encourage PO hydration --Follow BMP --Counseled patient on cause of this; advice alcohol cessation   Right Rib Fractures involving anterolateral ribs 6-9 - appears no complications. --lidocaine patch --PO pain meds per orders PRN   Acute T12 Compression Fracture - with plan for kyphoplasty on 02/22/21 with Dr. Debria Garretahari.   --Check coags with AM labs --Hold Eliquis until cleared by ortho to resume --Pain control   Right Hip Pain - s/p fall back in March.  Scheduled for Right hip THA with Dr. Veda CanningSwintek, but wanted to kypho done first.   Pt with Hx of prior Left THA. --PT --Pain control --Spoke with Dr. Veda CanningSwintek 6/6: goal of Hbg above 10 for surgery   Paroxysmal a-fib - rate controlled.   --Hold Eliquis due to anemia and for upcoming kyphoplasty   Chronic Combined Systolic/Diastolic CHF - appears overall euvolemic, compensated on admission. --Meds held on admission due to low BP: Entresto, Coreg, Aldactone, Demadex --resumed on Demadex (6/6) --follow BP and  renal function --Monitor volume status - daily weights   Anxiety/Depression - continue SSRI   Obesity: Body mass index is 38.13 kg/m.  Complicates overall care and prognosis.  Recommend lifestyle modifications including physical activity and diet for weight loss and overall long-term health.     Discharge Instructions   Discharge Instructions     (HEART  FAILURE PATIENTS) Call MD:  Anytime you have any of the following symptoms: 1) 3 pound weight gain in 24 hours or 5 pounds in 1 week 2) shortness of breath, with or without a dry hacking cough 3) swelling in the hands, feet or stomach 4) if you have to sleep on extra pillows at night in order to breathe.   Complete by: As directed    Call MD for:  extreme fatigue   Complete by: As directed    Call MD for:  persistant dizziness or light-headedness   Complete by: As directed    Call MD for:  persistant nausea and vomiting   Complete by: As directed    Call MD for:  severe uncontrolled pain   Complete by: As directed    Call MD for:  temperature >100.4   Complete by: As directed    Diet - low sodium heart healthy   Complete by: As directed    Discharge instructions   Complete by: As directed    BLOOD PRESSURE / HEART MEDS ---  Please keep an eye on your BP.  It has been pretty normal in the hospital with your medications held. I suspect your BP will increase once you're home eating your usual diet. Pain medications, however, do lower BP, so it will run lower while you're taking pain medicine.  I restarted your torsemide (aka Demadex) and spironolactone for when you go home. I HELD your carvedilol (aka Coreg) and Entresto - for now  Please make an appointment for close follow up with your primary care provider to review and restart meds when they're needed again.    IRON DEFICIENCY - your hemoglobin is improving nicely, 9.1 today.  Recommend repeat CBC in about 1-2 weeks. Take iron supplement daily.  Be aware it can make your stools dark, and can cause constipation. Use an over-the-counter stool softener like Miralax or Senna to prevent constipation.   Increase activity slowly   Complete by: As directed       Allergies as of 02/20/2021   No Known Allergies      Medication List     STOP taking these medications    carvedilol 25 MG tablet Commonly known as: COREG   Eliquis 5 MG  Tabs tablet Generic drug: apixaban   Entresto 97-103 MG Generic drug: sacubitril-valsartan       TAKE these medications    acetaminophen 650 MG CR tablet Commonly known as: TYLENOL Take 650 mg by mouth every 8 (eight) hours as needed for pain.   escitalopram 10 MG tablet Commonly known as: LEXAPRO Take 1 tablet by mouth daily.   Farxiga 10 MG Tabs tablet Generic drug: dapagliflozin propanediol TAKE 1 TABLET BY MOUTH DAILY BEFORE BREAKFAST   ferrous sulfate 325 (65 FE) MG tablet Take 1 tablet (325 mg total) by mouth daily with breakfast.   rosuvastatin 20 MG tablet Commonly known as: CRESTOR TAKE 1 TABLET BY MOUTH EVERY DAY   sildenafil 100 MG tablet Commonly known as: VIAGRA TAKE 0.5 TABLETS (50 MG TOTAL) BY MOUTH AT BEDTIME AS NEEDED FOR ERECTILE DYSFUNCTION.   spironolactone 25 MG tablet Commonly  known as: ALDACTONE TAKE 1 TABLET BY MOUTH EVERY DAY   torsemide 20 MG tablet Commonly known as: DEMADEX Take 3 tablets (60 mg total) by mouth daily.   traZODone 50 MG tablet Commonly known as: DESYREL Take 1 tablet (50 mg total) by mouth at bedtime as needed.        Follow-up Information     Shelva Majestic, MD. Schedule an appointment as soon as possible for a visit in 1 week(s).   Specialty: Family Medicine Why: Hospital follow up for iron deficiency anemia. Contact information: [address] [place] [address] [phone]         Laurey Morale, MD .   Specialty: Cardiology Contact information: [address] [place] [address] [phone]         Samson Frederic, MD. Go to.   Specialty: Orthopedic Surgery Why: Follow up as scheduled Contact information: [address] [place] [address] [phone]         Venita Lick, MD. Go to.   Specialty: Orthopedic Surgery Why: Follow up as scheduled for kyphoplasty  Contact information: [address] [place]  [address] [phone]                No Known Allergies   If you experience worsening of your admission symptoms, develop shortness of breath, life threatening emergency, suicidal or homicidal thoughts you must seek medical attention immediately by calling 911 or calling your MD immediately  if symptoms less severe.    Please note   You were cared for by a hospitalist during your hospital stay. If you have any questions about your discharge medications or the care you received while you were in the hospital after you are discharged, you can call the unit and asked to speak with the hospitalist on call if the hospitalist that took care of you is not available. Once you are discharged, your primary care physician will handle any further medical issues. Please note that NO REFILLS for any discharge medications will be authorized once you are discharged, as it is imperative that you return to your primary care physician (or establish a relationship with a primary care physician if you do not have one) for your aftercare needs so that they can reassess your need for medications and monitor your lab values.   Consultations: none    Procedures/Studies: DG Chest 2 View  Result Date: 02/15/2021 CLINICAL DATA:  Preop back surgery EXAM: CHEST - 2 VIEW COMPARISON:  08/23/2020 FINDINGS: Bilateral shoulder replacements. Left-sided single lead pacing device as before. Linear scarring or atelectasis at the bases. No acute airspace disease, pleural effusion, or pneumothorax. Normal cardiomediastinal silhouette. Degenerative changes of the spine IMPRESSION: No active cardiopulmonary disease. Minimal scarring or atelectasis at the bases Electronically Signed   By: Jasmine Pang M.D.   On: 02/15/2021 21:41   DG THORACOLUMABAR SPINE  Result Date: 02/22/2021 CLINICAL DATA:  T12 kyphoplasty. EXAM: DG C-ARM 1-60 MIN; THORACOLUMBAR SPINE - 2 VIEW FLUOROSCOPY TIME:  Fluoroscopy Time:  1 minutes 28 seconds.  Radiation Exposure Index (if provided by the fluoroscopic device): 63.47 mGy Number of Acquired Spot Images: 3 COMPARISON:  CT February 16, 2021. FINDINGS: Three different C-arm fluoroscopic images were obtained intraoperatively and submitted for post operative interpretation. These images demonstrate T12 kyphoplasty without appreciable cement extravasation. No unexpected findings. Similar vertebral body height loss. Please see the performing provider's procedural report for further detail. IMPRESSION: T12 kyphoplasty. Electronically Signed  By: Feliberto Harts MD   On: 02/22/2021 13:00   CT Head Wo Contrast  Result Date: 02/16/2021 CLINICAL DATA:  Fall last week.  Bruising around eyes. EXAM: CT HEAD WITHOUT CONTRAST TECHNIQUE: Contiguous axial images were obtained from the base of the skull through the vertex without intravenous contrast. COMPARISON:  None. FINDINGS: Brain: No acute intracranial abnormality. Specifically, no hemorrhage, hydrocephalus, mass lesion, acute infarction, or significant intracranial injury. Vascular: No hyperdense vessel or unexpected calcification. Skull: No acute calvarial abnormality. Sinuses/Orbits: Orbital soft tissues unremarkable. No visible orbital fracture. Paranasal sinuses clear. Other: None IMPRESSION: No acute intracranial abnormality. Electronically Signed   By: Charlett Nose M.D.   On: 02/16/2021 17:40   DG C-Arm 1-60 Min  Result Date: 02/22/2021 CLINICAL DATA:  T12 kyphoplasty. EXAM: DG C-ARM 1-60 MIN; THORACOLUMBAR SPINE - 2 VIEW FLUOROSCOPY TIME:  Fluoroscopy Time:  1 minutes 28 seconds. Radiation Exposure Index (if provided by the fluoroscopic device): 63.47 mGy Number of Acquired Spot Images: 3 COMPARISON:  CT February 16, 2021. FINDINGS: Three different C-arm fluoroscopic images were obtained intraoperatively and submitted for post operative interpretation. These images demonstrate T12 kyphoplasty without appreciable cement extravasation. No unexpected findings. Similar  vertebral body height loss. Please see the performing provider's procedural report for further detail. IMPRESSION: T12 kyphoplasty. Electronically Signed   By: Feliberto Harts MD   On: 02/22/2021 13:00   CT CHEST ABDOMEN PELVIS WO CONTRAST  Result Date: 02/16/2021 CLINICAL DATA:  Fall last week, right rib pain.  Low hemoglobin. EXAM: CT CHEST, ABDOMEN AND PELVIS WITHOUT CONTRAST TECHNIQUE: Multidetector CT imaging of the chest, abdomen and pelvis was performed following the standard protocol without IV contrast. COMPARISON:  None. FINDINGS: CT CHEST FINDINGS Cardiovascular: Left side pacer in place with lead in the right ventricle. Heart is normal size. Coronary artery and aortic calcifications. No aneurysm. Mediastinum/Nodes: No mediastinal, hilar, or axillary adenopathy. Trachea and esophagus are unremarkable. Thyroid unremarkable. Lungs/Pleura: Minimal right base and dependent atelectasis. No confluent opacities or effusions. Musculoskeletal: Multiple old bilateral rib fractures. Acute right rib fractures in the anterolateral 6 through 9th right ribs. No pneumothorax. CT ABDOMEN PELVIS FINDINGS Hepatobiliary: No hepatic injury or perihepatic hematoma. Gallbladder is unremarkable Pancreas: No focal abnormality or ductal dilatation. Spleen: No splenic injury or perisplenic hematoma. Adrenals/Urinary Tract: No adrenal hemorrhage or renal injury identified. Bladder is unremarkable. Stomach/Bowel: Stomach, large and small bowel grossly unremarkable. Vascular/Lymphatic: Aortic atherosclerosis. No evidence of aneurysm or adenopathy. Reproductive: No visible focal abnormality. Other: No free fluid or free air. Musculoskeletal: Prior left hip replacement. No acute bony abnormality. IMPRESSION: Acute fractures through the right anterolateral 6th through 9th ribs. Multiple old bilateral rib fractures. Minimal right base and dependent atelectasis. No pneumothorax. Coronary artery disease, aortic atherosclerosis. No  acute findings in the abdomen or pelvis. Electronically Signed   By: Charlett Nose M.D.   On: 02/16/2021 17:45      Subjective: Pt feels well today; pain overall controlled.  No CP, SOB, N/V.  Discussed BP still lower and staying off BP meds for now, watch BP at home and see PCP in close follow up. He agrees. Glad to hear he should get his kypho done on schedule.   Discharge Exam: Vitals:   02/19/21 2018 02/20/21 0531  BP: 127/69 (!) 148/79  Pulse: 79 94  Resp: 18 20  Temp: 98.1 F (36.7 C) 98 F (36.7 C)  SpO2: 98% 99%   Vitals:   02/18/21 2040 02/19/21 1336 02/19/21 2018 02/20/21 0531  BP: (!) 111/91 128/81 127/69 (!) 148/79  Pulse: 73 69 79 94  Resp: 18 16 18 20   Temp: 98.9 F (37.2 C) 98 F (36.7 C) 98.1 F (36.7 C) 98 F (36.7 C)  TempSrc: Oral Oral    SpO2: 97% 99% 98% 99%  Weight:      Height:        General: Pt is alert, awake, not in acute distress, obese Cardiovascular: RRR, S1/S2 +, no rubs, no gallops Respiratory: CTA bilaterally, no wheezing, no rhonchi Abdominal: Soft, NT, ND, bowel sounds + Extremities: no edema, no cyanosis    The results of significant diagnostics from this hospitalization (including imaging, microbiology, ancillary and laboratory) are listed below for reference.     Microbiology: No results found for this or any previous visit (from the past 240 hour(s)).    Labs: BNP (last 3 results) Recent Labs    10/17/20 1507 11/01/20 1202 02/16/21 1648  BNP 1,380.7* 1,130.0* 437.3*   Basic Metabolic Panel: No results for input(s): NA, K, CL, CO2, GLUCOSE, BUN, CREATININE, CALCIUM, MG, PHOS in the last 168 hours.  Liver Function Tests: No results for input(s): AST, ALT, ALKPHOS, BILITOT, PROT, ALBUMIN in the last 168 hours.  No results for input(s): LIPASE, AMYLASE in the last 168 hours.  No results for input(s): AMMONIA in the last 168 hours. CBC: No results for input(s): WBC, NEUTROABS, HGB, HCT, MCV, PLT in the last 168  hours.  Cardiac Enzymes: No results for input(s): CKTOTAL, CKMB, CKMBINDEX, TROPONINI in the last 168 hours. BNP: Invalid input(s): POCBNP CBG: No results for input(s): GLUCAP in the last 168 hours. D-Dimer No results for input(s): DDIMER in the last 72 hours. Hgb A1c No results for input(s): HGBA1C in the last 72 hours. Lipid Profile No results for input(s): CHOL, HDL, LDLCALC, TRIG, CHOLHDL, LDLDIRECT in the last 72 hours. Thyroid function studies No results for input(s): TSH, T4TOTAL, T3FREE, THYROIDAB in the last 72 hours.  Invalid input(s): FREET3 Anemia work up No results for input(s): VITAMINB12, FOLATE, FERRITIN, TIBC, IRON, RETICCTPCT in the last 72 hours.  Urinalysis    Component Value Date/Time   COLORURINE COLORLESS (A) 02/16/2021 1906   APPEARANCEUR CLEAR 02/16/2021 1906   LABSPEC <1.005 (L) 02/16/2021 1906   PHURINE 5.0 02/16/2021 1906   GLUCOSEU 100 (A) 02/16/2021 1906   HGBUR NEGATIVE 02/16/2021 1906   BILIRUBINUR NEGATIVE 02/16/2021 1906   KETONESUR NEGATIVE 02/16/2021 1906   PROTEINUR NEGATIVE 02/16/2021 1906   UROBILINOGEN 0.2 03/21/2015 0903   NITRITE NEGATIVE 02/16/2021 1906   LEUKOCYTESUR NEGATIVE 02/16/2021 1906   Sepsis Labs Invalid input(s): PROCALCITONIN,  WBC,  LACTICIDVEN Microbiology No results found for this or any previous visit (from the past 240 hour(s)).    Time coordinating discharge: Over 30 minutes  SIGNED:   Pennie Banter, DO [facility] 03/07/2021, 6:06 PM   If 7PM-7AM, please contact night-coverage [email or web address]

## 2021-02-20 NOTE — Plan of Care (Signed)

## 2021-02-20 NOTE — Telephone Encounter (Signed)
Per recent hospitalization "excessive alcohol use recently". Ativan should not be mixed with alcohol and is not a good choice at all for someone with recurrent issues with alcohol. . I am not going to be able to prescribe this medicine if patient continues to have recurrent periods of increased alcohol intake.   He was also supposed to schedule a 2 month visit with me (is scheduled for another month away)   I can prescribe a short course to help wean him off of this if he would like but I continue to be concerned about prescribing this for him.

## 2021-02-21 ENCOUNTER — Other Ambulatory Visit: Payer: Self-pay | Admitting: Family Medicine

## 2021-02-21 NOTE — Anesthesia Preprocedure Evaluation (Addendum)
Anesthesia Evaluation  Patient identified by MRN, date of birth, ID band Patient awake    Reviewed: Allergy & Precautions, NPO status , Patient's Chart, lab work & pertinent test results  History of Anesthesia Complications Negative for: history of anesthetic complications  Airway Mallampati: III  TM Distance: >3 FB Neck ROM: Full    Dental  (+) Teeth Intact, Dental Advisory Given   Pulmonary sleep apnea and Continuous Positive Airway Pressure Ventilation ,    Pulmonary exam normal        Cardiovascular hypertension, +CHF (NICM)  Normal cardiovascular exam+ dysrhythmias Atrial Fibrillation + Cardiac Defibrillator      Neuro/Psych PSYCHIATRIC DISORDERS Anxiety Depression negative neurological ROS     GI/Hepatic negative GI ROS, (+)     substance abuse  alcohol use,   Endo/Other  negative endocrine ROS  Renal/GU negative Renal ROS  negative genitourinary   Musculoskeletal  (+) Arthritis , Osteoarthritis,    Abdominal   Peds  Hematology  (+) anemia , Received 1U PRBC during recent admission, Hgb 9.1   Anesthesia Other Findings  Echo 01/16/21: EF 25-30%, global LV hypokinesis, g1dd, normal RV function, valves unremarkable  Reproductive/Obstetrics                            Anesthesia Physical Anesthesia Plan  ASA: IV  Anesthesia Plan: General   Post-op Pain Management:    Induction: Intravenous  PONV Risk Score and Plan: 2 and Ondansetron, Dexamethasone, Treatment may vary due to age or medical condition and Midazolam  Airway Management Planned: Oral ETT  Additional Equipment: None  Intra-op Plan:   Post-operative Plan: Extubation in OR  Informed Consent: I have reviewed the patients History and Physical, chart, labs and discussed the procedure including the risks, benefits and alternatives for the proposed anesthesia with the patient or authorized representative who has  indicated his/her understanding and acceptance.     Dental advisory given  Plan Discussed with:   Anesthesia Plan Comments: (Will require reprogramming of AICD due to prone prosition in OR.)      Anesthesia Quick Evaluation

## 2021-02-21 NOTE — Anesthesia Preprocedure Evaluation (Deleted)
Anesthesia Evaluation Anesthesia Physical Anesthesia Plan  ASA:   Anesthesia Plan:    Post-op Pain Management:    Induction:   PONV Risk Score and Plan:   Airway Management Planned:   Additional Equipment:   Intra-op Plan:   Post-operative Plan:   Informed Consent:   Plan Discussed with:   Anesthesia Plan Comments: (See PAT note written by Shonna Chock, PA-C regarding history and recent admission, discharged 02/20/21. Dr. Shon Baton spoke with Hospitalist who felt patient stable to proceed as scheduled 02/22/21.  )       Anesthesia Quick Evaluation

## 2021-02-22 ENCOUNTER — Encounter (HOSPITAL_COMMUNITY): Payer: Self-pay | Admitting: Orthopedic Surgery

## 2021-02-22 ENCOUNTER — Ambulatory Visit (HOSPITAL_COMMUNITY): Payer: 59

## 2021-02-22 ENCOUNTER — Ambulatory Visit (HOSPITAL_COMMUNITY): Payer: 59 | Admitting: Vascular Surgery

## 2021-02-22 ENCOUNTER — Ambulatory Visit (HOSPITAL_COMMUNITY): Payer: 59 | Admitting: Anesthesiology

## 2021-02-22 ENCOUNTER — Observation Stay (HOSPITAL_COMMUNITY)
Admission: RE | Admit: 2021-02-22 | Discharge: 2021-02-23 | Disposition: A | Discharge status: Home / Self Care | Payer: 59 | Attending: Orthopedic Surgery | Admitting: Orthopedic Surgery

## 2021-02-22 ENCOUNTER — Encounter (HOSPITAL_COMMUNITY)
Admission: RE | Disposition: A | Discharge status: Home / Self Care | Payer: Self-pay | Source: Home / Self Care | Attending: Orthopedic Surgery

## 2021-02-22 ENCOUNTER — Other Ambulatory Visit: Payer: Self-pay

## 2021-02-22 DIAGNOSIS — I110 Hypertensive heart disease with heart failure: Secondary | ICD-10-CM | POA: Diagnosis not present

## 2021-02-22 DIAGNOSIS — S22000A Wedge compression fracture of unspecified thoracic vertebra, initial encounter for closed fracture: Secondary | ICD-10-CM | POA: Diagnosis present

## 2021-02-22 DIAGNOSIS — I5042 Chronic combined systolic (congestive) and diastolic (congestive) heart failure: Secondary | ICD-10-CM | POA: Insufficient documentation

## 2021-02-22 DIAGNOSIS — Z419 Encounter for procedure for purposes other than remedying health state, unspecified: Secondary | ICD-10-CM

## 2021-02-22 DIAGNOSIS — Z95810 Presence of automatic (implantable) cardiac defibrillator: Secondary | ICD-10-CM | POA: Diagnosis not present

## 2021-02-22 DIAGNOSIS — Z96642 Presence of left artificial hip joint: Secondary | ICD-10-CM | POA: Diagnosis not present

## 2021-02-22 DIAGNOSIS — M8088XA Other osteoporosis with current pathological fracture, vertebra(e), initial encounter for fracture: Secondary | ICD-10-CM | POA: Diagnosis not present

## 2021-02-22 HISTORY — PX: KYPHOPLASTY: SHX5884

## 2021-02-22 LAB — GLUCOSE, CAPILLARY
Glucose-Capillary: 143 mg/dL — ABNORMAL HIGH (ref 70–99)
Glucose-Capillary: 161 mg/dL — ABNORMAL HIGH (ref 70–99)
Glucose-Capillary: 236 mg/dL — ABNORMAL HIGH (ref 70–99)
Glucose-Capillary: 163 mg/dL — ABNORMAL HIGH (ref 70–99)

## 2021-02-22 LAB — PROTIME-INR
INR: 1 (ref 0.8–1.2)
Prothrombin Time: 12.8 seconds (ref 11.4–15.2)

## 2021-02-22 LAB — APTT: aPTT: 31 seconds (ref 24–36)

## 2021-02-22 SURGERY — KYPHOPLASTY
Anesthesia: General | Site: Spine Thoracic

## 2021-02-22 MED ORDER — FERROUS SULFATE 325 (65 FE) MG PO TABS
325.0000 mg | ORAL_TABLET | Freq: Every day | ORAL | Status: DC
  Administered 2021-02-23: 325 mg via ORAL
  Filled 2021-02-22: qty 1

## 2021-02-22 MED ORDER — FENTANYL CITRATE (PF) 100 MCG/2ML IJ SOLN
INTRAMUSCULAR | Status: DC | PRN
  Administered 2021-02-22: 100 ug via INTRAVENOUS

## 2021-02-22 MED ORDER — CHLORHEXIDINE GLUCONATE 0.12 % MT SOLN
OROMUCOSAL | Status: AC
  Administered 2021-02-22: 15 mL via OROMUCOSAL
  Filled 2021-02-22: qty 15

## 2021-02-22 MED ORDER — ORAL CARE MOUTH RINSE: 15.0000 mL | Freq: Once | OROMUCOSAL | Status: AC

## 2021-02-22 MED ORDER — LIDOCAINE HCL (PF) 2 % IJ SOLN
INTRAMUSCULAR | Status: AC
  Filled 2021-02-22: qty 5

## 2021-02-22 MED ORDER — ONDANSETRON HCL 4 MG/2ML IJ SOLN: 4.0000 mg | Freq: Once | INTRAMUSCULAR | Status: DC | PRN

## 2021-02-22 MED ORDER — MENTHOL 3 MG MT LOZG: 1.0000 | LOZENGE | OROMUCOSAL | Status: DC | PRN

## 2021-02-22 MED ORDER — CEFAZOLIN IN SODIUM CHLORIDE 3-0.9 GM/100ML-% IV SOLN
3.0000 g | INTRAVENOUS | Status: AC
  Administered 2021-02-22: 3 g via INTRAVENOUS
  Filled 2021-02-22: qty 100

## 2021-02-22 MED ORDER — DEXAMETHASONE SODIUM PHOSPHATE 10 MG/ML IJ SOLN
INTRAMUSCULAR | Status: DC | PRN
  Administered 2021-02-22: 5 mg via INTRAVENOUS

## 2021-02-22 MED ORDER — ROCURONIUM BROMIDE 10 MG/ML (PF) SYRINGE
PREFILLED_SYRINGE | INTRAVENOUS | Status: DC | PRN
  Administered 2021-02-22: 50 mg via INTRAVENOUS

## 2021-02-22 MED ORDER — DAPAGLIFLOZIN PROPANEDIOL 10 MG PO TABS
10.0000 mg | ORAL_TABLET | Freq: Every day | ORAL | Status: DC
  Administered 2021-02-23: 10 mg via ORAL
  Filled 2021-02-22: qty 1

## 2021-02-22 MED ORDER — OXYCODONE HCL 5 MG/5ML PO SOLN: 5.0000 mg | Freq: Once | ORAL | Status: DC | PRN

## 2021-02-22 MED ORDER — MIDAZOLAM HCL 5 MG/5ML IJ SOLN
INTRAMUSCULAR | Status: DC | PRN
  Administered 2021-02-22: 2 mg via INTRAVENOUS

## 2021-02-22 MED ORDER — BUPIVACAINE LIPOSOME 1.3 % IJ SUSP
INTRAMUSCULAR | Status: DC | PRN
  Administered 2021-02-22: 20 mL

## 2021-02-22 MED ORDER — ONDANSETRON HCL 4 MG/2ML IJ SOLN: 4.0000 mg | Freq: Four times a day (QID) | INTRAMUSCULAR | Status: DC | PRN

## 2021-02-22 MED ORDER — CEFAZOLIN SODIUM-DEXTROSE 2-4 GM/100ML-% IV SOLN
2.0000 g | Freq: Three times a day (TID) | INTRAVENOUS | Status: AC
  Administered 2021-02-22 (×2): 2 g via INTRAVENOUS
  Filled 2021-02-22 (×2): qty 100

## 2021-02-22 MED ORDER — LACTATED RINGERS IV SOLN: INTRAVENOUS | Status: DC

## 2021-02-22 MED ORDER — ONDANSETRON HCL 4 MG PO TABS: 4.0000 mg | ORAL_TABLET | Freq: Four times a day (QID) | ORAL | Status: DC | PRN

## 2021-02-22 MED ORDER — SODIUM CHLORIDE 0.9 % IV SOLN: 250.0000 mL | INTRAVENOUS | Status: DC

## 2021-02-22 MED ORDER — ROCURONIUM BROMIDE 10 MG/ML (PF) SYRINGE
PREFILLED_SYRINGE | INTRAVENOUS | Status: AC
  Filled 2021-02-22: qty 10

## 2021-02-22 MED ORDER — HYDROMORPHONE HCL 1 MG/ML IJ SOLN: 0.2500 mg | INTRAMUSCULAR | Status: DC | PRN

## 2021-02-22 MED ORDER — ONDANSETRON HCL 4 MG/2ML IJ SOLN
INTRAMUSCULAR | Status: AC
  Filled 2021-02-22: qty 2

## 2021-02-22 MED ORDER — LORAZEPAM 0.5 MG PO TABS
1.0000 mg | ORAL_TABLET | Freq: Every day | ORAL | Status: DC | PRN
  Administered 2021-02-22 – 2021-02-23 (×2): 1 mg via ORAL
  Filled 2021-02-22 (×2): qty 2

## 2021-02-22 MED ORDER — ROSUVASTATIN CALCIUM 20 MG PO TABS
20.0000 mg | ORAL_TABLET | Freq: Every day | ORAL | Status: DC
  Administered 2021-02-22: 20 mg via ORAL
  Filled 2021-02-22: qty 1

## 2021-02-22 MED ORDER — SODIUM CHLORIDE 0.9% FLUSH: 3.0000 mL | INTRAVENOUS | Status: DC | PRN

## 2021-02-22 MED ORDER — DEXAMETHASONE SODIUM PHOSPHATE 10 MG/ML IJ SOLN
INTRAMUSCULAR | Status: AC
  Filled 2021-02-22: qty 1

## 2021-02-22 MED ORDER — ONDANSETRON HCL 4 MG/2ML IJ SOLN
INTRAMUSCULAR | Status: DC | PRN
  Administered 2021-02-22: 4 mg via INTRAVENOUS

## 2021-02-22 MED ORDER — PROPOFOL 10 MG/ML IV BOLUS
INTRAVENOUS | Status: DC | PRN
  Administered 2021-02-22: 30 mg via INTRAVENOUS
  Administered 2021-02-22: 70 mg via INTRAVENOUS

## 2021-02-22 MED ORDER — INSULIN ASPART 100 UNIT/ML IJ SOLN: 0.0000 [IU] | Freq: Three times a day (TID) | INTRAMUSCULAR | Status: DC

## 2021-02-22 MED ORDER — BUPIVACAINE LIPOSOME 1.3 % IJ SUSP
INTRAMUSCULAR | Status: AC
  Filled 2021-02-22: qty 20

## 2021-02-22 MED ORDER — ESCITALOPRAM OXALATE 10 MG PO TABS
10.0000 mg | ORAL_TABLET | Freq: Every day | ORAL | Status: DC
  Administered 2021-02-22: 10 mg via ORAL
  Filled 2021-02-22 (×2): qty 1

## 2021-02-22 MED ORDER — PHENOL 1.4 % MT LIQD: 1.0000 | OROMUCOSAL | Status: DC | PRN

## 2021-02-22 MED ORDER — INSULIN ASPART 100 UNIT/ML IJ SOLN: 0.0000 [IU] | Freq: Every day | INTRAMUSCULAR | Status: DC

## 2021-02-22 MED ORDER — TRAMADOL HCL 50 MG PO TABS
50.0000 mg | ORAL_TABLET | ORAL | Status: DC | PRN
Start: 2021-02-22 — End: 2021-02-23
  Administered 2021-02-22 – 2021-02-23 (×2): 50 mg via ORAL
  Filled 2021-02-22 (×2): qty 1

## 2021-02-22 MED ORDER — TRAMADOL HCL 50 MG PO TABS: 50.0000 mg | ORAL_TABLET | Freq: Four times a day (QID) | ORAL | 0 refills | Status: AC | PRN

## 2021-02-22 MED ORDER — ACETAMINOPHEN 325 MG PO TABS
650.0000 mg | ORAL_TABLET | ORAL | Status: DC | PRN
  Administered 2021-02-22 – 2021-02-23 (×2): 650 mg via ORAL
  Filled 2021-02-22 (×2): qty 2

## 2021-02-22 MED ORDER — SUGAMMADEX SODIUM 200 MG/2ML IV SOLN
INTRAVENOUS | Status: DC | PRN
  Administered 2021-02-22: 200 mg via INTRAVENOUS

## 2021-02-22 MED ORDER — OXYCODONE HCL 5 MG PO TABS: 5.0000 mg | ORAL_TABLET | Freq: Once | ORAL | Status: DC | PRN

## 2021-02-22 MED ORDER — FENTANYL CITRATE (PF) 250 MCG/5ML IJ SOLN
INTRAMUSCULAR | Status: AC
  Filled 2021-02-22: qty 5

## 2021-02-22 MED ORDER — BUPIVACAINE-EPINEPHRINE 0.25% -1:200000 IJ SOLN
INTRAMUSCULAR | Status: DC | PRN
  Administered 2021-02-22: 30 mL

## 2021-02-22 MED ORDER — CHLORHEXIDINE GLUCONATE 0.12 % MT SOLN: 15.0000 mL | Freq: Once | OROMUCOSAL | Status: AC

## 2021-02-22 MED ORDER — ONDANSETRON HCL 4 MG PO TABS: 4.0000 mg | ORAL_TABLET | Freq: Three times a day (TID) | ORAL | 0 refills | Status: AC | PRN

## 2021-02-22 MED ORDER — SODIUM CHLORIDE 0.9% FLUSH
3.0000 mL | Freq: Two times a day (BID) | INTRAVENOUS | Status: DC
  Administered 2021-02-22 (×2): 3 mL via INTRAVENOUS

## 2021-02-22 MED ORDER — AMISULPRIDE (ANTIEMETIC) 5 MG/2ML IV SOLN: 10.0000 mg | Freq: Once | INTRAVENOUS | Status: DC | PRN

## 2021-02-22 MED ORDER — PROPOFOL 10 MG/ML IV BOLUS
INTRAVENOUS | Status: AC
  Filled 2021-02-22: qty 20

## 2021-02-22 MED ORDER — TORSEMIDE 20 MG PO TABS
60.0000 mg | ORAL_TABLET | Freq: Every day | ORAL | Status: DC
  Administered 2021-02-22: 60 mg via ORAL
  Filled 2021-02-22 (×2): qty 3

## 2021-02-22 MED ORDER — 0.9 % SODIUM CHLORIDE (POUR BTL) OPTIME
TOPICAL | Status: DC | PRN
  Administered 2021-02-22: 1000 mL

## 2021-02-22 MED ORDER — BUPIVACAINE-EPINEPHRINE (PF) 0.25% -1:200000 IJ SOLN
INTRAMUSCULAR | Status: AC
  Filled 2021-02-22: qty 30

## 2021-02-22 MED ORDER — ACETAMINOPHEN 650 MG RE SUPP: 650.0000 mg | RECTAL | Status: DC | PRN

## 2021-02-22 MED ORDER — IOPAMIDOL (ISOVUE-300) INJECTION 61%
INTRAVENOUS | Status: DC | PRN
  Administered 2021-02-22: 10 mL via ORAL

## 2021-02-22 MED ORDER — TRAMADOL HCL 50 MG PO TABS
50.0000 mg | ORAL_TABLET | Freq: Four times a day (QID) | ORAL | Status: DC | PRN
Start: 2021-02-22 — End: 2021-02-22
  Administered 2021-02-22 (×2): 50 mg via ORAL
  Filled 2021-02-22 (×2): qty 1

## 2021-02-22 MED ORDER — LIDOCAINE 2% (20 MG/ML) 5 ML SYRINGE
INTRAMUSCULAR | Status: DC | PRN
  Administered 2021-02-22: 60 mg via INTRAVENOUS

## 2021-02-22 MED ORDER — INSULIN ASPART 100 UNIT/ML IJ SOLN
0.0000 [IU] | INTRAMUSCULAR | Status: DC
  Administered 2021-02-22: 5 [IU] via SUBCUTANEOUS
  Administered 2021-02-22: 3 [IU] via SUBCUTANEOUS

## 2021-02-22 MED ORDER — MIDAZOLAM HCL 2 MG/2ML IJ SOLN
INTRAMUSCULAR | Status: AC
  Filled 2021-02-22: qty 2

## 2021-02-22 MED ORDER — SPIRONOLACTONE 25 MG PO TABS
25.0000 mg | ORAL_TABLET | Freq: Every day | ORAL | Status: DC
  Filled 2021-02-22: qty 1

## 2021-02-22 SURGICAL SUPPLY — 43 items
BLADE SURG 15 STRL LF DISP TIS (BLADE) ×1 IMPLANT
BLADE SURG 15 STRL SS (BLADE) ×1
BNDG ADH 1X3 SHEER STRL LF (GAUZE/BANDAGES/DRESSINGS) ×2 IMPLANT
CEMENT KYPHON CX01A KIT/MIXER (Cement) ×2 IMPLANT
COVER MAYO STAND STRL (DRAPES) ×2 IMPLANT
COVER SURGICAL LIGHT HANDLE (MISCELLANEOUS) ×2 IMPLANT
COVER WAND RF STERILE (DRAPES) IMPLANT
CURETTE EXPRESS SZ2 7MM (INSTRUMENTS) IMPLANT
CURETTE WEDGE 8.5MM KYPHX (MISCELLANEOUS) IMPLANT
CURRETTE EXPRESS SZ2 7MM (INSTRUMENTS)
DERMABOND ADVANCED (GAUZE/BANDAGES/DRESSINGS) ×1
DERMABOND ADVANCED .7 DNX12 (GAUZE/BANDAGES/DRESSINGS) ×1 IMPLANT
DRAPE C-ARM 42X72 X-RAY (DRAPES) ×4 IMPLANT
DRAPE INCISE IOBAN 66X45 STRL (DRAPES) ×2 IMPLANT
DRAPE LAPAROTOMY T 102X78X121 (DRAPES) ×2 IMPLANT
DRAPE WARM FLUID 44X44 (DRAPES) ×2 IMPLANT
DURAPREP 26ML APPLICATOR (WOUND CARE) ×2 IMPLANT
GLOVE BIO SURGEON STRL SZ 6.5 (GLOVE) ×2 IMPLANT
GLOVE BIOGEL PI IND STRL 8.5 (GLOVE) IMPLANT
GLOVE BIOGEL PI INDICATOR 8.5 (GLOVE)
GLOVE SS BIOGEL STRL SZ 8.5 (GLOVE) ×1 IMPLANT
GLOVE SUPERSENSE BIOGEL SZ 8.5 (GLOVE) ×1
GLOVE SURG UNDER POLY LF SZ6.5 (GLOVE) ×2 IMPLANT
GOWN STRL REUS W/ TWL LRG LVL3 (GOWN DISPOSABLE) ×2 IMPLANT
GOWN STRL REUS W/TWL 2XL LVL3 (GOWN DISPOSABLE) ×2 IMPLANT
GOWN STRL REUS W/TWL LRG LVL3 (GOWN DISPOSABLE) ×2
KIT BASIN OR (CUSTOM PROCEDURE TRAY) ×2 IMPLANT
KIT TURNOVER KIT B (KITS) ×2 IMPLANT
NEEDLE HYPO 22GX1.5 SAFETY (NEEDLE) IMPLANT
NEEDLE HYPO 25GX1X1/2 BEV (NEEDLE) ×2 IMPLANT
NEEDLE SPNL 18GX3.5 QUINCKE PK (NEEDLE) ×4 IMPLANT
NEEDLE SPNL 22GX3.5 QUINCKE BK (NEEDLE) ×2 IMPLANT
NS IRRIG 1000ML POUR BTL (IV SOLUTION) ×2 IMPLANT
PACK SURGICAL SETUP 50X90 (CUSTOM PROCEDURE TRAY) ×2 IMPLANT
PAD ARMBOARD 7.5X6 YLW CONV (MISCELLANEOUS) ×4 IMPLANT
SPONGE LAP 4X18 RFD (DISPOSABLE) ×2 IMPLANT
SUT MNCRL AB 3-0 PS2 18 (SUTURE) ×2 IMPLANT
SYR CONTROL 10ML LL (SYRINGE) ×2 IMPLANT
TAMP INFLATABLE BONE 15/2 SU (ORTHOPEDIC DISPOSABLE SUPPLIES) ×4 IMPLANT
TOWEL GREEN STERILE (TOWEL DISPOSABLE) ×2 IMPLANT
TRAY KYPHOPAK 15/3 ONESTEP 1ST (MISCELLANEOUS) ×2 IMPLANT
TRAY KYPHOPAK 20/3 ONESTEP 1ST (MISCELLANEOUS) IMPLANT
WATER STERILE IRR 1000ML POUR (IV SOLUTION) ×2 IMPLANT

## 2021-02-22 NOTE — Brief Op Note (Signed)
02/22/2021  8:41 AM  PATIENT:  John [name]  60 y.o. male  PRE-OPERATIVE DIAGNOSIS:  T12 compression fracture  POST-OPERATIVE DIAGNOSIS:  T12 compression fracture  PROCEDURE:  Procedure(s) with comments: KYPHOPLASTY T12 (N/A) - 90 mins  SURGEON:  Surgeon(s) and Role:    Venita Lick, MD - Primary  PHYSICIAN ASSISTANT:   ASSISTANTS: none   ANESTHESIA:   general  EBL:  minimal   BLOOD ADMINISTERED:none  DRAINS: none   LOCAL MEDICATIONS USED:  MARCAINE    and OTHER expaeral  SPECIMEN:  No Specimen  DISPOSITION OF SPECIMEN:  N/A  COUNTS:  YES  TOURNIQUET:  * No tourniquets in log *  DICTATION: .Dragon Dictation  PLAN OF CARE: Admit for overnight observation  PATIENT DISPOSITION:  PACU - hemodynamically stable.

## 2021-02-22 NOTE — Anesthesia Procedure Notes (Signed)
Procedure Name: Intubation Date/Time: 02/22/2021 7:46 AM Performed by: Moshe Salisbury, CRNA Pre-anesthesia Checklist: Patient identified, Emergency Drugs available, Suction available and Patient being monitored Patient Re-evaluated:Patient Re-evaluated prior to induction Oxygen Delivery Method: Circle System Utilized Preoxygenation: Pre-oxygenation with 100% oxygen Induction Type: IV induction Ventilation: Mask ventilation with difficulty and Oral airway inserted - appropriate to patient size Laryngoscope Size: Mac and 4 Grade View: Grade II Tube type: Oral Tube size: 8.0 mm Number of attempts: 2 (No ET CO2 present after first intubation, ETT out, Grade II view, AOI, Lungs very tight but positive ET CO2) Airway Equipment and Method: Stylet Placement Confirmation: ETT inserted through vocal cords under direct vision, positive ETCO2 and breath sounds checked- equal and bilateral Secured at: 23 cm Tube secured with: Tape Dental Injury: Teeth and Oropharynx as per pre-operative assessment

## 2021-02-22 NOTE — Anesthesia Postprocedure Evaluation (Signed)
Anesthesia Post Note  Patient: John [name]  Procedure(s) Performed: KYPHOPLASTY T12 (Spine Thoracic)     Patient location during evaluation: PACU Anesthesia Type: General Level of consciousness: awake and alert Pain management: pain level controlled Vital Signs Assessment: post-procedure vital signs reviewed and stable Respiratory status: spontaneous breathing, nonlabored ventilation and respiratory function stable Cardiovascular status: blood pressure returned to baseline and stable Postop Assessment: no apparent nausea or vomiting Anesthetic complications: no   No notable events documented.  Last Vitals:  Vitals:   02/22/21 0945 02/22/21 1000  BP: (!) 149/79 (!) 143/96  Pulse: 72 76  Resp: 18 16  Temp:  36.9 C  SpO2: 100% 96%    Last Pain:  Vitals:   02/22/21 1000  TempSrc:   PainSc: Asleep                 [name]

## 2021-02-22 NOTE — Op Note (Signed)
OPERATIVE REPORT  DATE OF SURGERY: 02/22/2021  PATIENT NAME:  John [name] MRN: [id number] DOB: 1961/01/20  PCP: Shelva Majestic, MD  PRE-OPERATIVE DIAGNOSIS: T12 osteoporotic compression fracture  POST-OPERATIVE DIAGNOSIS: Same  PROCEDURE:   T12 kyphoplasty  SURGEON:  Venita Lick, MD  PHYSICIAN ASSISTANT: None  ANESTHESIA:   General  EBL: Minimal  BRIEF HISTORY: John [name] is a 60 y.o. male who presented with significant lower thoracic pain.  Imaging studies demonstrated an acute T12 osteoporotic compression fracture.  After discussion the patient elected to move forward with surgery.  As result of his pre-existing medical comorbidities (morbid obesity, alcohol abuse, and sleep apnea) the decision was made to move forward with the surgery with general anesthesia and to admit the patient postoperatively for observation.  All appropriate risks, benefits, and alternatives to surgery were discussed with the patient and consent was obtained.  PROCEDURE DETAILS: Patient was brought into the operating room. After successful induction of general anesthesia and endotracheal intubation a Time Out was done. This confirmed all pertinent important data.  Patient was turned prone onto the Wilson frame and all bony prominences were well-padded.  Teds and SCDs were applied.  Using fluoroscopy identified the L5 vertebral body and counted up to the T12 fracture level.  Once I confirmed in both the AP and lateral planes that I was at the appropriate level I marked out the incision sites.  Small stab incision was made and a Jamshidi needle was advanced percutaneously down to the lateral aspect of the T12 pedicle.  I confirmed proper position on the AP and lateral plane x-rays.  I then advanced the Jamshidi needle into the T12 pedicle.  I continue to track its trajectory and position with the AP and lateral fluoroscopy images.  As I neared the medial wall of the pedicle on the AP view I  confirmed I was just beyond the posterior wall of the vertebral body on the lateral view.  I repeated this same technique on the contralateral side until he had bilateral pedicle cannulation with the Jamshidi needles.  I then placed the drill and then sounded the canal to ensure had a solid bony canal.  The inflatable bone tamps were then inserted and I created a cavity.  Approximately 8 cc of cement was inserted into the T12 vertebral body (4 cc per side).  Imaging was used during the insertion of the cement to ensure there was no leak.  The cement was allowed to harden and then the Jamshidi needles were removed.  Final x-rays demonstrate satisfactory positioning of the cement mantle within the T12 vertebral body.  The skin was then anesthetized with quarter percent Marcaine with Exparel for improved postoperative analgesia.  The small incisions were closed with an interrupted 3-0 Monocryl suture.  Skin glue and a Band-Aid were then applied.  The patient was ultimately extubated transfer the PACU without incident.  The end of the case all needle sponge counts were correct.  There were no adverse intraoperative events.  Venita Lick, MD 02/22/2021 8:35 AM

## 2021-02-22 NOTE — Progress Notes (Signed)
Remote ICD transmission.   

## 2021-02-22 NOTE — Transfer of Care (Signed)
Immediate Anesthesia Transfer of Care Note  Patient: John [name]  Procedure(s) Performed: KYPHOPLASTY T12 (Spine Thoracic)  Patient Location: PACU  Anesthesia Type:General  Level of Consciousness: awake and patient cooperative  Airway & Oxygen Therapy: Patient Spontanous Breathing and Patient connected to nasal cannula oxygen  Post-op Assessment: Report given to RN, Post -op Vital signs reviewed and stable and Patient moving all extremities  Post vital signs: Reviewed and stable  Last Vitals:  Vitals Value Taken Time  BP 155/88 02/22/21 0845  Temp    Pulse 88 02/22/21 0846  Resp 23 02/22/21 0846  SpO2 100 % 02/22/21 0846  Vitals shown include unvalidated device data.  Last Pain:  Vitals:   02/22/21 0617  TempSrc:   PainSc: 8       Patients Stated Pain Goal: 3 (02/22/21 0617)  Complications: No notable events documented.

## 2021-02-22 NOTE — H&P (Signed)
Addendum H&P: Patient was recently admitted due to excessive alcohol use.  He has subsequently been discharged with appropriate outpatient follow-up.  Patient presents today for a T12 kyphoplasty.  Patient has been cleared medically and we will move forward with surgery.  His orthopedic clinical exam is unchanged from his last office visit.  I have also reviewed his last progress note from 02/19/2021 provided by Dr. Denton Lank and there has been no change.  I have gone over the risks, benefits and alternatives to surgery with him in great detail and all of his questions were addressed.

## 2021-02-22 NOTE — Progress Notes (Signed)
Patient has home CPAP at bedside. RT plugged CPAP into red outlet. Patient is able to place mask on without assistance. RT instructed pt to have RT called if needed. RT will monitor as needed.

## 2021-02-23 ENCOUNTER — Encounter (HOSPITAL_COMMUNITY): Payer: Self-pay | Admitting: Orthopedic Surgery

## 2021-02-23 DIAGNOSIS — M8088XA Other osteoporosis with current pathological fracture, vertebra(e), initial encounter for fracture: Secondary | ICD-10-CM | POA: Diagnosis not present

## 2021-02-23 LAB — GLUCOSE, CAPILLARY: Glucose-Capillary: 130 mg/dL — ABNORMAL HIGH (ref 70–99)

## 2021-02-23 NOTE — Telephone Encounter (Signed)
I spoke with the pt to give him the message below. Pt is about to be d/c from [facility] he requests that we give him a call back.

## 2021-02-23 NOTE — Evaluation (Signed)
Occupational Therapy Evaluation Patient Details Name: John [name] MRN: [id number] DOB: 10/22/1960 Today's Date: 02/23/2021    History of Present Illness Pt is a 60 y/o male who presents with a T12 compression fracture, now s/p T12 kyphoplasty on 02/22/2021. PMH significant for AICD, CHF, nonischemic cardiomyopathy EF 20-25%, OA, L THA, B TSA.   Clinical Impression   Patient evaluated by Occupational Therapy with no further acute OT needs identified. All education has been completed and the patient has no further questions. See below for any follow-up Occupational Therapy or equipment needs. OT to sign off. Thank you for referral.      Follow Up Recommendations  No OT follow up    Equipment Recommendations  None recommended by OT    Recommendations for Other Services       Precautions / Restrictions Precautions Precautions: Fall;Back Precaution Booklet Issued: Yes (comment) Precaution Comments: reviewed back precautions for adls Spinal Brace:  ("No brace needed" order) Restrictions Weight Bearing Restrictions: No      Mobility Bed Mobility Overal bed mobility: Modified Independent             General bed mobility comments: sitting eob on arrival    Transfers Overall transfer level: Modified independent               General transfer comment: VC's for optimal posture with power-up to full stand.    Balance Overall balance assessment: Mild deficits observed, not formally tested                                         ADL either performed or assessed with clinical judgement   ADL Overall ADL's : Needs assistance/impaired Eating/Feeding: Independent   Grooming: Wash/dry hands   Upper Body Bathing: Independent   Lower Body Bathing: Minimal assistance       Lower Body Dressing: Minimal assistance;Sit to/from stand Lower Body Dressing Details (indicate cue type and reason): educated on use of reacher and will have girlfriend (A) if  needed Toilet Transfer: Supervision/safety    Back handout provided and reviewed adls in detail. Pt educated on:  set an alarm at night for medication, avoid sitting for long periods of time, correct bed positioning for sleeping, correct sequence for bed mobility, avoiding lifting more than 5 pounds and never wash directly over incision. All education is complete and patient indicates understanding.        Functional mobility during ADLs: Supervision/safety       Vision Baseline Vision/History: No visual deficits       Perception     Praxis      Pertinent Vitals/Pain Pain Assessment: Faces Faces Pain Scale: Hurts a little bit Pain Location: Back Pain Descriptors / Indicators: Operative site guarding Pain Intervention(s): Limited activity within patient's tolerance;Premedicated before session;Repositioned     Hand Dominance Right   Extremity/Trunk Assessment Upper Extremity Assessment Upper Extremity Assessment: Overall WFL for tasks assessed (educated not to reach above head)   Lower Extremity Assessment Lower Extremity Assessment: Defer to PT evaluation   Cervical / Trunk Assessment Cervical / Trunk Assessment: Other exceptions Cervical / Trunk Exceptions: s/p back surgery with pending R hip replacement in 4 weeks   Communication Communication Communication: No difficulties   Cognition Arousal/Alertness: Awake/alert Behavior During Therapy: Anxious Overall Cognitive Status: Within Functional Limits for tasks assessed  General Comments: Anxious to discharge, rushing through therapy session.   General Comments       Exercises     Shoulder Instructions      Home Living Family/patient expects to be discharged to:: Private residence Living Arrangements: Spouse/significant other Available Help at Discharge: Available 24 hours/day Type of Home: House Home Access: Stairs to enter [employer] of Steps: 2    Home Layout: Two level Alternate Level Stairs-Number of Steps: 2   Bathroom Shower/Tub: Tub/shower unit;Walk-in shower   Bathroom Toilet: Standard     Home Equipment: Cane - single point;Bedside commode   Additional Comments[place] in when he can go to the pool. pt self verbalized no pool for 4 weeks but wants to sit at the pool to socialize      Prior Functioning/Environment Level of Independence: Needs assistance  Gait / Transfers Assistance Needed: Using a cane intermittently PTA, and states he was not doing much activity. Pt reports eating meals in bed/recliner so he would not have to get up and sit at the table. ADL's / Homemaking Assistance Needed: Pt reports girlfriend was washing feet/lower legs, donning shoes and socks, making meals PTA   Comments: girlfriend works from home / reports driving still at this time. pt advised to house hold ambulation for 2 weeks for follow up then progress to as MD recommendations        OT Problem List:        OT Treatment/Interventions:      OT Goals(Current goals can be found in the care plan section) Acute Rehab OT Goals Patient Stated Goal: to be able to go to the pool/ get a spray tan at some point. Delay start of session to finish [employer] show OT Goal Formulation: With patient  OT Frequency:     Barriers to D/C:            Co-evaluation              AM-PAC OT "6 Clicks" Daily Activity     Outcome Measure Help from another person eating meals?: None Help from another person taking care of personal grooming?: None Help from another person toileting, which includes using toliet, bedpan, or urinal?: None Help from another person bathing (including washing, rinsing, drying)?: A Little Help from another person to put on and taking off regular upper body clothing?: A Little Help from another person to put on and taking off regular lower body clothing?: A Little 6 Click Score: 21    End of Session Nurse Communication: Mobility status;Precautions  Activity Tolerance: Patient tolerated treatment well Patient left: in chair;with call bell/phone within reach  OT Visit Diagnosis: Unsteadiness on feet (R26.81)                Time: 8182-9937 OT Time Calculation (min): 34 min Charges:  OT General Charges $OT Visit: 1 Visit OT Evaluation $OT Eval Moderate Complexity: 1 Mod OT Treatments $Self Care/Home Management : 8-22 mins   John [name], OTR/L  Acute Rehabilitation Services Pager: [phone] Office: [phone] .   Mateo Flow 02/23/2021, 10:37 AM

## 2021-02-23 NOTE — Evaluation (Signed)
Physical Therapy Evaluation and Discharge Patient Details Name: John [name] MRN: [id number] DOB: [birth year] Today's Date: 02/23/2021   History of Present Illness  Pt is a 60 y/o male who presents with a T12 compression fracture, now s/p T12 kyphoplasty on 02/22/2021. PMH significant for AICD, CHF, nonischemic cardiomyopathy EF 20-25%, OA, L THA, B TSA.   Clinical Impression  Patient evaluated by Physical Therapy with no further acute PT needs identified. All education has been completed and the patient has no further questions. Pt was able to demonstrate transfers and ambulation with gross modified independence and no AD. Pt appears guarded and mildly unsteady at times but overall safe with functional mobility. Noted pt with 3/4 DOE by end of gait training. Pt was educated on precautions, appropriate activity progression, and car transfer. See below for any follow-up Physical Therapy or equipment needs. PT is signing off. Thank you for this referral.     Follow Up Recommendations No PT follow up;Supervision for mobility/OOB    Equipment Recommendations  None recommended by PT    Recommendations for Other Services       Precautions / Restrictions Precautions Precautions: Fall;Back Precaution Booklet Issued: Yes (comment) Precaution Comments: Reviewed handout and pt was cued for precautions during functional mobility. Spinal Brace:  ("No brace needed" order) Restrictions Weight Bearing Restrictions: No      Mobility  Bed Mobility Overal bed mobility: Modified Independent             General bed mobility comments: HOB flat and rails lowered to simulate home environment.    Transfers Overall transfer level: Modified independent               General transfer comment: VC's for optimal posture with power-up to full stand.  Ambulation/Gait Ambulation/Gait assistance: Supervision;Modified independent (Device/Increase time) Gait Distance (Feet): 400 Feet Assistive  device: None Gait Pattern/deviations: Step-through pattern;Decreased stride length;Trunk flexed Gait velocity: Decreased Gait velocity interpretation: 1.31 - 2.62 ft/sec, indicative of limited community ambulator General Gait Details: Mildly decreased gait speed with DOE 3/4 noted by end of ambulation. VC's for improved posture. Mod I by end of session.  Stairs Stairs:  (Pt declined stair training.)          Wheelchair Mobility    Modified Rankin (Stroke Patients Only)       Balance Overall balance assessment: Mild deficits observed, not formally tested                                           Pertinent Vitals/Pain Pain Assessment: Faces Faces Pain Scale: Hurts a little bit Pain Location: Back Pain Descriptors / Indicators: Operative site guarding Pain Intervention(s): Limited activity within patient's tolerance;Monitored during session;Repositioned    Home Living Family/patient expects to be discharged to:: Private residence Living Arrangements: Spouse/significant other Available Help at Discharge: Available 24 hours/day (girlfriend) Type of Home: House Home Access: Stairs to enter   [employer] of Steps: 2 Home Layout: Two level Home Equipment: Cane - single point;Bedside commode      Prior Function Level of Independence: Needs assistance   Gait / Transfers Assistance Needed: Using a cane intermittently PTA, and states he was not doing much activity. Pt reports eating meals in bed/recliner so he would not have to get up and sit at the table.  ADL's / Homemaking Assistance Needed: Pt reports girlfriend was washing feet/lower legs, donning shoes and  socks, making meals PTA        Hand Dominance   Dominant Hand: Right    Extremity/Trunk Assessment   Upper Extremity Assessment Upper Extremity Assessment: Defer to OT evaluation    Lower Extremity Assessment Lower Extremity Assessment: Generalized weakness (Consistent with  pre-op diagnosis, decreased mobility PTA, and baseline hip pain)    Cervical / Trunk Assessment Cervical / Trunk Assessment: Other exceptions Cervical / Trunk Exceptions: s/p surgery  Communication   Communication: No difficulties  Cognition Arousal/Alertness: Awake/alert Behavior During Therapy: Anxious Overall Cognitive Status: Within Functional Limits for tasks assessed                                 General Comments: Anxious to discharge, rushing through therapy session.      General Comments      Exercises     Assessment/Plan    PT Assessment Patent does not need any further PT services  PT Problem List         PT Treatment Interventions      PT Goals (Current goals can be found in the Care Plan section)  Acute Rehab PT Goals Patient Stated Goal: D/C by 10am, no later. PT Goal Formulation: All assessment and education complete, DC therapy    Frequency     Barriers to discharge        Co-evaluation               AM-PAC PT "6 Clicks" Mobility  Outcome Measure Help needed turning from your back to your side while in a flat bed without using bedrails?: None Help needed moving from lying on your back to sitting on the side of a flat bed without using bedrails?: None Help needed moving to and from a bed to a chair (including a wheelchair)?: None Help needed standing up from a chair using your arms (e.g., wheelchair or bedside chair)?: None Help needed to walk in hospital room?: None Help needed climbing 3-5 steps with a railing? : A Little 6 Click Score: 23    End of Session   Activity Tolerance: Patient tolerated treatment well Patient left: in bed;with call bell/phone within reach Nurse Communication: Mobility status PT Visit Diagnosis: Unsteadiness on feet (R26.81);Pain Pain - part of body:  (back)    Time: 2263-3354 PT Time Calculation (min) (ACUTE ONLY): 14 min   Charges:   PT Evaluation $PT Eval Low Complexity: 1 Low           Conni Slipper, PT, DPT Acute Rehabilitation Services Pager: [phone] Office: [phone]   Marylynn Pearson 02/23/2021, 9:46 AM

## 2021-02-23 NOTE — Telephone Encounter (Signed)
I spoke with this pt to give message from Dr. Durene Cal. He is very rude and continues to talk down on the clinic, saying it shouldn't take 1 week to refill this medication. He spoke about establishing care outside of [facility].  He states that he will have to get this medication from his neighbors. He says that he drinks beer to go to sleep. He continues to "bash" the facility. He was told that Dr. Durene Cal is not I the office today, but the requests will be sent.

## 2021-02-23 NOTE — Progress Notes (Signed)
    Subjective: Procedure(s) (LRB): KYPHOPLASTY T12 (N/A) 1 Day Post-Op  Patient reports pain as 0 on 0-10 scale.  Reports none leg pain reports incisional back pain   Positive void Negative bowel movement Positive flatus Negative chest pain or shortness of breath  Objective: Vital signs in last 24 hours: Temp:  [97.9 F (36.6 C)-98.6 F (37 C)] 98.2 F (36.8 C) (06/10 0728) Pulse Rate:  [71-92] 76 (06/10 0728) Resp:  [15-25] 18 (06/10 0728) BP: (131-160)/(77-99) 136/88 (06/10 0728) SpO2:  [95 %-100 %] 98 % (06/10 0728)  Intake/Output from previous day: 06/09 0701 - 06/10 0700 In: 800 [I.V.:700; IV Piggyback:100] Out: -   Labs: No results for input(s): WBC, RBC, HCT, PLT in the last 72 hours. No results for input(s): NA, K, CL, CO2, BUN, CREATININE, GLUCOSE, CALCIUM in the last 72 hours. Recent Labs    02/22/21 0559  INR 1.0    Physical Exam: Neurologically intact ABD soft Intact pulses distally Incision: dressing C/D/I Compartment soft Body mass index is 36.26 kg/m.   Assessment/Plan: Patient stable  xrays N/A  Continue mobilization with physical therapy Continue care  Advance diet Patient doing well No SOB or pulmonary issues overnight Plan on d/c to home today - f/u in 2 weeks  Venita Lick, MD [facility] [phone]

## 2021-02-23 NOTE — Discharge Summary (Signed)
Patient ID: John [name] MRN: [id number] DOB/AGE: May 24, 1961 60 y.o.  Admit date: 02/22/2021 Discharge date: 02/23/2021  Admission Diagnoses:  Active Problems:   Thoracic compression fracture [facility])   Discharge Diagnoses:  Active Problems:   Thoracic compression fracture (HCC)  status post Procedure(s): KYPHOPLASTY T12  Past Medical History:  Diagnosis Date   AICD (automatic cardioverter/defibrillator) present    [employer]   Anxiety    Ativan   Chronic combined systolic and diastolic CHF, NYHA class 3 (HCC) 10/2016   Nonischemic cardiomyopathy. EF 20-25%.   Chronic left hip pain    Depression    history of   Dysrhythmia    A-FIB   GI bleed 03/2016   Headache    Hypertensive heart disease with combined systolic and diastolic congestive heart failure (HCC) 10/2016   Nonischemic cardiomyopathy (HCC) 10/2016   Echo with EF 20-25%. Cardiac catheterization with no CAD. LVEDP was 41 mmHg, PCWP 36 mmHg   Osteoarthritis    right shoulder   Right hand fracture    Seasonal allergies    Sleep apnea    wears a CPAP   Wears glasses     Surgeries: Procedure(s): KYPHOPLASTY T12 on 02/22/2021   Consultants:   Discharged Condition: Improved  Hospital Course: Ashok Sawaya is an 60 y.o. male who was admitted 02/22/2021 for operative treatment of T12 compression fracture. Patient failed conservative treatments (please see the history and physical for the specifics) and had severe unremitting pain that affects sleep, daily activities and work/hobbies. After pre-op clearance, the patient was taken to the operating room on 02/22/2021 and underwent  Procedure(s): KYPHOPLASTY T12.    Patient was given perioperative antibiotics:  Anti-infectives (From admission, onward)    Start     Dose/Rate Route Frequency Ordered Stop   02/22/21 1530  ceFAZolin (ANCEF) IVPB 2g/100 mL premix        2 g 200 mL/hr over 30 Minutes Intravenous Every 8 hours 02/22/21 1018 02/22/21 2233   02/22/21 0558   ceFAZolin (ANCEF) IVPB 3g/100 mL premix        3 g 200 mL/hr over 30 Minutes Intravenous 30 min pre-op 02/22/21 0558 02/22/21 0732        Patient was given sequential compression devices and early ambulation to prevent DVT.   Patient benefited maximally from hospital stay and there were no complications. At the time of discharge, the patient was urinating/moving their bowels without difficulty, tolerating a regular diet, pain is controlled with oral pain medications and they have been cleared by PT/OT.   Recent vital signs: Patient Vitals for the past 24 hrs:  BP Temp Temp src Pulse Resp SpO2  02/23/21 0728 136/88 98.2 F (36.8 C) Oral 76 18 98 %  02/23/21 0445 (!) 160/97 98.2 F (36.8 C) Oral 84 20 97 %  02/22/21 2308 131/78 98.4 F (36.9 C) Oral 71 20 98 %  02/22/21 2002 -- -- -- 75 16 99 %  02/22/21 1948 (!) 141/85 98.4 F (36.9 C) Oral 73 20 98 %  02/22/21 1617 135/77 98.6 F (37 C) Oral 92 18 95 %  02/22/21 1208 (!) 155/91 98.3 F (36.8 C) Oral 75 18 97 %     Recent laboratory studies:  Recent Labs    02/22/21 0559  INR 1.0     Discharge Medications:   Allergies as of 02/23/2021   No Known Allergies      Medication List     STOP taking these medications    lidocaine-prilocaine  cream Commonly known as: EMLA   multivitamin with minerals tablet   oxyCODONE-acetaminophen 5-325 MG tablet Commonly known as: PERCOCET/ROXICET       TAKE these medications    acetaminophen 650 MG CR tablet Commonly known as: TYLENOL Take 650 mg by mouth every 8 (eight) hours as needed for pain.   escitalopram 10 MG tablet Commonly known as: LEXAPRO Take 1 tablet by mouth daily.   Farxiga 10 MG Tabs tablet Generic drug: dapagliflozin propanediol TAKE 1 TABLET BY MOUTH DAILY BEFORE BREAKFAST   ferrous sulfate 325 (65 FE) MG tablet Take 1 tablet (325 mg total) by mouth daily with breakfast.   LORazepam 1 MG tablet Commonly known as: ATIVAN Take 1 tablet (1 mg total)  by mouth daily as needed for anxiety (Stopping ambien. no hydrocodone within 8 hours. no driving within 8 hours.). What changed: when to take this   ondansetron 4 MG tablet Commonly known as: Zofran Take 1 tablet (4 mg total) by mouth every 8 (eight) hours as needed for up to 5 days for nausea or vomiting.   rosuvastatin 20 MG tablet Commonly known as: CRESTOR TAKE 1 TABLET BY MOUTH EVERY DAY   sildenafil 100 MG tablet Commonly known as: VIAGRA TAKE 0.5 TABLETS (50 MG TOTAL) BY MOUTH AT BEDTIME AS NEEDED FOR ERECTILE DYSFUNCTION.   spironolactone 25 MG tablet Commonly known as: ALDACTONE TAKE 1 TABLET BY MOUTH EVERY DAY   torsemide 20 MG tablet Commonly known as: DEMADEX Take 3 tablets (60 mg total) by mouth daily.   traMADol 50 MG tablet Commonly known as: Ultram Take 1 tablet (50 mg total) by mouth every 6 (six) hours as needed for up to 5 days.   traZODone 50 MG tablet Commonly known as: DESYREL Take 1 tablet (50 mg total) by mouth at bedtime as needed.        Diagnostic Studies: DG Chest 2 View  Result Date: 02/15/2021 CLINICAL DATA:  Preop back surgery EXAM: CHEST - 2 VIEW COMPARISON:  08/23/2020 FINDINGS: Bilateral shoulder replacements. Left-sided single lead pacing device as before. Linear scarring or atelectasis at the bases. No acute airspace disease, pleural effusion, or pneumothorax. Normal cardiomediastinal silhouette. Degenerative changes of the spine IMPRESSION: No active cardiopulmonary disease. Minimal scarring or atelectasis at the bases Electronically Signed   By: Jasmine Pang M.D.   On: 02/15/2021 21:41   DG THORACOLUMABAR SPINE  Result Date: 02/22/2021 CLINICAL DATA:  T12 kyphoplasty. EXAM: DG C-ARM 1-60 MIN; THORACOLUMBAR SPINE - 2 VIEW FLUOROSCOPY TIME:  Fluoroscopy Time:  1 minutes 28 seconds. Radiation Exposure Index (if provided by the fluoroscopic device): 63.47 mGy Number of Acquired Spot Images: 3 COMPARISON:  CT February 16, 2021. FINDINGS: Three  different C-arm fluoroscopic images were obtained intraoperatively and submitted for post operative interpretation. These images demonstrate T12 kyphoplasty without appreciable cement extravasation. No unexpected findings. Similar vertebral body height loss. Please see the performing provider's procedural report for further detail. IMPRESSION: T12 kyphoplasty. Electronically Signed   By: Feliberto Harts MD   On: 02/22/2021 13:00   CT Head Wo Contrast  Result Date: 02/16/2021 CLINICAL DATA:  Fall last week.  Bruising around eyes. EXAM: CT HEAD WITHOUT CONTRAST TECHNIQUE: Contiguous axial images were obtained from the base of the skull through the vertex without intravenous contrast. COMPARISON:  None. FINDINGS: Brain: No acute intracranial abnormality. Specifically, no hemorrhage, hydrocephalus, mass lesion, acute infarction, or significant intracranial injury. Vascular: No hyperdense vessel or unexpected calcification. Skull: No acute calvarial abnormality. Sinuses/Orbits: Orbital  soft tissues unremarkable. No visible orbital fracture. Paranasal sinuses clear. Other: None IMPRESSION: No acute intracranial abnormality. Electronically Signed   By: Charlett Nose M.D.   On: 02/16/2021 17:40   DG C-Arm 1-60 Min  Result Date: 02/22/2021 CLINICAL DATA:  T12 kyphoplasty. EXAM: DG C-ARM 1-60 MIN; THORACOLUMBAR SPINE - 2 VIEW FLUOROSCOPY TIME:  Fluoroscopy Time:  1 minutes 28 seconds. Radiation Exposure Index (if provided by the fluoroscopic device): 63.47 mGy Number of Acquired Spot Images: 3 COMPARISON:  CT February 16, 2021. FINDINGS: Three different C-arm fluoroscopic images were obtained intraoperatively and submitted for post operative interpretation. These images demonstrate T12 kyphoplasty without appreciable cement extravasation. No unexpected findings. Similar vertebral body height loss. Please see the performing provider's procedural report for further detail. IMPRESSION: T12 kyphoplasty. Electronically Signed    By: Feliberto Harts MD   On: 02/22/2021 13:00   CUP PACEART REMOTE DEVICE CHECK  Result Date: 02/01/2021 Scheduled remote reviewed. Normal device function.  RP Next remote 91 days.  CT CHEST ABDOMEN PELVIS WO CONTRAST  Result Date: 02/16/2021 CLINICAL DATA:  Fall last week, right rib pain.  Low hemoglobin. EXAM: CT CHEST, ABDOMEN AND PELVIS WITHOUT CONTRAST TECHNIQUE: Multidetector CT imaging of the chest, abdomen and pelvis was performed following the standard protocol without IV contrast. COMPARISON:  None. FINDINGS: CT CHEST FINDINGS Cardiovascular: Left side pacer in place with lead in the right ventricle. Heart is normal size. Coronary artery and aortic calcifications. No aneurysm. Mediastinum/Nodes: No mediastinal, hilar, or axillary adenopathy. Trachea and esophagus are unremarkable. Thyroid unremarkable. Lungs/Pleura: Minimal right base and dependent atelectasis. No confluent opacities or effusions. Musculoskeletal: Multiple old bilateral rib fractures. Acute right rib fractures in the anterolateral 6 through 9th right ribs. No pneumothorax. CT ABDOMEN PELVIS FINDINGS Hepatobiliary: No hepatic injury or perihepatic hematoma. Gallbladder is unremarkable Pancreas: No focal abnormality or ductal dilatation. Spleen: No splenic injury or perisplenic hematoma. Adrenals/Urinary Tract: No adrenal hemorrhage or renal injury identified. Bladder is unremarkable. Stomach/Bowel: Stomach, large and small bowel grossly unremarkable. Vascular/Lymphatic: Aortic atherosclerosis. No evidence of aneurysm or adenopathy. Reproductive: No visible focal abnormality. Other: No free fluid or free air. Musculoskeletal: Prior left hip replacement. No acute bony abnormality. IMPRESSION: Acute fractures through the right anterolateral 6th through 9th ribs. Multiple old bilateral rib fractures. Minimal right base and dependent atelectasis. No pneumothorax. Coronary artery disease, aortic atherosclerosis. No acute findings in the  abdomen or pelvis. Electronically Signed   By: Charlett Nose M.D.   On: 02/16/2021 17:45    Discharge Instructions     Incentive spirometry RT   Complete by: As directed         Follow-up Information     Venita Lick, MD. Go on 03/09/2021.   Specialty: Orthopedic Surgery Contact information: [address] [place] [address] [phone]                 Discharge Plan:  discharge to home  Disposition: stable    Signed: Rhodia Albright for [facility] PA-C [facility] [phone] 02/23/2021, 11:14 AM

## 2021-02-26 NOTE — Telephone Encounter (Signed)
Patient is calling again to make sure medication    traZODone (DESYREL) 50 MG tablet LORazepam (ATIVAN) 1 MG tablet  gets refilled to  [id number] [name], [place] - [address]

## 2021-03-01 ENCOUNTER — Other Ambulatory Visit: Payer: Self-pay | Admitting: Family Medicine

## 2021-03-01 MED ORDER — LORAZEPAM 0.5 MG PO TABS: ORAL_TABLET | ORAL | 0 refills | Status: DC

## 2021-03-13 ENCOUNTER — Other Ambulatory Visit: Payer: Self-pay | Admitting: Family Medicine

## 2021-03-13 MED ORDER — LORAZEPAM 0.5 MG PO TABS: 0.5000 mg | ORAL_TABLET | Freq: Every day | ORAL | 0 refills | Status: DC | PRN

## 2021-03-14 NOTE — Telephone Encounter (Signed)
Caller is waiting for a call back from the office manager. Caller is not experiencing symptoms

## 2021-03-22 ENCOUNTER — Other Ambulatory Visit (HOSPITAL_COMMUNITY): Payer: Self-pay | Admitting: Cardiology

## 2021-03-22 ENCOUNTER — Encounter: Payer: Self-pay | Admitting: Family Medicine

## 2021-03-22 ENCOUNTER — Other Ambulatory Visit: Payer: Self-pay

## 2021-03-22 ENCOUNTER — Ambulatory Visit (INDEPENDENT_AMBULATORY_CARE_PROVIDER_SITE_OTHER): Payer: 59 | Admitting: Family Medicine

## 2021-03-22 VITALS — BP 130/72 | HR 89 | Temp 98.0°F | Ht 70.0 in | Wt 266.8 lb

## 2021-03-22 DIAGNOSIS — I10 Essential (primary) hypertension: Secondary | ICD-10-CM | POA: Diagnosis not present

## 2021-03-22 DIAGNOSIS — R21 Rash and other nonspecific skin eruption: Secondary | ICD-10-CM

## 2021-03-22 DIAGNOSIS — M25551 Pain in right hip: Secondary | ICD-10-CM

## 2021-03-22 DIAGNOSIS — I700 Atherosclerosis of aorta: Secondary | ICD-10-CM | POA: Diagnosis not present

## 2021-03-22 LAB — CBC WITH DIFFERENTIAL/PLATELET
Basophils Absolute: 0.1 10*3/uL (ref 0.0–0.1)
Basophils Relative: 0.7 % (ref 0.0–3.0)
Eosinophils Absolute: 0.3 10*3/uL (ref 0.0–0.7)
Eosinophils Relative: 4.5 % (ref 0.0–5.0)
HCT: 37.6 % — ABNORMAL LOW (ref 39.0–52.0)
Hemoglobin: 12.4 g/dL — ABNORMAL LOW (ref 13.0–17.0)
Lymphocytes Relative: 15.3 % (ref 12.0–46.0)
Lymphs Abs: 1.1 10*3/uL (ref 0.7–4.0)
MCHC: 33 g/dL (ref 30.0–36.0)
MCV: 97.8 fl (ref 78.0–100.0)
Monocytes Absolute: 0.9 10*3/uL (ref 0.1–1.0)
Monocytes Relative: 12.1 % — ABNORMAL HIGH (ref 3.0–12.0)
Neutro Abs: 5 10*3/uL (ref 1.4–7.7)
Neutrophils Relative %: 67.4 % (ref 43.0–77.0)
Platelets: 289 10*3/uL (ref 150.0–400.0)
RBC: 3.84 Mil/uL — ABNORMAL LOW (ref 4.22–5.81)
RDW: 16.1 % — ABNORMAL HIGH (ref 11.5–15.5)
WBC: 7.4 10*3/uL (ref 4.0–10.5)

## 2021-03-22 LAB — COMPREHENSIVE METABOLIC PANEL
ALT: 17 U/L (ref 0–53)
AST: 21 U/L (ref 0–37)
Albumin: 4.2 g/dL (ref 3.5–5.2)
Alkaline Phosphatase: 68 U/L (ref 39–117)
BUN: 11 mg/dL (ref 6–23)
CO2: 28 mEq/L (ref 19–32)
Calcium: 9.7 mg/dL (ref 8.4–10.5)
Chloride: 99 mEq/L (ref 96–112)
Creatinine, Ser: 0.99 mg/dL (ref 0.40–1.50)
GFR: 83.22 mL/min (ref >60.00)
Glucose, Bld: 114 mg/dL — ABNORMAL HIGH (ref 70–99)
Potassium: 3.8 mEq/L (ref 3.5–5.1)
Sodium: 136 mEq/L (ref 135–145)
Total Bilirubin: 0.4 mg/dL (ref 0.2–1.2)
Total Protein: 7.1 g/dL (ref 6.0–8.3)

## 2021-03-22 MED ORDER — METHYLPREDNISOLONE ACETATE 40 MG/ML IJ SUSP
40.0000 mg | Freq: Once | INTRAMUSCULAR | Status: AC
  Administered 2021-03-22: 40 mg via INTRAMUSCULAR

## 2021-03-22 MED ORDER — TRAZODONE HCL 50 MG PO TABS: 50.0000 mg | ORAL_TABLET | Freq: Every evening | ORAL | 5 refills | Status: DC | PRN

## 2021-03-22 MED ORDER — ESCITALOPRAM OXALATE 10 MG PO TABS: 10.0000 mg | ORAL_TABLET | Freq: Every day | ORAL | 2 refills | Status: DC

## 2021-03-22 NOTE — Progress Notes (Signed)
Phone [phone] In person visit   Subjective:   John [name] is a 60 y.o. year old very pleasant male patient who presents for/with See problem oriented charting Chief Complaint  Patient presents with   Depression   Anxiety   Hives    Patient thinks it's related to the anxiety    This visit occurred during the SARS-CoV-2 public health emergency.  Safety protocols were in place, including screening questions prior to the visit, additional usage of staff PPE, and extensive cleaning of exam room while observing appropriate contact time as indicated for disinfecting solutions.   Past Medical History-  Patient Active Problem List   Diagnosis Date Noted   Paroxysmal atrial fibrillation ([place]) 04/14/2020    Priority: High   S/P ICD (internal cardiac defibrillator) procedure 04/14/2020    Priority: High   Status post reverse total shoulder replacement, left 11/11/2019    Priority: High   Non-ischemic cardiomyopathy ([place])     Priority: High   Chronic combined systolic and diastolic CHF, NYHA class 3 ([place]) 10/2016    Priority: High   Alcohol dependence in remission ([place]) 03/19/2016    Priority: High   Aortic atherosclerosis ([place]) 03/22/2021    Priority: Medium   GAD (generalized anxiety disorder) 05/31/2020    Priority: Medium   Hyperlipidemia 05/27/2018    Priority: Medium   Essential hypertension 11/08/2016    Priority: Medium   GI bleed 03/19/2016    Priority: Medium   ED (erectile dysfunction) 08/21/2015    Priority: Medium   Hyperglycemia 08/21/2015    Priority: Medium   Insomnia 08/21/2015    Priority: Medium   Sleep apnea 08/21/2015    Priority: Medium   Unspecified fracture of upper end of left humerus, initial encounter for closed fracture 09/03/2017    Priority: Low   S/P shoulder replacement 11/24/2015    Priority: Low   Allergic rhinitis 08/21/2015    Priority: Low   Degenerative joint disease of left hip 03/27/2015    Priority: Low   Thoracic  compression fracture ([place]) 02/22/2021   Frequent falls 02/16/2021   Hyponatremia 02/16/2021   Acute anemia 02/16/2021   Hypotension 02/16/2021   ETOH abuse 02/16/2021    Medications- reviewed and updated Current Outpatient Medications  Medication Sig Dispense Refill   acetaminophen (TYLENOL) 650 MG CR tablet Take 650 mg by mouth every 8 (eight) hours as needed for pain.     FARXIGA 10 MG TABS tablet TAKE 1 TABLET BY MOUTH DAILY BEFORE BREAKFAST 30 tablet 11   ferrous sulfate 325 (65 FE) MG tablet Take 1 tablet (325 mg total) by mouth daily with breakfast. 30 tablet 3   rosuvastatin (CRESTOR) 20 MG tablet TAKE 1 TABLET BY MOUTH EVERY DAY 30 tablet 11   sildenafil (VIAGRA) 100 MG tablet TAKE 0.5 TABLETS (50 MG TOTAL) BY MOUTH AT BEDTIME AS NEEDED FOR ERECTILE DYSFUNCTION. 10 tablet 3   spironolactone (ALDACTONE) 25 MG tablet TAKE 1 TABLET BY MOUTH EVERY DAY 30 tablet 11   torsemide (DEMADEX) 20 MG tablet Take 3 tablets (60 mg total) by mouth daily. 90 tablet 3   escitalopram (LEXAPRO) 10 MG tablet Take 1 tablet (10 mg total) by mouth daily. 90 tablet 2   traZODone (DESYREL) 50 MG tablet Take 1 tablet (50 mg total) by mouth at bedtime as needed. 30 tablet 5   No current facility-administered medications for this visit.     Objective:  BP 130/72   Pulse 89   Temp 98 F (  36.7 C) (Temporal)   Ht 5' 10" (1.778 m)   Wt 266 lb 12.8 oz (121 kg)   SpO2 97%   BMI 38.28 kg/m  Gen: NAD, resting comfortably CV: RRR no murmurs rubs or gallops Lungs: CTAB no crackles, wheeze, rhonchi Skin: warm, dry    Assessment and Plan   # Hospital F/U for Compression fracture Surgery after fall #hospital follow up for macrocytic anemia rather significant S: Patient with prolonged hospitalization 02/16/2021 to 03/07/2021.  Plan prior to admission was for hip replacement but on preoperative labs patient was found to have significant anemia with hemoglobin of 8 down from 14 months prior with concerns for  hypotension and acute anemia.Patient also had sustainedclosed fracture of multiple ribs of right side prior to admission related to a fall-he stated this was related to weakness in his hip-he denies this being related to alcohol intake.  Of note patient had preoperative anemia with hgb 8 down from 14 with FOBT negative- thought macrocytic anemia related to alcohol heavy use.  Did get 2 iron infusions. Hgb up to 9  most recently- which was stated goal at discharge from hospitalizatoin  During his hospital stay on 02/22/2021 he was treated for operative of T12 compression fracture with kyphoplasty.  At time of discharge, he was urinating and bowels were moving with no difficulty. Tolerated a regular diet. Pain was controlled with oral pain medications. He was cleared by PT/OT.  At discharge:He was advised to stop taking lidocaine-prilocaine cream, multivitamin with minerals, and oxycodon-acetaminophen 5-325 mg. He was told to take acetaminophen 650 mg CR, escitalopram 10 mg, Farxiga 10 mg, ferrous sulfate 325 mg, Lorazepam 1 mg, ondansetron 4 mg, rosuvastatin 20 mg, sildenafil 100 mg, spironolacton 25 mg, torsemide 20 mg, tramadol 50 mg,and trazodone 50 mg.  He is recovering well from kyphoplasty (reports back is feeling well) and is trying to get set up for hip procedure. He reports falling related to hip pain and not to alcohol.  A/P: In regards to patient's macrocytic anemia-significant drop from prehospitalization hospitalization down from 14-8-improved to 9 before discharge with iron infusion.  Patient is continuing iron supplementation.  Patient also has had low normal B12 at 225-recommended a B complex with 1000 mcg of B12-this would also help with potential thiamine deficiency  In regards to kyphoplasty patient is healing well.  He is hoping to be able to move forward with his hip surgery-we need to make sure his labs are stable/anemia improving before proceeding with surgery-we will update CBC and  CMP today  # Anxiety/GAD/Insomnia #alcohol abuse S: From discharge summary  "EtOH Abuse - excessive alcohol use recently, pt says helps pain to be tolerable.  Last drink prior to ED day of admission. --Managed by CIWA protocol with PRN Ativan - uneventful course without complications."   Medication: None prior to visit-patient reports running out of lorazepam 0.5 mg 2 weeks ago as he was having to take 2 tablets of the 0.5 mg-based on his refill dates he should have run out yesterday even if he was taking 2 tablets/day -Previously prescribed  trazadone 50 mg, Lexapro 10 mg but he is not taking this for unclear reasons -Patient previously referred December 14, 2020 to psychiatry for GAD, alcohol dependence, insomnia  Patient with multiple concerning habits reported or in chart today 1.  States he drinks alcohol to control his anxiety. States avoid drinking alcohol when on the ativan.  2.  Patient reported he was drinking heavier amounts of alcohol prior to  hospitalization to "treat pain" 3.  Patient reported today that both he and his girlfriend are prescribed Ativan.  He wonders if she took some of his medication.  On the other hand in prior phone call on June 10 patient reported plans to get medication from his neighbors. 4.  Level of alcohol intake prior to kyphoplasty was reported with ranges from 6 on discharge paperwork to 8-12 in the emergency room.  On the other hand some positive trends reported 1.  He is going to AA once a week.  Reports pain with his hip limits him from going more frequently-encouraged virtual group meetings   2.  He did apparently try to establish with a therapist to help control his anxiety-I believe that was Mirna Mires who has retired and he is willing to try another therapist.  Previously on 12/14/2020 report patient reported being very hesitant to see a therapist 3.  Patient remains off Ambien-is willing to try trazodone for sleep and does get a lot of anxiety at  night and wonders if he can sleep well if that will help GAD 7 : Generalized Anxiety Score 03/22/2021 07/14/2020  Nervous, Anxious, on Edge 3 3  Control/stop worrying 3 3  Worry too much - different things 3 1  Trouble relaxing 2 3  Restless 0 1  Easily annoyed or irritable 2 3  Afraid - awful might happen 0 3  Total GAD 7 Score 13 17  Anxiety Difficulty Not difficult at all Somewhat difficult   A/P: Anxiety is poorly controlled-patient is willing to retrial Lexapro 10 mg.  I am also going to try to get him to set up a visit with therapist.  We discussed importance of not using Lexapro with alcohol  For insomnia should remain off Ambien with alcohol abuse history-we will trial trazodone  I am hoping if he has better control of anxiety this may help him with alcohol intake.  Recommended daily AA-attempted virtual visits/sessions. May need psychiatry consult if this medication regimen not effective  # Intense itching S: no new medicine. Started a week ago- patient was concerned could be related to the injection/kyphoplasty. No new contacts like fabric softeners or detergent. Benadryl has been helpful.  Hepatic Function Latest Ref Rng & Units 03/22/2021 02/16/2021 08/24/2020  Total Protein 6.0 - 8.3 g/dL 7.1 6.2(L) 6.6  Albumin 3.5 - 5.2 g/dL 4.2 3.7 -  AST 0 - 37 U/L _0 ALT 0 - 53 U/L _1 Alk Phosphatase 39 - 117 U/L 68 57 -  Total Bilirubin 0.2 - 1.2 mg/dL 0.4 0.3 0.4  Bilirubin, Direct 0.00 - 0.40 mg/dL - - -   A/P: Unclear etiology of diffuse itching-we do need to check LFTs with alcohol abuse history-thankfully these are okay as above.  I wonder if the alcohol intake itself could be triggering this-I have seen this in several patients-recommended alcohol cessation long-term.  He request a steroid injection I think that is reasonable-if no improvement consider referral to dermatology.  I also strongly consider hydroxyzine for anxiety and itching but I am concerned if he starts to  drink alcohol again  Recommended follow up: Return in about 6 weeks (around 05/03/2021) for follow up- or sooner if needed. Future Appointments  Date Time Provider [place]  03/27/2021  2:00 PM [facility] PA/NP [facility] None  05/01/2021  7:00 AM [facility] DEVICE REMOTES [facility]  07/31/2021  7:00 AM [facility] DEVICE REMOTES [facility]  10/30/2021  7:00 AM [facility]  DEVICE REMOTES [facility]  01/29/2022  7:00 AM [facility] DEVICE REMOTES [facility]  04/30/2022  7:00 AM [facility] DEVICE REMOTES [facility]  07/30/2022  7:00 AM [facility] DEVICE REMOTES [facility]    Lab/Order associations:   ICD-10-CM   1. Essential hypertension  I10 CBC with Differential/Platelet    Comprehensive metabolic panel    2. Pain in right hip  M25.551     3. Rash  R21 methylPREDNISolone acetate (DEPO-MEDROL) injection 40 mg    4. Aortic atherosclerosis (HCC)  I70.0       Meds ordered this encounter  Medications   traZODone (DESYREL) 50 MG tablet    Sig: Take 1 tablet (50 mg total) by mouth at bedtime as needed.    Dispense:  30 tablet    Refill:  5   escitalopram (LEXAPRO) 10 MG tablet    Sig: Take 1 tablet (10 mg total) by mouth daily.    Dispense:  90 tablet    Refill:  2   methylPREDNISolone acetate (DEPO-MEDROL) injection 40 mg   I,Harris Phan,acting as a scribe for Garret Reddish, MD.,have documented all relevant documentation on the behalf of Garret Reddish, MD,as directed by  Garret Reddish, MD while in the presence of Garret Reddish, MD.  I, Garret Reddish, MD, have reviewed all documentation for this visit. The documentation on 03/22/21 for the exam, diagnosis, procedures, and orders are all accurate and complete.  Time Spent: 58 minutes of total time (12:05 PM- 12:40 PM, 7:53 PM- 8:16 PM) was spent on the date of the encounter performing the following actions: chart review prior to seeing the  patient, obtaining history, performing a medically necessary exam, counseling on the treatment plan and particularly my concerns about poorly controlled anxiety/alcohol intake/long term risks/importance of avoiding ativan, placing orders, and documenting in our EHR.    Return precautions advised.  Garret Reddish, MD

## 2021-03-22 NOTE — Patient Instructions (Addendum)
Health Maintenance Due  Topic Date Due   COVID-19 Vaccine (4 - Booster for [employer] series) Patient will get this scheduled for the fall.  09/18/2020   Depo medrol injection today 40 mg IM  Take lexapro 10 mg daily for anxiety. Refilled trazodone for sleep- sleeping better may help with anxiety. No alcohol from this point forward  Please call [phone] to schedule a visit with [facility]  Encourage AA daily- look into virtual options given trouble traveling due to your hip  Taking the medicine as directed and not missing any doses is one of the best things you can do to treat your anxiety with lexapro/escitalopram.  Here are some things to keep in mind:  Start cyanocobalamin/vitamin 1000 mcg b12 daily #90 with 3 refills or could take a b complex vitamin - b complex is a great idea just make sure it has at least 1000 mcg of b12 in it  Side effects (stomach upset, some increased anxiety) may happen before you notice a benefit.  These side effects typically go away over time. Changes to your dose of medicine or a change in medication all together is sometimes necessary Most people need to be on medication at least 6-12 months Many people will notice an improvement within two weeks but the full effect of the medication can take up to 4-8 weeks Stopping the medication when you start feeling better often results in a return of symptoms If you start having thoughts of hurting yourself or others after starting this medicine, call our office immediately at [phone] or seek care through 911.    Recommended follow up: Return in about 6 weeks (around 05/03/2021) for follow up- or sooner if needed.

## 2021-03-23 ENCOUNTER — Other Ambulatory Visit: Payer: Self-pay | Admitting: Family Medicine

## 2021-03-23 ENCOUNTER — Telehealth: Payer: Self-pay

## 2021-03-23 NOTE — Telephone Encounter (Signed)
Patient called and stated that his pharmacy does not have the Lexapro order. Can it be resent to to [name], [place] - [address]

## 2021-03-23 NOTE — Telephone Encounter (Signed)
[facility]

## 2021-03-26 ENCOUNTER — Telehealth: Payer: Self-pay

## 2021-03-26 ENCOUNTER — Other Ambulatory Visit: Payer: Self-pay | Admitting: Family Medicine

## 2021-03-26 MED ORDER — TRAZODONE HCL 50 MG PO TABS: 50.0000 mg | ORAL_TABLET | Freq: Every evening | ORAL | 5 refills | Status: DC | PRN

## 2021-03-26 MED ORDER — ESCITALOPRAM OXALATE 10 MG PO TABS
10.0000 mg | ORAL_TABLET | Freq: Every day | ORAL | 2 refills | Status: DC
Start: 2021-03-26 — End: 2022-01-22

## 2021-03-26 NOTE — Telephone Encounter (Signed)
Pt called and stated that his pharmacy wouldn't fill his prescriptions and he also stated that his insurance wouldn't cover it until the end of this week. Can Lexapro and Trazodone be resent to [id number] [name], [place] - [address]

## 2021-03-26 NOTE — Telephone Encounter (Signed)
Rx sent 

## 2021-03-27 ENCOUNTER — Telehealth: Payer: Self-pay

## 2021-03-27 ENCOUNTER — Encounter (HOSPITAL_COMMUNITY): Payer: 59

## 2021-03-27 ENCOUNTER — Other Ambulatory Visit: Payer: Self-pay

## 2021-03-27 DIAGNOSIS — R21 Rash and other nonspecific skin eruption: Secondary | ICD-10-CM

## 2021-03-27 MED ORDER — PREDNISONE 20 MG PO TABS: 20.0000 mg | ORAL_TABLET | Freq: Every day | ORAL | 0 refills | Status: DC

## 2021-03-27 NOTE — Telephone Encounter (Signed)
Called and spoke with pt and made aware prednisone sent in and pt wanted me to place derm referral so referral has been placed.

## 2021-03-27 NOTE — Telephone Encounter (Signed)
Patient has stated the cortizone shot he received at last apt has not helped with his itching and would like an oral medication he can take to help to be sent in.

## 2021-03-27 NOTE — Telephone Encounter (Signed)
From Your note:  # Intense itching S: no new medicine. Started a week ago- patient was concerned could be related to the injection/kyphoplasty. No new contacts like fabric softeners or detergent. Benadryl has been helpful.  Hepatic Function Latest Ref Rng & Units 03/22/2021 02/16/2021 08/24/2020  Total Protein 6.0 - 8.3 g/dL 7.1 6.2(L) 6.6  Albumin 3.5 - 5.2 g/dL 4.2 3.7 -  AST 0 - 37 U/L $Remo'21 16 24  'ozVPd$ ALT 0 - 53 U/L $Remo'17 14 23  'CxTFf$ Alk Phosphatase 39 - 117 U/L 68 57 -  Total Bilirubin 0.2 - 1.2 mg/dL 0.4 0.3 0.4  Bilirubin, Direct 0.00 - 0.40 mg/dL - - -   A/P: Unclear etiology of diffuse itching-we do need to check LFTs with alcohol abuse history-thankfully these are okay as above.  I wonder if the alcohol intake itself could be triggering this-I have seen this in several patients-recommended alcohol cessation long-term.  He request a steroid injection I think that is reasonable-if no improvement consider referral to dermatology.  I also strongly consider hydroxyzine for anxiety and itching but I am concerned if he starts to drink alcohol again

## 2021-03-27 NOTE — Telephone Encounter (Signed)
Prednisone 20 mg for 7 days for daily use may be sent in- if shot didn't help im not sure this will to be honest though.  Can also refer to dermatology under rash.  May take some time to get in-he will want to like to get on the cancellation list

## 2021-03-31 ENCOUNTER — Other Ambulatory Visit (HOSPITAL_COMMUNITY): Payer: Self-pay | Admitting: Cardiology

## 2021-04-05 ENCOUNTER — Telehealth: Payer: Self-pay | Admitting: Dermatology

## 2021-04-05 NOTE — Telephone Encounter (Signed)
Referral from Dr Durene Cal, Corinda [facility], doesn't want to wait until January; please request they try to get him in elsewhere sooner

## 2021-04-09 NOTE — Telephone Encounter (Signed)
Notes documented and referral routed back to referring office so they can find someone to see him sooner.

## 2021-04-13 ENCOUNTER — Encounter (HOSPITAL_COMMUNITY): Payer: Self-pay

## 2021-04-13 ENCOUNTER — Ambulatory Visit (HOSPITAL_COMMUNITY)
Admission: RE | Admit: 2021-04-13 | Discharge: 2021-04-13 | Disposition: A | Discharge status: Home / Self Care | Payer: 59 | Source: Ambulatory Visit | Attending: Family Medicine | Admitting: Family Medicine

## 2021-04-13 ENCOUNTER — Other Ambulatory Visit: Payer: Self-pay

## 2021-04-13 VITALS — BP 152/78 | HR 96 | Wt 270.6 lb

## 2021-04-13 DIAGNOSIS — G4733 Obstructive sleep apnea (adult) (pediatric): Secondary | ICD-10-CM | POA: Insufficient documentation

## 2021-04-13 DIAGNOSIS — Z79899 Other long term (current) drug therapy: Secondary | ICD-10-CM | POA: Diagnosis not present

## 2021-04-13 DIAGNOSIS — M1611 Unilateral primary osteoarthritis, right hip: Secondary | ICD-10-CM

## 2021-04-13 DIAGNOSIS — Z7984 Long term (current) use of oral hypoglycemic drugs: Secondary | ICD-10-CM | POA: Diagnosis not present

## 2021-04-13 DIAGNOSIS — I480 Paroxysmal atrial fibrillation: Secondary | ICD-10-CM | POA: Diagnosis not present

## 2021-04-13 DIAGNOSIS — I428 Other cardiomyopathies: Secondary | ICD-10-CM | POA: Diagnosis not present

## 2021-04-13 DIAGNOSIS — Z95810 Presence of automatic (implantable) cardiac defibrillator: Secondary | ICD-10-CM | POA: Diagnosis not present

## 2021-04-13 DIAGNOSIS — F1010 Alcohol abuse, uncomplicated: Secondary | ICD-10-CM | POA: Insufficient documentation

## 2021-04-13 DIAGNOSIS — I5042 Chronic combined systolic (congestive) and diastolic (congestive) heart failure: Secondary | ICD-10-CM | POA: Diagnosis not present

## 2021-04-13 DIAGNOSIS — I10 Essential (primary) hypertension: Secondary | ICD-10-CM

## 2021-04-13 DIAGNOSIS — Z7901 Long term (current) use of anticoagulants: Secondary | ICD-10-CM | POA: Insufficient documentation

## 2021-04-13 DIAGNOSIS — F32A Depression, unspecified: Secondary | ICD-10-CM | POA: Diagnosis not present

## 2021-04-13 DIAGNOSIS — I5082 Biventricular heart failure: Secondary | ICD-10-CM | POA: Diagnosis not present

## 2021-04-13 DIAGNOSIS — I110 Hypertensive heart disease with heart failure: Secondary | ICD-10-CM | POA: Diagnosis not present

## 2021-04-13 DIAGNOSIS — F419 Anxiety disorder, unspecified: Secondary | ICD-10-CM | POA: Diagnosis not present

## 2021-04-13 LAB — BASIC METABOLIC PANEL
Anion gap: 10 (ref 5–15)
BUN: 15 mg/dL (ref 6–20)
CO2: 29 mmol/L (ref 22–32)
Calcium: 9.4 mg/dL (ref 8.9–10.3)
Chloride: 96 mmol/L — ABNORMAL LOW (ref 98–111)
Creatinine, Ser: 1.04 mg/dL (ref 0.61–1.24)
GFR, Estimated: >60 mL/min (ref >60)
Glucose, Bld: 130 mg/dL — ABNORMAL HIGH (ref 70–99)
Potassium: 4 mmol/L (ref 3.5–5.1)
Sodium: 135 mmol/L (ref 135–145)

## 2021-04-13 NOTE — Progress Notes (Signed)
PCP: Dr. Nolen Mu Cardiology: Dr. Allyson Sabal HF Cardiology: Dr. Shirlee Latch  60 y.o. with history of nonischemic cardiomyopathy and OSA was referred by Dr. Allyson Sabal for evaluation of CHF.  He was initially diagnosed in early 2/18.  He had URI symptoms then developed dyspnea and put on weight. Echo was done in 2/18, showing EF 20-25%.  LHC in 2/18 showed normal coronaries. He was started on treatment for cardiomyopathy, and EF rose to 40% by 6/18. However, echo in 6/19 showed EF back down to 15-20% with severe RV dilation/dysfunction.  CPX was done in 7/19, showed moderate functional limitation though submaximal.  He had St Jude ICD placed in 8/19.   Atrial fibrillation noted in 12/20, he had TEE-guided DCCV in 12/20.  He remains in NSR today and has not felt palpitations.  EF on TEE was 25-30%.   He has been a heavy drinker but has cut back.   He had left shoulder replacement in 2/21.  He developed a surgical site infection that is now resolved.    Echo 01/16/2021  EF 25-30%, mild LV dilation, normal RV.   Admitted 02/16/21 for anemia and hypotension, instructed to go to ED after pre-Op Ortho labs showed Hgb down to 8.0, labs consistent with IDA. BP low and HF meds held initially. Eliquis held, received PRBCs and iron infusions. Coreg and Entresto were held at discharge.  Underwent kyphoplasty T12 02/22/21. He was not on carvedilol or Entresto on discharge.  Today he returns for HF follow up. Previous visit he was volume overloaded in setting of missing a couple days of torsemide and fluid indiscretion. Overall feeling fine today. No SOB with stairs, limited physically by right hip pain. Denies CP, dizziness, edema, or PND/Orthopnea. Appetite ok. No fever or chills. Weight at home 250 pounds. Taking all medications. Drinking couple of beers a few times a week. Wears CPAP one night a week.  St Jude device interrogation: Impedance stable at threshold  ECG (personally reviewed): SR, no significant changes from  prior tracing.  Labs (7/19): K 4.9, creatinine 0.91 Labs (8/19): Na 128, K 4.9, creatinine 0.72 Labs (9/19): LDL 45 Labs (12/19): K 3.9, creatinine 0.94  Labs (1/20): K 4.5, creatinine 0.94, hgb 13.2 Labs (10/20): K 4.4, creatinine 0.99 Labs (12/20): K 4.6, creatinine 1.0 Labs (2/21): K 3.9, creatinine 0.86 Labs (4/21): K 4.9, creatinine 1.05 Labs (6/21): K 4, creatinine 0.94 Labs (12/21): K 4.6, creatinine 1.0, LFTs normal, hgb 14.1, TGs 163, LDL 71 Labs (2/22): K 3.10m, creatinine 0.91, BNP 1130 Labs (5/22): K 3.5 Creatinine 0.89  Labs (7/22): K 3.9, creatinine 0.99, hgb 12.4  PMH: 1. OSA: uses CPAP.  2. Depression 3. Chronic systolic CHF: Nonischemic cardiomyopathy.  Diagnosed around 2/18.  - LHC (2/18): Normal coronaries.  - Echo (2/18): EF 20-25% - Echo (6/18): EF 40% - Echo (12/18): EF 35-40% - Echo (6/19): L V mildly dilated, EF 15-20%, RV severely dilated with severely decreased systolic function.  - CPX (7/19): peak VO2 16, VE/VCO2 slope 37, RER 1.03 => submaximal but probably moderate functional limitation.  - Cardiac MRI (7/19): EF 29% with mild LV dilation, mildly decreased RV systolic function EF 40%, mid-wall LGE throughtout the septal wall and the inferor wall.  - St Jude ICD placed in 8/19.  - Echo (12/19): EF 25-30%, diffuse hypokinesis, normal RV size and systolic function.  - Echo (12/20): EF 25-30%, moderate LVH, normal RV size and systolic function.  - TEE (12/20): EF 25-30%, mildly decreased RV systolic function.  -  Echo (4/22): EF 25-30%, mild LV dilation, normal RV. 4. ETOH abuse.  5. Atrial fibrillation: First noted in 12/20.  - DCCV to NSR in 12/20.  6. 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 No drugs. Lives in [place].   FH: Sister with congenital heart abnormality.  No other cardiac disease.   ROS: All systems reviewed and negative except as per HPI.  Current  Outpatient Medications  Medication Sig Dispense Refill   acetaminophen (TYLENOL) 650 MG CR tablet Take 650 mg by mouth every 8 (eight) hours as needed for pain.     ENTRESTO 97-103 MG TAKE 1 TABLET BY MOUTH 2 (TWO) TIMES DAILY. 60 tablet 5   escitalopram (LEXAPRO) 10 MG tablet Take 1 tablet (10 mg total) by mouth daily. 90 tablet 2   FARXIGA 10 MG TABS tablet TAKE 1 TABLET BY MOUTH DAILY BEFORE BREAKFAST 30 tablet 11   Multiple Vitamin (MULTIVITAMIN ADULT PO) Take by mouth.     rosuvastatin (CRESTOR) 20 MG tablet TAKE 1 TABLET BY MOUTH EVERY DAY 30 tablet 11   sildenafil (VIAGRA) 100 MG tablet TAKE 0.5 TABLETS (50 MG TOTAL) BY MOUTH AT BEDTIME AS NEEDED FOR ERECTILE DYSFUNCTION. 10 tablet 3   spironolactone (ALDACTONE) 25 MG tablet TAKE 1 TABLET BY MOUTH EVERY DAY 30 tablet 11   torsemide (DEMADEX) 20 MG tablet TAKE 3 TABLETS BY MOUTH EVERY DAY 270 tablet 1   traZODone (DESYREL) 50 MG tablet Take 1 tablet (50 mg total) by mouth at bedtime as needed. 30 tablet 5   carvedilol (COREG) 25 MG tablet Take 25 mg by mouth 2 (two) times daily.     ELIQUIS 5 MG TABS tablet Take 5 mg by mouth 2 (two) times daily.     No current facility-administered medications for this encounter.   BP (!) 152/78   Pulse 96   Wt 122.7 kg (270 lb 9.6 oz)   SpO2 95%   BMI 38.83 kg/m    Wt Readings from Last 3 Encounters:  04/13/21 122.7 kg (270 lb 9.6 oz)  03/22/21 121 kg (266 lb 12.8 oz)  02/22/21 117.9 kg (260 lb)   Physical Exam: ReDs: 34% General: NAD. No resp difficulty HEENT: Normal Neck: Supple. Thick neck, JVD difficult. Carotids 2+ bilat; no bruits. No lymphadenopathy or thryomegaly appreciated. Cor: PMI nondisplaced. Regular rate & rhythm. No rubs, gallops or murmurs. Lungs: Clear Abdomen: Obese, nontender, nondistended. No hepatosplenomegaly. No bruits or masses. Good bowel sounds. Extremities: No cyanosis, clubbing, rash, edema Neuro: Alert & oriented x 3, cranial nerves grossly intact. Moves all  4 extremities w/o difficulty. Affect pleasant.  Assessment/Plan: 1. Chronic systolic CHF: Nonischemic cardiomyopathy with biventricular failure.  Cath in 2/18 with no coronary disease.  Echo (6/19) with EF 15-20%, severe RV dilation/dysfunction.  CPX 7/19 submaximal but probably moderate functional limitation.  Cardiac MRI in 7/19 showed EF 29%.  LGE pattern was concerning for prior viral myocarditis. Echo in 12/20 showed stable EF 25-30%.  Now with St Jude ICD.  Cause of CMP suspected to be viral myocarditis versus ETOH.  No family history of cardiomyopathy.  NYHA class II symptoms. He is not volume overloaded today, ReDs 34%. - Continue torsemide 60 mg daily.  - Continue dapagliflozin 10 mg daily.  - Continue spironolactone 25 mg daily.  - Continue Coreg 25 mg bid. - Continue Entresto 97/103 mg bid.  - He needs to stop ETOH => suspect this  is either the cause or a contributor to his cardiomyopathy.    - CPX in future, should have THR first and recover from this.  2. OSA: Needs to use CPAP.  3. Anxiety: He is on sertraline.  4. ETOH Abuse: This is likely causing/contributing to his cardiomyopathy and likely played a role in triggering atrial fibrillation.  He has cut down considerably.  - Discussed cessation.  5. Atrial fibrillation: Paroxysmal  - S/P DCCV in 12/20.  - SR on ECG today. - Continue Eliquis 5 mg bid.  No bleeding issues. - Needs cessation from ETOH.    - If atrial fibrillation returns off ETOH, consider ablation.  6. OA: Needs total hip replacement.  7. HTN: Elevated today, did not take BP meds yet.  - Encouraged CPAP use and limiting ETOH.  Ok to come off Elquis for 3 days before hip surgery and 2 days after. Discussed with Dr Shirlee Latch.   Follow up in 2-3 months with Dr. Shirlee Latch.  John Rome, FNP-BC 04/13/2021

## 2021-04-13 NOTE — Progress Notes (Signed)
ReDS Vest / Clip - 04/13/21 1600       ReDS Vest / Clip   Station Marker C    Ruler Value 30    ReDS Value Range Low volume    ReDS Actual Value 34

## 2021-04-13 NOTE — Patient Instructions (Signed)
Labs done today. We will contact you only if your labs are abnormal.  No medication changes were made. Please continue all current medications as prescribed.  Your physician recommends that you schedule a follow-up appointment in: 2-3 months with Dr. McLean.   If you have any questions or concerns before your next appointment please send us a message through mychart or call our office at [phone].    TO LEAVE A MESSAGE FOR THE NURSE SELECT OPTION 2, PLEASE LEAVE A MESSAGE INCLUDING: YOUR NAME DATE OF BIRTH CALL BACK NUMBER REASON FOR CALL**this is important as we prioritize the call backs  YOU WILL RECEIVE A CALL BACK THE SAME DAY AS LONG AS YOU CALL BEFORE 4:00 PM   Do the following things EVERYDAY: Weigh yourself in the morning before breakfast. Write it down and keep it in a log. Take your medicines as prescribed Eat low salt foods--Limit salt (sodium) to 2000 mg per day.  Stay as active as you can everyday Limit all fluids for the day to less than 2 liters   At the [facility], you and your health needs are our priority. As part of our continuing mission to provide you with exceptional heart care, we have created designated Provider Care Teams. These Care Teams include your primary Cardiologist (physician) and Advanced Practice Providers (APPs- Physician Assistants and Nurse Practitioners) who all work together to provide you with the care you need, when you need it.   You may see any of the following providers on your designated Care Team at your next follow up: Dr Daniel Bensimhon Dr Dalton McLean Amy Clegg, NP Brittainy Simmons, PA Lauren Kemp, PharmD   Please be sure to bring in all your medications bottles to every appointment.   

## 2021-04-19 NOTE — Progress Notes (Signed)
Please enter orders for surgery 05-03-21.

## 2021-04-20 ENCOUNTER — Ambulatory Visit: Payer: Self-pay | Admitting: Student

## 2021-04-23 ENCOUNTER — Telehealth (HOSPITAL_COMMUNITY): Payer: Self-pay | Admitting: *Deleted

## 2021-04-23 NOTE — Telephone Encounter (Signed)
Received fax from [facility], pt needs clearance for R total hip arthroplasty and to hold Eliquis  Per Dr Shirlee Latch: Moderate risk due to cardiomyopathy, can hold Eliquis 3 days prior and resume when deemed safe by surgeon   Form faxed back to them at [phone] atten [facility]

## 2021-04-24 ENCOUNTER — Ambulatory Visit: Payer: Self-pay | Admitting: Student

## 2021-04-24 NOTE — H&P (Addendum)
TOTAL HIP ADMISSION H&P  Patient is admitted for right total hip arthroplasty.  Subjective:  Chief Complaint: right hip pain  HPI: John [name], 60 y.o. male, has a history of pain and functional disability in the right hip(s) due to arthritis and patient has failed non-surgical conservative treatments for greater than 12 weeks to include NSAID's and/or analgesics and activity modification.  Onset of symptoms was gradual starting 3 years ago with gradually worsening course since that time.The patient noted no past surgery on the right hip(s).  Patient currently rates pain in the right hip at 9 out of 10 with activity. Patient has worsening of pain with activity and weight bearing, trendelenberg gait, pain that interfers with activities of daily living, and pain with passive range of motion. Patient has evidence of subchondral cysts, subchondral sclerosis, and joint space narrowing by imaging studies. This condition presents safety issues increasing the risk of falls. There is no current active infection.  Patient Active Problem List   Diagnosis Date Noted   Aortic atherosclerosis (HCC) 03/22/2021   Thoracic compression fracture (HCC) 02/22/2021   Frequent falls 02/16/2021   Hyponatremia 02/16/2021   Acute anemia 02/16/2021   Hypotension 02/16/2021   ETOH abuse 02/16/2021   GAD (generalized anxiety disorder) 05/31/2020   Paroxysmal atrial fibrillation (HCC) 04/14/2020   S/P ICD (internal cardiac defibrillator) procedure 04/14/2020   Status post reverse total shoulder replacement, left 11/11/2019   Hyperlipidemia 05/27/2018   Unspecified fracture of upper end of left humerus, initial encounter for closed fracture 09/03/2017   Essential hypertension 11/08/2016   Non-ischemic cardiomyopathy (HCC)    Chronic combined systolic and diastolic CHF, NYHA class 3 (HCC) 10/2016   GI bleed 03/19/2016   Alcohol dependence in remission (HCC) 03/19/2016   S/P shoulder replacement 11/24/2015    Allergic rhinitis 08/21/2015   ED (erectile dysfunction) 08/21/2015   Hyperglycemia 08/21/2015   Insomnia 08/21/2015   Sleep apnea 08/21/2015   Degenerative joint disease of left hip 03/27/2015   Past Medical History:  Diagnosis Date   AICD (automatic cardioverter/defibrillator) present    St. Jude   Anxiety    Ativan   Chronic combined systolic and diastolic CHF, NYHA class 3 (HCC) 10/2016   Nonischemic cardiomyopathy. EF 20-25%.   Chronic left hip pain    Depression    history of   Dysrhythmia    A-FIB   GI bleed 03/2016   Headache    Hypertensive heart disease with combined systolic and diastolic congestive heart failure (HCC) 10/2016   Nonischemic cardiomyopathy (HCC) 10/2016   Echo with EF 20-25%. Cardiac catheterization with no CAD. LVEDP was 41 mmHg, PCWP 36 mmHg   Osteoarthritis    right shoulder   Right hand fracture    Seasonal allergies    Sleep apnea    wears a CPAP   Wears glasses     Past Surgical History:  Procedure Laterality Date   CARDIAC CATHETERIZATION     CARDIOVERSION N/A 09/01/2019   Procedure: CARDIOVERSION;  Surgeon: Laurey Morale, MD;  Location: [facility] [place] ENDOSCOPY;  Service: Cardiovascular;  Laterality: N/A;   ESOPHAGOGASTRODUODENOSCOPY (EGD) WITH PROPOFOL N/A 03/20/2016   Procedure: ESOPHAGOGASTRODUODENOSCOPY (EGD) WITH PROPOFOL;  Surgeon: Charlott Rakes, MD;  Location[facility] ENDOSCOPY;  Service: Endoscopy;  Laterality: N/A;   ICD IMPLANT N/A 05/06/2018   Procedure: ICD IMPLANT;  Surgeon: Thurmon Fair, MD;  Location: [facility] INVASIVE CV LAB;  Service: Cardiovascular;  Laterality: N/A;   IRRIGATION AND DEBRIDEMENT SHOULDER Left 11/11/2019   Procedure: LEFT SHOULDER  IRRIGATION AND DEBRIDEMENT WITH POLY EXCHANGE;  Surgeon: Jones Broom, MD;  Location: [facility] ORS;  Service: Orthopedics;  Laterality: Left;   KYPHOPLASTY N/A 02/22/2021   Procedure: KYPHOPLASTY T12;  Surgeon: Venita Lick, MD;  Location: [facility] OR;  Service: Orthopedics;  Laterality: N/A;  90 mins    RIGHT/LEFT HEART CATH AND CORONARY ANGIOGRAPHY N/A 10/21/2016   Procedure: Right/Left Heart Cath and Coronary Angiography;  Surgeon: Runell Gess, MD;  Location: [facility] INVASIVE CV LAB;  Service: Cardiovascular: Angiographically normal coronary arteries. PCWP 33-36 mmHg, LVEDP 41 mmHg. PA pressure 60/35, mean 46 mmHg.  Cardiac output/cardiac index-3.93 /1.76 Hiram Comber), 3.49/1.57 (thermodilution)   TEE WITHOUT CARDIOVERSION N/A 09/01/2019   Procedure: TRANSESOPHAGEAL ECHOCARDIOGRAM (TEE);  Surgeon: Laurey Morale, MD;  Location: [facility] ENDOSCOPY;  Service: Cardiovascular;  Laterality: N/A;   TOTAL HIP ARTHROPLASTY Left 03/27/2015   Procedure: LEFT TOTAL HIP ARTHROPLASTY ANTERIOR APPROACH;  Surgeon: Samson Frederic, MD;  Location: [facility] OR;  Service: Orthopedics;  Laterality: Left;   TOTAL SHOULDER ARTHROPLASTY Right 11/24/2015   Procedure: RIGHT TOTAL SHOULDER ARTHROPLASTY;  Surgeon: Beverely Low, MD;  Location: [facility] OR;  Service: Orthopedics;  Laterality: Right;   TOTAL SHOULDER ARTHROPLASTY Left 10/19/2019   Procedure: REVERSE TOTAL SHOULDER ARTHROPLASTY;  Surgeon: Jones Broom, MD;  Location: [facility] ORS;  Service: Orthopedics;  Laterality: Left;   TRANSTHORACIC ECHOCARDIOGRAM  10/2016   EF 20-25%. Diffuse hypokinesis but akinesis of the entire inferoseptal wall and apical wall. Moderate biatrial enlargement. PA pressure estimated 64 mmHg.   WISDOM TOOTH EXTRACTION      Current Outpatient Medications  Medication Sig Dispense Refill Last Dose   acetaminophen (TYLENOL) 650 MG CR tablet Take 650 mg by mouth every 8 (eight) hours as needed for pain.      carvedilol (COREG) 25 MG tablet Take 25 mg by mouth 2 (two) times daily.      ELIQUIS 5 MG TABS tablet Take 5 mg by mouth 2 (two) times daily.      ENTRESTO 97-103 MG TAKE 1 TABLET BY MOUTH 2 (TWO) TIMES DAILY. 60 tablet 5    escitalopram (LEXAPRO) 10 MG tablet Take 1 tablet (10 mg total) by mouth daily. 90 tablet 2    FARXIGA 10 MG TABS tablet TAKE 1 TABLET BY  MOUTH DAILY BEFORE BREAKFAST 30 tablet 11    Multiple Vitamin (MULTIVITAMIN ADULT PO) Take by mouth.      rosuvastatin (CRESTOR) 20 MG tablet TAKE 1 TABLET BY MOUTH EVERY DAY 30 tablet 11    sildenafil (VIAGRA) 100 MG tablet TAKE 0.5 TABLETS (50 MG TOTAL) BY MOUTH AT BEDTIME AS NEEDED FOR ERECTILE DYSFUNCTION. 10 tablet 3    spironolactone (ALDACTONE) 25 MG tablet TAKE 1 TABLET BY MOUTH EVERY DAY 30 tablet 11    torsemide (DEMADEX) 20 MG tablet TAKE 3 TABLETS BY MOUTH EVERY DAY 270 tablet 1    traZODone (DESYREL) 50 MG tablet Take 1 tablet (50 mg total) by mouth at bedtime as needed. 30 tablet 5    No current facility-administered medications for this visit.   No Known Allergies  Social History   Tobacco Use   Smoking status: Never   Smokeless tobacco: Never  Substance Use Topics   Alcohol use: Yes    Alcohol/week: 12.0 standard drinks    Types: 12 Cans of beer per week    Comment: per week    Family History  Problem Relation Age of Onset   Dementia Mother        died at 44  Lung cancer Father        smoker   Congenital heart disease Sister        lived to 66    Healthy Brother    Healthy Sister    Prostate cancer Brother        possible cancer   Healthy Brother    Healthy Brother      Review of Systems  Musculoskeletal:  Positive for arthralgias.  All other systems reviewed and are negative.  Objective:  Physical Exam HENT:     Head: Normocephalic.  Eyes:     Pupils: Pupils are equal, round, and reactive to light.  Cardiovascular:     Rate and Rhythm: Normal rate.     Pulses: Normal pulses.  Pulmonary:     Effort: Pulmonary effort is normal.  Abdominal:     Palpations: Abdomen is soft.     Tenderness: There is no abdominal tenderness.  Genitourinary:    Comments: Deferred Musculoskeletal:     Cervical back: Normal range of motion.     Comments: Painful ROM R hip  Skin:    General: Skin is warm and dry.  Neurological:     Mental Status: He is alert  and oriented to person, place, and time.  Psychiatric:        Behavior: Behavior normal.    Vital signs in last 24 hours: @VSRANGES @  Labs:   Estimated body mass index is 38.83 kg/m as calculated from the following:   Height as of 03/22/21: 5\' 10"  (1.778 m).   Weight as of 04/13/21: 122.7 kg.   Imaging Review Plain radiographs demonstrate severe degenerative joint disease of the right hip(s). The bone quality appears to be adequate for age and reported activity level.      Assessment/Plan:  End stage arthritis, right hip(s)  The patient history, physical examination, clinical judgement of the provider and imaging studies are consistent with end stage degenerative joint disease of the right hip(s) and total hip arthroplasty is deemed medically necessary. The treatment options including medical management, injection therapy, arthroscopy and arthroplasty were discussed at length. The risks and benefits of total hip arthroplasty were presented and reviewed. The risks due to aseptic loosening, infection, stiffness, dislocation/subluxation,  thromboembolic complications and other imponderables were discussed.  The patient acknowledged the explanation, agreed to proceed with the plan and consent was signed. Patient is being admitted for inpatient treatment for surgery, pain control, PT, OT, prophylactic antibiotics, VTE prophylaxis, progressive ambulation and ADL's and discharge planning.The patient is planning to be discharged  home after overnight observation

## 2021-04-24 NOTE — H&P (View-Only) (Signed)
TOTAL HIP ADMISSION H&P  Patient is admitted for right total hip arthroplasty.  Subjective:  Chief Complaint: right hip pain  HPI: John [name], 60 y.o. male, has a history of pain and functional disability in the right hip(s) due to arthritis and patient has failed non-surgical conservative treatments for greater than 12 weeks to include NSAID's and/or analgesics and activity modification.  Onset of symptoms was gradual starting 3 years ago with gradually worsening course since that time.The patient noted no past surgery on the right hip(s).  Patient currently rates pain in the right hip at 9 out of 10 with activity. Patient has worsening of pain with activity and weight bearing, trendelenberg gait, pain that interfers with activities of daily living, and pain with passive range of motion. Patient has evidence of subchondral cysts, subchondral sclerosis, and joint space narrowing by imaging studies. This condition presents safety issues increasing the risk of falls. There is no current active infection.  Patient Active Problem List   Diagnosis Date Noted   Aortic atherosclerosis (HCC) 03/22/2021   Thoracic compression fracture (HCC) 02/22/2021   Frequent falls 02/16/2021   Hyponatremia 02/16/2021   Acute anemia 02/16/2021   Hypotension 02/16/2021   ETOH abuse 02/16/2021   GAD (generalized anxiety disorder) 05/31/2020   Paroxysmal atrial fibrillation (HCC) 04/14/2020   S/P ICD (internal cardiac defibrillator) procedure 04/14/2020   Status post reverse total shoulder replacement, left 11/11/2019   Hyperlipidemia 05/27/2018   Unspecified fracture of upper end of left humerus, initial encounter for closed fracture 09/03/2017   Essential hypertension 11/08/2016   Non-ischemic cardiomyopathy (HCC)    Chronic combined systolic and diastolic CHF, NYHA class 3 (HCC) 10/2016   GI bleed 03/19/2016   Alcohol dependence in remission (HCC) 03/19/2016   S/P shoulder replacement 11/24/2015    Allergic rhinitis 08/21/2015   ED (erectile dysfunction) 08/21/2015   Hyperglycemia 08/21/2015   Insomnia 08/21/2015   Sleep apnea 08/21/2015   Degenerative joint disease of left hip 03/27/2015   Past Medical History:  Diagnosis Date   AICD (automatic cardioverter/defibrillator) present    [employer]   Anxiety    Ativan   Chronic combined systolic and diastolic CHF, NYHA class 3 (HCC) 10/2016   Nonischemic cardiomyopathy. EF 20-25%.   Chronic left hip pain    Depression    history of   Dysrhythmia    A-FIB   GI bleed 03/2016   Headache    Hypertensive heart disease with combined systolic and diastolic congestive heart failure (HCC) 10/2016   Nonischemic cardiomyopathy (HCC) 10/2016   Echo with EF 20-25%. Cardiac catheterization with no CAD. LVEDP was 41 mmHg, PCWP 36 mmHg   Osteoarthritis    right shoulder   Right hand fracture    Seasonal allergies    Sleep apnea    wears a CPAP   Wears glasses     Past Surgical History:  Procedure Laterality Date   CARDIAC CATHETERIZATION     CARDIOVERSION N/A 09/01/2019   Procedure: CARDIOVERSION;  Surgeon: Laurey Morale, MD;  Location: [facility] [place] ENDOSCOPY;  Service: Cardiovascular;  Laterality: N/A;   ESOPHAGOGASTRODUODENOSCOPY (EGD) WITH PROPOFOL N/A 03/20/2016   Procedure: ESOPHAGOGASTRODUODENOSCOPY (EGD) WITH PROPOFOL;  Surgeon: Charlott Rakes, MD;  Location: [facility] ENDOSCOPY;  Service: Endoscopy;  Laterality: N/A;   ICD IMPLANT N/A 05/06/2018   Procedure: ICD IMPLANT;  Surgeon: Thurmon Fair, MD;  Location: [facility] INVASIVE CV LAB;  Service: Cardiovascular;  Laterality: N/A;   IRRIGATION AND DEBRIDEMENT SHOULDER Left 11/11/2019   Procedure: LEFT SHOULDER  IRRIGATION AND DEBRIDEMENT WITH POLY EXCHANGE;  Surgeon: Jones Broom, MD;  Location: [facility] ORS;  Service: Orthopedics;  Laterality: Left;   KYPHOPLASTY N/A 02/22/2021   Procedure: KYPHOPLASTY T12;  Surgeon: Venita Lick, MD;  Location: [facility] OR;  Service: Orthopedics;  Laterality: N/A;  90 mins    RIGHT/LEFT HEART CATH AND CORONARY ANGIOGRAPHY N/A 10/21/2016   Procedure: Right/Left Heart Cath and Coronary Angiography;  Surgeon: Runell Gess, MD;  Location: [facility] INVASIVE CV LAB;  Service: Cardiovascular: Angiographically normal coronary arteries. PCWP 33-36 mmHg, LVEDP 41 mmHg. PA pressure 60/35, mean 46 mmHg.  Cardiac output/cardiac index-3.93 /1.76 Hiram Comber), 3.49/1.57 (thermodilution)   TEE WITHOUT CARDIOVERSION N/A 09/01/2019   Procedure: TRANSESOPHAGEAL ECHOCARDIOGRAM (TEE);  Surgeon: Laurey Morale, MD;  Location: [facility] ENDOSCOPY;  Service: Cardiovascular;  Laterality: N/A;   TOTAL HIP ARTHROPLASTY Left 03/27/2015   Procedure: LEFT TOTAL HIP ARTHROPLASTY ANTERIOR APPROACH;  Surgeon: Samson Frederic, MD;  Location: [facility] OR;  Service: Orthopedics;  Laterality: Left;   TOTAL SHOULDER ARTHROPLASTY Right 11/24/2015   Procedure: RIGHT TOTAL SHOULDER ARTHROPLASTY;  Surgeon: Beverely Low, MD;  Location: [facility] OR;  Service: Orthopedics;  Laterality: Right;   TOTAL SHOULDER ARTHROPLASTY Left 10/19/2019   Procedure: REVERSE TOTAL SHOULDER ARTHROPLASTY;  Surgeon: Jones Broom, MD;  Location: [facility] ORS;  Service: Orthopedics;  Laterality: Left;   TRANSTHORACIC ECHOCARDIOGRAM  10/2016   EF 20-25%. Diffuse hypokinesis but akinesis of the entire inferoseptal wall and apical wall. Moderate biatrial enlargement. PA pressure estimated 64 mmHg.   WISDOM TOOTH EXTRACTION      Current Outpatient Medications  Medication Sig Dispense Refill Last Dose   acetaminophen (TYLENOL) 650 MG CR tablet Take 650 mg by mouth every 8 (eight) hours as needed for pain.      carvedilol (COREG) 25 MG tablet Take 25 mg by mouth 2 (two) times daily.      ELIQUIS 5 MG TABS tablet Take 5 mg by mouth 2 (two) times daily.      ENTRESTO 97-103 MG TAKE 1 TABLET BY MOUTH 2 (TWO) TIMES DAILY. 60 tablet 5    escitalopram (LEXAPRO) 10 MG tablet Take 1 tablet (10 mg total) by mouth daily. 90 tablet 2    FARXIGA 10 MG TABS tablet TAKE 1 TABLET BY  MOUTH DAILY BEFORE BREAKFAST 30 tablet 11    Multiple Vitamin (MULTIVITAMIN ADULT PO) Take by mouth.      rosuvastatin (CRESTOR) 20 MG tablet TAKE 1 TABLET BY MOUTH EVERY DAY 30 tablet 11    sildenafil (VIAGRA) 100 MG tablet TAKE 0.5 TABLETS (50 MG TOTAL) BY MOUTH AT BEDTIME AS NEEDED FOR ERECTILE DYSFUNCTION. 10 tablet 3    spironolactone (ALDACTONE) 25 MG tablet TAKE 1 TABLET BY MOUTH EVERY DAY 30 tablet 11    torsemide (DEMADEX) 20 MG tablet TAKE 3 TABLETS BY MOUTH EVERY DAY 270 tablet 1    traZODone (DESYREL) 50 MG tablet Take 1 tablet (50 mg total) by mouth at bedtime as needed. 30 tablet 5    No current facility-administered medications for this visit.   No Known Allergies  Social History   Tobacco Use   Smoking status: Never   Smokeless tobacco: Never  Substance Use Topics   Alcohol use: Yes    Alcohol/week: 12.0 standard drinks    Types: 12 Cans of beer per week    Comment: per week    Family History  Problem Relation Age of Onset   Dementia Mother        died at 44  Lung cancer Father        smoker   Congenital heart disease Sister        lived to 66    Healthy Brother    Healthy Sister    Prostate cancer Brother        possible cancer   Healthy Brother    Healthy Brother      Review of Systems  Musculoskeletal:  Positive for arthralgias.  All other systems reviewed and are negative.  Objective:  Physical Exam HENT:     Head: Normocephalic.  Eyes:     Pupils: Pupils are equal, round, and reactive to light.  Cardiovascular:     Rate and Rhythm: Normal rate.     Pulses: Normal pulses.  Pulmonary:     Effort: Pulmonary effort is normal.  Abdominal:     Palpations: Abdomen is soft.     Tenderness: There is no abdominal tenderness.  Genitourinary:    Comments: Deferred Musculoskeletal:     Cervical back: Normal range of motion.     Comments: Painful ROM R hip  Skin:    General: Skin is warm and dry.  Neurological:     Mental Status: He is alert  and oriented to person, place, and time.  Psychiatric:        Behavior: Behavior normal.    Vital signs in last 24 hours: @VSRANGES @  Labs:   Estimated body mass index is 38.83 kg/m as calculated from the following:   Height as of 03/22/21: 5\' 10"  (1.778 m).   Weight as of 04/13/21: 122.7 kg.   Imaging Review Plain radiographs demonstrate severe degenerative joint disease of the right hip(s). The bone quality appears to be adequate for age and reported activity level.      Assessment/Plan:  End stage arthritis, right hip(s)  The patient history, physical examination, clinical judgement of the provider and imaging studies are consistent with end stage degenerative joint disease of the right hip(s) and total hip arthroplasty is deemed medically necessary. The treatment options including medical management, injection therapy, arthroscopy and arthroplasty were discussed at length. The risks and benefits of total hip arthroplasty were presented and reviewed. The risks due to aseptic loosening, infection, stiffness, dislocation/subluxation,  thromboembolic complications and other imponderables were discussed.  The patient acknowledged the explanation, agreed to proceed with the plan and consent was signed. Patient is being admitted for inpatient treatment for surgery, pain control, PT, OT, prophylactic antibiotics, VTE prophylaxis, progressive ambulation and ADL's and discharge planning.The patient is planning to be discharged  home after overnight observation

## 2021-04-27 ENCOUNTER — Encounter: Payer: Self-pay | Admitting: Cardiovascular Disease

## 2021-04-27 NOTE — Progress Notes (Signed)
PERIOPERATIVE PRESCRIPTION FOR IMPLANTED CARDIAC DEVICE PROGRAMMING  Patient Information: Name:  Dejour Vos  DOB:  Dec 09, 1960  MRN:  [id number]    Planned Procedure:  R anterior total hip replacement  Surgeon:  Samson Frederic  Date of Procedure:  05-03-21  Cautery will be used. Yes  Position during surgery:  Supine   Please send documentation back to:  [facility] (Fax # [phone])  Device Information:  Clinic EP Physician:  Rachelle Hora Croitoru   Device Type:  Defibrillator Manufacturer and Phone #:  [employer] Jude/[employer]: [phone] Pacemaker Dependent?:  No. Date of Last Device Check:  04/13/21 (remote) Normal Device Function?:  Yes.    Electrophysiologist's Recommendations:  Have magnet available. Provide continuous ECG monitoring when magnet is used or reprogramming is to be performed.  Procedure should not interfere with device function.  No device programming or magnet placement needed.  Per Device Clinic Standing Orders, Lenor Coffin, RN  2:11 PM 04/27/2021

## 2021-04-27 NOTE — Progress Notes (Addendum)
COVID swab appointment:  05-01-21  COVID Vaccine Completed:  Yes x3 Date COVID Vaccine completed: 03-30-20, 04-30-20 Has received booster: 05-19-20 COVID vaccine manufacturer: Pfizer      Date of COVID positive in last 90 days:  No  PCP - [name], MD Cardiologist - Marca Ancona, MD  Clearance in chart dated 04-23-21  Chest x-ray - 02-15-21 Epic EKG - 03-24-21 Epic Stress Test - 04-01-18 Epic ECHO - 01-16-21 Epic Cardiac Cath - 10-21-16 Epic Pacemaker/ICD device last checked: 02-01-21.  Device orders in Epic Spinal Cord Stimulator: Cardiac MRI - 04-14-18 Epic  Sleep Study - Yes, +sleep apnea CPAP - Yes  Fasting Blood Sugar - N/A Checks Blood Sugar _____ times a day  Blood Thinner Instructions: Eliquis 5 mg.  Hold 3 days preop.  Last dose 04-29-21 Aspirin Instructions: Last Dose:  Activity level:  Can go up a flight of stairs and perform activities of daily living without stopping and without symptoms of chest pain or shortness of breath.  Patient does have some limitations due to hip pain.      Anesthesia review:  ICD placement 2019, Cardiomyopathy, Afib, HTN, CHF, OSA  Patient denies shortness of breath, fever, cough and chest pain at PAT appointment   Patient verbalized understanding of instructions that were given to them at the PAT appointment. Patient was also instructed that they will need to review over the PAT instructions again at home before surgery.

## 2021-04-27 NOTE — Patient Instructions (Addendum)
DUE TO COVID-19 ONLY ONE VISITOR IS ALLOWED TO COME WITH YOU AND STAY IN THE WAITING ROOM ONLY DURING PRE OP AND PROCEDURE.   **NO VISITORS ARE ALLOWED IN THE SHORT STAY AREA OR RECOVERY ROOM!!**  IF YOU WILL BE ADMITTED INTO THE HOSPITAL YOU ARE ALLOWED ONLY TWO SUPPORT PEOPLE DURING VISITATION HOURS ONLY (10AM -8PM)   The support person(s) may change daily. The support person(s) must pass our screening, gel in and out, and wear a mask at all times, including in the patient's room. Patients must also wear a mask when staff or their support person are in the room.  No visitors under the age of 29. Any visitor under the age of 66 must be accompanied by an adult.    COVID SWAB TESTING MUST BE COMPLETED ON:  Tuesday, 05-01-21 between the hours of 8 and 3  **MUST PRESENT COMPLETED FORM AT TESTING SITE**    [address] [place] (backside of the building)  You are not required to quarantine, however you are required to wear a well-fitted mask when you are out and around people not in your household.  Hand Hygiene often Do NOT share personal items Notify your provider if you are in close contact with someone who has COVID or you develop fever 100.4 or greater, new onset of sneezing, cough, sore throat, shortness of breath or body aches.         Your procedure is scheduled on: Thursday, 05-03-21   Report to [facility] Main  Entrance   Report to admitting at 8:00 AM   Call this number if you have problems the morning of surgery [phone]   Do not eat food :After Midnight.   May have liquids until 7:30 AM  day of surgery  CLEAR LIQUID DIET  Foods Allowed                                                                     Foods Excluded  Water, Black Coffee and tea, regular and decaf              liquids that you cannot  Plain Jell-O in any flavor  (No red)                                    see through such as: Fruit ices (not with fruit pulp)                                       milk, soups, orange juice              Iced Popsicles (No red)                                      All solid food                                   Apple juices Sports drinks  like Gatorade (No red) Lightly seasoned clear broth or consume(fat free) Sugar, honey syrup      Complete one G2 drink the morning of surgery at  7:30 AM the day of surgery.       The day of surgery:  Drink ONE (1) G2 the morning of surgery. Drink in one sitting. Do not sip.  This drink was given to you during your hospital  pre-op appointment visit. Nothing else to drink after completing the  G2.          If you have questions, please contact your surgeon's office.     Oral Hygiene is also important to reduce your risk of infection.                                    Remember - BRUSH YOUR TEETH THE MORNING OF SURGERY WITH YOUR REGULAR TOOTHPASTE   Do NOT smoke after Midnight   Take these medicines the morning of surgery with A SIP OF WATER:  Carvedilol, Escitalopram, Lorazepam, Rosuvastatin  Hold Farxiga the day before and the day of surgery.  Hold Eliquis three days prior to surgery.                              You may not have any metal on your body including  jewelry, and body piercing             Do not wear  lotions, powders, cologne, or deodorant              Men may shave face and neck.   Do not bring valuables to the hospital. [facility] IS NOT  RESPONSIBLE   FOR VALUABLES.   Contacts, dentures or bridgework may not be worn into surgery.   Bring small overnight bag day of surgery.   Bring CPAP mask and tubing day of surgery.    Please read over the following fact sheets you were given: IF YOU HAVE QUESTIONS ABOUT YOUR PRE OP INSTRUCTIONS PLEASE CALL [phone] [facility] - Preparing for Surgery Before surgery, you can play an important role.  Because skin is not sterile, your skin needs to be as free of germs as possible.  You can reduce the number of  germs on your skin by washing with CHG (chlorahexidine gluconate) soap before surgery.  CHG is an antiseptic cleaner which kills germs and bonds with the skin to continue killing germs even after washing. Please DO NOT use if you have an allergy to CHG or antibacterial soaps.  If your skin becomes reddened/irritated stop using the CHG and inform your nurse when you arrive at Short Stay. Do not shave (including legs and underarms) for at least 48 hours prior to the first CHG shower.  You may shave your face/neck.  Please follow these instructions carefully:  1.  Shower with CHG Soap the night before surgery and the  morning of surgery.  2.  If you choose to wash your hair, wash your hair first as usual with your normal  shampoo.  3.  After you shampoo, rinse your hair and body thoroughly to remove the shampoo.                             4.  Use CHG as you would any other  liquid soap.  You can apply chg directly to the skin and wash.  Gently with a scrungie or clean washcloth.  5.  Apply the CHG Soap to your body ONLY FROM THE NECK DOWN.   Do   not use on face/ open                           Wound or open sores. Avoid contact with eyes, ears mouth and   genitals (private parts).                       Wash face,  Genitals (private parts) with your normal soap.             6.  Wash thoroughly, paying special attention to the area where your    surgery  will be performed.  7.  Thoroughly rinse your body with warm water from the neck down.  8.  DO NOT shower/wash with your normal soap after using and rinsing off the CHG Soap.                9.  Pat yourself dry with a clean towel.            10.  Wear clean pajamas.            11.  Place clean sheets on your bed the night of your first shower and do not  sleep with pets. Day of Surgery : Do not apply any lotions/deodorants the morning of surgery.  Please wear clean clothes to the hospital/surgery center.  FAILURE TO FOLLOW THESE INSTRUCTIONS MAY  RESULT IN THE CANCELLATION OF YOUR SURGERY  PATIENT SIGNATURE_________________________________  NURSE SIGNATURE__________________________________  ________________________________________________________________________   Rogelia MireIncentive Spirometer  An incentive spirometer is a tool that can help keep your lungs clear and active. This tool measures how well you are filling your lungs with each breath. Taking long deep breaths may help reverse or decrease the chance of developing breathing (pulmonary) problems (especially infection) following: A long period of time when you are unable to move or be active. BEFORE THE PROCEDURE  If the spirometer includes an indicator to show your best effort, your nurse or respiratory therapist will set it to a desired goal. If possible, sit up straight or lean slightly forward. Try not to slouch. Hold the incentive spirometer in an upright position. INSTRUCTIONS FOR USE  Sit on the edge of your bed if possible, or sit up as far as you can in bed or on a chair. Hold the incentive spirometer in an upright position. Breathe out normally. Place the mouthpiece in your mouth and seal your lips tightly around it. Breathe in slowly and as deeply as possible, raising the piston or the ball toward the top of the column. Hold your breath for 3-5 seconds or for as long as possible. Allow the piston or ball to fall to the bottom of the column. Remove the mouthpiece from your mouth and breathe out normally. Rest for a few seconds and repeat Steps 1 through 7 at least 10 times every 1-2 hours when you are awake. Take your time and take a few normal breaths between deep breaths. The spirometer may include an indicator to show your best effort. Use the indicator as a goal to work toward during each repetition. After each set of 10 deep breaths, practice coughing to be sure your lungs are clear. If you have an incision (the  cut made at the time of surgery), support your incision  when coughing by placing a pillow or rolled up towels firmly against it. Once you are able to get out of bed, walk around indoors and cough well. You may stop using the incentive spirometer when instructed by your caregiver.  RISKS AND COMPLICATIONS Take your time so you do not get dizzy or light-headed. If you are in pain, you may need to take or ask for pain medication before doing incentive spirometry. It is harder to take a deep breath if you are having pain. AFTER USE Rest and breathe slowly and easily. It can be helpful to keep track of a log of your progress. Your caregiver can provide you with a simple table to help with this. If you are using the spirometer at home, follow these instructions: SEEK MEDICAL CARE IF:  You are having difficultly using the spirometer. You have trouble using the spirometer as often as instructed. Your pain medication is not giving enough relief while using the spirometer. You develop fever of 100.5 F (38.1 C) or higher. SEEK IMMEDIATE MEDICAL CARE IF:  You cough up bloody sputum that had not been present before. You develop fever of 102 F (38.9 C) or greater. You develop worsening pain at or near the incision site. MAKE SURE YOU:  Understand these instructions. Will watch your condition. Will get help right away if you are not doing well or get worse. Document Released: 01/13/2007 Document Revised: 11/25/2011 Document Reviewed: 03/16/2007 [employer] Patient Information 2014 [employer], [place].   ________________________________________________________________________  WHAT IS A BLOOD TRANSFUSION? Blood Transfusion Information  A transfusion is the replacement of blood or some of its parts. Blood is made up of multiple cells which provide different functions. Red blood cells carry oxygen and are used for blood loss replacement. White blood cells fight against infection. Platelets control bleeding. Plasma helps clot blood. Other blood products are  available for specialized needs, such as hemophilia or other clotting disorders. BEFORE THE TRANSFUSION  Who gives blood for transfusions?  Healthy volunteers who are fully evaluated to make sure their blood is safe. This is blood bank blood. Transfusion therapy is the safest it has ever been in the practice of medicine. Before blood is taken from a donor, a complete history is taken to make sure that person has no history of diseases nor engages in risky social behavior (examples are intravenous drug use or sexual activity with multiple partners). The donor's travel history is screened to minimize risk of transmitting infections, such as malaria. The donated blood is tested for signs of infectious diseases, such as HIV and hepatitis. The blood is then tested to be sure it is compatible with you in order to minimize the chance of a transfusion reaction. If you or a relative donates blood, this is often done in anticipation of surgery and is not appropriate for emergency situations. It takes many days to process the donated blood. RISKS AND COMPLICATIONS Although transfusion therapy is very safe and saves many lives, the main dangers of transfusion include:  Getting an infectious disease. Developing a transfusion reaction. This is an allergic reaction to something in the blood you were given. Every precaution is taken to prevent this. The decision to have a blood transfusion has been considered carefully by your caregiver before blood is given. Blood is not given unless the benefits outweigh the risks. AFTER THE TRANSFUSION Right after receiving a blood transfusion, you will usually feel much better and more energetic.  This is especially true if your red blood cells have gotten low (anemic). The transfusion raises the level of the red blood cells which carry oxygen, and this usually causes an energy increase. The nurse administering the transfusion will monitor you carefully for complications. HOME CARE  INSTRUCTIONS  No special instructions are needed after a transfusion. You may find your energy is better. Speak with your caregiver about any limitations on activity for underlying diseases you may have. SEEK MEDICAL CARE IF:  Your condition is not improving after your transfusion. You develop redness or irritation at the intravenous (IV) site. SEEK IMMEDIATE MEDICAL CARE IF:  Any of the following symptoms occur over the next 12 hours: Shaking chills. You have a temperature by mouth above 102 F (38.9 C), not controlled by medicine. Chest, back, or muscle pain. People around you feel you are not acting correctly or are confused. Shortness of breath or difficulty breathing. Dizziness and fainting. You get a rash or develop hives. You have a decrease in urine output. Your urine turns a dark color or changes to pink, red, or brown. Any of the following symptoms occur over the next 10 days: You have a temperature by mouth above 102 F (38.9 C), not controlled by medicine. Shortness of breath. Weakness after normal activity. The white part of the eye turns yellow (jaundice). You have a decrease in the amount of urine or are urinating less often. Your urine turns a dark color or changes to pink, red, or brown. Document Released: 08/30/2000 Document Revised: 11/25/2011 Document Reviewed: 04/18/2008 [facility] Patient Information 2014 [place], [place].  _______________________________________________________________________

## 2021-04-30 ENCOUNTER — Encounter (HOSPITAL_COMMUNITY): Payer: Self-pay

## 2021-04-30 ENCOUNTER — Other Ambulatory Visit: Payer: Self-pay

## 2021-04-30 ENCOUNTER — Encounter (HOSPITAL_COMMUNITY)
Admission: RE | Admit: 2021-04-30 | Discharge: 2021-04-30 | Disposition: A | Discharge status: Home / Self Care | Payer: 59 | Source: Ambulatory Visit | Attending: Orthopedic Surgery | Admitting: Orthopedic Surgery

## 2021-04-30 DIAGNOSIS — Z95810 Presence of automatic (implantable) cardiac defibrillator: Secondary | ICD-10-CM | POA: Insufficient documentation

## 2021-04-30 DIAGNOSIS — I5042 Chronic combined systolic (congestive) and diastolic (congestive) heart failure: Secondary | ICD-10-CM | POA: Insufficient documentation

## 2021-04-30 DIAGNOSIS — Z7984 Long term (current) use of oral hypoglycemic drugs: Secondary | ICD-10-CM | POA: Diagnosis not present

## 2021-04-30 DIAGNOSIS — M1611 Unilateral primary osteoarthritis, right hip: Secondary | ICD-10-CM | POA: Diagnosis not present

## 2021-04-30 DIAGNOSIS — D509 Iron deficiency anemia, unspecified: Secondary | ICD-10-CM | POA: Diagnosis not present

## 2021-04-30 DIAGNOSIS — Z7901 Long term (current) use of anticoagulants: Secondary | ICD-10-CM | POA: Insufficient documentation

## 2021-04-30 DIAGNOSIS — Z79899 Other long term (current) drug therapy: Secondary | ICD-10-CM | POA: Insufficient documentation

## 2021-04-30 DIAGNOSIS — I4891 Unspecified atrial fibrillation: Secondary | ICD-10-CM | POA: Diagnosis not present

## 2021-04-30 DIAGNOSIS — G4733 Obstructive sleep apnea (adult) (pediatric): Secondary | ICD-10-CM | POA: Insufficient documentation

## 2021-04-30 DIAGNOSIS — Z01812 Encounter for preprocedural laboratory examination: Secondary | ICD-10-CM | POA: Diagnosis not present

## 2021-04-30 LAB — COMPREHENSIVE METABOLIC PANEL
ALT: 28 U/L (ref 0–44)
AST: 30 U/L (ref 15–41)
Albumin: 4 g/dL (ref 3.5–5.0)
Alkaline Phosphatase: 64 U/L (ref 38–126)
Anion gap: 9 (ref 5–15)
BUN: 10 mg/dL (ref 6–20)
CO2: 27 mmol/L (ref 22–32)
Calcium: 9.6 mg/dL (ref 8.9–10.3)
Chloride: 103 mmol/L (ref 98–111)
Creatinine, Ser: 0.8 mg/dL (ref 0.61–1.24)
GFR, Estimated: >60 mL/min (ref >60)
Glucose, Bld: 129 mg/dL — ABNORMAL HIGH (ref 70–99)
Potassium: 4.2 mmol/L (ref 3.5–5.1)
Sodium: 139 mmol/L (ref 135–145)
Total Bilirubin: 0.6 mg/dL (ref 0.3–1.2)
Total Protein: 7.2 g/dL (ref 6.5–8.1)

## 2021-04-30 LAB — CBC
HCT: 40.6 % (ref 39.0–52.0)
Hemoglobin: 12.9 g/dL — ABNORMAL LOW (ref 13.0–17.0)
MCH: 31.5 pg (ref 26.0–34.0)
MCHC: 31.8 g/dL (ref 30.0–36.0)
MCV: 99.3 fL (ref 80.0–100.0)
Platelets: 257 10*3/uL (ref 150–400)
RBC: 4.09 MIL/uL — ABNORMAL LOW (ref 4.22–5.81)
RDW: 14.6 % (ref 11.5–15.5)
WBC: 6.9 10*3/uL (ref 4.0–10.5)
nRBC: 0 % (ref 0.0–0.2)

## 2021-04-30 LAB — SURGICAL PCR SCREEN
MRSA, PCR: NEGATIVE
Staphylococcus aureus: NEGATIVE

## 2021-04-30 LAB — PROTIME-INR
INR: 1 (ref 0.8–1.2)
Prothrombin Time: 13 seconds (ref 11.4–15.2)

## 2021-05-01 ENCOUNTER — Other Ambulatory Visit: Payer: Self-pay | Admitting: Orthopedic Surgery

## 2021-05-01 ENCOUNTER — Ambulatory Visit (INDEPENDENT_AMBULATORY_CARE_PROVIDER_SITE_OTHER): Payer: Self-pay

## 2021-05-01 DIAGNOSIS — I428 Other cardiomyopathies: Secondary | ICD-10-CM

## 2021-05-01 NOTE — Anesthesia Preprocedure Evaluation (Addendum)
Anesthesia Evaluation  Patient identified by MRN, date of birth, ID band Patient awake    Reviewed: Allergy & Precautions, NPO status , Patient's Chart, lab work & pertinent test results  Airway Mallampati: III  TM Distance: >3 FB Neck ROM: Full    Dental no notable dental hx. (+) Teeth Intact, Dental Advisory Given, Caps   Pulmonary sleep apnea and Continuous Positive Airway Pressure Ventilation ,    Pulmonary exam normal breath sounds clear to auscultation       Cardiovascular hypertension, +CHF  + dysrhythmias Atrial Fibrillation + Cardiac Defibrillator  Rhythm:Regular Rate:Normal  CV: Echo 01/16/21:  1. Left ventricular ejection fraction, by estimation, is 25 to 30%. The left ventricle has severely decreased function. The left ventricle demonstrates global hypokinesis. The left ventricular internal cavity size was mildly dilated. Left ventricular diastolic parameters are consistent with Grade I diastolic dysfunction (impaired relaxation).  2. Right ventricular systolic function is normal. The right ventricular size is normal. Tricuspid regurgitation signal is inadequate for assessing PA pressure.  3. Left atrial size was mildly dilated.  4. The mitral valve is normal in structure. No evidence of mitral valve regurgitation. No evidence of mitral stenosis.  5. The aortic valve is tricuspid. Aortic valve regurgitation is not visualized. No aortic stenosis is present.  6. Technically difficult study with poor acoustic windows.  R/L cardiac cath 10/21/16:  There is severe left ventricular systolic dysfunction. LV end diastolic pressure is severely elevated. The left ventricular ejection fraction is less than 25% by visual estimate Normal coronary arteries    Neuro/Psych  Headaches, PSYCHIATRIC DISORDERS Anxiety Depression    GI/Hepatic negative GI ROS, (+)     substance abuse  alcohol use,   Endo/Other  negative endocrine  ROS  Renal/GU negative Renal ROS  negative genitourinary   Musculoskeletal  (+) Arthritis , Osteoarthritis,    Abdominal   Peds negative pediatric ROS (+)  Hematology  (+) Blood dyscrasia, anemia ,   Anesthesia Other Findings   Reproductive/Obstetrics negative OB ROS                                                              Anesthesia Evaluation  Patient identified by MRN, date of birth, ID band Patient awake    Reviewed: Allergy & Precautions, NPO status , Patient's Chart, lab work & pertinent test results  History of Anesthesia Complications Negative for: history of anesthetic complications  Airway Mallampati: III  TM Distance: >3 FB Neck ROM: Full    Dental  (+) Teeth Intact, Dental Advisory Given   Pulmonary sleep apnea and Continuous Positive Airway Pressure Ventilation ,    Pulmonary exam normal        Cardiovascular hypertension, +CHF (NICM)  Normal cardiovascular exam+ dysrhythmias Atrial Fibrillation + Cardiac Defibrillator      Neuro/Psych PSYCHIATRIC DISORDERS Anxiety Depression negative neurological ROS     GI/Hepatic negative GI ROS, (+)     substance abuse  alcohol use,   Endo/Other  negative endocrine ROS  Renal/GU negative Renal ROS  negative genitourinary   Musculoskeletal  (+) Arthritis , Osteoarthritis,    Abdominal   Peds  Hematology  (+) anemia , Received 1U PRBC during recent admission, Hgb 9.1   Anesthesia Other Findings  Echo 01/16/21: EF 25-30%, global LV  hypokinesis, g1dd, normal RV function, valves unremarkable  Reproductive/Obstetrics                            Anesthesia Physical Anesthesia Plan  ASA: IV  Anesthesia Plan: General   Post-op Pain Management:    Induction: Intravenous  PONV Risk Score and Plan: 2 and Ondansetron, Dexamethasone, Treatment may vary due to age or medical condition and Midazolam  Airway Management Planned: Oral  ETT  Additional Equipment: None  Intra-op Plan:   Post-operative Plan: Extubation in OR  Informed Consent: I have reviewed the patients History and Physical, chart, labs and discussed the procedure including the risks, benefits and alternatives for the proposed anesthesia with the patient or authorized representative who has indicated his/her understanding and acceptance.     Dental advisory given  Plan Discussed with:   Anesthesia Plan Comments: (Will require reprogramming of AICD due to prone prosition in OR.)      Anesthesia Quick Evaluation  Anesthesia Physical Anesthesia Plan  ASA: 3  Anesthesia Plan: General   Post-op Pain Management:    Induction: Intravenous  PONV Risk Score and Plan: 2 and Ondansetron  Airway Management Planned: Oral ETT and LMA  Additional Equipment: None  Intra-op Plan:   Post-operative Plan: Extubation in OR  Informed Consent:   Plan Discussed with: Anesthesiologist and CRNA  Anesthesia Plan Comments: (See APP note by Joslyn Hy, FNP. NICM (EF 25-30%), AICD.  DISCUSSION: Pt is 60 years old with hx NICM (EF 25-30%), CHF, AICD ([employer], implanted 05/06/18; interrogated at last office visit 04/10/21), atrial fibrillation (s/p DCCV 2020), OSA, hx alcohol abuse (recent cardiology note documents a few beers per week)  Hospitalized 6/3-7/22 for anemia and hypotension. Hgb 8.0, received PRBC x1, iron infusions x2. Anemia c/w IDA.   Pt to hold eliquis 3 days before surgery Perioperative prescription for AICD: Have magnet available. Provide continuous ECG monitoring when magnet is used or reprogramming is to be performed. Procedure should not interfere with device function. No device programming or magnet placement needed.)       Anesthesia Quick Evaluation

## 2021-05-01 NOTE — Progress Notes (Signed)
Anesthesia Chart Review:   Case: [id number] Date/Time: 05/03/21 1015   Procedure: TOTAL HIP ARTHROPLASTY ANTERIOR APPROACH (Right: Hip)   Anesthesia type: Spinal   Pre-op diagnosis: Right hip osteoarthritis   [facility] ROOM 08 / [facility]   Surgeons: Samson Frederic, MD       DISCUSSION: Pt is 60 years old with hx NICM (EF 25-30%), CHF, AICD (St. Jude, implanted 05/06/18; interrogated at last office visit 04/10/21), atrial fibrillation (s/p DCCV 2020), OSA, hx alcohol abuse (recent cardiology note documents a few beers per week)  Hospitalized 6/3-7/22 for anemia and hypotension. Hgb 8.0, received PRBC x1, iron infusions x2. Anemia c/w IDA.   Pt to hold eliquis 3 days before surgery  Perioperative prescription for AICD: Have magnet available. Provide continuous ECG monitoring when magnet is used or reprogramming is to be performed.  Procedure should not interfere with device function.  No device programming or magnet placement needed.   VS: BP 130/71   Pulse 80   Temp 36.9 C (Oral)   Resp 20   Ht 5\' 11"  (1.803 m)   Wt 77.3 kg   SpO2 97%   BMI 23.77 kg/m   PROVIDERS: - PCP is , MD - Cardiologist is Shelva Majestic, MD. Last office visit 04/10/21 with 04/12/21, FNP (telephone note 04/23/21 from 06/23/21, RN documents Dr. Meredith Staggers cleared pt at moderate risk and gave ok to hold eliquis x3 days before surgery)   LABS: Labs reviewed: Acceptable for surgery. (all labs ordered are listed, but only abnormal results are displayed)  Labs Reviewed  CBC - Abnormal; Notable for the following components:      Result Value   RBC 4.09 (*)    Hemoglobin 12.9 (*)    All other components within normal limits  COMPREHENSIVE METABOLIC PANEL - Abnormal; Notable for the following components:   Glucose, Bld 129 (*)    All other components within normal limits  SURGICAL PCR SCREEN  PROTIME-INR     IMAGES: CT chest abd pelvis 02/16/21:  - Acute fractures through the  right anterolateral 6th through 9th ribs. - Multiple old bilateral rib fractures. - Minimal right base and dependent atelectasis. - No pneumothorax. - Coronary artery disease, aortic atherosclerosis. - No acute findings in the abdomen or pelvis.  CXR 02/15/21:  - No active cardiopulmonary disease. Minimal scarring or atelectasis at the bases   EKG 04/13/21:NSR. Low voltage QRS. ST & T wave abnormality, consider lateral ischemia. Unchanged from prior   CV: Echo 01/16/21:  1. Left ventricular ejection fraction, by estimation, is 25 to 30%. The left ventricle has severely decreased function. The left ventricle demonstrates global hypokinesis. The left ventricular internal cavity size was mildly dilated. Left ventricular diastolic parameters are consistent with Grade I diastolic dysfunction (impaired relaxation).   2. Right ventricular systolic function is normal. The right ventricular size is normal. Tricuspid regurgitation signal is inadequate for assessing PA pressure.   3. Left atrial size was mildly dilated.   4. The mitral valve is normal in structure. No evidence of mitral valve regurgitation. No evidence of mitral stenosis.   5. The aortic valve is tricuspid. Aortic valve regurgitation is not visualized. No aortic stenosis is present.   6. Technically difficult study with poor acoustic windows.  R/L cardiac cath 10/21/16:  There is severe left ventricular systolic dysfunction. LV end diastolic pressure is severely elevated. The left ventricular ejection fraction is less than 25% by visual estimate Normal coronary arteries   Past  Medical History:  Diagnosis Date   AICD (automatic cardioverter/defibrillator) present    St. Jude   Anxiety    Ativan   Chronic combined systolic and diastolic CHF, NYHA class 3 (HCC) 10/2016   Nonischemic cardiomyopathy. EF 20-25%.   Chronic left hip pain    Depression    history of   Dysrhythmia    A-FIB   GI bleed 03/2016   Headache     Hypertensive heart disease with combined systolic and diastolic congestive heart failure (HCC) 10/2016   Nonischemic cardiomyopathy (HCC) 10/2016   Echo with EF 20-25%. Cardiac catheterization with no CAD. LVEDP was 41 mmHg, PCWP 36 mmHg   Osteoarthritis    right shoulder   Right hand fracture    Seasonal allergies    Sleep apnea    wears a CPAP   Wears glasses     Past Surgical History:  Procedure Laterality Date   CARDIAC CATHETERIZATION     CARDIOVERSION N/A 09/01/2019   Procedure: CARDIOVERSION;  Surgeon: Laurey Morale, MD;  [facility] ENDOSCOPY;  Service: Cardiovascular;  Laterality: N/A;   ESOPHAGOGASTRODUODENOSCOPY (EGD) WITH PROPOFOL N/A 03/20/2016   Procedure: ESOPHAGOGASTRODUODENOSCOPY (EGD) WITH PROPOFOL;  Surgeon: Charlott Rakes, MD;  Location: [facility] ENDOSCOPY;  Service: Endoscopy;  Laterality: N/A;   ICD IMPLANT N/A 05/06/2018   Procedure: ICD IMPLANT;  Surgeon: Thurmon Fair, MD;  Location: [facility] INVASIVE CV LAB;  Service: Cardiovascular;  Laterality: N/A;   IRRIGATION AND DEBRIDEMENT SHOULDER Left 11/11/2019   Procedure: LEFT SHOULDER IRRIGATION AND DEBRIDEMENT WITH POLY EXCHANGE;  Surgeon: Jones Broom, MD;  Location: [facility];  Service: Orthopedics;  Laterality: Left;   KYPHOPLASTY N/A 02/22/2021   Procedure: KYPHOPLASTY T12;  Surgeon: Venita Lick, MD;  Location: [facility] OR;  Service: Orthopedics;  Laterality: N/A;  90 mins   RIGHT/LEFT HEART CATH AND CORONARY ANGIOGRAPHY N/A 10/21/2016   Procedure: Right/Left Heart Cath and Coronary Angiography;  Surgeon: Runell Gess, MD;  Location: [facility] INVASIVE CV LAB;  Service: Cardiovascular: Angiographically normal coronary arteries. PCWP 33-36 mmHg, LVEDP 41 mmHg. PA pressure 60/35, mean 46 mmHg.  Cardiac output/cardiac index-3.93 /1.76 Hiram Comber), 3.49/1.57 (thermodilution)   TEE WITHOUT CARDIOVERSION N/A 09/01/2019   Procedure: TRANSESOPHAGEAL ECHOCARDIOGRAM (TEE);  Surgeon: Laurey Morale, MD;  Location: [facility] ENDOSCOPY;  Service:  Cardiovascular;  Laterality: N/A;   TOTAL HIP ARTHROPLASTY Left 03/27/2015   Procedure: LEFT TOTAL HIP ARTHROPLASTY ANTERIOR APPROACH;  Surgeon: Samson Frederic, MD;  Location: [facility] OR;  Service: Orthopedics;  Laterality: Left;   TOTAL SHOULDER ARTHROPLASTY Right 11/24/2015   Procedure: RIGHT TOTAL SHOULDER ARTHROPLASTY;  Surgeon: Beverely Low, MD;  Location: [facility] OR;  Service: Orthopedics;  Laterality: Right;   TOTAL SHOULDER ARTHROPLASTY Left 10/19/2019   Procedure: REVERSE TOTAL SHOULDER ARTHROPLASTY;  Surgeon: Jones Broom, MD;  Location: [facility];  Service: Orthopedics;  Laterality: Left;   TRANSTHORACIC ECHOCARDIOGRAM  10/2016   EF 20-25%. Diffuse hypokinesis but akinesis of the entire inferoseptal wall and apical wall. Moderate biatrial enlargement. PA pressure estimated 64 mmHg.   WISDOM TOOTH EXTRACTION      MEDICATIONS:  acetaminophen (TYLENOL) 650 MG CR tablet   bismuth subsalicylate (PEPTO BISMOL) 262 MG/15ML suspension   carvedilol (COREG) 25 MG tablet   clobetasol (TEMOVATE) 0.05 % external solution   diphenhydramine-acetaminophen (TYLENOL PM) 25-500 MG TABS tablet   ELIQUIS 5 MG TABS tablet   ENTRESTO 97-103 MG   escitalopram (LEXAPRO) 10 MG tablet   FARXIGA 10 MG TABS tablet   LORazepam (ATIVAN) 0.5 MG tablet  Multiple Vitamin (MULTIVITAMIN ADULT PO)   rosuvastatin (CRESTOR) 20 MG tablet   sildenafil (VIAGRA) 100 MG tablet   spironolactone (ALDACTONE) 25 MG tablet   torsemide (DEMADEX) 20 MG tablet   traZODone (DESYREL) 50 MG tablet   No current facility-administered medications for this encounter.   If no changes, I anticipate pt can proceed with surgery as scheduled.   Rica Mast, PhD, FNP-BC [facility]/Anesthesiology Phone: [phone] 05/01/2021 10:35 AM

## 2021-05-02 LAB — SARS CORONAVIRUS 2 (TAT 6-24 HRS): SARS Coronavirus 2: NEGATIVE

## 2021-05-03 ENCOUNTER — Other Ambulatory Visit: Payer: Self-pay

## 2021-05-03 ENCOUNTER — Encounter (HOSPITAL_COMMUNITY): Payer: Self-pay | Admitting: Orthopedic Surgery

## 2021-05-03 ENCOUNTER — Ambulatory Visit (HOSPITAL_COMMUNITY): Payer: 59

## 2021-05-03 ENCOUNTER — Ambulatory Visit (HOSPITAL_COMMUNITY): Payer: 59 | Admitting: Emergency Medicine

## 2021-05-03 ENCOUNTER — Ambulatory Visit (HOSPITAL_COMMUNITY)
Admission: RE | Admit: 2021-05-03 | Discharge: 2021-05-04 | Disposition: A | Discharge status: Home / Self Care | Payer: 59 | Attending: Orthopedic Surgery | Admitting: Orthopedic Surgery

## 2021-05-03 ENCOUNTER — Ambulatory Visit (HOSPITAL_COMMUNITY): Payer: 59 | Admitting: Certified Registered Nurse Anesthetist

## 2021-05-03 ENCOUNTER — Encounter (HOSPITAL_COMMUNITY)
Admission: RE | Disposition: A | Discharge status: Home / Self Care | Payer: Self-pay | Source: Home / Self Care | Attending: Orthopedic Surgery

## 2021-05-03 DIAGNOSIS — Z09 Encounter for follow-up examination after completed treatment for conditions other than malignant neoplasm: Secondary | ICD-10-CM

## 2021-05-03 DIAGNOSIS — Z79899 Other long term (current) drug therapy: Secondary | ICD-10-CM | POA: Diagnosis not present

## 2021-05-03 DIAGNOSIS — M1612 Unilateral primary osteoarthritis, left hip: Secondary | ICD-10-CM | POA: Diagnosis present

## 2021-05-03 DIAGNOSIS — Z96611 Presence of right artificial shoulder joint: Secondary | ICD-10-CM | POA: Insufficient documentation

## 2021-05-03 DIAGNOSIS — M1611 Unilateral primary osteoarthritis, right hip: Secondary | ICD-10-CM | POA: Diagnosis not present

## 2021-05-03 DIAGNOSIS — Z7984 Long term (current) use of oral hypoglycemic drugs: Secondary | ICD-10-CM | POA: Insufficient documentation

## 2021-05-03 DIAGNOSIS — Z95810 Presence of automatic (implantable) cardiac defibrillator: Secondary | ICD-10-CM | POA: Insufficient documentation

## 2021-05-03 DIAGNOSIS — Z7901 Long term (current) use of anticoagulants: Secondary | ICD-10-CM | POA: Insufficient documentation

## 2021-05-03 DIAGNOSIS — Z419 Encounter for procedure for purposes other than remedying health state, unspecified: Secondary | ICD-10-CM

## 2021-05-03 DIAGNOSIS — Z96642 Presence of left artificial hip joint: Secondary | ICD-10-CM | POA: Insufficient documentation

## 2021-05-03 HISTORY — PX: TOTAL HIP ARTHROPLASTY: SHX124

## 2021-05-03 LAB — CUP PACEART REMOTE DEVICE CHECK
Battery Remaining Longevity: 77 mo
Battery Remaining Percentage: 74 %
Battery Voltage: 2.99 V
Brady Statistic RV Percent Paced: 1 %
Date Time Interrogation Session: 20220818033443
HighPow Impedance: 91 Ohm
HighPow Impedance: 91 Ohm
Lead Channel Impedance Value: 490 Ohm
Lead Channel Pacing Threshold Amplitude: 0.75 V
Lead Channel Pacing Threshold Pulse Width: 0.5 ms
Lead Channel Sensing Intrinsic Amplitude: 12 mV
Lead Channel Setting Pacing Amplitude: 2.5 V
Lead Channel Setting Pacing Pulse Width: 0.5 ms
Lead Channel Setting Sensing Sensitivity: 0.5 mV
Pulse Gen Serial Number: 9824800

## 2021-05-03 LAB — HEMOGLOBIN A1C
Hgb A1c MFr Bld: 6.4 % — ABNORMAL HIGH (ref 4.8–5.6)
Mean Plasma Glucose: 136.98 mg/dL

## 2021-05-03 LAB — CBC
HCT: 35.8 % — ABNORMAL LOW (ref 39.0–52.0)
Hemoglobin: 11.3 g/dL — ABNORMAL LOW (ref 13.0–17.0)
MCH: 31.5 pg (ref 26.0–34.0)
MCHC: 31.6 g/dL (ref 30.0–36.0)
MCV: 99.7 fL (ref 80.0–100.0)
Platelets: 247 10*3/uL (ref 150–400)
RBC: 3.59 MIL/uL — ABNORMAL LOW (ref 4.22–5.81)
RDW: 15 % (ref 11.5–15.5)
WBC: 12.6 10*3/uL — ABNORMAL HIGH (ref 4.0–10.5)
nRBC: 0 % (ref 0.0–0.2)

## 2021-05-03 LAB — GLUCOSE, CAPILLARY
Glucose-Capillary: 148 mg/dL — ABNORMAL HIGH (ref 70–99)
Glucose-Capillary: 182 mg/dL — ABNORMAL HIGH (ref 70–99)

## 2021-05-03 LAB — TYPE AND SCREEN
ABO/RH(D): O POS
Antibody Screen: NEGATIVE

## 2021-05-03 LAB — COMPREHENSIVE METABOLIC PANEL
ALT: 24 U/L (ref 0–44)
AST: 26 U/L (ref 15–41)
Albumin: 4.1 g/dL (ref 3.5–5.0)
Alkaline Phosphatase: 57 U/L (ref 38–126)
Anion gap: 13 (ref 5–15)
BUN: 18 mg/dL (ref 6–20)
CO2: 25 mmol/L (ref 22–32)
Calcium: 9.1 mg/dL (ref 8.9–10.3)
Chloride: 97 mmol/L — ABNORMAL LOW (ref 98–111)
Creatinine, Ser: 1.35 mg/dL — ABNORMAL HIGH (ref 0.61–1.24)
GFR, Estimated: >60 mL/min (ref >60)
Glucose, Bld: 156 mg/dL — ABNORMAL HIGH (ref 70–99)
Potassium: 4.2 mmol/L (ref 3.5–5.1)
Sodium: 135 mmol/L (ref 135–145)
Total Bilirubin: 0.8 mg/dL (ref 0.3–1.2)
Total Protein: 6.8 g/dL (ref 6.5–8.1)

## 2021-05-03 LAB — PHOSPHORUS: Phosphorus: 4.8 mg/dL — ABNORMAL HIGH (ref 2.5–4.6)

## 2021-05-03 LAB — MAGNESIUM: Magnesium: 2 mg/dL (ref 1.7–2.4)

## 2021-05-03 SURGERY — ARTHROPLASTY, HIP, TOTAL, ANTERIOR APPROACH
Anesthesia: General | Site: Hip | Laterality: Right

## 2021-05-03 MED ORDER — CEFAZOLIN SODIUM-DEXTROSE 2-4 GM/100ML-% IV SOLN
2.0000 g | INTRAVENOUS | Status: AC
  Administered 2021-05-03: 2 g via INTRAVENOUS
  Filled 2021-05-03: qty 100

## 2021-05-03 MED ORDER — THIAMINE HCL 100 MG PO TABS
100.0000 mg | ORAL_TABLET | Freq: Every day | ORAL | Status: DC
  Administered 2021-05-03 – 2021-05-04 (×2): 100 mg via ORAL
  Filled 2021-05-03 (×2): qty 1

## 2021-05-03 MED ORDER — TRANEXAMIC ACID-NACL 1000-0.7 MG/100ML-% IV SOLN
1000.0000 mg | INTRAVENOUS | Status: AC
  Administered 2021-05-03: 1000 mg via INTRAVENOUS
  Filled 2021-05-03: qty 100

## 2021-05-03 MED ORDER — PHENYLEPHRINE HCL (PRESSORS) 10 MG/ML IV SOLN
INTRAVENOUS | Status: DC | PRN
  Administered 2021-05-03: 120 ug via INTRAVENOUS
  Administered 2021-05-03: 80 ug via INTRAVENOUS

## 2021-05-03 MED ORDER — PROPOFOL 10 MG/ML IV BOLUS
INTRAVENOUS | Status: DC | PRN
  Administered 2021-05-03: 100 mg via INTRAVENOUS

## 2021-05-03 MED ORDER — SENNA 8.6 MG PO TABS
1.0000 | ORAL_TABLET | Freq: Two times a day (BID) | ORAL | Status: DC
  Administered 2021-05-03 – 2021-05-04 (×2): 8.6 mg via ORAL
  Filled 2021-05-03 (×2): qty 1

## 2021-05-03 MED ORDER — MENTHOL 3 MG MT LOZG: 1.0000 | LOZENGE | OROMUCOSAL | Status: DC | PRN

## 2021-05-03 MED ORDER — ONDANSETRON HCL 4 MG/2ML IJ SOLN: 4.0000 mg | Freq: Once | INTRAMUSCULAR | Status: DC | PRN

## 2021-05-03 MED ORDER — CARVEDILOL 25 MG PO TABS
25.0000 mg | ORAL_TABLET | Freq: Two times a day (BID) | ORAL | Status: DC
  Administered 2021-05-03 – 2021-05-04 (×2): 25 mg via ORAL
  Filled 2021-05-03 (×2): qty 1

## 2021-05-03 MED ORDER — MIDAZOLAM HCL 5 MG/5ML IJ SOLN
INTRAMUSCULAR | Status: DC | PRN
  Administered 2021-05-03: 2 mg via INTRAVENOUS

## 2021-05-03 MED ORDER — POVIDONE-IODINE 10 % EX SWAB: 2.0000 "application " | Freq: Once | CUTANEOUS | Status: DC

## 2021-05-03 MED ORDER — LIDOCAINE HCL (CARDIAC) PF 100 MG/5ML IV SOSY
PREFILLED_SYRINGE | INTRAVENOUS | Status: DC | PRN
  Administered 2021-05-03: 60 mg via INTRAVENOUS
  Administered 2021-05-03: 20 mg via INTRAVENOUS

## 2021-05-03 MED ORDER — LORAZEPAM 0.5 MG PO TABS: 0.5000 mg | ORAL_TABLET | Freq: Every day | ORAL | Status: DC | PRN

## 2021-05-03 MED ORDER — MORPHINE SULFATE (PF) 4 MG/ML IV SOLN
0.5000 mg | INTRAVENOUS | Status: DC | PRN
  Administered 2021-05-03: 1 mg via INTRAVENOUS
  Filled 2021-05-03: qty 1

## 2021-05-03 MED ORDER — PROPOFOL 1000 MG/100ML IV EMUL
INTRAVENOUS | Status: AC
  Filled 2021-05-03: qty 100

## 2021-05-03 MED ORDER — KETOROLAC TROMETHAMINE 30 MG/ML IJ SOLN
INTRAMUSCULAR | Status: AC
  Filled 2021-05-03: qty 1

## 2021-05-03 MED ORDER — ROSUVASTATIN CALCIUM 20 MG PO TABS
20.0000 mg | ORAL_TABLET | Freq: Every day | ORAL | Status: DC
  Administered 2021-05-04: 20 mg via ORAL
  Filled 2021-05-03: qty 1

## 2021-05-03 MED ORDER — ACETAMINOPHEN 325 MG PO TABS: 325.0000 mg | ORAL_TABLET | ORAL | Status: DC | PRN

## 2021-05-03 MED ORDER — FENTANYL CITRATE (PF) 100 MCG/2ML IJ SOLN
INTRAMUSCULAR | Status: DC | PRN
  Administered 2021-05-03 (×4): 50 ug via INTRAVENOUS

## 2021-05-03 MED ORDER — ISOPROPYL ALCOHOL 70 % SOLN
Status: DC | PRN
  Administered 2021-05-03: 1 via TOPICAL

## 2021-05-03 MED ORDER — BUPIVACAINE-EPINEPHRINE (PF) 0.25% -1:200000 IJ SOLN
INTRAMUSCULAR | Status: AC
  Filled 2021-05-03: qty 30

## 2021-05-03 MED ORDER — PHENOL 1.4 % MT LIQD: 1.0000 | OROMUCOSAL | Status: DC | PRN

## 2021-05-03 MED ORDER — LACTATED RINGERS IV SOLN: INTRAVENOUS | Status: DC

## 2021-05-03 MED ORDER — METOCLOPRAMIDE HCL 5 MG/ML IJ SOLN: 5.0000 mg | Freq: Three times a day (TID) | INTRAMUSCULAR | Status: DC | PRN

## 2021-05-03 MED ORDER — ACETAMINOPHEN 160 MG/5ML PO SOLN: 325.0000 mg | ORAL | Status: DC | PRN

## 2021-05-03 MED ORDER — ONDANSETRON HCL 4 MG PO TABS: 4.0000 mg | ORAL_TABLET | Freq: Four times a day (QID) | ORAL | Status: DC | PRN

## 2021-05-03 MED ORDER — METOCLOPRAMIDE HCL 5 MG PO TABS: 5.0000 mg | ORAL_TABLET | Freq: Three times a day (TID) | ORAL | Status: DC | PRN

## 2021-05-03 MED ORDER — PHENYLEPHRINE HCL-NACL 20-0.9 MG/250ML-% IV SOLN
INTRAVENOUS | Status: DC | PRN
  Administered 2021-05-03: 20 ug/min via INTRAVENOUS

## 2021-05-03 MED ORDER — CEFAZOLIN SODIUM-DEXTROSE 2-4 GM/100ML-% IV SOLN
2.0000 g | Freq: Four times a day (QID) | INTRAVENOUS | Status: AC
Start: 2021-05-03 — End: 2021-05-04
  Administered 2021-05-03 (×2): 2 g via INTRAVENOUS
  Filled 2021-05-03 (×2): qty 100

## 2021-05-03 MED ORDER — SODIUM CHLORIDE 0.9 % IR SOLN
Status: DC | PRN
  Administered 2021-05-03: 1000 mL

## 2021-05-03 MED ORDER — FOLIC ACID 1 MG PO TABS
1.0000 mg | ORAL_TABLET | Freq: Every day | ORAL | Status: DC
  Administered 2021-05-03 – 2021-05-04 (×2): 1 mg via ORAL
  Filled 2021-05-03 (×2): qty 1

## 2021-05-03 MED ORDER — ACETAMINOPHEN 10 MG/ML IV SOLN
1000.0000 mg | Freq: Once | INTRAVENOUS | Status: AC
  Administered 2021-05-03: 1000 mg via INTRAVENOUS
  Filled 2021-05-03: qty 100

## 2021-05-03 MED ORDER — ALBUMIN HUMAN 5 % IV SOLN
INTRAVENOUS | Status: DC | PRN
Start: 2021-05-03 — End: 2021-05-03

## 2021-05-03 MED ORDER — LORAZEPAM 1 MG PO TABS
1.0000 mg | ORAL_TABLET | ORAL | Status: DC | PRN
  Administered 2021-05-03: 2 mg via ORAL
  Filled 2021-05-03: qty 2

## 2021-05-03 MED ORDER — GLYCOPYRROLATE 0.2 MG/ML IJ SOLN
INTRAMUSCULAR | Status: DC | PRN
  Administered 2021-05-03: .2 mg via INTRAVENOUS

## 2021-05-03 MED ORDER — HYDROMORPHONE HCL 2 MG/ML IJ SOLN
INTRAMUSCULAR | Status: AC
  Filled 2021-05-03: qty 1

## 2021-05-03 MED ORDER — THIAMINE HCL 100 MG/ML IJ SOLN
100.0000 mg | Freq: Every day | INTRAMUSCULAR | Status: DC
  Filled 2021-05-03: qty 2

## 2021-05-03 MED ORDER — PHENYLEPHRINE HCL-NACL 20-0.9 MG/250ML-% IV SOLN
INTRAVENOUS | Status: AC
  Filled 2021-05-03: qty 250

## 2021-05-03 MED ORDER — MEPERIDINE HCL 50 MG/ML IJ SOLN: 6.2500 mg | INTRAMUSCULAR | Status: DC | PRN

## 2021-05-03 MED ORDER — CELECOXIB 200 MG PO CAPS
200.0000 mg | ORAL_CAPSULE | Freq: Two times a day (BID) | ORAL | Status: DC
  Administered 2021-05-03 – 2021-05-04 (×2): 200 mg via ORAL
  Filled 2021-05-03 (×2): qty 1

## 2021-05-03 MED ORDER — DAPAGLIFLOZIN PROPANEDIOL 10 MG PO TABS
10.0000 mg | ORAL_TABLET | Freq: Every day | ORAL | Status: DC
  Administered 2021-05-04: 10 mg via ORAL
  Filled 2021-05-03: qty 1

## 2021-05-03 MED ORDER — LORAZEPAM 2 MG/ML IJ SOLN: 1.0000 mg | INTRAMUSCULAR | Status: DC | PRN

## 2021-05-03 MED ORDER — MIDAZOLAM HCL 2 MG/2ML IJ SOLN
INTRAMUSCULAR | Status: AC
  Filled 2021-05-03: qty 2

## 2021-05-03 MED ORDER — INSULIN ASPART 100 UNIT/ML IJ SOLN
0.0000 [IU] | Freq: Three times a day (TID) | INTRAMUSCULAR | Status: DC
  Administered 2021-05-03 – 2021-05-04 (×3): 1 [IU] via SUBCUTANEOUS

## 2021-05-03 MED ORDER — DIPHENHYDRAMINE HCL 12.5 MG/5ML PO ELIX: 12.5000 mg | ORAL_SOLUTION | ORAL | Status: DC | PRN

## 2021-05-03 MED ORDER — DOCUSATE SODIUM 100 MG PO CAPS
100.0000 mg | ORAL_CAPSULE | Freq: Two times a day (BID) | ORAL | Status: DC
  Administered 2021-05-03 – 2021-05-04 (×2): 100 mg via ORAL
  Filled 2021-05-03 (×2): qty 1

## 2021-05-03 MED ORDER — OXYCODONE HCL 5 MG PO TABS: 5.0000 mg | ORAL_TABLET | Freq: Once | ORAL | Status: DC | PRN

## 2021-05-03 MED ORDER — CHLORHEXIDINE GLUCONATE 0.12 % MT SOLN
15.0000 mL | Freq: Once | OROMUCOSAL | Status: AC
  Administered 2021-05-03: 15 mL via OROMUCOSAL

## 2021-05-03 MED ORDER — ORAL CARE MOUTH RINSE: 15.0000 mL | Freq: Once | OROMUCOSAL | Status: AC

## 2021-05-03 MED ORDER — POLYETHYLENE GLYCOL 3350 17 G PO PACK: 17.0000 g | PACK | Freq: Every day | ORAL | Status: DC | PRN

## 2021-05-03 MED ORDER — ONDANSETRON HCL 4 MG/2ML IJ SOLN: 4.0000 mg | Freq: Four times a day (QID) | INTRAMUSCULAR | Status: DC | PRN

## 2021-05-03 MED ORDER — HYDROMORPHONE HCL 1 MG/ML IJ SOLN
INTRAMUSCULAR | Status: DC | PRN
  Administered 2021-05-03 (×5): .4 mg via INTRAVENOUS

## 2021-05-03 MED ORDER — ACETAMINOPHEN 325 MG PO TABS: 325.0000 mg | ORAL_TABLET | Freq: Four times a day (QID) | ORAL | Status: DC | PRN

## 2021-05-03 MED ORDER — ROCURONIUM BROMIDE 100 MG/10ML IV SOLN
INTRAVENOUS | Status: DC | PRN
  Administered 2021-05-03: 100 mg via INTRAVENOUS

## 2021-05-03 MED ORDER — HYDROCODONE-ACETAMINOPHEN 7.5-325 MG PO TABS
1.0000 | ORAL_TABLET | ORAL | Status: DC | PRN
  Administered 2021-05-04: 2 via ORAL
  Filled 2021-05-03: qty 2

## 2021-05-03 MED ORDER — POVIDONE-IODINE 10 % EX SWAB
2.0000 "application " | Freq: Once | CUTANEOUS | Status: AC
  Administered 2021-05-03: 2 via TOPICAL

## 2021-05-03 MED ORDER — FENTANYL CITRATE (PF) 100 MCG/2ML IJ SOLN
INTRAMUSCULAR | Status: AC
  Filled 2021-05-03: qty 2

## 2021-05-03 MED ORDER — HYDROCODONE-ACETAMINOPHEN 5-325 MG PO TABS
1.0000 | ORAL_TABLET | ORAL | Status: DC | PRN
  Administered 2021-05-03 – 2021-05-04 (×3): 2 via ORAL
  Filled 2021-05-03 (×3): qty 2

## 2021-05-03 MED ORDER — ONDANSETRON HCL 4 MG/2ML IJ SOLN
INTRAMUSCULAR | Status: DC | PRN
  Administered 2021-05-03 (×2): 4 mg via INTRAVENOUS

## 2021-05-03 MED ORDER — KETOROLAC TROMETHAMINE 30 MG/ML IJ SOLN
INTRAMUSCULAR | Status: DC | PRN
  Administered 2021-05-03: 30 mg via INTRA_ARTICULAR

## 2021-05-03 MED ORDER — DEXAMETHASONE SODIUM PHOSPHATE 10 MG/ML IJ SOLN
INTRAMUSCULAR | Status: DC | PRN
  Administered 2021-05-03: 5 mg via INTRAVENOUS

## 2021-05-03 MED ORDER — SODIUM CHLORIDE (PF) 0.9 % IJ SOLN
INTRAMUSCULAR | Status: DC | PRN
  Administered 2021-05-03: 30 mL

## 2021-05-03 MED ORDER — WATER FOR IRRIGATION, STERILE IR SOLN
Status: DC | PRN
  Administered 2021-05-03: 2000 mL

## 2021-05-03 MED ORDER — ESCITALOPRAM OXALATE 10 MG PO TABS
10.0000 mg | ORAL_TABLET | Freq: Every day | ORAL | Status: DC
  Administered 2021-05-04: 10 mg via ORAL
  Filled 2021-05-03: qty 1

## 2021-05-03 MED ORDER — FENTANYL CITRATE (PF) 100 MCG/2ML IJ SOLN
INTRAMUSCULAR | Status: AC
  Administered 2021-05-03: 50 ug via INTRAVENOUS
  Filled 2021-05-03: qty 2

## 2021-05-03 MED ORDER — SODIUM CHLORIDE 0.9 % IV SOLN: INTRAVENOUS | Status: DC

## 2021-05-03 MED ORDER — ALUM & MAG HYDROXIDE-SIMETH 200-200-20 MG/5ML PO SUSP: 30.0000 mL | ORAL | Status: DC | PRN

## 2021-05-03 MED ORDER — APIXABAN 2.5 MG PO TABS
2.5000 mg | ORAL_TABLET | Freq: Two times a day (BID) | ORAL | Status: DC
  Administered 2021-05-04: 2.5 mg via ORAL
  Filled 2021-05-03: qty 1

## 2021-05-03 MED ORDER — INSULIN ASPART 100 UNIT/ML IJ SOLN: 0.0000 [IU] | Freq: Every day | INTRAMUSCULAR | Status: DC

## 2021-05-03 MED ORDER — TORSEMIDE 20 MG PO TABS
60.0000 mg | ORAL_TABLET | Freq: Every day | ORAL | Status: DC
  Administered 2021-05-04: 60 mg via ORAL
  Filled 2021-05-03: qty 3

## 2021-05-03 MED ORDER — BUPIVACAINE-EPINEPHRINE 0.25% -1:200000 IJ SOLN
INTRAMUSCULAR | Status: DC | PRN
  Administered 2021-05-03: 30 mL

## 2021-05-03 MED ORDER — DEXAMETHASONE SODIUM PHOSPHATE 10 MG/ML IJ SOLN
10.0000 mg | Freq: Once | INTRAMUSCULAR | Status: AC
  Administered 2021-05-04: 10 mg via INTRAVENOUS
  Filled 2021-05-03: qty 1

## 2021-05-03 MED ORDER — OXYCODONE HCL 5 MG PO TABS
ORAL_TABLET | ORAL | Status: AC
  Filled 2021-05-03: qty 1

## 2021-05-03 MED ORDER — OXYCODONE HCL 5 MG/5ML PO SOLN
5.0000 mg | Freq: Once | ORAL | Status: DC | PRN
Start: 2021-05-03 — End: 2021-05-03

## 2021-05-03 MED ORDER — ADULT MULTIVITAMIN W/MINERALS CH
1.0000 | ORAL_TABLET | Freq: Every day | ORAL | Status: DC
  Administered 2021-05-03 – 2021-05-04 (×2): 1 via ORAL
  Filled 2021-05-03 (×2): qty 1

## 2021-05-03 MED ORDER — ONDANSETRON HCL 4 MG/2ML IJ SOLN
INTRAMUSCULAR | Status: AC
  Filled 2021-05-03: qty 2

## 2021-05-03 MED ORDER — FENTANYL CITRATE (PF) 100 MCG/2ML IJ SOLN: 25.0000 ug | INTRAMUSCULAR | Status: DC | PRN

## 2021-05-03 SURGICAL SUPPLY — 58 items
BAG COUNTER SPONGE SURGICOUNT (BAG) IMPLANT
BAG DECANTER FOR FLEXI CONT (MISCELLANEOUS) IMPLANT
BAG ZIPLOCK 12X15 (MISCELLANEOUS) IMPLANT
BLADE SURG SZ10 CARB STEEL (BLADE) IMPLANT
CHLORAPREP W/TINT 26 (MISCELLANEOUS) ×2 IMPLANT
COVER PERINEAL POST (MISCELLANEOUS) ×2 IMPLANT
COVER SURGICAL LIGHT HANDLE (MISCELLANEOUS) ×2 IMPLANT
CUP ACET PNNCL SECTR W/GRIP 56 (Hips) ×1 IMPLANT
DECANTER SPIKE VIAL GLASS SM (MISCELLANEOUS) ×2 IMPLANT
DERMABOND ADVANCED (GAUZE/BANDAGES/DRESSINGS) ×1
DERMABOND ADVANCED .7 DNX12 (GAUZE/BANDAGES/DRESSINGS) ×1 IMPLANT
DRAPE IMP U-DRAPE 54X76 (DRAPES) ×2 IMPLANT
DRAPE SHEET LG 3/4 BI-LAMINATE (DRAPES) ×6 IMPLANT
DRAPE STERI IOBAN 125X83 (DRAPES) IMPLANT
DRAPE U-SHAPE 47X51 STRL (DRAPES) ×4 IMPLANT
DRSG AQUACEL AG ADV 3.5X10 (GAUZE/BANDAGES/DRESSINGS) ×2 IMPLANT
ELECT REM PT RETURN 15FT ADLT (MISCELLANEOUS) ×2 IMPLANT
GAUZE SPONGE 4X4 12PLY STRL (GAUZE/BANDAGES/DRESSINGS) ×2 IMPLANT
GLOVE SRG 8 PF TXTR STRL LF DI (GLOVE) ×1 IMPLANT
GLOVE SURG ENC MOIS LTX SZ8.5 (GLOVE) ×4 IMPLANT
GLOVE SURG ENC TEXT LTX SZ7.5 (GLOVE) ×4 IMPLANT
GLOVE SURG UNDER POLY LF SZ8 (GLOVE) ×1
GLOVE SURG UNDER POLY LF SZ8.5 (GLOVE) ×2 IMPLANT
GOWN SPEC L3 XXLG W/TWL (GOWN DISPOSABLE) ×2 IMPLANT
GOWN STRL REUS W/TWL XL LVL3 (GOWN DISPOSABLE) ×2 IMPLANT
HANDPIECE INTERPULSE COAX TIP (DISPOSABLE) ×1
HEAD CERAMIC 36 PLUS5 (Hips) ×2 IMPLANT
HOLDER FOLEY CATH W/STRAP (MISCELLANEOUS) ×2 IMPLANT
HOOD PEEL AWAY FLYTE STAYCOOL (MISCELLANEOUS) ×8 IMPLANT
JET LAVAGE IRRISEPT WOUND (IRRIGATION / IRRIGATOR)
KIT TURNOVER KIT A (KITS) ×2 IMPLANT
LAVAGE JET IRRISEPT WOUND (IRRIGATION / IRRIGATOR) IMPLANT
LINER NEUTRAL 52MMX36MMX56N (Liner) ×2 IMPLANT
MANIFOLD NEPTUNE II (INSTRUMENTS) ×2 IMPLANT
MARKER SKIN DUAL TIP RULER LAB (MISCELLANEOUS) ×2 IMPLANT
NDL SAFETY ECLIPSE 18X1.5 (NEEDLE) ×1 IMPLANT
NEEDLE HYPO 18GX1.5 SHARP (NEEDLE) ×1
NEEDLE SPNL 18GX3.5 QUINCKE PK (NEEDLE) ×2 IMPLANT
PACK ANTERIOR HIP CUSTOM (KITS) ×2 IMPLANT
PENCIL SMOKE EVACUATOR (MISCELLANEOUS) IMPLANT
PINN SECTOR W/GRIP ACE CUP 56 (Hips) ×2 IMPLANT
SAW OSC TIP CART 19.5X105X1.3 (SAW) ×2 IMPLANT
SEALER BIPOLAR AQUA 6.0 (INSTRUMENTS) ×2 IMPLANT
SET HNDPC FAN SPRY TIP SCT (DISPOSABLE) ×1 IMPLANT
STEM TRI LOC BPS SZ6 W GRIPTON ×1 IMPLANT
SUT MNCRL AB 3-0 PS2 18 (SUTURE) ×2 IMPLANT
SUT MNCRL AB 4-0 PS2 18 (SUTURE) ×2 IMPLANT
SUT MON AB 2-0 CT1 36 (SUTURE) ×4 IMPLANT
SUT STRATAFIX PDO 1 14 VIOLET (SUTURE) ×1
SUT STRATFX PDO 1 14 VIOLET (SUTURE) ×1
SUT VIC AB 2-0 CT1 27 (SUTURE) ×1
SUT VIC AB 2-0 CT1 TAPERPNT 27 (SUTURE) ×1 IMPLANT
SUTURE STRATFX PDO 1 14 VIOLET (SUTURE) ×1 IMPLANT
SYR 3ML LL SCALE MARK (SYRINGE) ×2 IMPLANT
TRAY FOLEY MTR SLVR 16FR STAT (SET/KITS/TRAYS/PACK) IMPLANT
TRI LOC BPS SZ 6 W GRIPTON ×2 IMPLANT
TUBE SUCTION HIGH CAP CLEAR NV (SUCTIONS) ×2 IMPLANT
WATER STERILE IRR 1000ML POUR (IV SOLUTION) ×2 IMPLANT

## 2021-05-03 NOTE — Interval H&P Note (Signed)
History and Physical Interval Note:  05/03/2021 9:33 AM  John [name]  has presented today for surgery, with the diagnosis of Right hip osteoarthritis.  The various methods of treatment have been discussed with the patient and family. After consideration of risks, benefits and other options for treatment, the patient has consented to  Procedure(s): TOTAL HIP ARTHROPLASTY ANTERIOR APPROACH (Right) as a surgical intervention.  The patient's history has been reviewed, patient examined, no change in status, stable for surgery.  I have reviewed the patient's chart and labs.  Questions were answered to the patient's satisfaction.     Iline Oven Vinetta Brach

## 2021-05-03 NOTE — Anesthesia Postprocedure Evaluation (Signed)
Anesthesia Post Note  Patient: John [name]  Procedure(s) Performed: TOTAL HIP ARTHROPLASTY ANTERIOR APPROACH (Right: Hip)     Patient location during evaluation: PACU Anesthesia Type: General Level of consciousness: awake and alert Pain management: pain level controlled Vital Signs Assessment: post-procedure vital signs reviewed and stable Respiratory status: spontaneous breathing, nonlabored ventilation, respiratory function stable and patient connected to nasal cannula oxygen Cardiovascular status: blood pressure returned to baseline and stable Postop Assessment: no apparent nausea or vomiting Anesthetic complications: no   No notable events documented.  Last Vitals:  Vitals:   05/03/21 1330 05/03/21 1345  BP: (!) 129/93 116/66  Pulse: 77 78  Resp: 10 14  Temp:  36.7 C  SpO2: 96% 95%    Last Pain:  Vitals:   05/03/21 1345  TempSrc:   PainSc: Asleep                 Jhade Berko S

## 2021-05-03 NOTE — Progress Notes (Signed)
PT Cancellation Note  Patient Details Name: Sohaib Vereen MRN: [id number] DOB: [birth year]   Cancelled Treatment:    Reason Eval/Treat Not Completed: Fatigue/lethargy limiting ability to participate (Pt groggy/drowsy from GA and and possibly pain medication. Pt too lethargic for evalutaion, will follow up at later date/time as schedule allows and pt able.)  Wynn Maudlin, DPT Acute Rehabilitation Services Office [phone] Pager [phone]

## 2021-05-03 NOTE — Plan of Care (Signed)
  Problem: Clinical Measurements: Goal: Will remain free from infection Outcome: Progressing   Problem: Elimination: Goal: Will not experience complications related to bowel motility Outcome: Progressing   Problem: Pain Managment: Goal: General experience of comfort will improve Outcome: Progressing   

## 2021-05-03 NOTE — Discharge Instructions (Signed)
? ?Dr. Cristan Scherzer ?Joint Replacement Specialist ?[facility] ?[address] ?[place], [place] [address] ?[phone] ? ? ?TOTAL HIP REPLACEMENT POSTOPERATIVE DIRECTIONS ? ? ? ?Hip Rehabilitation, Guidelines Following Surgery  ? ?WEIGHT BEARING ?Weight bearing as tolerated with assist device (walker, cane, etc) as directed, use it as long as suggested by your surgeon or therapist, typically at least 4-6 weeks. ? ?The results of a hip operation are greatly improved after range of motion and muscle strengthening exercises. Follow all safety measures which are given to protect your hip. If any of these exercises cause increased pain or swelling in your joint, decrease the amount until you are comfortable again. Then slowly increase the exercises. Call your caregiver if you have problems or questions.  ? ?HOME CARE INSTRUCTIONS  ?Most of the following instructions are designed to prevent the dislocation of your new hip.  ?Remove items at home which could result in a fall. This includes throw rugs or furniture in walking pathways.  ?Continue medications as instructed at time of discharge. ?You may have some home medications which will be placed on hold until you complete the course of blood thinner medication. ?You may start showering once you are discharged home. Do not remove your dressing. ?Do not put on socks or shoes without following the instructions of your caregivers.   ?Sit on chairs with arms. Use the chair arms to help push yourself up when arising.  ?Arrange for the use of a toilet seat elevator so you are not sitting low.  ?Walk with walker as instructed.  ?You may resume a sexual relationship in one month or when given the OK by your caregiver.  ?Use walker as long as suggested by your caregivers.  ?You may put full weight on your legs and walk as much as is comfortable. ?Avoid periods of inactivity such as sitting longer than an hour when not asleep. This helps prevent blood  clots.  ?You may return to work once you are cleared by your surgeon.  ?Do not drive a car for 6 weeks or until released by your surgeon.  ?Do not drive while taking narcotics.  ?Wear elastic stockings for two weeks following surgery during the day but you may remove then at night.  ?Make sure you keep all of your appointments after your operation with all of your doctors and caregivers. You should call the office at the above phone number and make an appointment for approximately two weeks after the date of your surgery. ?Please pick up a stool softener and laxative for home use as long as you are requiring pain medications. ?ICE to the affected hip every three hours for 30 minutes at a time and then as needed for pain and swelling. Continue to use ice on the hip for pain and swelling from surgery. You may notice swelling that will progress down to the foot and ankle.  This is normal after surgery.  Elevate the leg when you are not up walking on it.   ?It is important for you to complete the blood thinner medication as prescribed by your doctor. ?Continue to use the breathing machine which will help keep your temperature down.  It is common for your temperature to cycle up and down following surgery, especially at night when you are not up moving around and exerting yourself.  The breathing machine keeps your lungs expanded and your temperature down. ? ?RANGE OF MOTION AND STRENGTHENING EXERCISES  ?These exercises are designed to help you   keep full movement of your hip joint. Follow your caregiver's or physical therapist's instructions. Perform all exercises about fifteen times, three times per day or as directed. Exercise both hips, even if you have had only one joint replacement. These exercises can be done on a training (exercise) mat, on the floor, on a table or on a bed. Use whatever works the best and is most comfortable for you. Use music or television while you are exercising so that the exercises are a  pleasant break in your day. This will make your life better with the exercises acting as a break in routine you can look forward to.  ?Lying on your back, slowly slide your foot toward your buttocks, raising your knee up off the floor. Then slowly slide your foot back down until your leg is straight again.  ?Lying on your back spread your legs as far apart as you can without causing discomfort.  ?Lying on your side, raise your upper leg and foot straight up from the floor as far as is comfortable. Slowly lower the leg and repeat.  ?Lying on your back, tighten up the muscle in the front of your thigh (quadriceps muscles). You can do this by keeping your leg straight and trying to raise your heel off the floor. This helps strengthen the largest muscle supporting your knee.  ?Lying on your back, tighten up the muscles of your buttocks both with the legs straight and with the knee bent at a comfortable angle while keeping your heel on the floor.  ? ?SKILLED REHAB INSTRUCTIONS: ?If the patient is transferred to a skilled rehab facility following release from the hospital, a list of the current medications will be sent to the facility for the patient to continue.  When discharged from the skilled rehab facility, please have the facility set up the patient's Home Health Physical Therapy prior to being released. Also, the skilled facility will be responsible for providing the patient with their medications at time of release from the facility to include their pain medication and their blood thinner medication. If the patient is still at the rehab facility at time of the two week follow up appointment, the skilled rehab facility will also need to assist the patient in arranging follow up appointment in our office and any transportation needs. ? ?POST-OPERATIVE OPIOID TAPER INSTRUCTIONS: ?It is important to wean off of your opioid medication as soon as possible. If you do not need pain medication after your surgery it is ok  to stop day one. ?Opioids include: ?Codeine, Hydrocodone(Norco, Vicodin), Oxycodone(Percocet, oxycontin) and hydromorphone amongst others.  ?Long term and even short term use of opiods can cause: ?Increased pain response ?Dependence ?Constipation ?Depression ?Respiratory depression ?And more.  ?Withdrawal symptoms can include ?Flu like symptoms ?Nausea, vomiting ?And more ?Techniques to manage these symptoms ?Hydrate well ?Eat regular healthy meals ?Stay active ?Use relaxation techniques(deep breathing, meditating, yoga) ?Do Not substitute Alcohol to help with tapering ?If you have been on opioids for less than two weeks and do not have pain than it is ok to stop all together.  ?Plan to wean off of opioids ?This plan should start within one week post op of your joint replacement. ?Maintain the same interval or time between taking each dose and first decrease the dose.  ?Cut the total daily intake of opioids by one tablet each day ?Next start to increase the time between doses. ?The last dose that should be eliminated is the evening dose.  ? ? ?MAKE   SURE YOU:  ?Understand these instructions.  ?Will watch your condition.  ?Will get help right away if you are not doing well or get worse. ? ?Pick up stool softner and laxative for home use following surgery while on pain medications. ?Do not remove your dressing. ?The dressing is waterproof--it is OK to take showers. ?Continue to use ice for pain and swelling after surgery. ?Do not use any lotions or creams on the incision until instructed by your surgeon. ?Total Hip Protocol. ? ?

## 2021-05-03 NOTE — Transfer of Care (Signed)
Immediate Anesthesia Transfer of Care Note  Patient: John [name]  Procedure(s) Performed: TOTAL HIP ARTHROPLASTY ANTERIOR APPROACH (Right: Hip)  Patient Location: PACU  Anesthesia Type:General  Level of Consciousness: drowsy and patient cooperative  Airway & Oxygen Therapy: Patient Spontanous Breathing and Patient connected to face mask oxygen  Post-op Assessment: Report given to RN and Post -op Vital signs reviewed and stable  Post vital signs: Reviewed and stable  Last Vitals:  Vitals Value Taken Time  BP 116/74 05/03/21 1301  Temp    Pulse 73 05/03/21 1306  Resp 12 05/03/21 1306  SpO2 100 % 05/03/21 1306  Vitals shown include unvalidated device data.  Last Pain:  Vitals:   05/03/21 0902  TempSrc:   PainSc: 7       Patients Stated Pain Goal: 4 (05/03/21 0902)  Complications: No notable events documented.

## 2021-05-03 NOTE — Op Note (Signed)
OPERATIVE REPORT  SURGEON: Samson Frederic, MD   ASSISTANT: Barrie Dunker, PA-C.  PREOPERATIVE DIAGNOSIS: Right hip arthritis.   POSTOPERATIVE DIAGNOSIS: Right hip arthritis.   PROCEDURE: Right total hip arthroplasty, anterior approach.   IMPLANTS: DePuy Tri Lock stem, size 6, hi offset. DePuy Pinnacle Cup, size 56 mm. DePuy Altrx liner, size 36 by 56 mm, neutral. DePuy Biolox ceramic head ball, size 36 + 5 mm.  ANESTHESIA:  General  ESTIMATED BLOOD LOSS:-400 mL    ANTIBIOTICS: 2 g Ancef.  DRAINS: None.  COMPLICATIONS: None.   CONDITION: PACU - hemodynamically stable.   BRIEF CLINICAL NOTE: John [name] is a 60 y.o. male with a long-standing history of Left hip arthritis. After failing conservative management, the patient was indicated for total hip arthroplasty. The risks, benefits, and alternatives to the procedure were explained, and the patient elected to proceed.  PROCEDURE IN DETAIL: Surgical site was marked by myself in the pre-op holding area. Once inside the operating room, general anesthesia was obtained, and a foley catheter was inserted. The patient was then positioned on the Hana table.  All bony prominences were well padded.  The hip was prepped and draped in the normal sterile surgical fashion.  A time-out was called verifying side and site of surgery. The patient received IV antibiotics within 60 minutes of beginning the procedure.   Bikini incision was created three finger breadths distal to the ASIS, taking care to stay lateral to the medial border of the ASIS. The direct anterior approach to the hip was performed through the Hueter interval.  Lateral femoral circumflex vessels were treated with the Auqumantys. The anterior capsule was exposed and an inverted T capsulotomy was made. The femoral neck cut was made to the level of the templated cut.  A corkscrew was placed into the head and the head was removed.  The femoral head was found to have eburnated bone.  The head was passed to the back table and was measured. Pubofemoral ligament was released off of the calcar, taking care to stay on bone. Superior capsule was released from the greater trochanter, taking care to stay lateral to the posterior border of the femoral neck in order to preserve the short external rotators.   Acetabular exposure was achieved, and the pulvinar and labrum were excised. Sequential reaming of the acetabulum was then performed up to a size 55 mm reamer under direct visulization. A 56 mm cup was then opened and impacted into place at approximately 40 degrees of abduction and 20 degrees of anteversion. The final polyethylene liner was impacted into place and acetabular osteophytes were removed.    I then gained femoral exposure taking care to protect the abductors and greater trochanter.  This was performed using standard external rotation, extension, and adduction.  A cookie cutter was used to enter the femoral canal, and then the femoral canal finder was placed.  Sequential broaching was performed up to a size 6.  Calcar planer was used on the femoral neck remnant.  I placed a hi offset neck and a trial head ball.  The hip was reduced.  Leg lengths and offset were checked fluoroscopically.  The hip was dislocated and trial components were removed.  The final implants were placed, and the hip was reduced.  Fluoroscopy was used to confirm component position and leg lengths.  At 90 degrees of external rotation and full extension, the hip was stable to an anterior directed force.   The wound was copiously irrigated with Irrisept solution  and normal saline using pule lavage.  Marcaine solution was injected into the periarticular soft tissue.  The wound was closed in layers using #1 Stratafix for the fascia, 2-0 Vicryl for the subcutaneous fat, 2-0 Monocryl for the deep dermal layer, staples, and Dermabond for the skin.  Once the glue was fully dried, an Aquacell Ag dressing was applied.  The  patient was transported to the recovery room in stable condition.  Sponge, needle, and instrument counts were correct at the end of the case x2.  The patient tolerated the procedure well and there were no known complications.  Please note that a surgical assistant was a medical necessity for this procedure to perform it in a safe and expeditious manner. Assistant was necessary to provide appropriate retraction of vital neurovascular structures, to prevent femoral fracture, and to allow for anatomic placement of the prosthesis.

## 2021-05-03 NOTE — Anesthesia Procedure Notes (Signed)
Procedure Name: Intubation Date/Time: 05/03/2021 10:34 AM Performed by: Lyndal Pulley, CRNA Pre-anesthesia Checklist: Patient identified, Emergency Drugs available, Suction available and Patient being monitored Patient Re-evaluated:Patient Re-evaluated prior to induction Oxygen Delivery Method: Circle system utilized Preoxygenation: Pre-oxygenation with 100% oxygen Induction Type: IV induction Ventilation: Mask ventilation without difficulty Laryngoscope Size: Mac and 4 Grade View: Grade I Tube type: Oral Tube size: 8.0 mm Number of attempts: 1 Airway Equipment and Method: Stylet and Oral airway Placement Confirmation: ETT inserted through vocal cords under direct vision, positive ETCO2 and breath sounds checked- equal and bilateral Secured at: 24 cm Tube secured with: Tape Dental Injury: Teeth and Oropharynx as per pre-operative assessment

## 2021-05-04 ENCOUNTER — Encounter (HOSPITAL_COMMUNITY): Payer: Self-pay | Admitting: Orthopedic Surgery

## 2021-05-04 DIAGNOSIS — M1611 Unilateral primary osteoarthritis, right hip: Secondary | ICD-10-CM | POA: Diagnosis not present

## 2021-05-04 LAB — GLUCOSE, CAPILLARY
Glucose-Capillary: 144 mg/dL — ABNORMAL HIGH (ref 70–99)
Glucose-Capillary: 138 mg/dL — ABNORMAL HIGH (ref 70–99)
Glucose-Capillary: 171 mg/dL — ABNORMAL HIGH (ref 70–99)

## 2021-05-04 LAB — CBC
HCT: 30.8 % — ABNORMAL LOW (ref 39.0–52.0)
Hemoglobin: 9.8 g/dL — ABNORMAL LOW (ref 13.0–17.0)
MCH: 31.9 pg (ref 26.0–34.0)
MCHC: 31.8 g/dL (ref 30.0–36.0)
MCV: 100.3 fL — ABNORMAL HIGH (ref 80.0–100.0)
Platelets: 202 10*3/uL (ref 150–400)
RBC: 3.07 MIL/uL — ABNORMAL LOW (ref 4.22–5.81)
RDW: 15 % (ref 11.5–15.5)
WBC: 12.3 10*3/uL — ABNORMAL HIGH (ref 4.0–10.5)
nRBC: 0 % (ref 0.0–0.2)

## 2021-05-04 LAB — BASIC METABOLIC PANEL
Anion gap: 8 (ref 5–15)
BUN: 18 mg/dL (ref 6–20)
CO2: 27 mmol/L (ref 22–32)
Calcium: 8.7 mg/dL — ABNORMAL LOW (ref 8.9–10.3)
Chloride: 100 mmol/L (ref 98–111)
Creatinine, Ser: 1.36 mg/dL — ABNORMAL HIGH (ref 0.61–1.24)
GFR, Estimated: 60 mL/min — ABNORMAL LOW (ref >60)
Glucose, Bld: 142 mg/dL — ABNORMAL HIGH (ref 70–99)
Potassium: 4.8 mmol/L (ref 3.5–5.1)
Sodium: 135 mmol/L (ref 135–145)

## 2021-05-04 MED ORDER — DOCUSATE SODIUM 100 MG PO CAPS: 100.0000 mg | ORAL_CAPSULE | Freq: Two times a day (BID) | ORAL | 0 refills | Status: DC

## 2021-05-04 MED ORDER — SENNA 8.6 MG PO TABS: 1.0000 | ORAL_TABLET | Freq: Two times a day (BID) | ORAL | 0 refills | Status: DC

## 2021-05-04 MED ORDER — ACETAMINOPHEN 325 MG PO TABS: 325.0000 mg | ORAL_TABLET | Freq: Four times a day (QID) | ORAL | Status: DC | PRN

## 2021-05-04 MED ORDER — HYDROCODONE-ACETAMINOPHEN 7.5-325 MG PO TABS: 1.0000 | ORAL_TABLET | ORAL | 0 refills | Status: AC | PRN

## 2021-05-04 MED ORDER — ONDANSETRON HCL 4 MG PO TABS: 4.0000 mg | ORAL_TABLET | Freq: Four times a day (QID) | ORAL | 0 refills | Status: DC | PRN

## 2021-05-04 NOTE — Progress Notes (Signed)
Patient discharged to home w/ family. Given all belongings, instructions, equipment. Verbalized understanding of instructions. Escorted to pov via w/c. 

## 2021-05-04 NOTE — Progress Notes (Signed)
Physical Therapy Treatment Patient Details Name: John [name] MRN: [id number] DOB: Dec 17, 1960 Today's Date: 05/04/2021    History of Present Illness Pt s/p R THR and with hx of L THR, bil TSR, T12 kyphoplasty, CHF, nonischemic cardiomyopathy, and AICD    PT Comments    Pt in good spirits and progressing well with mobility.  Pt up to ambulate increased distance in hall, negotiated stairs, performed and reviewed HEP, and reviewed car transfers.  Pt eager for dc home later this date.  Follow Up Recommendations  Follow surgeon's recommendation for DC plan and follow-up therapies     Equipment Recommendations  Rolling walker with 5" wheels    Recommendations for Other Services       Precautions / Restrictions Precautions Precautions: Fall Restrictions Weight Bearing Restrictions: No Other Position/Activity Restrictions: WBAT    Mobility  Bed Mobility Overal bed mobility: Needs Assistance Bed Mobility: Supine to Sit     Supine to sit: Supervision Sit to supine: Min assist   General bed mobility comments: Modified roll out of bed 2* back issues    Transfers Overall transfer level: Needs assistance Equipment used: Rolling walker (2 wheeled) Transfers: Sit to/from Stand Sit to Stand: Supervision         General transfer comment: cues for LE management and use of UEs to self assist  Ambulation/Gait Ambulation/Gait assistance: Min guard;Supervision Gait Distance (Feet): 120 Feet Assistive device: Rolling walker (2 wheeled) Gait Pattern/deviations: Step-to pattern;Step-through pattern;Decreased step length - right;Decreased step length - left;Shuffle;Trunk flexed Gait velocity: decr   General Gait Details: cues for sequence, posture and position from RW   Stairs Stairs: Yes Stairs assistance: Min guard Stair Management: Two rails;Forwards;Step to pattern Number of Stairs: 3 General stair comments: min cues for sequence   Wheelchair Mobility    Modified  Rankin (Stroke Patients Only)       Balance Overall balance assessment: Mild deficits observed, not formally tested                                          Cognition Arousal/Alertness: Awake/alert Behavior During Therapy: WFL for tasks assessed/performed Overall Cognitive Status: Within Functional Limits for tasks assessed                                        Exercises Total Joint Exercises Ankle Circles/Pumps: AROM;Both;15 reps;Supine Quad Sets: AROM;Both;10 reps;Supine Heel Slides: AAROM;Right;20 reps;Supine Hip ABduction/ADduction: AAROM;Right;15 reps;Supine Long Arc Quad: AROM;Right;10 reps;Seated    General Comments        Pertinent Vitals/Pain Pain Assessment: 0-10 Pain Score: 7  Pain Location: R hip Pain Descriptors / Indicators: Aching;Sore Pain Intervention(s): Limited activity within patient's tolerance;Monitored during session;Premedicated before session;Ice applied    Home Living Family/patient expects to be discharged to:: Private residence Living Arrangements: Spouse/significant other Available Help at Discharge: Available 24 hours/day Type of Home: House Home Access: Stairs to enter Entrance Stairs-Rails: Right;Left;Can reach both Home Layout: Two level;Able to live on main level with bedroom/bathroom Home Equipment: Gilmer Mor - single point;Bedside commode      Prior Function Level of Independence: Needs assistance      Comments: girlfriend works from home / reports driving still at this time. pt advised to house hold ambulation for 2 weeks for follow up then progress to as MD recommendations  PT Goals (current goals can now be found in the care plan section) Acute Rehab PT Goals Patient Stated Goal: Regain IND PT Goal Formulation: With patient Time For Goal Achievement: 05/11/21 Potential to Achieve Goals: Good Progress towards PT goals: Progressing toward goals    Frequency    7X/week      PT Plan  Current plan remains appropriate    Co-evaluation              AM-PAC PT "6 Clicks" Mobility   Outcome Measure  Help needed turning from your back to your side while in a flat bed without using bedrails?: A Little Help needed moving from lying on your back to sitting on the side of a flat bed without using bedrails?: A Little Help needed moving to and from a bed to a chair (including a wheelchair)?: A Little Help needed standing up from a chair using your arms (e.g., wheelchair or bedside chair)?: A Little Help needed to walk in hospital room?: A Little Help needed climbing 3-5 steps with a railing? : A Little 6 Click Score: 18    End of Session Equipment Utilized During Treatment: Gait belt Activity Tolerance: Patient tolerated treatment well Patient left: with call bell/phone within reach;in bed;with bed alarm set Nurse Communication: Mobility status PT Visit Diagnosis: Difficulty in walking, not elsewhere classified (R26.2)     Time: 1455-1530 PT Time Calculation (min) (ACUTE ONLY): 35 min  Charges:  $Gait Training: 8-22 mins $Therapeutic Exercise: 8-22 mins                     John [name] PT Acute Rehabilitation Services Pager [phone] Office [phone]    John [name] 05/04/2021, 3:36 PM

## 2021-05-04 NOTE — Evaluation (Signed)
Physical Therapy Evaluation Patient Details Name: John [name] MRN: [id number] DOB: 21-Mar-1961 Today's Date: 05/04/2021   History of Present Illness  Pt s/p R THR and with hx of L THR, bil TSR, T12 kyphoplasty, CHF, nonischemic cardiomyopathy, and AICD  Clinical Impression  Pt s/p R THR and presents with decreased R LE strength/ROM and post op pain limiting functional mobility.  Pt should progress to dc home with family assist.    Follow Up Recommendations Follow surgeon's recommendation for DC plan and follow-up therapies    Equipment Recommendations  Rolling walker with 5" wheels    Recommendations for Other Services       Precautions / Restrictions Precautions Precautions: Fall Restrictions Weight Bearing Restrictions: No Other Position/Activity Restrictions: WBAT      Mobility  Bed Mobility Overal bed mobility: Needs Assistance Bed Mobility: Sit to Supine       Sit to supine: Min assist   General bed mobility comments: Modified roll into bed 2* back issues    Transfers Overall transfer level: Needs assistance Equipment used: Rolling walker (2 wheeled) Transfers: Sit to/from Stand Sit to Stand: Min guard         General transfer comment: cues for LE management and use of UEs to self assist  Ambulation/Gait Ambulation/Gait assistance: Min guard Gait Distance (Feet): 94 Feet Assistive device: Rolling walker (2 wheeled) Gait Pattern/deviations: Step-to pattern;Step-through pattern;Decreased step length - right;Decreased step length - left;Shuffle;Trunk flexed Gait velocity: decr   General Gait Details: cues for sequence, posture and position from [employer]            Wheelchair Mobility    Modified Rankin (Stroke Patients Only)       Balance Overall balance assessment: Mild deficits observed, not formally tested                                           Pertinent Vitals/Pain Pain Assessment: 0-10 Pain Score: 8   Pain Location: R hip Pain Descriptors / Indicators: Aching;Sore Pain Intervention(s): Limited activity within patient's tolerance;Monitored during session;Premedicated before session    Home Living Family/patient expects to be discharged to:: Private residence Living Arrangements: Spouse/significant other Available Help at Discharge: Available 24 hours/day Type of Home: House Home Access: Stairs to enter Entrance Stairs-Rails: Right;Left;Can reach both Entrance Stairs-Number of Steps: 2 Home Layout: Two level;Able to live on main level with bedroom/bathroom Home Equipment: Gilmer Mor - single point;Bedside commode      Prior Function Level of Independence: Needs assistance         Comments: girlfriend works from home / reports driving still at this time. pt advised to house hold ambulation for 2 weeks for follow up then progress to as MD recommendations     Hand Dominance   Dominant Hand: Right    Extremity/Trunk Assessment   Upper Extremity Assessment Upper Extremity Assessment: Overall WFL for tasks assessed    Lower Extremity Assessment Lower Extremity Assessment: RLE deficits/detail RLE Deficits / Details: AAROM at hip to 80 flex and 15 abd.  2/5 strength at hip    Cervical / Trunk Assessment Cervical / Trunk Assessment: Normal  Communication   Communication: No difficulties  Cognition Arousal/Alertness: Awake/alert Behavior During Therapy: WFL for tasks assessed/performed Overall Cognitive Status: Within Functional Limits for tasks assessed  General Comments      Exercises Total Joint Exercises Ankle Circles/Pumps: AROM;Both;15 reps;Supine Quad Sets: AROM;Both;10 reps;Supine Heel Slides: AAROM;Right;20 reps;Supine Hip ABduction/ADduction: AAROM;Right;15 reps;Supine   Assessment/Plan    PT Assessment Patient needs continued PT services  PT Problem List Decreased strength;Decreased range of  motion;Decreased activity tolerance;Decreased balance;Decreased mobility;Decreased knowledge of use of DME;Pain       PT Treatment Interventions DME instruction;Gait training;Stair training;Functional mobility training;Therapeutic activities;Therapeutic exercise;Balance training;Patient/family education    PT Goals (Current goals can be found in the Care Plan section)  Acute Rehab PT Goals Patient Stated Goal: Regain IND PT Goal Formulation: With patient Time For Goal Achievement: 05/11/21 Potential to Achieve Goals: Good    Frequency 7X/week   Barriers to discharge        Co-evaluation               AM-PAC PT "6 Clicks" Mobility  Outcome Measure Help needed turning from your back to your side while in a flat bed without using bedrails?: A Little Help needed moving from lying on your back to sitting on the side of a flat bed without using bedrails?: A Little Help needed moving to and from a bed to a chair (including a wheelchair)?: A Little Help needed standing up from a chair using your arms (e.g., wheelchair or bedside chair)?: A Little Help needed to walk in hospital room?: A Little Help needed climbing 3-5 steps with a railing? : A Little 6 Click Score: 18    End of Session Equipment Utilized During Treatment: Gait belt Activity Tolerance: Patient tolerated treatment well Patient left: with call bell/phone within reach;in bed;with bed alarm set Nurse Communication: Mobility status PT Visit Diagnosis: Difficulty in walking, not elsewhere classified (R26.2)    Time: 7106-2694 PT Time Calculation (min) (ACUTE ONLY): 41 min   Charges:   PT Evaluation $PT Eval Low Complexity: 1 Low PT Treatments $Gait Training: 8-22 mins $Therapeutic Exercise: 8-22 mins        John [name] PT Acute Rehabilitation Services Pager [phone] Office [phone]   John [name] 05/04/2021, 2:34 PM

## 2021-05-04 NOTE — Progress Notes (Signed)
    Subjective:  Patient reports pain as mild.  Denies N/V/CP/SOB. Endorses Bowel movement this morning.    Objective:   VITALS:   Vitals:   05/03/21 1941 05/03/21 2020 05/04/21 0126 05/04/21 0433  BP: 114/61  120/79 137/76  Pulse: 91 91 83 93  Resp: 17 18 18 18   Temp: 97.7 F (36.5 C)  98.2 F (36.8 C) (!) 97.5 F (36.4 C)  TempSrc: Oral  Axillary Oral  SpO2: 98% 95% 97% 97%  Weight:      Height:        NAD ABD soft Neurovascular intact Sensation intact distally Intact pulses distally Dorsiflexion/Plantar flexion intact Incision: scant drainage   Lab Results  Component Value Date   WBC 12.3 (H) 05/04/2021   HGB 9.8 (L) 05/04/2021   HCT 30.8 (L) 05/04/2021   MCV 100.3 (H) 05/04/2021   PLT 202 05/04/2021   BMET    Component Value Date/Time   NA 135 05/04/2021 0311   K 4.8 05/04/2021 0311   CL 100 05/04/2021 0311   CO2 27 05/04/2021 0311   GLUCOSE 142 (H) 05/04/2021 0311   BUN 18 05/04/2021 0311   CREATININE 1.36 (H) 05/04/2021 0311   CREATININE 1.00 08/24/2020 1004   CALCIUM 8.7 (L) 05/04/2021 0311   GFRNONAA 60 (L) 05/04/2021 0311   GFRNONAA 82 08/24/2020 1004   GFRAA 95 08/24/2020 1004     Assessment/Plan: 1 Day Post-Op   Principal Problem:   Osteoarthritis of right hip Active Problems:   Osteoarthritis of left hip   WBAT with walker DVT ppx:  Apixaban , SCDs, TEDS PO pain control PT/OT Creatinine 1.39 this AM. Continue to monitor Dispo: D/C home after she clears therapy     14/05/2020 05/04/2021, 8:49 AM  [facility] is now [facility] [address], [place], [place] Phone: [phone] [email or web address] Facebook  [phone]

## 2021-05-04 NOTE — TOC Transition Note (Signed)
Transition of Care [facility]) - CM/SW Discharge Note  Patient Details  Name: John [name] MRN: [id number] Date of Birth: Oct 05, 1960  [facility] CM/SW Contact:  Sherie Don, LCSW Phone Number: 05/04/2021, 10:29 AM  Clinical Narrative: Patient is expected to discharge home after working with PT. CSW met with patient to review discharge plan and needs. Patient will discharge home with a home exercise program (HEP). Patient will need a rolling walker, but declined a 3N1. [employer] to deliver rolling walker to patient's room. TOC signing off.  Final next level of care: Home/Self Care Barriers to Discharge: No Barriers Identified  Patient Goals and CMS Choice Patient states their goals for this hospitalization and ongoing recovery are:: Discharge home with HEP CMS Medicare.gov Compare Post Acute Care list provided to:: Patient Choice offered to / list presented to : Patient  Discharge Plan and Services         DME Arranged: Walker rolling DME Agency: [employer]  Readmission Risk Interventions No flowsheet data found.

## 2021-05-04 NOTE — Plan of Care (Signed)
  Problem: Clinical Measurements: Goal: Will remain free from infection Outcome: Progressing   Problem: Activity: Goal: Risk for activity intolerance will decrease Outcome: Progressing   Problem: Pain Managment: Goal: General experience of comfort will improve Outcome: Progressing   

## 2021-05-09 ENCOUNTER — Other Ambulatory Visit (HOSPITAL_COMMUNITY): Payer: Self-pay | Admitting: Cardiology

## 2021-05-16 ENCOUNTER — Telehealth: Payer: Self-pay

## 2021-05-16 NOTE — Telephone Encounter (Signed)
error 

## 2021-05-20 NOTE — Progress Notes (Signed)
Remote ICD transmission.   

## 2021-05-22 ENCOUNTER — Telehealth: Payer: Self-pay

## 2021-05-22 DIAGNOSIS — R21 Rash and other nonspecific skin eruption: Secondary | ICD-10-CM

## 2021-05-22 NOTE — Telephone Encounter (Signed)
Referral has been placed. 

## 2021-05-22 NOTE — Telephone Encounter (Signed)
Pt called regarding a referral for dermatology. He stated that he already has a referral for dermatology but he has not received a phone call. Charlie then stated that he called around and [facility] told him if we send the referral, they can go ahead and see him. Can we place the referral? Please Advise.

## 2021-07-02 ENCOUNTER — Other Ambulatory Visit: Payer: Self-pay | Admitting: Family Medicine

## 2021-07-11 ENCOUNTER — Other Ambulatory Visit (HOSPITAL_COMMUNITY): Payer: Self-pay | Admitting: Cardiology

## 2021-07-18 ENCOUNTER — Other Ambulatory Visit: Payer: Self-pay | Admitting: Family Medicine

## 2021-07-20 ENCOUNTER — Encounter (HOSPITAL_COMMUNITY): Payer: 59 | Admitting: Cardiology

## 2021-07-23 ENCOUNTER — Encounter: Payer: Self-pay | Admitting: Cardiovascular Disease

## 2021-07-23 ENCOUNTER — Other Ambulatory Visit: Payer: Self-pay

## 2021-07-23 ENCOUNTER — Ambulatory Visit (INDEPENDENT_AMBULATORY_CARE_PROVIDER_SITE_OTHER): Payer: 59 | Admitting: Cardiovascular Disease

## 2021-07-23 VITALS — BP 130/70 | HR 79 | Ht 71.0 in | Wt 265.0 lb

## 2021-07-23 DIAGNOSIS — I5042 Chronic combined systolic (congestive) and diastolic (congestive) heart failure: Secondary | ICD-10-CM

## 2021-07-23 DIAGNOSIS — I4729 Other ventricular tachycardia: Secondary | ICD-10-CM

## 2021-07-23 DIAGNOSIS — Z95810 Presence of automatic (implantable) cardiac defibrillator: Secondary | ICD-10-CM | POA: Diagnosis not present

## 2021-07-23 NOTE — Progress Notes (Signed)
Cardiology Office Note:    Date:  07/29/2021   ID:  [name], DOB 1960/11/09, MRN [id number]  PCP:  Shelva Majestic, MD  Cardiologist:  Darrel Reach (CHF)  Referring MD: Shelva Majestic, MD   Chief Complaint  Patient presents with   ICD check     History of Present Illness:    John [name] is a 60 y.o. male with a hx of severe nonischemic cardiomyopathy (with left ventricular ejection fraction as low as 15-20%, most recently estimated to be 25-30 % by an echo 01/16/2021) returns in follow-up for single-chamber [facility] defibrillator for primary prevention.  He also has a history obstructive sleep apnea on CPAP and paroxysmal atrial fibrillation requiring cardioversion December 2020  He is doing well from a cardiovascular point of view, but he has gained weight.  He underwent a lumbar vertebroplasty and right total hip repair by Dr. Linna Caprice and Dr. Shon Baton respectively.  No cardiac issues during those surgeries.  The patient specifically denies any chest pain at rest or light exertion, dyspnea at rest or with exertion, orthopnea, paroxysmal nocturnal dyspnea, syncope, palpitations, focal neurological deficits, intermittent claudication, lower extremity edema, unexplained weight gain, cough, hemoptysis or wheezing.  NYHA functional class II.  Has not required recent diuretic dose adjustments.  Device function is normal.  Estimated generator longevity 6.2 years.  He does not require ventricular pacing.  He has not received any therapy from the device.  The device has recorded 2 episodes of "nonsustained VT".  I think these represent paroxysmal atrial tachycardia.  The intracardiac electrogram has identical morphology without during sinus rhythm.  Each episode starts off with a rate of about 150 bpm and gradually warms up to 180 bpm before stopping spontaneously.  There has been no evidence of sustained arrhythmia.  The episodes were asymptomatic. He has not had atrial  fibrillation.    He reports compliance with CPAP and denies daytime hypersomnolence.  He is on maximum dose Entresto and carvedilol and also takes [place] and spironolactone.    Past Medical History:  Diagnosis Date   AICD (automatic cardioverter/defibrillator) present    [employer]   Anxiety    Ativan   Chronic combined systolic and diastolic CHF, NYHA class 3 (HCC) 10/2016   Nonischemic cardiomyopathy. EF 20-25%.   Chronic left hip pain    Depression    history of   Dysrhythmia    A-FIB   GI bleed 03/2016   Headache    Hypertensive heart disease with combined systolic and diastolic congestive heart failure (HCC) 10/2016   Nonischemic cardiomyopathy (HCC) 10/2016   Echo with EF 20-25%. Cardiac catheterization with no CAD. LVEDP was 41 mmHg, PCWP 36 mmHg   Osteoarthritis    right shoulder   Right hand fracture    Seasonal allergies    Sleep apnea    wears a CPAP   Wears glasses     Past Surgical History:  Procedure Laterality Date   CARDIAC CATHETERIZATION     CARDIOVERSION N/A 09/01/2019   Procedure: CARDIOVERSION;  Surgeon: Laurey Morale, MD;  Location: [facility] ENDOSCOPY;  Service: Cardiovascular;  Laterality: N/A;   ESOPHAGOGASTRODUODENOSCOPY (EGD) WITH PROPOFOL N/A 03/20/2016   Procedure: ESOPHAGOGASTRODUODENOSCOPY (EGD) WITH PROPOFOL;  Surgeon: Charlott Rakes, MD;  Location: [facility] ENDOSCOPY;  Service: Endoscopy;  Laterality: N/A;   ICD IMPLANT N/A 05/06/2018   Procedure: ICD IMPLANT;  Surgeon: Thurmon Fair, MD;  Location: [facility] INVASIVE CV LAB;  Service: Cardiovascular;  Laterality: N/A;   IRRIGATION  AND DEBRIDEMENT SHOULDER Left 11/11/2019   Procedure: LEFT SHOULDER IRRIGATION AND DEBRIDEMENT WITH POLY EXCHANGE;  Surgeon: Jones Broom, MD;  Location: [facility] ORS;  Service: Orthopedics;  Laterality: Left;   KYPHOPLASTY N/A 02/22/2021   Procedure: KYPHOPLASTY T12;  Surgeon: Venita Lick, MD;  Location: [facility] OR;  Service: Orthopedics;  Laterality: N/A;  90 mins   RIGHT/LEFT HEART  CATH AND CORONARY ANGIOGRAPHY N/A 10/21/2016   Procedure: Right/Left Heart Cath and Coronary Angiography;  Surgeon: Runell Gess, MD;  Location: [facility] INVASIVE CV LAB;  Service: Cardiovascular: Angiographically normal coronary arteries. PCWP 33-36 mmHg, LVEDP 41 mmHg. PA pressure 60/35, mean 46 mmHg.  Cardiac output/cardiac index-3.93 /1.76 Hiram Comber), 3.49/1.57 (thermodilution)   TEE WITHOUT CARDIOVERSION N/A 09/01/2019   Procedure: TRANSESOPHAGEAL ECHOCARDIOGRAM (TEE);  Surgeon: Laurey Morale, MD;  Location: [facility] ENDOSCOPY;  Service: Cardiovascular;  Laterality: N/A;   TOTAL HIP ARTHROPLASTY Left 03/27/2015   Procedure: LEFT TOTAL HIP ARTHROPLASTY ANTERIOR APPROACH;  Surgeon: Samson Frederic, MD;  Location: [facility] OR;  Service: Orthopedics;  Laterality: Left;   TOTAL HIP ARTHROPLASTY Right 05/03/2021   Procedure: TOTAL HIP ARTHROPLASTY ANTERIOR APPROACH;  Surgeon: Samson Frederic, MD;  Location: [facility] ORS;  Service: Orthopedics;  Laterality: Right;   TOTAL SHOULDER ARTHROPLASTY Right 11/24/2015   Procedure: RIGHT TOTAL SHOULDER ARTHROPLASTY;  Surgeon: Beverely Low, MD;  Location: [facility] OR;  Service: Orthopedics;  Laterality: Right;   TOTAL SHOULDER ARTHROPLASTY Left 10/19/2019   Procedure: REVERSE TOTAL SHOULDER ARTHROPLASTY;  Surgeon: Jones Broom, MD;  Location: [facility] ORS;  Service: Orthopedics;  Laterality: Left;   TRANSTHORACIC ECHOCARDIOGRAM  10/2016   EF 20-25%. Diffuse hypokinesis but akinesis of the entire inferoseptal wall and apical wall. Moderate biatrial enlargement. PA pressure estimated 64 mmHg.   WISDOM TOOTH EXTRACTION      Current Medications: Current Meds  Medication Sig   acetaminophen (TYLENOL) 325 MG tablet Take 1-2 tablets (325-650 mg total) by mouth every 6 (six) hours as needed for mild pain (pain score 1-3 or temp > 100.5).   diphenhydramine-acetaminophen (TYLENOL PM) 25-500 MG TABS tablet Take 2 tablets by mouth at bedtime.   ELIQUIS 5 MG TABS tablet TAKE 1 TABLET BY MOUTH TWICE A DAY    ENTRESTO 97-103 MG TAKE 1 TABLET BY MOUTH 2 (TWO) TIMES DAILY.   escitalopram (LEXAPRO) 10 MG tablet Take 1 tablet (10 mg total) by mouth daily.   FARXIGA 10 MG TABS tablet TAKE 1 TABLET BY MOUTH DAILY BEFORE BREAKFAST   rosuvastatin (CRESTOR) 20 MG tablet TAKE 1 TABLET BY MOUTH EVERY DAY   sildenafil (VIAGRA) 100 MG tablet TAKE 0.5 TABLETS (50 MG TOTAL) BY MOUTH AT BEDTIME AS NEEDED FOR ERECTILE DYSFUNCTION.   spironolactone (ALDACTONE) 25 MG tablet TAKE 1 TABLET BY MOUTH EVERY DAY   torsemide (DEMADEX) 20 MG tablet TAKE 3 TABLETS BY MOUTH EVERY DAY   traZODone (DESYREL) 50 MG tablet TAKE 1 TABLET BY MOUTH EVERY DAY AT BEDTIME AS NEEDED     Allergies:   Patient has no known allergies.   Social History   Socioeconomic History   Marital status: Divorced    Spouse name: Not on file   Number of children: 3   Years of education: Not on file   Highest education level: Not on file  Occupational History   Not on file  Tobacco Use   Smoking status: Never   Smokeless tobacco: Never  Vaping Use   Vaping Use: Never used  Substance and Sexual Activity   Alcohol use: Yes  Alcohol/week: 12.0 standard drinks    Types: 12 Cans of beer per week    Comment: per week   Drug use: No   Sexual activity: Yes    Partners: Female    Comment: gf only  Other Topics Concern   Not on file  Social History Narrative   GF that lives with him. 1 dog. Close with 3 sons that work for wife- christopher 15 at [employer], [place] 21, [place] 25 in 2021.       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    Grew up in [place].    Social Determinants of Health   Financial Resource Strain: Not on file  Food Insecurity: Not on file  Transportation Needs: Not on file  Physical Activity: Not on file  Stress: Not on file  Social Connections: Not on file     Family History: The patient's family history includes Congenital heart disease in  his sister; Dementia in his mother; Healthy in his brother, brother, brother, and sister; Lung cancer in his father; Prostate cancer in his brother.  His son also had a "hole in his heart" there was repaired surgically.  ROS:   Please see the history of present illness.    All other systems are reviewed and are negative  EKGs/Labs/Other Studies Reviewed:    The following studies were reviewed today: Notes from last heart failure visit with heart failure service from May 2022  EKG:  EKG is ordered today.  Shows sinus rhythm with PVCs and generalized low voltage arrest, nonspecific ST-T wave changes, QTC 467 ms  Recent Labs: 02/16/2021: B Natriuretic Peptide 437.3 05/03/2021: ALT 24; Magnesium 2.0 05/04/2021: BUN 18; Creatinine, Ser 1.36; Hemoglobin 9.8; Platelets 202; Potassium 4.8; Sodium 135  Recent Lipid Panel    Component Value Date/Time   CHOL 153 08/24/2020 1004   CHOL 111 06/09/2018 1137   TRIG 163 (H) 08/24/2020 1004   HDL 58 08/24/2020 1004   HDL 53 06/09/2018 1137   CHOLHDL 2.6 08/24/2020 1004   VLDL 79 (H) 03/30/2020 1301   LDLCALC 71 08/24/2020 1004    Physical Exam:    VS:  BP 130/70 (BP Location: Left Arm, Patient Position: Sitting, Cuff Size: Large)   Pulse 79   Ht 5\' 11"  (1.803 m)   Wt 265 lb (120.2 kg)   SpO2 94%   BMI 36.96 kg/m     Wt Readings from Last 3 Encounters:  07/23/21 265 lb (120.2 kg)  05/03/21 170 lb 6.4 oz (77.3 kg)  04/30/21 170 lb 6.4 oz (77.3 kg)      General: Alert, oriented x3, no distress, severely obese.  Healthy left subclavian defibrillator site. Head: no evidence of trauma, PERRL, EOMI, no exophtalmos or lid lag, no myxedema, no xanthelasma; normal ears, nose and oropharynx Neck: normal jugular venous pulsations and no hepatojugular reflux; brisk carotid pulses without delay and no carotid bruits Chest: clear to auscultation, no signs of consolidation by percussion or palpation, normal fremitus, symmetrical and full respiratory  excursions Cardiovascular: normal position and quality of the apical impulse, regular rhythm, normal first and second heart sounds, no murmurs, rubs or gallops Abdomen: no tenderness or distention, no masses by palpation, no abnormal pulsatility or arterial bruits, normal bowel sounds, no hepatosplenomegaly Extremities: no clubbing, cyanosis or edema; 2+ radial, ulnar and brachial pulses bilaterally; 2+ right femoral, posterior tibial and dorsalis pedis pulses; 2+ left femoral, posterior tibial and dorsalis pedis pulses; no subclavian  or femoral bruits Neurological: grossly nonfocal Psych: Normal mood and affect    ASSESSMENT:    1. Chronic combined systolic (congestive) and diastolic (congestive) heart failure (HCC)   2. S/P ICD (internal cardiac defibrillator) procedure   3. NSVT (nonsustained ventricular tachycardia)   4. Severe obesity (BMI 35.0-39.9) with comorbidity (HCC)     PLAN:    In order of problems listed above:  CHF: NYHA functional class II on excellent medical therapy.  Appears clinically euvolemic today. ICD: Normal device function.  Continue downloads every 3 months. NSVT: Episodes of high ventricular rate seen recently appear to actually represent paroxysmal atrial tachycardia.  He has not received therapy from his device. Obesity: Discussed ways to limit overall calories, especially carbohydrates and in particular carbs with high glycemic index.  He is to start exercising more.   Medication Adjustments/Labs and Tests Ordered: Current medicines are reviewed at length with the patient today.  Concerns regarding medicines are outlined above.  Orders Placed This Encounter  Procedures   EKG 12-Lead    No orders of the defined types were placed in this encounter.   Patient Instructions  Medication Instructions:  No changes *If you need a refill on your cardiac medications before your next appointment, please call your pharmacy*   Lab Work: None ordered If  you have labs (blood work) drawn today and your tests are completely normal, you will receive your results only by: MyChart Message (if you have MyChart) OR A paper copy in the mail If you have any lab test that is abnormal or we need to change your treatment, we will call you to review the results.   Testing/Procedures: None ordered   Follow-Up: At [facility], you and your health needs are our priority.  As part of our continuing mission to provide you with exceptional heart care, we have created designated Provider Care Teams.  These Care Teams include your primary Cardiologist (physician) and Advanced Practice Providers (APPs -  Physician Assistants and Nurse Practitioners) who all work together to provide you with the care you need, when you need it.  We recommend signing up for the patient portal called "MyChart".  Sign up information is provided on this After Visit Summary.  MyChart is used to connect with patients for Virtual Visits (Telemedicine).  Patients are able to view lab/test results, encounter notes, upcoming appointments, etc.  Non-urgent messages can be sent to your provider as well.   To learn more about what you can do with MyChart, go to [email or web address].    Your next appointment:   12 month(s)  The format for your next appointment:   In Person  Provider:   Thurmon Fair, MD If MD is not listed, click here to update    :1}      Signed, Thurmon Fair, MD  07/29/2021 3:35 PM    [facility]

## 2021-07-23 NOTE — Patient Instructions (Signed)
Medication Instructions:  No changes *If you need a refill on your cardiac medications before your next appointment, please call your pharmacy*   Lab Work: None ordered If you have labs (blood work) drawn today and your tests are completely normal, you will receive your results only by: MyChart Message (if you have MyChart) OR A paper copy in the mail If you have any lab test that is abnormal or we need to change your treatment, we will call you to review the results.   Testing/Procedures: None ordered   Follow-Up: At [facility], you and your health needs are our priority.  As part of our continuing mission to provide you with exceptional heart care, we have created designated Provider Care Teams.  These Care Teams include your primary Cardiologist (physician) and Advanced Practice Providers (APPs -  Physician Assistants and Nurse Practitioners) who all work together to provide you with the care you need, when you need it.  We recommend signing up for the patient portal called "MyChart".  Sign up information is provided on this After Visit Summary.  MyChart is used to connect with patients for Virtual Visits (Telemedicine).  Patients are able to view lab/test results, encounter notes, upcoming appointments, etc.  Non-urgent messages can be sent to your provider as well.   To learn more about what you can do with MyChart, go to [email or web address].    Your next appointment:   12 month(s)  The format for your next appointment:   In Person  Provider:   Thurmon Fair, MD If MD is not listed, click here to update    :1}

## 2021-07-26 ENCOUNTER — Other Ambulatory Visit (HOSPITAL_COMMUNITY): Payer: Self-pay | Admitting: Orthopedic Surgery

## 2021-07-26 ENCOUNTER — Other Ambulatory Visit (HOSPITAL_BASED_OUTPATIENT_CLINIC_OR_DEPARTMENT_OTHER): Payer: Self-pay | Admitting: Orthopedic Surgery

## 2021-07-26 DIAGNOSIS — M5459 Other low back pain: Secondary | ICD-10-CM

## 2021-07-28 ENCOUNTER — Other Ambulatory Visit (HOSPITAL_COMMUNITY): Payer: Self-pay | Admitting: Cardiology

## 2021-07-29 ENCOUNTER — Encounter: Payer: Self-pay | Admitting: Cardiovascular Disease

## 2021-07-31 ENCOUNTER — Ambulatory Visit (INDEPENDENT_AMBULATORY_CARE_PROVIDER_SITE_OTHER): Payer: Self-pay

## 2021-07-31 DIAGNOSIS — I428 Other cardiomyopathies: Secondary | ICD-10-CM

## 2021-07-31 LAB — CUP PACEART REMOTE DEVICE CHECK
Battery Remaining Longevity: 74 mo
Battery Remaining Percentage: 72 %
Battery Voltage: 2.98 V
Brady Statistic RV Percent Paced: 1 %
Date Time Interrogation Session: 20221115020015
HighPow Impedance: 84 Ohm
HighPow Impedance: 84 Ohm
Lead Channel Impedance Value: 490 Ohm
Lead Channel Pacing Threshold Amplitude: 0.5 V
Lead Channel Pacing Threshold Pulse Width: 0.5 ms
Lead Channel Sensing Intrinsic Amplitude: 11.8 mV
Lead Channel Setting Pacing Amplitude: 2.5 V
Lead Channel Setting Pacing Pulse Width: 0.5 ms
Lead Channel Setting Sensing Sensitivity: 0.5 mV
Pulse Gen Serial Number: 9824800

## 2021-08-08 NOTE — Progress Notes (Signed)
Remote ICD transmission.   

## 2021-08-27 ENCOUNTER — Ambulatory Visit (INDEPENDENT_AMBULATORY_CARE_PROVIDER_SITE_OTHER): Payer: 59 | Admitting: Family

## 2021-08-27 ENCOUNTER — Other Ambulatory Visit: Payer: Self-pay

## 2021-08-27 ENCOUNTER — Encounter: Payer: Self-pay | Admitting: Family

## 2021-08-27 VITALS — BP 134/84 | HR 77 | Temp 97.9°F | Ht 71.0 in | Wt 272.8 lb

## 2021-08-27 DIAGNOSIS — R053 Chronic cough: Secondary | ICD-10-CM | POA: Diagnosis not present

## 2021-08-27 DIAGNOSIS — H1031 Unspecified acute conjunctivitis, right eye: Secondary | ICD-10-CM | POA: Insufficient documentation

## 2021-08-27 DIAGNOSIS — J309 Allergic rhinitis, unspecified: Secondary | ICD-10-CM

## 2021-08-27 MED ORDER — PREDNISONE 20 MG PO TABS: ORAL_TABLET | ORAL | 0 refills | Status: DC

## 2021-08-27 MED ORDER — FLUTICASONE PROPIONATE 50 MCG/ACT NA SUSP: 1.0000 | Freq: Every day | NASAL | 1 refills | Status: DC

## 2021-08-27 MED ORDER — POLYMYXIN B-TRIMETHOPRIM 10000-0.1 UNIT/ML-% OP SOLN: 1.0000 [drp] | OPHTHALMIC | 0 refills | Status: DC

## 2021-08-27 NOTE — Assessment & Plan Note (Addendum)
Reports having for a month, about same time he had his hip surgery. States occasionally he will have clear phlegm, but mostly it is a dry hacking cough. Has postnasal drip with runny nose and right eye infection also. Starting prednisone, eye drops, and Flonase, pt advised on use & SE.

## 2021-08-27 NOTE — Patient Instructions (Signed)
It was very nice to see you today!  As discussed, I have sent generic Flonase, a steroid nasal spray and prednisone to your pharmacy. Start these today. Call back if your symptoms are still not resolving. After you finish the prednisone (5 days) schedule a clinical visit to get your Flu and Prevnar 20 vaccines.   PLEASE NOTE:  If you had any lab tests please let us know if you have not heard back within a few days. You may see your results on MyChart before we have a chance to review them but we will give you a call once they are reviewed by [place]. If we ordered any referrals today, please let us know if you have not heard from their office within the next week.   Please try these tips to maintain a healthy lifestyle:  Eat most of your calories during the day when you are active. Eliminate processed foods including packaged sweets (pies, cakes, cookies), reduce intake of potatoes, white bread, white pasta, and white rice. Look for whole grain options, oat flour or almond flour.  Each meal should contain half fruits/vegetables, one quarter protein, and one quarter carbs (no bigger than a computer mouse).  Cut down on sweet beverages. This includes juice, soda, and sweet tea. Also watch fruit intake, though this is a healthier sweet option, it still contains natural sugar! Limit to 3 servings daily.  Drink at least 1 glass of water with each meal and aim for at least 8 glasses per day  Exercise at least 150 minutes every week.

## 2021-08-27 NOTE — Progress Notes (Signed)
Subjective:     Patient ID: John [name], male    DOB: [birth year], 60 y.o.   MRN: [id number]  Chief Complaint  Patient presents with   Cough    Dry; Ongoing for about 1 month. Clear phlem. He denies sore throat and fever. He has not taken anything for symptoms.   Nasal Congestion         HPI: Upper Respiratory Infection: Symptoms include runny nose, congestion, no  fever, non productive cough, and post nasal drip.  Onset of symptoms was 1 month ago, unchanged since that time. He is drinking moderate amounts of fluids. Evaluation to date: none. Treatment to date: none. Eye irritation:  Pt complains of watery discharge, redness. Pt denies pain, itching, matting. Symptoms started 2 weeks ago. Symptoms are located in  right eye. Pt has not been exposed to someone with confirmed conjunctivitis. Pt does also have other sinus symptoms.    Health Maintenance Due  Topic Date Due   Zoster Vaccines- Shingrix (1 of 2) Never done   COVID-19 Vaccine (4 - Booster for Pfizer series) 07/14/2020   INFLUENZA VACCINE  04/16/2021    Past Medical History:  Diagnosis Date   AICD (automatic cardioverter/defibrillator) present    St. Jude   Anxiety    Ativan   Chronic combined systolic and diastolic CHF, NYHA class 3 (HCC) 10/2016   Nonischemic cardiomyopathy. EF 20-25%.   Chronic left hip pain    Depression    history of   Dysrhythmia    A-FIB   GI bleed 03/2016   Headache    Hypertensive heart disease with combined systolic and diastolic congestive heart failure (HCC) 10/2016   Nonischemic cardiomyopathy (HCC) 10/2016   Echo with EF 20-25%. Cardiac catheterization with no CAD. LVEDP was 41 mmHg, PCWP 36 mmHg   Osteoarthritis    right shoulder   Right hand fracture    Seasonal allergies    Sleep apnea    wears a CPAP   Wears glasses     Past Surgical History:  Procedure Laterality Date   CARDIAC CATHETERIZATION     CARDIOVERSION N/A 09/01/2019   Procedure: CARDIOVERSION;   Surgeon: Laurey Morale, MD;  Location: [facility] ENDOSCOPY;  Service: Cardiovascular;  Laterality: N/A;   ESOPHAGOGASTRODUODENOSCOPY (EGD) WITH PROPOFOL N/A 03/20/2016   Procedure: ESOPHAGOGASTRODUODENOSCOPY (EGD) WITH PROPOFOL;  Surgeon: Charlott Rakes, MD;  Location: [facility] ENDOSCOPY;  Service: Endoscopy;  Laterality: N/A;   ICD IMPLANT N/A 05/06/2018   Procedure: ICD IMPLANT;  Surgeon: Thurmon Fair, MD;  Location: [facility] INVASIVE CV LAB;  Service: Cardiovascular;  Laterality: N/A;   IRRIGATION AND DEBRIDEMENT SHOULDER Left 11/11/2019   Procedure: LEFT SHOULDER IRRIGATION AND DEBRIDEMENT WITH POLY EXCHANGE;  Surgeon: Jones Broom, MD;  Location: [facility] ORS;  Service: Orthopedics;  Laterality: Left;   KYPHOPLASTY N/A 02/22/2021   Procedure: KYPHOPLASTY T12;  Surgeon: Venita Lick, MD;  Location: [facility] OR;  Service: Orthopedics;  Laterality: N/A;  90 mins   RIGHT/LEFT HEART CATH AND CORONARY ANGIOGRAPHY N/A 10/21/2016   Procedure: Right/Left Heart Cath and Coronary Angiography;  Surgeon: Runell Gess, MD;  Location: [facility] INVASIVE CV LAB;  Service: Cardiovascular: Angiographically normal coronary arteries. PCWP 33-36 mmHg, LVEDP 41 mmHg. PA pressure 60/35, mean 46 mmHg.  Cardiac output/cardiac index-3.93 /1.76 Hiram Comber), 3.49/1.57 (thermodilution)   TEE WITHOUT CARDIOVERSION N/A 09/01/2019   Procedure: TRANSESOPHAGEAL ECHOCARDIOGRAM (TEE);  Surgeon: Laurey Morale, MD;  Location: [facility] ENDOSCOPY;  Service: Cardiovascular;  Laterality: N/A;   TOTAL HIP ARTHROPLASTY Left 03/27/2015  Procedure: LEFT TOTAL HIP ARTHROPLASTY ANTERIOR APPROACH;  Surgeon: Samson Frederic, MD;  Location: [facility] OR;  Service: Orthopedics;  Laterality: Left;   TOTAL HIP ARTHROPLASTY Right 05/03/2021   Procedure: TOTAL HIP ARTHROPLASTY ANTERIOR APPROACH;  Surgeon: Samson Frederic, MD;  Location: [facility] ORS;  Service: Orthopedics;  Laterality: Right;   TOTAL SHOULDER ARTHROPLASTY Right 11/24/2015   Procedure: RIGHT TOTAL SHOULDER ARTHROPLASTY;  Surgeon:  Beverely Low, MD;  Location: [facility] OR;  Service: Orthopedics;  Laterality: Right;   TOTAL SHOULDER ARTHROPLASTY Left 10/19/2019   Procedure: REVERSE TOTAL SHOULDER ARTHROPLASTY;  Surgeon: Jones Broom, MD;  Location: [facility] ORS;  Service: Orthopedics;  Laterality: Left;   TRANSTHORACIC ECHOCARDIOGRAM  10/2016   EF 20-25%. Diffuse hypokinesis but akinesis of the entire inferoseptal wall and apical wall. Moderate biatrial enlargement. PA pressure estimated 64 mmHg.   WISDOM TOOTH EXTRACTION      Outpatient Medications Prior to Visit  Medication Sig Dispense Refill   acetaminophen (TYLENOL) 325 MG tablet Take 1-2 tablets (325-650 mg total) by mouth every 6 (six) hours as needed for mild pain (pain score 1-3 or temp > 100.5).     diphenhydramine-acetaminophen (TYLENOL PM) 25-500 MG TABS tablet Take 2 tablets by mouth at bedtime.     ELIQUIS 5 MG TABS tablet TAKE 1 TABLET BY MOUTH TWICE A DAY 180 tablet 3   ENTRESTO 97-103 MG TAKE 1 TABLET BY MOUTH 2 (TWO) TIMES DAILY. 60 tablet 5   escitalopram (LEXAPRO) 10 MG tablet Take 1 tablet (10 mg total) by mouth daily. 90 tablet 2   FARXIGA 10 MG TABS tablet TAKE 1 TABLET BY MOUTH DAILY BEFORE BREAKFAST 30 tablet 6   rosuvastatin (CRESTOR) 20 MG tablet TAKE 1 TABLET BY MOUTH EVERY DAY 90 tablet 3   sildenafil (VIAGRA) 100 MG tablet TAKE 0.5 TABLETS (50 MG TOTAL) BY MOUTH AT BEDTIME AS NEEDED FOR ERECTILE DYSFUNCTION. 10 tablet 3   spironolactone (ALDACTONE) 25 MG tablet TAKE 1 TABLET BY MOUTH EVERY DAY 30 tablet 11   torsemide (DEMADEX) 20 MG tablet TAKE 3 TABLETS BY MOUTH EVERY DAY 270 tablet 1   traZODone (DESYREL) 50 MG tablet TAKE 1 TABLET BY MOUTH EVERY DAY AT BEDTIME AS NEEDED 90 tablet 3   docusate sodium (COLACE) 100 MG capsule Take 1 capsule (100 mg total) by mouth 2 (two) times daily. (Patient not taking: Reported on 07/23/2021) 10 capsule 0   meloxicam (MOBIC) 15 MG tablet Take 15 mg by mouth daily as needed.     bismuth subsalicylate (PEPTO BISMOL)  262 MG/15ML suspension Take 30 mLs by mouth every 6 (six) hours as needed for indigestion. (Patient not taking: Reported on 07/23/2021)     carvedilol (COREG) 25 MG tablet TAKE 1 TABLET (25 MG TOTAL) BY MOUTH 2 (TWO) TIMES DAILY WITH A MEAL. (Patient not taking: Reported on 07/23/2021) 180 tablet 1   clobetasol (TEMOVATE) 0.05 % external solution Apply 1 application topically daily as needed (rash). (Patient not taking: Reported on 07/23/2021)     LORazepam (ATIVAN) 0.5 MG tablet Take 0.5-1 mg by mouth daily as needed for anxiety. (Patient not taking: Reported on 07/23/2021)     Multiple Vitamin (MULTIVITAMIN ADULT PO) Take 1 tablet by mouth daily. (Patient not taking: Reported on 07/23/2021)     ondansetron (ZOFRAN) 4 MG tablet Take 1 tablet (4 mg total) by mouth every 6 (six) hours as needed for nausea. (Patient not taking: Reported on 07/23/2021) 20 tablet 0   senna (SENOKOT) 8.6 MG TABS tablet Take  1 tablet (8.6 mg total) by mouth 2 (two) times daily. (Patient not taking: Reported on 07/23/2021) 120 tablet 0   No facility-administered medications prior to visit.    No Known Allergies      Objective:    Physical Exam Vitals and nursing note reviewed.  Constitutional:      General: He is not in acute distress.    Appearance: Normal appearance.  HENT:     Head: Normocephalic.     Right Ear: Tympanic membrane and ear canal normal.     Left Ear: Tympanic membrane and ear canal normal.     Mouth/Throat:     Mouth: Mucous membranes are moist.     Pharynx: Oropharyngeal exudate present. No pharyngeal swelling or posterior oropharyngeal erythema.     Tonsils: No tonsillar exudate or tonsillar abscesses.  Eyes:     General:        Right eye: Discharge (watery) present.     Conjunctiva/sclera:     Right eye: Right conjunctiva is injected.  Cardiovascular:     Rate and Rhythm: Normal rate and regular rhythm.  Pulmonary:     Effort: Pulmonary effort is normal.     Breath sounds: Normal breath  sounds.  Musculoskeletal:        General: Normal range of motion.     Cervical back: Normal range of motion.  Skin:    General: Skin is warm and dry.  Neurological:     Mental Status: He is alert and oriented to person, place, and time.  Psychiatric:        Mood and Affect: Mood normal.    BP 134/84   Pulse 77   Temp 97.9 F (36.6 C) (Temporal)   Ht 5\' 11"  (1.803 m)   Wt 272 lb 12.8 oz (123.7 kg)   SpO2 98%   BMI 38.05 kg/m  Wt Readings from Last 3 Encounters:  08/27/21 272 lb 12.8 oz (123.7 kg)  07/23/21 265 lb (120.2 kg)  05/03/21 170 lb 6.4 oz (77.3 kg)       Assessment & Plan:   Problem List Items Addressed This Visit       Respiratory   Allergic rhinitis    Reports having continuous runny nose, postnasal drip, dry hacking cough for a month, has not taken anything OTC, just drinking water. Starting Flonase bid x 1 week, decrease to qd, pt advised on use & SE.      Relevant Medications   fluticasone (FLONASE) 50 MCG/ACT nasal spray     Other   Persistent cough - Primary    Reports having for a month, about same time he had his hip surgery. States occasionally he will have clear phlegm, but mostly it is a dry hacking cough. Has postnasal drip with runny nose and right eye infection also. Starting prednisone, eye drops, and Flonase, pt advised on use & SE.      Relevant Medications   predniSONE (DELTASONE) 20 MG tablet   Acute conjunctivitis of right eye    Denies pain or matting, injected, watery, having persistent cough, postnasal drip, left eye with no sx, treating as bacterial conjunctivitis, also starting Flonase.      Relevant Medications   trimethoprim-polymyxin b (POLYTRIM) ophthalmic solution    Meds ordered this encounter  Medications   fluticasone (FLONASE) 50 MCG/ACT nasal spray    Sig: Place 1 spray into both nostrils daily. START with 1 spray each nostril twice a day for 1 week, then back off  to daily use.    Dispense:  16 g    Refill:  1     Order Specific Question:   Supervising Provider    Answer:   ANDY, CAMILLE L [[id number]]   predniSONE (DELTASONE) 20 MG tablet    Sig: Take 2 pills in the morning with breakfast for 3 days, then 1 pill for 2 days    Dispense:  8 tablet    Refill:  0    Order Specific Question:   Supervising Provider    Answer:   ANDY, CAMILLE L [[id number]]   trimethoprim-polymyxin b (POLYTRIM) ophthalmic solution    Sig: Place 1 drop into the right eye every 4 (four) hours.    Dispense:  10 mL    Refill:  0    Order Specific Question:   Supervising Provider    Answer:   ANDY, CAMILLE L [[id number]]

## 2021-08-27 NOTE — Assessment & Plan Note (Signed)
Denies pain or matting, injected, watery, having persistent cough, postnasal drip, left eye with no sx, treating as bacterial conjunctivitis, also starting Flonase.

## 2021-08-27 NOTE — Assessment & Plan Note (Signed)
Reports having continuous runny nose, postnasal drip, dry hacking cough for a month, has not taken anything OTC, just drinking water. Starting Flonase bid x 1 week, decrease to qd, pt advised on use & SE.

## 2021-09-03 ENCOUNTER — Ambulatory Visit (INDEPENDENT_AMBULATORY_CARE_PROVIDER_SITE_OTHER): Payer: 59 | Admitting: *Deleted

## 2021-09-03 DIAGNOSIS — Z23 Encounter for immunization: Secondary | ICD-10-CM | POA: Diagnosis not present

## 2021-09-03 NOTE — Progress Notes (Signed)
Pt here for Flu and prevnar 20 vaccines per  Dulce Sellar, NP. Allergies reviewed, vaccines given. Pt tolerated well.

## 2021-09-04 ENCOUNTER — Ambulatory Visit: Payer: 59

## 2021-09-12 ENCOUNTER — Other Ambulatory Visit (HOSPITAL_COMMUNITY): Payer: Self-pay | Admitting: Cardiology

## 2021-09-17 ENCOUNTER — Other Ambulatory Visit (HOSPITAL_COMMUNITY): Payer: Self-pay | Admitting: Cardiology

## 2021-09-23 ENCOUNTER — Other Ambulatory Visit (HOSPITAL_COMMUNITY): Payer: Self-pay | Admitting: Cardiology

## 2021-09-24 ENCOUNTER — Other Ambulatory Visit: Payer: Self-pay

## 2021-09-24 ENCOUNTER — Encounter (HOSPITAL_COMMUNITY): Payer: Self-pay | Admitting: Cardiology

## 2021-09-24 ENCOUNTER — Ambulatory Visit (HOSPITAL_COMMUNITY)
Admission: RE | Admit: 2021-09-24 | Discharge: 2021-09-24 | Disposition: A | Discharge status: Home / Self Care | Payer: BLUE CROSS/BLUE SHIELD | Source: Ambulatory Visit | Attending: Cardiology | Admitting: Cardiology

## 2021-09-24 VITALS — BP 138/86 | HR 80 | Ht 71.0 in | Wt 270.0 lb

## 2021-09-24 DIAGNOSIS — Z95810 Presence of automatic (implantable) cardiac defibrillator: Secondary | ICD-10-CM | POA: Diagnosis not present

## 2021-09-24 DIAGNOSIS — Z7984 Long term (current) use of oral hypoglycemic drugs: Secondary | ICD-10-CM | POA: Insufficient documentation

## 2021-09-24 DIAGNOSIS — F1021 Alcohol dependence, in remission: Secondary | ICD-10-CM

## 2021-09-24 DIAGNOSIS — Z91198 Patient's noncompliance with other medical treatment and regimen for other reason: Secondary | ICD-10-CM | POA: Insufficient documentation

## 2021-09-24 DIAGNOSIS — E785 Hyperlipidemia, unspecified: Secondary | ICD-10-CM | POA: Diagnosis not present

## 2021-09-24 DIAGNOSIS — I5042 Chronic combined systolic (congestive) and diastolic (congestive) heart failure: Secondary | ICD-10-CM

## 2021-09-24 DIAGNOSIS — F411 Generalized anxiety disorder: Secondary | ICD-10-CM

## 2021-09-24 DIAGNOSIS — I5022 Chronic systolic (congestive) heart failure: Secondary | ICD-10-CM | POA: Diagnosis present

## 2021-09-24 DIAGNOSIS — Z7901 Long term (current) use of anticoagulants: Secondary | ICD-10-CM | POA: Insufficient documentation

## 2021-09-24 DIAGNOSIS — I480 Paroxysmal atrial fibrillation: Secondary | ICD-10-CM | POA: Diagnosis not present

## 2021-09-24 DIAGNOSIS — I5082 Biventricular heart failure: Secondary | ICD-10-CM | POA: Insufficient documentation

## 2021-09-24 DIAGNOSIS — F419 Anxiety disorder, unspecified: Secondary | ICD-10-CM | POA: Diagnosis not present

## 2021-09-24 DIAGNOSIS — I428 Other cardiomyopathies: Secondary | ICD-10-CM

## 2021-09-24 DIAGNOSIS — F1010 Alcohol abuse, uncomplicated: Secondary | ICD-10-CM

## 2021-09-24 DIAGNOSIS — G4733 Obstructive sleep apnea (adult) (pediatric): Secondary | ICD-10-CM | POA: Diagnosis not present

## 2021-09-24 DIAGNOSIS — Z79899 Other long term (current) drug therapy: Secondary | ICD-10-CM | POA: Insufficient documentation

## 2021-09-24 DIAGNOSIS — M545 Low back pain, unspecified: Secondary | ICD-10-CM | POA: Diagnosis not present

## 2021-09-24 LAB — BASIC METABOLIC PANEL
Anion gap: 11 (ref 5–15)
BUN: 12 mg/dL (ref 6–20)
CO2: 26 mmol/L (ref 22–32)
Calcium: 9.4 mg/dL (ref 8.9–10.3)
Chloride: 98 mmol/L (ref 98–111)
Creatinine, Ser: 1.13 mg/dL (ref 0.61–1.24)
GFR, Estimated: >60 mL/min (ref >60)
Glucose, Bld: 110 mg/dL — ABNORMAL HIGH (ref 70–99)
Potassium: 4.1 mmol/L (ref 3.5–5.1)
Sodium: 135 mmol/L (ref 135–145)

## 2021-09-24 LAB — LIPID PANEL
Cholesterol: 138 mg/dL (ref 0–200)
HDL: 58 mg/dL (ref >40)
LDL Cholesterol: 58 mg/dL (ref 0–99)
Total CHOL/HDL Ratio: 2.4 RATIO
Triglycerides: 112 mg/dL (ref <150)
VLDL: 22 mg/dL (ref 0–40)

## 2021-09-24 LAB — CBC
HCT: 37.7 % — ABNORMAL LOW (ref 39.0–52.0)
Hemoglobin: 12.8 g/dL — ABNORMAL LOW (ref 13.0–17.0)
MCH: 33.1 pg (ref 26.0–34.0)
MCHC: 34 g/dL (ref 30.0–36.0)
MCV: 97.4 fL (ref 80.0–100.0)
Platelets: 264 10*3/uL (ref 150–400)
RBC: 3.87 MIL/uL — ABNORMAL LOW (ref 4.22–5.81)
RDW: 13.6 % (ref 11.5–15.5)
WBC: 9.1 10*3/uL (ref 4.0–10.5)
nRBC: 0 % (ref 0.0–0.2)

## 2021-09-24 MED ORDER — TRAZODONE HCL 50 MG PO TABS: ORAL_TABLET | ORAL | 1 refills | Status: DC

## 2021-09-24 NOTE — Progress Notes (Signed)
PCP: Dr. Caprice Beaver Cardiology: Dr. Gwenlyn Found HF Cardiology: Dr. Aundra Dubin  61 y.o. with history of nonischemic cardiomyopathy and OSA was referred by Dr. Gwenlyn Found for evaluation of CHF.  He was initially diagnosed in early 2/18.  He had URI symptoms then developed dyspnea and put on weight. Echo was done in 2/18, showing EF 20-25%.  LHC in 2/18 showed normal coronaries. He was started on treatment for cardiomyopathy, and EF rose to 40% by 6/18. However, echo in 6/19 showed EF back down to 15-20% with severe RV dilation/dysfunction.  CPX was done in 7/19, showed moderate functional limitation though submaximal.  He had St Jude ICD placed in 8/19.   Atrial fibrillation noted in 12/20, he had TEE-guided DCCV in 12/20.  He remains in NSR today and has not felt palpitations.  EF on TEE was 25-30%.   He has been a heavy drinker but has cut back.  Still drinking on occasion, however.   He had left shoulder replacement in 2/21.  He developed a surgical site infection that is now resolved.    Echo in 4/22 showed EF 25-30%, mild LV dilation, normal RV.   He had THR in 8/22.   Patient returns for followup of CHF.  Not using CPAP, needs new machine.  Sleeping poorly and out of trazodone.  His PCP stopped lorazepam.  He says that he is more anxious and is self-medicating with ETOH.  Drinks at least 6 beers/day.  Weight stable.  Main complaint besides anxiety is low back pain.  He is not able to exercise much due to back pain and has not lost any weight.  Scheduled to get MRI of back.  No dyspnea walking up stairs or walking on flat ground.  No orthopnea/PND.  No chest pain.   ECG (personally reviewed): NSR, PVC  Labs (7/19): K 4.9, creatinine 0.91 Labs (8/19): Na 128, K 4.9, creatinine 0.72 Labs (9/19): LDL 45 Labs (12/19): K 3.9, creatinine 0.94  Labs (1/20): K 4.5, creatinine 0.94, hgb 13.2 Labs (10/20): K 4.4, creatinine 0.99 Labs (12/20): K 4.6, creatinine 1.0 Labs (2/21): K 3.9, creatinine 0.86 Labs (4/21):  K 4.9, creatinine 1.05 Labs (6/21): K 4, creatinine 0.94 Labs (12/21): K 4.6, creatinine 1.0, LFTs normal, hgb 14.1, TGs 163, LDL 71 Labs (2/22): K 3.29m, creatinine 0.91, BNP 1130 Labs (8/22): K 4.8, creatinine 1.36  PMH: 1. OSA: uses CPAP.  2. Depression 3. Chronic systolic CHF: Nonischemic cardiomyopathy.  Diagnosed around 2/18.  - LHC (2/18): Normal coronaries.  - Echo (2/18): EF 20-25% - Echo (6/18): EF 40% - Echo (12/18): EF 35-40% - Echo (6/19): LV mildly dilated, EF 15-20%, RV severely dilated with severely decreased systolic function.  - CPX (7/19): peak VO2 16, VE/VCO2 slope 37, RER 1.03 => submaximal but probably moderate functional limitation.  - Cardiac MRI (7/19): EF 29% with mild LV dilation, mildly decreased RV systolic function EF [id number], mid-wall LGE throughtout the septal wall and the inferor wall.  - St Jude ICD placed in 8/19.  - Echo (12/19): EF 25-30%, diffuse hypokinesis, normal RV size and systolic function.  - Echo (12/20): EF 25-30%, moderate LVH, normal RV size and systolic function.  - TEE (12/20): EF 25-30%, mildly decreased RV systolic function.  - Echo (4/22): EF 25-30%, mild LV dilation, normal RV. 4. ETOH abuse.  5. Atrial fibrillation: First noted in 12/20.  - DCCV to NSR in 12/20.  6. Left shoulder OA 7. THR 8/22  SH: Originally from [place], divorced with 3 kids,  works in sales/logistics, never smoked, h/o ETOH abuse. No drugs. Lives in [place].   FH: Sister with congenital heart abnormality.  No other cardiac disease.   ROS: All systems reviewed and negative except as per HPI.  Current Outpatient Medications  Medication Sig Dispense Refill   acetaminophen (TYLENOL) 325 MG tablet Take 1-2 tablets (325-650 mg total) by mouth every 6 (six) hours as needed for mild pain (pain score 1-3 or temp > 100.5).     carvedilol (COREG) 25 MG tablet Take 25 mg by mouth 2 (two) times daily.     diphenhydramine-acetaminophen (TYLENOL PM) 25-500 MG TABS tablet Take  2 tablets by mouth at bedtime.     ELIQUIS 5 MG TABS tablet TAKE 1 TABLET BY MOUTH TWICE A DAY 180 tablet 3   ENTRESTO 97-103 MG TAKE 1 TABLET BY MOUTH TWICE A DAY 60 tablet 5   escitalopram (LEXAPRO) 10 MG tablet Take 1 tablet (10 mg total) by mouth daily. 90 tablet 2   FARXIGA 10 MG TABS tablet TAKE 1 TABLET BY MOUTH DAILY BEFORE BREAKFAST 30 tablet 6   fluticasone (FLONASE) 50 MCG/ACT nasal spray Place 1 spray into both nostrils daily. START with 1 spray each nostril twice a day for 1 week, then back off to daily use. 16 g 1   meloxicam (MOBIC) 15 MG tablet Take 15 mg by mouth daily as needed.     rosuvastatin (CRESTOR) 20 MG tablet TAKE 1 TABLET BY MOUTH EVERY DAY 90 tablet 3   sildenafil (VIAGRA) 100 MG tablet TAKE 0.5 TABLETS (50 MG TOTAL) BY MOUTH AT BEDTIME AS NEEDED FOR ERECTILE DYSFUNCTION. 10 tablet 3   spironolactone (ALDACTONE) 25 MG tablet TAKE 1 TABLET BY MOUTH EVERY DAY 30 tablet 11   torsemide (DEMADEX) 20 MG tablet TAKE 3 TABLETS BY MOUTH EVERY DAY 270 tablet 1   docusate sodium (COLACE) 100 MG capsule Take 1 capsule (100 mg total) by mouth 2 (two) times daily. (Patient not taking: Reported on 09/24/2021) 10 capsule 0   predniSONE (DELTASONE) 20 MG tablet Take 2 pills in the morning with breakfast for 3 days, then 1 pill for 2 days (Patient not taking: Reported on 09/24/2021) 8 tablet 0   traZODone (DESYREL) 50 MG tablet TAKE 1 TABLET BY MOUTH EVERY DAY AT BEDTIME AS NEEDED 30 tablet 1   trimethoprim-polymyxin b (POLYTRIM) ophthalmic solution Place 1 drop into the right eye every 4 (four) hours. (Patient not taking: Reported on 09/24/2021) 10 mL 0   No current facility-administered medications for this encounter.   BP 138/86    Pulse 80    Ht 5\' 11"  (1.803 m)    Wt 122.5 kg (270 lb)    SpO2 97%    BMI 37.66 kg/m  General: NAD Neck: No JVD, no thyromegaly or thyroid nodule.  Lungs: Clear to auscultation bilaterally with normal respiratory effort. CV: Nondisplaced PMI.  Heart regular  S1/S2, no S3/S4, no murmur.  No peripheral edema.  No carotid bruit.  Normal pedal pulses.  Abdomen: Soft, nontender, no hepatosplenomegaly, no distention.  Skin: Intact without lesions or rashes.  Neurologic: Alert and oriented x 3.  Psych: Normal affect. Extremities: No clubbing or cyanosis.  HEENT: Normal.   Assessment/Plan: 1. Chronic systolic CHF: Nonischemic cardiomyopathy with biventricular failure.  Cath in 2/18 with no coronary disease.  Echo (6/19) with EF 15-20%, severe RV dilation/dysfunction.  CPX 7/19 submaximal but probably moderate functional limitation.  Cardiac MRI in 7/19 showed EF 29%.  LGE pattern  was concerning for prior viral myocarditis. Last echo in 4/22 showed stable EF 25-30%.  Now with St Jude ICD.  Cause of CMP suspected to be viral myocarditis versus ETOH.  No family history of cardiomyopathy.  NYHA class II symptoms. He is not volume overloaded on exam.  He is still drinking at least a 6-pack of beer/day.   - Continue torsemide 60 mg daily.  BMET today.  - Continue dapagliflozin 10 mg daily.  - Continue spironolactone 25 mg daily.   - Continue Coreg 25 mg bid. - Continue Entresto 97/103 bid.  - He needs to stop ETOH => suspect this is either the cause or a contributor to his cardiomyopathy.    - CPX in future, cannot do yet given significant low back pain.  - Repeat echo at followup in 4 months.   2. OSA: Needs to use CPAP.  - Will refer to Dr. Radford Pax to try to get a new CPAP machine.  3. Anxiety: He is on Lexapro.  This is a major issue.  Worse since lorazepam stopped and self-medicating with ETOH.  - I will put him back on trazodone 50 mg qhs to help with sleep.  - I think he needs to see psychiatry to help him deal with his anxiety, will refer.  4. ETOH Abuse: This is likely causing/contributing to his cardiomyopathy and likely played a role in triggering atrial fibrillation.  Still drinking at least a 6-pack/day.  - Discussed cessation, he is going to make  an effort.  5. Atrial fibrillation: Paroxysmal, remains in NSR after DCCV in 12/20.  - Continue Eliquis 5 mg bid.   - Cut back on ETOH.   - If atrial fibrillation returns off ETOH, consider ablation.   Followup in 4 months with echo.    Loralie Champagne 09/24/2021

## 2021-09-24 NOTE — Patient Instructions (Signed)
Medication Changes:  None  We have sent in 1 refill for your Trazodone, FURTHER REFILLS WILL NEED TO COME FROM YOUR PRIMARY CARE PHYSICIAN  Lab Work:  Labs done today, your results will be available in MyChart, we will contact you for abnormal readings.  Testing/Procedures:  Your physician has requested that you have an echocardiogram. Echocardiography is a painless test that uses sound waves to create images of your heart. It provides your doctor with information about the size and shape of your heart and how well your hearts chambers and valves are working. This procedure takes approximately one hour. There are no restrictions for this procedure. IN 4 MONTHS  Referrals:  You have been referred to [facility] for weight loss medication, they will call you to discuss  You have been referred to Dr Bosie Clos for anxiety, his office will call you for an appointment  Special Instructions // Education:  Do the following things EVERYDAY: Weigh yourself in the morning before breakfast. Write it down and keep it in a log. Take your medicines as prescribed Eat low salt foods--Limit salt (sodium) to 2000 mg per day.  Stay as active as you can everyday Limit all fluids for the day to less than 2 liters       6)  Limit/Cut back on your Alcohol intake  Follow-Up in: 4 months with an echocardiogram  At the [facility], you and your health needs are our priority. We have a designated team specialized in the treatment of Heart Failure. This Care Team includes your primary Heart Failure Specialized Cardiologist (physician), Advanced Practice Providers (APPs- Physician Assistants and Nurse Practitioners), and Pharmacist who all work together to provide you with the care you need, when you need it.   You may see any of the following providers on your designated Care Team at your next follow up:  Dr Arvilla Meres Dr Carron Curie, NP Robbie Lis, [place] [facility] [place], [place] Karle Plumber, PharmD   Please be sure to bring in all your medications bottles to every appointment.   Need to Contact us:  If you have any questions or concerns before your next appointment please send [place] a message through [place] or call our office at [phone].    TO LEAVE A MESSAGE FOR THE NURSE SELECT OPTION 2, PLEASE LEAVE A MESSAGE INCLUDING: YOUR NAME DATE OF BIRTH CALL BACK NUMBER REASON FOR CALL**this is important as we prioritize the call backs  YOU WILL RECEIVE A CALL BACK THE SAME DAY AS LONG AS YOU CALL BEFORE 4:00 PM

## 2021-09-25 ENCOUNTER — Telehealth: Payer: Self-pay | Admitting: Student-PharmD

## 2021-09-25 ENCOUNTER — Encounter: Payer: Self-pay | Admitting: Student-PharmD

## 2021-09-25 NOTE — Addendum Note (Signed)
Encounter addended by: Noralee Space, RN on: 09/25/2021 9:02 AM  Actions taken: Visit diagnoses modified, Order list changed, Diagnosis association updated

## 2021-09-25 NOTE — Telephone Encounter (Signed)
Received referral from Dr. Shirlee Latch to start semaglutide for this patient for weight loss. They meet FDA approved criteria for semaglutide for use in obesity given BMI 37.66. Obesity is complicated by chronic conditions including CHF, OSA, prediabetes, CAD. No contraindications seen in the chart to [facility] use.   Submitted PA for [facility] 09/25/21.

## 2021-09-25 NOTE — Telephone Encounter (Signed)
PA for [facility] denied and Ozempic not covered since he does not have T2DM. Called patient to let him know of this. Tried calling patient to let him know of this but unable to reach. LVM and will also send him a MyChart message as an Financial planner.

## 2021-09-26 ENCOUNTER — Telehealth: Payer: Self-pay | Admitting: Cardiology

## 2021-09-26 DIAGNOSIS — R7303 Prediabetes: Secondary | ICD-10-CM

## 2021-09-26 NOTE — Telephone Encounter (Signed)
Patient and wife returning call from yesterday. See phone note from 09/25/21 regarding insurance denial for weightloss medication. Patient and wife have question's regarding why it was denied, please advise.

## 2021-09-26 NOTE — Telephone Encounter (Signed)
John [name] denied because his insurance does not cover medications for weight loss. Tried for [name] as off label use but insurance denied since pt does not have diabetes diagnosis (does have prediabetes but insurance will not cover for this indication).  Spoke with pt and his wife to explain the above. His A1c was very close to the DM range at 6.4% when it was checked 4 months ago. Pt still wants to start on GLP therapy. Advised we can recheck his A1c and if it's at least 6.5%, then we could resubmit for Ozempic coverage. He would like to proceed with this.  Order placed at [facility] office (pt also follows with Dr Royann Shivers). Will follow up with pt once A1c is checked.

## 2021-10-03 ENCOUNTER — Encounter (HOSPITAL_COMMUNITY): Payer: Self-pay

## 2021-10-03 ENCOUNTER — Ambulatory Visit (HOSPITAL_COMMUNITY): Admission: RE | Admit: 2021-10-03 | Payer: BLUE CROSS/BLUE SHIELD | Source: Ambulatory Visit

## 2021-10-10 NOTE — Telephone Encounter (Signed)
Left message for pt as a reminder to have his A1c rechecked, order already placed for [facility] office.

## 2021-10-12 ENCOUNTER — Ambulatory Visit (HOSPITAL_COMMUNITY)
Admission: RE | Admit: 2021-10-12 | Discharge: 2021-10-12 | Disposition: A | Discharge status: Home / Self Care | Payer: BLUE CROSS/BLUE SHIELD | Source: Ambulatory Visit | Attending: Internal Medicine | Admitting: Internal Medicine

## 2021-10-12 ENCOUNTER — Telehealth (HOSPITAL_COMMUNITY): Payer: Self-pay | Admitting: *Deleted

## 2021-10-12 ENCOUNTER — Other Ambulatory Visit (HOSPITAL_COMMUNITY): Payer: Self-pay

## 2021-10-12 ENCOUNTER — Encounter (HOSPITAL_COMMUNITY): Payer: Self-pay | Admitting: Internal Medicine

## 2021-10-12 ENCOUNTER — Other Ambulatory Visit: Payer: Self-pay

## 2021-10-12 ENCOUNTER — Telehealth: Payer: Self-pay | Admitting: Pharmacist

## 2021-10-12 DIAGNOSIS — I5042 Chronic combined systolic (congestive) and diastolic (congestive) heart failure: Secondary | ICD-10-CM | POA: Diagnosis present

## 2021-10-12 DIAGNOSIS — I10 Essential (primary) hypertension: Secondary | ICD-10-CM

## 2021-10-12 LAB — HEMOGLOBIN A1C
Hgb A1c MFr Bld: 5.5 % (ref 4.8–5.6)
Mean Plasma Glucose: 111.15 mg/dL

## 2021-10-12 NOTE — Telephone Encounter (Signed)
Pt called stating the psychiatrist he was referred to does not take BCBS he asked if there was anyone else we could refer him to.

## 2021-10-12 NOTE — Telephone Encounter (Signed)
A1c normal at 5.5%. Rechecked per pt request as he was referred to PharmD for semaglutide therapy but his insurance does not cover Wegovy at all and only covers Ozempic for patients with diabetes. His last A1c was very close to diabetic range at 6.4% but has notably improved since then back to baseline of 5.5. 6.4 seems to be an outlier. Good news that A1c is in normal range but unfortunate that this also means pt will be unable to start semaglutide for weight loss. Advised pt to let me know if he changes insurance plans in the future, can reevaluate coverage at that time.

## 2021-10-12 NOTE — Telephone Encounter (Signed)
Would see if the social workers have any ideas for him.

## 2021-10-15 ENCOUNTER — Telehealth (HOSPITAL_COMMUNITY): Payer: Self-pay | Admitting: Licensed Clinical Social Worker

## 2021-10-15 NOTE — Telephone Encounter (Signed)
CSW forwarded message that pt needing help finding psychiatrist who is in network with pt insurance.  CSW attempted to call pt to discuss- phone went straight to VM- left message requesting return call  Burna Sis, LCSW Clinical Social Worker [facility] Desk#: [phone] Cell#: [phone]

## 2021-10-15 NOTE — Telephone Encounter (Signed)
CSW received call back from pt.  Confirmed he is interested in being set up with psychiatrist to assist with medication management to help manage his depression/anxiety  CSW assisted pt in calling [employer] to get list of in network providers. Provider list sent to pt to follow up.  Will continue to follow and assist as needed  Burna Sis, LCSW Clinical Social Worker [facility] Desk#: [phone] Cell#: [phone]

## 2021-10-16 NOTE — Telephone Encounter (Signed)
Ozempic PA was denied. Called patient to let him know. He thanked me for the call and thanks Aundra Millet for her work on trying to get him coverage. He appreciated the close follow up/updates.

## 2021-10-17 ENCOUNTER — Telehealth: Payer: Self-pay | Admitting: *Deleted

## 2021-10-17 NOTE — Telephone Encounter (Signed)
From: Quintella Reichert, MD  Sent: 09/25/2021   8:45 AM EST  To: Laurey Morale, MD, Reesa Chew, CMA   I have never seen this patient before so he will need an OV to establish care and order a new device  ----- Message -----  From: Laurey Morale, MD  Sent: 09/24/2021  10:49 PM EST  To: Quintella Reichert, MD   This patient apparently needs a new CPAP machine, can your office facilitate?

## 2021-10-30 ENCOUNTER — Ambulatory Visit (INDEPENDENT_AMBULATORY_CARE_PROVIDER_SITE_OTHER): Payer: Self-pay

## 2021-10-30 DIAGNOSIS — I428 Other cardiomyopathies: Secondary | ICD-10-CM

## 2021-10-30 LAB — CUP PACEART REMOTE DEVICE CHECK
Battery Remaining Longevity: 73 mo
Battery Remaining Percentage: 70 %
Battery Voltage: 2.98 V
Brady Statistic RV Percent Paced: 1 %
Date Time Interrogation Session: 20230214020018
HighPow Impedance: 90 Ohm
HighPow Impedance: 90 Ohm
Lead Channel Impedance Value: 490 Ohm
Lead Channel Pacing Threshold Amplitude: 0.75 V
Lead Channel Pacing Threshold Pulse Width: 0.5 ms
Lead Channel Sensing Intrinsic Amplitude: 12 mV
Lead Channel Setting Pacing Amplitude: 2.5 V
Lead Channel Setting Pacing Pulse Width: 0.5 ms
Lead Channel Setting Sensing Sensitivity: 0.5 mV
Pulse Gen Serial Number: 9824800

## 2021-11-05 NOTE — Progress Notes (Signed)
Remote ICD transmission.   

## 2021-11-06 ENCOUNTER — Other Ambulatory Visit: Payer: Self-pay | Admitting: Family Medicine

## 2021-11-09 ENCOUNTER — Encounter (HOSPITAL_COMMUNITY): Payer: Self-pay | Admitting: *Deleted

## 2021-11-09 NOTE — Progress Notes (Signed)
Disability form completed, signed by Dr Aundra Dubin, and faxed into [employer]. Mychart mess sent to pt to let him know.

## 2021-11-20 ENCOUNTER — Other Ambulatory Visit (HOSPITAL_COMMUNITY): Payer: Self-pay

## 2021-11-20 ENCOUNTER — Telehealth (HOSPITAL_COMMUNITY): Payer: Self-pay | Admitting: Pharmacy Technician

## 2021-11-20 NOTE — Telephone Encounter (Signed)
[facility] Patient Advocate Encounter ? ?Prior Authorization for Eliquis has been submitted and approved.   ? ?PA# [id number] ?Effective dates: 11/20/21 through 11/20/22 ? ?Patients co-pay is $162.51 (90 days) ? ?Charlann Boxer, CPhT ? ?

## 2021-11-29 ENCOUNTER — Telehealth: Payer: Self-pay | Admitting: Pharmacist

## 2021-11-29 MED ORDER — WEGOVY 0.25 MG/0.5ML ~~LOC~~ SOAJ: 0.2500 mg | SUBCUTANEOUS | 0 refills | Status: DC

## 2021-11-29 NOTE — Telephone Encounter (Signed)
Pt called clinic and left message. Previously referred for [place] therapy but his insurance did not cover Ozempic or Wegovy - see 1/27 phone note for details. He states he has new insurance with Christella Scheuermann now and wants to see if med is covered. ? ?PA sent for [facility], received message that med is on formulary which usually means it's covered but copay is > $1000 and unaffordable. ? ?Rx sent to pharmacy to assess cost. ? ?Copay [phone] without insurance and [phone] with insurance. Pt aware of copay and will not start on medication. He also states he does not want to give himself injections. ?

## 2021-12-04 ENCOUNTER — Ambulatory Visit: Payer: BLUE CROSS/BLUE SHIELD | Admitting: Cardiology

## 2021-12-07 ENCOUNTER — Other Ambulatory Visit: Payer: Self-pay

## 2021-12-07 ENCOUNTER — Encounter: Payer: Self-pay | Admitting: Cardiology

## 2021-12-07 ENCOUNTER — Telehealth: Payer: Self-pay | Admitting: *Deleted

## 2021-12-07 ENCOUNTER — Ambulatory Visit (INDEPENDENT_AMBULATORY_CARE_PROVIDER_SITE_OTHER): Payer: Managed Care, Other (non HMO) | Admitting: Cardiology

## 2021-12-07 VITALS — BP 124/76 | HR 73 | Ht 70.0 in | Wt 271.2 lb

## 2021-12-07 DIAGNOSIS — I10 Essential (primary) hypertension: Secondary | ICD-10-CM | POA: Diagnosis not present

## 2021-12-07 DIAGNOSIS — G4733 Obstructive sleep apnea (adult) (pediatric): Secondary | ICD-10-CM | POA: Diagnosis not present

## 2021-12-07 NOTE — Progress Notes (Signed)
?Cardiology Office Note:   ? ?Date:  12/07/2021  ? ?ID:  John [name], DOB Jan 31, 1961, MRN [id number] ? ?PCP:  Marin Olp, MD  ?Cardiologist:  Loralie Champagne, MD   ? ?Referring MD: Marin Olp, MD  ? ?Chief Complaint  ?Patient presents with  ? Sleep Apnea  ? Hypertension  ? ? ?History of Present Illness:   ? ?John [name] is a 61 y.o. male with a hx of  nonischemic cardiomyopathy and OSA.  He has a history of OSA diagnosed remotely had been on CPAP therapy but needs a new machine and is now been referred by Dr. Aundra Dubin for further follow-up of his sleep apnea. He tells me that his device stopped working over the past year.  He says that his fiance says that he snores during sleep and she witnesses apneic episodes. He feels very sleepy in the am but does not have to nap during the day but sleeps until 12noon.   ? ?Past Medical History:  ?Diagnosis Date  ? AICD (automatic cardioverter/defibrillator) present   ? [employer]  ? Anxiety   ? Ativan  ? Chronic combined systolic and diastolic CHF, NYHA class 3 ([place]) 10/2016  ? Nonischemic cardiomyopathy. EF 20-25%.  ? Chronic left hip pain   ? Depression   ? history of  ? Dysrhythmia   ? A-FIB  ? GI bleed 03/2016  ? Headache   ? Hypertensive heart disease with combined systolic and diastolic congestive heart failure ([place]) 10/2016  ? Nonischemic cardiomyopathy ([place]) 10/2016  ? Echo with EF 20-25%. Cardiac catheterization with no CAD. LVEDP was 41 mmHg, PCWP 36 mmHg  ? Osteoarthritis   ? right shoulder  ? Right hand fracture   ? Seasonal allergies   ? Sleep apnea   ? wears a CPAP  ? Wears glasses   ? ? ?Past Surgical History:  ?Procedure Laterality Date  ? CARDIAC CATHETERIZATION    ? CARDIOVERSION N/A 09/01/2019  ? Procedure: CARDIOVERSION;  Surgeon: Larey Dresser, MD;  Location[facility] ENDOSCOPY;  Service: Cardiovascular;  Laterality: N/A;  ? ESOPHAGOGASTRODUODENOSCOPY (EGD) WITH PROPOFOL N/A 03/20/2016  ? Procedure: ESOPHAGOGASTRODUODENOSCOPY (EGD) WITH PROPOFOL;   Surgeon: Wilford Corner, MD;  Location: [facility] ENDOSCOPY;  Service: Endoscopy;  Laterality: N/A;  ? ICD IMPLANT N/A 05/06/2018  ? Procedure: ICD IMPLANT;  Surgeon: Sanda Klein, MD;  Location: [place] CV LAB;  Service: Cardiovascular;  Laterality: N/A;  ? IRRIGATION AND DEBRIDEMENT SHOULDER Left 11/11/2019  ? Procedure: LEFT SHOULDER IRRIGATION AND DEBRIDEMENT WITH POLY EXCHANGE;  Surgeon: Tania Ade, MD;  Location: [facility] ORS;  Service: Orthopedics;  Laterality: Left;  ? KYPHOPLASTY N/A 02/22/2021  ? Procedure: KYPHOPLASTY T12;  Surgeon: Melina Schools, MD;  Location: [place];  Service: Orthopedics;  Laterality: N/A;  90 mins  ? RIGHT/LEFT HEART CATH AND CORONARY ANGIOGRAPHY N/A 10/21/2016  ? Procedure: Right/Left Heart Cath and Coronary Angiography;  Surgeon: Lorretta Harp, MD;  Location: [place] CV LAB;  Service: Cardiovascular: Angiographically normal coronary arteries. PCWP 33-36 mmHg, LVEDP 41 mmHg. PA pressure 60/35, mean 46 mmHg.  Cardiac output/cardiac index-3.93 /1.76 Kathlen Brunswick), 3.49/1.57 (thermodilution)  ? TEE WITHOUT CARDIOVERSION N/A 09/01/2019  ? Procedure: TRANSESOPHAGEAL ECHOCARDIOGRAM (TEE);  Surgeon: Larey Dresser, MD;  Location: [facility] ENDOSCOPY;  Service: Cardiovascular;  Laterality: N/A;  ? TOTAL HIP ARTHROPLASTY Left 03/27/2015  ? Procedure: LEFT TOTAL HIP ARTHROPLASTY ANTERIOR APPROACH;  Surgeon: Rod Can, MD;  Location: [place];  Service: Orthopedics;  Laterality: Left;  ? TOTAL HIP ARTHROPLASTY  Right 05/03/2021  ? Procedure: TOTAL HIP ARTHROPLASTY ANTERIOR APPROACH;  Surgeon: Rod Can, MD;  Location: [facility] OR[facility];  Service: Orthopedics;  Laterality: Right;  ? TOTAL SHOULDER ARTHROPLASTY Right 11/24/2015  ? Procedure: RIGHT TOTAL SHOULDER ARTHROPLASTY;  Surgeon: Netta Cedars, MD;  Location: [place];  Service: Orthopedics;  Laterality: Right;  ? TOTAL SHOULDER ARTHROPLASTY Left 10/19/2019  ? Procedure: REVERSE TOTAL SHOULDER ARTHROPLASTY;  Surgeon: Tania Ade, MD;  Location: [facility];   Service: Orthopedics;  Laterality: Left;  ? TRANSTHORACIC ECHOCARDIOGRAM  10/2016  ? EF 20-25%. Diffuse hypokinesis but akinesis of the entire inferoseptal wall and apical wall. Moderate biatrial enlargement. PA pressure estimated 64 mmHg.  ? WISDOM TOOTH EXTRACTION    ? ? ?Current Medications: ?Current Meds  ?Medication Sig  ? acetaminophen (TYLENOL) 325 MG tablet Take 1-2 tablets (325-650 mg total) by mouth every 6 (six) hours as needed for mild pain (pain score 1-3 or temp > 100.5).  ? carvedilol (COREG) 25 MG tablet Take 25 mg by mouth 2 (two) times daily.  ? diphenhydramine-acetaminophen (TYLENOL PM) 25-500 MG TABS tablet Take 2 tablets by mouth at bedtime.  ? ELIQUIS 5 MG TABS tablet TAKE 1 TABLET BY MOUTH TWICE A DAY  ? ENTRESTO 97-103 MG TAKE 1 TABLET BY MOUTH TWICE A DAY  ? escitalopram (LEXAPRO) 10 MG tablet Take 1 tablet (10 mg total) by mouth daily.  ? FARXIGA 10 MG TABS tablet TAKE 1 TABLET BY MOUTH DAILY BEFORE BREAKFAST  ? fluticasone (FLONASE) 50 MCG/ACT nasal spray Place 1 spray into both nostrils daily. START with 1 spray each nostril twice a day for 1 week, then back off to daily use.  ? meloxicam (MOBIC) 15 MG tablet Take 15 mg by mouth daily as needed.  ? rosuvastatin (CRESTOR) 20 MG tablet TAKE 1 TABLET BY MOUTH EVERY DAY  ? sertraline (ZOLOFT) 25 MG tablet Take 50 mg by mouth daily.  ? sildenafil (VIAGRA) 100 MG tablet TAKE 0.5 TABLETS (50 MG TOTAL) BY MOUTH AT BEDTIME AS NEEDED FOR ERECTILE DYSFUNCTION.  ? spironolactone (ALDACTONE) 25 MG tablet TAKE 1 TABLET BY MOUTH EVERY DAY  ? torsemide (DEMADEX) 20 MG tablet TAKE 3 TABLETS BY MOUTH EVERY DAY  ? traZODone (DESYREL) 50 MG tablet TAKE 1 TABLET BY MOUTH EVERY DAY AT BEDTIME AS NEEDED  ?  ? ?Allergies:   Cymbalta [duloxetine hcl]  ? ?Social History  ? ?Socioeconomic History  ? Marital status: Divorced  ?  Spouse name: Not on file  ? Number of children: 3  ? Years of education: Not on file  ? Highest education level: Not on file  ?Occupational  History  ? Not on file  ?Tobacco Use  ? Smoking status: Never  ? Smokeless tobacco: Never  ?Vaping Use  ? Vaping Use: Never used  ?Substance and Sexual Activity  ? Alcohol use: Yes  ?  Alcohol/week: 12.0 standard drinks  ?  Types: 12 Cans of beer per week  ?  Comment: per week  ? Drug use: No  ? Sexual activity: Yes  ?  Partners: Female  ?  Comment: gf only  ?Other Topics Concern  ? Not on file  ?Social History Narrative  ? GF that lives with him. 1 dog. Close with 3 sons that work for wife- christopher 15 at [employer], Brent 21, Alleene 25 in 2021.   ?   ? 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   ? [place]   ? ?Social Determinants of Health  ? ?Financial Resource Strain: Not on file  ?Food Insecurity: Not on file  ?Transportation Needs: Not on file  ?Physical Activity: Not on file  ?Stress: Not on file  ?Social Connections: Not on file  ?  ? ?Family History: ?The patient's family history includes Congenital heart disease in his sister; Dementia in his mother; Healthy in his brother, brother, brother, and sister; Lung cancer in his father; Prostate cancer in his brother. ? ?ROS:   ?Please see the history of present illness.    ?ROS  ?All other systems reviewed and negative.  ? ?EKGs/Labs/Other Studies Reviewed:   ? ?The following studies were reviewed today: ?none ? ?EKG:  EKG is not ordered today.   ? ?Recent Labs: ?02/16/2021: B Natriuretic Peptide 437.3 ?05/03/2021: ALT 24; Magnesium 2.0 ?09/24/2021: BUN 12; Creatinine, Ser 1.13; Hemoglobin 12.8; Platelets 264; Potassium 4.1; Sodium 135  ? ?Recent Lipid Panel ?   ?Component Value Date/Time  ? CHOL 138 09/24/2021 1050  ? CHOL 111 06/09/2018 1137  ? TRIG 112 09/24/2021 1050  ? HDL 58 09/24/2021 1050  ? HDL 53 06/09/2018 1137  ? CHOLHDL 2.4 09/24/2021 1050  ? VLDL 22 09/24/2021 1050  ? LDLCALC 58 09/24/2021 1050  ? [place] 71 08/24/2020 1004  ? ? ?Physical Exam:   ? ?VS:  BP 124/76    Pulse 73   Ht 5\' 10"  (1.778 m)   Wt 271 lb 3.2 oz (123 kg)   SpO2 96%   BMI 38.91 kg/m?    ? ?Wt Readings from Last 3 Encounters:  ?12/07/21 271 lb 3.2 oz (123 kg)  ?09/24/21 270 lb (122.5 kg)  ?12/12/2

## 2021-12-07 NOTE — Telephone Encounter (Signed)
Pt was seen in the office today with Dr. Mayford Knife who ordered an Itamar sleep study. Pt aware to not open the box until he has been called with a PIN#. Pt verbalized understanding to instructions.  ?

## 2021-12-07 NOTE — Addendum Note (Signed)
Addended by: Theresia Majors on: 12/07/2021 03:12 PM ? ? Modules accepted: Orders ? ?

## 2021-12-07 NOTE — Telephone Encounter (Signed)
Set up date 12/07/21 ?

## 2021-12-07 NOTE — Patient Instructions (Signed)
Medication Instructions:  Your physician recommends that you continue on your current medications as directed. Please refer to the Current Medication list given to you today.  *If you need a refill on your cardiac medications before your next appointment, please call your pharmacy*  Testing/Procedures: Your physician has recommended that you have a sleep study. This test records several body functions during sleep, including: brain activity, eye movement, oxygen and carbon dioxide blood levels, heart rate and rhythm, breathing rate and rhythm, the flow of air through your mouth and nose, snoring, body muscle movements, and chest and belly movement.  Follow-Up: At [facility], you and your health needs are our priority.  As part of our continuing mission to provide you with exceptional heart care, we have created designated Provider Care Teams.  These Care Teams include your primary Cardiologist (physician) and Advanced Practice Providers (APPs -  Physician Assistants and Nurse Practitioners) who all work together to provide you with the care you need, when you need it.  Follow up with Dr. Turner as needed based on results of testing.    

## 2021-12-10 NOTE — Telephone Encounter (Signed)
Was speaking with the pt and we got cut off. I called back and left vm PIN # [phone], ok to proceed with sleep study this week. Please call the office back if not able to do sleep study this week.  ? ?Called and made the patient aware that he may proceed with the [facility] Sleep Study. PIN # provided to the patient. Patient made aware that he will be contacted after the test has been read with the results and any recommendations. Patient verbalized understanding and thanked me for the call.  ? ?

## 2021-12-10 NOTE — Telephone Encounter (Signed)
PER ELLE. O   ?NO PA REQUIRED ?ref #[id number] ?

## 2021-12-15 ENCOUNTER — Other Ambulatory Visit (HOSPITAL_COMMUNITY): Payer: Self-pay | Admitting: Cardiology

## 2021-12-17 ENCOUNTER — Telehealth: Payer: Self-pay

## 2021-12-17 ENCOUNTER — Other Ambulatory Visit: Payer: Self-pay

## 2021-12-17 DIAGNOSIS — I428 Other cardiomyopathies: Secondary | ICD-10-CM

## 2021-12-17 NOTE — Telephone Encounter (Signed)
LVM for patient to call device clinic regarding  ? ?[place] alert for sustained VT with successful ATP ?Event occurred 4/1 @ 13:35, EGM shows sustained VT, HR 214 falling in to the VF zone ?ATP delivered x1 converting to regular VS ? ? ? ? ? ? ? ?

## 2021-12-17 NOTE — Telephone Encounter (Signed)
Make sure he gets appointment soon please.  ?

## 2021-12-17 NOTE — Telephone Encounter (Signed)
Spoke with patient regarding episode, patient doesn't recall any symptoms, the only medication patient denied missing was his torsemide informed patient of driving restrictions x 6 months.  ?

## 2021-12-17 NOTE — Telephone Encounter (Signed)
The patient left a message for the nurse to return his call. His number is [phone]. ?

## 2021-12-17 NOTE — Telephone Encounter (Signed)
Can we please check BMET, Mg, TSH, BNP nd bring in for first available appt with me, Dr. Allyson Sabal or HF clinic? ?

## 2021-12-17 NOTE — Telephone Encounter (Signed)
Spoke with patient informed that Dr. Loletha Grayer would like to get some labs patient would like to get them done at his PCP office near his house which is [place] [facility] [address]  ?

## 2021-12-18 ENCOUNTER — Encounter (INDEPENDENT_AMBULATORY_CARE_PROVIDER_SITE_OTHER): Payer: Managed Care, Other (non HMO) | Admitting: Cardiology

## 2021-12-18 ENCOUNTER — Telehealth: Payer: Self-pay | Admitting: Family Medicine

## 2021-12-18 DIAGNOSIS — G4734 Idiopathic sleep related nonobstructive alveolar hypoventilation: Secondary | ICD-10-CM

## 2021-12-18 DIAGNOSIS — G4733 Obstructive sleep apnea (adult) (pediatric): Secondary | ICD-10-CM

## 2021-12-18 DIAGNOSIS — I428 Other cardiomyopathies: Secondary | ICD-10-CM

## 2021-12-18 NOTE — Telephone Encounter (Signed)
Patient call back.  He was out of town last week.  He is going to do the home sleep study tonight.  ?

## 2021-12-18 NOTE — Telephone Encounter (Signed)
Ok to order under your name? ?

## 2021-12-18 NOTE — Telephone Encounter (Signed)
Yes thanks 

## 2021-12-18 NOTE — Telephone Encounter (Signed)
Ok to schedule lab visit. 

## 2021-12-18 NOTE — Telephone Encounter (Signed)
Pt has lab orders from his cardiologist. He is wanting to have them done here. Would Dr Durene Cal be willing to take over this and place the orders here for our office? Please advise ?

## 2021-12-18 NOTE — Telephone Encounter (Signed)
I spoke with the pt and he left his Itamar equipment behind when he went out of town but he is back now and plans to do it this evening with the help of his wife, a former Engineer, civil (consulting).. he reports that he has the correct pin number and will let us know if he has any problems.  ?

## 2021-12-19 ENCOUNTER — Other Ambulatory Visit (INDEPENDENT_AMBULATORY_CARE_PROVIDER_SITE_OTHER): Payer: Managed Care, Other (non HMO)

## 2021-12-19 DIAGNOSIS — I428 Other cardiomyopathies: Secondary | ICD-10-CM

## 2021-12-20 ENCOUNTER — Telehealth: Payer: Self-pay

## 2021-12-20 LAB — BASIC METABOLIC PANEL
BUN: 18 mg/dL (ref 6–23)
CO2: 25 mEq/L (ref 19–32)
Calcium: 9.5 mg/dL (ref 8.4–10.5)
Chloride: 99 mEq/L (ref 96–112)
Creatinine, Ser: 1.13 mg/dL (ref 0.40–1.50)
GFR: 70.64 mL/min (ref >60.00)
Glucose, Bld: 102 mg/dL — ABNORMAL HIGH (ref 70–99)
Potassium: 4.5 mEq/L (ref 3.5–5.1)
Sodium: 137 mEq/L (ref 135–145)

## 2021-12-20 LAB — TSH: TSH: 1.39 u[IU]/mL (ref 0.35–5.50)

## 2021-12-20 LAB — MAGNESIUM: Magnesium: 2.2 mg/dL (ref 1.5–2.5)

## 2021-12-20 NOTE — Telephone Encounter (Signed)
The patient called to get lab results. I let him speak with Portia, rn. ?

## 2021-12-20 NOTE — Procedures (Signed)
? ?  SLEEP STUDY REPORT ?Patient Information ?Study Date: 12/18/21 ?Patient Name: John [name] ?Patient ID: [id number] ?Birth Date: 22-Jan-2061 ?Age: 61 ?Gender: Male ?BMI: 38.8 (W=271 lb, H=5' 10'') ?Neck Circ.: 18 '' ?Referring Physician: Armanda Magic, MD ? ?TEST DESCRIPTION: Home sleep apnea testing was completed using the WatchPat, a Type 1 device, utilizing  ?peripheral arterial tonometry (PAT), chest movement, actigraphy, pulse oximetry, pulse rate, body position and snore.  ?AHI was calculated with apnea and hypopnea using valid sleep time as the denominator. RDI includes apneas,  ?hypopneas, and RERAs. The data acquired and the scoring of sleep and all associated events were performed in  ?accordance with the recommended standards and specifications as outlined in the AASM Manual for the Scoring of  ?Sleep and Associated Events 2.2.0 (2015). ? ?FINDINGS: ?1. Severe Obstructive Sleep Apnea with AHI 62.8/hr.  ?2. Mild Central Sleep Apnea with pAHIc 11/hr with 12.2% Cheyne Stoke Respirations. ?3. Oxygen desaturations as low as 51%. ?4. Severe snoring was present. O2 sats were < 88% for 128.2 min. ?5. Total sleep time was 6 hrs and 38 min. ?6. 13.4% of total sleep time was spent in REM sleep.  ?7. Shortened sleep onset latency at 5 min.  ?8. Prolonged REM sleep onset latency at 488 min.  ?9. Total awakenings were 30.  ? ?DIAGNOSIS:  ?Severe Obstructive Sleep Apnea (G47.33) ?Nocturnal Hypoxemia ? ?RECOMMENDATIONS: ?1. Clinical correlation of these findings is necessary. The decision to treat obstructive sleep apnea (OSA) is usually  ?based on the presence of apnea symptoms or the presence of associated medical conditions such as Hypertension,  ?Congestive Heart Failure, Atrial Fibrillation or Obesity. The most common symptoms of OSA are snoring, gasping for  ?breath while sleeping, daytime sleepiness and fatigue.  ? ?2. Initiating apnea therapy is recommended given the presence of symptoms and/or associated  conditions.  ?Recommend proceeding with one of the following: ? ? a. Auto-CPAP therapy with a pressure range of 5-20cm H2O. ? ? b. An oral appliance (OA) that can be obtained from certain dentists with expertise in sleep medicine. These are  ?primarily of use in non-obese patients with mild and moderate disease. ? ? c. An ENT consultation which may be useful to look for specific causes of obstruction and possible treatment  ?options. ? ? d. If patient is intolerant to PAP therapy, consider referral to ENT for evaluation for hypoglossal nerve stimulator.  ? ?3. Close follow-up is necessary to ensure success with CPAP or oral appliance therapy for maximum benefit . ? ?4. A follow-up oximetry study on CPAP is recommended to assess the adequacy of therapy and determine the need  ?for supplemental oxygen or the potential need for Bi-level therapy. An arterial blood gas to determine the adequacy of  ?baseline ventilation and oxygenation should also be considered. ? ?5. Healthy sleep recommendations include: adequate nightly sleep (normal 7-9 hrs/night), avoidance of caffeine after  ?noon and alcohol near bedtime, and maintaining a sleep environment that is cool, dark and quiet. ? ?6. Weight loss for overweight patients is recommended. Even modest amounts of weight loss can significantly  ?improve the severity of sleep apnea. ? ?7. Snoring recommendations include: weight loss where appropriate, side sleeping, and avoidance of alcohol before  ?bed. ? ?8. Operation of motor vehicle should be avoided when sleepy. ? ?Signature: ?Electronically Signed: 12/20/21 ?Armanda Magic, MD; [facility]; Diplomat, American Board of Sleep Medicine ? ?

## 2021-12-20 NOTE — Telephone Encounter (Signed)
Patient calling to see if labs drawn yesterday for ICD thearpy (APT) for VT have resulted. Informed patient that labs have resulted and would be reviewed by provider. [place] driving restrictions and shock plan again reviewed. Patient to see HF clinic 01/22/22. Patient admits to increased alcohol intake.  ?

## 2021-12-22 NOTE — Telephone Encounter (Signed)
K and Mg and TSH normal ?

## 2021-12-24 ENCOUNTER — Ambulatory Visit: Payer: Managed Care, Other (non HMO)

## 2021-12-24 ENCOUNTER — Other Ambulatory Visit: Payer: Self-pay

## 2021-12-24 DIAGNOSIS — I428 Other cardiomyopathies: Secondary | ICD-10-CM

## 2021-12-24 DIAGNOSIS — G4733 Obstructive sleep apnea (adult) (pediatric): Secondary | ICD-10-CM

## 2021-12-25 ENCOUNTER — Other Ambulatory Visit: Payer: Managed Care, Other (non HMO)

## 2021-12-26 ENCOUNTER — Telehealth: Payer: Self-pay

## 2021-12-26 ENCOUNTER — Encounter: Payer: Self-pay | Admitting: Family

## 2021-12-26 ENCOUNTER — Ambulatory Visit (INDEPENDENT_AMBULATORY_CARE_PROVIDER_SITE_OTHER): Payer: Managed Care, Other (non HMO) | Admitting: Family

## 2021-12-26 ENCOUNTER — Other Ambulatory Visit: Payer: Managed Care, Other (non HMO)

## 2021-12-26 VITALS — BP 84/53 | HR 74 | Temp 98.3°F | Ht 70.0 in | Wt 271.4 lb

## 2021-12-26 DIAGNOSIS — J301 Allergic rhinitis due to pollen: Secondary | ICD-10-CM | POA: Diagnosis not present

## 2021-12-26 DIAGNOSIS — I959 Hypotension, unspecified: Secondary | ICD-10-CM

## 2021-12-26 MED ORDER — FLUTICASONE PROPIONATE 50 MCG/ACT NA SUSP: 1.0000 | Freq: Every day | NASAL | 2 refills | Status: DC

## 2021-12-26 NOTE — Patient Instructions (Addendum)
? ?  PLEASE NOTE:  If you had any lab tests please let us know if you have not heard back within a few days. You may see your results on MyChart before we have a chance to review them but we will give you a call once they are reviewed by us. If we ordered any referrals today, please let us know if you have not heard from their office within the next week.    

## 2021-12-26 NOTE — Assessment & Plan Note (Signed)
pt with very low BP, states his defibrillator went off a few days ago and was told to get blood work done to check his BNP, and since he was coming here anyway could have done here. Pt denies sx, but is very pale, c/o dry cough, not steady on his feet. Reports not eating or drinking all day b/c he took sleep medicine this am b/c he didn't sleep during the night d/t coughing. Advised pt I would call EMS  as he drove himself here to the office, explained he could not drive, his heart is not perfusing enough & he needed ambulance transport, he refused and walked out stating he would have his GF take him to ER.  ?*pt called by CMA to check status, he stated he did not go to ER, but went home, said he was fine,  BP was wnl, & he would be back tomorrow for blood work, CMA reiterated importance of going to ER, pt refused. ?

## 2021-12-26 NOTE — Telephone Encounter (Signed)
I called and spoke with patient to see how he was doing and to see if he made it to the ED. Pt refused to go and stated he went home and checked his BP. And stated It was 110/70's.  ?

## 2021-12-26 NOTE — Progress Notes (Signed)
? ?Subjective:  ? ? ? Patient ID: John [name], male    DOB: [birth year], 61 y.o.   MRN: [id number] ? ?Chief Complaint  ?Patient presents with  ? seasonal allergies  ?  Pt c/o Allergies, Symptoms include a itchy throat, watery eyes and coughing at night. Pt states Eye drops and nasal spray usually helps.   ? ?HPI: ?Allergic Rhinitis: John [name] is here for evaluation of possible allergic rhinitis. Patient's symptoms include clear rhinorrhea, itchy eyes, itchy palate, nasal congestion, postnasal drip, sneezing, and watery eyes. These symptoms are seasonal. Current triggers include exposure to pollens, dust, and weather changes. The patient has been suffering from these symptoms for approximately  weeks. The patient has tried prescription nasal sprays with good relief of symptoms.  ?Hypotension: pt states his defibrillator went off a few days ago and was told to get blood work done to check his BNP, and since he was coming here anyway could have done here. Pt denies sx, but is very pale, c/o dry cough, & not steady on his feet. Reports not eating or drinking all day b/c he took sleep medicine this am b/c he didn't sleep during the night d/t coughing, just woke up to come here.  ? ?Health Maintenance Due  ?Topic Date Due  ? Zoster Vaccines- Shingrix (1 of 2) Never done  ? COVID-19 Vaccine (4 - Booster for Pfizer series) 07/14/2020  ? ? ?Past Medical History:  ?Diagnosis Date  ? AICD (automatic cardioverter/defibrillator) present   ? St. Jude  ? Anxiety   ? Ativan  ? Chronic combined systolic and diastolic CHF, NYHA class 3 ([place]) 10/2016  ? Nonischemic cardiomyopathy. EF 20-25%.  ? Chronic left hip pain   ? Depression   ? history of  ? Dysrhythmia   ? A-FIB  ? GI bleed 03/2016  ? Headache   ? Hypertensive heart disease with combined systolic and diastolic congestive heart failure ([place]) 10/2016  ? Nonischemic cardiomyopathy ([place]) 10/2016  ? Echo with EF 20-25%. Cardiac catheterization with no CAD. LVEDP was 41  mmHg, PCWP 36 mmHg  ? Osteoarthritis   ? right shoulder  ? Right hand fracture   ? Seasonal allergies   ? Sleep apnea   ? wears a CPAP  ? Wears glasses   ? ? ?Past Surgical History:  ?Procedure Laterality Date  ? CARDIAC CATHETERIZATION    ? CARDIOVERSION N/A 09/01/2019  ? Procedure: CARDIOVERSION;  Surgeon: Larey Dresser, MD;  Location: [facility] ENDOSCOPY;  Service: Cardiovascular;  Laterality: N/A;  ? ESOPHAGOGASTRODUODENOSCOPY (EGD) WITH PROPOFOL N/A 03/20/2016  ? Procedure: ESOPHAGOGASTRODUODENOSCOPY (EGD) WITH PROPOFOL;  Surgeon: Wilford Corner, MD;  Location: [facility] ENDOSCOPY;  Service: Endoscopy;  Laterality: N/A;  ? ICD IMPLANT N/A 05/06/2018  ? Procedure: ICD IMPLANT;  Surgeon: Sanda Klein, MD;  Location: [place] CV LAB;  Service: Cardiovascular;  Laterality: N/A;  ? IRRIGATION AND DEBRIDEMENT SHOULDER Left 11/11/2019  ? Procedure: LEFT SHOULDER IRRIGATION AND DEBRIDEMENT WITH POLY EXCHANGE;  Surgeon: Tania Ade, MD;  Location: [facility];  Service: Orthopedics;  Laterality: Left;  ? KYPHOPLASTY N/A 02/22/2021  ? Procedure: KYPHOPLASTY T12;  Surgeon: Melina Schools, MD;  Location: [place];  Service: Orthopedics;  Laterality: N/A;  90 mins  ? RIGHT/LEFT HEART CATH AND CORONARY ANGIOGRAPHY N/A 10/21/2016  ? Procedure: Right/Left Heart Cath and Coronary Angiography;  Surgeon: Lorretta Harp, MD;  Location: [place] CV LAB;  Service: Cardiovascular: Angiographically normal coronary arteries. PCWP 33-36 mmHg, LVEDP 41 mmHg. PA pressure 60/35, mean  46 mmHg.  Cardiac output/cardiac index-3.93 /1.76 Kathlen Brunswick), 3.49/1.57 (thermodilution)  ? TEE WITHOUT CARDIOVERSION N/A 09/01/2019  ? Procedure: TRANSESOPHAGEAL ECHOCARDIOGRAM (TEE);  Surgeon: Larey Dresser, MD;  Location: [facility] ENDOSCOPY;  Service: Cardiovascular;  Laterality: N/A;  ? TOTAL HIP ARTHROPLASTY Left 03/27/2015  ? Procedure: LEFT TOTAL HIP ARTHROPLASTY ANTERIOR APPROACH;  Surgeon: Rod Can, MD;  Location: [place];  Service: Orthopedics;  Laterality: Left;   ? TOTAL HIP ARTHROPLASTY Right 05/03/2021  ? Procedure: TOTAL HIP ARTHROPLASTY ANTERIOR APPROACH;  Surgeon: Rod Can, MD;  Location: [facility];  Service: Orthopedics;  Laterality: Right;  ? TOTAL SHOULDER ARTHROPLASTY Right 11/24/2015  ? Procedure: RIGHT TOTAL SHOULDER ARTHROPLASTY;  Surgeon: Netta Cedars, MD;  Location: [place];  Service: Orthopedics;  Laterality: Right;  ? TOTAL SHOULDER ARTHROPLASTY Left 10/19/2019  ? Procedure: REVERSE TOTAL SHOULDER ARTHROPLASTY;  Surgeon: Tania Ade, MD;  Location: [facility];  Service: Orthopedics;  Laterality: Left;  ? TRANSTHORACIC ECHOCARDIOGRAM  10/2016  ? EF 20-25%. Diffuse hypokinesis but akinesis of the entire inferoseptal wall and apical wall. Moderate biatrial enlargement. PA pressure estimated 64 mmHg.  ? WISDOM TOOTH EXTRACTION    ? ? ?Outpatient Medications Prior to Visit  ?Medication Sig Dispense Refill  ? acetaminophen (TYLENOL) 325 MG tablet Take 1-2 tablets (325-650 mg total) by mouth every 6 (six) hours as needed for mild pain (pain score 1-3 or temp > 100.5).    ? carvedilol (COREG) 25 MG tablet Take 25 mg by mouth 2 (two) times daily.    ? diphenhydramine-acetaminophen (TYLENOL PM) 25-500 MG TABS tablet Take 2 tablets by mouth at bedtime.    ? ELIQUIS 5 MG TABS tablet TAKE 1 TABLET BY MOUTH TWICE A DAY 180 tablet 3  ? ENTRESTO 97-103 MG TAKE 1 TABLET BY MOUTH TWICE A DAY 60 tablet 5  ? escitalopram (LEXAPRO) 10 MG tablet Take 1 tablet (10 mg total) by mouth daily. 90 tablet 2  ? FARXIGA 10 MG TABS tablet TAKE 1 TABLET BY MOUTH DAILY BEFORE BREAKFAST 30 tablet 6  ? hydrOXYzine (ATARAX) 25 MG tablet Take 25 mg by mouth 3 (three) times daily.    ? meloxicam (MOBIC) 15 MG tablet Take 15 mg by mouth daily as needed.    ? rosuvastatin (CRESTOR) 20 MG tablet TAKE 1 TABLET BY MOUTH EVERY DAY 90 tablet 3  ? sertraline (ZOLOFT) 100 MG tablet Take 100 mg by mouth daily.    ? sertraline (ZOLOFT) 25 MG tablet Take 50 mg by mouth daily.    ? sildenafil (VIAGRA) 100 MG  tablet TAKE 0.5 TABLETS (50 MG TOTAL) BY MOUTH AT BEDTIME AS NEEDED FOR ERECTILE DYSFUNCTION. 10 tablet 3  ? spironolactone (ALDACTONE) 25 MG tablet TAKE 1 TABLET BY MOUTH EVERY DAY 30 tablet 11  ? torsemide (DEMADEX) 20 MG tablet TAKE 3 TABLETS BY MOUTH EVERY DAY 270 tablet 1  ? traZODone (DESYREL) 50 MG tablet TAKE 1 TABLET BY MOUTH EVERY DAY AT BEDTIME AS NEEDED 30 tablet 1  ? fluticasone (FLONASE) 50 MCG/ACT nasal spray Place 1 spray into both nostrils daily. START with 1 spray each nostril twice a day for 1 week, then back off to daily use. 16 g 1  ? ?No facility-administered medications prior to visit.  ? ? ?Allergies  ?Allergen Reactions  ? Cymbalta [Duloxetine Hcl] Nausea Only and Other (See Comments)  ?  Night sweats  ?  ? ?   ?Objective:  ?  ?Physical Exam ?Vitals and nursing note reviewed.  ?Constitutional:   ?  General: He is not in acute distress. ?   Appearance: Normal appearance.  ?HENT:  ?   Head: Normocephalic.  ?Cardiovascular:  ?   Rate and Rhythm: Normal rate and regular rhythm.  ?Pulmonary:  ?   Effort: Pulmonary effort is normal.  ?   Breath sounds: Normal breath sounds.  ?Musculoskeletal:     ?   General: Normal range of motion.  ?   Cervical back: Normal range of motion.  ?Skin: ?   General: Skin is warm and dry.  ?   Coloration: Skin is pale.  ?Neurological:  ?   Mental Status: He is alert and oriented to person, place, and time.  ?Psychiatric:     ?   Mood and Affect: Mood normal.  ? ? ?BP (!) 84/53 (BP Location: Left Arm, Patient Position: Sitting, Cuff Size: Large)   Pulse 74   Temp 98.3 ?F (36.8 ?C) (Temporal)   Ht 5\' 10"  (1.778 m)   Wt 271 lb 6 oz (123.1 kg)   SpO2 95%   BMI 38.94 kg/m?  ?Wt Readings from Last 3 Encounters:  ?12/26/21 271 lb 6 oz (123.1 kg)  ?12/07/21 271 lb 3.2 oz (123 kg)  ?09/24/21 270 lb (122.5 kg)  ? ? ?   ?Assessment & Plan:  ? ?Problem List Items Addressed This Visit   ? ?  ? Cardiovascular and Mediastinum  ? Hypotension - Primary  ?  pt with very low BP,  states his defibrillator went off a few days ago and was told to get blood work done to check his BNP, and since he was coming here anyway could have done here. Pt denies sx, but is very pale, c/o dry cough, n

## 2021-12-27 ENCOUNTER — Telehealth: Payer: Self-pay | Admitting: Family Medicine

## 2021-12-27 NOTE — Telephone Encounter (Signed)
Just an FYI, pt stated his wife took his BP again last night after he got some rest and it was 120/90 and about the same today. He believes it was high yesterday due to the otc meds he took. He is scheduled for labs tomorrow.  ?

## 2021-12-27 NOTE — Progress Notes (Signed)
Please have patient come in for visit with APP asap, ideally Friday.  ?

## 2021-12-28 ENCOUNTER — Other Ambulatory Visit (INDEPENDENT_AMBULATORY_CARE_PROVIDER_SITE_OTHER): Payer: Managed Care, Other (non HMO)

## 2021-12-28 DIAGNOSIS — I428 Other cardiomyopathies: Secondary | ICD-10-CM

## 2021-12-28 LAB — BRAIN NATRIURETIC PEPTIDE: Pro B Natriuretic peptide (BNP): 254 pg/mL — ABNORMAL HIGH (ref 0.0–100.0)

## 2021-12-31 ENCOUNTER — Telehealth: Payer: Self-pay | Admitting: Cardiology

## 2021-12-31 NOTE — Telephone Encounter (Signed)
Patient is calling stating he had a sleep study done a month ago and was supposed to be receiving a new CPAP machine, but he has not heard anything back in regards to it. Please advise.   ?

## 2022-01-01 ENCOUNTER — Telehealth: Payer: Self-pay | Admitting: *Deleted

## 2022-01-01 DIAGNOSIS — G4733 Obstructive sleep apnea (adult) (pediatric): Secondary | ICD-10-CM

## 2022-01-01 DIAGNOSIS — I10 Essential (primary) hypertension: Secondary | ICD-10-CM

## 2022-01-01 DIAGNOSIS — G4734 Idiopathic sleep related nonobstructive alveolar hypoventilation: Secondary | ICD-10-CM

## 2022-01-01 NOTE — Telephone Encounter (Signed)
The patient has been notified of the result and verbalized understanding.  All questions (if any) were answered. ?John [name], [place] 01/01/2022 4:57 PM   ? ?Titration to precert ?

## 2022-01-01 NOTE — Telephone Encounter (Signed)
-----   Message from Gaynelle Cage, CMA sent at 12/20/2021  3:32 PM EDT ----- ? ?----- Message ----- ?From: Quintella Reichert, MD ?Sent: 12/20/2021   8:45 AM EDT ?To: Cv Div Sleep Studies ? ?Please let patient know that they have sleep apnea.  Recommend therapeutic CPAP titration ASAP for treatment of patient's sleep disordered breathing.  If unable to perform an in lab titration then initiate ResMed auto CPAP from 4 to 15cm H2O with heated humidity and mask of choice and overnight pulse ox on CPAP.    ? ?

## 2022-01-03 NOTE — Telephone Encounter (Signed)
Patient was informed he would need a cpap titration at the time he was given his sleep study result. Titration will be pre-certed asap.  ?

## 2022-01-08 ENCOUNTER — Encounter (HOSPITAL_BASED_OUTPATIENT_CLINIC_OR_DEPARTMENT_OTHER): Payer: Self-pay | Admitting: Physical Therapy

## 2022-01-08 ENCOUNTER — Ambulatory Visit (HOSPITAL_BASED_OUTPATIENT_CLINIC_OR_DEPARTMENT_OTHER): Payer: Managed Care, Other (non HMO) | Attending: Orthopedic Surgery | Admitting: Physical Therapy

## 2022-01-08 DIAGNOSIS — R262 Difficulty in walking, not elsewhere classified: Secondary | ICD-10-CM | POA: Diagnosis not present

## 2022-01-08 DIAGNOSIS — Z4889 Encounter for other specified surgical aftercare: Secondary | ICD-10-CM | POA: Insufficient documentation

## 2022-01-08 DIAGNOSIS — M6281 Muscle weakness (generalized): Secondary | ICD-10-CM | POA: Diagnosis not present

## 2022-01-08 DIAGNOSIS — M5459 Other low back pain: Secondary | ICD-10-CM | POA: Diagnosis not present

## 2022-01-08 NOTE — Therapy (Signed)
?OUTPATIENT PHYSICAL THERAPY THORACOLUMBAR EVALUATION ? ? ?Patient Name: John [name] ?MRN: [id number] ?DOB:14-Apr-1961, 61 y.o., male ?Today's Date: 01/08/2022 ? ? PT End of Session - 01/08/22 1400   ? ? Visit Number 1   ? Number of Visits 22   ? Date for PT Re-Evaluation 04/08/22   ? Authorization Type [employer]   ? PT Start Time 1350   ? PT Stop Time 1430   ? PT Time Calculation (min) 40 min   ? ?  ?  ? ?  ? ? ?Past Medical History:  ?Diagnosis Date  ? AICD (automatic cardioverter/defibrillator) present   ? St. Jude  ? Anxiety   ? Ativan  ? Chronic combined systolic and diastolic CHF, NYHA class 3 ([place]) 10/2016  ? Nonischemic cardiomyopathy. EF 20-25%.  ? Chronic left hip pain   ? Depression   ? history of  ? Dysrhythmia   ? A-FIB  ? GI bleed 03/2016  ? Headache   ? Hypertensive heart disease with combined systolic and diastolic congestive heart failure ([place]) 10/2016  ? Nonischemic cardiomyopathy ([place]) 10/2016  ? Echo with EF 20-25%. Cardiac catheterization with no CAD. LVEDP was 41 mmHg, PCWP 36 mmHg  ? Osteoarthritis   ? right shoulder  ? Right hand fracture   ? Seasonal allergies   ? Sleep apnea   ? wears a CPAP  ? Wears glasses   ? ?Past Surgical History:  ?Procedure Laterality Date  ? CARDIAC CATHETERIZATION    ? CARDIOVERSION N/A 09/01/2019  ? Procedure: CARDIOVERSION;  Surgeon: Larey Dresser, MD;  Location: [facility] ENDOSCOPY;  Service: Cardiovascular;  Laterality: N/A;  ? ESOPHAGOGASTRODUODENOSCOPY (EGD) WITH PROPOFOL N/A 03/20/2016  ? Procedure: ESOPHAGOGASTRODUODENOSCOPY (EGD) WITH PROPOFOL;  Surgeon: Wilford Corner, MD;  Location: [facility] ENDOSCOPY;  Service: Endoscopy;  Laterality: N/A;  ? ICD IMPLANT N/A 05/06/2018  ? Procedure: ICD IMPLANT;  Surgeon: Sanda Klein, MD;  Location: [place] CV LAB;  Service: Cardiovascular;  Laterality: N/A;  ? IRRIGATION AND DEBRIDEMENT SHOULDER Left 11/11/2019  ? Procedure: LEFT SHOULDER IRRIGATION AND DEBRIDEMENT WITH POLY EXCHANGE;  Surgeon: Tania Ade, MD;   [facility];  Service: Orthopedics;  Laterality: Left;  ? KYPHOPLASTY N/A 02/22/2021  ? Procedure: KYPHOPLASTY T12;  Surgeon: Melina Schools, MD;  Location: [place];  Service: Orthopedics;  Laterality: N/A;  90 mins  ? RIGHT/LEFT HEART CATH AND CORONARY ANGIOGRAPHY N/A 10/21/2016  ? Procedure: Right/Left Heart Cath and Coronary Angiography;  Surgeon: Lorretta Harp, MD;  Location: [place] CV LAB;  Service: Cardiovascular: Angiographically normal coronary arteries. PCWP 33-36 mmHg, LVEDP 41 mmHg. PA pressure 60/35, mean 46 mmHg.  Cardiac output/cardiac index-3.93 /1.76 Kathlen Brunswick), 3.49/1.57 (thermodilution)  ? TEE WITHOUT CARDIOVERSION N/A 09/01/2019  ? Procedure: TRANSESOPHAGEAL ECHOCARDIOGRAM (TEE);  Surgeon: Larey Dresser, MD;  Location: [facility] ENDOSCOPY;  Service: Cardiovascular;  Laterality: N/A;  ? TOTAL HIP ARTHROPLASTY Left 03/27/2015  ? Procedure: LEFT TOTAL HIP ARTHROPLASTY ANTERIOR APPROACH;  Surgeon: Rod Can, MD;  Location: [place];  Service: Orthopedics;  Laterality: Left;  ? TOTAL HIP ARTHROPLASTY Right 05/03/2021  ? Procedure: TOTAL HIP ARTHROPLASTY ANTERIOR APPROACH;  Surgeon: Rod Can, MD;  Location: [facility];  Service: Orthopedics;  Laterality: Right;  ? TOTAL SHOULDER ARTHROPLASTY Right 11/24/2015  ? Procedure: RIGHT TOTAL SHOULDER ARTHROPLASTY;  Surgeon: Netta Cedars, MD;  Location: [place];  Service: Orthopedics;  Laterality: Right;  ? TOTAL SHOULDER ARTHROPLASTY Left 10/19/2019  ? Procedure: REVERSE TOTAL SHOULDER ARTHROPLASTY;  Surgeon: Tania Ade, MD;  Location: [facility];  Service: Orthopedics;  Laterality: Left;  ? TRANSTHORACIC ECHOCARDIOGRAM  10/2016  ? EF 20-25%. Diffuse hypokinesis but akinesis of the entire inferoseptal wall and apical wall. Moderate biatrial enlargement. PA pressure estimated 64 mmHg.  ? WISDOM TOOTH EXTRACTION    ? ?Patient Active Problem List  ? Diagnosis Date Noted  ? Persistent cough 08/27/2021  ? Acute conjunctivitis of right eye 08/27/2021  ? Osteoarthritis  of right hip 05/03/2021  ? Osteoarthritis of left hip 05/03/2021  ? Aortic atherosclerosis ([place]) 03/22/2021  ? Thoracic compression fracture ([place]) 02/22/2021  ? Frequent falls 02/16/2021  ? Hyponatremia 02/16/2021  ? Acute anemia 02/16/2021  ? Hypotension 02/16/2021  ? ETOH abuse 02/16/2021  ? GAD (generalized anxiety disorder) 05/31/2020  ? Paroxysmal atrial fibrillation ([place]) 04/14/2020  ? S/P ICD (internal cardiac defibrillator) procedure 04/14/2020  ? Status post reverse total shoulder replacement, left 11/11/2019  ? Hyperlipidemia 05/27/2018  ? Unspecified fracture of upper end of left humerus, initial encounter for closed fracture 09/03/2017  ? Essential hypertension 11/08/2016  ? Non-ischemic cardiomyopathy ([place])   ? Chronic combined systolic and diastolic CHF, NYHA class 3 ([place]) 10/2016  ? GI bleed 03/19/2016  ? Alcohol dependence in remission ([place]) 03/19/2016  ? S/P shoulder replacement 11/24/2015  ? Allergic rhinitis 08/21/2015  ? ED (erectile dysfunction) 08/21/2015  ? Hyperglycemia 08/21/2015  ? Insomnia 08/21/2015  ? Sleep apnea 08/21/2015  ? Degenerative joint disease of left hip 03/27/2015  ? ? ?PCP: Marin Olp, MD ? ?REFERRING PROVIDER: Melina Schools, MD ? ?REFERRING DIAG: Z62.89 (ICD-10-CM) - Encounter for other specified surgical aftercare ? ?THERAPY DIAG:  ?Other low back pain ? ?Muscle weakness (generalized) ? ?Difficulty walking ? ?ONSET DATE: 2022 ? ?SUBJECTIVE:                                                                                                                                                                                          ? ?SUBJECTIVE STATEMENT: ? ?Pt states he is here for his low back and his R hip. He had his  R hip replaced and the surgeon did not order PT for it. He feels that it is weaker than it shoulder be. He states that during [place], he gained a lot of weight which might be exacerbating the pain. He is unable to exercise now due to the pain and R hip. Pt  would like to consider aquatic therapy here at [facility]. Pt does have a neighborhood pool that he can use this summer.  ? ?He had difficulty with showering, bending, lifting all cause pain. Pt denies NT. Pt currently only able to walk for about 4 mins before  having pain.  ? ? ? ?PERTINENT HISTORY:  ?Kyphoplasty, R THA, L THA, bilat TSA ? ?PAIN:  ?Are you having pain? Yes: NPRS scale: 1/10 ?Pain location: L/S on R, R hip ant/post  ?Pain description: shooting, aching pain ?Aggravating factors: standing in the shower, walking ?Relieving factors: tylenol  ? ? ?PRECAUTIONS: None ? ?WEIGHT BEARING RESTRICTIONS No ? ?FALLS:  ?Has patient fallen in last 6 months? No ? ?LIVING ENVIRONMENT: ?Lives with: lives with their family ?Lives in: House/apartment ?Stairs:  ?Has following equipment at home: cane ? ?OCCUPATION: Desk job ? ?PLOF: Independent with basic ADLs ? ?PATIENT GOALS : Pt would like to get back to exercise, lose weight, and get rid pain.  ? ? ?OBJECTIVE:  ? ?DIAGNOSTIC FINDINGS:  ?Unable to see formal report in Epic ? ?PATIENT SURVEYS:  ?FOTO 31 ?42 at D/C ?8 pts MCII ? ?SCREENING FOR RED FLAGS: ?Bowel or bladder incontinence: No ?Spinal tumors: No ?Cauda equina syndrome: No ?Compression fracture: No ? ? ?COGNITION: ? Overall cognitive status: Within functional limits for tasks assessed   ?  ?SENSATION: ?WFL ? ?POSTURE:  ? Decreased lumbar lordosis, ant weight shift ? ?PALPATION: ?TTP and hyper tonicity of of posterior hip and L/S paraspinals; diminished muscle mass of R gluteals ? ?L/S ROM:  ? ?Active  A/PROM  ?01/08/2022  ?Flexion 50% p! ?  ?Extension 20% p!  ?Right lateral flexion 75%  ?Left lateral flexion 75%  ?Right rotation 50%  ?Left rotation 50%  ? (Blank rows = not tested) ? ?LE ROM:  moderately limited bilaterally due to pain, stiffness, and body habitus ? ?LE MMT: ? ?MMT Right ?01/08/2022 Left ?01/08/2022  ?Hip flexion 4/5 4/5  ?Hip extension    ?Hip abduction 4/5 4/5  ?Hip adduction 4/5 4/5  ? (Blank rows =  not tested) ? ?LUMBAR SPECIAL TESTS:  ?Slump test: Positive, Single leg stance test: Positive, and FABER test: Positive ? ?FUNCTIONAL TESTS:  ?5 times sit to stand: unable to perform without UE ?Stairs: recipr

## 2022-01-15 ENCOUNTER — Ambulatory Visit (HOSPITAL_BASED_OUTPATIENT_CLINIC_OR_DEPARTMENT_OTHER): Payer: Managed Care, Other (non HMO) | Admitting: Physical Therapy

## 2022-01-16 DIAGNOSIS — M47816 Spondylosis without myelopathy or radiculopathy, lumbar region: Secondary | ICD-10-CM | POA: Insufficient documentation

## 2022-01-18 ENCOUNTER — Ambulatory Visit (HOSPITAL_BASED_OUTPATIENT_CLINIC_OR_DEPARTMENT_OTHER): Payer: Managed Care, Other (non HMO) | Admitting: Physical Therapy

## 2022-01-22 ENCOUNTER — Encounter (HOSPITAL_COMMUNITY): Payer: Self-pay | Admitting: Cardiology

## 2022-01-22 ENCOUNTER — Ambulatory Visit (HOSPITAL_COMMUNITY)
Admission: RE | Admit: 2022-01-22 | Discharge: 2022-01-22 | Disposition: A | Discharge status: Home / Self Care | Payer: Commercial Managed Care - HMO | Source: Ambulatory Visit | Attending: Cardiology | Admitting: Cardiology

## 2022-01-22 ENCOUNTER — Ambulatory Visit (HOSPITAL_BASED_OUTPATIENT_CLINIC_OR_DEPARTMENT_OTHER)
Admission: RE | Admit: 2022-01-22 | Discharge: 2022-01-22 | Disposition: A | Discharge status: Home / Self Care | Payer: Commercial Managed Care - HMO | Source: Ambulatory Visit | Attending: Cardiology | Admitting: Cardiology

## 2022-01-22 VITALS — BP 108/68 | HR 66 | Wt 272.2 lb

## 2022-01-22 DIAGNOSIS — F32A Depression, unspecified: Secondary | ICD-10-CM | POA: Insufficient documentation

## 2022-01-22 DIAGNOSIS — I428 Other cardiomyopathies: Secondary | ICD-10-CM | POA: Insufficient documentation

## 2022-01-22 DIAGNOSIS — Z7901 Long term (current) use of anticoagulants: Secondary | ICD-10-CM | POA: Insufficient documentation

## 2022-01-22 DIAGNOSIS — I5022 Chronic systolic (congestive) heart failure: Secondary | ICD-10-CM | POA: Insufficient documentation

## 2022-01-22 DIAGNOSIS — Z79899 Other long term (current) drug therapy: Secondary | ICD-10-CM | POA: Insufficient documentation

## 2022-01-22 DIAGNOSIS — G4733 Obstructive sleep apnea (adult) (pediatric): Secondary | ICD-10-CM | POA: Insufficient documentation

## 2022-01-22 DIAGNOSIS — I5042 Chronic combined systolic (congestive) and diastolic (congestive) heart failure: Secondary | ICD-10-CM

## 2022-01-22 DIAGNOSIS — Z96612 Presence of left artificial shoulder joint: Secondary | ICD-10-CM | POA: Insufficient documentation

## 2022-01-22 DIAGNOSIS — Z95810 Presence of automatic (implantable) cardiac defibrillator: Secondary | ICD-10-CM | POA: Diagnosis not present

## 2022-01-22 DIAGNOSIS — F1010 Alcohol abuse, uncomplicated: Secondary | ICD-10-CM | POA: Diagnosis not present

## 2022-01-22 DIAGNOSIS — I480 Paroxysmal atrial fibrillation: Secondary | ICD-10-CM | POA: Diagnosis not present

## 2022-01-22 DIAGNOSIS — I5082 Biventricular heart failure: Secondary | ICD-10-CM | POA: Diagnosis present

## 2022-01-22 DIAGNOSIS — M545 Low back pain, unspecified: Secondary | ICD-10-CM | POA: Insufficient documentation

## 2022-01-22 DIAGNOSIS — F419 Anxiety disorder, unspecified: Secondary | ICD-10-CM | POA: Diagnosis not present

## 2022-01-22 LAB — ECHOCARDIOGRAM COMPLETE
AR max vel: 2.35 cm2
AV Peak grad: 9.1 mmHg
Ao pk vel: 1.51 m/s
Area-P 1/2: 3.6 cm2
Calc EF: 38.8 %
S' Lateral: 4.8 cm
Single Plane A2C EF: 42.2 %
Single Plane A4C EF: 39.3 %

## 2022-01-22 LAB — BASIC METABOLIC PANEL WITH GFR
Anion gap: 12 (ref 5–15)
BUN: 25 mg/dL — ABNORMAL HIGH (ref 6–20)
CO2: 27 mmol/L (ref 22–32)
Calcium: 9.6 mg/dL (ref 8.9–10.3)
Chloride: 95 mmol/L — ABNORMAL LOW (ref 98–111)
Creatinine, Ser: 1.3 mg/dL — ABNORMAL HIGH (ref 0.61–1.24)
GFR, Estimated: >60 mL/min (ref >60)
Glucose, Bld: 118 mg/dL — ABNORMAL HIGH (ref 70–99)
Potassium: 4.8 mmol/L (ref 3.5–5.1)
Sodium: 134 mmol/L — ABNORMAL LOW (ref 135–145)

## 2022-01-22 LAB — MAGNESIUM: Magnesium: 2.4 mg/dL (ref 1.7–2.4)

## 2022-01-22 LAB — BRAIN NATRIURETIC PEPTIDE: B Natriuretic Peptide: 242.1 pg/mL — ABNORMAL HIGH (ref 0.0–100.0)

## 2022-01-22 MED ORDER — PERFLUTREN LIPID MICROSPHERE
1.0000 mL | INTRAVENOUS | Status: DC | PRN
  Administered 2022-01-22: 2 mL via INTRAVENOUS
  Filled 2022-01-22: qty 10

## 2022-01-22 MED ORDER — SPIRONOLACTONE 50 MG PO TABS: 50.0000 mg | ORAL_TABLET | Freq: Every day | ORAL | 3 refills | Status: DC

## 2022-01-22 NOTE — Patient Instructions (Signed)
Medication Changes: ? ?Increase Spironolactone to 50mg  daily ? ?Lab Work: ? ?Labs done today, your results will be available in MyChart, we will contact you for abnormal readings. ? ? ?Testing/Procedures: ? ?Repeat blood work in 10 days ? ?Referrals: ? ?You have be [facility]. They will contact you to arrange your appointment ? ?Special Instructions // Education: ? ?GET A STATIONARY BIKE ? ?Follow-Up in: 3 months  ? ?At the [facility], you and your health needs are our priority. We have a designated team specialized in the treatment of Heart Failure. This Care Team includes your primary Heart Failure Specialized Cardiologist (physician), Advanced Practice Providers (APPs- Physician Assistants and Nurse Practitioners), and Pharmacist who all work together to provide you with the care you need, when you need it.  ? ?You may see any of the following providers on your designated Care Team at your next follow up: ? ?Dr ?Dr Arvilla Meres ?Marca Ancona, NP ?Tonye Becket, PA ?Jessica Milford,NP ?Robbie Lis, PA ?Anna Genre, PharmD ? ? ?Please be sure to bring in all your medications bottles to every appointment.  ? ?Need to Contact Karle Plumber: ? ?If you have any questions or concerns before your next appointment please send [place] a message through [place] or call our office at [phone].   ? ?TO LEAVE A MESSAGE FOR THE NURSE SELECT OPTION 2, PLEASE LEAVE A MESSAGE INCLUDING: ?YOUR NAME ?DATE OF BIRTH ?CALL BACK NUMBER ?REASON FOR CALL**this is important as we prioritize the call backs ? ?YOU WILL RECEIVE A CALL BACK THE SAME DAY AS LONG AS YOU CALL BEFORE 4:00 PM ? ? ?

## 2022-01-22 NOTE — Progress Notes (Signed)
PCP: Dr. Caprice Beaver ?Cardiology: Dr. Gwenlyn Found ?HF Cardiology: Dr. Aundra Dubin ? ?61 y.o. with history of nonischemic cardiomyopathy and OSA was referred by Dr. Gwenlyn Found for evaluation of CHF.  He was initially diagnosed in early 2/18.  He had URI symptoms then developed dyspnea and put on weight. Echo was done in 2/18, showing EF 20-25%.  LHC in 2/18 showed normal coronaries. He was started on treatment for cardiomyopathy, and EF rose to 40% by 6/18. However, echo in 6/19 showed EF back down to 15-20% with severe RV dilation/dysfunction.  CPX was done in 7/19, showed moderate functional limitation though submaximal.  He had St Jude ICD placed in 8/19.  ? ?Atrial fibrillation noted in 12/20, he had TEE-guided DCCV in 12/20.  He remains in NSR today and has not felt palpitations.  EF on TEE was 25-30%.  ? ?He has been a heavy drinker but has cut back.  Still drinking on occasion, however.  ? ?He had left shoulder replacement in 2/21.  He developed a surgical site infection that is now resolved.   ? ?Echo in 4/22 showed EF 25-30%, mild LV dilation, normal RV.  ? ?He had THR in 8/22.  ? ?VT in 4/23, terminated by [facility].  ? ?Echo was done today and reviewed, EF 30-35%, global HK, mild LVH, normal RV.  ? ?Patient returns for followup of CHF.  Waiting for CPAP titration.  Now seeing psychiatrist for depression/anxiety.  He is drinking less, averages about 3 beers/day.  Not exercising much due to low back pain.  Weight up 2 lbs. No dyspnea walking on flat ground.  No orthopnea/PND.  No chest pain.  No lightheadedness or palpitations.  ? ?ECG (personally reviewed): NSR, nonspecific T wave flattening.  ? ?St Jude device interrogation: stable thoracic impedance.  ? ?Labs (7/19): K 4.9, creatinine 0.91 ?Labs (8/19): Na 128, K 4.9, creatinine 0.72 ?Labs (9/19): LDL 45 ?Labs (12/19): K 3.9, creatinine 0.94  ?Labs (1/20): K 4.5, creatinine 0.94, hgb 13.2 ?Labs (10/20): K 4.4, creatinine 0.99 ?Labs (12/20): K 4.6, creatinine 1.0 ?Labs (2/21): K  3.9, creatinine 0.86 ?Labs (4/21): K 4.9, creatinine 1.05 ?Labs (6/21): K 4, creatinine 0.94 ?Labs (12/21): K 4.6, creatinine 1.0, LFTs normal, hgb 14.1, TGs 163, LDL 71 ?Labs (2/22): K 3.40m, creatinine 0.91, BNP 1130 ?Labs (8/22): K 4.8, creatinine 1.36 ?Labs (4/23): BNP 254, K 4.5, creatinine 1.13 ? ?PMH: ?1. OSA: uses CPAP.  ?2. Depression ?3. Chronic systolic CHF: Nonischemic cardiomyopathy.  Diagnosed around 2/18.  ?- LHC (2/18): Normal coronaries.  ?- Echo (2/18): EF 20-25% ?- Echo (6/18): EF 40% ?- Echo (12/18): EF 35-40% ?- Echo (6/19): LV mildly dilated, EF 15-20%, RV severely dilated with severely decreased systolic function.  ?- CPX (7/19): peak VO2 16, VE/VCO2 slope 37, RER 1.03 => submaximal but probably moderate functional limitation.  ?- Cardiac MRI (7/19): EF 29% with mild LV dilation, mildly decreased RV systolic function EF [id number], mid-wall LGE throughtout the septal wall and the inferor wall.  ?- St Jude ICD placed in 8/19.  ?- Echo (12/19): EF 25-30%, diffuse hypokinesis, normal RV size and systolic function.  ?- Echo (12/20): EF 25-30%, moderate LVH, normal RV size and systolic function.  ?- TEE (12/20): EF 25-30%, mildly decreased RV systolic function.  ?- Echo (4/22): EF 25-30%, mild LV dilation, normal RV. ?- Echo (5/23): EF 30-35%, global HK, mild LVH, normal RV. ?4. ETOH abuse.  ?5. Atrial fibrillation: First noted in 12/20.  ?- DCCV to NSR in 12/20.  ?6.  Left shoulder OA ?7. THR 8/22 ? 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 No drugs. Lives in [place].  ? ?FH: Sister with congenital heart abnormality.  No other cardiac disease.  ? ?ROS: All systems reviewed and negative except as per HPI. ? ?Current Outpatient Medications  ?Medication Sig Dispense Refill  ? acetaminophen (TYLENOL) 325 MG tablet Take 1-2 tablets (325-650 mg total) by mouth every 6 (six) hours as needed for mild pain (pain score 1-3 or temp > 100.5).    ?  carvedilol (COREG) 25 MG tablet Take 25 mg by mouth 2 (two) times daily.    ? diphenhydramine-acetaminophen (TYLENOL PM) 25-500 MG TABS tablet Take 2 tablets by mouth at bedtime.    ? ELIQUIS 5 MG TABS tablet TAKE 1 TABLET BY MOUTH TWICE A DAY 180 tablet 3  ? ENTRESTO 97-103 MG TAKE 1 TABLET BY MOUTH TWICE A DAY 60 tablet 5  ? FARXIGA 10 MG TABS tablet TAKE 1 TABLET BY MOUTH DAILY BEFORE BREAKFAST 30 tablet 6  ? fluticasone (FLONASE) 50 MCG/ACT nasal spray Place 1 spray into both nostrils daily. Use twice a day until symptoms under control, then reduce to daily use. 16 g 2  ? hydrOXYzine (ATARAX) 25 MG tablet Take 25 mg by mouth as needed.    ? rosuvastatin (CRESTOR) 20 MG tablet TAKE 1 TABLET BY MOUTH EVERY DAY 90 tablet 3  ? sertraline (ZOLOFT) 100 MG tablet Take 100 mg by mouth daily.    ? sildenafil (VIAGRA) 100 MG tablet TAKE 0.5 TABLETS (50 MG TOTAL) BY MOUTH AT BEDTIME AS NEEDED FOR ERECTILE DYSFUNCTION. 10 tablet 3  ? torsemide (DEMADEX) 20 MG tablet TAKE 3 TABLETS BY MOUTH EVERY DAY 270 tablet 1  ? traZODone (DESYREL) 50 MG tablet TAKE 1 TABLET BY MOUTH EVERY DAY AT BEDTIME AS NEEDED 30 tablet 1  ? spironolactone (ALDACTONE) 50 MG tablet Take 1 tablet (50 mg total) by mouth daily. 90 tablet 3  ? ?No current facility-administered medications for this encounter.  ? ?BP 108/68   Pulse 66   Wt 123.5 kg (272 lb 3.2 oz)   SpO2 97%   BMI 39.06 kg/m?  ?General: NAD ?Neck: No JVD, no thyromegaly or thyroid nodule.  ?Lungs: Clear to auscultation bilaterally with normal respiratory effort. ?CV: Nondisplaced PMI.  Heart regular S1/S2, no S3/S4, no murmur.  No peripheral edema.  No carotid bruit.  Normal pedal pulses.  ?Abdomen: Soft, nontender, no hepatosplenomegaly, no distention.  ?Skin: Intact without lesions or rashes.  ?Neurologic: Alert and oriented x 3.  ?Psych: Normal affect. ?Extremities: No clubbing or cyanosis.  ?HEENT: Normal.  ? ?Assessment/Plan: ?1. Chronic systolic CHF: Nonischemic cardiomyopathy  with biventricular failure.  Cath in 2/18 with no coronary disease.  Echo (6/19) with EF 15-20%, severe RV dilation/dysfunction.  CPX 7/19 submaximal but probably moderate functional limitation.  Cardiac MRI in 7/19 showed EF 29%.  LGE pattern was concerning for prior viral myocarditis. Echo today showed EF 30-35%, global HK, mild LVH, normal RV.  Has St Jude ICD.  Cause of CMP suspected to be viral myocarditis versus ETOH.  No family history of cardiomyopathy.  NYHA class II symptoms. He is not volume overloaded on exam.  He is still drinking but less, about 3 beers/day.    ?- Continue torsemide 60 mg daily.  BMET today.  ?- Continue dapagliflozin 10 mg daily.  ?- Increase spironolactone to 50 mg daily with BMET today and  again in 10 days.   ?- Continue Coreg 25 mg bid. ?- Continue Entresto 97/103 bid.  ?- He needs to stop ETOH => suspect this is either the cause or a contributor to his cardiomyopathy.    ?- CPX in future, cannot do yet given significant low back pain.   ?2. OSA: Awaiting CPAP titration.  ?3. Anxiety/depression: Seeing a psychiatrist.  ?4. ETOH Abuse: This is likely causing/contributing to his cardiomyopathy and likely played a role in triggering atrial fibrillation.  He has cut back to about 3 beers/day.  ?- Discussed cessation, he is going to make an effort.  ?5. Atrial fibrillation: Paroxysmal, remains in NSR after DCCV in 12/20.  ?- Continue Eliquis 5 mg bid.   ?- Cut back on ETOH.   ?- If atrial fibrillation returns, favor ablation.  ?6. VT: Episode of VT in 4/23 terminated by [facility].  No VT since then.   ?- Check BMET and Mg today.  ?- Continue Coreg.  Would not start amiodarone with single episode terminated by [facility].  ? ?Followup in 3 months with APP.  ? ?Loralie Champagne ?01/22/2022 ? ? ? ? ?

## 2022-01-29 ENCOUNTER — Ambulatory Visit (INDEPENDENT_AMBULATORY_CARE_PROVIDER_SITE_OTHER): Payer: Self-pay

## 2022-01-29 DIAGNOSIS — I428 Other cardiomyopathies: Secondary | ICD-10-CM

## 2022-01-29 LAB — CUP PACEART REMOTE DEVICE CHECK
Battery Remaining Longevity: 70 mo
Battery Remaining Percentage: 68 %
Battery Voltage: 2.99 V
Brady Statistic RV Percent Paced: 1 %
Date Time Interrogation Session: 20230516071608
HighPow Impedance: 87 Ohm
HighPow Impedance: 87 Ohm
Lead Channel Impedance Value: 490 Ohm
Lead Channel Pacing Threshold Amplitude: 0.75 V
Lead Channel Pacing Threshold Pulse Width: 0.5 ms
Lead Channel Sensing Intrinsic Amplitude: 12 mV
Lead Channel Setting Pacing Amplitude: 2.5 V
Lead Channel Setting Pacing Pulse Width: 0.5 ms
Lead Channel Setting Sensing Sensitivity: 0.5 mV
Pulse Gen Serial Number: 9824800

## 2022-01-30 ENCOUNTER — Encounter (HOSPITAL_BASED_OUTPATIENT_CLINIC_OR_DEPARTMENT_OTHER): Payer: Self-pay

## 2022-01-30 ENCOUNTER — Emergency Department (HOSPITAL_BASED_OUTPATIENT_CLINIC_OR_DEPARTMENT_OTHER)
Admission: EM | Admit: 2022-01-30 | Discharge: 2022-01-30 | Disposition: A | Discharge status: Home / Self Care | Payer: Commercial Managed Care - HMO | Attending: Emergency Medicine | Admitting: Emergency Medicine

## 2022-01-30 ENCOUNTER — Ambulatory Visit (HOSPITAL_BASED_OUTPATIENT_CLINIC_OR_DEPARTMENT_OTHER): Payer: Managed Care, Other (non HMO) | Admitting: Physical Therapy

## 2022-01-30 ENCOUNTER — Emergency Department (HOSPITAL_BASED_OUTPATIENT_CLINIC_OR_DEPARTMENT_OTHER): Payer: Commercial Managed Care - HMO | Admitting: Radiology

## 2022-01-30 ENCOUNTER — Emergency Department (HOSPITAL_BASED_OUTPATIENT_CLINIC_OR_DEPARTMENT_OTHER): Payer: Commercial Managed Care - HMO

## 2022-01-30 ENCOUNTER — Other Ambulatory Visit: Payer: Self-pay

## 2022-01-30 ENCOUNTER — Other Ambulatory Visit (HOSPITAL_BASED_OUTPATIENT_CLINIC_OR_DEPARTMENT_OTHER): Payer: Self-pay

## 2022-01-30 DIAGNOSIS — Z7901 Long term (current) use of anticoagulants: Secondary | ICD-10-CM | POA: Insufficient documentation

## 2022-01-30 DIAGNOSIS — S52502A Unspecified fracture of the lower end of left radius, initial encounter for closed fracture: Secondary | ICD-10-CM

## 2022-01-30 DIAGNOSIS — S52615A Nondisplaced fracture of left ulna styloid process, initial encounter for closed fracture: Secondary | ICD-10-CM | POA: Diagnosis not present

## 2022-01-30 DIAGNOSIS — W01198A Fall on same level from slipping, tripping and stumbling with subsequent striking against other object, initial encounter: Secondary | ICD-10-CM | POA: Insufficient documentation

## 2022-01-30 DIAGNOSIS — Y92481 Parking lot as the place of occurrence of the external cause: Secondary | ICD-10-CM | POA: Insufficient documentation

## 2022-01-30 DIAGNOSIS — S0990XA Unspecified injury of head, initial encounter: Secondary | ICD-10-CM | POA: Diagnosis present

## 2022-01-30 MED ORDER — ONDANSETRON HCL 4 MG/2ML IJ SOLN
4.0000 mg | Freq: Once | INTRAMUSCULAR | Status: DC
  Filled 2022-01-30: qty 2

## 2022-01-30 MED ORDER — FENTANYL CITRATE PF 50 MCG/ML IJ SOSY
50.0000 ug | PREFILLED_SYRINGE | INTRAMUSCULAR | Status: DC | PRN
  Administered 2022-01-30: 50 ug via INTRAMUSCULAR
  Filled 2022-01-30: qty 1

## 2022-01-30 MED ORDER — OXYCODONE-ACETAMINOPHEN 5-325 MG PO TABS
2.0000 | ORAL_TABLET | Freq: Once | ORAL | Status: AC
  Administered 2022-01-30: 2 via ORAL
  Filled 2022-01-30: qty 2

## 2022-01-30 MED ORDER — OXYCODONE-ACETAMINOPHEN 5-325 MG PO TABS
1.0000 | ORAL_TABLET | Freq: Four times a day (QID) | ORAL | 0 refills | Status: DC | PRN
  Filled 2022-01-30: qty 12, 3d supply, fill #0

## 2022-01-30 MED ORDER — ONDANSETRON 4 MG PO TBDP
4.0000 mg | ORAL_TABLET | Freq: Once | ORAL | Status: AC
  Administered 2022-01-30: 4 mg via ORAL
  Filled 2022-01-30: qty 1

## 2022-01-30 NOTE — ED Provider Notes (Signed)
In ?[facility] EM[facility] DEPT ?Provider Note ? ? ?CSN: [id number] ?Arrival date & time: 01/30/22  0623 ? ?  ? ?History ? ?Chief Complaint  ?Patient presents with  ? Fall  ? Wrist Pain  ? ? ?John [name] is a 61 y.o. male. ? ?Patient presents with left arm/wrist deformity and pain since yesterday evening.  Patient tripped in the parking lot and tried to brace himself by putting out his hands.  Patient was at home and tripped again causing further impact.  EMS was called to evaluate and plan for ice and Tylenol at home for which did not control his pain.  Patient has mild right shoulder strain from event but primarily left wrist injury.  Patient denies syncope, no vomiting.  Patient hit his forehead mildly abrasion.  Patient has history of heart issues and defibrillator for which was checked recently and is doing well.  Patient denies chest pain shortness of breath vomiting or dizziness.  Pain with any range of motion. ? ? ?  ? ?Home Medications ?Prior to Admission medications   ?Medication Sig Start Date End Date Taking? Authorizing Provider  ?oxyCODONE-acetaminophen (PERCOCET/ROXICET) 5-325 MG tablet Take 1 tablet by mouth every 6 (six) hours as needed for severe pain. 01/30/22  Yes Blane Ohara, MD  ?acetaminophen (TYLENOL) 325 MG tablet Take 1-2 tablets (325-650 mg total) by mouth every 6 (six) hours as needed for mild pain (pain score 1-3 or temp > 100.5). 05/04/21   Darrick Grinder, PA-C  ?carvedilol (COREG) 25 MG tablet Take 25 mg by mouth 2 (two) times daily. 09/08/21   [provider]  ?diphenhydramine-acetaminophen (TYLENOL PM) 25-500 MG TABS tablet Take 2 tablets by mouth at bedtime.    [provider]  ?ELIQUIS 5 MG TABS tablet TAKE 1 TABLET BY MOUTH TWICE A DAY 05/14/21   Laurey Morale, MD  ?ENTRESTO 97-103 MG TAKE 1 TABLET BY MOUTH TWICE A DAY 09/18/21   Laurey Morale, MD  ?FARXIGA 10 MG TABS tablet TAKE 1 TABLET BY MOUTH DAILY BEFORE BREAKFAST 07/11/21   Laurey Morale, MD  ?fluticasone (FLONASE) 50 MCG/ACT nasal spray Place 1 spray into both nostrils daily. Use twice a day until symptoms under control, then reduce to daily use. 12/26/21   Dulce Sellar, NP  ?hydrOXYzine (ATARAX) 25 MG tablet Take 25 mg by mouth as needed.    [provider]  ?rosuvastatin (CRESTOR) 20 MG tablet TAKE 1 TABLET BY MOUTH EVERY DAY 07/30/21   Laurey Morale, MD  ?sertraline (ZOLOFT) 100 MG tablet Take 100 mg by mouth daily. 12/20/21   [provider]  ?sildenafil (VIAGRA) 100 MG tablet TAKE 0.5 TABLETS (50 MG TOTAL) BY MOUTH AT BEDTIME AS NEEDED FOR ERECTILE DYSFUNCTION. 07/30/21   Laurey Morale, MD  ?spironolactone (ALDACTONE) 50 MG tablet Take 1 tablet (50 mg total) by mouth daily. 01/22/22   Laurey Morale, MD  ?torsemide (DEMADEX) 20 MG tablet TAKE 3 TABLETS BY MOUTH EVERY DAY 09/25/21   Laurey Morale, MD  ?traZODone (DESYREL) 50 MG tablet TAKE 1 TABLET BY MOUTH EVERY DAY AT BEDTIME AS NEEDED 09/24/21   Laurey Morale, MD  ?   ? ?Allergies    ?Cymbalta [duloxetine hcl] and Hydrocodone   ? ?Review of Systems   ?Review of Systems  ?Constitutional:  Negative for chills and fever.  ?HENT:  Negative for congestion.   ?Eyes:  Negative for visual disturbance.  ?Respiratory:  Negative for shortness of breath.   ?  Cardiovascular:  Negative for chest pain.  ?Gastrointestinal:  Negative for abdominal pain and vomiting.  ?Genitourinary:  Negative for dysuria and flank pain.  ?Musculoskeletal:  Positive for joint swelling. Negative for back pain, neck pain and neck stiffness.  ?Skin:  Positive for rash.  ?Neurological:  Negative for light-headedness and headaches.  ? ?Physical Exam ?Updated Vital Signs ?BP 92/65 (BP Location: Right Arm)   Pulse 75   Temp 99.2 ?F (37.3 ?C)   Resp 18   SpO2 99%  ?Physical Exam ?Vitals and nursing note reviewed.  ?Constitutional:   ?   General: He is not in acute distress. ?   Appearance: He is well-developed.  ?HENT:  ?   Head:  Normocephalic. Abrasion present. No raccoon eyes or Battle's sign.  ?   Comments: Superficial abrasions lower forehead and midface.  No gaping laceration or bleeding. ?   Mouth/Throat:  ?   Mouth: Mucous membranes are moist.  ?Eyes:  ?   General:     ?   Right eye: No discharge.     ?   Left eye: No discharge.  ?   Conjunctiva/sclera: Conjunctivae normal.  ?Neck:  ?   Trachea: No tracheal deviation.  ?Cardiovascular:  ?   Rate and Rhythm: Normal rate.  ?Pulmonary:  ?   Effort: Pulmonary effort is normal.  ?Abdominal:  ?   General: There is no distension.  ?   Palpations: Abdomen is soft.  ?   Tenderness: There is no abdominal tenderness. There is no guarding.  ?Musculoskeletal:     ?   General: Swelling, tenderness, deformity and signs of injury present.  ?   Cervical back: Normal range of motion.  ?   Comments: Patient has swelling, tenderness and mild deformity dorsally left distal forearm.  Patient can flex extend distal fingers with discomfort.  No proximal forearm or left elbow tenderness.  2+ pulses distally compartments soft.  ?Skin: ?   General: Skin is warm.  ?   Capillary Refill: Capillary refill takes less than 2 seconds.  ?   Findings: No rash.  ?Neurological:  ?   General: No focal deficit present.  ?   Mental Status: He is alert.  ?   Cranial Nerves: No cranial nerve deficit.  ?Psychiatric:     ?   Mood and Affect: Mood normal.  ? ? ?ED Results / Procedures / Treatments   ?Labs ?(all labs ordered are listed, but only abnormal results are displayed) ?Labs Reviewed - No data to display ? ?EKG ?None ? ?Radiology ?DG Wrist Complete Left ? ?Result Date: 01/30/2022 ?CLINICAL DATA:  61 year old male status post falls yesterday. Left wrist pain. EXAM: LEFT WRIST - COMPLETE 3+ VIEW COMPARISON:  02/22/2016 left wrist series. FINDINGS: Highly impacted and volar displaced distal left radius fracture. DRU and radiocarpal joint involvement. Displaced ulnar styloid fracture. Carpal bone alignment appears maintained.  Metacarpals appear intact. IMPRESSION: 1. Highly impacted and volar displaced distal left radius fracture with DRU and radiocarpal joint involvement. 2. Displaced ulnar styloid fracture. Electronically Signed   By: Odessa Fleming M.D.   On: 01/30/2022 09:38  ? ?CT Head Wo Contrast ? ?Result Date: 01/30/2022 ?CLINICAL DATA:  Head trauma, coagulopathy (Age 91-64y) head injury, blood thinner EXAM: CT HEAD WITHOUT CONTRAST TECHNIQUE: Contiguous axial images were obtained from the base of the skull through the vertex without intravenous contrast. RADIATION DOSE REDUCTION: This exam was performed according to the departmental dose-optimization program which includes automated exposure control, adjustment of  the mA and/or kV according to patient size and/or use of iterative reconstruction technique. COMPARISON:  02/16/2021 FINDINGS: Brain: No evidence of acute infarction, hemorrhage, hydrocephalus, extra-axial collection or mass lesion/mass effect. Vascular: No hyperdense vessel or unexpected calcification. Skull: Normal. Negative for fracture or focal lesion. Sinuses/Orbits: No acute finding. Other: None. IMPRESSION: No acute intracranial process. Electronically Signed   By: Duanne Guess D.O.   On: 01/30/2022 10:59  ? ?CUP PACEART REMOTE DEVICE CHECK ? ?Result Date: 01/29/2022 ?Scheduled remote reviewed. Normal device function.  Next remote 91 days- JJB  ? ?Procedures ?Procedures  ? ? ?Medications Ordered in ED ?Medications  ?fentaNYL (SUBLIMAZE) injection 50 mcg (50 mcg Intramuscular Given 01/30/22 0924)  ?ondansetron (ZOFRAN-ODT) disintegrating tablet 4 mg (4 mg Oral Given 01/30/22 0924)  ?oxyCODONE-acetaminophen (PERCOCET/ROXICET) 5-325 MG per tablet 2 tablet (2 tablets Oral Given 01/30/22 1015)  ? ? ?ED Course/ Medical Decision Making/ A&P ?  ?                        ?Medical Decision Making ?Amount and/or Complexity of Data Reviewed ?Radiology: ordered. ? ?Risk ?Prescription drug management. ? ? ?Patient presents with  isolated primary injury to left wrist, concern for fracture given deformity and significant pain.   Patient denies medical symptoms for cause of fall.  X-ray ordered and reviewed independently showing dorsal displacement comm

## 2022-01-30 NOTE — ED Notes (Signed)
Patient transported to CT 

## 2022-01-30 NOTE — ED Triage Notes (Signed)
Pt reports he fell last night in a parking lot, went to brace himself by putting his hands out. Later returned home and had another mechanical fall in his office. EMS was called, they evaluated him. He applied ice and took Tylenol at home.  ?Pt with obvious swelling to his Left wrist, abrasion throughout his face.  ? ?Pt denies LOC, hit his forehead, denies N/V or dizziness  ?

## 2022-01-30 NOTE — ED Notes (Signed)
X-ray at bedside

## 2022-01-30 NOTE — Discharge Instructions (Addendum)
Call orthopedic office for soonest available appointment. ?Your CT head was okay, no bleeding. ?For severe pain take oxycodon however realize they have the potential for addiction and it can make you sleepy and has tylenol in it.  No operating machinery while taking. ? ?

## 2022-02-01 ENCOUNTER — Other Ambulatory Visit (HOSPITAL_COMMUNITY): Payer: Commercial Managed Care - HMO

## 2022-02-01 ENCOUNTER — Telehealth: Payer: Self-pay | Admitting: *Deleted

## 2022-02-01 DIAGNOSIS — S52502A Unspecified fracture of the lower end of left radius, initial encounter for closed fracture: Secondary | ICD-10-CM | POA: Insufficient documentation

## 2022-02-01 NOTE — Addendum Note (Signed)
Addended by: Reesa Chew on: 02/01/2022 04:41 PM   Modules accepted: Orders

## 2022-02-01 NOTE — Telephone Encounter (Signed)
   Pre-operative Risk Assessment    Patient Name: John [name]  DOB: 07/29/1961 MRN: [id number]      Request for Surgical Clearance    Procedure:   RIGHT WRIST ORIF  Date of Surgery:  Clearance 02/04/22                                 Surgeon:  DR. FRED [facility] Surgeon's Group or Practice Name:  John [name] Phone number:  [phone] Fax number:  [phone] ATTN: John [name]   Type of Clearance Requested:   - Medical  - Pharmacy:  Hold Apixaban (Eliquis)     Type of Anesthesia:  Not Indicated (GENERAL?)   Additional requests/questions:    John [name]   02/01/2022, 2:42 PM

## 2022-02-01 NOTE — Telephone Encounter (Signed)
I tried to reach pt on his phone, but vm is full, could not leave a message. I called his spouse and s/w her and confirmed pt's DOB. I went over the instructions with her in regard to Eliquis for the pt. Surgery is 02/04/22, plan is pt will take Eliquis tonight, then will HOLD Saturday 5/20, Sunday 5/21 and day of surgery Monday 02/04/22. Will resume once felt surgeon feels it is safe from a bleeding standpoint. Pt's spouse gave verbal understanding to plan of care. I will fax these clearance notes and recommendations to requesting office.

## 2022-02-01 NOTE — Telephone Encounter (Signed)
Patient with diagnosis of atrial fibrillation on Eliquis for anticoagulation.    Procedure: right wrist ORIF Date of procedure: 02/04/22   CHA2DS2-VASc Score = 2   This indicates a 2.2% annual risk of stroke. The patient's score is based upon: CHF History: 1 HTN History: 1 Diabetes History: 0 Stroke History: 0 Vascular Disease History: 0 Age Score: 0 Gender Score: 0    CrCl 80 Platelet count 264  Per office protocol, patient can hold Eliquis for 2 days prior to procedure.   Patient will not need bridging with Lovenox (enoxaparin) around procedure.  For orthopedic procedures please be sure to resume therapeutic (not prophylactic) dosing.

## 2022-02-01 NOTE — Telephone Encounter (Signed)
   Patient Name: John [name]  DOB: 1961/04/26 MRN: [id number]  Primary Cardiologist: Marca Ancona, MD  Chart reviewed as part of pre-operative protocol coverage. Given past  medical history with last visit and VT in 12/2021, I reviewed with Dr. Shirlee Latch who feels patient may proceed with surgery as requested at moderate CV risk without additional testing pre-operatively. Per pharmacist review, patient can hold Eliquis for 2 days prior to procedure. Patient will not need bridging with Lovenox (enoxaparin) around procedure. For orthopedic procedures please be sure to resume therapeutic (not prophylactic) dosing.   Given time-sensitive nature of clearance, will route back to our callback staff to let pt know he has been cleared and relay the Eliquis instruction to pt (last dose tonight if patient can confirm surgery is definitely 5/22). Please ask him to notify his cardiologist if any new symptoms arise between now and timing of surgery.  Will route this bundled recommendation to requesting provider via Epic fax function. Please call with questions.  Laurann Montana, PA-C 02/01/2022, 3:19 PM

## 2022-02-04 ENCOUNTER — Ambulatory Visit (HOSPITAL_COMMUNITY): Payer: Commercial Managed Care - HMO | Admitting: Anesthesiology

## 2022-02-04 ENCOUNTER — Ambulatory Visit (HOSPITAL_BASED_OUTPATIENT_CLINIC_OR_DEPARTMENT_OTHER): Payer: Commercial Managed Care - HMO | Admitting: Anesthesiology

## 2022-02-04 ENCOUNTER — Other Ambulatory Visit: Payer: Self-pay

## 2022-02-04 ENCOUNTER — Ambulatory Visit (HOSPITAL_COMMUNITY)
Admission: RE | Admit: 2022-02-04 | Discharge: 2022-02-04 | Disposition: A | Discharge status: Home / Self Care | Payer: Commercial Managed Care - HMO | Attending: Orthopedic Surgery | Admitting: Orthopedic Surgery

## 2022-02-04 ENCOUNTER — Encounter (HOSPITAL_COMMUNITY)
Admission: RE | Disposition: A | Discharge status: Home / Self Care | Payer: Self-pay | Source: Home / Self Care | Attending: Orthopedic Surgery

## 2022-02-04 ENCOUNTER — Encounter (HOSPITAL_COMMUNITY): Payer: Self-pay | Admitting: Orthopedic Surgery

## 2022-02-04 ENCOUNTER — Ambulatory Visit (HOSPITAL_COMMUNITY): Payer: Commercial Managed Care - HMO

## 2022-02-04 DIAGNOSIS — I5042 Chronic combined systolic (congestive) and diastolic (congestive) heart failure: Secondary | ICD-10-CM | POA: Insufficient documentation

## 2022-02-04 DIAGNOSIS — F419 Anxiety disorder, unspecified: Secondary | ICD-10-CM | POA: Insufficient documentation

## 2022-02-04 DIAGNOSIS — S52502A Unspecified fracture of the lower end of left radius, initial encounter for closed fracture: Secondary | ICD-10-CM

## 2022-02-04 DIAGNOSIS — F418 Other specified anxiety disorders: Secondary | ICD-10-CM

## 2022-02-04 DIAGNOSIS — G4730 Sleep apnea, unspecified: Secondary | ICD-10-CM | POA: Diagnosis not present

## 2022-02-04 DIAGNOSIS — S52572A Other intraarticular fracture of lower end of left radius, initial encounter for closed fracture: Secondary | ICD-10-CM

## 2022-02-04 DIAGNOSIS — F32A Depression, unspecified: Secondary | ICD-10-CM | POA: Diagnosis not present

## 2022-02-04 DIAGNOSIS — I509 Heart failure, unspecified: Secondary | ICD-10-CM | POA: Diagnosis not present

## 2022-02-04 DIAGNOSIS — W19XXXA Unspecified fall, initial encounter: Secondary | ICD-10-CM | POA: Insufficient documentation

## 2022-02-04 DIAGNOSIS — Z79899 Other long term (current) drug therapy: Secondary | ICD-10-CM | POA: Insufficient documentation

## 2022-02-04 DIAGNOSIS — I4891 Unspecified atrial fibrillation: Secondary | ICD-10-CM | POA: Insufficient documentation

## 2022-02-04 DIAGNOSIS — I110 Hypertensive heart disease with heart failure: Secondary | ICD-10-CM | POA: Diagnosis not present

## 2022-02-04 DIAGNOSIS — M1990 Unspecified osteoarthritis, unspecified site: Secondary | ICD-10-CM | POA: Diagnosis not present

## 2022-02-04 HISTORY — PX: ORIF WRIST FRACTURE: SHX2133

## 2022-02-04 LAB — CBC
HCT: 31.6 % — ABNORMAL LOW (ref 39.0–52.0)
Hemoglobin: 10.4 g/dL — ABNORMAL LOW (ref 13.0–17.0)
MCH: 33.4 pg (ref 26.0–34.0)
MCHC: 32.9 g/dL (ref 30.0–36.0)
MCV: 101.6 fL — ABNORMAL HIGH (ref 80.0–100.0)
Platelets: 286 10*3/uL (ref 150–400)
RBC: 3.11 MIL/uL — ABNORMAL LOW (ref 4.22–5.81)
RDW: 12.9 % (ref 11.5–15.5)
WBC: 7.8 10*3/uL (ref 4.0–10.5)
nRBC: 0 % (ref 0.0–0.2)

## 2022-02-04 LAB — BASIC METABOLIC PANEL
Anion gap: 9 (ref 5–15)
BUN: 28 mg/dL — ABNORMAL HIGH (ref 6–20)
CO2: 24 mmol/L (ref 22–32)
Calcium: 9.2 mg/dL (ref 8.9–10.3)
Chloride: 103 mmol/L (ref 98–111)
Creatinine, Ser: 1.38 mg/dL — ABNORMAL HIGH (ref 0.61–1.24)
GFR, Estimated: 59 mL/min — ABNORMAL LOW (ref >60)
Glucose, Bld: 126 mg/dL — ABNORMAL HIGH (ref 70–99)
Potassium: 4 mmol/L (ref 3.5–5.1)
Sodium: 136 mmol/L (ref 135–145)

## 2022-02-04 SURGERY — OPEN REDUCTION INTERNAL FIXATION (ORIF) WRIST FRACTURE
Anesthesia: Regional | Site: Wrist | Laterality: Left

## 2022-02-04 MED ORDER — 0.9 % SODIUM CHLORIDE (POUR BTL) OPTIME
TOPICAL | Status: DC | PRN
  Administered 2022-02-04: 1000 mL

## 2022-02-04 MED ORDER — PHENYLEPHRINE HCL-NACL 20-0.9 MG/250ML-% IV SOLN
INTRAVENOUS | Status: DC | PRN
Start: 2022-02-04 — End: 2022-02-04
  Administered 2022-02-04: 30 ug/min via INTRAVENOUS

## 2022-02-04 MED ORDER — DOCUSATE SODIUM 100 MG PO CAPS: 100.0000 mg | ORAL_CAPSULE | Freq: Two times a day (BID) | ORAL | 0 refills | Status: DC

## 2022-02-04 MED ORDER — LACTATED RINGERS IV SOLN: INTRAVENOUS | Status: DC

## 2022-02-04 MED ORDER — KETAMINE HCL 10 MG/ML IJ SOLN
INTRAMUSCULAR | Status: DC | PRN
  Administered 2022-02-04: 20 mg via INTRAVENOUS

## 2022-02-04 MED ORDER — AMISULPRIDE (ANTIEMETIC) 5 MG/2ML IV SOLN: 10.0000 mg | Freq: Once | INTRAVENOUS | Status: DC | PRN

## 2022-02-04 MED ORDER — EPHEDRINE SULFATE-NACL 50-0.9 MG/10ML-% IV SOSY
PREFILLED_SYRINGE | INTRAVENOUS | Status: DC | PRN
  Administered 2022-02-04: 2.5 mg via INTRAVENOUS

## 2022-02-04 MED ORDER — LIDOCAINE 2% (20 MG/ML) 5 ML SYRINGE
INTRAMUSCULAR | Status: AC
  Filled 2022-02-04: qty 5

## 2022-02-04 MED ORDER — ONDANSETRON HCL 4 MG/2ML IJ SOLN
INTRAMUSCULAR | Status: DC | PRN
  Administered 2022-02-04: 4 mg via INTRAVENOUS

## 2022-02-04 MED ORDER — MIDAZOLAM HCL 2 MG/2ML IJ SOLN
INTRAMUSCULAR | Status: AC
  Administered 2022-02-04: 2 mg via INTRAVENOUS
  Filled 2022-02-04: qty 2

## 2022-02-04 MED ORDER — HYDROMORPHONE HCL 1 MG/ML IJ SOLN: 0.2500 mg | INTRAMUSCULAR | Status: DC | PRN

## 2022-02-04 MED ORDER — OXYCODONE HCL 5 MG PO TABS: 5.0000 mg | ORAL_TABLET | Freq: Once | ORAL | Status: DC | PRN

## 2022-02-04 MED ORDER — CHLORHEXIDINE GLUCONATE 0.12 % MT SOLN
OROMUCOSAL | Status: AC
Start: 2022-02-04 — End: 2022-02-04
  Administered 2022-02-04: 15 mL via OROMUCOSAL
  Filled 2022-02-04: qty 15

## 2022-02-04 MED ORDER — FENTANYL CITRATE (PF) 100 MCG/2ML IJ SOLN
INTRAMUSCULAR | Status: AC
  Administered 2022-02-04: 100 ug via INTRAVENOUS
  Filled 2022-02-04: qty 2

## 2022-02-04 MED ORDER — OXYCODONE-ACETAMINOPHEN 10-325 MG PO TABS: 1.0000 | ORAL_TABLET | Freq: Four times a day (QID) | ORAL | 0 refills | Status: AC | PRN

## 2022-02-04 MED ORDER — LACTATED RINGERS IV BOLUS
500.0000 mL | Freq: Once | INTRAVENOUS | Status: AC
  Administered 2022-02-04: 500 mL via INTRAVENOUS

## 2022-02-04 MED ORDER — OXYCODONE HCL 5 MG/5ML PO SOLN: 5.0000 mg | Freq: Once | ORAL | Status: DC | PRN

## 2022-02-04 MED ORDER — EPHEDRINE 5 MG/ML INJ
INTRAVENOUS | Status: AC
  Filled 2022-02-04: qty 5

## 2022-02-04 MED ORDER — PROPOFOL 10 MG/ML IV BOLUS
INTRAVENOUS | Status: DC | PRN
  Administered 2022-02-04: 30 mg via INTRAVENOUS
  Administered 2022-02-04: 40 mg via INTRAVENOUS

## 2022-02-04 MED ORDER — CHLORHEXIDINE GLUCONATE 0.12 % MT SOLN: 15.0000 mL | Freq: Once | OROMUCOSAL | Status: AC

## 2022-02-04 MED ORDER — DEXAMETHASONE SODIUM PHOSPHATE 10 MG/ML IJ SOLN
INTRAMUSCULAR | Status: DC | PRN
  Administered 2022-02-04: 10 mg

## 2022-02-04 MED ORDER — PHENYLEPHRINE 80 MCG/ML (10ML) SYRINGE FOR IV PUSH (FOR BLOOD PRESSURE SUPPORT)
PREFILLED_SYRINGE | INTRAVENOUS | Status: AC
  Filled 2022-02-04: qty 10

## 2022-02-04 MED ORDER — CEFAZOLIN IN SODIUM CHLORIDE 3-0.9 GM/100ML-% IV SOLN
3.0000 g | INTRAVENOUS | Status: AC
  Administered 2022-02-04: 3 g via INTRAVENOUS
  Filled 2022-02-04: qty 100

## 2022-02-04 MED ORDER — FENTANYL CITRATE (PF) 100 MCG/2ML IJ SOLN: 100.0000 ug | Freq: Once | INTRAMUSCULAR | Status: AC

## 2022-02-04 MED ORDER — ONDANSETRON HCL 4 MG/2ML IJ SOLN
INTRAMUSCULAR | Status: AC
  Filled 2022-02-04: qty 2

## 2022-02-04 MED ORDER — MIDAZOLAM HCL 2 MG/2ML IJ SOLN
2.0000 mg | Freq: Once | INTRAMUSCULAR | Status: AC
Start: 2022-02-04 — End: 2022-02-04

## 2022-02-04 MED ORDER — ONDANSETRON HCL 4 MG/2ML IJ SOLN: 4.0000 mg | Freq: Once | INTRAMUSCULAR | Status: DC | PRN

## 2022-02-04 MED ORDER — PROPOFOL 500 MG/50ML IV EMUL
INTRAVENOUS | Status: DC | PRN
  Administered 2022-02-04: 50 ug/kg/min via INTRAVENOUS

## 2022-02-04 MED ORDER — ROPIVACAINE HCL 5 MG/ML IJ SOLN
INTRAMUSCULAR | Status: DC | PRN
  Administered 2022-02-04: 25 mL via PERINEURAL

## 2022-02-04 MED ORDER — PHENYLEPHRINE 80 MCG/ML (10ML) SYRINGE FOR IV PUSH (FOR BLOOD PRESSURE SUPPORT)
PREFILLED_SYRINGE | INTRAVENOUS | Status: DC | PRN
  Administered 2022-02-04 (×2): 80 ug via INTRAVENOUS

## 2022-02-04 MED ORDER — KETAMINE HCL 50 MG/5ML IJ SOSY
PREFILLED_SYRINGE | INTRAMUSCULAR | Status: AC
  Filled 2022-02-04: qty 5

## 2022-02-04 MED ORDER — ORAL CARE MOUTH RINSE: 15.0000 mL | Freq: Once | OROMUCOSAL | Status: AC

## 2022-02-04 MED ORDER — LIDOCAINE 2% (20 MG/ML) 5 ML SYRINGE
INTRAMUSCULAR | Status: DC | PRN
  Administered 2022-02-04: 50 mg via INTRAVENOUS

## 2022-02-04 SURGICAL SUPPLY — 65 items
BAG COUNTER SPONGE SURGICOUNT (BAG) ×2 IMPLANT
BIT DRILL 2.2 SS TIBIAL (BIT) ×1 IMPLANT
BLADE CLIPPER SURG (BLADE) IMPLANT
BNDG ELASTIC 3X5.8 VLCR STR LF (GAUZE/BANDAGES/DRESSINGS) ×2 IMPLANT
BNDG ELASTIC 4X5.8 VLCR STR LF (GAUZE/BANDAGES/DRESSINGS) ×1 IMPLANT
BNDG ESMARK 4X9 LF (GAUZE/BANDAGES/DRESSINGS) ×2 IMPLANT
BNDG GAUZE ELAST 4 BULKY (GAUZE/BANDAGES/DRESSINGS) ×2 IMPLANT
CORD BIPOLAR FORCEPS 12FT (ELECTRODE) ×2 IMPLANT
COVER SURGICAL LIGHT HANDLE (MISCELLANEOUS) ×2 IMPLANT
CUFF TOURN SGL QUICK 18X4 (TOURNIQUET CUFF) ×2 IMPLANT
CUFF TOURN SGL QUICK 24 (TOURNIQUET CUFF)
CUFF TRNQT CYL 24X4X16.5-23 (TOURNIQUET CUFF) IMPLANT
DRAIN TLS ROUND 10FR (DRAIN) IMPLANT
DRAPE OEC MINIVIEW 54X84 (DRAPES) ×2 IMPLANT
DRAPE SURG 17X11 SM STRL (DRAPES) ×2 IMPLANT
DRIVER BIT SQUARE 1.7/2.2 (TRAUMA) ×2 IMPLANT
DRSG ADAPTIC 3X8 NADH LF (GAUZE/BANDAGES/DRESSINGS) ×2 IMPLANT
ELECT REM PT RETURN 9FT ADLT (ELECTROSURGICAL)
ELECTRODE REM PT RTRN 9FT ADLT (ELECTROSURGICAL) IMPLANT
GAUZE SPONGE 4X4 12PLY STRL (GAUZE/BANDAGES/DRESSINGS) ×2 IMPLANT
GLOVE BIOGEL PI IND STRL 8.5 (GLOVE) ×1 IMPLANT
GLOVE BIOGEL PI INDICATOR 8.5 (GLOVE) ×1
GLOVE SURG ORTHO 8.0 STRL STRW (GLOVE) ×2 IMPLANT
GOWN STRL REUS W/ TWL LRG LVL3 (GOWN DISPOSABLE) ×3 IMPLANT
GOWN STRL REUS W/ TWL XL LVL3 (GOWN DISPOSABLE) ×1 IMPLANT
GOWN STRL REUS W/TWL LRG LVL3 (GOWN DISPOSABLE) ×3
GOWN STRL REUS W/TWL XL LVL3 (GOWN DISPOSABLE) ×1
K-WIRE 1.6 (WIRE) ×1
K-WIRE FX5X1.6XNS BN SS (WIRE) ×1
KIT BASIN OR (CUSTOM PROCEDURE TRAY) ×2 IMPLANT
KIT TURNOVER KIT B (KITS) ×2 IMPLANT
KWIRE FX5X1.6XNS BN SS (WIRE) IMPLANT
MANIFOLD NEPTUNE II (INSTRUMENTS) ×1 IMPLANT
NDL HYPO 25X1 1.5 SAFETY (NEEDLE) ×1 IMPLANT
NEEDLE HYPO 25X1 1.5 SAFETY (NEEDLE) ×2 IMPLANT
NS IRRIG 1000ML POUR BTL (IV SOLUTION) ×2 IMPLANT
PACK ORTHO EXTREMITY (CUSTOM PROCEDURE TRAY) ×2 IMPLANT
PAD ARMBOARD 7.5X6 YLW CONV (MISCELLANEOUS) ×4 IMPLANT
PAD CAST 3X4 CTTN HI CHSV (CAST SUPPLIES) IMPLANT
PAD CAST 4YDX4 CTTN HI CHSV (CAST SUPPLIES) ×1 IMPLANT
PADDING CAST COTTON 3X4 STRL (CAST SUPPLIES) ×1
PADDING CAST COTTON 4X4 STRL (CAST SUPPLIES)
PEG LOCKING SMOOTH 2.2X20 (Screw) ×1 IMPLANT
PEG LOCKING SMOOTH 2.2X22 (Screw) ×2 IMPLANT
PEG LOCKING SMOOTH 2.2X24 (Peg) ×3 IMPLANT
PLATE STD DVR LEFT (Plate) ×2 IMPLANT
PLATE STD DVR LT 24X55 (Plate) IMPLANT
SCREW LOCK 16X2.7X 3 LD TPR (Screw) IMPLANT
SCREW LOCK 18X2.7X 3 LD TPR (Screw) IMPLANT
SCREW LOCKING 2.7X16 (Screw) ×4 IMPLANT
SCREW LOCKING 2.7X18 (Screw) ×2 IMPLANT
SLING ARM IMMOBILIZER LRG (SOFTGOODS) ×1 IMPLANT
SOAP 2 % CHG 4 OZ (WOUND CARE) ×2 IMPLANT
SPLINT FIBERGLASS 3X35 (CAST SUPPLIES) ×1 IMPLANT
SUT MNCRL AB 3-0 PS2 18 (SUTURE) ×1 IMPLANT
SUT PROLENE 3 0 PS 1 (SUTURE) ×1 IMPLANT
SUT PROLENE 4 0 PS 2 18 (SUTURE) IMPLANT
SUT VIC AB 2-0 FS1 27 (SUTURE) ×2 IMPLANT
SUT VICRYL 4-0 PS2 18IN ABS (SUTURE) IMPLANT
SYR CONTROL 10ML LL (SYRINGE) IMPLANT
SYSTEM CHEST DRAIN TLS 7FR (DRAIN) IMPLANT
TOWEL GREEN STERILE (TOWEL DISPOSABLE) ×2 IMPLANT
TOWEL GREEN STERILE FF (TOWEL DISPOSABLE) ×2 IMPLANT
TUBE CONNECTING 12X1/4 (SUCTIONS) ×2 IMPLANT
WATER STERILE IRR 1000ML POUR (IV SOLUTION) ×2 IMPLANT

## 2022-02-04 NOTE — H&P (Signed)
John [name] is an 61 y.o. male.   Chief Complaint:LEFT DISTAL RADIUS FRACTURE HPI: PT FELL ON LEFT WRIST, PT WITH COMMINUTED LEFT DISTAL RADIUS FRACTURE PT HERE FOR SURGERY ON LEFT WRIST  Past Medical History:  Diagnosis Date   AICD (automatic cardioverter/defibrillator) present    St. Jude   Anxiety    Ativan   Chronic combined systolic and diastolic CHF, NYHA class 3 (HCC) 10/2016   Nonischemic cardiomyopathy. EF 20-25%.   Chronic left hip pain    Depression    history of   Dysrhythmia    A-FIB   GI bleed 03/2016   Headache    Hypertensive heart disease with combined systolic and diastolic congestive heart failure (HCC) 10/2016   Nonischemic cardiomyopathy (HCC) 10/2016   Echo with EF 20-25%. Cardiac catheterization with no CAD. LVEDP was 41 mmHg, PCWP 36 mmHg   Osteoarthritis    right shoulder   Right hand fracture    Seasonal allergies    Sleep apnea    wears a CPAP   Wears glasses     Past Surgical History:  Procedure Laterality Date   CARDIAC CATHETERIZATION     CARDIOVERSION N/A 09/01/2019   Procedure: CARDIOVERSION;  Surgeon: Laurey Morale, MD;  Location: [facility] ENDOSCOPY;  Service: Cardiovascular;  Laterality: N/A;   ESOPHAGOGASTRODUODENOSCOPY (EGD) WITH PROPOFOL N/A 03/20/2016   Procedure: ESOPHAGOGASTRODUODENOSCOPY (EGD) WITH PROPOFOL;  Surgeon: Charlott Rakes, MD;  Location: [facility] ENDOSCOPY;  Service: Endoscopy;  Laterality: N/A;   ICD IMPLANT N/A 05/06/2018   Procedure: ICD IMPLANT;  Surgeon: Thurmon Fair, MD;  Location: [facility] INVASIVE CV LAB;  Service: Cardiovascular;  Laterality: N/A;   IRRIGATION AND DEBRIDEMENT SHOULDER Left 11/11/2019   Procedure: LEFT SHOULDER IRRIGATION AND DEBRIDEMENT WITH POLY EXCHANGE;  Surgeon: Jones Broom, MD;  Location: [facility] ORS;  Service: Orthopedics;  Laterality: Left;   KYPHOPLASTY N/A 02/22/2021   Procedure: KYPHOPLASTY T12;  Surgeon: Venita Lick, MD;  Location: [facility] OR;  Service: Orthopedics;  Laterality: N/A;  90 mins    RIGHT/LEFT HEART CATH AND CORONARY ANGIOGRAPHY N/A 10/21/2016   Procedure: Right/Left Heart Cath and Coronary Angiography;  Surgeon: Runell Gess, MD;  Location: [facility] INVASIVE CV LAB;  Service: Cardiovascular: Angiographically normal coronary arteries. PCWP 33-36 mmHg, LVEDP 41 mmHg. PA pressure 60/35, mean 46 mmHg.  Cardiac output/cardiac index-3.93 /1.76 Hiram Comber), 3.49/1.57 (thermodilution)   TEE WITHOUT CARDIOVERSION N/A 09/01/2019   Procedure: TRANSESOPHAGEAL ECHOCARDIOGRAM (TEE);  Surgeon: Laurey Morale, MD;  Location: [facility] ENDOSCOPY;  Service: Cardiovascular;  Laterality: N/A;   TOTAL HIP ARTHROPLASTY Left 03/27/2015   Procedure: LEFT TOTAL HIP ARTHROPLASTY ANTERIOR APPROACH;  Surgeon: Samson Frederic, MD;  Location: [facility] OR;  Service: Orthopedics;  Laterality: Left;   TOTAL HIP ARTHROPLASTY Right 05/03/2021   Procedure: TOTAL HIP ARTHROPLASTY ANTERIOR APPROACH;  Surgeon: Samson Frederic, MD;  Location: [facility] ORS;  Service: Orthopedics;  Laterality: Right;   TOTAL SHOULDER ARTHROPLASTY Right 11/24/2015   Procedure: RIGHT TOTAL SHOULDER ARTHROPLASTY;  Surgeon: Beverely Low, MD;  Location: [facility] OR;  Service: Orthopedics;  Laterality: Right;   TOTAL SHOULDER ARTHROPLASTY Left 10/19/2019   Procedure: REVERSE TOTAL SHOULDER ARTHROPLASTY;  Surgeon: Jones Broom, MD;  Location: [facility] ORS;  Service: Orthopedics;  Laterality: Left;   TRANSTHORACIC ECHOCARDIOGRAM  10/2016   EF 20-25%. Diffuse hypokinesis but akinesis of the entire inferoseptal wall and apical wall. Moderate biatrial enlargement. PA pressure estimated 64 mmHg.   WISDOM TOOTH EXTRACTION      Family History  Problem Relation Age of Onset  Dementia Mother        died at 36   Lung cancer Father        smoker   Congenital heart disease Sister        lived to 64    Healthy Brother    Healthy Sister    Prostate cancer Brother        possible cancer   Healthy Brother    Healthy Brother    Social History:  reports that he has never smoked. He  has never used smokeless tobacco. He reports current alcohol use of about 12.0 standard drinks per week. He reports that he does not use drugs.  Allergies:  Allergies  Allergen Reactions   Cymbalta [Duloxetine Hcl] Nausea Only and Other (See Comments)    Night sweats     Hydrocodone Rash    Medications Prior to Admission  Medication Sig Dispense Refill   acetaminophen (TYLENOL) 325 MG tablet Take 1-2 tablets (325-650 mg total) by mouth every 6 (six) hours as needed for mild pain (pain score 1-3 or temp > 100.5).     carvedilol (COREG) 25 MG tablet Take 25 mg by mouth 2 (two) times daily.     diphenhydramine-acetaminophen (TYLENOL PM) 25-500 MG TABS tablet Take 2 tablets by mouth at bedtime.     ELIQUIS 5 MG TABS tablet TAKE 1 TABLET BY MOUTH TWICE A DAY 180 tablet 3   ENTRESTO 97-103 MG TAKE 1 TABLET BY MOUTH TWICE A DAY 60 tablet 5   FARXIGA 10 MG TABS tablet TAKE 1 TABLET BY MOUTH DAILY BEFORE BREAKFAST 30 tablet 6   fluticasone (FLONASE) 50 MCG/ACT nasal spray Place 1 spray into both nostrils daily. Use twice a day until symptoms under control, then reduce to daily use. 16 g 2   hydrOXYzine (ATARAX) 25 MG tablet Take 25 mg by mouth as needed.     oxyCODONE-acetaminophen (PERCOCET/ROXICET) 5-325 MG tablet Take 1 tablet by mouth every 6 (six) hours as needed for severe pain. 12 tablet 0   rosuvastatin (CRESTOR) 20 MG tablet TAKE 1 TABLET BY MOUTH EVERY DAY 90 tablet 3   sertraline (ZOLOFT) 100 MG tablet Take 100 mg by mouth daily.     sildenafil (VIAGRA) 100 MG tablet TAKE 0.5 TABLETS (50 MG TOTAL) BY MOUTH AT BEDTIME AS NEEDED FOR ERECTILE DYSFUNCTION. 10 tablet 3   spironolactone (ALDACTONE) 50 MG tablet Take 1 tablet (50 mg total) by mouth daily. 90 tablet 3   torsemide (DEMADEX) 20 MG tablet TAKE 3 TABLETS BY MOUTH EVERY DAY 270 tablet 1   traZODone (DESYREL) 50 MG tablet TAKE 1 TABLET BY MOUTH EVERY DAY AT BEDTIME AS NEEDED 30 tablet 1    No results found. However, due to the size  of the patient record, not all encounters were searched. Please check Results Review for a complete set of results. No results found.  ROS NO RECENT ILLNESSES OR HOSPITALIZATIONS  Blood pressure 100/60, pulse 65, temperature 98.4 F (36.9 C), temperature source Oral, resp. rate 17, height 5\' 10"  (1.778 m), weight 122.5 kg, SpO2 97 %. Physical Exam  General Appearance:  Alert, cooperative, no distress, appears stated age  Head:  Normocephalic, without obvious abnormality, atraumatic  Eyes:  Pupils equal, conjunctiva/corneas clear,         Throat: Lips, mucosa, and tongue normal; teeth and gums normal  Neck: No visible masses     Lungs:   respirations unlabored  Chest Wall:  No tenderness or deformity  Heart:  Regular rate and rhythm,  Abdomen:   Soft, non-tender,         Extremities: LUE: skin intact finger warm well perfused Able to extend thumb  Pulses: 2+ and symmetric  Skin: Skin color, texture, turgor normal, no rashes or lesions     Neurologic: Normal    Assessment/Plan LEFT WRIST COMMINUTED DISTAL RADIUS FRACTURE, DISPLACED  LEFT WRIST OPEN REDUCTION AND INTERNAL FIXATION AND REPAIR AS INDICATED  R/B/A DISCUSSED WITH PT IN OFFICE.  PT VOICED UNDERSTANDING OF PLAN CONSENT SIGNED DAY OF SURGERY PT SEEN AND EXAMINED PRIOR TO OPERATIVE PROCEDURE/DAY OF SURGERY SITE MARKED. QUESTIONS ANSWERED WILL GO HOME FOLLOWING SURGERY  WE ARE PLANNING SURGERY FOR YOUR UPPER EXTREMITY. THE RISKS AND BENEFITS OF SURGERY INCLUDE BUT NOT LIMITED TO BLEEDING INFECTION, DAMAGE TO NEARBY NERVES ARTERIES TENDONS, FAILURE OF SURGERY TO ACCOMPLISH ITS INTENDED GOALS, PERSISTENT SYMPTOMS AND NEED FOR FURTHER SURGICAL INTERVENTION. WITH THIS IN MIND WE WILL PROCEED. I HAVE DISCUSSED WITH THE PATIENT THE PRE AND POSTOPERATIVE REGIMEN AND THE DOS AND DON'TS. PT VOICED UNDERSTANDING AND INFORMED CONSENT SIGNED.   Linna Hoff 02/04/2022, 2:02 PM

## 2022-02-04 NOTE — Transfer of Care (Signed)
Immediate Anesthesia Transfer of Care Note  Patient: John [name]  Procedure(s) Performed: OPEN REDUCTION INTERNAL FIXATION (ORIF) WRIST FRACTURE (Left: Wrist)  Patient Location: PACU  Anesthesia Type:General and Regional  Level of Consciousness: awake and alert   Airway & Oxygen Therapy: Patient Spontanous Breathing and Patient connected to face mask oxygen  Post-op Assessment: Report given to RN and Post -op Vital signs reviewed and stable  Post vital signs: Reviewed and stable  Last Vitals:  Vitals Value Taken Time  BP 107/56 02/04/22 1815  Temp 36.2 C 02/04/22 1815  Pulse 65 02/04/22 1817  Resp 16 02/04/22 1817  SpO2 97 % 02/04/22 1817  Vitals shown include unvalidated device data.  Last Pain:  Vitals:   02/04/22 1404  TempSrc:   PainSc: 10-Worst pain ever         Complications: No notable events documented.

## 2022-02-04 NOTE — Anesthesia Procedure Notes (Signed)
Procedure Name: MAC Date/Time: 02/04/2022 4:45 PM Performed by: Lorie Phenix, CRNA Pre-anesthesia Checklist: Patient identified, Emergency Drugs available, Suction available and Patient being monitored Oxygen Delivery Method: Simple face mask Placement Confirmation: positive ETCO2

## 2022-02-04 NOTE — Anesthesia Postprocedure Evaluation (Signed)
Anesthesia Post Note  Patient: John [name]  Procedure(s) Performed: OPEN REDUCTION INTERNAL FIXATION (ORIF) WRIST FRACTURE (Left: Wrist)     Patient location during evaluation: PACU Anesthesia Type: Regional and General Level of consciousness: sedated and patient cooperative Pain management: pain level controlled Vital Signs Assessment: post-procedure vital signs reviewed and stable Respiratory status: spontaneous breathing Cardiovascular status: stable Anesthetic complications: no   No notable events documented.  Last Vitals:  Vitals:   02/04/22 1830 02/04/22 1845  BP: (!) 110/52 (!) 108/50  Pulse: 68 66  Resp: 15 20  Temp:  36.7 C  SpO2: 95% 97%    Last Pain:  Vitals:   02/04/22 1845  TempSrc:   PainSc: 0-No pain                 Nolon Nations

## 2022-02-04 NOTE — Anesthesia Preprocedure Evaluation (Addendum)
Anesthesia Evaluation  Patient identified by MRN, date of birth, ID band Patient awake    Reviewed: Allergy & Precautions, NPO status , Patient's Chart, lab work & pertinent test results  Airway Mallampati: IV  TM Distance: >3 FB Neck ROM: Full    Dental  (+) Teeth Intact, Dental Advisory Given   Pulmonary sleep apnea (has been broken) and Continuous Positive Airway Pressure Ventilation ,    Pulmonary exam normal breath sounds clear to auscultation       Cardiovascular hypertension, Pt. on medications and Pt. on home beta blockers +CHF (LVEF 92-33%, grade 1 diastolic dysfunction)  Normal cardiovascular exam+ dysrhythmias (eliquis LA 3d ago) Atrial Fibrillation + Cardiac Defibrillator  Rhythm:Regular Rate:Normal  Echo 01/2022 1. Left ventricular ejection fraction, by estimation, is 30 to 35%. The  left ventricle has moderately decreased function. The left ventricle  demonstrates global hypokinesis. There is mild left ventricular  hypertrophy. Left ventricular diastolic  parameters are consistent with Grade I diastolic dysfunction (impaired  relaxation). The average left ventricular global longitudinal strain is  -14.5 %. The global longitudinal strain is abnormal.  2. Right ventricular systolic function is normal. The right ventricular  size is normal.  3. Left atrial size was mildly dilated.  4. Right atrial size was mildly dilated.  5. The mitral valve is normal in structure. No evidence of mitral valve  regurgitation. No evidence of mitral stenosis.  6. The aortic valve is tricuspid. Aortic valve regurgitation is not  visualized. No aortic stenosis is present.  7. The inferior vena cava is normal in size with <50% respiratory  variability, suggesting right atrial pressure of 8 mmHg.    Neuro/Psych  Headaches, PSYCHIATRIC DISORDERS Anxiety Depression    GI/Hepatic negative GI ROS, (+)       alcohol use, Hx etoh  abuse in past   Endo/Other  negative endocrine ROS  Renal/GU Renal InsufficiencyRenal diseaseCr 1.3  negative genitourinary   Musculoskeletal  (+) Arthritis , Osteoarthritis,  R wrist fx   Abdominal (+) + obese,   Peds  Hematology negative hematology ROS (+)   Anesthesia Other Findings   Reproductive/Obstetrics negative OB ROS                            Anesthesia Physical Anesthesia Plan  ASA: 3  Anesthesia Plan: MAC and Regional   Post-op Pain Management: Regional block*   Induction:   PONV Risk Score and Plan: 2 and Propofol infusion and TIVA  Airway Management Planned: Natural Airway and Simple Face Mask  Additional Equipment: None  Intra-op Plan:   Post-operative Plan:   Informed Consent: I have reviewed the patients History and Physical, chart, labs and discussed the procedure including the risks, benefits and alternatives for the proposed anesthesia with the patient or authorized representative who has indicated his/her understanding and acceptance.     Dental advisory given  Plan Discussed with: CRNA  Anesthesia Plan Comments:        Anesthesia Quick Evaluation

## 2022-02-04 NOTE — Discharge Instructions (Addendum)
KEEP BANDAGE CLEAN AND DRY CALL OFFICE FOR F/U APPT [phone] IN 2 WEEKS KEEP HAND ELEVATED ABOVE HEART RX sent to [address] [place] TO APPLY ICE TO OPERATIVE AREA CONTACT OFFICE IF ANY WORSENING PAIN OR CONCERNS.

## 2022-02-04 NOTE — Op Note (Signed)
PREOPERATIVE DIAGNOSIS: Left wrist intra-articular distal radius fracture 3 more fragments  POSTOPERATIVE DIAGNOSIS: Same  ATTENDING SURGEON: Dr. Iran Planas who scrubbed and present for the entire procedure  ASSISTANT SURGEON: None  ANESTHESIA: Regional and general anesthesia via LMA  OPERATIVE PROCEDURE: Open reduction internal fixation of displaced intra-articular distal radius fracture left wrist Left wrist brachial radialis tendon release, tendon tenotomy Radiographs 3 views left wrist  IMPLANTS: Biomet DVR volar rim cross lock standard  EBL: Minimal  RADIOGRAPHIC INTERPRETATION: AP lateral oblique views of the wrist do show the volar plate fixation place in good position  SURGICAL INDICATIONS: Patient is a right-hand-dominant gentleman who sustained the injury to his left wrist.  Patient was seen and evaluated the office and recommend undergo the above procedure.  Risks of surgery include but not limited to bleeding infection damage nearby nerves arteries or tendons nonunion malunion hardware failure loss of motion of the wrist and digits incomplete relief of symptoms and need for further surgical invention.  Signed informed consent was obtained on the day of surgery.  SURGICAL TECHNIQUE: The patient was prepped identified in the preoperative holding area marked apart a marker made the left wrist indicate correct operative site.  Patient brought back operating placed supine on the anesthesia table with her regional and general anesthetic was administered.  Preoperative antibiotics were given prior to skin incision.  A well-padded tourniquet placed on the left brachium and stay with the appropriate drape.  The left upper extremities then prepped and draped normal sterile fashion.  A timeout was called the correct site was identified procedure then begun.  Attention was then turned to the left wrist the limb was then elevated using Esmarch semination the tourniquet insufflated.   Longitudinal incision made directly over the FCR sheath.  Dissection carried down through the skin and subcutaneous tissue.  The FCR sheath was then opened proximally distally.  Bluntly going down to the floor the pronator quadratus and sweeping the FPL out of the way the pronator quadratus was elevated in an L-shaped fashion.  The fracture site was then carefully exposed.  The ulnar column was then exposed.  In order to expose the radial column careful release of the brachial radialis tendon was then done.  Tendon tenotomy was done.  This was a separate intervention.  Following this open reduction was then allowed to be completed.  The volar plate was then applied and plate height was then adjusted using the oblong screw hole proximally.  K wires held the fracture distally.  After confirmation of plate position distal fixation was carried out of the intra-articular fracture of 3 more fragments from an ulnar to radial direction.  This was done with distal locking pegs and 1 distal locking screw.  Following screw fixation distally final shaft fixation was then carried out proximally with combination of locking nonlocking screws.  The wound was then thoroughly irrigated.  Final radiographs were then obtained.  The pronator quadratus was then closed with 2-0 Vicryl suture.  Subcutaneous tissues closed with 3-0 Monocryl.  Skin closed with a combination of 3 oh horizontal mattress and Prolene suture.  Adaptic dressing sterile compressive bandage then applied.  The patient is then placed in a well-padded sugar-tong splint patient was then extubated taken recovery room in good condition.  POSTOPERATIVE PLAN: Patient be discharged to home.  See him back in the office in 2 weeks for wound check suture removal x-rays application of a short arm cast placed the therapy order begin a therapy regimen  at the 4-week mark.  Radiographs at each visit.

## 2022-02-04 NOTE — Progress Notes (Signed)
Dr. Salvadore Farber notified of low blood pressure. bolus of LR administered at 1540

## 2022-02-04 NOTE — Anesthesia Procedure Notes (Signed)
Anesthesia Regional Block: Supraclavicular block   Pre-Anesthetic Checklist: , timeout performed,  Correct Patient, Correct Site, Correct Laterality,  Correct Procedure, Correct Position, site marked,  Risks and benefits discussed,  Surgical consent,  Pre-op evaluation,  At surgeon's request and post-op pain management  Laterality: Left  Prep: Maximum Sterile Barrier Precautions used, chloraprep       Needles:  Injection technique: Single-shot  Needle Type: Echogenic Stimulator Needle     Needle Length: 9cm  Needle Gauge: 22     Additional Needles:   Procedures:,,,, ultrasound used (permanent image in chart),,    Narrative:  Start time: 02/04/2022 3:00 PM End time: 02/04/2022 3:10 PM Injection made incrementally with aspirations every 5 mL.  Performed by: Personally  Anesthesiologist: Lannie Fields, DO  Additional Notes: Monitors applied. No increased pain on injection. No increased resistance to injection. Injection made in 5cc increments. Good needle visualization. Patient tolerated procedure well.

## 2022-02-04 NOTE — Anesthesia Procedure Notes (Signed)
Procedure Name: LMA Insertion Date/Time: 02/04/2022 5:14 PM Performed by: Zollie Beckers, CRNA Pre-anesthesia Checklist: Patient identified, Emergency Drugs available, Suction available and Patient being monitored Patient Re-evaluated:Patient Re-evaluated prior to induction Oxygen Delivery Method: Circle System Utilized Preoxygenation: Pre-oxygenation with 100% oxygen Induction Type: IV induction LMA: LMA inserted LMA Size: 5.0 Number of attempts: 1 Placement Confirmation: positive ETCO2 Tube secured with: Tape Dental Injury: Teeth and Oropharynx as per pre-operative assessment

## 2022-02-05 ENCOUNTER — Encounter (HOSPITAL_COMMUNITY): Payer: Self-pay | Admitting: Orthopedic Surgery

## 2022-02-07 ENCOUNTER — Ambulatory Visit (HOSPITAL_BASED_OUTPATIENT_CLINIC_OR_DEPARTMENT_OTHER): Payer: Self-pay | Admitting: Physical Therapy

## 2022-02-08 ENCOUNTER — Ambulatory Visit (HOSPITAL_BASED_OUTPATIENT_CLINIC_OR_DEPARTMENT_OTHER): Payer: Self-pay | Admitting: Physical Therapy

## 2022-02-13 ENCOUNTER — Ambulatory Visit (HOSPITAL_BASED_OUTPATIENT_CLINIC_OR_DEPARTMENT_OTHER): Payer: Commercial Managed Care - HMO | Attending: Cardiology | Admitting: Cardiology

## 2022-02-13 ENCOUNTER — Ambulatory Visit (HOSPITAL_BASED_OUTPATIENT_CLINIC_OR_DEPARTMENT_OTHER): Payer: Self-pay | Admitting: Physical Therapy

## 2022-02-13 DIAGNOSIS — G4733 Obstructive sleep apnea (adult) (pediatric): Secondary | ICD-10-CM | POA: Diagnosis not present

## 2022-02-13 DIAGNOSIS — G4734 Idiopathic sleep related nonobstructive alveolar hypoventilation: Secondary | ICD-10-CM | POA: Diagnosis not present

## 2022-02-13 DIAGNOSIS — G4731 Primary central sleep apnea: Secondary | ICD-10-CM | POA: Insufficient documentation

## 2022-02-13 DIAGNOSIS — I10 Essential (primary) hypertension: Secondary | ICD-10-CM | POA: Diagnosis not present

## 2022-02-14 NOTE — Progress Notes (Signed)
Remote ICD transmission.   

## 2022-02-15 ENCOUNTER — Ambulatory Visit (HOSPITAL_BASED_OUTPATIENT_CLINIC_OR_DEPARTMENT_OTHER): Payer: Self-pay | Admitting: Physical Therapy

## 2022-02-15 NOTE — Procedures (Signed)
   Patient Name: John, [name] Date: 02/13/2022 Gender: Male D.O.B: 10/25/1960 Age (years): 40 Referring Provider: Fransico Him MD, ABSM Height (inches): 71 Interpreting Physician: Fransico Him MD, ABSM Weight (lbs): 275 RPSGT: Carolin Coy BMI: 38 MRN: [id number] Neck Size: 20.00  CLINICAL INFORMATION The patient is referred for a CPAP titration to treat sleep apnea.  SLEEP STUDY TECHNIQUE As per the AASM Manual for the Scoring of Sleep and Associated Events v2.3 (April 2016) with a hypopnea requiring 4% desaturations.  The channels recorded and monitored were frontal, central and occipital EEG, electrooculogram (EOG), submentalis EMG (chin), nasal and oral airflow, thoracic and abdominal wall motion, anterior tibialis EMG, snore microphone, electrocardiogram, and pulse oximetry. Bilevel positive airway pressure (BPAP) was initiated at the beginning of the study and titrated to treat sleep-disordered breathing.  MEDICATIONS Medications self-administered by patient taken the night of the study : OXYCODONE HCL & ACETAMINOPHEN  RESPIRATORY PARAMETERS Optimal IPAP Pressure (cm): N/A  AHI at Optimal Pressure (/hr) N/A Optimal EPAP Pressure (cm):N/A  Overall Minimal O2 (%):84.0  Minimal O2 at Optimal Pressure (%): N/A  SLEEP ARCHITECTURE Start Time:10:13:53 PM  Stop Time:4:27:50 AM  Total Time (min):374  Total Sleep Time (min):249.8 Sleep Latency (min):30.7  Sleep Efficiency (%):66.8%  REM Latency (min):N/A  WASO (min):93.5 Stage N1 (%): 36.3%  Stage N2 (%): 63.7%  Stage N3 (%):0.0%  Stage R (%): 0 Supine (%):100.00  Arousal Index (/hr):51.4   CARDIAC DATA The 2 lead EKG demonstrated sinus rhythm. The mean heart rate was 58.8 beats per minute. Other EKG findings include: PVCs.  LEG MOVEMENT DATA The total Periodic Limb Movements of Sleep (PLMS) were 0. The PLMS index was 0.0. A PLMS index of <15 is considered normal in adults.  IMPRESSIONS - An optimal  PAP pressure could not be selected due to ongoing respiratory events. - Severe Central Sleep Apnea was noted during this titration (CAI = 30/h). - Moderete oxygen desaturations were observed during this titration (min O2 = 84.0%). - The patient snored with moderate snoring volume. - No cardiac abnormalities were observed during this study. - Clinically significant periodic limb movements were not noted during this study. Arousals associated with PLMs were rare.  DIAGNOSIS - Obstructive Sleep Apnea (G47.33) - Central Sleep Apnea  RECOMMENDATIONS - Recommend full night BiPAP titration.  - Avoid alcohol, sedatives and other CNS depressants that may worsen sleep apnea and disrupt normal sleep architecture. - Sleep hygiene should be reviewed to assess factors that may improve sleep quality. - Weight management and regular exercise should be initiated or continued.  [Electronically signed] 02/15/2022 12:59 PM  Fransico Him MD, ABSM Diplomate, American Board of Sleep Medicine

## 2022-02-20 ENCOUNTER — Telehealth: Payer: Self-pay | Admitting: *Deleted

## 2022-02-20 ENCOUNTER — Ambulatory Visit (HOSPITAL_BASED_OUTPATIENT_CLINIC_OR_DEPARTMENT_OTHER): Payer: Commercial Managed Care - HMO | Admitting: Physical Therapy

## 2022-02-20 DIAGNOSIS — G4733 Obstructive sleep apnea (adult) (pediatric): Secondary | ICD-10-CM

## 2022-02-20 NOTE — Telephone Encounter (Signed)
RETURN CALL: The patient has been notified of the result and verbalized understanding.  All questions (if any) were answered. Latrelle Dodrill, CMA 02/20/2022 3:59 PM    Patient ask to have his study expedited. It has to be pre-certed wait 15 business days for insurance to make a decision then it is sent to the sleep lab for scheduling. I will ask the sleep lab to move him up on the list once he is ready for scheduling.

## 2022-02-20 NOTE — Telephone Encounter (Signed)
-----   Message from Lauralee Evener, [place] sent at 02/15/2022  1:32 PM EDT -----  ----- Message ----- From: Sueanne Margarita, MD Sent: 02/15/2022   1:32 PM EDT To: Cv Div Sleep Studies  Unsuccessful CPAP titration - please send to sleep lab for BiPAP titration - may need BiPAP ST

## 2022-02-20 NOTE — Addendum Note (Signed)
Addended by: Reesa Chew on: 02/20/2022 04:12 PM   Modules accepted: Orders

## 2022-02-20 NOTE — Telephone Encounter (Signed)
The patient has been notified of the result via his mychart left a message on his voicemail also.Latrelle Dodrill, CMA 02/20/2022 2:44 PM    Bipap to precert/BiPAP titration - may need BiPAP ST

## 2022-02-22 ENCOUNTER — Ambulatory Visit (HOSPITAL_BASED_OUTPATIENT_CLINIC_OR_DEPARTMENT_OTHER): Payer: Self-pay | Admitting: Physical Therapy

## 2022-02-25 ENCOUNTER — Encounter: Payer: Self-pay | Admitting: Infectious Diseases

## 2022-02-26 NOTE — Telephone Encounter (Signed)
Prior Authorization for BIPAP S/T sent to [facility] via Phone.  CASE# [id number].

## 2022-03-08 ENCOUNTER — Other Ambulatory Visit (HOSPITAL_COMMUNITY): Payer: Self-pay | Admitting: Cardiology

## 2022-03-12 ENCOUNTER — Telehealth: Payer: Self-pay | Admitting: Family Medicine

## 2022-03-12 NOTE — Telephone Encounter (Signed)
I reviewed Meds I have prescribed in the past and I do not see that I have prescribed this -He could certainly try clotrimazole also known as Lotrimin over-the-counter for up to 2 weeks-that would likely help just like any medication I can provide

## 2022-03-13 NOTE — Telephone Encounter (Signed)
Called and spoke with pt and pt states he has purchased this otc.

## 2022-03-17 IMAGING — DX DG HIP (WITH OR WITHOUT PELVIS) 2-3V*R*
3 series · 3 of 3 positions shown · non-contrast
Comparison: None.

CLINICAL DATA: Right leg pain for 1 month

EXAM:
DG HIP (WITH OR WITHOUT PELVIS) 2-3V RIGHT

[pelvis ap]
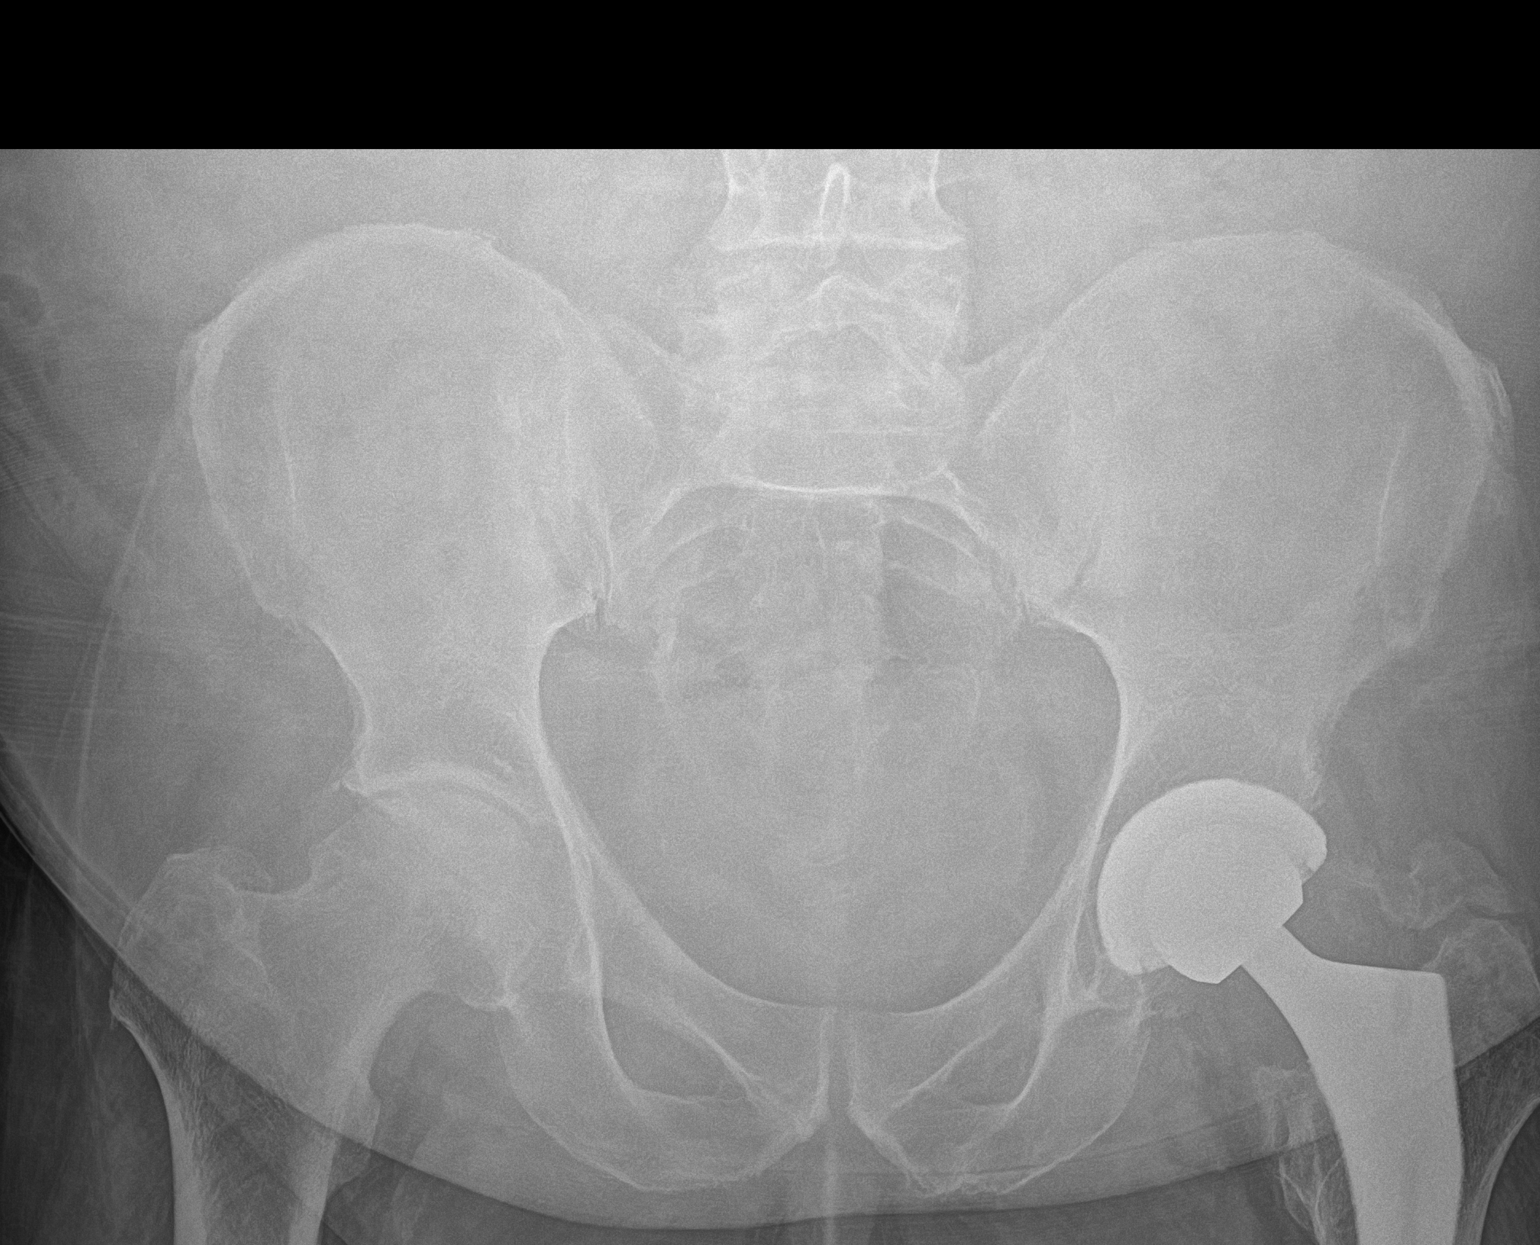

[hip ap]
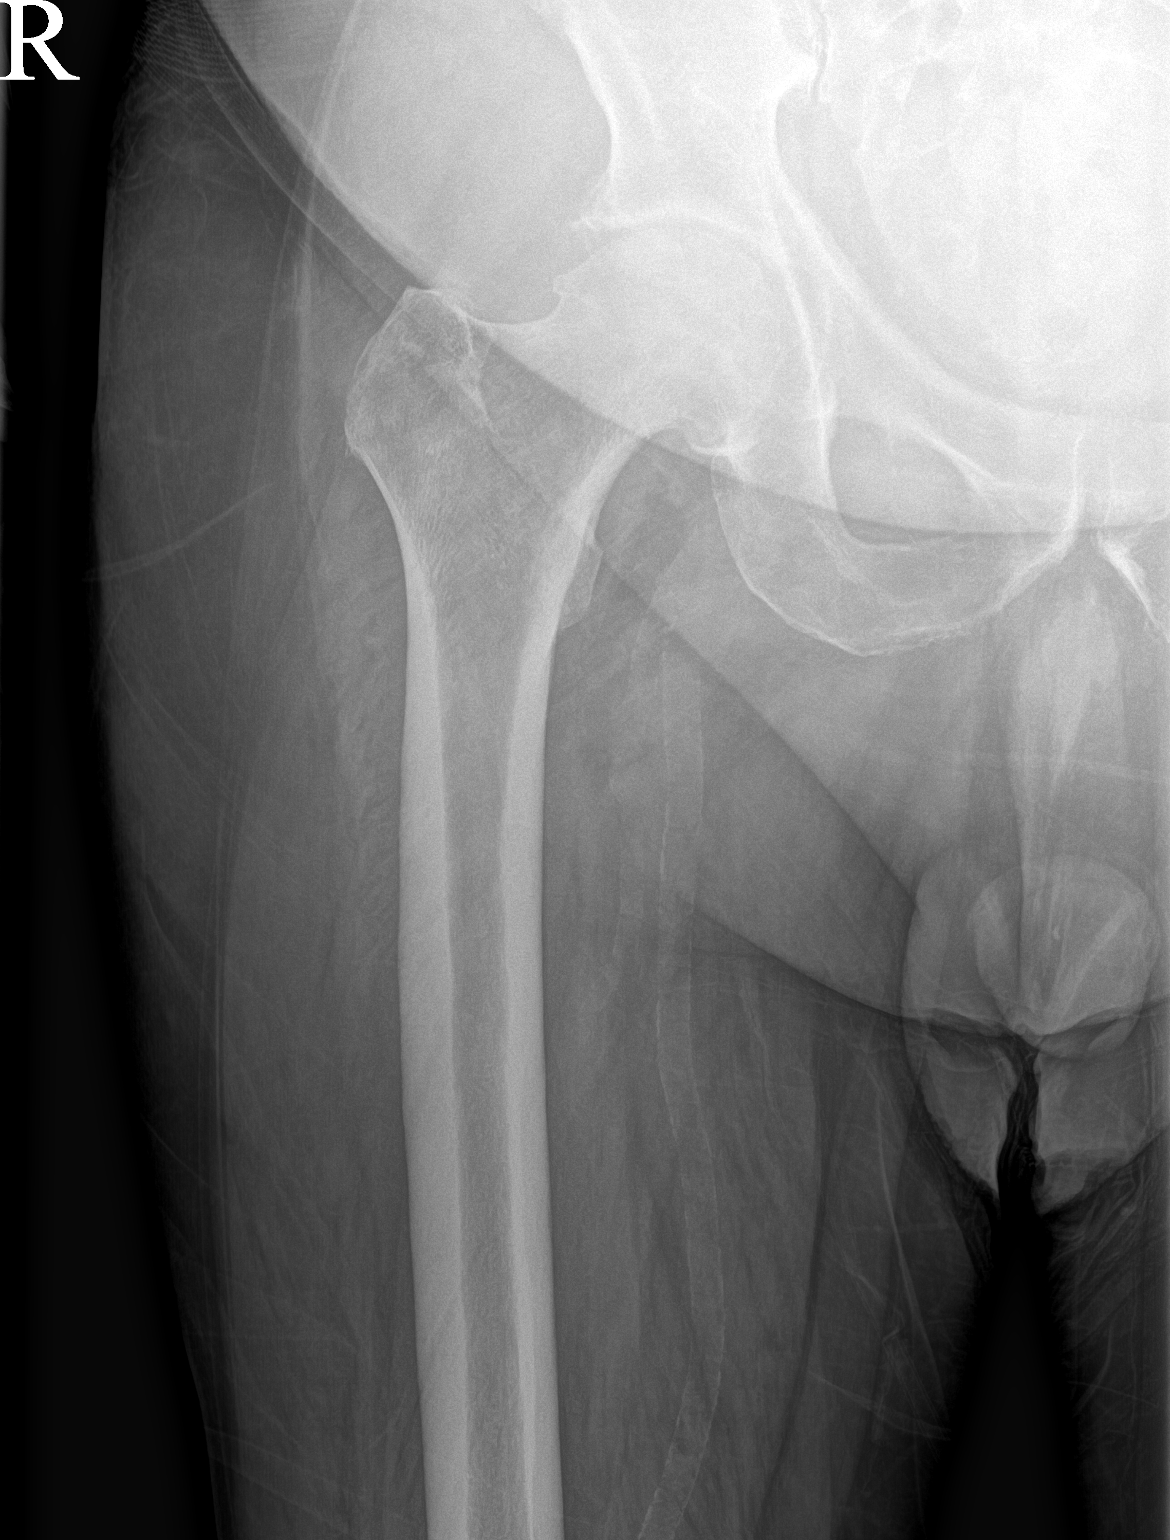

[hip frog leg]
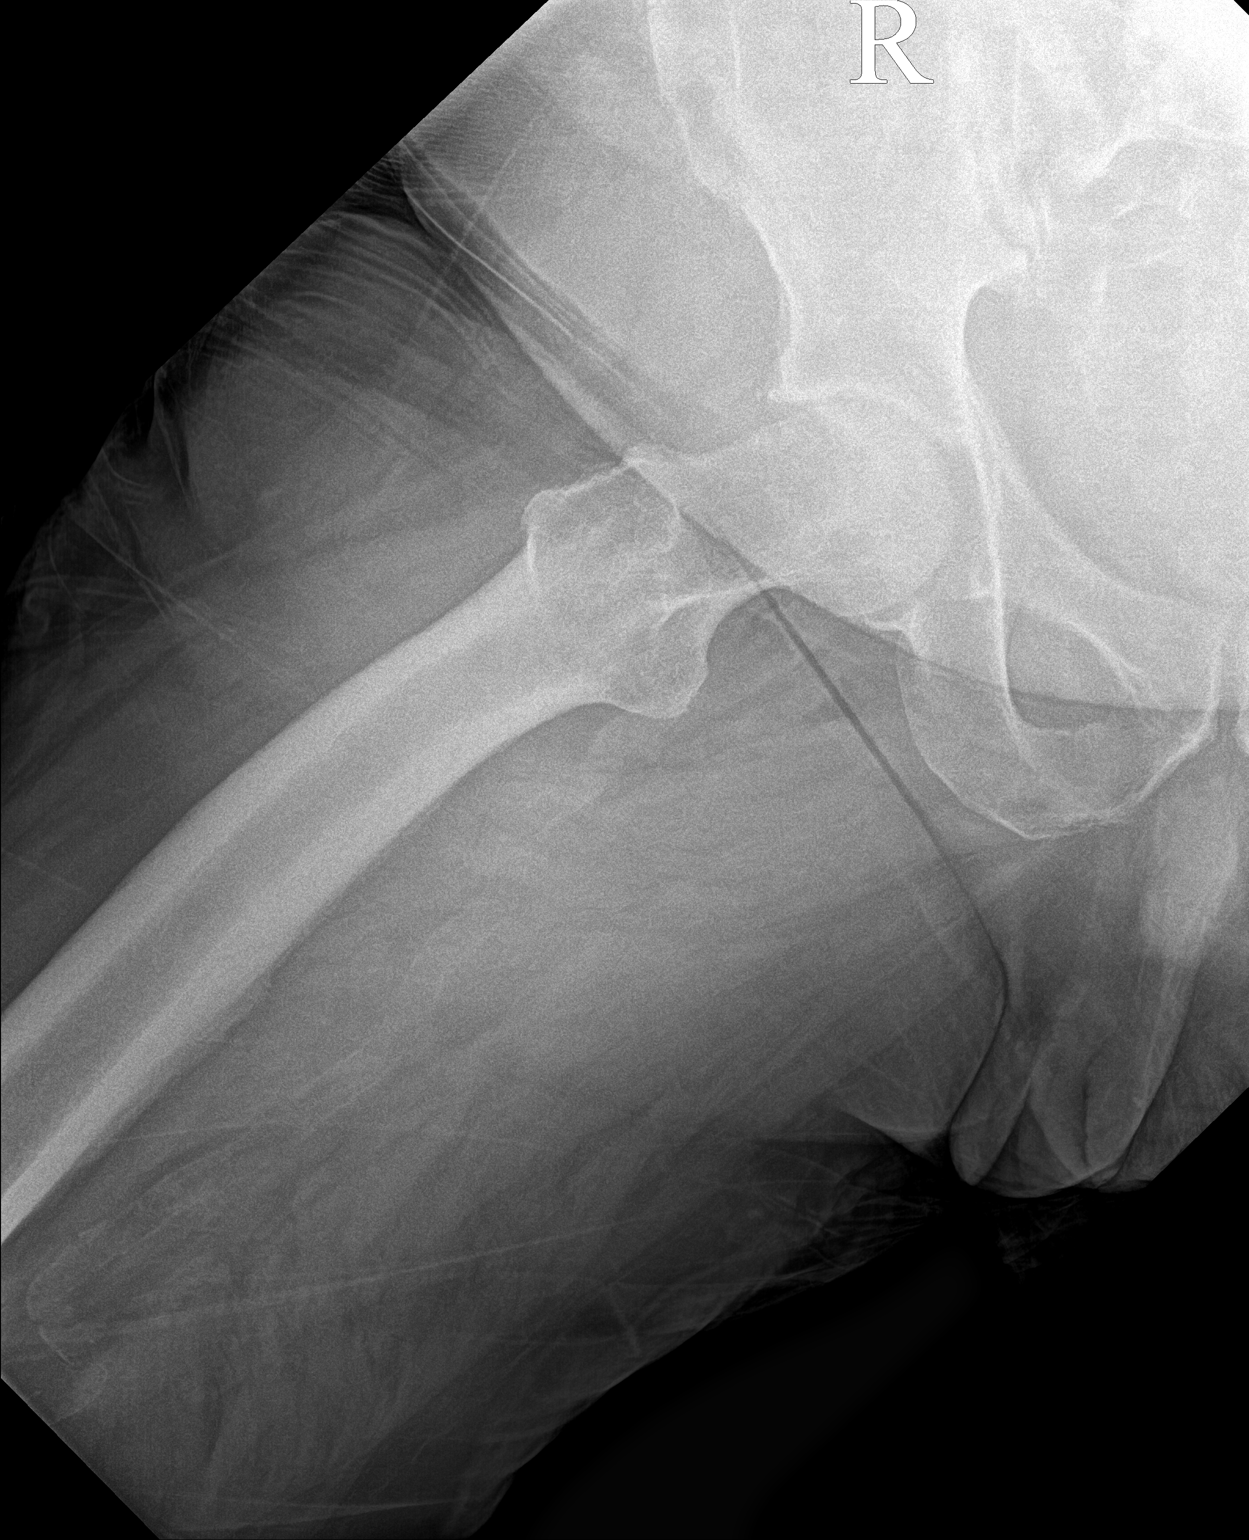

[3 of 3 positions shown; findings below may reference images not displayed]

FINDINGS: Frontal view of the pelvis as well as frontal and frogleg lateral
views of the right hip are obtained. There is moderate to severe
right hip osteoarthritis with joint space narrowing and
circumferential osteophyte formation. Left hip arthroplasty is
identified without evidence of complication. Sacroiliac joints are
normal. There are no acute fractures.
IMPRESSION: 1. Moderate to severe right hip osteoarthritis. No acute bony
abnormality.

## 2022-03-27 ENCOUNTER — Ambulatory Visit (HOSPITAL_BASED_OUTPATIENT_CLINIC_OR_DEPARTMENT_OTHER): Payer: Commercial Managed Care - HMO | Attending: Cardiology | Admitting: Cardiology

## 2022-03-27 DIAGNOSIS — I493 Ventricular premature depolarization: Secondary | ICD-10-CM | POA: Diagnosis not present

## 2022-03-27 DIAGNOSIS — R008 Other abnormalities of heart beat: Secondary | ICD-10-CM | POA: Diagnosis not present

## 2022-03-27 DIAGNOSIS — G4733 Obstructive sleep apnea (adult) (pediatric): Secondary | ICD-10-CM | POA: Diagnosis not present

## 2022-03-28 ENCOUNTER — Encounter (HOSPITAL_BASED_OUTPATIENT_CLINIC_OR_DEPARTMENT_OTHER): Payer: Self-pay | Admitting: Physical Therapy

## 2022-03-28 ENCOUNTER — Ambulatory Visit (HOSPITAL_BASED_OUTPATIENT_CLINIC_OR_DEPARTMENT_OTHER): Payer: Commercial Managed Care - HMO | Attending: Orthopedic Surgery | Admitting: Physical Therapy

## 2022-03-28 ENCOUNTER — Other Ambulatory Visit: Payer: Self-pay

## 2022-03-28 DIAGNOSIS — M5459 Other low back pain: Secondary | ICD-10-CM | POA: Insufficient documentation

## 2022-03-28 DIAGNOSIS — R262 Difficulty in walking, not elsewhere classified: Secondary | ICD-10-CM | POA: Insufficient documentation

## 2022-03-28 DIAGNOSIS — M6281 Muscle weakness (generalized): Secondary | ICD-10-CM | POA: Insufficient documentation

## 2022-03-28 NOTE — Therapy (Signed)
OUTPATIENT PHYSICAL THERAPY THORACOLUMBAR RE-EVALUATION   Patient Name: John [name] MRN: [id number] DOB:May 11, 1961, 61 y.o., male Today's Date: 03/28/2022   PT End of Session - 03/28/22 0849     Visit Number 2    Number of Visits 22    Date for PT Re-Evaluation 06/26/22    Authorization Type [employer]    PT Start Time [phone]    PT Stop Time 0930    PT Time Calculation (min) 40 min    Activity Tolerance Patient tolerated treatment well    Behavior During Therapy [facility] for tasks assessed/performed              Past Medical History:  Diagnosis Date   AICD (automatic cardioverter/defibrillator) present    [employer]   Anxiety    Ativan   Chronic combined systolic and diastolic CHF, NYHA class 3 (HCC) 10/2016   Nonischemic cardiomyopathy. EF 20-25%.   Chronic left hip pain    Depression    history of   Dysrhythmia    A-FIB   GI bleed 03/2016   Headache    Hypertensive heart disease with combined systolic and diastolic congestive heart failure (HCC) 10/2016   Nonischemic cardiomyopathy (HCC) 10/2016   Echo with EF 20-25%. Cardiac catheterization with no CAD. LVEDP was 41 mmHg, PCWP 36 mmHg   Osteoarthritis    right shoulder   Right hand fracture    Seasonal allergies    Sleep apnea    wears a CPAP   Wears glasses    Past Surgical History:  Procedure Laterality Date   CARDIAC CATHETERIZATION     CARDIOVERSION N/A 09/01/2019   Procedure: CARDIOVERSION;  Surgeon: Laurey Morale, MD;  Location: [facility] ENDOSCOPY;  Service: Cardiovascular;  Laterality: N/A;   ESOPHAGOGASTRODUODENOSCOPY (EGD) WITH PROPOFOL N/A 03/20/2016   Procedure: ESOPHAGOGASTRODUODENOSCOPY (EGD) WITH PROPOFOL;  Surgeon: Charlott Rakes, MD;  Location: [facility] ENDOSCOPY;  Service: Endoscopy;  Laterality: N/A;   ICD IMPLANT N/A 05/06/2018   Procedure: ICD IMPLANT;  Surgeon: Thurmon Fair, MD;  Location: [facility] INVASIVE CV LAB;  Service: Cardiovascular;  Laterality: N/A;   IRRIGATION AND DEBRIDEMENT SHOULDER Left  11/11/2019   Procedure: LEFT SHOULDER IRRIGATION AND DEBRIDEMENT WITH POLY EXCHANGE;  Surgeon: Jones Broom, MD;  Location: [facility] ORS;  Service: Orthopedics;  Laterality: Left;   KYPHOPLASTY N/A 02/22/2021   Procedure: KYPHOPLASTY T12;  Surgeon: Venita Lick, MD;  Location: [facility] OR;  Service: Orthopedics;  Laterality: N/A;  90 mins   ORIF WRIST FRACTURE Left 02/04/2022   Procedure: OPEN REDUCTION INTERNAL FIXATION (ORIF) WRIST FRACTURE;  Surgeon: Bradly Bienenstock, MD;  Location: [facility] OR;  Service: Orthopedics;  Laterality: Left;  with IV sedation   RIGHT/LEFT HEART CATH AND CORONARY ANGIOGRAPHY N/A 10/21/2016   Procedure: Right/Left Heart Cath and Coronary Angiography;  Surgeon: Runell Gess, MD;  Location: [facility] INVASIVE CV LAB;  Service: Cardiovascular: Angiographically normal coronary arteries. PCWP 33-36 mmHg, LVEDP 41 mmHg. PA pressure 60/35, mean 46 mmHg.  Cardiac output/cardiac index-3.93 /1.76 Hiram Comber), 3.49/1.57 (thermodilution)   TEE WITHOUT CARDIOVERSION N/A 09/01/2019   Procedure: TRANSESOPHAGEAL ECHOCARDIOGRAM (TEE);  Surgeon: Laurey Morale, MD;  Location: [facility] ENDOSCOPY;  Service: Cardiovascular;  Laterality: N/A;   TOTAL HIP ARTHROPLASTY Left 03/27/2015   Procedure: LEFT TOTAL HIP ARTHROPLASTY ANTERIOR APPROACH;  Surgeon: Samson Frederic, MD;  Location: [facility] OR;  Service: Orthopedics;  Laterality: Left;   TOTAL HIP ARTHROPLASTY Right 05/03/2021   Procedure: TOTAL HIP ARTHROPLASTY ANTERIOR APPROACH;  Surgeon: Samson Frederic, MD;  Location: [facility] ORS;  Service:  Orthopedics;  Laterality: Right;   TOTAL SHOULDER ARTHROPLASTY Right 11/24/2015   Procedure: RIGHT TOTAL SHOULDER ARTHROPLASTY;  Surgeon: Beverely Low, MD;  Location: [facility] OR;  Service: Orthopedics;  Laterality: Right;   TOTAL SHOULDER ARTHROPLASTY Left 10/19/2019   Procedure: REVERSE TOTAL SHOULDER ARTHROPLASTY;  Surgeon: Jones Broom, MD;  Location: [facility] ORS;  Service: Orthopedics;  Laterality: Left;   TRANSTHORACIC ECHOCARDIOGRAM  10/2016   EF  20-25%. Diffuse hypokinesis but akinesis of the entire inferoseptal wall and apical wall. Moderate biatrial enlargement. PA pressure estimated 64 mmHg.   WISDOM TOOTH EXTRACTION     Patient Active Problem List   Diagnosis Date Noted   Persistent cough 08/27/2021   Acute conjunctivitis of right eye 08/27/2021   Osteoarthritis of right hip 05/03/2021   Osteoarthritis of left hip 05/03/2021   Aortic atherosclerosis (HCC) 03/22/2021   Thoracic compression fracture (HCC) 02/22/2021   Frequent falls 02/16/2021   Hyponatremia 02/16/2021   Acute anemia 02/16/2021   Hypotension 02/16/2021   ETOH abuse 02/16/2021   GAD (generalized anxiety disorder) 05/31/2020   Paroxysmal atrial fibrillation (HCC) 04/14/2020   S/P ICD (internal cardiac defibrillator) procedure 04/14/2020   Status post reverse total shoulder replacement, left 11/11/2019   Hyperlipidemia 05/27/2018   Unspecified fracture of upper end of left humerus, initial encounter for closed fracture 09/03/2017   Essential hypertension 11/08/2016   Non-ischemic cardiomyopathy (HCC)    Chronic combined systolic and diastolic CHF, NYHA class 3 (HCC) 10/2016   GI bleed 03/19/2016   Alcohol dependence in remission (HCC) 03/19/2016   S/P shoulder replacement 11/24/2015   Allergic rhinitis 08/21/2015   ED (erectile dysfunction) 08/21/2015   Hyperglycemia 08/21/2015   Insomnia 08/21/2015   OSA (obstructive sleep apnea) 08/21/2015   Degenerative joint disease of left hip 03/27/2015    PCP: Shelva Majestic, MD  REFERRING PROVIDER: Shelva Majestic, MD  REFERRING DIAG: [phone] (ICD-10-CM) - Encounter for other specified surgical aftercare  THERAPY DIAG:  Other low back pain - Plan: PT plan of care cert/re-cert  Muscle weakness (generalized) - Plan: PT plan of care cert/re-cert  Difficulty walking - Plan: PT plan of care cert/re-cert  ONSET DATE: 2022  SUBJECTIVE:                                                                                                                                                                                            SUBJECTIVE STATEMENT:  Pt states that the hip feels much better at this time. His LBP continues to bother him. He had to cancel his last PT session(s) due to breaking the L wrist. Pt states he tripped in  the parking lot and had a [place] type injury.  Pt was at home and tripped again and landed on the hand again. Pt had plates and screws into the L wrist.   His back pain is still bothering him with walking, bending, lifting, showering. Pt states the back pain is from the back and up. He does not have NT or radiating pain down the legs. He states that moving/walking makes it more painful. Pt is still sedentary for large parts of the day.  Walking causes pain immediately. Pt states the R hip doesn't bother him walking anymore. Pt will feeling of legs giving out occasionally but will not happen in both legs, feels more like pain that causes them to give out.    Previous:  Pt states he is here for his low back and his R hip. He had his  R hip replaced and the surgeon did not order PT for it. He feels that it is weaker than it shoulder be. He states that during [place], he gained a lot of weight which might be exacerbating the pain. He is unable to exercise now due to the pain and R hip. Pt would like to consider aquatic therapy here at [facility]. Pt does have a neighborhood pool that he can use this summer.   He had difficulty with showering, bending, lifting all cause pain. Pt denies NT. Pt currently only able to walk for about 4 mins before having pain.     PERTINENT HISTORY:  Kyphoplasty, R THA, L THA, bilat TSA, L wrist ORIF (no precautions)   PAIN:  Are you having pain? Yes: NPRS scale: 1/10 Pain location: L/S on R  Pain description: shooting, aching, sharp pain Aggravating factors: standing in the shower, walking Relieving factors: tylenol    PRECAUTIONS: None  WEIGHT BEARING  RESTRICTIONS No  FALLS:  Has patient fallen in last 6 months? yes  LIVING ENVIRONMENT: Lives with: lives with their family Lives in: House/apartment  OCCUPATION: Desk job  PLOF: Independent with basic ADLs  PATIENT GOALS : Pt would like to get back to exercise, lose weight, and get rid pain.    OBJECTIVE:   DIAGNOSTIC FINDINGS:  Unable to see formal report in Epic  PATIENT SURVEYS:  FOTO 34  D/C 59  9 pts MCII  SCREENING FOR RED FLAGS: Bowel or bladder incontinence: No Spinal tumors: No Cauda equina syndrome: No Compression fracture: No   COGNITION:  Overall cognitive status: Within functional limits for tasks assessed     SENSATION: WFL  POSTURE:   Decreased lumbar lordosis, ant weight shift  PALPATION: TTP and hyper tonicity of of posterior hip and L/S paraspinals; diminished muscle mass of R gluteals Lumbar joint stiffness with bilat UPA, painful on R  L/S ROM:   Active  A/PROM  03/28/2022  Flexion 50% p!   Extension 20% p!  Right lateral flexion 75%  Left lateral flexion 75%  Right rotation 50%  Left rotation 50%   (Blank rows = not tested)  LE ROM:  moderately limited bilaterally due to pain, stiffness, and body habitus  LE MMT:  MMT Right 03/28/2022 Left 03/28/2022  Hip flexion 4+/5 4/5  Hip extension 4+/5 4+/5  Hip abduction 4+/5 4+/5  Hip adduction 4+/5 4+/5   (Blank rows = not tested)  LUMBAR SPECIAL TESTS:  Slump test: Positive, Single leg stance test: Positive, and FABER test: Positive  FUNCTIONAL TESTS:  5 times sit to stand: 16.3s Stairs: reciprocal pattern but with heavy UE support  and leaning on railing, decrease eccentric lower, trunk and hip rotation compensation when descending with L  GAIT: Distance walked: 73ft Assistive device utilized: None Level of assistance: Complete Independence Comments: L trunk lean  TODAY'S TREATMENT   Exercises - Supine Lower Trunk Rotation  - 2 x daily - 7 x weekly - 2 sets - 10 reps -  2 hold - Supine Posterior Pelvic Tilt  - 2 x daily - 7 x weekly - 2 sets - 10 reps - 2 hold - Hooklying Single Knee to Chest Stretch  - 2 x daily - 7 x weekly - 2 sets - 10 reps - 5 hold - Sit to Stand with Arms Crossed  - 1 x daily - 7 x weekly - 1 sets - 5 reps  PATIENT EDUCATION:  Education details: MOI, diagnosis, prognosis, anatomy, exercise progression, DOMS expectations, muscle firing,  envelope of function, HEP, POC  Person educated: Patient Education method: Explanation, Demonstration, Tactile cues, Verbal cues, and Handouts Education comprehension: verbalized understanding, returned demonstration, verbal cues required, and tactile cues required   HOME EXERCISE PROGRAM:  Access Code: [email or web address] URL: [email or web address] Date: 01/08/2022 Prepared by: Zebedee Iba  ASSESSMENT:  CLINICAL IMPRESSION: Patient is a 60 y.o. male who was seen today for physical therapy evaluation and treatment for cc of L/S pain. Pt's pain continues to be irritable with movement, especially transfers and with stairs/hills. Pt previous episode of care interrupted by L wrist fx. No known precautions. Pt's L/S pain appears consistent with mechanical pain due to lumbar joint stiffness, hip stiffness, and LE weakness with functional mobility. Plan to start aquatic therapy in order to facilitate improvement to standing and CKC exercise. Pt would benefit from continued skilled therapy in order to reach goals and maximize functional lumbar and hip strength and ROM for prevention of further functional decline.     OBJECTIVE IMPAIRMENTS Abnormal gait, decreased activity tolerance, decreased endurance, decreased mobility, difficulty walking, decreased ROM, decreased strength, hypomobility, increased fascial restrictions, increased muscle spasms, impaired flexibility, impaired sensation, improper body mechanics, postural dysfunction, and pain.   ACTIVITY LIMITATIONS cleaning, community activity, driving,  occupation, yard work, shopping, and exercise .   PERSONAL FACTORS Age, Behavior pattern, Fitness, Past/current experiences, Time since onset of injury/illness/exacerbation, and 1-2 comorbidities:    are also affecting patient's functional outcome.    REHAB POTENTIAL: Fair    CLINICAL DECISION MAKING: Evolving/moderate complexity  EVALUATION COMPLEXITY: Moderate   GOALS:   SHORT TERM GOALS: Target date: 05/09/2022   Pt will become independent with HEP in order to demonstrate synthesis of PT education.   Goal status: INITIAL  2.  Pt will score at least 9 pt increase on FOTO to demonstrate functional improvement in MCII and pt perceived function.    Goal status: INITIAL  3.  Pt will be able to demonstrate STS without UE support  in order to demonstrate functional improvement in LE function for self-care and house hold duties.   Goal status: INITIAL  LONG TERM GOALS: Target date: 06/20/2022   Pt  will become independent with final HEP in order to demonstrate synthesis of PT education.   Goal status: INITIAL  2.  Pt will score >/= 47 on FOTO to demonstrate improvement in perceived R hip and L/S function.   Goal status: INITIAL  3.  Pt will be able to perform 5XSTS in under 12s  in order to demonstrate functional improvement above the cut off score for adults.    Goal status: INITIAL  4.  Pt will be able to demonstrate/report ability to walk >15 mins with or without pain in order to demonstrate functional improvement and tolerance to exercise and community mobility.   Goal status: INITIAL  5. Pt will be able to demonstrate ability to swing a golf club 75% without pain in order to demonstrate functional improvement in lumbar pain for return to exercise and recreation.    Goal status: INITIAL   PLAN: PT FREQUENCY: 1-2x/week  PT DURATION: 12 weeks (likely D/C by 10wks)  PLANNED INTERVENTIONS: Therapeutic exercises, Therapeutic activity, Neuromuscular  re-education, Balance training, Gait training, Patient/Family education, Joint manipulation, Joint mobilization, Stair training, Orthotic/Fit training, DME instructions, Aquatic Therapy, Dry Needling, Electrical stimulation, Spinal manipulation, Spinal mobilization, Cryotherapy, Moist heat, scar mobilization, Taping, Vasopneumatic device, Traction, Ultrasound, Ionotophoresis 4mg /ml Dexamethasone, and Manual therapy.  PLAN FOR NEXT SESSION: intro to aquatics-  L/S stretching, bilat hip stretching, lumbopelvic strength, endurance, develop aquatic HEP for home pool   Daleen Bo, PT 03/28/2022, 12:04 PM

## 2022-03-28 NOTE — Procedures (Signed)
   Patient Name: John [name], John [name] Date: 03/27/2022 Gender: Male D.O.B: 03-19-1961 Age (years): 65 Referring Provider: Armanda Magic MD, ABSM Height (inches): 71 Interpreting Physician: Armanda Magic MD, ABSM Weight (lbs): 271 RPSGT: Ulyess Mort BMI: 38 MRN: [id number] Neck Size: 19.00  CLINICAL INFORMATION The patient is referred for a BiPAP titration to treat sleep apnea.  SLEEP STUDY TECHNIQUE As per the AASM Manual for the Scoring of Sleep and Associated Events v2.3 (April 2016) with a hypopnea requiring 4% desaturations.  The channels recorded and monitored were frontal, central and occipital EEG, electrooculogram (EOG), submentalis EMG (chin), nasal and oral airflow, thoracic and abdominal wall motion, anterior tibialis EMG, snore microphone, electrocardiogram, and pulse oximetry. Bilevel positive airway pressure (BPAP) was initiated at the beginning of the study and titrated to treat sleep-disordered breathing.  MEDICATIONS Medications self-administered by patient taken the night of the study : TRAZODONE  RESPIRATORY PARAMETERS Optimal IPAP Pressure (cm): 19  AHI at Optimal Pressure (/hr) 0 Optimal EPAP Pressure (cm):15  Overall Minimal O2 (%):81.0  Minimal O2 at Optimal Pressure (%): 91.0  SLEEP ARCHITECTURE Start Time:10:56:10 PM  Stop Time:5:19:50 AM  Total Time (min):383.7  Total Sleep Time (min):264.5 Sleep Latency (min):81.4  Sleep Efficiency (%):68.9%  REM Latency (min):130.5  WASO (min):37.8 Stage N1 (%):16.3%  Stage N2 (%):42.3%  Stage N3 (%):0.0%  Stage R (%):41.4 Supine (%):17.01  Arousal Index (/hr):25.4   CARDIAC DATA The 2 lead EKG demonstrated sinus rhythm. The mean heart rate was 55.0 beats per minute. Other EKG findings include: PVCs, bigeminal PVCs.  LEG MOVEMENT DATA The total Periodic Limb Movements of Sleep (PLMS) were 0. The PLMS index was 0.0. A PLMS index of <15 is considered normal in adults.  IMPRESSIONS - An optimal PAP  pressure was selected for this patient ( 19/15 cm of water) - Central sleep apnea was not noted during this titration (CAI = 1.4/h). - Moderete oxygen desaturations were observed during this titration (min O2 = 81.0%). - The patient snored with moderate snoring volume. - 2-lead EKG demonstrated: PVCs and bigeminal PVCs. - Clinically significant periodic limb movements were not noted during this study. Arousals associated with PLMs were rare.  DIAGNOSIS - Obstructive Sleep Apnea (G47.33)  RECOMMENDATIONS - Trial of BiPAP therapy on 19/15 cm H2O with a Large size Fisher[employer]Paykel Full Face Evora Full mask and heated humidification. - Avoid alcohol, sedatives and other CNS depressants that may worsen sleep apnea and disrupt normal sleep architecture. - Sleep hygiene should be reviewed to assess factors that may improve sleep quality. - Weight management and regular exercise should be initiated or continued. - Return to Sleep Center for re-evaluation after 6 weeks of therapy  [Electronically signed] 03/28/2022 01:35 PM  Armanda Magic MD, ABSM Diplomate, American Board of Sleep Medicine

## 2022-04-04 ENCOUNTER — Ambulatory Visit (HOSPITAL_BASED_OUTPATIENT_CLINIC_OR_DEPARTMENT_OTHER): Payer: Commercial Managed Care - HMO | Admitting: Physical Therapy

## 2022-04-04 NOTE — Telephone Encounter (Addendum)
-----   Message -----  From: Quintella Reichert, MD  Sent: 03/28/2022   1:37 PM EDT  To: Cv Div Sleep Studies   Please let patient know that they had a successful PAP titration and let DME know that orders are in EPIC.  Please set up 6 week OV with me.      The patient has been notified of the result and verbalized understanding.  All questions (if any) were answered. Latrelle Dodrill, CMA 04/04/2022 2:21 PM    Upon patient request DME selection is [facility] Patient understands he will be contacted by [facility] to set up his cpap. Patient understands to call if [facility] does not contact him with new setup in a timely manner. Patient understands they will be called once confirmation has been received from [facility] that they have received their new machine to schedule 10 week follow up appointment.   [facility] notified of new cpap order  Please add to airview Patient was grateful for the call and thanked me.

## 2022-04-07 ENCOUNTER — Other Ambulatory Visit: Payer: Self-pay | Admitting: Family Medicine

## 2022-04-15 IMAGING — DX DG CHEST 2V
2 series · 2 of 2 positions shown · non-contrast
Comparison: Radiograph 10/15/2019

CLINICAL DATA: Cough and shortness of breath for 1 week.

EXAM:
CHEST - 2 VIEW

[chest pa]
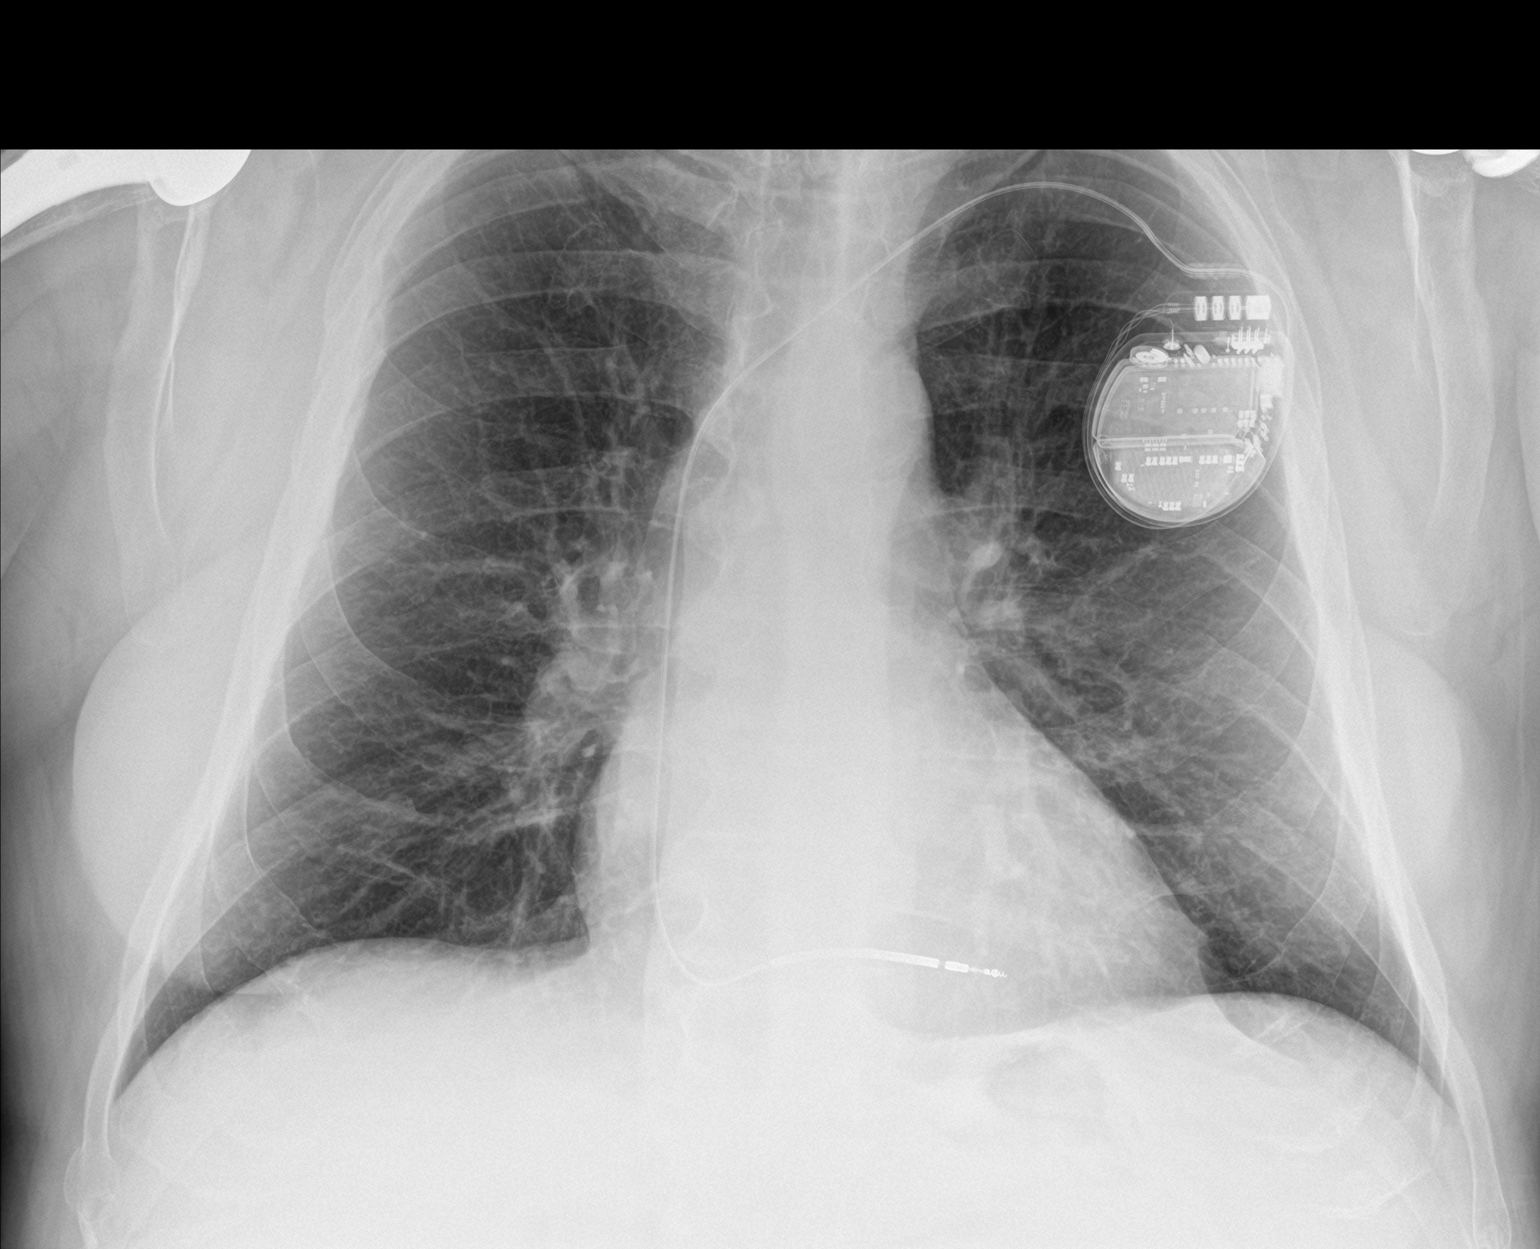

[chest lat]
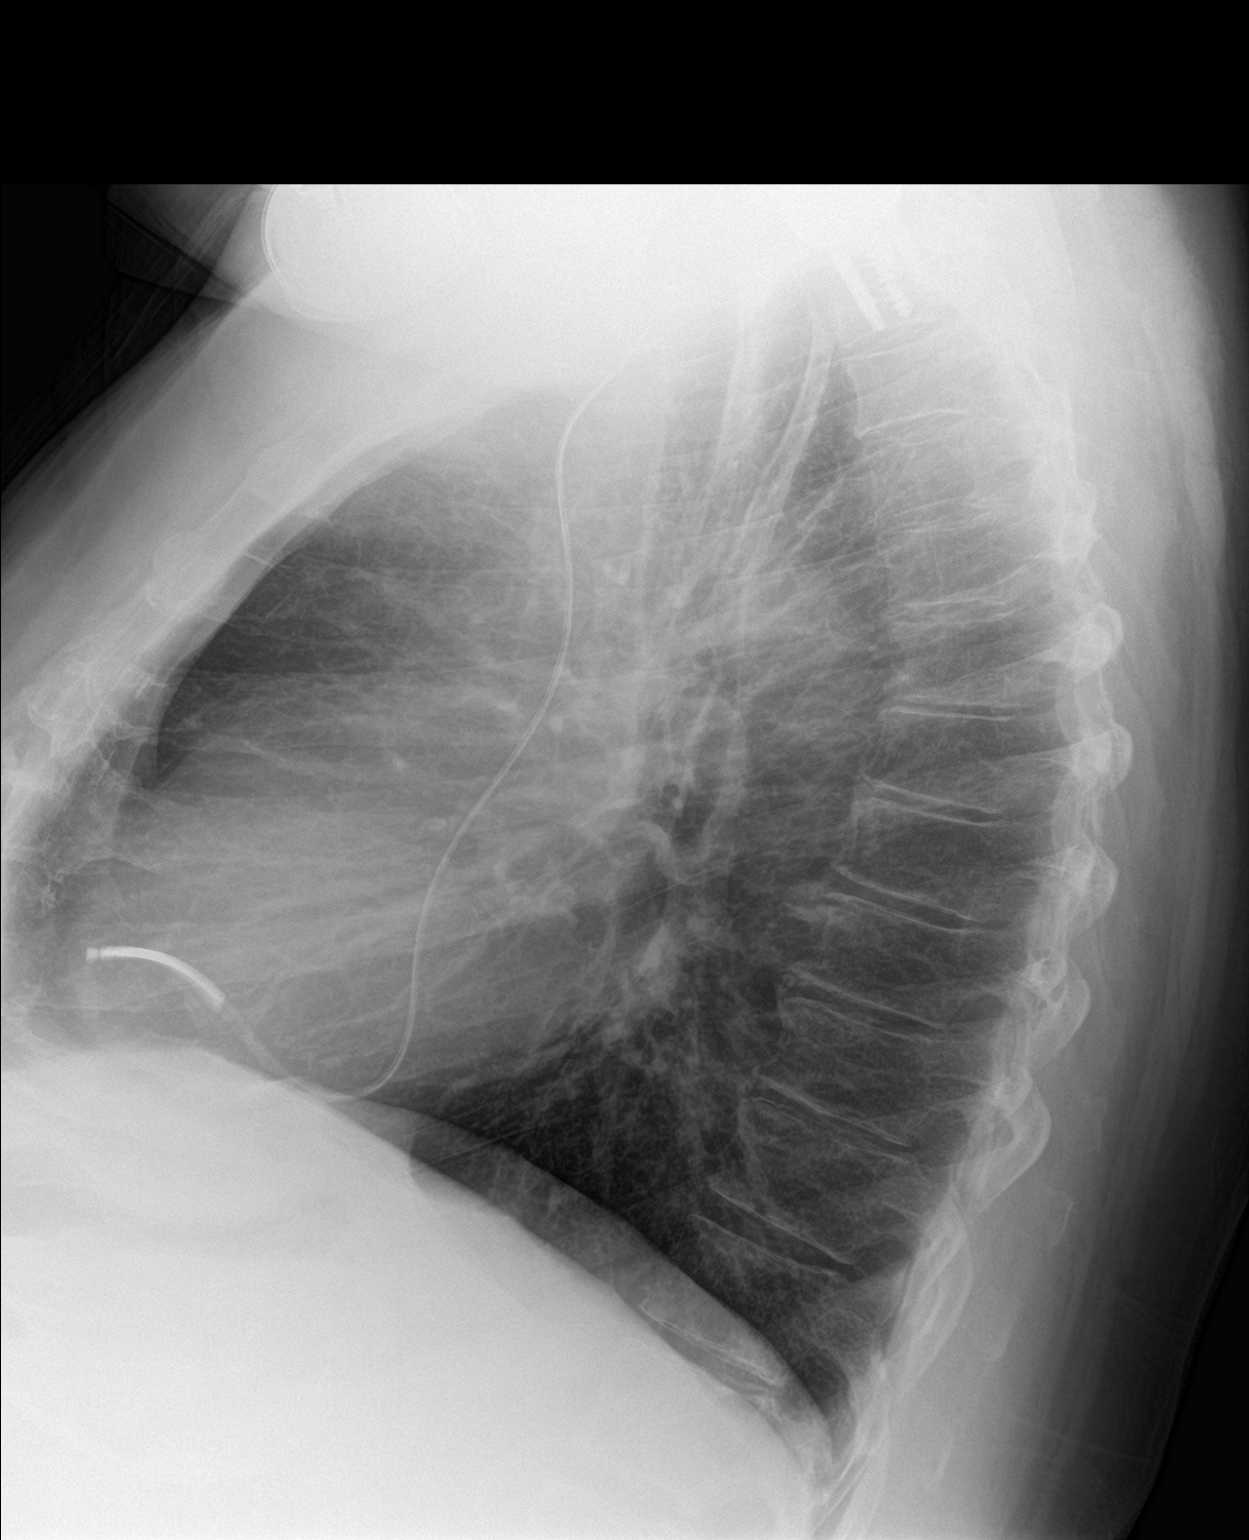

[2 of 2 positions shown; findings below may reference images not displayed]

FINDINGS: Single lead left-sided pacemaker in place. Upper normal heart size
with unchanged mediastinal contours. There is no pulmonary edema. No
focal airspace disease. No pleural fluid or pneumothorax. Bilateral
shoulder arthroplasties. No acute osseous abnormalities are seen.
Remote right rib fractures.
IMPRESSION: No acute chest findings or explanation for cough and shortness of
breath.

## 2022-04-16 ENCOUNTER — Ambulatory Visit (HOSPITAL_BASED_OUTPATIENT_CLINIC_OR_DEPARTMENT_OTHER): Payer: Self-pay | Admitting: Physical Therapy

## 2022-04-16 ENCOUNTER — Encounter: Payer: Self-pay | Admitting: *Deleted

## 2022-04-16 NOTE — Telephone Encounter (Signed)
This encounter was created in error - please disregard.

## 2022-04-16 NOTE — Telephone Encounter (Signed)
-----   Message from Gaynelle Cage, CMA sent at 04/16/2022  8:44 AM EDT -----  ----- Message ----- From: Quintella Reichert, MD Sent: 03/28/2022   1:37 PM EDT To: Cv Div Sleep Studies  Please let patient know that they had a successful PAP titration and let DME know that orders are in EPIC.  Please set up 6 week OV with me.

## 2022-04-19 ENCOUNTER — Ambulatory Visit (HOSPITAL_BASED_OUTPATIENT_CLINIC_OR_DEPARTMENT_OTHER): Payer: Self-pay | Admitting: Physical Therapy

## 2022-04-22 ENCOUNTER — Encounter (HOSPITAL_BASED_OUTPATIENT_CLINIC_OR_DEPARTMENT_OTHER): Payer: Self-pay | Admitting: Physical Therapy

## 2022-04-22 ENCOUNTER — Ambulatory Visit (HOSPITAL_BASED_OUTPATIENT_CLINIC_OR_DEPARTMENT_OTHER): Payer: Commercial Managed Care - HMO | Attending: Orthopedic Surgery | Admitting: Physical Therapy

## 2022-04-22 DIAGNOSIS — R262 Difficulty in walking, not elsewhere classified: Secondary | ICD-10-CM | POA: Insufficient documentation

## 2022-04-22 DIAGNOSIS — M6281 Muscle weakness (generalized): Secondary | ICD-10-CM | POA: Insufficient documentation

## 2022-04-22 DIAGNOSIS — M5459 Other low back pain: Secondary | ICD-10-CM | POA: Insufficient documentation

## 2022-04-24 ENCOUNTER — Encounter (HOSPITAL_COMMUNITY): Payer: Self-pay

## 2022-04-24 ENCOUNTER — Encounter (HOSPITAL_COMMUNITY): Payer: Commercial Managed Care - HMO

## 2022-04-24 ENCOUNTER — Ambulatory Visit (HOSPITAL_COMMUNITY)
Admission: RE | Admit: 2022-04-24 | Discharge: 2022-04-24 | Disposition: A | Discharge status: Home / Self Care | Payer: Commercial Managed Care - HMO | Source: Ambulatory Visit | Attending: Adult Health | Admitting: Adult Health

## 2022-04-24 VITALS — BP 122/70 | HR 67 | Wt 270.2 lb

## 2022-04-24 DIAGNOSIS — Z7984 Long term (current) use of oral hypoglycemic drugs: Secondary | ICD-10-CM | POA: Diagnosis not present

## 2022-04-24 DIAGNOSIS — I5042 Chronic combined systolic (congestive) and diastolic (congestive) heart failure: Secondary | ICD-10-CM

## 2022-04-24 DIAGNOSIS — I428 Other cardiomyopathies: Secondary | ICD-10-CM | POA: Diagnosis not present

## 2022-04-24 DIAGNOSIS — F419 Anxiety disorder, unspecified: Secondary | ICD-10-CM | POA: Insufficient documentation

## 2022-04-24 DIAGNOSIS — M545 Low back pain, unspecified: Secondary | ICD-10-CM | POA: Diagnosis not present

## 2022-04-24 DIAGNOSIS — F1010 Alcohol abuse, uncomplicated: Secondary | ICD-10-CM

## 2022-04-24 DIAGNOSIS — G4733 Obstructive sleep apnea (adult) (pediatric): Secondary | ICD-10-CM | POA: Insufficient documentation

## 2022-04-24 DIAGNOSIS — Z79899 Other long term (current) drug therapy: Secondary | ICD-10-CM | POA: Diagnosis not present

## 2022-04-24 DIAGNOSIS — F32A Depression, unspecified: Secondary | ICD-10-CM | POA: Diagnosis not present

## 2022-04-24 DIAGNOSIS — I5082 Biventricular heart failure: Secondary | ICD-10-CM | POA: Insufficient documentation

## 2022-04-24 DIAGNOSIS — Z7901 Long term (current) use of anticoagulants: Secondary | ICD-10-CM | POA: Insufficient documentation

## 2022-04-24 DIAGNOSIS — I480 Paroxysmal atrial fibrillation: Secondary | ICD-10-CM

## 2022-04-24 LAB — BASIC METABOLIC PANEL
Anion gap: 11 (ref 5–15)
BUN: 19 mg/dL (ref 6–20)
CO2: 22 mmol/L (ref 22–32)
Calcium: 9.1 mg/dL (ref 8.9–10.3)
Chloride: 98 mmol/L (ref 98–111)
Creatinine, Ser: 0.98 mg/dL (ref 0.61–1.24)
GFR, Estimated: >60 mL/min (ref >60)
Glucose, Bld: 131 mg/dL — ABNORMAL HIGH (ref 70–99)
Potassium: 3.8 mmol/L (ref 3.5–5.1)
Sodium: 131 mmol/L — ABNORMAL LOW (ref 135–145)

## 2022-04-24 NOTE — Patient Instructions (Signed)
Labs done today, your results will be available in MyChart, we will contact you for abnormal readings.  Your physician recommends that you schedule a follow-up appointment in: 3 months  Do the following things EVERYDAY: Weigh yourself in the morning before breakfast. Write it down and keep it in a log. Take your medicines as prescribed Eat low salt foods--Limit salt (sodium) to 2000 mg per day.  Stay as active as you can everyday Limit all fluids for the day to less than 2 liters  If you have any questions or concerns before your next appointment please send [place] a message through [place] or call our office at [phone].    TO LEAVE A MESSAGE FOR THE NURSE SELECT OPTION 2, PLEASE LEAVE A MESSAGE INCLUDING: YOUR NAME DATE OF BIRTH CALL BACK NUMBER REASON FOR CALL**this is important as we prioritize the call backs  YOU WILL RECEIVE A CALL BACK THE SAME DAY AS LONG AS YOU CALL BEFORE 4:00 PM   At the [facility], you and your health needs are our priority. As part of our continuing mission to provide you with exceptional heart care, we have created designated Provider Care Teams. These Care Teams include your primary Cardiologist (physician) and Advanced Practice Providers (APPs- Physician Assistants and Nurse Practitioners) who all work together to provide you with the care you need, when you need it.   You may see any of the following providers on your designated Care Team at your next follow up: Dr Arvilla Meres Dr Carron Curie, NP Robbie Lis, [place] [facility] [place], [place] Karle Plumber, PharmD   Please be sure to bring in all your medications bottles to every appointment.

## 2022-04-24 NOTE — Progress Notes (Signed)
PCP: Dr. Nolen Mu Cardiology: Dr. Allyson Sabal HF Cardiology: Dr. Shirlee Latch  61 y.o. with history of nonischemic cardiomyopathy and OSA was referred by Dr. Allyson Sabal for evaluation of CHF.  He was initially diagnosed in early 2/18.  He had URI symptoms then developed dyspnea and put on weight. Echo was done in 2/18, showing EF 20-25%.  LHC in 2/18 showed normal coronaries. He was started on treatment for cardiomyopathy, and EF rose to 40% by 6/18. However, echo in 6/19 showed EF back down to 15-20% with severe RV dilation/dysfunction.  CPX was done in 7/19, showed moderate functional limitation though submaximal.  He had St Jude ICD placed in 8/19.   Atrial fibrillation noted in 12/20, he had TEE-guided DCCV in 12/20.  He remains in NSR today and has not felt palpitations.  EF on TEE was 25-30%.   He has been a heavy drinker but has cut back.  Still drinking on occasion, however.   He had left shoulder replacement in 2/21.  He developed a surgical site infection that is now resolved.    Echo in 4/22 showed EF 25-30%, mild LV dilation, normal RV.   He had THR in 8/22.   VT in 4/23, terminated by [facility].   Echo 01/2022  EF 30-35%, global HK, mild LVH, normal RV.   Today he returns for 3 month HF follow up.He had a fall going to physical therapy. Says he didn't have breakfast and thinks contributed.  Overall feeling fine. Having ongoing back pain. Denies SOB/PND/Orthopnea. Appetite ok. No fever or chills. Drinking 2 beers a day. Weight at home 265 pounds. Taking all medications.  St Jude device interrogation:  Impedance stable. No VT/VF   Labs (7/19): K 4.9, creatinine 0.91 Labs (8/19): Na 128, K 4.9, creatinine 0.72 Labs (9/19): LDL 45 Labs (12/19): K 3.9, creatinine 0.94  Labs (1/20): K 4.5, creatinine 0.94, hgb 13.2 Labs (10/20): K 4.4, creatinine 0.99 Labs (12/20): K 4.6, creatinine 1.0 Labs (2/21): K 3.9, creatinine 0.86 Labs (4/21): K 4.9, creatinine 1.05 Labs (6/21): K 4, creatinine 0.94 Labs  (12/21): K 4.6, creatinine 1.0, LFTs normal, hgb 14.1, TGs 163, LDL 71 Labs (2/22): K 3.43m, creatinine 0.91, BNP 1130 Labs (8/22): K 4.8, creatinine 1.36 Labs (4/23): BNP 254, K 4.5, creatinine 1.13 Labs (02/04/2022): Creatinine 1.38   PMH: 1. OSA: uses CPAP.  2. Depression 3. Chronic systolic CHF: Nonischemic cardiomyopathy.  Diagnosed around 2/18.  - LHC (2/18): Normal coronaries.  - Echo (2/18): EF 20-25% - Echo (6/18): EF 40% - Echo (12/18): EF 35-40% - Echo (6/19): LV mildly dilated, EF 15-20%, RV severely dilated with severely decreased systolic function.  - CPX (7/19): peak VO2 16, VE/VCO2 slope 37, RER 1.03 => submaximal but probably moderate functional limitation.  - Cardiac MRI (7/19): EF 29% with mild LV dilation, mildly decreased RV systolic function EF 40%, mid-wall LGE throughtout the septal wall and the inferor wall.  - St Jude ICD placed in 8/19.  - Echo (12/19): EF 25-30%, diffuse hypokinesis, normal RV size and systolic function.  - Echo (12/20): EF 25-30%, moderate LVH, normal RV size and systolic function.  - TEE (12/20): EF 25-30%, mildly decreased RV systolic function.  - Echo (4/22): EF 25-30%, mild LV dilation, normal RV. - Echo (5/23): EF 30-35%, global HK, mild LVH, normal RV. 4. ETOH abuse.  5. Atrial fibrillation: First noted in 12/20.  - DCCV to NSR in 12/20.  6. Left shoulder OA 7. THR 8/22  SH: Originally from [place], divorced  with 3 kids, works in sales/logistics, never smoked, h/o ETOH abuse. No drugs. Lives in [place].   FH: Sister with congenital heart abnormality.  No other cardiac disease.   ROS: All systems reviewed and negative except as per HPI.  Current Outpatient Medications  Medication Sig Dispense Refill   acetaminophen (TYLENOL) 325 MG tablet Take 1-2 tablets (325-650 mg total) by mouth every 6 (six) hours as needed for mild pain (pain score 1-3 or temp > 100.5).     carvedilol (COREG) 25 MG tablet Take 25 mg by mouth 2 (two) times  daily.     diphenhydramine-acetaminophen (TYLENOL PM) 25-500 MG TABS tablet Take 2 tablets by mouth at bedtime.     docusate sodium (COLACE) 100 MG capsule Take 1 capsule (100 mg total) by mouth 2 (two) times daily. 10 capsule 0   ELIQUIS 5 MG TABS tablet TAKE 1 TABLET BY MOUTH TWICE A DAY 180 tablet 3   ENTRESTO 97-103 MG TAKE 1 TABLET BY MOUTH TWICE A DAY 60 tablet 5   escitalopram (LEXAPRO) 10 MG tablet TAKE 1 TABLET BY MOUTH EVERY DAY 90 tablet 2   FARXIGA 10 MG TABS tablet TAKE 1 TABLET BY MOUTH EVERY DAY BEFORE BREAKFAST 90 tablet 3   hydrOXYzine (ATARAX) 25 MG tablet Take 25 mg by mouth as needed.     rosuvastatin (CRESTOR) 20 MG tablet TAKE 1 TABLET BY MOUTH EVERY DAY 90 tablet 3   sertraline (ZOLOFT) 100 MG tablet Take 100 mg by mouth daily.     sildenafil (VIAGRA) 100 MG tablet TAKE 0.5 TABLETS (50 MG TOTAL) BY MOUTH AT BEDTIME AS NEEDED FOR ERECTILE DYSFUNCTION. 10 tablet 3   spironolactone (ALDACTONE) 50 MG tablet Take 1 tablet (50 mg total) by mouth daily. 90 tablet 3   torsemide (DEMADEX) 20 MG tablet TAKE 3 TABLETS BY MOUTH EVERY DAY 270 tablet 1   traZODone (DESYREL) 50 MG tablet TAKE 1 TABLET BY MOUTH EVERY DAY AT BEDTIME AS NEEDED 30 tablet 1   fluticasone (FLONASE) 50 MCG/ACT nasal spray Place 1 spray into both nostrils daily. Use twice a day until symptoms under control, then reduce to daily use. (Patient not taking: Reported on 04/24/2022) 16 g 2   No current facility-administered medications for this encounter.   Wt Readings from Last 3 Encounters:  04/24/22 122.6 kg (270 lb 3.2 oz)  03/27/22 122.9 kg (271 lb)  02/13/22 124.7 kg (275 lb)    BP 122/70   Pulse 67   Wt 122.6 kg (270 lb 3.2 oz)   SpO2 97%   BMI 37.69 kg/m  General:  Well appearing. No resp difficulty. Walked in the clinic.  HEENT: normal Neck: supple. no JVD. Carotids 2+ bilat; no bruits. No lymphadenopathy or thryomegaly appreciated. Cor: PMI nondisplaced. Regular rate & rhythm. No rubs, gallops or  murmurs. Lungs: clear Abdomen: soft, nontender, nondistended. No hepatosplenomegaly. No bruits or masses. Good bowel sounds. Extremities: no cyanosis, clubbing, rash, edema Neuro: alert & orientedx3, cranial nerves grossly intact. moves all 4 extremities w/o difficulty. Affect pleasant g or cyanosis.  HEENT: Normal.   Assessment/Plan: 1. Chronic systolic CHF: Nonischemic cardiomyopathy with biventricular failure.  Cath in 2/18 with no coronary disease.  Echo (6/19) with EF 15-20%, severe RV dilation/dysfunction.  CPX 7/19 submaximal but probably moderate functional limitation.  Cardiac MRI in 7/19 showed EF 29%.  LGE pattern was concerning for prior viral myocarditis. Echo 01/2022 showed EF 30-35%, global HK, mild LVH, normal RV.  Has St Jude ICD.  Cause of CMP suspected to be viral myocarditis versus ETOH.  No family history of cardiomyopathy.   - St Jude - not suggestive of fluid overload.  -NYHA II. Volume status stable.  - On GDMT.  - Continue torsemide 60 mg daily.  - Continue dapagliflozin 10 mg daily.  - Continue spironolactone to 50 mg daily  - Continue Coreg 25 mg bid. - Continue Entresto 97/103 bid.  - He needs to stop ETOH => suspect this is either the cause or a contributor to his cardiomyopathy.    - Check BMET today  - CPX in future, cannot do yet given significant low back pain.   - next visit consider Impulse CCM. Has narrow QRS.  2. OSA: Just starting using CPAP.   3. Anxiety/depression: Seeing a psychiatrist.  4. ETOH Abuse: This is likely causing/contributing to his cardiomyopathy and likely played a role in triggering atrial fibrillation.   - Continue to drink a few beers a day.  - Discussed cessation.   5. Atrial fibrillation: Paroxysmal, remains in NSR after DCCV in 12/20.  - Continue Eliquis 5 mg bid.   - Cut back on ETOH. Discussed cessation.   - If atrial fibrillation returns, favor ablation.  6. VT: Episode of VT in 4/23 terminated by ATP.  No VT since then.    - Continue Coreg.  Would not start amiodarone with single episode terminated by [facility].   Follow up in 3 months.   Jakaleb Payer NP-C  04/24/2022

## 2022-04-25 ENCOUNTER — Encounter (HOSPITAL_BASED_OUTPATIENT_CLINIC_OR_DEPARTMENT_OTHER): Payer: Self-pay | Admitting: Physical Therapy

## 2022-04-25 ENCOUNTER — Ambulatory Visit (HOSPITAL_BASED_OUTPATIENT_CLINIC_OR_DEPARTMENT_OTHER): Payer: Commercial Managed Care - HMO | Admitting: Physical Therapy

## 2022-04-25 DIAGNOSIS — R262 Difficulty in walking, not elsewhere classified: Secondary | ICD-10-CM

## 2022-04-25 DIAGNOSIS — M6281 Muscle weakness (generalized): Secondary | ICD-10-CM | POA: Diagnosis present

## 2022-04-25 DIAGNOSIS — M5459 Other low back pain: Secondary | ICD-10-CM | POA: Diagnosis present

## 2022-04-25 NOTE — Therapy (Signed)
OUTPATIENT PHYSICAL THERAPY TREATMENT NOTE  Patient Name: John [name] MRN: [id number] DOB:1961-04-05, 61 y.o., male Today's Date: 04/25/2022   PT End of Session - 04/25/22 1148     Visit Number 3    Number of Visits 22    Date for PT Re-Evaluation 06/26/22    Authorization Type [employer]    PT Start Time 1150    PT Stop Time 1222    PT Time Calculation (min) 32 min    Activity Tolerance Patient tolerated treatment well    Behavior During Therapy WFL for tasks assessed/performed               Past Medical History:  Diagnosis Date   AICD (automatic cardioverter/defibrillator) present    St. Jude   Anxiety    Ativan   Chronic combined systolic and diastolic CHF, NYHA class 3 (HCC) 10/2016   Nonischemic cardiomyopathy. EF 20-25%.   Chronic left hip pain    Depression    history of   Dysrhythmia    A-FIB   GI bleed 03/2016   Headache    Hypertensive heart disease with combined systolic and diastolic congestive heart failure (HCC) 10/2016   Nonischemic cardiomyopathy (HCC) 10/2016   Echo with EF 20-25%. Cardiac catheterization with no CAD. LVEDP was 41 mmHg, PCWP 36 mmHg   Osteoarthritis    right shoulder   Right hand fracture    Seasonal allergies    Sleep apnea    wears a CPAP   Wears glasses    Past Surgical History:  Procedure Laterality Date   CARDIAC CATHETERIZATION     CARDIOVERSION N/A 09/01/2019   Procedure: CARDIOVERSION;  Surgeon: Laurey Morale, MD;  Location: [facility] ENDOSCOPY;  Service: Cardiovascular;  Laterality: N/A;   ESOPHAGOGASTRODUODENOSCOPY (EGD) WITH PROPOFOL N/A 03/20/2016   Procedure: ESOPHAGOGASTRODUODENOSCOPY (EGD) WITH PROPOFOL;  Surgeon: Charlott Rakes, MD;  Location: [facility] ENDOSCOPY;  Service: Endoscopy;  Laterality: N/A;   ICD IMPLANT N/A 05/06/2018   Procedure: ICD IMPLANT;  Surgeon: Thurmon Fair, MD;  Location: [facility] INVASIVE CV LAB;  Service: Cardiovascular;  Laterality: N/A;   IRRIGATION AND DEBRIDEMENT SHOULDER Left 11/11/2019    Procedure: LEFT SHOULDER IRRIGATION AND DEBRIDEMENT WITH POLY EXCHANGE;  Surgeon: Jones Broom, MD;  Location: [facility] ORS;  Service: Orthopedics;  Laterality: Left;   KYPHOPLASTY N/A 02/22/2021   Procedure: KYPHOPLASTY T12;  Surgeon: Venita Lick, MD;  Location: [facility] OR;  Service: Orthopedics;  Laterality: N/A;  90 mins   ORIF WRIST FRACTURE Left 02/04/2022   Procedure: OPEN REDUCTION INTERNAL FIXATION (ORIF) WRIST FRACTURE;  Surgeon: Bradly Bienenstock, MD;  Location: [facility] OR;  Service: Orthopedics;  Laterality: Left;  with IV sedation   RIGHT/LEFT HEART CATH AND CORONARY ANGIOGRAPHY N/A 10/21/2016   Procedure: Right/Left Heart Cath and Coronary Angiography;  Surgeon: Runell Gess, MD;  Location: [facility] INVASIVE CV LAB;  Service: Cardiovascular: Angiographically normal coronary arteries. PCWP 33-36 mmHg, LVEDP 41 mmHg. PA pressure 60/35, mean 46 mmHg.  Cardiac output/cardiac index-3.93 /1.76 Hiram Comber), 3.49/1.57 (thermodilution)   TEE WITHOUT CARDIOVERSION N/A 09/01/2019   Procedure: TRANSESOPHAGEAL ECHOCARDIOGRAM (TEE);  Surgeon: Laurey Morale, MD;  Location: [facility] ENDOSCOPY;  Service: Cardiovascular;  Laterality: N/A;   TOTAL HIP ARTHROPLASTY Left 03/27/2015   Procedure: LEFT TOTAL HIP ARTHROPLASTY ANTERIOR APPROACH;  Surgeon: Samson Frederic, MD;  Location: [facility] OR;  Service: Orthopedics;  Laterality: Left;   TOTAL HIP ARTHROPLASTY Right 05/03/2021   Procedure: TOTAL HIP ARTHROPLASTY ANTERIOR APPROACH;  Surgeon: Samson Frederic, MD;  Location: [facility] ORS;  Service:  Orthopedics;  Laterality: Right;   TOTAL SHOULDER ARTHROPLASTY Right 11/24/2015   Procedure: RIGHT TOTAL SHOULDER ARTHROPLASTY;  Surgeon: Beverely Low, MD;  Location: [facility] OR;  Service: Orthopedics;  Laterality: Right;   TOTAL SHOULDER ARTHROPLASTY Left 10/19/2019   Procedure: REVERSE TOTAL SHOULDER ARTHROPLASTY;  Surgeon: Jones Broom, MD;  Location: [facility] ORS;  Service: Orthopedics;  Laterality: Left;   TRANSTHORACIC ECHOCARDIOGRAM  10/2016   EF 20-25%.  Diffuse hypokinesis but akinesis of the entire inferoseptal wall and apical wall. Moderate biatrial enlargement. PA pressure estimated 64 mmHg.   WISDOM TOOTH EXTRACTION     Patient Active Problem List   Diagnosis Date Noted   Persistent cough 08/27/2021   Acute conjunctivitis of right eye 08/27/2021   Osteoarthritis of right hip 05/03/2021   Osteoarthritis of left hip 05/03/2021   Aortic atherosclerosis (HCC) 03/22/2021   Thoracic compression fracture (HCC) 02/22/2021   Frequent falls 02/16/2021   Hyponatremia 02/16/2021   Acute anemia 02/16/2021   Hypotension 02/16/2021   ETOH abuse 02/16/2021   GAD (generalized anxiety disorder) 05/31/2020   Paroxysmal atrial fibrillation (HCC) 04/14/2020   S/P ICD (internal cardiac defibrillator) procedure 04/14/2020   Status post reverse total shoulder replacement, left 11/11/2019   Hyperlipidemia 05/27/2018   Unspecified fracture of upper end of left humerus, initial encounter for closed fracture 09/03/2017   Essential hypertension 11/08/2016   Non-ischemic cardiomyopathy (HCC)    Chronic combined systolic and diastolic CHF, NYHA class 3 (HCC) 10/2016   GI bleed 03/19/2016   Alcohol dependence in remission (HCC) 03/19/2016   S/P shoulder replacement 11/24/2015   Allergic rhinitis 08/21/2015   ED (erectile dysfunction) 08/21/2015   Hyperglycemia 08/21/2015   Insomnia 08/21/2015   OSA (obstructive sleep apnea) 08/21/2015   Degenerative joint disease of left hip 03/27/2015    PCP: Shelva Majestic, MD  REFERRING PROVIDER: Shelva Majestic, MD  REFERRING DIAG: [phone] (ICD-10-CM) - Encounter for other specified surgical aftercare  THERAPY DIAG:  Other low back pain  Muscle weakness (generalized)  Difficulty walking  ONSET DATE: 2022  SUBJECTIVE:                                                                                                                                                                                            SUBJECTIVE STATEMENT:  8/10:  Pt states that his back has still been on and off painful. It hurts currently in the middle of his back. Unfortunately, he had a fall at the doctor's office but did not have any specific injuries. Pt reports he is tired and fatigued at end of session. "I haven't worked out in years."  EVAL Pt states that the hip feels much better at this time. His LBP continues to bother him. He had to cancel his last PT session(s) due to breaking the L wrist. Pt states he tripped in the parking lot and had a FOOSH type injury.  Pt was at home and tripped again and landed on the hand again. Pt had plates and screws into the L wrist.   His back pain is still bothering him with walking, bending, lifting, showering. Pt states the back pain is from the back and up. He does not have NT or radiating pain down the legs. He states that moving/walking makes it more painful. Pt is still sedentary for large parts of the day.  Walking causes pain immediately. Pt states the R hip doesn't bother him walking anymore. Pt will feeling of legs giving out occasionally but will not happen in both legs, feels more like pain that causes them to give out.    PERTINENT HISTORY:  Kyphoplasty, R THA, L THA, bilat TSA, L wrist ORIF (no precautions)   PAIN:  Are you having pain? Yes: NPRS scale: 4-5/10 Pain location: L/S on R  Pain description: shooting, aching, sharp pain Aggravating factors: standing in the shower, walking Relieving factors: tylenol    PRECAUTIONS: None  WEIGHT BEARING RESTRICTIONS No  FALLS:  Has patient fallen in last 6 months? yes  LIVING ENVIRONMENT: Lives with: lives with their family Lives in: House/apartment  OCCUPATION: Desk job  PLOF: Independent with basic ADLs  PATIENT GOALS : Pt would like to get back to exercise, lose weight, and get rid pain.    OBJECTIVE:   PATIENT SURVEYS:  FOTO 34  D/C 47  9 pts MCII   TODAY'S TREATMENT   8/10  Pt seen for  aquatic therapy today.  Treatment took place in water 3.25-4 ft in depth at the [facility] pool. Temp of water was 91.  Pt entered/exited the pool via stairs (step to) independently with bilat rail.   Warm up: 3x sidestepping, retro walking, fwd walking; standing 3 way flexion stretch 5s 10x with noodle   Standing trunk rotation with noodle 2x10 Standing minisquat 2x10 with noodle support  Standing SKTC with back against pool wall 10x Seated fig 4 stretch 30s 3x each Standing hip ABD 2x10  Seated flutter kicking flexion and ABD/ADD 2x10 Seated lumbar flexion stretch 5s 10x Deep water walking with yellow noodle support 4x laps     Pt requires buoyancy for support and to offload joints with strengthening exercises. Viscosity of the water is needed for resistance of strengthening; water current perturbations provides challenge to standing balance unsupported, requiring increased core activation.    Previous: Exercises - Supine Lower Trunk Rotation  - 2 x daily - 7 x weekly - 2 sets - 10 reps - 2 hold - Supine Posterior Pelvic Tilt  - 2 x daily - 7 x weekly - 2 sets - 10 reps - 2 hold - Hooklying Single Knee to Chest Stretch  - 2 x daily - 7 x weekly - 2 sets - 10 reps - 5 hold - Sit to Stand with Arms Crossed  - 1 x daily - 7 x weekly - 1 sets - 5 reps  PATIENT EDUCATION:  Education details: anatomy, exercise progression, DOMS expectations, muscle firing,  envelope of function, HEP, POC  Person educated: Patient Education method: Explanation, Demonstration, Tactile cues, Verbal cues, and Handouts Education comprehension: verbalized understanding, returned demonstration, verbal cues required, and tactile cues  required   HOME EXERCISE PROGRAM:  Access Code: [email or web address] URL: [email or web address] Date: 01/08/2022 Prepared by: Zebedee Iba  ASSESSMENT:  CLINICAL IMPRESSION: Pt able to perform introductory session of aquatic therapy today without increased pain.  Pt had report of improved pain at end of session. However, session duration limited by endurance. Plan to continue with aquatic therapy sessions to address functional endurance and LBP. Pt did appear to tolerate deeper water off-weight better than waist deep setting. As pt does have access to neighborhood pool, pt advised on appropriate exercise for home. Plan to give formal aquatic HEP at next visit. Pt would benefit from continued skilled therapy in order to reach goals and maximize functional lumbar and hip strength and ROM for prevention of further functional decline.     OBJECTIVE IMPAIRMENTS Abnormal gait, decreased activity tolerance, decreased endurance, decreased mobility, difficulty walking, decreased ROM, decreased strength, hypomobility, increased fascial restrictions, increased muscle spasms, impaired flexibility, impaired sensation, improper body mechanics, postural dysfunction, and pain.   ACTIVITY LIMITATIONS cleaning, community activity, driving, occupation, yard work, shopping, and exercise .   PERSONAL FACTORS Age, Behavior pattern, Fitness, Past/current experiences, Time since onset of injury/illness/exacerbation, and 1-2 comorbidities:    are also affecting patient's functional outcome.    REHAB POTENTIAL: Fair    CLINICAL DECISION MAKING: Evolving/moderate complexity  EVALUATION COMPLEXITY: Moderate   GOALS:   SHORT TERM GOALS: Target date: 05/09/2022   Pt will become independent with HEP in order to demonstrate synthesis of PT education.   Goal status: INITIAL  2.  Pt will score at least 9 pt increase on FOTO to demonstrate functional improvement in MCII and pt perceived function.    Goal status: INITIAL  3.  Pt will be able to demonstrate STS without UE support  in order to demonstrate functional improvement in LE function for self-care and house hold duties.   Goal status: INITIAL  LONG TERM GOALS: Target date: 06/20/2022   Pt  will become independent  with final HEP in order to demonstrate synthesis of PT education.   Goal status: INITIAL  2.  Pt will score >/= 47 on FOTO to demonstrate improvement in perceived R hip and L/S function.   Goal status: INITIAL  3.  Pt will be able to perform 5XSTS in under 12s  in order to demonstrate functional improvement above the cut off score for adults.    Goal status: INITIAL  4.  Pt will be able to demonstrate/report ability to walk >15 mins with or without pain in order to demonstrate functional improvement and tolerance to exercise and community mobility.   Goal status: INITIAL  5. Pt will be able to demonstrate ability to swing a golf club 75% without pain in order to demonstrate functional improvement in lumbar pain for return to exercise and recreation.    Goal status: INITIAL   PLAN: PT FREQUENCY: 1-2x/week  PT DURATION: 12 weeks (likely D/C by 10wks)  PLANNED INTERVENTIONS: Therapeutic exercises, Therapeutic activity, Neuromuscular re-education, Balance training, Gait training, Patient/Family education, Joint manipulation, Joint mobilization, Stair training, Orthotic/Fit training, DME instructions, Aquatic Therapy, Dry Needling, Electrical stimulation, Spinal manipulation, Spinal mobilization, Cryotherapy, Moist heat, scar mobilization, Taping, Vasopneumatic device, Traction, Ultrasound, Ionotophoresis 4mg /ml Dexamethasone, and Manual therapy.  PLAN FOR NEXT SESSION:  L/S stretching, bilat hip stretching, lumbopelvic strength, endurance, develop aquatic HEP for home pool   , PT 04/25/2022, 12:46 PM

## 2022-04-29 ENCOUNTER — Ambulatory Visit (HOSPITAL_BASED_OUTPATIENT_CLINIC_OR_DEPARTMENT_OTHER): Payer: Commercial Managed Care - HMO | Admitting: Physical Therapy

## 2022-04-30 ENCOUNTER — Ambulatory Visit (INDEPENDENT_AMBULATORY_CARE_PROVIDER_SITE_OTHER): Payer: Commercial Managed Care - HMO

## 2022-04-30 DIAGNOSIS — I428 Other cardiomyopathies: Secondary | ICD-10-CM | POA: Diagnosis not present

## 2022-04-30 LAB — CUP PACEART REMOTE DEVICE CHECK
Battery Remaining Longevity: 68 mo
Battery Remaining Percentage: 66 %
Battery Voltage: 2.98 V
Brady Statistic RV Percent Paced: 1 %
Date Time Interrogation Session: 20230815020026
HighPow Impedance: 86 Ohm
HighPow Impedance: 86 Ohm
Lead Channel Impedance Value: 490 Ohm
Lead Channel Pacing Threshold Amplitude: 0.75 V
Lead Channel Pacing Threshold Pulse Width: 0.5 ms
Lead Channel Sensing Intrinsic Amplitude: 11.8 mV
Lead Channel Setting Pacing Amplitude: 2.5 V
Lead Channel Setting Pacing Pulse Width: 0.5 ms
Lead Channel Setting Sensing Sensitivity: 0.5 mV
Pulse Gen Serial Number: 9824800

## 2022-05-02 ENCOUNTER — Ambulatory Visit (HOSPITAL_BASED_OUTPATIENT_CLINIC_OR_DEPARTMENT_OTHER): Payer: Commercial Managed Care - HMO | Admitting: Physical Therapy

## 2022-05-06 ENCOUNTER — Encounter (HOSPITAL_BASED_OUTPATIENT_CLINIC_OR_DEPARTMENT_OTHER): Payer: Self-pay | Admitting: Physical Therapy

## 2022-05-06 ENCOUNTER — Ambulatory Visit (HOSPITAL_BASED_OUTPATIENT_CLINIC_OR_DEPARTMENT_OTHER): Payer: Commercial Managed Care - HMO | Admitting: Physical Therapy

## 2022-05-06 DIAGNOSIS — M5459 Other low back pain: Secondary | ICD-10-CM

## 2022-05-06 DIAGNOSIS — M6281 Muscle weakness (generalized): Secondary | ICD-10-CM

## 2022-05-06 DIAGNOSIS — R262 Difficulty in walking, not elsewhere classified: Secondary | ICD-10-CM

## 2022-05-06 NOTE — Therapy (Signed)
OUTPATIENT PHYSICAL THERAPY TREATMENT NOTE  Patient Name: John [name] MRN: [id number] DOB:[birth year], 61 y.o., male Today's Date: 05/06/2022   PT End of Session - 05/06/22 1517     Visit Number 4    Number of Visits 22    Date for PT Re-Evaluation 06/26/22    Authorization Type [employer]    PT Start Time 1517    PT Stop Time 1555    PT Time Calculation (min) 38 min    Activity Tolerance Patient tolerated treatment well    Behavior During Therapy WFL for tasks assessed/performed               Past Medical History:  Diagnosis Date   AICD (automatic cardioverter/defibrillator) present    St. Jude   Anxiety    Ativan   Chronic combined systolic and diastolic CHF, NYHA class 3 (HCC) 10/2016   Nonischemic cardiomyopathy. EF 20-25%.   Chronic left hip pain    Depression    history of   Dysrhythmia    A-FIB   GI bleed 03/2016   Headache    Hypertensive heart disease with combined systolic and diastolic congestive heart failure (HCC) 10/2016   Nonischemic cardiomyopathy (HCC) 10/2016   Echo with EF 20-25%. Cardiac catheterization with no CAD. LVEDP was 41 mmHg, PCWP 36 mmHg   Osteoarthritis    right shoulder   Right hand fracture    Seasonal allergies    Sleep apnea    wears a CPAP   Wears glasses    Past Surgical History:  Procedure Laterality Date   CARDIAC CATHETERIZATION     CARDIOVERSION N/A 09/01/2019   Procedure: CARDIOVERSION;  Surgeon: Laurey Morale, MD;  Location: [facility] ENDOSCOPY;  Service: Cardiovascular;  Laterality: N/A;   ESOPHAGOGASTRODUODENOSCOPY (EGD) WITH PROPOFOL N/A 03/20/2016   Procedure: ESOPHAGOGASTRODUODENOSCOPY (EGD) WITH PROPOFOL;  Surgeon: Charlott Rakes, MD;  Location: [facility] ENDOSCOPY;  Service: Endoscopy;  Laterality: N/A;   ICD IMPLANT N/A 05/06/2018   Procedure: ICD IMPLANT;  Surgeon: Thurmon Fair, MD;  Location: [facility] INVASIVE CV LAB;  Service: Cardiovascular;  Laterality: N/A;   IRRIGATION AND DEBRIDEMENT SHOULDER Left 11/11/2019    Procedure: LEFT SHOULDER IRRIGATION AND DEBRIDEMENT WITH POLY EXCHANGE;  Surgeon: Jones Broom, MD;  Location: [facility] ORS;  Service: Orthopedics;  Laterality: Left;   KYPHOPLASTY N/A 02/22/2021   Procedure: KYPHOPLASTY T12;  Surgeon: Venita Lick, MD;  Location: [facility] OR;  Service: Orthopedics;  Laterality: N/A;  90 mins   ORIF WRIST FRACTURE Left 02/04/2022   Procedure: OPEN REDUCTION INTERNAL FIXATION (ORIF) WRIST FRACTURE;  Surgeon: Bradly Bienenstock, MD;  Location: [facility] OR;  Service: Orthopedics;  Laterality: Left;  with IV sedation   RIGHT/LEFT HEART CATH AND CORONARY ANGIOGRAPHY N/A 10/21/2016   Procedure: Right/Left Heart Cath and Coronary Angiography;  Surgeon: Runell Gess, MD;  Location: [facility] INVASIVE CV LAB;  Service: Cardiovascular: Angiographically normal coronary arteries. PCWP 33-36 mmHg, LVEDP 41 mmHg. PA pressure 60/35, mean 46 mmHg.  Cardiac output/cardiac index-3.93 /1.76 Hiram Comber), 3.49/1.57 (thermodilution)   TEE WITHOUT CARDIOVERSION N/A 09/01/2019   Procedure: TRANSESOPHAGEAL ECHOCARDIOGRAM (TEE);  Surgeon: Laurey Morale, MD;  Location: [facility] ENDOSCOPY;  Service: Cardiovascular;  Laterality: N/A;   TOTAL HIP ARTHROPLASTY Left 03/27/2015   Procedure: LEFT TOTAL HIP ARTHROPLASTY ANTERIOR APPROACH;  Surgeon: Samson Frederic, MD;  Location: [facility] OR;  Service: Orthopedics;  Laterality: Left;   TOTAL HIP ARTHROPLASTY Right 05/03/2021   Procedure: TOTAL HIP ARTHROPLASTY ANTERIOR APPROACH;  Surgeon: Samson Frederic, MD;  Location: [facility] ORS;  Service:  Orthopedics;  Laterality: Right;   TOTAL SHOULDER ARTHROPLASTY Right 11/24/2015   Procedure: RIGHT TOTAL SHOULDER ARTHROPLASTY;  Surgeon: Beverely Low, MD;  Location: [facility] OR;  Service: Orthopedics;  Laterality: Right;   TOTAL SHOULDER ARTHROPLASTY Left 10/19/2019   Procedure: REVERSE TOTAL SHOULDER ARTHROPLASTY;  Surgeon: Jones Broom, MD;  Location: [facility] ORS;  Service: Orthopedics;  Laterality: Left;   TRANSTHORACIC ECHOCARDIOGRAM  10/2016   EF 20-25%.  Diffuse hypokinesis but akinesis of the entire inferoseptal wall and apical wall. Moderate biatrial enlargement. PA pressure estimated 64 mmHg.   WISDOM TOOTH EXTRACTION     Patient Active Problem List   Diagnosis Date Noted   Persistent cough 08/27/2021   Acute conjunctivitis of right eye 08/27/2021   Osteoarthritis of right hip 05/03/2021   Osteoarthritis of left hip 05/03/2021   Aortic atherosclerosis (HCC) 03/22/2021   Thoracic compression fracture (HCC) 02/22/2021   Frequent falls 02/16/2021   Hyponatremia 02/16/2021   Acute anemia 02/16/2021   Hypotension 02/16/2021   ETOH abuse 02/16/2021   GAD (generalized anxiety disorder) 05/31/2020   Paroxysmal atrial fibrillation (HCC) 04/14/2020   S/P ICD (internal cardiac defibrillator) procedure 04/14/2020   Status post reverse total shoulder replacement, left 11/11/2019   Hyperlipidemia 05/27/2018   Unspecified fracture of upper end of left humerus, initial encounter for closed fracture 09/03/2017   Essential hypertension 11/08/2016   Non-ischemic cardiomyopathy (HCC)    Chronic combined systolic and diastolic CHF, NYHA class 3 (HCC) 10/2016   GI bleed 03/19/2016   Alcohol dependence in remission (HCC) 03/19/2016   S/P shoulder replacement 11/24/2015   Allergic rhinitis 08/21/2015   ED (erectile dysfunction) 08/21/2015   Hyperglycemia 08/21/2015   Insomnia 08/21/2015   OSA (obstructive sleep apnea) 08/21/2015   Degenerative joint disease of left hip 03/27/2015    PCP: Shelva Majestic, MD  REFERRING PROVIDER: Shelva Majestic, MD  REFERRING DIAG: [phone] (ICD-10-CM) - Encounter for other specified surgical aftercare  THERAPY DIAG:  Other low back pain  Muscle weakness (generalized)  Difficulty walking  ONSET DATE: 2022  SUBJECTIVE:                                                                                                                                                                                            SUBJECTIVE STATEMENT:  Pt states the back still feels tight but he was not in extra pain after last session. "I am out of shape. The walking kills me."  EVAL Pt states that the hip feels much better at this time. His LBP continues to bother him. He had to cancel his last PT session(s) due to breaking the  L wrist. Pt states he tripped in the parking lot and had a FOOSH type injury.  Pt was at home and tripped again and landed on the hand again. Pt had plates and screws into the L wrist.   His back pain is still bothering him with walking, bending, lifting, showering. Pt states the back pain is from the back and up. He does not have NT or radiating pain down the legs. He states that moving/walking makes it more painful. Pt is still sedentary for large parts of the day.  Walking causes pain immediately. Pt states the R hip doesn't bother him walking anymore. Pt will feeling of legs giving out occasionally but will not happen in both legs, feels more like pain that causes them to give out.    PERTINENT HISTORY:  Kyphoplasty, R THA, L THA, bilat TSA, L wrist ORIF (no precautions)   PAIN:  Are you having pain? Yes: NPRS scale: 4-5/10 Pain location: L/S on R  Pain description: shooting, aching, sharp pain Aggravating factors: standing in the shower, walking Relieving factors: tylenol    PRECAUTIONS: None  WEIGHT BEARING RESTRICTIONS No  FALLS:  Has patient fallen in last 6 months? yes  LIVING ENVIRONMENT: Lives with: lives with their family Lives in: House/apartment  OCCUPATION: Desk job  PLOF: Independent with basic ADLs  PATIENT GOALS : Pt would like to get back to exercise, lose weight, and get rid pain.    OBJECTIVE:   PATIENT SURVEYS:  FOTO 34  D/C 47  9 pts MCII   TODAY'S TREATMENT   8/21  Pt seen for aquatic therapy today.  Treatment took place in water 3.25-4 ft in depth at the [facility] pool. Temp of water was 91.  Pt entered/exited the pool via  stairs (step to) independently with bilat rail.   Warm up: 2x sidestepping, retro walking, fwd walking; standing 3 way flexion stretch 5s 10x with noodle   Standing trunk rotation with noodle 2x10 Standing minisquat 2x10 with noodle support  STS from 3rd step, intermittent UE support 4x5 Seated fig 4 stretch 30s 3x each Fwd lunge 2x10 each leg Standing hip ABD 2x10  Seated flutter kicking flexion 2x20 Marching 3x laps across pool  Sidestep with squat 3x laps Deep water cycling with noodle support 2x20     Pt requires buoyancy for support and to offload joints with strengthening exercises. Viscosity of the water is needed for resistance of strengthening; water current perturbations provides challenge to standing balance unsupported, requiring increased core activation.    8/10  Pt seen for aquatic therapy today.  Treatment took place in water 3.25-4 ft in depth at the [facility] pool. Temp of water was 91.  Pt entered/exited the pool via stairs (step to) independently with bilat rail.   Warm up: 3x sidestepping, retro walking, fwd walking; standing 3 way flexion stretch 5s 10x with noodle   Standing trunk rotation with noodle 2x10 Standing minisquat 2x10 with noodle support  Standing SKTC with back against pool wall 10x Seated fig 4 stretch 30s 3x each Standing hip ABD 2x10  Seated flutter kicking flexion and ABD/ADD 2x10 Seated lumbar flexion stretch 5s 10x Deep water walking with yellow noodle support 4x laps     Pt requires buoyancy for support and to offload joints with strengthening exercises. Viscosity of the water is needed for resistance of strengthening; water current perturbations provides challenge to standing balance unsupported, requiring increased core activation.    Previous: Exercises - Supine Lower  Trunk Rotation  - 2 x daily - 7 x weekly - 2 sets - 10 reps - 2 hold - Supine Posterior Pelvic Tilt  - 2 x daily - 7 x weekly - 2 sets - 10 reps - 2  hold - Hooklying Single Knee to Chest Stretch  - 2 x daily - 7 x weekly - 2 sets - 10 reps - 5 hold - Sit to Stand with Arms Crossed  - 1 x daily - 7 x weekly - 1 sets - 5 reps  PATIENT EDUCATION:  Education details: anatomy, exercise progression, DOMS expectations, muscle firing,  envelope of function, HEP, POC  Person educated: Patient Education method: Explanation, Demonstration, Tactile cues, Verbal cues, and Handouts Education comprehension: verbalized understanding, returned demonstration, verbal cues required, and tactile cues required   HOME EXERCISE PROGRAM:  Access Code: [email or web address] URL: [email or web address] Date: 01/08/2022 Prepared by: Zebedee Iba  ASSESSMENT:  CLINICAL IMPRESSION: Pt able to tolerate progressive exercise today in aquatic environment without increased pain. Pt also with improved endurance and less seated rest breaks required. Pt without pain noted during session even with low seat squatting. Pt still appears to have flexion preference but no pain noted with supported rotation. Pt is still largely limited by lumbopelvic strength and general endurance. Formal aquatic HEP emailed to pt to start at his neighborhood pool. Pt would benefit from continued skilled therapy in order to reach goals and maximize functional lumbar and hip strength and ROM for prevention of further functional decline.     OBJECTIVE IMPAIRMENTS Abnormal gait, decreased activity tolerance, decreased endurance, decreased mobility, difficulty walking, decreased ROM, decreased strength, hypomobility, increased fascial restrictions, increased muscle spasms, impaired flexibility, impaired sensation, improper body mechanics, postural dysfunction, and pain.   ACTIVITY LIMITATIONS cleaning, community activity, driving, occupation, yard work, shopping, and exercise .   PERSONAL FACTORS Age, Behavior pattern, Fitness, Past/current experiences, Time since onset of injury/illness/exacerbation,  and 1-2 comorbidities:    are also affecting patient's functional outcome.    REHAB POTENTIAL: Fair    CLINICAL DECISION MAKING: Evolving/moderate complexity  EVALUATION COMPLEXITY: Moderate   GOALS:   SHORT TERM GOALS: Target date: 05/09/2022   Pt will become independent with HEP in order to demonstrate synthesis of PT education.   Goal status: INITIAL  2.  Pt will score at least 9 pt increase on FOTO to demonstrate functional improvement in MCII and pt perceived function.    Goal status: INITIAL  3.  Pt will be able to demonstrate STS without UE support  in order to demonstrate functional improvement in LE function for self-care and house hold duties.   Goal status: INITIAL  LONG TERM GOALS: Target date: 06/20/2022   Pt  will become independent with final HEP in order to demonstrate synthesis of PT education.   Goal status: INITIAL  2.  Pt will score >/= 47 on FOTO to demonstrate improvement in perceived R hip and L/S function.   Goal status: INITIAL  3.  Pt will be able to perform 5XSTS in under 12s  in order to demonstrate functional improvement above the cut off score for adults.    Goal status: INITIAL  4.  Pt will be able to demonstrate/report ability to walk >15 mins with or without pain in order to demonstrate functional improvement and tolerance to exercise and community mobility.   Goal status: INITIAL  5. Pt will be able to demonstrate ability to swing a golf club 75% without pain in order to demonstrate functional improvement  in lumbar pain for return to exercise and recreation.    Goal status: INITIAL   PLAN: PT FREQUENCY: 1-2x/week  PT DURATION: 12 weeks (likely D/C by 10wks)  PLANNED INTERVENTIONS: Therapeutic exercises, Therapeutic activity, Neuromuscular re-education, Balance training, Gait training, Patient/Family education, Joint manipulation, Joint mobilization, Stair training, Orthotic/Fit training, DME instructions, Aquatic Therapy,  Dry Needling, Electrical stimulation, Spinal manipulation, Spinal mobilization, Cryotherapy, Moist heat, scar mobilization, Taping, Vasopneumatic device, Traction, Ultrasound, Ionotophoresis 4mg /ml Dexamethasone, and Manual therapy.  PLAN FOR NEXT SESSION:  L/S stretching, bilat hip stretching, lumbopelvic strength, endurance, develop aquatic HEP for home pool   , PT 05/06/2022, 3:57 PM

## 2022-05-11 ENCOUNTER — Other Ambulatory Visit (HOSPITAL_COMMUNITY): Payer: Self-pay | Admitting: Cardiology

## 2022-05-14 ENCOUNTER — Encounter: Payer: Self-pay | Admitting: Cardiology

## 2022-05-14 ENCOUNTER — Ambulatory Visit: Payer: Commercial Managed Care - HMO | Admitting: Podiatry

## 2022-05-14 ENCOUNTER — Ambulatory Visit: Payer: Commercial Managed Care - HMO | Admitting: Family Medicine

## 2022-05-14 DIAGNOSIS — L539 Erythematous condition, unspecified: Secondary | ICD-10-CM | POA: Diagnosis not present

## 2022-05-14 DIAGNOSIS — L600 Ingrowing nail: Secondary | ICD-10-CM

## 2022-05-14 MED ORDER — DOXYCYCLINE HYCLATE 100 MG PO TABS: 100.0000 mg | ORAL_TABLET | Freq: Two times a day (BID) | ORAL | 0 refills | Status: DC

## 2022-05-14 NOTE — Progress Notes (Signed)
Subjective:  Patient ID: John [name], male    DOB: 22-May-1961,  MRN: [id number]  No chief complaint on file.   61 y.o. male presents with the above complaint.  Patient presents with right hallux lateral border ingrown.  Patient states painful to touch is progressive gotten worse.  He went to nail salon where the dog it got infected.  He is not a diabetic there are some redness associate with that he has not taken any antibiotics.  He would like to discuss treatment options were pain scale 7 out of 10 hurts with ambulation hurts with pressure   Review of Systems: Negative except as noted in the HPI. Denies N/V/F/Ch.  Past Medical History:  Diagnosis Date   AICD (automatic cardioverter/defibrillator) present    St. Jude   Anxiety    Ativan   Chronic combined systolic and diastolic CHF, NYHA class 3 (HCC) 10/2016   Nonischemic cardiomyopathy. EF 20-25%.   Chronic left hip pain    Depression    history of   Dysrhythmia    A-FIB   GI bleed 03/2016   Headache    Hypertensive heart disease with combined systolic and diastolic congestive heart failure (HCC) 10/2016   Nonischemic cardiomyopathy (HCC) 10/2016   Echo with EF 20-25%. Cardiac catheterization with no CAD. LVEDP was 41 mmHg, PCWP 36 mmHg   Osteoarthritis    right shoulder   Right hand fracture    Seasonal allergies    Sleep apnea    wears a CPAP   Wears glasses     Current Outpatient Medications:    doxycycline (VIBRA-TABS) 100 MG tablet, Take 1 tablet (100 mg total) by mouth 2 (two) times daily., Disp: 28 tablet, Rfl: 0   acetaminophen (TYLENOL) 325 MG tablet, Take 1-2 tablets (325-650 mg total) by mouth every 6 (six) hours as needed for mild pain (pain score 1-3 or temp > 100.5)., Disp: , Rfl:    carvedilol (COREG) 25 MG tablet, Take 25 mg by mouth 2 (two) times daily., Disp: , Rfl:    diphenhydramine-acetaminophen (TYLENOL PM) 25-500 MG TABS tablet, Take 2 tablets by mouth at bedtime., Disp: , Rfl:    docusate  sodium (COLACE) 100 MG capsule, Take 1 capsule (100 mg total) by mouth 2 (two) times daily., Disp: 10 capsule, Rfl: 0   ELIQUIS 5 MG TABS tablet, TAKE 1 TABLET BY MOUTH TWICE A DAY, Disp: 180 tablet, Rfl: 3   ENTRESTO 97-103 MG, TAKE 1 TABLET BY MOUTH TWICE A DAY, Disp: 60 tablet, Rfl: 5   escitalopram (LEXAPRO) 10 MG tablet, TAKE 1 TABLET BY MOUTH EVERY DAY, Disp: 90 tablet, Rfl: 2   FARXIGA 10 MG TABS tablet, TAKE 1 TABLET BY MOUTH EVERY DAY BEFORE BREAKFAST, Disp: 90 tablet, Rfl: 3   fluticasone (FLONASE) 50 MCG/ACT nasal spray, Place 1 spray into both nostrils daily. Use twice a day until symptoms under control, then reduce to daily use. (Patient not taking: Reported on 04/24/2022), Disp: 16 g, Rfl: 2   furosemide (LASIX) 40 MG tablet, TAKE 1.5 TABLETS (60 MG TOTAL) BY MOUTH EVERY MORNING AND 1.5 TABLETS (60 MG TOTAL) EVERY EVENING., Disp: 270 tablet, Rfl: 3   hydrOXYzine (ATARAX) 25 MG tablet, Take 25 mg by mouth as needed., Disp: , Rfl:    rosuvastatin (CRESTOR) 20 MG tablet, TAKE 1 TABLET BY MOUTH EVERY DAY, Disp: 90 tablet, Rfl: 3   sertraline (ZOLOFT) 100 MG tablet, Take 100 mg by mouth daily., Disp: , Rfl:    sildenafil (  VIAGRA) 100 MG tablet, TAKE 0.5 TABLETS (50 MG TOTAL) BY MOUTH AT BEDTIME AS NEEDED FOR ERECTILE DYSFUNCTION., Disp: 10 tablet, Rfl: 3   spironolactone (ALDACTONE) 50 MG tablet, Take 1 tablet (50 mg total) by mouth daily., Disp: 90 tablet, Rfl: 3   torsemide (DEMADEX) 20 MG tablet, TAKE 3 TABLETS BY MOUTH EVERY DAY, Disp: 270 tablet, Rfl: 1   traZODone (DESYREL) 50 MG tablet, TAKE 1 TABLET BY MOUTH EVERY DAY AT BEDTIME AS NEEDED, Disp: 30 tablet, Rfl: 1  Social History   Tobacco Use  Smoking Status Never  Smokeless Tobacco Never    Allergies  Allergen Reactions   Cymbalta [Duloxetine Hcl] Nausea Only and Other (See Comments)    Night sweats     Hydrocodone Rash   Objective:  There were no vitals filed for this visit. There is no height or weight on file to  calculate BMI. Constitutional Well developed. Well nourished.  Vascular Dorsalis pedis pulses palpable bilaterally. Posterior tibial pulses palpable bilaterally. Capillary refill normal to all digits.  No cyanosis or clubbing noted. Pedal hair growth normal.  Neurologic Normal speech. Oriented to person, place, and time. Epicritic sensation to light touch grossly present bilaterally.  Dermatologic Painful ingrowing nail at lateral nail borders of the hallux nail right.  There is underlying erythema noted from ingrown consistent with infection.  No redness noted No other open wounds. No skin lesions.  Orthopedic: Normal joint ROM without pain or crepitus bilaterally. No visible deformities. No bony tenderness.   Radiographs: None Assessment:   1. Ingrown toenail of right foot   2. Erythema    Plan:  Patient was evaluated and treated and all questions answered.  Ingrown Nail, right with under lying erythema -Patient elects to proceed with minor surgery to remove ingrown toenail removal today. Consent reviewed and signed by patient. -Ingrown nail excised. See procedure note. -Educated on post-procedure care including soaking. Written instructions provided and reviewed. -Patient to follow up in 2 weeks for nail check. -Doxycycline was dispensed for skin and soft tissue prophylaxis  Procedure: Excision of Ingrown Toenail Location: Right 1st toe lateral nail borders. Anesthesia: Lidocaine 1% plain; 1.5 mL and Marcaine 0.5% plain; 1.5 mL, digital block. Skin Prep: Betadine. Dressing: Silvadene; telfa; dry, sterile, compression dressing. Technique: Following skin prep, the toe was exsanguinated and a tourniquet was secured at the base of the toe. The affected nail border was freed, split with a nail splitter, and excised. Chemical matrixectomy was then performed with phenol and irrigated out with alcohol. The tourniquet was then removed and sterile dressing applied. Disposition:  Patient tolerated procedure well. Patient to return in 2 weeks for follow-up.   No follow-ups on file.

## 2022-05-17 ENCOUNTER — Ambulatory Visit (INDEPENDENT_AMBULATORY_CARE_PROVIDER_SITE_OTHER): Payer: Commercial Managed Care - HMO | Admitting: Family Medicine

## 2022-05-17 ENCOUNTER — Other Ambulatory Visit (HOSPITAL_COMMUNITY): Payer: Self-pay | Admitting: Cardiology

## 2022-05-17 ENCOUNTER — Encounter: Payer: Self-pay | Admitting: Family Medicine

## 2022-05-17 VITALS — BP 110/64 | HR 68 | Temp 97.9°F | Ht 71.0 in | Wt 273.4 lb

## 2022-05-17 DIAGNOSIS — I10 Essential (primary) hypertension: Secondary | ICD-10-CM

## 2022-05-17 DIAGNOSIS — I480 Paroxysmal atrial fibrillation: Secondary | ICD-10-CM | POA: Diagnosis not present

## 2022-05-17 DIAGNOSIS — Z1211 Encounter for screening for malignant neoplasm of colon: Secondary | ICD-10-CM

## 2022-05-17 DIAGNOSIS — R21 Rash and other nonspecific skin eruption: Secondary | ICD-10-CM

## 2022-05-17 DIAGNOSIS — I700 Atherosclerosis of aorta: Secondary | ICD-10-CM | POA: Diagnosis not present

## 2022-05-17 DIAGNOSIS — I5042 Chronic combined systolic (congestive) and diastolic (congestive) heart failure: Secondary | ICD-10-CM | POA: Diagnosis not present

## 2022-05-17 DIAGNOSIS — F411 Generalized anxiety disorder: Secondary | ICD-10-CM

## 2022-05-17 DIAGNOSIS — F1021 Alcohol dependence, in remission: Secondary | ICD-10-CM

## 2022-05-17 MED ORDER — PREDNISONE 20 MG PO TABS: ORAL_TABLET | ORAL | 0 refills | Status: DC

## 2022-05-17 MED ORDER — KETOCONAZOLE 2 % EX CREA: 1.0000 | TOPICAL_CREAM | Freq: Every day | CUTANEOUS | 1 refills | Status: DC

## 2022-05-17 NOTE — Progress Notes (Signed)
Phone [phone] In person visit   Subjective:   John [name] is a 61 y.o. year old very pleasant male patient who presents for/with See problem oriented charting Chief Complaint  Patient presents with   Referral    Pt would like a referral to dermatology for 2 spots on legs.   Cough    Pt c/o dry cough in the mornings and watery eyes.   Back Pain    Pt states he has had his back worked on and in aqua therapy but is still having tremendous amounts of pain and would like medicine for it   Past Medical History-  Patient Active Problem List   Diagnosis Date Noted   Paroxysmal atrial fibrillation ([place]) 04/14/2020    Priority: High   S/P ICD (internal cardiac defibrillator) procedure 04/14/2020    Priority: High   Status post reverse total shoulder replacement, left 11/11/2019    Priority: High   Non-ischemic cardiomyopathy ([place])     Priority: High   Chronic combined systolic and diastolic CHF, NYHA class 3 ([place]) 10/2016    Priority: High   Alcohol dependence in remission ([place]) 03/19/2016    Priority: High   Aortic atherosclerosis ([place]) 03/22/2021    Priority: Medium    GAD (generalized anxiety disorder) 05/31/2020    Priority: Medium    Hyperlipidemia 05/27/2018    Priority: Medium    Essential hypertension 11/08/2016    Priority: Medium    GI bleed 03/19/2016    Priority: Medium    ED (erectile dysfunction) 08/21/2015    Priority: Medium    Hyperglycemia 08/21/2015    Priority: Medium    Insomnia 08/21/2015    Priority: Medium    OSA (obstructive sleep apnea) 08/21/2015    Priority: Medium    Unspecified fracture of upper end of left humerus, initial encounter for closed fracture 09/03/2017    Priority: Low   S/P shoulder replacement 11/24/2015    Priority: Low   Allergic rhinitis 08/21/2015    Priority: Low   Degenerative joint disease of left hip 03/27/2015    Priority: Low   Persistent cough 08/27/2021   Acute conjunctivitis of right eye 08/27/2021    Osteoarthritis of right hip 05/03/2021   Osteoarthritis of left hip 05/03/2021   Thoracic compression fracture (HCC) 02/22/2021   Frequent falls 02/16/2021   Hyponatremia 02/16/2021   Acute anemia 02/16/2021   Hypotension 02/16/2021   ETOH abuse 02/16/2021    Medications- reviewed and updated Current Outpatient Medications  Medication Sig Dispense Refill   acetaminophen (TYLENOL) 325 MG tablet Take 1-2 tablets (325-650 mg total) by mouth every 6 (six) hours as needed for mild pain (pain score 1-3 or temp > 100.5).     carvedilol (COREG) 25 MG tablet Take 25 mg by mouth 2 (two) times daily.     diphenhydramine-acetaminophen (TYLENOL PM) 25-500 MG TABS tablet Take 2 tablets by mouth at bedtime.     docusate sodium (COLACE) 100 MG capsule Take 1 capsule (100 mg total) by mouth 2 (two) times daily. 10 capsule 0   doxycycline (VIBRA-TABS) 100 MG tablet Take 1 tablet (100 mg total) by mouth 2 (two) times daily. 28 tablet 0   ELIQUIS 5 MG TABS tablet TAKE 1 TABLET BY MOUTH TWICE A DAY 180 tablet 3   ENTRESTO 97-103 MG TAKE 1 TABLET BY MOUTH TWICE A DAY 60 tablet 5   escitalopram (LEXAPRO) 10 MG tablet TAKE 1 TABLET BY MOUTH EVERY DAY 90 tablet 2   FARXIGA  10 MG TABS tablet TAKE 1 TABLET BY MOUTH EVERY DAY BEFORE BREAKFAST 90 tablet 3   fluticasone (FLONASE) 50 MCG/ACT nasal spray Place 1 spray into both nostrils daily. Use twice a day until symptoms under control, then reduce to daily use. 16 g 2   hydrOXYzine (ATARAX) 25 MG tablet Take 25 mg by mouth as needed.     ketoconazole (NIZORAL) 2 % cream Apply 1 Application topically daily. To all rash areas- also pending dermatology visit. Use for at least 2 weeks and at least a week after resolution. 60 g 1   predniSONE (DELTASONE) 20 MG tablet Take 2 pills for 3 days, 1 pill for 4 days 10 tablet 0   rosuvastatin (CRESTOR) 20 MG tablet TAKE 1 TABLET BY MOUTH EVERY DAY 90 tablet 3   sertraline (ZOLOFT) 100 MG tablet Take 100 mg by mouth daily.      sildenafil (VIAGRA) 100 MG tablet TAKE 0.5 TABLETS (50 MG TOTAL) BY MOUTH AT BEDTIME AS NEEDED FOR ERECTILE DYSFUNCTION. 10 tablet 3   spironolactone (ALDACTONE) 50 MG tablet Take 1 tablet (50 mg total) by mouth daily. 90 tablet 3   torsemide (DEMADEX) 20 MG tablet TAKE 3 TABLETS BY MOUTH EVERY DAY 270 tablet 1   traZODone (DESYREL) 50 MG tablet TAKE 1 TABLET BY MOUTH EVERY DAY AT BEDTIME AS NEEDED 30 tablet 1   No current facility-administered medications for this visit.     Objective:  BP 110/64   Pulse 68   Temp 97.9 F (36.6 C)   Ht 5\' 11"  (1.803 m)   Wt 273 lb 6.4 oz (124 kg)   SpO2 97%   BMI 38.13 kg/m  Gen: NAD, resting comfortably CV: RRR no murmurs rubs or gallops Lungs: CTAB no crackles, wheeze, rhonchi Abdomen: soft/nontender/nondistended/normal bowel sounds. No rebound or guarding.  Ext: trace to 1+ edema Skin: warm, dry     Assessment and Plan   # cough S:cough in mornings along with watery eyes- started 3 weeks ago. Mainly dry but some clear sputum. Ongoing nasal congestion. Feels allergies contributing- compliant with prior flonase- did much better with prednisone in December 2022 and wants to trial A/P: cough for 3 weeks- thinks allergies contributing-did well in past with prednisone in December 2022 and hed like to retrial-  if fails to improve he will follow up with January 2023 - hold off on antibiotics unless sinuses fail to improve (could be underlying bacterial sinusitis as well)  # Skin lesions S:2 spots on his legs he would like looked at by dermatologist.  Few months- itches some. No significant worsening A/P: has 2 slightly circular lesions with raised borders on left leg and multiple but similar on right leg . Treat with topical ketoconazole- possibly fungal- refer to derm in case fails to improve and wouldn't hurt to have baseline exam even if resolves.   # Back Pain S: ongoing issues with back pain- I have notes at least to 2 years ago. Reports tried aqua  therapy without substantial relief.  -had kyphoplasty June 2022 with Dr. July 2022 he believes. Prior hip replacement.  -also had wrist fracture [facility]- slipped in pothole - looking at notes from [facility] 01/16/22- they noted 8/10 back pain at that time and DDD L5-S1- they recommended aquatic therapy at that time which he has been doing as above. He has not yet had the 2nd opinion on possible back surgery from Dr. 03/18/22 yet (was seeing Dr. Marikay Alar). From  his notes "If surgery not recommended then I recommend bilateral L3, L4 medial branch block and bilateral L5 dorsal ramus block." - still with 8/10 pain up to 9/10 pain in low back without radiation into legs. No incontinence bowel or bladder. No saddle anesthesia - tylenol arthritis only helps minimally and tylenol pm at night A/P: for ongoing chronic back pain that [facility] has been managing - apperas they thought injections as listed in note would be the next best step- encouraged Mr. Kunz to call to schedule follow up. Nsaids not recommended due to eliquis." -reviewed state drug database as considering tramadol- had been receiving oxycodone through [facility] with last rx 03/14/22- with narcotic use recommended this being consolidated in one place- should reach out   # Atrial fibrillation S: Rate controlled with coreg 25 mg BID Anticoagulated with eliquis 5 mg BID A/P: appropriately rate controlled and anticoagulated- continue current meds  #CHF #hypertension S: medication: Entresto 97-103 twice daily, farxiga 10 mg through Dr. Shirlee Latch,, torsemide 60 mg daily listed- and patient states taking this and not furosemide, coreg 25 mg BID, spironolactone 50 mg - no recent weight gain or increased swelling. Some cough as above BP Readings from Last 3 Encounters:  05/17/22 110/64  04/24/22 122/70  02/04/22 (!) 108/50  A/P: CHF - edema trace to 1+- continue current meds- encouraged compliance, encouraged him to  watch salt intake (has been eating out too much). HTN- Controlled. Continue current medications.  -got an exercise bike he is going to try to use  #alcohol abuse history- thankfully only drinking 2 beers every other day- recommended full cessation given history- appears in remisison but would still prefer no intake  # Anxiety/GAD/insomnia S:Medication:  last year restarted lexapro 10 mg and he was trying to get plugged in with therapy- later changed to sertraline he believes - reports [facility] - previously advised remain off ambien due to alcohol history for insomnia- on trazodone instead- usually doesn't need- tylenol pm helps A/P: doing much better on sertraline- continue current meds   #hyperlipidemia with aortic atherosclerosis S: Medication:rosuvastatin 20 mg  Lab Results  Component Value Date   CHOL 138 09/24/2021   HDL 58 09/24/2021   LDLCALC 58 09/24/2021   TRIG 112 09/24/2021   CHOLHDL 2.4 09/24/2021   A/P: LDL under 70- at goal for aortic athero (presumed stable)- continue current meds  #HM- anemia noted- STRONGLY encouraged complete colonoscopy but has had intermittent issues  Recommended follow up: Return in about 6 months (around 11/15/2022) for physical or sooner if needed.Schedule b4 you leave. Future Appointments  Date Time Provider Department Center  06/06/2022  3:30 PM Jeanmarie Hubert, PT [facility]  07/30/2022  7:00 AM [facility] DEVICE REMOTES [facility]  07/31/2022 11:00 AM [facility] PA/NP [facility] None    Lab/Order associations:   ICD-10-CM   1. Rash  R21 Ambulatory referral to Dermatology    2. Chronic combined systolic and diastolic CHF, NYHA class 3 (HCC)  I50.42     3. Paroxysmal atrial fibrillation (HCC)  I48.0     4. Aortic atherosclerosis (HCC)  I70.0     5. Essential hypertension  I10     6. GAD (generalized anxiety disorder)  F41.1     7. Alcohol dependence in remission (HCC)  F10.21     8. Screen for colon cancer   Z12.11 Ambulatory referral to Gastroenterology      Meds ordered this encounter  Medications   ketoconazole (NIZORAL) 2 % cream  Sig: Apply 1 Application topically daily. To all rash areas- also pending dermatology visit. Use for at least 2 weeks and at least a week after resolution.    Dispense:  60 g    Refill:  1   predniSONE (DELTASONE) 20 MG tablet    Sig: Take 2 pills for 3 days, 1 pill for 4 days    Dispense:  10 tablet    Refill:  0    Return precautions advised.  Garret Reddish, MD

## 2022-05-17 NOTE — Patient Instructions (Addendum)
[facility] contact Please call to schedule visit and/or procedure Address: [address] [place], [place], [place] [address] Phone: [phone]   for ongoing chronic back pain that [facility] has been managing - apperas they thought injections as listed in note would be the next best step- encouraged John [name] to call to schedule follow up - they also were prescribing pain medicine and can address at that visit  Try ketoconazole daily after shower. We will call you within two weeks about your referral to dermatology. If you do not hear within 2 weeks, give [place] a call.   Try prednisone for cough- follow up if fail to improve  Recommended follow up: Return in about 6 months (around 11/15/2022) for physical or sooner if needed.Schedule b4 you leave.

## 2022-05-22 ENCOUNTER — Telehealth: Payer: Self-pay | Admitting: Cardiology

## 2022-05-22 NOTE — Telephone Encounter (Signed)
05/22/22 LVM to schedule sleep compliance appt

## 2022-05-29 ENCOUNTER — Other Ambulatory Visit (HOSPITAL_COMMUNITY): Payer: Self-pay

## 2022-05-31 ENCOUNTER — Encounter (HOSPITAL_BASED_OUTPATIENT_CLINIC_OR_DEPARTMENT_OTHER): Payer: Self-pay

## 2022-05-31 ENCOUNTER — Ambulatory Visit (HOSPITAL_BASED_OUTPATIENT_CLINIC_OR_DEPARTMENT_OTHER): Payer: Commercial Managed Care - HMO | Admitting: Physical Therapy

## 2022-05-31 NOTE — Progress Notes (Signed)
Remote ICD transmission.   

## 2022-06-01 ENCOUNTER — Other Ambulatory Visit: Payer: Self-pay | Admitting: Family Medicine

## 2022-06-06 ENCOUNTER — Telehealth: Payer: Self-pay | Admitting: Family Medicine

## 2022-06-06 ENCOUNTER — Ambulatory Visit (HOSPITAL_BASED_OUTPATIENT_CLINIC_OR_DEPARTMENT_OTHER): Payer: Commercial Managed Care - HMO | Admitting: Physical Therapy

## 2022-06-06 DIAGNOSIS — G8929 Other chronic pain: Secondary | ICD-10-CM

## 2022-06-06 NOTE — Telephone Encounter (Signed)
Patient states: - He was informed he would be receiving a referral to a chiropractor  After not being able to find any information on such a referral, pt requested a referral for a chiropractor

## 2022-06-06 NOTE — Telephone Encounter (Signed)
Referral has been placed. 

## 2022-06-07 ENCOUNTER — Ambulatory Visit (HOSPITAL_BASED_OUTPATIENT_CLINIC_OR_DEPARTMENT_OTHER): Payer: Commercial Managed Care - HMO | Admitting: Physical Therapy

## 2022-06-07 ENCOUNTER — Other Ambulatory Visit (HOSPITAL_COMMUNITY): Payer: Self-pay | Admitting: Cardiology

## 2022-06-13 ENCOUNTER — Ambulatory Visit (HOSPITAL_BASED_OUTPATIENT_CLINIC_OR_DEPARTMENT_OTHER): Payer: Commercial Managed Care - HMO | Attending: Orthopedic Surgery | Admitting: Physical Therapy

## 2022-06-13 ENCOUNTER — Telehealth: Payer: Self-pay | Admitting: Family Medicine

## 2022-06-13 ENCOUNTER — Encounter (HOSPITAL_BASED_OUTPATIENT_CLINIC_OR_DEPARTMENT_OTHER): Payer: Self-pay | Admitting: Physical Therapy

## 2022-06-13 DIAGNOSIS — M6281 Muscle weakness (generalized): Secondary | ICD-10-CM | POA: Diagnosis present

## 2022-06-13 DIAGNOSIS — R262 Difficulty in walking, not elsewhere classified: Secondary | ICD-10-CM | POA: Insufficient documentation

## 2022-06-13 DIAGNOSIS — M5459 Other low back pain: Secondary | ICD-10-CM | POA: Diagnosis present

## 2022-06-13 NOTE — Telephone Encounter (Signed)
From last note "cough for 3 weeks- thinks allergies contributing-did well in past with prednisone in December 2022 and hed like to retrial-  if fails to improve he will follow up with [place] - hold off on antibiotics unless sinuses fail to improve (could be underlying bacterial sinusitis as well)" -he agreed to come back in for visit if he did not improve- sounds he improved but worsened- still think that warrants visit to evaluate more fully

## 2022-06-13 NOTE — Telephone Encounter (Signed)
FYI

## 2022-06-13 NOTE — Telephone Encounter (Signed)
Patient called to check status of medication request. Patient states the dry cough he has is making him unable to sleep.

## 2022-06-13 NOTE — Telephone Encounter (Signed)
Pt sates: -previously saw PCP for nagging cough and was prescribed a medication.  -Medication helped a lot, but cough has come back. -cough is keeping him up at night.   Pt requests: -PCP team send Rx again.   Pt declined ov   Pharmacy: [id number] - [place], [place] - [address] [place], [place] [address]  Phone:  [phone]  Fax:  [phone]  DEA #:  [id number]

## 2022-06-13 NOTE — Therapy (Signed)
OUTPATIENT PHYSICAL THERAPY TREATMENT NOTE  Patient Name: John [name] MRN: [id number] DOB:02-28-1961, 61 y.o., male Today's Date: 06/13/2022   PT End of Session - 06/13/22 1652     Visit Number 5    Number of Visits 22    Date for PT Re-Evaluation 06/26/22    Authorization Type [employer]    PT Start Time 1515    PT Stop Time 1555    PT Time Calculation (min) 40 min    Activity Tolerance Patient tolerated treatment well    Behavior During Therapy WFL for tasks assessed/performed                Past Medical History:  Diagnosis Date   AICD (automatic cardioverter/defibrillator) present    St. Jude   Anxiety    Ativan   Chronic combined systolic and diastolic CHF, NYHA class 3 ([place]) 10/2016   Nonischemic cardiomyopathy. EF 20-25%.   Chronic left hip pain    Depression    history of   Dysrhythmia    A-FIB   GI bleed 03/2016   Headache    Hypertensive heart disease with combined systolic and diastolic congestive heart failure ([place]) 10/2016   Nonischemic cardiomyopathy ([place]) 10/2016   Echo with EF 20-25%. Cardiac catheterization with no CAD. LVEDP was 41 mmHg, PCWP 36 mmHg   Osteoarthritis    right shoulder   Right hand fracture    Seasonal allergies    Sleep apnea    wears a CPAP   Wears glasses    Past Surgical History:  Procedure Laterality Date   CARDIAC CATHETERIZATION     CARDIOVERSION N/A 09/01/2019   Procedure: CARDIOVERSION;  Surgeon: Larey Dresser, MD;  Location[facility] ENDOSCOPY;  Service: Cardiovascular;  Laterality: N/A;   ESOPHAGOGASTRODUODENOSCOPY (EGD) WITH PROPOFOL N/A 03/20/2016   Procedure: ESOPHAGOGASTRODUODENOSCOPY (EGD) WITH PROPOFOL;  Surgeon: Wilford Corner, MD;  Location: [facility] ENDOSCOPY;  Service: Endoscopy;  Laterality: N/A;   ICD IMPLANT N/A 05/06/2018   Procedure: ICD IMPLANT;  Surgeon: Sanda Klein, MD;  Location: [place] CV LAB;  Service: Cardiovascular;  Laterality: N/A;   IRRIGATION AND DEBRIDEMENT SHOULDER Left 11/11/2019    Procedure: LEFT SHOULDER IRRIGATION AND DEBRIDEMENT WITH POLY EXCHANGE;  Surgeon: Tania Ade, MD;  Location: [facility] ORS;  Service: Orthopedics;  Laterality: Left;   KYPHOPLASTY N/A 02/22/2021   Procedure: KYPHOPLASTY T12;  Surgeon: Melina Schools, MD;  Location: [place];  Service: Orthopedics;  Laterality: N/A;  90 mins   ORIF WRIST FRACTURE Left 02/04/2022   Procedure: OPEN REDUCTION INTERNAL FIXATION (ORIF) WRIST FRACTURE;  Surgeon: Iran Planas, MD;  Location: [place];  Service: Orthopedics;  Laterality: Left;  with IV sedation   RIGHT/LEFT HEART CATH AND CORONARY ANGIOGRAPHY N/A 10/21/2016   Procedure: Right/Left Heart Cath and Coronary Angiography;  Surgeon: Lorretta Harp, MD;  Location: [place] CV LAB;  Service: Cardiovascular: Angiographically normal coronary arteries. PCWP 33-36 mmHg, LVEDP 41 mmHg. PA pressure 60/35, mean 46 mmHg.  Cardiac output/cardiac index-3.93 /1.76 Kathlen Brunswick), 3.49/1.57 (thermodilution)   TEE WITHOUT CARDIOVERSION N/A 09/01/2019   Procedure: TRANSESOPHAGEAL ECHOCARDIOGRAM (TEE);  Surgeon: Larey Dresser, MD;  Location: [facility] ENDOSCOPY;  Service: Cardiovascular;  Laterality: N/A;   TOTAL HIP ARTHROPLASTY Left 03/27/2015   Procedure: LEFT TOTAL HIP ARTHROPLASTY ANTERIOR APPROACH;  Surgeon: Rod Can, MD;  Location: [place];  Service: Orthopedics;  Laterality: Left;   TOTAL HIP ARTHROPLASTY Right 05/03/2021   Procedure: TOTAL HIP ARTHROPLASTY ANTERIOR APPROACH;  Surgeon: Rod Can, MD;  [facility];  Service: Orthopedics;  Laterality: Right;   TOTAL SHOULDER ARTHROPLASTY Right 11/24/2015   Procedure: RIGHT TOTAL SHOULDER ARTHROPLASTY;  Surgeon: Netta Cedars, MD;  Location: [place];  Service: Orthopedics;  Laterality: Right;   TOTAL SHOULDER ARTHROPLASTY Left 10/19/2019   Procedure: REVERSE TOTAL SHOULDER ARTHROPLASTY;  Surgeon: Tania Ade, MD;  [facility];  Service: Orthopedics;  Laterality: Left;   TRANSTHORACIC ECHOCARDIOGRAM  10/2016   EF 20-25%.  Diffuse hypokinesis but akinesis of the entire inferoseptal wall and apical wall. Moderate biatrial enlargement. PA pressure estimated 64 mmHg.   WISDOM TOOTH EXTRACTION     Patient Active Problem List   Diagnosis Date Noted   Persistent cough 08/27/2021   Acute conjunctivitis of right eye 08/27/2021   Osteoarthritis of right hip 05/03/2021   Osteoarthritis of left hip 05/03/2021   Aortic atherosclerosis ([place]) 03/22/2021   Thoracic compression fracture ([place]) 02/22/2021   Frequent falls 02/16/2021   Hyponatremia 02/16/2021   Acute anemia 02/16/2021   Hypotension 02/16/2021   ETOH abuse 02/16/2021   GAD (generalized anxiety disorder) 05/31/2020   Paroxysmal atrial fibrillation ([place]) 04/14/2020   S/P ICD (internal cardiac defibrillator) procedure 04/14/2020   Status post reverse total shoulder replacement, left 11/11/2019   Hyperlipidemia 05/27/2018   Unspecified fracture of upper end of left humerus, initial encounter for closed fracture 09/03/2017   Essential hypertension 11/08/2016   Non-ischemic cardiomyopathy ([place])    Chronic combined systolic and diastolic CHF, NYHA class 3 ([place]) 10/2016   GI bleed 03/19/2016   Alcohol dependence in remission ([place]) 03/19/2016   S/P shoulder replacement 11/24/2015   Allergic rhinitis 08/21/2015   ED (erectile dysfunction) 08/21/2015   Hyperglycemia 08/21/2015   Insomnia 08/21/2015   OSA (obstructive sleep apnea) 08/21/2015   Degenerative joint disease of left hip 03/27/2015    PCP: Marin Olp, MD  REFERRING PROVIDER: Marin Olp, MD  REFERRING DIAG: [phone] (ICD-10-CM) - Encounter for other specified surgical aftercare  THERAPY DIAG:  Other low back pain  Muscle weakness (generalized)  Difficulty walking  ONSET DATE: 2022  SUBJECTIVE:                                                                                                                                                                                            SUBJECTIVE STATEMENT:  Pt states the the back is slightly better. He will see the chiro on Monday. Was doing pool therapy at home until they close the pool.   EVAL Pt states that the hip feels much better at this time. His LBP continues to bother him. He had to cancel his last PT session(s) due to  breaking the L wrist. Pt states he tripped in the parking lot and had a [place] type injury.  Pt was at home and tripped again and landed on the hand again. Pt had plates and screws into the L wrist.   His back pain is still bothering him with walking, bending, lifting, showering. Pt states the back pain is from the back and up. He does not have NT or radiating pain down the legs. He states that moving/walking makes it more painful. Pt is still sedentary for large parts of the day.  Walking causes pain immediately. Pt states the R hip doesn't bother him walking anymore. Pt will feeling of legs giving out occasionally but will not happen in both legs, feels more like pain that causes them to give out.    PERTINENT HISTORY:  Kyphoplasty, R THA, L THA, bilat TSA, L wrist ORIF (no precautions)   PAIN:  Are you having pain? Yes: NPRS scale: 9/10 Pain location: L/S on R  Pain description: shooting, aching, sharp pain Aggravating factors: standing in the shower, walking Relieving factors: tylenol    PRECAUTIONS: None  WEIGHT BEARING RESTRICTIONS No  FALLS:  Has patient fallen in last 6 months? yes  LIVING ENVIRONMENT: Lives with: lives with their family Lives in: House/apartment  OCCUPATION: Desk job  PLOF: Independent with basic ADLs  PATIENT GOALS : Pt would like to get back to exercise, lose weight, and get rid pain.    OBJECTIVE:   PATIENT SURVEYS:  FOTO 34  D/C 47  9 pts MCII   TODAY'S TREATMENT   9/28 Trigger Point Dry-Needling  Treatment instructions: Expect mild to moderate muscle soreness. S/S of pneumothorax if dry needled over a lung field, and to seek immediate  medical attention should they occur. Patient verbalized understanding of these instructions and education.   Patient Consent Given: Yes Education (verbally/handout)provided: Yes Muscles treated: Bilat L/S multifidi L3-5 Electrical stimulation performed: N/A Treatment response/outcome: soft tissue extensibility improvement  UPA grade III L3-5 STM L3-5 paraspinals  Exercises - Supine Lower Trunk Rotation  - 2 x daily - 7 x weekly - 2 sets - 10 reps - 2 hold - Hooklying Single Knee to Chest Stretch  - 2 x daily - 7 x weekly - 2 sets - 10 reps - 5 hold - Standing Anti-Rotation Press with Anchored Resistance  - 1 x daily - 3-4 x weekly - 2 sets - 10 reps - Supine Bridge  - 1 x daily - 3-4 x weekly - 2 sets - 10 reps - Side Stepping with Resistance at Ankles  - 1 x daily - 3-4 x weekly - 1 sets - 3 reps - 65ft hold     PATIENT EDUCATION:  Education details: anatomy, exercise progression, DOMS expectations, muscle firing,  envelope of function, HEP, POC  Person educated: Patient Education method: Explanation, Demonstration, Tactile cues, Verbal cues, and Handouts Education comprehension: verbalized understanding, returned demonstration, verbal cues required, and tactile cues required   HOME EXERCISE PROGRAM:  Access Code: [email or web address] URL: [email or web address] Date: 01/08/2022 Prepared by: Daleen Bo  ASSESSMENT:  CLINICAL IMPRESSION: Pt able to progress some functional strengthening today without increased pain unless in standing for too long. Pt still most limited at this time due to functional endurance limitations. Pt's gap in care due to recent toe infection. Pt returns at this time to resume care. Pt able to update HEP at today without issue other than endurance deficits and limited tolerance to standing. Plan to alternate land  pool visits to progress functional capacity. Pt would benefit from continued skilled therapy in order to reach goals and maximize functional  lumbar and hip strength and ROM for prevention of further functional decline.     OBJECTIVE IMPAIRMENTS Abnormal gait, decreased activity tolerance, decreased endurance, decreased mobility, difficulty walking, decreased ROM, decreased strength, hypomobility, increased fascial restrictions, increased muscle spasms, impaired flexibility, impaired sensation, improper body mechanics, postural dysfunction, and pain.   ACTIVITY LIMITATIONS cleaning, community activity, driving, occupation, yard work, shopping, and exercise .   PERSONAL FACTORS Age, Behavior pattern, Fitness, Past/current experiences, Time since onset of injury/illness/exacerbation, and 1-2 comorbidities:    are also affecting patient's functional outcome.    REHAB POTENTIAL: Fair    CLINICAL DECISION MAKING: Evolving/moderate complexity  EVALUATION COMPLEXITY: Moderate   GOALS:   SHORT TERM GOALS: Target date: 05/09/2022   Pt will become independent with HEP in order to demonstrate synthesis of PT education.   Goal status: INITIAL  2.  Pt will score at least 9 pt increase on FOTO to demonstrate functional improvement in MCII and pt perceived function.    Goal status: INITIAL  3.  Pt will be able to demonstrate STS without UE support  in order to demonstrate functional improvement in LE function for self-care and house hold duties.   Goal status: INITIAL  LONG TERM GOALS: Target date: 06/20/2022   Pt  will become independent with final HEP in order to demonstrate synthesis of PT education.   Goal status: INITIAL  2.  Pt will score >/= 47 on FOTO to demonstrate improvement in perceived R hip and L/S function.   Goal status: INITIAL  3.  Pt will be able to perform 5XSTS in under 12s  in order to demonstrate functional improvement above the cut off score for adults.    Goal status: INITIAL  4.  Pt will be able to demonstrate/report ability to walk >15 mins with or without pain in order to demonstrate  functional improvement and tolerance to exercise and community mobility.   Goal status: INITIAL  5. Pt will be able to demonstrate ability to swing a golf club 75% without pain in order to demonstrate functional improvement in lumbar pain for return to exercise and recreation.    Goal status: INITIAL   PLAN: PT FREQUENCY: 1-2x/week  PT DURATION: 12 weeks (likely D/C by 10wks)  PLANNED INTERVENTIONS: Therapeutic exercises, Therapeutic activity, Neuromuscular re-education, Balance training, Gait training, Patient/Family education, Joint manipulation, Joint mobilization, Stair training, Orthotic/Fit training, DME instructions, Aquatic Therapy, Dry Needling, Electrical stimulation, Spinal manipulation, Spinal mobilization, Cryotherapy, Moist heat, scar mobilization, Taping, Vasopneumatic device, Traction, Ultrasound, Ionotophoresis 4mg /ml Dexamethasone, and Manual therapy.  PLAN FOR NEXT SESSION:  L/S stretching, bilat hip stretching, lumbopelvic strength, endurance, develop aquatic HEP for home pool   Daleen Bo, PT 06/13/2022, 5:13 PM

## 2022-06-14 NOTE — Telephone Encounter (Signed)
Evaluation is best next step- if he has worsening symptoms can seek care over the weekend- hoping he will not (since he declined visit at other office)

## 2022-06-14 NOTE — Telephone Encounter (Signed)
Patient has been scheduled with Jeanie Sewer on 10/02 @ 4pm.

## 2022-06-14 NOTE — Telephone Encounter (Signed)
FYI

## 2022-06-14 NOTE — Telephone Encounter (Signed)
See below, please schedule f/u visit for pt per Dr. Yong Channel.

## 2022-06-14 NOTE — Telephone Encounter (Signed)
Pt states: -PCP knows what's going on. -Making an appointment will waste time for all involved.   Pt acknowledged that PCP determined a visit is needed. Pt is scheduled with SH,NP at [facility] on 10/02   Pt requests: -PCP team to send in medication.

## 2022-06-14 NOTE — Telephone Encounter (Signed)
Pt needs to be evaluated in order to get Rx.

## 2022-06-17 ENCOUNTER — Ambulatory Visit (INDEPENDENT_AMBULATORY_CARE_PROVIDER_SITE_OTHER): Payer: Commercial Managed Care - HMO | Admitting: Family

## 2022-06-17 ENCOUNTER — Encounter: Payer: Self-pay | Admitting: Family

## 2022-06-17 VITALS — BP 96/52 | HR 67 | Temp 98.6°F | Ht 71.0 in | Wt 272.6 lb

## 2022-06-17 DIAGNOSIS — M545 Low back pain, unspecified: Secondary | ICD-10-CM | POA: Diagnosis not present

## 2022-06-17 DIAGNOSIS — R053 Chronic cough: Secondary | ICD-10-CM

## 2022-06-17 DIAGNOSIS — G8929 Other chronic pain: Secondary | ICD-10-CM

## 2022-06-17 MED ORDER — METHYLPREDNISOLONE ACETATE 80 MG/ML IJ SUSP
80.0000 mg | Freq: Once | INTRAMUSCULAR | Status: AC
  Administered 2022-06-17: 80 mg via INTRAMUSCULAR

## 2022-06-17 MED ORDER — METHOCARBAMOL 500 MG PO TABS: 500.0000 mg | ORAL_TABLET | Freq: Four times a day (QID) | ORAL | 0 refills | Status: DC

## 2022-06-17 NOTE — Progress Notes (Signed)
Patient ID: John [name], male    DOB: October 09, 1960, 61 y.o.   MRN: [id number]  Chief Complaint  Patient presents with   Cough   Back Pain    Pt c/o back pain for a couple of months.     HPI:      Persistent cough:   Pt c/o cough (clear mucus), stratchy throat for about 2 weeks. Has tried claritin, cough drops which does not help. Reports having cough beginning of September, seen by PCP, took prednisone pack which helped for about 2 weeks, then cough returned, reports some postnasal drip, states he is using Flonase as of a couple of days and Claritin.  Back pain:  chronic, lumbar, pt reports working with PT currently, PCP has sent a chiropractor referral, pt will see next week, only able to take Tylenol for pain which does not help.    Assessment & Plan:  1. Persistent cough for 3 weeks or longer lungs clear, Depomedrol inj given, advised on use & SE, advised can call end of week if still not resolved. Advised on using Flonase bid, continue Claritin qd, drink up to 2L water qd.  - methylPREDNISolone acetate (DEPO-MEDROL) injection 80 mg  2. Chronic bilateral low back pain without sciatica seeing PT, has referral to Chiro, giving Depomedrol today, sending Robaxin for short term, advised on use & SE.  - methylPREDNISolone acetate (DEPO-MEDROL) injection 80 mg - methocarbamol (ROBAXIN) 500 MG tablet; Take 1-2 tablets (500-1,000 mg total) by mouth 4 (four) times daily.  Dispense: 40 tablet; Refill: 0    Subjective:    Outpatient Medications Prior to Visit  Medication Sig Dispense Refill   acetaminophen (TYLENOL) 325 MG tablet Take 1-2 tablets (325-650 mg total) by mouth every 6 (six) hours as needed for mild pain (pain score 1-3 or temp > 100.5).     carvedilol (COREG) 25 MG tablet Take 25 mg by mouth 2 (two) times daily.     diphenhydramine-acetaminophen (TYLENOL PM) 25-500 MG TABS tablet Take 2 tablets by mouth at bedtime.     docusate sodium (COLACE) 100 MG capsule Take 1  capsule (100 mg total) by mouth 2 (two) times daily. 10 capsule 0   ELIQUIS 5 MG TABS tablet TAKE 1 TABLET BY MOUTH TWICE A DAY 180 tablet 3   ENTRESTO 97-103 MG TAKE 1 TABLET BY MOUTH TWICE A DAY 60 tablet 5   escitalopram (LEXAPRO) 10 MG tablet TAKE 1 TABLET BY MOUTH EVERY DAY 90 tablet 2   FARXIGA 10 MG TABS tablet TAKE 1 TABLET BY MOUTH EVERY DAY BEFORE BREAKFAST 90 tablet 3   fluticasone (FLONASE) 50 MCG/ACT nasal spray Place 1 spray into both nostrils daily. Use twice a day until symptoms under control, then reduce to daily use. 16 g 2   hydrOXYzine (ATARAX) 25 MG tablet Take 25 mg by mouth as needed.     ketoconazole (NIZORAL) 2 % cream Apply 1 Application topically daily. To all rash areas- also pending dermatology visit. Use for at least 2 weeks and at least a week after resolution. 60 g 1   rosuvastatin (CRESTOR) 20 MG tablet TAKE 1 TABLET BY MOUTH EVERY DAY 90 tablet 3   sertraline (ZOLOFT) 100 MG tablet Take 100 mg by mouth daily.     sildenafil (VIAGRA) 100 MG tablet TAKE 0.5 TABLETS (50 MG TOTAL) BY MOUTH AT BEDTIME AS NEEDED FOR ERECTILE DYSFUNCTION. 10 tablet 3   spironolactone (ALDACTONE) 50 MG tablet Take 1 tablet (50 mg total) by  mouth daily. 90 tablet 3   torsemide (DEMADEX) 20 MG tablet TAKE 3 TABLETS BY MOUTH EVERY DAY 270 tablet 1   traZODone (DESYREL) 50 MG tablet TAKE 1 TABLET BY MOUTH EVERY DAY AT BEDTIME AS NEEDED 90 tablet 3   doxycycline (VIBRA-TABS) 100 MG tablet Take 1 tablet (100 mg total) by mouth 2 (two) times daily. 28 tablet 0   predniSONE (DELTASONE) 20 MG tablet Take 2 pills for 3 days, 1 pill for 4 days (Patient not taking: Reported on 06/17/2022) 10 tablet 0   No facility-administered medications prior to visit.   Past Medical History:  Diagnosis Date   AICD (automatic cardioverter/defibrillator) present    St. Jude   Anxiety    Ativan   Chronic combined systolic and diastolic CHF, NYHA class 3 ([place]) 10/2016   Nonischemic cardiomyopathy. EF 20-25%.    Chronic left hip pain    Depression    history of   Dysrhythmia    A-FIB   GI bleed 03/2016   Headache    Hypertensive heart disease with combined systolic and diastolic congestive heart failure ([place]) 10/2016   Nonischemic cardiomyopathy ([place]) 10/2016   Echo with EF 20-25%. Cardiac catheterization with no CAD. LVEDP was 41 mmHg, PCWP 36 mmHg   Osteoarthritis    right shoulder   Right hand fracture    Seasonal allergies    Sleep apnea    wears a CPAP   Wears glasses    Past Surgical History:  Procedure Laterality Date   CARDIAC CATHETERIZATION     CARDIOVERSION N/A 09/01/2019   Procedure: CARDIOVERSION;  Surgeon: Larey Dresser, MD;  Location: [facility] ENDOSCOPY;  Service: Cardiovascular;  Laterality: N/A;   ESOPHAGOGASTRODUODENOSCOPY (EGD) WITH PROPOFOL N/A 03/20/2016   Procedure: ESOPHAGOGASTRODUODENOSCOPY (EGD) WITH PROPOFOL;  Surgeon: Wilford Corner, MD;  Location: [facility] ENDOSCOPY;  Service: Endoscopy;  Laterality: N/A;   ICD IMPLANT N/A 05/06/2018   Procedure: ICD IMPLANT;  Surgeon: Sanda Klein, MD;  Location: [place] CV LAB;  Service: Cardiovascular;  Laterality: N/A;   IRRIGATION AND DEBRIDEMENT SHOULDER Left 11/11/2019   Procedure: LEFT SHOULDER IRRIGATION AND DEBRIDEMENT WITH POLY EXCHANGE;  Surgeon: Tania Ade, MD;  [facility];  Service: Orthopedics;  Laterality: Left;   KYPHOPLASTY N/A 02/22/2021   Procedure: KYPHOPLASTY T12;  Surgeon: Melina Schools, MD;  Location: [place];  Service: Orthopedics;  Laterality: N/A;  90 mins   ORIF WRIST FRACTURE Left 02/04/2022   Procedure: OPEN REDUCTION INTERNAL FIXATION (ORIF) WRIST FRACTURE;  Surgeon: Iran Planas, MD;  Location: [place];  Service: Orthopedics;  Laterality: Left;  with IV sedation   RIGHT/LEFT HEART CATH AND CORONARY ANGIOGRAPHY N/A 10/21/2016   Procedure: Right/Left Heart Cath and Coronary Angiography;  Surgeon: Lorretta Harp, MD;  Location: [place] CV LAB;  Service: Cardiovascular: Angiographically normal  coronary arteries. PCWP 33-36 mmHg, LVEDP 41 mmHg. PA pressure 60/35, mean 46 mmHg.  Cardiac output/cardiac index-3.93 /1.76 Kathlen Brunswick), 3.49/1.57 (thermodilution)   TEE WITHOUT CARDIOVERSION N/A 09/01/2019   Procedure: TRANSESOPHAGEAL ECHOCARDIOGRAM (TEE);  Surgeon: Larey Dresser, MD;  Location: [facility] ENDOSCOPY;  Service: Cardiovascular;  Laterality: N/A;   TOTAL HIP ARTHROPLASTY Left 03/27/2015   Procedure: LEFT TOTAL HIP ARTHROPLASTY ANTERIOR APPROACH;  Surgeon: Rod Can, MD;  Location: [place];  Service: Orthopedics;  Laterality: Left;   TOTAL HIP ARTHROPLASTY Right 05/03/2021   Procedure: TOTAL HIP ARTHROPLASTY ANTERIOR APPROACH;  Surgeon: Rod Can, MD;  [facility];  Service: Orthopedics;  Laterality: Right;   TOTAL SHOULDER ARTHROPLASTY  Right 11/24/2015   Procedure: RIGHT TOTAL SHOULDER ARTHROPLASTY;  Surgeon: Beverely Low, MD;  Location: [facility];  Service: Orthopedics;  Laterality: Right;   TOTAL SHOULDER ARTHROPLASTY Left 10/19/2019   Procedure: REVERSE TOTAL SHOULDER ARTHROPLASTY;  Surgeon: Jones Broom, MD;  [facility];  Service: Orthopedics;  Laterality: Left;   TRANSTHORACIC ECHOCARDIOGRAM  10/2016   EF 20-25%. Diffuse hypokinesis but akinesis of the entire inferoseptal wall and apical wall. Moderate biatrial enlargement. PA pressure estimated 64 mmHg.   WISDOM TOOTH EXTRACTION     Allergies  Allergen Reactions   Cymbalta [Duloxetine Hcl] Nausea Only and Other (See Comments)    Night sweats     Hydrocodone Rash      Objective:    Physical Exam Vitals and nursing note reviewed.  Constitutional:      General: He is not in acute distress.    Appearance: Normal appearance. He is obese.  HENT:     Head: Normocephalic.  Cardiovascular:     Rate and Rhythm: Normal rate and regular rhythm.  Pulmonary:     Effort: Pulmonary effort is normal.     Breath sounds: Normal breath sounds.  Musculoskeletal:        General: Normal range of motion.     Cervical back:  Normal range of motion.  Skin:    General: Skin is warm and dry.  Neurological:     Mental Status: He is alert and oriented to person, place, and time.  Psychiatric:        Mood and Affect: Mood normal.    BP (!) 96/52 (BP Location: Left Arm, Patient Position: Sitting, Cuff Size: Large)   Pulse 67   Temp 98.6 F (37 C) (Temporal)   Ht 5\' 11"  (1.803 m)   Wt 272 lb 9.6 oz (123.7 kg)   SpO2 98%   BMI 38.02 kg/m  Wt Readings from Last 3 Encounters:  06/17/22 272 lb 9.6 oz (123.7 kg)  05/17/22 273 lb 6.4 oz (124 kg)  04/24/22 270 lb 3.2 oz (122.6 kg)       06/24/22, NP

## 2022-06-18 ENCOUNTER — Encounter (HOSPITAL_BASED_OUTPATIENT_CLINIC_OR_DEPARTMENT_OTHER): Payer: Self-pay | Admitting: Physical Therapy

## 2022-06-18 ENCOUNTER — Ambulatory Visit (HOSPITAL_BASED_OUTPATIENT_CLINIC_OR_DEPARTMENT_OTHER): Payer: Commercial Managed Care - HMO | Attending: Orthopedic Surgery | Admitting: Physical Therapy

## 2022-06-18 DIAGNOSIS — M5459 Other low back pain: Secondary | ICD-10-CM | POA: Insufficient documentation

## 2022-06-18 DIAGNOSIS — R262 Difficulty in walking, not elsewhere classified: Secondary | ICD-10-CM | POA: Diagnosis present

## 2022-06-18 DIAGNOSIS — M6281 Muscle weakness (generalized): Secondary | ICD-10-CM | POA: Insufficient documentation

## 2022-06-18 MED ORDER — PREDNISONE 20 MG PO TABS: ORAL_TABLET | ORAL | 0 refills | Status: DC

## 2022-06-18 NOTE — Telephone Encounter (Signed)
See message below °

## 2022-06-18 NOTE — Therapy (Signed)
OUTPATIENT PHYSICAL THERAPY TREATMENT NOTE  Patient Name: John [name] MRN: [id number] DOB:Oct 24, 1960, 61 y.o., male Today's Date: 06/18/2022   PT End of Session - 06/18/22 1408     Visit Number 6    Number of Visits 22    Date for PT Re-Evaluation 06/26/22    Authorization Type [employer]    PT Start Time 1348    PT Stop Time 1400    PT Time Calculation (min) 12 min    Activity Tolerance Patient tolerated treatment well    Behavior During Therapy WFL for tasks assessed/performed                Past Medical History:  Diagnosis Date   AICD (automatic cardioverter/defibrillator) present    St. [employer]   Anxiety    Ativan   Chronic combined systolic and diastolic CHF, NYHA class 3 ([place]) 10/2016   Nonischemic cardiomyopathy. EF 20-25%.   Chronic left hip pain    Depression    history of   Dysrhythmia    A-FIB   GI bleed 03/2016   Headache    Hypertensive heart disease with combined systolic and diastolic congestive heart failure ([place]) 10/2016   Nonischemic cardiomyopathy ([place]) 10/2016   Echo with EF 20-25%. Cardiac catheterization with no CAD. LVEDP was 41 mmHg, PCWP 36 mmHg   Osteoarthritis    right shoulder   Right hand fracture    Seasonal allergies    Sleep apnea    wears a CPAP   Wears glasses    Past Surgical History:  Procedure Laterality Date   CARDIAC CATHETERIZATION     CARDIOVERSION N/A 09/01/2019   Procedure: CARDIOVERSION;  Surgeon: Larey Dresser, MD;  Location: [facility] ENDOSCOPY;  Service: Cardiovascular;  Laterality: N/A;   ESOPHAGOGASTRODUODENOSCOPY (EGD) WITH PROPOFOL N/A 03/20/2016   Procedure: ESOPHAGOGASTRODUODENOSCOPY (EGD) WITH PROPOFOL;  Surgeon: Wilford Corner, MD;  Location: [facility] ENDOSCOPY;  Service: Endoscopy;  Laterality: N/A;   ICD IMPLANT N/A 05/06/2018   Procedure: ICD IMPLANT;  Surgeon: Sanda Klein, MD;  Location: [place] CV LAB;  Service: Cardiovascular;  Laterality: N/A;   IRRIGATION AND DEBRIDEMENT SHOULDER Left 11/11/2019    Procedure: LEFT SHOULDER IRRIGATION AND DEBRIDEMENT WITH POLY EXCHANGE;  Surgeon: Tania Ade, MD;  Location: [facility] ORS;  Service: Orthopedics;  Laterality: Left;   KYPHOPLASTY N/A 02/22/2021   Procedure: KYPHOPLASTY T12;  Surgeon: Melina Schools, MD;  Location: [place];  Service: Orthopedics;  Laterality: N/A;  90 mins   ORIF WRIST FRACTURE Left 02/04/2022   Procedure: OPEN REDUCTION INTERNAL FIXATION (ORIF) WRIST FRACTURE;  Surgeon: Iran Planas, MD;  Location: [place];  Service: Orthopedics;  Laterality: Left;  with IV sedation   RIGHT/LEFT HEART CATH AND CORONARY ANGIOGRAPHY N/A 10/21/2016   Procedure: Right/Left Heart Cath and Coronary Angiography;  Surgeon: Lorretta Harp, MD;  Location: [place] [facility] LAB;  Service: Cardiovascular: Angiographically normal coronary arteries. PCWP 33-36 mmHg, LVEDP 41 mmHg. PA pressure 60/35, mean 46 mmHg.  Cardiac output/cardiac index-3.93 /1.76 Kathlen [place]), 3.49/1.57 (thermodilution)   TEE WITHOUT CARDIOVERSION N/A 09/01/2019   Procedure: TRANSESOPHAGEAL ECHOCARDIOGRAM (TEE);  Surgeon: Larey Dresser, MD;  Location: [facility] ENDOSCOPY;  Service: Cardiovascular;  Laterality: N/A;   TOTAL HIP ARTHROPLASTY Left 03/27/2015   Procedure: LEFT TOTAL HIP ARTHROPLASTY ANTERIOR APPROACH;  Surgeon: Rod Can, MD;  Location: [place];  Service: Orthopedics;  Laterality: Left;   TOTAL HIP ARTHROPLASTY Right 05/03/2021   Procedure: TOTAL HIP ARTHROPLASTY ANTERIOR APPROACH;  Surgeon: Rod Can, MD;  Location: [facility];  Service: Orthopedics;  Laterality: Right;   TOTAL SHOULDER ARTHROPLASTY Right 11/24/2015   Procedure: RIGHT TOTAL SHOULDER ARTHROPLASTY;  Surgeon: Netta Cedars, MD;  Location: [place];  Service: Orthopedics;  Laterality: Right;   TOTAL SHOULDER ARTHROPLASTY Left 10/19/2019   Procedure: REVERSE TOTAL SHOULDER ARTHROPLASTY;  Surgeon: Tania Ade, MD;  Location: [facility];  Service: Orthopedics;  Laterality: Left;   TRANSTHORACIC ECHOCARDIOGRAM  10/2016   EF 20-25%.  Diffuse hypokinesis but akinesis of the entire inferoseptal wall and apical wall. Moderate biatrial enlargement. PA pressure estimated 64 mmHg.   WISDOM TOOTH EXTRACTION     Patient Active Problem List   Diagnosis Date Noted   Closed fracture of distal end of left radius 02/01/2022   Lumbar spondylosis 01/16/2022   Persistent cough 08/27/2021   Acute conjunctivitis of right eye 08/27/2021   Osteoarthritis of right hip 05/03/2021   Osteoarthritis of left hip 05/03/2021   Aortic atherosclerosis ([place]) 03/22/2021   Thoracic compression fracture ([place]) 02/22/2021   Frequent falls 02/16/2021   Hyponatremia 02/16/2021   Acute anemia 02/16/2021   Hypotension 02/16/2021   ETOH abuse 02/16/2021   GAD (generalized anxiety disorder) 05/31/2020   Paroxysmal atrial fibrillation ([place]) 04/14/2020   S/P ICD (internal cardiac defibrillator) procedure 04/14/2020   Status post reverse total shoulder replacement, left 11/11/2019   Mild episode of recurrent major depressive disorder ([place]) 04/15/2019   Avascular necrosis of left humeral head ([place]) 11/20/2018   Retained orthopedic hardware 11/20/2018   Left shoulder pain 11/04/2018   Hyperlipidemia 05/27/2018   Unspecified fracture of upper end of left humerus, initial encounter for closed fracture 09/03/2017   Essential hypertension 11/08/2016   Non-ischemic cardiomyopathy ([facility])    Chronic combined systolic and diastolic CHF, NYHA class 3 ([place]) 10/2016   GI bleed 03/19/2016   Alcohol dependence in remission ([place]) 03/19/2016   S/P shoulder replacement 11/24/2015   Allergic rhinitis 08/21/2015   ED (erectile dysfunction) 08/21/2015   Hyperglycemia 08/21/2015   Insomnia 08/21/2015   OSA (obstructive sleep apnea) 08/21/2015   Degenerative joint disease of left hip 03/27/2015    PCP: Marin Olp, MD  REFERRING PROVIDER: Marin Olp, MD  REFERRING DIAG: [phone] (ICD-10-CM) - Encounter for other specified surgical aftercare  THERAPY DIAG:   Other low back pain  Muscle weakness (generalized)  Difficulty walking  ONSET DATE: 2022  SUBJECTIVE:                                                                                                                                                                                           SUBJECTIVE STATEMENT:  Pt states he has a dentist appt  at 2:15 and did not realize his PT appt was back to back. Pt states he just got an injection yesterday from the MD. It has not made a difference yet. His back is still hurting today and the injection hasn't taken effect.   EVAL Pt states that the hip feels much better at this time. His LBP continues to bother him. He had to cancel his last PT session(s) due to breaking the L wrist. Pt states he tripped in the parking lot and had a [place] type injury.  Pt was at home and tripped again and landed on the hand again. Pt had plates and screws into the L wrist.   His back pain is still bothering him with walking, bending, lifting, showering. Pt states the back pain is from the back and up. He does not have NT or radiating pain down the legs. He states that moving/walking makes it more painful. Pt is still sedentary for large parts of the day.  Walking causes pain immediately. Pt states the R hip doesn't bother him walking anymore. Pt will feeling of legs giving out occasionally but will not happen in both legs, feels more like pain that causes them to give out.    PERTINENT HISTORY:  Kyphoplasty, R THA, L THA, bilat TSA, L wrist ORIF (no precautions)   PAIN:  Are you having pain? Yes: NPRS scale: 5/10 Pain location: L/S on R  Pain description: shooting, aching, sharp pain Aggravating factors: standing in the shower, walking Relieving factors: tylenol    PRECAUTIONS: None  WEIGHT BEARING RESTRICTIONS No  FALLS:  Has patient fallen in last 6 months? yes  LIVING ENVIRONMENT: Lives with: lives with their family Lives in:  House/apartment  OCCUPATION: Desk job  PLOF: Independent with basic ADLs  PATIENT GOALS : Pt would like to get back to exercise, lose weight, and get rid pain.    OBJECTIVE:   PATIENT SURVEYS:  FOTO 34  D/C 47  9 pts MCII   TODAY'S TREATMENT   10/3  Trigger Point Dry-Needling  Treatment instructions: Expect mild to moderate muscle soreness. S/S of pneumothorax if dry needled over a lung field, and to seek immediate medical attention should they occur. Patient verbalized understanding of these instructions and education.   Patient Consent Given: Yes Education (verbally/handout)provided: Yes Muscles treated: R L/S multifidi L3-5 Electrical stimulation performed: N/A Treatment response/outcome: soft tissue extensibility improvement  R UPA grade III L3-5 STM R L3-5 paraspinals  9/28 Trigger Point Dry-Needling  Treatment instructions: Expect mild to moderate muscle soreness. S/S of pneumothorax if dry needled over a lung field, and to seek immediate medical attention should they occur. Patient verbalized understanding of these instructions and education.   Patient Consent Given: Yes Education (verbally/handout)provided: Yes Muscles treated: Bilat L/S multifidi L3-5 Electrical stimulation performed: N/A Treatment response/outcome: soft tissue extensibility improvement  UPA grade III L3-5 STM L3-5 paraspinals  Exercises - Supine Lower Trunk Rotation  - 2 x daily - 7 x weekly - 2 sets - 10 reps - 2 hold - Hooklying Single Knee to Chest Stretch  - 2 x daily - 7 x weekly - 2 sets - 10 reps - 5 hold - Standing Anti-Rotation Press with Anchored Resistance  - 1 x daily - 3-4 x weekly - 2 sets - 10 reps - Supine Bridge  - 1 x daily - 3-4 x weekly - 2 sets - 10 reps - Side Stepping with Resistance at Ankles  - 1 x daily - 3-4  x weekly - 1 sets - 3 reps - 51ft hold     PATIENT EDUCATION:  Education details: anatomy, exercise progression, DOMS expectations, muscle firing,   envelope of function, HEP, POC  Person educated: Patient Education method: Explanation, Demonstration, Tactile cues, Verbal cues, and Handouts Education comprehension: verbalized understanding, returned demonstration, verbal cues required, and tactile cues required   HOME EXERCISE PROGRAM:  Access Code: [email or web address] URL: [email or web address] Date: 01/08/2022 Prepared by: Daleen Bo  ASSESSMENT:  CLINICAL IMPRESSION: Pt session limited by scheduling conflict. Session used for pain management as pt has not had improvement in symptoms following injection. Pt advised that it may take 48-72 hours for it to take effect. Pt did have improvement in soft tissue tension following TPDN and manual therapy. However, pt's prone to standing transfer was very weak and required minA to standing. Pt still largely strength limited at this time. Plan to continue with strength and endurance based exercise as tolerated. Pt would benefit from continued skilled therapy in order to reach goals and maximize functional lumbar and hip strength and ROM for prevention of further functional decline.     OBJECTIVE IMPAIRMENTS Abnormal gait, decreased activity tolerance, decreased endurance, decreased mobility, difficulty walking, decreased ROM, decreased strength, hypomobility, increased fascial restrictions, increased muscle spasms, impaired flexibility, impaired sensation, improper body mechanics, postural dysfunction, and pain.   ACTIVITY LIMITATIONS cleaning, community activity, driving, occupation, yard work, shopping, and exercise .   PERSONAL FACTORS Age, Behavior pattern, Fitness, Past/current experiences, Time since onset of injury/illness/exacerbation, and 1-2 comorbidities:    are also affecting patient's functional outcome.    REHAB POTENTIAL: Fair    CLINICAL DECISION MAKING: Evolving/moderate complexity  EVALUATION COMPLEXITY: Moderate   GOALS:   SHORT TERM GOALS: Target date:  05/09/2022   Pt will become independent with HEP in order to demonstrate synthesis of PT education.   Goal status: INITIAL  2.  Pt will score at least 9 pt increase on FOTO to demonstrate functional improvement in MCII and pt perceived function.    Goal status: INITIAL  3.  Pt will be able to demonstrate STS without UE support  in order to demonstrate functional improvement in LE function for self-care and house hold duties.   Goal status: INITIAL  LONG TERM GOALS: Target date: 06/20/2022   Pt  will become independent with final HEP in order to demonstrate synthesis of PT education.   Goal status: INITIAL  2.  Pt will score >/= 47 on FOTO to demonstrate improvement in perceived R hip and L/S function.   Goal status: INITIAL  3.  Pt will be able to perform 5XSTS in under 12s  in order to demonstrate functional improvement above the cut off score for adults.    Goal status: INITIAL  4.  Pt will be able to demonstrate/report ability to walk >15 mins with or without pain in order to demonstrate functional improvement and tolerance to exercise and community mobility.   Goal status: INITIAL  5. Pt will be able to demonstrate ability to swing a golf club 75% without pain in order to demonstrate functional improvement in lumbar pain for return to exercise and recreation.    Goal status: INITIAL   PLAN: PT FREQUENCY: 1-2x/week  PT DURATION: 12 weeks (likely D/C by 10wks)  PLANNED INTERVENTIONS: Therapeutic exercises, Therapeutic activity, Neuromuscular re-education, Balance training, Gait training, Patient/Family education, Joint manipulation, Joint mobilization, Stair training, Orthotic/Fit training, DME instructions, Aquatic Therapy, Dry Needling, Electrical stimulation, Spinal manipulation, Spinal mobilization, Cryotherapy, Moist heat,  scar mobilization, Taping, Vasopneumatic device, Traction, Ultrasound, Ionotophoresis 4mg /ml Dexamethasone, and Manual therapy.  PLAN FOR  NEXT SESSION:  L/S stretching, bilat hip stretching, lumbopelvic strength, endurance, develop aquatic HEP for home pool   Daleen Bo, PT 06/18/2022, 2:14 PM

## 2022-06-18 NOTE — Addendum Note (Signed)
Addended byJeanie Sewer on: 06/18/2022 06:31 PM   Modules accepted: Orders

## 2022-06-19 ENCOUNTER — Encounter: Payer: Self-pay | Admitting: *Deleted

## 2022-06-19 ENCOUNTER — Encounter (INDEPENDENT_AMBULATORY_CARE_PROVIDER_SITE_OTHER): Payer: Commercial Managed Care - HMO | Admitting: Family Medicine

## 2022-06-19 NOTE — Telephone Encounter (Signed)
Tried calling patient, unable to leave a message, mailbox full. 

## 2022-06-19 NOTE — Telephone Encounter (Signed)
Patient notified

## 2022-06-21 ENCOUNTER — Telehealth: Payer: Self-pay | Admitting: Family Medicine

## 2022-06-21 DIAGNOSIS — R053 Chronic cough: Secondary | ICD-10-CM

## 2022-06-21 MED ORDER — AZITHROMYCIN 250 MG PO TABS: ORAL_TABLET | ORAL | 0 refills | Status: AC

## 2022-06-21 NOTE — Telephone Encounter (Signed)
I called pt to let him know John [name] will be sending him in a antibiotic for his cough. Pt was very angry when I explained to him John [name] wanted him to start Prednisone on Wednesday and not Tuesday.

## 2022-06-21 NOTE — Telephone Encounter (Signed)
Patient requests to be called at ph# [phone] regarding:  Patient states his cough has not gotten better and that at office visit he was told to call by today if cough did not improve and would be prescribed an Antibiotic.  Patient requests to be advised if it is okay to take Robitussin with his other medications.  Pharmacy for RX for Antibiotic is: [id number] - [place], [place] Phone: [phone]  Fax: [phone]

## 2022-06-21 NOTE — Addendum Note (Signed)
Addended byJeanie Sewer on: 06/21/2022 05:50 PM   Modules accepted: Orders

## 2022-06-21 NOTE — Telephone Encounter (Signed)
Better for him to take generic Mucinex to help the cough. I sent a prednisone refill for his cough to start on Wednesday. Is he taking this?

## 2022-06-24 ENCOUNTER — Ambulatory Visit (INDEPENDENT_AMBULATORY_CARE_PROVIDER_SITE_OTHER): Payer: Commercial Managed Care - HMO | Admitting: Family Medicine

## 2022-06-24 ENCOUNTER — Encounter (HOSPITAL_BASED_OUTPATIENT_CLINIC_OR_DEPARTMENT_OTHER): Payer: Self-pay | Admitting: Physical Therapy

## 2022-06-24 ENCOUNTER — Ambulatory Visit (HOSPITAL_BASED_OUTPATIENT_CLINIC_OR_DEPARTMENT_OTHER): Payer: Commercial Managed Care - HMO | Admitting: Physical Therapy

## 2022-06-24 DIAGNOSIS — M5459 Other low back pain: Secondary | ICD-10-CM | POA: Diagnosis not present

## 2022-06-24 DIAGNOSIS — M6281 Muscle weakness (generalized): Secondary | ICD-10-CM

## 2022-06-24 DIAGNOSIS — R262 Difficulty in walking, not elsewhere classified: Secondary | ICD-10-CM

## 2022-06-24 NOTE — Therapy (Addendum)
OUTPATIENT PHYSICAL THERAPY TREATMENT NOTE  PHYSICAL THERAPY DISCHARGE SUMMARY  Visits from Start of Care: 7  Plan: Patient agrees to discharge.  Patient goals were not met. Patient is being discharged due to not returning to therapy following other medical management.      Patient Name: John [name] MRN: [id number] DOB:11/09/1960, 61 y.o., male Today's Date: 06/24/2022   PT End of Session - 06/24/22 1323     Visit Number 7    Number of Visits 22    Date for PT Re-Evaluation 06/26/22    Authorization Type [employer]    PT Start Time 1300    PT Stop Time 1340    PT Time Calculation (min) 40 min    Activity Tolerance Patient tolerated treatment well    Behavior During Therapy WFL for tasks assessed/performed                 Past Medical History:  Diagnosis Date   AICD (automatic cardioverter/defibrillator) present    [employer]   Anxiety    Ativan   Chronic combined systolic and diastolic CHF, NYHA class 3 ([place]) 10/2016   Nonischemic cardiomyopathy. EF 20-25%.   Chronic left hip pain    Depression    history of   Dysrhythmia    A-FIB   GI bleed 03/2016   Headache    Hypertensive heart disease with combined systolic and diastolic congestive heart failure ([place]) 10/2016   Nonischemic cardiomyopathy ([place]) 10/2016   Echo with EF 20-25%. Cardiac catheterization with no CAD. LVEDP was 41 mmHg, PCWP 36 mmHg   Osteoarthritis    right shoulder   Right hand fracture    Seasonal allergies    Sleep apnea    wears a CPAP   Wears glasses    Past Surgical History:  Procedure Laterality Date   CARDIAC CATHETERIZATION     CARDIOVERSION N/A 09/01/2019   Procedure: CARDIOVERSION;  Surgeon: Larey Dresser, MD;  Location: [facility] ENDOSCOPY;  Service: Cardiovascular;  Laterality: N/A;   ESOPHAGOGASTRODUODENOSCOPY (EGD) WITH PROPOFOL N/A 03/20/2016   Procedure: ESOPHAGOGASTRODUODENOSCOPY (EGD) WITH PROPOFOL;  Surgeon: Wilford Corner, MD;  Location: [facility] ENDOSCOPY;  Service:  Endoscopy;  Laterality: N/A;   ICD IMPLANT N/A 05/06/2018   Procedure: ICD IMPLANT;  Surgeon: Sanda Klein, MD;  Location: [place] CV LAB;  Service: Cardiovascular;  Laterality: N/A;   IRRIGATION AND DEBRIDEMENT SHOULDER Left 11/11/2019   Procedure: LEFT SHOULDER IRRIGATION AND DEBRIDEMENT WITH POLY EXCHANGE;  Surgeon: Tania Ade, MD;  Location: [facility] ORS;  Service: Orthopedics;  Laterality: Left;   KYPHOPLASTY N/A 02/22/2021   Procedure: KYPHOPLASTY T12;  Surgeon: Melina Schools, MD;  Location: [place];  Service: Orthopedics;  Laterality: N/A;  90 mins   ORIF WRIST FRACTURE Left 02/04/2022   Procedure: OPEN REDUCTION INTERNAL FIXATION (ORIF) WRIST FRACTURE;  Surgeon: Iran Planas, MD;  Location: [place];  Service: Orthopedics;  Laterality: Left;  with IV sedation   RIGHT/LEFT HEART CATH AND CORONARY ANGIOGRAPHY N/A 10/21/2016   Procedure: Right/Left Heart Cath and Coronary Angiography;  Surgeon: Lorretta Harp, MD;  Location: [place] CV LAB;  Service: Cardiovascular: Angiographically normal coronary arteries. PCWP 33-36 mmHg, LVEDP 41 mmHg. PA pressure 60/35, mean 46 mmHg.  Cardiac output/cardiac index-3.93 /1.76 Kathlen [place]), 3.49/1.57 (thermodilution)   TEE WITHOUT CARDIOVERSION N/A 09/01/2019   Procedure: TRANSESOPHAGEAL ECHOCARDIOGRAM (TEE);  Surgeon: Larey Dresser, MD;  Location: [facility] ENDOSCOPY;  Service: Cardiovascular;  Laterality: N/A;   TOTAL HIP ARTHROPLASTY Left 03/27/2015   Procedure: LEFT TOTAL HIP  ARTHROPLASTY ANTERIOR APPROACH;  Surgeon: Rod Can, MD;  Location: [place];  Service: Orthopedics;  Laterality: Left;   TOTAL HIP ARTHROPLASTY Right 05/03/2021   Procedure: TOTAL HIP ARTHROPLASTY ANTERIOR APPROACH;  Surgeon: Rod Can, MD;  Location: [facility];  Service: Orthopedics;  Laterality: Right;   TOTAL SHOULDER ARTHROPLASTY Right 11/24/2015   Procedure: RIGHT TOTAL SHOULDER ARTHROPLASTY;  Surgeon: Netta Cedars, MD;  Location: [place];  Service: Orthopedics;  Laterality: Right;    TOTAL SHOULDER ARTHROPLASTY Left 10/19/2019   Procedure: REVERSE TOTAL SHOULDER ARTHROPLASTY;  Surgeon: Tania Ade, MD;  Location: [facility];  Service: Orthopedics;  Laterality: Left;   TRANSTHORACIC ECHOCARDIOGRAM  10/2016   EF 20-25%. Diffuse hypokinesis but akinesis of the entire inferoseptal wall and apical wall. Moderate biatrial enlargement. PA pressure estimated 64 mmHg.   WISDOM TOOTH EXTRACTION     Patient Active Problem List   Diagnosis Date Noted   Closed fracture of distal end of left radius 02/01/2022   Lumbar spondylosis 01/16/2022   Persistent cough 08/27/2021   Acute conjunctivitis of right eye 08/27/2021   Osteoarthritis of right hip 05/03/2021   Osteoarthritis of left hip 05/03/2021   Aortic atherosclerosis ([place]) 03/22/2021   Thoracic compression fracture ([place]) 02/22/2021   Frequent falls 02/16/2021   Hyponatremia 02/16/2021   Acute anemia 02/16/2021   Hypotension 02/16/2021   ETOH abuse 02/16/2021   GAD (generalized anxiety disorder) 05/31/2020   Paroxysmal atrial fibrillation ([place]) 04/14/2020   S/P ICD (internal cardiac defibrillator) procedure 04/14/2020   Status post reverse total shoulder replacement, left 11/11/2019   Mild episode of recurrent major depressive disorder ([place]) 04/15/2019   Avascular necrosis of left humeral head ([place]) 11/20/2018   Retained orthopedic hardware 11/20/2018   Left shoulder pain 11/04/2018   Hyperlipidemia 05/27/2018   Unspecified fracture of upper end of left humerus, initial encounter for closed fracture 09/03/2017   Essential hypertension 11/08/2016   Non-ischemic cardiomyopathy ([facility])    Chronic combined systolic and diastolic CHF, NYHA class 3 ([place]) 10/2016   GI bleed 03/19/2016   Alcohol dependence in remission ([place]) 03/19/2016   S/P shoulder replacement 11/24/2015   Allergic rhinitis 08/21/2015   ED (erectile dysfunction) 08/21/2015   Hyperglycemia 08/21/2015   Insomnia 08/21/2015   OSA (obstructive sleep apnea)  08/21/2015   Degenerative joint disease of left hip 03/27/2015    PCP: Marin Olp, MD  REFERRING PROVIDER: Marin Olp, MD  REFERRING DIAG: [phone] (ICD-10-CM) - Encounter for other specified surgical aftercare  THERAPY DIAG:  Other low back pain  Muscle weakness (generalized)  Difficulty walking  ONSET DATE: 2022  SUBJECTIVE:  SUBJECTIVE STATEMENT:  Pt states he got an injection, prednisone. 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 About .25 miles. He still feels winded with exercise.    EVAL Pt states that the hip feels much better at this time. His LBP continues to bother him. He had to cancel his last PT session(s) due to breaking the L wrist. Pt states he tripped in the parking lot and had a Rayne type injury.  Pt was at home and tripped again and landed on the hand again. Pt had plates and screws into the L wrist.   His back pain is still bothering him with walking, bending, lifting, showering. Pt states the back pain is from the back and up. He does not have NT or radiating pain down the legs. He states that moving/walking makes it more painful. Pt is still sedentary for large parts of the day.  Walking causes pain immediately. Pt states the R hip doesn't bother him walking anymore. Pt will feeling of legs giving out occasionally but will not happen in both legs, feels more like pain that causes them to give out.    PERTINENT HISTORY:  Kyphoplasty, R THA, L THA, bilat TSA, L wrist ORIF (no precautions)   PAIN:  Are you having pain? Yes: NPRS scale: 5/10 Pain location: L/S on R  Pain description: shooting, aching, sharp pain Aggravating factors: standing in the shower, walking Relieving factors: tylenol     PRECAUTIONS: None  WEIGHT BEARING RESTRICTIONS No  FALLS:  Has patient fallen in last 6 months? yes  LIVING ENVIRONMENT: Lives with: lives with their family Lives in: House/apartment  OCCUPATION: Desk job  PLOF: Independent with basic ADLs  PATIENT GOALS : Pt would like to get back to exercise, lose weight, and get rid pain.    OBJECTIVE:   PATIENT SURVEYS:  FOTO 34  D/C 47  9 pts MCII   TODAY'S TREATMENT   10/9   Trigger Point Dry-Needling  Treatment instructions: Expect mild to moderate muscle soreness. S/S of pneumothorax if dry needled over a lung field, and to seek immediate medical attention should they occur. Patient verbalized understanding of these instructions and education.   Patient Consent Given: Yes Education (verbally/handout)provided: Yes Muscles treated: R L/S multifidi L3-5 Electrical stimulation performed: N/A Treatment response/outcome: soft tissue extensibility improvement  Bilat UPA grade III L3-5 STM R L3-5 paraspinals  Exercises - Supine Lower Trunk Rotation  - 2 x daily - 7 x weekly - 2 sets - 10 reps - 2 hold - Supine Bridge  - 1 x daily - 3-4 x weekly - 2 sets - 10 reps - STS from table 3x8 with hip ABD blue band -supine curl up 2s 10x  10/3  Trigger Point Dry-Needling  Treatment instructions: Expect mild to moderate muscle soreness. S/S of pneumothorax if dry needled over a lung field, and to seek immediate medical attention should they occur. Patient verbalized understanding of these instructions and education.   Patient Consent Given: Yes Education (verbally/handout)provided: Yes Muscles treated: R L/S multifidi L3-5 Electrical stimulation performed: N/A Treatment response/outcome: soft tissue extensibility improvement  R UPA grade III L3-5 STM R L3-5 paraspinals  9/28 Trigger Point Dry-Needling  Treatment instructions: Expect mild to moderate muscle soreness. S/S of pneumothorax if dry needled over a lung field, and  to seek immediate medical attention should they occur. Patient verbalized understanding of these instructions and education.   Patient Consent  Given: Yes Education (verbally/handout)provided: Yes Muscles treated: Bilat L/S multifidi L3-5 Electrical stimulation performed: N/A Treatment response/outcome: soft tissue extensibility improvement  UPA grade III L3-5 STM L3-5 paraspinals  Exercises - Supine Lower Trunk Rotation  - 2 x daily - 7 x weekly - 2 sets - 10 reps - 2 hold - Hooklying Single Knee to Chest Stretch  - 2 x daily - 7 x weekly - 2 sets - 10 reps - 5 hold - Standing Anti-Rotation Press with Anchored Resistance  - 1 x daily - 3-4 x weekly - 2 sets - 10 reps - Supine Bridge  - 1 x daily - 3-4 x weekly - 2 sets - 10 reps - Side Stepping with Resistance at Ankles  - 1 x daily - 3-4 x weekly - 1 sets - 3 reps - 42f hold     PATIENT EDUCATION:  Education details: anatomy, exercise progression, DOMS expectations, muscle firing,  envelope of function, HEP, POC  Person educated: Patient Education method: Explanation, Demonstration, Tactile cues, Verbal cues, and Handouts Education comprehension: verbalized understanding, returned demonstration, verbal cues required, and tactile cues required   HOME EXERCISE PROGRAM:  Access Code: [email or web address] URL: [email or web address] Date: 01/08/2022 Prepared by: ADaleen Bo ASSESSMENT:  CLINICAL IMPRESSION: Pt able to progress with lumbopelvic strengthening at today's session without increase in pain. Pt's pain appears better managed with recent injection and secondary benefit of prednisone on MSK pain (originally for cough). Pt able to perform standing based exercised today for the hip and LE as well as abdominal bracing exercise. HEP updated at this time. Plan to continue with land based sessions as tolerated. Consider seated cable rowing at next session as well as LE warm up as pt has less lumbar soft tissue hypertonicity and  pain at rest. Pt would benefit from continued skilled therapy in order to reach goals and maximize functional lumbar and hip strength and ROM for prevention of further functional decline.     OBJECTIVE IMPAIRMENTS Abnormal gait, decreased activity tolerance, decreased endurance, decreased mobility, difficulty walking, decreased ROM, decreased strength, hypomobility, increased fascial restrictions, increased muscle spasms, impaired flexibility, impaired sensation, improper body mechanics, postural dysfunction, and pain.   ACTIVITY LIMITATIONS cleaning, community activity, driving, occupation, yard work, shopping, and exercise .   PERSONAL FACTORS Age, Behavior pattern, Fitness, Past/current experiences, Time since onset of injury/illness/exacerbation, and 1-2 comorbidities:    are also affecting patient's functional outcome.    REHAB POTENTIAL: Fair    CLINICAL DECISION MAKING: Evolving/moderate complexity  EVALUATION COMPLEXITY: Moderate   GOALS:   SHORT TERM GOALS: Target date: 05/09/2022   Pt will become independent with HEP in order to demonstrate synthesis of PT education.   Goal status: INITIAL  2.  Pt will score at least 9 pt increase on FOTO to demonstrate functional improvement in MCII and pt perceived function.    Goal status: INITIAL  3.  Pt will be able to demonstrate STS without UE support  in order to demonstrate functional improvement in LE function for self-care and house hold duties.   Goal status: INITIAL  LONG TERM GOALS: Target date: 06/20/2022   Pt  will become independent with final HEP in order to demonstrate synthesis of PT education.   Goal status: INITIAL  2.  Pt will score >/= 47 on FOTO to demonstrate improvement in perceived R hip and L/S function.   Goal status: INITIAL  3.  Pt will be able to perform 5XSTS in under 12s  in order to  demonstrate functional improvement above the cut off score for adults.    Goal status: INITIAL  4.  Pt  will be able to demonstrate/report ability to walk >15 mins with or without pain in order to demonstrate functional improvement and tolerance to exercise and community mobility.   Goal status: INITIAL  5. Pt will be able to demonstrate ability to swing a golf club 75% without pain in order to demonstrate functional improvement in lumbar pain for return to exercise and recreation.    Goal status: INITIAL   PLAN: PT FREQUENCY: 1-2x/week  PT DURATION: 12 weeks (likely D/C by 10wks)  PLANNED INTERVENTIONS: Therapeutic exercises, Therapeutic activity, Neuromuscular re-education, Balance training, Gait training, Patient/Family education, Joint manipulation, Joint mobilization, Stair training, Orthotic/Fit training, DME instructions, Aquatic Therapy, Dry Needling, Electrical stimulation, Spinal manipulation, Spinal mobilization, Cryotherapy, Moist heat, scar mobilization, Taping, Vasopneumatic device, Traction, Ultrasound, Ionotophoresis '4mg'$ /ml Dexamethasone, and Manual therapy.  PLAN FOR NEXT SESSION:  L/S stretching, bilat hip stretching, lumbopelvic strength, endurance, develop aquatic HEP for home pool   Daleen Bo, PT 06/24/2022, 1:54 PM   PHYSICAL THERAPY DISCHARGE SUMMARY  Visits from Start of Care: 7  Current functional level related to goals / functional outcomes: See above   Remaining deficits: See above   Education / Equipment: Anatomy of condition, POC, HEP, exercise form/rationale    Patient agrees to discharge. Patient goals were not met. Patient is being discharged due to not returning since the last visit. Jessica C. Hightower PT, DPT 11/07/22 11:31 AM

## 2022-06-24 NOTE — Telephone Encounter (Signed)
Called to schedule referral, MB full

## 2022-06-26 ENCOUNTER — Emergency Department (HOSPITAL_BASED_OUTPATIENT_CLINIC_OR_DEPARTMENT_OTHER): Payer: Commercial Managed Care - HMO

## 2022-06-26 ENCOUNTER — Encounter (INDEPENDENT_AMBULATORY_CARE_PROVIDER_SITE_OTHER): Payer: Commercial Managed Care - HMO | Admitting: Family Medicine

## 2022-06-26 ENCOUNTER — Emergency Department (HOSPITAL_BASED_OUTPATIENT_CLINIC_OR_DEPARTMENT_OTHER)
Admission: EM | Admit: 2022-06-26 | Discharge: 2022-06-26 | Disposition: A | Discharge status: Home / Self Care | Payer: Commercial Managed Care - HMO | Attending: Emergency Medicine | Admitting: Emergency Medicine

## 2022-06-26 ENCOUNTER — Encounter (HOSPITAL_BASED_OUTPATIENT_CLINIC_OR_DEPARTMENT_OTHER): Payer: Self-pay

## 2022-06-26 ENCOUNTER — Other Ambulatory Visit: Payer: Self-pay

## 2022-06-26 ENCOUNTER — Emergency Department (HOSPITAL_BASED_OUTPATIENT_CLINIC_OR_DEPARTMENT_OTHER): Payer: Commercial Managed Care - HMO | Admitting: Radiology

## 2022-06-26 DIAGNOSIS — W01198A Fall on same level from slipping, tripping and stumbling with subsequent striking against other object, initial encounter: Secondary | ICD-10-CM | POA: Diagnosis not present

## 2022-06-26 DIAGNOSIS — S299XXA Unspecified injury of thorax, initial encounter: Secondary | ICD-10-CM | POA: Diagnosis present

## 2022-06-26 DIAGNOSIS — Z7901 Long term (current) use of anticoagulants: Secondary | ICD-10-CM | POA: Diagnosis not present

## 2022-06-26 DIAGNOSIS — Z79899 Other long term (current) drug therapy: Secondary | ICD-10-CM | POA: Diagnosis not present

## 2022-06-26 DIAGNOSIS — S2243XA Multiple fractures of ribs, bilateral, initial encounter for closed fracture: Secondary | ICD-10-CM | POA: Insufficient documentation

## 2022-06-26 LAB — COMPREHENSIVE METABOLIC PANEL
ALT: 55 U/L — ABNORMAL HIGH (ref 0–44)
AST: 38 U/L (ref 15–41)
Albumin: 4.3 g/dL (ref 3.5–5.0)
Alkaline Phosphatase: 50 U/L (ref 38–126)
Anion gap: 10 (ref 5–15)
BUN: 34 mg/dL — ABNORMAL HIGH (ref 8–23)
CO2: 30 mmol/L (ref 22–32)
Calcium: 9.4 mg/dL (ref 8.9–10.3)
Chloride: 90 mmol/L — ABNORMAL LOW (ref 98–111)
Creatinine, Ser: 1.26 mg/dL — ABNORMAL HIGH (ref 0.61–1.24)
GFR, Estimated: >60 mL/min (ref >60)
Glucose, Bld: 119 mg/dL — ABNORMAL HIGH (ref 70–99)
Potassium: 3.6 mmol/L (ref 3.5–5.1)
Sodium: 130 mmol/L — ABNORMAL LOW (ref 135–145)
Total Bilirubin: 0.4 mg/dL (ref 0.3–1.2)
Total Protein: 7.3 g/dL (ref 6.5–8.1)

## 2022-06-26 LAB — CBC
HCT: 35.1 % — ABNORMAL LOW (ref 39.0–52.0)
Hemoglobin: 11.7 g/dL — ABNORMAL LOW (ref 13.0–17.0)
MCH: 32 pg (ref 26.0–34.0)
MCHC: 33.3 g/dL (ref 30.0–36.0)
MCV: 95.9 fL (ref 80.0–100.0)
Platelets: 247 10*3/uL (ref 150–400)
RBC: 3.66 MIL/uL — ABNORMAL LOW (ref 4.22–5.81)
RDW: 13.1 % (ref 11.5–15.5)
WBC: 13.2 10*3/uL — ABNORMAL HIGH (ref 4.0–10.5)
nRBC: 0 % (ref 0.0–0.2)

## 2022-06-26 MED ORDER — HYDROMORPHONE HCL 1 MG/ML IJ SOLN
1.0000 mg | Freq: Once | INTRAMUSCULAR | Status: AC
  Administered 2022-06-26: 1 mg via INTRAVENOUS
  Filled 2022-06-26: qty 1

## 2022-06-26 MED ORDER — OXYCODONE-ACETAMINOPHEN 5-325 MG PO TABS: 1.0000 | ORAL_TABLET | ORAL | 0 refills | Status: DC | PRN

## 2022-06-26 MED ORDER — OXYCODONE-ACETAMINOPHEN 5-325 MG PO TABS
1.0000 | ORAL_TABLET | Freq: Once | ORAL | Status: AC
  Administered 2022-06-26: 1 via ORAL
  Filled 2022-06-26: qty 1

## 2022-06-26 MED ORDER — IOHEXOL 300 MG/ML  SOLN
100.0000 mL | Freq: Once | INTRAMUSCULAR | Status: AC | PRN
  Administered 2022-06-26: 100 mL via INTRAVENOUS

## 2022-06-26 MED ORDER — ONDANSETRON HCL 4 MG/2ML IJ SOLN
4.0000 mg | Freq: Once | INTRAMUSCULAR | Status: AC
  Administered 2022-06-26: 4 mg via INTRAVENOUS
  Filled 2022-06-26: qty 2

## 2022-06-26 NOTE — ED Notes (Signed)
Pt fell last night at a restaurant, pt is on blood thinners and has bruising noted to chin.  Pt c/o pain in rib areas, states he can not sit in bed or cough  Pain 10/10

## 2022-06-26 NOTE — Discharge Instructions (Addendum)
Follow up with your Physician for recheck.  Radiologist has advised follow up liver evaluation

## 2022-06-26 NOTE — ED Notes (Signed)
Attempted IV x 2 unsuccessful.

## 2022-06-26 NOTE — ED Triage Notes (Signed)
Patient here POV from Home.  Endorses Fall Last PM. Tripped while Stepping into a Restaurant. Fell onto [employer]. No LOC. Laceration to Chin with Associated Pain. Also endorses Bilateral Rib Cage Pain. Takes Eliquis.  NAD Noted during Triage. A&Ox4. GCS 15. Ambulatory.

## 2022-06-26 NOTE — ED Provider Notes (Signed)
[facility] EMERGENCY DEPT Provider Note   CSN: [id number] Arrival date & time: 06/26/22  1604     History  Chief Complaint  Patient presents with   John [name] is a 61 y.o. male.  Patient reports he tripped over a curb coming out of a restaurant last p.m. he struck his head on the ground.  Patient complains of severe pain in his right chest.  Patient reports he feels like he has broken ribs.  Patient reports he has had rib fractures in the past and this feels the same.  Patient denies any loss of consciousness he denies any impact to his head patient denies any extremity pain he did not lose consciousness.  Patient complains of pain when taking a deep breath.  The history is provided by the patient. No language interpreter was used.  Fall This is a new problem. The current episode started yesterday. The problem occurs constantly. The problem has been gradually worsening. Associated symptoms include chest pain. Nothing aggravates the symptoms. Nothing relieves the symptoms. He has tried nothing for the symptoms.       Home Medications Prior to Admission medications   Medication Sig Start Date End Date Taking? Authorizing Provider  oxyCODONE-acetaminophen (PERCOCET) 5-325 MG tablet Take 1 tablet by mouth every 4 (four) hours as needed for severe pain. 06/26/22 06/26/23 Yes Elson Areas, PA-C  acetaminophen (TYLENOL) 325 MG tablet Take 1-2 tablets (325-650 mg total) by mouth every 6 (six) hours as needed for mild pain (pain score 1-3 or temp > 100.5). 05/04/21   Darrick Grinder, PA-C  azithromycin (ZITHROMAX) 250 MG tablet Take 2 tablets on day 1, then 1 tablet daily on days 2 through 5 06/21/22 06/26/22  Dulce Sellar, NP  carvedilol (COREG) 25 MG tablet Take 25 mg by mouth 2 (two) times daily. 09/08/21   [provider]  diphenhydramine-acetaminophen (TYLENOL PM) 25-500 MG TABS tablet Take 2 tablets by mouth at bedtime.    [provider]  docusate sodium (COLACE) 100 MG capsule Take 1 capsule (100 mg total) by mouth 2 (two) times daily. 02/04/22   Bradly Bienenstock, MD  ELIQUIS 5 MG TABS tablet TAKE 1 TABLET BY MOUTH TWICE A DAY 05/17/22   Laurey Morale, MD  ENTRESTO 97-103 MG TAKE 1 TABLET BY MOUTH TWICE A DAY 09/18/21   Laurey Morale, MD  escitalopram (LEXAPRO) 10 MG tablet TAKE 1 TABLET BY MOUTH EVERY DAY 04/08/22   Shelva Majestic, MD  FARXIGA 10 MG TABS tablet TAKE 1 TABLET BY MOUTH EVERY DAY BEFORE BREAKFAST 03/08/22   Laurey Morale, MD  fluticasone [facility]) 50 MCG/ACT nasal spray Place 1 spray into both nostrils daily. Use twice a day until symptoms under control, then reduce to daily use. 12/26/21   Dulce Sellar, NP  hydrOXYzine (ATARAX) 25 MG tablet Take 25 mg by mouth as needed.    [provider]  ketoconazole (NIZORAL) 2 % cream Apply 1 Application topically daily. To all rash areas- also pending dermatology visit. Use for at least 2 weeks and at least a week after resolution. 05/17/22   Shelva Majestic, MD  methocarbamol (ROBAXIN) 500 MG tablet Take 1-2 tablets (500-1,000 mg total) by mouth 4 (four) times daily. 06/17/22   Dulce Sellar, NP  predniSONE (DELTASONE) 20 MG tablet Take 2 pills in the morning for 3 days, 1 pill every morning for 4 days 06/19/22   Dulce Sellar, NP  rosuvastatin (CRESTOR) 20  MG tablet TAKE 1 TABLET BY MOUTH EVERY DAY 07/30/21   Larey Dresser, MD  sertraline (ZOLOFT) 100 MG tablet Take 100 mg by mouth daily. 12/20/21   [provider]  sildenafil (VIAGRA) 100 MG tablet TAKE 0.5 TABLETS (50 MG TOTAL) BY MOUTH AT BEDTIME AS NEEDED FOR ERECTILE DYSFUNCTION. 07/30/21   Larey Dresser, MD  spironolactone (ALDACTONE) 50 MG tablet Take 1 tablet (50 mg total) by mouth daily. 01/22/22   Larey Dresser, MD  torsemide (DEMADEX) 20 MG tablet TAKE 3 TABLETS BY MOUTH EVERY DAY 06/10/22   Larey Dresser, MD  traZODone (DESYREL) 50 MG tablet TAKE 1 TABLET BY  MOUTH EVERY DAY AT BEDTIME AS NEEDED 06/03/22   Marin Olp, MD      Allergies    Cymbalta [duloxetine hcl] and Hydrocodone    Review of Systems   Review of Systems  Cardiovascular:  Positive for chest pain.  All other systems reviewed and are negative.   Physical Exam Updated Vital Signs BP 115/70   Pulse 60   Temp 98 F (36.7 C) (Oral)   Resp 18   Ht 5\' 11"  (1.803 m)   Wt 123.7 kg   SpO2 97%   BMI 38.04 kg/m  Physical Exam Vitals and nursing note reviewed.  Constitutional:      Appearance: He is well-developed.  HENT:     Head: Normocephalic.     Comments: 4 x 6 cm hematoma chin    Nose: Nose normal.     Mouth/Throat:     Mouth: Mucous membranes are moist.  Eyes:     Pupils: Pupils are equal, round, and reactive to light.  Cardiovascular:     Rate and Rhythm: Normal rate and regular rhythm.  Pulmonary:     Effort: Pulmonary effort is normal.     Breath sounds: Normal breath sounds.     Comments: Tender right anterior lower ribs right side Chest:     Chest wall: Tenderness present.  Abdominal:     General: There is no distension.     Tenderness: There is abdominal tenderness.     Comments: Tender right upper quadrant abdomen bruising right upper abdomen  Musculoskeletal:        General: Normal range of motion.     Cervical back: Normal range of motion.  Skin:    General: Skin is warm.  Neurological:     General: No focal deficit present.     Mental Status: He is alert and oriented to person, place, and time.  Psychiatric:        Mood and Affect: Mood normal.     ED Results / Procedures / Treatments   Labs (all labs ordered are listed, but only abnormal results are displayed) Labs Reviewed  COMPREHENSIVE METABOLIC PANEL - Abnormal; Notable for the following components:      Result Value   Sodium 130 (*)    Chloride 90 (*)    Glucose, Bld 119 (*)    BUN 34 (*)    Creatinine, Ser 1.26 (*)    ALT 55 (*)    All other components within normal  limits  CBC - Abnormal; Notable for the following components:   WBC 13.2 (*)    RBC 3.66 (*)    Hemoglobin 11.7 (*)    HCT 35.1 (*)    All other components within normal limits    EKG None  Radiology CT HEAD WO CONTRAST  Result Date: 06/26/2022 CLINICAL DATA:  Head trauma EXAM: CT HEAD WITHOUT CONTRAST TECHNIQUE: Contiguous axial images were obtained from the base of the skull through the vertex without intravenous contrast. RADIATION DOSE REDUCTION: This exam was performed according to the departmental dose-optimization program which includes automated exposure control, adjustment of the mA and/or kV according to patient size and/or use of iterative reconstruction technique. COMPARISON:  CT brain 01/30/2022 FINDINGS: Brain: No acute territorial infarction, hemorrhage or intracranial mass. Mild atrophy. Nonenlarged ventricles. Vascular: No hyperdense vessels.  Carotid vascular calcification Skull: Normal. Negative for fracture or focal lesion. Sinuses/Orbits: No acute finding. Other: None IMPRESSION: Negative.  No CT evidence for acute intracranial abnormality Electronically Signed   By: Jasmine Pang M.D.   On: 06/26/2022 20:53   CT CHEST ABDOMEN PELVIS W CONTRAST  Result Date: 06/26/2022 CLINICAL DATA:  Fall with laceration bilateral rib cage pain EXAM: CT CHEST, ABDOMEN, AND PELVIS WITH CONTRAST TECHNIQUE: Multidetector CT imaging of the chest, abdomen and pelvis was performed following the standard protocol during bolus administration of intravenous contrast. RADIATION DOSE REDUCTION: This exam was performed according to the departmental dose-optimization program which includes automated exposure control, adjustment of the mA and/or kV according to patient size and/or use of iterative reconstruction technique. CONTRAST:  OMNIPAQUE IOHEXOL 300 MG/ML  SOLN COMPARISON:  Chest x-ray 06/26/2022 CT 02/16/2021, ultrasound 04/17/2014 FINDINGS: CT CHEST FINDINGS Cardiovascular: Left-sided  pacing device. Mild aortic atherosclerosis. No aneurysm. Mild coronary vascular calcification. Borderline cardiomegaly. No pericardial effusion Mediastinum/Nodes: Midline trachea. No thyroid mass. No suspicious lymph nodes. Esophagus within normal limits. Lungs/Pleura: Mild subpleural reticulation. No acute airspace disease, pleural effusion, or pneumothorax Musculoskeletal: Sternum appears intact. There are multiple old bilateral rib fractures. Acute appearing right fourth and fifth lateral rib fractures. Acute displaced right seventh lateral to anterolateral rib fractures. Acute appearing left sixth anterior rib fracture. Acute nondisplaced fracture at the left seventh costochondral junction. CT ABDOMEN PELVIS FINDINGS Hepatobiliary: No calcified gallstone or biliary dilatation. Vague hypodense mass in the left hepatic lobe measuring 3.5 cm, questionably corresponds to a hypodense lesions seen on the ultrasound from 2015, not clearly seen on interval CT from 2022. Pancreas: Unremarkable. No pancreatic ductal dilatation or surrounding inflammatory changes. Spleen: Normal in size without focal abnormality. Adrenals/Urinary Tract: Adrenal glands are unremarkable. Kidneys are normal, without renal calculi, focal lesion, or hydronephrosis. Bladder is unremarkable. Stomach/Bowel: Stomach is within normal limits. Mild diverticular disease of the colon without acute wall thickening. No evidence of bowel wall thickening, distention, or inflammatory changes. Vascular/Lymphatic: Mild aortic atherosclerosis. No aneurysm. No suspicious lymph nodes. Reproductive: Prostate is unremarkable. Other: Negative for pelvic effusion or free air Musculoskeletal: Hip replacements with artifact. Treated compression deformity at T12. No acute osseous abnormality IMPRESSION: 1. Negative for acute intrathoracic the abdominal, or intrapelvic abnormality. 2. Multiple old bilateral rib fractures. There are acute appearing right fourth, fifth and  seventh rib fractures as well as acute appearing left sixth and seventh rib fractures. No pneumothorax. 3. 3.5 cm vague hypodense mass in the left hepatic lobe, possibly corresponds to a vague ultrasound abnormality from 2015 and therefore favoring a benign process, but suggest further evaluation with nonemergent/outpatient multiphasic hepatic CT. Electronically Signed   By: Jasmine Pang M.D.   On: 06/26/2022 20:51   DG Chest 2 View  Result Date: 06/26/2022 CLINICAL DATA:  Trauma, fall EXAM: CHEST - 2 VIEW COMPARISON:  02/15/2021 FINDINGS: Cardiac size is within normal limits. There are no signs of pulmonary edema. Small linear densities are seen in both lower lung fields.  There is no focal consolidation. There is no pleural effusion or pneumothorax. Pacemaker battery is seen in the left infraclavicular region. There is previous arthroplasty in both shoulders. There is previous kyphoplasty in 1 of the lower thoracic vertebral bodies. IMPRESSION: There are no signs of pulmonary edema or focal pulmonary consolidation. Small linear densities in the lower lung fields suggest scarring or subsegmental atelectasis. Electronically Signed   By: Ernie Avena M.D.   On: 06/26/2022 16:57    Procedures Procedures    Medications Ordered in ED Medications  oxyCODONE-acetaminophen (PERCOCET/ROXICET) 5-325 MG per tablet 1 tablet (1 tablet Oral Given 06/26/22 1758)  HYDROmorphone (DILAUDID) injection 1 mg (1 mg Intravenous Given 06/26/22 1928)  ondansetron (ZOFRAN) injection 4 mg (4 mg Intravenous Given 06/26/22 1927)  iohexol (OMNIPAQUE) 300 MG/ML solution 100 mL (100 mLs Intravenous Contrast Given 06/26/22 2031)    ED Course/ Medical Decision Making/ A&P                           Medical Decision Making Patient fell yesterday striking his chin and his ribs  Amount and/or Complexity of Data Reviewed Independent Historian: spouse    Details: Patient is here with his wife who is supportive External  Data Reviewed: notes. Labs: ordered.    Details: Patient is an elevated white blood cell count of 13.2 Radiology: ordered and independent interpretation performed. Decision-making details documented in ED Course.    Details: Chest x-ray is negative CT chest shows right fourth sixth and seventh rib fractures.  CT scan shows left sixth and seventh rib fractures  Risk Prescription drug management. Risk Details: Patient given Percocet without relief.  Patient is given IV Zofran and Dilaudid for pain.  He is advised to follow-up with his primary care provider for recheck of fractured ribs as well as further evaluation for possible liver  Right fourth fifth and seventh rib fractures         Final Clinical Impression(s) / ED Diagnoses Final diagnoses:  Closed fracture of multiple ribs of both sides, initial encounter    Rx / DC Orders ED Discharge Orders          Ordered    oxyCODONE-acetaminophen (PERCOCET) 5-325 MG tablet  Every 4 hours PRN        06/26/22 2117           An After Visit Summary was printed and given to the patient.    Elson Areas, PA-C 06/26/22 2148    Derwood Kaplan, MD 07/04/22 [phone]

## 2022-07-01 ENCOUNTER — Ambulatory Visit (HOSPITAL_BASED_OUTPATIENT_CLINIC_OR_DEPARTMENT_OTHER): Payer: Commercial Managed Care - HMO | Admitting: Physical Therapy

## 2022-07-03 ENCOUNTER — Encounter: Payer: Self-pay | Admitting: Family Medicine

## 2022-07-03 ENCOUNTER — Ambulatory Visit (INDEPENDENT_AMBULATORY_CARE_PROVIDER_SITE_OTHER): Payer: Commercial Managed Care - HMO | Admitting: Family Medicine

## 2022-07-03 VITALS — BP 124/80 | HR 66 | Temp 97.9°F | Ht 71.0 in | Wt 271.0 lb

## 2022-07-03 DIAGNOSIS — R160 Hepatomegaly, not elsewhere classified: Secondary | ICD-10-CM

## 2022-07-03 DIAGNOSIS — F1021 Alcohol dependence, in remission: Secondary | ICD-10-CM

## 2022-07-03 DIAGNOSIS — I10 Essential (primary) hypertension: Secondary | ICD-10-CM

## 2022-07-03 DIAGNOSIS — I5042 Chronic combined systolic (congestive) and diastolic (congestive) heart failure: Secondary | ICD-10-CM | POA: Diagnosis not present

## 2022-07-03 LAB — COMPREHENSIVE METABOLIC PANEL
ALT: 21 U/L (ref 0–53)
AST: 16 U/L (ref 0–37)
Albumin: 4.1 g/dL (ref 3.5–5.2)
Alkaline Phosphatase: 60 U/L (ref 39–117)
BUN: 26 mg/dL — ABNORMAL HIGH (ref 6–23)
CO2: 31 mEq/L (ref 19–32)
Calcium: 9.8 mg/dL (ref 8.4–10.5)
Chloride: 97 mEq/L (ref 96–112)
Creatinine, Ser: 1.22 mg/dL (ref 0.40–1.50)
GFR: 64.19 mL/min (ref >60.00)
Glucose, Bld: 111 mg/dL — ABNORMAL HIGH (ref 70–99)
Potassium: 4.2 mEq/L (ref 3.5–5.1)
Sodium: 135 mEq/L (ref 135–145)
Total Bilirubin: 0.4 mg/dL (ref 0.2–1.2)
Total Protein: 6.9 g/dL (ref 6.0–8.3)

## 2022-07-03 LAB — CBC WITH DIFFERENTIAL/PLATELET
Basophils Absolute: 0.1 10*3/uL (ref 0.0–0.1)
Basophils Relative: 0.6 % (ref 0.0–3.0)
Eosinophils Absolute: 0.2 10*3/uL (ref 0.0–0.7)
Eosinophils Relative: 1.7 % (ref 0.0–5.0)
HCT: 34 % — ABNORMAL LOW (ref 39.0–52.0)
Hemoglobin: 11.1 g/dL — ABNORMAL LOW (ref 13.0–17.0)
Lymphocytes Relative: 12.5 % (ref 12.0–46.0)
Lymphs Abs: 1.2 10*3/uL (ref 0.7–4.0)
MCHC: 32.7 g/dL (ref 30.0–36.0)
MCV: 98.5 fl (ref 78.0–100.0)
Monocytes Absolute: 0.9 10*3/uL (ref 0.1–1.0)
Monocytes Relative: 9.5 % (ref 3.0–12.0)
Neutro Abs: 6.9 10*3/uL (ref 1.4–7.7)
Neutrophils Relative %: 75.7 % (ref 43.0–77.0)
Platelets: 284 10*3/uL (ref 150.0–400.0)
RBC: 3.45 Mil/uL — ABNORMAL LOW (ref 4.22–5.81)
RDW: 13.3 % (ref 11.5–15.5)
WBC: 9.2 10*3/uL (ref 4.0–10.5)

## 2022-07-03 MED ORDER — OXYCODONE-ACETAMINOPHEN 5-325 MG PO TABS: 1.0000 | ORAL_TABLET | ORAL | 0 refills | Status: DC | PRN

## 2022-07-03 NOTE — Progress Notes (Signed)
Phone [phone] In person visit   Subjective:   John [name] is a 61 y.o. year old very pleasant male patient who presents for/with See problem oriented charting Chief Complaint  Patient presents with   Follow-up    Pt is here to f/u from ER for rib fracture, pt is still experiencing pain.   Past Medical History-  Patient Active Problem List   Diagnosis Date Noted   Hyponatremia 02/16/2021    Priority: High   Paroxysmal atrial fibrillation ([place]) 04/14/2020    Priority: High   S/P ICD (internal cardiac defibrillator) procedure 04/14/2020    Priority: High   Status post reverse total shoulder replacement, left 11/11/2019    Priority: High   Non-ischemic cardiomyopathy ([place])     Priority: High   Chronic combined systolic and diastolic CHF, NYHA class 3 ([place]) 10/2016    Priority: High   Alcohol dependence in remission ([place]) 03/19/2016    Priority: High   Aortic atherosclerosis ([place]) 03/22/2021    Priority: Medium    GAD (generalized anxiety disorder) 05/31/2020    Priority: Medium    Major depression, recurrent, full remission ([place]) 04/15/2019    Priority: Medium    Hyperlipidemia 05/27/2018    Priority: Medium    Essential hypertension 11/08/2016    Priority: Medium    GI bleed 03/19/2016    Priority: Medium    ED (erectile dysfunction) 08/21/2015    Priority: Medium    Hyperglycemia 08/21/2015    Priority: Medium    Insomnia 08/21/2015    Priority: Medium    OSA (obstructive sleep apnea) 08/21/2015    Priority: Medium    Retained orthopedic hardware 11/20/2018    Priority: Low   Unspecified fracture of upper end of left humerus, initial encounter for closed fracture 09/03/2017    Priority: Low   S/P shoulder replacement 11/24/2015    Priority: Low   Allergic rhinitis 08/21/2015    Priority: Low   Degenerative joint disease of left hip 03/27/2015    Priority: Low   Closed fracture of distal end of left radius 02/01/2022    Priority: 1.   Lumbar  spondylosis 01/16/2022    Priority: 1.   Osteoarthritis of right hip 05/03/2021    Priority: 1.   Osteoarthritis of left hip 05/03/2021    Priority: 1.   Thoracic compression fracture ([place]) 02/22/2021    Priority: 1.   Avascular necrosis of left humeral head ([place]) 11/20/2018    Priority: 1.   Left shoulder pain 11/04/2018    Priority: 1.    Medications- reviewed and updated Current Outpatient Medications  Medication Sig Dispense Refill   carvedilol (COREG) 25 MG tablet Take 25 mg by mouth 2 (two) times daily.     diphenhydramine-acetaminophen (TYLENOL PM) 25-500 MG TABS tablet Take 2 tablets by mouth at bedtime.     docusate sodium (COLACE) 100 MG capsule Take 1 capsule (100 mg total) by mouth 2 (two) times daily. 10 capsule 0   ELIQUIS 5 MG TABS tablet TAKE 1 TABLET BY MOUTH TWICE A DAY 180 tablet 3   ENTRESTO 97-103 MG TAKE 1 TABLET BY MOUTH TWICE A DAY 60 tablet 5   escitalopram (LEXAPRO) 10 MG tablet TAKE 1 TABLET BY MOUTH EVERY DAY 90 tablet 2   FARXIGA 10 MG TABS tablet TAKE 1 TABLET BY MOUTH EVERY DAY BEFORE BREAKFAST 90 tablet 3   fluticasone (FLONASE) 50 MCG/ACT nasal spray Place 1 spray into both nostrils daily. Use twice a day until  symptoms under control, then reduce to daily use. 16 g 2   hydrOXYzine (ATARAX) 25 MG tablet Take 25 mg by mouth as needed.     ketoconazole (NIZORAL) 2 % cream Apply 1 Application topically daily. To all rash areas- also pending dermatology visit. Use for at least 2 weeks and at least a week after resolution. 60 g 1   methocarbamol (ROBAXIN) 500 MG tablet Take 1-2 tablets (500-1,000 mg total) by mouth 4 (four) times daily. 40 tablet 0   oxyCODONE-acetaminophen (PERCOCET) 5-325 MG tablet Take 1 tablet by mouth every 4 (four) hours as needed for severe pain (do not drive or drink alcohol within 8 hours of medication.). 20 tablet 0   rosuvastatin (CRESTOR) 20 MG tablet TAKE 1 TABLET BY MOUTH EVERY DAY 90 tablet 3   sildenafil (VIAGRA) 100 MG tablet  TAKE 0.5 TABLETS (50 MG TOTAL) BY MOUTH AT BEDTIME AS NEEDED FOR ERECTILE DYSFUNCTION. 10 tablet 3   spironolactone (ALDACTONE) 50 MG tablet Take 1 tablet (50 mg total) by mouth daily. 90 tablet 3   torsemide (DEMADEX) 20 MG tablet TAKE 3 TABLETS BY MOUTH EVERY DAY 270 tablet 1   traZODone (DESYREL) 50 MG tablet TAKE 1 TABLET BY MOUTH EVERY DAY AT BEDTIME AS NEEDED 90 tablet 3   No current facility-administered medications for this visit.     Objective:  BP 124/80   Pulse 66   Temp 97.9 F (36.6 C)   Ht 5\' 11"  (1.803 m)   Wt 271 lb (122.9 kg)   SpO2 95%   BMI 37.80 kg/m  Gen: NAD, resting comfortably CV: Regular heart rate no murmurs rubs or gallops Lungs: CTAB no crackles, wheeze, rhonchi Tender over right and left lateral chest wall Ext: Trace edema Skin: warm, dry     Assessment and Plan   #Fall/rib fractures S: Patient was seen in the emergency department on 06/26/2022 after coming out of a restaurant and tripping over a curb-unfortunately he struck his head on the ground as well as chest and complained of severe right chest pain.  He did not lose consciousness.  CT of the chest showed right 4, 5, and 7th rib fractures as well as left sixth and seventh rib fractures.  CT of head with no acute intracranial abnormality  For pain control he was prescribed oxycodone-acetaminophen 5-325 mg-he reports taking this once or twice a day and doesn't help a ton- just takes edge off. Coughing worsens pain. Sometimes talking worsens pain. He is using incentive spirometer thankfully -taking something to help him with bowel movements while being on oxycodone- miralax after dulcolax didn't help A/P: Patient with recent rib fractures x5 with ongoing pain - Thankfully managing constipation with MiraLAX on narcotics - Has 2 remaining oxycodone-he feels these may not be strong enough but I am cautious about increasing especially as we get further out from the fracture-I gave him another refill of  #20 and hoping this will last 10 to 14 days-I would not want to give more than 2 more sets of refills beyond this without an office visit - Also cautioned on use of oxycodone along with alcohol-reports not drinking alcohol since taking oxycodone - We also discussed can use Tylenol to help with pain but needs to max out Tylenol/acetaminophen at 2000 mg/day-want to be cautious with alcohol history  #Alcohol abuse history  - no drinking associated with fall per patient report -6 pack per week prior to fall but staying off with oxycodone-thankfully has reduced overall -Appears  to be in remission with 6 or less per week but would prefer full cessation  #Liver mass at 3.5 cm- will reach out about CT when feeling better from fractures-see imaging report and suggestion for hepatic CT -ALT slightly high at 55 but wonder about fatty liver contributing  #CHF  #hypertension S: medication: Carvedilol 25 mg twice daily, Entresto 97-103 mg daily, torsemide 20 mg daily (down from 3 a day), spironolactone 50mg   Weight down 2 pounds from last visit with me, no increased edema BP Readings from Last 3 Encounters:  07/03/22 124/80  06/26/22 115/70  06/17/22 (!) 96/52  A/P: Hypertension is well controlled-continue current medication  CHF appears euvolemic with weight down and no increased edema  #Hyponatremia-as low as 130 on recent hospital labs-we opted to recheck today to make sure not trending down further-multiple medications could contribute including escitalopram (previously on sertraline but should not be on both), trazodone, spironolactone, torsemide -We discussed adjusting medicine if needed especially if below 130   # Depression S: Medication: lexapro 10 mg, trazodone 50 mg for sleep A/P: He reports good control on current medications-denies anhedonia or depressed mood-continue current medicine-once again if sodium remains low want to make sure not still on sertraline  Recommended follow up:  Return for next already scheduled visit or sooner if needed. Future Appointments  Date Time Provider Department Center  07/08/2022  1:00 PM 07/10/2022, PT [facility]  07/15/2022  1:00 PM 07/17/2022, PT [facility]  07/23/2022  1:45 PM 13/03/2022, PT [facility]  07/30/2022  7:00 AM [facility] DEVICE REMOTES [facility]  07/31/2022 11:00 AM [facility] PA/NP [facility] None  10/29/2022  7:00 AM [facility] DEVICE REMOTES [facility]  11/15/2022  2:00 PM 01/15/2023, MD [facility]  01/28/2023  7:00 AM [facility] DEVICE REMOTES [facility]  04/29/2023  7:00 AM [facility] DEVICE REMOTES [facility]  07/29/2023  7:00 AM [facility] DEVICE REMOTES [facility]  10/28/2023  7:00 AM [facility] DEVICE REMOTES [facility]    Lab/Order associations:   ICD-10-CM   1. Essential hypertension  I10 CBC with Differential/Platelet    Comprehensive metabolic panel    2. Chronic combined systolic and diastolic CHF, NYHA class 3 ([facility])  I50.42     3. Alcohol dependence in remission ([facility])  F10.21     4. Liver mass  R16.0       Meds ordered this encounter  Medications   oxyCODONE-acetaminophen (PERCOCET) 5-325 MG tablet    Sig: Take 1 tablet by mouth every 4 (four) hours as needed for severe pain (do not drive or drink alcohol within 8 hours of medication.).    Dispense:  20 tablet    Refill:  0    Return precautions advised.  12/26/2023, MD

## 2022-07-03 NOTE — Patient Instructions (Addendum)
Since you are only taking oxycodone-acetaminophen 5-325 mg twice a day maximum you could take another up to 1300 mg max tylenol per day- tylenol arthritis 650 would be one option because you could take just 2 a day spaced by 8 hours to give better baseline coverage. Limit overall tylenol to 2000mg  per day total over 24 hours (so if taking tylenol PM) then reduce daytime tylenol  When you are feeling better hopefully within next month let me know through mychart and I will order CT follow up for the liver  Please stop by lab before you go If you have mychart- we will send your results within 3 business days of [place] receiving them.  If you do not have mychart- we will call you about results within 5 business days of [place] receiving them.  *please also note that you will see labs on mychart as soon as they post. I will later go in and write notes on them- will say "notes from Dr. Yong Channel"   Recommended follow up: Return for next already scheduled visit or sooner if needed.

## 2022-07-05 ENCOUNTER — Telehealth: Payer: Self-pay | Admitting: Family Medicine

## 2022-07-05 ENCOUNTER — Encounter: Payer: Self-pay | Admitting: Family Medicine

## 2022-07-05 NOTE — Telephone Encounter (Signed)
..  Type of form received: Long term disbility   Additional comments:   Received by: [employer]  Form should be Faxed to:[phone]  Form should be mailed to:  na   Is patient requesting call for pickup: na    Form placed:  dr hunters folder   Attach charge sheet.  Yes  Individual made aware of 3-5 business day turn around (Y/N)?  No

## 2022-07-08 ENCOUNTER — Encounter (HOSPITAL_COMMUNITY): Payer: Self-pay

## 2022-07-08 ENCOUNTER — Ambulatory Visit (HOSPITAL_BASED_OUTPATIENT_CLINIC_OR_DEPARTMENT_OTHER): Payer: Commercial Managed Care - HMO | Admitting: Physical Therapy

## 2022-07-08 NOTE — Progress Notes (Signed)
Disability forms awaiting MD Signature

## 2022-07-10 ENCOUNTER — Telehealth: Payer: Self-pay | Admitting: Family Medicine

## 2022-07-10 ENCOUNTER — Other Ambulatory Visit: Payer: Self-pay

## 2022-07-10 NOTE — Telephone Encounter (Signed)
Last refill: 07/03/22 #20, 0 Last OV: 07/03/22 dx. HTN

## 2022-07-10 NOTE — Telephone Encounter (Signed)
I sent a message to patient after receipt of the forms-he has not responded by MyChart -Please follow-up  Hey [name],   I received disability paperwork for you which includes a statement of disability.    It appears that you were originally noted to be disabled by cardiology. Is the disability company independently asking for an assessment from me? Are they saying cardiology assessment is not adequate?   If so, we need to have a visit specifically targeted at assessing this and I would need at least 40 minutes to meet with you (may take some time to schedule but you need to mention this when you call in) and work on forms (many details I do not have and it would be helpful if you decide to do this if you could bring copies of forms filled out by original decision maker on this - I would want to be consistent on dates and codes etc).    Thanks, Garret Reddish

## 2022-07-10 NOTE — Telephone Encounter (Signed)
Refill request sent to PCP.

## 2022-07-10 NOTE — Telephone Encounter (Signed)
..   Encourage patient to contact the pharmacy for refills or they can request refills through [place]:  07/03/22  NEXT APPOINTMENT DATE:  MEDICATION: oxyCODONE-acetaminophen (PERCOCET) 5-325 MG table   Is the patient out of medication?   PHARMACY:  [id number] - [place], [address] Phone: [phone]  Fax: [phone]      Let patient know to contact pharmacy at the end of the day to make sure medication is ready.  Please notify patient to allow 48-72 hours to process

## 2022-07-10 NOTE — Telephone Encounter (Signed)
We were hoping last prescription would last 10-14 (and its only been a week) days and begin to space out some- how often is he taking this and how many pills left?

## 2022-07-11 MED ORDER — OXYCODONE-ACETAMINOPHEN 5-325 MG PO TABS: 1.0000 | ORAL_TABLET | ORAL | 0 refills | Status: DC | PRN

## 2022-07-11 NOTE — Telephone Encounter (Signed)
Pt returned call, states he is in severe pain and he is taking 2 a day, he is not abusing it but its needing something to help with his excruciating pain. Tomorrow would be the 10th day and he takes 2 every 24 hours and does not want to take to much tylenol In between.

## 2022-07-11 NOTE — Telephone Encounter (Signed)
Pt states he is taking 2 pills a day. He is still in a great deal of pain. Please advise

## 2022-07-11 NOTE — Telephone Encounter (Signed)
I refilled this- if possible have him take a half tablet within a few days and see if that is helpful which will allow him to stretch out further

## 2022-07-11 NOTE — Telephone Encounter (Signed)
FYI

## 2022-07-12 NOTE — Telephone Encounter (Signed)
Called and spoke with pt and he states he is not concerned about the below right now, he is in to much pain from broken ribs and would like to get pain resolved first.

## 2022-07-15 ENCOUNTER — Encounter: Payer: Self-pay | Admitting: Internal Medicine

## 2022-07-15 ENCOUNTER — Ambulatory Visit (HOSPITAL_BASED_OUTPATIENT_CLINIC_OR_DEPARTMENT_OTHER): Payer: Commercial Managed Care - HMO | Admitting: Physical Therapy

## 2022-07-15 ENCOUNTER — Ambulatory Visit (INDEPENDENT_AMBULATORY_CARE_PROVIDER_SITE_OTHER): Payer: Commercial Managed Care - HMO | Admitting: Internal Medicine

## 2022-07-15 VITALS — BP 110/69 | HR 76 | Temp 98.7°F | Resp 14 | Ht 71.0 in | Wt 265.6 lb

## 2022-07-15 DIAGNOSIS — J189 Pneumonia, unspecified organism: Secondary | ICD-10-CM

## 2022-07-15 DIAGNOSIS — R062 Wheezing: Secondary | ICD-10-CM

## 2022-07-15 DIAGNOSIS — R051 Acute cough: Secondary | ICD-10-CM

## 2022-07-15 LAB — POC COVID19 BINAXNOW

## 2022-07-15 MED ORDER — LEVOFLOXACIN 500 MG PO TABS: 500.0000 mg | ORAL_TABLET | Freq: Every day | ORAL | 0 refills | Status: AC

## 2022-07-15 MED ORDER — METHYLPREDNISOLONE SODIUM SUCC 125 MG IJ SOLR: 80.0000 mg | Freq: Once | INTRAMUSCULAR | Status: DC

## 2022-07-15 MED ORDER — METHYLPREDNISOLONE ACETATE 80 MG/ML IJ SUSP
80.0000 mg | Freq: Once | INTRAMUSCULAR | Status: AC
  Administered 2022-07-15: 80 mg via INTRAMUSCULAR

## 2022-07-15 MED ORDER — PREDNISONE 20 MG PO TABS: ORAL_TABLET | ORAL | 0 refills | Status: DC

## 2022-07-15 NOTE — Patient Instructions (Addendum)
It was a pleasure seeing you today! I truly hope you feel like you received 5 star service and please let me know if there is anything I can improve.  Please go to our [facility] office to get your xrays done. You can walk in M-F between 8:30am- noon or 1pm - 5pm. Tell them you are there for xrays ordered by me. They will send me the results, then I will let you know the results with instructions.   Address: [address].  The Xray department is located in the basement.    John Olszewski, MD   Today the plan is... I am giving you a steroid shot of Solu-Medrol followed by prednisone tablets from the pharmacy which you can start tomorrow and also your antibiotic is Levaquin to treat presumed pneumonia.  If you get worse again he does have to go to the ER there are several serious complications possible but I think you should see improvement today.  Tomorrow morning go get the chest x-ray unless you are just feeling 100% better if you are feeling any worse just go ahead and skip the chest x-ray and go to the ER and I will do it there  Acute cough -     POC COVID-19 BinaxNow  Community acquired pneumonia of right lower lobe of lung -     DG Chest 2 View; Future -     predniSONE; Take 2 pills for 3 days, 1 pill for 4 days  Dispense: 10 tablet; Refill: 0 -     levoFLOXacin; Take 1 tablet (500 mg total) by mouth daily for 7 days.  Dispense: 7 tablet; Refill: 0  Wheezing -     methylPREDNISolone Sodium Succ       [x]  If your condition begins to worsen or become severe:  GO to the ER  [x]  If you are not doing well: RETURN to the office sooner  [x]  If you have follow-up questions / concerns: please contact me  -via phone [phone] OR MyChart messaging   [x]  Please bring all your medicines to each appointment  [x]   You will be contacted with the lab results as soon as they are available. For any labs or imaging tests, we will call you if the results are significantly  abnormal.  Most normal results will be posted to myChart as soon as they are available and I will comment on them there within 2-3 business days.  [x]  Billing questions for xray and lab orders are billed from separate companies and questions/concerns should be directed to the invoicing company.  For visit charges please discuss with our administrative services.  [x]  You will be contacted with the lab results as soon as they are available. The fastest way to get your results is to activate your My Chart account. Instructions are located on the last page of this paperwork. If you have not heard from regarding the results in 2 weeks, please contact this office.   Community-Acquired Pneumonia, Adult Pneumonia is a lung infection that causes inflammation and the buildup of mucus and fluids in the lungs. This may cause coughing and difficulty breathing. Community-acquired pneumonia is pneumonia that develops in people who are not, and have not recently been, in a hospital or other health care facility. Usually, pneumonia develops as a result of an illness that is caused by a virus, such as the common cold and the flu (influenza). It can also be caused by bacteria or  fungi. While the common cold and influenza can pass from person to person (are contagious), pneumonia itself is not considered contagious. What are the causes? This condition may be caused by: Viruses. Bacteria. Fungi. What increases the risk? The following factors may make you more likely to develop this condition: Being over age 11 or having certain medical conditions, such as: A long-term (chronic) disease, such as: chronic obstructive pulmonary disease (COPD), asthma, heart failure, diabetes, or kidney disease. A condition that increases the risk of breathing in (aspirating) mucus and other fluids from your mouth and nose. A weakened body defense system (immune system). Having had your spleen removed (splenectomy). The spleen is the  organ that helps fight germs and infections. Not cleaning your teeth and gums well (poor dental hygiene). Using tobacco products. Traveling to places where germs that cause pneumonia are present or being near certain animals or animal habitats that could have germs that cause pneumonia. What are the signs or symptoms? Symptoms of this condition include: A dry cough or a wet (productive) cough. A fever, sweating, or chills. Chest pain, especially when breathing deeply or coughing. Fast breathing, difficulty breathing, or shortness of breath. Tiredness (fatigue) and muscle aches. How is this diagnosed? This condition may be diagnosed based on your medical history or a physical exam. You may also have tests, including: Imaging, such as a chest X-ray or lung ultrasound. Tests of: The level of oxygen and other gases in your blood. Mucus from your lungs (sputum). Fluid around your lungs (pleural fluid). Your urine. How is this treated? Treatment for this condition depends on many factors, such as the cause of your pneumonia, your medicines, and other medical conditions that you have. For most adults, pneumonia may be treated at home. In some cases, treatment must happen in a hospital and may include: Medicines that are given by mouth (orally) or through an IV, including: Antibiotic medicines, if bacteria caused the pneumonia. Medicines that kill viruses (antiviral medicines), if a virus caused the pneumonia. Oxygen therapy. Severe pneumonia, although rare, may require the following treatments: Mechanical ventilation.This procedure uses a machine to help you breathe if you cannot breathe well on your own or maintain a safe level of blood oxygen. Thoracentesis. This procedure removes any buildup of pleural fluid to help with breathing. Follow these instructions at home:  Medicines Take over-the-counter and prescription medicines only as told by your health care provider. Take cough medicine  only if you have trouble sleeping. Cough medicine can prevent your body from removing mucus from your lungs. If you were prescribed antibiotics, take them as told by your health care provider. Do not stop taking the antibiotic even if you start to feel better. Lifestyle     Do not drink alcohol. Do not use any products that contain nicotine or tobacco. These products include cigarettes, chewing tobacco, and vaping devices, such as e-cigarettes. If you need help quitting, ask your health care provider. Eat a healthy diet. This includes plenty of vegetables, fruits, whole grains, low-fat dairy products, and lean protein. General instructions Rest a lot and get at least 8 hours of sleep each night. Sleep in a partly upright position at night. Place a few pillows under your head or sleep in a reclining chair. Return to your normal activities as told by your health care provider. Ask your health care provider what activities are safe for you. Drink enough fluid to keep your urine pale yellow. This helps to thin the mucus in your lungs. If  your throat is sore, gargle with a mixture of salt and water 3-4 times a day or as needed. To make salt water, completely dissolve -1 tsp (3-6 g) of salt in 1 cup (237 mL) of warm water. Keep all follow-up visits. How is this prevented? You can lower your risk of developing community-acquired pneumonia by: Getting the pneumonia vaccine. There are different types and schedules of pneumonia vaccines. Ask your health care provider which option is best for you. Consider getting the pneumonia vaccine if: You are older than 61 years of age. You are 18-33 years of age and are receiving cancer treatment, have chronic lung disease, or have other medical conditions that affect your immune system. Ask your health care provider if this applies to you. Getting your influenza vaccine every year. Ask your health care provider which type of vaccine is best for you. Getting  regular dental checkups. Washing your hands often with soap and water for at least 20 seconds. If soap and water are not available, use hand sanitizer. Contact a health care provider if: You have a fever. You have trouble sleeping because you cannot control your cough with cough medicine. Get help right away if: Your shortness of breath becomes worse. Your chest pain increases. Your sickness becomes worse, especially if you are an older adult or have a weak immune system. You cough up blood. These symptoms may be an emergency. Get help right away. Call 911. Do not wait to see if the symptoms will go away. Do not drive yourself to the hospital. Summary Pneumonia is an infection of the lungs. Community-acquired pneumonia develops in people who have not been in the hospital. It can be caused by bacteria, viruses, or fungi. This condition may be treated with antibiotics or antiviral medicines. Severe pneumonia may require a hospital stay and treatment to help with breathing. This information is not intended to replace advice given to you by your health care provider. Make sure you discuss any questions you have with your health care provider. Document Revised: 10/31/2021 Document Reviewed: 10/31/2021 Elsevier Patient Education  2023 [employer].

## 2022-07-15 NOTE — Progress Notes (Signed)
[place] at [facility]:  [phone]   Routine Medical Office Visit  Patient:  John [name]      Age: 61 y.o.       Sex:  male  Date:   07/15/2022  PCP:    Marin Olp, MD    [place] Provider: Loralee Pacas, MD  Assessment/Plan:   [name] was seen today for cough and covid test.  Acute cough -     POC COVID-19 BinaxNow  Community acquired pneumonia of right lower lobe of lung -     DG Chest 2 View; Future -     predniSONE; Take 2 pills for 3 days, 1 pill for 4 days  Dispense: 10 tablet; Refill: 0 -     levoFLOXacin; Take 1 tablet (500 mg total) by mouth daily for 7 days.  Dispense: 7 tablet; Refill: 0  Wheezing -     methylPREDNISolone Sodium Succ     Today's key discussion points - also in After Visit Summary (AVS) Common side effects, risks, benefits, and alternatives for medications and treatment plan prescribed today were discussed, and he expressed understanding of the given instructions.  Explained tendon rupture risk with levaquin but he want something strong for pneumonia bc he is coughing up thick stuff that's getting caught in his trachea and mainstem.  He was encouraged to contact our office by phone or message via MyChart if he has any questions or concerns regarding our treatment plan (see AVS).  No barriers to understanding were identified We discussed red flag symptoms and signs in detail and when to call the office or go to ER if his condition worsens (see AFTER VISIT SUMMARY). I explained pneumothorax risk in detail and need to call 911 if any worsening.  He expressed understanding.    Subjective:   John [name] is a 61 y.o. male with PMH significant for: Past Medical History:  Diagnosis Date   AICD (automatic cardioverter/defibrillator) present    [employer]   Anxiety    Ativan   Chronic combined systolic and diastolic CHF, NYHA class 3 ([place]) 10/2016   Nonischemic cardiomyopathy. EF 20-25%.   Chronic left hip  pain    Depression    history of   Dysrhythmia    A-FIB   GI bleed 03/2016   Headache    Hypertensive heart disease with combined systolic and diastolic congestive heart failure ([place]) 10/2016   Nonischemic cardiomyopathy ([place]) 10/2016   Echo with EF 20-25%. Cardiac catheterization with no CAD. LVEDP was 41 mmHg, PCWP 36 mmHg   Osteoarthritis    right shoulder   Right hand fracture    Seasonal allergies    Sleep apnea    wears a CPAP   Wears glasses      He is presenting today with: Chief Complaint  Patient presents with   Cough    Constant, productive with greenish mucus for about a week.   COVID test    At home, was negative yesterday.     Additional physician collected history: See Assessment/Plan section for per problem updates to history (overview and a/p subsections) as reported by patient today. Fall October 10 leading to fracture of 3 ribs on the right 2 on the left and for the last 3 weeks has been using the incentive spirometer but it admits only to 3 times a day instead of the 10 times a day that was recommended. This the coughing has gotten a lot worse in the last week  or so as a deep cough hacking greenish sputum yellowish sputum feels like there is some chest congestion more on the right than the left in the last week but denies any fevers but he has had a lot of reduced appetite lately and just feeling kind of bad He is not really struggling that much to breathe that he feels like he needs to go to the hospital but maybe a little more short of breath than usual he reports that he can get a decent night sleep because he is just coughing all the time even through the night        Objective:  Physical Exam: BP 110/69 (BP Location: Left Arm, Patient Position: Sitting)   Pulse 76   Temp 98.7 F (37.1 C) (Temporal)   Resp 14   Ht 5\' 11"  (1.803 m)   Wt 265 lb 9.6 oz (120.5 kg)   SpO2 95%   BMI 37.04 kg/m   He is a polite, friendly, and genuine  person Constitutional: NAD, AAO, not ill-appearing Neuro: alert, no focal deficit obvious, articulate speech Psych: normal mood, behavior, thought content   Problem specific physical exam findings:  Coarse expiratory wheezing, speaks in full sentences Breathing slightly labored at times but sometimes breathing comfortably. Very loud coarse low pitch expiratory wheezing right middle and lower lung fields.   Results:  No results found for any visits on 07/15/22.   Recent Results (from the past 2160 hour(s))  Basic metabolic panel     Status: Abnormal   Collection Time: 04/24/22 12:12 PM  Result Value Ref Range   Sodium 131 (L) 135 - 145 mmol/L   Potassium 3.8 3.5 - 5.1 mmol/L   Chloride 98 98 - 111 mmol/L   CO2 22 22 - 32 mmol/L   Glucose, Bld 131 (H) 70 - 99 mg/dL    Comment: Glucose reference range applies only to samples taken after fasting for at least 8 hours.   BUN 19 6 - 20 mg/dL   Creatinine, Ser 0.98 0.61 - 1.24 mg/dL   Calcium 9.1 8.9 - 10.3 mg/dL   GFR, Estimated >60 >60 mL/min    Comment: (NOTE) Calculated using the CKD-EPI Creatinine Equation (2021)    Anion gap 11 5 - 15    Comment: Performed at [facility] [address], [place], [place] [address]  CUP PACEART REMOTE DEVICE CHECK     Status: None   Collection Time: 04/30/22  2:00 AM  Result Value Ref Range   Date Time Interrogation Session [phone]    Pulse Generator Manufacturer [id number]    Pulse Gen Model 1411-36Q Ellipse VR    Pulse Gen Serial Number [id number]    Clinic Name [facility]    Implantable Pulse Generator Type Implantable Cardiac Defibulator    Lead Channel Setting Sensing Sensitivity 0.5 mV   Lead Channel Setting Sensing Adaptation Mode Adaptive Sensing    Lead Channel Setting Pacing Pulse Width 0.5 ms   Lead Channel Setting Pacing Amplitude 2.5 V   Lead Channel Status NULL    Lead Channel Impedance Value 490 ohm   Lead Channel Sensing Intrinsic Amplitude 11.8 mV   Lead Channel  Pacing Threshold Amplitude 0.75 V   Lead Channel Pacing Threshold Pulse Width 0.5 ms   HighPow Impedance 86 ohm   HighPow Impedance 86 ohm   HighPow Imped Status NULL    HighPow Imped Status NULL    Battery Status MOS    Battery Remaining Longevity 68 mo  Battery Remaining Percentage 66.0 %   Battery Voltage 2.98 V   Brady Statistic RV Percent Paced 1.0 %  Comprehensive metabolic panel     Status: Abnormal   Collection Time: 06/26/22  7:16 PM  Result Value Ref Range   Sodium 130 (L) 135 - 145 mmol/L   Potassium 3.6 3.5 - 5.1 mmol/L   Chloride 90 (L) 98 - 111 mmol/L   CO2 30 22 - 32 mmol/L   Glucose, Bld 119 (H) 70 - 99 mg/dL    Comment: Glucose reference range applies only to samples taken after fasting for at least 8 hours.   BUN 34 (H) 8 - 23 mg/dL   Creatinine, Ser 1.26 (H) 0.61 - 1.24 mg/dL   Calcium 9.4 8.9 - 10.3 mg/dL   Total Protein 7.3 6.5 - 8.1 g/dL   Albumin 4.3 3.5 - 5.0 g/dL   AST 38 15 - 41 U/L   ALT 55 (H) 0 - 44 U/L   Alkaline Phosphatase 50 38 - 126 U/L   Total Bilirubin 0.4 0.3 - 1.2 mg/dL   GFR, Estimated >60 >60 mL/min    Comment: (NOTE) Calculated using the CKD-EPI Creatinine Equation (2021)    Anion gap 10 5 - 15    Comment: Performed at [facility], [address], [place], [place] [address]  CBC     Status: Abnormal   Collection Time: 06/26/22  7:16 PM  Result Value Ref Range   WBC 13.2 (H) 4.0 - 10.5 K/uL   RBC 3.66 (L) 4.22 - 5.81 MIL/uL   Hemoglobin 11.7 (L) 13.0 - 17.0 g/dL   HCT 35.1 (L) 39.0 - 52.0 %   MCV 95.9 80.0 - 100.0 fL   MCH 32.0 26.0 - 34.0 pg   MCHC 33.3 30.0 - 36.0 g/dL   RDW 13.1 11.5 - 15.5 %   Platelets 247 150 - 400 K/uL   nRBC 0.0 0.0 - 0.2 %    Comment: Performed at [facility], [address] [place], [place] [address]  CBC with Differential/Platelet     Status: Abnormal   Collection Time: 07/03/22 12:17 PM  Result Value Ref Range   WBC 9.2 4.0 - 10.5 K/uL   RBC 3.45 (L)  4.22 - 5.81 Mil/uL   Hemoglobin 11.1 (L) 13.0 - 17.0 g/dL   HCT 34.0 (L) 39.0 - 52.0 %   MCV 98.5 78.0 - 100.0 fl   MCHC 32.7 30.0 - 36.0 g/dL   RDW 13.3 11.5 - 15.5 %   Platelets 284.0 150.0 - 400.0 K/uL   Neutrophils Relative % 75.7 43.0 - 77.0 %   Lymphocytes Relative 12.5 12.0 - 46.0 %   Monocytes Relative 9.5 3.0 - 12.0 %   Eosinophils Relative 1.7 0.0 - 5.0 %   Basophils Relative 0.6 0.0 - 3.0 %   Neutro Abs 6.9 1.4 - 7.7 K/uL   Lymphs Abs 1.2 0.7 - 4.0 K/uL   Monocytes Absolute 0.9 0.1 - 1.0 K/uL   Eosinophils Absolute 0.2 0.0 - 0.7 K/uL   Basophils Absolute 0.1 0.0 - 0.1 K/uL  Comprehensive metabolic panel     Status: Abnormal   Collection Time: 07/03/22 12:17 PM  Result Value Ref Range   Sodium 135 135 - 145 mEq/L   Potassium 4.2 3.5 - 5.1 mEq/L   Chloride 97 96 - 112 mEq/L   CO2 31 19 - 32 mEq/L   Glucose, Bld 111 (H) 70 - 99 mg/dL   BUN 26 (H)  6 - 23 mg/dL   Creatinine, Ser 1.22 0.40 - 1.50 mg/dL   Total Bilirubin 0.4 0.2 - 1.2 mg/dL   Alkaline Phosphatase 60 39 - 117 U/L   AST 16 0 - 37 U/L   ALT 21 0 - 53 U/L   Total Protein 6.9 6.0 - 8.3 g/dL   Albumin 4.1 3.5 - 5.2 g/dL   GFR 64.19 >60.00 mL/min    Comment: Calculated using the CKD-EPI Creatinine Equation (2021)   Calcium 9.8 8.4 - 10.5 mg/dL

## 2022-07-18 ENCOUNTER — Encounter (INDEPENDENT_AMBULATORY_CARE_PROVIDER_SITE_OTHER): Payer: Commercial Managed Care - HMO | Admitting: Internal Medicine

## 2022-07-19 ENCOUNTER — Telehealth (HOSPITAL_COMMUNITY): Payer: Self-pay

## 2022-07-19 ENCOUNTER — Encounter (HOSPITAL_COMMUNITY): Payer: Self-pay

## 2022-07-19 NOTE — Telephone Encounter (Signed)
Forms faxed to [facility] on 07/19/22. My chart message sent to patient

## 2022-07-22 ENCOUNTER — Telehealth: Payer: Self-pay

## 2022-07-22 NOTE — Telephone Encounter (Signed)
That was an appropriate shock. Please have him come in for labs (proBNP, CMET, Mg) and then a office appointment on my DOD day. No driving. If he gets shocked again between now and then, should go to ED.

## 2022-07-22 NOTE — Telephone Encounter (Signed)
[employer] Alert  Device alert for sustained VF 11/5 @ 07:12, rate 226, ATP while charging, HV therapy 30J converting to regular VP. 3 NSVT from other days, no therapy.   Attempted to contact patient. No answer, LMTCB. Patient is due to see Dr. Sallyanne Kuster for in-clinic OV.

## 2022-07-22 NOTE — Telephone Encounter (Signed)
Spoke with pt regarding Dr. Victorino December recommendations and need for a return office visit. Pt states that someone was able to get him an appointment with Dr. Loletha Grayer on Wednesday, November 8. Confirmed this appointment with the pt. Advised pt that per Dr. Loletha Grayer, if he has another shock he needs to go to the hospital. Pt verbalizes understanding.

## 2022-07-22 NOTE — Telephone Encounter (Signed)
Patient returned call.   Reports he got up from the bed to go to the bathroom and felt "A jolt." Denies any symptoms such as chest pain, shortness of breath, dizziness or lightheadedness. Does report recent pneumonia and fx ribs but is improving. Patient made aware of [place] DMV driving restrictions x6 months / shock plan. Forwarding to Dr. Sallyanne Kuster for review.   Also, patient needs apt. With Dr. Sallyanne Kuster

## 2022-07-22 NOTE — Telephone Encounter (Signed)
Left message for pt to call back  °

## 2022-07-23 ENCOUNTER — Ambulatory Visit (HOSPITAL_BASED_OUTPATIENT_CLINIC_OR_DEPARTMENT_OTHER): Payer: Commercial Managed Care - HMO | Attending: Orthopedic Surgery | Admitting: Physical Therapy

## 2022-07-23 DIAGNOSIS — R262 Difficulty in walking, not elsewhere classified: Secondary | ICD-10-CM | POA: Insufficient documentation

## 2022-07-23 DIAGNOSIS — M5459 Other low back pain: Secondary | ICD-10-CM | POA: Insufficient documentation

## 2022-07-23 DIAGNOSIS — M6281 Muscle weakness (generalized): Secondary | ICD-10-CM | POA: Insufficient documentation

## 2022-07-24 ENCOUNTER — Ambulatory Visit: Payer: Commercial Managed Care - HMO | Attending: Cardiovascular Disease | Admitting: Cardiovascular Disease

## 2022-07-24 ENCOUNTER — Encounter: Payer: Self-pay | Admitting: Cardiovascular Disease

## 2022-07-24 VITALS — BP 94/57 | HR 76 | Ht 71.0 in | Wt 264.6 lb

## 2022-07-24 DIAGNOSIS — I5042 Chronic combined systolic (congestive) and diastolic (congestive) heart failure: Secondary | ICD-10-CM | POA: Diagnosis not present

## 2022-07-24 DIAGNOSIS — I472 Ventricular tachycardia, unspecified: Secondary | ICD-10-CM

## 2022-07-24 DIAGNOSIS — Z95810 Presence of automatic (implantable) cardiac defibrillator: Secondary | ICD-10-CM

## 2022-07-24 DIAGNOSIS — G4733 Obstructive sleep apnea (adult) (pediatric): Secondary | ICD-10-CM

## 2022-07-24 MED ORDER — TORSEMIDE 20 MG PO TABS: 40.0000 mg | ORAL_TABLET | Freq: Every day | ORAL | 2 refills | Status: DC

## 2022-07-24 MED ORDER — SPIRONOLACTONE 50 MG PO TABS: 25.0000 mg | ORAL_TABLET | Freq: Every day | ORAL | 2 refills | Status: DC

## 2022-07-24 NOTE — Patient Instructions (Addendum)
Medication Instructions:  Your physician has recommended you make the following change in your medication:  DECREASE: Spironolactone 25mg  daily (0.5 tablet)  DECREASE: Torsemide 40mg  daily. (2 tablets)   *If you need a refill on your cardiac medications before your next appointment, please call your pharmacy*   Lab Work: Your physician recommends that you have the following labs drawn today: Pro BNP, CMET, CBC and Magnesium.  If you have labs (blood work) drawn today and your tests are completely normal, you will receive your results only by: MyChart Message (if you have MyChart) OR A paper copy in the mail If you have any lab test that is abnormal or we need to change your treatment, we will call you to review the results.   Testing/Procedures: NONE   Follow-Up: At [facility], you and your health needs are our priority.  As part of our continuing mission to provide you with exceptional heart care, we have created designated Provider Care Teams.  These Care Teams include your primary Cardiologist (physician) and Advanced Practice Providers (APPs -  Physician Assistants and Nurse Practitioners) who all work together to provide you with the care you need, when you need it.  We recommend signing up for the patient portal called "MyChart".  Sign up information is provided on this After Visit Summary.  MyChart is used to connect with patients for Virtual Visits (Telemedicine).  Patients are able to view lab/test results, encounter notes, upcoming appointments, etc.  Non-urgent messages can be sent to your provider as well.   To learn more about what you can do with MyChart, go to .    Your next appointment:   3 month(s)  The format for your next appointment:   In Person  Provider:   [facility], MD

## 2022-07-24 NOTE — Progress Notes (Signed)
Cardiology Office Note:    Date:  07/26/2022   ID:  John [name], DOB 1960/12/16, MRN [id number]  PCP:  Shelva Majestic, MD  Cardiologist:  Darrel Reach (CHF)  Referring MD: Shelva Majestic, MD   No chief complaint on file.    History of Present Illness:    John [name] is a 61 y.o. male with a hx of severe nonischemic cardiomyopathy (with left ventricular ejection fraction as low as 15-20%, most recently estimated to be 25-30 % by an echo 01/16/2021) returns in follow-up for single-chamber [facility] defibrillator for primary prevention.  He also has a history obstructive sleep apnea on CPAP and paroxysmal atrial fibrillation requiring cardioversion December 2020.  We asked him to come to the office today after his are appropriately delivered a high voltage shock for sustained ventricular tachycardia.  He did not have syncope.  He recalls getting up to the bathroom, taking a couple of Tylenol PM since he had difficulty sleeping and then when he laid back in bed felt very unusual and weak, before receiving the defibrillator shock.  Not have chest pain or shortness of breath prior to this.  This is the first time his defibrillator has delivered high-voltage therapy, but he did have appropriate antitachycardia pacing for ventricular tachycardia in April of this year.  He has recently overindulged in alcohol.  He is concerned that the episode of VT might be also related to the use of Cialis.  He took a dose the evening before.  Interrogation of his defibrillator shows 4 episodes of ventricular tachycardia, 3 of them nonsustained, all within the last 4-5 weeks (2 episodes on October 11, 1 episode on October 16, the sustained episode leading to shock on November 5).  All of these have a pattern of torsades de pointes.  He has frequent PVCs which are followed by very long pauses and then a ventricular paced beat (device programmed VVI 40 bpm), with a PVC following on the T wave  of the ventricular paced beat leading to the onset of torsades.  The sustained episode reached a average ventricular rate of 226 bpm.  Presenting rhythm today is sinus rhythm with frequent PVCs.  He has generalized low voltage that is probably attributable to obesity.  He has diffuse nonspecific T wave changes but his QTc is normal at 436 ms.  Checked labs today and his electrolytes are within normal range. He does not have any evidence of heart failure exacerbation either by clinical exam or based on his thoracic impedance.   ICD function is otherwise normal.  Estimated generator longevity is almost 6 years.  All lead parameters are normal.  He has not had any episodes of atrial fibrillation.    Independently of the episode of defibrillator discharge she has had problems with orthostatic dizziness that has been quite pervasive.  His blood pressure is low today and he has some mild orthostatic hypotension (95/64 sitting, 85/58 standing, 86/55 minutes standing, without a significant change in heart rate).  Problems with orthopnea, PND, lower extremity edema, focal neurological complaints, intermittent claudication.  Generally has NYHA functional class II exertional dyspnea.  He has a history of a lumbar vertebroplasty and right total hip repair by Dr. Linna Caprice and Dr. Shon Baton respectively.  No cardiac issues during those surgeries.  He reports compliance with CPAP and denies daytime hypersomnolence.  He is on maximum dose Entresto and carvedilol and also takes [place] and spironolactone.    Past Medical History:  Diagnosis Date  AICD (automatic cardioverter/defibrillator) present    [employer]   Anxiety    Ativan   Chronic combined systolic and diastolic CHF, NYHA class 3 (HCC) 10/2016   Nonischemic cardiomyopathy. EF 20-25%.   Chronic left hip pain    Depression    history of   Dysrhythmia    A-FIB   GI bleed 03/2016   Headache    Hypertensive heart disease with combined systolic and  diastolic congestive heart failure (HCC) 10/2016   Nonischemic cardiomyopathy (HCC) 10/2016   Echo with EF 20-25%. Cardiac catheterization with no CAD. LVEDP was 41 mmHg, PCWP 36 mmHg   Osteoarthritis    right shoulder   Right hand fracture    Seasonal allergies    Sleep apnea    wears a CPAP   Wears glasses     Past Surgical History:  Procedure Laterality Date   CARDIAC CATHETERIZATION     CARDIOVERSION N/A 09/01/2019   Procedure: CARDIOVERSION;  Surgeon: Laurey Morale, MD;  Location: [facility] ENDOSCOPY;  Service: Cardiovascular;  Laterality: N/A;   ESOPHAGOGASTRODUODENOSCOPY (EGD) WITH PROPOFOL N/A 03/20/2016   Procedure: ESOPHAGOGASTRODUODENOSCOPY (EGD) WITH PROPOFOL;  Surgeon: Charlott Rakes, MD;  Location: [facility] ENDOSCOPY;  Service: Endoscopy;  Laterality: N/A;   ICD IMPLANT N/A 05/06/2018   Procedure: ICD IMPLANT;  Surgeon: Thurmon Fair, MD;  Location: [facility] INVASIVE CV LAB;  Service: Cardiovascular;  Laterality: N/A;   IRRIGATION AND DEBRIDEMENT SHOULDER Left 11/11/2019   Procedure: LEFT SHOULDER IRRIGATION AND DEBRIDEMENT WITH POLY EXCHANGE;  Surgeon: Jones Broom, MD;  Location: [facility] ORS;  Service: Orthopedics;  Laterality: Left;   KYPHOPLASTY N/A 02/22/2021   Procedure: KYPHOPLASTY T12;  Surgeon: Venita Lick, MD;  Location: [facility] OR;  Service: Orthopedics;  Laterality: N/A;  90 mins   ORIF WRIST FRACTURE Left 02/04/2022   Procedure: OPEN REDUCTION INTERNAL FIXATION (ORIF) WRIST FRACTURE;  Surgeon: Bradly Bienenstock, MD;  Location: [facility] OR;  Service: Orthopedics;  Laterality: Left;  with IV sedation   RIGHT/LEFT HEART CATH AND CORONARY ANGIOGRAPHY N/A 10/21/2016   Procedure: Right/Left Heart Cath and Coronary Angiography;  Surgeon: Runell Gess, MD;  Location: [facility] INVASIVE CV LAB;  Service: Cardiovascular: Angiographically normal coronary arteries. PCWP 33-36 mmHg, LVEDP 41 mmHg. PA pressure 60/35, mean 46 mmHg.  Cardiac output/cardiac index-3.93 /1.76 Hiram Comber), 3.49/1.57 (thermodilution)   TEE  WITHOUT CARDIOVERSION N/A 09/01/2019   Procedure: TRANSESOPHAGEAL ECHOCARDIOGRAM (TEE);  Surgeon: Laurey Morale, MD;  Location[facility] ENDOSCOPY;  Service: Cardiovascular;  Laterality: N/A;   TOTAL HIP ARTHROPLASTY Left 03/27/2015   Procedure: LEFT TOTAL HIP ARTHROPLASTY ANTERIOR APPROACH;  Surgeon: Samson Frederic, MD;  Location: [facility] OR;  Service: Orthopedics;  Laterality: Left;   TOTAL HIP ARTHROPLASTY Right 05/03/2021   Procedure: TOTAL HIP ARTHROPLASTY ANTERIOR APPROACH;  Surgeon: Samson Frederic, MD;  Location: [facility] ORS;  Service: Orthopedics;  Laterality: Right;   TOTAL SHOULDER ARTHROPLASTY Right 11/24/2015   Procedure: RIGHT TOTAL SHOULDER ARTHROPLASTY;  Surgeon: Beverely Low, MD;  Location: [facility] OR;  Service: Orthopedics;  Laterality: Right;   TOTAL SHOULDER ARTHROPLASTY Left 10/19/2019   Procedure: REVERSE TOTAL SHOULDER ARTHROPLASTY;  Surgeon: Jones Broom, MD;  Location: [facility] ORS;  Service: Orthopedics;  Laterality: Left;   TRANSTHORACIC ECHOCARDIOGRAM  10/2016   EF 20-25%. Diffuse hypokinesis but akinesis of the entire inferoseptal wall and apical wall. Moderate biatrial enlargement. PA pressure estimated 64 mmHg.   WISDOM TOOTH EXTRACTION      Current Medications: Current Meds  Medication Sig   carvedilol (COREG) 25 MG tablet Take 25  mg by mouth 2 (two) times daily.   diphenhydramine-acetaminophen (TYLENOL PM) 25-500 MG TABS tablet Take 2 tablets by mouth at bedtime.   ELIQUIS 5 MG TABS tablet TAKE 1 TABLET BY MOUTH TWICE A DAY   ENTRESTO 97-103 MG TAKE 1 TABLET BY MOUTH TWICE A DAY   escitalopram (LEXAPRO) 10 MG tablet TAKE 1 TABLET BY MOUTH EVERY DAY   FARXIGA 10 MG TABS tablet TAKE 1 TABLET BY MOUTH EVERY DAY BEFORE BREAKFAST   fluticasone (FLONASE) 50 MCG/ACT nasal spray Place 1 spray into both nostrils daily. Use twice a day until symptoms under control, then reduce to daily use.   hydrOXYzine (ATARAX) 25 MG tablet Take 25 mg by mouth as needed.   rosuvastatin (CRESTOR) 20 MG  tablet TAKE 1 TABLET BY MOUTH EVERY DAY   sildenafil (VIAGRA) 100 MG tablet TAKE 0.5 TABLETS (50 MG TOTAL) BY MOUTH AT BEDTIME AS NEEDED FOR ERECTILE DYSFUNCTION.   traZODone (DESYREL) 50 MG tablet TAKE 1 TABLET BY MOUTH EVERY DAY AT BEDTIME AS NEEDED   [DISCONTINUED] spironolactone (ALDACTONE) 50 MG tablet Take 1 tablet (50 mg total) by mouth daily.   [DISCONTINUED] torsemide (DEMADEX) 20 MG tablet TAKE 3 TABLETS BY MOUTH EVERY DAY     Allergies:   Cymbalta [duloxetine hcl] and Hydrocodone   Social History   Socioeconomic History   Marital status: Significant Other    Spouse name: Not on file   Number of children: 3   Years of education: Not on file   Highest education level: Not on file  Occupational History   Not on file  Tobacco Use   Smoking status: Never   Smokeless tobacco: Never  Vaping Use   Vaping Use: Never used  Substance and Sexual Activity   Alcohol use: Yes    Alcohol/week: 12.0 standard drinks of alcohol    Types: 12 Cans of beer per week    Comment: per week   Drug use: No   Sexual activity: Yes    Partners: Female    Comment: gf only  Other Topics Concern   Not on file  Social History Narrative   Engaged 2022- long term GF/lives with him. 2 dogs. Close with 3 sons that work for wife- christopher 15 at [employer], Hatch 21, Henrietta 25 in 2021.       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    Grew up in [place].    Social Determinants of Health   Financial Resource Strain: Not on file  Food Insecurity: Not on file  Transportation Needs: Not on file  Physical Activity: Not on file  Stress: Not on file  Social Connections: Not on file     Family History: The patient's family history includes Congenital heart disease in his sister; Dementia in his mother; Healthy in his brother, brother, brother, and sister; Lung cancer in his father; Prostate cancer in his brother.   His son also had a "hole in his heart" there was repaired surgically.  ROS:   Please see the history of present illness.    All other systems are reviewed and are negative  EKGs/Labs/Other Studies Reviewed:    The following studies were reviewed today: Notes from last heart failure visit with heart failure service from May 2022  EKG:  EKG is ordered today.  Shows sinus rhythm with PVCs and generalized low voltage arrest, nonspecific ST-T wave changes, QTC 467 ms  Recent Labs: 12/19/2021: TSH 1.39  01/22/2022: B Natriuretic Peptide 242.1 07/24/2022: ALT 53; BUN 30; Creatinine, Ser 1.22; Hemoglobin 11.9; Magnesium 2.2; NT-Pro BNP 516; Platelets 264; Potassium 4.4; Sodium 136  Recent Lipid Panel    Component Value Date/Time   CHOL 138 09/24/2021 1050   CHOL 111 06/09/2018 1137   TRIG 112 09/24/2021 1050   HDL 58 09/24/2021 1050   HDL 53 06/09/2018 1137   CHOLHDL 2.4 09/24/2021 1050   VLDL 22 09/24/2021 1050   LDLCALC 58 09/24/2021 1050   LDLCALC 71 08/24/2020 1004    Physical Exam:    VS:  BP (!) 94/57 (BP Location: Left Arm, Patient Position: Sitting, Cuff Size: Large)   Pulse 76   Ht 5\' 11"  (1.803 m)   Wt 120 kg   SpO2 97%   BMI 36.90 kg/m     Wt Readings from Last 3 Encounters:  07/24/22 120 kg  07/15/22 120.5 kg  07/03/22 122.9 kg      General: Alert, oriented x3, no distress, severely obese.  Normal appearance of the left subclavian defibrillator site Head: no evidence of trauma, PERRL, EOMI, no exophtalmos or lid lag, no myxedema, no xanthelasma; normal ears, nose and oropharynx Neck: normal jugular venous pulsations and no hepatojugular reflux; brisk carotid pulses without delay and no carotid bruits Chest: clear to auscultation, no signs of consolidation by percussion or palpation, normal fremitus, symmetrical and full respiratory excursions Cardiovascular: normal position and quality of the apical impulse, regular rhythm, normal first and second heart sounds, no  murmurs, rubs or gallops Abdomen: no tenderness or distention, no masses by palpation, no abnormal pulsatility or arterial bruits, normal bowel sounds, no hepatosplenomegaly Extremities: no clubbing, cyanosis or edema; 2+ radial, ulnar and brachial pulses bilaterally; 2+ right femoral, posterior tibial and dorsalis pedis pulses; 2+ left femoral, posterior tibial and dorsalis pedis pulses; no subclavian or femoral bruits Neurological: grossly nonfocal Psych: Normal mood and affect     ASSESSMENT:    1. VT (ventricular tachycardia) (HCC)   2. Chronic combined systolic (congestive) and diastolic (congestive) heart failure (HCC)   3. S/P ICD (internal cardiac defibrillator) procedure   4. Severe obesity (BMI 35.0-39.9) with comorbidity (HCC)   5. OSA (obstructive sleep apnea)      PLAN:    In order of problems listed above:  VT: All his episodes of VT that have occurred recently have a pattern of torsades de pointes.  This is despite the fact that his QT interval is normal on the resting ECG and his electrolytes are normal.  TdP is related to the long pauses that occur after his PVCs and the long QT of the subsequent ventricular paced beat. We will check a coronary CT angiogram to make sure he has not developed coronary disease and ischemia.  He needs to drastically reduce his intake of alcohol.  I do not think the PDE 5 inhibitor was the culprit here, but it is very likely that the increased burden of ventricular ectopic beats from alcohol is contributory.  Ultimately the best treatment will be to upgrade his ICD to a dual-chamber device to allow atrial pacing at 60 bpm and avoid the long pauses.  Refer to EP. CHF: Clinically euvolemic, NYHA functional class II, on comprehensive guideline directed medical therapy. ICD: Normal device function.  Continue downloads every 3 months.  I think he would benefit from upgrade to a dual-chamber device. Obesity: Discussed ways to limit overall calories,  especially carbohydrates and in particular carbs with high glycemic index.  OSA:  Ports compliance with CPAP.   Medication Adjustments/Labs and Tests Ordered: Current medicines are reviewed at length with the patient today.  Concerns regarding medicines are outlined above.  Orders Placed This Encounter  Procedures   Pro b natriuretic peptide (BNP)   Comprehensive metabolic panel   Magnesium   CBC   EKG 12-Lead    Meds ordered this encounter  Medications   spironolactone (ALDACTONE) 50 MG tablet    Sig: Take 0.5 tablets (25 mg total) by mouth daily.    Dispense:  45 tablet    Refill:  2    Please cancel all previous orders for current medication. Change in dosage or pill size.   torsemide (DEMADEX) 20 MG tablet    Sig: Take 2 tablets (40 mg total) by mouth daily.    Dispense:  180 tablet    Refill:  2     Patient Instructions  Medication Instructions:  Your physician has recommended you make the following change in your medication:  DECREASE: Spironolactone 25mg  daily (0.5 tablet)  DECREASE: Torsemide 40mg  daily. (2 tablets)   *If you need a refill on your cardiac medications before your next appointment, please call your pharmacy*   Lab Work: Your physician recommends that you have the following labs drawn today: Pro BNP, CMET, CBC and Magnesium.  If you have labs (blood work) drawn today and your tests are completely normal, you will receive your results only by: MyChart Message (if you have MyChart) OR A paper copy in the mail If you have any lab test that is abnormal or we need to change your treatment, we will call you to review the results.   Testing/Procedures: NONE   Follow-Up: At [facility], you and your health needs are our priority.  As part of our continuing mission to provide you with exceptional heart care, we have created designated Provider Care Teams.  These Care Teams include your primary Cardiologist (physician) and Advanced Practice  Providers (APPs -  Physician Assistants and Nurse Practitioners) who all work together to provide you with the care you need, when you need it.  We recommend signing up for the patient portal called "MyChart".  Sign up information is provided on this After Visit Summary.  MyChart is used to connect with patients for Virtual Visits (Telemedicine).  Patients are able to view lab/test results, encounter notes, upcoming appointments, etc.  Non-urgent messages can be sent to your provider as well.   To learn more about what you can do with MyChart, go to .    Your next appointment:   3 month(s)  The format for your next appointment:   In Person  Provider:   [facility], MD      Signed, [email or web address], MD  07/26/2022 9:06 AM     [facility]

## 2022-07-25 ENCOUNTER — Other Ambulatory Visit: Payer: Self-pay

## 2022-07-25 DIAGNOSIS — I472 Ventricular tachycardia, unspecified: Secondary | ICD-10-CM

## 2022-07-25 DIAGNOSIS — R0602 Shortness of breath: Secondary | ICD-10-CM

## 2022-07-25 LAB — CBC
Hematocrit: 36.1 % — ABNORMAL LOW (ref 37.5–51.0)
Hemoglobin: 11.9 g/dL — ABNORMAL LOW (ref 13.0–17.7)
MCH: 32.2 pg (ref 26.6–33.0)
MCHC: 33 g/dL (ref 31.5–35.7)
MCV: 98 fL — ABNORMAL HIGH (ref 79–97)
Platelets: 264 10*3/uL (ref 150–450)
RBC: 3.69 x10E6/uL — ABNORMAL LOW (ref 4.14–5.80)
RDW: 13.9 % (ref 11.6–15.4)
WBC: 11.2 10*3/uL — ABNORMAL HIGH (ref 3.4–10.8)

## 2022-07-25 LAB — MAGNESIUM: Magnesium: 2.2 mg/dL (ref 1.6–2.3)

## 2022-07-25 LAB — COMPREHENSIVE METABOLIC PANEL
ALT: 53 IU/L — ABNORMAL HIGH (ref 0–44)
AST: 43 IU/L — ABNORMAL HIGH (ref 0–40)
Albumin/Globulin Ratio: 2 (ref 1.2–2.2)
Albumin: 4.1 g/dL (ref 3.9–4.9)
Alkaline Phosphatase: 104 IU/L (ref 44–121)
BUN/Creatinine Ratio: 25 — ABNORMAL HIGH (ref 10–24)
BUN: 30 mg/dL — ABNORMAL HIGH (ref 8–27)
Bilirubin Total: 0.4 mg/dL (ref 0.0–1.2)
CO2: 27 mmol/L (ref 20–29)
Calcium: 9.5 mg/dL (ref 8.6–10.2)
Chloride: 96 mmol/L (ref 96–106)
Creatinine, Ser: 1.22 mg/dL (ref 0.76–1.27)
Globulin, Total: 2.1 g/dL (ref 1.5–4.5)
Glucose: 103 mg/dL — ABNORMAL HIGH (ref 70–99)
Potassium: 4.4 mmol/L (ref 3.5–5.2)
Sodium: 136 mmol/L (ref 134–144)
Total Protein: 6.2 g/dL (ref 6.0–8.5)
eGFR: 67 mL/min/{1.73_m2} (ref >59)

## 2022-07-25 LAB — PRO B NATRIURETIC PEPTIDE: NT-Pro BNP: 516 pg/mL — ABNORMAL HIGH (ref 0–210)

## 2022-07-26 ENCOUNTER — Encounter (HOSPITAL_COMMUNITY): Payer: Self-pay

## 2022-07-26 NOTE — Telephone Encounter (Signed)
Hi Emer, I see you have handled this and I am not aware of any paperwork.

## 2022-07-26 NOTE — Telephone Encounter (Signed)
[employer] with [employer] states they have not received Disability Forms/Records and requests Forms/Records to be faxed to Fax# [phone]  Reference# [id number]

## 2022-07-29 ENCOUNTER — Telehealth (HOSPITAL_COMMUNITY): Payer: Self-pay

## 2022-07-29 NOTE — Telephone Encounter (Signed)
Faxed disability paper work to [employer] on 07/29/22, patient aware

## 2022-07-30 ENCOUNTER — Ambulatory Visit: Payer: Commercial Managed Care - HMO | Attending: Orthopedic Surgery

## 2022-07-30 DIAGNOSIS — I428 Other cardiomyopathies: Secondary | ICD-10-CM | POA: Insufficient documentation

## 2022-07-30 LAB — CUP PACEART REMOTE DEVICE CHECK
Battery Remaining Longevity: 66 mo
Battery Remaining Percentage: 63 %
Battery Voltage: 2.98 V
Brady Statistic RV Percent Paced: 1 %
Date Time Interrogation Session: 20231114020017
HighPow Impedance: 99 Ohm
HighPow Impedance: 99 Ohm
Lead Channel Impedance Value: 510 Ohm
Lead Channel Pacing Threshold Amplitude: 0.75 V
Lead Channel Pacing Threshold Pulse Width: 0.5 ms
Lead Channel Sensing Intrinsic Amplitude: 12 mV
Lead Channel Setting Pacing Amplitude: 2.5 V
Lead Channel Setting Pacing Pulse Width: 0.5 ms
Lead Channel Setting Sensing Sensitivity: 0.5 mV
Pulse Gen Serial Number: 9824800
Zone Setting Status: 755011

## 2022-07-31 ENCOUNTER — Telehealth: Payer: Self-pay | Admitting: Family Medicine

## 2022-07-31 ENCOUNTER — Encounter (HOSPITAL_COMMUNITY): Payer: Commercial Managed Care - HMO

## 2022-07-31 DIAGNOSIS — R200 Anesthesia of skin: Secondary | ICD-10-CM

## 2022-07-31 NOTE — Progress Notes (Incomplete)
PCP: Dr. Nolen Mu Cardiology: Dr. Allyson Sabal HF Cardiology: Dr. Shirlee Latch  61 y.o. with history of nonischemic cardiomyopathy and OSA was referred by Dr. Allyson Sabal for evaluation of CHF.  He was initially diagnosed in early 2/18.  He had URI symptoms then developed dyspnea and put on weight. Echo was done in 2/18, showing EF 20-25%.  LHC in 2/18 showed normal coronaries. He was started on treatment for cardiomyopathy, and EF rose to 40% by 6/18. However, echo in 6/19 showed EF back down to 15-20% with severe RV dilation/dysfunction.  CPX was done in 7/19, showed moderate functional limitation though submaximal.  He had St Jude ICD placed in 8/19.   Atrial fibrillation noted in 12/20, he had TEE-guided DCCV in 12/20.  He remains in NSR today and has not felt palpitations.  EF on TEE was 25-30%.   He has been a heavy drinker but has cut back.  Still drinking on occasion, however.   He had left shoulder replacement in 2/21.  He developed a surgical site infection that is now resolved.    Echo in 4/22 showed EF 25-30%, mild LV dilation, normal RV.   He had THR in 8/22.   VT in 4/23, terminated by ATP.   Echo 01/2022  EF 30-35%, global HK, mild LVH, normal RV.   Today he returns for 3 month HF follow up.He had a fall going to physical therapy. Says he didn't have breakfast and thinks contributed.  Overall feeling fine. Having ongoing back pain. Denies SOB/PND/Orthopnea. Appetite ok. No fever or chills. Drinking 2 beers a day. Weight at home 265 pounds. Taking all medications.  St Jude device interrogation:  Impedance stable. No VT/VF   Labs (7/19): K 4.9, creatinine 0.91 Labs (8/19): Na 128, K 4.9, creatinine 0.72 Labs (9/19): LDL 45 Labs (12/19): K 3.9, creatinine 0.94  Labs (1/20): K 4.5, creatinine 0.94, hgb 13.2 Labs (10/20): K 4.4, creatinine 0.99 Labs (12/20): K 4.6, creatinine 1.0 Labs (2/21): K 3.9, creatinine 0.86 Labs (4/21): K 4.9, creatinine 1.05 Labs (6/21): K 4, creatinine 0.94 Labs  (12/21): K 4.6, creatinine 1.0, LFTs normal, hgb 14.1, TGs 163, LDL 71 Labs (2/22): K 3.80m, creatinine 0.91, BNP 1130 Labs (8/22): K 4.8, creatinine 1.36 Labs (4/23): BNP 254, K 4.5, creatinine 1.13 Labs (02/04/2022): Creatinine 1.38   PMH: 1. OSA: uses CPAP.  2. Depression 3. Chronic systolic CHF: Nonischemic cardiomyopathy.  Diagnosed around 2/18.  - LHC (2/18): Normal coronaries.  - Echo (2/18): EF 20-25% - Echo (6/18): EF 40% - Echo (12/18): EF 35-40% - Echo (6/19): LV mildly dilated, EF 15-20%, RV severely dilated with severely decreased systolic function.  - CPX (7/19): peak VO2 16, VE/VCO2 slope 37, RER 1.03 => submaximal but probably moderate functional limitation.  - Cardiac MRI (7/19): EF 29% with mild LV dilation, mildly decreased RV systolic function EF 40%, mid-wall LGE throughtout the septal wall and the inferor wall.  - St Jude ICD placed in 8/19.  - Echo (12/19): EF 25-30%, diffuse hypokinesis, normal RV size and systolic function.  - Echo (12/20): EF 25-30%, moderate LVH, normal RV size and systolic function.  - TEE (12/20): EF 25-30%, mildly decreased RV systolic function.  - Echo (4/22): EF 25-30%, mild LV dilation, normal RV. - Echo (5/23): EF 30-35%, global HK, mild LVH, normal RV. 4. ETOH abuse.  5. Atrial fibrillation: First noted in 12/20.  - DCCV to NSR in 12/20.  6. Left shoulder OA 7. THR 8/22  SH: Originally from [place], divorced  with 3 kids, works in sales/logistics, never smoked, h/o ETOH abuse. No drugs. Lives in [place].   FH: Sister with congenital heart abnormality.  No other cardiac disease.   ROS: All systems reviewed and negative except as per HPI.  Current Outpatient Medications  Medication Sig Dispense Refill   carvedilol (COREG) 25 MG tablet Take 25 mg by mouth 2 (two) times daily.     diphenhydramine-acetaminophen (TYLENOL PM) 25-500 MG TABS tablet Take 2 tablets by mouth at bedtime.     docusate sodium (COLACE) 100 MG capsule Take 1  capsule (100 mg total) by mouth 2 (two) times daily. (Patient not taking: Reported on 07/24/2022) 10 capsule 0   ELIQUIS 5 MG TABS tablet TAKE 1 TABLET BY MOUTH TWICE A DAY 180 tablet 3   ENTRESTO 97-103 MG TAKE 1 TABLET BY MOUTH TWICE A DAY 60 tablet 5   escitalopram (LEXAPRO) 10 MG tablet TAKE 1 TABLET BY MOUTH EVERY DAY 90 tablet 2   FARXIGA 10 MG TABS tablet TAKE 1 TABLET BY MOUTH EVERY DAY BEFORE BREAKFAST 90 tablet 3   fluticasone (FLONASE) 50 MCG/ACT nasal spray Place 1 spray into both nostrils daily. Use twice a day until symptoms under control, then reduce to daily use. 16 g 2   hydrOXYzine (ATARAX) 25 MG tablet Take 25 mg by mouth as needed.     ketoconazole (NIZORAL) 2 % cream Apply 1 Application topically daily. To all rash areas- also pending dermatology visit. Use for at least 2 weeks and at least a week after resolution. (Patient not taking: Reported on 07/24/2022) 60 g 1   rosuvastatin (CRESTOR) 20 MG tablet TAKE 1 TABLET BY MOUTH EVERY DAY 90 tablet 3   sildenafil (VIAGRA) 100 MG tablet TAKE 0.5 TABLETS (50 MG TOTAL) BY MOUTH AT BEDTIME AS NEEDED FOR ERECTILE DYSFUNCTION. 10 tablet 3   spironolactone (ALDACTONE) 50 MG tablet Take 0.5 tablets (25 mg total) by mouth daily. 45 tablet 2   torsemide (DEMADEX) 20 MG tablet Take 2 tablets (40 mg total) by mouth daily. 180 tablet 2   traZODone (DESYREL) 50 MG tablet TAKE 1 TABLET BY MOUTH EVERY DAY AT BEDTIME AS NEEDED 90 tablet 3   No current facility-administered medications for this visit.   Wt Readings from Last 3 Encounters:  07/24/22 120 kg (264 lb 9.6 oz)  07/15/22 120.5 kg (265 lb 9.6 oz)  07/03/22 122.9 kg (271 lb)    There were no vitals taken for this visit. General:  Well appearing. No resp difficulty. Walked in the clinic.  HEENT: normal Neck: supple. no JVD. Carotids 2+ bilat; no bruits. No lymphadenopathy or thryomegaly appreciated. Cor: PMI nondisplaced. Regular rate & rhythm. No rubs, gallops or murmurs. Lungs:  clear Abdomen: soft, nontender, nondistended. No hepatosplenomegaly. No bruits or masses. Good bowel sounds. Extremities: no cyanosis, clubbing, rash, edema Neuro: alert & orientedx3, cranial nerves grossly intact. moves all 4 extremities w/o difficulty. Affect pleasant g or cyanosis.  HEENT: Normal.   Assessment/Plan: 1. Chronic systolic CHF: Nonischemic cardiomyopathy with biventricular failure.  Cath in 2/18 with no coronary disease.  Echo (6/19) with EF 15-20%, severe RV dilation/dysfunction.  CPX 7/19 submaximal but probably moderate functional limitation.  Cardiac MRI in 7/19 showed EF 29%.  LGE pattern was concerning for prior viral myocarditis. Echo 01/2022 showed EF 30-35%, global HK, mild LVH, normal RV.  Has St Jude ICD.  Cause of CMP suspected to be viral myocarditis versus ETOH.  No family history of cardiomyopathy.   -  St Jude - not suggestive of fluid overload.  -NYHA II. Volume status stable.  - On GDMT.  - Continue torsemide 60 mg daily.  - Continue dapagliflozin 10 mg daily.  - Continue spironolactone to 50 mg daily  - Continue Coreg 25 mg bid. - Continue Entresto 97/103 bid.  - He needs to stop ETOH => suspect this is either the cause or a contributor to his cardiomyopathy.    - Check BMET today  - CPX in future, cannot do yet given significant low back pain.   - next visit consider Impulse CCM. Has narrow QRS.  2. OSA: Just starting using CPAP.   3. Anxiety/depression: Seeing a psychiatrist.  4. ETOH Abuse: This is likely causing/contributing to his cardiomyopathy and likely played a role in triggering atrial fibrillation.   - Continue to drink a few beers a day.  - Discussed cessation.   5. Atrial fibrillation: Paroxysmal, remains in NSR after DCCV in 12/20.  - Continue Eliquis 5 mg bid.   - Cut back on ETOH. Discussed cessation.   - If atrial fibrillation returns, favor ablation.  6. VT: Episode of VT in 4/23 terminated by ATP.  No VT since then.   - Continue  Coreg.  Would not start amiodarone with single episode terminated by [facility].   Follow up in 3 months.   Anderson Malta Yanelle Sousa NP-C  07/31/2022

## 2022-07-31 NOTE — Telephone Encounter (Signed)
Patient states: - He visited Dr. Royann Shivers, cardiology on 07/24/22 due to experiencing numbness in both pinkies for a couple of weeks  - Cardiologist examined him and stated he should go to PCP and ask for a referral to neurology   Patient requests: -Referral to neurology   He is willing to come in for an OV if needed. Please advise.

## 2022-08-01 NOTE — Telephone Encounter (Signed)
Referral has been placed. 

## 2022-08-01 NOTE — Telephone Encounter (Signed)
Ov needed first or ok to place referral?

## 2022-08-01 NOTE — Telephone Encounter (Signed)
I'm ok with referral to neurology under neuropathy - but also more than happy to see him first to work through possible causes prior to that visit

## 2022-08-06 ENCOUNTER — Telehealth (HOSPITAL_COMMUNITY): Payer: Self-pay | Admitting: Emergency Medicine

## 2022-08-06 NOTE — Telephone Encounter (Signed)
Attempted to call patient regarding upcoming cardiac CT appointment. °Left message on voicemail with name and callback number °Cleston Lautner RN Navigator Cardiac Imaging °[facility] °[phone] Office °[phone] Cell ° °

## 2022-08-07 ENCOUNTER — Ambulatory Visit (HOSPITAL_COMMUNITY)
Admission: RE | Admit: 2022-08-07 | Discharge: 2022-08-07 | Disposition: A | Discharge status: Home / Self Care | Payer: Commercial Managed Care - HMO | Source: Ambulatory Visit | Attending: Cardiovascular Disease | Admitting: Cardiovascular Disease

## 2022-08-07 ENCOUNTER — Encounter (HOSPITAL_COMMUNITY): Payer: Self-pay

## 2022-08-07 DIAGNOSIS — R0602 Shortness of breath: Secondary | ICD-10-CM | POA: Diagnosis present

## 2022-08-07 DIAGNOSIS — I472 Ventricular tachycardia, unspecified: Secondary | ICD-10-CM | POA: Insufficient documentation

## 2022-08-07 MED ORDER — METOPROLOL TARTRATE 5 MG/5ML IV SOLN
INTRAVENOUS | Status: AC
  Filled 2022-08-07: qty 10

## 2022-08-07 MED ORDER — NITROGLYCERIN 0.4 MG SL SUBL
0.8000 mg | SUBLINGUAL_TABLET | Freq: Once | SUBLINGUAL | Status: AC
  Administered 2022-08-07: 0.8 mg via SUBLINGUAL

## 2022-08-07 MED ORDER — METOPROLOL TARTRATE 5 MG/5ML IV SOLN: 10.0000 mg | INTRAVENOUS | Status: DC | PRN

## 2022-08-07 MED ORDER — NITROGLYCERIN 0.4 MG SL SUBL
SUBLINGUAL_TABLET | SUBLINGUAL | Status: AC
  Filled 2022-08-07: qty 2

## 2022-08-07 NOTE — Progress Notes (Signed)
Patient here for CT heart scan. After receiving Nitroglycerin, patient heart rate in the 80s. Systolic blood pressure 86/52. Dr. Cristal Deer notified of heart rate and blood pressure and decision made by MD to cancel scan. Patient and wife notified of reason of cancellation and state understanding. CT heart navigator nurse made aware.

## 2022-08-12 ENCOUNTER — Ambulatory Visit: Payer: Commercial Managed Care - HMO | Admitting: Family Medicine

## 2022-08-20 ENCOUNTER — Ambulatory Visit: Payer: Commercial Managed Care - HMO | Admitting: Family Medicine

## 2022-08-22 ENCOUNTER — Encounter: Payer: Self-pay | Admitting: Family Medicine

## 2022-08-22 ENCOUNTER — Ambulatory Visit (INDEPENDENT_AMBULATORY_CARE_PROVIDER_SITE_OTHER): Payer: Commercial Managed Care - HMO | Admitting: Family Medicine

## 2022-08-22 VITALS — BP 118/70 | HR 82 | Temp 97.2°F | Ht 71.0 in | Wt 271.0 lb

## 2022-08-22 DIAGNOSIS — R202 Paresthesia of skin: Secondary | ICD-10-CM | POA: Diagnosis not present

## 2022-08-22 DIAGNOSIS — R739 Hyperglycemia, unspecified: Secondary | ICD-10-CM

## 2022-08-22 DIAGNOSIS — E7800 Pure hypercholesterolemia, unspecified: Secondary | ICD-10-CM

## 2022-08-22 DIAGNOSIS — G8929 Other chronic pain: Secondary | ICD-10-CM

## 2022-08-22 DIAGNOSIS — M542 Cervicalgia: Secondary | ICD-10-CM | POA: Diagnosis not present

## 2022-08-22 LAB — COMPREHENSIVE METABOLIC PANEL
ALT: 22 U/L (ref 0–53)
AST: 20 U/L (ref 0–37)
Albumin: 3.9 g/dL (ref 3.5–5.2)
Alkaline Phosphatase: 76 U/L (ref 39–117)
BUN: 18 mg/dL (ref 6–23)
CO2: 31 mEq/L (ref 19–32)
Calcium: 9 mg/dL (ref 8.4–10.5)
Chloride: 98 mEq/L (ref 96–112)
Creatinine, Ser: 1.31 mg/dL (ref 0.40–1.50)
GFR: 58.88 mL/min — ABNORMAL LOW (ref >60.00)
Glucose, Bld: 108 mg/dL — ABNORMAL HIGH (ref 70–99)
Potassium: 4.4 mEq/L (ref 3.5–5.1)
Sodium: 135 mEq/L (ref 135–145)
Total Bilirubin: 0.4 mg/dL (ref 0.2–1.2)
Total Protein: 6.1 g/dL (ref 6.0–8.3)

## 2022-08-22 LAB — CBC WITH DIFFERENTIAL/PLATELET
Basophils Absolute: 0.1 10*3/uL (ref 0.0–0.1)
Basophils Relative: 0.5 % (ref 0.0–3.0)
Eosinophils Absolute: 0.2 10*3/uL (ref 0.0–0.7)
Eosinophils Relative: 2.4 % (ref 0.0–5.0)
HCT: 39 % (ref 39.0–52.0)
Hemoglobin: 12.8 g/dL — ABNORMAL LOW (ref 13.0–17.0)
Lymphocytes Relative: 17.7 % (ref 12.0–46.0)
Lymphs Abs: 1.7 10*3/uL (ref 0.7–4.0)
MCHC: 32.8 g/dL (ref 30.0–36.0)
MCV: 101.9 fl — ABNORMAL HIGH (ref 78.0–100.0)
Monocytes Absolute: 0.9 10*3/uL (ref 0.1–1.0)
Monocytes Relative: 8.8 % (ref 3.0–12.0)
Neutro Abs: 6.9 10*3/uL (ref 1.4–7.7)
Neutrophils Relative %: 70.6 % (ref 43.0–77.0)
Platelets: 283 10*3/uL (ref 150.0–400.0)
RBC: 3.82 Mil/uL — ABNORMAL LOW (ref 4.22–5.81)
RDW: 15.2 % (ref 11.5–15.5)
WBC: 9.8 10*3/uL (ref 4.0–10.5)

## 2022-08-22 LAB — LIPID PANEL
Cholesterol: 144 mg/dL (ref 0–200)
HDL: 55.4 mg/dL (ref >39.00)
LDL Cholesterol: 48 mg/dL (ref 0–99)
NonHDL: 88.18
Total CHOL/HDL Ratio: 3
Triglycerides: 199 mg/dL — ABNORMAL HIGH (ref 0.0–149.0)
VLDL: 39.8 mg/dL (ref 0.0–40.0)

## 2022-08-22 LAB — TSH: TSH: 1.63 u[IU]/mL (ref 0.35–5.50)

## 2022-08-22 LAB — HEMOGLOBIN A1C: Hgb A1c MFr Bld: 6 % (ref 4.6–6.5)

## 2022-08-22 LAB — VITAMIN B12: Vitamin B-12: 245 pg/mL (ref 211–911)

## 2022-08-22 NOTE — Progress Notes (Signed)
Phone [phone] In person visit   Subjective:   John [name] is a 61 y.o. year old very pleasant male patient who presents for/with See problem oriented charting Chief Complaint  Patient presents with   referral    Pt wants a referral to neurology for both pinky's on both hands, they are numb on the tips    Past Medical History-  Patient Active Problem List   Diagnosis Date Noted   Hyponatremia 02/16/2021    Priority: High   Paroxysmal atrial fibrillation (HCC) 04/14/2020    Priority: High   S/P ICD (internal cardiac defibrillator) procedure 04/14/2020    Priority: High   Status post reverse total shoulder replacement, left 11/11/2019    Priority: High   Non-ischemic cardiomyopathy (HCC)     Priority: High   Chronic combined systolic and diastolic CHF, NYHA class 3 (HCC) 10/2016    Priority: High   Alcohol dependence in remission (HCC) 03/19/2016    Priority: High   Aortic atherosclerosis (HCC) 03/22/2021    Priority: Medium    GAD (generalized anxiety disorder) 05/31/2020    Priority: Medium    Major depression, recurrent, full remission (HCC) 04/15/2019    Priority: Medium    Hyperlipidemia 05/27/2018    Priority: Medium    Essential hypertension 11/08/2016    Priority: Medium    GI bleed 03/19/2016    Priority: Medium    ED (erectile dysfunction) 08/21/2015    Priority: Medium    Hyperglycemia 08/21/2015    Priority: Medium    Insomnia 08/21/2015    Priority: Medium    OSA (obstructive sleep apnea) 08/21/2015    Priority: Medium    Retained orthopedic hardware 11/20/2018    Priority: Low   Unspecified fracture of upper end of left humerus, initial encounter for closed fracture 09/03/2017    Priority: Low   S/P shoulder replacement 11/24/2015    Priority: Low   Allergic rhinitis 08/21/2015    Priority: Low   Degenerative joint disease of left hip 03/27/2015    Priority: Low   Closed fracture of distal end of left radius 02/01/2022    Priority: 1.    Lumbar spondylosis 01/16/2022    Priority: 1.   Osteoarthritis of right hip 05/03/2021    Priority: 1.   Osteoarthritis of left hip 05/03/2021    Priority: 1.   Thoracic compression fracture (HCC) 02/22/2021    Priority: 1.   Avascular necrosis of left humeral head (HCC) 11/20/2018    Priority: 1.   Left shoulder pain 11/04/2018    Priority: 1.    Medications- reviewed and updated Current Outpatient Medications  Medication Sig Dispense Refill   carvedilol (COREG) 25 MG tablet Take 25 mg by mouth 2 (two) times daily.     diphenhydramine-acetaminophen (TYLENOL PM) 25-500 MG TABS tablet Take 2 tablets by mouth at bedtime.     ELIQUIS 5 MG TABS tablet TAKE 1 TABLET BY MOUTH TWICE A DAY 180 tablet 3   ENTRESTO 97-103 MG TAKE 1 TABLET BY MOUTH TWICE A DAY 60 tablet 5   FARXIGA 10 MG TABS tablet TAKE 1 TABLET BY MOUTH EVERY DAY BEFORE BREAKFAST 90 tablet 3   fluticasone (FLONASE) 50 MCG/ACT nasal spray Place 1 spray into both nostrils daily. Use twice a day until symptoms under control, then reduce to daily use. 16 g 2   rosuvastatin (CRESTOR) 20 MG tablet TAKE 1 TABLET BY MOUTH EVERY DAY 90 tablet 3   sertraline (ZOLOFT) 100 MG tablet Take  100 mg by mouth daily.     sildenafil (VIAGRA) 100 MG tablet TAKE 0.5 TABLETS (50 MG TOTAL) BY MOUTH AT BEDTIME AS NEEDED FOR ERECTILE DYSFUNCTION. 10 tablet 3   spironolactone (ALDACTONE) 50 MG tablet Take 0.5 tablets (25 mg total) by mouth daily. 45 tablet 2   torsemide (DEMADEX) 20 MG tablet Take 2 tablets (40 mg total) by mouth daily. 180 tablet 2   traZODone (DESYREL) 50 MG tablet TAKE 1 TABLET BY MOUTH EVERY DAY AT BEDTIME AS NEEDED 90 tablet 3   No current facility-administered medications for this visit.     Objective:  BP 118/70   Pulse 82   Temp (!) 97.2 F (36.2 C)   Ht 5\' 11"  (1.803 m)   Wt 271 lb (122.9 kg)   SpO2 97%   BMI 37.80 kg/m  Gen: NAD, resting comfortably CV: RRR no murmurs rubs or gallops Lungs: CTAB no crackles,  wheeze, rhonchi Ext: no edema Skin: warm, dry Neuro: No weakened grip strength-reports mild numbness sensation over finger-intact to gross touch    Assessment and Plan   # Social update-getting married January 17! #HM- recommended RSV, already had flu shot and will need to give January 19 the date -Also discussed Shingrix but with holiday season he does not think this is the best time for him  # Bilateral pinky numbness/chronic neck pain S: Numbness at bilateral pinky fingers in the tips-denies numbness or tingling in other areas-started sometime after last visit in October.  Has chronic neck pain for over a year- plans to see chiropractor - No incontinence, no extremity weakness reported A/P: Bilateral pinky numbness/check B12, TSH.  With chronic neck pain also check cervical spine films -He asks about seeing neurology and we discussed potentially doing so but if significant arthritic issues may start with sports medicine - he is agreeable  # CHF  #hypertension S: medication: Carvedilol 25 mg twice daily, Entresto 97-103 mg, farxiga 10 mg, spironolactone 50 mg, torsemide 40 mg daily -No recent swelling or weight gain reported-same weight as October visit here BP Readings from Last 3 Encounters:  08/22/22 118/70  08/07/22 97/69  07/24/22 (!) 94/57  A/P: CHF-appears euvolemic-continue current medication Hypertension-controlled. Continue current medications.   #hyperlipidemia S: Medication:Rosuvastatin 20 mg daily Lab Results  Component Value Date   CHOL 138 09/24/2021   HDL 58 09/24/2021   LDLCALC 58 09/24/2021   TRIG 112 09/24/2021   CHOLHDL 2.4 09/24/2021   A/P: Patient wanted to update for blood work-lipid panel was ordered per his request   # Hyperglycemia/insulin resistance/prediabetes S:  Medication: None Exercise and diet- not exercising due to back pain- discussed water walking or recumbent bike (twice a week for 5 mins)- encouraged aily and then move up Lab Results   Component Value Date   HGBA1C 5.5 10/12/2021   HGBA1C 6.4 (H) 05/03/2021   HGBA1C 5.5 04/21/2020   A/P: if a1c elevated- he is interested in ozempic to try to help with this and weight loss.-We also discussed importance of diet and exercise  # Anxiety S:Medication:  sertraline 100mg .  Off of Lexapro No SI A/P: He reports reasonable control-continue current medications  Recommended follow up: Return for next already scheduled visit or sooner if needed. Future Appointments  Date Time Provider Department Center  08/28/2022 12:00 PM , MD [facility]  08/29/2022 10:00 AM [facility] PA/NP [facility] None  09/11/2022 12:00 PM [facility] 4 [facility]  09/12/2022  9:30 AM [facility], MD [facility] None  10/29/2022  7:00 AM [facility] DEVICE REMOTES [facility]  11/15/2022  2:00 PM Shelva Majestic, MD [facility]  12/09/2022  1:20 PM Croitoru, Rachelle Hora, MD [facility] None  01/28/2023  7:00 AM [facility] DEVICE REMOTES [facility]  04/29/2023  7:00 AM [facility] DEVICE REMOTES [facility]  07/29/2023  7:00 AM [facility] DEVICE REMOTES [facility]  10/28/2023  7:00 AM [facility] DEVICE REMOTES [facility]    Lab/Order associations: Did not check fasting status-assume nonfasting   ICD-10-CM   1. Hyperglycemia  R73.9 HgB A1c    2. Pure hypercholesterolemia  E78.00 CBC with Differential/Platelet    Comprehensive metabolic panel    Lipid panel    TSH    3. Paresthesias  R20.2 TSH    Vitamin B12    DG Cervical Spine Complete    4. Chronic neck pain  M54.2 DG Cervical Spine Complete   G89.29      Return precautions advised.  [name], MD

## 2022-08-22 NOTE — Patient Instructions (Addendum)
Send [place] the date of flu shot and location and we will log  RSV at your pharmacy -if its a lighter week for you- consider shingrix (shingrix is typically tougher of the 2 and requires 2 dose series)  Please stop by lab before you go If you have mychart- we will send your results within 3 business days of [place] receiving them.  If you do not have mychart- we will call you about results within 5 business days of [place] receiving them.  *please also note that you will see labs on mychart as soon as they post. I will later go in and write notes on them- will say "notes from Dr. Durene Cal"   Please go to [facility] (updated 11/11/2019) - located [address] across the [address] from [place] - in the basement - Hours: 8:30-5:00 PM M-F (with lunch from 12:30- 1 PM). You do NOT need an appointment.    After x-rays and labs- we discussed either sports medicine consult if it looks like there is a lot of arthritis in the neck vs neurology if no arthritis changes that could be causing downstream affects into the fingers.    Recommended follow up: Return for next already scheduled visit or sooner if needed.

## 2022-08-27 NOTE — Progress Notes (Signed)
Remote ICD transmission.   

## 2022-08-28 ENCOUNTER — Ambulatory Visit: Payer: Commercial Managed Care - HMO | Admitting: Internal Medicine

## 2022-08-28 DIAGNOSIS — I509 Heart failure, unspecified: Secondary | ICD-10-CM | POA: Insufficient documentation

## 2022-08-28 DIAGNOSIS — I472 Ventricular tachycardia, unspecified: Secondary | ICD-10-CM | POA: Insufficient documentation

## 2022-08-28 NOTE — Progress Notes (Incomplete)
PCP: Dr. Caprice Beaver Cardiology: Dr. Gwenlyn Found HF Cardiology: Dr. Aundra Dubin  61 y.o. with history of nonischemic cardiomyopathy and OSA was referred by Dr. Gwenlyn Found for evaluation of CHF.  He was initially diagnosed in early 2/18.  He had URI symptoms then developed dyspnea and put on weight. Echo was done in 2/18, showing EF 20-25%.  LHC in 2/18 showed normal coronaries. He was started on treatment for cardiomyopathy, and EF rose to 40% by 6/18. However, echo in 6/19 showed EF back down to 15-20% with severe RV dilation/dysfunction.  CPX was done in 7/19, showed moderate functional limitation though submaximal.  He had St Jude ICD placed in 8/19.   Atrial fibrillation noted in 12/20, he had TEE-guided DCCV in 12/20.  He remains in NSR today and has not felt palpitations.  EF on TEE was 25-30%.   He has been a heavy drinker but has cut back.  Still drinking on occasion, however.   He had left shoulder replacement in 2/21.  He developed a surgical site infection that is now resolved.    Echo in 4/22 showed EF 25-30%, mild LV dilation, normal RV.   He had THR in 8/22.   VT in 4/23, terminated by ATP.   Echo 01/2022  EF 30-35%, global HK, mild LVH, normal RV.   Seen by Dr. Sallyanne Kuster 07/24/22 for f/u after he received ICD shock for sustained VT on 11/05.  He had overindulged in ETOH. Interrogation also showed several episodes NSVT and frequent PVCs. All episodes had appearance of torsades that appeared to occur after long pauses following PVCs and long QT of subsequent ventricular paced beat. QT normal on ECG and electrolytes okay. Referred to EP to upgrade his ICD to dual-chamber ICD. Patient cancelled appt with EP yesterday, now scheduled for January.   Scheduled for Coronary CTA to r/o obstructive CAD as etiology for recent VT.   St Jude device interrogation:  ***  Labs (7/19): K 4.9, creatinine 0.91 Labs (8/19): Na 128, K 4.9, creatinine 0.72 Labs (9/19): LDL 45 Labs (12/19): K 3.9, creatinine 0.94   Labs (1/20): K 4.5, creatinine 0.94, hgb 13.2 Labs (10/20): K 4.4, creatinine 0.99 Labs (12/20): K 4.6, creatinine 1.0 Labs (2/21): K 3.9, creatinine 0.86 Labs (4/21): K 4.9, creatinine 1.05 Labs (6/21): K 4, creatinine 0.94 Labs (12/21): K 4.6, creatinine 1.0, LFTs normal, hgb 14.1, TGs 163, LDL 71 Labs (2/22): K 3.43m, creatinine 0.91, BNP 1130 Labs (8/22): K 4.8, creatinine 1.36 Labs (4/23): BNP 254, K 4.5, creatinine 1.13 Labs (02/04/2022): Creatinine 1.38   PMH: 1. OSA: uses CPAP.  2. Depression 3. Chronic systolic CHF: Nonischemic cardiomyopathy.  Diagnosed around 2/18.  - LHC (2/18): Normal coronaries.  - Echo (2/18): EF 20-25% - Echo (6/18): EF 40% - Echo (12/18): EF 35-40% - Echo (6/19): LV mildly dilated, EF 15-20%, RV severely dilated with severely decreased systolic function.  - CPX (7/19): peak VO2 16, VE/VCO2 slope 37, RER 1.03 => submaximal but probably moderate functional limitation.  - Cardiac MRI (7/19): EF 29% with mild LV dilation, mildly decreased RV systolic function EF [id number], mid-wall LGE throughtout the septal wall and the inferor wall.  - St Jude ICD placed in 8/19.  - Echo (12/19): EF 25-30%, diffuse hypokinesis, normal RV size and systolic function.  - Echo (12/20): EF 25-30%, moderate LVH, normal RV size and systolic function.  - TEE (12/20): EF 25-30%, mildly decreased RV systolic function.  - Echo (4/22): EF 25-30%, mild LV dilation, normal RV. - Echo (  5/23): EF 30-35%, global HK, mild LVH, normal RV. 4. ETOH abuse.  5. Atrial fibrillation: First noted in 12/20.  - DCCV to NSR in 12/20.  6. Left shoulder OA 7. THR 8/22  SH: Originally from [place], divorced with 3 kids, works in sales/logistics, never smoked, h/o ETOH abuse. No drugs. Lives in [place].   FH: Sister with congenital heart abnormality.  No other cardiac disease.   ROS: All systems reviewed and negative except as per HPI.  Current Outpatient Medications  Medication Sig Dispense Refill    carvedilol (COREG) 25 MG tablet Take 25 mg by mouth 2 (two) times daily.     diphenhydramine-acetaminophen (TYLENOL PM) 25-500 MG TABS tablet Take 2 tablets by mouth at bedtime.     ELIQUIS 5 MG TABS tablet TAKE 1 TABLET BY MOUTH TWICE A DAY 180 tablet 3   ENTRESTO 97-103 MG TAKE 1 TABLET BY MOUTH TWICE A DAY 60 tablet 5   FARXIGA 10 MG TABS tablet TAKE 1 TABLET BY MOUTH EVERY DAY BEFORE BREAKFAST 90 tablet 3   fluticasone (FLONASE) 50 MCG/ACT nasal spray Place 1 spray into both nostrils daily. Use twice a day until symptoms under control, then reduce to daily use. 16 g 2   rosuvastatin (CRESTOR) 20 MG tablet TAKE 1 TABLET BY MOUTH EVERY DAY 90 tablet 3   sertraline (ZOLOFT) 100 MG tablet Take 100 mg by mouth daily.     sildenafil (VIAGRA) 100 MG tablet TAKE 0.5 TABLETS (50 MG TOTAL) BY MOUTH AT BEDTIME AS NEEDED FOR ERECTILE DYSFUNCTION. 10 tablet 3   spironolactone (ALDACTONE) 50 MG tablet Take 0.5 tablets (25 mg total) by mouth daily. 45 tablet 2   torsemide (DEMADEX) 20 MG tablet Take 2 tablets (40 mg total) by mouth daily. 180 tablet 2   traZODone (DESYREL) 50 MG tablet TAKE 1 TABLET BY MOUTH EVERY DAY AT BEDTIME AS NEEDED 90 tablet 3   No current facility-administered medications for this visit.   Wt Readings from Last 3 Encounters:  08/22/22 122.9 kg (271 lb)  07/24/22 120 kg (264 lb 9.6 oz)  07/15/22 120.5 kg (265 lb 9.6 oz)    There were no vitals taken for this visit. General:  Well appearing. No resp difficulty. Walked in the clinic.  HEENT: normal Neck: supple. no JVD. Carotids 2+ bilat; no bruits. No lymphadenopathy or thryomegaly appreciated. Cor: PMI nondisplaced. Regular rate & rhythm. No rubs, gallops or murmurs. Lungs: clear Abdomen: soft, nontender, nondistended. No hepatosplenomegaly. No bruits or masses. Good bowel sounds. Extremities: no cyanosis, clubbing, rash, edema Neuro: alert & orientedx3, cranial nerves grossly intact. moves all 4 extremities w/o  difficulty. Affect pleasant g or cyanosis.  HEENT: Normal.   Assessment/Plan: 1. Chronic systolic CHF: Nonischemic cardiomyopathy with biventricular failure.  Cath in 2/18 with no coronary disease.  Echo (6/19) with EF 15-20%, severe RV dilation/dysfunction.  CPX 7/19 submaximal but probably moderate functional limitation.  Cardiac MRI in 7/19 showed EF 29%.  LGE pattern was concerning for prior viral myocarditis. Echo 01/2022 showed EF 30-35%, global HK, mild LVH, normal RV.  Has St Jude ICD.  Cause of CMP suspected to be viral myocarditis versus ETOH.  No family history of cardiomyopathy.   - St Jude - not suggestive of fluid overload.  -NYHA II. Volume status stable.  - On GDMT.  - Continue torsemide 60 mg daily.  - Continue dapagliflozin 10 mg daily.  - Continue spironolactone to 50 mg daily  - Continue Coreg 25 mg bid. -  Continue Entresto 97/103 bid.  - He needs to stop ETOH => suspect this is either the cause or a contributor to his cardiomyopathy.    - CPX in future, cannot do yet given significant low back pain.   2. OSA: Just starting using CPAP.   3. Anxiety/depression: Seeing a psychiatrist.  4. ETOH Abuse: This is likely causing/contributing to his cardiomyopathy and likely played a role in triggering atrial fibrillation and recent VT - Continue to drink a few beers a day.  - Discussed cessation.   5. Atrial fibrillation: Paroxysmal, remains in NSR after DCCV in 12/20.  - Continue Eliquis 5 mg bid.   - Cut back on ETOH. Discussed cessation.   - If atrial fibrillation returns, favor ablation.  6. VT: Episode of VT in 4/23 terminated by ATP.   - Multiple episodes of NSVT and episode of VT resulting in ICD shock on 11/05. All episodes appeared consistent with Torsades. Had increased ETOH intake likely leading to increased PVCs. Torsades thought to be related to long pauses following PVCs and long QT of subsequent Vpaced beat -Has been referred to EP to consider updating ICD to  dual-chamber device to allow atrial pacing   Follow up***  [facility], Sael Furches N PA-C 08/28/2022

## 2022-08-29 ENCOUNTER — Encounter (HOSPITAL_COMMUNITY): Payer: Commercial Managed Care - HMO

## 2022-08-29 ENCOUNTER — Telehealth (HOSPITAL_COMMUNITY): Payer: Self-pay | Admitting: Physician Assistant

## 2022-09-10 ENCOUNTER — Telehealth (HOSPITAL_COMMUNITY): Payer: Self-pay | Admitting: *Deleted

## 2022-09-10 ENCOUNTER — Other Ambulatory Visit (HOSPITAL_COMMUNITY): Payer: Self-pay | Admitting: Cardiology

## 2022-09-10 ENCOUNTER — Other Ambulatory Visit (HOSPITAL_COMMUNITY): Payer: Self-pay | Admitting: Cardiovascular Disease

## 2022-09-10 MED ORDER — METOPROLOL TARTRATE 100 MG PO TABS: ORAL_TABLET | ORAL | 0 refills | Status: DC

## 2022-09-10 NOTE — Telephone Encounter (Signed)
Attempted to call patient regarding upcoming cardiac CT appointment. °Left message on voicemail with name and callback number ° °Terease Marcotte RN Navigator Cardiac Imaging °[facility] °[phone] Office °[phone] Cell ° °

## 2022-09-10 NOTE — Telephone Encounter (Signed)
Reaching out to patient to offer assistance regarding upcoming cardiac imaging study; pt verbalizes understanding of appt date/time, parking situation and where to check in, pre-test NPO status and medications ordered, and verified current allergies; name and call back number provided for further questions should they arise  Larey Brick RN Navigator Cardiac Imaging [facility] [phone] office [phone] cell  Patient to hold his daily BP medications and take 100mg  metoprolol tartrate TWO hours prior to his cardiac CT scan.  He is aware to arrive at 11:30am.

## 2022-09-11 ENCOUNTER — Ambulatory Visit (HOSPITAL_COMMUNITY)
Admission: RE | Admit: 2022-09-11 | Discharge: 2022-09-11 | Disposition: A | Discharge status: Home / Self Care | Payer: Commercial Managed Care - HMO | Source: Ambulatory Visit | Attending: Cardiovascular Disease | Admitting: Cardiovascular Disease

## 2022-09-11 NOTE — Progress Notes (Signed)
Patient in A.Fib on arrival for CT heart scan. Patient states he has a history of afib but has been in sinus rhythm for the past two years. Dr. Servando Salina informed that patient is in a.fib and decision made by MD to cancel scan. Patient and wife made aware of reason of cancellation. CT heart navigator nurse made aware of cancellation.

## 2022-09-12 ENCOUNTER — Encounter (INDEPENDENT_AMBULATORY_CARE_PROVIDER_SITE_OTHER): Payer: Commercial Managed Care - HMO | Admitting: Internal Medicine

## 2022-09-18 ENCOUNTER — Encounter (INDEPENDENT_AMBULATORY_CARE_PROVIDER_SITE_OTHER): Payer: Self-pay | Admitting: Family Medicine

## 2022-09-20 ENCOUNTER — Telehealth: Payer: Self-pay | Admitting: Family Medicine

## 2022-09-20 DIAGNOSIS — Z1283 Encounter for screening for malignant neoplasm of skin: Secondary | ICD-10-CM

## 2022-09-20 NOTE — Telephone Encounter (Signed)
Please advise if ok to send in referral.  

## 2022-09-20 NOTE — Telephone Encounter (Signed)
Patient would like a referral to be sent to  [facility] [place] he does not want to go to [facility] dermatology.

## 2022-09-21 NOTE — Telephone Encounter (Signed)
Yes thanks- if there was recent referral you can reorder under that name. If not can order under screening for colon cancer

## 2022-09-23 NOTE — Telephone Encounter (Signed)
Referral has been placed to requested location.

## 2022-09-24 ENCOUNTER — Encounter (INDEPENDENT_AMBULATORY_CARE_PROVIDER_SITE_OTHER): Payer: Self-pay | Admitting: Internal Medicine

## 2022-09-26 ENCOUNTER — Ambulatory Visit (HOSPITAL_COMMUNITY)
Admission: RE | Admit: 2022-09-26 | Discharge: 2022-09-26 | Disposition: A | Discharge status: Home / Self Care | Payer: Commercial Managed Care - HMO | Source: Ambulatory Visit | Attending: Adult Health | Admitting: Adult Health

## 2022-09-26 VITALS — BP 120/88 | HR 89 | Wt 271.0 lb

## 2022-09-26 DIAGNOSIS — I5022 Chronic systolic (congestive) heart failure: Secondary | ICD-10-CM | POA: Diagnosis not present

## 2022-09-26 DIAGNOSIS — F32A Depression, unspecified: Secondary | ICD-10-CM | POA: Insufficient documentation

## 2022-09-26 DIAGNOSIS — M545 Low back pain, unspecified: Secondary | ICD-10-CM | POA: Diagnosis not present

## 2022-09-26 DIAGNOSIS — I480 Paroxysmal atrial fibrillation: Secondary | ICD-10-CM | POA: Diagnosis not present

## 2022-09-26 DIAGNOSIS — G4733 Obstructive sleep apnea (adult) (pediatric): Secondary | ICD-10-CM | POA: Diagnosis not present

## 2022-09-26 DIAGNOSIS — I472 Ventricular tachycardia, unspecified: Secondary | ICD-10-CM

## 2022-09-26 DIAGNOSIS — Z7901 Long term (current) use of anticoagulants: Secondary | ICD-10-CM | POA: Insufficient documentation

## 2022-09-26 DIAGNOSIS — I428 Other cardiomyopathies: Secondary | ICD-10-CM

## 2022-09-26 DIAGNOSIS — I5042 Chronic combined systolic (congestive) and diastolic (congestive) heart failure: Secondary | ICD-10-CM

## 2022-09-26 DIAGNOSIS — F1010 Alcohol abuse, uncomplicated: Secondary | ICD-10-CM

## 2022-09-26 DIAGNOSIS — I5082 Biventricular heart failure: Secondary | ICD-10-CM | POA: Insufficient documentation

## 2022-09-26 DIAGNOSIS — F419 Anxiety disorder, unspecified: Secondary | ICD-10-CM | POA: Diagnosis not present

## 2022-09-26 DIAGNOSIS — Z95810 Presence of automatic (implantable) cardiac defibrillator: Secondary | ICD-10-CM | POA: Diagnosis not present

## 2022-09-26 DIAGNOSIS — Z79899 Other long term (current) drug therapy: Secondary | ICD-10-CM | POA: Insufficient documentation

## 2022-09-26 NOTE — Patient Instructions (Signed)
It was great to see you today! No medication changes are needed at this time.   Labs today We will only contact you if something comes back abnormal or we need to make some changes. Otherwise no news is good news!  Your physician recommends that you schedule a follow-up appointment in: 4-6 weeks with Dr Aundra Dubin  Do the following things EVERYDAY: Weigh yourself in the morning before breakfast. Write it down and keep it in a log. Take your medicines as prescribed Eat low salt foods--Limit salt (sodium) to 2000 mg per day.  Stay as active as you can everyday Limit all fluids for the day to less than 2 liters  At the [facility], you and your health needs are our priority. As part of our continuing mission to provide you with exceptional heart care, we have created designated Provider Care Teams. These Care Teams include your primary Cardiologist (physician) and Advanced Practice Providers (APPs- Physician Assistants and Nurse Practitioners) who all work together to provide you with the care you need, when you need it.   You may see any of the following providers on your designated Care Team at your next follow up: Dr Glori Bickers Dr Loralie Champagne Dr. Roxana Hires, NP Lyda Jester, [place] [facility] [place], [place] Forestine Na, NP Audry Riles, PharmD   Please be sure to bring in all your medications bottles to every appointment.   If you have any questions or concerns before your next appointment please send [place] a message through [place] or call our office at [phone].    TO LEAVE A MESSAGE FOR THE NURSE SELECT OPTION 2, PLEASE LEAVE A MESSAGE INCLUDING: YOUR NAME DATE OF BIRTH CALL BACK NUMBER REASON FOR CALL**this is important as we prioritize the call backs  YOU WILL RECEIVE A CALL BACK THE SAME DAY AS LONG AS YOU CALL BEFORE 4:00 PM

## 2022-09-26 NOTE — Progress Notes (Signed)
PCP: Dr. Caprice Beaver Cardiology: Dr. Gwenlyn Found EP Dr Sallyanne Kuster HF Cardiology: Dr. Aundra Dubin   62 y.o. with history of nonischemic cardiomyopathy and OSA was referred by Dr. Gwenlyn Found for evaluation of CHF.  He was initially diagnosed in early 2/18.  He had URI symptoms then developed dyspnea and put on weight. Echo was done in 2/18, showing EF 20-25%.  LHC in 2/18 showed normal coronaries. He was started on treatment for cardiomyopathy, and EF rose to 40% by 6/18. However, echo in 6/19 showed EF back down to 15-20% with severe RV dilation/dysfunction.  CPX was done in 7/19, showed moderate functional limitation though submaximal.  He had St Jude ICD placed in 8/19.   Atrial fibrillation noted in 12/20, he had TEE-guided DCCV in 12/20.  He remains in NSR today and has not felt palpitations.  EF on TEE was 25-30%.   He has been a heavy drinker but has cut back.  Still drinking on occasion, however.   He had left shoulder replacement in 2/21.  He developed a surgical site infection that is now resolved.    Echo in 4/22 showed EF 25-30%, mild LV dilation, normal RV.   He had THR in 8/22.   VT in 4/23, terminated by ATP.   Echo 01/2022  EF 30-35%, global HK, mild LVH, normal RV.   Saw Dr Sallyanne Kuster on 07/24/22. Devic interrogation  showed 4 episodes of ventricular tachycardia, 3 of them nonsustained, all within the last 4-5 weeks (2 episodes on October 11, 1 episode on October 16, the sustained episode leading to shock on November 5 and had drinking 6 -7 beers.  All of these have a pattern of torsades de pointes. He was set up for coronary CT but deferred to due to A fib.   Today he returns for HF follow up.Overall feeling fine. Limited by back pain.  Denies SOB/PND/Orthopnea. Appetite ok. No fever or chills. Weight at home stable. He has not uses CPAP in 4 weeks. Taking all medications. He has not missed any doses of eliquis. Lives with his girlfriend and plans to get married next week.    Labs (7/19): K 4.9,  creatinine 0.91 Labs (8/19): Na 128, K 4.9, creatinine 0.72 Labs (9/19): LDL 45 Labs (12/19): K 3.9, creatinine 0.94  Labs (1/20): K 4.5, creatinine 0.94, hgb 13.2 Labs (10/20): K 4.4, creatinine 0.99 Labs (12/20): K 4.6, creatinine 1.0 Labs (2/21): K 3.9, creatinine 0.86 Labs (4/21): K 4.9, creatinine 1.05 Labs (6/21): K 4, creatinine 0.94 Labs (12/21): K 4.6, creatinine 1.0, LFTs normal, hgb 14.1, TGs 163, LDL 71 Labs (2/22): K 3.26m, creatinine 0.91, BNP 1130 Labs (8/22): K 4.8, creatinine 1.36 Labs (4/23): BNP 254, K 4.5, creatinine 1.13 Labs (02/04/2022): Creatinine 1.38  Labs (08/22/22): Creatinine 1.3 K 4.4   PMH: 1. OSA: uses CPAP.  2. Depression 3. Chronic systolic CHF: Nonischemic cardiomyopathy.  Diagnosed around 2/18.  - LHC (2/18): Normal coronaries.  - Echo (2/18): EF 20-25% - Echo (6/18): EF 40% - Echo (12/18): EF 35-40% - Echo (6/19): LV mildly dilated, EF 15-20%, RV severely dilated with severely decreased systolic function.  - CPX (7/19): peak VO2 16, VE/VCO2 slope 37, RER 1.03 => submaximal but probably moderate functional limitation.  - Cardiac MRI (7/19): EF 29% with mild LV dilation, mildly decreased RV systolic function EF 09%, mid-wall LGE throughtout the septal wall and the inferor wall.  - St Jude ICD placed in 8/19.  - Echo (12/19): EF 25-30%, diffuse hypokinesis, normal RV size and  systolic function.  - Echo (12/20): EF 25-30%, moderate LVH, normal RV size and systolic function.  - TEE (12/20): EF 25-30%, mildly decreased RV systolic function.  - Echo (4/22): EF 25-30%, mild LV dilation, normal RV. - Echo (5/23): EF 30-35%, global HK, mild LVH, normal RV. 4. ETOH abuse.  5. Atrial fibrillation: First noted in 12/20.  - DCCV to NSR in 12/20.  6. Left shoulder OA 7. 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 No drugs. Lives in [place].   FH: Sister with congenital heart  abnormality.  No other cardiac disease.   ROS: All systems reviewed and negative except as per HPI.  Current Outpatient Medications  Medication Sig Dispense Refill   carvedilol (COREG) 25 MG tablet Take 25 mg by mouth 2 (two) times daily.     diphenhydramine-acetaminophen (TYLENOL PM) 25-500 MG TABS tablet Take 2 tablets by mouth at bedtime.     ELIQUIS 5 MG TABS tablet TAKE 1 TABLET BY MOUTH TWICE A DAY 180 tablet 3   ENTRESTO 97-103 MG TAKE 1 TABLET BY MOUTH TWICE A DAY 60 tablet 5   FARXIGA 10 MG TABS tablet TAKE 1 TABLET BY MOUTH EVERY DAY BEFORE BREAKFAST 90 tablet 3   fluticasone (FLONASE) 50 MCG/ACT nasal spray Place 1 spray into both nostrils daily. Use twice a day until symptoms under control, then reduce to daily use. 16 g 2   rosuvastatin (CRESTOR) 20 MG tablet TAKE 1 TABLET BY MOUTH EVERY DAY 90 tablet 3   sertraline (ZOLOFT) 100 MG tablet Take 100 mg by mouth daily.     sildenafil (VIAGRA) 100 MG tablet TAKE 0.5 TABLETS (50 MG TOTAL) BY MOUTH AT BEDTIME AS NEEDED FOR ERECTILE DYSFUNCTION. 10 tablet 3   spironolactone (ALDACTONE) 50 MG tablet Take 0.5 tablets (25 mg total) by mouth daily. 45 tablet 2   torsemide (DEMADEX) 20 MG tablet Take 2 tablets (40 mg total) by mouth daily. 180 tablet 2   traZODone (DESYREL) 50 MG tablet TAKE 1 TABLET BY MOUTH EVERY DAY AT BEDTIME AS NEEDED 90 tablet 3   metoprolol tartrate (LOPRESSOR) 100 MG tablet TAKE TABLET TWO HOURS PRIOR TO YOUR CARDIAC CT SCAN. DO NOT TAKE YOUR DAILY BP MEDICATIONS. 2 tablet 0   No current facility-administered medications for this encounter.   Wt Readings from Last 3 Encounters:  09/26/22 122.9 kg (271 lb)  08/22/22 122.9 kg (271 lb)  07/24/22 120 kg (264 lb 9.6 oz)    BP 120/88   Pulse 89   Wt 122.9 kg (271 lb)   SpO2 98%   BMI 37.80 kg/m  General:  Well appearing. No resp difficulty. Walked in the clinic.  HEENT: normal Neck: supple. no JVD. Carotids 2+ bilat; no bruits. No lymphadenopathy or thryomegaly  appreciated. Cor: PMI nondisplaced. Irregular rate & rhythm. No rubs, gallops or murmurs. Lungs: clear Abdomen: soft, nontender, nondistended. No hepatosplenomegaly. No bruits or masses. Good bowel sounds. Extremities: no cyanosis, clubbing, rash, edema Neuro: alert & orientedx3, cranial nerves grossly intact. moves all 4 extremities w/o difficulty. Affect pleasant  EKG: A fib 96 bpm   Assessment/Plan: 1. Chronic systolic CHF: Nonischemic cardiomyopathy with biventricular failure.  Cath in 2/18 with no coronary disease.  Echo (6/19) with EF 15-20%, severe RV dilation/dysfunction.  CPX 7/19 submaximal but probably moderate functional limitation.  Cardiac MRI in 7/19 showed EF 29%.  LGE pattern was concerning for prior viral myocarditis. Echo 01/2022  showed EF 30-35%, global HK, mild LVH, normal RV.  Has St Jude ICD.  Cause of CMP suspected to be viral myocarditis versus ETOH.  No family history of cardiomyopathy.   - St Jude ICD.  -NYHA II. Functionally limited by back pain.  - On GDMT. Volume status appears stable.  - Continue torsemide 40 mg daily.  - Continue dapagliflozin 10 mg daily.  - Continue spironolactone to 25 mg daily  - Continue Coreg 25 mg bid. - Continue Entresto 97/103 bid.  - He needs to stop ETOH => suspect this is either the cause or a contributor to his cardiomyopathy.    - CPX in future, cannot do yet given significant low back pain.  Discussed today and he would like to hold off.  - ? Impulse CCM. Has narrow QRS. Has follow up with EP tomorrow.  2. OSA: Needs to use CPAP nightly.   3. Anxiety/depression: Seeing a psychiatrist.  4. ETOH Abuse: This is likely causing/contributing to his cardiomyopathy and likely played a role in triggering atrial fibrillation.   - Continue to drink a few beers a day. Discussed cessation.  5. Atrial fibrillation: Paroxysmal In A fib today. Rate controlled.  - Continue Eliquis 5 mg bid.   - May need to consider ablation.  - Has follow up  with EP tomorrow.  6. VT: Episode of VT in 4/23 terminated by ATP.  -VT 07/21/22 .  Labs were ok.  - Continue Coreg.   -Has follow uo with EP tomorrow to discuss AA.    Follow up next month with Dr Shirlee Latch next month. If he remains in A Fib will need cardioversion.    Deajah Erkkila NP-C  09/26/2022

## 2022-09-27 ENCOUNTER — Ambulatory Visit: Payer: Self-pay | Attending: Internal Medicine | Admitting: Student

## 2022-09-27 ENCOUNTER — Encounter: Payer: Self-pay | Admitting: Student

## 2022-09-27 VITALS — BP 114/70 | HR 81 | Ht 71.0 in | Wt 269.0 lb

## 2022-09-27 DIAGNOSIS — I5042 Chronic combined systolic (congestive) and diastolic (congestive) heart failure: Secondary | ICD-10-CM

## 2022-09-27 DIAGNOSIS — I472 Ventricular tachycardia, unspecified: Secondary | ICD-10-CM

## 2022-09-27 DIAGNOSIS — I428 Other cardiomyopathies: Secondary | ICD-10-CM

## 2022-09-27 DIAGNOSIS — I480 Paroxysmal atrial fibrillation: Secondary | ICD-10-CM

## 2022-09-27 DIAGNOSIS — G4733 Obstructive sleep apnea (adult) (pediatric): Secondary | ICD-10-CM

## 2022-09-27 LAB — CUP PACEART INCLINIC DEVICE CHECK
Battery Remaining Longevity: 66 mo
Brady Statistic RV Percent Paced: 0.32 %
Date Time Interrogation Session: 20240112113523
HighPow Impedance: 85.5 Ohm
Implantable Lead Connection Status: 753985
Implantable Lead Implant Date: 20190821
Implantable Lead Location: 753860
Implantable Lead Model: 7122
Implantable Pulse Generator Implant Date: 20190821
Lead Channel Impedance Value: 525 Ohm
Lead Channel Pacing Threshold Amplitude: 0.75 V
Lead Channel Pacing Threshold Amplitude: 0.75 V
Lead Channel Pacing Threshold Pulse Width: 0.5 ms
Lead Channel Pacing Threshold Pulse Width: 0.5 ms
Lead Channel Sensing Intrinsic Amplitude: 12 mV
Lead Channel Setting Pacing Amplitude: 2.5 V
Lead Channel Setting Pacing Pulse Width: 0.5 ms
Lead Channel Setting Sensing Sensitivity: 0.5 mV
Pulse Gen Serial Number: 9824800
Zone Setting Status: 755011

## 2022-09-27 NOTE — Progress Notes (Signed)
Electrophysiology Office Note Date: 09/27/2022  ID:  John [name], DOB Sep 08, 1961, MRN [id number]  PCP: Marin Olp, MD Primary Cardiologist: Loralie Champagne, MD Electrophysiologist: Dr. Sallyanne Kuster ->  Dr. Quentin Ore  CC: Routine ICD follow-up  John [name] is a 62 y.o. male seen today for None for EP follow up and consideration of device upgrade vs CCM vs atrial fibrillation ablation.   He has not had further ICD shocks.   Since last visit with Dr. Sallyanne Kuster he has been doing OK. He doesn't really notice when he is in AF. Gets around [facility]. Is limited by back pain. Lives with his fiance who helps with his medicines, planning on getting married next week. Hasn't missed any doses of Eliquis. Intermittently uses his CPAP. Drinks 3-6 beers (24 oz each) a day. He states 3, his fiance states 6 or more.   Device History: St. Jude Single Chamber ICD implanted 04/2018 for NICM History of appropriate therapy: Yes   Past Medical History:  Diagnosis Date   AICD (automatic cardioverter/defibrillator) present    St. Jude   Anxiety    Ativan   Chronic combined systolic and diastolic CHF, NYHA class 3 ([place]) 10/2016   Nonischemic cardiomyopathy. EF 20-25%.   Chronic left hip pain    Depression    history of   Dysrhythmia    A-FIB   GI bleed 03/2016   Headache    Hypertensive heart disease with combined systolic and diastolic congestive heart failure ([place]) 10/2016   Nonischemic cardiomyopathy ([place]) 10/2016   Echo with EF 20-25%. Cardiac catheterization with no CAD. LVEDP was 41 mmHg, PCWP 36 mmHg   Osteoarthritis    right shoulder   Right hand fracture    Seasonal allergies    Sleep apnea    wears a CPAP   Wears glasses     Current Outpatient Medications  Medication Instructions   carvedilol (COREG) 25 mg, Oral, 2 times daily   diphenhydramine-acetaminophen (TYLENOL PM) 25-500 MG TABS tablet 2 tablets, Oral, Daily at bedtime   ELIQUIS 5 MG TABS tablet TAKE 1 TABLET BY MOUTH  TWICE A DAY   ENTRESTO 97-103 MG TAKE 1 TABLET BY MOUTH TWICE A DAY   FARXIGA 10 MG TABS tablet TAKE 1 TABLET BY MOUTH EVERY DAY BEFORE BREAKFAST   fluticasone (FLONASE) 50 MCG/ACT nasal spray 1 spray, Each Nare, Daily, Use twice a day until symptoms under control, then reduce to daily use.   metoprolol tartrate (LOPRESSOR) 100 MG tablet TAKE TABLET TWO HOURS PRIOR TO YOUR CARDIAC CT SCAN. DO NOT TAKE YOUR DAILY BP MEDICATIONS.   rosuvastatin (CRESTOR) 20 MG tablet TAKE 1 TABLET BY MOUTH EVERY DAY   sertraline (ZOLOFT) 100 mg, Oral, Daily   sildenafil (VIAGRA) 50 mg, Oral, At bedtime PRN   spironolactone (ALDACTONE) 25 mg, Oral, Daily   torsemide (DEMADEX) 40 mg, Oral, Daily   traZODone (DESYREL) 50 MG tablet TAKE 1 TABLET BY MOUTH EVERY DAY AT BEDTIME AS NEEDED    Family History: Family History  Problem Relation Age of Onset   Dementia Mother        died at 71   Lung cancer Father        smoker   Congenital heart disease Sister        lived to 89    Healthy Brother    Healthy Sister    Prostate cancer Brother        possible cancer   Healthy Brother    Healthy Brother  Physical Exam: Vitals:   09/27/22 1056  BP: 114/70  Pulse: 81  SpO2: 95%  Weight: 269 lb (122 kg)  Height: 5' 11" (1.803 m)     GEN- The patient is well appearing, alert and oriented x 3 today.   HEENT: normocephalic, atraumatic Lungs- Clear to ausculation bilaterally, normal work of breathing.  Heart- Regular rate and rhythm, Irregularly irregular rate and rhythm rate and rhythm, no murmurs, rubs or gallops Extremities- Trace peripheral edema. no clubbing or cyanosis Skin- warm and dry, no rash or lesion; ICD pocket well healed Psych- euthymic mood, full affect  ICD interrogation- reviewed in detail today,  See PACEART report  EKG is not ordered today. EKG from 09/26/2022 reviewed which showed AF in 90s  Other studies Reviewed: Additional studies/ records that were reviewed today include:  Previous EP office notes.   Assessment and Plan:  1.  Chronic systolic dysfunction s/p St. Jude single chamber ICD  Echo 01/2022 LVEF 30-35% euvolemic today Stable on an appropriate medical regimen Normal ICD function See Pace Art report No changes today On great GDMT.   2. H/o VT In setting of short-long-short (VP) per device interrogations.  Discussed the option of adding an atrial lead to prevent long pauses after PVCs which seem to have been setting off his VT/NVST in the past. He is agreeable to this.  Will have cardioversion   3. Persistent atrial fibrillation On eliquis for CHA2DS2/VASc of at least 2.   ? Candidacy for ablation given Body mass index is 37.52 kg/m. And very heavy ETOH use.  Poor AAD options given same and CHF. Renal function is OK.  Plan to update Echo once back in Sinus.  Long term, weight loss and ETOH cessation will be crucial to maintaining NSR.   4. OSA  Encouraged nightly CPAP   5. Obesity Body mass index is 37.52 kg/m.  Encouraged lifestyle modification   6. ETOH abuse Encouraged gradually limiting and eventual cessation  Current medicines are reviewed at length with the patient today.     Disposition:   Follow up with Dr. Lambert in 4 weeks post cardioversion to discuss next best step. Consider updated echo once in sinus. Would defer CCM consideration until his current device and AF status are addressed.    Signed, Lionardo Haze Andrew Sheyann Sulton, PA-C  09/27/2022 10:56 AM  [facility] [address] [place] [address] [phone] (office) [phone] (fax) 

## 2022-09-27 NOTE — Patient Instructions (Signed)
Medication Instructions:  Your physician recommends that you continue on your current medications as directed. Please refer to the Current Medication list given to you today.  *If you need a refill on your cardiac medications before your next appointment, please call your pharmacy*   Lab Work: TODAY: BMET, CBC  If you have labs (blood work) drawn today and your tests are completely normal, you will receive your results only by: [place] (if you have MyChart) OR A paper copy in the mail If you have any lab test that is abnormal or we need to change your treatment, we will call you to review the results.   Follow-Up: At [facility], you and your health needs are our priority.  As part of our continuing mission to provide you with exceptional heart care, we have created designated Provider Care Teams.  These Care Teams include your primary Cardiologist (physician) and Advanced Practice Providers (APPs -  Physician Assistants and Nurse Practitioners) who all work together to provide you with the care you need, when you need it.   Your next appointment:   As scheduled  Other Instructions See letter for Cardioversion Instructions

## 2022-09-27 NOTE — H&P (View-Only) (Signed)
Electrophysiology Office Note Date: 09/27/2022  ID:  John [name], DOB Sep 08, 1961, MRN [id number]  PCP: Marin Olp, MD Primary Cardiologist: Loralie Champagne, MD Electrophysiologist: Dr. Sallyanne Kuster ->  Dr. Quentin Ore  CC: Routine ICD follow-up  John [name] is a 62 y.o. male seen today for None for EP follow up and consideration of device upgrade vs CCM vs atrial fibrillation ablation.   He has not had further ICD shocks.   Since last visit with Dr. Sallyanne Kuster he has been doing OK. He doesn't really notice when he is in AF. Gets around [facility]. Is limited by back pain. Lives with his fiance who helps with his medicines, planning on getting married next week. Hasn't missed any doses of Eliquis. Intermittently uses his CPAP. Drinks 3-6 beers (24 oz each) a day. He states 3, his fiance states 6 or more.   Device History: St. Jude Single Chamber ICD implanted 04/2018 for NICM History of appropriate therapy: Yes   Past Medical History:  Diagnosis Date   AICD (automatic cardioverter/defibrillator) present    St. Jude   Anxiety    Ativan   Chronic combined systolic and diastolic CHF, NYHA class 3 ([place]) 10/2016   Nonischemic cardiomyopathy. EF 20-25%.   Chronic left hip pain    Depression    history of   Dysrhythmia    A-FIB   GI bleed 03/2016   Headache    Hypertensive heart disease with combined systolic and diastolic congestive heart failure ([place]) 10/2016   Nonischemic cardiomyopathy ([place]) 10/2016   Echo with EF 20-25%. Cardiac catheterization with no CAD. LVEDP was 41 mmHg, PCWP 36 mmHg   Osteoarthritis    right shoulder   Right hand fracture    Seasonal allergies    Sleep apnea    wears a CPAP   Wears glasses     Current Outpatient Medications  Medication Instructions   carvedilol (COREG) 25 mg, Oral, 2 times daily   diphenhydramine-acetaminophen (TYLENOL PM) 25-500 MG TABS tablet 2 tablets, Oral, Daily at bedtime   ELIQUIS 5 MG TABS tablet TAKE 1 TABLET BY MOUTH  TWICE A DAY   ENTRESTO 97-103 MG TAKE 1 TABLET BY MOUTH TWICE A DAY   FARXIGA 10 MG TABS tablet TAKE 1 TABLET BY MOUTH EVERY DAY BEFORE BREAKFAST   fluticasone (FLONASE) 50 MCG/ACT nasal spray 1 spray, Each Nare, Daily, Use twice a day until symptoms under control, then reduce to daily use.   metoprolol tartrate (LOPRESSOR) 100 MG tablet TAKE TABLET TWO HOURS PRIOR TO YOUR CARDIAC CT SCAN. DO NOT TAKE YOUR DAILY BP MEDICATIONS.   rosuvastatin (CRESTOR) 20 MG tablet TAKE 1 TABLET BY MOUTH EVERY DAY   sertraline (ZOLOFT) 100 mg, Oral, Daily   sildenafil (VIAGRA) 50 mg, Oral, At bedtime PRN   spironolactone (ALDACTONE) 25 mg, Oral, Daily   torsemide (DEMADEX) 40 mg, Oral, Daily   traZODone (DESYREL) 50 MG tablet TAKE 1 TABLET BY MOUTH EVERY DAY AT BEDTIME AS NEEDED    Family History: Family History  Problem Relation Age of Onset   Dementia Mother        died at 71   Lung cancer Father        smoker   Congenital heart disease Sister        lived to 89    Healthy Brother    Healthy Sister    Prostate cancer Brother        possible cancer   Healthy Brother    Healthy Brother  Physical Exam: Vitals:   09/27/22 1056  BP: 114/70  Pulse: 81  SpO2: 95%  Weight: 269 lb (122 kg)  Height: 5\' 11"  (1.803 m)     GEN- The patient is well appearing, alert and oriented x 3 today.   HEENT: normocephalic, atraumatic Lungs- Clear to ausculation bilaterally, normal work of breathing.  Heart- Regular rate and rhythm, Irregularly irregular rate and rhythm rate and rhythm, no murmurs, rubs or gallops Extremities- Trace peripheral edema. no clubbing or cyanosis Skin- warm and dry, no rash or lesion; ICD pocket well healed Psych- euthymic mood, full affect  ICD interrogation- reviewed in detail today,  See PACEART report  EKG is not ordered today. EKG from 09/26/2022 reviewed which showed AF in 90s  Other studies Reviewed: Additional studies/ records that were reviewed today include:  Previous EP office notes.   Assessment and Plan:  1.  Chronic systolic dysfunction s/p St. Jude single chamber ICD  Echo 01/2022 LVEF 30-35% euvolemic today Stable on an appropriate medical regimen Normal ICD function See Pace Art report No changes today On great GDMT.   2. H/o VT In setting of short-long-short (VP) per device interrogations.  Discussed the option of adding an atrial lead to prevent long pauses after PVCs which seem to have been setting off his VT/NVST in the past. He is agreeable to this.  Will have cardioversion   3. Persistent atrial fibrillation On eliquis for CHA2DS2/VASc of at least 2.   ? Candidacy for ablation given Body mass index is 37.52 kg/m. And very heavy ETOH use.  Poor AAD options given same and CHF. Renal function is OK.  Plan to update Echo once back in Sinus.  Long term, weight loss and ETOH cessation will be crucial to maintaining NSR.   4. OSA  Encouraged nightly CPAP   5. Obesity Body mass index is 37.52 kg/m.  Encouraged lifestyle modification   6. ETOH abuse Encouraged gradually limiting and eventual cessation  Current medicines are reviewed at length with the patient today.     Disposition:   Follow up with Dr. Quentin Ore in 4 weeks post cardioversion to discuss next best step. Consider updated echo once in sinus. Would defer CCM consideration until his current device and AF status are addressed.    John Lefevre, PA-C  09/27/2022 10:56 AM  [facility] [address] [place] [address] [phone] (office) [phone] (fax)

## 2022-09-28 LAB — CBC
Hematocrit: 41.7 % (ref 37.5–51.0)
Hemoglobin: 12.9 g/dL — ABNORMAL LOW (ref 13.0–17.7)
MCH: 32.1 pg (ref 26.6–33.0)
MCHC: 30.9 g/dL — ABNORMAL LOW (ref 31.5–35.7)
MCV: 104 fL — ABNORMAL HIGH (ref 79–97)
Platelets: 319 10*3/uL (ref 150–450)
RBC: 4.02 x10E6/uL — ABNORMAL LOW (ref 4.14–5.80)
RDW: 13.3 % (ref 11.6–15.4)
WBC: 10.5 10*3/uL (ref 3.4–10.8)

## 2022-09-28 LAB — BASIC METABOLIC PANEL
BUN/Creatinine Ratio: 16 (ref 10–24)
BUN: 16 mg/dL (ref 8–27)
CO2: 22 mmol/L (ref 20–29)
Calcium: 9.2 mg/dL (ref 8.6–10.2)
Chloride: 98 mmol/L (ref 96–106)
Creatinine, Ser: 1.02 mg/dL (ref 0.76–1.27)
Glucose: 86 mg/dL (ref 70–99)
Potassium: 4.5 mmol/L (ref 3.5–5.2)
Sodium: 137 mmol/L (ref 134–144)
eGFR: 84 mL/min/{1.73_m2} (ref >59)

## 2022-10-03 ENCOUNTER — Encounter (INDEPENDENT_AMBULATORY_CARE_PROVIDER_SITE_OTHER): Payer: Self-pay | Admitting: Internal Medicine

## 2022-10-03 ENCOUNTER — Encounter (INDEPENDENT_AMBULATORY_CARE_PROVIDER_SITE_OTHER): Payer: Self-pay

## 2022-10-06 NOTE — Anesthesia Preprocedure Evaluation (Addendum)
Anesthesia Evaluation  Patient identified by MRN, date of birth, ID band Patient awake    Reviewed: Allergy & Precautions, NPO status , Patient's Chart, lab work & pertinent test results, reviewed documented beta blocker date and time   Airway Mallampati: III  TM Distance: >3 FB Neck ROM: Full    Dental no notable dental hx. (+) Teeth Intact, Dental Advisory Given   Pulmonary sleep apnea    Pulmonary exam normal breath sounds clear to auscultation       Cardiovascular hypertension, Pt. on home beta blockers and Pt. on medications +CHF  Normal cardiovascular exam+ dysrhythmias Atrial Fibrillation + Cardiac Defibrillator  Rhythm:Irregular Rate:Normal  TTE 2023  1. Left ventricular ejection fraction, by estimation, is 30 to 35%. The  left ventricle has moderately decreased function. The left ventricle  demonstrates global hypokinesis. There is mild left ventricular  hypertrophy. Left ventricular diastolic  parameters are consistent with Grade I diastolic dysfunction (impaired  relaxation). The average left ventricular global longitudinal strain is  -14.5 %. The global longitudinal strain is abnormal.   2. Right ventricular systolic function is normal. The right ventricular  size is normal.   3. Left atrial size was mildly dilated.   4. Right atrial size was mildly dilated.   5. The mitral valve is normal in structure. No evidence of mitral valve  regurgitation. No evidence of mitral stenosis.   6. The aortic valve is tricuspid. Aortic valve regurgitation is not  visualized. No aortic stenosis is present.   7. The inferior vena cava is normal in size with <50% respiratory  variability, suggesting right atrial pressure of 8 mmHg.     Neuro/Psych  Headaches PSYCHIATRIC DISORDERS Anxiety Depression       GI/Hepatic negative GI ROS, Neg liver ROS,,,  Endo/Other  negative endocrine ROS    Renal/GU negative Renal ROS  negative  genitourinary   Musculoskeletal negative musculoskeletal ROS (+)    Abdominal   Peds  Hematology negative hematology ROS (+)   Anesthesia Other Findings   Reproductive/Obstetrics                             Anesthesia Physical Anesthesia Plan  ASA: 3  Anesthesia Plan: General   Post-op Pain Management:    Induction: Intravenous  PONV Risk Score and Plan: Propofol infusion and Treatment may vary due to age or medical condition  Airway Management Planned: Natural Airway  Additional Equipment:   Intra-op Plan:   Post-operative Plan:   Informed Consent: I have reviewed the patients History and Physical, chart, labs and discussed the procedure including the risks, benefits and alternatives for the proposed anesthesia with the patient or authorized representative who has indicated his/her understanding and acceptance.     Dental advisory given  Plan Discussed with: CRNA  Anesthesia Plan Comments:        Anesthesia Quick Evaluation

## 2022-10-07 ENCOUNTER — Ambulatory Visit (HOSPITAL_BASED_OUTPATIENT_CLINIC_OR_DEPARTMENT_OTHER): Payer: Commercial Managed Care - HMO | Admitting: Anesthesiology

## 2022-10-07 ENCOUNTER — Other Ambulatory Visit: Payer: Self-pay | Admitting: Family Medicine

## 2022-10-07 ENCOUNTER — Ambulatory Visit (HOSPITAL_COMMUNITY): Payer: Commercial Managed Care - HMO | Admitting: Anesthesiology

## 2022-10-07 ENCOUNTER — Ambulatory Visit (HOSPITAL_COMMUNITY)
Admission: RE | Admit: 2022-10-07 | Discharge: 2022-10-07 | Disposition: A | Discharge status: Home / Self Care | Payer: Commercial Managed Care - HMO | Attending: Cardiology | Admitting: Cardiology

## 2022-10-07 ENCOUNTER — Encounter (HOSPITAL_COMMUNITY)
Admission: RE | Disposition: A | Discharge status: Home / Self Care | Payer: Self-pay | Source: Home / Self Care | Attending: Cardiology

## 2022-10-07 ENCOUNTER — Encounter (HOSPITAL_COMMUNITY): Payer: Self-pay | Admitting: Cardiology

## 2022-10-07 ENCOUNTER — Other Ambulatory Visit: Payer: Self-pay

## 2022-10-07 DIAGNOSIS — G4733 Obstructive sleep apnea (adult) (pediatric): Secondary | ICD-10-CM | POA: Diagnosis not present

## 2022-10-07 DIAGNOSIS — F418 Other specified anxiety disorders: Secondary | ICD-10-CM

## 2022-10-07 DIAGNOSIS — I4891 Unspecified atrial fibrillation: Secondary | ICD-10-CM | POA: Diagnosis not present

## 2022-10-07 DIAGNOSIS — Z7901 Long term (current) use of anticoagulants: Secondary | ICD-10-CM | POA: Insufficient documentation

## 2022-10-07 DIAGNOSIS — I5042 Chronic combined systolic (congestive) and diastolic (congestive) heart failure: Secondary | ICD-10-CM | POA: Diagnosis not present

## 2022-10-07 DIAGNOSIS — Z8249 Family history of ischemic heart disease and other diseases of the circulatory system: Secondary | ICD-10-CM | POA: Insufficient documentation

## 2022-10-07 DIAGNOSIS — E669 Obesity, unspecified: Secondary | ICD-10-CM | POA: Insufficient documentation

## 2022-10-07 DIAGNOSIS — I110 Hypertensive heart disease with heart failure: Secondary | ICD-10-CM

## 2022-10-07 DIAGNOSIS — Z6837 Body mass index (BMI) 37.0-37.9, adult: Secondary | ICD-10-CM | POA: Insufficient documentation

## 2022-10-07 DIAGNOSIS — F1010 Alcohol abuse, uncomplicated: Secondary | ICD-10-CM | POA: Insufficient documentation

## 2022-10-07 DIAGNOSIS — I480 Paroxysmal atrial fibrillation: Secondary | ICD-10-CM

## 2022-10-07 DIAGNOSIS — I509 Heart failure, unspecified: Secondary | ICD-10-CM | POA: Diagnosis not present

## 2022-10-07 DIAGNOSIS — I428 Other cardiomyopathies: Secondary | ICD-10-CM | POA: Insufficient documentation

## 2022-10-07 DIAGNOSIS — I4819 Other persistent atrial fibrillation: Secondary | ICD-10-CM | POA: Diagnosis present

## 2022-10-07 DIAGNOSIS — Z95810 Presence of automatic (implantable) cardiac defibrillator: Secondary | ICD-10-CM | POA: Diagnosis not present

## 2022-10-07 HISTORY — PX: CARDIOVERSION: SHX1299

## 2022-10-07 SURGERY — CARDIOVERSION
Anesthesia: General

## 2022-10-07 MED ORDER — SODIUM CHLORIDE 0.9 % IV SOLN: INTRAVENOUS | Status: DC

## 2022-10-07 MED ORDER — LIDOCAINE HCL (CARDIAC) PF 100 MG/5ML IV SOSY
PREFILLED_SYRINGE | INTRAVENOUS | Status: DC | PRN
  Administered 2022-10-07: 80 mg via INTRAVENOUS

## 2022-10-07 MED ORDER — PROPOFOL 10 MG/ML IV BOLUS
INTRAVENOUS | Status: DC | PRN
  Administered 2022-10-07: 100 mg via INTRAVENOUS

## 2022-10-07 NOTE — Anesthesia Postprocedure Evaluation (Signed)
Anesthesia Post Note  Patient: John [name]  Procedure(s) Performed: CARDIOVERSION     Patient location during evaluation: Endoscopy Anesthesia Type: General Level of consciousness: awake and alert Pain management: pain level controlled Vital Signs Assessment: post-procedure vital signs reviewed and stable Respiratory status: spontaneous breathing, nonlabored ventilation, respiratory function stable and patient connected to nasal cannula oxygen Cardiovascular status: blood pressure returned to baseline and stable Postop Assessment: no apparent nausea or vomiting Anesthetic complications: no  No notable events documented.  Last Vitals:  Vitals:   10/07/22 0840 10/07/22 0850  BP: 96/78 111/82  Pulse: 69 68  Resp: 17 20  Temp:    SpO2: 98% 97%    Last Pain:  Vitals:   10/07/22 0850  TempSrc:   PainSc: 0-No pain                 Eiman Maret L Janisse Ghan

## 2022-10-07 NOTE — CV Procedure (Signed)
   Electrical Cardioversion Procedure Note John [name] [id number] 25-Nov-1960  Procedure: Electrical Cardioversion Indications:  Atrial Fibrillation  Time Out: Verified patient identification, verified procedure,medications/allergies/relevent history reviewed, required imaging and test results available.  Performed  Procedure Details  The patient signed informed consent.   The patient was NPO past midnight. Has had therapeutic anticoagulation with Eliquis greater than 3 weeks. The patient denies any interruption of anticoagulation.  Anesthesia was administered by Dr. Valeria Batman.  Adequate airway was maintained throughout and vital followed per protocol.  He was cardioverted x 1 with 200 J of biphasic synchronized energy.  He converted to NSR.  There were no apparent complications.  The patient tolerated the procedure well and had normal neuro status and respiratory status post procedure with vitals stable as recorded elsewhere.     IMPRESSION:  Successful cardioversion of atrial fibrillation convert to sinus rhythm.   Follow up:  We will arrange follow up with primary cardiologist.  He will continue on current medical therapy.  The patient advised to continue anticoagulation.  John [name] 10/07/2022, 8:29 AM

## 2022-10-07 NOTE — Interval H&P Note (Signed)
History and Physical Interval Note:  10/07/2022 8:12 AM  John [name]  has presented today for surgery, with the diagnosis of afib.  The various methods of treatment have been discussed with the patient and family. After consideration of risks, benefits and other options for treatment, the patient has consented to  Procedure(s): CARDIOVERSION (N/A) as a surgical intervention.  The patient's history has been reviewed, patient examined, no change in status, stable for surgery.  I have reviewed the patient's chart and labs.  Questions were answered to the patient's satisfaction.     John [name]

## 2022-10-07 NOTE — Discharge Instructions (Signed)
Electrical Cardioversion °Electrical cardioversion is the delivery of a jolt of electricity to restore a normal rhythm to the heart. A rhythm that is too fast or is not regular keeps the heart from pumping well. In this procedure, sticky patches or metal paddles are placed on the chest to deliver electricity to the heart from a device. °This procedure may be done in an emergency if: °There is low or no blood pressure as a result of the heart rhythm. °Normal rhythm must be restored as fast as possible to protect the brain and heart from further damage. °It may save a life. °This may also be a scheduled procedure for irregular or fast heart rhythms that are not immediately life-threatening. ° °What can I expect after the procedure? °Your blood pressure, heart rate, breathing rate, and blood oxygen level will be monitored until you leave the hospital or clinic. °Your heart rhythm will be watched to make sure it does not change. °You may have some redness on the skin where the shocks were given. Over the counter cortizone cream may be helpful.  °Follow these instructions at home: °Do not drive for 24 hours if you were given a sedative during your procedure. °Take over-the-counter and prescription medicines only as told by your health care provider. °Ask your health care provider how to check your pulse. Check it often. °Rest for 48 hours after the procedure or as told by your health care provider. °Avoid or limit your caffeine use as told by your health care provider. °Keep all follow-up visits as told by your health care provider. This is important. °Contact a health care provider if: °You feel like your heart is beating too quickly or your pulse is not regular. °You have a serious muscle cramp that does not go away. °Get help right away if: °You have discomfort in your chest. °You are dizzy or you feel faint. °You have trouble breathing or you are short of breath. °Your speech is slurred. °You have trouble moving an  arm or leg on one side of your body. °Your fingers or toes turn cold or blue. °Summary °Electrical cardioversion is the delivery of a jolt of electricity to restore a normal rhythm to the heart. °This procedure may be done right away in an emergency or may be a scheduled procedure if the condition is not an emergency. °Generally, this is a safe procedure. °After the procedure, check your pulse often as told by your health care provider. °This information is not intended to replace advice given to you by your health care provider. Make sure you discuss any questions you have with your health care provider. °Document Revised: 04/05/2019 Document Reviewed: 04/05/2019 °Elsevier Patient Education © 2020 [employer]  °

## 2022-10-07 NOTE — Transfer of Care (Signed)
Immediate Anesthesia Transfer of Care Note  Patient: John [name]  Procedure(s) Performed: CARDIOVERSION  Patient Location: PACU  Anesthesia Type:General  Level of Consciousness: drowsy and patient cooperative  Airway & Oxygen Therapy: Patient Spontanous Breathing and Patient connected to nasal cannula oxygen  Post-op Assessment: Report given to RN and Post -op Vital signs reviewed and stable  Post vital signs: Reviewed and stable  Last Vitals:  Vitals Value Taken Time  BP    Temp    Pulse    Resp    SpO2      Last Pain:  Vitals:   10/07/22 0725  TempSrc: Temporal  PainSc: 3       Patients Stated Pain Goal: 3 (34/74/25 9563)  Complications: No notable events documented.

## 2022-10-08 ENCOUNTER — Encounter (HOSPITAL_COMMUNITY): Payer: Self-pay | Admitting: Cardiology

## 2022-10-10 ENCOUNTER — Other Ambulatory Visit (HOSPITAL_COMMUNITY): Payer: Self-pay | Admitting: Cardiology

## 2022-10-10 ENCOUNTER — Other Ambulatory Visit (HOSPITAL_COMMUNITY): Payer: Self-pay

## 2022-10-10 NOTE — Telephone Encounter (Addendum)
Advanced Heart Failure Patient Advocate Encounter  Called and spoke with patient. Confirmed that he is using [employer]. Wilder Glade goes through with $15 co-pay. Patient states he gets it for free through his local pharmacy.  Charlann Boxer, CPhT

## 2022-10-10 NOTE — Telephone Encounter (Signed)
No worries lol. I will look into it and see what I can do.   Thanks

## 2022-10-14 ENCOUNTER — Other Ambulatory Visit (HOSPITAL_COMMUNITY): Payer: Self-pay | Admitting: Cardiology

## 2022-10-16 ENCOUNTER — Encounter (INDEPENDENT_AMBULATORY_CARE_PROVIDER_SITE_OTHER): Payer: Self-pay | Admitting: Family Medicine

## 2022-10-17 ENCOUNTER — Encounter: Payer: Self-pay | Admitting: Bariatrics

## 2022-10-17 ENCOUNTER — Ambulatory Visit (INDEPENDENT_AMBULATORY_CARE_PROVIDER_SITE_OTHER): Payer: Managed Care, Other (non HMO) | Admitting: Bariatrics

## 2022-10-17 VITALS — BP 99/58 | HR 70 | Temp 98.9°F | Ht 70.0 in | Wt 267.0 lb

## 2022-10-17 DIAGNOSIS — R5381 Other malaise: Secondary | ICD-10-CM | POA: Diagnosis not present

## 2022-10-17 DIAGNOSIS — E669 Obesity, unspecified: Secondary | ICD-10-CM

## 2022-10-17 DIAGNOSIS — E7800 Pure hypercholesterolemia, unspecified: Secondary | ICD-10-CM

## 2022-10-17 DIAGNOSIS — R5383 Other fatigue: Secondary | ICD-10-CM

## 2022-10-17 DIAGNOSIS — R739 Hyperglycemia, unspecified: Secondary | ICD-10-CM

## 2022-10-17 DIAGNOSIS — Z6838 Body mass index (BMI) 38.0-38.9, adult: Secondary | ICD-10-CM

## 2022-10-23 ENCOUNTER — Ambulatory Visit (HOSPITAL_COMMUNITY)
Admission: RE | Admit: 2022-10-23 | Discharge: 2022-10-23 | Disposition: A | Discharge status: Home / Self Care | Payer: Managed Care, Other (non HMO) | Source: Ambulatory Visit | Attending: Cardiology | Admitting: Cardiology

## 2022-10-23 ENCOUNTER — Other Ambulatory Visit (HOSPITAL_COMMUNITY): Payer: Self-pay

## 2022-10-23 ENCOUNTER — Encounter (HOSPITAL_COMMUNITY): Payer: Self-pay | Admitting: Cardiology

## 2022-10-23 VITALS — BP 100/60 | HR 88 | Wt 274.4 lb

## 2022-10-23 DIAGNOSIS — Z79899 Other long term (current) drug therapy: Secondary | ICD-10-CM | POA: Diagnosis not present

## 2022-10-23 DIAGNOSIS — I428 Other cardiomyopathies: Secondary | ICD-10-CM | POA: Diagnosis not present

## 2022-10-23 DIAGNOSIS — Z6839 Body mass index (BMI) 39.0-39.9, adult: Secondary | ICD-10-CM | POA: Diagnosis not present

## 2022-10-23 DIAGNOSIS — Z7901 Long term (current) use of anticoagulants: Secondary | ICD-10-CM | POA: Insufficient documentation

## 2022-10-23 DIAGNOSIS — F419 Anxiety disorder, unspecified: Secondary | ICD-10-CM | POA: Insufficient documentation

## 2022-10-23 DIAGNOSIS — I5082 Biventricular heart failure: Secondary | ICD-10-CM | POA: Diagnosis not present

## 2022-10-23 DIAGNOSIS — E669 Obesity, unspecified: Secondary | ICD-10-CM | POA: Diagnosis not present

## 2022-10-23 DIAGNOSIS — I5042 Chronic combined systolic (congestive) and diastolic (congestive) heart failure: Secondary | ICD-10-CM | POA: Diagnosis not present

## 2022-10-23 DIAGNOSIS — M545 Low back pain, unspecified: Secondary | ICD-10-CM | POA: Diagnosis not present

## 2022-10-23 DIAGNOSIS — F32A Depression, unspecified: Secondary | ICD-10-CM | POA: Diagnosis not present

## 2022-10-23 DIAGNOSIS — G4733 Obstructive sleep apnea (adult) (pediatric): Secondary | ICD-10-CM | POA: Diagnosis not present

## 2022-10-23 DIAGNOSIS — Z95810 Presence of automatic (implantable) cardiac defibrillator: Secondary | ICD-10-CM | POA: Insufficient documentation

## 2022-10-23 DIAGNOSIS — I480 Paroxysmal atrial fibrillation: Secondary | ICD-10-CM | POA: Diagnosis not present

## 2022-10-23 DIAGNOSIS — I5022 Chronic systolic (congestive) heart failure: Secondary | ICD-10-CM | POA: Insufficient documentation

## 2022-10-23 DIAGNOSIS — F1010 Alcohol abuse, uncomplicated: Secondary | ICD-10-CM | POA: Diagnosis not present

## 2022-10-23 LAB — BASIC METABOLIC PANEL
Anion gap: 12 (ref 5–15)
BUN: 13 mg/dL (ref 8–23)
CO2: 27 mmol/L (ref 22–32)
Calcium: 9.1 mg/dL (ref 8.9–10.3)
Chloride: 95 mmol/L — ABNORMAL LOW (ref 98–111)
Creatinine, Ser: 0.99 mg/dL (ref 0.61–1.24)
GFR, Estimated: >60 mL/min (ref >60)
Glucose, Bld: 139 mg/dL — ABNORMAL HIGH (ref 70–99)
Potassium: 4 mmol/L (ref 3.5–5.1)
Sodium: 134 mmol/L — ABNORMAL LOW (ref 135–145)

## 2022-10-23 LAB — BRAIN NATRIURETIC PEPTIDE: B Natriuretic Peptide: 625 pg/mL — ABNORMAL HIGH (ref 0.0–100.0)

## 2022-10-23 MED ORDER — SPIRONOLACTONE 50 MG PO TABS: 25.0000 mg | ORAL_TABLET | Freq: Every day | ORAL | 2 refills | Status: DC

## 2022-10-23 NOTE — Patient Instructions (Signed)
Medication Changes:  Change Spironolactone to 25 mg daily AT BEDTIME  Lab Work:  Labs done today, your results will be available in MyChart, we will contact you for abnormal readings.  Testing/Procedures:  Your physician has requested that you have cardiac CT. Cardiac computed tomography (CT) is a painless test that uses an x-ray machine to take clear, detailed pictures of your heart. For further information please visit [email or web address]. Please follow instruction sheet as given. YOU WILL BE CALLED TO SCHEDULE THIS  Your physician has requested that you have an echocardiogram. Echocardiography is a painless test that uses sound waves to create images of your heart. It provides your doctor with information about the size and shape of your heart and how well your heart's chambers and valves are working. This procedure takes approximately one hour. There are no restrictions for this procedure. Please do NOT wear cologne, perfume, aftershave, or lotions (deodorant is allowed). Please arrive 15 minutes prior to your appointment time. IN 3 MONTHS  Referrals:  none  Special Instructions // Education:  Do the following things EVERYDAY: Weigh yourself in the morning before breakfast. Write it down and keep it in a log. Take your medicines as prescribed Eat low salt foods--Limit salt (sodium) to 2000 mg per day.  Stay as active as you can everyday Limit all fluids for the day to less than 2 liters  Wear CPAP every night  Follow-Up in: 3 months with echocardiogram (May 2024), **PLEASE CALL OUR OFFICE IN MARCH TO SCHEDULE THIS APPOINTMENT  At [facility], you and your health needs are our priority. We have a designated team specialized in the treatment of Heart Failure. This Care Team includes your primary Heart Failure Specialized Cardiologist (physician), Advanced Practice Providers (APPs- Physician Assistants and Nurse Practitioners), and Pharmacist who all work  together to provide you with the care you need, when you need it.   You may see any of the following providers on your designated Care Team at your next follow up:  Dr. Glori Bickers Dr. Loralie Champagne Dr. Roxana Hires, NP Lyda Jester, [place] [facility] [place], [place] Forestine Na, NP Audry Riles, PharmD   Please be sure to bring in all your medications bottles to every appointment.   Need to Contact us:  If you have any questions or concerns before your next appointment please send [place] a message through [place] or call our office at [phone].    TO LEAVE A MESSAGE FOR THE NURSE SELECT OPTION 2, PLEASE LEAVE A MESSAGE INCLUDING: YOUR NAME DATE OF BIRTH CALL BACK NUMBER REASON FOR CALL**this is important as we prioritize the call backs  YOU WILL RECEIVE A CALL BACK THE SAME DAY AS LONG AS YOU CALL BEFORE 4:00 PM

## 2022-10-24 ENCOUNTER — Other Ambulatory Visit (HOSPITAL_COMMUNITY): Payer: Self-pay | Admitting: Cardiovascular Disease

## 2022-10-24 DIAGNOSIS — R079 Chest pain, unspecified: Secondary | ICD-10-CM

## 2022-10-24 NOTE — Progress Notes (Signed)
PCP: Dr. Caprice Beaver Cardiology: Dr. Gwenlyn Found EP Dr Sallyanne Kuster HF Cardiology: Dr. Aundra Dubin   62 y.o. with history of nonischemic cardiomyopathy and OSA was referred by Dr. Gwenlyn Found for evaluation of CHF.  He was initially diagnosed in early 2/18.  He had URI symptoms then developed dyspnea and put on weight. Echo was done in 2/18, showing EF 20-25%.  LHC in 2/18 showed normal coronaries. He was started on treatment for cardiomyopathy, and EF rose to 40% by 6/18. However, echo in 6/19 showed EF back down to 15-20% with severe RV dilation/dysfunction.  CPX was done in 7/19, showed moderate functional limitation though submaximal.  He had St Jude ICD placed in 8/19.   Atrial fibrillation noted in 12/20, he had TEE-guided DCCV in 12/20.  He remains in NSR today and has not felt palpitations.  EF on TEE was 25-30%.   He has been a heavy drinker but has cut back.  Still drinking on occasion, however.   He had left shoulder replacement in 2/21.  He developed a surgical site infection that is now resolved.    Echo in 4/22 showed EF 25-30%, mild LV dilation, normal RV.   He had THR in 8/22.   VT in 4/23, terminated by ATP.   Echo 01/2022  EF 30-35%, global HK, mild LVH, normal RV.   Saw Dr Sallyanne Kuster on 07/24/22. Devic interrogation  showed 4 episodes of ventricular tachycardia, 3 of them nonsustained, all within the last 4-5 weeks.  2 episodes on October 11, 1 episode on October 16, the sustained episode leading to shock on November 5 and had been drinking 6 -7 beers.  All of these have a pattern of torsades de pointes. He was set up for coronary CT but deferred to due to rapid atrial fibrillation. The torsades appeared pause-mediated, addition of atrial lead was recommended to keep HR from dropping low.   He was persistently in atrial fibrillation and had DCCV to NSR in 1/24.   Today he returns for HF follow up.  Weight up 3 lbs.  He just got married.  He has cut back on ETOH, drinks about 4 beers every other  day.  Main complaint is low back pain.  This limits his exercise.  He has not been regular with CPAP.  He is short of breath walking up stairs, does ok on flat ground.  No chest pain. No orthopnea/PND.    ECG (personally reviewed): sinus brady at 56, nonspecific T wave flattening  St Jude device interrogation: Stable thoracic impedance, 1 additional episode of NSVT.   Labs (7/19): K 4.9, creatinine 0.91 Labs (8/19): Na 128, K 4.9, creatinine 0.72 Labs (9/19): LDL 45 Labs (12/19): K 3.9, creatinine 0.94  Labs (1/20): K 4.5, creatinine 0.94, hgb 13.2 Labs (10/20): K 4.4, creatinine 0.99 Labs (12/20): K 4.6, creatinine 1.0 Labs (2/21): K 3.9, creatinine 0.86 Labs (4/21): K 4.9, creatinine 1.05 Labs (6/21): K 4, creatinine 0.94 Labs (12/21): K 4.6, creatinine 1.0, LFTs normal, hgb 14.1, TGs 163, LDL 71 Labs (2/22): K 3.33m, creatinine 0.91, BNP 1130 Labs (8/22): K 4.8, creatinine 1.36 Labs (4/23): BNP 254, K 4.5, creatinine 1.13 Labs (5/23): Creatinine 1.38  Labs (12/23): Creatinine 1.3, K 4.4, LDL 48 Labs (1/24): K 4.6, creatinine 1.02  PMH: 1. OSA: uses CPAP.  2. Depression 3. Chronic systolic CHF: Nonischemic cardiomyopathy.  Diagnosed around 2/18.  - LHC (2/18): Normal coronaries.  - Echo (2/18): EF 20-25% - Echo (6/18): EF 40% - Echo (12/18): EF 35-40% -  Echo (6/19): LV mildly dilated, EF 15-20%, RV severely dilated with severely decreased systolic function.  - CPX (7/19): peak VO2 16, VE/VCO2 slope 37, RER 1.03 => submaximal but probably moderate functional limitation.  - Cardiac MRI (7/19): EF 29% with mild LV dilation, mildly decreased RV systolic function EF 86%, mid-wall LGE throughtout the septal wall and the inferor wall.  - St Jude ICD placed in 8/19.  - Echo (12/19): EF 25-30%, diffuse hypokinesis, normal RV size and systolic function.  - Echo (12/20): EF 25-30%, moderate LVH, normal RV size and systolic function.  - TEE (12/20): EF 25-30%, mildly decreased RV systolic  function.  - Echo (4/22): EF 25-30%, mild LV dilation, normal RV. - Echo (5/23): EF 30-35%, global HK, mild LVH, normal RV. 4. ETOH abuse.  5. Atrial fibrillation: First noted in 12/20.  - DCCV to NSR in 12/20.  - DCCV to NSR in 1/24 6. Left shoulder OA 7. THR 8/22  SH: Originally from [place], divorced with 3 kids and now remarried, works in [employer], never smoked, h/o ETOH abuse. No drugs. Lives in [place].   FH: Sister with congenital heart abnormality.  No other cardiac disease.   ROS: All systems reviewed and negative except as per HPI.  Current Outpatient Medications  Medication Sig Dispense Refill   carvedilol (COREG) 25 MG tablet TAKE 1 TABLET (25 MG TOTAL) BY MOUTH 2 (TWO) TIMES DAILY WITH A MEAL. 180 tablet 1   diphenhydramine-acetaminophen (TYLENOL PM) 25-500 MG TABS tablet Take 2 tablets by mouth at bedtime.     ELIQUIS 5 MG TABS tablet TAKE 1 TABLET BY MOUTH TWICE A DAY 180 tablet 3   ENTRESTO 97-103 MG TAKE 1 TABLET BY MOUTH TWICE A DAY 60 tablet 5   FARXIGA 10 MG TABS tablet TAKE 1 TABLET BY MOUTH EVERY DAY BEFORE BREAKFAST 90 tablet 3   Multiple Vitamin (MULTIVITAMIN WITH MINERALS) TABS tablet Take 1 tablet by mouth in the morning.     rosuvastatin (CRESTOR) 20 MG tablet TAKE 1 TABLET BY MOUTH EVERY DAY 90 tablet 3   sertraline (ZOLOFT) 100 MG tablet Take 100 mg by mouth daily.     sildenafil (VIAGRA) 100 MG tablet TAKE 0.5 TABLETS (50 MG TOTAL) BY MOUTH AT BEDTIME AS NEEDED FOR ERECTILE DYSFUNCTION. 10 tablet 3   torsemide (DEMADEX) 20 MG tablet Take 2 tablets (40 mg total) by mouth daily. 180 tablet 2   traZODone (DESYREL) 50 MG tablet TAKE 1 TABLET BY MOUTH EVERY DAY AT BEDTIME AS NEEDED 90 tablet 3   spironolactone (ALDACTONE) 50 MG tablet Take 0.5 tablets (25 mg total) by mouth at bedtime. 45 tablet 2   No current facility-administered medications for this encounter.   Wt Readings from Last 3 Encounters:  10/23/22 124.5 kg (274 lb 6.4 oz)  10/17/22 121.1  kg (267 lb)  10/07/22 120.2 kg (265 lb)    BP 100/60   Pulse 88   Wt 124.5 kg (274 lb 6.4 oz)   SpO2 97%   BMI 39.37 kg/m  General: NAD, obese Neck: No JVD, no thyromegaly or thyroid nodule.  Lungs: Clear to auscultation bilaterally with normal respiratory effort. CV: Nondisplaced PMI.  Heart regular S1/S2, no S3/S4, no murmur.  No peripheral edema.  No carotid bruit.  Normal pedal pulses.  Abdomen: Soft, nontender, no hepatosplenomegaly, no distention.  Skin: Intact without lesions or rashes.  Neurologic: Alert and oriented x 3.  Psych: Normal affect. Extremities: No clubbing or cyanosis.  HEENT: Normal.  Assessment/Plan: 1. Chronic systolic CHF: Nonischemic cardiomyopathy with biventricular failure.  Cath in 2/18 with no coronary disease.  Echo (6/19) with EF 15-20%, severe RV dilation/dysfunction.  CPX 7/19 submaximal but probably moderate functional limitation.  Cardiac MRI in 7/19 showed EF 29%.  LGE pattern was concerning for prior viral myocarditis. Echo 01/2022 showed EF 30-35%, global HK, mild LVH, normal RV.  Has St Jude ICD.  Cause of CMP suspected to be viral myocarditis versus ETOH.  No family history of cardiomyopathy.  NYHA class II, more limited by back pain.  Not volume overloaded on exam or by [place].  - Continue torsemide 40 mg daily. Check BMET/BNP.  - Continue dapagliflozin 10 mg daily.  - Continue spironolactone to 25 mg daily  - Continue Coreg 25 mg bid. - Continue Entresto 97/103 bid.  - He needs to stop ETOH => suspect this is either the cause or a contributor to his cardiomyopathy.  He has cut back. - CPX in future, cannot do yet given significant low back pain.  - Cardiac contractility modulation would be a good option for him, would wait until after atrial lead has been placed.  - Not CRT candidate with narrow QRS.  - Repeat echo at followup visit in 3 months.  2. OSA: Needs to use CPAP nightly.   3. Anxiety/depression: Seeing a psychiatrist.  4. ETOH  Abuse: This is likely causing/contributing to his cardiomyopathy and likely played a role in triggering atrial fibrillation.   - He has cut back.  5. Atrial fibrillation: Paroxysmal.  NSR today after DCCV in 1/24. Would like to see him get an atrial fibrillation ablation, but first should use CPAP, work on weight loss, and cut back ETOH.  - Continue Eliquis 5 mg bid.   6. VT: Episode of VT in 4/23 terminated by ATP.  VT 11/23 with shock, appeared to be pause-mediated torsades.  - To have atrial lead placed by EP to prevent bradycardia.  - Will get coronary CTA to make sure he has not developed coronary disease with ischemia to explain VT/torsades.  - Continue Coreg.   7. Obesity: I will refer to pharmacy clinic to see if he can get GLP-1 agonist.   Followup 3 months with echo.   Loralie Champagne  10/24/2022

## 2022-10-25 ENCOUNTER — Ambulatory Visit: Payer: Managed Care, Other (non HMO) | Attending: Orthopedic Surgery | Admitting: Cardiology

## 2022-10-25 ENCOUNTER — Encounter: Payer: Self-pay | Admitting: Cardiology

## 2022-10-25 VITALS — BP 110/68 | HR 74 | Ht 71.0 in | Wt 276.6 lb

## 2022-10-25 DIAGNOSIS — I480 Paroxysmal atrial fibrillation: Secondary | ICD-10-CM | POA: Diagnosis not present

## 2022-10-25 DIAGNOSIS — I5042 Chronic combined systolic (congestive) and diastolic (congestive) heart failure: Secondary | ICD-10-CM | POA: Diagnosis not present

## 2022-10-25 DIAGNOSIS — I472 Ventricular tachycardia, unspecified: Secondary | ICD-10-CM

## 2022-10-25 NOTE — Patient Instructions (Signed)
Medication Instructions:  Your physician recommends that you continue on your current medications as directed. Please refer to the Current Medication list given to you today.  *If you need a refill on your cardiac medications before your next appointment, please call your pharmacy*   Lab Work: BMET and CBC prior to your procedure If you have labs (blood work) drawn today and your tests are completely normal, you will receive your results only by: [place] (if you have MyChart) OR A paper copy in the mail If you have any lab test that is abnormal or we need to change your treatment, we will call you to review the results.  Testing/Procedures: We will call you to arrange device upgrade procedure.   Follow-Up: At [facility], you and your health needs are our priority.  As part of our continuing mission to provide you with exceptional heart care, we have created designated Provider Care Teams.  These Care Teams include your primary Cardiologist (physician) and Advanced Practice Providers (APPs -  Physician Assistants and Nurse Practitioners) who all work together to provide you with the care you need, when you need it.  Your next appointment:   We will call you to arrange follow up appointments.

## 2022-10-25 NOTE — Progress Notes (Signed)
Electrophysiology Office Note:    Date:  10/25/2022   ID:  Dola Factor, DOB Nov 08, 1960, MRN [id number]  PCP:  Marin Olp, MD  [facility] Cardiologist:  Loralie Champagne, MD  [facility] Electrophysiologist:  None   Referring MD: Marin Olp, MD   Chief Complaint: Systolic heart failure and VT  History of Present Illness:    John [name] is a 62 y.o. male who presents for an evaluation of systolic heart failure and VT at the request of Dr. Aundra Dubin. Their medical history includes chronic systolic heart failure, obesity, sleep apnea and atrial fibrillation.  The patient was seen by Kirk Ruths October 23, 2022.  His ICD was implanted in 2020.  He had a cardioversion for his A-fib in 2020.  He does have a history of alcohol abuse.  History of appropriate therapies for ventricular tachycardia in April 2023.  His device is followed by Dr. Sallyanne Kuster.  He has a history of shocks and the second half of 2023 in the setting of alcohol use.  His torsades appeared to be pause mediated and an atrial lead was recommended.  Today, he presents feeling overall well. He reports that he has had heart failure since having a shoulder operation. He is complaint with eliquis.   He  denies any palpitations, chest pain, shortness of breath, or peripheral edema. No lightheadedness, headaches, syncope, orthopnea, or PND.  Past Medical History:  Diagnosis Date   AICD (automatic cardioverter/defibrillator) present    St. Jude   Anxiety    Ativan   Chronic combined systolic and diastolic CHF, NYHA class 3 ([place]) 10/2016   Nonischemic cardiomyopathy. EF 20-25%.   Chronic left hip pain    Depression    history of   Dysrhythmia    A-FIB   GI bleed 03/2016   Headache    Hypertensive heart disease with combined systolic and diastolic congestive heart failure ([place]) 10/2016   Nonischemic cardiomyopathy ([place]) 10/2016   Echo with EF 20-25%. Cardiac catheterization with no CAD. LVEDP was 41 mmHg, PCWP  36 mmHg   Osteoarthritis    right shoulder   Right hand fracture    Seasonal allergies    Sleep apnea    wears a CPAP   Wears glasses     Past Surgical History:  Procedure Laterality Date   CARDIAC CATHETERIZATION     CARDIOVERSION N/A 09/01/2019   Procedure: CARDIOVERSION;  Surgeon: Larey Dresser, MD;  Location: [facility] ENDOSCOPY;  Service: Cardiovascular;  Laterality: N/A;   CARDIOVERSION N/A 10/07/2022   Procedure: CARDIOVERSION;  Surgeon: Berniece Salines, DO;  Location: [facility] ENDOSCOPY;  Service: Cardiovascular;  Laterality: N/A;   ESOPHAGOGASTRODUODENOSCOPY (EGD) WITH PROPOFOL N/A 03/20/2016   Procedure: ESOPHAGOGASTRODUODENOSCOPY (EGD) WITH PROPOFOL;  Surgeon: Wilford Corner, MD;  Location: [facility] ENDOSCOPY;  Service: Endoscopy;  Laterality: N/A;   ICD IMPLANT N/A 05/06/2018   Procedure: ICD IMPLANT;  Surgeon: Sanda Klein, MD;  Location: [place] CV LAB;  Service: Cardiovascular;  Laterality: N/A;   IRRIGATION AND DEBRIDEMENT SHOULDER Left 11/11/2019   Procedure: LEFT SHOULDER IRRIGATION AND DEBRIDEMENT WITH POLY EXCHANGE;  Surgeon: Tania Ade, MD;  Location: [facility] ORS;  Service: Orthopedics;  Laterality: Left;   KYPHOPLASTY N/A 02/22/2021   Procedure: KYPHOPLASTY T12;  Surgeon: Melina Schools, MD;  Location: [place];  Service: Orthopedics;  Laterality: N/A;  90 mins   ORIF WRIST FRACTURE Left 02/04/2022   Procedure: OPEN REDUCTION INTERNAL FIXATION (ORIF) WRIST FRACTURE;  Surgeon: Iran Planas, MD;  Location: [place];  Service:  Orthopedics;  Laterality: Left;  with IV sedation   RIGHT/LEFT HEART CATH AND CORONARY ANGIOGRAPHY N/A 10/21/2016   Procedure: Right/Left Heart Cath and Coronary Angiography;  Surgeon: Lorretta Harp, MD;  Location: [place] CV LAB;  Service: Cardiovascular: Angiographically normal coronary arteries. PCWP 33-36 mmHg, LVEDP 41 mmHg. PA pressure 60/35, mean 46 mmHg.  Cardiac output/cardiac index-3.93 /1.76 Kathlen Brunswick), 3.49/1.57 (thermodilution)   TEE WITHOUT  CARDIOVERSION N/A 09/01/2019   Procedure: TRANSESOPHAGEAL ECHOCARDIOGRAM (TEE);  Surgeon: Larey Dresser, MD;  Location: [facility] ENDOSCOPY;  Service: Cardiovascular;  Laterality: N/A;   TOTAL HIP ARTHROPLASTY Left 03/27/2015   Procedure: LEFT TOTAL HIP ARTHROPLASTY ANTERIOR APPROACH;  Surgeon: Rod Can, MD;  Location: [place];  Service: Orthopedics;  Laterality: Left;   TOTAL HIP ARTHROPLASTY Right 05/03/2021   Procedure: TOTAL HIP ARTHROPLASTY ANTERIOR APPROACH;  Surgeon: Rod Can, MD;  [facility];  Service: Orthopedics;  Laterality: Right;   TOTAL SHOULDER ARTHROPLASTY Right 11/24/2015   Procedure: RIGHT TOTAL SHOULDER ARTHROPLASTY;  Surgeon: Netta Cedars, MD;  Location: [place];  Service: Orthopedics;  Laterality: Right;   TOTAL SHOULDER ARTHROPLASTY Left 10/19/2019   Procedure: REVERSE TOTAL SHOULDER ARTHROPLASTY;  Surgeon: Tania Ade, MD;  Location: [facility] ORS;  Service: Orthopedics;  Laterality: Left;   TRANSTHORACIC ECHOCARDIOGRAM  10/2016   EF 20-25%. Diffuse hypokinesis but akinesis of the entire inferoseptal wall and apical wall. Moderate biatrial enlargement. PA pressure estimated 64 mmHg.   WISDOM TOOTH EXTRACTION      Current Medications: Current Meds  Medication Sig   carvedilol (COREG) 25 MG tablet TAKE 1 TABLET (25 MG TOTAL) BY MOUTH 2 (TWO) TIMES DAILY WITH A MEAL.   diphenhydramine-acetaminophen (TYLENOL PM) 25-500 MG TABS tablet Take 2 tablets by mouth at bedtime.   ELIQUIS 5 MG TABS tablet TAKE 1 TABLET BY MOUTH TWICE A DAY   ENTRESTO 97-103 MG TAKE 1 TABLET BY MOUTH TWICE A DAY   FARXIGA 10 MG TABS tablet TAKE 1 TABLET BY MOUTH EVERY DAY BEFORE BREAKFAST   Multiple Vitamin (MULTIVITAMIN WITH MINERALS) TABS tablet Take 1 tablet by mouth in the morning.   rosuvastatin (CRESTOR) 20 MG tablet TAKE 1 TABLET BY MOUTH EVERY DAY   sertraline (ZOLOFT) 100 MG tablet Take 100 mg by mouth daily.   sildenafil (VIAGRA) 100 MG tablet TAKE 0.5 TABLETS (50 MG TOTAL) BY MOUTH AT  BEDTIME AS NEEDED FOR ERECTILE DYSFUNCTION.   spironolactone (ALDACTONE) 50 MG tablet Take 0.5 tablets (25 mg total) by mouth at bedtime.   torsemide (DEMADEX) 20 MG tablet Take 2 tablets (40 mg total) by mouth daily.   traZODone (DESYREL) 50 MG tablet TAKE 1 TABLET BY MOUTH EVERY DAY AT BEDTIME AS NEEDED     Allergies:   Cymbalta [duloxetine hcl] and Hydrocodone   Social History   Socioeconomic History   Marital status: Married    Spouse name: Not on file   Number of children: 3   Years of education: Not on file   Highest education level: Not on file  Occupational History   Not on file  Tobacco Use   Smoking status: Never   Smokeless tobacco: Never  Vaping Use   Vaping Use: Never used  Substance and Sexual Activity   Alcohol use: Yes    Alcohol/week: 12.0 standard drinks of alcohol    Types: 12 Cans of beer per week    Comment: per week   Drug use: No   Sexual activity: Yes    Partners: Female  Comment: gf only  Other Topics Concern   Not on file  Social History Narrative   Engaged 2022- long term GF/lives with him. 2 dogs. Close with 3 sons that work for wife- christopher 15 at [employer], [place] 21, [place] 25 in 2021.       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    Grew up in [place].    Social Determinants of Health   Financial Resource Strain: Not on file  Food Insecurity: Not on file  Transportation Needs: Not on file  Physical Activity: Not on file  Stress: Not on file  Social Connections: Not on file     Family History: The patient's family history includes Congenital heart disease in his sister; Dementia in his mother; Healthy in his brother, brother, brother, and sister; Lung cancer in his father; Prostate cancer in his brother.  ROS:   Please see the history of present illness.    All other systems reviewed and are negative.  EKGs/Labs/Other Studies Reviewed:     The following studies were reviewed today:  June 26, 2022 chest x-ray reviewed and shows a VVI ICD with the ventricular lead on the mid/high ventricular septum.  There is in October 2023 CT chest abdomen pelvis that suggests that the left subclavian venous system is open.  November 2023 VT device EGM   Recent Labs: 07/24/2022: Magnesium 2.2; NT-Pro BNP 516 08/22/2022: ALT 22; TSH 1.63 09/27/2022: Hemoglobin 12.9; Platelets 319 10/23/2022: B Natriuretic Peptide 625.0; BUN 13; Creatinine, Ser 0.99; Potassium 4.0; Sodium 134  Recent Lipid Panel    Component Value Date/Time   CHOL 144 08/22/2022 1225   CHOL 111 06/09/2018 1137   TRIG 199.0 (H) 08/22/2022 1225   HDL 55.40 08/22/2022 1225   HDL 53 06/09/2018 1137   CHOLHDL 3 08/22/2022 1225   VLDL 39.8 08/22/2022 1225   LDLCALC 48 08/22/2022 1225   LDLCALC 71 08/24/2020 1004    Physical Exam:    VS:  BP 110/68   Pulse 74   Ht 5' 11"$  (1.803 m)   Wt 276 lb 9.6 oz (125.5 kg)   SpO2 94%   BMI 38.58 kg/m     Wt Readings from Last 3 Encounters:  10/25/22 276 lb 9.6 oz (125.5 kg)  10/23/22 274 lb 6.4 oz (124.5 kg)  10/17/22 267 lb (121.1 kg)     GEN:  Well nourished, well developed in no acute distress CARDIAC: RRR, no murmurs, rubs, gallops.  ICD pocket well-healed PSYCHIATRIC:  Normal affect       ASSESSMENT:    1. Chronic combined systolic (congestive) and diastolic (congestive) heart failure (HCC)   2. Paroxysmal atrial fibrillation ([place])   3. VT (ventricular tachycardia) ([place])   4. Severe obesity (BMI 35.0-39.9) with comorbidity ([place])    PLAN:    In order of problems listed above:  #Chronic systolic heart failure NYHA class II-III.  Warm and dry on exam.  Follows with Dr. Aundra Dubin in the heart failure clinic. Last EF 30 to 35% in May 2023. QRS narrow on February 2024 EKG  Could potentially be a candidate for CCM although I would like to get his atrial lead placed first and allow him to heal.  With end-stage  CCM implant.  We did discuss CCM therapy during today's visit.  I will plan to get this type of procedure scheduled after our post atrial lead implant office visit.  #Ventricular tachycardia Long short secondary to  bradycardia.  Need to atrial lead to be able to set a lower rate of 60 bpm.  I discussed the atrial lead addition procedure in detail during today's visit including the risks.  We discussed the possibility that the left subclavian system would be occluded although I do not think this is likely given CT scan results as mentioned above.  He will need to hold his Eliquis for 3 days prior to atrial lead addition.  Risks, benefits, alternatives to atrial lead addition were discussed in detail with the patient today. The patient understands that the risks include but are not limited to bleeding, infection, pneumothorax, perforation, tamponade, vascular damage, renal failure, MI, stroke, death, and lead dislodgement and wishes to proceed.  We will therefore schedule device implantation at the next available time.    Medication Adjustments/Labs and Tests Ordered: Current medicines are reviewed at length with the patient today.  Concerns regarding medicines are outlined above.  Orders Placed This Encounter  Procedures   Basic metabolic panel   CBC with Differential/Platelet   No orders of the defined types were placed in this encounter.  I,Rachel Rivera,acting as a scribe for Vickie Epley, MD.,have documented all relevant documentation on the behalf of Vickie Epley, MD,as directed by  Vickie Epley, MD while in the presence of Vickie Epley, MD.  I, Vickie Epley, MD, have reviewed all documentation for this visit. The documentation on 10/25/22 for the exam, diagnosis, procedures, and orders are all accurate and complete.  Signed, Hilton Cork. Quentin Ore, MD, [facility], [facility] 10/25/2022 3:31 PM    Electrophysiology [facility]

## 2022-10-25 NOTE — Progress Notes (Deleted)
Electrophysiology Office Note:    Date:  10/25/2022   ID:  Dola Factor, DOB 1961-04-29, MRN [id number]  PCP:  Marin Olp, MD  [facility] Cardiologist:  Loralie Champagne, MD  [facility] Electrophysiologist:  None   Referring MD: Marin Olp, MD   Chief Complaint: Systolic heart failure and VT  History of Present Illness:    John [name] is a 62 y.o. male who presents for an evaluation of systolic heart failure and VT at the request of Dr. Aundra Dubin. Their medical history includes chronic systolic heart failure, obesity, sleep apnea and atrial fibrillation.  The patient was seen by Kirk Ruths October 23, 2022.  His ICD was implanted in 2020.  He had a cardioversion for his A-fib in 2020.  He does have a history of alcohol abuse.  History of appropriate therapies for ventricular tachycardia in April 2023.  His device is followed by Dr. Sallyanne Kuster.  He has a history of shocks and the second half of 2023 in the setting of alcohol use.  His torsades appeared to be pause mediated and an atrial lead was recommended.     Past Medical History:  Diagnosis Date   AICD (automatic cardioverter/defibrillator) present    St. Jude   Anxiety    Ativan   Chronic combined systolic and diastolic CHF, NYHA class 3 ([place]) 10/2016   Nonischemic cardiomyopathy. EF 20-25%.   Chronic left hip pain    Depression    history of   Dysrhythmia    A-FIB   GI bleed 03/2016   Headache    Hypertensive heart disease with combined systolic and diastolic congestive heart failure ([place]) 10/2016   Nonischemic cardiomyopathy ([place]) 10/2016   Echo with EF 20-25%. Cardiac catheterization with no CAD. LVEDP was 41 mmHg, PCWP 36 mmHg   Osteoarthritis    right shoulder   Right hand fracture    Seasonal allergies    Sleep apnea    wears a CPAP   Wears glasses     Past Surgical History:  Procedure Laterality Date   CARDIAC CATHETERIZATION     CARDIOVERSION N/A 09/01/2019   Procedure: CARDIOVERSION;   Surgeon: Larey Dresser, MD;  Location: [facility] ENDOSCOPY;  Service: Cardiovascular;  Laterality: N/A;   CARDIOVERSION N/A 10/07/2022   Procedure: CARDIOVERSION;  Surgeon: Berniece Salines, DO;  Location: [facility] ENDOSCOPY;  Service: Cardiovascular;  Laterality: N/A;   ESOPHAGOGASTRODUODENOSCOPY (EGD) WITH PROPOFOL N/A 03/20/2016   Procedure: ESOPHAGOGASTRODUODENOSCOPY (EGD) WITH PROPOFOL;  Surgeon: Wilford Corner, MD;  Location: [facility] ENDOSCOPY;  Service: Endoscopy;  Laterality: N/A;   ICD IMPLANT N/A 05/06/2018   Procedure: ICD IMPLANT;  Surgeon: Sanda Klein, MD;  Location: [place] CV LAB;  Service: Cardiovascular;  Laterality: N/A;   IRRIGATION AND DEBRIDEMENT SHOULDER Left 11/11/2019   Procedure: LEFT SHOULDER IRRIGATION AND DEBRIDEMENT WITH POLY EXCHANGE;  Surgeon: Tania Ade, MD;  [facility] ORS;  Service: Orthopedics;  Laterality: Left;   KYPHOPLASTY N/A 02/22/2021   Procedure: KYPHOPLASTY T12;  Surgeon: Melina Schools, MD;  Location: [place];  Service: Orthopedics;  Laterality: N/A;  90 mins   ORIF WRIST FRACTURE Left 02/04/2022   Procedure: OPEN REDUCTION INTERNAL FIXATION (ORIF) WRIST FRACTURE;  Surgeon: Iran Planas, MD;  Location: [place];  Service: Orthopedics;  Laterality: Left;  with IV sedation   RIGHT/LEFT HEART CATH AND CORONARY ANGIOGRAPHY N/A 10/21/2016   Procedure: Right/Left Heart Cath and Coronary Angiography;  Surgeon: Lorretta Harp, MD;  Location: [place] CV LAB;  Service: Cardiovascular: Angiographically normal  coronary arteries. PCWP 33-36 mmHg, LVEDP 41 mmHg. PA pressure 60/35, mean 46 mmHg.  Cardiac output/cardiac index-3.93 /1.76 Kathlen Brunswick), 3.49/1.57 (thermodilution)   TEE WITHOUT CARDIOVERSION N/A 09/01/2019   Procedure: TRANSESOPHAGEAL ECHOCARDIOGRAM (TEE);  Surgeon: Larey Dresser, MD;  Location: [facility] ENDOSCOPY;  Service: Cardiovascular;  Laterality: N/A;   TOTAL HIP ARTHROPLASTY Left 03/27/2015   Procedure: LEFT TOTAL HIP ARTHROPLASTY ANTERIOR APPROACH;  Surgeon: Rod Can, MD;  Location: [place];  Service: Orthopedics;  Laterality: Left;   TOTAL HIP ARTHROPLASTY Right 05/03/2021   Procedure: TOTAL HIP ARTHROPLASTY ANTERIOR APPROACH;  Surgeon: Rod Can, MD;  [facility];  Service: Orthopedics;  Laterality: Right;   TOTAL SHOULDER ARTHROPLASTY Right 11/24/2015   Procedure: RIGHT TOTAL SHOULDER ARTHROPLASTY;  Surgeon: Netta Cedars, MD;  Location: [place];  Service: Orthopedics;  Laterality: Right;   TOTAL SHOULDER ARTHROPLASTY Left 10/19/2019   Procedure: REVERSE TOTAL SHOULDER ARTHROPLASTY;  Surgeon: Tania Ade, MD;  [facility];  Service: Orthopedics;  Laterality: Left;   TRANSTHORACIC ECHOCARDIOGRAM  10/2016   EF 20-25%. Diffuse hypokinesis but akinesis of the entire inferoseptal wall and apical wall. Moderate biatrial enlargement. PA pressure estimated 64 mmHg.   WISDOM TOOTH EXTRACTION      Current Medications: No outpatient medications have been marked as taking for the 10/25/22 encounter (Appointment) with Vickie Epley, MD.     Allergies:   Cymbalta [duloxetine hcl] and Hydrocodone   Social History   Socioeconomic History   Marital status: Married    Spouse name: Not on file   Number of children: 3   Years of education: Not on file   Highest education level: Not on file  Occupational History   Not on file  Tobacco Use   Smoking status: Never   Smokeless tobacco: Never  Vaping Use   Vaping Use: Never used  Substance and Sexual Activity   Alcohol use: Yes    Alcohol/week: 12.0 standard drinks of alcohol    Types: 12 Cans of beer per week    Comment: per week   Drug use: No   Sexual activity: Yes    Partners: Female    Comment: gf only  Other Topics Concern   Not on file  Social History Narrative   Engaged 2022- long term GF/lives with him. 2 dogs. Close with 3 sons that work for wife- christopher 15 at [employer], [place] 21, [place] 25 in 2021.       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    Grew up in [place].    Social Determinants of Health   Financial Resource Strain: Not on file  Food Insecurity: Not on file  Transportation Needs: Not on file  Physical Activity: Not on file  Stress: Not on file  Social Connections: Not on file     Family History: The patient's family history includes Congenital heart disease in his sister; Dementia in his mother; Healthy in his brother, brother, brother, and sister; Lung cancer in his father; Prostate cancer in his brother.  ROS:   Please see the history of present illness.    All other systems reviewed and are negative.  EKGs/Labs/Other Studies Reviewed:    The following studies were reviewed today:  June 26, 2022 chest x-ray reviewed and shows a VVI ICD with the ventricular lead on the mid/high ventricular septum.  There is in October 2023 CT chest abdomen pelvis that suggests that the left subclavian venous system  is open.  November 2023 VT device EGM   Recent Labs: 07/24/2022: Magnesium 2.2; NT-Pro BNP 516 08/22/2022: ALT 22; TSH 1.63 09/27/2022: Hemoglobin 12.9; Platelets 319 10/23/2022: B Natriuretic Peptide 625.0; BUN 13; Creatinine, Ser 0.99; Potassium 4.0; Sodium 134  Recent Lipid Panel    Component Value Date/Time   CHOL 144 08/22/2022 1225   CHOL 111 06/09/2018 1137   TRIG 199.0 (H) 08/22/2022 1225   HDL 55.40 08/22/2022 1225   HDL 53 06/09/2018 1137   CHOLHDL 3 08/22/2022 1225   VLDL 39.8 08/22/2022 1225   LDLCALC 48 08/22/2022 1225   LDLCALC 71 08/24/2020 1004    Physical Exam:    VS:  There were no vitals taken for this visit.    Wt Readings from Last 3 Encounters:  10/23/22 274 lb 6.4 oz (124.5 kg)  10/17/22 267 lb (121.1 kg)  10/07/22 265 lb (120.2 kg)     GEN: *** Well nourished, well developed in no acute distress CARDIAC: ***RRR, no murmurs, rubs, gallops.  ICD pocket well-healed PSYCHIATRIC:  Normal affect        ASSESSMENT:    1. Chronic combined systolic (congestive) and diastolic (congestive) heart failure (HCC)   2. Paroxysmal atrial fibrillation ([place])   3. VT (ventricular tachycardia) ([place])   4. Severe obesity (BMI 35.0-39.9) with comorbidity ([place])    PLAN:    In order of problems listed above:  #Chronic systolic heart failure NYHA class II-III.  Warm and dry on exam.  Follows with Dr. Aundra Dubin in the heart failure clinic. Last EF 30 to 35% in May 2023. QRS narrow on February 2024 EKG  Could potentially be a candidate for CCM although I would like to get his atrial lead placed first and allow him to heal.  With end-stage CCM implant.  We did discuss CCM therapy during today's visit.  I will plan to get this type of procedure scheduled after our post atrial lead implant office visit.  #Ventricular tachycardia Long short secondary to bradycardia.  Need to atrial lead to be able to set a lower rate of 60 bpm.  I discussed the atrial lead addition procedure in detail during today's visit including the risks.  We discussed the possibility that the left subclavian system would be occluded although I do not think this is likely given CT scan results as mentioned above.  He will need to hold his Eliquis for 3 days prior to atrial lead addition.  Risks, benefits, alternatives to atrial lead addition were discussed in detail with the patient today. The patient understands that the risks include but are not limited to bleeding, infection, pneumothorax, perforation, tamponade, vascular damage, renal failure, MI, stroke, death, and lead dislodgement and wishes to proceed.  We will therefore schedule device implantation at the next available time.     Medication Adjustments/Labs and Tests Ordered: Current medicines are reviewed at length with the patient today.  Concerns regarding medicines are outlined above.  No orders of the defined types were placed in this encounter.  No orders of the defined  types were placed in this encounter.    Signed, Hilton Cork. Quentin Ore, MD, [facility], [facility] 10/25/2022 5:49 AM    Electrophysiology [facility]

## 2022-10-25 NOTE — H&P (View-Only) (Signed)
Electrophysiology Office Note:    Date:  10/25/2022   ID:  John [name], DOB 1961-02-18, MRN [id number]  PCP:  Marin Olp, MD  [facility] Cardiologist:  Loralie Champagne, MD  [facility] Electrophysiologist:  None   Referring MD: Marin Olp, MD   Chief Complaint: Systolic heart failure and VT  History of Present Illness:    John [name] is a 62 y.o. male who presents for an evaluation of systolic heart failure and VT at the request of Dr. Aundra Dubin. Their medical history includes chronic systolic heart failure, obesity, sleep apnea and atrial fibrillation.  The patient was seen by Kirk Ruths October 23, 2022.  His ICD was implanted in 2020.  He had a cardioversion for his A-fib in 2020.  He does have a history of alcohol abuse.  History of appropriate therapies for ventricular tachycardia in April 2023.  His device is followed by Dr. Sallyanne Kuster.  He has a history of shocks and the second half of 2023 in the setting of alcohol use.  His torsades appeared to be pause mediated and an atrial lead was recommended.  Today, he presents feeling overall well. He reports that he has had heart failure since having a shoulder operation. He is complaint with eliquis.   He  denies any palpitations, chest pain, shortness of breath, or peripheral edema. No lightheadedness, headaches, syncope, orthopnea, or PND.  Past Medical History:  Diagnosis Date   AICD (automatic cardioverter/defibrillator) present    St. Jude   Anxiety    Ativan   Chronic combined systolic and diastolic CHF, NYHA class 3 ([place]) 10/2016   Nonischemic cardiomyopathy. EF 20-25%.   Chronic left hip pain    Depression    history of   Dysrhythmia    A-FIB   GI bleed 03/2016   Headache    Hypertensive heart disease with combined systolic and diastolic congestive heart failure ([place]) 10/2016   Nonischemic cardiomyopathy ([place]) 10/2016   Echo with EF 20-25%. Cardiac catheterization with no CAD. LVEDP was 41 mmHg, PCWP  36 mmHg   Osteoarthritis    right shoulder   Right hand fracture    Seasonal allergies    Sleep apnea    wears a CPAP   Wears glasses     Past Surgical History:  Procedure Laterality Date   CARDIAC CATHETERIZATION     CARDIOVERSION N/A 09/01/2019   Procedure: CARDIOVERSION;  Surgeon: Larey Dresser, MD;  Location: [facility] ENDOSCOPY;  Service: Cardiovascular;  Laterality: N/A;   CARDIOVERSION N/A 10/07/2022   Procedure: CARDIOVERSION;  Surgeon: Berniece Salines, DO;  Location: [facility] ENDOSCOPY;  Service: Cardiovascular;  Laterality: N/A;   ESOPHAGOGASTRODUODENOSCOPY (EGD) WITH PROPOFOL N/A 03/20/2016   Procedure: ESOPHAGOGASTRODUODENOSCOPY (EGD) WITH PROPOFOL;  Surgeon: Wilford Corner, MD;  Location[facility] ENDOSCOPY;  Service: Endoscopy;  Laterality: N/A;   ICD IMPLANT N/A 05/06/2018   Procedure: ICD IMPLANT;  Surgeon: Sanda Klein, MD;  Location: [place] CV LAB;  Service: Cardiovascular;  Laterality: N/A;   IRRIGATION AND DEBRIDEMENT SHOULDER Left 11/11/2019   Procedure: LEFT SHOULDER IRRIGATION AND DEBRIDEMENT WITH POLY EXCHANGE;  Surgeon: Tania Ade, MD;  Location: [facility] ORS;  Service: Orthopedics;  Laterality: Left;   KYPHOPLASTY N/A 02/22/2021   Procedure: KYPHOPLASTY T12;  Surgeon: Melina Schools, MD;  Location: [place];  Service: Orthopedics;  Laterality: N/A;  90 mins   ORIF WRIST FRACTURE Left 02/04/2022   Procedure: OPEN REDUCTION INTERNAL FIXATION (ORIF) WRIST FRACTURE;  Surgeon: Iran Planas, MD;  Location: [place];  Service:  Orthopedics;  Laterality: Left;  with IV sedation   RIGHT/LEFT HEART CATH AND CORONARY ANGIOGRAPHY N/A 10/21/2016   Procedure: Right/Left Heart Cath and Coronary Angiography;  Surgeon: Lorretta Harp, MD;  Location: [place] CV LAB;  Service: Cardiovascular: Angiographically normal coronary arteries. PCWP 33-36 mmHg, LVEDP 41 mmHg. PA pressure 60/35, mean 46 mmHg.  Cardiac output/cardiac index-3.93 /1.76 Kathlen [place]), 3.49/1.57 (thermodilution)   TEE WITHOUT  CARDIOVERSION N/A 09/01/2019   Procedure: TRANSESOPHAGEAL ECHOCARDIOGRAM (TEE);  Surgeon: Larey Dresser, MD;  Location: [facility] ENDOSCOPY;  Service: Cardiovascular;  Laterality: N/A;   TOTAL HIP ARTHROPLASTY Left 03/27/2015   Procedure: LEFT TOTAL HIP ARTHROPLASTY ANTERIOR APPROACH;  Surgeon: Rod Can, MD;  Location: [place];  Service: Orthopedics;  Laterality: Left;   TOTAL HIP ARTHROPLASTY Right 05/03/2021   Procedure: TOTAL HIP ARTHROPLASTY ANTERIOR APPROACH;  Surgeon: Rod Can, MD;  Location: [facility] ORS;  Service: Orthopedics;  Laterality: Right;   TOTAL SHOULDER ARTHROPLASTY Right 11/24/2015   Procedure: RIGHT TOTAL SHOULDER ARTHROPLASTY;  Surgeon: Netta Cedars, MD;  Location: [place];  Service: Orthopedics;  Laterality: Right;   TOTAL SHOULDER ARTHROPLASTY Left 10/19/2019   Procedure: REVERSE TOTAL SHOULDER ARTHROPLASTY;  Surgeon: Tania Ade, MD;  Location: [facility] ORS;  Service: Orthopedics;  Laterality: Left;   TRANSTHORACIC ECHOCARDIOGRAM  10/2016   EF 20-25%. Diffuse hypokinesis but akinesis of the entire inferoseptal wall and apical wall. Moderate biatrial enlargement. PA pressure estimated 64 mmHg.   WISDOM TOOTH EXTRACTION      Current Medications: Current Meds  Medication Sig   carvedilol (COREG) 25 MG tablet TAKE 1 TABLET (25 MG TOTAL) BY MOUTH 2 (TWO) TIMES DAILY WITH A MEAL.   diphenhydramine-acetaminophen (TYLENOL PM) 25-500 MG TABS tablet Take 2 tablets by mouth at bedtime.   ELIQUIS 5 MG TABS tablet TAKE 1 TABLET BY MOUTH TWICE A DAY   ENTRESTO 97-103 MG TAKE 1 TABLET BY MOUTH TWICE A DAY   FARXIGA 10 MG TABS tablet TAKE 1 TABLET BY MOUTH EVERY DAY BEFORE BREAKFAST   Multiple Vitamin (MULTIVITAMIN WITH MINERALS) TABS tablet Take 1 tablet by mouth in the morning.   rosuvastatin (CRESTOR) 20 MG tablet TAKE 1 TABLET BY MOUTH EVERY DAY   sertraline (ZOLOFT) 100 MG tablet Take 100 mg by mouth daily.   sildenafil (VIAGRA) 100 MG tablet TAKE 0.5 TABLETS (50 MG TOTAL) BY MOUTH AT  BEDTIME AS NEEDED FOR ERECTILE DYSFUNCTION.   spironolactone (ALDACTONE) 50 MG tablet Take 0.5 tablets (25 mg total) by mouth at bedtime.   torsemide (DEMADEX) 20 MG tablet Take 2 tablets (40 mg total) by mouth daily.   traZODone (DESYREL) 50 MG tablet TAKE 1 TABLET BY MOUTH EVERY DAY AT BEDTIME AS NEEDED     Allergies:   Cymbalta [duloxetine hcl] and Hydrocodone   Social History   Socioeconomic History   Marital status: Married    Spouse name: Not on file   Number of children: 3   Years of education: Not on file   Highest education level: Not on file  Occupational History   Not on file  Tobacco Use   Smoking status: Never   Smokeless tobacco: Never  Vaping Use   Vaping Use: Never used  Substance and Sexual Activity   Alcohol use: Yes    Alcohol/week: 12.0 standard drinks of alcohol    Types: 12 Cans of beer per week    Comment: per week   Drug use: No   Sexual activity: Yes    Partners: Female  Comment: gf only  Other Topics Concern   Not on file  Social History Narrative   Engaged 2022- long term GF/lives with him. 2 dogs. Close with 3 sons that work for wife- christopher 15 at [employer], [place] 21, Graham 25 in 2021.       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    Grew up in [place].    Social Determinants of Health   Financial Resource Strain: Not on file  Food Insecurity: Not on file  Transportation Needs: Not on file  Physical Activity: Not on file  Stress: Not on file  Social Connections: Not on file     Family History: The patient's family history includes Congenital heart disease in his sister; Dementia in his mother; Healthy in his brother, brother, brother, and sister; Lung cancer in his father; Prostate cancer in his brother.  ROS:   Please see the history of present illness.    All other systems reviewed and are negative.  EKGs/Labs/Other Studies Reviewed:     The following studies were reviewed today:  June 26, 2022 chest x-ray reviewed and shows a VVI ICD with the ventricular lead on the mid/high ventricular septum.  There is in October 2023 CT chest abdomen pelvis that suggests that the left subclavian venous system is open.  November 2023 VT device EGM   Recent Labs: 07/24/2022: Magnesium 2.2; NT-Pro BNP 516 08/22/2022: ALT 22; TSH 1.63 09/27/2022: Hemoglobin 12.9; Platelets 319 10/23/2022: B Natriuretic Peptide 625.0; BUN 13; Creatinine, Ser 0.99; Potassium 4.0; Sodium 134  Recent Lipid Panel    Component Value Date/Time   CHOL 144 08/22/2022 1225   CHOL 111 06/09/2018 1137   TRIG 199.0 (H) 08/22/2022 1225   HDL 55.40 08/22/2022 1225   HDL 53 06/09/2018 1137   CHOLHDL 3 08/22/2022 1225   VLDL 39.8 08/22/2022 1225   LDLCALC 48 08/22/2022 1225   LDLCALC 71 08/24/2020 1004    Physical Exam:    VS:  BP 110/68   Pulse 74   Ht '5\' 11"'$  (1.803 m)   Wt 276 lb 9.6 oz (125.5 kg)   SpO2 94%   BMI 38.58 kg/m     Wt Readings from Last 3 Encounters:  10/25/22 276 lb 9.6 oz (125.5 kg)  10/23/22 274 lb 6.4 oz (124.5 kg)  10/17/22 267 lb (121.1 kg)     GEN:  Well nourished, well developed in no acute distress CARDIAC: RRR, no murmurs, rubs, gallops.  ICD pocket well-healed PSYCHIATRIC:  Normal affect       ASSESSMENT:    1. Chronic combined systolic (congestive) and diastolic (congestive) heart failure (HCC)   2. Paroxysmal atrial fibrillation ([place])   3. VT (ventricular tachycardia) ([place])   4. Severe obesity (BMI 35.0-39.9) with comorbidity ([place])    PLAN:    In order of problems listed above:  #Chronic systolic heart failure NYHA class II-III.  Warm and dry on exam.  Follows with Dr. Aundra Dubin in the heart failure clinic. Last EF 30 to 35% in May 2023. QRS narrow on February 2024 EKG  Could potentially be a candidate for CCM although I would like to get his atrial lead placed first and allow him to heal.  With end-stage  CCM implant.  We did discuss CCM therapy during today's visit.  I will plan to get this type of procedure scheduled after our post atrial lead implant office visit.  #Ventricular tachycardia Long short secondary to  bradycardia.  Need to atrial lead to be able to set a lower rate of 60 bpm.  I discussed the atrial lead addition procedure in detail during today's visit including the risks.  We discussed the possibility that the left subclavian system would be occluded although I do not think this is likely given CT scan results as mentioned above.  He will need to hold his Eliquis for 3 days prior to atrial lead addition.  Risks, benefits, alternatives to atrial lead addition were discussed in detail with the patient today. The patient understands that the risks include but are not limited to bleeding, infection, pneumothorax, perforation, tamponade, vascular damage, renal failure, MI, stroke, death, and lead dislodgement and wishes to proceed.  We will therefore schedule device implantation at the next available time.    Medication Adjustments/Labs and Tests Ordered: Current medicines are reviewed at length with the patient today.  Concerns regarding medicines are outlined above.  Orders Placed This Encounter  Procedures   Basic metabolic panel   CBC with Differential/Platelet   No orders of the defined types were placed in this encounter.  I,Rachel Rivera,acting as a scribe for Vickie Epley, MD.,have documented all relevant documentation on the behalf of Vickie Epley, MD,as directed by  Vickie Epley, MD while in the presence of Vickie Epley, MD.  I, Vickie Epley, MD, have reviewed all documentation for this visit. The documentation on 10/25/22 for the exam, diagnosis, procedures, and orders are all accurate and complete.  Signed, Hilton Cork. Quentin Ore, MD, [facility], [facility] 10/25/2022 3:31 PM    Electrophysiology [facility]

## 2022-10-29 ENCOUNTER — Telehealth (HOSPITAL_COMMUNITY): Payer: Self-pay | Admitting: Emergency Medicine

## 2022-10-29 ENCOUNTER — Ambulatory Visit (INDEPENDENT_AMBULATORY_CARE_PROVIDER_SITE_OTHER): Payer: Managed Care, Other (non HMO)

## 2022-10-29 DIAGNOSIS — I428 Other cardiomyopathies: Secondary | ICD-10-CM | POA: Diagnosis not present

## 2022-10-29 LAB — CUP PACEART REMOTE DEVICE CHECK
Battery Remaining Longevity: 64 mo
Battery Remaining Percentage: 62 %
Battery Voltage: 2.98 V
Brady Statistic RV Percent Paced: 1 %
Date Time Interrogation Session: 20240213042838
HighPow Impedance: 95 Ohm
HighPow Impedance: 95 Ohm
Lead Channel Impedance Value: 530 Ohm
Lead Channel Pacing Threshold Amplitude: 0.75 V
Lead Channel Pacing Threshold Pulse Width: 0.5 ms
Lead Channel Sensing Intrinsic Amplitude: 11.8 mV
Lead Channel Setting Pacing Amplitude: 2.5 V
Lead Channel Setting Pacing Pulse Width: 0.5 ms
Lead Channel Setting Sensing Sensitivity: 0.5 mV
Pulse Gen Serial Number: 9824800
Zone Setting Status: 755011

## 2022-10-29 NOTE — Telephone Encounter (Signed)
Reaching out to patient to offer assistance regarding upcoming cardiac imaging study; pt verbalizes understanding of appt date/time, parking situation and where to check in, pre-test NPO status and medications ordered, and verified current allergies; name and call back number provided for further questions should they arise Shaquille Murdy RN Navigator Cardiac Imaging [facility] [phone] office [phone] cell 

## 2022-10-29 NOTE — Telephone Encounter (Signed)
Attempted to call patient regarding upcoming cardiac CT appointment. °Left message on voicemail with name and callback number °John Wence RN Navigator Cardiac Imaging °[facility] °[phone] Office °[phone] Cell ° °

## 2022-10-30 ENCOUNTER — Encounter: Payer: Self-pay | Admitting: Cardiology

## 2022-10-30 ENCOUNTER — Ambulatory Visit (HOSPITAL_COMMUNITY)
Admission: RE | Admit: 2022-10-30 | Discharge: 2022-10-30 | Disposition: A | Discharge status: Home / Self Care | Payer: Managed Care, Other (non HMO) | Source: Ambulatory Visit | Attending: Cardiovascular Disease | Admitting: Cardiovascular Disease

## 2022-10-30 ENCOUNTER — Other Ambulatory Visit: Payer: Self-pay | Admitting: Cardiovascular Disease

## 2022-10-30 DIAGNOSIS — I2510 Atherosclerotic heart disease of native coronary artery without angina pectoris: Secondary | ICD-10-CM

## 2022-10-30 DIAGNOSIS — R931 Abnormal findings on diagnostic imaging of heart and coronary circulation: Secondary | ICD-10-CM | POA: Insufficient documentation

## 2022-10-30 DIAGNOSIS — R079 Chest pain, unspecified: Secondary | ICD-10-CM | POA: Insufficient documentation

## 2022-10-30 MED ORDER — IOHEXOL 350 MG/ML SOLN
100.0000 mL | Freq: Once | INTRAVENOUS | Status: AC | PRN
  Administered 2022-10-30: 100 mL via INTRAVENOUS

## 2022-10-30 MED ORDER — NITROGLYCERIN 0.4 MG SL SUBL
SUBLINGUAL_TABLET | SUBLINGUAL | Status: AC
  Filled 2022-10-30: qty 2

## 2022-10-30 MED ORDER — NITROGLYCERIN 0.4 MG SL SUBL
0.8000 mg | SUBLINGUAL_TABLET | Freq: Once | SUBLINGUAL | Status: AC
  Administered 2022-10-30: 0.8 mg via SUBLINGUAL

## 2022-10-31 ENCOUNTER — Ambulatory Visit: Payer: Managed Care, Other (non HMO) | Admitting: Bariatrics

## 2022-10-31 ENCOUNTER — Ambulatory Visit (HOSPITAL_BASED_OUTPATIENT_CLINIC_OR_DEPARTMENT_OTHER)
Admission: RE | Admit: 2022-10-31 | Discharge: 2022-10-31 | Disposition: A | Discharge status: Home / Self Care | Payer: Managed Care, Other (non HMO) | Source: Ambulatory Visit | Attending: Cardiovascular Disease | Admitting: Cardiovascular Disease

## 2022-10-31 DIAGNOSIS — R931 Abnormal findings on diagnostic imaging of heart and coronary circulation: Secondary | ICD-10-CM | POA: Diagnosis not present

## 2022-10-31 DIAGNOSIS — R079 Chest pain, unspecified: Secondary | ICD-10-CM | POA: Diagnosis not present

## 2022-10-31 NOTE — Progress Notes (Signed)
Office: [phone]  /  Fax: [phone]  Initial Visit  John [name] was seen in clinic today to evaluate for obesity. He is interested in losing weight to improve overall health and reduce the risk of weight related complications. He presents today to review program treatment options, initial physical assessment, and evaluation.     He was referred by: Specialist: cardiologist  When asked what else they would they like to accomplish? He states: Adopt healthier eating patterns, Reduce number of medications, and Improve quality of life  When asked how has your weight affected you? He states: Contributed to medical problems, Contributed to orthopedic problems or mobility issues, Having fatigue, and Having poor endurance  Some associated conditions: Hypertension, Hyperlipidemia, and Heart disease  Contributing factors: Reduced physical activity  Weight promoting medications identified: None  Current nutrition plan: Low-carb and Portion control / smart choices  Current level of physical activity: None  Current or previous pharmacotherapy: None  Response to medication: Never tried medications   Past medical history includes:   Past Medical History:  Diagnosis Date   AICD (automatic cardioverter/defibrillator) present    St. Jude   Anxiety    Ativan   Chronic combined systolic and diastolic CHF, NYHA class 3 ([place]) 10/2016   Nonischemic cardiomyopathy. EF 20-25%.   Chronic left hip pain    Depression    history of   Dysrhythmia    A-FIB   GI bleed 03/2016   Headache    Hypertensive heart disease with combined systolic and diastolic congestive heart failure ([place]) 10/2016   Nonischemic cardiomyopathy ([place]) 10/2016   Echo with EF 20-25%. Cardiac catheterization with no CAD. LVEDP was 41 mmHg, PCWP 36 mmHg   Osteoarthritis    right shoulder   Right hand fracture    Seasonal allergies    Sleep apnea    wears a CPAP   Wears glasses      Objective:   BP (!) 99/58    Pulse 70   Temp 98.9 F (37.2 C)   Ht 5' 10"$  (1.778 m)   Wt 267 lb (121.1 kg)   SpO2 94%   BMI 38.31 kg/m  He was weighed on the bioimpedance scale: Body mass index is 38.31 kg/m.  Visceral Fat Rating: 25, Body Fat%: 41.3  General:  Alert, oriented and cooperative. Patient is in no acute distress.  Respiratory: Normal respiratory effort, no problems with respiration noted  Extremities: Normal range of motion.    Mental Status: Normal mood and affect. Normal behavior. Normal judgment and thought content.   Assessment and Plan:  1. Pure hypercholesterolemia Aaro is taking Crestor.  Plan: Continue Crestor. Will follow up with the cardiologist every 3 months.  2. Hyperglycemia He is taking [place].  Plan: Will do labs at the next visit.  3. Other fatigue History of PAF, aortic arthrosclerosis, chronic CHF  Plan: Will do an EKG at next visit.  - EKG 12-Lead; Future  4. Physical deconditioning Seeing cardiologist  Plan: PT referral for exercise Will talk with cardiologist  5. Generalized obesity 6. BMI 38.0-38.9,adult Handout on Protein Shakes given.     Obesity Treatment Plan:  He will work on garnering support from family and friends to begin weight loss journey. Work on eliminating or reducing the presence of highly processed, calorie dense foods in the home. Complete provided nutritional and psychosocial assessment questionnaire prior to his next visit.  Assess for cardiometabolic complications and nutritional deficiencies via fasting serologies.  Assess REE via indirect calorimetry to  guide the creation of a reduced calorie, high protein meal plan to promote loss of fat mass.   He will think about ideas on how to incorporate physical activity in their daily routine.  Desmon Vicknair will follow up in the next 1-2 weeks to review the above steps and continue evaluation and treatment.  Obesity Education Performed Today:  He was weighed on the  bioimpedance scale and results were discussed and documented in the synopsis.  We discussed obesity as a disease and the importance of a more detailed evaluation of all the factors contributing to the disease.  We discussed the importance of long term lifestyle changes which include nutrition, exercise and behavioral modifications as well as the importance of customizing this to his specific health and social needs.  We discussed the benefits of reaching a healthier weight to alleviate the symptoms of existing conditions and reduce the risks of the biomechanical, metabolic and psychological effects of obesity.  Frederik Hartsock appears to be in the action stage of change and states they are ready to start intensive lifestyle modifications and behavioral modifications.  30 minutes was spent today on this visit including the above counseling, pre-visit chart review, and post-visit documentation.  I, Kathlene November, BS, CMA, am acting as transcriptionist for [employer], DO.  Jearld Lesch, DO

## 2022-11-04 ENCOUNTER — Encounter: Payer: Self-pay | Admitting: Bariatrics

## 2022-11-05 ENCOUNTER — Other Ambulatory Visit: Payer: Self-pay | Admitting: Family Medicine

## 2022-11-07 ENCOUNTER — Telehealth (HOSPITAL_COMMUNITY): Payer: Self-pay | Admitting: Pharmacy Technician

## 2022-11-07 ENCOUNTER — Other Ambulatory Visit (HOSPITAL_COMMUNITY): Payer: Self-pay

## 2022-11-07 NOTE — Telephone Encounter (Signed)
Patient Advocate Encounter   Received notification from [employer] that prior authorization for Eliquis is required.   PA submitted on CoverMyMeds Key [id number] Status is pending   Will continue to follow.

## 2022-11-11 ENCOUNTER — Telehealth: Payer: Self-pay

## 2022-11-11 ENCOUNTER — Encounter (HOSPITAL_COMMUNITY)
Admission: RE | Disposition: A | Discharge status: Home / Self Care | Payer: Self-pay | Source: Ambulatory Visit | Attending: Cardiology

## 2022-11-11 ENCOUNTER — Other Ambulatory Visit: Payer: Self-pay

## 2022-11-11 ENCOUNTER — Ambulatory Visit (HOSPITAL_COMMUNITY): Payer: Commercial Managed Care - HMO

## 2022-11-11 ENCOUNTER — Ambulatory Visit (HOSPITAL_COMMUNITY)
Admission: RE | Admit: 2022-11-11 | Discharge: 2022-11-11 | Disposition: A | Discharge status: Home / Self Care | Payer: Commercial Managed Care - HMO | Source: Ambulatory Visit | Attending: Cardiology | Admitting: Cardiology

## 2022-11-11 DIAGNOSIS — Z95810 Presence of automatic (implantable) cardiac defibrillator: Secondary | ICD-10-CM

## 2022-11-11 DIAGNOSIS — E669 Obesity, unspecified: Secondary | ICD-10-CM | POA: Diagnosis not present

## 2022-11-11 DIAGNOSIS — I110 Hypertensive heart disease with heart failure: Secondary | ICD-10-CM | POA: Diagnosis not present

## 2022-11-11 DIAGNOSIS — Z7901 Long term (current) use of anticoagulants: Secondary | ICD-10-CM | POA: Diagnosis not present

## 2022-11-11 DIAGNOSIS — I428 Other cardiomyopathies: Secondary | ICD-10-CM | POA: Insufficient documentation

## 2022-11-11 DIAGNOSIS — I5022 Chronic systolic (congestive) heart failure: Secondary | ICD-10-CM | POA: Diagnosis not present

## 2022-11-11 DIAGNOSIS — Z6838 Body mass index (BMI) 38.0-38.9, adult: Secondary | ICD-10-CM | POA: Insufficient documentation

## 2022-11-11 DIAGNOSIS — Z4502 Encounter for adjustment and management of automatic implantable cardiac defibrillator: Secondary | ICD-10-CM | POA: Insufficient documentation

## 2022-11-11 DIAGNOSIS — G4730 Sleep apnea, unspecified: Secondary | ICD-10-CM | POA: Insufficient documentation

## 2022-11-11 DIAGNOSIS — I472 Ventricular tachycardia, unspecified: Secondary | ICD-10-CM | POA: Diagnosis not present

## 2022-11-11 DIAGNOSIS — I480 Paroxysmal atrial fibrillation: Secondary | ICD-10-CM | POA: Diagnosis not present

## 2022-11-11 DIAGNOSIS — I509 Heart failure, unspecified: Secondary | ICD-10-CM

## 2022-11-11 HISTORY — PX: LEAD REVISION/REPAIR: EP1213

## 2022-11-11 HISTORY — PX: PPM GENERATOR CHANGEOUT: EP1233

## 2022-11-11 LAB — CBC
HCT: 39.4 % (ref 39.0–52.0)
Hemoglobin: 13 g/dL (ref 13.0–17.0)
MCH: 33.3 pg (ref 26.0–34.0)
MCHC: 33 g/dL (ref 30.0–36.0)
MCV: 101 fL — ABNORMAL HIGH (ref 80.0–100.0)
Platelets: 252 10*3/uL (ref 150–400)
RBC: 3.9 MIL/uL — ABNORMAL LOW (ref 4.22–5.81)
RDW: 12.4 % (ref 11.5–15.5)
WBC: 9.5 10*3/uL (ref 4.0–10.5)
nRBC: 0 % (ref 0.0–0.2)

## 2022-11-11 SURGERY — PPM GENERATOR CHANGEOUT
Anesthesia: LOCAL

## 2022-11-11 MED ORDER — LIDOCAINE HCL 1 % IJ SOLN
INTRAMUSCULAR | Status: AC
  Filled 2022-11-11: qty 60

## 2022-11-11 MED ORDER — ACETAMINOPHEN 325 MG PO TABS
325.0000 mg | ORAL_TABLET | ORAL | Status: DC | PRN
  Administered 2022-11-11: 650 mg via ORAL
  Filled 2022-11-11: qty 2

## 2022-11-11 MED ORDER — CEFAZOLIN SODIUM-DEXTROSE 2-4 GM/100ML-% IV SOLN: 2.0000 g | INTRAVENOUS | Status: DC

## 2022-11-11 MED ORDER — SODIUM CHLORIDE 0.9 % IV SOLN: INTRAVENOUS | Status: DC

## 2022-11-11 MED ORDER — FENTANYL CITRATE (PF) 100 MCG/2ML IJ SOLN
INTRAMUSCULAR | Status: DC | PRN
  Administered 2022-11-11 (×3): 25 ug via INTRAVENOUS

## 2022-11-11 MED ORDER — SODIUM CHLORIDE 0.9 % IV SOLN
INTRAVENOUS | Status: AC
  Filled 2022-11-11: qty 2

## 2022-11-11 MED ORDER — MIDAZOLAM HCL 5 MG/5ML IJ SOLN
INTRAMUSCULAR | Status: AC
  Filled 2022-11-11: qty 5

## 2022-11-11 MED ORDER — MIDAZOLAM HCL 5 MG/5ML IJ SOLN
INTRAMUSCULAR | Status: DC | PRN
  Administered 2022-11-11 (×3): 1 mg via INTRAVENOUS

## 2022-11-11 MED ORDER — SODIUM CHLORIDE 0.9 % IV SOLN
80.0000 mg | INTRAVENOUS | Status: AC
  Administered 2022-11-11: 80 mg

## 2022-11-11 MED ORDER — HEPARIN (PORCINE) IN NACL 1000-0.9 UT/500ML-% IV SOLN
INTRAVENOUS | Status: DC | PRN
  Administered 2022-11-11: 500 mL

## 2022-11-11 MED ORDER — DIPHENHYDRAMINE HCL 25 MG PO CAPS
25.0000 mg | ORAL_CAPSULE | Freq: Once | ORAL | Status: AC
  Administered 2022-11-11: 25 mg via ORAL
  Filled 2022-11-11 (×2): qty 1

## 2022-11-11 MED ORDER — IOHEXOL 350 MG/ML SOLN
INTRAVENOUS | Status: DC | PRN
  Administered 2022-11-11: 15 mL

## 2022-11-11 MED ORDER — CEFAZOLIN IN SODIUM CHLORIDE 3-0.9 GM/100ML-% IV SOLN
3.0000 g | INTRAVENOUS | Status: AC
  Administered 2022-11-11: 3 g via INTRAVENOUS
  Filled 2022-11-11: qty 100

## 2022-11-11 MED ORDER — FENTANYL CITRATE (PF) 100 MCG/2ML IJ SOLN
INTRAMUSCULAR | Status: AC
  Filled 2022-11-11: qty 2

## 2022-11-11 MED ORDER — APIXABAN 5 MG PO TABS: 5.0000 mg | ORAL_TABLET | Freq: Two times a day (BID) | ORAL | 3 refills | Status: DC

## 2022-11-11 MED ORDER — CHLORHEXIDINE GLUCONATE 4 % EX LIQD
4.0000 | Freq: Once | CUTANEOUS | Status: DC
  Filled 2022-11-11: qty 60

## 2022-11-11 MED ORDER — LIDOCAINE HCL (PF) 1 % IJ SOLN
INTRAMUSCULAR | Status: DC | PRN
  Administered 2022-11-11: 60 mL

## 2022-11-11 MED ORDER — ONDANSETRON HCL 4 MG/2ML IJ SOLN: 4.0000 mg | Freq: Four times a day (QID) | INTRAMUSCULAR | Status: DC | PRN

## 2022-11-11 SURGICAL SUPPLY — 10 items
CABLE SURGICAL S-101-97-12 (CABLE) ×1 IMPLANT
ICD GALLANT DR CDDRA500Q (ICD Generator) IMPLANT
LEAD ULTIPACE 52 LPA1231/52 (Lead) IMPLANT
MAT PREVALON FULL STRYKER (MISCELLANEOUS) IMPLANT
PAD DEFIB RADIO PHYSIO CONN (PAD) ×1 IMPLANT
POUCH AIGIS-R ANTIBACT ICD (Mesh General) ×1 IMPLANT
POUCH AIGIS-R ANTIBACT ICD LRG (Mesh General) IMPLANT
SHEATH 7FR PRELUDE SNAP 13 (SHEATH) IMPLANT
SHEATH PROBE COVER 6X72 (BAG) IMPLANT
TRAY PACEMAKER INSERTION (PACKS) ×1 IMPLANT

## 2022-11-11 NOTE — Interval H&P Note (Signed)
History and Physical Interval Note:  11/11/2022 1:19 PM  John [name]  has presented today for surgery, with the diagnosis of bradycardia.  The various methods of treatment have been discussed with the patient and family. After consideration of risks, benefits and other options for treatment, the patient has consented to  Procedure(s): PPM GENERATOR CHANGEOUT (N/A) LEAD atrial lead insertion (N/A) as a surgical intervention.  The patient's history has been reviewed, patient examined, no change in status, stable for surgery.  I have reviewed the patient's chart and labs.  Questions were answered to the patient's satisfaction.     Shateria Paternostro T Milda Lindvall

## 2022-11-11 NOTE — Telephone Encounter (Signed)
Alert received from [employer]:  Device alert for VT/VF with successful HV therapy Event occurred 2/24 @ 11:34, EGM shows abrupt onset of sustained VT/VF, rate 250, ATP delivered x1 with no change, HV therapy 30J converting to regular VS.  Discussed with Dr. Quentin Ore.  Will plan to have Pt admitted today for atrial lead insertion. Outreach made to Pt.  He is aware and in agreement with plan. Has not had breakfast or AM meds.  Advised no intake this AM.  He will check in to Admitting at 12:00 pm.  Pt was alert for shock.        Example of a NSVT episode:

## 2022-11-11 NOTE — Discharge Instructions (Signed)
After Your Pacemaker   You have a Abbott Pacemaker  ACTIVITY Do not lift your arm above shoulder height for 1 week after your procedure. After 7 days, you may progress as below.  You should remove your sling 24 hours after your procedure, unless otherwise instructed by your provider.     Monday November 18, 2022  Tuesday November 19, 2022 Wednesday November 20, 2022 Thursday November 21, 2022   Do not lift, push, pull, or carry anything over 10 pounds with the affected arm until 6 weeks (Monday December 23, 2022 ) after your procedure.   You may drive AFTER your wound check, unless you have been told otherwise by your provider.   Ask your healthcare provider when you can go back to work   INCISION/Dressing If you are on a blood thinner such as Coumadin, Xarelto, Eliquis, Plavix, or Pradaxa please confirm with your provider when this should be resumed.   If large square, outer bandage is left in place, this can be removed after 24 hours from your procedure. Do not remove steri-strips or glue as below.   Monitor your Pacemaker site for redness, swelling, and drainage. Call the device clinic at [phone] if you experience these symptoms or fever/chills.  If your incision is sealed with Steri-strips or staples, you may shower 7 days after your procedure or when told by your provider. Do not remove the steri-strips or let the shower hit directly on your site. You may wash around your site with soap and water.    If you were discharged in a sling, please do not wear this during the day more than 48 hours after your surgery unless otherwise instructed. This may increase the risk of stiffness and soreness in your shoulder.   Avoid lotions, ointments, or perfumes over your incision until it is well-healed.  You may use a hot tub or a pool AFTER your wound check appointment if the incision is completely closed.  Pacemaker Alerts:  Some alerts are vibratory and others beep. These are NOT emergencies. Please  call our office to let us know. If this occurs at night or on weekends, it can wait until the next business day. Send a remote transmission.  If your device is capable of reading fluid status (for heart failure), you will be offered monthly monitoring to review this with you.   DEVICE MANAGEMENT Remote monitoring is used to monitor your pacemaker from home. This monitoring is scheduled every 91 days by our office. It allows [place] to keep an eye on the functioning of your device to ensure it is working properly. You will routinely see your Electrophysiologist annually (more often if necessary).   You should receive your ID card for your new device in 4-8 weeks. Keep this card with you at all times once received. Consider wearing a medical alert bracelet or necklace.  Your Pacemaker may be MRI compatible. This will be discussed at your next office visit/wound check.  You should avoid contact with strong electric or magnetic fields.   Do not use amateur (ham) radio equipment or electric (arc) welding torches. MP3 player headphones with magnets should not be used. Some devices are safe to use if held at least 12 inches (30 cm) from your Pacemaker. These include power tools, lawn mowers, and speakers. If you are unsure if something is safe to use, ask your health care provider.  When using your cell phone, hold it to the ear that is on the opposite side from the  Pacemaker. Do not leave your cell phone in a pocket over the Pacemaker.  You may safely use electric blankets, heating pads, computers, and microwave ovens.  Call the office right away if: You have chest pain. You feel more short of breath than you have felt before. You feel more light-headed than you have felt before. Your incision starts to open up.  This information is not intended to replace advice given to you by your health care provider. Make sure you discuss any questions you have with your health care provider.

## 2022-11-11 NOTE — Progress Notes (Addendum)
Client c/o back itching after cleaned with chlorhexidine; Jonni Sanger, [place] notified and order noted and med given

## 2022-11-12 ENCOUNTER — Encounter (HOSPITAL_COMMUNITY): Payer: Self-pay | Admitting: Cardiology

## 2022-11-13 ENCOUNTER — Telehealth: Payer: Self-pay | Admitting: Cardiology

## 2022-11-13 NOTE — Telephone Encounter (Signed)
Advanced Heart Failure Patient Advocate Encounter  Prior Authorization for Eliquis has been approved.    PA# :[id number] Effective dates: 11/07/22 through 11/07/23  Charlann Boxer, CPhT

## 2022-11-13 NOTE — Telephone Encounter (Signed)
Pt wife states pt pacemaker incision bandage is coming off and she is unsure what to do with it. Please advise, pt coming for a wound check on Friday. She states it is not bleeding or oozing.

## 2022-11-13 NOTE — Telephone Encounter (Signed)
Pt came in for assessment of pacemaker pressure dressing.  Pt states the adhesive was making him itch and the pressure dressing was not secured to his torso.  Pt admits he removed it d/t itching.  Pressure dressing removed.  Steristrips are clean and dry.  Slight edema noted with bruising that is in the healing stages.  No active bleeding noted.  Discussed with Dr. Lovena Le.  Ok to leave pressure dressing off.  Continue to hold Eliquis until Monday as previously instructed.  STRICT wound precautions given.  Direct # given to device clinic for any changes noted to site.  Family indicate understanding.  Cancelled Friday's wound check for pressure dressing removal.  Reiterated wound check follow up appt March 7 at 9:20 am.  Rediscussed arm restrictions.  All questions answered.  Family indicate understanding.

## 2022-11-14 ENCOUNTER — Ambulatory Visit: Payer: Managed Care, Other (non HMO) | Admitting: Bariatrics

## 2022-11-15 ENCOUNTER — Encounter: Payer: Commercial Managed Care - HMO | Admitting: Family Medicine

## 2022-11-15 ENCOUNTER — Ambulatory Visit: Payer: Managed Care, Other (non HMO)

## 2022-11-18 ENCOUNTER — Other Ambulatory Visit: Payer: Managed Care, Other (non HMO)

## 2022-11-21 ENCOUNTER — Ambulatory Visit: Payer: Commercial Managed Care - HMO | Attending: Orthopedic Surgery

## 2022-11-21 DIAGNOSIS — I428 Other cardiomyopathies: Secondary | ICD-10-CM | POA: Diagnosis not present

## 2022-11-21 LAB — CUP PACEART INCLINIC DEVICE CHECK
Battery Remaining Longevity: 76 mo
Brady Statistic RA Percent Paced: 86 %
Brady Statistic RV Percent Paced: 4.9 %
Date Time Interrogation Session: 20240307133125
HighPow Impedance: 67.5 Ohm
Implantable Lead Connection Status: 753985
Implantable Lead Connection Status: 753985
Implantable Lead Implant Date: 20190821
Implantable Lead Implant Date: 20240226
Implantable Lead Location: 753859
Implantable Lead Location: 753860
Implantable Lead Model: 7122
Implantable Pulse Generator Implant Date: 20240226
Lead Channel Impedance Value: 475 Ohm
Lead Channel Impedance Value: 500 Ohm
Lead Channel Pacing Threshold Amplitude: 0.75 V
Lead Channel Pacing Threshold Amplitude: 0.75 V
Lead Channel Pacing Threshold Amplitude: 0.75 V
Lead Channel Pacing Threshold Amplitude: 0.75 V
Lead Channel Pacing Threshold Pulse Width: 0.5 ms
Lead Channel Pacing Threshold Pulse Width: 0.5 ms
Lead Channel Pacing Threshold Pulse Width: 0.5 ms
Lead Channel Pacing Threshold Pulse Width: 0.5 ms
Lead Channel Sensing Intrinsic Amplitude: 12 mV
Lead Channel Sensing Intrinsic Amplitude: 5 mV
Lead Channel Setting Pacing Amplitude: 2.5 V
Lead Channel Setting Pacing Amplitude: 3.5 V
Lead Channel Setting Pacing Pulse Width: 0.5 ms
Lead Channel Setting Sensing Sensitivity: 0.5 mV
Pulse Gen Serial Number: 211012451
Zone Setting Status: 755011

## 2022-11-21 NOTE — Patient Instructions (Signed)

## 2022-11-21 NOTE — Progress Notes (Signed)
Wound check appointment. Steri-strips removed. Wound without redness or edema. Incision edges approximated, wound well healed. Normal device function. Thresholds, sensing, and impedances consistent with implant measurements. Device programmed at 3.5V for extra safety margin on acute RA lead until 3 month visit. Histogram distribution appropriate for patient and level of activity. AT/AF burden <1%, no ventricular arrhythmias noted. Patient educated about wound care, arm mobility, lifting restrictions, shock plan. ROV in 3 months with implanting physician.

## 2022-11-25 ENCOUNTER — Telehealth: Payer: Self-pay

## 2022-11-25 NOTE — Telephone Encounter (Signed)
Patient called in wanting to know when can he go back to seeing his chiropractor.

## 2022-11-29 NOTE — Progress Notes (Signed)
Remote ICD transmission.   

## 2022-12-02 ENCOUNTER — Encounter (HOSPITAL_COMMUNITY): Payer: Self-pay

## 2022-12-02 ENCOUNTER — Ambulatory Visit (HOSPITAL_COMMUNITY): Admit: 2022-12-02 | Payer: Managed Care, Other (non HMO) | Admitting: Cardiology

## 2022-12-02 SURGERY — LEAD INSERTION

## 2022-12-03 NOTE — Telephone Encounter (Signed)
The patient is calling again wanting to know when he can start back going to the chiropractor. I let him know that the nurse was waiting on Dr. Quentin Ore to respond. Once Dr. Quentin Ore respond someone will give him a call back.

## 2022-12-04 NOTE — Telephone Encounter (Signed)
Per Dr. Quentin Ore patient is okay to see chiropractor as long as they are not manipulating area of ICD. Patient is aware.

## 2022-12-09 ENCOUNTER — Encounter: Payer: Self-pay | Admitting: Cardiovascular Disease

## 2022-12-09 ENCOUNTER — Ambulatory Visit: Payer: Commercial Managed Care - HMO | Attending: Cardiovascular Disease | Admitting: Cardiovascular Disease

## 2022-12-09 VITALS — BP 104/70 | HR 74 | Ht 71.0 in | Wt 272.0 lb

## 2022-12-09 DIAGNOSIS — I5042 Chronic combined systolic (congestive) and diastolic (congestive) heart failure: Secondary | ICD-10-CM | POA: Diagnosis not present

## 2022-12-09 DIAGNOSIS — D6869 Other thrombophilia: Secondary | ICD-10-CM | POA: Diagnosis not present

## 2022-12-09 DIAGNOSIS — I480 Paroxysmal atrial fibrillation: Secondary | ICD-10-CM

## 2022-12-09 DIAGNOSIS — N522 Drug-induced erectile dysfunction: Secondary | ICD-10-CM

## 2022-12-09 DIAGNOSIS — I2510 Atherosclerotic heart disease of native coronary artery without angina pectoris: Secondary | ICD-10-CM

## 2022-12-09 DIAGNOSIS — I472 Ventricular tachycardia, unspecified: Secondary | ICD-10-CM | POA: Diagnosis not present

## 2022-12-09 DIAGNOSIS — G4733 Obstructive sleep apnea (adult) (pediatric): Secondary | ICD-10-CM

## 2022-12-09 DIAGNOSIS — E7800 Pure hypercholesterolemia, unspecified: Secondary | ICD-10-CM

## 2022-12-09 DIAGNOSIS — Z95810 Presence of automatic (implantable) cardiac defibrillator: Secondary | ICD-10-CM

## 2022-12-09 MED ORDER — CARVEDILOL 25 MG PO TABS: ORAL_TABLET | ORAL | 3 refills | Status: DC

## 2022-12-09 MED ORDER — SILDENAFIL CITRATE 100 MG PO TABS: 100.0000 mg | ORAL_TABLET | Freq: Every evening | ORAL | 0 refills | Status: DC | PRN

## 2022-12-09 NOTE — Patient Instructions (Signed)
Medication Instructions:  Carvedilol 12.5mg  every morning and 25mg  every evening *If you need a refill on your cardiac medications before your next appointment, please call your pharmacy*   Follow-Up: At [facility], you and your health needs are our priority.  As part of our continuing mission to provide you with exceptional heart care, we have created designated Provider Care Teams.  These Care Teams include your primary Cardiologist (physician) and Advanced Practice Providers (APPs -  Physician Assistants and Nurse Practitioners) who all work together to provide you with the care you need, when you need it.  We recommend signing up for the patient portal called "MyChart".  Sign up information is provided on this After Visit Summary.  MyChart is used to connect with patients for Virtual Visits (Telemedicine).  Patients are able to view lab/test results, encounter notes, upcoming appointments, etc.  Non-urgent messages can be sent to your provider as well.   To learn more about what you can do with MyChart, go to [email or web address].    Your next appointment:   5 month(s)  Provider:   Dr Sallyanne Kuster

## 2022-12-09 NOTE — Progress Notes (Signed)
Cardiology Office Note:    Date:  12/10/2022   ID:  Dola Factor, DOB 1961/07/18, MRN [id number]  PCP:  Marin Olp, MD  Cardiologist:  Reece Levy (CHF)  Referring MD: Marin Olp, MD   No chief complaint on file.    History of Present Illness:    John [name] is a 62 y.o. male with a hx of severe nonischemic cardiomyopathy (with left ventricular ejection fraction as low as 15-20%, most recently estimated to be 25-30 % by an echo 01/16/2021) , paroxysmal atrial fibrillation, polymorphic ventricular tachycardia, obstructive sleep apnea on CPAP and severe obesity.  He had an appropriate defibrillator shock for polymorphic ventricular tachycardia 226 bpm on 07/21/2022.  Several other brief episodes of nonsustained polymorphic tachycardia were seen, all of them seemed to have a pause mediated onset.    Coronary CT angiography 10/30/2022 did not show evidence of significant coronary stenoses, although he did have moderate stenoses in the proximal LAD and first diagonal arteries (50 to 69%) and mild stenoses in the mid circumflex, OM 2, mid RCA (25-49%) and the calcium score was markedly elevated at 1750 (98th percentile for age and gender).  None of the coronary lesions were significant by FFR.  Aortic atherosclerosis and a normal caliber aorta was incidentally noted.  He underwent successful elective cardioversion for persistent atrial fibrillation 10/07/2022.  He underwent upgrade of his ICD to a dual-chamber device to allow atrial pacing 11/11/2022 (Dr. Quentin Ore, [facility], new atrial lead LPA 69, old RV ICD lead Durata 7122Q).  Has not had any new defibrillator shocks and denies dizziness, syncope, palpitations.  He has not had chest pain or shortness of breath with usual activity (seems to have NYHA functional class II exertional dyspnea) and denies orthopnea, PND, lower extremity edema.  He has problems with erectile dysfunction and asks whether he can restart  taking sildenafil.  He is compliant with anticoagulation with Eliquis and has not had any falls, serious injuries or bleeding problems.    He is on maximum dose of Entresto as well as spironolactone, Farxiga and carvedilol (unclear what dose of carvedilol he is taking (it sounds like he is taking 25 mg in the morning and 12.5 mg in the evening).  We had to cut back on his dose of carvedilol due to complaints of orthostatic hypotension.  Does not have symptoms of intermittent claudication.  He reports cutting back on alcohol but still drinks beer.  He hesitates to quantify, with his wife at the bedside.  Interrogation of his new defibrillator shows normal function.  He has 87% atrial pacing and only 5% ventricular pacing.  There have been no new episodes of atrial fibrillation.  The thoracic impedance/corvue has begun recording but is not yet relevant.  The atrial lead outputs are still set at high" until his follow-up visit with Dr. Quentin Ore which is scheduled on June 21.  He has not had any episodes of high ventricular rates since the new device was implanted.  He has a history of a lumbar vertebroplasty and right total hip repair by Dr. Lyla Glassing and Dr. Rolena Infante respectively.  No cardiac issues during those surgeries.  He reports compliance with CPAP and denies daytime hypersomnolence.  He is on maximum dose Entresto and carvedilol and also takes [place] and spironolactone.    Past Medical History:  Diagnosis Date   AICD (automatic cardioverter/defibrillator) present    [employer]   Anxiety    Ativan   Chronic combined systolic and diastolic CHF,  NYHA class 3 ([place]) 10/2016   Nonischemic cardiomyopathy. EF 20-25%.   Chronic left hip pain    Depression    history of   Dysrhythmia    A-FIB   GI bleed 03/2016   Headache    Hypertensive heart disease with combined systolic and diastolic congestive heart failure ([place]) 10/2016   Nonischemic cardiomyopathy ([place]) 10/2016   Echo with EF 20-25%.  Cardiac catheterization with no CAD. LVEDP was 41 mmHg, PCWP 36 mmHg   Osteoarthritis    right shoulder   Right hand fracture    Seasonal allergies    Sleep apnea    wears a CPAP   Wears glasses     Past Surgical History:  Procedure Laterality Date   CARDIAC CATHETERIZATION     CARDIOVERSION N/A 09/01/2019   Procedure: CARDIOVERSION;  Surgeon: Larey Dresser, MD;  [facility] ENDOSCOPY;  Service: Cardiovascular;  Laterality: N/A;   CARDIOVERSION N/A 10/07/2022   Procedure: CARDIOVERSION;  Surgeon: Berniece Salines, DO;  Location: [facility] ENDOSCOPY;  Service: Cardiovascular;  Laterality: N/A;   ESOPHAGOGASTRODUODENOSCOPY (EGD) WITH PROPOFOL N/A 03/20/2016   Procedure: ESOPHAGOGASTRODUODENOSCOPY (EGD) WITH PROPOFOL;  Surgeon: Wilford Corner, MD;  Location: [facility] ENDOSCOPY;  Service: Endoscopy;  Laterality: N/A;   ICD IMPLANT N/A 05/06/2018   Procedure: ICD IMPLANT;  Surgeon: Sanda Klein, MD;  Location: [place] CV LAB;  Service: Cardiovascular;  Laterality: N/A;   IRRIGATION AND DEBRIDEMENT SHOULDER Left 11/11/2019   Procedure: LEFT SHOULDER IRRIGATION AND DEBRIDEMENT WITH POLY EXCHANGE;  Surgeon: Tania Ade, MD;  Location: [facility] ORS;  Service: Orthopedics;  Laterality: Left;   KYPHOPLASTY N/A 02/22/2021   Procedure: KYPHOPLASTY T12;  Surgeon: Melina Schools, MD;  Location: [place];  Service: Orthopedics;  Laterality: N/A;  90 mins   LEAD REVISION/REPAIR N/A 11/11/2022   Procedure: LEAD atrial lead insertion;  Surgeon: Vickie Epley, MD;  Location: [place] CV LAB;  Service: Cardiovascular;  Laterality: N/A;   ORIF WRIST FRACTURE Left 02/04/2022   Procedure: OPEN REDUCTION INTERNAL FIXATION (ORIF) WRIST FRACTURE;  Surgeon: Iran Planas, MD;  Location: [place];  Service: Orthopedics;  Laterality: Left;  with IV sedation   PPM GENERATOR CHANGEOUT N/A 11/11/2022   Procedure: PPM GENERATOR CHANGEOUT;  Surgeon: Vickie Epley, MD;  Location: [place] CV LAB;  Service: Cardiovascular;   Laterality: N/A;   RIGHT/LEFT HEART CATH AND CORONARY ANGIOGRAPHY N/A 10/21/2016   Procedure: Right/Left Heart Cath and Coronary Angiography;  Surgeon: Lorretta Harp, MD;  Location: [place] CV LAB;  Service: Cardiovascular: Angiographically normal coronary arteries. PCWP 33-36 mmHg, LVEDP 41 mmHg. PA pressure 60/35, mean 46 mmHg.  Cardiac output/cardiac index-3.93 /1.76 Kathlen [place]), 3.49/1.57 (thermodilution)   TEE WITHOUT CARDIOVERSION N/A 09/01/2019   Procedure: TRANSESOPHAGEAL ECHOCARDIOGRAM (TEE);  Surgeon: Larey Dresser, MD;  Location: [facility] ENDOSCOPY;  Service: Cardiovascular;  Laterality: N/A;   TOTAL HIP ARTHROPLASTY Left 03/27/2015   Procedure: LEFT TOTAL HIP ARTHROPLASTY ANTERIOR APPROACH;  Surgeon: Rod Can, MD;  Location: [place];  Service: Orthopedics;  Laterality: Left;   TOTAL HIP ARTHROPLASTY Right 05/03/2021   Procedure: TOTAL HIP ARTHROPLASTY ANTERIOR APPROACH;  Surgeon: Rod Can, MD;  Location: [facility] ORS;  Service: Orthopedics;  Laterality: Right;   TOTAL SHOULDER ARTHROPLASTY Right 11/24/2015   Procedure: RIGHT TOTAL SHOULDER ARTHROPLASTY;  Surgeon: Netta Cedars, MD;  Location: [place];  Service: Orthopedics;  Laterality: Right;   TOTAL SHOULDER ARTHROPLASTY Left 10/19/2019   Procedure: REVERSE TOTAL SHOULDER ARTHROPLASTY;  Surgeon: Tania Ade, MD;  Location: [facility] ORS;  Service: Orthopedics;  Laterality: Left;   TRANSTHORACIC ECHOCARDIOGRAM  10/2016   EF 20-25%. Diffuse hypokinesis but akinesis of the entire inferoseptal wall and apical wall. Moderate biatrial enlargement. PA pressure estimated 64 mmHg.   WISDOM TOOTH EXTRACTION      Current Medications: Current Meds  Medication Sig   apixaban (ELIQUIS) 5 MG TABS tablet Take 1 tablet (5 mg total) by mouth 2 (two) times daily. Restart 3/3 with the am dose.   diphenhydramine-acetaminophen (TYLENOL PM) 25-500 MG TABS tablet Take 2 tablets by mouth at bedtime.   ENTRESTO 97-103 MG TAKE 1 TABLET BY MOUTH TWICE A DAY    FARXIGA 10 MG TABS tablet TAKE 1 TABLET BY MOUTH EVERY DAY BEFORE BREAKFAST   Multiple Vitamin (MULTIVITAMIN WITH MINERALS) TABS tablet Take 1 tablet by mouth in the morning.   rosuvastatin (CRESTOR) 20 MG tablet TAKE 1 TABLET BY MOUTH EVERY DAY   sertraline (ZOLOFT) 100 MG tablet Take 100 mg by mouth daily.   spironolactone (ALDACTONE) 50 MG tablet Take 0.5 tablets (25 mg total) by mouth at bedtime.   torsemide (DEMADEX) 20 MG tablet Take 2 tablets (40 mg total) by mouth daily.   traZODone (DESYREL) 100 MG tablet Take 100 mg by mouth daily.   traZODone (DESYREL) 50 MG tablet TAKE 1 TABLET BY MOUTH EVERY DAY AT BEDTIME AS NEEDED   [DISCONTINUED] carvedilol (COREG) 25 MG tablet TAKE 1 TABLET (25 MG TOTAL) BY MOUTH 2 (TWO) TIMES DAILY WITH A MEAL.   [DISCONTINUED] sildenafil (VIAGRA) 100 MG tablet TAKE 0.5 TABLETS (50 MG TOTAL) BY MOUTH AT BEDTIME AS NEEDED FOR ERECTILE DYSFUNCTION.     Allergies:   Chlorhexidine, Cymbalta [duloxetine hcl], and Hydrocodone   Social History   Socioeconomic History   Marital status: Married    Spouse name: Not on file   Number of children: 3   Years of education: Not on file   Highest education level: Not on file  Occupational History   Not on file  Tobacco Use   Smoking status: Never   Smokeless tobacco: Never  Vaping Use   Vaping Use: Never used  Substance and Sexual Activity   Alcohol use: Yes    Alcohol/week: 12.0 standard drinks of alcohol    Types: 12 Cans of beer per week    Comment: per week   Drug use: No   Sexual activity: Yes    Partners: Female    Comment: gf only  Other Topics Concern   Not on file  Social History Narrative   Engaged 2022- long term GF/lives with him. 2 dogs. Close with 3 sons that work for wife- christopher 15 at [employer], [place] 21, [place] 25 in 2021.       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    Grew up in [place].     Social Determinants of Health   Financial Resource Strain: Not on file  Food Insecurity: Not on file  Transportation Needs: Not on file  Physical Activity: Not on file  Stress: Not on file  Social Connections: Not on file     Family History: The patient's family history includes Congenital heart disease in his sister; Dementia in his mother; Healthy in his brother, brother, brother, and sister; Lung cancer in his father; Prostate cancer in his brother.  His son also had a "hole in his heart" there was repaired surgically.  ROS:   Please see the history of present illness.  All other systems are reviewed and are negative  EKGs/Labs/Other Studies Reviewed:    The following studies were reviewed today: Notes from last heart failure visit with heart failure service from May 2022  EKG:  EKG is not ordered today.  The intracardiac electrogram shows atrial paced, ventricular sensed rhythm.  The underlying sinus rate is about 50 bpm.  Recent Labs: 07/24/2022: Magnesium 2.2; NT-Pro BNP 516 08/22/2022: ALT 22; TSH 1.63 10/23/2022: B Natriuretic Peptide 625.0; BUN 13; Creatinine, Ser 0.99; Potassium 4.0; Sodium 134 11/11/2022: Hemoglobin 13.0; Platelets 252  Recent Lipid Panel    Component Value Date/Time   CHOL 144 08/22/2022 1225   CHOL 111 06/09/2018 1137   TRIG 199.0 (H) 08/22/2022 1225   HDL 55.40 08/22/2022 1225   HDL 53 06/09/2018 1137   CHOLHDL 3 08/22/2022 1225   VLDL 39.8 08/22/2022 1225   LDLCALC 48 08/22/2022 1225   LDLCALC 71 08/24/2020 1004    Physical Exam:    VS:  BP 104/70 (BP Location: Left Arm, Patient Position: Sitting, Cuff Size: Large)   Pulse 74   Ht 5\' 11"  (1.803 m)   Wt 272 lb (123.4 kg)   SpO2 96%   BMI 37.94 kg/m     Wt Readings from Last 3 Encounters:  12/09/22 272 lb (123.4 kg)  11/11/22 270 lb (122.5 kg)  10/25/22 276 lb 9.6 oz (125.5 kg)     General: Alert, oriented x3, no distress, severely obese.  Well-healed left subclavian ICD  site Head: no evidence of trauma, PERRL, EOMI, no exophtalmos or lid lag, no myxedema, no xanthelasma; normal ears, nose and oropharynx Neck: normal jugular venous pulsations and no hepatojugular reflux; brisk carotid pulses without delay and no carotid bruits Chest: clear to auscultation, no signs of consolidation by percussion or palpation, normal fremitus, symmetrical and full respiratory excursions Cardiovascular: normal position and quality of the apical impulse, regular rhythm, normal first and second heart sounds, no murmurs, rubs or gallops Abdomen: no tenderness or distention, no masses by palpation, no abnormal pulsatility or arterial bruits, normal bowel sounds, no hepatosplenomegaly Extremities: no clubbing, cyanosis or edema; 2+ radial, ulnar and brachial pulses bilaterally; 2+ right femoral, posterior tibial and dorsalis pedis pulses; 2+ left femoral, posterior tibial and dorsalis pedis pulses; no subclavian or femoral bruits Neurological: grossly nonfocal Psych: Normal mood and affect      ASSESSMENT:    1. VT (ventricular tachycardia) (HCC)   2. Paroxysmal atrial fibrillation ([place])   3. Acquired thrombophilia ([place])   4. Chronic combined systolic (congestive) and diastolic (congestive) heart failure ([place])   5. Coronary artery disease involving native coronary artery of native heart without angina pectoris   6. ICD (implantable cardioverter-defibrillator) in place   7. Severe obesity (BMI 35.0-39.9) with comorbidity ([place])   8. Hypercholesterolemia   9. OSA (obstructive sleep apnea)   10. Drug-induced erectile dysfunction       PLAN:    In order of problems listed above:  VT: No recurrence since implantation of the new ICD with a right atrial lead and avoidance of bradycardia.  He is not on true antiarrhythmics other than the carvedilol.   AFib: Infrequent episodes of atrial fibrillation requiring cardioversion once in 2020 and once in early 2024.  CHA2DS2-VASc 4 (CHF,  CAD, HTN). Anticoagulation: Tolerated without bleeding problems. CHF: Most recent LVEF 30-35% with global hypokinesis.  Although he has mild coronary disease, he has primarily nonischemic cardiomyopathy, MRI raised suspicion for prior myocarditis, but quite possibly at least in  part related to alcohol consumption. NYHA functional class II, on comprehensive guideline-directed medical therapy.  Advised changing the carvedilol to take the higher dose at night and the lower dose in the morning.  Reviewed sodium restriction, daily weight monitoring, signs and symptoms of heart failure exacerbation.  Last seen in the heart failure clinic by Dr. Trey Paula 10/23/2022. CAD: He has extensive coronary atherosclerosis with moderate stenoses in the LAD and first diagonal artery and mild stenoses in the left circumflex and right coronary artery, but does not have angina pectoris and by CT FFR none of these lesions are hemodynamically important.  The focuses on risk factor modification.  He is not on aspirin due to full anticoagulation with Eliquis. ICD: normal device function.  Has a follow-up visit with Dr. Quentin Ore on June 21 when he will "bring down" the outputs on the atrial lead.  By that time his corvue will be useful. Obesity: He has prediabetes but not full-blown diabetes so does not qualify for GLP-1 agonists at this time, but should reduce his intake of alcohol, carbohydrates and overall calories to lose weight. HLP: LDL target less than 70, well within this range on his most recent labs.  Has mildly elevated triglycerides which should improve with reduced alcohol consumption and weight loss.  [place] triglyceride lowering pharmacological treatment is not necessary. OSA: reports 100% compliance with CPAP. ED: OK to take PDE5 inhibitors but never combine with nitrates. Prefer shorter acting sildenafil.   Medication Adjustments/Labs and Tests Ordered: Current medicines are reviewed at length with the patient  today.  Concerns regarding medicines are outlined above.  No orders of the defined types were placed in this encounter.   Meds ordered this encounter  Medications   carvedilol (COREG) 25 MG tablet    Sig: Take 0.5 tablets (12.5 mg total) by mouth every morning AND 1 tablet (25 mg total) every evening.    Dispense:  135 tablet    Refill:  3   sildenafil (VIAGRA) 100 MG tablet    Sig: Take 1 tablet (100 mg total) by mouth at bedtime as needed for erectile dysfunction.    Dispense:  30 tablet    Refill:  0     Patient Instructions  Medication Instructions:  Carvedilol 12.5mg  every morning and 25mg  every evening *If you need a refill on your cardiac medications before your next appointment, please call your pharmacy*   Follow-Up: At [facility], you and your health needs are our priority.  As part of our continuing mission to provide you with exceptional heart care, we have created designated Provider Care Teams.  These Care Teams include your primary Cardiologist (physician) and Advanced Practice Providers (APPs -  Physician Assistants and Nurse Practitioners) who all work together to provide you with the care you need, when you need it.  We recommend signing up for the patient portal called "MyChart".  Sign up information is provided on this After Visit Summary.  MyChart is used to connect with patients for Virtual Visits (Telemedicine).  Patients are able to view lab/test results, encounter notes, upcoming appointments, etc.  Non-urgent messages can be sent to your provider as well.   To learn more about what you can do with MyChart, go to [email or web address].    Your next appointment:   5 month(s)  Provider:   Dr Sallyanne Kuster   Signed, Sanda Klein, MD  12/10/2022 11:13 AM    [place]

## 2022-12-12 ENCOUNTER — Ambulatory Visit: Payer: Managed Care, Other (non HMO)

## 2022-12-24 ENCOUNTER — Telehealth (HOSPITAL_COMMUNITY): Payer: Self-pay

## 2022-12-24 IMAGING — DX DG PORTABLE PELVIS
1 series · 1 of 1 positions shown · non-contrast
Comparison: Fluoroscopic images of same day.

CLINICAL DATA: Status post right hip replacement.

EXAM:
PORTABLE PELVIS 1-2 VIEWS

[pelvis ap]
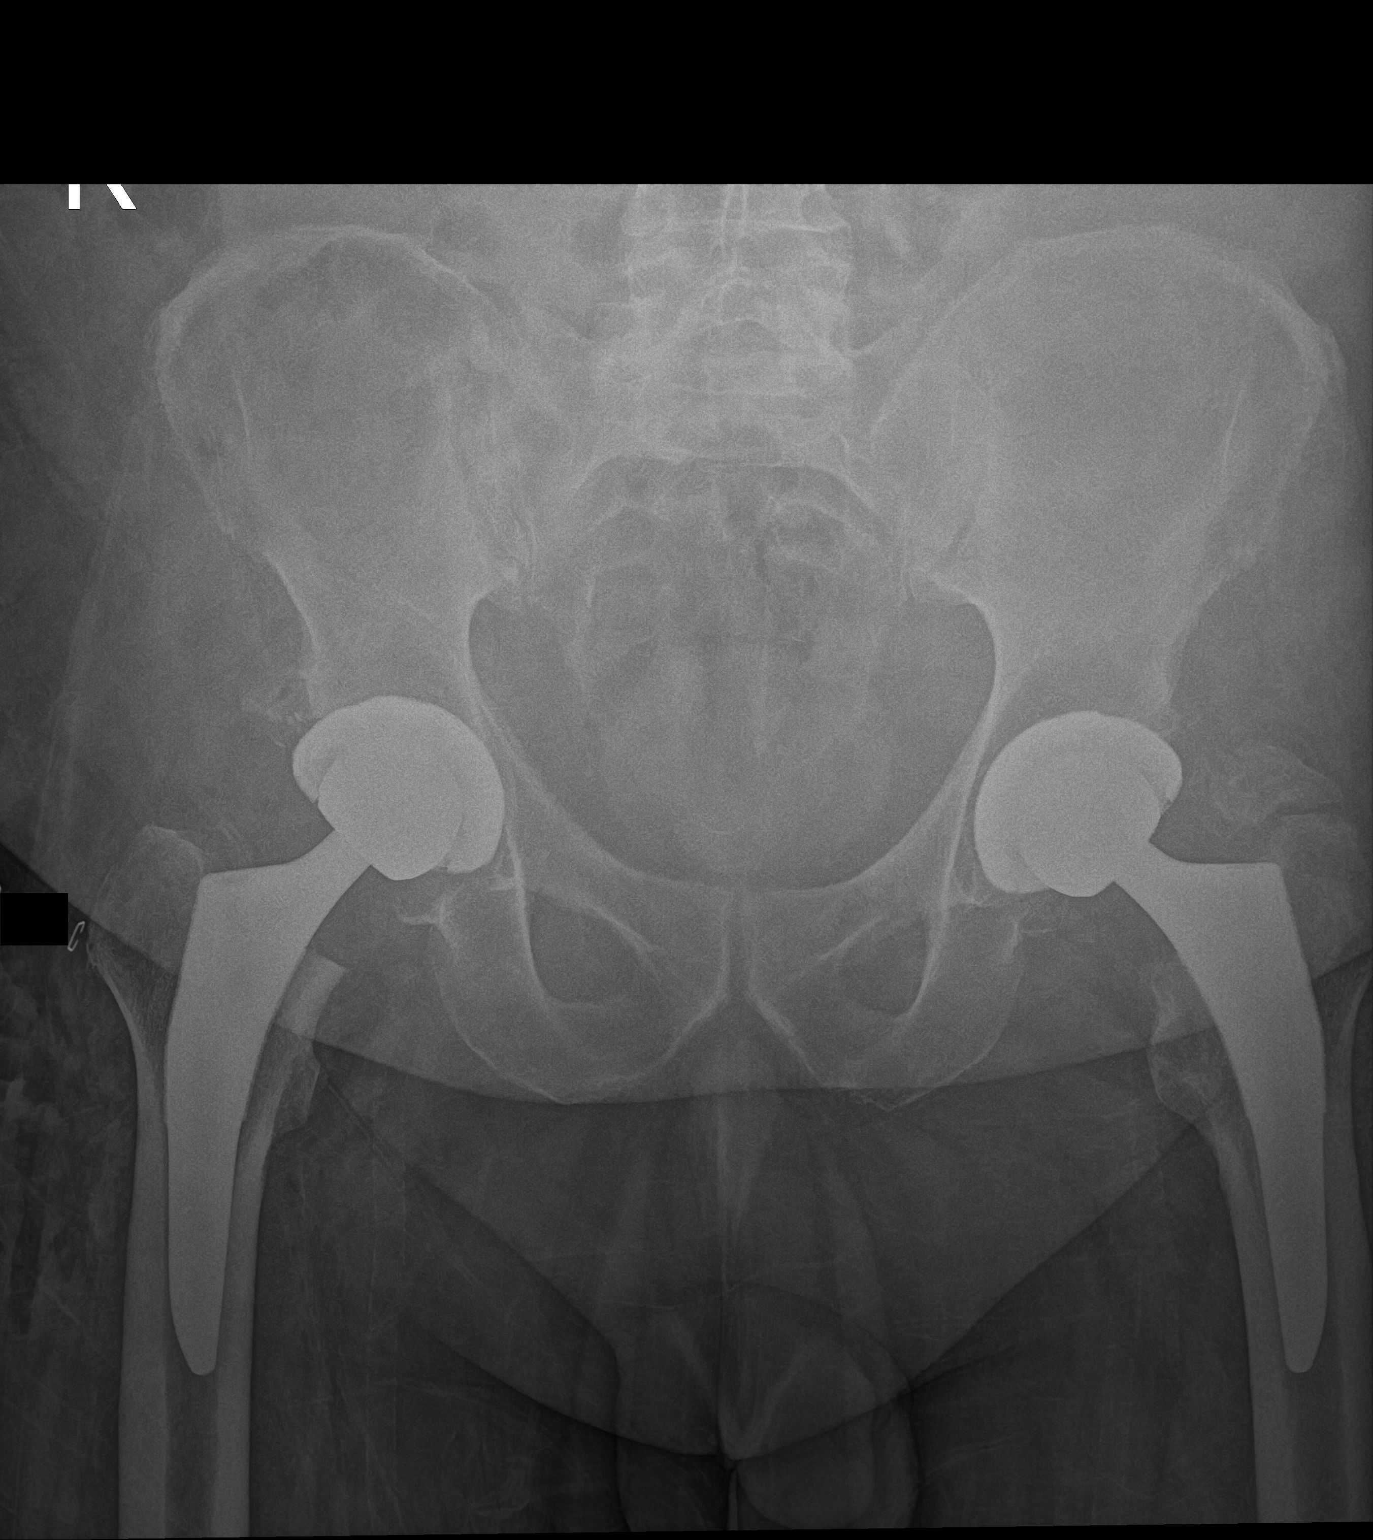

[1 of 1 positions shown; findings below may reference images not displayed]

FINDINGS: The right femoral and acetabular components are well situated.
Previous left hip arthroplasty is noted. No acute fracture or
dislocation is noted.
IMPRESSION: Status post interval right total hip arthroplasty.

## 2022-12-24 NOTE — Telephone Encounter (Signed)
Disability paper work faxed to [employer] on 12/24/22. My chart message sent to patient to inform him

## 2022-12-29 ENCOUNTER — Other Ambulatory Visit (HOSPITAL_COMMUNITY): Payer: Self-pay | Admitting: Cardiology

## 2023-01-17 ENCOUNTER — Ambulatory Visit: Payer: Commercial Managed Care - HMO | Admitting: Internal Medicine

## 2023-01-18 ENCOUNTER — Emergency Department (HOSPITAL_BASED_OUTPATIENT_CLINIC_OR_DEPARTMENT_OTHER)
Admission: EM | Admit: 2023-01-18 | Discharge: 2023-01-19 | Disposition: A | Discharge status: Home / Self Care | Payer: Commercial Managed Care - HMO | Attending: Emergency Medicine | Admitting: Emergency Medicine

## 2023-01-18 ENCOUNTER — Encounter (HOSPITAL_BASED_OUTPATIENT_CLINIC_OR_DEPARTMENT_OTHER): Payer: Self-pay | Admitting: Emergency Medicine

## 2023-01-18 DIAGNOSIS — S298XXA Other specified injuries of thorax, initial encounter: Secondary | ICD-10-CM | POA: Diagnosis not present

## 2023-01-18 DIAGNOSIS — S62336A Displaced fracture of neck of fifth metacarpal bone, right hand, initial encounter for closed fracture: Secondary | ICD-10-CM

## 2023-01-18 DIAGNOSIS — I5042 Chronic combined systolic (congestive) and diastolic (congestive) heart failure: Secondary | ICD-10-CM | POA: Insufficient documentation

## 2023-01-18 DIAGNOSIS — I110 Hypertensive heart disease with heart failure: Secondary | ICD-10-CM | POA: Diagnosis not present

## 2023-01-18 DIAGNOSIS — W010XXA Fall on same level from slipping, tripping and stumbling without subsequent striking against object, initial encounter: Secondary | ICD-10-CM

## 2023-01-18 DIAGNOSIS — T07XXXA Unspecified multiple injuries, initial encounter: Secondary | ICD-10-CM | POA: Diagnosis not present

## 2023-01-18 DIAGNOSIS — Z23 Encounter for immunization: Secondary | ICD-10-CM | POA: Diagnosis not present

## 2023-01-18 DIAGNOSIS — Z7901 Long term (current) use of anticoagulants: Secondary | ICD-10-CM | POA: Diagnosis not present

## 2023-01-18 DIAGNOSIS — H05231 Hemorrhage of right orbit: Secondary | ICD-10-CM | POA: Diagnosis not present

## 2023-01-18 DIAGNOSIS — S6991XA Unspecified injury of right wrist, hand and finger(s), initial encounter: Secondary | ICD-10-CM | POA: Diagnosis present

## 2023-01-18 DIAGNOSIS — S0033XA Contusion of nose, initial encounter: Secondary | ICD-10-CM | POA: Diagnosis not present

## 2023-01-18 NOTE — ED Triage Notes (Signed)
Tripped over curb last night. Scrapped knuckle on both hands, c/o right side rib pain. Hit head. Denies loc. On eliquis.

## 2023-01-18 NOTE — ED Provider Notes (Signed)
[facility] Provider Note: John Dell, MD, FACEP  CSN: [id number] MRN: [id number] ARRIVAL: 01/18/23 at 2323 ROOM: DB009/DB009   CHIEF COMPLAINT  Fall   HISTORY OF PRESENT ILLNESS  01/18/23 11:52 PM John [name] is a 62 y.o. male who tripped over a curb last night.  He scraped the knuckles on both hands (particularly his right hand, which is painful whereas the left hand is not), has right-sided anterior rib pain, and he hit his head.  He has a right periorbital ecchymosis as well as a contusion to his nose.  He did not lose consciousness but he is on Eliquis.  He rates the pain in his ribs as a 10 out of 10, worse with breathing or palpation.    Past Medical History:  Diagnosis Date   AICD (automatic cardioverter/defibrillator) present    St. [employer]   Anxiety    Ativan   Chronic combined systolic and diastolic CHF, NYHA class 3 (HCC) 10/2016   Nonischemic cardiomyopathy. EF 20-25%.   Chronic left hip pain    Depression    history of   Dysrhythmia    A-FIB   GI bleed 03/2016   Headache    Hypertensive heart disease with combined systolic and diastolic congestive heart failure (HCC) 10/2016   Nonischemic cardiomyopathy (HCC) 10/2016   Echo with EF 20-25%. Cardiac catheterization with no CAD. LVEDP was 41 mmHg, PCWP 36 mmHg   Osteoarthritis    right shoulder   Right hand fracture    Seasonal allergies    Sleep apnea    wears a CPAP   Wears glasses     Past Surgical History:  Procedure Laterality Date   CARDIAC CATHETERIZATION     CARDIOVERSION N/A 09/01/2019   Procedure: CARDIOVERSION;  Surgeon: Laurey Morale, MD;  Location: [facility] ENDOSCOPY;  Service: Cardiovascular;  Laterality: N/A;   CARDIOVERSION N/A 10/07/2022   Procedure: CARDIOVERSION;  Surgeon: Thomasene Ripple, DO;  Location: [facility] ENDOSCOPY;  Service: Cardiovascular;  Laterality: N/A;   ESOPHAGOGASTRODUODENOSCOPY (EGD) WITH PROPOFOL N/A 03/20/2016   Procedure: ESOPHAGOGASTRODUODENOSCOPY (EGD) WITH  PROPOFOL;  Surgeon: Charlott Rakes, MD;  Location: [facility] ENDOSCOPY;  Service: Endoscopy;  Laterality: N/A;   ICD IMPLANT N/A 05/06/2018   Procedure: ICD IMPLANT;  Surgeon: Thurmon Fair, MD;  Location: [facility] INVASIVE CV LAB;  Service: Cardiovascular;  Laterality: N/A;   IRRIGATION AND DEBRIDEMENT SHOULDER Left 11/11/2019   Procedure: LEFT SHOULDER IRRIGATION AND DEBRIDEMENT WITH POLY EXCHANGE;  Surgeon: Jones Broom, MD;  Location: [facility] ORS;  Service: Orthopedics;  Laterality: Left;   KYPHOPLASTY N/A 02/22/2021   Procedure: KYPHOPLASTY T12;  Surgeon: Venita Lick, MD;  Location: [facility] OR;  Service: Orthopedics;  Laterality: N/A;  90 mins   LEAD REVISION/REPAIR N/A 11/11/2022   Procedure: LEAD atrial lead insertion;  Surgeon: Lanier Prude, MD;  Location: [facility] INVASIVE CV LAB;  Service: Cardiovascular;  Laterality: N/A;   ORIF WRIST FRACTURE Left 02/04/2022   Procedure: OPEN REDUCTION INTERNAL FIXATION (ORIF) WRIST FRACTURE;  Surgeon: Bradly Bienenstock, MD;  Location: [facility] OR;  Service: Orthopedics;  Laterality: Left;  with IV sedation   PPM GENERATOR CHANGEOUT N/A 11/11/2022   Procedure: PPM GENERATOR CHANGEOUT;  Surgeon: Lanier Prude, MD;  Location: [facility] INVASIVE CV LAB;  Service: Cardiovascular;  Laterality: N/A;   RIGHT/LEFT HEART CATH AND CORONARY ANGIOGRAPHY N/A 10/21/2016   Procedure: Right/Left Heart Cath and Coronary Angiography;  Surgeon: Runell Gess, MD;  Location: [facility] INVASIVE CV LAB;  Service: Cardiovascular: Angiographically normal coronary arteries. PCWP  33-36 mmHg, LVEDP 41 mmHg. PA pressure 60/35, mean 46 mmHg.  Cardiac output/cardiac index-3.93 /1.76 Hiram Comber), 3.49/1.57 (thermodilution)   TEE WITHOUT CARDIOVERSION N/A 09/01/2019   Procedure: TRANSESOPHAGEAL ECHOCARDIOGRAM (TEE);  Surgeon: Laurey Morale, MD;  Location: [facility] ENDOSCOPY;  Service: Cardiovascular;  Laterality: N/A;   TOTAL HIP ARTHROPLASTY Left 03/27/2015   Procedure: LEFT TOTAL HIP ARTHROPLASTY ANTERIOR APPROACH;  Surgeon:  Samson Frederic, MD;  Location: [facility] OR;  Service: Orthopedics;  Laterality: Left;   TOTAL HIP ARTHROPLASTY Right 05/03/2021   Procedure: TOTAL HIP ARTHROPLASTY ANTERIOR APPROACH;  Surgeon: Samson Frederic, MD;  Location: [facility] ORS;  Service: Orthopedics;  Laterality: Right;   TOTAL SHOULDER ARTHROPLASTY Right 11/24/2015   Procedure: RIGHT TOTAL SHOULDER ARTHROPLASTY;  Surgeon: Beverely Low, MD;  Location: [facility];  Service: Orthopedics;  Laterality: Right;   TOTAL SHOULDER ARTHROPLASTY Left 10/19/2019   Procedure: REVERSE TOTAL SHOULDER ARTHROPLASTY;  Surgeon: Jones Broom, MD;  Location: [facility] ORS;  Service: Orthopedics;  Laterality: Left;   TRANSTHORACIC ECHOCARDIOGRAM  10/2016   EF 20-25%. Diffuse hypokinesis but akinesis of the entire inferoseptal wall and apical wall. Moderate biatrial enlargement. PA pressure estimated 64 mmHg.   WISDOM TOOTH EXTRACTION      Family History  Problem Relation Age of Onset   Dementia Mother        died at 12   Lung cancer Father        smoker   Congenital heart disease Sister        lived to 70    Healthy Brother    Healthy Sister    Prostate cancer Brother        possible cancer   Healthy Brother    Healthy Brother     Social History   Tobacco Use   Smoking status: Never   Smokeless tobacco: Never  Vaping Use   Vaping Use: Never used  Substance Use Topics   Alcohol use: Yes    Alcohol/week: 12.0 standard drinks of alcohol    Types: 12 Cans of beer per week    Comment: per week   Drug use: No    Prior to Admission medications   Medication Sig Start Date End Date Taking? Authorizing Provider  oxyCODONE-acetaminophen (PERCOCET) 5-325 MG tablet Take 1 tablet by mouth every 4 (four) hours as needed for severe pain. 01/19/23  Yes Xiong Haidar, MD  apixaban (ELIQUIS) 5 MG TABS tablet Take 1 tablet (5 mg total) by mouth 2 (two) times daily. Restart 3/3 with the am dose. 11/17/22   Graciella Freer, PA-C  carvedilol (COREG) 25 MG tablet Take 0.5  tablets (12.5 mg total) by mouth every morning AND 1 tablet (25 mg total) every evening. 12/09/22   Croitoru, Mihai, MD  diphenhydramine-acetaminophen (TYLENOL PM) 25-500 MG TABS tablet Take 2 tablets by mouth at bedtime.    [provider]  ENTRESTO 97-103 MG TAKE 1 TABLET BY MOUTH TWICE A DAY 09/11/22   Laurey Morale, MD  FARXIGA 10 MG TABS tablet TAKE 1 TABLET BY MOUTH EVERY DAY BEFORE BREAKFAST 03/08/22   Laurey Morale, MD  Multiple Vitamin (MULTIVITAMIN WITH MINERALS) TABS tablet Take 1 tablet by mouth in the morning.    [provider]  rosuvastatin (CRESTOR) 20 MG tablet TAKE 1 TABLET BY MOUTH EVERY DAY 10/14/22   Laurey Morale, MD  sertraline (ZOLOFT) 100 MG tablet Take 100 mg by mouth daily. 07/27/22   [provider]  sildenafil (VIAGRA) 100 MG tablet Take 1 tablet (100  mg total) by mouth at bedtime as needed for erectile dysfunction. 12/09/22   Croitoru, Mihai, MD  spironolactone (ALDACTONE) 50 MG tablet Take 0.5 tablets (25 mg total) by mouth at bedtime. 10/23/22   Laurey Morale, MD  torsemide (DEMADEX) 20 MG tablet Take 2 tablets (40 mg total) by mouth daily. 07/24/22   Croitoru, Mihai, MD  traZODone (DESYREL) 100 MG tablet Take 100 mg by mouth daily. 11/25/22   [provider]  traZODone (DESYREL) 50 MG tablet TAKE 1 TABLET BY MOUTH EVERY DAY AT BEDTIME AS NEEDED 11/05/22   Shelva Majestic, MD    Allergies Chlorhexidine, Cymbalta [duloxetine hcl], and Hydrocodone   REVIEW OF SYSTEMS  Negative except as noted here or in the History of Present Illness.   PHYSICAL EXAMINATION  Initial Vital Signs Blood pressure 113/68, pulse 88, temperature 99.5 F (37.5 C), temperature source Oral, resp. rate 18, SpO2 97 %.  Examination General: Well-developed, well-nourished male in no acute distress; appearance consistent with age of record HENT: normocephalic; right periorbital ecchymosis and nasal contusion:    Eyes: pupils equal, round and  reactive to light; extraocular muscles intact Neck: supple; nontender Heart: regular rate and rhythm Lungs: clear to auscultation bilaterally Chest: Right-sided anterior tenderness Abdomen: soft; nondistended; nontender; bowel sounds present Extremities: No deformity; full range of motion; abrasions and tenderness dorsal right hand and wrist:    Neurologic: Awake, alert and oriented; motor function intact in all extremities and symmetric; no facial droop Skin: Warm and dry Psychiatric: Normal mood and affect   RESULTS  Summary of this visit's results, reviewed and interpreted by myself:   EKG Interpretation  Date/Time:    Ventricular Rate:    PR Interval:    QRS Duration:   QT Interval:    QTC Calculation:   R Axis:     Text Interpretation:         Laboratory Studies: No results found. However, due to the size of the patient record, not all encounters were searched. Please check Results Review for a complete set of results. Imaging Studies: CT Chest Wo Contrast  Result Date: 01/19/2023 CLINICAL DATA:  Tripped over curb.  Right-sided rib pain EXAM: CT CHEST WITHOUT CONTRAST TECHNIQUE: Multidetector CT imaging of the chest was performed following the standard protocol without IV contrast. RADIATION DOSE REDUCTION: This exam was performed according to the departmental dose-optimization program which includes automated exposure control, adjustment of the mA and/or kV according to patient size and/or use of iterative reconstruction technique. COMPARISON:  Chest radiograph 11/11/2022 and CT chest abdomen and pelvis 06/26/2022 FINDINGS: Cardiovascular: Normal heart size. No pericardial effusion. Left chest wall ICD. Coronary artery and aortic atherosclerotic calcification. Mediastinum/Nodes: Unremarkable esophagus.  No thoracic adenopathy. Lungs/Pleura: No focal consolidation, pleural effusion, or pneumothorax. Mild scarring in the lingula. Central airways are patent. Upper Abdomen: No  acute abnormality. Musculoskeletal: Remote right rib fractures. No acute rib fracture. Postoperative changes both shoulders. Unchanged mild superior endplate compression of T12 status post vertebroplasty. IMPRESSION: 1. No evidence of acute traumatic injury in the chest. Aortic Atherosclerosis (ICD10-I70.0). Electronically Signed   By: Minerva Fester M.D.   On: 01/19/2023 00:41   DG Hand Complete Right  Result Date: 01/19/2023 CLINICAL DATA:  Tripped over curb last night. Scraped knuckle on both hands. EXAM: RIGHT HAND - COMPLETE 3+ VIEW COMPARISON:  None Available. FINDINGS: Acute minimally displaced transverse fracture of the head of the fifth metacarpal. Mild soft tissue swelling about the knuckles. IMPRESSION: Acute minimally displaced fracture  of the head of the fifth metacarpal. Electronically Signed   By: Minerva Fester M.D.   On: 01/19/2023 00:35   CT Head Wo Contrast  Result Date: 01/19/2023 CLINICAL DATA:  Fall, on Eliquis EXAM: CT HEAD WITHOUT CONTRAST TECHNIQUE: Contiguous axial images were obtained from the base of the skull through the vertex without intravenous contrast. RADIATION DOSE REDUCTION: This exam was performed according to the departmental dose-optimization program which includes automated exposure control, adjustment of the mA and/or kV according to patient size and/or use of iterative reconstruction technique. COMPARISON:  06/26/2022 FINDINGS: Brain: No evidence of acute infarction, hemorrhage, mass, mass effect, or midline shift. No hydrocephalus or extra-axial fluid collection. Mineralization in the bilateral basal ganglia. Vascular: No hyperdense vessel. Skull: Negative for fracture or focal lesion. Sinuses/Orbits: Minimal mucosal thickening in the maxillary sinuses and ethmoid air cells. No acute finding in the orbits. Other: The mastoid air cells are well aerated. IMPRESSION: No acute intracranial process. Electronically Signed   By: Wiliam Ke M.D.   On: 01/19/2023 00:34     ED COURSE and MDM  Nursing notes, initial and subsequent vitals signs, including pulse oximetry, reviewed and interpreted by myself.  Vitals:   01/18/23 2341 01/19/23 0003  BP: 113/68 120/71  Pulse: 88 76  Resp: 18 20  Temp: 99.5 F (37.5 C)   TempSrc: Oral   SpO2: 97% 96%   Medications  Tdap (BOOSTRIX) injection 0.5 mL (0.5 mLs Intramuscular Given 01/19/23 0011)  oxyCODONE-acetaminophen (PERCOCET/ROXICET) 5-325 MG per tablet 2 tablet (2 tablets Oral Given 01/19/23 0040)   No radiographic evidence of rib fracture or other intrathoracic trauma.  No radiographic evidence of significant intracranial or cranial injury.  Radiographs of the right hand do show the fracture of the right fifth metacarpal.  We will place him in an ulnar gutter splint and refer to the hand surgeon on-call.  Tdap given as patient's tetanus is not up-to-date.   PROCEDURES  Procedures   ED DIAGNOSES     ICD-10-CM   1. Fall from slip, trip, or stumble, initial encounter  W01.0XXA     2. Periorbital hematoma of right eye  H05.231     3. Contusion of nose, initial encounter  S00.33XA     4. Abrasion, multiple sites  T07.XXXA     5. Blunt trauma to chest, initial encounter  S29.8XXA     6. Displaced fracture of neck of fifth metacarpal bone, right hand, initial encounter for closed fracture  S62.336A          Teana Lindahl, Jonny Ruiz, MD 01/19/23 1610

## 2023-01-19 ENCOUNTER — Emergency Department (HOSPITAL_BASED_OUTPATIENT_CLINIC_OR_DEPARTMENT_OTHER): Payer: Commercial Managed Care - HMO

## 2023-01-19 MED ORDER — OXYCODONE-ACETAMINOPHEN 5-325 MG PO TABS
2.0000 | ORAL_TABLET | Freq: Once | ORAL | Status: AC
  Administered 2023-01-19: 2 via ORAL
  Filled 2023-01-19: qty 2

## 2023-01-19 MED ORDER — OXYCODONE-ACETAMINOPHEN 5-325 MG PO TABS: 1.0000 | ORAL_TABLET | ORAL | 0 refills | Status: DC | PRN

## 2023-01-19 MED ORDER — TETANUS-DIPHTH-ACELL PERTUSSIS 5-2.5-18.5 LF-MCG/0.5 IM SUSY
0.5000 mL | PREFILLED_SYRINGE | Freq: Once | INTRAMUSCULAR | Status: AC
  Administered 2023-01-19: 0.5 mL via INTRAMUSCULAR
  Filled 2023-01-19: qty 0.5

## 2023-01-19 NOTE — ED Notes (Signed)
Patient to CT via stretcher.

## 2023-01-24 ENCOUNTER — Ambulatory Visit: Payer: Commercial Managed Care - HMO | Admitting: Internal Medicine

## 2023-01-25 ENCOUNTER — Other Ambulatory Visit: Payer: Self-pay | Admitting: Cardiovascular Disease

## 2023-01-28 ENCOUNTER — Ambulatory Visit: Payer: Managed Care, Other (non HMO)

## 2023-02-04 ENCOUNTER — Other Ambulatory Visit (HOSPITAL_COMMUNITY): Payer: Self-pay | Admitting: Cardiology

## 2023-02-06 ENCOUNTER — Other Ambulatory Visit: Payer: Self-pay | Admitting: Family Medicine

## 2023-02-07 ENCOUNTER — Telehealth: Payer: Self-pay

## 2023-02-07 ENCOUNTER — Encounter: Payer: Self-pay | Admitting: Cardiology

## 2023-02-07 NOTE — Telephone Encounter (Signed)
See MyChart messages.

## 2023-02-07 NOTE — Telephone Encounter (Signed)
I asked the patient to send a picture to his my chart for the nurse to review. He will have his wife send it by 4 pm. I told him once we receive the picture the nurse will review it and determine if he needs to come into the office or not.

## 2023-02-12 ENCOUNTER — Ambulatory Visit (INDEPENDENT_AMBULATORY_CARE_PROVIDER_SITE_OTHER): Payer: Commercial Managed Care - HMO

## 2023-02-12 DIAGNOSIS — I428 Other cardiomyopathies: Secondary | ICD-10-CM

## 2023-02-13 LAB — CUP PACEART REMOTE DEVICE CHECK
Battery Remaining Longevity: 73 mo
Battery Remaining Percentage: 93 %
Battery Voltage: 3.02 V
Brady Statistic AP VP Percent: 4.5 %
Brady Statistic AP VS Percent: 84 %
Brady Statistic AS VP Percent: 1 %
Brady Statistic AS VS Percent: 10 %
Brady Statistic RA Percent Paced: 87 %
Brady Statistic RV Percent Paced: 4.5 %
Date Time Interrogation Session: 20240529030233
HighPow Impedance: 86 Ohm
Implantable Lead Connection Status: 753985
Implantable Lead Connection Status: 753985
Implantable Lead Implant Date: 20190821
Implantable Lead Implant Date: 20240226
Implantable Lead Location: 753859
Implantable Lead Location: 753860
Implantable Lead Model: 7122
Implantable Pulse Generator Implant Date: 20240226
Lead Channel Impedance Value: 440 Ohm
Lead Channel Impedance Value: 490 Ohm
Lead Channel Pacing Threshold Amplitude: 0.75 V
Lead Channel Pacing Threshold Amplitude: 0.75 V
Lead Channel Pacing Threshold Pulse Width: 0.5 ms
Lead Channel Pacing Threshold Pulse Width: 0.5 ms
Lead Channel Sensing Intrinsic Amplitude: 12 mV
Lead Channel Sensing Intrinsic Amplitude: 3.9 mV
Lead Channel Setting Pacing Amplitude: 2.5 V
Lead Channel Setting Pacing Amplitude: 3.5 V
Lead Channel Setting Pacing Pulse Width: 0.5 ms
Lead Channel Setting Sensing Sensitivity: 0.5 mV
Pulse Gen Serial Number: 211012451
Zone Setting Status: 755011

## 2023-02-18 ENCOUNTER — Encounter: Payer: Self-pay | Admitting: Internal Medicine

## 2023-02-18 ENCOUNTER — Other Ambulatory Visit: Payer: Self-pay

## 2023-02-18 ENCOUNTER — Ambulatory Visit (INDEPENDENT_AMBULATORY_CARE_PROVIDER_SITE_OTHER): Payer: Commercial Managed Care - HMO | Admitting: Internal Medicine

## 2023-02-18 VITALS — BP 114/73 | HR 73 | Temp 98.2°F | Ht 71.0 in

## 2023-02-18 DIAGNOSIS — I252 Old myocardial infarction: Secondary | ICD-10-CM | POA: Diagnosis not present

## 2023-02-18 DIAGNOSIS — M109 Gout, unspecified: Secondary | ICD-10-CM | POA: Diagnosis not present

## 2023-02-18 DIAGNOSIS — R110 Nausea: Secondary | ICD-10-CM

## 2023-02-18 DIAGNOSIS — Z6837 Body mass index (BMI) 37.0-37.9, adult: Secondary | ICD-10-CM

## 2023-02-18 DIAGNOSIS — I428 Other cardiomyopathies: Secondary | ICD-10-CM

## 2023-02-18 DIAGNOSIS — K219 Gastro-esophageal reflux disease without esophagitis: Secondary | ICD-10-CM

## 2023-02-18 MED ORDER — SEMAGLUTIDE-WEIGHT MANAGEMENT 2.4 MG/0.75ML ~~LOC~~ SOAJ
2.4000 mg | SUBCUTANEOUS | 0 refills | Status: DC
Start: 2023-06-14 — End: 2023-02-20

## 2023-02-18 MED ORDER — PEPCID COMPLETE 10-800-165 MG PO CHEW: 1.0000 | CHEWABLE_TABLET | Freq: Two times a day (BID) | ORAL | 11 refills | Status: DC | PRN

## 2023-02-18 MED ORDER — SEMAGLUTIDE-WEIGHT MANAGEMENT 0.5 MG/0.5ML ~~LOC~~ SOAJ: 0.5000 mg | SUBCUTANEOUS | 0 refills | Status: DC

## 2023-02-18 MED ORDER — SEMAGLUTIDE-WEIGHT MANAGEMENT 0.25 MG/0.5ML ~~LOC~~ SOAJ: 0.2500 mg | SUBCUTANEOUS | 0 refills | Status: DC

## 2023-02-18 MED ORDER — SEMAGLUTIDE-WEIGHT MANAGEMENT 1 MG/0.5ML ~~LOC~~ SOAJ
1.0000 mg | SUBCUTANEOUS | 0 refills | Status: DC
Start: 2023-04-17 — End: 2023-02-20

## 2023-02-18 MED ORDER — KETOROLAC TROMETHAMINE 10 MG PO TABS
10.0000 mg | ORAL_TABLET | Freq: Four times a day (QID) | ORAL | 0 refills | Status: DC | PRN
Start: 2023-02-18 — End: 2023-03-07

## 2023-02-18 MED ORDER — ONDANSETRON HCL 4 MG PO TABS: 4.0000 mg | ORAL_TABLET | Freq: Three times a day (TID) | ORAL | 0 refills | Status: DC | PRN

## 2023-02-18 MED ORDER — METHYLPREDNISOLONE ACETATE 80 MG/ML IJ SUSP
80.0000 mg | Freq: Once | INTRAMUSCULAR | Status: AC
Start: 2023-02-18 — End: 2023-02-18
  Administered 2023-02-18: 80 mg via INTRAMUSCULAR

## 2023-02-18 MED ORDER — PREDNISONE 20 MG PO TABS: ORAL_TABLET | ORAL | 0 refills | Status: DC

## 2023-02-18 MED ORDER — SEMAGLUTIDE-WEIGHT MANAGEMENT 1.7 MG/0.75ML ~~LOC~~ SOAJ
1.7000 mg | SUBCUTANEOUS | 0 refills | Status: DC
Start: 2023-05-16 — End: 2023-02-20

## 2023-02-18 NOTE — Progress Notes (Signed)
[facility]: [phone]   Routine Medical Office Visit  Patient:  John [name]      Age: 62 y.o.       Sex:  male  Date:   02/18/2023 PCP:    Shelva Majestic, MD   Today's Healthcare Provider: Lula Olszewski, MD   Assessment and Plan:   Based on Abridge AI conversational text extraction: Assessment and Plan    Acute Gout Flare: First episode of gout, presenting with pain at the base of the right big toe spreading to the right inside ankle. No signs of infection. Discussed differential diagnoses and the need for fluid aspiration for definitive diagnosis, but patient declined. -Administer Solu-Medrol injection for immediate relief. -Prescribe Toradol for pain and inflammation, as Colchicine is contraindicated due to interaction with Carvedilol. -Advise on dietary changes to prevent future gout attacks. -Plan to check uric acid levels once the acute flare is under control. -Consider future prescription of Allopurinol for recurrence prevention.    --Add uric acid blood check to future lab work, but not this week as gout flare is acute    - encouraged patient to to follow up next week after prednisone course for re-evaluation, not wait til upcoming wellness visit with Dr. Durene Cal next month.  Cardiovascular Risk Management: Patient has a history of cardiac issues, including a defibrillator and pacemaker implant. Discussed the benefits of Ozempic/Wegovy for weight loss, gout prevention, and cardiovascular risk reduction. His cardiologist sent Ozempic but he didn't have diabetes mellitus.  Now Reginal Lutes has the indication as well he request I prescribe -Prescribe Wegovy, starting at a low dose and gradually increasing to a higher dose over 28 days.  Gastrointestinal Protection: Due to the risk of GI bleeding from Toradol and the patient's use of a blood thinner, protective measures are necessary. -Prescribe Pepcid Complete to be taken with Toradol to protect the  stomach.      Based on updated problems and orders placed with problem based charting:  Assessment and Plan John [name] was seen today for gout in right toe/top of foot.  Gouty arthritis of right foot -     predniSONE; Take 2 pills for 3 days, 1 pill for 4 days  Dispense: 10 tablet; Refill: 0 -     Ambulatory referral to Podiatry -     Ketorolac Tromethamine; Take 1 tablet (10 mg total) by mouth every 6 (six) hours as needed. Do not take with ibuprofen/aleve with this. Take with Pepcid complete.  Dispense: 20 tablet; Refill: 0  Podagra -     predniSONE; Take 2 pills for 3 days, 1 pill for 4 days  Dispense: 10 tablet; Refill: 0 -     Ambulatory referral to Podiatry  Non-ischemic cardiomyopathy [facility]) Overview: Feb 2018:  2D Echo  - Left ventricle: The cavity size was mildly dilated. There was mild concentric hypertrophy. Systolic function was severely reduced. The estimated ejection fraction was in the range of 20%   to 25%. Severe diffuse hypokinesis with regional variations. There is akinesis of the entireapical myocardium. There is akinesis of the entireinferoseptal myocardium. - Mitral valve: Calcified annulus. Mild focal calcification of the anterior leaflet (medial segment(s)) and posterior leaflet (medial scallop(s)). There was mild regurgitation. - Left atrium: The atrium was moderately dilated. - Right ventricle: The cavity size was mildly dilated. Wall thickness was normal. - Right atrium: The atrium was moderately dilated. - Pulmonary arteries: PA peak pressure: 64 mm Hg (S) --> The right ventricular systolic pressure was increased  consistent with moderate pulmonary hypertension.  R&LHC: Angiographically normal coronary arteries.  The left ventricle is dilated. There is severe left ventricular systolic dysfunction. LV end diastolic pressure is severely elevated. The left ventricular ejection fraction is less than 25% by visual estimate. There are LV function abnormalities. RA  pressure-21/19, mean 17 RV pressure-61/12 PA pressure 60/35, mean 46 Pulmonary capillary wedge pressure 34/36, mean 33 LVEDP-41 Cardiac output/cardiac index-3.93 /1.76 (Fick), 3.49/1.57 (thermodilution)   History of non-ST elevation myocardial infarction (NSTEMI) -     Semaglutide-Weight Management; Inject 0.25 mg into the skin once a week for 28 days.  Dispense: 2 mL; Refill: 0 -     Semaglutide-Weight Management; Inject 0.5 mg into the skin once a week for 28 days.  Dispense: 2 mL; Refill: 0 -     Semaglutide-Weight Management; Inject 1 mg into the skin once a week for 28 days.  Dispense: 2 mL; Refill: 0 -     Semaglutide-Weight Management; Inject 1.7 mg into the skin once a week for 28 days.  Dispense: 3 mL; Refill: 0 -     Semaglutide-Weight Management; Inject 2.4 mg into the skin once a week for 28 days.  Dispense: 3 mL; Refill: 0  Morbid obesity (HCC) -     Semaglutide-Weight Management; Inject 0.25 mg into the skin once a week for 28 days.  Dispense: 2 mL; Refill: 0 -     Semaglutide-Weight Management; Inject 0.5 mg into the skin once a week for 28 days.  Dispense: 2 mL; Refill: 0 -     Semaglutide-Weight Management; Inject 1 mg into the skin once a week for 28 days.  Dispense: 2 mL; Refill: 0 -     Semaglutide-Weight Management; Inject 1.7 mg into the skin once a week for 28 days.  Dispense: 3 mL; Refill: 0 -     Semaglutide-Weight Management; Inject 2.4 mg into the skin once a week for 28 days.  Dispense: 3 mL; Refill: 0  Nausea -     Ondansetron HCl; Take 1 tablet (4 mg total) by mouth every 8 (eight) hours as needed for nausea or vomiting.  Dispense: 20 tablet; Refill: 0  Gastroesophageal reflux disease without esophagitis -     Pepcid Complete; Chew 1 tablet by mouth 2 (two) times daily as needed.  Dispense: 100 tablet; Refill: 11    Treatment plan discussed and reviewed in detail. Explained medication safety and potential side effects.  Answered all patient questions and  confirmed understanding and comfort with the plan. Encouraged patient to contact our office if they have any questions or concerns. Agreed on patient returning to office if symptoms worsen, persist, or new symptoms develop. Discussed precautions in case of needing to visit the Emergency Department.         Clinical Presentation:    62 y.o. male here today for Gout in right toe/top of foot (Since Saturday.)  Based on Abridge AI conversational text extraction:  Discussed the use of AI scribe software for clinical note transcription with the patient, who gave verbal consent to proceed.  History of Present Illness   The patient presents with a first-time episode of severe pain in the right foot, initially localized at the base of the big toe and subsequently spreading to the inside of the right ankle. The pain has been progressively worsening, making it difficult for the patient to bear weight on the affected foot. The patient denies any associated pain during urination.  In the past, the patient has  experienced occasional discomfort in the toe, which would resolve spontaneously. However, the current episode is significantly more severe and persistent, leading to a presumptive diagnosis of gout. The patient has no known history of gout flares.  The patient has a history of cardiac issues, managed with a pacemaker and defibrillator, and is on carvedilol. The patient also has a history of an ingrown toenail treated by a podiatrist. The patient's family history is notable for longevity without significant cardiac issues, attributed to their [place] ancestry.  The patient's lifestyle includes alcohol consumption, which they acknowledge may contribute to their current condition. The patient expresses a willingness to make necessary dietary changes to manage and prevent future gout attacks.        Reviewed chart data: Active Ambulatory Problems    Diagnosis Date Noted   Degenerative joint disease of  left hip 03/27/2015   S/P shoulder replacement 11/24/2015   GI bleed 03/19/2016   Alcohol dependence in remission (HCC) 03/19/2016   Non-ischemic cardiomyopathy (HCC)    Essential hypertension 11/08/2016   Hyperlipidemia 05/27/2018   Allergic rhinitis 08/21/2015   ED (erectile dysfunction) 08/21/2015   Hyperglycemia 08/21/2015   Insomnia 08/21/2015   OSA (obstructive sleep apnea) 08/21/2015   Unspecified fracture of upper end of left humerus, initial encounter for closed fracture 09/03/2017   Status post reverse total shoulder replacement, left 11/11/2019   Paroxysmal atrial fibrillation (HCC) 04/14/2020   Chronic combined systolic and diastolic CHF, NYHA class 3 (HCC) 10/2016   S/P ICD (internal cardiac defibrillator) procedure 04/14/2020   GAD (generalized anxiety disorder) 05/31/2020   Hyponatremia 02/16/2021   Thoracic compression fracture (HCC) 02/22/2021   Aortic atherosclerosis (HCC) 03/22/2021   Osteoarthritis of right hip 05/03/2021   Osteoarthritis of left hip 05/03/2021   Avascular necrosis of left humeral head (HCC) 11/20/2018   Closed fracture of distal end of left radius 02/01/2022   Left shoulder pain 11/04/2018   Lumbar spondylosis 01/16/2022   Major depression, recurrent, full remission (HCC) 04/15/2019   Retained orthopedic hardware 11/20/2018   VT (ventricular tachycardia) (HCC) 08/28/2022   CHF (congestive heart failure) (HCC) 08/28/2022   Other fatigue 10/17/2022   Generalized obesity 10/17/2022   BMI 38.0-38.9,adult 10/17/2022   History of non-ST elevation myocardial infarction (NSTEMI) 02/18/2023   Resolved Ambulatory Problems    Diagnosis Date Noted   Elevated transaminase level 03/27/2015   Elevated troponin 03/19/2016   Acute blood loss anemia 03/19/2016   Acute upper GI bleed 03/19/2016   Acute systolic congestive heart failure, NYHA class 4 (HCC) 10/17/2016   Hyponatremia 10/18/2016   AKI (acute kidney injury) (HCC) 10/18/2016   At risk for  sudden cardiac death    Cardiomyopathy (HCC) 09-May-2018   Avascular necrosis (HCC) 10/09/2015   Class 1 obesity due to excess calories in adult 01/02/2017   Closed fracture of head of left humerus 09/02/2017   Cough 10/09/2017   Lower extremity edema 10/17/2016   Neuropathy, peripheral 08/21/2015   Palpitations 08/21/2015   S/P ORIF (open reduction internal fixation) fracture 09/08/2017   Septic joint of left shoulder region (HCC) 11/13/2019   PICC (peripherally inserted central catheter) in place 12/06/2019   Medication monitoring encounter 12/06/2019   Frequent falls 02/16/2021   Acute anemia 02/16/2021   Hypotension 02/16/2021   ETOH abuse 02/16/2021   Persistent cough 08/27/2021   Acute conjunctivitis of right eye 08/27/2021   Past Medical History:  Diagnosis Date   AICD (automatic cardioverter/defibrillator) present    Anxiety    Chronic  left hip pain    Depression    Dysrhythmia    Headache    Hypertensive heart disease with combined systolic and diastolic congestive heart failure (HCC) 10/2016   Nonischemic cardiomyopathy (HCC) 10/2016   Osteoarthritis    Right hand fracture    Seasonal allergies    Sleep apnea    Wears glasses     Outpatient Medications Prior to Visit  Medication Sig   apixaban (ELIQUIS) 5 MG TABS tablet Take 1 tablet (5 mg total) by mouth 2 (two) times daily. Restart 3/3 with the am dose.   carvedilol (COREG) 25 MG tablet Take 0.5 tablets (12.5 mg total) by mouth every morning AND 1 tablet (25 mg total) every evening.   diphenhydramine-acetaminophen (TYLENOL PM) 25-500 MG TABS tablet Take 2 tablets by mouth at bedtime.   ENTRESTO 97-103 MG TAKE 1 TABLET BY MOUTH TWICE A DAY   FARXIGA 10 MG TABS tablet TAKE 1 TABLET BY MOUTH EVERY DAY BEFORE BREAKFAST   Multiple Vitamin (MULTIVITAMIN WITH MINERALS) TABS tablet Take 1 tablet by mouth in the morning.   rosuvastatin (CRESTOR) 20 MG tablet TAKE 1 TABLET BY MOUTH EVERY DAY   sertraline (ZOLOFT) 100 MG  tablet Take 100 mg by mouth daily.   sildenafil (VIAGRA) 100 MG tablet TAKE 1 TABLET (100 MG TOTAL) BY MOUTH AT BEDTIME AS NEEDED FOR ERECTILE DYSFUNCTION.   spironolactone (ALDACTONE) 50 MG tablet TAKE 1 TABLET BY MOUTH EVERY DAY   torsemide (DEMADEX) 20 MG tablet Take 2 tablets (40 mg total) by mouth daily.   traZODone (DESYREL) 100 MG tablet Take 100 mg by mouth daily.   oxyCODONE-acetaminophen (PERCOCET) 5-325 MG tablet Take 1 tablet by mouth every 4 (four) hours as needed for severe pain. (Patient not taking: Reported on 02/18/2023)   [DISCONTINUED] traZODone (DESYREL) 50 MG tablet TAKE 1 TABLET BY MOUTH EVERY DAY AT BEDTIME AS NEEDED   No facility-administered medications prior to visit.         Clinical Data Analysis:   Physical Exam  BP 114/73 (BP Location: Right Arm, Patient Position: Sitting)   Pulse 73   Temp 98.2 F (36.8 C) (Temporal)   Ht 5\' 11"  (1.803 m)   SpO2 97%   BMI 37.94 kg/m  Wt Readings from Last 10 Encounters:  12/09/22 272 lb (123.4 kg)  11/11/22 270 lb (122.5 kg)  10/25/22 276 lb 9.6 oz (125.5 kg)  10/23/22 274 lb 6.4 oz (124.5 kg)  10/17/22 267 lb (121.1 kg)  10/07/22 265 lb (120.2 kg)  09/27/22 269 lb (122 kg)  09/26/22 271 lb (122.9 kg)  08/22/22 271 lb (122.9 kg)  07/24/22 264 lb 9.6 oz (120 kg)   Vital signs reviewed.  Nursing notes reviewed. Weight trend reviewed. Abnormalities and Problem-Specific physical exam findings:  no erythema but mild swelling R foot and tenderness over R metatarsophalangeal joint spreading to R medial ankle.  General Appearance:  No acute distress appreciable.   Well-groomed, healthy-appearing male.  Well proportioned with no abnormal fat distribution.  Good muscle tone. Skin: Clear and well-hydrated. Pulmonary:  Normal work of breathing at rest, no respiratory distress apparent. SpO2: 97 %  Musculoskeletal: All extremities are intact.  Neurological:  Awake, alert, oriented, and engaged.  No obvious focal neurological  deficits or cognitive impairments.  Sensorium seems unclouded.   Speech is clear and coherent with logical content. Psychiatric:  Appropriate mood, pleasant and cooperative demeanor, thoughtful and engaged during the exam  Results Reviewed:    No results  found for any visits on 02/18/23.  Recent Results (from the past 2160 hour(s))  CUP PACEART INCLINIC DEVICE CHECK     Status: None   Collection Time: 11/21/22  1:31 PM  Result Value Ref Range   Date Time Interrogation Session [id number]    Pulse Generator Manufacturer [employer]    Pulse Gen Model [id number] Mathews DR    Pulse Gen Serial Number [id number]    Clinic Name [facility]    Implantable Pulse Generator Type Implantable Cardiac Defibulator    Implantable Pulse Generator Implant Date [id number]    Implantable Lead Manufacturer OTHER    Implantable Lead Model [id number] [facility]    Implantable Lead Serial Number [id number]    Implantable Lead Implant Date [id number]    Implantable Lead Location Detail 1 UNKNOWN    Implantable Lead Location [id number]    Implantable Lead Connection Status [id number]    Implantable Lead Manufacturer [facility]    Implantable Lead Model [address]    Implantable Lead Serial Number [id number]    Implantable Lead Implant Date [id number]    Implantable Lead Location Detail 1 UNKNOWN    Implantable Lead Location [id number]    Implantable Lead Connection Status [id number]    Lead Channel Setting Sensing Sensitivity 0.5 mV   Lead Channel Setting Pacing Amplitude 3.5 V   Lead Channel Setting Pacing Pulse Width 0.5 ms   Lead Channel Setting Pacing Amplitude 2.5 V   Zone Setting Status Active    Zone Setting Status Inactive    Zone Setting Status [id number]    Lead Channel Impedance Value 475.0 ohm   Lead Channel Sensing Intrinsic Amplitude 5.0 mV   Lead Channel Pacing Threshold Amplitude 0.75 V   Lead Channel Pacing Threshold Pulse Width 0.5 ms   Lead Channel Pacing Threshold Amplitude 0.75 V   Lead Channel Pacing Threshold  Pulse Width 0.5 ms   Lead Channel Impedance Value 500.0 ohm   Lead Channel Sensing Intrinsic Amplitude 12.0 mV   Lead Channel Pacing Threshold Amplitude 0.75 V   Lead Channel Pacing Threshold Pulse Width 0.5 ms   Lead Channel Pacing Threshold Amplitude 0.75 V   Lead Channel Pacing Threshold Pulse Width 0.5 ms   HighPow Impedance 67.5 ohm   Battery Remaining Longevity 76 mo   Brady Statistic RA Percent Paced 86.0 %   Brady Statistic RV Percent Paced 4.9 %   Eval Rhythm AS/VS 72   CUP PACEART REMOTE DEVICE CHECK     Status: None   Collection Time: 02/12/23  3:02 AM  Result Value Ref Range   Date Time Interrogation Session [id number]    Pulse Generator Manufacturer [facility]    Pulse Gen Model [id number] [place] DR    Pulse Gen Serial Number [id number]    Clinic Name [facility]    Implantable Pulse Generator Type Implantable Cardiac Defibulator    Implantable Pulse Generator Implant Date [id number]    Implantable Lead Manufacturer OTHER    Implantable Lead Model [id number] [facility]    Implantable Lead Serial Number [id number]    Implantable Lead Implant Date [id number]    Implantable Lead Location Detail 1 UNKNOWN    Implantable Lead Location [id number]    Implantable Lead Connection Status [id number]    Implantable Lead Manufacturer [facility]    Implantable Lead Model 7122 [address]    Implantable Lead Serial Number [id number]    Implantable Lead Implant Date 16073710    Implantable Lead Location Detail 1 UNKNOWN    Implantable Lead Location [phone]  Implantable Lead Connection Status [phone]    Lead Channel Setting Sensing Sensitivity 0.5 mV   Lead Channel Setting Sensing Adaptation Mode Adaptive Sensing    Lead Channel Setting Pacing Amplitude 3.5 V   Lead Channel Setting Pacing Pulse Width 0.5 ms   Lead Channel Setting Pacing Amplitude 2.5 V   Zone Setting Status Active    Zone Setting Status Inactive    Zone Setting Status [phone]    Lead Channel Status NULL    Lead Channel Impedance Value  490 ohm   Lead Channel Sensing Intrinsic Amplitude 3.9 mV   Lead Channel Pacing Threshold Amplitude 0.75 V   Lead Channel Pacing Threshold Pulse Width 0.5 ms   Lead Channel Status NULL    Lead Channel Impedance Value 440 ohm   Lead Channel Sensing Intrinsic Amplitude 12.0 mV   Lead Channel Pacing Threshold Amplitude 0.75 V   Lead Channel Pacing Threshold Pulse Width 0.5 ms   HighPow Impedance 86 ohm   HighPow Imped Status NULL    Battery Status MOS    Battery Remaining Longevity 73 mo   Battery Remaining Percentage 93.0 %   Battery Voltage 3.02 V   Brady Statistic RA Percent Paced 87.0 %   Brady Statistic RV Percent Paced 4.5 %   Brady Statistic AP VP Percent 4.5 %   Brady Statistic AS VP Percent 1.0 %   Brady Statistic AP VS Percent 84.0 %   Brady Statistic AS VS Percent 10.0 %    No image results found.   CUP PACEART REMOTE DEVICE CHECK  Result Date: 02/13/2023 Scheduled remote reviewed. Normal device function.  1 AMS, 6sec in duration Next remote 91 days. LA, CVRS  CT Chest Wo Contrast  Result Date: 01/19/2023 CLINICAL DATA:  Tripped over curb.  Right-sided rib pain EXAM: CT CHEST WITHOUT CONTRAST TECHNIQUE: Multidetector CT imaging of the chest was performed following the standard protocol without IV contrast. RADIATION DOSE REDUCTION: This exam was performed according to the departmental dose-optimization program which includes automated exposure control, adjustment of the mA and/or kV according to patient size and/or use of iterative reconstruction technique. COMPARISON:  Chest radiograph 11/11/2022 and CT chest abdomen and pelvis 06/26/2022 FINDINGS: Cardiovascular: Normal heart size. No pericardial effusion. Left chest wall ICD. Coronary artery and aortic atherosclerotic calcification. Mediastinum/Nodes: Unremarkable esophagus.  No thoracic adenopathy. Lungs/Pleura: No focal consolidation, pleural effusion, or pneumothorax. Mild scarring in the lingula. Central airways are  patent. Upper Abdomen: No acute abnormality. Musculoskeletal: Remote right rib fractures. No acute rib fracture. Postoperative changes both shoulders. Unchanged mild superior endplate compression of T12 status post vertebroplasty. IMPRESSION: 1. No evidence of acute traumatic injury in the chest. Aortic Atherosclerosis (ICD10-I70.0). Electronically Signed   By: Minerva Fester M.D.   On: 01/19/2023 00:41   DG Hand Complete Right  Result Date: 01/19/2023 CLINICAL DATA:  Tripped over curb last night. Scraped knuckle on both hands. EXAM: RIGHT HAND - COMPLETE 3+ VIEW COMPARISON:  None Available. FINDINGS: Acute minimally displaced transverse fracture of the head of the fifth metacarpal. Mild soft tissue swelling about the knuckles. IMPRESSION: Acute minimally displaced fracture of the head of the fifth metacarpal. Electronically Signed   By: Minerva Fester M.D.   On: 01/19/2023 00:35   CT Head Wo Contrast  Result Date: 01/19/2023 CLINICAL DATA:  Fall, on Eliquis EXAM: CT HEAD WITHOUT CONTRAST TECHNIQUE: Contiguous axial images were obtained from the base of the skull through the vertex without intravenous contrast. RADIATION DOSE REDUCTION: This exam  was performed according to the departmental dose-optimization program which includes automated exposure control, adjustment of the mA and/or kV according to patient size and/or use of iterative reconstruction technique. COMPARISON:  06/26/2022 FINDINGS: Brain: No evidence of acute infarction, hemorrhage, mass, mass effect, or midline shift. No hydrocephalus or extra-axial fluid collection. Mineralization in the bilateral basal ganglia. Vascular: No hyperdense vessel. Skull: Negative for fracture or focal lesion. Sinuses/Orbits: Minimal mucosal thickening in the maxillary sinuses and ethmoid air cells. No acute finding in the orbits. Other: The mastoid air cells are well aerated. IMPRESSION: No acute intracranial process. Electronically Signed   By: Wiliam Ke  M.D.   On: 01/19/2023 00:34   CUP PACEART INCLINIC DEVICE CHECK  Result Date: 11/21/2022 Wound check appointment. Steri-strips removed. Wound without redness or edema. Incision edges approximated, wound well healed. Normal device function. Thresholds, sensing, and impedances consistent with implant measurements. Device programmed at 3.5V for  extra safety margin on acute RA lead until 3 month visit. Histogram distribution appropriate for patient and level of activity. AT/AF burden <1%, no ventricular arrhythmias noted. Patient educated about wound care, arm mobility, lifting restrictions, shock plan. ROV in 3 months with implanting physician.Syliva Overman, RN      Signed: Lula Olszewski, MD 02/18/2023 1:26 PM

## 2023-02-18 NOTE — Patient Instructions (Addendum)
It was a pleasure seeing you today! Your health and satisfaction are our top priorities.   John Hew, MD  VISIT SUMMARY:  During your visit, we discussed your severe pain in the right foot, which we believe is a first-time episode of gout. We also reviewed your history of cardiac issues and discussed the need for gastrointestinal protection due to your new medication.  YOUR PLAN:  -ACUTE GOUT FLARE: Gout is a type of arthritis that causes sudden, severe attacks of pain, swelling, redness, and tenderness in the joints. We gave you a Solu-Medrol injection for immediate relief and prescribed Toradol for pain and inflammation. We also discussed dietary changes to prevent future gout attacks. Once the acute flare is under control, we will check your uric acid levels. If gout recurs, we may prescribe Allopurinol to prevent future attacks.  -CARDIOVASCULAR RISK MANAGEMENT: Given your history of cardiac issues, we discussed the benefits of Wegovy for weight loss, gout prevention, and cardiovascular risk reduction. We will start you on a low dose and gradually increase it over 28 days. We will also add a uric acid blood check to your future lab work.  -GASTROINTESTINAL PROTECTION: Because Toradol and your blood thinner can increase the risk of stomach bleeding, we prescribed Pepcid Complete to protect your stomach.  INSTRUCTIONS:  Please take all medications as prescribed. Make the necessary dietary changes we discussed to manage and prevent future gout attacks. We will check your uric acid levels once the acute gout flare is under control. Please return for a follow-up visit to monitor your progress and adjust your treatment plan as necessary.  Next Steps:  [x]  Flexible Follow-Up: We recommend a follow up with your primary care, a specialist, or me within 1-2 weeks for ensuring your problem resolves. This allows for progress monitoring and treatment adjustments. [x]  Early Intervention: Schedule  sooner appointment, call our on-call services, or go to emergency room if there is Increase in pain or discomfort New or worsening symptoms Sudden or severe changes in your health [x]  Lab & X-ray Appointments: complete or schedule to complete today, or call to schedule.  X-rays: [facility] (M-F, 8:30am-noon or 1pm-5pm). Making the Most of Our Focused (20 minute) Follow Up Appointments:  [x]   Clearly state your top concerns at the beginning of the visit to focus our discussion [x]   If you anticipate you will need more time, please inform the front desk during scheduling - we can book multiple appointments in the same week. [x]   If you have transportation problems- use our convenient video appointments or ask about transportation support. [x]   We can get down to business faster if you use MyChart to update information before the visit and submit non-urgent questions before your visit. Thank you for taking the time to provide details through MyChart.  Let our nurse know and she can import this information into your encounter documents.  Arrival and Wait Times: [x]   Arriving on time ensures that everyone receives prompt attention. [x]   Early morning (8a) and afternoon (1p) appointments tend to have shortest wait times. [x]   Unfortunately, we cannot delay appointments for late arrivals or hold slots during phone calls.  Getting Answers and Following Up  [x]   Simple Questions & Concerns: For quick questions or basic follow-up after your visit, reach [place] at [phone] or MyChart messaging. [x]   Complex Concerns: If your concern is more complex, scheduling an appointment might be best. Discuss this with the staff to find the most  suitable option. [x]   Lab & Imaging Results: We'll contact you directly if results are abnormal or you don't use MyChart. Most normal results will be on MyChart within 2-3 business days, with a review message from Dr. Jon Billings. Haven't heard back in 2  weeks? Need results sooner? Contact us at [phone]. [x]   Referrals: Our referral coordinator will manage specialist referrals. The specialist's office should contact you within 2 weeks to schedule an appointment. Call us if you haven't heard from them after 2 weeks.  Staying Connected  [x]   MyChart: Activate your MyChart for the fastest way to access results and message [place]. See the last page of this paperwork for instructions on how to activate.  Bring to Your Next Appointment  [x]   Medications: Please bring all your medication bottles to your next appointment to ensure we have an accurate record of your prescriptions. [x]   Health Diaries: If you're monitoring any health conditions at home, keeping a diary of your readings can be very helpful for discussions at your next appointment.  Billing  [x]   X-ray & Lab Orders: These are billed by separate companies. Contact the invoicing company directly for questions or concerns. [x]   Visit Charges: Discuss any billing inquiries with our administrative services team.  Your Satisfaction Matters  [x]   Share Your Experience: We strive for your satisfaction! If you have any complaints, or preferably compliments, please let Dr. Jon Billings know directly or contact our Practice Administrators, Edwena Felty or [employer], by asking at the front desk.   Reviewing Your Records  [x]   Review this early draft of your clinical encounter notes below and the final encounter summary tomorrow on MyChart after its been completed.   Gouty arthritis of right foot  Podagra

## 2023-02-18 NOTE — Addendum Note (Signed)
Addended by: Donzetta Starch on: 02/18/2023 02:00 PM   Modules accepted: Orders

## 2023-02-19 ENCOUNTER — Telehealth: Payer: Self-pay | Admitting: Family Medicine

## 2023-02-19 NOTE — Telephone Encounter (Signed)
Cardiology previously sent Ozempic and not covered as doesn't have diabetes so we do not have a great option here- they will reject this

## 2023-02-19 NOTE — Telephone Encounter (Signed)
Patient's states Reginal Lutes is out of stock at [facility]  Requests RX for Ozempic (will require PA) be sent to [id number] [name], [place] - [address] Phone: [phone]  Fax: [phone]     Patient requests to be advised either by phone or MyChart message

## 2023-02-19 NOTE — Telephone Encounter (Signed)
See below

## 2023-02-20 ENCOUNTER — Other Ambulatory Visit: Payer: Self-pay | Admitting: Family Medicine

## 2023-02-20 ENCOUNTER — Other Ambulatory Visit: Payer: Self-pay

## 2023-02-20 ENCOUNTER — Telehealth: Payer: Self-pay

## 2023-02-20 ENCOUNTER — Other Ambulatory Visit (HOSPITAL_COMMUNITY): Payer: Self-pay

## 2023-02-20 MED ORDER — OZEMPIC (0.25 OR 0.5 MG/DOSE) 2 MG/3ML ~~LOC~~ SOPN: 0.2500 mg | PEN_INJECTOR | SUBCUTANEOUS | 0 refills | Status: DC

## 2023-02-20 NOTE — Telephone Encounter (Signed)
PA has been submitted for Ozempic.

## 2023-02-20 NOTE — Telephone Encounter (Signed)
Patient is persistent on PA for ozempic even after discussing PCP comments listed below. I have sent a message to PA team to complete PA. Advised patient approval/denial response can take 3-5 business days.

## 2023-02-20 NOTE — Telephone Encounter (Signed)
Patient is persistent on having PA completed for Ozempic, does not have dx of DM. Please attempt PA for Semaglutide,0.25 or 0.5MG /DOS, (OZEMPIC, 0.25 OR 0.5 MG/DOSE,) 2 MG/3ML SOPN   Thanks so much!

## 2023-02-20 NOTE — Telephone Encounter (Signed)
Pharmacy Patient Advocate Encounter   Received notification that prior authorization for Ozempic (0.25 or 0.5 MG/DOSE) 2MG /3ML pen-injectors is required/requested.   PA submitted to [employer] via CoverMyMeds [employer] or [facility]) confirmation # [id number] Status is pending

## 2023-02-20 NOTE — Telephone Encounter (Signed)
Patient states there is another medication option that insurance will cover: Byetta  However, Patient states he prefers Ozempic

## 2023-02-20 NOTE — Telephone Encounter (Signed)
PA submitted via CMM. Created new encounter for PA. Will route back to pool once determination has been made. 

## 2023-02-21 NOTE — Telephone Encounter (Signed)
Pharmacy Patient Advocate Encounter  Received notification from [employer] that the request for prior authorization for Ozempic (0.25 or 0.5 MG/DOSE) 2MG/3ML pen-injectors has been denied due to no indication of type 2 diabetes mellitus.       Please be advised we currently do not have a Pharmacist to review denials, therefore you will need to process appeals accordingly as needed. Thanks for your support at this time.   You may call [phone] or fax [phone], to appeal.  

## 2023-02-25 NOTE — Telephone Encounter (Signed)
Pt would like an appeal the denial for Ozempic. Please advise.

## 2023-02-26 ENCOUNTER — Other Ambulatory Visit: Payer: Self-pay

## 2023-02-26 ENCOUNTER — Ambulatory Visit: Payer: Commercial Managed Care - HMO | Admitting: Podiatry

## 2023-02-26 NOTE — Telephone Encounter (Signed)
Could you guys follow up on this please with the pharmacy.

## 2023-02-27 ENCOUNTER — Ambulatory Visit (INDEPENDENT_AMBULATORY_CARE_PROVIDER_SITE_OTHER): Payer: Commercial Managed Care - HMO | Admitting: Internal Medicine

## 2023-02-27 ENCOUNTER — Encounter: Payer: Self-pay | Admitting: Internal Medicine

## 2023-02-27 VITALS — BP 100/64 | HR 75 | Temp 97.7°F | Ht 71.0 in | Wt 277.6 lb

## 2023-02-27 DIAGNOSIS — R7303 Prediabetes: Secondary | ICD-10-CM

## 2023-02-27 DIAGNOSIS — Z6838 Body mass index (BMI) 38.0-38.9, adult: Secondary | ICD-10-CM

## 2023-02-27 DIAGNOSIS — I252 Old myocardial infarction: Secondary | ICD-10-CM

## 2023-02-27 DIAGNOSIS — I5022 Chronic systolic (congestive) heart failure: Secondary | ICD-10-CM

## 2023-02-27 DIAGNOSIS — R7309 Other abnormal glucose: Secondary | ICD-10-CM | POA: Diagnosis not present

## 2023-02-27 DIAGNOSIS — I428 Other cardiomyopathies: Secondary | ICD-10-CM

## 2023-02-27 LAB — POCT GLYCOSYLATED HEMOGLOBIN (HGB A1C): Hemoglobin A1C: 5.6 % (ref 4.0–5.6)

## 2023-02-27 MED ORDER — SEMAGLUTIDE-WEIGHT MANAGEMENT 1.7 MG/0.75ML ~~LOC~~ SOAJ
1.7000 mg | SUBCUTANEOUS | 0 refills | Status: DC
Start: 2023-05-25 — End: 2023-03-17

## 2023-02-27 MED ORDER — SEMAGLUTIDE-WEIGHT MANAGEMENT 1 MG/0.5ML ~~LOC~~ SOAJ
1.0000 mg | SUBCUTANEOUS | 0 refills | Status: DC
Start: 2023-04-26 — End: 2023-03-17

## 2023-02-27 MED ORDER — SEMAGLUTIDE-WEIGHT MANAGEMENT 0.25 MG/0.5ML ~~LOC~~ SOAJ
0.2500 mg | SUBCUTANEOUS | 0 refills | Status: DC
Start: 2023-02-27 — End: 2023-03-17

## 2023-02-27 MED ORDER — SEMAGLUTIDE-WEIGHT MANAGEMENT 0.5 MG/0.5ML ~~LOC~~ SOAJ
0.5000 mg | SUBCUTANEOUS | 0 refills | Status: DC
Start: 2023-03-28 — End: 2023-03-17

## 2023-02-27 MED ORDER — SEMAGLUTIDE-WEIGHT MANAGEMENT 2.4 MG/0.75ML ~~LOC~~ SOAJ
2.4000 mg | SUBCUTANEOUS | 0 refills | Status: DC
Start: 2023-06-23 — End: 2023-03-17

## 2023-02-27 NOTE — Telephone Encounter (Signed)
Patient was seen for an OV today concerning this.

## 2023-02-27 NOTE — Progress Notes (Signed)
[name]: [phone]   Routine Medical Office Visit  Patient:  John [name]      Age: 62 y.o.       Sex:  male  Date:   02/27/2023 PCP:    John Majestic, MD   Today's Healthcare Provider: Lula Olszewski, MD   Assessment and Plan:   AI-Extracted Assessment and Plan    Obesity: He has experienced significant weight gain over the past two years, attributing it to lifestyle changes following COVID-19 and a hip replacement. He expresses interest in weight loss medications, specifically Wegovy. His A1c, at 5.6, falls outside the diabetic range. We will resubmit prior authorization for [facility], highlighting his history of heart attack as an indication. Additionally, we advise incorporating more avocados into his diet for heart health.  History of Heart Attack: He has a history of heart attack and currently utilizes a Facilities manager. He is on [place] for heart failure, which also serves as a preventative measure against diabetes. We will continue his current cardiac medications and monitor the pacemaker/defibrillator status.  Hip Replacement: He underwent a hip replacement, which has contributed to a sedentary lifestyle and subsequent weight gain.  This is why he gained the weight and its difficult to lose it without medication support.  Potential Diabetes: His glucose levels were elevated in February, but his current A1c of 5.6 does not indicate diabetes. He is taking [place] for heart failure, which also has a preventative effect against diabetes. We will continue Farxiga for heart failure and regularly monitor A1c and glucose levels.      Researched the ideal submission for prior authorization approval and offered to fight insurance for it.  Strongly believe he has FDA cardiac indication for its use.  Charting-Extracted Assessment and Plan John [name] was seen today for one week follow-up and gouty arthritis of right foot.  History of non-ST  elevation myocardial infarction (NSTEMI) -     Semaglutide-Weight Management; Inject 0.25 mg into the skin once a week for 28 days.  Dispense: 2 mL; Refill: 0 -     Semaglutide-Weight Management; Inject 0.5 mg into the skin once a week for 28 days.  Dispense: 2 mL; Refill: 0 -     Semaglutide-Weight Management; Inject 1 mg into the skin once a week for 28 days.  Dispense: 2 mL; Refill: 0 -     Semaglutide-Weight Management; Inject 1.7 mg into the skin once a week for 28 days.  Dispense: 3 mL; Refill: 0 -     Semaglutide-Weight Management; Inject 2.4 mg into the skin once a week for 28 days.  Dispense: 3 mL; Refill: 0  Elevated glucose -     POCT glycosylated hemoglobin (Hb A1C) -     Semaglutide-Weight Management; Inject 0.25 mg into the skin once a week for 28 days.  Dispense: 2 mL; Refill: 0 -     Semaglutide-Weight Management; Inject 0.5 mg into the skin once a week for 28 days.  Dispense: 2 mL; Refill: 0 -     Semaglutide-Weight Management; Inject 1 mg into the skin once a week for 28 days.  Dispense: 2 mL; Refill: 0 -     Semaglutide-Weight Management; Inject 1.7 mg into the skin once a week for 28 days.  Dispense: 3 mL; Refill: 0 -     Semaglutide-Weight Management; Inject 2.4 mg into the skin once a week for 28 days.  Dispense: 3 mL; Refill: 0  Morbid obesity (HCC) -     Semaglutide-Weight  Management; Inject 0.25 mg into the skin once a week for 28 days.  Dispense: 2 mL; Refill: 0 -     Semaglutide-Weight Management; Inject 0.5 mg into the skin once a week for 28 days.  Dispense: 2 mL; Refill: 0 -     Semaglutide-Weight Management; Inject 1 mg into the skin once a week for 28 days.  Dispense: 2 mL; Refill: 0 -     Semaglutide-Weight Management; Inject 1.7 mg into the skin once a week for 28 days.  Dispense: 3 mL; Refill: 0 -     Semaglutide-Weight Management; Inject 2.4 mg into the skin once a week for 28 days.  Dispense: 3 mL; Refill: 0  Prediabetes -     Semaglutide-Weight Management;  Inject 0.25 mg into the skin once a week for 28 days.  Dispense: 2 mL; Refill: 0 -     Semaglutide-Weight Management; Inject 0.5 mg into the skin once a week for 28 days.  Dispense: 2 mL; Refill: 0 -     Semaglutide-Weight Management; Inject 1 mg into the skin once a week for 28 days.  Dispense: 2 mL; Refill: 0 -     Semaglutide-Weight Management; Inject 1.7 mg into the skin once a week for 28 days.  Dispense: 3 mL; Refill: 0 -     Semaglutide-Weight Management; Inject 2.4 mg into the skin once a week for 28 days.  Dispense: 3 mL; Refill: 0  Non-ischemic cardiomyopathy [facility]) Overview: Feb 2018:  2D Echo  - Left ventricle: The cavity size was mildly dilated. There was mild concentric hypertrophy. Systolic function was severely reduced. The estimated ejection fraction was in the range of 20%   to 25%. Severe diffuse hypokinesis with regional variations. There is akinesis of the entireapical myocardium. There is akinesis of the entireinferoseptal myocardium. - Mitral valve: Calcified annulus. Mild focal calcification of the anterior leaflet (medial segment(s)) and posterior leaflet (medial scallop(s)). There was mild regurgitation. - Left atrium: The atrium was moderately dilated. - Right ventricle: The cavity size was mildly dilated. Wall thickness was normal. - Right atrium: The atrium was moderately dilated. - Pulmonary arteries: PA peak pressure: 64 mm Hg (S) --> The right ventricular systolic pressure was increased consistent with moderate pulmonary hypertension.  R&LHC: Angiographically normal coronary arteries.  The left ventricle is dilated. There is severe left ventricular systolic dysfunction. LV end diastolic pressure is severely elevated. The left ventricular ejection fraction is less than 25% by visual estimate. There are LV function abnormalities. RA pressure-21/19, mean 17 RV pressure-61/12 PA pressure 60/35, mean 46 Pulmonary capillary wedge pressure 34/36, mean  33 LVEDP-41 Cardiac output/cardiac index-3.93 /1.76 (Fick), 3.49/1.57 (thermodilution)  Orders: -     Semaglutide-Weight Management; Inject 0.25 mg into the skin once a week for 28 days.  Dispense: 2 mL; Refill: 0 -     Semaglutide-Weight Management; Inject 0.5 mg into the skin once a week for 28 days.  Dispense: 2 mL; Refill: 0 -     Semaglutide-Weight Management; Inject 1 mg into the skin once a week for 28 days.  Dispense: 2 mL; Refill: 0 -     Semaglutide-Weight Management; Inject 1.7 mg into the skin once a week for 28 days.  Dispense: 3 mL; Refill: 0 -     Semaglutide-Weight Management; Inject 2.4 mg into the skin once a week for 28 days.  Dispense: 3 mL; Refill: 0  Chronic systolic congestive heart failure (HCC) -     Semaglutide-Weight Management; Inject 0.25 mg  into the skin once a week for 28 days.  Dispense: 2 mL; Refill: 0 -     Semaglutide-Weight Management; Inject 0.5 mg into the skin once a week for 28 days.  Dispense: 2 mL; Refill: 0 -     Semaglutide-Weight Management; Inject 1 mg into the skin once a week for 28 days.  Dispense: 2 mL; Refill: 0 -     Semaglutide-Weight Management; Inject 1.7 mg into the skin once a week for 28 days.  Dispense: 3 mL; Refill: 0 -     Semaglutide-Weight Management; Inject 2.4 mg into the skin once a week for 28 days.  Dispense: 3 mL; Refill: 0  BMI 38.0-38.9,adult -     Semaglutide-Weight Management; Inject 0.25 mg into the skin once a week for 28 days.  Dispense: 2 mL; Refill: 0 -     Semaglutide-Weight Management; Inject 0.5 mg into the skin once a week for 28 days.  Dispense: 2 mL; Refill: 0 -     Semaglutide-Weight Management; Inject 1 mg into the skin once a week for 28 days.  Dispense: 2 mL; Refill: 0 -     Semaglutide-Weight Management; Inject 1.7 mg into the skin once a week for 28 days.  Dispense: 3 mL; Refill: 0 -     Semaglutide-Weight Management; Inject 2.4 mg into the skin once a week for 28 days.  Dispense: 3 mL; Refill: 0            Clinical Presentation:    62 y.o. male here today for One week follow-up and Gouty arthritis of right foot (Better today.)  AI-Extracted: Discussed the use of AI scribe software for clinical note transcription with the patient, who gave verbal consent to proceed.  History of Present Illness   The patient, with a history of heart disease and a pacemaker/defibrillator implant, presents with concerns about weight gain and a desire for weight loss medication. The patient reports a significant weight gain over the past two years, attributing it to lifestyle changes due to the COVID-19 pandemic and a hip replacement surgery. The patient expresses a strong desire to lose weight, motivated by personal and social reasons.  The patient has been exploring options for weight loss medications, specifically Wegovy, but has faced insurance approval issues. The patient's insurance company has suggested alternatives such as Trulicity and Bydureon, but the patient is keen on Wegovy due to its FDA approval for weight loss in patients with a history of heart disease.  The patient also mentions a noticeable protrusion of the pacemaker/defibrillator device from the chest, which the patient's cardiologist attributes to thinning skin. The patient is scheduled to see the cardiologist for further evaluation of this issue.  The patient's recent A1C test (rechecked today) results indicate that the patient is not diabetic or even prediabetic, which may affect the insurance approval for certain weight loss medications. Despite a diet reportedly high in sugary foods, the patient has not developed diabetes.  The patient also mentions a history of heart attack and currently takes [place] for heart failure, a medication known to prevent diabetes. The patient expresses a willingness to make dietary changes, specifically incorporating more avocados, to improve heart health.        Reviewed chart data: Active Ambulatory  Problems    Diagnosis Date Noted   Degenerative joint disease of left hip 03/27/2015   S/P shoulder replacement 11/24/2015   GI bleed 03/19/2016   Alcohol dependence in remission (HCC) 03/19/2016   Non-ischemic cardiomyopathy (HCC)  Essential hypertension 11/08/2016   Hyperlipidemia 05/27/2018   Allergic rhinitis 08/21/2015   ED (erectile dysfunction) 08/21/2015   Hyperglycemia 08/21/2015   Insomnia 08/21/2015   OSA (obstructive sleep apnea) 08/21/2015   Unspecified fracture of upper end of left humerus, initial encounter for closed fracture 09/03/2017   Status post reverse total shoulder replacement, left 11/11/2019   Paroxysmal atrial fibrillation (HCC) 04/14/2020   Chronic combined systolic and diastolic CHF, NYHA class 3 (HCC) 10/2016   S/P ICD (internal cardiac defibrillator) procedure 04/14/2020   GAD (generalized anxiety disorder) 05/31/2020   Hyponatremia 02/16/2021   Thoracic compression fracture (HCC) 02/22/2021   Aortic atherosclerosis (HCC) 03/22/2021   Osteoarthritis of right hip 05/03/2021   Osteoarthritis of left hip 05/03/2021   Avascular necrosis of left humeral head (HCC) 11/20/2018   Closed fracture of distal end of left radius 02/01/2022   Left shoulder pain 11/04/2018   Lumbar spondylosis 01/16/2022   Major depression, recurrent, full remission (HCC) 04/15/2019   Retained orthopedic hardware 11/20/2018   VT (ventricular tachycardia) (HCC) 08/28/2022   CHF (congestive heart failure) (HCC) 08/28/2022   Other fatigue 10/17/2022   Generalized obesity 10/17/2022   BMI 38.0-38.9,adult 10/17/2022   History of non-ST elevation myocardial infarction (NSTEMI) 02/18/2023   Resolved Ambulatory Problems    Diagnosis Date Noted   Elevated transaminase level 03/27/2015   Elevated troponin 03/19/2016   Acute blood loss anemia 03/19/2016   Acute upper GI bleed 03/19/2016   Acute systolic congestive heart failure, NYHA class 4 (HCC) 10/17/2016   Hyponatremia  10/18/2016   AKI (acute kidney injury) (HCC) 10/18/2016   At risk for sudden cardiac death    Cardiomyopathy (HCC) 05-10-18   Avascular necrosis (HCC) 10/09/2015   Class 1 obesity due to excess calories in adult 01/02/2017   Closed fracture of head of left humerus 09/02/2017   Cough 10/09/2017   Lower extremity edema 10/17/2016   Neuropathy, peripheral 08/21/2015   Palpitations 08/21/2015   S/P ORIF (open reduction internal fixation) fracture 09/08/2017   Septic joint of left shoulder region (HCC) 11/13/2019   PICC (peripherally inserted central catheter) in place 12/06/2019   Medication monitoring encounter 12/06/2019   Frequent falls 02/16/2021   Acute anemia 02/16/2021   Hypotension 02/16/2021   ETOH abuse 02/16/2021   Persistent cough 08/27/2021   Acute conjunctivitis of right eye 08/27/2021   Past Medical History:  Diagnosis Date   AICD (automatic cardioverter/defibrillator) present    Anxiety    Chronic left hip pain    Depression    Dysrhythmia    Headache    Hypertensive heart disease with combined systolic and diastolic congestive heart failure (HCC) 10/2016   Nonischemic cardiomyopathy (HCC) 10/2016   Osteoarthritis    Right hand fracture    Seasonal allergies    Sleep apnea    Wears glasses     Outpatient Medications Prior to Visit  Medication Sig   apixaban (ELIQUIS) 5 MG TABS tablet Take 1 tablet (5 mg total) by mouth 2 (two) times daily. Restart 3/3 with the am dose.   carvedilol (COREG) 25 MG tablet Take 0.5 tablets (12.5 mg total) by mouth every morning AND 1 tablet (25 mg total) every evening.   diphenhydramine-acetaminophen (TYLENOL PM) 25-500 MG TABS tablet Take 2 tablets by mouth at bedtime.   ENTRESTO 97-103 MG TAKE 1 TABLET BY MOUTH TWICE A DAY   famotidine-calcium carbonate-magnesium hydroxide (PEPCID COMPLETE) 10-800-165 MG chewable tablet Chew 1 tablet by mouth 2 (two) times daily as needed.  FARXIGA 10 MG TABS tablet TAKE 1 TABLET BY MOUTH  EVERY DAY BEFORE BREAKFAST   ketorolac (TORADOL) 10 MG tablet Take 1 tablet (10 mg total) by mouth every 6 (six) hours as needed. Do not take with ibuprofen/aleve with this. Take with Pepcid complete.   Multiple Vitamin (MULTIVITAMIN WITH MINERALS) TABS tablet Take 1 tablet by mouth in the morning.   ondansetron (ZOFRAN) 4 MG tablet Take 1 tablet (4 mg total) by mouth every 8 (eight) hours as needed for nausea or vomiting.   oxyCODONE-acetaminophen (PERCOCET) 5-325 MG tablet Take 1 tablet by mouth every 4 (four) hours as needed for severe pain. (Patient not taking: Reported on 02/18/2023)   predniSONE (DELTASONE) 20 MG tablet Take 2 pills for 3 days, 1 pill for 4 days   rosuvastatin (CRESTOR) 20 MG tablet TAKE 1 TABLET BY MOUTH EVERY DAY   sertraline (ZOLOFT) 100 MG tablet Take 100 mg by mouth daily.   sildenafil (VIAGRA) 100 MG tablet TAKE 1 TABLET (100 MG TOTAL) BY MOUTH AT BEDTIME AS NEEDED FOR ERECTILE DYSFUNCTION.   spironolactone (ALDACTONE) 50 MG tablet TAKE 1 TABLET BY MOUTH EVERY DAY   torsemide (DEMADEX) 20 MG tablet Take 2 tablets (40 mg total) by mouth daily.   traZODone (DESYREL) 100 MG tablet Take 100 mg by mouth daily.   [DISCONTINUED] Semaglutide,0.25 or 0.5MG /DOS, (OZEMPIC, 0.25 OR 0.5 MG/DOSE,) 2 MG/3ML SOPN Inject 0.25 mg into the skin once a week.   [DISCONTINUED] WEGOVY 0.25 MG/0.5ML SOAJ    No facility-administered medications prior to visit.         Clinical Data Analysis:   Physical Exam  BP 100/64 (BP Location: Left Arm, Patient Position: Sitting)   Pulse 75   Temp 97.7 F (36.5 C) (Temporal)   Ht 5\' 11"  (1.803 m)   Wt 277 lb 9.6 oz (125.9 kg)   SpO2 97%   BMI 38.72 kg/m  Wt Readings from Last 10 Encounters:  02/27/23 277 lb 9.6 oz (125.9 kg)  12/09/22 272 lb (123.4 kg)  11/11/22 270 lb (122.5 kg)  10/25/22 276 lb 9.6 oz (125.5 kg)  10/23/22 274 lb 6.4 oz (124.5 kg)  10/17/22 267 lb (121.1 kg)  10/07/22 265 lb (120.2 kg)  09/27/22 269 lb (122 kg)   09/26/22 271 lb (122.9 kg)  08/22/22 271 lb (122.9 kg)   Vital signs reviewed.  Nursing notes reviewed. Weight trend reviewed. Abnormalities and Problem-Specific physical exam findings:  marked truncal adiposity.pacemaker shallow implant. Poor mobility.  General Appearance:  No acute distress appreciable.   Well-groomed, healthy-appearing male.  Well proportioned with no abnormal fat distribution.  Good muscle tone. Skin: Clear and well-hydrated. Pulmonary:  Normal work of breathing at rest, no respiratory distress apparent. SpO2: 97 %  Musculoskeletal: All extremities are intact.  Neurological:  Awake, alert, oriented, and engaged.  No obvious focal neurological deficits or cognitive impairments.  Sensorium seems unclouded.   Speech is clear and coherent with logical content. Psychiatric:  Appropriate mood, pleasant and cooperative demeanor, thoughtful and engaged during the exam  Results Reviewed:    Results for orders placed or performed in visit on 02/27/23  POCT HgB A1C  Result Value Ref Range   Hemoglobin A1C 5.6 4.0 - 5.6 %    Recent Results (from the past 2160 hour(s))  CUP PACEART REMOTE DEVICE CHECK     Status: None   Collection Time: 02/12/23  3:02 AM  Result Value Ref Range   Date Time Interrogation Session [phone]  Pulse Generator Manufacturer [employer]    Pulse Gen Model [id number] Gall[address] DR    Pulse Gen Serial Number [id number]    Clinic Name [facility]    Implantable Pulse Generator Type Implantable Cardiac Defibulator    Implantable Pulse Generator Implant Date [id number]    Implantable Lead Manufacturer OTHER    Implantable Lead Model [id number] [facility]    Implantable Lead Serial Number [id number]    Implantable Lead Implant Date [id number]    Implantable Lead Location Detail 1 UNKNOWN    Implantable Lead Location 132440    Implantable Lead Connection Status 102725    Implantable Lead Manufacturer [facility]    Implantable Lead Model 7122 Durata    Implantable  Lead Serial Number [id number]    Implantable Lead Implant Date [id number]    Implantable Lead Location Detail 1 UNKNOWN    Implantable Lead Location [id number]    Implantable Lead Connection Status [phone]    Lead Channel Setting Sensing Sensitivity 0.5 mV   Lead Channel Setting Sensing Adaptation Mode Adaptive Sensing    Lead Channel Setting Pacing Amplitude 3.5 V   Lead Channel Setting Pacing Pulse Width 0.5 ms   Lead Channel Setting Pacing Amplitude 2.5 V   Zone Setting Status Active    Zone Setting Status Inactive    Zone Setting Status [phone]    Lead Channel Status NULL    Lead Channel Impedance Value 490 ohm   Lead Channel Sensing Intrinsic Amplitude 3.9 mV   Lead Channel Pacing Threshold Amplitude 0.75 V   Lead Channel Pacing Threshold Pulse Width 0.5 ms   Lead Channel Status NULL    Lead Channel Impedance Value 440 ohm   Lead Channel Sensing Intrinsic Amplitude 12.0 mV   Lead Channel Pacing Threshold Amplitude 0.75 V   Lead Channel Pacing Threshold Pulse Width 0.5 ms   HighPow Impedance 86 ohm   HighPow Imped Status NULL    Battery Status MOS    Battery Remaining Longevity 73 mo   Battery Remaining Percentage 93.0 %   Battery Voltage 3.02 V   Brady Statistic RA Percent Paced 87.0 %   Brady Statistic RV Percent Paced 4.5 %   Brady Statistic AP VP Percent 4.5 %   Brady Statistic AS VP Percent 1.0 %   Brady Statistic AP VS Percent 84.0 %   Brady Statistic AS VS Percent 10.0 %  POCT HgB A1C     Status: Normal   Collection Time: 02/27/23  4:09 PM  Result Value Ref Range   Hemoglobin A1C 5.6 4.0 - 5.6 %    No image results found.   CUP PACEART REMOTE DEVICE CHECK  Result Date: 02/13/2023 Scheduled remote reviewed. Normal device function.  1 AMS, 6sec in duration Next remote 91 days. LA, CVRS  CT Chest Wo Contrast  Result Date: 01/19/2023 CLINICAL DATA:  Tripped over curb.  Right-sided rib pain EXAM: CT CHEST WITHOUT CONTRAST TECHNIQUE: Multidetector CT imaging of the chest was  performed following the standard protocol without IV contrast. RADIATION DOSE REDUCTION: This exam was performed according to the departmental dose-optimization program which includes automated exposure control, adjustment of the mA and/or kV according to patient size and/or use of iterative reconstruction technique. COMPARISON:  Chest radiograph 11/11/2022 and CT chest abdomen and pelvis 06/26/2022 FINDINGS: Cardiovascular: Normal heart size. No pericardial effusion. Left chest wall ICD. Coronary artery and aortic atherosclerotic calcification. Mediastinum/Nodes: Unremarkable esophagus.  No thoracic adenopathy. Lungs/Pleura: No focal consolidation, pleural effusion, or pneumothorax. Mild scarring in the lingula.  Central airways are patent. Upper Abdomen: No acute abnormality. Musculoskeletal: Remote right rib fractures. No acute rib fracture. Postoperative changes both shoulders. Unchanged mild superior endplate compression of T12 status post vertebroplasty. IMPRESSION: 1. No evidence of acute traumatic injury in the chest. Aortic Atherosclerosis (ICD10-I70.0). Electronically Signed   By: Minerva Fester M.D.   On: 01/19/2023 00:41   DG Hand Complete Right  Result Date: 01/19/2023 CLINICAL DATA:  Tripped over curb last night. Scraped knuckle on both hands. EXAM: RIGHT HAND - COMPLETE 3+ VIEW COMPARISON:  None Available. FINDINGS: Acute minimally displaced transverse fracture of the head of the fifth metacarpal. Mild soft tissue swelling about the knuckles. IMPRESSION: Acute minimally displaced fracture of the head of the fifth metacarpal. Electronically Signed   By: Minerva Fester M.D.   On: 01/19/2023 00:35   CT Head Wo Contrast  Result Date: 01/19/2023 CLINICAL DATA:  Fall, on Eliquis EXAM: CT HEAD WITHOUT CONTRAST TECHNIQUE: Contiguous axial images were obtained from the base of the skull through the vertex without intravenous contrast. RADIATION DOSE REDUCTION: This exam was performed according to the  departmental dose-optimization program which includes automated exposure control, adjustment of the mA and/or kV according to patient size and/or use of iterative reconstruction technique. COMPARISON:  06/26/2022 FINDINGS: Brain: No evidence of acute infarction, hemorrhage, mass, mass effect, or midline shift. No hydrocephalus or extra-axial fluid collection. Mineralization in the bilateral basal ganglia. Vascular: No hyperdense vessel. Skull: Negative for fracture or focal lesion. Sinuses/Orbits: Minimal mucosal thickening in the maxillary sinuses and ethmoid air cells. No acute finding in the orbits. Other: The mastoid air cells are well aerated. IMPRESSION: No acute intracranial process. Electronically Signed   By: Wiliam Ke M.D.   On: 01/19/2023 00:34       This encounter employed real-time, collaborative documentation. The patient actively reviewed and updated their medical record on a shared screen, ensuring transparency and facilitating joint problem-solving for the problem list, overview, and plan. This approach promotes accurate, informed care. The treatment plan was discussed and reviewed in detail, including medication safety, potential side effects, and all patient questions. We confirmed understanding and comfort with the plan. Follow-up instructions were established, including contacting the office for any concerns, returning if symptoms worsen, persist, or new symptoms develop, and precautions for potential emergency department visits. ----------------------------------------------------- John Olszewski, MD  02/28/2023 8:42 PM  [facility]:  [phone]

## 2023-02-28 NOTE — Patient Instructions (Signed)
VISIT SUMMARY:  During our visit, we discussed your concerns about weight gain and your interest in weight loss medication, specifically Wegovy. We also talked about the noticeable protrusion of your pacemaker/defibrillator device and your history of heart disease. Your recent A1C test results indicate that you are not diabetic or even prediabetic, which may affect the insurance approval for certain weight loss medications. You expressed a willingness to make dietary changes, specifically incorporating more avocados, to improve heart health.  YOUR PLAN:  -OBESITY: You have gained significant weight over the past two years due to lifestyle changes following COVID-19 and a hip replacement. We will resubmit prior authorization for Wegovy, a weight loss medication you are interested in. We also recommend incorporating more avocados into your diet for heart health.  -HISTORY OF HEART ATTACK: You have a history of heart attack and currently have a pacemaker/defibrillator. You are on Farxiga for heart failure, which also helps prevent diabetes. We will continue your current cardiac medications and monitor the pacemaker/defibrillator status. This data should force insurance to pay for [facility] with the new FDA indication, as Reginal Lutes will lower your risk of heart attacks.  -HIP REPLACEMENT: Your hip replacement has contributed to a sedentary lifestyle and subsequent weight gain. We did not discuss a specific plan for this issue during this visit.However, we need to develop a strategy for gaining back muscle.  -POTENTIAL DIABETES: Your glucose levels were elevated in February, but your current A1c of 5.6 does not indicate diabetes. You are taking Farxiga for heart failure, which also helps prevent diabetes. We will continue Farxiga for heart failure and regularly monitor your A1c and glucose levels.  INSTRUCTIONS:  Please continue taking your current medications as prescribed. We will resubmit the prior  authorization for [facility] and will let you know once we have an update. In the meantime, try to incorporate more avocados into your diet for heart health. We will continue to monitor your A1c and glucose levels regularly. Please follow up with your cardiologist as scheduled for further evaluation of your pacemaker/defibrillator device.

## 2023-03-06 ENCOUNTER — Encounter: Payer: Self-pay | Admitting: Internal Medicine

## 2023-03-06 ENCOUNTER — Ambulatory Visit (INDEPENDENT_AMBULATORY_CARE_PROVIDER_SITE_OTHER): Payer: Commercial Managed Care - HMO | Admitting: Internal Medicine

## 2023-03-06 VITALS — BP 98/66 | HR 73 | Temp 98.0°F | Ht 71.0 in | Wt 269.8 lb

## 2023-03-06 DIAGNOSIS — Z0000 Encounter for general adult medical examination without abnormal findings: Secondary | ICD-10-CM

## 2023-03-06 DIAGNOSIS — D7589 Other specified diseases of blood and blood-forming organs: Secondary | ICD-10-CM | POA: Diagnosis not present

## 2023-03-06 DIAGNOSIS — Z13220 Encounter for screening for lipoid disorders: Secondary | ICD-10-CM | POA: Diagnosis not present

## 2023-03-06 DIAGNOSIS — E611 Iron deficiency: Secondary | ICD-10-CM | POA: Diagnosis not present

## 2023-03-06 DIAGNOSIS — I252 Old myocardial infarction: Secondary | ICD-10-CM

## 2023-03-06 DIAGNOSIS — I5022 Chronic systolic (congestive) heart failure: Secondary | ICD-10-CM

## 2023-03-06 DIAGNOSIS — I2510 Atherosclerotic heart disease of native coronary artery without angina pectoris: Secondary | ICD-10-CM

## 2023-03-06 DIAGNOSIS — F1021 Alcohol dependence, in remission: Secondary | ICD-10-CM

## 2023-03-06 DIAGNOSIS — Z4502 Encounter for adjustment and management of automatic implantable cardiac defibrillator: Secondary | ICD-10-CM

## 2023-03-06 MED ORDER — SEMAGLUTIDE-WEIGHT MANAGEMENT 0.25 MG/0.5ML ~~LOC~~ SOAJ
0.2500 mg | SUBCUTANEOUS | 0 refills | Status: DC
Start: 2023-03-06 — End: 2023-03-17

## 2023-03-06 MED ORDER — SEMAGLUTIDE-WEIGHT MANAGEMENT 1.7 MG/0.75ML ~~LOC~~ SOAJ
1.7000 mg | SUBCUTANEOUS | 0 refills | Status: DC
Start: 2023-06-01 — End: 2023-03-17

## 2023-03-06 MED ORDER — SEMAGLUTIDE-WEIGHT MANAGEMENT 1 MG/0.5ML ~~LOC~~ SOAJ
1.0000 mg | SUBCUTANEOUS | 0 refills | Status: DC
Start: 2023-05-03 — End: 2023-03-17

## 2023-03-06 MED ORDER — SEMAGLUTIDE-WEIGHT MANAGEMENT 0.5 MG/0.5ML ~~LOC~~ SOAJ
0.5000 mg | SUBCUTANEOUS | 0 refills | Status: DC
Start: 2023-04-04 — End: 2023-03-17

## 2023-03-06 MED ORDER — SEMAGLUTIDE-WEIGHT MANAGEMENT 2.4 MG/0.75ML ~~LOC~~ SOAJ
2.4000 mg | SUBCUTANEOUS | 0 refills | Status: DC
Start: 2023-06-30 — End: 2023-03-17

## 2023-03-06 MED ORDER — B-12 1000 MCG PO CAPS
1.0000 | ORAL_CAPSULE | Freq: Every day | ORAL | 3 refills | Status: DC
Start: 2023-03-06 — End: 2023-09-06

## 2023-03-06 NOTE — Progress Notes (Signed)
Remote ICD transmission.   

## 2023-03-06 NOTE — Progress Notes (Signed)
[facility]: [phone]   Routine Medical Office Visit  Patient:  John [name]      Age: 62 y.o.       Sex:  male  Date:   03/06/2023 PCP:    Shelva Majestic, MD   Today's Healthcare Provider: Lula Olszewski, MD   Assessment and Plan:   Refined prior visit to help get [facility]- he reports wife was unable to pick up without 1100$     Keiran was seen today for wegovy affordability discussion, heart problem and weight loss.  History of non-ST elevation myocardial infarction (NSTEMI) Assessment & Plan: Cardiovascular Disease: Has history of myocardial infarction in February 2024 and subsequent AICD placement, with catheter confirmed CAD we discussed the benefits of Wegovy for cardiac risk reduction and weight loss and its recent FDA approval. We discussed why/how we were not able to get it approved on prior submission. We will attempt to obtain [facility] through pharmacy and insurance again, with increased documentation and if unsuccessful, a peer-to-peer discussion with insurance will be considered. Switching insurance may be necessary if the current plan does not cover Wegovy despite these efforts.  Given his severe CAD and morbid obesity without diabetes, it is one of the most beneficial medication(s) he could take.  Orders: -     Semaglutide-Weight Management; Inject 0.25 mg into the skin once a week for 28 days. History of myocardial infarction 10/2022.  Has coexistent CHF ejection fraction 20%.  Has AICD.  Needs cardiac benefit of Wegovy.  Does not have Diabetes.  FDA has approved [facility] for cardiac risk reduction and medicare covers it for patients in this situation.  Willing to do peer 2 peer or prior authorize. (Patient not taking: Reported on 03/07/2023)  Dispense: 2 mL; Refill: 0 -     Semaglutide-Weight Management; Inject 0.5 mg into the skin once a week for 28 days. (Patient not taking: Reported on 03/07/2023)  Dispense: 2 mL; Refill: 0 -      Semaglutide-Weight Management; Inject 1 mg into the skin once a week for 28 days. (Patient not taking: Reported on 03/07/2023)  Dispense: 2 mL; Refill: 0 -     Semaglutide-Weight Management; Inject 1.7 mg into the skin once a week for 28 days. (Patient not taking: Reported on 03/07/2023)  Dispense: 3 mL; Refill: 0 -     Semaglutide-Weight Management; Inject 2.4 mg into the skin once a week for 28 days. (Patient not taking: Reported on 03/07/2023)  Dispense: 3 mL; Refill: 0  Chronic systolic congestive heart failure (HCC) -     Brain natriuretic peptide -     CBC with Differential/Platelet -     Comprehensive metabolic panel -     Methylmalonic acid, serum -     Vitamin B12 -     Lipid panel -     Magnesium  Iron deficiency -     Iron, TIBC and Ferritin Panel  Macrocytosis -     B-12; Take 1 tablet by mouth daily at 6 (six) AM.  Dispense: 90 capsule; Refill: 3 -     Vitamin B12 -     Reticulocytes; Future -     Pathologist smear review -     Protein electrophoresis, serum  Serum phosphate elevated -     Phosphorus  Coronary artery disease involving native coronary artery of native heart without angina pectoris Overview: Coronary CT angiography 10/30/2022 did not show evidence of significant coronary stenoses, although he did have moderate stenoses  in the proximal LAD and first diagonal arteries (50 to 69%) and mild stenoses in the mid circumflex, OM 2, mid RCA (25-49%) and the calcium score was markedly elevated at 1750 (98th percentile for age and gender).  None of the coronary lesions were significant by FFR.  Aortic atherosclerosis and a normal caliber aorta was incidentally noted.   AICD discharge Overview: He had an appropriate defibrillator shock for polymorphic ventricular tachycardia 226 bpm on 07/21/2022.  Several other brief episodes of nonsustained polymorphic tachycardia were seen, all of them seemed to have a pause mediated onset.       Morbid obesity due to excess  calories (HCC) Overview: Increased body mass noted.  Body mass index is 37.63 kg/m.  Well proportioned with no abnormal fat distribution.  Good muscle tone.  Lab Results  Component Value Date   TSH 1.63 08/22/2022   Lab Results  Component Value Date   HGBA1C 5.6 02/27/2023    Wt Readings from Last 10 Encounters:  03/07/23 274 lb (124.3 kg)  03/06/23 269 lb 12.8 oz (122.4 kg)  02/27/23 277 lb 9.6 oz (125.9 kg)  12/09/22 272 lb (123.4 kg)  11/11/22 270 lb (122.5 kg)  10/25/22 276 lb 9.6 oz (125.5 kg)  10/23/22 274 lb 6.4 oz (124.5 kg)  10/17/22 267 lb (121.1 kg)  10/07/22 265 lb (120.2 kg)  09/27/22 269 lb (122 kg)   He underwent a hip replacement, which has contributed to a sedentary lifestyle and subsequent weight gain.  This is why he gained the weight and its difficult to lose it without medication support.   Healthcare maintenance  Alcohol dependence in remission [facility]) Overview: Reports alcohol use due to shoulder pain previously. In 2021 down to 12 per week- want to cut further due to nonischemic cardiomyopathy.  Later reported by family/friends heavier intake-need to reduce benzos and Ambien  Assessment & Plan: Persistent issue. I related this to his CHF and need to stop.  Also his elevated MCV and need to supplement vitamins.  Possibly part of nonischemic cardiomyopathy, but he also has coronary artery disease confirmed We will reduce alcohol intake and start over-the-counter vitamin B12 supplementation.now.   Other orders -     LDL cholesterol, direct    General Health Maintenance: Previous tests revealed multiple lab abnormalities, and shortness of breath has been reported, potentially due to weight and heart failure. We will order comprehensive blood work to assess the current status and monitor for potential complications. Continued weight loss efforts and regular exercise are encouraged.  Follow-up: An appointment with Dr. Durene Cal is scheduled for 03/14/2023 to  review lab results (ordered/completed today to facilitate) and continue the management plan.         Clinical Presentation:    62 y.o. male here today for [facility] affordability discussion, Heart Problem, and Weight Loss  AI-Extracted: Discussed the use of AI scribe software for clinical note transcription with the patient, who gave verbal consent to proceed.  History of Present Illness   The patient, a 62 year old with a history of heart failure and recent cardiac events, presented for a follow-up consultation. The primary concern was the difficulty in obtaining the prescribed medication, Wegovy, due to insurance issues. The patient reported that the pharmacy had quoted a price of approximately $1100 for the medication, which was unaffordable for him.  The patient's recent cardiac history includes a heart attack in early February, followed by the implantation of a defibrillator and pacemaker. The patient reported an episode where the  defibrillator was activated, describing the sensation as a burning feeling originating from the lungs. The patient's cardiac condition has resulted in shortness of breath, particularly noticeable when attempting to walk longer distances.  In addition to cardiac issues, the patient has a history of falls, which have led to emergency room visits. The patient also reported a significant weight gain, which he attributes to a combination of reduced physical activity due to his cardiac condition and increased alcohol consumption. The patient expressed a desire to lose weight and increase physical activity levels.  The patient is currently on full disability and has private insurance through [place]. The patient's spouse also has separate insurance coverage. The patient expressed confusion about his insurance situation and was unsure if he was eligible for Medicare due to his disability status.  The patient's lifestyle changes include a reduction in alcohol consumption and the  addition of a multivitamin to his daily regimen. The patient reported a decrease in salt intake and an increase in physical activity, specifically mentioning the use of an exercise bike. Despite these changes, the patient has only experienced a minor weight loss.  The patient's medication regimen includes cholesterol-lowering medication, although the timing of the last cholesterol check was uncertain. The patient reported taking a multivitamin daily, but was advised to ensure it was taken in the morning, separate from any alcohol consumption, to maximize absorption.  In summary, the patient is a 62 year old with a complex cardiac history, including a recent heart attack and the implantation of a defibrillator and pacemaker. The patient is struggling with weight gain, reduced physical activity, and issues with obtaining prescribed medication due to insurance complications. The patient has made lifestyle changes to address these issues but continues to experience challenges.        Reviewed chart data: Active Ambulatory Problems    Diagnosis Date Noted   Degenerative joint disease of left hip 03/27/2015   S/P shoulder replacement 11/24/2015   GI bleed 03/19/2016   Alcohol dependence in remission (HCC) 03/19/2016   Non-ischemic cardiomyopathy (HCC)    Essential hypertension 11/08/2016   Hyperlipidemia 05/27/2018   Allergic rhinitis 08/21/2015   ED (erectile dysfunction) 08/21/2015   Hyperglycemia 08/21/2015   Insomnia 08/21/2015   OSA (obstructive sleep apnea) 08/21/2015   Unspecified fracture of upper end of left humerus, initial encounter for closed fracture 09/03/2017   Status post reverse total shoulder replacement, left 11/11/2019   Paroxysmal atrial fibrillation (HCC) 04/14/2020   Chronic combined systolic and diastolic CHF, NYHA class 3 (HCC) 10/2016   S/P ICD (internal cardiac defibrillator) procedure 04/14/2020   GAD (generalized anxiety disorder) 05/31/2020   Hyponatremia  02/16/2021   Thoracic compression fracture (HCC) 02/22/2021   Aortic atherosclerosis (HCC) 03/22/2021   Osteoarthritis of right hip 05/03/2021   Osteoarthritis of left hip 05/03/2021   Avascular necrosis of left humeral head (HCC) 11/20/2018   Closed fracture of distal end of left radius 02/01/2022   Left shoulder pain 11/04/2018   Lumbar spondylosis 01/16/2022   Major depression, recurrent, full remission (HCC) 04/15/2019   Retained orthopedic hardware 11/20/2018   VT (ventricular tachycardia) (HCC) 08/28/2022   CHF (congestive heart failure) (HCC) 08/28/2022   Other fatigue 10/17/2022   Morbid obesity due to excess calories (HCC) 10/17/2022   History of non-ST elevation myocardial infarction (NSTEMI) 02/18/2023   CAD (coronary artery disease) 03/08/2023   AICD discharge 03/08/2023   Resolved Ambulatory Problems    Diagnosis Date Noted   Elevated transaminase level 03/27/2015   Elevated troponin 03/19/2016  Acute blood loss anemia 03/19/2016   Acute upper GI bleed 03/19/2016   Acute systolic congestive heart failure, NYHA class 4 (HCC) 10/17/2016   Hyponatremia 10/18/2016   AKI (acute kidney injury) (HCC) 10/18/2016   At risk for sudden cardiac death    Cardiomyopathy (HCC) 05/16/2018   Avascular necrosis (HCC) 10/09/2015   Class 1 obesity due to excess calories in adult 01/02/2017   Closed fracture of head of left humerus 09/02/2017   Cough 10/09/2017   Lower extremity edema 10/17/2016   Neuropathy, peripheral 08/21/2015   Palpitations 08/21/2015   S/P ORIF (open reduction internal fixation) fracture 09/08/2017   Septic joint of left shoulder region (HCC) 11/13/2019   PICC (peripherally inserted central catheter) in place 12/06/2019   Medication monitoring encounter 12/06/2019   Frequent falls 02/16/2021   Acute anemia 02/16/2021   Hypotension 02/16/2021   ETOH abuse 02/16/2021   Persistent cough 08/27/2021   Acute conjunctivitis of right eye 08/27/2021   Generalized  obesity 10/17/2022   Past Medical History:  Diagnosis Date   AICD (automatic cardioverter/defibrillator) present    Anxiety    Chronic left hip pain    Depression    Dysrhythmia    Headache    Hypertensive heart disease with combined systolic and diastolic congestive heart failure (HCC) 10/2016   Nonischemic cardiomyopathy (HCC) 10/2016   Osteoarthritis    Right hand fracture    Seasonal allergies    Sleep apnea    Wears glasses     Outpatient Medications Prior to Visit  Medication Sig   apixaban (ELIQUIS) 5 MG TABS tablet Take 1 tablet (5 mg total) by mouth 2 (two) times daily. Restart 3/3 with the am dose.   carvedilol (COREG) 25 MG tablet Take 0.5 tablets (12.5 mg total) by mouth every morning AND 1 tablet (25 mg total) every evening.   diphenhydramine-acetaminophen (TYLENOL PM) 25-500 MG TABS tablet Take 2 tablets by mouth at bedtime.   ENTRESTO 97-103 MG TAKE 1 TABLET BY MOUTH TWICE A DAY   famotidine-calcium carbonate-magnesium hydroxide (PEPCID COMPLETE) 10-800-165 MG chewable tablet Chew 1 tablet by mouth 2 (two) times daily as needed.   FARXIGA 10 MG TABS tablet TAKE 1 TABLET BY MOUTH EVERY DAY BEFORE BREAKFAST   Multiple Vitamin (MULTIVITAMIN WITH MINERALS) TABS tablet Take 1 tablet by mouth in the morning.   rosuvastatin (CRESTOR) 20 MG tablet TAKE 1 TABLET BY MOUTH EVERY DAY   Semaglutide-Weight Management 0.25 MG/0.5ML SOAJ Inject 0.25 mg into the skin once a week for 28 days. (Patient not taking: Reported on 03/07/2023)   [START ON 03/28/2023] Semaglutide-Weight Management 0.5 MG/0.5ML SOAJ Inject 0.5 mg into the skin once a week for 28 days. (Patient not taking: Reported on 03/07/2023)   [START ON 04/26/2023] Semaglutide-Weight Management 1 MG/0.5ML SOAJ Inject 1 mg into the skin once a week for 28 days. (Patient not taking: Reported on 03/07/2023)   [START ON 05/25/2023] Semaglutide-Weight Management 1.7 MG/0.75ML SOAJ Inject 1.7 mg into the skin once a week for 28 days.  (Patient not taking: Reported on 03/07/2023)   [START ON 06/23/2023] Semaglutide-Weight Management 2.4 MG/0.75ML SOAJ Inject 2.4 mg into the skin once a week for 28 days. (Patient not taking: Reported on 03/07/2023)   sertraline (ZOLOFT) 100 MG tablet Take 100 mg by mouth daily.   sildenafil (VIAGRA) 100 MG tablet TAKE 1 TABLET (100 MG TOTAL) BY MOUTH AT BEDTIME AS NEEDED FOR ERECTILE DYSFUNCTION.   spironolactone (ALDACTONE) 50 MG tablet TAKE 1 TABLET BY MOUTH  EVERY DAY (Patient taking differently: Take 25 mg by mouth daily.)   torsemide (DEMADEX) 20 MG tablet Take 2 tablets (40 mg total) by mouth daily.   traZODone (DESYREL) 100 MG tablet Take 100 mg by mouth daily.   [DISCONTINUED] ketorolac (TORADOL) 10 MG tablet Take 1 tablet (10 mg total) by mouth every 6 (six) hours as needed. Do not take with ibuprofen/aleve with this. Take with Pepcid complete.   [DISCONTINUED] ondansetron (ZOFRAN) 4 MG tablet Take 1 tablet (4 mg total) by mouth every 8 (eight) hours as needed for nausea or vomiting.   [DISCONTINUED] oxyCODONE-acetaminophen (PERCOCET) 5-325 MG tablet Take 1 tablet by mouth every 4 (four) hours as needed for severe pain.   [DISCONTINUED] predniSONE (DELTASONE) 20 MG tablet Take 2 pills for 3 days, 1 pill for 4 days   No facility-administered medications prior to visit.         Clinical Data Analysis:   Physical Exam  BP 98/66 (BP Location: Left Arm, Patient Position: Sitting)   Pulse 73   Temp 98 F (36.7 C) (Temporal)   Ht 5\' 11"  (1.803 m)   Wt 269 lb 12.8 oz (122.4 kg)   SpO2 97%   BMI 37.63 kg/m  Wt Readings from Last 10 Encounters:  03/07/23 274 lb (124.3 kg)  03/06/23 269 lb 12.8 oz (122.4 kg)  02/27/23 277 lb 9.6 oz (125.9 kg)  12/09/22 272 lb (123.4 kg)  11/11/22 270 lb (122.5 kg)  10/25/22 276 lb 9.6 oz (125.5 kg)  10/23/22 274 lb 6.4 oz (124.5 kg)  10/17/22 267 lb (121.1 kg)  10/07/22 265 lb (120.2 kg)  09/27/22 269 lb (122 kg)   Vital signs reviewed.  Nursing  notes reviewed. Weight trend reviewed. Abnormalities and Problem-Specific physical exam findings:  marked truncal adiposity.pacemaker shallow implant. Poor mobility.  General Appearance:  No acute distress appreciable.   Well-groomed, healthy-appearing male.  Well proportioned with no abnormal fat distribution.  Good muscle tone. Skin: Clear and well-hydrated. Pulmonary:  Normal work of breathing at rest, no respiratory distress apparent. SpO2: 97 %  Musculoskeletal: All extremities are intact.  Neurological:  Awake, alert, oriented, and engaged.  No obvious focal neurological deficits or cognitive impairments.  Sensorium seems unclouded.   Speech is clear and coherent with logical content. Psychiatric:  Appropriate mood, pleasant and cooperative demeanor, thoughtful and engaged during the exam  Results Reviewed:    Results for orders placed or performed in visit on 03/06/23  B Nat Peptide  Result Value Ref Range   Pro B Natriuretic peptide (BNP) 221.0 (H) 0.0 - 100.0 pg/mL  CBC with Differential/Platelet  Result Value Ref Range   WBC 8.5 4.0 - 10.5 K/uL   RBC 3.96 (L) 4.22 - 5.81 Mil/uL   Hemoglobin 12.9 (L) 13.0 - 17.0 g/dL   HCT 41.3 24.4 - 01.0 %   MCV 100.7 (H) 78.0 - 100.0 fl   MCHC 32.3 30.0 - 36.0 g/dL   RDW 27.2 53.6 - 64.4 %   Platelets 261.0 150.0 - 400.0 K/uL   Neutrophils Relative % 71.6 43.0 - 77.0 %   Lymphocytes Relative 15.3 12.0 - 46.0 %   Monocytes Relative 10.5 3.0 - 12.0 %   Eosinophils Relative 1.9 0.0 - 5.0 %   Basophils Relative 0.7 0.0 - 3.0 %   Neutro Abs 6.1 1.4 - 7.7 K/uL   Lymphs Abs 1.3 0.7 - 4.0 K/uL   Monocytes Absolute 0.9 0.1 - 1.0 K/uL   Eosinophils Absolute 0.2 0.0 -  0.7 K/uL   Basophils Absolute 0.1 0.0 - 0.1 K/uL  Comp Met (CMET)  Result Value Ref Range   Sodium 139 135 - 145 mEq/L   Potassium 4.2 3.5 - 5.1 mEq/L   Chloride 98 96 - 112 mEq/L   CO2 30 19 - 32 mEq/L   Glucose, Bld 121 (H) 70 - 99 mg/dL   BUN 17 6 - 23 mg/dL   Creatinine,  Ser 0.98 0.40 - 1.50 mg/dL   Total Bilirubin 0.7 0.2 - 1.2 mg/dL   Alkaline Phosphatase 69 39 - 117 U/L   AST 20 0 - 37 U/L   ALT 27 0 - 53 U/L   Total Protein 7.1 6.0 - 8.3 g/dL   Albumin 4.3 3.5 - 5.2 g/dL   GFR 11.91 >47.82 mL/min   Calcium 9.6 8.4 - 10.5 mg/dL  N56  Result Value Ref Range   Vitamin B-12 297 211 - 911 pg/mL  Lipid panel  Result Value Ref Range   Cholesterol 170 0 - 200 mg/dL   Triglycerides 213.0 (H) 0.0 - 149.0 mg/dL   HDL 86.57 >84.69 mg/dL   VLDL 62.9 (H) 0.0 - 52.8 mg/dL   Total CHOL/HDL Ratio 3    NonHDL 121.09   Magnesium  Result Value Ref Range   Magnesium 2.2 1.5 - 2.5 mg/dL  Phosphorus  Result Value Ref Range   Phosphorus 4.0 2.3 - 4.6 mg/dL  Pathologist smear review  Result Value Ref Range   Path Review    Iron, TIBC and Ferritin Panel  Result Value Ref Range   Iron 141 50 - 180 mcg/dL   TIBC 413 244 - 010 mcg/dL (calc)   %SAT 36 20 - 48 % (calc)   Ferritin 84 24 - 380 ng/mL  LDL cholesterol, direct  Result Value Ref Range   Direct LDL 85.0 mg/dL     Recent Results (from the past 2160 hour(s))  CUP PACEART REMOTE DEVICE CHECK     Status: None   Collection Time: 02/12/23  3:02 AM  Result Value Ref Range   Date Time Interrogation Session [id number]    Pulse Generator Manufacturer [facility]    Pulse Gen Model [id number] Nankin DR    Pulse Gen Serial Number [id number]    Clinic Name [facility]    Implantable Pulse Generator Type Implantable Cardiac Defibulator    Implantable Pulse Generator Implant Date 84166063    Implantable Lead Manufacturer OTHER    Implantable Lead Model [id number]/32 [facility]    Implantable Lead Serial Number [id number]    Implantable Lead Implant Date 54270623    Implantable Lead Location Detail 1 UNKNOWN    Implantable Lead Location [id number]    Implantable Lead Connection Status [id number]    Implantable Lead Manufacturer [facility]    Implantable Lead Model 7122 [address]    Implantable Lead Serial Number [id number]     Implantable Lead Implant Date 76283151    Implantable Lead Location Detail 1 UNKNOWN    Implantable Lead Location [id number]    Implantable Lead Connection Status [id number]    Lead Channel Setting Sensing Sensitivity 0.5 mV   Lead Channel Setting Sensing Adaptation Mode Adaptive Sensing    Lead Channel Setting Pacing Amplitude 3.5 V   Lead Channel Setting Pacing Pulse Width 0.5 ms   Lead Channel Setting Pacing Amplitude 2.5 V   Zone Setting Status Active    Zone Setting Status Inactive    Zone Setting Status [id number]    Lead Channel Status NULL  Lead Channel Impedance Value 490 ohm   Lead Channel Sensing Intrinsic Amplitude 3.9 mV   Lead Channel Pacing Threshold Amplitude 0.75 V   Lead Channel Pacing Threshold Pulse Width 0.5 ms   Lead Channel Status NULL    Lead Channel Impedance Value 440 ohm   Lead Channel Sensing Intrinsic Amplitude 12.0 mV   Lead Channel Pacing Threshold Amplitude 0.75 V   Lead Channel Pacing Threshold Pulse Width 0.5 ms   HighPow Impedance 86 ohm   HighPow Imped Status NULL    Battery Status MOS    Battery Remaining Longevity 73 mo   Battery Remaining Percentage 93.0 %   Battery Voltage 3.02 V   Brady Statistic RA Percent Paced 87.0 %   Brady Statistic RV Percent Paced 4.5 %   Brady Statistic AP VP Percent 4.5 %   Brady Statistic AS VP Percent 1.0 %   Brady Statistic AP VS Percent 84.0 %   Brady Statistic AS VS Percent 10.0 %  POCT HgB A1C     Status: Normal   Collection Time: 02/27/23  4:09 PM  Result Value Ref Range   Hemoglobin A1C 5.6 4.0 - 5.6 %  B Nat Peptide     Status: Abnormal   Collection Time: 03/06/23  3:38 PM  Result Value Ref Range   Pro B Natriuretic peptide (BNP) 221.0 (H) 0.0 - 100.0 pg/mL  CBC with Differential/Platelet     Status: Abnormal   Collection Time: 03/06/23  3:38 PM  Result Value Ref Range   WBC 8.5 4.0 - 10.5 K/uL   RBC 3.96 (L) 4.22 - 5.81 Mil/uL   Hemoglobin 12.9 (L) 13.0 - 17.0 g/dL   HCT 65.7 84.6 - 96.2 %   MCV 100.7  (H) 78.0 - 100.0 fl   MCHC 32.3 30.0 - 36.0 g/dL   RDW 95.2 84.1 - 32.4 %   Platelets 261.0 150.0 - 400.0 K/uL   Neutrophils Relative % 71.6 43.0 - 77.0 %   Lymphocytes Relative 15.3 12.0 - 46.0 %   Monocytes Relative 10.5 3.0 - 12.0 %   Eosinophils Relative 1.9 0.0 - 5.0 %   Basophils Relative 0.7 0.0 - 3.0 %   Neutro Abs 6.1 1.4 - 7.7 K/uL   Lymphs Abs 1.3 0.7 - 4.0 K/uL   Monocytes Absolute 0.9 0.1 - 1.0 K/uL   Eosinophils Absolute 0.2 0.0 - 0.7 K/uL   Basophils Absolute 0.1 0.0 - 0.1 K/uL  Comp Met (CMET)     Status: Abnormal   Collection Time: 03/06/23  3:38 PM  Result Value Ref Range   Sodium 139 135 - 145 mEq/L   Potassium 4.2 3.5 - 5.1 mEq/L   Chloride 98 96 - 112 mEq/L   CO2 30 19 - 32 mEq/L   Glucose, Bld 121 (H) 70 - 99 mg/dL   BUN 17 6 - 23 mg/dL   Creatinine, Ser 4.01 0.40 - 1.50 mg/dL   Total Bilirubin 0.7 0.2 - 1.2 mg/dL   Alkaline Phosphatase 69 39 - 117 U/L   AST 20 0 - 37 U/L   ALT 27 0 - 53 U/L   Total Protein 7.1 6.0 - 8.3 g/dL   Albumin 4.3 3.5 - 5.2 g/dL   GFR 02.72 >53.66 mL/min    Comment: Calculated using the CKD-EPI Creatinine Equation (2021)   Calcium 9.6 8.4 - 10.5 mg/dL  Y40     Status: None   Collection Time: 03/06/23  3:38 PM  Result Value Ref Range  Vitamin B-12 297 211 - 911 pg/mL  Lipid panel     Status: Abnormal   Collection Time: 03/06/23  3:38 PM  Result Value Ref Range   Cholesterol 170 0 - 200 mg/dL    Comment: ATP III Classification       Desirable:  < 200 mg/dL               Borderline High:  200 - 239 mg/dL          High:  > = 086 mg/dL   Triglycerides 578.4 (H) 0.0 - 149.0 mg/dL    Comment: Normal:  <696 mg/dLBorderline High:  150 - 199 mg/dL   HDL 29.52 >84.13 mg/dL   VLDL 24.4 (H) 0.0 - 01.0 mg/dL   Total CHOL/HDL Ratio 3     Comment:                Men          Women1/2 Average Risk     3.4          3.3Average Risk          5.0          4.42X Average Risk          9.6          7.13X Average Risk          15.0          11.0                        NonHDL 121.09     Comment: NOTE:  Non-HDL goal should be 30 mg/dL higher than patient's LDL goal (i.e. LDL goal of < 70 mg/dL, would have non-HDL goal of < 100 mg/dL)  Magnesium     Status: None   Collection Time: 03/06/23  3:38 PM  Result Value Ref Range   Magnesium 2.2 1.5 - 2.5 mg/dL  Phosphorus     Status: None   Collection Time: 03/06/23  3:38 PM  Result Value Ref Range   Phosphorus 4.0 2.3 - 4.6 mg/dL  Pathologist smear review     Status: None   Collection Time: 03/06/23  3:38 PM  Result Value Ref Range   Path Review      Comment: Myeloid population consists predominantly of mature segmented neutrophils. Review of the peripheral smear reveals adequate numbers of platelets. Anemia with RBCs which appear to macrocytic on smear review. Suggest evaluation for vitamin deficiency, if clinically indicated. Several Tear drop RBCs also noted. Suggest hematologic evaluation, if clinically indicated. Reviewed by Nehemiah Massed Mammarappallil, MD  (Electronic Signature on File)     03/07/2023   Iron, TIBC and Ferritin Panel     Status: None   Collection Time: 03/06/23  3:38 PM  Result Value Ref Range   Iron 141 50 - 180 mcg/dL   TIBC 272 536 - 644 mcg/dL (calc)   %SAT 36 20 - 48 % (calc)   Ferritin 84 24 - 380 ng/mL  LDL cholesterol, direct     Status: None   Collection Time: 03/06/23  3:38 PM  Result Value Ref Range   Direct LDL 85.0 mg/dL    Comment: Optimal:  <034 mg/dLNear or Above Optimal:  100-129 mg/dLBorderline High:  130-159 mg/dLHigh:  160-189 mg/dLVery High:  >190 mg/dL  CUP PACEART INCLINIC DEVICE CHECK     Status: None   Collection Time: 03/07/23  2:48 PM  Result Value Ref Range  Date Time Interrogation Session [id number]    Pulse Generator Manufacturer [facility]    Pulse Gen Model [id number] [place] [address]    Pulse Gen Serial Number [id number]    Clinic Name [facility]    Implantable Pulse Generator Type Implantable Cardiac Defibulator     Implantable Pulse Generator Implant Date [id number]    Implantable Lead Manufacturer OTHER    Implantable Lead Model [id number] [facility]    Implantable Lead Serial Number [id number]    Implantable Lead Implant Date [id number]    Implantable Lead Location Detail 1 UNKNOWN    Implantable Lead Location [id number]    Implantable Lead Connection Status 188416    Implantable Lead Manufacturer [facility]    Implantable Lead Model 7122 [address]    Implantable Lead Serial Number [id number]    Implantable Lead Implant Date 60630160    Implantable Lead Location Detail 1 UNKNOWN    Implantable Lead Location [id number]    Implantable Lead Connection Status [id number]    Lead Channel Setting Sensing Sensitivity 0.5 mV   Lead Channel Setting Pacing Amplitude 2.0 V   Lead Channel Setting Pacing Pulse Width 0.5 ms   Lead Channel Setting Pacing Amplitude 2.5 V   Zone Setting Status Active    Zone Setting Status Inactive    Zone Setting Status [phone]    Lead Channel Impedance Value 525.0 ohm   Lead Channel Sensing Intrinsic Amplitude 4.7 mV   Lead Channel Pacing Threshold Amplitude 0.75 V   Lead Channel Pacing Threshold Pulse Width 0.5 ms   Lead Channel Pacing Threshold Amplitude 0.75 V   Lead Channel Pacing Threshold Pulse Width 0.5 ms   Lead Channel Impedance Value 500.0 ohm   Lead Channel Sensing Intrinsic Amplitude 12.0 mV   Lead Channel Pacing Threshold Amplitude 0.75 V   Lead Channel Pacing Threshold Pulse Width 0.5 ms   Lead Channel Pacing Threshold Amplitude 0.75 V   Lead Channel Pacing Threshold Pulse Width 0.5 ms   HighPow Impedance 87.75 ohm   Battery Remaining Longevity 96 mo   Brady Statistic RA Percent Paced 86.0 %   Brady Statistic RV Percent Paced 4.3 %   Eval Rhythm AS/VS 66     No image results found.   CUP PACEART INCLINIC DEVICE CHECK  Result Date: 03/07/2023 ICD check in clinic. Normal device function. Thresholds and sensing consistent with previous device measurements. Impedance trends stable  over time. AT/AF burden <1%, 1 episode of AMS 6 SEC appears AT, controlled v rates. No ventricular arrhythmias. Histogram distribution appropriate for patient and level of activity.  Device programmed at appropriate safety margins. Device programmed to optimize intrinsic conduction. Estimated longevity 8 years. Pt enrolled in remote follow-up. Patient education completed including shock plan. Auditory/vibratory alert demonstrated.Syliva Overman, RN  CUP PACEART REMOTE DEVICE CHECK  Result Date: 02/13/2023 Scheduled remote reviewed. Normal device function.  1 AMS, 6sec in duration Next remote 91 days. LA, CVRS  CT Chest Wo Contrast  Result Date: 01/19/2023 CLINICAL DATA:  Tripped over curb.  Right-sided rib pain EXAM: CT CHEST WITHOUT CONTRAST TECHNIQUE: Multidetector CT imaging of the chest was performed following the standard protocol without IV contrast. RADIATION DOSE REDUCTION: This exam was performed according to the departmental dose-optimization program which includes automated exposure control, adjustment of the mA and/or kV according to patient size and/or use of iterative reconstruction technique. COMPARISON:  Chest radiograph 11/11/2022 and CT chest abdomen and pelvis 06/26/2022 FINDINGS: Cardiovascular: Normal heart size. No pericardial effusion. Left chest wall ICD. Coronary artery and  aortic atherosclerotic calcification. Mediastinum/Nodes: Unremarkable esophagus.  No thoracic adenopathy. Lungs/Pleura: No focal consolidation, pleural effusion, or pneumothorax. Mild scarring in the lingula. Central airways are patent. Upper Abdomen: No acute abnormality. Musculoskeletal: Remote right rib fractures. No acute rib fracture. Postoperative changes both shoulders. Unchanged mild superior endplate compression of T12 status post vertebroplasty. IMPRESSION: 1. No evidence of acute traumatic injury in the chest. Aortic Atherosclerosis (ICD10-I70.0). Electronically Signed   By: Minerva Fester M.D.   On:  01/19/2023 00:41   DG Hand Complete Right  Result Date: 01/19/2023 CLINICAL DATA:  Tripped over curb last night. Scraped knuckle on both hands. EXAM: RIGHT HAND - COMPLETE 3+ VIEW COMPARISON:  None Available. FINDINGS: Acute minimally displaced transverse fracture of the head of the fifth metacarpal. Mild soft tissue swelling about the knuckles. IMPRESSION: Acute minimally displaced fracture of the head of the fifth metacarpal. Electronically Signed   By: Minerva Fester M.D.   On: 01/19/2023 00:35   CT Head Wo Contrast  Result Date: 01/19/2023 CLINICAL DATA:  Fall, on Eliquis EXAM: CT HEAD WITHOUT CONTRAST TECHNIQUE: Contiguous axial images were obtained from the base of the skull through the vertex without intravenous contrast. RADIATION DOSE REDUCTION: This exam was performed according to the departmental dose-optimization program which includes automated exposure control, adjustment of the mA and/or kV according to patient size and/or use of iterative reconstruction technique. COMPARISON:  06/26/2022 FINDINGS: Brain: No evidence of acute infarction, hemorrhage, mass, mass effect, or midline shift. No hydrocephalus or extra-axial fluid collection. Mineralization in the bilateral basal ganglia. Vascular: No hyperdense vessel. Skull: Negative for fracture or focal lesion. Sinuses/Orbits: Minimal mucosal thickening in the maxillary sinuses and ethmoid air cells. No acute finding in the orbits. Other: The mastoid air cells are well aerated. IMPRESSION: No acute intracranial process. Electronically Signed   By: Wiliam Ke M.D.   On: 01/19/2023 00:34       This encounter employed real-time, collaborative documentation. The patient actively reviewed and updated their medical record on a shared screen, ensuring transparency and facilitating joint problem-solving for the problem list, overview, and plan. This approach promotes accurate, informed care. The treatment plan was discussed and reviewed in detail,  including medication safety, potential side effects, and all patient questions. We confirmed understanding and comfort with the plan. Follow-up instructions were established, including contacting the office for any concerns, returning if symptoms worsen, persist, or new symptoms develop, and precautions for potential emergency department visits. ----------------------------------------------------- Lula Olszewski, MD  03/08/2023 11:39 AM  [facility]:  [phone]

## 2023-03-07 ENCOUNTER — Ambulatory Visit: Payer: Commercial Managed Care - HMO | Attending: Cardiology | Admitting: Cardiology

## 2023-03-07 ENCOUNTER — Encounter: Payer: Self-pay | Admitting: Cardiology

## 2023-03-07 VITALS — BP 114/76 | HR 74 | Ht 71.0 in | Wt 274.0 lb

## 2023-03-07 DIAGNOSIS — I428 Other cardiomyopathies: Secondary | ICD-10-CM | POA: Diagnosis not present

## 2023-03-07 DIAGNOSIS — I480 Paroxysmal atrial fibrillation: Secondary | ICD-10-CM

## 2023-03-07 DIAGNOSIS — I472 Ventricular tachycardia, unspecified: Secondary | ICD-10-CM | POA: Diagnosis not present

## 2023-03-07 DIAGNOSIS — Z95810 Presence of automatic (implantable) cardiac defibrillator: Secondary | ICD-10-CM | POA: Diagnosis not present

## 2023-03-07 DIAGNOSIS — I5042 Chronic combined systolic (congestive) and diastolic (congestive) heart failure: Secondary | ICD-10-CM

## 2023-03-07 LAB — CUP PACEART INCLINIC DEVICE CHECK
Battery Remaining Longevity: 96 mo
Brady Statistic RA Percent Paced: 86 %
Brady Statistic RV Percent Paced: 4.3 %
Date Time Interrogation Session: 20240621144838
HighPow Impedance: 87.75 Ohm
Implantable Lead Connection Status: 753985
Implantable Lead Connection Status: 753985
Implantable Lead Implant Date: 20190821
Implantable Lead Implant Date: 20240226
Implantable Lead Location: 753859
Implantable Lead Location: 753860
Implantable Lead Model: 7122
Implantable Pulse Generator Implant Date: 20240226
Lead Channel Impedance Value: 500 Ohm
Lead Channel Impedance Value: 525 Ohm
Lead Channel Pacing Threshold Amplitude: 0.75 V
Lead Channel Pacing Threshold Amplitude: 0.75 V
Lead Channel Pacing Threshold Amplitude: 0.75 V
Lead Channel Pacing Threshold Amplitude: 0.75 V
Lead Channel Pacing Threshold Pulse Width: 0.5 ms
Lead Channel Pacing Threshold Pulse Width: 0.5 ms
Lead Channel Pacing Threshold Pulse Width: 0.5 ms
Lead Channel Pacing Threshold Pulse Width: 0.5 ms
Lead Channel Sensing Intrinsic Amplitude: 12 mV
Lead Channel Sensing Intrinsic Amplitude: 4.7 mV
Lead Channel Setting Pacing Amplitude: 2 V
Lead Channel Setting Pacing Amplitude: 2.5 V
Lead Channel Setting Pacing Pulse Width: 0.5 ms
Lead Channel Setting Sensing Sensitivity: 0.5 mV
Pulse Gen Serial Number: 211012451
Zone Setting Status: 755011

## 2023-03-07 LAB — COMPREHENSIVE METABOLIC PANEL
ALT: 27 U/L (ref 0–53)
AST: 20 U/L (ref 0–37)
Albumin: 4.3 g/dL (ref 3.5–5.2)
Alkaline Phosphatase: 69 U/L (ref 39–117)
BUN: 17 mg/dL (ref 6–23)
CO2: 30 mEq/L (ref 19–32)
Calcium: 9.6 mg/dL (ref 8.4–10.5)
Chloride: 98 mEq/L (ref 96–112)
Creatinine, Ser: 1.1 mg/dL (ref 0.40–1.50)
GFR: 72.34 mL/min (ref >60.00)
Glucose, Bld: 121 mg/dL — ABNORMAL HIGH (ref 70–99)
Potassium: 4.2 mEq/L (ref 3.5–5.1)
Sodium: 139 mEq/L (ref 135–145)
Total Bilirubin: 0.7 mg/dL (ref 0.2–1.2)
Total Protein: 7.1 g/dL (ref 6.0–8.3)

## 2023-03-07 LAB — CBC WITH DIFFERENTIAL/PLATELET
Basophils Absolute: 0.1 10*3/uL (ref 0.0–0.1)
Basophils Relative: 0.7 % (ref 0.0–3.0)
Eosinophils Absolute: 0.2 10*3/uL (ref 0.0–0.7)
Eosinophils Relative: 1.9 % (ref 0.0–5.0)
HCT: 39.9 % (ref 39.0–52.0)
Hemoglobin: 12.9 g/dL — ABNORMAL LOW (ref 13.0–17.0)
Lymphocytes Relative: 15.3 % (ref 12.0–46.0)
Lymphs Abs: 1.3 10*3/uL (ref 0.7–4.0)
MCHC: 32.3 g/dL (ref 30.0–36.0)
MCV: 100.7 fl — ABNORMAL HIGH (ref 78.0–100.0)
Monocytes Absolute: 0.9 10*3/uL (ref 0.1–1.0)
Monocytes Relative: 10.5 % (ref 3.0–12.0)
Neutro Abs: 6.1 10*3/uL (ref 1.4–7.7)
Neutrophils Relative %: 71.6 % (ref 43.0–77.0)
Platelets: 261 10*3/uL (ref 150.0–400.0)
RBC: 3.96 Mil/uL — ABNORMAL LOW (ref 4.22–5.81)
RDW: 13.8 % (ref 11.5–15.5)
WBC: 8.5 10*3/uL (ref 4.0–10.5)

## 2023-03-07 LAB — LIPID PANEL
Cholesterol: 170 mg/dL (ref 0–200)
HDL: 48.7 mg/dL (ref >39.00)
NonHDL: 121.09
Total CHOL/HDL Ratio: 3
Triglycerides: 315 mg/dL — ABNORMAL HIGH (ref 0.0–149.0)
VLDL: 63 mg/dL — ABNORMAL HIGH (ref 0.0–40.0)

## 2023-03-07 LAB — MAGNESIUM: Magnesium: 2.2 mg/dL (ref 1.5–2.5)

## 2023-03-07 LAB — LDL CHOLESTEROL, DIRECT: Direct LDL: 85 mg/dL

## 2023-03-07 LAB — BRAIN NATRIURETIC PEPTIDE: Pro B Natriuretic peptide (BNP): 221 pg/mL — ABNORMAL HIGH (ref 0.0–100.0)

## 2023-03-07 LAB — VITAMIN B12: Vitamin B-12: 297 pg/mL (ref 211–911)

## 2023-03-07 LAB — PATHOLOGIST SMEAR REVIEW

## 2023-03-07 LAB — PHOSPHORUS: Phosphorus: 4 mg/dL (ref 2.3–4.6)

## 2023-03-07 NOTE — Patient Instructions (Signed)
Medication Instructions:  Your physician recommends that you continue on your current medications as directed. Please refer to the Current Medication list given to you today.  *If you need a refill on your cardiac medications before your next appointment, please call your pharmacy*  Follow-Up: At [facility], you and your health needs are our priority.  As part of our continuing mission to provide you with exceptional heart care, we have created designated Provider Care Teams.  These Care Teams include your primary Cardiologist (physician) and Advanced Practice Providers (APPs -  Physician Assistants and Nurse Practitioners) who all work together to provide you with the care you need, when you need it.  Your next appointment:   As needed with Dr. Lambert 

## 2023-03-07 NOTE — Progress Notes (Signed)
Electrophysiology Office Follow up Visit Note:    Date:  03/07/2023   ID:  John [name], DOB Feb 01, 1961, MRN [id number]  PCP:  Shelva Majestic, MD  [facility] Cardiologist:  Marca Ancona, MD  [facility] Electrophysiologist:  None    Interval History:    John [name] is a 62 y.o. male who presents for a follow up visit.   Last seen by Dr. Royann Shivers December 09, 2022.  On November 11, 2022 a right atrial lead was added to his CIED and his generator replaced.  He has done well after that procedure.  The wound is healed well.  He also has a history of persistent atrial fibrillation with a CHA2DS2-VASc of 4.  Today he tells me that the incision healed well but the lateral margin of his generator is more prominent than his prior device.    Past medical, surgical, social and family history were reviewed.  ROS:   Please see the history of present illness.    All other systems reviewed and are negative.  EKGs/Labs/Other Studies Reviewed:    The following studies were reviewed today:  March 07, 2023 in clinic device interrogation personally reviewed .  Longevity 8 years Lead parameter stable Decreased atrial pulse amplitude today for chronic settings, atrially pacing 86% No high-voltage therapies  EKG Interpretation  Date/Time:  Friday March 07 2023 13:57:06 EDT Ventricular Rate:  78 PR Interval:  152 QRS Duration: 92 QT Interval:  388 QTC Calculation: 442 R Axis:   -1 Text Interpretation: Normal sinus rhythm Low voltage QRS Nonspecific ST and T wave abnormality Confirmed by Steffanie Dunn [phone]) on 03/07/2023 2:19:09 PM     Physical Exam:    VS:  BP 114/76   Pulse 74   Ht 5\' 11"  (1.803 m)   Wt 274 lb (124.3 kg)   SpO2 96%   BMI 38.22 kg/m     Wt Readings from Last 3 Encounters:  03/07/23 274 lb (124.3 kg)  03/06/23 269 lb 12.8 oz (122.4 kg)  02/27/23 277 lb 9.6 oz (125.9 kg)     GEN:  Well nourished, well developed in no acute distress CARDIAC:  RRR, no murmurs, rubs, gallops.  ICD pocket well-healed.  The lateral margin of the generator appears to be closer to the skin surface compared to the medial margin.  The skin is intact and is not adherent to the generator.  No signs of pocket infection.  The generator is freely mobile in the pocket. RESPIRATORY:  Clear to auscultation without rales, wheezing or rhonchi       ASSESSMENT:    1. VT (ventricular tachycardia) (HCC)   2. Non-ischemic cardiomyopathy (HCC)   3. Chronic combined systolic (congestive) and diastolic (congestive) heart failure (HCC)   4. ICD (implantable cardioverter-defibrillator) in place   5. Paroxysmal atrial fibrillation (HCC)    PLAN:    In order of problems listed above:  #Chronic systolic heart failure #Nonischemic cardiomyopathy #Ventricular tachycardia NYHA class II.  Warm and dry on exam.  On good medical therapy.  Last EF in May 2020 2330 to 35%. No recurrence of VT after overdrive pacing from the atrium.  Continue Coreg.  #ICD in situ Device functioning appropriately.  Continue remote monitoring.  #Paroxysmal atrial fibrillation On Eliquis for stroke prophylaxis  Follow-up with me on an as-needed basis.  Patient has routine follow-up for his device with Dr. Royann Shivers.    Signed, Steffanie Dunn, MD, [facility], [facility] 03/07/2023 2:19 PM    Electrophysiology [facility]

## 2023-03-08 DIAGNOSIS — I2510 Atherosclerotic heart disease of native coronary artery without angina pectoris: Secondary | ICD-10-CM | POA: Insufficient documentation

## 2023-03-08 DIAGNOSIS — Z4502 Encounter for adjustment and management of automatic implantable cardiac defibrillator: Secondary | ICD-10-CM | POA: Insufficient documentation

## 2023-03-08 NOTE — Patient Instructions (Addendum)
VISIT SUMMARY:  During our appointment, we discussed your recent cardiac events, difficulty in obtaining your prescribed medication, Wegovy, due to insurance issues, and your desire to lose weight and increase physical activity. We also talked about your history of falls and your lifestyle changes, including a reduction in alcohol consumption and the addition of a multivitamin to your daily regimen.  YOUR PLAN:  -CARDIOVASCULAR DISEASE: You have a history of heart disease, including a recent heart attack. We discussed the benefits of Wegovy, a medication that can help reduce the risk of further heart problems and assist with weight loss. We will try to get this medication through your pharmacy and insurance. If this is not successful, we may need to discuss this with your insurance company directly. It might be necessary to switch insurance if your current plan does not cover Wegovy.  -ALCOHOL USE: Regular drinking can affect your vitamin B12 levels and heart health. We plan to reduce your alcohol intake and start you on over-the-counter vitamin B12 supplements.  -GENERAL HEALTH MAINTENANCE: Your previous tests showed some abnormalities, and you've reported shortness of breath, which could be due to your weight and heart failure. We will order comprehensive blood work to check your current health status and watch for potential complications. We encourage you to continue your efforts to lose weight and exercise regularly.  INSTRUCTIONS:  You have a follow-up appointment with Dr. Durene Cal scheduled for March 14, 2023, to review your lab results and continue managing your health. Please continue to take your medications as prescribed, reduce your alcohol intake, and take a vitamin B12 supplement. Keep up with your efforts to lose weight and exercise regularly.  Talk with pharmacy about whether we can help with prior authorize paperwork, peer-to-peer, or savings cards for Reg[facility] Lutes  It was a pleasure seeing  you today! Your health and satisfaction are our top priorities.   [name], MD  Next Steps:  [x]  Flexible Follow-Up: We recommend a follow up with your primary care, a specialist, or me within 1-2 weeks for ensuring your problem resolves. This allows for progress monitoring and treatment adjustments. [x]  Early Intervention: Schedule sooner appointment, call our on-call services, or go to emergency room if there is Increase in pain or discomfort New or worsening symptoms Sudden or severe changes in your health [x]  Lab & X-ray Appointments: complete or schedule to complete today, or call to schedule.  X-rays: [facility] (M-F, 8:30am-noon or 1pm-5pm).  Making the Most of Our Focused (20 minute) Follow Up Appointments:  [x]   Clearly state your top concerns at the beginning of the visit to focus our discussion [x]   If you anticipate you will need more time, please inform the front desk during scheduling - we can book multiple appointments in the same week. [x]   If you have transportation problems- use our convenient video appointments or ask about transportation support. [x]   We can get down to business faster if you use MyChart to update information before the visit and submit non-urgent questions before your visit. Thank you for taking the time to provide details through MyChart.  Let our nurse know and she can import this information into your encounter documents.  Arrival and Wait Times: [x]   Arriving on time ensures that everyone receives prompt attention. [x]   Early morning (8a) and afternoon (1p) appointments tend to have shortest wait times. [x]   Unfortunately, we cannot delay appointments for late arrivals or hold slots during phone calls.  Getting Answers and Following  Up  [x]   Simple Questions & Concerns: For quick questions or basic follow-up after your visit, reach [place] at [phone] or MyChart messaging. [x]   Complex Concerns: If your concern is more  complex, scheduling an appointment might be best. Discuss this with the staff to find the most suitable option. [x]   Lab & Imaging Results: We'll contact you directly if results are abnormal or you don't use MyChart. Most normal results will be on MyChart within 2-3 business days, with a review message from Dr. Jon Billings. Haven't heard back in 2 weeks? Need results sooner? Contact us at [phone]. [x]   Referrals: Our referral coordinator will manage specialist referrals. The specialist's office should contact you within 2 weeks to schedule an appointment. Call us if you haven't heard from them after 2 weeks.  Staying Connected  [x]   MyChart: Activate your MyChart for the fastest way to access results and message [place]. See the last page of this paperwork for instructions on how to activate.  Bring to Your Next Appointment  [x]   Medications: Please bring all your medication bottles to your next appointment to ensure we have an accurate record of your prescriptions. [x]   Health Diaries: If you're monitoring any health conditions at home, keeping a diary of your readings can be very helpful for discussions at your next appointment.  Billing  [x]   X-ray & Lab Orders: These are billed by separate companies. Contact the invoicing company directly for questions or concerns. [x]   Visit Charges: Discuss any billing inquiries with our administrative services team.  Your Satisfaction Matters  [x]   Share Your Experience: We strive for your satisfaction! If you have any complaints, or preferably compliments, please let Dr. Jon Billings know directly or contact our Practice Administrators, Edwena Felty or [employer], by asking at the front desk.   Reviewing Your Records  [x]   Review this early draft of your clinical encounter notes below and the final encounter summary tomorrow on MyChart after its been completed.   History of non-ST elevation myocardial infarction (NSTEMI) Assessment &  Plan: Cardiovascular Disease: Has history of myocardial infarction in February 2024 and subsequent AICD placement, with catheter confirmed CAD we discussed the benefits of Wegovy for cardiac risk reduction and weight loss and its recent FDA approval. We discussed why/how we were not able to get it approved on prior submission. We will attempt to obtain [facility] through pharmacy and insurance again, with increased documentation and if unsuccessful, a peer-to-peer discussion with insurance will be considered. Switching insurance may be necessary if the current plan does not cover Wegovy despite these efforts.  Given his severe CAD and morbid obesity without diabetes, it is one of the most beneficial medication(s) he could take.  Orders: -     Semaglutide-Weight Management; Inject 0.25 mg into the skin once a week for 28 days. History of myocardial infarction 10/2022.  Has coexistent CHF ejection fraction 20%.  Has AICD.  Needs cardiac benefit of Wegovy.  Does not have Diabetes.  FDA has approved [facility] for cardiac risk reduction and medicare covers it for patients in this situation.  Willing to do peer 2 peer or prior authorize. (Patient not taking: Reported on 03/07/2023)  Dispense: 2 mL; Refill: 0 -     Semaglutide-Weight Management; Inject 0.5 mg into the skin once a week for 28 days. (Patient not taking: Reported on 03/07/2023)  Dispense: 2 mL; Refill: 0 -     Semaglutide-Weight Management; Inject 1 mg into the skin once a week  for 28 days. (Patient not taking: Reported on 03/07/2023)  Dispense: 2 mL; Refill: 0 -     Semaglutide-Weight Management; Inject 1.7 mg into the skin once a week for 28 days. (Patient not taking: Reported on 03/07/2023)  Dispense: 3 mL; Refill: 0 -     Semaglutide-Weight Management; Inject 2.4 mg into the skin once a week for 28 days. (Patient not taking: Reported on 03/07/2023)  Dispense: 3 mL; Refill: 0  Chronic systolic congestive heart failure (HCC) -     Brain natriuretic peptide -      CBC with Differential/Platelet -     Comprehensive metabolic panel -     Methylmalonic acid, serum -     Vitamin B12 -     Lipid panel -     Magnesium  Iron deficiency -     Iron, TIBC and Ferritin Panel  Macrocytosis -     B-12; Take 1 tablet by mouth daily at 6 (six) AM.  Dispense: 90 capsule; Refill: 3 -     Vitamin B12 -     Reticulocytes; Future -     Pathologist smear review -     Protein electrophoresis, serum  Serum phosphate elevated -     Phosphorus  Coronary artery disease involving native coronary artery of native heart without angina pectoris  AICD discharge  Morbid obesity due to excess calories [facility])  Healthcare maintenance  Alcohol dependence in remission [facility]) Assessment & Plan: Persistent issue. I related this to his CHF and need to stop.  Also his elevated MCV and need to supplement vitamins.  Possibly part of nonischemic cardiomyopathy, but he also has coronary artery disease confirmed We will reduce alcohol intake and start over-the-counter vitamin B12 supplementation.now.   Other orders -     LDL cholesterol, direct

## 2023-03-08 NOTE — Assessment & Plan Note (Signed)
Cardiovascular Disease: Has history of myocardial infarction in February 2024 and subsequent AICD placement, with catheter confirmed CAD we discussed the benefits of Wegovy for cardiac risk reduction and weight loss and its recent FDA approval. We discussed why/how we were not able to get it approved on prior submission. We will attempt to obtain [facility] through pharmacy and insurance again, with increased documentation and if unsuccessful, a peer-to-peer discussion with insurance will be considered. Switching insurance may be necessary if the current plan does not cover Wegovy despite these efforts.  Given his severe CAD and morbid obesity without diabetes, it is one of the most beneficial medication(s) he could take.

## 2023-03-08 NOTE — Assessment & Plan Note (Signed)
Persistent issue. I related this to his CHF and need to stop.  Also his elevated MCV and need to supplement vitamins.  Possibly part of nonischemic cardiomyopathy, but he also has coronary artery disease confirmed We will reduce alcohol intake and start over-the-counter vitamin B12 supplementation.now.

## 2023-03-10 ENCOUNTER — Encounter: Payer: Self-pay | Admitting: Internal Medicine

## 2023-03-10 ENCOUNTER — Ambulatory Visit: Payer: Commercial Managed Care - HMO | Admitting: Podiatry

## 2023-03-10 DIAGNOSIS — D7589 Other specified diseases of blood and blood-forming organs: Secondary | ICD-10-CM | POA: Insufficient documentation

## 2023-03-10 LAB — PROTEIN ELECTROPHORESIS, SERUM
Albumin ELP: 4 g/dL (ref 3.8–4.8)
Alpha 1: 0.3 g/dL (ref 0.2–0.3)
Alpha 2: 0.9 g/dL (ref 0.5–0.9)
Beta 2: 0.4 g/dL (ref 0.2–0.5)
Beta Globulin: 0.6 g/dL (ref 0.4–0.6)
Gamma Globulin: 0.7 g/dL — ABNORMAL LOW (ref 0.8–1.7)
Total Protein: 6.9 g/dL (ref 6.1–8.1)

## 2023-03-10 LAB — METHYLMALONIC ACID, SERUM: Methylmalonic Acid, Quant: 301 nmol/L (ref 69–390)

## 2023-03-10 LAB — IRON,TIBC AND FERRITIN PANEL
%SAT: 36 % (calc) (ref 20–48)
Ferritin: 84 ng/mL (ref 24–380)
Iron: 141 ug/dL (ref 50–180)
TIBC: 391 mcg/dL (calc) (ref 250–425)

## 2023-03-10 NOTE — Progress Notes (Signed)
Sharing artificial intelligence analysis from MyChart for patients macrocytosis- it thinks he needs hematology but I'm not so sure.  Defer to your collaborative shared decision making with patient at your upcoming appointment on 03/12/23

## 2023-03-11 ENCOUNTER — Telehealth: Payer: Self-pay | Admitting: Family Medicine

## 2023-03-11 NOTE — Telephone Encounter (Signed)
Called and spoke with pt and pt states that Dr. Jon Billings told pt he was going to do a peer to peer with insurance to get [facility] approved and was told to call back this week to check on this as pt is still waiting to here from someone regarding this. Please f/u with pt on this/status of if Dr. Jon Billings has done this yet.

## 2023-03-11 NOTE — Telephone Encounter (Signed)
Pt would like a call back concerning the PA for [facility]

## 2023-03-12 NOTE — Telephone Encounter (Signed)
Pt states his insurance company has not heard of anybody from this office sending them an RX for [employer]. Pt is getting very upset because it should not be this hard to get an approval. Please advise.

## 2023-03-14 ENCOUNTER — Ambulatory Visit (INDEPENDENT_AMBULATORY_CARE_PROVIDER_SITE_OTHER): Payer: Commercial Managed Care - HMO | Admitting: Family Medicine

## 2023-03-14 ENCOUNTER — Encounter: Payer: Self-pay | Admitting: Family Medicine

## 2023-03-14 ENCOUNTER — Telehealth: Payer: Self-pay

## 2023-03-14 ENCOUNTER — Other Ambulatory Visit (HOSPITAL_COMMUNITY): Payer: Self-pay

## 2023-03-14 VITALS — BP 120/70 | HR 73 | Temp 97.7°F | Ht 71.0 in | Wt 270.8 lb

## 2023-03-14 DIAGNOSIS — Z0000 Encounter for general adult medical examination without abnormal findings: Secondary | ICD-10-CM

## 2023-03-14 DIAGNOSIS — F1021 Alcohol dependence, in remission: Secondary | ICD-10-CM | POA: Diagnosis not present

## 2023-03-14 DIAGNOSIS — Z1211 Encounter for screening for malignant neoplasm of colon: Secondary | ICD-10-CM | POA: Diagnosis not present

## 2023-03-14 DIAGNOSIS — I700 Atherosclerosis of aorta: Secondary | ICD-10-CM

## 2023-03-14 DIAGNOSIS — Z125 Encounter for screening for malignant neoplasm of prostate: Secondary | ICD-10-CM

## 2023-03-14 MED ORDER — TRAZODONE HCL 50 MG PO TABS
50.0000 mg | ORAL_TABLET | Freq: Every day | ORAL | 3 refills | Status: DC
Start: 2023-03-14 — End: 2024-04-05

## 2023-03-14 NOTE — Progress Notes (Signed)
Phone: [phone]   Subjective:  Patient presents today for their annual physical. Chief complaint-noted.   See problem oriented charting- ROS- full  review of systems was completed and negative  except for: difficulty losing weight, some fatigue with poor sleep, skin lesion on left side of head not healing  The following were reviewed and entered/updated in epic: Past Medical History:  Diagnosis Date   AICD (automatic cardioverter/defibrillator) present    [employer]. [employer]   Anxiety    Ativan   Chronic combined systolic and diastolic CHF, NYHA class 3 (HCC) 10/2016   Nonischemic cardiomyopathy. EF 20-25%.   Chronic left hip pain    Depression    history of   Dysrhythmia    A-FIB   GI bleed 03/2016   Headache    Hypertensive heart disease with combined systolic and diastolic congestive heart failure (HCC) 10/2016   Nonischemic cardiomyopathy (HCC) 10/2016   Echo with EF 20-25%. Cardiac catheterization with no CAD. LVEDP was 41 mmHg, PCWP 36 mmHg   Osteoarthritis    right shoulder   Right hand fracture    Seasonal allergies    Sleep apnea    wears a CPAP   Wears glasses    Patient Active Problem List   Diagnosis Date Noted   Hyponatremia 02/16/2021    Priority: High   Paroxysmal atrial fibrillation (HCC) 04/14/2020    Priority: High   S/P ICD (internal cardiac defibrillator) procedure 04/14/2020    Priority: High   Status post reverse total shoulder replacement, left 11/11/2019    Priority: High   Non-ischemic cardiomyopathy (HCC)     Priority: High   Chronic combined systolic and diastolic CHF, NYHA class 3 (HCC) 10/2016    Priority: High   Alcohol dependence in remission (HCC) 03/19/2016    Priority: High   Aortic atherosclerosis (HCC) 03/22/2021    Priority: Medium    GAD (generalized anxiety disorder) 05/31/2020    Priority: Medium    Major depression, recurrent, full remission (HCC) 04/15/2019    Priority: Medium    Hyperlipidemia 05/27/2018    Priority:  Medium    Essential hypertension 11/08/2016    Priority: Medium    GI bleed 03/19/2016    Priority: Medium    ED (erectile dysfunction) 08/21/2015    Priority: Medium    Hyperglycemia 08/21/2015    Priority: Medium    Insomnia 08/21/2015    Priority: Medium    OSA (obstructive sleep apnea) 08/21/2015    Priority: Medium    Retained orthopedic hardware 11/20/2018    Priority: Low   Unspecified fracture of upper end of left humerus, initial encounter for closed fracture 09/03/2017    Priority: Low   S/P shoulder replacement 11/24/2015    Priority: Low   Allergic rhinitis 08/21/2015    Priority: Low   Degenerative joint disease of left hip 03/27/2015    Priority: Low   Closed fracture of distal end of left radius 02/01/2022    Priority: 1.   Lumbar spondylosis 01/16/2022    Priority: 1.   Osteoarthritis of right hip 05/03/2021    Priority: 1.   Osteoarthritis of left hip 05/03/2021    Priority: 1.   Thoracic compression fracture (HCC) 02/22/2021    Priority: 1.   Avascular necrosis of left humeral head (HCC) 11/20/2018    Priority: 1.   Left shoulder pain 11/04/2018    Priority: 1.   Macrocytosis without anemia 03/10/2023   CAD (coronary artery disease) 03/08/2023   AICD discharge  03/08/2023   History of non-ST elevation myocardial infarction (NSTEMI) 02/18/2023   Other fatigue 10/17/2022   Morbid obesity due to excess calories (HCC) 10/17/2022   VT (ventricular tachycardia) (HCC) 08/28/2022   CHF (congestive heart failure) (HCC) 08/28/2022   Mild episode of recurrent major depressive disorder (HCC) 04/15/2019   Past Surgical History:  Procedure Laterality Date   CARDIAC CATHETERIZATION     CARDIOVERSION N/A 09/01/2019   Procedure: CARDIOVERSION;  Surgeon: Laurey Morale, MD;  Location: [facility] ENDOSCOPY;  Service: Cardiovascular;  Laterality: N/A;   CARDIOVERSION N/A 10/07/2022   Procedure: CARDIOVERSION;  Surgeon: Thomasene Ripple, DO;  Location: [facility] ENDOSCOPY;  Service:  Cardiovascular;  Laterality: N/A;   ESOPHAGOGASTRODUODENOSCOPY (EGD) WITH PROPOFOL N/A 03/20/2016   Procedure: ESOPHAGOGASTRODUODENOSCOPY (EGD) WITH PROPOFOL;  Surgeon: Charlott Rakes, MD;  Location: [facility] ENDOSCOPY;  Service: Endoscopy;  Laterality: N/A;   ICD IMPLANT N/A 05/06/2018   Procedure: ICD IMPLANT;  Surgeon: Thurmon Fair, MD;  Location: [facility] INVASIVE CV LAB;  Service: Cardiovascular;  Laterality: N/A;   IRRIGATION AND DEBRIDEMENT SHOULDER Left 11/11/2019   Procedure: LEFT SHOULDER IRRIGATION AND DEBRIDEMENT WITH POLY EXCHANGE;  Surgeon: Jones Broom, MD;  Location: [facility] ORS;  Service: Orthopedics;  Laterality: Left;   KYPHOPLASTY N/A 02/22/2021   Procedure: KYPHOPLASTY T12;  Surgeon: Venita Lick, MD;  Location: [facility] OR;  Service: Orthopedics;  Laterality: N/A;  90 mins   LEAD REVISION/REPAIR N/A 11/11/2022   Procedure: LEAD atrial lead insertion;  Surgeon: Lanier Prude, MD;  Location: [facility] INVASIVE CV LAB;  Service: Cardiovascular;  Laterality: N/A;   ORIF WRIST FRACTURE Left 02/04/2022   Procedure: OPEN REDUCTION INTERNAL FIXATION (ORIF) WRIST FRACTURE;  Surgeon: Bradly Bienenstock, MD;  Location: [facility] OR;  Service: Orthopedics;  Laterality: Left;  with IV sedation   PPM GENERATOR CHANGEOUT N/A 11/11/2022   Procedure: PPM GENERATOR CHANGEOUT;  Surgeon: Lanier Prude, MD;  Location: [facility] INVASIVE CV LAB;  Service: Cardiovascular;  Laterality: N/A;   RIGHT/LEFT HEART CATH AND CORONARY ANGIOGRAPHY N/A 10/21/2016   Procedure: Right/Left Heart Cath and Coronary Angiography;  Surgeon: Runell Gess, MD;  Location: [facility] INVASIVE CV LAB;  Service: Cardiovascular: Angiographically normal coronary arteries. PCWP 33-36 mmHg, LVEDP 41 mmHg. PA pressure 60/35, mean 46 mmHg.  Cardiac output/cardiac index-3.93 /1.76 Hiram Comber), 3.49/1.57 (thermodilution)   TEE WITHOUT CARDIOVERSION N/A 09/01/2019   Procedure: TRANSESOPHAGEAL ECHOCARDIOGRAM (TEE);  Surgeon: Laurey Morale, MD;  Location: [facility] ENDOSCOPY;  Service:  Cardiovascular;  Laterality: N/A;   TOTAL HIP ARTHROPLASTY Left 03/27/2015   Procedure: LEFT TOTAL HIP ARTHROPLASTY ANTERIOR APPROACH;  Surgeon: Samson Frederic, MD;  Location: [facility] OR;  Service: Orthopedics;  Laterality: Left;   TOTAL HIP ARTHROPLASTY Right 05/03/2021   Procedure: TOTAL HIP ARTHROPLASTY ANTERIOR APPROACH;  Surgeon: Samson Frederic, MD;  Location: [facility] ORS;  Service: Orthopedics;  Laterality: Right;   TOTAL SHOULDER ARTHROPLASTY Right 11/24/2015   Procedure: RIGHT TOTAL SHOULDER ARTHROPLASTY;  Surgeon: Beverely Low, MD;  Location: [facility] OR;  Service: Orthopedics;  Laterality: Right;   TOTAL SHOULDER ARTHROPLASTY Left 10/19/2019   Procedure: REVERSE TOTAL SHOULDER ARTHROPLASTY;  Surgeon: Jones Broom, MD;  Location: [facility] ORS;  Service: Orthopedics;  Laterality: Left;   TRANSTHORACIC ECHOCARDIOGRAM  10/2016   EF 20-25%. Diffuse hypokinesis but akinesis of the entire inferoseptal wall and apical wall. Moderate biatrial enlargement. PA pressure estimated 64 mmHg.   WISDOM TOOTH EXTRACTION      Family History  Problem Relation Age of Onset   Dementia Mother  died at 54   Lung cancer Father        smoker   Congenital heart disease Sister        lived to 75    Healthy Brother    Healthy Sister    Prostate cancer Brother        possible cancer   Healthy Brother    Healthy Brother     Medications- reviewed and updated Current Outpatient Medications  Medication Sig Dispense Refill   apixaban (ELIQUIS) 5 MG TABS tablet Take 1 tablet (5 mg total) by mouth 2 (two) times daily. Restart 3/3 with the am dose. 180 tablet 3   carvedilol (COREG) 25 MG tablet Take 0.5 tablets (12.5 mg total) by mouth every morning AND 1 tablet (25 mg total) every evening. 135 tablet 3   Cyanocobalamin (B-12) 1000 MCG CAPS Take 1 tablet by mouth daily at 6 (six) AM. 90 capsule 3   ENTRESTO 97-103 MG TAKE 1 TABLET BY MOUTH TWICE A DAY 60 tablet 5   famotidine-calcium carbonate-magnesium hydroxide (PEPCID  COMPLETE) 10-800-165 MG chewable tablet Chew 1 tablet by mouth 2 (two) times daily as needed. 100 tablet 11   FARXIGA 10 MG TABS tablet TAKE 1 TABLET BY MOUTH EVERY DAY BEFORE BREAKFAST 90 tablet 3   Multiple Vitamin (MULTIVITAMIN WITH MINERALS) TABS tablet Take 1 tablet by mouth in the morning.     rosuvastatin (CRESTOR) 20 MG tablet TAKE 1 TABLET BY MOUTH EVERY DAY 90 tablet 3   Semaglutide-Weight Management 0.25 MG/0.5ML SOAJ Inject 0.25 mg into the skin once a week for 28 days. 2 mL 0   Semaglutide-Weight Management 0.25 MG/0.5ML SOAJ Inject 0.25 mg into the skin once a week for 28 days. History of myocardial infarction 10/2022.  Has coexistent CHF ejection fraction 20%.  Has AICD.  Needs cardiac benefit of Wegovy.  Does not have Diabetes.  FDA has approved [facility] for cardiac risk reduction and medicare covers it for patients in this situation.  Willing to do peer 2 peer or prior authorize. 2 mL 0   [START ON 03/28/2023] Semaglutide-Weight Management 0.5 MG/0.5ML SOAJ Inject 0.5 mg into the skin once a week for 28 days. 2 mL 0   [START ON 04/04/2023] Semaglutide-Weight Management 0.5 MG/0.5ML SOAJ Inject 0.5 mg into the skin once a week for 28 days. 2 mL 0   [START ON 04/26/2023] Semaglutide-Weight Management 1 MG/0.5ML SOAJ Inject 1 mg into the skin once a week for 28 days. 2 mL 0   [START ON 05/03/2023] Semaglutide-Weight Management 1 MG/0.5ML SOAJ Inject 1 mg into the skin once a week for 28 days. 2 mL 0   [START ON 05/25/2023] Semaglutide-Weight Management 1.7 MG/0.75ML SOAJ Inject 1.7 mg into the skin once a week for 28 days. 3 mL 0   [START ON 06/01/2023] Semaglutide-Weight Management 1.7 MG/0.75ML SOAJ Inject 1.7 mg into the skin once a week for 28 days. 3 mL 0   [START ON 06/23/2023] Semaglutide-Weight Management 2.4 MG/0.75ML SOAJ Inject 2.4 mg into the skin once a week for 28 days. 3 mL 0   [START ON 06/30/2023] Semaglutide-Weight Management 2.4 MG/0.75ML SOAJ Inject 2.4 mg into the skin once a  week for 28 days. 3 mL 0   sildenafil (VIAGRA) 100 MG tablet TAKE 1 TABLET (100 MG TOTAL) BY MOUTH AT BEDTIME AS NEEDED FOR ERECTILE DYSFUNCTION. 90 tablet 3   spironolactone (ALDACTONE) 50 MG tablet TAKE 1 TABLET BY MOUTH EVERY DAY (Patient taking differently: Take 25 mg  by mouth daily.) 90 tablet 3   torsemide (DEMADEX) 20 MG tablet Take 2 tablets (40 mg total) by mouth daily. 180 tablet 2   traZODone (DESYREL) 50 MG tablet Take 1 tablet (50 mg total) by mouth at bedtime. 90 tablet 3   No current facility-administered medications for this visit.    Allergies-reviewed and updated Allergies  Allergen Reactions   Chlorhexidine Itching   Cymbalta [Duloxetine Hcl] Nausea Only and Other (See Comments)    Night sweats     Hydrocodone Rash    Social History   Social History Narrative   Engaged 2022- long term GF/lives with him. 2 dogs. Close with 3 sons that work for wife- christopher 15 at [employer], [place] 21, [place] 25 in 2021.       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    G[place] up [place].    Objective  Objective:  BP 120/70   Pulse 73   Temp 97.7 F (36.5 C)   Ht 5\' 11"  (1.803 m)   Wt 270 lb 12.8 oz (122.8 kg)   SpO2 97%   BMI 37.77 kg/m  Gen: NAD, resting comfortably HEENT: Mucous membranes are moist. Oropharynx normal Neck: no thyromegaly CV: RRR no murmurs rubs or gallops Lungs: CTAB no crackles, wheeze, rhonchi Abdomen: soft/nontender/nondistended/normal bowel sounds. No rebound or guarding.  Ext: no edema Skin: warm, dry Neuro: grossly normal, moves all extremities, PERRLA    Assessment and Plan  62 y.o. male presenting for annual physical.  Health Maintenance counseling: 1. Anticipatory guidance: Patient counseled regarding regular dental exams -q6 months, eye exams -yearly,  avoiding smoking and second hand smoke , limiting alcohol to 2 beverages per day - reports  stopping beer 2 weeks ago- encouraged long term cessation - one of best hings he can do for his health, no illicit drugs .   2. Risk factor reduction:  Advised patient of need for regular exercise and diet rich and fruits and vegetables to reduce risk of heart attack and stroke.  Exercise- stationary bike but bothers his back.  Diet/weight management-has seen healthy weight to wellness in past but did not have good experience- felt like very focused on $ to him and didn't returnafter February visit. Really wants to get on Wegovy- has cut out alcohol to try to help with weight loss and reducing pasta and doing more fruits/veggies- recommend myfitnesspal .  Wt Readings from Last 3 Encounters:  03/14/23 270 lb 12.8 oz (122.8 kg)  03/07/23 274 lb (124.3 kg)  03/06/23 269 lb 12.8 oz (122.4 kg)  3. Immunizations/screenings/ancillary studies-recommend Shingrix, discussed COVID vaccination- consider in the fal when new one released Immunization History  Administered Date(s) Administered   Influenza, Quadrivalent, Recombinant, Inj, Pf 08/05/2017   Influenza,inj,Quad PF,6+ Mos 10/19/2016, 09/03/2021   Influenza-Unspecified 06/14/2015, 10/19/2016, 05/19/2020   PFIZER(Purple Top)SARS-COV-2 Vaccination 03/30/2020, 04/30/2020, 05/19/2020   PNEUMOCOCCAL CONJUGATE-20 09/03/2021   Pneumococcal Polysaccharide-23 10/19/2016   Td 01/29/2007   Td (Adult), 2 Lf Tetanus Toxid, Preservative Free 01/29/2007   Tdap 04/14/2020, 01/19/2023  4. Prostate cancer screening- screen PSA with labs next week- high normal value in the past . Declines rectal exam Lab Results  Component Value Date   PSA 3.40 08/24/2020   5. Colon cancer screening - no family or personal history of polyps or cancer- wants to try cologuard 6. Skin cancer screening- had seen [place] derm in past-  wants to return- had lesion that didn't  resolve after cryotherapy advised regular sunscreen use. Denies worrisome, changing, or new skin lesions-  other than cryo treatemnt 7. Smoking associated screening (lung cancer screening, AAA screen 65-75, UA)- never smoker 8. STD screening - only active with wife  Status of chronic or acute concerns   # Social update-patient got married January 17! Enjoying marriage   #CHF systolic with 25% EF in 2022 and diastolic with cardiomyopathy-related to prior myocarditis in 2018-alcohol use likely contributes # Ventricular tachycardia with ICD in place-follows with Dr. Lalla Brothers and Dr. Jomarie Longs #HTN S: Medication:Carvedilol 12.5 mg in the morning and 25 mg in the evening, Entresto 97-103 mg, Farxiga 10 mg, spironolactone 50 mg, torsemide 40 mg daily  -anticoagulation with eliquis 5 mg twice daily   Edema: none Weight gain:stable Shortness of breath: mild at times but feels weight related  A/P: CHF-euvolemic- continue current medications   Hypertension-at goal- continue current medications  V tach- appropriate follow up with cardiology   # Atrial fibrillation-required cardioversion 10/05/2022 with ICD with dual-chamber device to allow pacing S: Rate controlled with coreg 12.5 mg in am and 25 mg in pm Anticoagulated with eliquis  5 mg twice daily  A/P: appropriately anticoagulated and rate controlled- continue current medicine    #CAD-nonobstructive #hyperlipidemia # Aortic atherosclerosis S: Medication: Rosuvastatin 20 mg daily Lab Results  Component Value Date   CHOL 170 03/06/2023   HDL 48.70 03/06/2023   LDLCALC 48 08/22/2022   LDLDIRECT 85.0 03/06/2023   TRIG 315.0 (H) 03/06/2023   CHOLHDL 3 03/06/2023  A/P: CAD asymptomatic other than mild shortness of breath but could be deconditioning Lipids at goal in past - continue current medications but "Cholesterol slightly high- make sure taking rosuvastatin 20 mg daily- prefer LDL under 70 and you are at 85" Aortic atherosclerosis (presumed stable)- LDL goal ideally <70 - see above comments    # Hyperglycemia/insulin  resistance/prediabetes-A1c as high as 6.4 05/03/21 S:  Medication: Marcelline Deist mainly for heart failure Lab Results  Component Value Date   HGBA1C 5.6 02/27/2023   HGBA1C 6.0 08/22/2022   HGBA1C 5.5 10/12/2021  Point of Care (POC) test recent  A/P: stable recently- continue to monitor   # Anxiety/depression S: Medication: Sertraline 100 mg - Suicidal ideation-none A/P: full remission- continue current medications     # OSA-compliant with CPAP but still having trouble with sleep. Trazodone helpful in past- wants to try again- refill today . Asks about Ambien but need more sustained remisison from alcohol first and not as good of quality of sleep -advised against tylenol pm   #macrocytosis- improving with cutting alcohol- encouraged full cessation long term- I do not think we need further workup at this time   Recommended follow up: Return in about 3 months (around 06/14/2023) for followup or sooner if needed.Schedule b4 you leave. Future Appointments  Date Time Provider Department Center  03/24/2023  4:00 PM Felecia Shelling, [place] [facility]  04/23/2023  4:00 PM Croitoru, Rachelle Hora, MD [facility] None  05/14/2023  7:05 AM [facility] DEVICE REMOTES [facility]  08/13/2023  7:05 AM [facility] DEVICE REMOTES [facility]  11/12/2023  7:05 AM [facility] DEVICE REMOTES [facility]  02/11/2024  7:05 AM [facility] DEVICE REMOTES [facility]  05/12/2024  7:05 AM [facility] DEVICE REMOTES [facility]  08/11/2024  7:05 AM [facility] DEVICE REMOTES [facility]  11/10/2024  7:05 AM [facility] DEVICE REMOTES [facility]   Lab/Order associations:NOT fasting- just had labs   ICD-10-CM   1. Routine general medical examination at  a health care facility  Z00.00     2. Screen for colon cancer  Z12.11 Cologuard    3. Aortic atherosclerosis (HCC) Chronic I70.0     4. Alcohol dependence in remission (HCC)  F10.21      5. Screening for prostate cancer  Z12.5 PSA     Meds ordered this encounter  Medications   traZODone (DESYREL) 50 MG tablet    Sig: Take 1 tablet (50 mg total) by mouth at bedtime.    Dispense:  90 tablet    Refill:  3   Return precautions advised.  [name], MD

## 2023-03-14 NOTE — Telephone Encounter (Signed)
Pharmacy Patient Advocate Encounter   Received notification that prior authorization for Byetta is required/requested.  Prior authorization was not submitted due to Byetta is only used for type 2 diabetes.    PA submitted to [employer] via CoverMyMeds for [facility]  #  [id number] Status is drug is covered by current benefit plan. No further PA activity needed.   Per test claim, not plan covered/member pays 100% cash rate. Will not apply toward plan accumulators.

## 2023-03-14 NOTE — Patient Instructions (Addendum)
Let us know when you decide to get your [facility] vaccines- is tough but good vaccine  please let us know if you have not received your Cologuard within 3 weeks-please complete after you receive  Cholesterol slightly high- make sure taking rosuvastatin 20 mg daily- prefer LDL under 70 and you are at 85  I suggest myfitnesspal Use 0.5 pounds per week weight loss goal Set a reasonable goal such as 5-10 lbs and can reset goal once you reach out Do not connect your step counter to this- watch or phone Update me in 2-3 months with how you are doing  Call back for lab visit for PSA  Recommended follow up: Return in about 3 months (around 06/14/2023) for followup or sooner if needed.Schedule b4 you leave.

## 2023-03-17 ENCOUNTER — Other Ambulatory Visit: Payer: Self-pay

## 2023-03-17 ENCOUNTER — Other Ambulatory Visit (HOSPITAL_COMMUNITY): Payer: Self-pay | Admitting: Cardiology

## 2023-03-17 DIAGNOSIS — I252 Old myocardial infarction: Secondary | ICD-10-CM

## 2023-03-17 MED ORDER — SEMAGLUTIDE-WEIGHT MANAGEMENT 0.5 MG/0.5ML ~~LOC~~ SOAJ: 0.5000 mg | SUBCUTANEOUS | 0 refills | Status: DC

## 2023-03-17 MED ORDER — SEMAGLUTIDE-WEIGHT MANAGEMENT 1.7 MG/0.75ML ~~LOC~~ SOAJ: 1.7000 mg | SUBCUTANEOUS | 0 refills | Status: DC

## 2023-03-17 MED ORDER — SEMAGLUTIDE-WEIGHT MANAGEMENT 1 MG/0.5ML ~~LOC~~ SOAJ
1.0000 mg | SUBCUTANEOUS | 0 refills | Status: DC
Start: 2023-05-12 — End: 2023-04-11

## 2023-03-17 MED ORDER — SEMAGLUTIDE-WEIGHT MANAGEMENT 2.4 MG/0.75ML ~~LOC~~ SOAJ
2.4000 mg | SUBCUTANEOUS | 11 refills | Status: DC
Start: 2023-07-07 — End: 2023-04-11

## 2023-03-17 MED ORDER — SEMAGLUTIDE-WEIGHT MANAGEMENT 0.25 MG/0.5ML ~~LOC~~ SOAJ
0.2500 mg | SUBCUTANEOUS | 0 refills | Status: DC
Start: 2023-03-17 — End: 2023-04-11

## 2023-03-19 ENCOUNTER — Telehealth (HOSPITAL_COMMUNITY): Payer: Self-pay

## 2023-03-19 NOTE — Telephone Encounter (Signed)
I have completed a PA for the [facility] already.

## 2023-03-19 NOTE — Telephone Encounter (Signed)
Have already sent my chart message informing patient.

## 2023-03-19 NOTE — Telephone Encounter (Signed)
LATE DOCUMENTATION  Patient's medical records were faxed on 03/18/23.  Received a fax requesting medical records from [employer]. Records were successfully faxed to: [phone] ,which was the number provided.. Medical request form will be scanned into patients chart.

## 2023-03-24 ENCOUNTER — Ambulatory Visit: Payer: Commercial Managed Care - HMO | Admitting: Podiatry

## 2023-03-30 ENCOUNTER — Other Ambulatory Visit (HOSPITAL_COMMUNITY): Payer: Self-pay | Admitting: Cardiology

## 2023-03-31 ENCOUNTER — Telehealth: Payer: Self-pay | Admitting: Family Medicine

## 2023-03-31 DIAGNOSIS — Z1283 Encounter for screening for malignant neoplasm of skin: Secondary | ICD-10-CM

## 2023-03-31 NOTE — Telephone Encounter (Signed)
Referral has been placed to requested location.

## 2023-03-31 NOTE — Telephone Encounter (Signed)
Reason for Referral Request:    Has Patient been seen by PCP for this complaint? States yes  No, Please schedule patient for appointment for complaint.  Yes, Please find out following information:  Reason:   Referral to which Specialty: Dermatology  Preferred office/ provider:  [facility] in [place]

## 2023-04-09 ENCOUNTER — Ambulatory Visit: Payer: Commercial Managed Care - HMO | Admitting: Podiatry

## 2023-04-10 NOTE — Telephone Encounter (Signed)
Pt states Wygovy is not approved but Trulicity is. Please send an RX for Trulicity to pharmacy

## 2023-04-11 ENCOUNTER — Other Ambulatory Visit (HOSPITAL_BASED_OUTPATIENT_CLINIC_OR_DEPARTMENT_OTHER): Payer: Self-pay

## 2023-04-11 ENCOUNTER — Other Ambulatory Visit: Payer: Self-pay

## 2023-04-11 ENCOUNTER — Encounter (HOSPITAL_BASED_OUTPATIENT_CLINIC_OR_DEPARTMENT_OTHER): Payer: Self-pay

## 2023-04-11 DIAGNOSIS — I252 Old myocardial infarction: Secondary | ICD-10-CM

## 2023-04-11 MED ORDER — TRULICITY 0.75 MG/0.5ML ~~LOC~~ SOAJ
0.7500 mg | SUBCUTANEOUS | 0 refills | Status: DC
  Filled 2023-04-11: qty 2, 28d supply, fill #0

## 2023-04-11 MED ORDER — TRULICITY 3 MG/0.5ML ~~LOC~~ SOAJ
3.0000 mg | SUBCUTANEOUS | 0 refills | Status: DC
Start: 2023-06-02 — End: 2023-08-22
  Filled 2023-04-11: qty 2, 28d supply, fill #0

## 2023-04-11 MED ORDER — TRULICITY 1.5 MG/0.5ML ~~LOC~~ SOAJ
1.5000 mg | SUBCUTANEOUS | 0 refills | Status: DC
Start: 2023-05-05 — End: 2023-08-22
  Filled 2023-04-11: qty 2, 28d supply, fill #0

## 2023-04-11 MED ORDER — TRULICITY 4.5 MG/0.5ML ~~LOC~~ SOAJ
4.5000 mg | SUBCUTANEOUS | 0 refills | Status: DC
Start: 2023-06-30 — End: 2023-08-22
  Filled 2023-04-11: qty 2, 28d supply, fill #0

## 2023-04-11 NOTE — Telephone Encounter (Signed)
I have sent this prescription to the pharmacy on file.   

## 2023-04-12 ENCOUNTER — Other Ambulatory Visit (HOSPITAL_BASED_OUTPATIENT_CLINIC_OR_DEPARTMENT_OTHER): Payer: Self-pay

## 2023-04-17 ENCOUNTER — Other Ambulatory Visit (HOSPITAL_BASED_OUTPATIENT_CLINIC_OR_DEPARTMENT_OTHER): Payer: Self-pay

## 2023-04-18 ENCOUNTER — Ambulatory Visit: Payer: Commercial Managed Care - HMO | Admitting: Podiatry

## 2023-04-18 ENCOUNTER — Telehealth: Payer: Self-pay | Admitting: Cardiovascular Disease

## 2023-04-18 MED ORDER — ENTRESTO 97-103 MG PO TABS: 1.0000 | ORAL_TABLET | Freq: Two times a day (BID) | ORAL | 3 refills | Status: DC

## 2023-04-18 NOTE — Telephone Encounter (Signed)
*  STAT* If patient is at the pharmacy, call can be transferred to refill team.   1. Which medications need to be refilled? (please list name of each medication and dose if known)   ENTRESTO 97-103 MG   2. Would you like to learn more about the convenience, safety, & potential cost savings by using the [facility]?    3. Are you open to using the [facility] (Type [facility]. ).   4. Which pharmacy/location (including street and city if local pharmacy) is medication to be sent to?  [id number] [name], [place] - [address]   5. Do they need a 30 day or 90 day supply?  90 day  Patient stated he is completely out of this medication.  Patient has appointment scheduled on 8/7.

## 2023-04-18 NOTE — Telephone Encounter (Signed)
Pt's medication was sent to pt's pharmacy as requested. Confirmation received.  °

## 2023-04-23 ENCOUNTER — Other Ambulatory Visit (HOSPITAL_BASED_OUTPATIENT_CLINIC_OR_DEPARTMENT_OTHER): Payer: Self-pay

## 2023-04-23 ENCOUNTER — Ambulatory Visit: Payer: Commercial Managed Care - HMO | Admitting: Cardiovascular Disease

## 2023-04-27 ENCOUNTER — Other Ambulatory Visit (HOSPITAL_BASED_OUTPATIENT_CLINIC_OR_DEPARTMENT_OTHER): Payer: Self-pay

## 2023-04-29 ENCOUNTER — Ambulatory Visit: Payer: Managed Care, Other (non HMO)

## 2023-04-30 ENCOUNTER — Other Ambulatory Visit (HOSPITAL_BASED_OUTPATIENT_CLINIC_OR_DEPARTMENT_OTHER): Payer: Self-pay

## 2023-05-02 ENCOUNTER — Telehealth: Payer: Self-pay | Admitting: Family Medicine

## 2023-05-02 ENCOUNTER — Other Ambulatory Visit (HOSPITAL_BASED_OUTPATIENT_CLINIC_OR_DEPARTMENT_OTHER): Payer: Self-pay

## 2023-05-02 NOTE — Telephone Encounter (Signed)
Patient states the following medication was previously prescribed by Clerance Lav (Patient was referred to by  Dr. Durene Cal). Patient states Dr. Durene Cal is taking over prescribing the following:  Prescription Request  05/02/2023  LOV: 03/14/2023  What is the name of the medication or equipment?  sertraline 100 MG tablet Commonly known as: ZOLOFT Take 100 mg by mouth daily.    Have you contacted your pharmacy to request a refill? Yes   Which pharmacy would you like this sent to?  [id number] [name], [place] - [address] [place] [address] Phone: [phone] Fax: [phone]    Patient notified that their request is being sent to the clinical staff for review and that they should receive a response within 2 business days.   Please advise at Mobile [phone] (mobile)

## 2023-05-02 NOTE — Telephone Encounter (Signed)
You may fill sertraline 100 mg daily #90 w 3 refills

## 2023-05-02 NOTE — Telephone Encounter (Signed)
See below, not on current med list.

## 2023-05-05 ENCOUNTER — Other Ambulatory Visit: Payer: Self-pay

## 2023-05-05 MED ORDER — SERTRALINE HCL 100 MG PO TABS: ORAL_TABLET | ORAL | 3 refills | Status: DC

## 2023-05-05 NOTE — Telephone Encounter (Signed)
Rx refilled.

## 2023-05-06 ENCOUNTER — Other Ambulatory Visit (HOSPITAL_BASED_OUTPATIENT_CLINIC_OR_DEPARTMENT_OTHER): Payer: Self-pay

## 2023-05-09 ENCOUNTER — Encounter: Payer: Self-pay | Admitting: Family Medicine

## 2023-05-09 ENCOUNTER — Ambulatory Visit (INDEPENDENT_AMBULATORY_CARE_PROVIDER_SITE_OTHER): Payer: Commercial Managed Care - HMO | Admitting: Family Medicine

## 2023-05-09 VITALS — BP 101/66 | HR 77 | Temp 97.1°F | Ht 71.0 in | Wt 275.8 lb

## 2023-05-09 DIAGNOSIS — I10 Essential (primary) hypertension: Secondary | ICD-10-CM | POA: Diagnosis not present

## 2023-05-09 DIAGNOSIS — R059 Cough, unspecified: Secondary | ICD-10-CM

## 2023-05-09 DIAGNOSIS — R739 Hyperglycemia, unspecified: Secondary | ICD-10-CM | POA: Diagnosis not present

## 2023-05-09 LAB — POC COVID19 BINAXNOW: SARS Coronavirus 2 Ag: NEGATIVE

## 2023-05-09 MED ORDER — BENZONATATE 200 MG PO CAPS: 200.0000 mg | ORAL_CAPSULE | Freq: Two times a day (BID) | ORAL | 0 refills | Status: DC | PRN

## 2023-05-09 MED ORDER — METHYLPREDNISOLONE ACETATE 40 MG/ML IJ SUSP
40.0000 mg | Freq: Once | INTRAMUSCULAR | Status: AC
Start: 2023-05-09 — End: 2023-05-09
  Administered 2023-05-09: 40 mg via INTRAMUSCULAR

## 2023-05-09 MED ORDER — AZITHROMYCIN 250 MG PO TABS: ORAL_TABLET | ORAL | 0 refills | Status: DC

## 2023-05-09 NOTE — Progress Notes (Signed)
   John [name] is a 62 y.o. male who presents today for an office visit.  Assessment/Plan:  New/Acute Problems: Cough COVID test negative.  Has quite a bit of wheezing rhonchi on exam today but otherwise reassuring exam without any signs of respiratory distress.  Likely has bronchitis.  Given length of symptoms and the fact that symptoms are worsening would be reasonable to start antibiotics at this point.  He additionally is at increased risk due to his CHF and cardiomyopathy.  Will start azithromycin.  Will also give 40 mg IM Depo-Medrol.  He can use over-the-counter medications as needed.  Will start Tessalon for cough.  Encouraged hydration.  We discussed reasons to return to care.  Follow-up as needed.  Chronic Problems Addressed Today: Essential hypertension  Blood pressure at goal today on current regimen Coreg 12.5 mg twice daily, Entresto 97-103 twice daily, spironolactone 25 mg daily.   Prediabetes No official diagnosis of diabetes in the chart with most elevated A1c of 6.4.  His most recent A1c was 5.6.  He is on [place] and Trulicity.  She did not have any issues with elevated glucose reading with above steroid injection.     Subjective:  HPI:  Patient here with cough and congestion for the last several days.  Cough has been productive of thick sputum. Some shortness of breath and wheeze. No fevers or chills. No specific treatments tried. Wife has been sick with similar symptoms. Symptoms are getting worse.        Objective:  Physical Exam: BP 101/66   Pulse 77   Temp (!) 97.1 F (36.2 C) (Temporal)   Ht 5\' 11"  (1.803 m)   Wt 275 lb 12.8 oz (125.1 kg)   SpO2 97%   BMI 38.47 kg/m   Gen: No acute distress, resting comfortably CV: Regular rate and rhythm with no murmurs appreciated Pulm: Normal work of breathing, diffuse wheeze and rhonchi noted throughout all lung fields.  Speaking in full sentences. Neuro: Grossly normal, moves all extremities Psych: Normal  affect and thought content      John [name] M. Jimmey Ralph, MD 05/09/2023 12:03 PM

## 2023-05-09 NOTE — Patient Instructions (Addendum)
It was very nice to see you today!  We will give you a steroid shot today.  Please start the antibiotic and cough medication.  Let us know if not improving.  Return if symptoms worsen or fail to improve.   Take care, Dr Jimmey Ralph  PLEASE NOTE:  If you had any lab tests, please let us know if you have not heard back within a few days. You may see your results on mychart before we have a chance to review them but we will give you a call once they are reviewed by [place].   If we ordered any referrals today, please let us know if you have not heard from their office within the next week.   If you had any urgent prescriptions sent in today, please check with the pharmacy within an hour of our visit to make sure the prescription was transmitted appropriately.   Please try these tips to maintain a healthy lifestyle:  Eat at least 3 REAL meals and 1-2 snacks per day.  Aim for no more than 5 hours between eating.  If you eat breakfast, please do so within one hour of getting up.   Each meal should contain half fruits/vegetables, one quarter protein, and one quarter carbs (no bigger than a computer mouse)  Cut down on sweet beverages. This includes juice, soda, and sweet tea.   Drink at least 1 glass of water with each meal and aim for at least 8 glasses per day  Exercise at least 150 minutes every week.

## 2023-05-14 LAB — CUP PACEART REMOTE DEVICE CHECK
Battery Remaining Longevity: 89 mo
Battery Remaining Percentage: 91 %
Battery Voltage: 3.02 V
Brady Statistic AP VP Percent: 4.5 %
Brady Statistic AP VS Percent: 81 %
Brady Statistic AS VP Percent: 1 %
Brady Statistic AS VS Percent: 12 %
Brady Statistic RA Percent Paced: 82 %
Brady Statistic RV Percent Paced: 4.5 %
Date Time Interrogation Session: 20240828020455
HighPow Impedance: 100 Ohm
Implantable Lead Connection Status: 753985
Implantable Lead Connection Status: 753985
Implantable Lead Implant Date: 20190821
Implantable Lead Implant Date: 20240226
Implantable Lead Location: 753859
Implantable Lead Location: 753860
Implantable Lead Model: 7122
Implantable Pulse Generator Implant Date: 20240226
Lead Channel Impedance Value: 480 Ohm
Lead Channel Impedance Value: 510 Ohm
Lead Channel Pacing Threshold Amplitude: 0.75 V
Lead Channel Pacing Threshold Amplitude: 0.75 V
Lead Channel Pacing Threshold Pulse Width: 0.5 ms
Lead Channel Pacing Threshold Pulse Width: 0.5 ms
Lead Channel Sensing Intrinsic Amplitude: 12 mV
Lead Channel Sensing Intrinsic Amplitude: 3.1 mV
Lead Channel Setting Pacing Amplitude: 2 V
Lead Channel Setting Pacing Amplitude: 2.5 V
Lead Channel Setting Pacing Pulse Width: 0.5 ms
Lead Channel Setting Sensing Sensitivity: 0.5 mV
Pulse Gen Serial Number: 211012451
Zone Setting Status: 755011

## 2023-05-16 ENCOUNTER — Emergency Department (HOSPITAL_BASED_OUTPATIENT_CLINIC_OR_DEPARTMENT_OTHER): Payer: Medicare Other | Admitting: Radiology

## 2023-05-16 ENCOUNTER — Emergency Department (HOSPITAL_BASED_OUTPATIENT_CLINIC_OR_DEPARTMENT_OTHER)
Admission: EM | Admit: 2023-05-16 | Discharge: 2023-05-16 | Disposition: A | Discharge status: Home / Self Care | Payer: Medicare Other | Attending: Emergency Medicine | Admitting: Emergency Medicine

## 2023-05-16 ENCOUNTER — Encounter (HOSPITAL_BASED_OUTPATIENT_CLINIC_OR_DEPARTMENT_OTHER): Payer: Self-pay | Admitting: Emergency Medicine

## 2023-05-16 ENCOUNTER — Emergency Department (HOSPITAL_BASED_OUTPATIENT_CLINIC_OR_DEPARTMENT_OTHER): Payer: Medicare Other

## 2023-05-16 ENCOUNTER — Other Ambulatory Visit: Payer: Self-pay

## 2023-05-16 DIAGNOSIS — M79673 Pain in unspecified foot: Secondary | ICD-10-CM

## 2023-05-16 DIAGNOSIS — S8991XA Unspecified injury of right lower leg, initial encounter: Secondary | ICD-10-CM | POA: Diagnosis present

## 2023-05-16 DIAGNOSIS — W19XXXA Unspecified fall, initial encounter: Secondary | ICD-10-CM | POA: Diagnosis not present

## 2023-05-16 DIAGNOSIS — Z7901 Long term (current) use of anticoagulants: Secondary | ICD-10-CM | POA: Insufficient documentation

## 2023-05-16 DIAGNOSIS — S8001XA Contusion of right knee, initial encounter: Secondary | ICD-10-CM | POA: Diagnosis not present

## 2023-05-16 DIAGNOSIS — S90121A Contusion of right lesser toe(s) without damage to nail, initial encounter: Secondary | ICD-10-CM | POA: Insufficient documentation

## 2023-05-16 DIAGNOSIS — Z79899 Other long term (current) drug therapy: Secondary | ICD-10-CM | POA: Insufficient documentation

## 2023-05-16 DIAGNOSIS — S8990XA Unspecified injury of unspecified lower leg, initial encounter: Secondary | ICD-10-CM

## 2023-05-16 MED ORDER — OXYCODONE HCL 5 MG PO TABS
5.0000 mg | ORAL_TABLET | Freq: Four times a day (QID) | ORAL | 0 refills | Status: DC | PRN
Start: 2023-05-16 — End: 2023-07-22

## 2023-05-16 MED ORDER — MORPHINE SULFATE (PF) 4 MG/ML IV SOLN
6.0000 mg | Freq: Once | INTRAVENOUS | Status: AC
  Administered 2023-05-16: 6 mg via INTRAMUSCULAR
  Filled 2023-05-16: qty 2

## 2023-05-16 MED ORDER — ONDANSETRON 4 MG PO TBDP
4.0000 mg | ORAL_TABLET | Freq: Once | ORAL | Status: AC
  Administered 2023-05-16: 4 mg via ORAL
  Filled 2023-05-16: qty 1

## 2023-05-16 NOTE — ED Notes (Signed)
Pt d/c home with vistor per MD order. Discharge summary reviewed, pt verbalizes understanding. Off unit via WC- no s/s of acute distress noted. Pt visitor is discharge ride home.

## 2023-05-16 NOTE — ED Triage Notes (Addendum)
Pt c/o R ankle pain and R ankle pain s/p mechanical fall yesterday. Denies head injury or LOC. Unable to bear weight

## 2023-05-16 NOTE — ED Provider Notes (Signed)
[place] EMERGENCY DEPARTMENT AT [facility] Provider Note   CSN: [id number] Arrival date & time: 05/16/23  1421     History  Chief Complaint  Patient presents with   John [name] is a 62 y.o. male.  Who presents emergency department with a chief complaint of fall.  Patient had injured his toes recently after kicking a nightstand.  Yesterday he got caught on the mat and twisted his right ankle and then fell onto his right knee.  Patient has been able to bear weight but states that his knee is "killing me."  He describes throbbing pain especially over the patella.  He is able to bend the knee and states that it actually feels better in deep flexion.  He did not take anything for pain prior to arrival.  He denies numbness or tingling.   Fall       Home Medications Prior to Admission medications   Medication Sig Start Date End Date Taking? Authorizing Provider  apixaban (ELIQUIS) 5 MG TABS tablet Take 1 tablet (5 mg total) by mouth 2 (two) times daily. Restart 3/3 with the am dose. 11/17/22   Graciella Freer, PA-C  azithromycin (ZITHROMAX) 250 MG tablet Take 2 tabs day 1, then 1 tab daily 05/09/23   Ardith Dark, MD  benzonatate (TESSALON) 200 MG capsule Take 1 capsule (200 mg total) by mouth 2 (two) times daily as needed for cough. 05/09/23   Ardith Dark, MD  carvedilol (COREG) 25 MG tablet Take 0.5 tablets (12.5 mg total) by mouth every morning AND 1 tablet (25 mg total) every evening. 12/09/22   Croitoru, Mihai, MD  Cyanocobalamin (B-12) 1000 MCG CAPS Take 1 tablet by mouth daily at 6 (six) AM. 03/06/23   Lula Olszewski, MD  dapagliflozin propanediol (FARXIGA) 10 MG TABS tablet TAKE 1 TABLET BY MOUTH EVERY DAY BEFORE BREAKFAST 03/17/23   Laurey Morale, MD  Dulaglutide (TRULICITY) 0.75 MG/0.5ML SOPN Inject 0.75 mg into the skin once a week. 04/11/23   Lula Olszewski, MD  Dulaglutide (TRULICITY) 1.5 MG/0.5ML SOPN Inject 1.5 mg into the skin once a  week. Patient not taking: Reported on 05/09/2023 05/05/23   Lula Olszewski, MD  Dulaglutide (TRULICITY) 3 MG/0.5ML SOPN Inject 3 mg as directed once a week. Patient not taking: Reported on 05/09/2023 06/02/23   Lula Olszewski, MD  Dulaglutide (TRULICITY) 4.5 MG/0.5ML SOPN Inject 4.5 mg as directed once a week. 06/30/23   Lula Olszewski, MD  famotidine-calcium carbonate-magnesium hydroxide (PEPCID COMPLETE) 10-800-165 MG chewable tablet Chew 1 tablet by mouth 2 (two) times daily as needed. 02/18/23   Lula Olszewski, MD  Multiple Vitamin (MULTIVITAMIN WITH MINERALS) TABS tablet Take 1 tablet by mouth in the morning.    [provider]  rosuvastatin (CRESTOR) 20 MG tablet TAKE 1 TABLET BY MOUTH EVERY DAY 10/14/22   Laurey Morale, MD  sacubitril-valsartan (ENTRESTO) 97-103 MG Take 1 tablet by mouth 2 (two) times daily. 04/18/23   Lanier Prude, MD  sertraline (ZOLOFT) 100 MG tablet TAKE 1 TABLET BY MOUTH EVERY DAY 05/05/23   Shelva Majestic, MD  sildenafil (VIAGRA) 100 MG tablet TAKE 1 TABLET (100 MG TOTAL) BY MOUTH AT BEDTIME AS NEEDED FOR ERECTILE DYSFUNCTION. 01/27/23   Croitoru, Mihai, MD  spironolactone (ALDACTONE) 50 MG tablet TAKE 1 TABLET BY MOUTH EVERY DAY Patient taking differently: Take 25 mg by mouth daily. 02/04/23   Laurey Morale, MD  torsemide (DEMADEX) 20 MG tablet TAKE 3 TABLETS BY MOUTH EVERY DAY 03/31/23   Laurey Morale, MD  traZODone (DESYREL) 50 MG tablet Take 1 tablet (50 mg total) by mouth at bedtime. 03/14/23   Shelva Majestic, MD      Allergies    Chlorhexidine, Cymbalta [duloxetine hcl], and Hydrocodone    Review of Systems   Review of Systems  Physical Exam Updated Vital Signs BP 96/68 (BP Location: Left Arm)   Pulse 71   Temp 98.5 F (36.9 C) (Oral)   Resp 15   SpO2 95%  Physical Exam Vitals and nursing note reviewed.  Constitutional:      General: He is not in acute distress.    Appearance: He is well-developed. He is not diaphoretic.   HENT:     Head: Normocephalic and atraumatic.  Eyes:     General: No scleral icterus.    Conjunctiva/sclera: Conjunctivae normal.  Cardiovascular:     Rate and Rhythm: Normal rate and regular rhythm.     Heart sounds: Normal heart sounds.  Pulmonary:     Effort: Pulmonary effort is normal. No respiratory distress.     Breath sounds: Normal breath sounds.  Abdominal:     Palpations: Abdomen is soft.     Tenderness: There is no abdominal tenderness.  Musculoskeletal:     Cervical back: Normal range of motion and neck supple.     Comments: Swelling over the right knee, tender along the medial joint line, bruising noted, small abrasions. Right foot and ankle examination reveals swelling over the lateral malleolus and dorsum of the foot.  Bruising in the toes.  No plantar bruising.  Full range of motion with with stiffness and discomfort.  Normal strength and sensation  Skin:    General: Skin is warm and dry.  Neurological:     Mental Status: He is alert.  Psychiatric:        Behavior: Behavior normal.     ED Results / Procedures / Treatments   Labs (all labs ordered are listed, but only abnormal results are displayed) Labs Reviewed - No data to display  EKG None  Radiology DG Foot Complete Right  Result Date: 05/16/2023 CLINICAL DATA:  Swelling injury. EXAM: RIGHT FOOT COMPLETE - 3 VIEW COMPARISON:  None Available. FINDINGS: Soft swelling is present over the dorsum of the foot. No acute osseous abnormality is present. Degenerative changes are present in the midfoot. Mild osteopenia is present. IMPRESSION: Soft tissue swelling over the dorsum of the foot without underlying fracture. Electronically Signed   By: Marin Roberts M.D.   On: 05/16/2023 15:35   DG Ankle Complete Right  Result Date: 05/16/2023 CLINICAL DATA:  Fall.  Right ankle pain/swelling. EXAM: RIGHT ANKLE - COMPLETE 3+ VIEW COMPARISON:  None Available. FINDINGS: No acute fracture or dislocation. No  aggressive osseous lesion. Ankle mortise appears intact. Calcaneal spur noted along the Achilles tendon and Plantar aponeurosis attachment sites. There is mild asymmetric soft tissue swelling overlying the lateral malleolus without discrete underlying fracture. Findings are concerning for soft tissue/ligamentous injury. No radiopaque foreign bodies. IMPRESSION: 1. Lateral soft tissue swelling, concerning for underlying soft tissue/ligamentous injury. 2. No acute fracture or dislocation. Electronically Signed   By: Jules Schick M.D.   On: 05/16/2023 15:20   DG Knee Complete 4 Views Right  Result Date: 05/16/2023 CLINICAL DATA:  Fall.  Right knee pain/swelling. EXAM: RIGHT KNEE - COMPLETE 4+ VIEW COMPARISON:  None Available. FINDINGS: There is subtle breach  in the articular surface of the lateral tibial condyle, marked with electronic arrow sign on image number 3. It is unclear whether this represents a true undisplaced fracture versus artifact. Correlate clinically to determine the need for additional imaging with CT scan. No other acute fracture or dislocation. No aggressive osseous lesion. Mild degenerative changes of the knee joint noted. No knee effusion or focal soft tissue swelling. No radiopaque foreign bodies. IMPRESSION: 1. Subtle breach in the articular surface of the lateral tibial condyle/tibial plateau. It is unclear whether this represents a true undisplaced fracture versus artifact. Correlate clinically to determine the need for additional imaging with CT scan. Electronically Signed   By: Jules Schick M.D.   On: 05/16/2023 15:19    Procedures Procedures    Medications Ordered in ED Medications  morphine (PF) 4 MG/ML injection 6 mg (6 mg Intramuscular Given 05/16/23 1636)  ondansetron (ZOFRAN-ODT) disintegrating tablet 4 mg (4 mg Oral Given 05/16/23 1636)    ED Course/ Medical Decision Making/ A&P                                 Medical Decision Making Amount and/or Complexity of  Data Reviewed Radiology: ordered and independent interpretation performed.  Risk Prescription drug management.           Final Clinical Impression(s) / ED Diagnoses Final diagnoses:  None  Patient here with mechanical fall.  I visualized and interpreted right foot ankle and knee x-ray along with follow-up CT film.  No acute findings on examination after questionable possible tibial injury on plain film of the knee.  Is on Eliquis but does not have an obvious large hemarthrosis.  Plan for supportive care, pain control, outpatient follow-up and return precautions.  Patient given knee immobilizer, ASO splint and pain meds  PDMP reviewed during this encounter.   Rx / DC Orders ED Discharge Orders     None         Arthor Captain, PA-C 05/16/23 1749    Arby Barrette, MD 05/21/23 [phone]

## 2023-05-16 NOTE — Discharge Instructions (Signed)
Use your knee immobilizer and ankle brace for comfort.  Ice and elevate the knee.  You may ambulate as tolerated or use your crutches to remove weight from the knee. Tylenol as needed along with the pain medications I have prescribed and follow closely with Dr. Linna Caprice.  Get help right away if you have any new or worsening symptoms or pain out of proportion from today's injury.

## 2023-05-27 ENCOUNTER — Other Ambulatory Visit (HOSPITAL_COMMUNITY): Payer: Self-pay | Admitting: Cardiology

## 2023-05-29 ENCOUNTER — Ambulatory Visit: Payer: Commercial Managed Care - HMO | Admitting: Cardiovascular Disease

## 2023-06-05 ENCOUNTER — Ambulatory Visit: Payer: Commercial Managed Care - HMO | Admitting: Cardiovascular Disease

## 2023-06-06 ENCOUNTER — Telehealth: Payer: Self-pay

## 2023-06-06 NOTE — Telephone Encounter (Signed)
I called and lm for pt tcb due to receiving incoming fax paperwork for disability and I have  few questions for the pt when he returns the call.

## 2023-06-12 ENCOUNTER — Other Ambulatory Visit (HOSPITAL_COMMUNITY): Payer: Self-pay | Admitting: Cardiology

## 2023-06-26 ENCOUNTER — Other Ambulatory Visit (HOSPITAL_COMMUNITY): Payer: Self-pay

## 2023-06-30 ENCOUNTER — Telehealth: Payer: Self-pay

## 2023-06-30 NOTE — Telephone Encounter (Signed)
Following alert received from [employer] received for 4 VHR episodes, longest 26 sec at 193 bpm, V>A at 193-229 bpm, no therapies delivered.  Attempted to contact pt. Phone went straight to VM. Mailbox if full. Unable to leave VM.

## 2023-07-01 NOTE — Telephone Encounter (Signed)
Left detailed message requesting call back

## 2023-07-02 NOTE — Telephone Encounter (Signed)
Patient returned RN's Jenny's call.

## 2023-07-02 NOTE — Telephone Encounter (Signed)
Patient returned call.   Patient reports increased fatigue in the morning after waking up but overall feeling fine. Patient denies any symptoms during the episodes and reports compliance with medications. Medications were verified and confirmed with patient,. Pt request a sooner apt with Dr Royann Shivers as he has to cancel his postpone his previous one. Pt advised I will forward to Dr. Royann Shivers to review and EP scheduler for sooner apt. Pt agreeable to plan and appreciative of call.

## 2023-07-03 NOTE — Telephone Encounter (Signed)
Yes, that should be fine.

## 2023-07-22 ENCOUNTER — Encounter: Payer: Self-pay | Admitting: Internal Medicine

## 2023-07-22 ENCOUNTER — Ambulatory Visit (INDEPENDENT_AMBULATORY_CARE_PROVIDER_SITE_OTHER): Payer: Managed Care, Other (non HMO) | Admitting: Internal Medicine

## 2023-07-22 VITALS — BP 108/65 | HR 73 | Temp 98.0°F | Ht 71.0 in

## 2023-07-22 DIAGNOSIS — M109 Gout, unspecified: Secondary | ICD-10-CM | POA: Diagnosis not present

## 2023-07-22 MED ORDER — PREDNISONE 20 MG PO TABS
ORAL_TABLET | ORAL | 0 refills | Status: DC
Start: 2023-07-22 — End: 2023-08-22

## 2023-07-22 MED ORDER — METHYLPREDNISOLONE ACETATE 80 MG/ML IJ SUSP
80.0000 mg | Freq: Once | INTRAMUSCULAR | Status: AC
Start: 2023-07-22 — End: 2023-07-22
  Administered 2023-07-22: 80 mg via INTRAMUSCULAR

## 2023-07-22 MED ORDER — OXYCODONE-ACETAMINOPHEN 5-325 MG PO TABS
1.0000 | ORAL_TABLET | ORAL | 0 refills | Status: AC | PRN
Start: 2023-07-22 — End: 2023-07-27

## 2023-07-22 MED ORDER — KETOROLAC TROMETHAMINE 10 MG PO TABS
10.0000 mg | ORAL_TABLET | Freq: Four times a day (QID) | ORAL | 0 refills | Status: DC | PRN
Start: 2023-07-22 — End: 2023-09-09

## 2023-07-22 MED ORDER — KETOROLAC TROMETHAMINE 60 MG/2ML IM SOLN
60.0000 mg | Freq: Once | INTRAMUSCULAR | Status: AC
Start: 2023-07-22 — End: 2023-07-22
  Administered 2023-07-22: 60 mg via INTRAMUSCULAR

## 2023-07-22 NOTE — Progress Notes (Signed)
[facility]: [phone]   -- Medical Office Visit --  Patient:  John [name]      Age: 62 y.o.       Sex:  male  Date:   07/22/2023 Today's Healthcare Provider: Lula Olszewski, MD  =============================================================================================     Assessment & Plan Gouty arthritis of right foot Gout Flare They are experiencing an acute gout flare in the right ankle, with a history of gout in the big toe. We discussed how diet, especially beer, can trigger gout flares. For immediate relief, we will administer Toradol and Solu-Medrol injections today. We will prescribe Toradol pills (can't have colchicine due to coreg interaction), prednisone, and Percocet for pain management, cautioning them about driving and alcohol use risks with percocet and for flaring gout. An urgent referral to a podiatric specialist for a possible steroid injection is planned. We plan to start allopurinol for gout prevention once the current flare resolves and will check uric acid levels in 1-2 weeks. Follow-up We will schedule a follow-up appointment with Dr. Durene Cal or myself in 1-2 weeks.   Diagnoses and all orders for this visit: Gouty arthritis of right foot -     predniSONE (DELTASONE) 20 MG tablet; Take 2 pills for 3 days, 1 pill for 4 days -     ketorolac (TORADOL) 10 MG tablet; Take 1 tablet (10 mg total) by mouth every 6 (six) hours as needed. -     Ambulatory referral to Podiatry -     oxyCODONE-acetaminophen (PERCOCET/ROXICET) 5-325 MG tablet; Take 1 tablet by mouth every 4 (four) hours as needed for up to 5 days for severe pain (pain score 7-10). -     ketorolac (TORADOL) injection 60 mg -     methylPREDNISolone acetate (DEPO-MEDROL) injection 80 mg  Advised patient to take Pepcid complete for gastrointestinal prophylaxis. Recommended follow-up: 1 week  Future Appointments  Date Time Provider Department Center  07/28/2023  3:00 PM Shelva Majestic, MD [facility]  08/13/2023  7:05 AM [facility] DEVICE REMOTES [facility]  08/13/2023  9:00 AM Croitoru, Rachelle Hora, MD [facility] None  11/12/2023  7:05 AM [facility] DEVICE REMOTES [facility]  02/11/2024  7:05 AM [facility] DEVICE REMOTES [facility]  05/12/2024  7:05 AM [facility] DEVICE REMOTES [facility]  08/11/2024  7:05 AM [facility] DEVICE REMOTES [facility]  11/10/2024  7:05 AM [facility] DEVICE REMOTES [facility]  Patient Care Team: Shelva Majestic, MD as PCP - General (Family Medicine) Croitoru, Rachelle Hora, MD as PCP - Cardiology (Cardiology)  SUBJECTIVE: 62 y.o. male who has Degenerative joint disease of left hip; S/P shoulder replacement; GI bleed; Alcohol dependence in remission (HCC); Non-ischemic cardiomyopathy (HCC); Essential hypertension; Hyperlipidemia; Allergic rhinitis; ED (erectile dysfunction); Hyperglycemia; Insomnia; OSA (obstructive sleep apnea); Unspecified fracture of upper end of left humerus, initial encounter for closed fracture; Status post reverse total shoulder replacement, left; Paroxysmal atrial fibrillation (HCC); Chronic combined systolic and diastolic CHF, NYHA class 3 (HCC); S/P ICD (internal cardiac defibrillator) procedure; GAD (generalized anxiety disorder); Hyponatremia; Thoracic compression fracture (HCC); Aortic atherosclerosis (HCC); Osteoarthritis of right hip; Osteoarthritis of left hip; Avascular necrosis of left humeral head (HCC); Closed fracture of distal end of left radius; Left shoulder pain; Lumbar spondylosis; Major depression, recurrent, full remission (HCC); Retained orthopedic hardware; VT (ventricular tachycardia) (HCC); CHF (congestive heart failure) (HCC); Other fatigue; Morbid obesity due to excess calories (HCC); History of non-ST elevation myocardial infarction (NSTEMI); CAD (coronary artery disease); AICD discharge; Mild episode of recurrent major  depressive disorder (HCC); and Macrocytosis without anemia on their problem list.. Main reasons for visit/main concerns/chief complaint: Possible gout flare up (Ankle is black and blue and toes are numb.)   ------------------------------------------------------------------------------------------------------------------------ AI-Extracted: Discussed the use of AI scribe software for clinical note transcription with the patient, who gave verbal consent to proceed.  History of Present Illness   The patient, with a history of gout, presents with a recurrence of severe pain in the ankle, different from the previous episode which affected the big toe. The pain has been progressively worsening, to the point where the patient reports difficulty in walking and performing daily activities. The patient describes the affected area as appearing black and blue, and the pain as intense, likening it to being stabbed with needles.  The patient reports a recent increase in beer consumption, which they suspect may have triggered the gout flare. They also mention a recent indulgence in chocolate and red meat, but deny recent consumption of shellfish. The patient has been attempting weight loss, with some success, and has recently started on semaglutide, prescribed by a family member who is a doctor. The patient reports no adverse effects from the medication, except for mild constipation.  In addition to gout, the patient has a history of working long hours in a stressful environment, which may contribute to lifestyle factors affecting their health. They also report engaging in volunteer work when physically able.  The patient's last gout flare was in June, which was managed with an injection of Solu-Medrol and a prescription for Toradol and a narcotic painkiller. The treatment was effective in resolving the previous episode. The patient is not currently on allopurinol, but it was considered for future use to prevent gout  flares.       Note that patient  has a past medical history of AICD (automatic cardioverter/defibrillator) present, Anxiety, Chronic combined systolic and diastolic CHF, NYHA class 3 (HCC) (10/2016), Chronic left hip pain, Depression, Dysrhythmia, GI bleed (03/2016), Headache, Hypertensive heart disease with combined systolic and diastolic congestive heart failure (HCC) (10/2016), Nonischemic cardiomyopathy (HCC) (10/2016), Osteoarthritis, Right hand fracture, Seasonal allergies, Sleep apnea, and Wears glasses.  Problem list overviews that were updated at today's visit:No problems updated.  Med reconciliation: Current Outpatient Medications on File Prior to Visit  Medication Sig   amoxicillin (AMOXIL) 500 MG capsule TAKE 4 CAPSULES ONE HOUR PRIOR TO DENTAL PROCEDURES   azithromycin (ZITHROMAX) 250 MG tablet Take 2 tabs day 1, then 1 tab daily   benzonatate (TESSALON) 200 MG capsule Take 1 capsule (200 mg total) by mouth 2 (two) times daily as needed for cough.   carvedilol (COREG) 25 MG tablet Take 0.5 tablets (12.5 mg total) by mouth every morning AND 1 tablet (25 mg total) every evening.   Cyanocobalamin (B-12) 1000 MCG CAPS Take 1 tablet by mouth daily at 6 (six) AM.   dapagliflozin propanediol (FARXIGA) 10 MG TABS tablet Take 1 tablet (10 mg total) by mouth daily. NEEDS FOLLOW UP APPOINTMENT FOR MORE REFILLS   Dulaglutide (TRULICITY) 0.75 MG/0.5ML SOPN Inject 0.75 mg into the skin once a week.   Dulaglutide (TRULICITY) 1.5 MG/0.5ML SOPN Inject 1.5 mg into the skin once a week.   Dulaglutide (TRULICITY) 3 MG/0.5ML SOPN Inject 3 mg as directed once a week.   Dulaglutide (TRULICITY) 4.5 MG/0.5ML SOPN Inject 4.5 mg as directed once a week.   ELIQUIS 5 MG TABS tablet TAKE 1 TABLET BY MOUTH TWICE A DAY   famotidine-calcium carbonate-magnesium hydroxide (PEPCID COMPLETE) 10-800-165 MG  chewable tablet Chew 1 tablet by mouth 2 (two) times daily as needed.   Multiple Vitamin (MULTIVITAMIN WITH  MINERALS) TABS tablet Take 1 tablet by mouth in the morning.   rosuvastatin (CRESTOR) 20 MG tablet TAKE 1 TABLET BY MOUTH EVERY DAY   sacubitril-valsartan (ENTRESTO) 97-103 MG Take 1 tablet by mouth 2 (two) times daily.   Semaglutide-Weight Management 0.5 MG/0.5ML SOAJ Inject 0.5 mg into the skin once a week.   sertraline (ZOLOFT) 100 MG tablet TAKE 1 TABLET BY MOUTH EVERY DAY   sildenafil (VIAGRA) 100 MG tablet TAKE 1 TABLET (100 MG TOTAL) BY MOUTH AT BEDTIME AS NEEDED FOR ERECTILE DYSFUNCTION.   spironolactone (ALDACTONE) 50 MG tablet TAKE 1 TABLET BY MOUTH EVERY DAY (Patient taking differently: Take 25 mg by mouth daily.)   torsemide (DEMADEX) 20 MG tablet TAKE 3 TABLETS BY MOUTH EVERY DAY   traZODone (DESYREL) 50 MG tablet Take 1 tablet (50 mg total) by mouth at bedtime.   No current facility-administered medications on file prior to visit.   Medications Discontinued During This Encounter  Medication Reason   oxyCODONE (ROXICODONE) 5 MG immediate release tablet      Objective   Physical Exam     07/22/2023   11:43 AM 05/16/2023    5:48 PM 05/16/2023    2:41 PM  Vitals with BMI  Height 5\' 11"     Weight --    Systolic 108 116 96  Diastolic 65 73 68  Pulse 73 73 71   Wt Readings from Last 10 Encounters:  05/09/23 275 lb 12.8 oz (125.1 kg)  03/14/23 270 lb 12.8 oz (122.8 kg)  03/07/23 274 lb (124.3 kg)  03/06/23 269 lb 12.8 oz (122.4 kg)  02/27/23 277 lb 9.6 oz (125.9 kg)  12/09/22 272 lb (123.4 kg)  11/11/22 270 lb (122.5 kg)  10/25/22 276 lb 9.6 oz (125.5 kg)  10/23/22 274 lb 6.4 oz (124.5 kg)  10/17/22 267 lb (121.1 kg)   Vital signs reviewed.  Nursing notes reviewed. Weight trend reviewed. Abnormalities and Problem-Specific physical exam findings:   Photographs Taken 07/22/2023 :   Tender/painful to put weight on right ankle as shown, with inflammation  General Appearance:  No acute distress appreciable.   Well-groomed, healthy-appearing male.  Well proportioned with  no abnormal fat distribution.  Good muscle tone. Pulmonary:  Normal work of breathing at rest, no respiratory distress apparent. SpO2: 96 %  Musculoskeletal: All extremities are intact.  Neurological:  Awake, alert, oriented, and engaged.  No obvious focal neurological deficits or cognitive impairments.  Sensorium seems unclouded.   Speech is clear and coherent with logical content. Psychiatric:  Appropriate mood, pleasant and cooperative demeanor, thoughtful and engaged during the exam  Results         Last CBC Lab Results  Component Value Date   WBC 8.5 03/06/2023   HGB 12.9 (L) 03/06/2023   HCT 39.9 03/06/2023   MCV 100.7 (H) 03/06/2023   MCH 33.3 11/11/2022   RDW 13.8 03/06/2023   PLT 261.0 03/06/2023   Last metabolic panel Lab Results  Component Value Date   GLUCOSE 121 (H) 03/06/2023   NA 139 03/06/2023   K 4.2 03/06/2023   CL 98 03/06/2023   CO2 30 03/06/2023   BUN 17 03/06/2023   CREATININE 1.10 03/06/2023   GFR 72.34 03/06/2023   CALCIUM 9.6 03/06/2023   PHOS 4.0 03/06/2023   PROT 7.1 03/06/2023   PROT 6.9 03/06/2023   ALBUMIN 4.3 03/06/2023  LABGLOB 2.1 07/24/2022   AGRATIO 2.0 07/24/2022   BILITOT 0.7 03/06/2023   ALKPHOS 69 03/06/2023   AST 20 03/06/2023   ALT 27 03/06/2023   ANIONGAP 12 10/23/2022   Last lipids Lab Results  Component Value Date   CHOL 170 03/06/2023   HDL 48.70 03/06/2023   LDLCALC 48 08/22/2022   LDLDIRECT 85.0 03/06/2023   TRIG 315.0 (H) 03/06/2023   CHOLHDL 3 03/06/2023   Last hemoglobin A1c Lab Results  Component Value Date   HGBA1C 5.6 02/27/2023   Last thyroid functions Lab Results  Component Value Date   TSH 1.63 08/22/2022   Last vitamin D No results found for: "25OHVITD2", "25OHVITD3", "VD25OH" Last vitamin B12 and Folate Lab Results  Component Value Date   VITAMINB12 297 03/06/2023   FOLATE 12.5 02/17/2021       No results found for any visits on 07/22/23.  Appointment on 05/14/2023  Component Date  Value   Date Time Interrogation * 05/14/2023 [id number]    Pulse Generator Manufact* 05/14/2023 SJCR    Pulse Gen Model 05/14/2023 [id number] Gallant DR    Pulse Gen Serial Number 05/14/2023 [id number]    Clinic Name 05/14/2023 [facility]    Implantable Pulse Genera* 05/14/2023 Implantable Cardiac Defibulator    Implantable Pulse Genera* 05/14/2023 [id number]    Implantable Lead Manufac* 05/14/2023 OTHER    Implantable Lead Model 05/14/2023 [id number]/44 ULTIPACE    Implantable Lead Serial * 05/14/2023 [id number]    Implantable Lead Implant* 05/14/2023 [id number]    Implantable Lead Locatio* 05/14/2023 UNKNOWN    Implantable Lead Location 05/14/2023 [id number]    Implantable Lead Connect* 05/14/2023 841660    Implantable Lead Manufac* 05/14/2023 SJCR    Implantable Lead Model 05/14/2023 [address]    Implantable Lead Serial * 05/14/2023 7122Q/65    Implantable Lead Implant* 05/14/2023 [id number]    Implantable Lead Locatio* 05/14/2023 UNKNOWN    Implantable Lead Location 05/14/2023 932355    Implantable Lead Connect* 05/14/2023 732202    Lead Channel Setting Sen* 05/14/2023 0.5    Lead Channel Setting Sen* 05/14/2023 Adaptive Sensing    Lead Channel Setting Pac* 05/14/2023 2.0    Lead Channel Setting Pac* 05/14/2023 0.5    Lead Channel Setting Pac* 05/14/2023 2.5    Zone Setting Status 05/14/2023 Active    Zone Setting Status 05/14/2023 Inactive    Zone Setting Status 05/14/2023 542706    Lead Channel Status 05/14/2023 NULL    Lead Channel Impedance V* 05/14/2023 510    Lead Channel Sensing Int* 05/14/2023 3.1    Lead Channel Pacing Thre* 05/14/2023 0.75    Lead Channel Pacing Thre* 05/14/2023 0.5    Lead Channel Status 05/14/2023 NULL    Lead Channel Impedance V* 05/14/2023 480    Lead Channel Sensing Int* 05/14/2023 12.0    Lead Channel Pacing Thre* 05/14/2023 0.75    Lead Channel Pacing Thre* 05/14/2023 0.5    HighPow Impedance 05/14/2023 100    HighPow Imped Status 05/14/2023  NULL    Battery Status 05/14/2023 MOS    Battery Remaining Longev* 05/14/2023 89    Battery Remaining Percen* 05/14/2023 91.0    Battery Voltage 05/14/2023 3.02    Brady Statistic RA Perce* 05/14/2023 82.0    [place] RV Perce* 05/14/2023 4.5    Brady Statistic AP VP Pe* 05/14/2023 4.5    Brady Statistic AS VP Pe* 05/14/2023 1.0    Brady Statistic AP VS Pe* 05/14/2023 81.0    Brady Statistic AS VS Pe* 05/14/2023  12.0   Office Visit on 05/09/2023  Component Date Value   SARS Coronavirus 2 Ag 05/09/2023 Negative   Office Visit on 03/07/2023  Component Date Value   Date Time Interrogation * 03/07/2023 [id number]    Pulse Generator Manufact* 03/07/2023 SJCR    Pulse Gen Model 03/07/2023 [id number] Gallant DR    Pulse Gen Serial Number 03/07/2023 [id number]    Clinic Name 03/07/2023 [facility]    Implantable Pulse Genera* 03/07/2023 Implantable Cardiac Defibulator    Implantable Pulse Genera* 03/07/2023 [id number]    Implantable Lead Manufac* 03/07/2023 OTHER    Implantable Lead Model 03/07/2023 [id number]/87 ULTIPACE    Implantable Lead Serial * 03/07/2023 [id number]    Implantable Lead Implant* 03/07/2023 [id number]    Implantable Lead Locatio* 03/07/2023 UNKNOWN    Implantable Lead Location 03/07/2023 [id number]    Implantable Lead Connect* 03/07/2023 093235    Implantable Lead Manufac* 03/07/2023 SJCR    Implantable Lead Model 03/07/2023 7122 [address]    Implantable Lead Serial * 03/07/2023 7122Q/65    Implantable Lead Implant* 03/07/2023 [id number]    Implantable Lead Locatio* 03/07/2023 UNKNOWN    Implantable Lead Location 03/07/2023 427062    Implantable Lead Connect* 03/07/2023 376283    Lead Channel Setting Sen* 03/07/2023 0.5    Lead Channel Setting Pac* 03/07/2023 2.0    Lead Channel Setting Pac* 03/07/2023 0.5    Lead Channel Setting Pac* 03/07/2023 2.5    Zone Setting Status 03/07/2023 Active    Zone Setting Status 03/07/2023 Inactive    Zone Setting Status  03/07/2023 151761    Lead Channel Impedance V* 03/07/2023 525.0    Lead Channel Sensing Int* 03/07/2023 4.7    Lead Channel Pacing Thre* 03/07/2023 0.75    Lead Channel Pacing Thre* 03/07/2023 0.5    Lead Channel Pacing Thre* 03/07/2023 0.75    Lead Channel Pacing Thre* 03/07/2023 0.5    Lead Channel Impedance V* 03/07/2023 500.0    Lead Channel Sensing Int* 03/07/2023 12.0    Lead Channel Pacing Thre* 03/07/2023 0.75    Lead Channel Pacing Thre* 03/07/2023 0.5    Lead Channel Pacing Thre* 03/07/2023 0.75    Lead Channel Pacing Thre* 03/07/2023 0.5    HighPow Impedance 03/07/2023 87.75    Battery Remaining Longev* 03/07/2023 96    Brady Statistic RA Perce* 03/07/2023 86.0    Brady Statistic RV Perce* 03/07/2023 4.3    Eval Rhythm 03/07/2023 AS/VS 66   Office Visit on 03/06/2023  Component Date Value   Pro B Natriuretic peptid* 03/06/2023 221.0 (H)    WBC 03/06/2023 8.5    RBC 03/06/2023 3.96 (L)    Hemoglobin 03/06/2023 12.9 (L)    HCT 03/06/2023 39.9    MCV 03/06/2023 100.7 (H)    MCHC 03/06/2023 32.3    RDW 03/06/2023 13.8    Platelets 03/06/2023 261.0    Neutrophils Relative % 03/06/2023 71.6    Lymphocytes Relative 03/06/2023 15.3    Monocytes Relative 03/06/2023 10.5    Eosinophils Relative 03/06/2023 1.9    Basophils Relative 03/06/2023 0.7    Neutro Abs 03/06/2023 6.1    Lymphs Abs 03/06/2023 1.3    Monocytes Absolute 03/06/2023 0.9    Eosinophils Absolute 03/06/2023 0.2    Basophils Absolute 03/06/2023 0.1    Sodium 03/06/2023 139    Potassium 03/06/2023 4.2    Chloride 03/06/2023 98    CO2 03/06/2023 30    Glucose, Bld 03/06/2023 121 (H)    BUN 03/06/2023 17    Creatinine, Ser  03/06/2023 1.10    Total Bilirubin 03/06/2023 0.7    Alkaline Phosphatase 03/06/2023 69    AST 03/06/2023 20    ALT 03/06/2023 27    Total Protein 03/06/2023 7.1    Albumin 03/06/2023 4.3    GFR 03/06/2023 72.34    Calcium 03/06/2023 9.6    Methylmalonic Acid, Quant 03/06/2023 301     Vitamin B-12 03/06/2023 297    Cholesterol 03/06/2023 170    Triglycerides 03/06/2023 315.0 (H)    HDL 03/06/2023 48.70    VLDL 03/06/2023 63.0 (H)    Total CHOL/HDL Ratio 03/06/2023 3    NonHDL 03/06/2023 121.09    Magnesium 03/06/2023 2.2    Phosphorus 03/06/2023 4.0    Path Review 03/06/2023     Total Protein 03/06/2023 6.9    Albumin ELP 03/06/2023 4.0    Alpha 1 03/06/2023 0.3    Alpha 2 03/06/2023 0.9    Beta Globulin 03/06/2023 0.6    Beta 2 03/06/2023 0.4    Gamma Globulin 03/06/2023 0.7 (L)    Abnormal Protein Band1 03/06/2023     SPE Interp. 03/06/2023     Iron 03/06/2023 141    TIBC 03/06/2023 391    %SAT 03/06/2023 36    Ferritin 03/06/2023 84    Direct LDL 03/06/2023 85.0   Office Visit on 02/27/2023  Component Date Value   Hemoglobin A1C 02/27/2023 5.6   Clinical Support on 02/12/2023  Component Date Value   Date Time Interrogation * 02/12/2023 [id number]    Pulse Generator Manufact* 02/12/2023 SJCR    Pulse Gen Model 02/12/2023 [id number] Gallant DR    Pulse Gen Serial Number 02/12/2023 [id number]    Clinic Name 02/12/2023 [facility]    Implantable Pulse Genera* 02/12/2023 Implantable Cardiac Defibulator    Implantable Pulse Genera* 02/12/2023 [id number]    Implantable Lead Manufac* 02/12/2023 OTHER    Implantable Lead Model 02/12/2023 NUU7253/66 ULTIPACE    Implantable Lead Serial * 02/12/2023 [id number]    Implantable Lead Implant* 02/12/2023 [id number]    Implantable Lead Locatio* 02/12/2023 UNKNOWN    Implantable Lead Location 02/12/2023 433295    Implantable Lead Connect* 02/12/2023 188416    Implantable Lead Manufac* 02/12/2023 SJCR    Implantable Lead Model 02/12/2023 7122 Durata    Implantable Lead Serial * 02/12/2023 7122Q/65    Implantable Lead Implant* 02/12/2023 [id number]    Implantable Lead Locatio* 02/12/2023 UNKNOWN    Implantable Lead Location 02/12/2023 109323    Implantable Lead Connect* 02/12/2023 557322    Lead Channel Setting  Sen* 02/12/2023 0.5    Lead Channel Setting Sen* 02/12/2023 Adaptive Sensing    Lead Channel Setting Pac* 02/12/2023 3.5    Lead Channel Setting Pac* 02/12/2023 0.5    Lead Channel Setting Pac* 02/12/2023 2.5    Zone Setting Status 02/12/2023 Active    Zone Setting Status 02/12/2023 Inactive    Zone Setting Status 02/12/2023 025427    Lead Channel Status 02/12/2023 NULL    Lead Channel Impedance V* 02/12/2023 490    Lead Channel Sensing Int* 02/12/2023 3.9    Lead Channel Pacing Thre* 02/12/2023 0.75    Lead Channel Pacing Thre* 02/12/2023 0.5    Lead Channel Status 02/12/2023 NULL    Lead Channel Impedance V* 02/12/2023 440    Lead Channel Sensing Int* 02/12/2023 12.0    Lead Channel Pacing Thre* 02/12/2023 0.75    Lead Channel Pacing Thre* 02/12/2023 0.5    HighPow Impedance 02/12/2023 86    HighPow Imped Status 02/12/2023  NULL    Battery Status 02/12/2023 MOS    Battery Remaining Longev* 02/12/2023 73    Battery Remaining Percen* 02/12/2023 93.0    Battery Voltage 02/12/2023 3.02    Brady Statistic RA Perce* 02/12/2023 87.0    Brady Statistic RV Perce* 02/12/2023 4.5    Brady Statistic AP VP Pe* 02/12/2023 4.5    Brady Statistic AS VP Pe* 02/12/2023 1.0    Brady Statistic AP VS Pe* 02/12/2023 84.0    Brady Statistic AS VS Pe* 02/12/2023 10.0   Clinical Support on 11/21/2022  Component Date Value   Date Time Interrogation * 11/21/2022 [id number]    Pulse Generator Manufact* 11/21/2022 SJCR    Pulse Gen Model 11/21/2022 [id number] Gallant DR    Pulse Gen Serial Number 11/21/2022 [id number]    Clinic Name 11/21/2022 [facility]    Implantable Pulse Genera* 11/21/2022 Implantable Cardiac Defibulator    Implantable Pulse Genera* 11/21/2022 [id number]    Implantable Lead Manufac* 11/21/2022 OTHER    Implantable Lead Model 11/21/2022 YQI3474/25 ULTIPACE    Implantable Lead Serial * 11/21/2022 [id number]    Implantable Lead Implant* 11/21/2022 [id number]    Implantable Lead  Locatio* 11/21/2022 UNKNOWN    Implantable Lead Location 11/21/2022 416606    Implantable Lead Connect* 11/21/2022 301601    Implantable Lead Manufac* 11/21/2022 SJCR    Implantable Lead Model 11/21/2022 7122 [address]    Implantable Lead Serial * 11/21/2022 7122Q/65    Implantable Lead Implant* 11/21/2022 [id number]    Implantable Lead Locatio* 11/21/2022 UNKNOWN    Implantable Lead Location 11/21/2022 322025    Implantable Lead Connect* 11/21/2022 427062    Lead Channel Setting Sen* 11/21/2022 0.5    Lead Channel Setting Pac* 11/21/2022 3.5    Lead Channel Setting Pac* 11/21/2022 0.5    Lead Channel Setting Pac* 11/21/2022 2.5    Zone Setting Status 11/21/2022 Active    Zone Setting Status 11/21/2022 Inactive    Zone Setting Status 11/21/2022 376283    Lead Channel Impedance V* 11/21/2022 475.0    Lead Channel Sensing Int* 11/21/2022 5.0    Lead Channel Pacing Thre* 11/21/2022 0.75    Lead Channel Pacing Thre* 11/21/2022 0.5    Lead Channel Pacing Thre* 11/21/2022 0.75    Lead Channel Pacing Thre* 11/21/2022 0.5    Lead Channel Impedance V* 11/21/2022 500.0    Lead Channel Sensing Int* 11/21/2022 12.0    Lead Channel Pacing Thre* 11/21/2022 0.75    Lead Channel Pacing Thre* 11/21/2022 0.5    Lead Channel Pacing Thre* 11/21/2022 0.75    Lead Channel Pacing Thre* 11/21/2022 0.5    HighPow Impedance 11/21/2022 67.5    Battery Remaining Longev* 11/21/2022 76    Brady Statistic RA Perce* 11/21/2022 86.0    Brady Statistic RV Perce* 11/21/2022 4.9    Eval Rhythm 11/21/2022 AS/VS 72   Admission on 11/11/2022, Discharged on 11/11/2022  Component Date Value   WBC 11/11/2022 9.5    RBC 11/11/2022 3.90 (L)    Hemoglobin 11/11/2022 13.0    HCT 11/11/2022 39.4    MCV 11/11/2022 101.0 (H)    MCH 11/11/2022 33.3    MCHC 11/11/2022 33.0    RDW 11/11/2022 12.4    Platelets 11/11/2022 252    nRBC 11/11/2022 0.0   Clinical Support on 10/29/2022  Component Date Value   Date Time  Interrogation * 10/29/2022 [id number]    Pulse Generator Manufact* 10/29/2022 SJCR    Pulse Gen Model 10/29/2022 1411-36Q Ellipse VR    Pulse  Gen Serial Number 10/29/2022 [id number]    Clinic Name 10/29/2022 [facility]    Implantable Pulse Genera* 10/29/2022 Implantable Cardiac Defibulator    Lead Channel Setting Sen* 10/29/2022 0.5    Lead Channel Setting Sen* 10/29/2022 Adaptive Sensing    Lead Channel Setting Pac* 10/29/2022 0.5    Lead Channel Setting Pac* 10/29/2022 2.5    Zone Setting Status 10/29/2022 Active    Zone Setting Status 10/29/2022 Inactive    Zone Setting Status 10/29/2022 755011    Lead Channel Status 10/29/2022 NULL    Lead Channel Impedance V* 10/29/2022 530    Lead Channel Sensing Int* 10/29/2022 11.8    Lead Channel Pacing Thre* 10/29/2022 0.75    Lead Channel Pacing Thre* 10/29/2022 0.5    HighPow Impedance 10/29/2022 95    HighPow Impedance 10/29/2022 95    HighPow Imped Status 10/29/2022 NULL    High[employer] Imped Status 10/29/2022 NULL    Battery Status 10/29/2022 MOS    Battery Remaining Longev* 10/29/2022 64    Battery Remaining Percen* 10/29/2022 62.0    Battery Voltage 10/29/2022 2.98    Brady Statistic RV Perce* 10/29/2022 1.0   Hospital Outpatient Visit on 10/23/2022  Component Date Value   Sodium 10/23/2022 134 (L)    Potassium 10/23/2022 4.0    Chloride 10/23/2022 95 (L)    CO2 10/23/2022 27    Glucose, Bld 10/23/2022 139 (H)    BUN 10/23/2022 13    Creatinine, Ser 10/23/2022 0.99    Calcium 10/23/2022 9.1    GFR, Estimated 10/23/2022 >60    Anion gap 10/23/2022 12    B Natriuretic Peptide 10/23/2022 625.0 (H)   There may be more visits with results that are not included.   No image results found.   CT Knee Right Wo Contrast  Result Date: 05/16/2023 CLINICAL DATA:  Knee pain, stress fracture suspected. Patient fell yesterday. Unable to bear weight. EXAM: CT OF THE RIGHT KNEE WITHOUT CONTRAST TECHNIQUE: Multidetector CT imaging of the  right knee was performed according to the standard protocol. Multiplanar CT image reconstructions were also generated. RADIATION DOSE REDUCTION: This exam was performed according to the departmental dose-optimization program which includes automated exposure control, adjustment of the mA and/or kV according to patient size and/or use of iterative reconstruction technique. COMPARISON:  Radiographs same date. FINDINGS: Bones/Joint/Cartilage No evidence of acute fracture or dislocation. The bones are demineralized. There are tricompartmental degenerative changes which are most advanced in the patellofemoral compartment. There is prominent subchondral cyst formation in the femoral trochlea. Medial compartment subchondral sclerosis is likely reactive. No evidence of lipohemarthrosis or significant knee joint effusion. Ligaments Suboptimally assessed by CT. The cruciate ligaments are grossly intact. Muscles and Tendons The quadriceps and patellar tendons are intact. The muscles about the knee appear unremarkable, without atrophy or hematoma. Soft tissues No evidence of periarticular hematoma, foreign body or soft tissue emphysema. Prominent popliteal and runoff vessel atherosclerosis. IMPRESSION: 1. No evidence of acute fracture or dislocation. 2. Tricompartmental degenerative changes, most advanced in the patellofemoral compartment. 3. No evidence of significant knee joint effusion or periarticular hematoma. 4. Prominent popliteal and runoff vessel atherosclerosis. Electronically Signed   By: Carey Bullocks M.D.   On: 05/16/2023 16:58   DG Foot Complete Right  Result Date: 05/16/2023 CLINICAL DATA:  Swelling injury. EXAM: RIGHT FOOT COMPLETE - 3 VIEW COMPARISON:  None Available. FINDINGS: Soft swelling is present over the dorsum of the foot. No acute osseous abnormality is present. Degenerative changes are present in  the midfoot. Mild osteopenia is present. IMPRESSION: Soft tissue swelling over the dorsum of the  foot without underlying fracture. Electronically Signed   By: Marin Roberts M.D.   On: 05/16/2023 15:35   DG Ankle Complete Right  Result Date: 05/16/2023 CLINICAL DATA:  Fall.  Right ankle pain/swelling. EXAM: RIGHT ANKLE - COMPLETE 3+ VIEW COMPARISON:  None Available. FINDINGS: No acute fracture or dislocation. No aggressive osseous lesion. Ankle mortise appears intact. Calcaneal spur noted along the Achilles tendon and Plantar aponeurosis attachment sites. There is mild asymmetric soft tissue swelling overlying the lateral malleolus without discrete underlying fracture. Findings are concerning for soft tissue/ligamentous injury. No radiopaque foreign bodies. IMPRESSION: 1. Lateral soft tissue swelling, concerning for underlying soft tissue/ligamentous injury. 2. No acute fracture or dislocation. Electronically Signed   By: Jules Schick M.D.   On: 05/16/2023 15:20   DG Knee Complete 4 Views Right  Result Date: 05/16/2023 CLINICAL DATA:  Fall.  Right knee pain/swelling. EXAM: RIGHT KNEE - COMPLETE 4+ VIEW COMPARISON:  None Available. FINDINGS: There is subtle breach in the articular surface of the lateral tibial condyle, marked with electronic arrow sign on image number 3. It is unclear whether this represents a true undisplaced fracture versus artifact. Correlate clinically to determine the need for additional imaging with CT scan. No other acute fracture or dislocation. No aggressive osseous lesion. Mild degenerative changes of the knee joint noted. No knee effusion or focal soft tissue swelling. No radiopaque foreign bodies. IMPRESSION: 1. Subtle breach in the articular surface of the lateral tibial condyle/tibial plateau. It is unclear whether this represents a true undisplaced fracture versus artifact. Correlate clinically to determine the need for additional imaging with CT scan. Electronically Signed   By: Jules Schick M.D.   On: 05/16/2023 15:19   CUP PACEART REMOTE DEVICE  CHECK  Result Date: 05/14/2023 Scheduled remote reviewed. Normal device function.  4 brief NSVT, no therapy 1 AHR, 16sec in duration 1 magnet response 8/14 @ 14:47 Next remote 91 days. LA, CVRS   CT Knee Right Wo Contrast  Result Date: 05/16/2023 CLINICAL DATA:  Knee pain, stress fracture suspected. Patient fell yesterday. Unable to bear weight. EXAM: CT OF THE RIGHT KNEE WITHOUT CONTRAST TECHNIQUE: Multidetector CT imaging of the right knee was performed according to the standard protocol. Multiplanar CT image reconstructions were also generated. RADIATION DOSE REDUCTION: This exam was performed according to the departmental dose-optimization program which includes automated exposure control, adjustment of the mA and/or kV according to patient size and/or use of iterative reconstruction technique. COMPARISON:  Radiographs same date. FINDINGS: Bones/Joint/Cartilage No evidence of acute fracture or dislocation. The bones are demineralized. There are tricompartmental degenerative changes which are most advanced in the patellofemoral compartment. There is prominent subchondral cyst formation in the femoral trochlea. Medial compartment subchondral sclerosis is likely reactive. No evidence of lipohemarthrosis or significant knee joint effusion. Ligaments Suboptimally assessed by CT. The cruciate ligaments are grossly intact. Muscles and Tendons The quadriceps and patellar tendons are intact. The muscles about the knee appear unremarkable, without atrophy or hematoma. Soft tissues No evidence of periarticular hematoma, foreign body or soft tissue emphysema. Prominent popliteal and runoff vessel atherosclerosis. IMPRESSION: 1. No evidence of acute fracture or dislocation. 2. Tricompartmental degenerative changes, most advanced in the patellofemoral compartment. 3. No evidence of significant knee joint effusion or periarticular hematoma. 4. Prominent popliteal and runoff vessel atherosclerosis. Electronically Signed    By: Carey Bullocks M.D.   On: 05/16/2023 16:58  DG Foot Complete Right  Result Date: 05/16/2023 CLINICAL DATA:  Swelling injury. EXAM: RIGHT FOOT COMPLETE - 3 VIEW COMPARISON:  None Available. FINDINGS: Soft swelling is present over the dorsum of the foot. No acute osseous abnormality is present. Degenerative changes are present in the midfoot. Mild osteopenia is present. IMPRESSION: Soft tissue swelling over the dorsum of the foot without underlying fracture. Electronically Signed   By: Marin Roberts M.D.   On: 05/16/2023 15:35   DG Ankle Complete Right  Result Date: 05/16/2023 CLINICAL DATA:  Fall.  Right ankle pain/swelling. EXAM: RIGHT ANKLE - COMPLETE 3+ VIEW COMPARISON:  None Available. FINDINGS: No acute fracture or dislocation. No aggressive osseous lesion. Ankle mortise appears intact. Calcaneal spur noted along the Achilles tendon and Plantar aponeurosis attachment sites. There is mild asymmetric soft tissue swelling overlying the lateral malleolus without discrete underlying fracture. Findings are concerning for soft tissue/ligamentous injury. No radiopaque foreign bodies. IMPRESSION: 1. Lateral soft tissue swelling, concerning for underlying soft tissue/ligamentous injury. 2. No acute fracture or dislocation. Electronically Signed   By: Jules Schick M.D.   On: 05/16/2023 15:20   DG Knee Complete 4 Views Right  Result Date: 05/16/2023 CLINICAL DATA:  Fall.  Right knee pain/swelling. EXAM: RIGHT KNEE - COMPLETE 4+ VIEW COMPARISON:  None Available. FINDINGS: There is subtle breach in the articular surface of the lateral tibial condyle, marked with electronic arrow sign on image number 3. It is unclear whether this represents a true undisplaced fracture versus artifact. Correlate clinically to determine the need for additional imaging with CT scan. No other acute fracture or dislocation. No aggressive osseous lesion. Mild degenerative changes of the knee joint noted. No knee effusion  or focal soft tissue swelling. No radiopaque foreign bodies. IMPRESSION: 1. Subtle breach in the articular surface of the lateral tibial condyle/tibial plateau. It is unclear whether this represents a true undisplaced fracture versus artifact. Correlate clinically to determine the need for additional imaging with CT scan. Electronically Signed   By: Jules Schick M.D.   On: 05/16/2023 15:19      Additional Info: This encounter employed real-time, collaborative documentation. The patient actively reviewed and updated their medical record on a shared screen, ensuring transparency and facilitating joint problem-solving for the problem list, overview, and plan. This approach promotes accurate, informed care. The treatment plan was discussed and reviewed in detail, including medication safety, potential side effects, and all patient questions. We confirmed understanding and comfort with the plan. Follow-up instructions were established, including contacting the office for any concerns, returning if symptoms worsen, persist, or new symptoms develop, and precautions for potential emergency department visits.

## 2023-07-22 NOTE — Patient Instructions (Signed)
VISIT SUMMARY:  Today, we addressed your recent gout flare in the right ankle, which has been causing severe pain and difficulty in walking. We also discussed your recent start on semaglutide for weight loss and its benefits.  YOUR PLAN:  -GOUT FLARE: Gout is a type of arthritis caused by the buildup of uric acid crystals in the joints, leading to severe pain and swelling. To manage your current flare, we administered Toradol and Solu-Medrol injections today. We also prescribed Percocet for pain relief, but please be cautious about driving and alcohol use while taking it. We will refer you to a podiatric specialist for a possible steroid injection. Once this flare resolves, we will start you on allopurinol to prevent future flares and check your uric acid levels in 1-2 weeks.   INSTRUCTIONS:  Please schedule a follow-up appointment with Dr. Durene Cal or myself in 1-2 weeks to check your uric acid levels and discuss your progress.

## 2023-07-28 ENCOUNTER — Ambulatory Visit: Payer: Commercial Managed Care - HMO | Admitting: Family Medicine

## 2023-07-29 ENCOUNTER — Ambulatory Visit: Payer: Managed Care, Other (non HMO)

## 2023-08-06 ENCOUNTER — Encounter: Payer: Self-pay | Admitting: Podiatry

## 2023-08-06 ENCOUNTER — Ambulatory Visit (INDEPENDENT_AMBULATORY_CARE_PROVIDER_SITE_OTHER): Payer: Managed Care, Other (non HMO)

## 2023-08-06 ENCOUNTER — Ambulatory Visit (INDEPENDENT_AMBULATORY_CARE_PROVIDER_SITE_OTHER): Payer: Managed Care, Other (non HMO) | Admitting: Podiatry

## 2023-08-06 DIAGNOSIS — M10071 Idiopathic gout, right ankle and foot: Secondary | ICD-10-CM

## 2023-08-06 DIAGNOSIS — M7751 Other enthesopathy of right foot: Secondary | ICD-10-CM | POA: Diagnosis not present

## 2023-08-06 DIAGNOSIS — M778 Other enthesopathies, not elsewhere classified: Secondary | ICD-10-CM | POA: Diagnosis not present

## 2023-08-06 MED ORDER — MELOXICAM 15 MG PO TABS: 15.0000 mg | ORAL_TABLET | Freq: Every day | ORAL | 0 refills | Status: DC

## 2023-08-06 MED ORDER — METHYLPREDNISOLONE 4 MG PO TBPK: ORAL_TABLET | ORAL | 0 refills | Status: DC

## 2023-08-06 NOTE — Progress Notes (Signed)
Subjective:  Patient ID: John [name], male    DOB: [birth year],  MRN: [id number]  Chief Complaint  Patient presents with   Foot Pain   Gout    RM#12 Patient states has a gout flare up in ankle unable to walk or stand on right foot X 4 days patient has been using tri lock brace .    62 y.o. male presents with the above complaint. Patient presents with acute complaint of right ankle pain with red hot swollen joint.  Patient states that it flared up for last 4 days is progressive gotten worse.  He has been using ankle brace is not able to walk.  He denies any other acute complaints he has not seen anyone else prior to seeing me denies any other acute issues.  Pain scale 7 out of 10 dull aching nature   Review of Systems: Negative except as noted in the HPI. Denies N/V/F/Ch.  Past Medical History:  Diagnosis Date   AICD (automatic cardioverter/defibrillator) present    [employer]   Anxiety    Ativan   Chronic combined systolic and diastolic CHF, NYHA class 3 (HCC) 10/2016   Nonischemic cardiomyopathy. EF 20-25%.   Chronic left hip pain    Depression    history of   Dysrhythmia    A-FIB   GI bleed 03/2016   Headache    Hypertensive heart disease with combined systolic and diastolic congestive heart failure (HCC) 10/2016   Nonischemic cardiomyopathy (HCC) 10/2016   Echo with EF 20-25%. Cardiac catheterization with no CAD. LVEDP was 41 mmHg, PCWP 36 mmHg   Osteoarthritis    right shoulder   Right hand fracture    Seasonal allergies    Sleep apnea    wears a CPAP   Wears glasses     Current Outpatient Medications:    amoxicillin (AMOXIL) 500 MG capsule, TAKE 4 CAPSULES ONE HOUR PRIOR TO DENTAL PROCEDURES, Disp: , Rfl:    azithromycin (ZITHROMAX) 250 MG tablet, Take 2 tabs day 1, then 1 tab daily, Disp: 6 each, Rfl: 0   benzonatate (TESSALON) 200 MG capsule, Take 1 capsule (200 mg total) by mouth 2 (two) times daily as needed for cough., Disp: 20 capsule, Rfl: 0   carvedilol  (COREG) 25 MG tablet, Take 0.5 tablets (12.5 mg total) by mouth every morning AND 1 tablet (25 mg total) every evening., Disp: 135 tablet, Rfl: 3   Cyanocobalamin (B-12) 1000 MCG CAPS, Take 1 tablet by mouth daily at 6 (six) AM., Disp: 90 capsule, Rfl: 3   dapagliflozin propanediol (FARXIGA) 10 MG TABS tablet, Take 1 tablet (10 mg total) by mouth daily. NEEDS FOLLOW UP APPOINTMENT FOR MORE REFILLS, Disp: 30 tablet, Rfl: 2   Dulaglutide (TRULICITY) 0.75 MG/0.5ML SOPN, Inject 0.75 mg into the skin once a week., Disp: 2 mL, Rfl: 0   Dulaglutide (TRULICITY) 1.5 MG/0.5ML SOPN, Inject 1.5 mg into the skin once a week., Disp: 2 mL, Rfl: 0   Dulaglutide (TRULICITY) 3 MG/0.5ML SOPN, Inject 3 mg as directed once a week., Disp: 2 mL, Rfl: 0   Dulaglutide (TRULICITY) 4.5 MG/0.5ML SOPN, Inject 4.5 mg as directed once a week., Disp: 2 mL, Rfl: 0   ELIQUIS 5 MG TABS tablet, TAKE 1 TABLET BY MOUTH TWICE A DAY, Disp: 60 tablet, Rfl: 11   famotidine-calcium carbonate-magnesium hydroxide (PEPCID COMPLETE) 10-800-165 MG chewable tablet, Chew 1 tablet by mouth 2 (two) times daily as needed., Disp: 100 tablet, Rfl: 11   ketorolac (TORADOL) 10  MG tablet, Take 1 tablet (10 mg total) by mouth every 6 (six) hours as needed., Disp: 20 tablet, Rfl: 0   meloxicam (MOBIC) 15 MG tablet, Take 1 tablet (15 mg total) by mouth daily., Disp: 30 tablet, Rfl: 0   methylPREDNISolone (MEDROL DOSEPAK) 4 MG TBPK tablet, Take as directed, Disp: 21 each, Rfl: 0   Multiple Vitamin (MULTIVITAMIN WITH MINERALS) TABS tablet, Take 1 tablet by mouth in the morning., Disp: , Rfl:    predniSONE (DELTASONE) 20 MG tablet, Take 2 pills for 3 days, 1 pill for 4 days, Disp: 10 tablet, Rfl: 0   rosuvastatin (CRESTOR) 20 MG tablet, TAKE 1 TABLET BY MOUTH EVERY DAY, Disp: 90 tablet, Rfl: 3   sacubitril-valsartan (ENTRESTO) 97-103 MG, Take 1 tablet by mouth 2 (two) times daily., Disp: 180 tablet, Rfl: 3   Semaglutide-Weight Management 0.5 MG/0.5ML SOAJ, Inject  0.5 mg into the skin once a week., Disp: , Rfl:    sertraline (ZOLOFT) 100 MG tablet, TAKE 1 TABLET BY MOUTH EVERY DAY, Disp: 90 tablet, Rfl: 3   sildenafil (VIAGRA) 100 MG tablet, TAKE 1 TABLET (100 MG TOTAL) BY MOUTH AT BEDTIME AS NEEDED FOR ERECTILE DYSFUNCTION., Disp: 90 tablet, Rfl: 3   spironolactone (ALDACTONE) 50 MG tablet, TAKE 1 TABLET BY MOUTH EVERY DAY (Patient taking differently: Take 25 mg by mouth daily.), Disp: 90 tablet, Rfl: 3   torsemide (DEMADEX) 20 MG tablet, TAKE 3 TABLETS BY MOUTH EVERY DAY, Disp: 90 tablet, Rfl: 5   traZODone (DESYREL) 50 MG tablet, Take 1 tablet (50 mg total) by mouth at bedtime., Disp: 90 tablet, Rfl: 3  Social History   Tobacco Use  Smoking Status Never  Smokeless Tobacco Never    Allergies  Allergen Reactions   Chlorhexidine Itching   Cymbalta [Duloxetine Hcl] Nausea Only and Other (See Comments)    Night sweats     Hydrocodone Rash   Objective:  There were no vitals filed for this visit. There is no height or weight on file to calculate BMI. Constitutional Well developed. Well nourished.  Vascular Dorsalis pedis pulses palpable bilaterally. Posterior tibial pulses palpable bilaterally. Capillary refill normal to all digits.  No cyanosis or clubbing noted. Pedal hair growth normal.  Neurologic Normal speech. Oriented to person, place, and time. Epicritic sensation to light touch grossly present bilaterally.  Dermatologic Nails well groomed and normal in appearance. No open wounds. No skin lesions.  Orthopedic: Pain on palpation to the right ankle pain with range of motion of the ankle right hallux swollen joint noted tenderness on palpation.  No open wounds or lesion noted.   Radiographs: 3 views of skeletally mature right foot osteoarthritis noted of the anterior ankle.  Small avulsion/bone spur noted on the dorsal head of the talus.  Mild midfoot arthritis noted no other bony abnormalities identified Assessment:   1. Capsulitis  of right ankle   2. Acute idiopathic gout of right ankle    Plan:  Patient was evaluated and treated and all questions answered.  Right ankle capsulitis with underlying gout flare -All questions and concerns were discussed with the patient in extensive detail -I discussed diet management for gout.  Overall he does tend to manage it well with gout diet. A- given the amount of pain that he is having he will benefit from a steroid injection help decrease inflammatory component associate with pain.  Patient agrees with plan like to proceed with steroid injection -A steroid injection was performed at right ankle joint using  1% plain Lidocaine and 10 mg of Kenalog. This was well tolerated. -Medrol Dosepak andMeloxicam was sent to the pharmacy for acute inflammation No follow-ups on file.

## 2023-08-08 ENCOUNTER — Ambulatory Visit: Payer: Managed Care, Other (non HMO) | Admitting: Podiatry

## 2023-08-12 ENCOUNTER — Telehealth: Payer: Self-pay | Admitting: Family Medicine

## 2023-08-12 NOTE — Telephone Encounter (Signed)
PT requests Labs for Ozempic. Please place Orders.

## 2023-08-12 NOTE — Telephone Encounter (Signed)
Called and spoke with pt and labs printed and placed up front for pt to pick up.

## 2023-08-13 ENCOUNTER — Ambulatory Visit: Payer: Commercial Managed Care - HMO | Attending: Internal Medicine

## 2023-08-13 ENCOUNTER — Encounter: Payer: Self-pay | Admitting: Cardiovascular Disease

## 2023-08-13 ENCOUNTER — Ambulatory Visit: Payer: Commercial Managed Care - HMO | Admitting: Cardiovascular Disease

## 2023-08-13 VITALS — BP 102/64 | HR 74 | Ht 71.0 in | Wt 271.0 lb

## 2023-08-13 DIAGNOSIS — Z95810 Presence of automatic (implantable) cardiac defibrillator: Secondary | ICD-10-CM

## 2023-08-13 DIAGNOSIS — Z5971 Insufficient health insurance coverage: Secondary | ICD-10-CM

## 2023-08-13 DIAGNOSIS — I5042 Chronic combined systolic (congestive) and diastolic (congestive) heart failure: Secondary | ICD-10-CM | POA: Diagnosis not present

## 2023-08-13 DIAGNOSIS — I2510 Atherosclerotic heart disease of native coronary artery without angina pectoris: Secondary | ICD-10-CM

## 2023-08-13 DIAGNOSIS — E782 Mixed hyperlipidemia: Secondary | ICD-10-CM

## 2023-08-13 DIAGNOSIS — D6869 Other thrombophilia: Secondary | ICD-10-CM

## 2023-08-13 DIAGNOSIS — G4733 Obstructive sleep apnea (adult) (pediatric): Secondary | ICD-10-CM

## 2023-08-13 DIAGNOSIS — I480 Paroxysmal atrial fibrillation: Secondary | ICD-10-CM | POA: Diagnosis not present

## 2023-08-13 DIAGNOSIS — I428 Other cardiomyopathies: Secondary | ICD-10-CM | POA: Diagnosis not present

## 2023-08-13 DIAGNOSIS — I472 Ventricular tachycardia, unspecified: Secondary | ICD-10-CM | POA: Diagnosis not present

## 2023-08-13 LAB — CUP PACEART REMOTE DEVICE CHECK
Battery Remaining Longevity: 87 mo
Battery Remaining Percentage: 88 %
Battery Voltage: 3.01 V
Brady Statistic AP VP Percent: 5.2 %
Brady Statistic AP VS Percent: 80 %
Brady Statistic AS VP Percent: 1 %
Brady Statistic AS VS Percent: 13 %
Brady Statistic RA Percent Paced: 82 %
Brady Statistic RV Percent Paced: 5.2 %
Date Time Interrogation Session: 20241127010538
HighPow Impedance: 100 Ohm
Implantable Lead Connection Status: 753985
Implantable Lead Connection Status: 753985
Implantable Lead Implant Date: 20190821
Implantable Lead Implant Date: 20240226
Implantable Lead Location: 753859
Implantable Lead Location: 753860
Implantable Lead Model: 7122
Implantable Pulse Generator Implant Date: 20240226
Lead Channel Impedance Value: 460 Ohm
Lead Channel Impedance Value: 510 Ohm
Lead Channel Pacing Threshold Amplitude: 0.75 V
Lead Channel Pacing Threshold Amplitude: 0.75 V
Lead Channel Pacing Threshold Pulse Width: 0.5 ms
Lead Channel Pacing Threshold Pulse Width: 0.5 ms
Lead Channel Sensing Intrinsic Amplitude: 12 mV
Lead Channel Sensing Intrinsic Amplitude: 3.6 mV
Lead Channel Setting Pacing Amplitude: 2 V
Lead Channel Setting Pacing Amplitude: 2.5 V
Lead Channel Setting Pacing Pulse Width: 0.5 ms
Lead Channel Setting Sensing Sensitivity: 0.5 mV
Pulse Gen Serial Number: 211012451
Zone Setting Status: 755011

## 2023-08-13 NOTE — Progress Notes (Signed)
Cardiology Office Note:    Date:  08/13/2023   ID:  John [name], DOB 08-09-1961, MRN [id number]  PCP:  Shelva Majestic, MD  Cardiologist:  Darrel Reach (CHF)  Referring MD: Shelva Majestic, MD   Chief Complaint  Patient presents with   Congestive Heart Failure     History of Present Illness:    John [name] is a 62 y.o. male with a hx of severe nonischemic cardiomyopathy (with left ventricular ejection fraction as low as 15-20%, most recently estimated to be 30-35 % by an echo 01/22/2022) , paroxysmal atrial fibrillation, polymorphic ventricular tachycardia, aortic atherosclerosis, moderate nonobstructive CAD with severe plaque burden (coronary CTA 10/30/2022 calcium score 1750, 98th percentile; 50-69% stenoses in proximal LAD, first diagonal, 25-49% stenosis in mid LCx, OM 2, AV groove artery, mid RCA; none of the lesions significant by FFR), obstructive sleep apnea on CPAP and severe obesity.  He is generally doing well and has not had any defibrillator discharges since he underwent upgrade to a dual-chamber device in February 2024.  He has occasional mild orthostatic dizziness, but has not had any syncopal events.  His defibrillator has recorded 8 episodes of Nonsustained ventricular tachycardia since his last office appointment with Dr. Lalla Brothers in June.  Most of these are monomorphic VT in the "monitor zone", one of them was fairly long at 25 seconds (October 11).  As far as I can tell all of them are asymptomatic.  A couple of the episodes look like polymorphic VT in the VF zone.  These were brief and occurred back in August.  He has started treatment with semaglutide and he reports that he has lost 10 pounds in the first month (according to our scale he is only lost about 4 pounds).  He feels that his cravings for food and also for alcohol have diminished.  He still drinks 3-4 beers daily.  He has not had problems with orthopnea, PND or lower extremity edema.  He feels  tired, but he has not been using CPAP.  He is not engaging in regular physical activity.  He does not have shortness of breath with climbing stairs or household chores.  He has not had any problems with chest pain.  He has not had any falls or serious bleeding problems on Eliquis.  ICD interrogation shows normal device function.  He is programmed to a lower rate limit of 70 bpm to try to prevent polymorphic pause mediated VT.  He has 82% atrial pacing and only 4% ventricular pacing.  Lead parameters are all excellent.  Estimated generator longevity is 7-7.6 years.  There have been no episodes of atrial fibrillation, although he has had some brief bursts of atrial tachycardia.  His thoracic impedance (corvue) has been briefly out of range at the end of October, but has normalized since.  He had an appropriate defibrillator shock for polymorphic ventricular tachycardia 226 bpm on 07/21/2022.  Several other brief episodes of nonsustained polymorphic tachycardia were seen, all of them seemed to have a pause mediated onset.    Coronary CT angiography 10/30/2022 did not show evidence of significant coronary stenoses, although he did have moderate stenoses in the proximal LAD and first diagonal arteries (50 to 69%) and mild stenoses in the mid circumflex, OM 2, mid RCA (25-49%) and the calcium score was markedly elevated at 1750 (98th percentile for age and gender).  None of the coronary lesions were significant by FFR.  Aortic atherosclerosis and a normal caliber aorta was incidentally noted.  He underwent successful elective cardioversion for persistent atrial fibrillation 10/07/2022.  He underwent upgrade of his ICD to a dual-chamber device to allow atrial pacing 11/11/2022 (Dr. Lalla Brothers, [facility], new atrial lead LPA 1231, old RV ICD lead Durata 7122Q).  Since adding the atrial lead he has not had any significant polymorphic VT.  He has a history of a lumbar vertebroplasty and right total hip repair by  Dr. Linna Caprice and Dr. Shon Baton respectively.  No cardiac issues during those surgeries.    Past Medical History:  Diagnosis Date   AICD (automatic cardioverter/defibrillator) present    [employer]   Anxiety    Ativan   Chronic combined systolic and diastolic CHF, NYHA class 3 (HCC) 10/2016   Nonischemic cardiomyopathy. EF 20-25%.   Chronic left hip pain    Depression    history of   Dysrhythmia    A-FIB   GI bleed 03/2016   Headache    Hypertensive heart disease with combined systolic and diastolic congestive heart failure (HCC) 10/2016   Nonischemic cardiomyopathy (HCC) 10/2016   Echo with EF 20-25%. Cardiac catheterization with no CAD. LVEDP was 41 mmHg, PCWP 36 mmHg   Osteoarthritis    right shoulder   Right hand fracture    Seasonal allergies    Sleep apnea    wears a CPAP   Wears glasses     Past Surgical History:  Procedure Laterality Date   CARDIAC CATHETERIZATION     CARDIOVERSION N/A 09/01/2019   Procedure: CARDIOVERSION;  Surgeon: Laurey Morale, MD;  Location: [facility] ENDOSCOPY;  Service: Cardiovascular;  Laterality: N/A;   CARDIOVERSION N/A 10/07/2022   Procedure: CARDIOVERSION;  Surgeon: Thomasene Ripple, DO;  Location: [facility] ENDOSCOPY;  Service: Cardiovascular;  Laterality: N/A;   ESOPHAGOGASTRODUODENOSCOPY (EGD) WITH PROPOFOL N/A 03/20/2016   Procedure: ESOPHAGOGASTRODUODENOSCOPY (EGD) WITH PROPOFOL;  Surgeon: Charlott Rakes, MD;  Location: [facility] ENDOSCOPY;  Service: Endoscopy;  Laterality: N/A;   ICD IMPLANT N/A 05/06/2018   Procedure: ICD IMPLANT;  Surgeon: Thurmon Fair, MD;  Location: [facility] INVASIVE CV LAB;  Service: Cardiovascular;  Laterality: N/A;   IRRIGATION AND DEBRIDEMENT SHOULDER Left 11/11/2019   Procedure: LEFT SHOULDER IRRIGATION AND DEBRIDEMENT WITH POLY EXCHANGE;  Surgeon: Jones Broom, MD;  Location: [facility] ORS;  Service: Orthopedics;  Laterality: Left;   KYPHOPLASTY N/A 02/22/2021   Procedure: KYPHOPLASTY T12;  Surgeon: Venita Lick, MD;  Location: [facility] OR;   Service: Orthopedics;  Laterality: N/A;  90 mins   LEAD REVISION/REPAIR N/A 11/11/2022   Procedure: LEAD atrial lead insertion;  Surgeon: Lanier Prude, MD;  Location: [facility] INVASIVE CV LAB;  Service: Cardiovascular;  Laterality: N/A;   ORIF WRIST FRACTURE Left 02/04/2022   Procedure: OPEN REDUCTION INTERNAL FIXATION (ORIF) WRIST FRACTURE;  Surgeon: Bradly Bienenstock, MD;  Location: [facility] OR;  Service: Orthopedics;  Laterality: Left;  with IV sedation   PPM GENERATOR CHANGEOUT N/A 11/11/2022   Procedure: PPM GENERATOR CHANGEOUT;  Surgeon: Lanier Prude, MD;  Location: [facility] INVASIVE CV LAB;  Service: Cardiovascular;  Laterality: N/A;   RIGHT/LEFT HEART CATH AND CORONARY ANGIOGRAPHY N/A 10/21/2016   Procedure: Right/Left Heart Cath and Coronary Angiography;  Surgeon: Runell Gess, MD;  Location: [facility] INVASIVE CV LAB;  Service: Cardiovascular: Angiographically normal coronary arteries. PCWP 33-36 mmHg, LVEDP 41 mmHg. PA pressure 60/35, mean 46 mmHg.  Cardiac output/cardiac index-3.93 /1.76 Hiram Comber), 3.49/1.57 (thermodilution)   TEE WITHOUT CARDIOVERSION N/A 09/01/2019   Procedure: TRANSESOPHAGEAL ECHOCARDIOGRAM (TEE);  Surgeon: Laurey Morale, MD;  Location: [facility] ENDOSCOPY;  Service: Cardiovascular;  Laterality: N/A;   TOTAL HIP ARTHROPLASTY Left 03/27/2015   Procedure: LEFT TOTAL HIP ARTHROPLASTY ANTERIOR APPROACH;  Surgeon: Samson Frederic, MD;  Location: [facility] OR;  Service: Orthopedics;  Laterality: Left;   TOTAL HIP ARTHROPLASTY Right 05/03/2021   Procedure: TOTAL HIP ARTHROPLASTY ANTERIOR APPROACH;  Surgeon: Samson Frederic, MD;  Location: [facility] ORS;  Service: Orthopedics;  Laterality: Right;   TOTAL SHOULDER ARTHROPLASTY Right 11/24/2015   Procedure: RIGHT TOTAL SHOULDER ARTHROPLASTY;  Surgeon: Beverely Low, MD;  Location[facility] OR;  Service: Orthopedics;  Laterality: Right;   TOTAL SHOULDER ARTHROPLASTY Left 10/19/2019   Procedure: REVERSE TOTAL SHOULDER ARTHROPLASTY;  Surgeon: Jones Broom, MD;  Location: [facility]  ORS;  Service: Orthopedics;  Laterality: Left;   TRANSTHORACIC ECHOCARDIOGRAM  10/2016   EF 20-25%. Diffuse hypokinesis but akinesis of the entire inferoseptal wall and apical wall. Moderate biatrial enlargement. PA pressure estimated 64 mmHg.   WISDOM TOOTH EXTRACTION      Current Medications: Current Meds  Medication Sig   carvedilol (COREG) 25 MG tablet Take 0.5 tablets (12.5 mg total) by mouth every morning AND 1 tablet (25 mg total) every evening.   dapagliflozin propanediol (FARXIGA) 10 MG TABS tablet Take 1 tablet (10 mg total) by mouth daily. NEEDS FOLLOW UP APPOINTMENT FOR MORE REFILLS   Dulaglutide (TRULICITY) 3 MG/0.5ML SOPN Inject 3 mg as directed once a week.   ELIQUIS 5 MG TABS tablet TAKE 1 TABLET BY MOUTH TWICE A DAY   Multiple Vitamin (MULTIVITAMIN WITH MINERALS) TABS tablet Take 1 tablet by mouth in the morning.   rosuvastatin (CRESTOR) 20 MG tablet TAKE 1 TABLET BY MOUTH EVERY DAY   sacubitril-valsartan (ENTRESTO) 97-103 MG Take 1 tablet by mouth 2 (two) times daily.   sertraline (ZOLOFT) 100 MG tablet TAKE 1 TABLET BY MOUTH EVERY DAY   sildenafil (VIAGRA) 100 MG tablet TAKE 1 TABLET (100 MG TOTAL) BY MOUTH AT BEDTIME AS NEEDED FOR ERECTILE DYSFUNCTION.   spironolactone (ALDACTONE) 50 MG tablet TAKE 1 TABLET BY MOUTH EVERY DAY (Patient taking differently: Take 25 mg by mouth daily.)   torsemide (DEMADEX) 20 MG tablet TAKE 3 TABLETS BY MOUTH EVERY DAY   traZODone (DESYREL) 50 MG tablet Take 1 tablet (50 mg total) by mouth at bedtime.     Allergies:   Chlorhexidine, Cymbalta [duloxetine hcl], and Hydrocodone   Social History   Socioeconomic History   Marital status: Married    Spouse name: Not on file   Number of children: 3   Years of education: Not on file   Highest education level: Not on file  Occupational History   Not on file  Tobacco Use   Smoking status: Never   Smokeless tobacco: Never  Vaping Use   Vaping status: Never Used  Substance and Sexual  Activity   Alcohol use: Yes    Alcohol/week: 12.0 standard drinks of alcohol    Types: 12 Cans of beer per week    Comment: per week   Drug use: No   Sexual activity: Yes    Partners: Female    Comment: gf only  Other Topics Concern   Not on file  Social History Narrative   Engaged 2022- long term GF/lives with him. 2 dogs. Close with 3 sons that work for wife- christopher 15 at [employer], [place] 21, [place] 25 in 2021.       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    Grew up  in [place].    Social Determinants of Health   Financial Resource Strain: Not on file  Food Insecurity: Not on file  Transportation Needs: Not on file  Physical Activity: Not on file  Stress: Not on file  Social Connections: Unknown (01/27/2022)   Received from [facility], [facility]   Social Network    Social Network: Not on file     Family History: The patient's family history includes Congenital heart disease in his sister; Dementia in his mother; Healthy in his brother, brother, brother, and sister; Lung cancer in his father; Prostate cancer in his brother.  His son also had a "hole in his heart" there was repaired surgically.  ROS:   Please see the history of present illness.    All other systems are reviewed and are negative  EKGs/Labs/Other Studies Reviewed:    The following studies were reviewed today: Notes from last heart failure visit with heart failure service from May 2022  EKG:  EKG is not ordered today.  The intracardiac electrogram shows atrial paced, ventricular sensed rhythm.  The underlying sinus rate is about 50 bpm.  The most recent tracing from 03/07/2023 was personally reviewed and shows sinus rhythm, generalized low voltage QRS, otherwise normal.   Recent Labs: 08/22/2022: TSH 1.63 10/23/2022: B Natriuretic Peptide 625.0 03/06/2023: ALT 27; BUN 17; Creatinine, Ser 1.10; Hemoglobin 12.9;  Magnesium 2.2; Platelets 261.0; Potassium 4.2; Pro B Natriuretic peptide (BNP) 221.0; Sodium 139  Recent Lipid Panel    Component Value Date/Time   CHOL 170 03/06/2023 1538   CHOL 111 06/09/2018 1137   TRIG 315.0 (H) 03/06/2023 1538   HDL 48.70 03/06/2023 1538   HDL 53 06/09/2018 1137   CHOLHDL 3 03/06/2023 1538   VLDL 63.0 (H) 03/06/2023 1538   LDLCALC 48 08/22/2022 1225   LDLCALC 71 08/24/2020 1004   LDLDIRECT 85.0 03/06/2023 1538    Physical Exam:    VS:  BP 102/64 (BP Location: Left Arm, Patient Position: Sitting, Cuff Size: Large)   Pulse 74   Ht 5\' 11"  (1.803 m)   Wt 271 lb (122.9 kg)   SpO2 96%   BMI 37.80 kg/m     Wt Readings from Last 3 Encounters:  08/13/23 271 lb (122.9 kg)  05/09/23 275 lb 12.8 oz (125.1 kg)  03/14/23 270 lb 12.8 oz (122.8 kg)     General: Alert, oriented x3, no distress, severely obese.  Well-healed left subclavian ICD site Head: no evidence of trauma, PERRL, EOMI, no exophtalmos or lid lag, no myxedema, no xanthelasma; normal ears, nose and oropharynx Neck: normal jugular venous pulsations and no hepatojugular reflux; brisk carotid pulses without delay and no carotid bruits Chest: clear to auscultation, no signs of consolidation by percussion or palpation, normal fremitus, symmetrical and full respiratory excursions Cardiovascular: normal position and quality of the apical impulse, regular rhythm, normal first and second heart sounds, no murmurs, rubs or gallops Abdomen: no tenderness or distention, no masses by palpation, no abnormal pulsatility or arterial bruits, normal bowel sounds, no hepatosplenomegaly Extremities: no clubbing, cyanosis or edema; 2+ radial, ulnar and brachial pulses bilaterally; 2+ right femoral, posterior tibial and dorsalis pedis pulses; 2+ left femoral, posterior tibial and dorsalis pedis pulses; no subclavian or femoral bruits Neurological: grossly nonfocal Psych: Normal mood and affect      ASSESSMENT:    1. VT  (ventricular tachycardia) (HCC)   2. Paroxysmal atrial fibrillation (HCC)   3. Acquired thrombophilia (HCC)   4. Chronic combined systolic (congestive)  and diastolic (congestive) heart failure (HCC)   5. Coronary artery disease involving native coronary artery of native heart without angina pectoris   6. ICD (implantable cardioverter-defibrillator) in place   7. Severe obesity (BMI 35.0-39.9) with comorbidity (HCC)   8. Mixed hyperlipidemia   9. OSA (obstructive sleep apnea)   10. Insurance coverage problems        PLAN:    In order of problems listed above:  VT: After upgrading his ICD to a dual-chamber device and avoiding pauses, the incidence of polymorphic VT appears to be much lower, although there have been a couple of brief events.  These were asymptomatic and did not require ICD intervention.  She continues to have some spells of monomorphic VT, thankfully also asymptomatic and without need for shocks.  He is not on true antiarrhythmics other than the carvedilol.  Unable to increase the carvedilol further due to symptoms of orthostatic hypotension.  Starting amiodarone does not appear warranted at this time. AFib: Infrequent episodes of atrial fibrillation requiring cardioversion once in 2020 and once in early 2024.  CHA2DS2-VASc 4 (CHF, CAD, HTN).  No meaningful atrial arrhythmia since device upgrade in February 2024. Anticoagulation: No bleeding problems on Eliquis. CHF: Appears clinically euvolemic and his thoracic impedance has been stable on the ICD.  NYHA functional class II.  He is on comprehensive guideline directed medical therapy on pretty high doses of beta-blocker, Entresto, spironolactone as well as an SGLT2 inhibitor.  He does not require loop diuretics.  Most recent LVEF 30-35% with global hypokinesis.  Although he has mild coronary disease, he has primarily nonischemic cardiomyopathy. The MRI raised suspicion for prior myocarditis, but quite possibly at least in part  related to alcohol consumption.  We once again reviewed the need to avoid sodium rich foods such as deli meats and canned foods.  He would like to be reengaged in cardiac rehab.  Will make the referral and I hope that his insurance plan will cover it. CAD: He does not have angina pectoris and although he has very extensive coronary atherosclerosis, thankfully he does not have any severe stenoses by CTA/FFR.  Focus remains on risk factor modification.  He is not on aspirin due to full anticoagulation with Eliquis. ICD: normal device function.  Continue remote downloads every 3 months. Obesity: Hopefully lose some weight with semaglutide.  Advised him to limit his intake of alcohol and carbohydrates.  Discussed the difference between saturated fat and unsaturated fat and reviewed the concept of the glycemic index. HLP: His most recent profile showed cholesterol components in target range, but moderate hypertriglyceridemia (315).  Weight loss, reducing carbohydrate and alcohol consumption and regular exercise should be beneficial.  I do not think we should add another pharmaceutical agent. OSA: She has recently not been compliant with CPAP.  I think this may be one of the reasons he has less energy.  Strongly encourage him to use CPAP every night and pointed out that untreated sleep apnea worsens congestive heart failure and arrhythmia risk. ED: OK to take PDE5 inhibitors but never combine with nitrates. Prefer shorter acting sildenafil. Social work support: He asked about qualifying for [employer].  It is my understanding that after 2 years on Social Security/disability he will qualify for Medicare.  This will be coming up next year.  Will ask our social worker to help him navigate the transition.  Pointed out that he needs to be careful when signing up for Medicare part D since he takes several expensive medications  that are critically important for heart failure management.   Medication Adjustments/Labs and  Tests Ordered: Current medicines are reviewed at length with the patient today.  Concerns regarding medicines are outlined above.  Orders Placed This Encounter  Procedures   Referral to HRT/VAS Care Navigation   AMB referral to cardiac rehabilitation   ECHOCARDIOGRAM COMPLETE    No orders of the defined types were placed in this encounter.    Patient Instructions  Medication Instructions:  No changes *If you need a refill on your cardiac medications before your next appointment, please call your pharmacy*   Testing/Procedures: Your physician has requested that you have an echocardiogram. Echocardiography is a painless test that uses sound waves to create images of your heart. It provides your doctor with information about the size and shape of your heart and how well your heart's chambers and valves are working. This procedure takes approximately one hour. There are no restrictions for this procedure. Please do NOT wear cologne, perfume, aftershave, or lotions (deodorant is allowed). Please arrive 15 minutes prior to your appointment time.  Please note: We ask at that you not bring children with you during ultrasound (echo/ vascular) testing. Due to room size and safety concerns, children are not allowed in the ultrasound rooms during exams. Our front office staff cannot provide observation of children in our lobby area while testing is being conducted. An adult accompanying a patient to their appointment will only be allowed in the ultrasound room at the discretion of the ultrasound technician under special circumstances. We apologize for any inconvenience.    Follow-Up: At [facility], you and your health needs are our priority.  As part of our continuing mission to provide you with exceptional heart care, we have created designated Provider Care Teams.  These Care Teams include your primary Cardiologist (physician) and Advanced Practice Providers (APPs -  Physician Assistants  and Nurse Practitioners) who all work together to provide you with the care you need, when you need it.  We recommend signing up for the patient portal called "MyChart".  Sign up information is provided on this After Visit Summary.  MyChart is used to connect with patients for Virtual Visits (Telemedicine).  Patients are able to view lab/test results, encounter notes, upcoming appointments, etc.  Non-urgent messages can be sent to your provider as well.   To learn more about what you can do with MyChart, go to [email or web address].    Your next appointment:   6 month(s)  Provider:   Thurmon Fair, MD       Signed, Thurmon Fair, MD  08/13/2023 11:34 AM    [facility]

## 2023-08-13 NOTE — Patient Instructions (Signed)
Medication Instructions:  No changes *If you need a refill on your cardiac medications before your next appointment, please call your pharmacy*   Testing/Procedures: Your physician has requested that you have an echocardiogram. Echocardiography is a painless test that uses sound waves to create images of your heart. It provides your doctor with information about the size and shape of your heart and how well your heart's chambers and valves are working. This procedure takes approximately one hour. There are no restrictions for this procedure. Please do NOT wear cologne, perfume, aftershave, or lotions (deodorant is allowed). Please arrive 15 minutes prior to your appointment time.  Please note: We ask at that you not bring children with you during ultrasound (echo/ vascular) testing. Due to room size and safety concerns, children are not allowed in the ultrasound rooms during exams. Our front office staff cannot provide observation of children in our lobby area while testing is being conducted. An adult accompanying a patient to their appointment will only be allowed in the ultrasound room at the discretion of the ultrasound technician under special circumstances. We apologize for any inconvenience.    Follow-Up: At [facility], you and your health needs are our priority.  As part of our continuing mission to provide you with exceptional heart care, we have created designated Provider Care Teams.  These Care Teams include your primary Cardiologist (physician) and Advanced Practice Providers (APPs -  Physician Assistants and Nurse Practitioners) who all work together to provide you with the care you need, when you need it.  We recommend signing up for the patient portal called "MyChart".  Sign up information is provided on this After Visit Summary.  MyChart is used to connect with patients for Virtual Visits (Telemedicine).  Patients are able to view lab/test results, encounter notes,  upcoming appointments, etc.  Non-urgent messages can be sent to your provider as well.   To learn more about what you can do with MyChart, go to [email or web address].    Your next appointment:   6 month(s)  Provider:   Thurmon Fair, MD

## 2023-08-18 ENCOUNTER — Telehealth: Payer: Self-pay | Admitting: Licensed Clinical Social Worker

## 2023-08-18 NOTE — Telephone Encounter (Signed)
H&V Care Navigation CSW Progress Note  Clinical Social Worker contacted patient by phone to f/u on referral to discuss Medicare enrollment, appears pt may be eligible next year since he has received Medicare benefits for almost 24 months. I left voicemail for pt this morning at [phone]. Will re-attempt again to ensure he has information about [employer] which can guide the process when eligible.  Patient is participating in a Managed Medicaid Plan:  No, [employer] commercial only  SDOH Screenings   Depression (PHQ2-9): Low Risk  (05/09/2023)  Social Connections: Unknown (01/27/2022)   Received from [facility], [facility]  Tobacco Use: Low Risk  (08/13/2023)    Octavio Graves, MSW, LCSW Clinical Social Worker II [facility] Heart/Vascular Care Navigation  [phone]- work cell phone (preferred) [phone]- desk phone

## 2023-08-18 NOTE — Telephone Encounter (Signed)
H&V Care Navigation CSW Progress Note  Clinical Social Worker  received a call back from pt wife Carmi Osterkamp at [phone], no DPR on chart so I had pt come on the phone to give verbal permission and reminded them to complete next appt . Pt and pt wife had spoken with [employer] marketplace who pt had enrolled in [employer] plan with. When they went to fill medication there was a challenge and [employer] team member was able to see pt has had Medicare benefits since July but per wife's report they havent received a card yet. Are able to see something noted on pt [employer] profile. Advised pt and pt wife to call [facility] team to [facility], provided the number and encouraged them to call today. Pt wife understands what the program is and how to go about reaching them. If no return call in 48 hours I requested the pt/pt wife let me know and I can reach out to [facility] team myself. No additional questions/concerns at this time.   Patient is participating in a Managed Medicaid Plan:  No, [employer] commercial (and possibly traditional Medicare)  SDOH Screenings   Depression (PHQ2-9): Low Risk  (05/09/2023)  Social Connections: Unknown (01/27/2022)   Received from [facility], [facility]  Tobacco Use: Low Risk  (08/13/2023)  Health Literacy: Adequate Health Literacy (08/18/2023)    Octavio Graves, MSW, LCSW Clinical Social Worker II [facility] Heart/Vascular Care Navigation  [phone]- work cell phone (preferred) [phone]- desk phone

## 2023-08-19 ENCOUNTER — Telehealth (HOSPITAL_COMMUNITY): Payer: Self-pay

## 2023-08-19 ENCOUNTER — Telehealth: Payer: Self-pay | Admitting: Cardiovascular Disease

## 2023-08-19 MED ORDER — DAPAGLIFLOZIN PROPANEDIOL 10 MG PO TABS: 10.0000 mg | ORAL_TABLET | Freq: Every day | ORAL | Status: DC

## 2023-08-19 NOTE — Telephone Encounter (Signed)
Attempted to call pt in regards to Cardiac rehab. LM on VM

## 2023-08-19 NOTE — Telephone Encounter (Signed)
Pt insurance is active and benefits verified through [facility] $0, DED $3500/$500 met, out of pocket $9450/$3050 met, co-insurance 50%. no pre-authorization required. Kim E./[employer], 08/19/2023@1108  REF# [id number]   How many CR sessions are covered? (36 visits for TCR, TCR ONLY Is this a lifetime maximum or an annual maximum? Annual Has the member used any of these services to date? no Is there a time limit (weeks/months) on start of program and/or program completion? no     Will contact patient to see if he is interested in the Cardiac Rehab Program. If interested, patient will need to complete follow up appt. Once completed, patient will be contacted for scheduling upon review by the RN Navigator.

## 2023-08-19 NOTE — Telephone Encounter (Signed)
 Office Referral received. Verified MD signature. Will pass to nurse navigator for review.

## 2023-08-19 NOTE — Telephone Encounter (Signed)
Patient calling the office for samples of medication:   1.  What medication and dosage are you requesting samples for?  dapagliflozin propanediol (FARXIGA) 10 MG TABS tablet   2.  Are you currently out of this medication?   Patient/Wife stated patient is out of this medication.  Wife stated they are unable to get medication pending insurance approval. Wife wants samples to last until the issue is resolved.

## 2023-08-19 NOTE — Telephone Encounter (Signed)
Called patient to see if he is interested in the Cardiac Rehab Program. Patient expressed interest. Explained scheduling process and went over insurance, patient verbalized understanding. Someone from our cardiac rehab staff will contact pt at a later time.

## 2023-08-19 NOTE — Telephone Encounter (Signed)
Spoke with pt's wife, Lynden Ang, regarding samples of farxiga. Able to provide pt with 2 weeks of samples. Medication placed at front desk for pick up. Wife verbalizes understanding.

## 2023-08-22 ENCOUNTER — Encounter (HOSPITAL_COMMUNITY): Payer: Self-pay

## 2023-08-22 ENCOUNTER — Telehealth: Payer: Self-pay | Admitting: Cardiovascular Disease

## 2023-08-22 ENCOUNTER — Ambulatory Visit (INDEPENDENT_AMBULATORY_CARE_PROVIDER_SITE_OTHER): Payer: Commercial Managed Care - HMO | Admitting: Internal Medicine

## 2023-08-22 ENCOUNTER — Encounter: Payer: Self-pay | Admitting: Internal Medicine

## 2023-08-22 ENCOUNTER — Telehealth: Payer: Self-pay | Admitting: Family Medicine

## 2023-08-22 ENCOUNTER — Telehealth (HOSPITAL_COMMUNITY): Payer: Self-pay

## 2023-08-22 VITALS — BP 112/72 | HR 68 | Temp 97.6°F | Wt 266.4 lb

## 2023-08-22 DIAGNOSIS — J4550 Severe persistent asthma, uncomplicated: Secondary | ICD-10-CM

## 2023-08-22 DIAGNOSIS — I428 Other cardiomyopathies: Secondary | ICD-10-CM

## 2023-08-22 DIAGNOSIS — I252 Old myocardial infarction: Secondary | ICD-10-CM

## 2023-08-22 DIAGNOSIS — Z79899 Other long term (current) drug therapy: Secondary | ICD-10-CM

## 2023-08-22 DIAGNOSIS — Z4502 Encounter for adjustment and management of automatic implantable cardiac defibrillator: Secondary | ICD-10-CM

## 2023-08-22 LAB — LIPID PANEL
Cholesterol: 140 mg/dL (ref 0–200)
HDL: 42.9 mg/dL (ref >39.00)
LDL Cholesterol: 67 mg/dL (ref 0–99)
NonHDL: 97.09
Total CHOL/HDL Ratio: 3
Triglycerides: 148 mg/dL (ref 0.0–149.0)
VLDL: 29.6 mg/dL (ref 0.0–40.0)

## 2023-08-22 LAB — COMPREHENSIVE METABOLIC PANEL
ALT: 25 U/L (ref 0–53)
AST: 23 U/L (ref 0–37)
Albumin: 4.2 g/dL (ref 3.5–5.2)
Alkaline Phosphatase: 53 U/L (ref 39–117)
BUN: 13 mg/dL (ref 6–23)
CO2: 31 meq/L (ref 19–32)
Calcium: 9.4 mg/dL (ref 8.4–10.5)
Chloride: 97 meq/L (ref 96–112)
Creatinine, Ser: 1.11 mg/dL (ref 0.40–1.50)
GFR: 71.33 mL/min (ref >60.00)
Glucose, Bld: 107 mg/dL — ABNORMAL HIGH (ref 70–99)
Potassium: 3.8 meq/L (ref 3.5–5.1)
Sodium: 135 meq/L (ref 135–145)
Total Bilirubin: 0.5 mg/dL (ref 0.2–1.2)
Total Protein: 7.2 g/dL (ref 6.0–8.3)

## 2023-08-22 LAB — CBC WITH DIFFERENTIAL/PLATELET
Basophils Absolute: 0 10*3/uL (ref 0.0–0.1)
Basophils Relative: 0.7 % (ref 0.0–3.0)
Eosinophils Absolute: 0.3 10*3/uL (ref 0.0–0.7)
Eosinophils Relative: 5.3 % — ABNORMAL HIGH (ref 0.0–5.0)
HCT: 39.8 % (ref 39.0–52.0)
Hemoglobin: 13.9 g/dL (ref 13.0–17.0)
Lymphocytes Relative: 15.9 % (ref 12.0–46.0)
Lymphs Abs: 1 10*3/uL (ref 0.7–4.0)
MCHC: 35 g/dL (ref 30.0–36.0)
MCV: 100.9 fL — ABNORMAL HIGH (ref 78.0–100.0)
Monocytes Absolute: 1 10*3/uL (ref 0.1–1.0)
Monocytes Relative: 16.7 % — ABNORMAL HIGH (ref 3.0–12.0)
Neutro Abs: 3.8 10*3/uL (ref 1.4–7.7)
Neutrophils Relative %: 61.4 % (ref 43.0–77.0)
Platelets: 239 10*3/uL (ref 150.0–400.0)
RBC: 3.94 Mil/uL — ABNORMAL LOW (ref 4.22–5.81)
RDW: 13.3 % (ref 11.5–15.5)
WBC: 6.1 10*3/uL (ref 4.0–10.5)

## 2023-08-22 LAB — BRAIN NATRIURETIC PEPTIDE: Pro B Natriuretic peptide (BNP): 286 pg/mL — ABNORMAL HIGH (ref 0.0–100.0)

## 2023-08-22 MED ORDER — IPRATROPIUM-ALBUTEROL 0.5-2.5 (3) MG/3ML IN SOLN: 3.0000 mL | RESPIRATORY_TRACT | 1 refills | Status: DC | PRN

## 2023-08-22 MED ORDER — ALBUTEROL SULFATE HFA 108 (90 BASE) MCG/ACT IN AERS
2.0000 | INHALATION_SPRAY | Freq: Four times a day (QID) | RESPIRATORY_TRACT | 2 refills | Status: DC | PRN
Start: 2023-08-22 — End: 2023-12-02

## 2023-08-22 MED ORDER — PREDNISONE 20 MG PO TABS
ORAL_TABLET | ORAL | 0 refills | Status: DC
Start: 2023-08-22 — End: 2023-09-09

## 2023-08-22 MED ORDER — AZITHROMYCIN 250 MG PO TABS
ORAL_TABLET | ORAL | 0 refills | Status: AC
Start: 2023-08-22 — End: 2023-08-27

## 2023-08-22 MED ORDER — METHYLPREDNISOLONE ACETATE 80 MG/ML IJ SUSP
80.0000 mg | Freq: Once | INTRAMUSCULAR | Status: AC
Start: 2023-08-22 — End: 2023-08-22
  Administered 2023-08-22: 80 mg via INTRAMUSCULAR

## 2023-08-22 NOTE — Telephone Encounter (Signed)
Wife Lynden Ang) wants a call back to clarify notes from patient's last visit.

## 2023-08-22 NOTE — Telephone Encounter (Signed)
Attempted to call patient in regards to Cardiac Rehab - LM on VM Mailed letter 

## 2023-08-22 NOTE — Telephone Encounter (Signed)
Called and spoke to patient and wife. She had questions about his last visit and what was report. They were arguing about what was said during the visit. I advised her that I could not comment on what he told patient during visit if it wasn't in the note. She asked if Dr Royann Shivers told patient "he's lived his time and he was dying" I told her I did not see that in the note. No further questions at this time.

## 2023-08-22 NOTE — Progress Notes (Unsigned)
[facility]: [phone]   -- Medical Office Visit --  Patient:  John [name]      Age: 62 y.o.       Sex:  male  Date:   08/22/2023 Today's Healthcare Provider: Lula Olszewski, MD  ==========================================================================    Pre-visit plan: played hocket at high level. right shoulder was done in past 2021 had left shoulder done and had septic arthritis.  ***had picc line for septic shoulder- didng go well with his job. looking out of other options.   *** needs PSA  #CHF systolic with 25% EF in 2022 and diastolic with cardiomyopathy-related to prior likely viral myocarditis in 2018-alcohol use likely contributes # Ventricular tachycardia with ICD in place-follows with Dr. Lalla Brothers and Dr. Jomarie Longs #HTN S: Medication:***Carvedilol 12.5 mg in the morning and 25 mg in the evening, Entresto 97-103 mg, Farxiga 10 mg, spironolactone 50 mg, torsemide 40 mg daily***   Edema: *** Weight gain:*** Shortness of breath: *** Orthopnea/PND: ***  A/P: CHF-***  Hypertension-***   # Atrial fibrillation-required cardioversion 10/05/2022 with ICD with dual-chamber device to allow pacing S: Rate controlled with *** coreg 12.5 and 25 mg in evening Anticoagulated with eliquis 5 mg twice daily  A/P: ***      CAD-nonobstructive #hyperlipidemia # Aortic atherosclerosis S: Medication:***Rosuvastatin 20 mg daily A/P: ***    # Hyperglycemia/insulin resistance/prediabetes-A1c as high as 6.4 05/03/21 S:  Medication: ***Farxiga mainly for heart failure Exercise and diet- *** A/P: ***  # Anxiety S: Medication: Sertraline 100 mg*** - Suicidal ideation-***  A/P: ***   # OSA-compliant with CPAP but still having trouble with sleep. Trazodone helpful in past- wants to try again- refill today . Asks about Ambien but need more sustained remisison from alcohol first and not as good of quality of sleep***   Assessment &  Plan Severe persistent asthmatic bronchitis without complication  History of non-ST elevation myocardial infarction (NSTEMI)  Non-ischemic cardiomyopathy (HCC)  AICD discharge      Orders Placed During this Encounter:   ED Discharge Orders     None     There are no diagnoses linked to this encounter. Recommended follow-up: No follow-ups on file.   SUBJECTIVE: 61 y.o. male who has Degenerative joint disease of left hip; S/P shoulder replacement; GI bleed; Alcohol dependence in remission (HCC); Non-ischemic cardiomyopathy (HCC); Essential hypertension; Hyperlipidemia; Allergic rhinitis; ED (erectile dysfunction); Hyperglycemia; Insomnia; OSA (obstructive sleep apnea); Unspecified fracture of upper end of left humerus, initial encounter for closed fracture; Status post reverse total shoulder replacement, left; Paroxysmal atrial fibrillation (HCC); Chronic combined systolic and diastolic CHF, NYHA class 3 (HCC); S/P ICD (internal cardiac defibrillator) procedure; GAD (generalized anxiety disorder); Hyponatremia; Thoracic compression fracture (HCC); Aortic atherosclerosis (HCC); Osteoarthritis of right hip; Osteoarthritis of left hip; Avascular necrosis of left humeral head (HCC); Closed fracture of distal end of left radius; Left shoulder pain; Lumbar spondylosis; Major depression, recurrent, full remission (HCC); Retained orthopedic hardware; VT (ventricular tachycardia) (HCC); CHF (congestive heart failure) (HCC); Other fatigue; Morbid obesity due to excess calories (HCC); History of non-ST elevation myocardial infarction (NSTEMI); CAD (coronary artery disease); AICD discharge; Mild episode of recurrent major depressive disorder (HCC); and Macrocytosis without anemia on their problem list.  Main reasons for visit/main concerns/chief complaint: Possible bronchitis and Shortness of Breath    AI-Extracted: Discussed the use of AI scribe software for clinical note transcription with the patient,  who gave verbal consent to proceed.  History of Present Illness  Note that patient  has a past medical history of AICD (automatic cardioverter/defibrillator) present, Anxiety, Chronic combined systolic and diastolic CHF, NYHA class 3 (HCC) (10/2016), Chronic left hip pain, Depression, Dysrhythmia, GI bleed (03/2016), Headache, Hypertensive heart disease with combined systolic and diastolic congestive heart failure (HCC) (10/2016), Nonischemic cardiomyopathy (HCC) (10/2016), Osteoarthritis, Right hand fracture, Seasonal allergies, Sleep apnea, and Wears glasses.  Problem list overviews that were updated at today's visit:No problems updated.  Med reconciliation: Current Outpatient Medications on File Prior to Visit  Medication Sig  . benzonatate (TESSALON) 200 MG capsule Take 1 capsule (200 mg total) by mouth 2 (two) times daily as needed for cough.  . carvedilol (COREG) 25 MG tablet Take 0.5 tablets (12.5 mg total) by mouth every morning AND 1 tablet (25 mg total) every evening.  . Cyanocobalamin (B-12) 1000 MCG CAPS Take 1 tablet by mouth daily at 6 (six) AM.  . dapagliflozin propanediol (FARXIGA) 10 MG TABS tablet Take 1 tablet (10 mg total) by mouth daily. NEEDS FOLLOW UP APPOINTMENT FOR MORE REFILLS  . Dulaglutide (TRULICITY) 0.75 MG/0.5ML SOPN Inject 0.75 mg into the skin once a week.  . Dulaglutide (TRULICITY) 1.5 MG/0.5ML SOPN Inject 1.5 mg into the skin once a week.  . Dulaglutide (TRULICITY) 3 MG/0.5ML SOPN Inject 3 mg as directed once a week.  . Dulaglutide (TRULICITY) 4.5 MG/0.5ML SOPN Inject 4.5 mg as directed once a week.  Marland Kitchen ELIQUIS 5 MG TABS tablet TAKE 1 TABLET BY MOUTH TWICE A DAY  . famotidine-calcium carbonate-magnesium hydroxide (PEPCID COMPLETE) 10-800-165 MG chewable tablet Chew 1 tablet by mouth 2 (two) times daily as needed.  Marland Kitchen ketorolac (TORADOL) 10 MG tablet Take 1 tablet (10 mg total) by mouth every 6 (six) hours as needed.  . meloxicam (MOBIC) 15 MG tablet  Take 1 tablet (15 mg total) by mouth daily.  . methylPREDNISolone (MEDROL DOSEPAK) 4 MG TBPK tablet Take as directed  . Multiple Vitamin (MULTIVITAMIN WITH MINERALS) TABS tablet Take 1 tablet by mouth in the morning.  . predniSONE (DELTASONE) 20 MG tablet Take 2 pills for 3 days, 1 pill for 4 days  . rosuvastatin (CRESTOR) 20 MG tablet TAKE 1 TABLET BY MOUTH EVERY DAY  . sacubitril-valsartan (ENTRESTO) 97-103 MG Take 1 tablet by mouth 2 (two) times daily.  . Semaglutide-Weight Management 0.5 MG/0.5ML SOAJ Inject 0.5 mg into the skin once a week.  . sertraline (ZOLOFT) 100 MG tablet TAKE 1 TABLET BY MOUTH EVERY DAY  . sildenafil (VIAGRA) 100 MG tablet TAKE 1 TABLET (100 MG TOTAL) BY MOUTH AT BEDTIME AS NEEDED FOR ERECTILE DYSFUNCTION.  Marland Kitchen spironolactone (ALDACTONE) 50 MG tablet TAKE 1 TABLET BY MOUTH EVERY DAY (Patient taking differently: Take 25 mg by mouth daily.)  . torsemide (DEMADEX) 20 MG tablet TAKE 3 TABLETS BY MOUTH EVERY DAY  . traZODone (DESYREL) 50 MG tablet Take 1 tablet (50 mg total) by mouth at bedtime.   No current facility-administered medications on file prior to visit.  There are no discontinued medications.   Objective   Physical Exam     08/22/2023   11:42 AM 08/22/2023   11:37 AM 08/13/2023   10:52 AM  Vitals with BMI  Height   5\' 11"   Weight  266 lbs 6 oz 271 lbs  BMI  37.17 37.81  Systolic 112 124 811  Diastolic 72 56 64  Pulse  68 74   Wt Readings from Last 10 Encounters:  08/22/23 266 lb 6.4 oz (120.8 kg)  08/13/23 271  lb (122.9 kg)  05/09/23 275 lb 12.8 oz (125.1 kg)  03/14/23 270 lb 12.8 oz (122.8 kg)  03/07/23 274 lb (124.3 kg)  03/06/23 269 lb 12.8 oz (122.4 kg)  02/27/23 277 lb 9.6 oz (125.9 kg)  12/09/22 272 lb (123.4 kg)  11/11/22 270 lb (122.5 kg)  10/25/22 276 lb 9.6 oz (125.5 kg)   Vital signs reviewed.  Nursing notes reviewed. Weight trend reviewed. Abnormalities and Problem-Specific physical exam findings:  ***  General Appearance:  No  acute distress appreciable.   Well-groomed, healthy-appearing male.  Well proportioned with no abnormal fat distribution.  Good muscle tone. Pulmonary:  Normal work of breathing at rest, no respiratory distress apparent. SpO2: 100 %  Musculoskeletal: All extremities are intact.  Neurological:  Awake, alert, oriented, and engaged.  No obvious focal neurological deficits or cognitive impairments.  Sensorium seems unclouded.   Speech is clear and coherent with logical content. Psychiatric:  Appropriate mood, pleasant and cooperative demeanor, thoughtful and engaged during the exam  Results         {Insert previous labs (optional):23779} {See past labs  Heme  Chem  Endocrine  Serology  Results Review (optional):1}  No results found for any visits on 08/22/23.  Appointment on 08/13/2023  Component Date Value  . Date Time Interrogation * 08/13/2023 [id number]   . Pulse Generator Manufact* 08/13/2023 SJCR   . Pulse Gen Model 08/13/2023 [id number] Gallant DR   . Pulse Gen Serial Number 08/13/2023 [id number]   . Clinic Name 08/13/2023 [facility]   . Implantable Pulse Genera* 08/13/2023 Implantable Cardiac Defibulator   . Implantable Pulse Genera* 08/13/2023 [id number]   . Implantable Lead Manufac* 08/13/2023 OTHER   . Implantable Lead Model 08/13/2023 [id number]/56 ULTIPACE   . Implantable Lead Serial * 08/13/2023 [id number]   . Implantable Lead Implant* 08/13/2023 [id number]   . Implantable Lead Locatio* 08/13/2023 UNKNOWN   . Implantable Lead Location 08/13/2023 [id number]   . Implantable Lead Connect* 08/13/2023 601093   . Implantable Lead Manufac* 08/13/2023 SJCR   . Implantable Lead Model 08/13/2023 7122 Durata   . Implantable Lead Serial * 08/13/2023 7122Q/65   . Implantable Lead Implant* 08/13/2023 [id number]   . Implantable Lead Locatio* 08/13/2023 UNKNOWN   . Implantable Lead Location 08/13/2023 025427   . Implantable Lead Connect* 08/13/2023 062376   . Lead Channel Setting Sen*  08/13/2023 0.5   . Lead Channel Setting Sen* 08/13/2023 Adaptive Sensing   . Lead Channel Setting Pac* 08/13/2023 2.0   . Lead Channel Setting Pac* 08/13/2023 0.5   . Lead Channel Setting Pac* 08/13/2023 2.5   . Zone Setting Status 08/13/2023 Active   . Zone Setting Status 08/13/2023 Inactive   . Zone Setting Status 08/13/2023 755011   . Lead Channel Status 08/13/2023 NULL   . Lead Channel Impedance V* 08/13/2023 510   . Lead Channel Sensing Int* 08/13/2023 3.6   . Lead Channel Pacing Thre* 08/13/2023 0.75   . Lead Channel Pacing Thre* 08/13/2023 0.5   . Lead Channel Status 08/13/2023 NULL   . Lead Channel Impedance V* 08/13/2023 460   . Lead Channel Sensing Int* 08/13/2023 12.0   . Lead Channel Pacing Thre* 08/13/2023 0.75   . Lead Channel Pacing Thre* 08/13/2023 0.5   . HighPow Impedance 08/13/2023 100   . HighPow Imped Status 08/13/2023 NULL   . Battery Status 08/13/2023 MOS   . Battery Remaining Longev* 08/13/2023 87   . Battery Remaining Percen* 08/13/2023 88.0   . Battery Voltage 08/13/2023 3.01   .  Huston Foley Statistic RA Perce* 08/13/2023 82.0   . Brady Statistic RV Perce* 08/13/2023 5.2   . Brady Statistic AP VP Pe* 08/13/2023 5.2   . Brady Statistic AS VP Pe* 08/13/2023 1.0   . Brady Statistic AP VS Pe* 08/13/2023 80.0   . Brady Statistic AS VS Pe* 08/13/2023 13.0   Appointment on 05/14/2023  Component Date Value  . Date Time Interrogation * 05/14/2023 [id number]   . Pulse Generator Manufact* 05/14/2023 SJCR   . Pulse Gen Model 05/14/2023 [id number] Gallant DR   . Pulse Gen Serial Number 05/14/2023 [id number]   . Clinic Name 05/14/2023 [facility]   . Implantable Pulse Genera* 05/14/2023 Implantable Cardiac Defibulator   . Implantable Pulse Genera* 05/14/2023 [id number]   . Implantable Lead Manufac* 05/14/2023 OTHER   . Implantable Lead Model 05/14/2023 KGU5427/06 ULTIPACE   . Implantable Lead Serial * 05/14/2023 [id number]   . Implantable Lead Implant* 05/14/2023  [id number]   . Implantable Lead Locatio* 05/14/2023 UNKNOWN   . Implantable Lead Location 05/14/2023 710626   . Implantable Lead Connect* 05/14/2023 [id number]   . Implantable Lead Manufac* 05/14/2023 SJCR   . Implantable Lead Model 05/14/2023 7122 Durata   . Implantable Lead Serial * 05/14/2023 7122Q/65   . Implantable Lead Implant* 05/14/2023 [id number]   . Implantable Lead Locatio* 05/14/2023 UNKNOWN   . Implantable Lead Location 05/14/2023 381829   . Implantable Lead Connect* 05/14/2023 [id number]   . Lead Channel Setting Sen* 05/14/2023 0.5   . Lead Channel Setting Sen* 05/14/2023 Adaptive Sensing   . Lead Channel Setting Pac* 05/14/2023 2.0   . Lead Channel Setting Pac* 05/14/2023 0.5   . Lead Channel Setting Pac* 05/14/2023 2.5   . Zone Setting Status 05/14/2023 Active   . Zone Setting Status 05/14/2023 Inactive   . Zone Setting Status 05/14/2023 755011   . Lead Channel Status 05/14/2023 NULL   . Lead Channel Impedance V* 05/14/2023 510   . Lead Channel Sensing Int* 05/14/2023 3.1   . Lead Channel Pacing Thre* 05/14/2023 0.75   . Lead Channel Pacing Thre* 05/14/2023 0.5   . Lead Channel Status 05/14/2023 NULL   . Lead Channel Impedance V* 05/14/2023 480   . Lead Channel Sensing Int* 05/14/2023 12.0   . Lead Channel Pacing Thre* 05/14/2023 0.75   . Lead Channel Pacing Thre* 05/14/2023 0.5   . HighPow Impedance 05/14/2023 100   . HighPow Imped Status 05/14/2023 NULL   . Battery Status 05/14/2023 MOS   . Battery Remaining Longev* 05/14/2023 89   . Battery Remaining Percen* 05/14/2023 91.0   . Battery Voltage 05/14/2023 3.02   . Brady Statistic RA Perce* 05/14/2023 82.0   . Brady Statistic RV Perce* 05/14/2023 4.5   . Brady Statistic AP VP Pe* 05/14/2023 4.5   . Brady Statistic AS VP Pe* 05/14/2023 1.0   . Brady Statistic AP VS Pe* 05/14/2023 81.0   . Huston Foley Statistic AS VS Pe* 05/14/2023 12.0   Office Visit on 05/09/2023  Component Date Value  . SARS Coronavirus 2 Ag 05/09/2023  Negative   Office Visit on 03/07/2023  Component Date Value  . Date Time Interrogation * 03/07/2023 [id number]   . Pulse Generator Manufact* 03/07/2023 SJCR   . Pulse Gen Model 03/07/2023 [id number] Gallant DR   . Pulse Gen Serial Number 03/07/2023 [id number]   . Clinic Name 03/07/2023 [employer]   . Implantable Pulse Genera* 03/07/2023 Implantable Cardiac Defibulator   . Implantable Pulse Genera* 03/07/2023 [id number]   . Implantable Lead Manufac* 03/07/2023  OTHER   . Implantable Lead Model 03/07/2023 MVH8469/62 ULTIPACE   . Implantable Lead Serial * 03/07/2023 [id number]   . Implantable Lead Implant* 03/07/2023 [id number]   . Implantable Lead Locatio* 03/07/2023 UNKNOWN   . Implantable Lead Location 03/07/2023 366440   . Implantable Lead Connect* 03/07/2023 [id number]   . Implantable Lead Manufac* 03/07/2023 SJCR   . Implantable Lead Model 03/07/2023 7122 Durata   . Implantable Lead Serial * 03/07/2023 7122Q/65   . Implantable Lead Implant* 03/07/2023 [id number]   . Implantable Lead Locatio* 03/07/2023 UNKNOWN   . Implantable Lead Location 03/07/2023 433295   . Implantable Lead Connect* 03/07/2023 188416   . Lead Channel Setting Sen* 03/07/2023 0.5   . Lead Channel Setting Pac* 03/07/2023 2.0   . Lead Channel Setting Pac* 03/07/2023 0.5   . Lead Channel Setting Pac* 03/07/2023 2.5   . Zone Setting Status 03/07/2023 Active   . Zone Setting Status 03/07/2023 Inactive   . Zone Setting Status 03/07/2023 755011   . Lead Channel Impedance V* 03/07/2023 525.0   . Lead Channel Sensing Int* 03/07/2023 4.7   . Lead Channel Pacing Thre* 03/07/2023 0.75   . Lead Channel Pacing Thre* 03/07/2023 0.5   . Lead Channel Pacing Thre* 03/07/2023 0.75   . Lead Channel Pacing Thre* 03/07/2023 0.5   . Lead Channel Impedance V* 03/07/2023 500.0   . Lead Channel Sensing Int* 03/07/2023 12.0   . Lead Channel Pacing Thre* 03/07/2023 0.75   . Lead Channel Pacing Thre* 03/07/2023 0.5   . Lead Channel  Pacing Thre* 03/07/2023 0.75   . Lead Channel Pacing Thre* 03/07/2023 0.5   . HighPow Impedance 03/07/2023 87.75   . Battery Remaining Longev* 03/07/2023 96   . Huston Foley Statistic RA Perce* 03/07/2023 86.0   . Brady Statistic RV Perce* 03/07/2023 4.3   . Eval Rhythm 03/07/2023 AS/VS 66   Office Visit on 03/06/2023  Component Date Value  . Pro B Natriuretic peptid* 03/06/2023 221.0 (H)   . WBC 03/06/2023 8.5   . RBC 03/06/2023 3.96 (L)   . Hemoglobin 03/06/2023 12.9 (L)   . HCT 03/06/2023 39.9   . MCV 03/06/2023 100.7 (H)   . MCHC 03/06/2023 32.3   . RDW 03/06/2023 13.8   . Platelets 03/06/2023 261.0   . Neutrophils Relative % 03/06/2023 71.6   . Lymphocytes Relative 03/06/2023 15.3   . Monocytes Relative 03/06/2023 10.5   . Eosinophils Relative 03/06/2023 1.9   . Basophils Relative 03/06/2023 0.7   . Neutro Abs 03/06/2023 6.1   . Lymphs Abs 03/06/2023 1.3   . Monocytes Absolute 03/06/2023 0.9   . Eosinophils Absolute 03/06/2023 0.2   . Basophils Absolute 03/06/2023 0.1   . Sodium 03/06/2023 139   . Potassium 03/06/2023 4.2   . Chloride 03/06/2023 98   . CO2 03/06/2023 30   . Glucose, Bld 03/06/2023 121 (H)   . BUN 03/06/2023 17   . Creatinine, Ser 03/06/2023 1.10   . Total Bilirubin 03/06/2023 0.7   . Alkaline Phosphatase 03/06/2023 69   . AST 03/06/2023 20   . ALT 03/06/2023 27   . Total Protein 03/06/2023 7.1   . Albumin 03/06/2023 4.3   . GFR 03/06/2023 72.34   . Calcium 03/06/2023 9.6   . Methylmalonic Acid, Quant 03/06/2023 301   . Vitamin B-12 03/06/2023 297   . Cholesterol 03/06/2023 170   . Triglycerides 03/06/2023 315.0 (H)   . HDL 03/06/2023 48.70   . VLDL 03/06/2023 63.0 (H)   .  Total CHOL/HDL Ratio 03/06/2023 3   . NonHDL 03/06/2023 121.09   . Magnesium 03/06/2023 2.2   . Phosphorus 03/06/2023 4.0   . Path Review 03/06/2023    . Total Protein 03/06/2023 6.9   . Albumin ELP 03/06/2023 4.0   . Alpha 1 03/06/2023 0.3   . Alpha 2 03/06/2023 0.9   . Beta  Globulin 03/06/2023 0.6   . Beta 2 03/06/2023 0.4   . Gamma Globulin 03/06/2023 0.7 (L)   . Abnormal Protein Band1 03/06/2023    . SPE Interp. 03/06/2023    . Iron 03/06/2023 141   . TIBC 03/06/2023 391   . %SAT 03/06/2023 36   . Ferritin 03/06/2023 84   . Direct LDL 03/06/2023 85.0   Office Visit on 02/27/2023  Component Date Value  . Hemoglobin A1C 02/27/2023 5.6   Clinical Support on 02/12/2023  Component Date Value  . Date Time Interrogation * 02/12/2023 [id number]   . Pulse Generator Manufact* 02/12/2023 SJCR   . Pulse Gen Model 02/12/2023 [id number] Gallant DR   . Pulse Gen Serial Number 02/12/2023 [id number]   . Clinic Name 02/12/2023 [facility]   . Implantable Pulse Genera* 02/12/2023 Implantable Cardiac Defibulator   . Implantable Pulse Genera* 02/12/2023 [id number]   . Implantable Lead Manufac* 02/12/2023 OTHER   . Implantable Lead Model 02/12/2023 MWU1324/40 ULTIPACE   . Implantable Lead Serial * 02/12/2023 [id number]   . Implantable Lead Implant* 02/12/2023 [id number]   . Implantable Lead Locatio* 02/12/2023 UNKNOWN   . Implantable Lead Location 02/12/2023 [id number]   . Implantable Lead Connect* 02/12/2023 756433   . Implantable Lead Manufac* 02/12/2023 SJCR   . Implantable Lead Model 02/12/2023 7122 Durata   . Implantable Lead Serial * 02/12/2023 7122Q/65   . Implantable Lead Implant* 02/12/2023 [id number]   . Implantable Lead Locatio* 02/12/2023 UNKNOWN   . Implantable Lead Location 02/12/2023 660630   . Implantable Lead Connect* 02/12/2023 160109   . Lead Channel Setting Sen* 02/12/2023 0.5   . Lead Channel Setting Sen* 02/12/2023 Adaptive Sensing   . Lead Channel Setting Pac* 02/12/2023 3.5   . Lead Channel Setting Pac* 02/12/2023 0.5   . Lead Channel Setting Pac* 02/12/2023 2.5   . Zone Setting Status 02/12/2023 Active   . Zone Setting Status 02/12/2023 Inactive   . Zone Setting Status 02/12/2023 755011   . Lead Channel Status 02/12/2023 NULL   . Lead Channel  Impedance V* 02/12/2023 490   . Lead Channel Sensing Int* 02/12/2023 3.9   . Lead Channel Pacing Thre* 02/12/2023 0.75   . Lead Channel Pacing Thre* 02/12/2023 0.5   . Lead Channel Status 02/12/2023 NULL   . Lead Channel Impedance V* 02/12/2023 440   . Lead Channel Sensing Int* 02/12/2023 12.0   . Lead Channel Pacing Thre* 02/12/2023 0.75   . Lead Channel Pacing Thre* 02/12/2023 0.5   . HighPow Impedance 02/12/2023 86   . HighPow Imped Status 02/12/2023 NULL   . Battery Status 02/12/2023 MOS   . Battery Remaining Longev* 02/12/2023 73   . Battery Remaining Percen* 02/12/2023 93.0   . Battery Voltage 02/12/2023 3.02   . Huston Foley Statistic RA Perce* 02/12/2023 87.0   . Brady Statistic RV Perce* 02/12/2023 4.5   . Brady Statistic AP VP Pe* 02/12/2023 4.5   . Brady Statistic AS VP Pe* 02/12/2023 1.0   . Brady Statistic AP VS Pe* 02/12/2023 84.0   . Brady Statistic AS VS Pe* 02/12/2023 10.0   Clinical Support on 11/21/2022  Component  Date Value  . Date Time Interrogation * 11/21/2022 [id number]   . Pulse Generator Manufact* 11/21/2022 SJCR   . Pulse Gen Model 11/21/2022 [id number] Gallant DR   . Pulse Gen Serial Number 11/21/2022 [id number]   . Clinic Name 11/21/2022 [facility]   . Implantable Pulse Genera* 11/21/2022 Implantable Cardiac Defibulator   . Implantable Pulse Genera* 11/21/2022 [id number]   . Implantable Lead Manufac* 11/21/2022 OTHER   . Implantable Lead Model 11/21/2022 UUV2536/64 ULTIPACE   . Implantable Lead Serial * 11/21/2022 [id number]   . Implantable Lead Implant* 11/21/2022 [id number]   . Implantable Lead Locatio* 11/21/2022 UNKNOWN   . Implantable Lead Location 11/21/2022 [id number]   . Implantable Lead Connect* 11/21/2022 [id number]   . Implantable Lead Manufac* 11/21/2022 SJCR   . Implantable Lead Model 11/21/2022 7122 Durata   . Implantable Lead Serial * 11/21/2022 7122Q/65   . Implantable Lead Implant* 11/21/2022 [id number]   . Implantable Lead Locatio* 11/21/2022  UNKNOWN   . Implantable Lead Location 11/21/2022 093235   . Implantable Lead Connect* 11/21/2022 [id number]   . Lead Channel Setting Sen* 11/21/2022 0.5   . Lead Channel Setting Pac* 11/21/2022 3.5   . Lead Channel Setting Pac* 11/21/2022 0.5   . Lead Channel Setting Pac* 11/21/2022 2.5   . Zone Setting Status 11/21/2022 Active   . Zone Setting Status 11/21/2022 Inactive   . Zone Setting Status 11/21/2022 755011   . Lead Channel Impedance V* 11/21/2022 475.0   . Lead Channel Sensing Int* 11/21/2022 5.0   . Lead Channel Pacing Thre* 11/21/2022 0.75   . Lead Channel Pacing Thre* 11/21/2022 0.5   . Lead Channel Pacing Thre* 11/21/2022 0.75   . Lead Channel Pacing Thre* 11/21/2022 0.5   . Lead Channel Impedance V* 11/21/2022 500.0   . Lead Channel Sensing Int* 11/21/2022 12.0   . Lead Channel Pacing Thre* 11/21/2022 0.75   . Lead Channel Pacing Thre* 11/21/2022 0.5   . Lead Channel Pacing Thre* 11/21/2022 0.75   . Lead Channel Pacing Thre* 11/21/2022 0.5   . HighPow Impedance 11/21/2022 67.5   . Battery Remaining Longev* 11/21/2022 76   . Huston Foley Statistic RA Perce* 11/21/2022 86.0   . Brady Statistic RV Perce* 11/21/2022 4.9   . Eval Rhythm 11/21/2022 AS/VS 72   Admission on 11/11/2022, Discharged on 11/11/2022  Component Date Value  . WBC 11/11/2022 9.5   . RBC 11/11/2022 3.90 (L)   . Hemoglobin 11/11/2022 13.0   . HCT 11/11/2022 39.4   . MCV 11/11/2022 101.0 (H)   . [facility] 11/11/2022 33.3   . MCHC 11/11/2022 33.0   . RDW 11/11/2022 12.4   . Platelets 11/11/2022 252   . nRBC 11/11/2022 0.0   Clinical Support on 10/29/2022  Component Date Value  . Date Time Interrogation * 10/29/2022 [id number]   . Pulse Generator Manufact* 10/29/2022 SJCR   . Pulse Gen Model 10/29/2022 1411-36Q [employer]   . Pulse Gen Serial Number 10/29/2022 [id number]   . Clinic Name 10/29/2022 [facility]   . Implantable Pulse Genera* 10/29/2022 Implantable Cardiac Defibulator   . Lead Channel Setting Sen*  10/29/2022 0.5   . Lead Channel Setting Sen* 10/29/2022 Adaptive Sensing   . Lead Channel Setting Pac* 10/29/2022 0.5   . Lead Channel Setting Pac* 10/29/2022 2.5   . Zone Setting Status 10/29/2022 Active   . Zone Setting Status 10/29/2022 Inactive   . Zone Setting Status 10/29/2022 755011   . Lead Channel Status 10/29/2022 NULL   . Lead Channel  Impedance V* 10/29/2022 530   . Lead Channel Sensing Int* 10/29/2022 11.8   . Lead Channel Pacing Thre* 10/29/2022 0.75   . Lead Channel Pacing Thre* 10/29/2022 0.5   . HighPow Impedance 10/29/2022 95   . HighPow Impedance 10/29/2022 95   . HighPow Imped Status 10/29/2022 NULL   . HighPow Imped Status 10/29/2022 NULL   . Battery Status 10/29/2022 MOS   . Battery Remaining Longev* 10/29/2022 64   . Battery Remaining Percen* 10/29/2022 62.0   . Battery Voltage 10/29/2022 2.98   . Huston Foley Statistic RV Perce* 10/29/2022 1.0   There may be more visits with results that are not included.   No image results found.   CUP PACEART REMOTE DEVICE CHECK  Result Date: 08/13/2023 Scheduled remote reviewed. Normal device function.  1 AMS, 8 seconds in duration. Next remote 91 days. [place], CVRS  DG Foot Complete Right  Result Date: 08/06/2023 Please see detailed radiograph report in office note.   CT Knee Right Wo Contrast  Result Date: 05/16/2023 CLINICAL DATA:  Knee pain, stress fracture suspected. Patient fell yesterday. Unable to bear weight. EXAM: CT OF THE RIGHT KNEE WITHOUT CONTRAST TECHNIQUE: Multidetector CT imaging of the right knee was performed according to the standard protocol. Multiplanar CT image reconstructions were also generated. RADIATION DOSE REDUCTION: This exam was performed according to the departmental dose-optimization program which includes automated exposure control, adjustment of the mA and/or kV according to patient size and/or use of iterative reconstruction technique. COMPARISON:  Radiographs same date. FINDINGS:  Bones/Joint/Cartilage No evidence of acute fracture or dislocation. The bones are demineralized. There are tricompartmental degenerative changes which are most advanced in the patellofemoral compartment. There is prominent subchondral cyst formation in the femoral trochlea. Medial compartment subchondral sclerosis is likely reactive. No evidence of lipohemarthrosis or significant knee joint effusion. Ligaments Suboptimally assessed by CT. The cruciate ligaments are grossly intact. Muscles and Tendons The quadriceps and patellar tendons are intact. The muscles about the knee appear unremarkable, without atrophy or hematoma. Soft tissues No evidence of periarticular hematoma, foreign body or soft tissue emphysema. Prominent popliteal and runoff vessel atherosclerosis. IMPRESSION: 1. No evidence of acute fracture or dislocation. 2. Tricompartmental degenerative changes, most advanced in the patellofemoral compartment. 3. No evidence of significant knee joint effusion or periarticular hematoma. 4. Prominent popliteal and runoff vessel atherosclerosis. Electronically Signed   By: Carey Bullocks M.D.   On: 05/16/2023 16:58   DG Foot Complete Right  Result Date: 05/16/2023 CLINICAL DATA:  Swelling injury. EXAM: RIGHT FOOT COMPLETE - 3 VIEW COMPARISON:  None Available. FINDINGS: Soft swelling is present over the dorsum of the foot. No acute osseous abnormality is present. Degenerative changes are present in the midfoot. Mild osteopenia is present. IMPRESSION: Soft tissue swelling over the dorsum of the foot without underlying fracture. Electronically Signed   By: Marin Roberts M.D.   On: 05/16/2023 15:35   DG Ankle Complete Right  Result Date: 05/16/2023 CLINICAL DATA:  Fall.  Right ankle pain/swelling. EXAM: RIGHT ANKLE - COMPLETE 3+ VIEW COMPARISON:  None Available. FINDINGS: No acute fracture or dislocation. No aggressive osseous lesion. Ankle mortise appears intact. Calcaneal spur noted along the  Achilles tendon and Plantar aponeurosis attachment sites. There is mild asymmetric soft tissue swelling overlying the lateral malleolus without discrete underlying fracture. Findings are concerning for soft tissue/ligamentous injury. No radiopaque foreign bodies. IMPRESSION: 1. Lateral soft tissue swelling, concerning for underlying soft tissue/ligamentous injury. 2. No acute fracture or dislocation. Electronically Signed  By: Jules Schick M.D.   On: 05/16/2023 15:20   DG Knee Complete 4 Views Right  Result Date: 05/16/2023 CLINICAL DATA:  Fall.  Right knee pain/swelling. EXAM: RIGHT KNEE - COMPLETE 4+ VIEW COMPARISON:  None Available. FINDINGS: There is subtle breach in the articular surface of the lateral tibial condyle, marked with electronic arrow sign on image number 3. It is unclear whether this represents a true undisplaced fracture versus artifact. Correlate clinically to determine the need for additional imaging with CT scan. No other acute fracture or dislocation. No aggressive osseous lesion. Mild degenerative changes of the knee joint noted. No knee effusion or focal soft tissue swelling. No radiopaque foreign bodies. IMPRESSION: 1. Subtle breach in the articular surface of the lateral tibial condyle/tibial plateau. It is unclear whether this represents a true undisplaced fracture versus artifact. Correlate clinically to determine the need for additional imaging with CT scan. Electronically Signed   By: Jules Schick M.D.   On: 05/16/2023 15:19         Additional Info: This encounter employed real-time, collaborative documentation. The patient actively reviewed and updated their medical record on a shared screen, ensuring transparency and facilitating joint problem-solving for the problem list, overview, and plan. This approach promotes accurate, informed care. The treatment plan was discussed and reviewed in detail, including medication safety, potential side effects, and all patient  questions. We confirmed understanding and comfort with the plan. Follow-up instructions were established, including contacting the office for any concerns, returning if symptoms worsen, persist, or new symptoms develop, and precautions for potential emergency department visits.

## 2023-08-23 LAB — TROPONIN I: Troponin I: 14 ng/L (ref <=47)

## 2023-08-23 LAB — TSH RFX ON ABNORMAL TO FREE T4: TSH: 1.03 u[IU]/mL (ref 0.450–4.500)

## 2023-08-24 NOTE — Assessment & Plan Note (Signed)
Heart Failure   He has heart failure with a defibrillator and pacemaker, with no new leg swelling and a recent cardiology follow-up noted. We advised against alcohol consumption due to heart failure and emphasized the importance of avoiding alcohol to prevent exacerbation. We will continue current heart failure medications, monitor for signs of fluid overload, and advise to avoid alcohol.

## 2023-08-24 NOTE — Patient Instructions (Signed)
It was a pleasure seeing you today! Your health and satisfaction are our top priorities.  [name], MD  Your Providers PCP: Shelva Majestic, MD,  [phone]) Referring Provider: Shelva Majestic, MD,  [phone]) Care Team Provider: Thurmon Fair, MD,  [phone])   VISIT SUMMARY:  Today, we discussed your recent difficulty breathing, which has been ongoing for the past few days and may be related to a recent cold and marijuana use. We also reviewed your current medications, including semaglutide for weight loss, and addressed your financial concerns regarding treatment options. Additionally, we talked about your heart failure management and the importance of avoiding alcohol. You mentioned a recent cardiology follow-up and an upcoming echocardiogram. We also discussed general health maintenance and the benefits of your weight loss plan.  YOUR PLAN:  -ASTHMATIC BRONCHITIS: Asthmatic bronchitis is a condition where the airways in your lungs become inflamed and produce mucus, leading to difficulty breathing. We will give you an 80 mg Solu-Medrol shot today and start you on prednisone tomorrow. We will also order a chest x-ray and perform a COVID-19 test. If the chest x-ray shows pneumonia, we will prescribe antibiotics. We recommend using nebulizers for medication delivery, but inhalers are an alternative if cost is a concern. Please visit the ER if your symptoms worsen or do not improve by tonight.  -HEART FAILURE: Heart failure is a condition where the heart does not pump blood as well as it should. You have a defibrillator and pacemaker, and there is no new leg swelling. We advise you to avoid alcohol to prevent worsening your condition. Continue taking your current heart failure medications and monitor for signs of fluid overload.  -MEDICATION MANAGEMENT: We reviewed your medications, including Eliquis, sertraline, trazodone, and semaglutide. There is a potential interaction  between prednisone and Eliquis, which can increase the risk of gastrointestinal bleeding. We recommend managing heartburn aggressively and will prescribe Pepcid Complete for prevention. Stay hydrated but watch for signs of fluid overload due to heart failure. Continue taking semaglutide for weight loss.  -GENERAL HEALTH MAINTENANCE: You are on a weight loss plan with semaglutide, which helps prevent heart attacks, strokes, fatty liver, and diabetes. It is important to consume high-protein foods to maintain muscle mass and avoid sugary drinks. This will support your long-term health.  INSTRUCTIONS:  We will order a comprehensive blood panel for Monday. Please visit the ER if your symptoms worsen or do not improve by tonight.  NEXT STEPS: [x]  Early Intervention: Schedule sooner appointment, call our on-call services, or go to emergency room if there is any significant Increase in pain or discomfort New or worsening symptoms Sudden or severe changes in your health [x]  Flexible Follow-Up: We recommend a No follow-ups on file. for optimal routine care. This allows for progress monitoring and treatment adjustments. [x]  Preventive Care: Schedule your annual preventive care visit! It's typically covered by insurance and helps identify potential health issues early. [x]  Lab & X-ray Appointments: Incomplete tests scheduled today, or call to schedule. X-rays: [facility] (M-F, 8:30am-noon or 1pm-5pm). [x]  Medical Information Release: Sign a release form at front desk to obtain relevant medical information we don't have.  MAKING THE MOST OF OUR FOCUSED 20 MINUTE APPOINTMENTS: [x]   Clearly state your top concerns at the beginning of the visit to focus our discussion [x]   If you anticipate you will need more time, please inform the front desk during scheduling - we can book multiple appointments in the same week. [x]   If  you have transportation problems- use our convenient video appointments  or ask about transportation support. [x]   We can get down to business faster if you use MyChart to update information before the visit and submit non-urgent questions before your visit. Thank you for taking the time to provide details through MyChart.  Let our nurse know and she can import this information into your encounter documents.  Arrival and Wait Times: [x]   Arriving on time ensures that everyone receives prompt attention. [x]   Early morning (8a) and afternoon (1p) appointments tend to have shortest wait times. [x]   Unfortunately, we cannot delay appointments for late arrivals or hold slots during phone calls.  Getting Answers and Following Up [x]   Simple Questions & Concerns: For quick questions or basic follow-up after your visit, reach [place] at [phone] or MyChart messaging. [x]   Complex Concerns: If your concern is more complex, scheduling an appointment might be best. Discuss this with the staff to find the most suitable option. [x]   Lab & Imaging Results: We'll contact you directly if results are abnormal or you don't use MyChart. Most normal results will be on MyChart within 2-3 business days, with a review message from Dr. Jon Billings. Haven't heard back in 2 weeks? Need results sooner? Contact us at [phone]. [x]   Referrals: Our referral coordinator will manage specialist referrals. The specialist's office should contact you within 2 weeks to schedule an appointment. Call us if you haven't heard from them after 2 weeks.  Staying Connected [x]   MyChart: Activate your MyChart for the fastest way to access results and message [place]. See the last page of this paperwork for instructions on how to activate.  Bring to Your Next Appointment [x]   Medications: Please bring all your medication bottles to your next appointment to ensure we have an accurate record of your prescriptions. [x]   Health Diaries: If you're monitoring any health conditions at home, keeping a diary of your readings  can be very helpful for discussions at your next appointment.  Billing [x]   X-ray & Lab Orders: These are billed by separate companies. Contact the invoicing company directly for questions or concerns. [x]   Visit Charges: Discuss any billing inquiries with our administrative services team.  Your Satisfaction Matters [x]   Share Your Experience: We strive for your satisfaction! If you have any complaints, or preferably compliments, please let Dr. Jon Billings know directly or contact our Practice Administrators, Edwena Felty or [employer], by asking at the front desk.   Reviewing Your Records [x]   Review this early draft of your clinical encounter notes below and the final encounter summary tomorrow on MyChart after its been completed.  All orders placed so far are visible here: Severe persistent asthmatic bronchitis without complication -     DG Chest 2 View; Future -     Azithromycin; Take 2 tablets on day 1, then 1 tablet daily on days 2 through 5  Dispense: 6 tablet; Refill: 0 -     predniSONE; Take 2 pills for 3 days, 1 pill for 4 days  Dispense: 10 tablet; Refill: 0 -     Ipratropium-Albuterol; Take 3 mLs by nebulization every 4 (four) hours as needed.  Dispense: 30 mL; Refill: 1 -     methylPREDNISolone Acetate -     For home use only DME Nebulizer machine -     Albuterol Sulfate HFA; Inhale 2 puffs into the lungs every 6 (six) hours as needed for wheezing or shortness of breath.  Dispense: 8  g; Refill: 2 -     Lipid panel -     Comprehensive metabolic panel -     CBC with Differential/Platelet -     TSH Rfx on Abnormal to Free T4 -     Brain natriuretic peptide -     Troponin I -  History of non-ST elevation myocardial infarction (NSTEMI)  Non-ischemic cardiomyopathy (HCC)  High risk medication use

## 2023-08-25 ENCOUNTER — Telehealth: Payer: Self-pay | Admitting: Licensed Clinical Social Worker

## 2023-08-25 NOTE — Telephone Encounter (Signed)
H&V Care Navigation CSW Progress Note  Clinical Social Worker contacted caregiver by phone (pt wife John [name] at [phone]- verbal permission from pt) to f/u on referral to [facility] team at [employer] for assistance with Medicare enrollment. Pt wife confirms they were successful in reaching [facility] team, found them helpful and were able to enroll pt in [facility] Advantage plan. Pt wife appreciative of information, encouraged her to call me or [facility] if any additional issues/questions.  Patient is participating in a Managed Medicaid Plan:  No, Medicare Advantage only  SDOH Screenings   Depression (PHQ2-9): Low Risk  (05/09/2023)  Social Connections: Unknown (01/27/2022)   Received from [facility], [facility]  Tobacco Use: Low Risk  (08/22/2023)  Health Literacy: Adequate Health Literacy (08/18/2023)   John [name], MSW, LCSW Clinical Social Worker II [facility], [facility] Heart/Vascular Care Navigation  [phone]- work cell phone (preferred) [phone]- desk phone

## 2023-08-25 NOTE — Telephone Encounter (Signed)
Error

## 2023-08-28 ENCOUNTER — Encounter: Payer: Commercial Managed Care - HMO | Admitting: Cardiovascular Disease

## 2023-08-28 ENCOUNTER — Telehealth: Payer: Self-pay | Admitting: Cardiovascular Disease

## 2023-08-28 ENCOUNTER — Telehealth: Payer: Self-pay | Admitting: Family Medicine

## 2023-08-28 NOTE — Telephone Encounter (Signed)
Pt saw Dr Jon Billings on 12/6 and is not feeling better.He wanted to know if we can send him some stronger antibiotic. Please advise.

## 2023-08-28 NOTE — Telephone Encounter (Signed)
Patient calling the office for samples of medication:   1.  What medication and dosage are you requesting samples for?  dapagliflozin propanediol (FARXIGA) 10 MG TABS tablet  ELIQUIS 5 MG TABS tablet   2.  Are you currently out of this medication?    Wife Lynden Ang) stated patient's insurance wont' start again until January 1st and he will need samples of these medication until his insurance starts.

## 2023-08-29 MED ORDER — APIXABAN 5 MG PO TABS: 5.0000 mg | ORAL_TABLET | Freq: Two times a day (BID) | ORAL | 0 refills | Status: DC

## 2023-08-29 MED ORDER — DAPAGLIFLOZIN PROPANEDIOL 10 MG PO TABS: 10.0000 mg | ORAL_TABLET | Freq: Every day | ORAL | 0 refills | Status: DC

## 2023-08-29 NOTE — Telephone Encounter (Signed)
Ok to give enough until Jan 1

## 2023-08-29 NOTE — Telephone Encounter (Signed)
Called and spoke to patient's to let her know samples for Eliquis 5 mg and Farxiga 10 mg are up front registration desk ready for pick up. She will come by to pickup.

## 2023-08-29 NOTE — Telephone Encounter (Signed)
Pt made aware of Dr Kandra Nicolas recommendations, pt stated that he will get the XR done today

## 2023-09-02 ENCOUNTER — Other Ambulatory Visit: Payer: Self-pay | Admitting: Podiatry

## 2023-09-04 ENCOUNTER — Other Ambulatory Visit (HOSPITAL_COMMUNITY): Payer: Self-pay | Admitting: Cardiology

## 2023-09-05 ENCOUNTER — Emergency Department (HOSPITAL_BASED_OUTPATIENT_CLINIC_OR_DEPARTMENT_OTHER): Payer: Medicare Other

## 2023-09-05 ENCOUNTER — Encounter (HOSPITAL_BASED_OUTPATIENT_CLINIC_OR_DEPARTMENT_OTHER): Payer: Self-pay | Admitting: Emergency Medicine

## 2023-09-05 ENCOUNTER — Emergency Department (HOSPITAL_BASED_OUTPATIENT_CLINIC_OR_DEPARTMENT_OTHER): Payer: Medicare Other | Admitting: Radiology

## 2023-09-05 ENCOUNTER — Other Ambulatory Visit: Payer: Self-pay

## 2023-09-05 ENCOUNTER — Inpatient Hospital Stay (HOSPITAL_BASED_OUTPATIENT_CLINIC_OR_DEPARTMENT_OTHER)
Admission: EM | Admit: 2023-09-05 | Discharge: 2023-09-09 | DRG: 398 | Disposition: A | Discharge status: Home / Self Care | Payer: Medicare Other | Attending: Internal Medicine | Admitting: Internal Medicine

## 2023-09-05 DIAGNOSIS — E7800 Pure hypercholesterolemia, unspecified: Secondary | ICD-10-CM | POA: Diagnosis not present

## 2023-09-05 DIAGNOSIS — Z7901 Long term (current) use of anticoagulants: Secondary | ICD-10-CM

## 2023-09-05 DIAGNOSIS — Z96643 Presence of artificial hip joint, bilateral: Secondary | ICD-10-CM | POA: Diagnosis present

## 2023-09-05 DIAGNOSIS — K353 Acute appendicitis with localized peritonitis, without perforation or gangrene: Principal | ICD-10-CM

## 2023-09-05 DIAGNOSIS — Z79899 Other long term (current) drug therapy: Secondary | ICD-10-CM

## 2023-09-05 DIAGNOSIS — Z6837 Body mass index (BMI) 37.0-37.9, adult: Secondary | ICD-10-CM

## 2023-09-05 DIAGNOSIS — I428 Other cardiomyopathies: Secondary | ICD-10-CM | POA: Diagnosis present

## 2023-09-05 DIAGNOSIS — E871 Hypo-osmolality and hyponatremia: Secondary | ICD-10-CM | POA: Diagnosis present

## 2023-09-05 DIAGNOSIS — K381 Appendicular concretions: Secondary | ICD-10-CM | POA: Diagnosis present

## 2023-09-05 DIAGNOSIS — Z888 Allergy status to other drugs, medicaments and biological substances status: Secondary | ICD-10-CM | POA: Diagnosis not present

## 2023-09-05 DIAGNOSIS — Z8249 Family history of ischemic heart disease and other diseases of the circulatory system: Secondary | ICD-10-CM

## 2023-09-05 DIAGNOSIS — I10 Essential (primary) hypertension: Secondary | ICD-10-CM | POA: Diagnosis not present

## 2023-09-05 DIAGNOSIS — Z820 Family history of epilepsy and other diseases of the nervous system: Secondary | ICD-10-CM

## 2023-09-05 DIAGNOSIS — I5042 Chronic combined systolic (congestive) and diastolic (congestive) heart failure: Secondary | ICD-10-CM | POA: Diagnosis present

## 2023-09-05 DIAGNOSIS — E785 Hyperlipidemia, unspecified: Secondary | ICD-10-CM | POA: Diagnosis present

## 2023-09-05 DIAGNOSIS — N179 Acute kidney failure, unspecified: Secondary | ICD-10-CM | POA: Diagnosis present

## 2023-09-05 DIAGNOSIS — I509 Heart failure, unspecified: Secondary | ICD-10-CM | POA: Diagnosis not present

## 2023-09-05 DIAGNOSIS — K3532 Acute appendicitis with perforation and localized peritonitis, without abscess: Secondary | ICD-10-CM | POA: Diagnosis present

## 2023-09-05 DIAGNOSIS — E669 Obesity, unspecified: Secondary | ICD-10-CM | POA: Diagnosis present

## 2023-09-05 DIAGNOSIS — K3580 Unspecified acute appendicitis: Secondary | ICD-10-CM | POA: Diagnosis present

## 2023-09-05 DIAGNOSIS — F411 Generalized anxiety disorder: Secondary | ICD-10-CM | POA: Diagnosis present

## 2023-09-05 DIAGNOSIS — G4733 Obstructive sleep apnea (adult) (pediatric): Secondary | ICD-10-CM | POA: Diagnosis present

## 2023-09-05 DIAGNOSIS — Z885 Allergy status to narcotic agent status: Secondary | ICD-10-CM | POA: Diagnosis not present

## 2023-09-05 DIAGNOSIS — K37 Unspecified appendicitis: Secondary | ICD-10-CM | POA: Diagnosis not present

## 2023-09-05 DIAGNOSIS — J45909 Unspecified asthma, uncomplicated: Secondary | ICD-10-CM | POA: Diagnosis present

## 2023-09-05 DIAGNOSIS — Z883 Allergy status to other anti-infective agents status: Secondary | ICD-10-CM

## 2023-09-05 DIAGNOSIS — Z7985 Long-term (current) use of injectable non-insulin antidiabetic drugs: Secondary | ICD-10-CM

## 2023-09-05 DIAGNOSIS — I110 Hypertensive heart disease with heart failure: Secondary | ICD-10-CM | POA: Diagnosis present

## 2023-09-05 DIAGNOSIS — F32A Depression, unspecified: Secondary | ICD-10-CM | POA: Diagnosis present

## 2023-09-05 DIAGNOSIS — Z95810 Presence of automatic (implantable) cardiac defibrillator: Secondary | ICD-10-CM

## 2023-09-05 DIAGNOSIS — Z7984 Long term (current) use of oral hypoglycemic drugs: Secondary | ICD-10-CM

## 2023-09-05 DIAGNOSIS — Z96612 Presence of left artificial shoulder joint: Secondary | ICD-10-CM | POA: Diagnosis present

## 2023-09-05 DIAGNOSIS — Z801 Family history of malignant neoplasm of trachea, bronchus and lung: Secondary | ICD-10-CM

## 2023-09-05 DIAGNOSIS — D62 Acute posthemorrhagic anemia: Secondary | ICD-10-CM | POA: Diagnosis not present

## 2023-09-05 DIAGNOSIS — I480 Paroxysmal atrial fibrillation: Secondary | ICD-10-CM | POA: Diagnosis present

## 2023-09-05 DIAGNOSIS — R0902 Hypoxemia: Secondary | ICD-10-CM | POA: Diagnosis not present

## 2023-09-05 DIAGNOSIS — Z791 Long term (current) use of non-steroidal anti-inflammatories (NSAID): Secondary | ICD-10-CM

## 2023-09-05 DIAGNOSIS — I2510 Atherosclerotic heart disease of native coronary artery without angina pectoris: Secondary | ICD-10-CM | POA: Diagnosis present

## 2023-09-05 DIAGNOSIS — Z8042 Family history of malignant neoplasm of prostate: Secondary | ICD-10-CM

## 2023-09-05 DIAGNOSIS — K769 Liver disease, unspecified: Secondary | ICD-10-CM | POA: Diagnosis present

## 2023-09-05 DIAGNOSIS — F418 Other specified anxiety disorders: Secondary | ICD-10-CM | POA: Diagnosis not present

## 2023-09-05 DIAGNOSIS — Z96611 Presence of right artificial shoulder joint: Secondary | ICD-10-CM | POA: Diagnosis present

## 2023-09-05 LAB — COMPREHENSIVE METABOLIC PANEL
ALT: 23 U/L (ref 0–44)
AST: 15 U/L (ref 15–41)
Albumin: 4.2 g/dL (ref 3.5–5.0)
Alkaline Phosphatase: 52 U/L (ref 38–126)
Anion gap: 11 (ref 5–15)
BUN: 18 mg/dL (ref 8–23)
CO2: 30 mmol/L (ref 22–32)
Calcium: 9.9 mg/dL (ref 8.9–10.3)
Chloride: 93 mmol/L — ABNORMAL LOW (ref 98–111)
Creatinine, Ser: 1.35 mg/dL — ABNORMAL HIGH (ref 0.61–1.24)
GFR, Estimated: 59 mL/min — ABNORMAL LOW (ref >60)
Glucose, Bld: 130 mg/dL — ABNORMAL HIGH (ref 70–99)
Potassium: 3.9 mmol/L (ref 3.5–5.1)
Sodium: 134 mmol/L — ABNORMAL LOW (ref 135–145)
Total Bilirubin: 1.1 mg/dL (ref <1.2)
Total Protein: 7 g/dL (ref 6.5–8.1)

## 2023-09-05 LAB — CBC
HCT: 39.8 % (ref 39.0–52.0)
Hemoglobin: 13.2 g/dL (ref 13.0–17.0)
MCH: 32.6 pg (ref 26.0–34.0)
MCHC: 33.2 g/dL (ref 30.0–36.0)
MCV: 98.3 fL (ref 80.0–100.0)
Platelets: 243 10*3/uL (ref 150–400)
RBC: 4.05 MIL/uL — ABNORMAL LOW (ref 4.22–5.81)
RDW: 13.2 % (ref 11.5–15.5)
WBC: 26.3 10*3/uL — ABNORMAL HIGH (ref 4.0–10.5)
nRBC: 0 % (ref 0.0–0.2)

## 2023-09-05 LAB — URINALYSIS, ROUTINE W REFLEX MICROSCOPIC
Bacteria, UA: NONE SEEN
Bilirubin Urine: NEGATIVE
Glucose, UA: 250 mg/dL — AB
Hgb urine dipstick: NEGATIVE
Ketones, ur: NEGATIVE mg/dL
Leukocytes,Ua: NEGATIVE
Nitrite: NEGATIVE
Protein, ur: 30 mg/dL — AB
Specific Gravity, Urine: >1.046 — ABNORMAL HIGH (ref 1.005–1.030)
pH: 6 (ref 5.0–8.0)

## 2023-09-05 LAB — TROPONIN I (HIGH SENSITIVITY)
Troponin I (High Sensitivity): 20 ng/L — ABNORMAL HIGH (ref <18)
Troponin I (High Sensitivity): 19 ng/L — ABNORMAL HIGH (ref <18)

## 2023-09-05 LAB — LACTIC ACID, PLASMA: Lactic Acid, Venous: 1.7 mmol/L (ref 0.5–1.9)

## 2023-09-05 LAB — LIPASE, BLOOD: Lipase: 14 U/L (ref 11–51)

## 2023-09-05 MED ORDER — SACUBITRIL-VALSARTAN 97-103 MG PO TABS
1.0000 | ORAL_TABLET | Freq: Two times a day (BID) | ORAL | Status: DC
  Filled 2023-09-05: qty 1

## 2023-09-05 MED ORDER — PIPERACILLIN-TAZOBACTAM 3.375 G IVPB
3.3750 g | Freq: Three times a day (TID) | INTRAVENOUS | Status: DC
  Administered 2023-09-05 – 2023-09-09 (×11): 3.375 g via INTRAVENOUS
  Filled 2023-09-05 (×11): qty 50

## 2023-09-05 MED ORDER — HYDROMORPHONE HCL 1 MG/ML IJ SOLN
1.0000 mg | INTRAMUSCULAR | Status: DC | PRN
  Administered 2023-09-06 – 2023-09-08 (×11): 1 mg via INTRAVENOUS
  Filled 2023-09-05 (×12): qty 1

## 2023-09-05 MED ORDER — SODIUM CHLORIDE 0.9 % IV BOLUS
1000.0000 mL | Freq: Once | INTRAVENOUS | Status: AC
  Administered 2023-09-05: 1000 mL via INTRAVENOUS

## 2023-09-05 MED ORDER — ACETAMINOPHEN 325 MG PO TABS
650.0000 mg | ORAL_TABLET | Freq: Four times a day (QID) | ORAL | Status: DC | PRN
  Administered 2023-09-07: 650 mg via ORAL
  Filled 2023-09-05: qty 2

## 2023-09-05 MED ORDER — ALBUTEROL SULFATE (2.5 MG/3ML) 0.083% IN NEBU: 2.5000 mg | INHALATION_SOLUTION | Freq: Four times a day (QID) | RESPIRATORY_TRACT | Status: DC | PRN

## 2023-09-05 MED ORDER — ROSUVASTATIN CALCIUM 20 MG PO TABS
20.0000 mg | ORAL_TABLET | Freq: Every day | ORAL | Status: DC
  Administered 2023-09-06 – 2023-09-09 (×4): 20 mg via ORAL
  Filled 2023-09-05 (×4): qty 1

## 2023-09-05 MED ORDER — SODIUM CHLORIDE 0.9% FLUSH
3.0000 mL | Freq: Two times a day (BID) | INTRAVENOUS | Status: DC
  Administered 2023-09-05 – 2023-09-09 (×8): 3 mL via INTRAVENOUS

## 2023-09-05 MED ORDER — IOHEXOL 300 MG/ML  SOLN
100.0000 mL | Freq: Once | INTRAMUSCULAR | Status: AC | PRN
  Administered 2023-09-05: 100 mL via INTRAVENOUS

## 2023-09-05 MED ORDER — CARVEDILOL 25 MG PO TABS
25.0000 mg | ORAL_TABLET | Freq: Every evening | ORAL | Status: DC
Start: 2023-09-06 — End: 2023-09-07
  Administered 2023-09-06: 25 mg via ORAL
  Filled 2023-09-05: qty 1

## 2023-09-05 MED ORDER — SERTRALINE HCL 100 MG PO TABS
100.0000 mg | ORAL_TABLET | Freq: Every day | ORAL | Status: DC
  Administered 2023-09-06 – 2023-09-09 (×4): 100 mg via ORAL
  Filled 2023-09-05 (×4): qty 1

## 2023-09-05 MED ORDER — TRAZODONE HCL 50 MG PO TABS: 50.0000 mg | ORAL_TABLET | Freq: Every day | ORAL | Status: DC

## 2023-09-05 MED ORDER — HYDROMORPHONE HCL 1 MG/ML IJ SOLN
0.5000 mg | Freq: Once | INTRAMUSCULAR | Status: AC
  Administered 2023-09-05: 0.5 mg via INTRAVENOUS
  Filled 2023-09-05: qty 1

## 2023-09-05 MED ORDER — TORSEMIDE 20 MG PO TABS
60.0000 mg | ORAL_TABLET | Freq: Every day | ORAL | Status: DC
  Filled 2023-09-05: qty 3

## 2023-09-05 MED ORDER — ACETAMINOPHEN 650 MG RE SUPP: 650.0000 mg | Freq: Four times a day (QID) | RECTAL | Status: DC | PRN

## 2023-09-05 MED ORDER — ONDANSETRON HCL 4 MG/2ML IJ SOLN
4.0000 mg | Freq: Once | INTRAMUSCULAR | Status: AC
  Administered 2023-09-05: 4 mg via INTRAVENOUS

## 2023-09-05 MED ORDER — CARVEDILOL 12.5 MG PO TABS
12.5000 mg | ORAL_TABLET | Freq: Every day | ORAL | Status: DC
Start: 2023-09-06 — End: 2023-09-07
  Administered 2023-09-06: 12.5 mg via ORAL
  Filled 2023-09-05 (×2): qty 1

## 2023-09-05 MED ORDER — HYDROMORPHONE HCL 1 MG/ML IJ SOLN
0.5000 mg | Freq: Once | INTRAMUSCULAR | Status: AC
Start: 2023-09-05 — End: 2023-09-05
  Administered 2023-09-05: 0.5 mg via INTRAVENOUS
  Filled 2023-09-05: qty 1

## 2023-09-05 MED ORDER — PIPERACILLIN-TAZOBACTAM 3.375 G IVPB
3.3750 g | Freq: Once | INTRAVENOUS | Status: AC
  Administered 2023-09-05: 3.375 g via INTRAVENOUS
  Filled 2023-09-05: qty 50

## 2023-09-05 MED ORDER — ONDANSETRON HCL 4 MG/2ML IJ SOLN: 4.0000 mg | Freq: Four times a day (QID) | INTRAMUSCULAR | Status: DC | PRN

## 2023-09-05 MED ORDER — HYDROMORPHONE HCL 1 MG/ML IJ SOLN
0.5000 mg | INTRAMUSCULAR | Status: DC | PRN
  Administered 2023-09-05: 0.5 mg via INTRAVENOUS
  Filled 2023-09-05: qty 0.5

## 2023-09-05 NOTE — ED Provider Notes (Signed)
[place] EMERGENCY DEPARTMENT AT [facility] ([facility]) Provider Note   CSN: [id number] Arrival date & time: 09/05/23  1344     History  Chief Complaint  Patient presents with   Abdominal Pain    John [name] is a 62 y.o. male.  Patient with history of severe nonischemic cardiomyopathy (with left ventricular ejection fraction as low as 15-20%, most recently estimated to be 30-35% by an echo 01/22/2022), pacemaker/ICD upgraded to a dual-chamber device in February 2024, paroxysmal atrial fibrillation on anticoagulation with Eliquis (last dose 9PM last night), polymorphic ventricular tachycardia, aortic atherosclerosis, moderate nonobstructive CAD with severe plaque burden, obstructive sleep apnea on CPAP, obesity currently on semaglutide --presents to the emergency department for evaluation of abdominal pain starting a day ago.  Pain is in the right lower quadrant, worsening, much worse with movement.  Patient has had associated nausea without vomiting.  He states that symptoms started with significant diarrhea after eating [employer] 2 nights ago.  No active chest pain or shortness of breath.  He states that he feels generally very bad.       Home Medications Prior to Admission medications   Medication Sig Start Date End Date Taking? Authorizing Provider  albuterol (VENTOLIN HFA) 108 (90 Base) MCG/ACT inhaler Inhale 2 puffs into the lungs every 6 (six) hours as needed for wheezing or shortness of breath. 08/22/23   Lula Olszewski, MD  apixaban (ELIQUIS) 5 MG TABS tablet Take 1 tablet (5 mg total) by mouth 2 (two) times daily. 08/29/23   Croitoru, Mihai, MD  benzonatate (TESSALON) 200 MG capsule Take 1 capsule (200 mg total) by mouth 2 (two) times daily as needed for cough. 05/09/23   Ardith Dark, MD  carvedilol (COREG) 25 MG tablet Take 0.5 tablets (12.5 mg total) by mouth every morning AND 1 tablet (25 mg total) every evening. 12/09/22   Croitoru, Mihai, MD  Cyanocobalamin  (B-12) 1000 MCG CAPS Take 1 tablet by mouth daily at 6 (six) AM. 03/06/23   Lula Olszewski, MD  dapagliflozin propanediol (FARXIGA) 10 MG TABS tablet Take 1 tablet (10 mg total) by mouth daily before breakfast. 08/29/23   Croitoru, Mihai, MD  famotidine-calcium carbonate-magnesium hydroxide (PEPCID COMPLETE) 10-800-165 MG chewable tablet Chew 1 tablet by mouth 2 (two) times daily as needed. 02/18/23   Lula Olszewski, MD  ipratropium-albuterol (DUONEB) 0.5-2.5 (3) MG/3ML SOLN Take 3 mLs by nebulization every 4 (four) hours as needed. 08/22/23   Lula Olszewski, MD  ketorolac (TORADOL) 10 MG tablet Take 1 tablet (10 mg total) by mouth every 6 (six) hours as needed. 07/22/23   Lula Olszewski, MD  meloxicam (MOBIC) 15 MG tablet TAKE 1 TABLET (15 MG TOTAL) BY MOUTH DAILY. 09/02/23   Candelaria Stagers, DPM  methylPREDNISolone (MEDROL DOSEPAK) 4 MG TBPK tablet Take as directed 08/06/23   Candelaria Stagers, DPM  Multiple Vitamin (MULTIVITAMIN WITH MINERALS) TABS tablet Take 1 tablet by mouth in the morning.    [provider]  predniSONE (DELTASONE) 20 MG tablet Take 2 pills for 3 days, 1 pill for 4 days 08/22/23   Lula Olszewski, MD  rosuvastatin (CRESTOR) 20 MG tablet TAKE 1 TABLET BY MOUTH EVERY DAY 10/14/22   Laurey Morale, MD  sacubitril-valsartan (ENTRESTO) 97-103 MG Take 1 tablet by mouth 2 (two) times daily. 04/18/23   Lanier Prude, MD  Semaglutide-Weight Management 0.5 MG/0.5ML SOAJ Inject 0.5 mg into the skin once a week.    [provider]  sertraline (ZOLOFT) 100 MG tablet TAKE 1 TABLET BY MOUTH EVERY DAY 05/05/23   Shelva Majestic, MD  sildenafil (VIAGRA) 100 MG tablet TAKE 1 TABLET (100 MG TOTAL) BY MOUTH AT BEDTIME AS NEEDED FOR ERECTILE DYSFUNCTION. 01/27/23   Croitoru, Mihai, MD  spironolactone (ALDACTONE) 50 MG tablet TAKE 1 TABLET BY MOUTH EVERY DAY Patient taking differently: Take 25 mg by mouth daily. 02/04/23   Laurey Morale, MD  torsemide (DEMADEX) 20 MG tablet  TAKE 3 TABLETS BY MOUTH EVERY DAY 09/04/23   Laurey Morale, MD  traZODone (DESYREL) 50 MG tablet Take 1 tablet (50 mg total) by mouth at bedtime. 03/14/23   Shelva Majestic, MD      Allergies    Chlorhexidine, Cymbalta [duloxetine hcl], and Hydrocodone    Review of Systems   Review of Systems  Physical Exam Updated Vital Signs BP 91/62 (BP Location: Right Arm)   Pulse 87   Temp 98.5 F (36.9 C)   Resp 18   Ht 5\' 11"  (1.803 m)   Wt 120.8 kg   SpO2 96%   BMI 37.14 kg/m  Physical Exam Vitals and nursing note reviewed.  Constitutional:      General: He is not in acute distress.    Appearance: He is well-developed.  HENT:     Head: Normocephalic and atraumatic.  Eyes:     General:        Right eye: No discharge.        Left eye: No discharge.     Conjunctiva/sclera: Conjunctivae normal.  Cardiovascular:     Rate and Rhythm: Normal rate and regular rhythm.     Heart sounds: Normal heart sounds.  Pulmonary:     Effort: Pulmonary effort is normal.     Breath sounds: Normal breath sounds.  Abdominal:     Palpations: Abdomen is soft.     Tenderness: There is abdominal tenderness in the right lower quadrant, periumbilical area and suprapubic area. There is guarding. There is no rebound. Negative signs include Murphy's sign, Rovsing's sign and McBurney's sign.  Musculoskeletal:     Cervical back: Normal range of motion and neck supple.  Skin:    General: Skin is warm and dry.  Neurological:     Mental Status: He is alert.     ED Results / Procedures / Treatments   Labs (all labs ordered are listed, but only abnormal results are displayed) Labs Reviewed  COMPREHENSIVE METABOLIC PANEL - Abnormal; Notable for the following components:      Result Value   Sodium 134 (*)    Chloride 93 (*)    Glucose, Bld 130 (*)    Creatinine, Ser 1.35 (*)    GFR, Estimated 59 (*)    All other components within normal limits  CBC - Abnormal; Notable for the following components:    WBC 26.3 (*)    RBC 4.05 (*)    All other components within normal limits  URINALYSIS, ROUTINE W REFLEX MICROSCOPIC - Abnormal; Notable for the following components:   Specific Gravity, Urine >1.046 (*)    Glucose, UA 250 (*)    Protein, ur 30 (*)    All other components within normal limits  TROPONIN I (HIGH SENSITIVITY) - Abnormal; Notable for the following components:   Troponin I (High Sensitivity) 20 (*)    All other components within normal limits  TROPONIN I (HIGH SENSITIVITY) - Abnormal; Notable for the following components:   Troponin I (High  Sensitivity) 19 (*)    All other components within normal limits  LIPASE, BLOOD  LACTIC ACID, PLASMA    EKG None  Radiology CT ABDOMEN PELVIS W CONTRAST Result Date: 09/05/2023 CLINICAL DATA:  Right lower quadrant abdominal pain. Generalized abdominal pain since yesterday. Diarrhea for 2 days. EXAM: CT ABDOMEN AND PELVIS WITH CONTRAST TECHNIQUE: Multidetector CT imaging of the abdomen and pelvis was performed using the standard protocol following bolus administration of intravenous contrast. RADIATION DOSE REDUCTION: This exam was performed according to the departmental dose-optimization program which includes automated exposure control, adjustment of the mA and/or kV according to patient size and/or use of iterative reconstruction technique. CONTRAST:  OMNIPAQUE IOHEXOL 300 MG/ML  SOLN COMPARISON:  06/26/2022 FINDINGS: Lower chest: Lung bases are clear. Hepatobiliary: Focal lesion in the lateral segment left lobe of liver measuring 3.5 cm diameter. The lesion appears unchanged since previous study. There is a thin enhancing capsule suggested. Although the lesion does appear stable since the previous study, this could still represent a metastasis or hepatocellular carcinoma and follow-up with elective MRI of the liver is recommended for best characterization. Gallbladder and bile ducts are normal. Pancreas: Unremarkable. No pancreatic  ductal dilatation or surrounding inflammatory changes. Spleen: Normal in size without focal abnormality. Adrenals/Urinary Tract: Adrenal glands are unremarkable. Kidneys are normal, without renal calculi, focal lesion, or hydronephrosis. Bladder is decompressed. Stomach/Bowel: Stomach, small bowel, and colon are mostly decompressed. The appendix is mildly distended with diameter measuring 12 mm. There is a small appendicolith. There is stranding around the appendix. Changes are consistent with early acute appendicitis. No abscess. Vascular/Lymphatic: Aortic atherosclerosis. No enlarged abdominal or pelvic lymph nodes. Reproductive: Prostate is unremarkable. Other: No abdominal wall hernia or abnormality. No abdominopelvic ascites. Musculoskeletal: Degenerative changes in the spine. Kyphoplasty changes at T12. Bilateral hip arthroplasties. IMPRESSION: 1. Changes of acute appendicitis with distended appendix, appendicolith, and periappendiceal stranding. No abscess. 2. 3.5 cm diameter hypoenhancing lesion in the lateral segment left lobe of the liver. Although this appears stable from prior study, differential diagnosis would still included hepatocellular carcinoma or metastasis based on CT appearance. Recommend follow-up with elective MRI of the liver for better characterization. 3. Aortic atherosclerosis. Electronically Signed   By: Burman Nieves M.D.   On: 09/05/2023 18:11   DG Chest 2 View Result Date: 09/05/2023 CLINICAL DATA:  Abdomen pain EXAM: CHEST - 2 VIEW COMPARISON:  11/11/2022 FINDINGS: Bilateral shoulder replacements. Left-sided pacing device as before. Streaky bibasilar atelectasis. Stable cardiomediastinal silhouette. Aortic atherosclerosis. No pneumothorax. IMPRESSION: Streaky bibasilar atelectasis. Electronically Signed   By: Jasmine Pang M.D.   On: 09/05/2023 16:55    Procedures Procedures    Medications Ordered in ED Medications  sodium chloride 0.9 % bolus 1,000 mL ( Intravenous  Stopped 09/05/23 1728)  HYDROmorphone (DILAUDID) injection 0.5 mg (0.5 mg Intravenous Given 09/05/23 1630)  ondansetron (ZOFRAN) injection 4 mg (4 mg Intravenous Given 09/05/23 1630)  iohexol (OMNIPAQUE) 300 MG/ML solution 100 mL (100 mLs Intravenous Contrast Given 09/05/23 1610)  piperacillin-tazobactam (ZOSYN) IVPB 3.375 g (0 g Intravenous Stopped 09/05/23 1820)  HYDROmorphone (DILAUDID) injection 0.5 mg (0.5 mg Intravenous Given 09/05/23 1832)    ED Course/ Medical Decision Making/ A&P    Patient seen and examined. History obtained directly from patient and wife at bedside.  I reviewed previous cardiology notes as well.  Labs/EKG: Personally reviewed and interpreted Labs ordered thus far from triage including CBC with leukocytosis white blood cell count 26.3 with hemoglobin 13.2; CMP sodium 134, chloride  93, glucose 130, creatinine 1.35 with a normal BUN, normal transaminases; lipase normal.  I added lactate.   Imaging: Reviewed chest x-ray, no obvious infiltrate,  Medications/Fluids: Ordered: IV Zosyn, IV Dilaudid, IV Zofran.   Most recent vital signs reviewed and are as follows: BP 91/62 (BP Location: Right Arm)   Pulse 87   Temp 98.5 F (36.9 C)   Resp 18   Ht 5\' 11"  (1.803 m)   Wt 120.8 kg   SpO2 96%   BMI 37.14 kg/m   Initial impression: Right-sided abdominal pain, given significant pain, elevated blood cell count, and guarding, will initiate IV antibiotic therapy.   6:34 PM Reassessment performed. Patient appears stable.  Pain improved, but patient asking for additional pain medication.  Labs personally reviewed and interpreted including: Lactate normal at 1.7, troponin 20 >> 19.  Imaging personally visualized and interpreted including: CT abdomen pelvis, agree acute appendicitis  Reviewed pertinent lab work and imaging with patient at bedside. Questions answered.   Most current vital signs reviewed and are as follows: BP 114/70 (BP Location: Right Arm)   Pulse 88    Temp 98.5 F (36.9 C) (Oral)   Resp 14   Ht 5\' 11"  (1.803 m)   Wt 120.8 kg   SpO2 94%   BMI 37.14 kg/m   Plan: Consult surgery, admit to hospital  I spoke with Dr. Dossie Der with general surgery.  Request admission to [facility] on medical service.  Page placed for hospitalist.  6:59 PM spoke with Dr. Allena Katz of [facility] hospitalist.  Patient will be admitted to [facility].                               Medical Decision Making Amount and/or Complexity of Data Reviewed Labs: ordered. Radiology: ordered.  Risk Prescription drug management. Decision regarding hospitalization.   For this patient's complaint of abdominal pain, the following conditions were considered on the differential diagnosis: gastritis/PUD, enteritis/duodenitis, appendicitis, cholelithiasis/cholecystitis, cholangitis, pancreatitis, ruptured viscus, colitis, diverticulitis, small/large bowel obstruction, proctitis, cystitis, pyelonephritis, ureteral colic, aortic dissection, aortic aneurysm. Atypical chest etiologies were also considered including ACS, PE, and pneumonia.  Patient will need admission for acute appendicitis, surgical evaluation, IV antibiotics and further medical management.        Final Clinical Impression(s) / ED Diagnoses Final diagnoses:  Acute appendicitis with localized peritonitis, without perforation, abscess, or gangrene    Rx / DC Orders ED Discharge Orders     None         Renne Crigler, PA-C 09/05/23 1900    Rolan Bucco, MD 09/05/23 2342

## 2023-09-05 NOTE — Progress Notes (Signed)
Pharmacy Antibiotic Note  John [name] is a 62 y.o. male admitted on 09/05/2023 with  appendicitis - await surgery until Eliquis washout .  Pharmacy has been consulted for Zosyn dosing.  Plan: Zosyn 3.375gm IV q8h Will f/u renal function, micro data, and pt's clinical condition  Height: 5\' 11"  (180.3 cm) Weight: 120.8 kg (266 lb 5.1 oz) IBW/kg (Calculated) : 75.3  Temp (24hrs), Avg:98.8 F (37.1 C), Min:98.5 F (36.9 C), Max:99.4 F (37.4 C)  Recent Labs  Lab 09/05/23 1428 09/05/23 1632  WBC 26.3*  --   CREATININE 1.35*  --   LATICACIDVEN  --  1.7    Estimated Creatinine Clearance: 75 mL/min (A) (by C-G formula based on SCr of 1.35 mg/dL (H)).    Allergies  Allergen Reactions   Chlorhexidine Itching   Cymbalta [Duloxetine Hcl] Nausea Only and Other (See Comments)    Night sweats     Hydrocodone Rash    Antimicrobials this admission: 12/12 Zosyn >>    Thank you for allowing pharmacy to be a part of this patient's care.  Christoper Fabian, PharmD, BCPS Please see amion for complete clinical pharmacist phone list 09/05/2023 9:40 PM

## 2023-09-05 NOTE — H&P (Signed)
History and Physical    John [name] NUU:[id number] DOB: 1960-11-18 DOA: 09/05/2023  PCP: Shelva Majestic, MD  Patient coming from: Home  I have personally briefly reviewed patient's old medical records in [facility]  Chief Complaint: Abdominal pain  HPI: John [name] is a 63 y.o. male with medical history significant for chronic HFrEF (last EF 30-35%) and polymorphic VT s/p AICD, CAD, PAF on Eliquis, HTN, asthma, OSA on CPAP who presented to the ED for evaluation of abdominal pain.  Patient states that he developed diarrhea 2 nights ago.  Yesterday he ate [employer] and afterwards developed abdominal pain, mostly localized in the right lower quadrant.  Pain was initially manageable but today became very severe therefore he came to the ED.  He reports some occasional shortness of breath related to recent bronchitis and asthma but not worse than usual.  He has not had any recent chest pain.  Denies fevers, chills, diaphoresis.  He reports good urine output without dysuria.  He has not seen any swelling in his extremities.  He does take Eliquis chronically and states he took the last dose evening of 12/19.  [facility] ED Course  Labs/Imaging on admission: I have personally reviewed following labs and imaging studies.  Initial vitals showed BP 91/62, pulse 87, RR 18, temp 98.5 F, SpO2 96% on room air.  SpO2 briefly dropped to 89% after receiving narcotic pain meds briefly required 2 L O2 [place] before being weaned back to room air.  Labs showed WBC 26.3, hemoglobin 13.2, platelets 243,000, sodium 134, potassium 3.9, bicarb 30, BUN 18, creatinine 1.35, serum glucose 130, LFTs within normal limits, lipase 14, troponin 20 > 19.  Lactic acid 1.7.  UA negative for UTI.  CT abdomen/pelvis with contrast showed changes of acute appendicitis with distended appendix, appendicolith, and periappendiceal stranding.  No abscess.  3.5 cm diameter hypoenhancing lesion in the lateral  segment left lobe of the liver noted.  Appears stable from prior study.  Differential includes hepatocellular carcinoma or metastasis based on CT appearance follow-up with elective MRI of the liver recommended.  To be chest x-ray showed streaky bibasilar atelectasis.  Patient was given 1 L normal saline, Zosyn, IV Dilaudid.  EDP discussed with general surgery Dr. Dossie Der who recommended medical admission to [facility].  The hospitalist service was consulted to admit for further evaluation and management.  Review of Systems: All systems reviewed and are negative except as documented in history of present illness above.   Past Medical History:  Diagnosis Date   AICD (automatic cardioverter/defibrillator) present    [employer]   Anxiety    Ativan   Chronic combined systolic and diastolic CHF, NYHA class 3 (HCC) 10/2016   Nonischemic cardiomyopathy. EF 20-25%.   Chronic left hip pain    Depression    history of   Dysrhythmia    A-FIB   GI bleed 03/2016   Headache    Hypertensive heart disease with combined systolic and diastolic congestive heart failure (HCC) 10/2016   Nonischemic cardiomyopathy (HCC) 10/2016   Echo with EF 20-25%. Cardiac catheterization with no CAD. LVEDP was 41 mmHg, PCWP 36 mmHg   Osteoarthritis    right shoulder   Right hand fracture    Seasonal allergies    Sleep apnea    wears a CPAP   Wears glasses     Past Surgical History:  Procedure Laterality Date   CARDIAC CATHETERIZATION     CARDIOVERSION N/A 09/01/2019   Procedure: CARDIOVERSION;  Surgeon: Laurey Morale, MD;  Location: [facility] ENDOSCOPY;  Service: Cardiovascular;  Laterality: N/A;   CARDIOVERSION N/A 10/07/2022   Procedure: CARDIOVERSION;  Surgeon: Thomasene Ripple, DO;  Location: [facility] ENDOSCOPY;  Service: Cardiovascular;  Laterality: N/A;   ESOPHAGOGASTRODUODENOSCOPY (EGD) WITH PROPOFOL N/A 03/20/2016   Procedure: ESOPHAGOGASTRODUODENOSCOPY (EGD) WITH PROPOFOL;  Surgeon: Charlott Rakes, MD;  Location:  [facility] ENDOSCOPY;  Service: Endoscopy;  Laterality: N/A;   ICD IMPLANT N/A 05/06/2018   Procedure: ICD IMPLANT;  Surgeon: Thurmon Fair, MD;  Location: [facility] INVASIVE CV LAB;  Service: Cardiovascular;  Laterality: N/A;   IRRIGATION AND DEBRIDEMENT SHOULDER Left 11/11/2019   Procedure: LEFT SHOULDER IRRIGATION AND DEBRIDEMENT WITH POLY EXCHANGE;  Surgeon: Jones Broom, MD;  Location: [facility] ORS;  Service: Orthopedics;  Laterality: Left;   KYPHOPLASTY N/A 02/22/2021   Procedure: KYPHOPLASTY T12;  Surgeon: Venita Lick, MD;  Location: [facility] OR;  Service: Orthopedics;  Laterality: N/A;  90 mins   LEAD REVISION/REPAIR N/A 11/11/2022   Procedure: LEAD atrial lead insertion;  Surgeon: Lanier Prude, MD;  Location: [facility] INVASIVE CV LAB;  Service: Cardiovascular;  Laterality: N/A;   ORIF WRIST FRACTURE Left 02/04/2022   Procedure: OPEN REDUCTION INTERNAL FIXATION (ORIF) WRIST FRACTURE;  Surgeon: Bradly Bienenstock, MD;  Location: [facility] OR;  Service: Orthopedics;  Laterality: Left;  with IV sedation   PPM GENERATOR CHANGEOUT N/A 11/11/2022   Procedure: PPM GENERATOR CHANGEOUT;  Surgeon: Lanier Prude, MD;  Location: [facility] INVASIVE CV LAB;  Service: Cardiovascular;  Laterality: N/A;   RIGHT/LEFT HEART CATH AND CORONARY ANGIOGRAPHY N/A 10/21/2016   Procedure: Right/Left Heart Cath and Coronary Angiography;  Surgeon: Runell Gess, MD;  Location: [facility] INVASIVE CV LAB;  Service: Cardiovascular: Angiographically normal coronary arteries. PCWP 33-36 mmHg, LVEDP 41 mmHg. PA pressure 60/35, mean 46 mmHg.  Cardiac output/cardiac index-3.93 /1.76 Hiram Comber), 3.49/1.57 (thermodilution)   TEE WITHOUT CARDIOVERSION N/A 09/01/2019   Procedure: TRANSESOPHAGEAL ECHOCARDIOGRAM (TEE);  Surgeon: Laurey Morale, MD;  Location: [facility] ENDOSCOPY;  Service: Cardiovascular;  Laterality: N/A;   TOTAL HIP ARTHROPLASTY Left 03/27/2015   Procedure: LEFT TOTAL HIP ARTHROPLASTY ANTERIOR APPROACH;  Surgeon: Samson Frederic, MD;  Location: [facility] OR;  Service:  Orthopedics;  Laterality: Left;   TOTAL HIP ARTHROPLASTY Right 05/03/2021   Procedure: TOTAL HIP ARTHROPLASTY ANTERIOR APPROACH;  Surgeon: Samson Frederic, MD;  Location: [facility] ORS;  Service: Orthopedics;  Laterality: Right;   TOTAL SHOULDER ARTHROPLASTY Right 11/24/2015   Procedure: RIGHT TOTAL SHOULDER ARTHROPLASTY;  Surgeon: Beverely Low, MD;  Location: [facility] OR;  Service: Orthopedics;  Laterality: Right;   TOTAL SHOULDER ARTHROPLASTY Left 10/19/2019   Procedure: REVERSE TOTAL SHOULDER ARTHROPLASTY;  Surgeon: Jones Broom, MD;  Location: [facility] ORS;  Service: Orthopedics;  Laterality: Left;   TRANSTHORACIC ECHOCARDIOGRAM  10/2016   EF 20-25%. Diffuse hypokinesis but akinesis of the entire inferoseptal wall and apical wall. Moderate biatrial enlargement. PA pressure estimated 64 mmHg.   WISDOM TOOTH EXTRACTION      Social History:  reports that he has never smoked. He has never used smokeless tobacco. He reports current alcohol use of about 12.0 standard drinks of alcohol per week. He reports that he does not use drugs.  Allergies  Allergen Reactions   Chlorhexidine Itching   Cymbalta [Duloxetine Hcl] Nausea Only and Other (See Comments)    Night sweats     Hydrocodone Rash    Family History  Problem Relation Age of Onset   Dementia Mother        died at 31  Lung cancer Father        smoker   Congenital heart disease Sister        lived to 9    Healthy Brother    Healthy Sister    Prostate cancer Brother        possible cancer   Healthy Brother    Healthy Brother      Prior to Admission medications   Medication Sig Start Date End Date Taking? Authorizing Provider  apixaban (ELIQUIS) 5 MG TABS tablet Take 1 tablet (5 mg total) by mouth 2 (two) times daily. 08/29/23  Yes Croitoru, Mihai, MD  carvedilol (COREG) 25 MG tablet Take 0.5 tablets (12.5 mg total) by mouth every morning AND 1 tablet (25 mg total) every evening. 12/09/22  Yes Croitoru, Mihai, MD  dapagliflozin propanediol  (FARXIGA) 10 MG TABS tablet Take 1 tablet (10 mg total) by mouth daily before breakfast. 08/29/23  Yes Croitoru, Mihai, MD  famotidine-calcium carbonate-magnesium hydroxide (PEPCID COMPLETE) 10-800-165 MG chewable tablet Chew 1 tablet by mouth 2 (two) times daily as needed. 02/18/23  Yes Lula Olszewski, MD  ipratropium-albuterol (DUONEB) 0.5-2.5 (3) MG/3ML SOLN Take 3 mLs by nebulization every 4 (four) hours as needed. 08/22/23  Yes Lula Olszewski, MD  meloxicam (MOBIC) 15 MG tablet TAKE 1 TABLET (15 MG TOTAL) BY MOUTH DAILY. 09/02/23  Yes Candelaria Stagers, DPM  Multiple Vitamin (MULTIVITAMIN WITH MINERALS) TABS tablet Take 1 tablet by mouth in the morning.   Yes [provider]  rosuvastatin (CRESTOR) 20 MG tablet TAKE 1 TABLET BY MOUTH EVERY DAY 10/14/22  Yes Laurey Morale, MD  sacubitril-valsartan (ENTRESTO) 97-103 MG Take 1 tablet by mouth 2 (two) times daily. 04/18/23  Yes Lanier Prude, MD  Semaglutide-Weight Management 0.5 MG/0.5ML SOAJ Inject 0.5 mg into the skin once a week.   Yes [provider]  sertraline (ZOLOFT) 100 MG tablet TAKE 1 TABLET BY MOUTH EVERY DAY 05/05/23  Yes Shelva Majestic, MD  spironolactone (ALDACTONE) 50 MG tablet TAKE 1 TABLET BY MOUTH EVERY DAY Patient taking differently: Take 25 mg by mouth daily. 02/04/23  Yes Laurey Morale, MD  torsemide (DEMADEX) 20 MG tablet TAKE 3 TABLETS BY MOUTH EVERY DAY 09/04/23  Yes Laurey Morale, MD  traZODone (DESYREL) 50 MG tablet Take 1 tablet (50 mg total) by mouth at bedtime. 03/14/23  Yes Shelva Majestic, MD  albuterol (VENTOLIN HFA) 108 (90 Base) MCG/ACT inhaler Inhale 2 puffs into the lungs every 6 (six) hours as needed for wheezing or shortness of breath. 08/22/23   Lula Olszewski, MD  benzonatate (TESSALON) 200 MG capsule Take 1 capsule (200 mg total) by mouth 2 (two) times daily as needed for cough. 05/09/23   Ardith Dark, MD  Cyanocobalamin (B-12) 1000 MCG CAPS Take 1 tablet by mouth daily at 6  (six) AM. 03/06/23   Lula Olszewski, MD  ketorolac (TORADOL) 10 MG tablet Take 1 tablet (10 mg total) by mouth every 6 (six) hours as needed. 07/22/23   Lula Olszewski, MD  methylPREDNISolone (MEDROL DOSEPAK) 4 MG TBPK tablet Take as directed 08/06/23   Candelaria Stagers, DPM  predniSONE (DELTASONE) 20 MG tablet Take 2 pills for 3 days, 1 pill for 4 days 08/22/23   Lula Olszewski, MD  sildenafil (VIAGRA) 100 MG tablet TAKE 1 TABLET (100 MG TOTAL) BY MOUTH AT BEDTIME AS NEEDED FOR ERECTILE DYSFUNCTION. 01/27/23   Croitoru, Rachelle Hora, MD    Physical  Exam: Vitals:   09/05/23 1417 09/05/23 1635 09/05/23 1710 09/05/23 2049  BP:  114/70  106/66  Pulse:  88  78  Resp:  14  20  Temp:  98.5 F (36.9 C)  99.4 F (37.4 C)  TempSrc:  Oral  Oral  SpO2:  96% 94% 90%  Weight: 120.8 kg     Height: 5\' 11"  (1.803 m)      Constitutional: Resting in bed, NAD, calm, comfortable Eyes: EOMI, lids and conjunctivae normal ENMT: Mucous membranes are moist. Posterior pharynx clear of any exudate or lesions.Normal dentition.  Neck: normal, supple, no masses. Respiratory: clear to auscultation bilaterally, no wheezing, no crackles. Normal respiratory effort. No accessory muscle use.  Cardiovascular: Regular rate and rhythm, no murmurs / rubs / gallops. No extremity edema. 2+ pedal pulses. Abdomen: Mild RLQ tenderness, no masses palpated.  Musculoskeletal: no clubbing / cyanosis. No joint deformity upper and lower extremities. Good ROM, no contractures. Normal muscle tone.  Skin: no rashes, lesions, ulcers. No induration Neurologic: Sensation intact. Strength 5/5 in all 4.  Psychiatric: Normal judgment and insight. Alert and oriented x 3. Normal mood.   EKG: Personally reviewed. Sinus rhythm, rate 91, occasional PVCs, low voltage, nonspecific T wave changes.  PVCs are new when compared to previous.  Assessment/Plan Principal Problem:   Acute appendicitis Active Problems:   Essential hypertension    Hyperlipidemia   OSA (obstructive sleep apnea)   Paroxysmal atrial fibrillation (HCC)   Chronic combined systolic and diastolic CHF, NYHA class 3 (HCC)   S/P ICD (internal cardiac defibrillator) procedure   GAD (generalized anxiety disorder)   CAD (coronary artery disease)   Liver lesion, left lobe   John [name] is a 62 y.o. male with medical history significant for chronic HFrEF (last EF 30-35%) and polymorphic VT s/p AICD, CAD, PAF on Eliquis, HTN, asthma, OSA on CPAP who is admitted with acute appendicitis.  Assessment and Plan: Acute appendicitis: No evidence of abscess on CT imaging.  Chronic cardiac comorbidities are stable although do place patient at increased perioperative cardiac risk at his baseline. -Keep n.p.o. -Continue IV Zosyn -Hold Eliquis (last dose 12/19 p.m. per patient) -Further management as per general surgery  Paroxysmal atrial fibrillation: Stable, rate is controlled.  Continue Coreg.  Eliquis on hold.  Chronic combined systolic and diastolic CHF History of polymorphic VT s/p AICD: CHF appears compensated.  Last EF 30-35%. -Continue Coreg, Entresto, torsemide -Hold Farxiga and spironolactone for now -Monitor I/O's and daily weights  Coronary artery disease: Stable, denies chest pain.  Continue rosuvastatin.  Eliquis on hold as above.  Hypertension: BP stable.  Continue Coreg, Entresto.  Spironolactone on hold for now.  Hyperlipidemia: Continue rosuvastatin.  Asthma: Continue albuterol as needed.  Depression/anxiety: Continue Zoloft.  OSA: Continue CPAP nightly.  Liver lesion: A 3.5 cm diameter hypoenhancing lesion in the lateral segment left lobe of the liver noted on CT A/P which appears stable from prior study.  Differential diagnosis still included hepatocellular carcinoma or metastasis based on imaging appearance.  Follow-up elective MRI of the liver is recommended for better characterization.  Findings discussed with patient on  admission.   DVT prophylaxis: SCDs Start: 09/05/23 2226 Code Status: Full code, confirmed with patient on admission Family Communication: Discussed with patient, he has discussed with family Disposition Plan: From home and likely discharge to home pending clinical progress Consults called: General Surgery Severity of Illness: The appropriate patient status for this patient is INPATIENT. Inpatient status is judged to be reasonable  and necessary in order to provide the required intensity of service to ensure the patient's safety. The patient's presenting symptoms, physical exam findings, and initial radiographic and laboratory data in the context of their chronic comorbidities is felt to place them at high risk for further clinical deterioration. Furthermore, it is not anticipated that the patient will be medically stable for discharge from the hospital within 2 midnights of admission.   * I certify that at the point of admission it is my clinical judgment that the patient will require inpatient hospital care spanning beyond 2 midnights from the point of admission due to high intensity of service, high risk for further deterioration and high frequency of surveillance required.Darreld Mclean MD [facility]  If 7PM-7AM, please contact night-coverage [email or web address]  09/05/2023, 10:38 PM

## 2023-09-05 NOTE — Hospital Course (Signed)
John [name] is a 62 y.o. male with medical history significant for chronic HFrEF (last EF 30-35%) and polymorphic VT s/p AICD, CAD, PAF on Eliquis, HTN, asthma, OSA on CPAP who is admitted with acute appendicitis.

## 2023-09-05 NOTE — ED Notes (Signed)
RT Note: Patient had to be placed on 2lpm [place] due to a decrease in oxygen saturation. The patient appeared to have this decrease in saturation after getting pain medication

## 2023-09-05 NOTE — Progress Notes (Signed)
Plan of Care Note for accepted transfer   Patient: John [name] MRN: [id number]   DOA: 09/05/2023  Facility requesting transfer: [facility] Requesting Provider: Renne Crigler, PA Reason for transfer: Appendicitis Facility course:  62 year old male with HFrEF (last EF 30-35%) and polymorphic VT s/p AICD, CAD, PAF on Eliquis, HTN, asthma, OSA on CPAP who presented to the ED for evaluation of RLQ abdominal pain.  CT A/P showed acute appendicitis without abscess.  Also noted 3.5 cm lesion to the left lobe of the liver.  Patient developed mild hypoxia after receiving pain meds otherwise vitals overall stable.  Labs notable for WBC 26.3, troponin 20 > 19.  Patient was given 1 L normal saline, IV Zosyn, Dilaudid.  EDP discussed with general surgery, Dr. Dossie Der, who recommended medical admission to [facility], no immediate surgical intervention pending Eliquis washout.    Plan of care: The patient is accepted for admission to Progressive unit, at [facility]  Author: Darreld Mclean, MD 09/05/2023  Check [email or web address] for on-call coverage.  Nursing staff, Please call [facility] Admits & Consults System-Wide number on Amion as soon as patient's arrival, so appropriate admitting provider can evaluate the pt.

## 2023-09-05 NOTE — ED Triage Notes (Signed)
Pt via pov from home with abdominal pain that began yesterday. He also reports diarrhea 2 nights ago. Denies n/v. Pt takes semaglutide shots for weight loss. Last dose was 1 week ago. Pt alert & oriented, nad noted.

## 2023-09-05 NOTE — Plan of Care (Signed)
New Admission :

## 2023-09-06 ENCOUNTER — Inpatient Hospital Stay (HOSPITAL_COMMUNITY): Payer: Medicare Other | Admitting: Anesthesiology

## 2023-09-06 ENCOUNTER — Encounter (HOSPITAL_COMMUNITY)
Admission: EM | Disposition: A | Discharge status: Home / Self Care | Payer: Self-pay | Source: Home / Self Care | Attending: Internal Medicine

## 2023-09-06 ENCOUNTER — Encounter (HOSPITAL_COMMUNITY): Payer: Self-pay | Admitting: Orthopedic Surgery

## 2023-09-06 ENCOUNTER — Other Ambulatory Visit: Payer: Self-pay

## 2023-09-06 DIAGNOSIS — I5042 Chronic combined systolic (congestive) and diastolic (congestive) heart failure: Secondary | ICD-10-CM

## 2023-09-06 DIAGNOSIS — I10 Essential (primary) hypertension: Secondary | ICD-10-CM | POA: Diagnosis not present

## 2023-09-06 DIAGNOSIS — I509 Heart failure, unspecified: Secondary | ICD-10-CM

## 2023-09-06 DIAGNOSIS — K353 Acute appendicitis with localized peritonitis, without perforation or gangrene: Secondary | ICD-10-CM | POA: Diagnosis not present

## 2023-09-06 DIAGNOSIS — E7800 Pure hypercholesterolemia, unspecified: Secondary | ICD-10-CM

## 2023-09-06 DIAGNOSIS — K37 Unspecified appendicitis: Secondary | ICD-10-CM

## 2023-09-06 DIAGNOSIS — F418 Other specified anxiety disorders: Secondary | ICD-10-CM | POA: Diagnosis not present

## 2023-09-06 DIAGNOSIS — I110 Hypertensive heart disease with heart failure: Secondary | ICD-10-CM

## 2023-09-06 HISTORY — PX: LAPAROSCOPIC APPENDECTOMY: SHX408

## 2023-09-06 LAB — CBC
HCT: 37.5 % — ABNORMAL LOW (ref 39.0–52.0)
Hemoglobin: 12.1 g/dL — ABNORMAL LOW (ref 13.0–17.0)
MCH: 32.6 pg (ref 26.0–34.0)
MCHC: 32.3 g/dL (ref 30.0–36.0)
MCV: 101.1 fL — ABNORMAL HIGH (ref 80.0–100.0)
Platelets: 199 10*3/uL (ref 150–400)
RBC: 3.71 MIL/uL — ABNORMAL LOW (ref 4.22–5.81)
RDW: 13.2 % (ref 11.5–15.5)
WBC: 22.1 10*3/uL — ABNORMAL HIGH (ref 4.0–10.5)
nRBC: 0 % (ref 0.0–0.2)

## 2023-09-06 LAB — MAGNESIUM: Magnesium: 2.3 mg/dL (ref 1.7–2.4)

## 2023-09-06 LAB — BASIC METABOLIC PANEL
Anion gap: 4 — ABNORMAL LOW (ref 5–15)
BUN: 16 mg/dL (ref 8–23)
CO2: 28 mmol/L (ref 22–32)
Calcium: 8.4 mg/dL — ABNORMAL LOW (ref 8.9–10.3)
Chloride: 99 mmol/L (ref 98–111)
Creatinine, Ser: 1.43 mg/dL — ABNORMAL HIGH (ref 0.61–1.24)
GFR, Estimated: 55 mL/min — ABNORMAL LOW (ref >60)
Glucose, Bld: 126 mg/dL — ABNORMAL HIGH (ref 70–99)
Potassium: 3.7 mmol/L (ref 3.5–5.1)
Sodium: 131 mmol/L — ABNORMAL LOW (ref 135–145)

## 2023-09-06 LAB — HIV ANTIBODY (ROUTINE TESTING W REFLEX): HIV Screen 4th Generation wRfx: NONREACTIVE

## 2023-09-06 SURGERY — APPENDECTOMY, LAPAROSCOPIC
Anesthesia: General

## 2023-09-06 MED ORDER — OXYCODONE HCL 5 MG/5ML PO SOLN: 5.0000 mg | Freq: Once | ORAL | Status: DC | PRN

## 2023-09-06 MED ORDER — ALBUMIN HUMAN 5 % IV SOLN
12.5000 g | Freq: Once | INTRAVENOUS | Status: AC
  Administered 2023-09-06: 12.5 g via INTRAVENOUS

## 2023-09-06 MED ORDER — ONDANSETRON HCL 4 MG/2ML IJ SOLN
INTRAMUSCULAR | Status: DC | PRN
  Administered 2023-09-06: 4 mg via INTRAVENOUS

## 2023-09-06 MED ORDER — LIDOCAINE 2% (20 MG/ML) 5 ML SYRINGE
INTRAMUSCULAR | Status: AC
  Filled 2023-09-06: qty 5

## 2023-09-06 MED ORDER — ORAL CARE MOUTH RINSE
15.0000 mL | Freq: Once | OROMUCOSAL | Status: AC
Start: 2023-09-06 — End: 2023-09-06
  Administered 2023-09-06: 15 mL via OROMUCOSAL

## 2023-09-06 MED ORDER — HYDROCORTISONE 0.5 % EX CREA
TOPICAL_CREAM | CUTANEOUS | Status: DC | PRN
  Filled 2023-09-06: qty 28.35

## 2023-09-06 MED ORDER — SODIUM CHLORIDE 0.9 % IR SOLN
Status: DC | PRN
  Administered 2023-09-06: 1000 mL

## 2023-09-06 MED ORDER — BUPIVACAINE-EPINEPHRINE 0.25% -1:200000 IJ SOLN
INTRAMUSCULAR | Status: DC | PRN
  Administered 2023-09-06: 30 mL

## 2023-09-06 MED ORDER — ROCURONIUM BROMIDE 10 MG/ML (PF) SYRINGE
PREFILLED_SYRINGE | INTRAVENOUS | Status: DC | PRN
  Administered 2023-09-06: 50 mg via INTRAVENOUS

## 2023-09-06 MED ORDER — FENTANYL CITRATE (PF) 250 MCG/5ML IJ SOLN
INTRAMUSCULAR | Status: DC | PRN
  Administered 2023-09-06: 50 ug via INTRAVENOUS
  Administered 2023-09-06 (×2): 100 ug via INTRAVENOUS

## 2023-09-06 MED ORDER — DEXAMETHASONE SODIUM PHOSPHATE 10 MG/ML IJ SOLN
INTRAMUSCULAR | Status: DC | PRN
  Administered 2023-09-06: 10 mg via INTRAVENOUS

## 2023-09-06 MED ORDER — 0.9 % SODIUM CHLORIDE (POUR BTL) OPTIME
TOPICAL | Status: DC | PRN
  Administered 2023-09-06: 1000 mL

## 2023-09-06 MED ORDER — PHENYLEPHRINE HCL-NACL 20-0.9 MG/250ML-% IV SOLN
INTRAVENOUS | Status: DC | PRN
  Administered 2023-09-06: 50 ug/min via INTRAVENOUS

## 2023-09-06 MED ORDER — ONDANSETRON HCL 4 MG/2ML IJ SOLN: 4.0000 mg | Freq: Four times a day (QID) | INTRAMUSCULAR | Status: DC | PRN

## 2023-09-06 MED ORDER — PROPOFOL 10 MG/ML IV BOLUS
INTRAVENOUS | Status: DC | PRN
  Administered 2023-09-06: 150 mg via INTRAVENOUS

## 2023-09-06 MED ORDER — PHENYLEPHRINE 80 MCG/ML (10ML) SYRINGE FOR IV PUSH (FOR BLOOD PRESSURE SUPPORT)
PREFILLED_SYRINGE | INTRAVENOUS | Status: DC | PRN
  Administered 2023-09-06 (×2): 80 ug via INTRAVENOUS

## 2023-09-06 MED ORDER — FENTANYL CITRATE (PF) 100 MCG/2ML IJ SOLN
INTRAMUSCULAR | Status: AC
  Filled 2023-09-06: qty 2

## 2023-09-06 MED ORDER — OXYCODONE HCL 5 MG PO TABS: 5.0000 mg | ORAL_TABLET | Freq: Once | ORAL | Status: DC | PRN

## 2023-09-06 MED ORDER — ROCURONIUM BROMIDE 10 MG/ML (PF) SYRINGE
PREFILLED_SYRINGE | INTRAVENOUS | Status: AC
  Filled 2023-09-06: qty 10

## 2023-09-06 MED ORDER — DIPHENHYDRAMINE HCL 25 MG PO CAPS: 25.0000 mg | ORAL_CAPSULE | Freq: Four times a day (QID) | ORAL | Status: DC | PRN

## 2023-09-06 MED ORDER — BUPIVACAINE-EPINEPHRINE (PF) 0.25% -1:200000 IJ SOLN
INTRAMUSCULAR | Status: AC
  Filled 2023-09-06: qty 30

## 2023-09-06 MED ORDER — FENTANYL CITRATE (PF) 250 MCG/5ML IJ SOLN
INTRAMUSCULAR | Status: AC
  Filled 2023-09-06: qty 5

## 2023-09-06 MED ORDER — CHLORHEXIDINE GLUCONATE 0.12 % MT SOLN: 15.0000 mL | Freq: Once | OROMUCOSAL | Status: AC

## 2023-09-06 MED ORDER — LACTATED RINGERS IV SOLN: INTRAVENOUS | Status: DC

## 2023-09-06 MED ORDER — ACETAMINOPHEN 10 MG/ML IV SOLN
1000.0000 mg | Freq: Once | INTRAVENOUS | Status: AC
  Administered 2023-09-06: 1000 mg via INTRAVENOUS

## 2023-09-06 MED ORDER — SODIUM CHLORIDE 0.9 % IV SOLN: INTRAVENOUS | Status: AC

## 2023-09-06 MED ORDER — PROPOFOL 10 MG/ML IV BOLUS
INTRAVENOUS | Status: AC
  Filled 2023-09-06: qty 20

## 2023-09-06 MED ORDER — FENTANYL CITRATE (PF) 100 MCG/2ML IJ SOLN
25.0000 ug | INTRAMUSCULAR | Status: DC | PRN
  Administered 2023-09-06 (×2): 50 ug via INTRAVENOUS

## 2023-09-06 MED ORDER — DIPHENHYDRAMINE HCL 50 MG/ML IJ SOLN
25.0000 mg | Freq: Four times a day (QID) | INTRAMUSCULAR | Status: DC | PRN
  Administered 2023-09-06: 25 mg via INTRAVENOUS
  Filled 2023-09-06: qty 1

## 2023-09-06 MED ORDER — PHENYLEPHRINE 80 MCG/ML (10ML) SYRINGE FOR IV PUSH (FOR BLOOD PRESSURE SUPPORT)
PREFILLED_SYRINGE | INTRAVENOUS | Status: AC
  Filled 2023-09-06: qty 10

## 2023-09-06 MED ORDER — ALBUMIN HUMAN 5 % IV SOLN
INTRAVENOUS | Status: AC
  Filled 2023-09-06: qty 250

## 2023-09-06 MED ORDER — DEXAMETHASONE SODIUM PHOSPHATE 10 MG/ML IJ SOLN
INTRAMUSCULAR | Status: AC
  Filled 2023-09-06: qty 1

## 2023-09-06 MED ORDER — LACTATED RINGERS IV SOLN: INTRAVENOUS | Status: DC | PRN

## 2023-09-06 MED ORDER — ONDANSETRON HCL 4 MG/2ML IJ SOLN
INTRAMUSCULAR | Status: AC
  Filled 2023-09-06: qty 2

## 2023-09-06 SURGICAL SUPPLY — 44 items
APPLIER CLIP 5 13 M/L LIGAMAX5 (MISCELLANEOUS)
BAG COUNTER SPONGE SURGICOUNT (BAG) ×1 IMPLANT
CANISTER SUCT 3000ML PPV (MISCELLANEOUS) ×1 IMPLANT
CHLORAPREP W/TINT 26 (MISCELLANEOUS) ×1 IMPLANT
CLIP APPLIE 5 13 M/L LIGAMAX5 (MISCELLANEOUS) IMPLANT
CLIP LIGATING HEMO LOK XL GOLD (MISCELLANEOUS) ×1 IMPLANT
COVER SURGICAL LIGHT HANDLE (MISCELLANEOUS) ×1 IMPLANT
CUTTER FLEX LINEAR 45M (STAPLE) IMPLANT
DERMABOND ADVANCED .7 DNX12 (GAUZE/BANDAGES/DRESSINGS) ×1 IMPLANT
DRAIN CHANNEL 19F RND (DRAIN) IMPLANT
ELECT REM PT RETURN 9FT ADLT (ELECTROSURGICAL) ×1
ELECTRODE REM PT RTRN 9FT ADLT (ELECTROSURGICAL) ×1 IMPLANT
ENDOLOOP SUT PDS II 0 18 (SUTURE) IMPLANT
EVACUATOR SILICONE 100CC (DRAIN) IMPLANT
GLOVE BIOGEL PI IND STRL 7.0 (GLOVE) ×1 IMPLANT
GLOVE SURG SS PI 7.0 STRL IVOR (GLOVE) ×1 IMPLANT
GOWN STRL REUS W/ TWL LRG LVL3 (GOWN DISPOSABLE) ×3 IMPLANT
GRASPER SUT TROCAR 14GX15 (MISCELLANEOUS) ×1 IMPLANT
IRRIG SUCT STRYKERFLOW 2 WTIP (MISCELLANEOUS) ×1
IRRIGATION SUCT STRKRFLW 2 WTP (MISCELLANEOUS) ×1 IMPLANT
KIT BASIN OR (CUSTOM PROCEDURE TRAY) ×1 IMPLANT
KIT TURNOVER KIT B (KITS) ×1 IMPLANT
NDL 22X1.5 STRL (OR ONLY) (MISCELLANEOUS) ×1 IMPLANT
NEEDLE 22X1.5 STRL (OR ONLY) (MISCELLANEOUS) ×1 IMPLANT
NS IRRIG 1000ML POUR BTL (IV SOLUTION) ×1 IMPLANT
PAD ARMBOARD 7.5X6 YLW CONV (MISCELLANEOUS) ×2 IMPLANT
POUCH RETRIEVAL ECOSAC 10 (ENDOMECHANICALS) ×1 IMPLANT
RELOAD STAPLE 45 3.5 BLU ETS (ENDOMECHANICALS) IMPLANT
RELOAD STAPLE TA45 3.5 REG BLU (ENDOMECHANICALS) IMPLANT
SCISSORS LAP 5X35 DISP (ENDOMECHANICALS) ×1 IMPLANT
SET TUBE SMOKE EVAC HIGH FLOW (TUBING) ×1 IMPLANT
SHEARS HARMONIC 36 ACE (MISCELLANEOUS) IMPLANT
SLEEVE Z-THREAD 5X100MM (TROCAR) ×1 IMPLANT
SPECIMEN JAR SMALL (MISCELLANEOUS) ×1 IMPLANT
SUT ETHILON 2 0 FS 18 (SUTURE) IMPLANT
SUT MNCRL AB 4-0 PS2 18 (SUTURE) ×1 IMPLANT
TOWEL GREEN STERILE (TOWEL DISPOSABLE) ×1 IMPLANT
TOWEL GREEN STERILE FF (TOWEL DISPOSABLE) ×1 IMPLANT
TRAY FOLEY W/BAG SLVR 14FR (SET/KITS/TRAYS/PACK) IMPLANT
TRAY LAPAROSCOPIC MC (CUSTOM PROCEDURE TRAY) ×1 IMPLANT
TROCAR Z THREAD OPTICAL 12X100 (TROCAR) ×1 IMPLANT
TROCAR Z-THREAD OPTICAL 5X100M (TROCAR) ×1 IMPLANT
WARMER LAPAROSCOPE (MISCELLANEOUS) ×1 IMPLANT
WATER STERILE IRR 1000ML POUR (IV SOLUTION) ×1 IMPLANT

## 2023-09-06 NOTE — Anesthesia Preprocedure Evaluation (Signed)
Anesthesia Evaluation  Patient identified by MRN, date of birth, ID band Patient awake    Reviewed: Allergy & Precautions, H&P , NPO status , Patient's Chart, lab work & pertinent test results  Airway Mallampati: II   Neck ROM: full    Dental   Pulmonary sleep apnea    breath sounds clear to auscultation       Cardiovascular hypertension, +CHF  + dysrhythmias Atrial Fibrillation + Cardiac Defibrillator  Rhythm:regular Rate:Normal  TTE (01/2022): EF 30-35%   Neuro/Psych  Headaches PSYCHIATRIC DISORDERS Anxiety Depression       GI/Hepatic   Endo/Other    Renal/GU Renal InsufficiencyRenal disease     Musculoskeletal  (+) Arthritis ,    Abdominal   Peds  Hematology   Anesthesia Other Findings   Reproductive/Obstetrics                             Anesthesia Physical Anesthesia Plan  ASA: 4  Anesthesia Plan: General   Post-op Pain Management:    Induction: Intravenous  PONV Risk Score and Plan: 2 and Ondansetron, Dexamethasone, Midazolam and Treatment may vary due to age or medical condition  Airway Management Planned: Oral ETT  Additional Equipment:   Intra-op Plan:   Post-operative Plan: Extubation in OR  Informed Consent: I have reviewed the patients History and Physical, chart, labs and discussed the procedure including the risks, benefits and alternatives for the proposed anesthesia with the patient or authorized representative who has indicated his/her understanding and acceptance.     Dental advisory given  Plan Discussed with: CRNA, Anesthesiologist and Surgeon  Anesthesia Plan Comments:        Anesthesia Quick Evaluation

## 2023-09-06 NOTE — Consult Note (Signed)
Consulting Physician: Hyman Hopes Michaelene Dutan  Referring Provider: Renne Crigler, PA-C  Chief Complaint: Abdominal pain  Reason for Consult: Appendicitis   Subjective   HPI: John [name] is an 62 y.o. male who is here for abdominal pain. He has had pain for about 48 hours prior to admission.  Pain located in the right lower quadrant and has been increasing in severity.  Past Medical History:  Diagnosis Date   AICD (automatic cardioverter/defibrillator) present    [employer]. [employer]   Anxiety    Ativan   Chronic combined systolic and diastolic CHF, NYHA class 3 (HCC) 10/2016   Nonischemic cardiomyopathy. EF 20-25%.   Chronic left hip pain    Depression    history of   Dysrhythmia    A-FIB   GI bleed 03/2016   Headache    Hypertensive heart disease with combined systolic and diastolic congestive heart failure (HCC) 10/2016   Nonischemic cardiomyopathy (HCC) 10/2016   Echo with EF 20-25%. Cardiac catheterization with no CAD. LVEDP was 41 mmHg, PCWP 36 mmHg   Osteoarthritis    right shoulder   Right hand fracture    Seasonal allergies    Sleep apnea    wears a CPAP   Wears glasses     Past Surgical History:  Procedure Laterality Date   CARDIAC CATHETERIZATION     CARDIOVERSION N/A 09/01/2019   Procedure: CARDIOVERSION;  Surgeon: Laurey Morale, MD;  Location: [facility] ENDOSCOPY;  Service: Cardiovascular;  Laterality: N/A;   CARDIOVERSION N/A 10/07/2022   Procedure: CARDIOVERSION;  Surgeon: Thomasene Ripple, DO;  Location: [facility] ENDOSCOPY;  Service: Cardiovascular;  Laterality: N/A;   ESOPHAGOGASTRODUODENOSCOPY (EGD) WITH PROPOFOL N/A 03/20/2016   Procedure: ESOPHAGOGASTRODUODENOSCOPY (EGD) WITH PROPOFOL;  Surgeon: Charlott Rakes, MD;  Location: [facility] ENDOSCOPY;  Service: Endoscopy;  Laterality: N/A;   ICD IMPLANT N/A 05/06/2018   Procedure: ICD IMPLANT;  Surgeon: Thurmon Fair, MD;  Location: [facility] INVASIVE CV LAB;  Service: Cardiovascular;  Laterality: N/A;   IRRIGATION AND DEBRIDEMENT  SHOULDER Left 11/11/2019   Procedure: LEFT SHOULDER IRRIGATION AND DEBRIDEMENT WITH POLY EXCHANGE;  Surgeon: Jones Broom, MD;  Location: [facility] ORS;  Service: Orthopedics;  Laterality: Left;   KYPHOPLASTY N/A 02/22/2021   Procedure: KYPHOPLASTY T12;  Surgeon: Venita Lick, MD;  Location: [facility] OR;  Service: Orthopedics;  Laterality: N/A;  90 mins   LEAD REVISION/REPAIR N/A 11/11/2022   Procedure: LEAD atrial lead insertion;  Surgeon: Lanier Prude, MD;  Location: [facility] INVASIVE CV LAB;  Service: Cardiovascular;  Laterality: N/A;   ORIF WRIST FRACTURE Left 02/04/2022   Procedure: OPEN REDUCTION INTERNAL FIXATION (ORIF) WRIST FRACTURE;  Surgeon: Bradly Bienenstock, MD;  Location: [facility] OR;  Service: Orthopedics;  Laterality: Left;  with IV sedation   PPM GENERATOR CHANGEOUT N/A 11/11/2022   Procedure: PPM GENERATOR CHANGEOUT;  Surgeon: Lanier Prude, MD;  Location: [facility] INVASIVE CV LAB;  Service: Cardiovascular;  Laterality: N/A;   RIGHT/LEFT HEART CATH AND CORONARY ANGIOGRAPHY N/A 10/21/2016   Procedure: Right/Left Heart Cath and Coronary Angiography;  Surgeon: Runell Gess, MD;  Location: [facility] INVASIVE CV LAB;  Service: Cardiovascular: Angiographically normal coronary arteries. PCWP 33-36 mmHg, LVEDP 41 mmHg. PA pressure 60/35, mean 46 mmHg.  Cardiac output/cardiac index-3.93 /1.76 Hiram Comber), 3.49/1.57 (thermodilution)   TEE WITHOUT CARDIOVERSION N/A 09/01/2019   Procedure: TRANSESOPHAGEAL ECHOCARDIOGRAM (TEE);  Surgeon: Laurey Morale, MD;  Location: [facility] ENDOSCOPY;  Service: Cardiovascular;  Laterality: N/A;   TOTAL HIP ARTHROPLASTY Left 03/27/2015   Procedure: LEFT TOTAL HIP ARTHROPLASTY ANTERIOR  APPROACH;  Surgeon: Samson Frederic, MD;  Location: [facility] OR;  Service: Orthopedics;  Laterality: Left;   TOTAL HIP ARTHROPLASTY Right 05/03/2021   Procedure: TOTAL HIP ARTHROPLASTY ANTERIOR APPROACH;  Surgeon: Samson Frederic, MD;  Location: [facility] ORS;  Service: Orthopedics;  Laterality: Right;   TOTAL SHOULDER  ARTHROPLASTY Right 11/24/2015   Procedure: RIGHT TOTAL SHOULDER ARTHROPLASTY;  Surgeon: Beverely Low, MD;  Location: [facility] OR;  Service: Orthopedics;  Laterality: Right;   TOTAL SHOULDER ARTHROPLASTY Left 10/19/2019   Procedure: REVERSE TOTAL SHOULDER ARTHROPLASTY;  Surgeon: Jones Broom, MD;  Location: [facility] ORS;  Service: Orthopedics;  Laterality: Left;   TRANSTHORACIC ECHOCARDIOGRAM  10/2016   EF 20-25%. Diffuse hypokinesis but akinesis of the entire inferoseptal wall and apical wall. Moderate biatrial enlargement. PA pressure estimated 64 mmHg.   WISDOM TOOTH EXTRACTION      Family History  Problem Relation Age of Onset   Dementia Mother        died at 42   Lung cancer Father        smoker   Congenital heart disease Sister        lived to 42    Healthy Brother    Healthy Sister    Prostate cancer Brother        possible cancer   Healthy Brother    Healthy Brother     Social:  reports that he has never smoked. He has never used smokeless tobacco. He reports current alcohol use of about 12.0 standard drinks of alcohol per week. He reports that he does not use drugs.  Allergies:  Allergies  Allergen Reactions   Chlorhexidine Itching   Cymbalta [Duloxetine Hcl] Nausea Only and Other (See Comments)    Night sweats     Hydrocodone Rash    Medications: Current Outpatient Medications  Medication Instructions   albuterol (VENTOLIN HFA) 108 (90 Base) MCG/ACT inhaler 2 puffs, Inhalation, Every 6 hours PRN   apixaban (ELIQUIS) 5 mg, Oral, 2 times daily   benzonatate (TESSALON) 200 mg, Oral, 2 times daily PRN   carvedilol (COREG) 25 MG tablet Take 0.5 tablets (12.5 mg total) by mouth every morning AND 1 tablet (25 mg total) every evening.   Cyanocobalamin (B-12) 1000 MCG CAPS 1 tablet, Oral, Daily   dapagliflozin propanediol (FARXIGA) 10 mg, Oral, Daily before breakfast   famotidine-calcium carbonate-magnesium hydroxide (PEPCID COMPLETE) 10-800-165 MG chewable tablet 1 tablet, Oral, 2  times daily PRN   ipratropium-albuterol (DUONEB) 0.5-2.5 (3) MG/3ML SOLN 3 mLs, Nebulization, Every 4 hours PRN   ketorolac (TORADOL) 10 mg, Oral, Every 6 hours PRN   meloxicam (MOBIC) 15 mg, Oral, Daily   methylPREDNISolone (MEDROL DOSEPAK) 4 MG TBPK tablet Take as directed   Multiple Vitamin (MULTIVITAMIN WITH MINERALS) TABS tablet 1 tablet, Every morning   predniSONE (DELTASONE) 20 MG tablet Take 2 pills for 3 days, 1 pill for 4 days   rosuvastatin (CRESTOR) 20 MG tablet TAKE 1 TABLET BY MOUTH EVERY DAY   sacubitril-valsartan (ENTRESTO) 97-103 MG 1 tablet, Oral, 2 times daily   Semaglutide-Weight Management 0.5 mg, Weekly   sertraline (ZOLOFT) 100 MG tablet TAKE 1 TABLET BY MOUTH EVERY DAY   sildenafil (VIAGRA) 100 mg, Oral, At bedtime PRN   spironolactone (ALDACTONE) 50 mg, Oral, Daily   torsemide (DEMADEX) 60 mg, Oral, Daily   traZODone (DESYREL) 50 mg, Oral, Daily at bedtime    ROS - all of the below systems have been reviewed with the patient and positives are indicated with bold text  General: chills, fever or night sweats Eyes: blurry vision or double vision ENT: epistaxis or sore throat Allergy/Immunology: itchy/watery eyes or nasal congestion Hematologic/Lymphatic: bleeding problems, blood clots or swollen lymph nodes Endocrine: temperature intolerance or unexpected weight changes Breast: new or changing breast lumps or nipple discharge Resp: cough, shortness of breath, or wheezing CV: chest pain or dyspnea on exertion GI: as per HPI GU: dysuria, trouble voiding, or hematuria MSK: joint pain or joint stiffness Neuro: TIA or stroke symptoms Derm: pruritus and skin lesion changes Psych: anxiety and depression  Objective   PE Blood pressure 115/81, pulse 93, temperature 99.4 F (37.4 C), temperature source Oral, resp. rate 20, height 5\' 11"  (1.803 m), weight 120.8 kg, SpO2 90%. Constitutional: NAD; conversant; no deformities Eyes: Moist conjunctiva; no lid lag;  anicteric; PERRL Neck: Trachea midline; no thyromegaly Lungs: Normal respiratory effort; no tactile fremitus CV: RRR; no palpable thrills; no pitting edema GI: Abd Tender RLQ; no palpable hepatosplenomegaly MSK: Normal range of motion of extremities; no clubbing/cyanosis Psychiatric: Appropriate affect; alert and oriented x3 Lymphatic: No palpable cervical or axillary lymphadenopathy  Results for orders placed or performed during the hospital encounter of 09/05/23 (from the past 24 hours)  Lipase, blood     Status: None   Collection Time: 09/05/23  2:28 PM  Result Value Ref Range   Lipase 14 11 - 51 U/L  Comprehensive metabolic panel     Status: Abnormal   Collection Time: 09/05/23  2:28 PM  Result Value Ref Range   Sodium 134 (L) 135 - 145 mmol/L   Potassium 3.9 3.5 - 5.1 mmol/L   Chloride 93 (L) 98 - 111 mmol/L   CO2 30 22 - 32 mmol/L   Glucose, Bld 130 (H) 70 - 99 mg/dL   BUN 18 8 - 23 mg/dL   Creatinine, Ser 9.60 (H) 0.61 - 1.24 mg/dL   Calcium 9.9 8.9 - 45.4 mg/dL   Total Protein 7.0 6.5 - 8.1 g/dL   Albumin 4.2 3.5 - 5.0 g/dL   AST 15 15 - 41 U/L   ALT 23 0 - 44 U/L   Alkaline Phosphatase 52 38 - 126 U/L   Total Bilirubin 1.1 <1.2 mg/dL   GFR, Estimated 59 (L) >60 mL/min   Anion gap 11 5 - 15  CBC     Status: Abnormal   Collection Time: 09/05/23  2:28 PM  Result Value Ref Range   WBC 26.3 (H) 4.0 - 10.5 K/uL   RBC 4.05 (L) 4.22 - 5.81 MIL/uL   Hemoglobin 13.2 13.0 - 17.0 g/dL   HCT 09.8 11.9 - 14.7 %   MCV 98.3 80.0 - 100.0 fL   MCH 32.6 26.0 - 34.0 pg   MCHC 33.2 30.0 - 36.0 g/dL   RDW 82.9 56.2 - 13.0 %   Platelets 243 150 - 400 K/uL   nRBC 0.0 0.0 - 0.2 %  Urinalysis, Routine w reflex microscopic -Urine, Unspecified Source     Status: Abnormal   Collection Time: 09/05/23  2:28 PM  Result Value Ref Range   Color, Urine YELLOW YELLOW   APPearance CLEAR CLEAR   Specific Gravity, Urine >1.046 (H) 1.005 - 1.030   pH 6.0 5.0 - 8.0   Glucose, UA 250 (A) NEGATIVE  mg/dL   Hgb urine dipstick NEGATIVE NEGATIVE   Bilirubin Urine NEGATIVE NEGATIVE   Ketones, ur NEGATIVE NEGATIVE mg/dL   Protein, ur 30 (A) NEGATIVE mg/dL   Nitrite NEGATIVE NEGATIVE   Leukocytes,Ua NEGATIVE NEGATIVE  RBC / HPF 0-5 0 - 5 RBC/hpf   WBC, UA 0-5 0 - 5 WBC/hpf   Bacteria, UA NONE SEEN NONE SEEN   Squamous Epithelial / HPF 0-5 0 - 5 /HPF   Mucus PRESENT    Hyaline Casts, UA PRESENT   Troponin I (High Sensitivity)     Status: Abnormal   Collection Time: 09/05/23  2:28 PM  Result Value Ref Range   Troponin I (High Sensitivity) 20 (H) <18 ng/L  Lactic acid, plasma     Status: None   Collection Time: 09/05/23  4:32 PM  Result Value Ref Range   Lactic Acid, Venous 1.7 0.5 - 1.9 mmol/L  Troponin I (High Sensitivity)     Status: Abnormal   Collection Time: 09/05/23  4:32 PM  Result Value Ref Range   Troponin I (High Sensitivity) 19 (H) <18 ng/L  CBC     Status: Abnormal   Collection Time: 09/06/23  4:23 AM  Result Value Ref Range   WBC 22.1 (H) 4.0 - 10.5 K/uL   RBC 3.71 (L) 4.22 - 5.81 MIL/uL   Hemoglobin 12.1 (L) 13.0 - 17.0 g/dL   HCT 84.1 (L) 66.0 - 63.0 %   MCV 101.1 (H) 80.0 - 100.0 fL   MCH 32.6 26.0 - 34.0 pg   MCHC 32.3 30.0 - 36.0 g/dL   RDW 16.0 10.9 - 32.3 %   Platelets 199 150 - 400 K/uL   nRBC 0.0 0.0 - 0.2 %  Basic metabolic panel     Status: Abnormal   Collection Time: 09/06/23  4:23 AM  Result Value Ref Range   Sodium 131 (L) 135 - 145 mmol/L   Potassium 3.7 3.5 - 5.1 mmol/L   Chloride 99 98 - 111 mmol/L   CO2 28 22 - 32 mmol/L   Glucose, Bld 126 (H) 70 - 99 mg/dL   BUN 16 8 - 23 mg/dL   Creatinine, Ser 5.57 (H) 0.61 - 1.24 mg/dL   Calcium 8.4 (L) 8.9 - 10.3 mg/dL   GFR, Estimated 55 (L) >60 mL/min   Anion gap 4 (L) 5 - 15  Magnesium     Status: None   Collection Time: 09/06/23  4:23 AM  Result Value Ref Range   Magnesium 2.3 1.7 - 2.4 mg/dL   *Note: Due to a large number of results and/or encounters for the requested time period, some  results have not been displayed. A complete set of results can be found in Results Review.     Imaging Orders         DG Chest 2 View         CT ABDOMEN PELVIS W CONTRAST     IMPRESSION: 1. Changes of acute appendicitis with distended appendix, appendicolith, and periappendiceal stranding. No abscess. 2. 3.5 cm diameter hypoenhancing lesion in the lateral segment left lobe of the liver. Although this appears stable from prior study, differential diagnosis would still included hepatocellular carcinoma or metastasis based on CT appearance. Recommend follow-up with elective MRI of the liver for better characterization. 3. Aortic atherosclerosis.    Assessment and Plan   John [name] is an 62 y.o. male with abdominal pain, found to have appendicitis on CT scan.  He has multiple medical issues including heart failure, and atrial fibrillation on Eliquis.  He last took his Eliquis the evening of 12/19.  There is an appendicolith on CT, so I feel he is not a good candidate for antibiotic treatment of appendicitis.  I  recommend laparoscopic appendectomy once the eliquis has cleared his system.  I described the procedure, its risks, benefits and alterantives and the patient granted consent to proceed.  There was an incidental finding of a liver lesion on CT.   This will require follow up.  If we are unable to see anything laparoscopically, I would plan on proceeding with MRI postoperatively.   Quentin Ore, MD  [facility] Use [email or web address] to contact on call provider  New Patient Billing: [id number] - High MDM

## 2023-09-06 NOTE — Anesthesia Procedure Notes (Signed)
Procedure Name: Intubation Date/Time: 09/06/2023 11:11 AM  Performed by: Gloris Ham, CRNAPre-anesthesia Checklist: Patient identified, Emergency Drugs available, Suction available and Patient being monitored Patient Re-evaluated:Patient Re-evaluated prior to induction Oxygen Delivery Method: Circle System Utilized Preoxygenation: Pre-oxygenation with 100% oxygen Induction Type: IV induction Ventilation: Mask ventilation without difficulty Tube type: Oral Number of attempts: 1 Airway Equipment and Method: Stylet and Oral airway Placement Confirmation: ETT inserted through vocal cords under direct vision, positive ETCO2 and breath sounds checked- equal and bilateral Tube secured with: Tape Dental Injury: Teeth and Oropharynx as per pre-operative assessment

## 2023-09-06 NOTE — Progress Notes (Signed)
Pre Procedure note for inpatients:   John [name] has been scheduled for Procedure(s): APPENDECTOMY LAPAROSCOPIC (N/A) today. The various methods of treatment have been discussed with the patient. After consideration of the risks, benefits and treatment options the patient has consented to the planned procedure.   The patient has been seen and labs reviewed. There are no changes in the patient's condition to prevent proceeding with the planned procedure today.  Recent labs:  Lab Results  Component Value Date   WBC 22.1 (H) 09/06/2023   HGB 12.1 (L) 09/06/2023   HCT 37.5 (L) 09/06/2023   PLT 199 09/06/2023   GLUCOSE 126 (H) 09/06/2023   CHOL 140 08/22/2023   TRIG 148.0 08/22/2023   HDL 42.90 08/22/2023   LDLDIRECT 85.0 03/06/2023   LDLCALC 67 08/22/2023   ALT 23 09/05/2023   AST 15 09/05/2023   NA 131 (L) 09/06/2023   K 3.7 09/06/2023   CL 99 09/06/2023   CREATININE 1.43 (H) 09/06/2023   BUN 16 09/06/2023   CO2 28 09/06/2023   TSH 1.030 08/22/2023   PSA 3.40 08/24/2020   INR 1.0 04/30/2021   HGBA1C 5.6 02/27/2023    Rodman Pickle, MD 09/06/2023 10:24 AM

## 2023-09-06 NOTE — Progress Notes (Addendum)
[facility] PROGRESS NOTE    Progress Note  John [name]  SAY:[id number] DOB: November 16, 1960 DOA: 09/05/2023 PCP: Shelva Majestic, MD     Brief Narrative:   John [name] is an 62 y.o. male past medical history significant for systolic heart failure with an EF of 30%, polymorphic VT status post AICD, paroxysmal atrial fibrillation on Eliquis asthma comes in for abdominal pain CT scan of the abdomen pelvis show appendicitis with appendicolith and periappendiceal stranding, 3.5 cm liver lesion in the left lobe which appears to be stable from a CT scan in October 2023   Assessment/Plan:   Acute appendicitis No evidence of abscess seen on CT. Patient currently n.p.o. on IV Zosyn. Last dose of Eliquis was on 1219 in the afternoon. General surgery was consulted who recommended surgical intervention once Eliquis has been cleared.  Paroxysmal atrial fibrillation: Rate controlled on Coreg. Continue Coreg hold Eliquis.  Acute kidney injury: Hold diuretic therapy and Entresto. Will start on gentle IV fluids he relates he is really thirsty, his urine appears super concentrated. Recheck basic metabolic panel in the morning.  Chronic combined systolic and diastolic heart failure/history of polymorphic VT's with an AICD: Last EF of 30%.  Continue Coreg. Hold Entresto and Lasix for surgical intervention and in the setting of acute kidney injury.Can restart postsurgical. Continue to hold Farxiga and Aldactone. Continue monitor strict I's and O's and daily weights.  Coronary artery disease: Denies any chest pain continue statins.  Essential hypertension: Continue Coreg hold Entresto Lasix and Aldactone.  OSA (obstructive sleep apnea) Continue CPAP at night.  Depression anxiety: Continue Zoloft.  Liver lesion: Unchanged from October 2023, can be followed up as an outpatient.  History of S/P ICD (internal cardiac defibrillator) procedure Likely hepatocellular  carcinoma. Can have MRI as an outpatient.     DVT prophylaxis: lovenox Family Communication:none Status is: Inpatient Remains inpatient appropriate because: Acute appendicitis    Code Status:     Code Status Orders  (From admission, onward)           Start     Ordered   09/05/23 2226  Full code  Continuous       Question:  By:  Answer:  Consent: discussion documented in EHR   09/05/23 2227           Code Status History     Date Active Date Inactive Code Status Order ID Comments User Context   11/11/2022 1546 11/11/2022 2335 Full Code [id number]  Lanier Prude, MD Inpatient   05/03/2021 1413 05/04/2021 2240 Full Code [id number]  Samson Frederic, MD Inpatient   02/22/2021 1019 02/23/2021 1647 Full Code [id number]  Rhodia Albright, PA-C Inpatient   02/16/2021 2256 02/20/2021 1709 Full Code [id number]  Anselm Jungling, DO Inpatient   11/11/2019 1100 11/13/2019 2337 Full Code [id number]  Jiles Harold, PA-C Inpatient   05/06/2018 1841 05/07/2018 1425 Full Code [id number]  Thurmon Fair, MD Inpatient   10/18/2016 0153 10/22/2016 1530 Full Code [id number]  Alberteen Sam, MD ED   03/19/2016 1310 03/21/2016 2107 Full Code [id number]  Michael Litter, MD Inpatient   03/27/2015 2200 03/29/2015 1610 Full Code [id number]  Samson Frederic, MD Inpatient         IV Access:   Peripheral IV   Procedures and diagnostic studies:   CT ABDOMEN PELVIS W CONTRAST Result Date: 09/05/2023 CLINICAL DATA:  Right lower quadrant abdominal pain. Generalized abdominal pain since yesterday. Diarrhea for 2 days. EXAM: CT ABDOMEN AND PELVIS WITH CONTRAST  TECHNIQUE: Multidetector CT imaging of the abdomen and pelvis was performed using the standard protocol following bolus administration of intravenous contrast. RADIATION DOSE REDUCTION: This exam was performed according to the departmental dose-optimization program which includes automated exposure control, adjustment of the mA and/or kV according to patient  size and/or use of iterative reconstruction technique. CONTRAST:  OMNIPAQUE IOHEXOL 300 MG/ML  SOLN COMPARISON:  06/26/2022 FINDINGS: Lower chest: Lung bases are clear. Hepatobiliary: Focal lesion in the lateral segment left lobe of liver measuring 3.5 cm diameter. The lesion appears unchanged since previous study. There is a thin enhancing capsule suggested. Although the lesion does appear stable since the previous study, this could still represent a metastasis or hepatocellular carcinoma and follow-up with elective MRI of the liver is recommended for best characterization. Gallbladder and bile ducts are normal. Pancreas: Unremarkable. No pancreatic ductal dilatation or surrounding inflammatory changes. Spleen: Normal in size without focal abnormality. Adrenals/Urinary Tract: Adrenal glands are unremarkable. Kidneys are normal, without renal calculi, focal lesion, or hydronephrosis. Bladder is decompressed. Stomach/Bowel: Stomach, small bowel, and colon are mostly decompressed. The appendix is mildly distended with diameter measuring 12 mm. There is a small appendicolith. There is stranding around the appendix. Changes are consistent with early acute appendicitis. No abscess. Vascular/Lymphatic: Aortic atherosclerosis. No enlarged abdominal or pelvic lymph nodes. Reproductive: Prostate is unremarkable. Other: No abdominal wall hernia or abnormality. No abdominopelvic ascites. Musculoskeletal: Degenerative changes in the spine. Kyphoplasty changes at T12. Bilateral hip arthroplasties. IMPRESSION: 1. Changes of acute appendicitis with distended appendix, appendicolith, and periappendiceal stranding. No abscess. 2. 3.5 cm diameter hypoenhancing lesion in the lateral segment left lobe of the liver. Although this appears stable from prior study, differential diagnosis would still included hepatocellular carcinoma or metastasis based on CT appearance. Recommend follow-up with elective MRI of the liver for better  characterization. 3. Aortic atherosclerosis. Electronically Signed   By: Burman Nieves M.D.   On: 09/05/2023 18:11   DG Chest 2 View Result Date: 09/05/2023 CLINICAL DATA:  Abdomen pain EXAM: CHEST - 2 VIEW COMPARISON:  11/11/2022 FINDINGS: Bilateral shoulder replacements. Left-sided pacing device as before. Streaky bibasilar atelectasis. Stable cardiomediastinal silhouette. Aortic atherosclerosis. No pneumothorax. IMPRESSION: Streaky bibasilar atelectasis. Electronically Signed   By: Jasmine Pang M.D.   On: 09/05/2023 16:55     Medical Consultants:   None.   Subjective:    John [name] relates his pain is controlled.  Really thirsty wants ice chips  Objective:    Vitals:   09/05/23 1710 09/05/23 2049 09/05/23 2311 09/06/23 0305  BP:  106/66 112/60 115/81  Pulse:  78 79 93  Resp:  20 14 20   Temp:  99.4 F (37.4 C) 99.4 F (37.4 C) 99.4 F (37.4 C)  TempSrc:  Oral Oral Oral  SpO2: 94% 90% 93% 90%  Weight:      Height:       SpO2: 90 % O2 Flow Rate (L/min): 2 L/min FiO2 (%): 28 %   Intake/Output Summary (Last 24 hours) at 09/06/2023 0650 Last data filed at 09/06/2023 0300 Gross per 24 hour  Intake 979.3 ml  Output 300 ml  Net 679.3 ml   Filed Weights   09/05/23 1417  Weight: 120.8 kg    Exam: General exam: In no acute distress. Respiratory system: Good air movement and clear to auscultation. Cardiovascular system: S1 & S2 heard, RRR. No JVD. Gastrointestinal system: Abdomen is distended, soft and tender.  Extremities: No pedal edema. Skin: No rashes, lesions or ulcers Psychiatry:  Judgement and insight appear normal. Mood & affect appropriate.    Data Reviewed:    Labs: Basic Metabolic Panel: Recent Labs  Lab 09/05/23 1428 09/06/23 0423  NA 134* 131*  K 3.9 3.7  CL 93* 99  CO2 30 28  GLUCOSE 130* 126*  BUN 18 16  CREATININE 1.35* 1.43*  CALCIUM 9.9 8.4*  MG  --  2.3   GFR Estimated Creatinine Clearance: 70.8 mL/min (A) (by C-G  formula based on SCr of 1.43 mg/dL (H)). Liver Function Tests: Recent Labs  Lab 09/05/23 1428  AST 15  ALT 23  ALKPHOS 52  BILITOT 1.1  PROT 7.0  ALBUMIN 4.2   Recent Labs  Lab 09/05/23 1428  LIPASE 14   No results for input(s): "AMMONIA" in the last 168 hours. Coagulation profile No results for input(s): "INR", "PROTIME" in the last 168 hours. COVID-19 Labs  No results for input(s): "DDIMER", "FERRITIN", "LDH", "CRP" in the last 72 hours.  Lab Results  Component Value Date   SARSCOV2NAA RESULT: NEGATIVE 05/01/2021   SARSCOV2NAA NEGATIVE 02/16/2021   SARSCOV2NAA NEGATIVE 11/10/2019   SARSCOV2NAA NEGATIVE 10/15/2019    CBC: Recent Labs  Lab 09/05/23 1428 09/06/23 0423  WBC 26.3* 22.1*  HGB 13.2 12.1*  HCT 39.8 37.5*  MCV 98.3 101.1*  PLT 243 199   Cardiac Enzymes: No results for input(s): "CKTOTAL", "CKMB", "CKMBINDEX", "TROPONINI" in the last 168 hours. BNP (last 3 results) Recent Labs    03/06/23 1538 08/22/23 1227  PROBNP 221.0* 286.0*   CBG: No results for input(s): "GLUCAP" in the last 168 hours. D-Dimer: No results for input(s): "DDIMER" in the last 72 hours. Hgb A1c: No results for input(s): "HGBA1C" in the last 72 hours. Lipid Profile: No results for input(s): "CHOL", "HDL", "LDLCALC", "TRIG", "CHOLHDL", "LDLDIRECT" in the last 72 hours. Thyroid function studies: No results for input(s): "TSH", "T4TOTAL", "T3FREE", "THYROIDAB" in the last 72 hours.  Invalid input(s): "FREET3" Anemia work up: No results for input(s): "VITAMINB12", "FOLATE", "FERRITIN", "TIBC", "IRON", "RETICCTPCT" in the last 72 hours. Sepsis Labs: Recent Labs  Lab 09/05/23 1428 09/05/23 1632 09/06/23 0423  WBC 26.3*  --  22.1*  LATICACIDVEN  --  1.7  --    Microbiology No results found for this or any previous visit (from the past 240 hours).   Medications:    carvedilol  12.5 mg Oral Daily   And   carvedilol  25 mg Oral QPM   rosuvastatin  20 mg Oral Daily    sacubitril-valsartan  1 tablet Oral BID   sertraline  100 mg Oral Daily   sodium chloride flush  3 mL Intravenous Q12H   torsemide  60 mg Oral Daily   Continuous Infusions:  piperacillin-tazobactam (ZOSYN)  IV 3.375 g (09/05/23 2247)      LOS: 1 day   Marinda Elk  [facility]  09/06/2023, 6:50 AM

## 2023-09-06 NOTE — Op Note (Signed)
Preoperative diagnosis: acute appendicitis with perforation  Postoperative diagnosis: Same   Procedure: laparoscopic appendectomy  Surgeon: Feliciana Rossetti, M.D.  Asst: none  Anesthesia: Gen.   Indications for procedure: John [name] is a 62 y.o. male with symptoms of vague pain  and nausea consistent with acute appendicitis. Confirmed by CT.  Description of procedure: The patient was brought into the operative suite, placed supine. Anesthesia was administered with endotracheal tube. The patient's left arm was tucked. All pressure points were offloaded by foam padding. The patient was prepped and draped in the usual sterile fashion.  A transverse incision was made in the epigastric region and a 5mm trocar was placed. Pneumoperitoneum was applied with high flow low pressure. 1 5 mm trocar was placed in the left lower quadrant. 1 12 mm trocar was placed in the periumbilical area. A transversus abdominal block was placed on the left and right sides. Next, the patient was placed in trendelenberg, rotated to the left. The omentum was retracted cephalad. The cecum and appendix were identified. The cecum was high in the RUQ near the liver edge. The appendix base came off lateral of the colon. The appendix appeared perforated and had a large amount of fatty tissue around it. The base of the appendix was dissected and a window through the mesoappendix was created with blunt dissection. A 45mm blue load stapler was used to cut the appendix at its base. Mesoappendix was taken with harmonic scalpel.  The appendix was placed in a specimen bag. The pelvis and RLQ and RUQ were inspected. No purulence was seen in the pelvis. The appendix was removed via the 12 mm trocar. A 19 Fr drain was placed through the LLQ trocar and tip put in the RUQ. 0 vicryl was used to close the fascial defect. Pneumoperitoneum was removed, all trocars were removed. All incisions were closed with 4-0 monocryl subcuticular stitch. The  patient woke from anesthesia and was brought to PACU in stable condition.  Findings: perforated appendicitis, appendix in the RUQ  Specimen: appendix  Blood loss: 30 ml  Local anesthesia: 30 ml Marcaine  Complications: none  Feliciana Rossetti, M.D. General, Bariatric, & Minimally Invasive Surgery [facility]

## 2023-09-06 NOTE — Transfer of Care (Signed)
Immediate Anesthesia Transfer of Care Note  Patient: John [name]  Procedure(s) Performed: APPENDECTOMY LAPAROSCOPIC  Patient Location: PACU  Anesthesia Type:MAC and General  Level of Consciousness: awake, alert , and oriented  Airway & Oxygen Therapy: Patient Spontanous Breathing and Patient connected to nasal cannula oxygen  Post-op Assessment: Report given to RN and Post -op Vital signs reviewed and stable  Post vital signs: Reviewed and stable  Last Vitals:  Vitals Value Taken Time  BP 96/47 09/06/23 1230  Temp    Pulse 83 09/06/23 1236  Resp 17 09/06/23 1236  SpO2 94 % 09/06/23 1236  Vitals shown include unfiled device data.  Last Pain:  Vitals:   09/06/23 1012  TempSrc: Oral  PainSc:          Complications: There were no known notable events for this encounter.

## 2023-09-07 DIAGNOSIS — I5042 Chronic combined systolic (congestive) and diastolic (congestive) heart failure: Secondary | ICD-10-CM | POA: Diagnosis not present

## 2023-09-07 DIAGNOSIS — E7800 Pure hypercholesterolemia, unspecified: Secondary | ICD-10-CM | POA: Diagnosis not present

## 2023-09-07 DIAGNOSIS — K353 Acute appendicitis with localized peritonitis, without perforation or gangrene: Secondary | ICD-10-CM | POA: Diagnosis not present

## 2023-09-07 DIAGNOSIS — I10 Essential (primary) hypertension: Secondary | ICD-10-CM | POA: Diagnosis not present

## 2023-09-07 LAB — BASIC METABOLIC PANEL
Anion gap: 13 (ref 5–15)
BUN: 29 mg/dL — ABNORMAL HIGH (ref 8–23)
CO2: 24 mmol/L (ref 22–32)
Calcium: 8.6 mg/dL — ABNORMAL LOW (ref 8.9–10.3)
Chloride: 96 mmol/L — ABNORMAL LOW (ref 98–111)
Creatinine, Ser: 2.92 mg/dL — ABNORMAL HIGH (ref 0.61–1.24)
GFR, Estimated: 24 mL/min — ABNORMAL LOW (ref >60)
Glucose, Bld: 135 mg/dL — ABNORMAL HIGH (ref 70–99)
Potassium: 4.7 mmol/L (ref 3.5–5.1)
Sodium: 133 mmol/L — ABNORMAL LOW (ref 135–145)

## 2023-09-07 LAB — CBC
HCT: 35.2 % — ABNORMAL LOW (ref 39.0–52.0)
Hemoglobin: 11.3 g/dL — ABNORMAL LOW (ref 13.0–17.0)
MCH: 33 pg (ref 26.0–34.0)
MCHC: 32.1 g/dL (ref 30.0–36.0)
MCV: 102.9 fL — ABNORMAL HIGH (ref 80.0–100.0)
Platelets: 169 10*3/uL (ref 150–400)
RBC: 3.42 MIL/uL — ABNORMAL LOW (ref 4.22–5.81)
RDW: 13.2 % (ref 11.5–15.5)
WBC: 20 10*3/uL — ABNORMAL HIGH (ref 4.0–10.5)
nRBC: 0 % (ref 0.0–0.2)

## 2023-09-07 MED ORDER — ALBUMIN HUMAN 25 % IV SOLN
25.0000 g | Freq: Four times a day (QID) | INTRAVENOUS | Status: AC
  Administered 2023-09-07 – 2023-09-08 (×4): 25 g via INTRAVENOUS
  Filled 2023-09-07 (×4): qty 100

## 2023-09-07 MED ORDER — CARVEDILOL 6.25 MG PO TABS
6.2500 mg | ORAL_TABLET | Freq: Two times a day (BID) | ORAL | Status: DC
  Administered 2023-09-07 – 2023-09-09 (×5): 6.25 mg via ORAL
  Filled 2023-09-07 (×5): qty 1

## 2023-09-07 MED ORDER — OXYCODONE HCL 5 MG PO TABS
5.0000 mg | ORAL_TABLET | ORAL | Status: DC | PRN
  Administered 2023-09-07 (×2): 5 mg via ORAL
  Filled 2023-09-07 (×2): qty 1

## 2023-09-07 MED ORDER — SODIUM CHLORIDE 0.9 % IV SOLN: INTRAVENOUS | Status: AC

## 2023-09-07 MED ORDER — SODIUM CHLORIDE 0.9 % IV BOLUS
500.0000 mL | Freq: Once | INTRAVENOUS | Status: AC
  Administered 2023-09-07: 500 mL via INTRAVENOUS

## 2023-09-07 NOTE — Plan of Care (Signed)
  Problem: Education: Goal: Knowledge of General Education information will improve Description: Including pain rating scale, medication(s)/side effects and non-pharmacologic comfort measures Outcome: Progressing   Problem: Clinical Measurements: Goal: Ability to maintain clinical measurements within normal limits will improve Outcome: Progressing Goal: Respiratory complications will improve Outcome: Progressing Goal: Cardiovascular complication will be avoided Outcome: Progressing   Problem: Activity: Goal: Risk for activity intolerance will decrease Outcome: Progressing   Problem: Nutrition: Goal: Adequate nutrition will be maintained Outcome: Progressing   Problem: Coping: Goal: Level of anxiety will decrease Outcome: Progressing   Problem: Elimination: Goal: Will not experience complications related to urinary retention Outcome: Progressing   Problem: Pain Management: Goal: General experience of comfort will improve Outcome: Progressing   Problem: Safety: Goal: Ability to remain free from injury will improve Outcome: Progressing   Problem: Skin Integrity: Goal: Risk for impaired skin integrity will decrease Outcome: Progressing

## 2023-09-07 NOTE — Progress Notes (Signed)
Progress Note: General Surgery Service   Chief Complaint/Subjective: Pain around incisions.  Tolerating liquids, asking for more to eat.  Objective: Vital signs in last 24 hours: Temp:  [97.3 F (36.3 C)-98.7 F (37.1 C)] 97.5 F (36.4 C) (12/22 0336) Pulse Rate:  [72-86] 75 (12/22 0336) Resp:  [13-21] 20 (12/22 0336) BP: (83-109)/(47-67) 97/62 (12/22 0336) SpO2:  [90 %-97 %] 90 % (12/22 0336) Weight:  [120.8 kg] 120.8 kg (12/21 1023) Last BM Date : 09/04/23  Intake/Output from previous day: 12/21 0701 - 12/22 0700 In: 2132.6 [P.O.:720; I.V.:1192.6; IV Piggyback:220] Out: 685 [Urine:450; Drains:125; Blood:10] Intake/Output this shift: No intake/output data recorded.  GI: Abd soft, incisions c/d/I w/ glue  Lab Results: CBC  Recent Labs    09/06/23 0423 09/07/23 0454  WBC 22.1* 20.0*  HGB 12.1* 11.3*  HCT 37.5* 35.2*  PLT 199 169   BMET Recent Labs    09/06/23 0423 09/07/23 0454  NA 131* 133*  K 3.7 4.7  CL 99 96*  CO2 28 24  GLUCOSE 126* 135*  BUN 16 29*  CREATININE 1.43* 2.92*  CALCIUM 8.4* 8.6*   PT/INR No results for input(s): "LABPROT", "INR" in the last 72 hours. ABG No results for input(s): "PHART", "HCO3" in the last 72 hours.  Invalid input(s): "PCO2", "PO2"  Anti-infectives: Anti-infectives (From admission, onward)    Start     Dose/Rate Route Frequency Ordered Stop   09/05/23 2300  piperacillin-tazobactam (ZOSYN) IVPB 3.375 g        3.375 g 12.5 mL/hr over 240 Minutes Intravenous Every 8 hours 09/05/23 2143     09/05/23 1615  piperacillin-tazobactam (ZOSYN) IVPB 3.375 g        3.375 g 12.5 mL/hr over 240 Minutes Intravenous  Once 09/05/23 1604 09/05/23 1820       Medications: Scheduled Meds:  carvedilol  6.25 mg Oral BID WC   rosuvastatin  20 mg Oral Daily   sertraline  100 mg Oral Daily   sodium chloride flush  3 mL Intravenous Q12H   Continuous Infusions:  piperacillin-tazobactam (ZOSYN)  IV 3.375 g (09/07/23 0514)   PRN  Meds:.acetaminophen **OR** acetaminophen, albuterol, diphenhydrAMINE, diphenhydrAMINE, hydrocortisone cream, HYDROmorphone (DILAUDID) injection, ondansetron, oxyCODONE  Assessment/Plan: John [name] is a 62 year old male with multiple medical issues including heart failure and atrial fibrillation on Eliquis, who presented on 09/05/23, to [facility] with appendicitis and underwent laparoscopic appendectomy on 09/06/23, with Dr. Sheliah Hatch.  Regular diet Okay to restart Eliquis tomorrow if hgb stabilizes AKI, appreciate medicine team's care Pain control Increase activity   John Ore, MD  [facility] Use [email or web address] to contact on call provider  Daily Billing: [phone] - post op

## 2023-09-07 NOTE — Progress Notes (Signed)
[facility] PROGRESS NOTE    Progress Note  John [name]  JXB:[id number] DOB: 1961-06-27 DOA: 09/05/2023 PCP: Shelva Majestic, MD     Brief Narrative:   Brae Godown is an 62 y.o. male past medical history significant for systolic heart failure with an EF of 30%, polymorphic VT status post AICD, paroxysmal atrial fibrillation on Eliquis asthma comes in for abdominal pain CT scan of the abdomen pelvis show appendicitis with appendicolith and periappendiceal stranding, 3.5 cm liver lesion in the left lobe which appears to be stable from a CT scan in October 2023   Assessment/Plan:   Acute appendicitis No evidence of abscess seen on CT. Patient is being allowed diet. Last dose of Eliquis was on 12.19 in the afternoon. General surgery was consulted status  post laparoscopic appendectomy. Continue IV Zosyn. Surgery to dictate when to restart Eliquis.  Paroxysmal atrial fibrillation: Rate controlled on Coreg. Continue Coreg hold Eliquis.  Acute kidney injury: Hold diuretic therapy and Entresto.  Blood pressure is borderline. Try to minimize IV Dilaudid use oral Oxy Probably due to mild episode of hypotension yesterday like around noon. Despite low rate of IV fluids creatinine is rising, he was given IV albumin. Will give him a 500 cc bolus, continue to hold diuretic and Entresto, will decrease Coreg dose. And recheck basic metabolic panel in the morning. He is positive about 2.2 L.  Encourage oral hydration.  Chronic combined systolic and diastolic heart failure/history of polymorphic VT's with an AICD: Last EF of 30%.  Continue Coreg at a lower dose.. Hold Entresto and Lasix for surgical intervention and in the setting of acute kidney injury. Continue to hold Farxiga and Aldactone. Continue monitor strict I's and O's and daily weights. He has positive about 2.2 L.  Coronary artery disease: Denies any chest pain continue statins.  Essential  hypertension: Continue Coreg, hold Entresto Lasix and Aldactone.  OSA (obstructive sleep apnea) Continue CPAP at night.  Depression anxiety: Continue Zoloft.  Liver lesion: Unchanged from October 2023, can be followed up as an outpatient.  History of S/P ICD (internal cardiac defibrillator) procedure Unlikely hepatocellular carcinoma. Can have MRI as an outpatient.     DVT prophylaxis: lovenox Family Communication:none Status is: Inpatient Remains inpatient appropriate because: Acute appendicitis    Code Status:     Code Status Orders  (From admission, onward)           Start     Ordered   09/05/23 2226  Full code  Continuous       Question:  By:  Answer:  Consent: discussion documented in EHR   09/05/23 2227           Code Status History     Date Active Date Inactive Code Status Order ID Comments User Context   11/11/2022 1546 11/11/2022 2335 Full Code [id number]  Lanier Prude, MD Inpatient   05/03/2021 1413 05/04/2021 2240 Full Code [id number]  Samson Frederic, MD Inpatient   02/22/2021 1019 02/23/2021 1647 Full Code [id number]  Rhodia Albright, PA-C Inpatient   02/16/2021 2256 02/20/2021 1709 Full Code [id number]  Anselm Jungling, DO Inpatient   11/11/2019 1100 11/13/2019 2337 Full Code [id number]  Jiles Harold, PA-C Inpatient   05/06/2018 1841 05/07/2018 1425 Full Code [id number]  Thurmon Fair, MD Inpatient   10/18/2016 0153 10/22/2016 1530 Full Code [id number]  Alberteen Sam, MD ED   03/19/2016 1310 03/21/2016 2107 Full Code [id number]  Michael Litter, MD Inpatient   03/27/2015 2200 03/29/2015 1610  Full Code [id number]  Samson Frederic, MD Inpatient         IV Access:   Peripheral IV   Procedures and diagnostic studies:   CT ABDOMEN PELVIS W CONTRAST Result Date: 09/05/2023 CLINICAL DATA:  Right lower quadrant abdominal pain. Generalized abdominal pain since yesterday. Diarrhea for 2 days. EXAM: CT ABDOMEN AND PELVIS WITH CONTRAST TECHNIQUE:  Multidetector CT imaging of the abdomen and pelvis was performed using the standard protocol following bolus administration of intravenous contrast. RADIATION DOSE REDUCTION: This exam was performed according to the departmental dose-optimization program which includes automated exposure control, adjustment of the mA and/or kV according to patient size and/or use of iterative reconstruction technique. CONTRAST:  OMNIPAQUE IOHEXOL 300 MG/ML  SOLN COMPARISON:  06/26/2022 FINDINGS: Lower chest: Lung bases are clear. Hepatobiliary: Focal lesion in the lateral segment left lobe of liver measuring 3.5 cm diameter. The lesion appears unchanged since previous study. There is a thin enhancing capsule suggested. Although the lesion does appear stable since the previous study, this could still represent a metastasis or hepatocellular carcinoma and follow-up with elective MRI of the liver is recommended for best characterization. Gallbladder and bile ducts are normal. Pancreas: Unremarkable. No pancreatic ductal dilatation or surrounding inflammatory changes. Spleen: Normal in size without focal abnormality. Adrenals/Urinary Tract: Adrenal glands are unremarkable. Kidneys are normal, without renal calculi, focal lesion, or hydronephrosis. Bladder is decompressed. Stomach/Bowel: Stomach, small bowel, and colon are mostly decompressed. The appendix is mildly distended with diameter measuring 12 mm. There is a small appendicolith. There is stranding around the appendix. Changes are consistent with early acute appendicitis. No abscess. Vascular/Lymphatic: Aortic atherosclerosis. No enlarged abdominal or pelvic lymph nodes. Reproductive: Prostate is unremarkable. Other: No abdominal wall hernia or abnormality. No abdominopelvic ascites. Musculoskeletal: Degenerative changes in the spine. Kyphoplasty changes at T12. Bilateral hip arthroplasties. IMPRESSION: 1. Changes of acute appendicitis with distended appendix,  appendicolith, and periappendiceal stranding. No abscess. 2. 3.5 cm diameter hypoenhancing lesion in the lateral segment left lobe of the liver. Although this appears stable from prior study, differential diagnosis would still included hepatocellular carcinoma or metastasis based on CT appearance. Recommend follow-up with elective MRI of the liver for better characterization. 3. Aortic atherosclerosis. Electronically Signed   By: Burman Nieves M.D.   On: 09/05/2023 18:11   DG Chest 2 View Result Date: 09/05/2023 CLINICAL DATA:  Abdomen pain EXAM: CHEST - 2 VIEW COMPARISON:  11/11/2022 FINDINGS: Bilateral shoulder replacements. Left-sided pacing device as before. Streaky bibasilar atelectasis. Stable cardiomediastinal silhouette. Aortic atherosclerosis. No pneumothorax. IMPRESSION: Streaky bibasilar atelectasis. Electronically Signed   By: Jasmine Pang M.D.   On: 09/05/2023 16:55     Medical Consultants:   None.   Subjective:    Freddick Majkut she was really thirsty asymptomatic.  Objective:    Vitals:   09/06/23 1456 09/06/23 1924 09/06/23 2348 09/07/23 0336  BP: 109/63 (!) 99/58 99/67 97/62   Pulse: 72 73 76 75  Resp: 14 16 16 20   Temp: 98.6 F (37 C) 98.7 F (37.1 C) 98.5 F (36.9 C) (!) 97.5 F (36.4 C)  TempSrc: Axillary Oral Oral Oral  SpO2:  95% 96% 90%  Weight:      Height:       SpO2: 90 % O2 Flow Rate (L/min): 2 L/min FiO2 (%): 28 %   Intake/Output Summary (Last 24 hours) at 09/07/2023 0634 Last data filed at 09/07/2023 0500 Gross per 24 hour  Intake 2132.61 ml  Output 535 ml  Net 1597.61  ml   Filed Weights   09/05/23 1417 09/06/23 1023  Weight: 120.8 kg 120.8 kg    Exam: General exam: In no acute distress. Respiratory system: Good air movement and clear to auscultation. Cardiovascular system: S1 & S2 heard, RRR. No JVD. Gastrointestinal system: Abdomen is nondistended, soft and tender.  Extremities: No pedal edema. Skin: No rashes, lesions or  ulcers Psychiatry: Judgement and insight appear normal. Mood & affect appropriate.  Data Reviewed:    Labs: Basic Metabolic Panel: Recent Labs  Lab 09/05/23 1428 09/06/23 0423 09/07/23 0454  NA 134* 131* 133*  K 3.9 3.7 4.7  CL 93* 99 96*  CO2 30 28 24   GLUCOSE 130* 126* 135*  BUN 18 16 29*  CREATININE 1.35* 1.43* 2.92*  CALCIUM 9.9 8.4* 8.6*  MG  --  2.3  --    GFR Estimated Creatinine Clearance: 34.7 mL/min (A) (by C-G formula based on SCr of 2.92 mg/dL (H)). Liver Function Tests: Recent Labs  Lab 09/05/23 1428  AST 15  ALT 23  ALKPHOS 52  BILITOT 1.1  PROT 7.0  ALBUMIN 4.2   Recent Labs  Lab 09/05/23 1428  LIPASE 14   No results for input(s): "AMMONIA" in the last 168 hours. Coagulation profile No results for input(s): "INR", "PROTIME" in the last 168 hours. COVID-19 Labs  No results for input(s): "DDIMER", "FERRITIN", "LDH", "CRP" in the last 72 hours.  Lab Results  Component Value Date   SARSCOV2NAA RESULT: NEGATIVE 05/01/2021   SARSCOV2NAA NEGATIVE 02/16/2021   SARSCOV2NAA NEGATIVE 11/10/2019   SARSCOV2NAA NEGATIVE 10/15/2019    CBC: Recent Labs  Lab 09/05/23 1428 09/06/23 0423 09/07/23 0454  WBC 26.3* 22.1* 20.0*  HGB 13.2 12.1* 11.3*  HCT 39.8 37.5* 35.2*  MCV 98.3 101.1* 102.9*  PLT 243 199 169   Cardiac Enzymes: No results for input(s): "CKTOTAL", "CKMB", "CKMBINDEX", "TROPONINI" in the last 168 hours. BNP (last 3 results) Recent Labs    03/06/23 1538 08/22/23 1227  PROBNP 221.0* 286.0*   CBG: No results for input(s): "GLUCAP" in the last 168 hours. D-Dimer: No results for input(s): "DDIMER" in the last 72 hours. Hgb A1c: No results for input(s): "HGBA1C" in the last 72 hours. Lipid Profile: No results for input(s): "CHOL", "HDL", "LDLCALC", "TRIG", "CHOLHDL", "LDLDIRECT" in the last 72 hours. Thyroid function studies: No results for input(s): "TSH", "T4TOTAL", "T3FREE", "THYROIDAB" in the last 72 hours.  Invalid  input(s): "FREET3" Anemia work up: No results for input(s): "VITAMINB12", "FOLATE", "FERRITIN", "TIBC", "IRON", "RETICCTPCT" in the last 72 hours. Sepsis Labs: Recent Labs  Lab 09/05/23 1428 09/05/23 1632 09/06/23 0423 09/07/23 0454  WBC 26.3*  --  22.1* 20.0*  LATICACIDVEN  --  1.7  --   --    Microbiology No results found for this or any previous visit (from the past 240 hours).   Medications:    carvedilol  12.5 mg Oral Daily   And   carvedilol  25 mg Oral QPM   rosuvastatin  20 mg Oral Daily   sertraline  100 mg Oral Daily   sodium chloride flush  3 mL Intravenous Q12H   Continuous Infusions:  piperacillin-tazobactam (ZOSYN)  IV 3.375 g (09/07/23 0514)      LOS: 2 days   Marinda Elk  [facility]  09/07/2023, 6:34 AM

## 2023-09-08 ENCOUNTER — Encounter (HOSPITAL_COMMUNITY): Payer: Self-pay | Admitting: General Surgery

## 2023-09-08 DIAGNOSIS — K353 Acute appendicitis with localized peritonitis, without perforation or gangrene: Secondary | ICD-10-CM | POA: Diagnosis not present

## 2023-09-08 LAB — BASIC METABOLIC PANEL
Anion gap: 13 (ref 5–15)
BUN: 38 mg/dL — ABNORMAL HIGH (ref 8–23)
CO2: 23 mmol/L (ref 22–32)
Calcium: 8.6 mg/dL — ABNORMAL LOW (ref 8.9–10.3)
Chloride: 95 mmol/L — ABNORMAL LOW (ref 98–111)
Creatinine, Ser: 2.69 mg/dL — ABNORMAL HIGH (ref 0.61–1.24)
GFR, Estimated: 26 mL/min — ABNORMAL LOW (ref >60)
Glucose, Bld: 100 mg/dL — ABNORMAL HIGH (ref 70–99)
Potassium: 3.5 mmol/L (ref 3.5–5.1)
Sodium: 131 mmol/L — ABNORMAL LOW (ref 135–145)

## 2023-09-08 LAB — CBC
HCT: 29.1 % — ABNORMAL LOW (ref 39.0–52.0)
Hemoglobin: 9.3 g/dL — ABNORMAL LOW (ref 13.0–17.0)
MCH: 32.5 pg (ref 26.0–34.0)
MCHC: 32 g/dL (ref 30.0–36.0)
MCV: 101.7 fL — ABNORMAL HIGH (ref 80.0–100.0)
Platelets: 203 10*3/uL (ref 150–400)
RBC: 2.86 MIL/uL — ABNORMAL LOW (ref 4.22–5.81)
RDW: 13 % (ref 11.5–15.5)
WBC: 14 10*3/uL — ABNORMAL HIGH (ref 4.0–10.5)
nRBC: 0 % (ref 0.0–0.2)

## 2023-09-08 LAB — GLUCOSE, CAPILLARY: Glucose-Capillary: 103 mg/dL — ABNORMAL HIGH (ref 70–99)

## 2023-09-08 MED ORDER — METHOCARBAMOL 500 MG PO TABS
500.0000 mg | ORAL_TABLET | Freq: Three times a day (TID) | ORAL | Status: DC | PRN
  Administered 2023-09-08: 500 mg via ORAL
  Filled 2023-09-08: qty 1

## 2023-09-08 MED ORDER — ACETAMINOPHEN 650 MG RE SUPP: 650.0000 mg | Freq: Four times a day (QID) | RECTAL | Status: DC | PRN

## 2023-09-08 MED ORDER — OXYCODONE HCL 5 MG PO TABS
5.0000 mg | ORAL_TABLET | ORAL | Status: DC | PRN
Start: 2023-09-08 — End: 2023-09-09
  Administered 2023-09-08: 5 mg via ORAL
  Administered 2023-09-09: 10 mg via ORAL
  Filled 2023-09-08 (×2): qty 2

## 2023-09-08 MED ORDER — ENOXAPARIN SODIUM 40 MG/0.4ML IJ SOSY
40.0000 mg | PREFILLED_SYRINGE | INTRAMUSCULAR | Status: DC
  Administered 2023-09-08: 40 mg via SUBCUTANEOUS
  Filled 2023-09-08: qty 0.4

## 2023-09-08 MED ORDER — HYDROMORPHONE HCL 1 MG/ML IJ SOLN
0.5000 mg | INTRAMUSCULAR | Status: DC | PRN
  Administered 2023-09-08: 1 mg via INTRAVENOUS
  Filled 2023-09-08: qty 1

## 2023-09-08 MED ORDER — GUAIFENESIN-DM 100-10 MG/5ML PO SYRP
5.0000 mL | ORAL_SOLUTION | ORAL | Status: DC | PRN
  Administered 2023-09-08: 5 mL via ORAL
  Filled 2023-09-08: qty 5

## 2023-09-08 MED ORDER — ACETAMINOPHEN 500 MG PO TABS
1000.0000 mg | ORAL_TABLET | Freq: Four times a day (QID) | ORAL | Status: DC | PRN
  Administered 2023-09-08: 1000 mg via ORAL
  Filled 2023-09-08: qty 2

## 2023-09-08 NOTE — Discharge Instructions (Signed)
[facility] ° °Please arrive at least 30 min before your appointment to complete your check in paperwork.  If you are unable to arrive 30 min prior to your appointment time we may have to cancel or reschedule you. °LAPAROSCOPIC SURGERY: POST OP INSTRUCTIONS °Always review your discharge instruction sheet given to you by the facility where your surgery was performed. °IF YOU HAVE DISABILITY OR FAMILY LEAVE FORMS, YOU MUST BRING THEM TO THE OFFICE FOR PROCESSING.   °DO NOT GIVE THEM TO YOUR DOCTOR. ° °PAIN CONTROL ° °First take acetaminophen (Tylenol) AND/or ibuprofen (Advil) to control your pain after surgery.  Follow directions on package.  Taking acetaminophen (Tylenol) and/or ibuprofen (Advil) regularly after surgery will help to control your pain and lower the amount of prescription pain medication you may need.  You should not take more than 4,000 mg (4 grams) of acetaminophen (Tylenol) in 24 hours.  You should not take ibuprofen (Advil), aleve, motrin, naprosyn or other NSAIDS if you have a history of stomach ulcers or chronic kidney disease.  °A prescription for pain medication may be given to you upon discharge.  Take your pain medication as prescribed, if you still have uncontrolled pain after taking acetaminophen (Tylenol) or ibuprofen (Advil). °Use ice packs to help control pain. °If you need a refill on your pain medication, please contact your pharmacy.  They will contact our office to request authorization. Prescriptions will not be filled after 5pm or on week-ends. ° °HOME MEDICATIONS °Take your usually prescribed medications unless otherwise directed. ° °DIET °You should follow a light diet the first few days after arrival home.  Be sure to include lots of fluids daily. Avoid fatty, fried foods.  ° °CONSTIPATION °It is common to experience some constipation after surgery and if you are taking pain medication.  Increasing fluid intake and taking a stool softener (such as Colace)  will usually help or prevent this problem from occurring.  A mild laxative (Milk of Magnesia or Miralax) should be taken according to package instructions if there are no bowel movements after 48 hours. ° °WOUND/INCISION CARE °Most patients will experience some swelling and bruising in the area of the incisions.  Ice packs will help.  Swelling and bruising can take several days to resolve.  °Unless discharge instructions indicate otherwise, follow guidelines below  °STERI-STRIPS - you may remove your outer bandages 48 hours after surgery, and you may shower at that time.  You have steri-strips (small skin tapes) in place directly over the incision.  These strips should be left on the skin for 7-10 days.   °DERMABOND/SKIN GLUE - you may shower in 24 hours.  The glue will flake off over the next 2-3 weeks. °Any sutures or staples will be removed at the office during your follow-up visit. ° °ACTIVITIES °You may resume regular (light) daily activities beginning the next day--such as daily self-care, walking, climbing stairs--gradually increasing activities as tolerated.  You may have sexual intercourse when it is comfortable.  Refrain from any heavy lifting or straining until approved by your doctor. °You may drive when you are no longer taking prescription pain medication, you can comfortably wear a seatbelt, and you can safely maneuver your car and apply brakes. ° °FOLLOW-UP °You should see your doctor in the office for a follow-up appointment approximately 2-3 weeks after your surgery.  You should have been given your post-op/follow-up appointment when your surgery was scheduled.  If you did not receive a post-op/follow-up appointment, make sure   that you call for this appointment within a day or two after you arrive home to insure a convenient appointment time. ° ° °WHEN TO CALL YOUR DOCTOR: °Fever over 101.0 °Inability to urinate °Continued bleeding from incision. °Increased pain, redness, or drainage from the  incision. °Increasing abdominal pain ° °The clinic staff is available to answer your questions during regular business hours.  Please don’t hesitate to call and ask to speak to one of the nurses for clinical concerns.  If you have a medical emergency, go to the nearest emergency room or call 911.  A surgeon from [facility] is always on call at the hospital. °[address], [address], [place], [place]  [address] ? [address] [address], [place], [place]   [address] °[phone] ? [phone] ? FAX [phone] ° ° ° ° °Managing Your Pain After Surgery Without Opioids ° ° ° °Thank you for participating in our program to help patients manage their pain after surgery without opioids. This is part of our effort to provide you with the best care possible, without exposing you or your family to the risk that opioids pose. ° °What pain can I expect after surgery? °You can expect to have some pain after surgery. This is normal. The pain is typically worse the day after surgery, and quickly begins to get better. °Many studies have found that many patients are able to manage their pain after surgery with Over-the-Counter (OTC) medications such as Tylenol and Motrin. If you have a condition that does not allow you to take Tylenol or Motrin, notify your surgical team. ° °How will I manage my pain? °The best strategy for controlling your pain after surgery is around the clock pain control with Tylenol (acetaminophen) and Motrin (ibuprofen or Advil). Alternating these medications with each other allows you to maximize your pain control. In addition to Tylenol and Motrin, you can use heating pads or ice packs on your incisions to help reduce your pain. ° °How will I alternate your regular strength over-the-counter pain medication? °You will take a dose of pain medication every three hours. °Start by taking 650 mg of Tylenol (2 pills of 325 mg) °3 hours later take 600 mg of Motrin (3 pills of 200 mg) °3 hours after  taking the Motrin take 650 mg of Tylenol °3 hours after that take 600 mg of Motrin. ° ° °- 1 - ° °See example - if your first dose of Tylenol is at 12:00 PM ° ° °12:00 PM Tylenol 650 mg (2 pills of 325 mg)  °3:00 PM Motrin 600 mg (3 pills of 200 mg)  °6:00 PM Tylenol 650 mg (2 pills of 325 mg)  °9:00 PM Motrin 600 mg (3 pills of 200 mg)  °Continue alternating every 3 hours  ° °We recommend that you follow this schedule around-the-clock for at least 3 days after surgery, or until you feel that it is no longer needed. Use the table on the last page of this handout to keep track of the medications you are taking. °Important: °Do not take more than 3000mg of Tylenol or 3200mg of Motrin in a 24-hour period. °Do not take ibuprofen/Motrin if you have a history of bleeding stomach ulcers, severe kidney disease, &/or actively taking a blood thinner ° °What if I still have pain? °If you have pain that is not controlled with the over-the-counter pain medications (Tylenol and Motrin or Advil) you might have what we call “breakthrough” pain. You will receive a prescription   for a small amount of an opioid pain medication such as Oxycodone, Tramadol, or Tylenol with Codeine. Use these opioid pills in the first 24 hours after surgery if you have breakthrough pain. Do not take more than 1 pill every 4-6 hours. ° °If you still have uncontrolled pain after using all opioid pills, don't hesitate to call our staff using the number provided. We will help make sure you are managing your pain in the best way possible, and if necessary, we can provide a prescription for additional pain medication. ° ° °Day 1   ° °Time  °Name of Medication Number of pills taken  °Amount of Acetaminophen  °Pain Level  ° °Comments  °AM PM       °AM PM       °AM PM       °AM PM       °AM PM       °AM PM       °AM PM       °AM PM       °Total Daily amount of Acetaminophen °Do not take more than  3,000 mg per day    ° ° °Day 2   ° °Time  °Name of Medication  Number of pills °taken  °Amount of Acetaminophen  °Pain Level  ° °Comments  °AM PM       °AM PM       °AM PM       °AM PM       °AM PM       °AM PM       °AM PM       °AM PM       °Total Daily amount of Acetaminophen °Do not take more than  3,000 mg per day    ° ° °Day 3   ° °Time  °Name of Medication Number of pills taken  °Amount of Acetaminophen  °Pain Level  ° °Comments  °AM PM       °AM PM       °AM PM       °AM PM       ° ° ° °AM PM       °AM PM       °AM PM       °AM PM       °Total Daily amount of Acetaminophen °Do not take more than  3,000 mg per day    ° ° °Day 4   ° °Time  °Name of Medication Number of pills taken  °Amount of Acetaminophen  °Pain Level  ° °Comments  °AM PM       °AM PM       °AM PM       °AM PM       °AM PM       °AM PM       °AM PM       °AM PM       °Total Daily amount of Acetaminophen °Do not take more than  3,000 mg per day    ° ° °Day 5   ° °Time  °Name of Medication Number °of pills taken  °Amount of Acetaminophen  °Pain Level  ° °Comments  °AM PM       °AM PM       °AM PM       °AM PM       °AM PM       °AM   PM       °AM PM       °AM PM       °Total Daily amount of Acetaminophen °Do not take more than  3,000 mg per day    ° ° ° °Day 6   ° °Time  °Name of Medication Number of pills °taken  °Amount of Acetaminophen  °Pain Level  °Comments  °AM PM       °AM PM       °AM PM       °AM PM       °AM PM       °AM PM       °AM PM       °AM PM       °Total Daily amount of Acetaminophen °Do not take more than  3,000 mg per day    ° ° °Day 7   ° °Time  °Name of Medication Number of pills taken  °Amount of Acetaminophen  °Pain Level  ° °Comments  °AM PM       °AM PM       °AM PM       °AM PM       °AM PM       °AM PM       °AM PM       °AM PM       °Total Daily amount of Acetaminophen °Do not take more than  3,000 mg per day    ° ° ° ° °For additional information about how and where to safely dispose of unused opioid °medications - https://www.morepowerfulnc.org ° °Disclaimer: This document  contains information and/or instructional materials adapted from [facility] for the typical patient with your condition. It does not replace medical advice from your health care provider because your experience may differ from that of the °typical patient. Talk to your health care provider if you have any questions about this °document, your condition or your treatment plan. °Adapted from [facility] ° °

## 2023-09-08 NOTE — Progress Notes (Addendum)
0930 patient asking to have "IV drugs" when asked if patient is in pain he states he is in 9/10 pain and wants only the IV drugs then asked when he can go home IV and oral pain medication given to patient. Patient states that he's from "[facility] and wants only the good drugs". Patient education on pain and constipation as well as mobility patient states he will be more active at home and take oral pain meds there while hews here he only wants "IV Drugs" 1700 no bowel movement as of this time patient reports passing gas. Patient wanted bed alarm off and side rail down education on falls patient [id number] understands fall risk, patient not eating meals only taking in fluids.

## 2023-09-08 NOTE — Progress Notes (Addendum)
Progress Note: General Surgery Service   Chief Complaint/Subjective: Pain controlled with IV pain meds. Tolerating solid diet. Has had a BM  Objective: Vital signs in last 24 hours: Temp:  [98 F (36.7 C)-98.9 F (37.2 C)] 98.3 F (36.8 C) (12/23 0750) Pulse Rate:  [72-88] 82 (12/23 0750) Resp:  [13-24] 24 (12/23 0750) BP: (90-116)/(45-62) 115/47 (12/23 0750) SpO2:  [88 %-95 %] 90 % (12/23 0750) Weight:  [122 kg] 122 kg (12/23 0249) Last BM Date : 09/04/23  Intake/Output from previous day: 12/22 0701 - 12/23 0700 In: 1713.1 [P.O.:240; I.V.:1030.6; IV Piggyback:442.5] Out: 1155 [Urine:1100; Drains:55] Intake/Output this shift: Total I/O In: -  Out: 10 [Drains:10]  GI: Abd soft, incisions c/d/I w/ glue Drain SS  Lab Results: CBC  Recent Labs    09/06/23 0423 09/07/23 0454  WBC 22.1* 20.0*  HGB 12.1* 11.3*  HCT 37.5* 35.2*  PLT 199 169   BMET Recent Labs    09/07/23 0454 09/08/23 0417  NA 133* 131*  K 4.7 3.5  CL 96* 95*  CO2 24 23  GLUCOSE 135* 100*  BUN 29* 38*  CREATININE 2.92* 2.69*  CALCIUM 8.6* 8.6*   PT/INR No results for input(s): "LABPROT", "INR" in the last 72 hours. ABG No results for input(s): "PHART", "HCO3" in the last 72 hours.  Invalid input(s): "PCO2", "PO2"  Anti-infectives: Anti-infectives (From admission, onward)    Start     Dose/Rate Route Frequency Ordered Stop   09/05/23 2300  piperacillin-tazobactam (ZOSYN) IVPB 3.375 g        3.375 g 12.5 mL/hr over 240 Minutes Intravenous Every 8 hours 09/05/23 2143     09/05/23 1615  piperacillin-tazobactam (ZOSYN) IVPB 3.375 g        3.375 g 12.5 mL/hr over 240 Minutes Intravenous  Once 09/05/23 1604 09/05/23 1820       Medications: Scheduled Meds:  carvedilol  6.25 mg Oral BID WC   rosuvastatin  20 mg Oral Daily   sertraline  100 mg Oral Daily   sodium chloride flush  3 mL Intravenous Q12H   Continuous Infusions:  piperacillin-tazobactam (ZOSYN)  IV 12.5 mL/hr at 09/08/23  0600   PRN Meds:.acetaminophen **OR** acetaminophen, albuterol, diphenhydrAMINE, diphenhydrAMINE, hydrocortisone cream, HYDROmorphone (DILAUDID) injection, ondansetron, oxyCODONE  Assessment/Plan: John [name] is a 62 year old male with multiple medical issues including heart failure and atrial fibrillation on Eliquis, who presented on 09/05/23, to [facility] with appendicitis and underwent  POD2 s/p  laparoscopic appendectomy on 09/06/23, with Dr. Sheliah Hatch.  Tolerating regular diet, +BM CBC today with WBC improved 14 (20), hgb down to 9.3 from 11.3 Cr 2.69 from 2.92 Drain SS - continue on dc Cont IV abx - transition PO on dc to complete 5 days course  Given ongoing AKI and hgb drift recc repeat labs am and hold eliquis today. Will start lovenox  FEN: reg ID: [email or web address] VTE: lovenox, hold eliquis  Per primary A fib on eliquis HF with AICD CAD HTN OSA AKI   Eric Form, [facility] Surgery 09/08/2023, 9:57 AM Please see Amion for pager number during day hours 7:00am-4:30pm   Daily Billing: [id number] - post op

## 2023-09-08 NOTE — Anesthesia Postprocedure Evaluation (Signed)
Anesthesia Post Note  Patient: John [name]  Procedure(s) Performed: APPENDECTOMY LAPAROSCOPIC     Patient location during evaluation: PACU Anesthesia Type: General Level of consciousness: awake and alert Pain management: pain level controlled Vital Signs Assessment: post-procedure vital signs reviewed and stable Respiratory status: spontaneous breathing, nonlabored ventilation, respiratory function stable and patient connected to nasal cannula oxygen Cardiovascular status: blood pressure returned to baseline and stable Postop Assessment: no apparent nausea or vomiting Anesthetic complications: no   There were no known notable events for this encounter.  Last Vitals:  Vitals:   09/08/23 0249 09/08/23 0750  BP: 97/61 (!) 115/47  Pulse: 84 82  Resp: 18 (!) 24  Temp: 36.8 C 36.8 C  SpO2: 93% 90%    Last Pain:  Vitals:   09/08/23 0750  TempSrc: Oral  PainSc:                  [name]

## 2023-09-08 NOTE — Progress Notes (Signed)
PROGRESS NOTE    John [name]  ZOX:[id number] DOB: November 25, 1960 DOA: 09/05/2023 PCP: Shelva Majestic, MD  Brief Narrative: John [name] is an 62 y.o. male past medical history significant for systolic heart failure with an EF of 30%, polymorphic VT status post AICD, paroxysmal atrial fibrillation on Eliquis asthma comes in for abdominal pain CT scan of the abdomen pelvis show appendicitis with appendicolith and periappendiceal stranding, 3.5 cm liver lesion in the left lobe which appears to be stable from a CT scan in October 2023  Assessment & Plan:   Principal Problem:   Acute appendicitis Active Problems:   Essential hypertension   Hyperlipidemia   OSA (obstructive sleep apnea)   Paroxysmal atrial fibrillation (HCC)   Chronic combined systolic and diastolic CHF, NYHA class 3 (HCC)   S/P ICD (internal cardiac defibrillator) procedure   GAD (generalized anxiety disorder)   CAD (coronary artery disease)   Liver lesion, left lobe   Acute appendicitis No evidence of abscess seen on CT. Patient tolerated a soft diet Had BM Last dose of Eliquis was on 12.19 in the afternoon. General surgery was consulted status  post laparoscopic appendectomy. Continue IV Zosyn. Surgery to dictate when to restart Eliquis.   Paroxysmal atrial fibrillation: Rate controlled on Coreg. Continue Coreg hold Eliquis.   Acute kidney injury: Hold diuretic therapy and Entresto.  Blood pressure is borderline. Try to minimize IV Dilaudid use oral Oxy Looks like he has some underlying ckd Cr improving   Chronic combined systolic and diastolic heart failure/history of polymorphic VT's with an AICD: Last EF of 30%.  Continue Coreg at a lower dose.. Hold Entresto and Lasix for surgical intervention and in the setting of acute kidney injury. Continue to hold Farxiga and Aldactone. Continue monitor strict I's and O's and daily weights.   Coronary artery disease: Denies any chest pain continue  statins.   Essential hypertension: Continue Coreg, hold Entresto Lasix and Aldactone.   OSA (obstructive sleep apnea) Continue CPAP at night.   Depression anxiety: Continue Zoloft.   Liver lesion: Unchanged from October 2023, can be followed up as an outpatient.   History of S/P ICD (internal cardiac defibrillator) procedure  CT 3.5 CM DIAMETER liver lesion Unlikely hepatocellular carcinoma. Can have MRI as an outpatient.  ABLA with hb drop 9 from 11 Holding eliquis Recheck labs in am  Estimated body mass index is 37.53 kg/m as calculated from the following:   Height as of this encounter: 5' 10.98" (1.803 m).   Weight as of this encounter: 122 kg.  DVT prophylaxis: eliquis on hold Code Status: full Family Communication: none Disposition Plan:  Status is: Inpatient Remains inpatient appropriate because: acute illness   Consultants:  Surgery   Procedures:lap appy Antimicrobials:zosyn  Subjective: Had bm ate soft diet  Objective: Vitals:   09/07/23 2200 09/07/23 2315 09/08/23 0249 09/08/23 0750  BP: (!) 92/45 (!) 116/52 97/61 (!) 115/47  Pulse: 72 76 84 82  Resp: 16 17 18  (!) 24  Temp:  98.6 F (37 C) 98.3 F (36.8 C) 98.3 F (36.8 C)  TempSrc:  Oral Oral Oral  SpO2: (!) 88% 95% 93% 90%  Weight:   122 kg   Height:        Intake/Output Summary (Last 24 hours) at 09/08/2023 1139 Last data filed at 09/08/2023 1000 Gross per 24 hour  Intake 1857.79 ml  Output 1125 ml  Net 732.79 ml   Filed Weights   09/05/23 1417 09/06/23 1023 09/08/23 0249  Weight:  120.8 kg 120.8 kg 122 kg    Examination:  General exam: Appears calm and comfortable  Respiratory system: Clear to auscultation. Respiratory effort normal. Cardiovascular system: S1 & S2 heard, RRR. No JVD, murmurs, rubs, gallops or clicks. No pedal edema. Gastrointestinal system: Abdomen is distended, soft and tender.  Central nervous system: Alert and oriented. No focal neurological  deficits. Extremities: trace edema  Data Reviewed: I have personally reviewed following labs and imaging studies  CBC: Recent Labs  Lab 09/05/23 1428 09/06/23 0423 09/07/23 0454 09/08/23 1016  WBC 26.3* 22.1* 20.0* 14.0*  HGB 13.2 12.1* 11.3* 9.3*  HCT 39.8 37.5* 35.2* 29.1*  MCV 98.3 101.1* 102.9* 101.7*  PLT 243 199 169 203   Basic Metabolic Panel: Recent Labs  Lab 09/05/23 1428 09/06/23 0423 09/07/23 0454 09/08/23 0417  NA 134* 131* 133* 131*  K 3.9 3.7 4.7 3.5  CL 93* 99 96* 95*  CO2 30 28 24 23   GLUCOSE 130* 126* 135* 100*  BUN 18 16 29* 38*  CREATININE 1.35* 1.43* 2.92* 2.69*  CALCIUM 9.9 8.4* 8.6* 8.6*  MG  --  2.3  --   --    GFR: Estimated Creatinine Clearance: 37.9 mL/min (A) (by C-G formula based on SCr of 2.69 mg/dL (H)). Liver Function Tests: Recent Labs  Lab 09/05/23 1428  AST 15  ALT 23  ALKPHOS 52  BILITOT 1.1  PROT 7.0  ALBUMIN 4.2   Recent Labs  Lab 09/05/23 1428  LIPASE 14   No results for input(s): "AMMONIA" in the last 168 hours. Coagulation Profile: No results for input(s): "INR", "PROTIME" in the last 168 hours. Cardiac Enzymes: No results for input(s): "CKTOTAL", "CKMB", "CKMBINDEX", "TROPONINI" in the last 168 hours. BNP (last 3 results) Recent Labs    03/06/23 1538 08/22/23 1227  PROBNP 221.0* 286.0*   HbA1C: No results for input(s): "HGBA1C" in the last 72 hours. CBG: Recent Labs  Lab 09/08/23 0748  GLUCAP 103*   Lipid Profile: No results for input(s): "CHOL", "HDL", "LDLCALC", "TRIG", "CHOLHDL", "LDLDIRECT" in the last 72 hours. Thyroid Function Tests: No results for input(s): "TSH", "T4TOTAL", "FREET4", "T3FREE", "THYROIDAB" in the last 72 hours. Anemia Panel: No results for input(s): "VITAMINB12", "FOLATE", "FERRITIN", "TIBC", "IRON", "RETICCTPCT" in the last 72 hours. Sepsis Labs: Recent Labs  Lab 09/05/23 1632  LATICACIDVEN 1.7    No results found for this or any previous visit (from the past 240  hours).   Radiology Studies: No results found.  Scheduled Meds:  carvedilol  6.25 mg Oral BID WC   enoxaparin (LOVENOX) injection  40 mg Subcutaneous Q24H   rosuvastatin  20 mg Oral Daily   sertraline  100 mg Oral Daily   sodium chloride flush  3 mL Intravenous Q12H   Continuous Infusions:  piperacillin-tazobactam (ZOSYN)  IV 3.375 g (09/08/23 1103)    LOS: 3 days   Time spent: 36 min Alwyn Ren, MD 09/08/2023, 11:39 AM

## 2023-09-08 NOTE — Care Management Important Message (Signed)
Important Message  Patient Details  Name: Renwick Piere MRN: [id number] Date of Birth: 08/17/1961   Important Message Given:  Yes - Medicare IM     Sherilyn Banker 09/08/2023, 3:12 PM

## 2023-09-08 NOTE — Plan of Care (Signed)

## 2023-09-09 ENCOUNTER — Other Ambulatory Visit (HOSPITAL_COMMUNITY): Payer: Self-pay

## 2023-09-09 DIAGNOSIS — K353 Acute appendicitis with localized peritonitis, without perforation or gangrene: Secondary | ICD-10-CM | POA: Diagnosis not present

## 2023-09-09 LAB — CBC
HCT: 31.9 % — ABNORMAL LOW (ref 39.0–52.0)
Hemoglobin: 10.2 g/dL — ABNORMAL LOW (ref 13.0–17.0)
MCH: 32.5 pg (ref 26.0–34.0)
MCHC: 32 g/dL (ref 30.0–36.0)
MCV: 101.6 fL — ABNORMAL HIGH (ref 80.0–100.0)
Platelets: 227 10*3/uL (ref 150–400)
RBC: 3.14 MIL/uL — ABNORMAL LOW (ref 4.22–5.81)
RDW: 13.2 % (ref 11.5–15.5)
WBC: 13.2 10*3/uL — ABNORMAL HIGH (ref 4.0–10.5)
nRBC: 0 % (ref 0.0–0.2)

## 2023-09-09 LAB — SURGICAL PATHOLOGY

## 2023-09-09 LAB — BASIC METABOLIC PANEL
Anion gap: 12 (ref 5–15)
BUN: 31 mg/dL — ABNORMAL HIGH (ref 8–23)
CO2: 26 mmol/L (ref 22–32)
Calcium: 9.2 mg/dL (ref 8.9–10.3)
Chloride: 94 mmol/L — ABNORMAL LOW (ref 98–111)
Creatinine, Ser: 1.85 mg/dL — ABNORMAL HIGH (ref 0.61–1.24)
GFR, Estimated: 41 mL/min — ABNORMAL LOW (ref >60)
Glucose, Bld: 112 mg/dL — ABNORMAL HIGH (ref 70–99)
Potassium: 3.3 mmol/L — ABNORMAL LOW (ref 3.5–5.1)
Sodium: 132 mmol/L — ABNORMAL LOW (ref 135–145)

## 2023-09-09 LAB — GLUCOSE, CAPILLARY: Glucose-Capillary: 115 mg/dL — ABNORMAL HIGH (ref 70–99)

## 2023-09-09 MED ORDER — ENTRESTO 97-103 MG PO TABS
1.0000 | ORAL_TABLET | Freq: Two times a day (BID) | ORAL | 3 refills | Status: DC
  Filled 2023-09-09: qty 60, 30d supply, fill #0

## 2023-09-09 MED ORDER — POTASSIUM CHLORIDE CRYS ER 20 MEQ PO TBCR
40.0000 meq | EXTENDED_RELEASE_TABLET | Freq: Once | ORAL | Status: AC
  Administered 2023-09-09: 40 meq via ORAL
  Filled 2023-09-09: qty 2

## 2023-09-09 MED ORDER — OXYCODONE HCL 5 MG PO TABS
5.0000 mg | ORAL_TABLET | ORAL | 0 refills | Status: AC | PRN
  Filled 2023-09-09: qty 60, 5d supply, fill #0

## 2023-09-09 MED ORDER — SPIRONOLACTONE 50 MG PO TABS
50.0000 mg | ORAL_TABLET | Freq: Every day | ORAL | 3 refills | Status: DC
  Filled 2023-09-09: qty 30, 30d supply, fill #0

## 2023-09-09 MED ORDER — CARVEDILOL 6.25 MG PO TABS
6.2500 mg | ORAL_TABLET | Freq: Two times a day (BID) | ORAL | 0 refills | Status: DC
  Filled 2023-09-09: qty 60, 30d supply, fill #0

## 2023-09-09 MED ORDER — TORSEMIDE 20 MG PO TABS
40.0000 mg | ORAL_TABLET | Freq: Every day | ORAL | 0 refills | Status: DC
  Filled 2023-09-09: qty 60, 30d supply, fill #0

## 2023-09-09 NOTE — Progress Notes (Signed)
Progress Note: General Surgery Service   Chief Complaint/Subjective: Pain controlled and tolerating diet. Passing flatus but no BM.  Objective: Vital signs in last 24 hours: Temp:  [97.5 F (36.4 C)-99.2 F (37.3 C)] 97.5 F (36.4 C) (12/24 0746) Pulse Rate:  [81-90] 88 (12/24 0746) Resp:  [12-19] 12 (12/24 0746) BP: (107-128)/(65-73) 128/72 (12/24 0746) SpO2:  [92 %-95 %] 93 % (12/24 0746) Last BM Date : 09/04/23  Intake/Output from previous day: 12/23 0701 - 12/24 0700 In: 718.5 [P.O.:520; IV Piggyback:198.5] Out: 1340 [Urine:1300; Drains:40] Intake/Output this shift: Total I/O In: -  Out: 505 [Urine:500; Drains:5]  GI: Abd soft, incisions c/d/I w/ glue Drain SS  Lab Results: CBC  Recent Labs    09/08/23 1016 09/09/23 0459  WBC 14.0* 13.2*  HGB 9.3* 10.2*  HCT 29.1* 31.9*  PLT 203 227   BMET Recent Labs    09/08/23 0417 09/09/23 0459  NA 131* 132*  K 3.5 3.3*  CL 95* 94*  CO2 23 26  GLUCOSE 100* 112*  BUN 38* 31*  CREATININE 2.69* 1.85*  CALCIUM 8.6* 9.2   PT/INR No results for input(s): "LABPROT", "INR" in the last 72 hours. ABG No results for input(s): "PHART", "HCO3" in the last 72 hours.  Invalid input(s): "PCO2", "PO2"  Anti-infectives: Anti-infectives (From admission, onward)    Start     Dose/Rate Route Frequency Ordered Stop   09/05/23 2300  piperacillin-tazobactam (ZOSYN) IVPB 3.375 g        3.375 g 12.5 mL/hr over 240 Minutes Intravenous Every 8 hours 09/05/23 2143     09/05/23 1615  piperacillin-tazobactam (ZOSYN) IVPB 3.375 g        3.375 g 12.5 mL/hr over 240 Minutes Intravenous  Once 09/05/23 1604 09/05/23 1820       Medications: Scheduled Meds:  carvedilol  6.25 mg Oral BID WC   enoxaparin (LOVENOX) injection  40 mg Subcutaneous Q24H   rosuvastatin  20 mg Oral Daily   sertraline  100 mg Oral Daily   sodium chloride flush  3 mL Intravenous Q12H   Continuous Infusions:  piperacillin-tazobactam (ZOSYN)  IV 3.375 g  (09/09/23 1013)   PRN Meds:.acetaminophen **OR** acetaminophen, albuterol, diphenhydrAMINE, diphenhydrAMINE, guaiFENesin-dextromethorphan, hydrocortisone cream, HYDROmorphone (DILAUDID) injection, methocarbamol, ondansetron, oxyCODONE  Assessment/Plan: [name] is a 62 year old male with multiple medical issues including heart failure and atrial fibrillation on Eliquis, who presented on 09/05/23, to [facility] with appendicitis and underwent  POD3 s/p  laparoscopic appendectomy on 09/06/23, with Dr. Sheliah Hatch. - tolerating diet - hgb 10.2 from 9.3 yesterday - ok to resume PM dose eliquis and would recheck CBC in AM - Drain SS - continue on dc - Cont IV abx - transition PO on dc to complete 5 days course AKI - Cr 1.8 from 2.6, improving   FEN: reg ID: [email or web address] VTE: ok to resume eliquis this evening   - Per primary -  A fib on eliquis HF with AICD CAD HTN OSA AKI   Juliet Rude, [facility] Surgery 09/09/2023, 10:18 AM Please see Amion for pager number during day hours 7:00am-4:30pm

## 2023-09-09 NOTE — Plan of Care (Signed)

## 2023-09-09 NOTE — Progress Notes (Addendum)
1000 JP drain teaching completed patient able to drain and recharge  JP drain without assistance  1135 IV removed patient waiting on wife to arrive for discharge

## 2023-09-09 NOTE — Discharge Summary (Signed)
Physician Discharge Summary  John [name] ZOX:[id number] DOB: 1961/04/27 DOA: 09/05/2023  PCP: Shelva Majestic, MD  Admit date: 09/05/2023 Discharge date: 09/09/2023  Admitted From: home Disposition:  home  Recommendations for Outpatient Follow-up:  Follow up with PCP in 1-2 weeks Follow-up with general surgery as scheduled  Home Health: None Equipment/Devices: None  Discharge Condition: Stable CODE STATUS: Full Diet recommendation: Low-salt low-fat diet  Brief/Interim Summary: John [name] is an 62 y.o. male past medical history significant for systolic heart failure with an EF of 30%, polymorphic VT status post AICD, paroxysmal atrial fibrillation on Eliquis asthma comes in for abdominal pain CT scan of the abdomen pelvis show appendicitis with appendicolith and periappendiceal stranding, 3.5 cm liver lesion in the left lobe which appears to be stable from a CT scan in October 2023  Patient admitted as above with acute intractable abdominal pain found to have acute appendicitis, status post laparoscopic appendectomy 09/06/2023, tolerating well, postop drain clean dry intact, to continue at discharge per surgery.  Patient otherwise stable and agreeable for discharge home, tolerating p.o. now, completed antibiotics prior to discharge.  Otherwise labs are all stabilizing/trending in the correct direction.  Patient is approved to resume Eliquis tonight 09/09/2023 per discussion with general surgery.  Lengthy discussion at bedside about monitoring for signs or symptoms of bleeding.   Discharge Diagnoses:  Principal Problem:   Acute appendicitis Active Problems:   Essential hypertension   Hyperlipidemia   OSA (obstructive sleep apnea)   Paroxysmal atrial fibrillation (HCC)   Chronic combined systolic and diastolic CHF, NYHA class 3 (HCC)   S/P ICD (internal cardiac defibrillator) procedure   GAD (generalized anxiety disorder)   CAD (coronary artery disease)   Liver lesion,  left lobe    Discharge Instructions   Allergies as of 09/09/2023       Reactions   Chlorhexidine Itching   Cymbalta [duloxetine Hcl] Nausea Only, Other (See Comments)   Night sweats    Hydrocodone Rash        Medication List     STOP taking these medications    ketorolac 10 MG tablet Commonly known as: TORADOL   methylPREDNISolone 4 MG Tbpk tablet Commonly known as: MEDROL DOSEPAK   predniSONE 20 MG tablet Commonly known as: DELTASONE       TAKE these medications    acetaminophen 500 MG tablet Commonly known as: TYLENOL Take 1,000 mg by mouth daily.   albuterol 108 (90 Base) MCG/ACT inhaler Commonly known as: VENTOLIN HFA Inhale 2 puffs into the lungs every 6 (six) hours as needed for wheezing or shortness of breath.   apixaban 5 MG Tabs tablet Commonly known as: ELIQUIS Take 1 tablet (5 mg total) by mouth 2 (two) times daily.   bismuth subsalicylate 262 MG/15ML suspension Commonly known as: PEPTO BISMOL Take 30 mLs by mouth every 6 (six) hours as needed for diarrhea or loose stools.   carvedilol 6.25 MG tablet Commonly known as: COREG Take 1 tablet (6.25 mg total) by mouth 2 (two) times daily with a meal. What changed:  medication strength See the new instructions.   dapagliflozin propanediol 10 MG Tabs tablet Commonly known as: Farxiga Take 1 tablet (10 mg total) by mouth daily before breakfast.   Entresto 97-103 MG Generic drug: sacubitril-valsartan Take 1 tablet by mouth 2 (two) times daily. Start taking on: September 13, 2023 What changed: These instructions start on September 13, 2023. If you are unsure what to do until then, ask your doctor or other  care provider.   ipratropium-albuterol 0.5-2.5 (3) MG/3ML Soln Commonly known as: DUONEB Take 3 mLs by nebulization every 4 (four) hours as needed.   meloxicam 15 MG tablet Commonly known as: MOBIC TAKE 1 TABLET (15 MG TOTAL) BY MOUTH DAILY.   multivitamin with minerals Tabs tablet Take 1  tablet by mouth in the morning.   oxyCODONE 5 MG immediate release tablet Commonly known as: Oxy IR/ROXICODONE Take 1-2 tablets (5-10 mg total) by mouth every 4 (four) hours as needed for up to 5 days for moderate pain (pain score 4-6) or severe pain (pain score 7-10) (5 mg for moderate, 10 mg for severe).   Pepcid Complete 10-800-165 MG chewable tablet Generic drug: famotidine-calcium carbonate-magnesium hydroxide Chew 1 tablet by mouth 2 (two) times daily as needed.   rosuvastatin 20 MG tablet Commonly known as: CRESTOR TAKE 1 TABLET BY MOUTH EVERY DAY   Semaglutide-Weight Management 0.5 MG/0.5ML Soaj Inject 0.5 mg into the skin once a week.   sertraline 100 MG tablet Commonly known as: ZOLOFT TAKE 1 TABLET BY MOUTH EVERY DAY   sildenafil 100 MG tablet Commonly known as: VIAGRA TAKE 1 TABLET (100 MG TOTAL) BY MOUTH AT BEDTIME AS NEEDED FOR ERECTILE DYSFUNCTION.   spironolactone 50 MG tablet Commonly known as: ALDACTONE Take 1 tablet (50 mg total) by mouth daily. Start taking on: September 13, 2023 What changed: These instructions start on September 13, 2023. If you are unsure what to do until then, ask your doctor or other care provider.   torsemide 20 MG tablet Commonly known as: DEMADEX Take 2 tablets (40 mg total) by mouth daily.   traZODone 50 MG tablet Commonly known as: DESYREL Take 1 tablet (50 mg total) by mouth at bedtime.        Follow-up Information     Kinsinger, De Blanch, MD. Call.   Specialty: General Surgery Why: surgical follow up We are making a follow up appointment for you., Please call to confirm appointment time., Arrive 15 minutes early to complete check in, and bring photo ID and insurance card. Contact information: [address] [place] [address] [phone]         [facility], [place]. Call.   Specialty: General Surgery Why: nurse visit drain evaluation and possible removal We are making a follow up  appointment for you., Please call to confirm appointment time., Arrive 30 minutes early to complete check in, and bring photo ID and insurance card. Contact information: [address] [place] [address] [phone]               Allergies  Allergen Reactions   Chlorhexidine Itching   Cymbalta [Duloxetine Hcl] Nausea Only and Other (See Comments)    Night sweats     Hydrocodone Rash    Consultations: General surgery  Procedures/Studies: CT ABDOMEN PELVIS W CONTRAST Result Date: 09/05/2023 CLINICAL DATA:  Right lower quadrant abdominal pain. Generalized abdominal pain since yesterday. Diarrhea for 2 days. EXAM: CT ABDOMEN AND PELVIS WITH CONTRAST TECHNIQUE: Multidetector CT imaging of the abdomen and pelvis was performed using the standard protocol following bolus administration of intravenous contrast. RADIATION DOSE REDUCTION: This exam was performed according to the departmental dose-optimization program which includes automated exposure control, adjustment of the mA and/or kV according to patient size and/or use of iterative reconstruction technique. CONTRAST:  OMNIPAQUE IOHEXOL 300 MG/ML  SOLN COMPARISON:  06/26/2022 FINDINGS: Lower chest: Lung bases are clear. Hepatobiliary: Focal lesion in  the lateral segment left lobe of liver measuring 3.5 cm diameter. The lesion appears unchanged since previous study. There is a thin enhancing capsule suggested. Although the lesion does appear stable since the previous study, this could still represent a metastasis or hepatocellular carcinoma and follow-up with elective MRI of the liver is recommended for best characterization. Gallbladder and bile ducts are normal. Pancreas: Unremarkable. No pancreatic ductal dilatation or surrounding inflammatory changes. Spleen: Normal in size without focal abnormality. Adrenals/Urinary Tract: Adrenal glands are unremarkable. Kidneys are normal, without renal calculi,  focal lesion, or hydronephrosis. Bladder is decompressed. Stomach/Bowel: Stomach, small bowel, and colon are mostly decompressed. The appendix is mildly distended with diameter measuring 12 mm. There is a small appendicolith. There is stranding around the appendix. Changes are consistent with early acute appendicitis. No abscess. Vascular/Lymphatic: Aortic atherosclerosis. No enlarged abdominal or pelvic lymph nodes. Reproductive: Prostate is unremarkable. Other: No abdominal wall hernia or abnormality. No abdominopelvic ascites. Musculoskeletal: Degenerative changes in the spine. Kyphoplasty changes at T12. Bilateral hip arthroplasties. IMPRESSION: 1. Changes of acute appendicitis with distended appendix, appendicolith, and periappendiceal stranding. No abscess. 2. 3.5 cm diameter hypoenhancing lesion in the lateral segment left lobe of the liver. Although this appears stable from prior study, differential diagnosis would still included hepatocellular carcinoma or metastasis based on CT appearance. Recommend follow-up with elective MRI of the liver for better characterization. 3. Aortic atherosclerosis. Electronically Signed   By: Burman Nieves M.D.   On: 09/05/2023 18:11   DG Chest 2 View Result Date: 09/05/2023 CLINICAL DATA:  Abdomen pain EXAM: CHEST - 2 VIEW COMPARISON:  11/11/2022 FINDINGS: Bilateral shoulder replacements. Left-sided pacing device as before. Streaky bibasilar atelectasis. Stable cardiomediastinal silhouette. Aortic atherosclerosis. No pneumothorax. IMPRESSION: Streaky bibasilar atelectasis. Electronically Signed   By: Jasmine Pang M.D.   On: 09/05/2023 16:55   CUP PACEART REMOTE DEVICE CHECK Result Date: 08/13/2023 Scheduled remote reviewed. Normal device function.  1 AMS, 8 seconds in duration. Next remote 91 days. [place], [facility]    Subjective: No acute issues or events overnight denies nausea vomiting diarrhea constipation headache fevers chills or chest pain   Discharge  Exam: Vitals:   09/09/23 0746 09/09/23 1123  BP: 128/72 111/62  Pulse: 88 80  Resp: 12 17  Temp: (!) 97.5 F (36.4 C) 98.2 F (36.8 C)  SpO2: 93% 97%   Vitals:   09/08/23 1943 09/08/23 2245 09/09/23 0746 09/09/23 1123  BP: 118/66 124/73 128/72 111/62  Pulse: 87 88 88 80  Resp: 15 19 12 17   Temp: 99.2 F (37.3 C) 98.6 F (37 C) (!) 97.5 F (36.4 C) 98.2 F (36.8 C)  TempSrc: Oral Oral Oral Oral  SpO2: 95% 94% 93% 97%  Weight:      Height:        General: Pt is alert, awake, not in acute distress Cardiovascular: RRR, S1/S2 +, no rubs, no gallops Respiratory: CTA bilaterally, no wheezing, no rhonchi Abdominal: Soft, NT, ND, bowel sounds + Extremities: no edema, no cyanosis    The results of significant diagnostics from this hospitalization (including imaging, microbiology, ancillary and laboratory) are listed below for reference.     Microbiology: No results found for this or any previous visit (from the past 240 hours).   Labs: BNP (last 3 results) Recent Labs    10/23/22 1614  BNP 625.0*   Basic Metabolic Panel: Recent Labs  Lab 09/05/23 1428 09/06/23 0423 09/07/23 0454 09/08/23 0417 09/09/23 0459  NA 134* 131* 133* 131* 132*  K 3.9 3.7 4.7 3.5 3.3*  CL 93* 99 96* 95* 94*  CO2 30 28 24 23 26   GLUCOSE 130* 126* 135* 100* 112*  BUN 18 16 29* 38* 31*  CREATININE 1.35* 1.43* 2.92* 2.69* 1.85*  CALCIUM 9.9 8.4* 8.6* 8.6* 9.2  MG  --  2.3  --   --   --    Liver Function Tests: Recent Labs  Lab 09/05/23 1428  AST 15  ALT 23  ALKPHOS 52  BILITOT 1.1  PROT 7.0  ALBUMIN 4.2   Recent Labs  Lab 09/05/23 1428  LIPASE 14   No results for input(s): "AMMONIA" in the last 168 hours. CBC: Recent Labs  Lab 09/05/23 1428 09/06/23 0423 09/07/23 0454 09/08/23 1016 09/09/23 0459  WBC 26.3* 22.1* 20.0* 14.0* 13.2*  HGB 13.2 12.1* 11.3* 9.3* 10.2*  HCT 39.8 37.5* 35.2* 29.1* 31.9*  MCV 98.3 101.1* 102.9* 101.7* 101.6*  PLT 243 199 169 203 227    Cardiac Enzymes: No results for input(s): "CKTOTAL", "CKMB", "CKMBINDEX", "TROPONINI" in the last 168 hours. BNP: Invalid input(s): "POCBNP" CBG: Recent Labs  Lab 09/08/23 0748 09/09/23 0743  GLUCAP 103* 115*   D-Dimer No results for input(s): "DDIMER" in the last 72 hours. Hgb A1c No results for input(s): "HGBA1C" in the last 72 hours. Lipid Profile No results for input(s): "CHOL", "HDL", "LDLCALC", "TRIG", "CHOLHDL", "LDLDIRECT" in the last 72 hours. Thyroid function studies No results for input(s): "TSH", "T4TOTAL", "T3FREE", "THYROIDAB" in the last 72 hours.  Invalid input(s): "FREET3" Anemia work up No results for input(s): "VITAMINB12", "FOLATE", "FERRITIN", "TIBC", "IRON", "RETICCTPCT" in the last 72 hours. Urinalysis    Component Value Date/Time   COLORURINE YELLOW 09/05/2023 1428   APPEARANCEUR CLEAR 09/05/2023 1428   LABSPEC >1.046 (H) 09/05/2023 1428   PHURINE 6.0 09/05/2023 1428   GLUCOSEU 250 (A) 09/05/2023 1428   HGBUR NEGATIVE 09/05/2023 1428   BILIRUBINUR NEGATIVE 09/05/2023 1428   KETONESUR NEGATIVE 09/05/2023 1428   PROTEINUR 30 (A) 09/05/2023 1428   UROBILINOGEN 0.2 03/21/2015 0903   NITRITE NEGATIVE 09/05/2023 1428   LEUKOCYTESUR NEGATIVE 09/05/2023 1428   Sepsis Labs Recent Labs  Lab 09/06/23 0423 09/07/23 0454 09/08/23 1016 09/09/23 0459  WBC 22.1* 20.0* 14.0* 13.2*   Microbiology No results found for this or any previous visit (from the past 240 hours).   Time coordinating discharge: Over 30 minutes  SIGNED:   Azucena Fallen, DO [facility] 09/09/2023, 5:39 PM Pager   If 7PM-7AM, please contact night-coverage [email or web address]

## 2023-09-11 ENCOUNTER — Other Ambulatory Visit (HOSPITAL_COMMUNITY): Payer: Self-pay

## 2023-09-15 ENCOUNTER — Ambulatory Visit: Payer: Commercial Managed Care - HMO | Admitting: Family Medicine

## 2023-09-16 ENCOUNTER — Telehealth (HOSPITAL_COMMUNITY): Payer: Self-pay

## 2023-09-16 NOTE — Progress Notes (Signed)
Remote ICD transmission.   

## 2023-09-16 NOTE — Telephone Encounter (Signed)
 No response from in regards to CR.  Closed referral

## 2023-09-18 ENCOUNTER — Telehealth: Payer: Self-pay | Admitting: Cardiovascular Disease

## 2023-09-18 MED ORDER — TORSEMIDE 20 MG PO TABS: 40.0000 mg | ORAL_TABLET | Freq: Every day | ORAL | 0 refills | Status: DC

## 2023-09-18 MED ORDER — CARVEDILOL 6.25 MG PO TABS: 6.2500 mg | ORAL_TABLET | Freq: Two times a day (BID) | ORAL | 0 refills | Status: DC

## 2023-09-18 NOTE — Telephone Encounter (Signed)
 Pt c/o medication issue:  1. Name of Medication: carvedilol  (COREG ) 6.25 MG tablet   2. How are you currently taking this medication (dosage and times per day)?   3. Are you having a reaction (difficulty breathing--STAT)?   4. What is your medication issue? Patient's wife is requesting call back to get clarification on how this patient is to take this medication.

## 2023-09-18 NOTE — Telephone Encounter (Signed)
 Called and spoke to patient and his wife. Verified name and DOB. Patient was admitted for his appendix and discharged 12/24. She is calling due to changes in his Carvedilol , Torsemide  and Spironolactone . Medication was sent to [facility] pharmacy at discharge. Prescriptions sent to [facility] pharmacy per request.  Medication changes: Carvedilol  6.25 mg two time daily Torsemide  20 mg (40 mg) daily Spironolactone  50 mg daily.

## 2023-09-19 ENCOUNTER — Ambulatory Visit (HOSPITAL_COMMUNITY): Admission: RE | Admit: 2023-09-19 | Payer: Commercial Managed Care - HMO | Source: Ambulatory Visit

## 2023-09-26 ENCOUNTER — Ambulatory Visit: Payer: Managed Care, Other (non HMO) | Admitting: Podiatry

## 2023-10-15 ENCOUNTER — Other Ambulatory Visit (HOSPITAL_COMMUNITY): Payer: Self-pay | Admitting: Cardiology

## 2023-10-17 ENCOUNTER — Ambulatory Visit (HOSPITAL_COMMUNITY)
Admission: RE | Admit: 2023-10-17 | Payer: Self-pay | Source: Ambulatory Visit | Attending: Cardiovascular Disease | Admitting: Cardiovascular Disease

## 2023-10-28 ENCOUNTER — Ambulatory Visit: Payer: Commercial Managed Care - HMO

## 2023-11-03 ENCOUNTER — Other Ambulatory Visit: Payer: Self-pay

## 2023-11-03 ENCOUNTER — Telehealth: Payer: Self-pay

## 2023-11-03 NOTE — Telephone Encounter (Addendum)
Medication changes placed in chart.Spoke to patient appointment scheduled with Dr.Harding 2/21 at 10:40 am.Appointment scheduled with Dr.Croitoru 5/5 at 2:40 pm.

## 2023-11-03 NOTE — Telephone Encounter (Signed)
Appropriate therapy for ventricular tachycardia with a rate of about 200 bpm.  Thankfully, a single ATP intervention was necessary, without shock, and he was only minimally symptomatic. Called and spoke to his wife Lynden Ang.  He has lost 35 pounds since his appendectomy.  He has had persistent hypotension in the 80/50 range and has had a lot of weakness and dizziness.  This is why they cut back on the carvedilol.  In addition, over the weekend he had a few alcoholic drinks, which may have led to additional hypovolemia. We discussed the purpose of the different medications that he takes for his cardiomyopathy and pointed out that in addition to its impact on blood pressure, the carvedilol is necessary for rhythm control. Asked Lynden Ang to: -  put him back on carvedilol 6.25 mg twice daily  -  reduce the spironolactone to 25 mg daily and  -  reduce the Entresto to half a tablet of the 97-103 mg twice daily. Please make those changes in the medication list. He was supposed to have a recent appointment with Dr. Herbie Baltimore that was canceled.  Can we reschedule that? If Dr. Herbie Baltimore does not have any open appointments, please make an appointment with APP soon, to follow the echocardiogram scheduled Feb  20th.   Please make an appointment to see me in May as well.

## 2023-11-03 NOTE — Telephone Encounter (Signed)
Alert remote transmission: ATP  1 VF detections on 11/01/23 at 0147 x 12 sec treated with ATP x 1.  V>A.   AMS events, longet 3 min 32 sec with many with 1:1 AV.  HF diagnostics recently abnormal.   Patient states he thinks that he was up going to bathroom at that time and felt lightheaded.  He, also, has been holding his carvedilol since last Wednesday due to low bp's (80-90's/50's). They tried only taking 1 dose of carvedilol but was still hypotensive. He has been having more issues with dizziness and near syncopal feelings with positional changes as well.  [place] DMV driving restrictions given X 64mth.s Patient voiced understanding and asked that when we call back, we call his wife's number.

## 2023-11-04 ENCOUNTER — Other Ambulatory Visit: Payer: Self-pay | Admitting: Podiatry

## 2023-11-06 ENCOUNTER — Ambulatory Visit (HOSPITAL_COMMUNITY): Payer: Self-pay

## 2023-11-07 ENCOUNTER — Ambulatory Visit: Payer: Self-pay | Admitting: Cardiology

## 2023-11-08 ENCOUNTER — Other Ambulatory Visit (HOSPITAL_COMMUNITY): Payer: Self-pay | Admitting: Cardiology

## 2023-11-10 ENCOUNTER — Ambulatory Visit: Payer: Self-pay | Admitting: Family Medicine

## 2023-11-10 NOTE — Telephone Encounter (Signed)
 Pt scheduled for 02/25.

## 2023-11-10 NOTE — Telephone Encounter (Addendum)
  Chief Complaint: cough Symptoms: cough Frequency: about a week ago it started Pertinent Negatives: Patient denies fever Disposition: [] ED /[] Urgent Care (no appt availability in office) / [x] Appointment(In office/virtual)/ []  [place] [facility]/ [] Home Care/ [] Refused Recommended Disposition /[] [place] [facility]/ []  Follow-up with PCP Additional Notes: Pt states that he is concerned that he has bronchitis. Pt states that he is coughing up green phlegm. Pt states that he would like an appt.  Copied from CRM [id number]. Topic: Clinical - Red Word Triage >> Nov 10, 2023 10:35 AM Deaijah H wrote: Red Word that prompted transfer to Nurse Triage: Very bad bronchitis ; gotten worse Reason for Disposition  [1] Known COPD or other severe lung disease (i.e., bronchiectasis, cystic fibrosis, lung surgery) AND [2] worsening symptoms (i.e., increased sputum purulence or amount, increased breathing difficulty  Answer Assessment - Initial Assessment Questions 1. ONSET: "When did the cough begin?"      About a week ago 2. SEVERITY: "How bad is the cough today?"      Lay down is bad 3. SPUTUM: "Describe the color of your sputum" (none, dry cough; clear, white, yellow, green)     green 4. HEMOPTYSIS: "Are you coughing up any blood?" If so ask: "How much?" (flecks, streaks, tablespoons, etc.)     denies 5. DIFFICULTY BREATHING: "Are you having difficulty breathing?" If Yes, ask: "How bad is it?" (e.g., mild, moderate, severe)    - MILD: No SOB at rest, mild SOB with walking, speaks normally in sentences, can lie down, no retractions, pulse < 100.    - MODERATE: SOB at rest, SOB with minimal exertion and prefers to sit, cannot lie down flat, speaks in phrases, mild retractions, audible wheezing, pulse 100-120.    - SEVERE: Very SOB at rest, speaks in single words, struggling to breathe, sitting hunched forward, retractions, pulse > 120      moderate 6. FEVER: "Do you have a fever?" If Yes, ask: "What  is your temperature, how was it measured, and when did it start?"     denies 7. CARDIAC HISTORY: "Do you have any history of heart disease?" (e.g., heart attack, congestive heart failure)      Heart failure 8. LUNG HISTORY: "Do you have any history of lung disease?"  (e.g., pulmonary embolus, asthma, emphysema)     Lung failure 9. PE RISK FACTORS: "Do you have a history of blood clots?" (or: recent major surgery, recent prolonged travel, bedridden)     denies 10. OTHER SYMPTOMS: "Do you have any other symptoms?" (e.g., runny nose, wheezing, chest pain)       wheezing  Protocols used: Cough - Acute Productive-A-AH

## 2023-11-11 ENCOUNTER — Ambulatory Visit: Payer: Self-pay | Admitting: Physician Assistant

## 2023-11-12 ENCOUNTER — Ambulatory Visit (INDEPENDENT_AMBULATORY_CARE_PROVIDER_SITE_OTHER): Payer: Self-pay

## 2023-11-12 DIAGNOSIS — I428 Other cardiomyopathies: Secondary | ICD-10-CM | POA: Diagnosis not present

## 2023-11-13 ENCOUNTER — Encounter: Payer: Self-pay | Admitting: Family Medicine

## 2023-11-13 ENCOUNTER — Ambulatory Visit (INDEPENDENT_AMBULATORY_CARE_PROVIDER_SITE_OTHER): Payer: Self-pay | Admitting: Family Medicine

## 2023-11-13 VITALS — BP 95/68 | HR 73 | Temp 98.1°F | Ht 70.0 in | Wt 245.2 lb

## 2023-11-13 DIAGNOSIS — R059 Cough, unspecified: Secondary | ICD-10-CM

## 2023-11-13 DIAGNOSIS — I10 Essential (primary) hypertension: Secondary | ICD-10-CM

## 2023-11-13 DIAGNOSIS — M47816 Spondylosis without myelopathy or radiculopathy, lumbar region: Secondary | ICD-10-CM

## 2023-11-13 LAB — CUP PACEART REMOTE DEVICE CHECK
Battery Remaining Longevity: 83 mo
Battery Remaining Percentage: 86 %
Battery Voltage: 3.01 V
Brady Statistic AP VP Percent: 4.9 %
Brady Statistic AP VS Percent: 74 %
Brady Statistic AS VP Percent: 1 %
Brady Statistic AS VS Percent: 18 %
Brady Statistic RA Percent Paced: 75 %
Brady Statistic RV Percent Paced: 4.9 %
Date Time Interrogation Session: 20250226020122
HighPow Impedance: 84 Ohm
Implantable Lead Connection Status: 753985
Implantable Lead Connection Status: 753985
Implantable Lead Implant Date: 20190821
Implantable Lead Implant Date: 20240226
Implantable Lead Location: 753859
Implantable Lead Location: 753860
Implantable Lead Model: 7122
Implantable Pulse Generator Implant Date: 20240226
Lead Channel Impedance Value: 440 Ohm
Lead Channel Impedance Value: 460 Ohm
Lead Channel Pacing Threshold Amplitude: 0.75 V
Lead Channel Pacing Threshold Amplitude: 0.75 V
Lead Channel Pacing Threshold Pulse Width: 0.5 ms
Lead Channel Pacing Threshold Pulse Width: 0.5 ms
Lead Channel Sensing Intrinsic Amplitude: 1.8 mV
Lead Channel Sensing Intrinsic Amplitude: 6.2 mV
Lead Channel Setting Pacing Amplitude: 2 V
Lead Channel Setting Pacing Amplitude: 2.5 V
Lead Channel Setting Pacing Pulse Width: 0.5 ms
Lead Channel Setting Sensing Sensitivity: 0.5 mV
Pulse Gen Serial Number: 211012451
Zone Setting Status: 755011

## 2023-11-13 MED ORDER — AZITHROMYCIN 250 MG PO TABS: ORAL_TABLET | ORAL | 0 refills | Status: DC

## 2023-11-13 NOTE — Patient Instructions (Signed)
 It was very nice to see you today!  I think you probably have bronchitis.  Start the azithromycin.  I will refer you to see sports medicine for your back.  Return if symptoms worsen or fail to improve.   Take care, Dr Jimmey Ralph  PLEASE NOTE:  If you had any lab tests, please let us know if you have not heard back within a few days. You may see your results on mychart before we have a chance to review them but we will give you a call once they are reviewed by [place].   If we ordered any referrals today, please let us know if you have not heard from their office within the next week.   If you had any urgent prescriptions sent in today, please check with the pharmacy within an hour of our visit to make sure the prescription was transmitted appropriately.   Please try these tips to maintain a healthy lifestyle:  Eat at least 3 REAL meals and 1-2 snacks per day.  Aim for no more than 5 hours between eating.  If you eat breakfast, please do so within one hour of getting up.   Each meal should contain half fruits/vegetables, one quarter protein, and one quarter carbs (no bigger than a computer mouse)  Cut down on sweet beverages. This includes juice, soda, and sweet tea.   Drink at least 1 glass of water with each meal and aim for at least 8 glasses per day  Exercise at least 150 minutes every week.

## 2023-11-13 NOTE — Progress Notes (Signed)
   John [name] is a 63 y.o. male who presents today for an office visit.  Assessment/Plan:  New/Acute Problems: Cough  No red flags.  Given length of symptoms would be reasonable to start antibiotics at this point.  Will start azithromycin.  He can continue over-the-counter meds as needed as well.  Encouraged hydration.  We discussed reasons to return to care.  Follow-up as needed.  Chronic Problems Addressed Today: Essential Hypertension  Blood pressure at goal today current regimen per cardiology with Coreg, spironolactone, and Entresto.  Lumbar Spondylosis /osteoarthritis Symptoms are not controlled.  He is interested in sports medicine referral.  Will place referral today.     Subjective:  HPI:  See Assessment / plan for status of chronic conditions. Patient here with cough. This started about 10 days ago. Wife with similar symptoms last week. Tried OTC medications without much improvement. No fevers or chills. No nausea or vomiting. No sinus congestion.  Symptoms are not improving.  No chest pain.  No shortness of breath.  He has also had ongoing back pain for multiple years.  Has known history of osteoarthritis and lumbar spondylosis.  He has previously seen orthopedics for hip pain but does not have a regular orthopedic office that he sees.  He is interested in seeing sports medicine for management for his joint and back pain.       Objective:  Physical Exam: BP 95/68   Pulse 73   Temp 98.1 F (36.7 C) (Temporal)   Ht 5\' 10"  (1.778 m)   Wt 245 lb 3.2 oz (111.2 kg)   SpO2 96%   BMI 35.18 kg/m   Gen: No acute distress, resting comfortably CV: Regular rate and rhythm with no murmurs appreciated Pulm: Normal work of breathing, occasional and expiratory wheeze and rhonchi noted throughout.  Speaking in full sentences. Neuro: Grossly normal, moves all extremities Psych: Normal affect and thought content      Indiya Izquierdo M. Jimmey Ralph, MD 11/13/2023 1:44 PM

## 2023-11-21 ENCOUNTER — Other Ambulatory Visit (HOSPITAL_COMMUNITY): Payer: Self-pay | Admitting: Cardiology

## 2023-11-23 ENCOUNTER — Encounter: Payer: Self-pay | Admitting: Cardiovascular Disease

## 2023-11-27 ENCOUNTER — Ambulatory Visit: Payer: Self-pay | Admitting: Family Medicine

## 2023-11-28 ENCOUNTER — Telehealth: Payer: Self-pay | Admitting: *Deleted

## 2023-11-28 NOTE — Telephone Encounter (Signed)
   Pre-operative Risk Assessment    Patient Name: Demontre Padin  DOB: October 24, 1960 MRN: [id number]   Date of last office visit: 08/13/23 DR. CROITORU Date of next office visit: 12/02/23 HAO MENG, PAC   Request for Surgical Clearance    Procedure:   CATARACT EXTRACTION WITH INTRAOCULAR LENS IMPLANT LEFT EYE TO BE DONE ON 02/23/24 ; THIS WILL BE FOLLOWED BY THE RIGHT EYE ON 03/15/24  Date of Surgery:  Clearance 02/23/24 -LEFT EYE TO BE DONE ON 02/23/24 ; THIS WILL BE FOLLOWED BY THE RIGHT EYE ON 03/15/24                               Surgeon:  NOT LISTED  Surgeon's Group or Practice Name:  [facility] Phone number:  [phone] Fax number:  [phone]   Type of Clearance Requested:   - Medical ; PER CLEARANCE FORM STATES NO MEDICATIONS NEED TO BE HELD INCLUDING BLOOD THINNERS   Type of Anesthesia:   TOPICAL ANESTHESIA WITH IV MEDICATION   Additional requests/questions:    Elpidio Anis   11/28/2023, 10:14 AM

## 2023-11-28 NOTE — Telephone Encounter (Signed)
   Name: John [name]  DOB: [birth year]  MRN: [id number]  Primary Cardiologist: Thurmon Fair, MD  Chart reviewed as part of pre-operative protocol coverage. The patient has an upcoming visit scheduled with Azalee Course, PA on 12/02/2023  at which time clearance can be addressed in case there are any issues that would impact surgical recommendations.  Cataract surgery is not scheduled until 02/2024 as below. I added preop FYI to appointment note so that provider is aware to address at time of outpatient visit.  Per office protocol the cardiology provider should forward their finalized clearance decision and recommendations regarding antiplatelet therapy to the requesting party below.   I will route this message as FYI to requesting party and remove this message from the preop box as separate preop APP input not needed at this time.   Please call with any questions.  Joylene Grapes, NP  11/28/2023, 10:34 AM

## 2023-11-28 NOTE — Telephone Encounter (Signed)
 [facility] FAX#  [phone]

## 2023-12-01 ENCOUNTER — Ambulatory Visit (HOSPITAL_COMMUNITY): Payer: Self-pay | Attending: Cardiology

## 2023-12-01 DIAGNOSIS — I5042 Chronic combined systolic (congestive) and diastolic (congestive) heart failure: Secondary | ICD-10-CM | POA: Insufficient documentation

## 2023-12-01 LAB — ECHOCARDIOGRAM COMPLETE
Area-P 1/2: 3.06 cm2
S' Lateral: 4.8 cm

## 2023-12-01 MED ORDER — PERFLUTREN LIPID MICROSPHERE
1.0000 mL | INTRAVENOUS | Status: AC | PRN
  Administered 2023-12-01: 2 mL via INTRAVENOUS

## 2023-12-02 ENCOUNTER — Encounter: Payer: Self-pay | Admitting: Cardiovascular Disease

## 2023-12-02 ENCOUNTER — Ambulatory Visit: Payer: Self-pay | Attending: Physician Assistant | Admitting: Physician Assistant

## 2023-12-02 VITALS — BP 120/80 | HR 73 | Ht 71.0 in | Wt 250.0 lb

## 2023-12-02 DIAGNOSIS — Z95810 Presence of automatic (implantable) cardiac defibrillator: Secondary | ICD-10-CM | POA: Diagnosis not present

## 2023-12-02 DIAGNOSIS — I472 Ventricular tachycardia, unspecified: Secondary | ICD-10-CM

## 2023-12-02 DIAGNOSIS — I428 Other cardiomyopathies: Secondary | ICD-10-CM

## 2023-12-02 DIAGNOSIS — I4819 Other persistent atrial fibrillation: Secondary | ICD-10-CM

## 2023-12-02 DIAGNOSIS — I2510 Atherosclerotic heart disease of native coronary artery without angina pectoris: Secondary | ICD-10-CM

## 2023-12-02 DIAGNOSIS — I480 Paroxysmal atrial fibrillation: Secondary | ICD-10-CM

## 2023-12-02 NOTE — Progress Notes (Unsigned)
 Cardiology Office Note:  .   Date:  12/03/2023  ID:  [name], DOB 05-15-1961, MRN [id number] PCP: Shelva Majestic, MD  [facility] Providers Cardiologist:  Thurmon Fair, MD     History of Present Illness: .   Chibuikem Thang is a 63 y.o. male with past medical history of NICM, persistent atrial fibrillation, polymorphic VT, aortic atherosclerosis, moderate nonobstructive CAD, OSA on CPAP and severe obesity.  His EF has been as low as 15 to 20% in the past.  Most recent echocardiogram obtained in May 2023 showed EF 30 to 35%.  He had appropriate defibrillator shock for polymorphic VT with a heart rate of 226 bpm in November 2023.  He underwent successful cardioversion for persistent atrial fibrillation in January 2024.  He also underwent upgrade of his ICD to Abbott dual-chamber device to allow atrial pacing in February 2024 by Dr. Lalla Brothers.  Since the addition of atrial lead, his polymorphic VT occurrence has significantly reduced.  Coronary CTA in February 2024 showed calcium score 1750 which placed the patient in 98th percentile for age and sex matched control, 50 to 69% stenosis in proximal LAD, 25 to 49% stenosis in the mid left circumflex artery, mild RCA disease, no significant lesion by FFR.  He was most recently seen by Dr. Royann Shivers in November 2024 at which time he was doing well.   More recently, patient was admitted in the hospital in December 2024 due to acute appendicitis requiring appendectomy.  CT also showed a 3.5 cm liver lesion in the left lobe which appears to be stable when compared to the previous CT scan in October 2023.  Postprocedure, he was doing well and completed a course of antibiotic prior to discharge.  Eliquis was resumed on the night of 09/09/2023.  Based on phone message from 11/03/2023, patient had appropriate ATP therapy for ventricular tachycardia with a rate of 200 bpm.  He did not require being shocked.  He was only minimally symptomatic.  Since  his appendectomy, he has lost significant weight.  He also developed hypotension, therefore carvedilol was cut back.  He had a few alcoholic drinks that weekend which may lead to additional hypovolemia.  He is spironolactone and Entresto was cut back at the carvedilol was increased back up to 6.25 mg twice a day.  Echocardiogram performed on 12/01/2023 showed EF of 30 to 35%, global hypokinesis, grade 1 DD, normal RV systolic function.  Patient presents today for follow-up.  Ever since carvedilol has been increased back to 6.25 mg twice a day, he has not had any palpitation or dizziness.  Blood pressure has normalized since cutting back on Entresto.  He is ready to resume semaglutide.  He has no lower extremity edema, orthopnea, or PND.  Overall, he is doing very well.  Given lack of recurrent palpitation or VT spell, I will hold off on adding amiodarone.  He can follow-up with Dr. Royann Shivers in May as previously scheduled.  ROS:   He denies chest pain, palpitations, dyspnea, pnd, orthopnea, n, v, dizziness, syncope, edema, weight gain, or early satiety. All other systems reviewed and are otherwise negative except as noted above.    Studies Reviewed: Marland Kitchen   EKG Interpretation Date/Time:  Tuesday December 02 2023 15:19:54 EDT Ventricular Rate:  73 PR Interval:  176 QRS Duration:  96 QT Interval:  354 QTC Calculation: 389 R Axis:   36  Text Interpretation: Atrial-paced rhythm Low voltage QRS Nonspecific ST and T wave abnormality When compared with  ECG of 05-Sep-2023 14:18, Electronic atrial pacemaker has replaced Sinus rhythm Minimal criteria for Anterior infarct are no longer Present Nonspecific T wave abnormality has replaced inverted T waves in Lateral leads QT has shortened Confirmed by Azalee Course [phone]) on 12/02/2023 3:47:24 PM    Cardiac Studies & Procedures   ______________________________________________________________________________________________     ECHOCARDIOGRAM  ECHOCARDIOGRAM  COMPLETE 12/01/2023  Narrative ECHOCARDIOGRAM REPORT    Patient Name:   NICOLAUS ANDEL Date of Exam: 12/01/2023 Medical Rec #:  [id number]        Height:       70.0 in Accession #:    [id number]       Weight:       245.2 lb Date of Birth:  Jan 28, 1961        BSA:          2.276 m Patient Age:    62 years         BP:           112/72 mmHg Patient Gender: M                HR:           73 bpm. Exam Location:  [address]  Procedure: 2D Echo and Intracardiac Opacification Agent (Both Spectral and Color Flow Doppler were utilized during procedure).  Indications:    I50.42 Chronic combined systolic (congestive) and diastolic (congestive) heart failure  History:        Patient has prior history of Echocardiogram examinations, most recent 01/22/2022. CAD, Defibrillator, Arrythmias:Atrial Fibrillation; Risk Factors:Sleep Apnea, Hypertension and Dyslipidemia. NICM. Aortic atherosclerosis. Ventricula tachycardia.  Sonographer:    Cathie Beams RCS Referring Phys: [id number] MIHAI CROITORU  IMPRESSIONS   1. Left ventricular ejection fraction, by estimation, is 30 to 35%. The left ventricle has moderately decreased function. The left ventricle demonstrates global hypokinesis. The left ventricular internal cavity size was mildly dilated. Left ventricular diastolic parameters are consistent with Grade I diastolic dysfunction (impaired relaxation). 2. Right ventricular systolic function is normal. The right ventricular size is normal. 3. The mitral valve is normal in structure. No evidence of mitral valve regurgitation. No evidence of mitral stenosis. 4. The aortic valve is normal in structure. Aortic valve regurgitation is not visualized. No aortic stenosis is present. 5. The inferior vena cava is normal in size with greater than 50% respiratory variability, suggesting right atrial pressure of 3 mmHg.  Comparison(s): No significant change from prior study. Prior images reviewed side by  side.  FINDINGS Left Ventricle: Left ventricular ejection fraction, by estimation, is 30 to 35%. The left ventricle has moderately decreased function. The left ventricle demonstrates global hypokinesis. The left ventricular internal cavity size was mildly dilated. There is no left ventricular hypertrophy. Left ventricular diastolic parameters are consistent with Grade I diastolic dysfunction (impaired relaxation).  Right Ventricle: The right ventricular size is normal. No increase in right ventricular wall thickness. Right ventricular systolic function is normal.  Left Atrium: Left atrial size was normal in size.  Right Atrium: Right atrial size was normal in size.  Pericardium: There is no evidence of pericardial effusion.  Mitral Valve: The mitral valve is normal in structure. No evidence of mitral valve regurgitation. No evidence of mitral valve stenosis.  Tricuspid Valve: The tricuspid valve is normal in structure. Tricuspid valve regurgitation is not demonstrated. No evidence of tricuspid stenosis.  Aortic Valve: The aortic valve is normal in structure. Aortic valve regurgitation is not visualized. No aortic stenosis is present.  Pulmonic Valve: The pulmonic valve was normal in structure. Pulmonic valve regurgitation is not visualized. No evidence of pulmonic stenosis.  Aorta: The aortic root is normal in size and structure.  Venous: The inferior vena cava is normal in size with greater than 50% respiratory variability, suggesting right atrial pressure of 3 mmHg.  IAS/Shunts: No atrial level shunt detected by color flow Doppler.  Additional Comments: A device lead is visualized in the right ventricle.   LEFT VENTRICLE PLAX 2D LVIDd:         5.90 cm   Diastology LVIDs:         4.80 cm   LV e' medial:    5.33 cm/s LV PW:         1.30 cm   LV E/e' medial:  11.0 LV IVS:        0.90 cm   LV e' lateral:   8.59 cm/s LVOT diam:     2.20 cm   LV E/e' lateral: 6.8 LV SV:          59 LV SV Index:   26 LVOT Area:     3.80 cm   RIGHT VENTRICLE RV S prime:     11.70 cm/s TAPSE (M-mode): 2.0 cm RVSP:           21.5 mmHg  LEFT ATRIUM             Index        RIGHT ATRIUM           Index LA diam:        4.40 cm 1.93 cm/m   RA Pressure: 3.00 mmHg LA Vol (A2C):   37.6 ml 16.50 ml/m  RA Area:     9.52 cm LA Vol (A4C):   25.8 ml 11.34 ml/m  RA Volume:   15.90 ml  6.99 ml/m LA Biplane Vol: 36.9 ml 16.21 ml/m AORTIC VALVE LVOT Vmax:   87.00 cm/s LVOT Vmean:  49.300 cm/s LVOT VTI:    0.156 m  AORTA Ao Root diam: 3.30 cm Ao Asc diam:  3.30 cm  MITRAL VALVE               TRICUSPID VALVE MV Area (PHT): 3.06 cm    TR Peak grad:   18.5 mmHg MV Decel Time: 248 msec    TR Vmax:        215.00 cm/s MV E velocity: 58.70 cm/s  Estimated RAP:  3.00 mmHg MV A velocity: 77.00 cm/s  RVSP:           21.5 mmHg MV E/A ratio:  0.76 SHUNTS Systemic VTI:  0.16 m Systemic Diam: 2.20 cm  Donato Schultz MD Electronically signed by Donato Schultz MD Signature Date/Time: 12/01/2023/2:06:09 PM    Final   TEE  ECHO TEE 09/01/2019  Narrative TRANSESOPHOGEAL ECHO REPORT    Patient Name:   ROYCE STEGMAN Date of Exam: 09/01/2019 Medical Rec #:  [id number]        Height:       71.0 in Accession #:    [id number]       Weight:       240.0 lb Date of Birth:  1961/03/23        BSA:          2.28 m Patient Age:    58 years         BP:           107/52 mmHg Patient Gender: M  HR:           115 bpm. Exam Location:  Inpatient   Procedure: Transesophageal Echo and Color Doppler  Indications:     I48.91* Unspeicified atrial fibrillation  History:         Patient has prior history of Echocardiogram examinations, most recent 08/24/2019. ETOH.  Sonographer:     Sheralyn Boatman RDCS Referring Phys:  [id number] [facility] Diagnosing Phys: Marca Ancona MD    PROCEDURE: The TEE was followed by a successful Cardioversion. Patients was monitored while under deep sedation  Anesthestetic sedation was provided intravenously by Anesthesiology: 230mg  of Propofol. The transesophogeal probe was passed through the esophogus of the patient. Imaged were obtained with the patient in a left lateral decubitus position. The patient developed no complications during the procedure.  IMPRESSIONS   1. Left ventricular ejection fraction, by visual estimation, is 25 to 30%. The left ventricle has severely decreased function. There is no left ventricular hypertrophy. 2. Mildly dilated left ventricular internal cavity size. 3. The left ventricle demonstrates global hypokinesis. 4. Global right ventricle has mildly reduced systolic function.The right ventricular size is normal. No increase in right ventricular wall thickness. 5. Left atrial size was moderately dilated. No LA appendage thrombus. 6. Right atrial size was normal. 7. The mitral valve is normal in structure. Trivial mitral valve regurgitation. No evidence of mitral stenosis. 8. The tricuspid valve is normal in structure. 9. The pulmonic valve was normal in structure. Pulmonic valve regurgitation is not visualized. 10. The aortic valve is tricuspid. Aortic valve regurgitation is not visualized. No evidence of aortic valve sclerosis or stenosis. 11. Normal caliber thoracic aorta with mild plaque in the descending thoracic aorta.  FINDINGS Left Ventricle: Left ventricular ejection fraction, by visual estimation, is 25 to 30%. The left ventricle has severely decreased function. The left ventricle demonstrates global hypokinesis. The left ventricular internal cavity size was mildly dilated left ventricle. There is no left ventricular hypertrophy.  Right Ventricle: The right ventricular size is normal. No increase in right ventricular wall thickness. Global RV systolic function is has mildly reduced systolic function.  Left Atrium: Left atrial size was moderately dilated.  Right Atrium: Right atrial size was normal in  size  Pericardium: There is no evidence of pericardial effusion.  Mitral Valve: The mitral valve is normal in structure. No evidence of mitral valve stenosis by observation. Trivial mitral valve regurgitation.  Tricuspid Valve: The tricuspid valve is normal in structure. Tricuspid valve regurgitation is trivial.  Aortic Valve: The aortic valve is tricuspid. Aortic valve regurgitation is not visualized. The aortic valve is structurally normal, with no evidence of sclerosis or stenosis.  Pulmonic Valve: The pulmonic valve was normal in structure. Pulmonic valve regurgitation is not visualized.  Aorta: The aortic root is normal in size and structure. Normal caliber thoracic aorta with mild plaque in the descending thoracic aorta.  Shunts: No atrial level shunt detected by color flow Doppler.   Marca Ancona MD Electronically signed by Marca Ancona MD Signature Date/Time: 09/22/2019/2:11:34 PM    Final    CT SCANS  CT CORONARY MORPH W/CTA COR W/SCORE 10/30/2022  Addendum 11/02/2022  5:00 PM ADDENDUM REPORT: 11/02/2022 16:58  EXAM: OVER-READ INTERPRETATION  CT CHEST  The following report is an over-read performed by radiologist Dr. Royal Piedra [facility] on 11/02/2022. This over-read does not include interpretation of cardiac or coronary anatomy or pathology. The coronary calcium score and cardiac CTA interpretation by the cardiologist is attached.  COMPARISON:  Chest CT 06/26/2022.  FINDINGS: Atherosclerotic calcifications are noted in the thoracic aorta. Within the visualized portions of the thorax there are no suspicious appearing pulmonary nodules or masses, there is no acute consolidative airspace disease, no pleural effusions, no pneumothorax and no lymphadenopathy. Visualized portions of the upper abdomen demonstrate diffuse low attenuation throughout the visualized hepatic parenchyma, indicative of a background of hepatic steatosis. There are no  aggressive appearing lytic or blastic lesions noted in the visualized portions of the skeleton.  IMPRESSION: 1.  Aortic Atherosclerosis (ICD10-I70.0). 2. Hepatic steatosis.   Electronically Signed By: Trudie Reed M.D. On: 11/02/2022 16:58  Narrative CLINICAL DATA:  Chest pain  EXAM: Cardiac CTA  MEDICATIONS: Sub lingual nitro. 4mg  and coreg 25 mg  TECHNIQUE: The patient was scanned on a Siemens Force 192 slice scanner. Gantry rotation speed was 250 msecs. Collimation was .6 mm. A 120 kV prospective scan was triggered in the ascending thoracic aorta at 140 HU's Full mA was used between 35% and 75% of the R-R interval. Average HR during the scan was 68 bpm. The 3D data set was interpreted on a dedicated work station using MPR, MIP and VRT modes. A total of 80 cc of contrast was used.  FINDINGS: Non-cardiac: See separate report from [facility]. No significant findings on limited lung and soft tissue windows.  Calcium Score: 3 vessel coronary calcium noted  LM 0  LAD 361  LCX 402  RCA 987  Total 1750 which is 98 th percentile for age/sex  Coronary Arteries: Right dominant with no anomalies  LM: Normal  LAD: 50-69% calcified plaque proximally  D1: 50-69% calcified plaque  D2: 1-24% calcified plaque  Circumflex: 25-49% calcified plaque in mid vessel  OM1: Normal  OM2: 25-49% calcified plaque ostial  AV groove: 25-49% calcified plaque  RCA: 1-24% calcified plaque proximally 25-49% calcified plaque mid vessel  PDA: Normal  PLA: Normal  IMPRESSION: 1. 3 vessel calcium score 1750 which is 98 th percentile for age / sex  2.  Normal ascending thoracic aorta 3.1 cm  3.  Single AICD lead seen in RV  4. CAD RADS 3 possibly obstructive CAD see description above study sent for [facility]  Charlton Haws  Electronically Signed: By: Charlton Haws M.D. On: 10/30/2022 15:39      ______________________________________________________________________________________________      Risk Assessment/Calculations:    CHA2DS2-VASc Score = 3   This indicates a 3.2% annual risk of stroke. The patient's score is based upon: CHF History: 1 HTN History: 1 Diabetes History: 0 Stroke History: 0 Vascular Disease History: 1 Age Score: 0 Gender Score: 0        STOP-Bang Score:         Physical Exam:   VS:  BP 120/80   Pulse 73   Ht 5\' 11"  (1.803 m)   Wt 250 lb (113.4 kg)   SpO2 94%   BMI 34.87 kg/m    Wt Readings from Last 3 Encounters:  12/02/23 250 lb (113.4 kg)  11/13/23 245 lb 3.2 oz (111.2 kg)  09/08/23 268 lb 15.4 oz (122 kg)    GEN: Well nourished, well developed in no acute distress NECK: No JVD; No carotid bruits CARDIAC: RRR, no murmurs, rubs, gallops RESPIRATORY:  Clear to auscultation without rales, wheezing or rhonchi  ABDOMEN: Soft, non-tender, non-distended EXTREMITIES:  No edema; No deformity   ASSESSMENT AND PLAN: .     Ventricular Tachycardia (VT) Recurrent VT treated with ATP.  Since then,  carvedilol has increased back up to 6.25 mg twice a day.  He had increased alcohol intake on the weekend when he had VT.  He has not had any recurrent palpitation or dizzy spell since.  Current focus on rate control.  - Continue carvedilol, spironolactone, Entresto, and Farxiga. - Consider amiodarone if VT recurs  Heart Failure with Reduced Ejection Fraction (HFrEF) Chronic HFrEF with improved ejection fraction to 30-35%. Regimen includes beta-blockers, SGLT2 inhibitors, ARNI, and m spironolactone. Blood pressure and heart rate stable. - Continue carvedilol, spironolactone, Entresto, and Farxiga. - Monitor blood pressure and adjust medications as needed.  History of ICD: His device was upgraded to Abbott dual-chamber ICD by Dr. Lalla Brothers  Persistent atrial fibrillation: No recent recurrence.  On Eliquis  CAD: No chest pain.  History of moderate  nonobstructive coronary artery disease seen on coronary CT       Dispo: Follow-up with Dr. Royann Shivers in May as previously scheduled  Signed, Azalee Course, [place]

## 2023-12-02 NOTE — Telephone Encounter (Signed)
 Clearance has been sent to requesting office to fax#[phone]

## 2023-12-02 NOTE — Progress Notes (Signed)
 Thanks, I explained the echo to him and his wife during the visit. The preop clearance letter sent to the eye doctor's office. Since he hasn't had any more palpitation or dizziness after increased coreg to suggest recurrent VT, I did not try to add amiodarone.

## 2023-12-02 NOTE — Patient Instructions (Signed)
 Medication Instructions:  NO CHANGES *If you need a refill on your cardiac medications before your next appointment, please call your pharmacy*   Lab Work: NO LABS If you have labs (blood work) drawn today and your tests are completely normal, you will receive your results only by: MyChart Message (if you have MyChart) OR A paper copy in the mail If you have any lab test that is abnormal or we need to change your treatment, we will call you to review the results.   Testing/Procedures: NO TESTING   Follow-Up: At [facility], you and your health needs are our priority.  As part of our continuing mission to provide you with exceptional heart care, we have created designated Provider Care Teams.  These Care Teams include your primary Cardiologist (physician) and Advanced Practice Providers (APPs -  Physician Assistants and Nurse Practitioners) who all work together to provide you with the care you need, when you need it.  Your next appointment:   KEEP FOLLOW UP MAY 2025   Provider:   Thurmon Fair, MD    Other Instructions

## 2023-12-02 NOTE — Telephone Encounter (Signed)
 Please forward to the requesting provider, the fax number is missing a digit

## 2023-12-02 NOTE — Telephone Encounter (Signed)
   Patient Name: John [name]  DOB: 1960/11/21 MRN: [id number]  Primary Cardiologist: Thurmon Fair, MD  Chart reviewed as part of pre-operative protocol coverage. Given past medical history and time since last visit, based on ACC/AHA guidelines, John [name] is at acceptable risk for the planned procedure without further cardiovascular testing.   The patient was advised that if he develops new symptoms prior to surgery to contact our office to arrange for a follow-up visit, and he verbalized understanding.  I will route this recommendation to the requesting party via Epic fax function and remove from pre-op pool.  Please call with questions.  [place], [place] 12/02/2023, 4:15 PM

## 2023-12-04 ENCOUNTER — Ambulatory Visit: Payer: Self-pay | Admitting: Family Medicine

## 2023-12-04 NOTE — Progress Notes (Deleted)
 Rubin Payor, PhD, LAT, ATC acting as a scribe for [name], MD.  John [name] is a 63 y.o. male who presents to [facility] at [facility] today for exacerbation of his LBP. Pt was last seen by Dr. Denyse Amass on 10/26/20.  Today, pt reports ***  Radiating pain: LE numbness/tingling: LE weakness: Aggravates: Treatments tried:  Dx imaging: 01/18/21 L-spine MRI 10/24/20 L-spine MRI             07/25/20 L-spine XR  Pertinent review of systems: ***  Relevant historical information: ***   Exam:  There were no vitals taken for this visit. General: Well Developed, well nourished, and in no acute distress.   MSK: ***    Lab and Radiology Results No results found. However, due to the size of the patient record, not all encounters were searched. Please check Results Review for a complete set of results. ECHOCARDIOGRAM COMPLETE Result Date: 12/01/2023    ECHOCARDIOGRAM REPORT   Patient Name:   John [name] Date of Exam: 12/01/2023 Medical Rec #:  [id number]        Height:       70.0 in Accession #:    [id number]       Weight:       245.2 lb Date of Birth:  1961/01/22        BSA:          2.276 m Patient Age:    62 years         BP:           112/72 mmHg Patient Gender: M                HR:           73 bpm. Exam Location:  [address] Procedure: 2D Echo and Intracardiac Opacification Agent (Both Spectral and Color            Flow Doppler were utilized during procedure). Indications:    I50.42 Chronic combined systolic (congestive) and diastolic                 (congestive) heart failure  History:        Patient has prior history of Echocardiogram examinations, most                 recent 01/22/2022. CAD, Defibrillator, Arrythmias:Atrial                 Fibrillation; Risk Factors:Sleep Apnea, Hypertension and                 Dyslipidemia. NICM. Aortic atherosclerosis. Ventricula                 tachycardia.  Sonographer:    Cathie Beams RCS Referring Phys: [phone] MIHAI  CROITORU IMPRESSIONS  1. Left ventricular ejection fraction, by estimation, is 30 to 35%. The left ventricle has moderately decreased function. The left ventricle demonstrates global hypokinesis. The left ventricular internal cavity size was mildly dilated. Left ventricular diastolic parameters are consistent with Grade I diastolic dysfunction (impaired relaxation).  2. Right ventricular systolic function is normal. The right ventricular size is normal.  3. The mitral valve is normal in structure. No evidence of mitral valve regurgitation. No evidence of mitral stenosis.  4. The aortic valve is normal in structure. Aortic valve regurgitation is not visualized. No aortic stenosis is present.  5. The inferior vena cava is normal in size with greater than 50% respiratory variability, suggesting right atrial  pressure of 3 mmHg. Comparison(s): No significant change from prior study. Prior images reviewed side by side. FINDINGS  Left Ventricle: Left ventricular ejection fraction, by estimation, is 30 to 35%. The left ventricle has moderately decreased function. The left ventricle demonstrates global hypokinesis. The left ventricular internal cavity size was mildly dilated. There is no left ventricular hypertrophy. Left ventricular diastolic parameters are consistent with Grade I diastolic dysfunction (impaired relaxation). Right Ventricle: The right ventricular size is normal. No increase in right ventricular wall thickness. Right ventricular systolic function is normal. Left Atrium: Left atrial size was normal in size. Right Atrium: Right atrial size was normal in size. Pericardium: There is no evidence of pericardial effusion. Mitral Valve: The mitral valve is normal in structure. No evidence of mitral valve regurgitation. No evidence of mitral valve stenosis. Tricuspid Valve: The tricuspid valve is normal in structure. Tricuspid valve regurgitation is not demonstrated. No evidence of tricuspid stenosis. Aortic Valve:  The aortic valve is normal in structure. Aortic valve regurgitation is not visualized. No aortic stenosis is present. Pulmonic Valve: The pulmonic valve was normal in structure. Pulmonic valve regurgitation is not visualized. No evidence of pulmonic stenosis. Aorta: The aortic root is normal in size and structure. Venous: The inferior vena cava is normal in size with greater than 50% respiratory variability, suggesting right atrial pressure of 3 mmHg. IAS/Shunts: No atrial level shunt detected by color flow Doppler. Additional Comments: A device lead is visualized in the right ventricle.  LEFT VENTRICLE PLAX 2D LVIDd:         5.90 cm   Diastology LVIDs:         4.80 cm   LV e' medial:    5.33 cm/s LV PW:         1.30 cm   LV E/e' medial:  11.0 LV IVS:        0.90 cm   LV e' lateral:   8.59 cm/s LVOT diam:     2.20 cm   LV E/e' lateral: 6.8 LV SV:         59 LV SV Index:   26 LVOT Area:     3.80 cm  RIGHT VENTRICLE RV S prime:     11.70 cm/s TAPSE (M-mode): 2.0 cm RVSP:           21.5 mmHg LEFT ATRIUM             Index        RIGHT ATRIUM           Index LA diam:        4.40 cm 1.93 cm/m   RA Pressure: 3.00 mmHg LA Vol (A2C):   37.6 ml 16.50 ml/m  RA Area:     9.52 cm LA Vol (A4C):   25.8 ml 11.34 ml/m  RA Volume:   15.90 ml  6.99 ml/m LA Biplane Vol: 36.9 ml 16.21 ml/m  AORTIC VALVE LVOT Vmax:   87.00 cm/s LVOT Vmean:  49.300 cm/s LVOT VTI:    0.156 m  AORTA Ao Root diam: 3.30 cm Ao Asc diam:  3.30 cm MITRAL VALVE               TRICUSPID VALVE MV Area (PHT): 3.06 cm    TR Peak grad:   18.5 mmHg MV Decel Time: 248 msec    TR Vmax:        215.00 cm/s MV E velocity: 58.70 cm/s  Estimated RAP:  3.00 mmHg MV A velocity: 77.00  cm/s  RVSP:           21.5 mmHg MV E/A ratio:  0.76                            SHUNTS                            Systemic VTI:  0.16 m                            Systemic Diam: 2.20 cm Donato Schultz MD Electronically signed by Donato Schultz MD Signature Date/Time: 12/01/2023/2:06:09 PM    Final         Assessment and Plan: 63 y.o. male with ***   PDMP not reviewed this encounter. No orders of the defined types were placed in this encounter.  No orders of the defined types were placed in this encounter.    Discussed warning signs or symptoms. Please see discharge instructions. Patient expresses understanding.   ***

## 2023-12-10 NOTE — Progress Notes (Deleted)
   Rubin Payor, PhD, LAT, ATC acting as a scribe for [name], MD.  Ronel Rodeheaver is a 63 y.o. male who presents to [facility] at [facility] today for exacerbation of his LBP. Pt was last seen by Dr. Denyse Amass on 10/26/20.  Today, pt reports ***  Radiating pain: LE numbness/tingling: LE weakness: Aggravates: Treatments tried:  Dx imaging: 01/18/21 L-spine MRI 10/24/20 L-spine MRI             07/25/20 L-spine XR  Pertinent review of systems: ***  Relevant historical information: ***   Exam:  There were no vitals taken for this visit. General: Well Developed, well nourished, and in no acute distress.   MSK: ***    Lab and Radiology Results No results found. However, due to the size of the patient record, not all encounters were searched. Please check Results Review for a complete set of results. No results found.      Assessment and Plan: 63 y.o. male with ***   PDMP not reviewed this encounter. No orders of the defined types were placed in this encounter.  No orders of the defined types were placed in this encounter.    Discussed warning signs or symptoms. Please see discharge instructions. Patient expresses understanding.   ***

## 2023-12-11 ENCOUNTER — Ambulatory Visit: Admitting: Family Medicine

## 2023-12-11 ENCOUNTER — Other Ambulatory Visit: Payer: Self-pay | Admitting: Podiatry

## 2023-12-16 NOTE — Progress Notes (Signed)
 Remote ICD transmission.

## 2023-12-16 NOTE — Addendum Note (Signed)
 Addended by: Elease Etienne A on: 12/16/2023 04:04 PM   Modules accepted: Orders

## 2023-12-17 NOTE — Progress Notes (Deleted)
   Rubin Payor, PhD, LAT, ATC acting as a scribe for [name], MD.  John [name] is a 63 y.o. male who presents to [facility] at [facility] today for exacerbation of his LBP. Pt was last seen by Dr. Denyse Amass on 10/26/20.  Today, pt reports ***  Radiating pain: LE numbness/tingling: LE weakness: Aggravates: Treatments tried:  Dx imaging: 01/18/21 L-spine MRI 10/24/20 L-spine MRI             07/25/20 L-spine XR  Pertinent review of systems: ***  Relevant historical information: ***   Exam:  There were no vitals taken for this visit. General: Well Developed, well nourished, and in no acute distress.   MSK: ***    Lab and Radiology Results No results found. However, due to the size of the patient record, not all encounters were searched. Please check Results Review for a complete set of results. No results found.      Assessment and Plan: 63 y.o. male with ***   PDMP not reviewed this encounter. No orders of the defined types were placed in this encounter.  No orders of the defined types were placed in this encounter.    Discussed warning signs or symptoms. Please see discharge instructions. Patient expresses understanding.   ***

## 2023-12-18 ENCOUNTER — Ambulatory Visit: Admitting: Family Medicine

## 2023-12-23 NOTE — Progress Notes (Deleted)
   Rubin Payor, PhD, LAT, ATC acting as a scribe for [name], MD.  Ronel Rodeheaver is a 63 y.o. male who presents to [facility] at [facility] today for exacerbation of his LBP. Pt was last seen by Dr. Denyse Amass on 10/26/20.  Today, pt reports ***  Radiating pain: LE numbness/tingling: LE weakness: Aggravates: Treatments tried:  Dx imaging: 01/18/21 L-spine MRI 10/24/20 L-spine MRI             07/25/20 L-spine XR  Pertinent review of systems: ***  Relevant historical information: ***   Exam:  There were no vitals taken for this visit. General: Well Developed, well nourished, and in no acute distress.   MSK: ***    Lab and Radiology Results No results found. However, due to the size of the patient record, not all encounters were searched. Please check Results Review for a complete set of results. No results found.      Assessment and Plan: 63 y.o. male with ***   PDMP not reviewed this encounter. No orders of the defined types were placed in this encounter.  No orders of the defined types were placed in this encounter.    Discussed warning signs or symptoms. Please see discharge instructions. Patient expresses understanding.   ***

## 2023-12-24 ENCOUNTER — Ambulatory Visit: Admitting: Family Medicine

## 2024-01-01 NOTE — Progress Notes (Deleted)
   Rubin Payor, PhD, LAT, ATC acting as a scribe for [name], MD.  John [name] is a 63 y.o. male who presents to [facility] at [facility] today for exacerbation of his LBP. Pt was last seen by Dr. Denyse Amass on 10/26/20.  Today, pt reports ***  Radiating pain: LE numbness/tingling: LE weakness: Aggravates: Treatments tried:  Dx imaging: 01/18/21 L-spine MRI 10/24/20 L-spine MRI             07/25/20 L-spine XR  Pertinent review of systems: ***  Relevant historical information: ***   Exam:  There were no vitals taken for this visit. General: Well Developed, well nourished, and in no acute distress.   MSK: ***    Lab and Radiology Results No results found. However, due to the size of the patient record, not all encounters were searched. Please check Results Review for a complete set of results. No results found.      Assessment and Plan: 63 y.o. male with ***   PDMP not reviewed this encounter. No orders of the defined types were placed in this encounter.  No orders of the defined types were placed in this encounter.    Discussed warning signs or symptoms. Please see discharge instructions. Patient expresses understanding.   ***

## 2024-01-06 ENCOUNTER — Other Ambulatory Visit: Payer: Self-pay

## 2024-01-06 MED ORDER — CARVEDILOL 6.25 MG PO TABS: 6.2500 mg | ORAL_TABLET | Freq: Two times a day (BID) | ORAL | 3 refills | Status: AC

## 2024-01-08 ENCOUNTER — Ambulatory Visit: Admitting: Family Medicine

## 2024-01-19 ENCOUNTER — Encounter: Payer: Self-pay | Admitting: Cardiovascular Disease

## 2024-01-19 ENCOUNTER — Ambulatory Visit: Payer: Self-pay | Attending: Cardiovascular Disease | Admitting: Cardiovascular Disease

## 2024-01-19 VITALS — BP 90/50 | HR 86 | Ht 71.0 in | Wt 248.0 lb

## 2024-01-19 DIAGNOSIS — R7303 Prediabetes: Secondary | ICD-10-CM

## 2024-01-19 DIAGNOSIS — E7800 Pure hypercholesterolemia, unspecified: Secondary | ICD-10-CM | POA: Diagnosis not present

## 2024-01-19 DIAGNOSIS — I480 Paroxysmal atrial fibrillation: Secondary | ICD-10-CM

## 2024-01-19 DIAGNOSIS — G4733 Obstructive sleep apnea (adult) (pediatric): Secondary | ICD-10-CM

## 2024-01-19 DIAGNOSIS — I5042 Chronic combined systolic (congestive) and diastolic (congestive) heart failure: Secondary | ICD-10-CM

## 2024-01-19 DIAGNOSIS — Z79899 Other long term (current) drug therapy: Secondary | ICD-10-CM

## 2024-01-19 DIAGNOSIS — I472 Ventricular tachycardia, unspecified: Secondary | ICD-10-CM

## 2024-01-19 DIAGNOSIS — E669 Obesity, unspecified: Secondary | ICD-10-CM

## 2024-01-19 DIAGNOSIS — E782 Mixed hyperlipidemia: Secondary | ICD-10-CM

## 2024-01-19 DIAGNOSIS — Z95810 Presence of automatic (implantable) cardiac defibrillator: Secondary | ICD-10-CM

## 2024-01-19 DIAGNOSIS — D6869 Other thrombophilia: Secondary | ICD-10-CM

## 2024-01-19 DIAGNOSIS — I2510 Atherosclerotic heart disease of native coronary artery without angina pectoris: Secondary | ICD-10-CM

## 2024-01-19 NOTE — Patient Instructions (Addendum)
 Medication Instructions:  No changes *If you need a refill on your cardiac medications before your next appointment, please call your pharmacy*  Lab Work: CMP, Lipid Panel, AIC, BNP, Mag- Fasting lab work- nothing to eat after midnight- water  is ok If you have labs (blood work) drawn today and your tests are completely normal, you will receive your results only by: MyChart Message (if you have MyChart) OR A paper copy in the mail If you have any lab test that is abnormal or we need to change your treatment, we will call you to review the results.  Follow-Up: At [facility], you and your health needs are our priority.  As part of our continuing mission to provide you with exceptional heart care, our providers are all part of one team.  This team includes your primary Cardiologist (physician) and Advanced Practice Providers or APPs (Physician Assistants and Nurse Practitioners) who all work together to provide you with the care you need, when you need it.  Your next appointment:   1 year(s)  Provider:   Luana Rumple, MD    We recommend signing up for the patient portal called "MyChart".  Sign up information is provided on this After Visit Summary.  MyChart is used to connect with patients for Virtual Visits (Telemedicine).  Patients are able to view lab/test results, encounter notes, upcoming appointments, etc.  Non-urgent messages can be sent to your provider as well.   To learn more about what you can do with MyChart, go to [email or web address].

## 2024-01-19 NOTE — Progress Notes (Signed)
 Cardiology Office Note:    Date:  01/24/2024   ID:  John [name], DOB Jun 28, 1961, MRN [id number]  PCP:  Almira Jaeger, MD  Cardiologist:  Cookie Dense (CHF)  Referring MD: Almira Jaeger, MD   No chief complaint on file.    History of Present Illness:    John [name] is a 63 y.o. male with a hx of severe nonischemic cardiomyopathy (with left ventricular ejection fraction as low as 15-20%, most recently estimated to be 30-35 % by an echo 12/01/2023) , paroxysmal atrial fibrillation, polymorphic ventricular tachycardia, aortic atherosclerosis, moderate nonobstructive CAD with severe plaque burden (coronary CTA 10/30/2022 calcium  score 1750, 98th percentile; 50-69% stenoses in proximal LAD, first diagonal, 25-49% stenosis in mid LCx, OM 2, AV groove artery, mid RCA; none of the lesions significant by FFR), obstructive sleep apnea on CPAP and severe obesity.  He had acute appendicitis in December 2024.  He has lost a lot of weight since then and we had to reduce some of his medications due to hypotension.  On 11/03/2023 he received appropriate ATP therapy for ventricular tachycardia with a rate of 200 bpm, without high-voltage shock.  He was only mildly symptomatic and did not have syncope.  There have been no further episodes of ventricular tachycardia recorded by his device since then.  A follow-up echocardiogram shows that his EF is unchanged at 30-35%.  No clear trigger for the arrhythmia was detected.  His wife mentioned that he had a few drinks that weekend.  His ongoing consumption of alcohol  is clearly a motive of contention between them.  He tends to minimize how much he drinks and she thinks he is not being forthcoming.  Otherwise he is feeling well and has NYHA functional class I-II, no clinical signs of hypervolemia.  He denies orthopnea, PND, lower extremity edema.  He remains quite sedentary.  Compliance with CPAP is poor.  He has not had falls or serious bleeding  with Eliquis .  Defibrillator interrogation shows normal device function.  He is programmed with a relatively fast lower rate limit of 70 bpm to try to prevent polymorphic, pause mediated VT.  Presenting rhythm is atrial paced, ventricular sensed and he has about 80% atrial pacing and only 6% ventricular pacing.  Estimated gentle longevity is about 7 years.  Lead parameters are stable in normal range.  OptiVol has been in range over the last few months.    He had an appropriate defibrillator shock for polymorphic ventricular tachycardia 226 bpm on 07/21/2022.  Several other brief episodes of nonsustained polymorphic tachycardia were seen, all of them seemed to have a pause mediated onset.    Coronary CT angiography 10/30/2022 did not show evidence of significant coronary stenoses, although he did have moderate stenoses in the proximal LAD and first diagonal arteries (50 to 69%) and mild stenoses in the mid circumflex, OM 2, mid RCA (25-49%) and the calcium  score was markedly elevated at 1750 (98th percentile for age and gender).  None of the coronary lesions were significant by FFR.  Aortic atherosclerosis and a normal caliber aorta was incidentally noted.  He underwent successful elective cardioversion for persistent atrial fibrillation 10/07/2022.  He underwent upgrade of his ICD to a dual-chamber device to allow atrial pacing 11/11/2022 (Dr. Marven Slimmer, [facility], new atrial lead LPA 1231, old RV ICD lead Durata 7122Q).  Since adding the atrial lead he has not had any significant polymorphic VT.  He has a history of a lumbar vertebroplasty and right  total hip repair by Dr. Charol Copas and Dr. Vaughn Georges respectively.  No cardiac issues during those surgeries.    Past Medical History:  Diagnosis Date   AICD (automatic cardioverter/defibrillator) present    [employer]   Anxiety    Ativan    Chronic combined systolic and diastolic CHF, NYHA class 3 (HCC) 10/2016   Nonischemic cardiomyopathy. EF 20-25%.    Chronic left hip pain    Depression    history of   Dysrhythmia    A-FIB   GI bleed 03/2016   Headache    Hypertensive heart disease with combined systolic and diastolic congestive heart failure (HCC) 10/2016   Nonischemic cardiomyopathy (HCC) 10/2016   Echo with EF 20-25%. Cardiac catheterization with no CAD. LVEDP was 41 mmHg, PCWP 36 mmHg   Osteoarthritis    right shoulder   Right hand fracture    Seasonal allergies    Sleep apnea    wears a CPAP   Wears glasses     Past Surgical History:  Procedure Laterality Date   CARDIAC CATHETERIZATION     CARDIOVERSION N/A 09/01/2019   Procedure: CARDIOVERSION;  Surgeon: Darlis Eisenmenger, MD;  Location: [facility] ENDOSCOPY;  Service: Cardiovascular;  Laterality: N/A;   CARDIOVERSION N/A 10/07/2022   Procedure: CARDIOVERSION;  Surgeon: Jerryl Morin, DO;  Location: [facility] ENDOSCOPY;  Service: Cardiovascular;  Laterality: N/A;   ESOPHAGOGASTRODUODENOSCOPY (EGD) WITH PROPOFOL  N/A 03/20/2016   Procedure: ESOPHAGOGASTRODUODENOSCOPY (EGD) WITH PROPOFOL ;  Surgeon: Baldo Bonds, MD;  Location: [facility] ENDOSCOPY;  Service: Endoscopy;  Laterality: N/A;   ICD IMPLANT N/A 05/06/2018   Procedure: ICD IMPLANT;  Surgeon: Luana Rumple, MD;  Location: [facility] INVASIVE CV LAB;  Service: Cardiovascular;  Laterality: N/A;   IRRIGATION AND DEBRIDEMENT SHOULDER Left 11/11/2019   Procedure: LEFT SHOULDER IRRIGATION AND DEBRIDEMENT WITH POLY EXCHANGE;  Surgeon: Sammye Cristal, MD;  Location: [facility] ORS;  Service: Orthopedics;  Laterality: Left;   KYPHOPLASTY N/A 02/22/2021   Procedure: KYPHOPLASTY T12;  Surgeon: Mort Ards, MD;  Location: [facility] OR;  Service: Orthopedics;  Laterality: N/A;  90 mins   LAPAROSCOPIC APPENDECTOMY N/A 09/06/2023   Procedure: APPENDECTOMY LAPAROSCOPIC;  Surgeon: Kinsinger, Alphonso Aschoff, MD;  Location: [facility] OR;  Service: General;  Laterality: N/A;   LEAD REVISION/REPAIR N/A 11/11/2022   Procedure: LEAD atrial lead insertion;  Surgeon: Boyce Byes, MD;   Location: [facility] INVASIVE CV LAB;  Service: Cardiovascular;  Laterality: N/A;   ORIF WRIST FRACTURE Left 02/04/2022   Procedure: OPEN REDUCTION INTERNAL FIXATION (ORIF) WRIST FRACTURE;  Surgeon: Arvil Birks, MD;  Location: [facility] OR;  Service: Orthopedics;  Laterality: Left;  with IV sedation   PPM GENERATOR CHANGEOUT N/A 11/11/2022   Procedure: PPM GENERATOR CHANGEOUT;  Surgeon: Boyce Byes, MD;  Location: [facility] INVASIVE CV LAB;  Service: Cardiovascular;  Laterality: N/A;   RIGHT/LEFT HEART CATH AND CORONARY ANGIOGRAPHY N/A 10/21/2016   Procedure: Right/Left Heart Cath and Coronary Angiography;  Surgeon: Avanell Leigh, MD;  Location: [facility] INVASIVE CV LAB;  Service: Cardiovascular: Angiographically normal coronary arteries. PCWP 33-36 mmHg, LVEDP 41 mmHg. PA pressure 60/35, mean 46 mmHg.  Cardiac output/cardiac index-3.93 /1.76 Albertina Hugger), 3.49/1.57 (thermodilution)   TEE WITHOUT CARDIOVERSION N/A 09/01/2019   Procedure: TRANSESOPHAGEAL ECHOCARDIOGRAM (TEE);  Surgeon: Darlis Eisenmenger, MD;  Location: [facility] ENDOSCOPY;  Service: Cardiovascular;  Laterality: N/A;   TOTAL HIP ARTHROPLASTY Left 03/27/2015   Procedure: LEFT TOTAL HIP ARTHROPLASTY ANTERIOR APPROACH;  Surgeon: Adonica Hoose, MD;  Location: [facility] OR;  Service: Orthopedics;  Laterality: Left;   TOTAL  HIP ARTHROPLASTY Right 05/03/2021   Procedure: TOTAL HIP ARTHROPLASTY ANTERIOR APPROACH;  Surgeon: Adonica Hoose, MD;  Location: [facility] ORS;  Service: Orthopedics;  Laterality: Right;   TOTAL SHOULDER ARTHROPLASTY Right 11/24/2015   Procedure: RIGHT TOTAL SHOULDER ARTHROPLASTY;  Surgeon: Winston Hawking, MD;  Location: [facility] OR;  Service: Orthopedics;  Laterality: Right;   TOTAL SHOULDER ARTHROPLASTY Left 10/19/2019   Procedure: REVERSE TOTAL SHOULDER ARTHROPLASTY;  Surgeon: Sammye Cristal, MD;  Location: [facility] ORS;  Service: Orthopedics;  Laterality: Left;   TRANSTHORACIC ECHOCARDIOGRAM  10/2016   EF 20-25%. Diffuse hypokinesis but akinesis of the entire inferoseptal wall  and apical wall. Moderate biatrial enlargement. PA pressure estimated 64 mmHg.   WISDOM TOOTH EXTRACTION      Current Medications: Current Meds  Medication Sig   acetaminophen  (TYLENOL ) 500 MG tablet Take 1,000 mg by mouth daily.   apixaban  (ELIQUIS ) 5 MG TABS tablet Take 1 tablet (5 mg total) by mouth 2 (two) times daily.   bismuth subsalicylate (PEPTO BISMOL) 262 MG/15ML suspension Take 30 mLs by mouth every 6 (six) hours as needed for diarrhea or loose stools.   carvedilol  (COREG ) 6.25 MG tablet Take 1 tablet (6.25 mg total) by mouth 2 (two) times daily with a meal.   famotidine -calcium  carbonate-magnesium hydroxide (PEPCID  COMPLETE) 10-800-165 MG chewable tablet Chew 1 tablet by mouth 2 (two) times daily as needed.   FARXIGA  10 MG TABS tablet TAKE 1 TABLET (10 MG TOTAL) BY MOUTH DAILY. NEEDS FOLLOW UP APPOINTMENT FOR MORE REFILLS   gatifloxacin (ZYMAXID) 0.5 % SOLN Place 1 drop into the left eye 4 (four) times daily.   ipratropium-albuterol  (DUONEB) 0.5-2.5 (3) MG/3ML SOLN Take 3 mLs by nebulization every 4 (four) hours as needed.   ketorolac  (ACULAR ) 0.5 % ophthalmic solution Place 1 drop into the left eye 4 (four) times daily.   meloxicam  (MOBIC ) 15 MG tablet TAKE 1 TABLET (15 MG TOTAL) BY MOUTH DAILY.   Multiple Vitamin (MULTIVITAMIN WITH MINERALS) TABS tablet Take 1 tablet by mouth in the morning.   prednisoLONE acetate (PRED FORTE) 1 % ophthalmic suspension Place 1 drop into the left eye 4 (four) times daily.   rosuvastatin  (CRESTOR ) 20 MG tablet TAKE 1 TABLET (20 MG TOTAL) BY MOUTH DAILY. NEEDS FOLLOW UP APPOINTMENT FOR MORE REFILLS   sacubitril -valsartan  (ENTRESTO ) 97-103 MG Take 1/2 tablet twice a day   Semaglutide -Weight Management 0.5 MG/0.5ML SOAJ Inject 0.5 mg into the skin once a week.   sertraline  (ZOLOFT ) 100 MG tablet TAKE 1 TABLET BY MOUTH EVERY DAY   sildenafil  (VIAGRA ) 100 MG tablet TAKE 1 TABLET (100 MG TOTAL) BY MOUTH AT BEDTIME AS NEEDED FOR ERECTILE DYSFUNCTION.    spironolactone  (ALDACTONE ) 25 MG tablet Take 1 tablet (25 mg total) by mouth daily.   torsemide  (DEMADEX ) 20 MG tablet Take 2 tablets (40 mg total) by mouth daily.   traZODone  (DESYREL ) 50 MG tablet Take 1 tablet (50 mg total) by mouth at bedtime.     Allergies:   Chlorhexidine , Cymbalta [duloxetine hcl], and Hydrocodone    Family History: The patient's family history includes Congenital heart disease in his sister; Dementia in his mother; Healthy in his brother, brother, brother, and sister; Lung cancer in his father; Prostate cancer in his brother.  His son also had a "hole in his heart" there was repaired surgically.  ROS:   Please see the history of present illness.    All other systems are reviewed and are negative  EKGs/Labs/Other Studies Reviewed:    The following  studies were reviewed today:   ECHO 12/01/2023   1. Left ventricular ejection fraction, by estimation, is 30 to 35%. The  left ventricle has moderately decreased function. The left ventricle  demonstrates global hypokinesis. The left ventricular internal cavity size  was mildly dilated. Left ventricular  diastolic parameters are consistent with Grade I diastolic dysfunction  (impaired relaxation).   2. Right ventricular systolic function is normal. The right ventricular  size is normal.   3. The mitral valve is normal in structure. No evidence of mitral valve  regurgitation. No evidence of mitral stenosis.   4. The aortic valve is normal in structure. Aortic valve regurgitation is  not visualized. No aortic stenosis is present.   5. The inferior vena cava is normal in size with greater than 50%  respiratory variability, suggesting right atrial pressure of 3 mmHg.   Comparison(s): No significant change from prior study. Prior images  reviewed side by side.   EKG:  EKG is not ordered today.  The intracardiac electrogram shows atrial paced, ventricular sensed rhythm.  The underlying sinus rate is about 50 bpm.  The  most recent tracing from 03/07/2023 was personally reviewed and shows sinus rhythm, generalized low voltage QRS, nonspecific ST segment depression across the anterior precordium  EKG Interpretation Date/Time:    Ventricular Rate:    PR Interval:    QRS Duration:    QT Interval:    QTC Calculation:   R Axis:      Text Interpretation:            Recent Labs: 08/22/2023: Pro B Natriuretic peptide (BNP) 286.0; TSH 1.030 09/05/2023: ALT 23 09/06/2023: Magnesium 2.3 09/09/2023: BUN 31; Creatinine, Ser 1.85; Hemoglobin 10.2; Platelets 227; Potassium 3.3; Sodium 132  Recent Lipid Panel    Component Value Date/Time   CHOL 140 08/22/2023 1227   CHOL 111 06/09/2018 1137   TRIG 148.0 08/22/2023 1227   HDL 42.90 08/22/2023 1227   HDL 53 06/09/2018 1137   CHOLHDL 3 08/22/2023 1227   VLDL 29.6 08/22/2023 1227   LDLCALC 67 08/22/2023 1227   LDLCALC 71 08/24/2020 1004   LDLDIRECT 85.0 03/06/2023 1538    Physical Exam:    VS:  BP (!) 90/50 (BP Location: Right Arm, Patient Position: Sitting, Cuff Size: Large)   Pulse 86   Ht 5\' 11"  (1.803 m)   Wt 112.5 kg   SpO2 96%   BMI 34.59 kg/m     Wt Readings from Last 3 Encounters:  01/19/24 112.5 kg  12/02/23 113.4 kg  11/13/23 111.2 kg     General: Alert, oriented x3, no distress, severely obese.  Well-healed left subclavian ICD site Head: no evidence of trauma, PERRL, EOMI, no exophtalmos or lid lag, no myxedema, no xanthelasma; normal ears, nose and oropharynx Neck: normal jugular venous pulsations and no hepatojugular reflux; brisk carotid pulses without delay and no carotid bruits Chest: clear to auscultation, no signs of consolidation by percussion or palpation, normal fremitus, symmetrical and full respiratory excursions Cardiovascular: normal position and quality of the apical impulse, regular rhythm, normal first and second heart sounds, no murmurs, rubs or gallops Abdomen: no tenderness or distention, no masses by palpation,  no abnormal pulsatility or arterial bruits, normal bowel sounds, no hepatosplenomegaly Extremities: no clubbing, cyanosis or edema; 2+ radial, ulnar and brachial pulses bilaterally; 2+ right femoral, posterior tibial and dorsalis pedis pulses; 2+ left femoral, posterior tibial and dorsalis pedis pulses; no subclavian or femoral bruits Neurological: grossly nonfocal Psych: Normal mood  and affect   ASSESSMENT:    1. VT (ventricular tachycardia) (HCC)   2. Hypercholesterolemia   3. Medication management   4. Pre-diabetes   5. Paroxysmal atrial fibrillation (HCC)   6. Acquired thrombophilia (HCC)   7. Chronic combined systolic (congestive) and diastolic (congestive) heart failure (HCC)   8. Coronary artery disease involving native coronary artery of native heart without angina pectoris   9. ICD (implantable cardioverter-defibrillator) in place   10. Moderate obesity   11. Mixed hyperlipidemia   12. OSA (obstructive sleep apnea)         PLAN:    In order of problems listed above:  VT: The event in February is the first episode of VT that he had device upgrade to a dual-chamber device more than a year ago. Unable to increase the carvedilol  further due to symptoms of orthostatic hypotension.  Starting amiodarone does not appear warranted at this time. AFib: No recent events.  Infrequent episodes of atrial fibrillation requiring cardioversion once in 2020 and once in early 2024.  CHA2DS2-VASc 4 (CHF, CAD, HTN).  No meaningful atrial arrhythmia since device upgrade in February 2024. Anticoagulation: Has not had bleeding complications on Eliquis . CHF: Clinically euvolemic, NYHA functional class I-II, OptiVol in normal range.  NYHA functional class II.  He is on comprehensive guideline directed medical therapy on pretty high doses of beta-blocker, Entresto , spironolactone  as well as an SGLT2 inhibitor.  He does not require loop diuretics.  Most recent LVEF 30-35% with global hypokinesis.   Although he has mild coronary disease, he has primarily nonischemic cardiomyopathy. The MRI raised suspicion for prior myocarditis, but quite possibly at least in part related to alcohol  consumption.  We once again reviewed the need to avoid sodium rich foods such as deli meats and canned foods.  He would like to be reengaged in cardiac rehab.  Will make the referral and I hope that his insurance plan will cover it. CAD: Despite heavy burden of atherosclerotic plaque he does not have angina pectoris and he did not have severe stenoses on relatively recent CT angiogram with FFR.  Not on aspirin  due to full anticoagulation.  On lipid-lowering therapy. ICD: normal device function.  Appropriate intervention with ATP for monomorphic VT a few months ago, no high-voltage therapies delivered.  Continue remote downloads every 3 months. Obesity: Lost some additional weight after his episode appendicitis but remains moderately obese. HLP: On his labs from December all lipid parameters were in target range including his triglycerides.  Soon will be due for repeat labs especially to monitor his electrolytes and kidney function while on sotalol. OSA: Encourage nightly compliance with CPAP. ED: OK to take PDE5 inhibitors but never combine with nitrates. Prefer shorter acting sildenafil .    Medication Adjustments/Labs and Tests Ordered: Current medicines are reviewed at length with the patient today.  Concerns regarding medicines are outlined above.  Orders Placed This Encounter  Procedures   Comprehensive metabolic panel with GFR   Hemoglobin A1c   Lipid panel   Pro b natriuretic peptide (BNP)   Magnesium   AMB referral to cardiac rehabilitation    No orders of the defined types were placed in this encounter.    Patient Instructions  Medication Instructions:  No changes *If you need a refill on your cardiac medications before your next appointment, please call your pharmacy*  Lab Work: CMP, Lipid  Panel, AIC, BNP, Mag- Fasting lab work- nothing to eat after midnight- water  is ok If you have labs (blood  work) drawn today and your tests are completely normal, you will receive your results only by: MyChart Message (if you have MyChart) OR A paper copy in the mail If you have any lab test that is abnormal or we need to change your treatment, we will call you to review the results.  Follow-Up: At [facility], you and your health needs are our priority.  As part of our continuing mission to provide you with exceptional heart care, our providers are all part of one team.  This team includes your primary Cardiologist (physician) and Advanced Practice Providers or APPs (Physician Assistants and Nurse Practitioners) who all work together to provide you with the care you need, when you need it.  Your next appointment:   1 year(s)  Provider:   Luana Rumple, MD    We recommend signing up for the patient portal called "MyChart".  Sign up information is provided on this After Visit Summary.  MyChart is used to connect with patients for Virtual Visits (Telemedicine).  Patients are able to view lab/test results, encounter notes, upcoming appointments, etc.  Non-urgent messages can be sent to your provider as well.   To learn more about what you can do with MyChart, go to [email or web address].         Signed, Luana Rumple, MD  01/24/2024 1:24 PM    [facility]

## 2024-01-20 ENCOUNTER — Other Ambulatory Visit

## 2024-01-20 ENCOUNTER — Telehealth: Payer: Self-pay | Admitting: Family Medicine

## 2024-01-20 DIAGNOSIS — I10 Essential (primary) hypertension: Secondary | ICD-10-CM

## 2024-01-20 NOTE — Telephone Encounter (Signed)
 We can not process orders outside of [facility]. Can Hunter please re-order these under his name?

## 2024-01-20 NOTE — Telephone Encounter (Signed)
 This has been corrected.

## 2024-01-20 NOTE — Addendum Note (Signed)
 Addended by: Arva Lathe on: 01/20/2024 03:03 PM   Modules accepted: Orders

## 2024-01-20 NOTE — Telephone Encounter (Signed)
 Labs have been ordered

## 2024-01-20 NOTE — Telephone Encounter (Signed)
 Pt was scheduled for labs for our office. Orders are from Cardiology. Would you please reorder for patient to get done here?

## 2024-01-21 ENCOUNTER — Other Ambulatory Visit

## 2024-01-23 ENCOUNTER — Telehealth (HOSPITAL_COMMUNITY): Payer: Self-pay

## 2024-01-23 ENCOUNTER — Encounter (HOSPITAL_COMMUNITY): Payer: Self-pay

## 2024-01-23 NOTE — Telephone Encounter (Signed)
Attempted to call patient in regards to Cardiac Rehab - LM on VM   Sent letter 

## 2024-02-03 ENCOUNTER — Other Ambulatory Visit

## 2024-02-03 ENCOUNTER — Other Ambulatory Visit (INDEPENDENT_AMBULATORY_CARE_PROVIDER_SITE_OTHER)

## 2024-02-03 DIAGNOSIS — Z125 Encounter for screening for malignant neoplasm of prostate: Secondary | ICD-10-CM

## 2024-02-03 DIAGNOSIS — I10 Essential (primary) hypertension: Secondary | ICD-10-CM

## 2024-02-03 LAB — HEMOGLOBIN A1C: Hgb A1c MFr Bld: 5.8 % (ref 4.6–6.5)

## 2024-02-03 LAB — PSA: PSA: 6.34 ng/mL — ABNORMAL HIGH (ref 0.10–4.00)

## 2024-02-04 ENCOUNTER — Ambulatory Visit: Payer: Self-pay | Admitting: Family Medicine

## 2024-02-04 DIAGNOSIS — R972 Elevated prostate specific antigen [PSA]: Secondary | ICD-10-CM

## 2024-02-04 LAB — COMPREHENSIVE METABOLIC PANEL WITH GFR
ALT: 11 U/L (ref 0–53)
AST: 15 U/L (ref 0–37)
Albumin: 4.2 g/dL (ref 3.5–5.2)
Alkaline Phosphatase: 66 U/L (ref 39–117)
BUN: 17 mg/dL (ref 6–23)
CO2: 26 meq/L (ref 19–32)
Calcium: 9.5 mg/dL (ref 8.4–10.5)
Chloride: 98 meq/L (ref 96–112)
Creatinine, Ser: 1.07 mg/dL (ref 0.40–1.50)
GFR: 74.3 mL/min (ref >60.00)
Glucose, Bld: 112 mg/dL — ABNORMAL HIGH (ref 70–99)
Potassium: 4.7 meq/L (ref 3.5–5.1)
Sodium: 136 meq/L (ref 135–145)
Total Bilirubin: 0.4 mg/dL (ref 0.2–1.2)
Total Protein: 6.9 g/dL (ref 6.0–8.3)

## 2024-02-04 LAB — LIPID PANEL
Cholesterol: 150 mg/dL (ref 0–200)
HDL: 46.7 mg/dL (ref >39.00)
LDL Cholesterol: 65 mg/dL (ref 0–99)
NonHDL: 102.81
Total CHOL/HDL Ratio: 3
Triglycerides: 188 mg/dL — ABNORMAL HIGH (ref 0.0–149.0)
VLDL: 37.6 mg/dL (ref 0.0–40.0)

## 2024-02-04 LAB — PRO B NATRIURETIC PEPTIDE: NT-Pro BNP: 668 pg/mL — ABNORMAL HIGH (ref 0–210)

## 2024-02-04 LAB — MAGNESIUM: Magnesium: 2.5 mg/dL (ref 1.5–2.5)

## 2024-02-08 ENCOUNTER — Other Ambulatory Visit (HOSPITAL_COMMUNITY): Payer: Self-pay | Admitting: Cardiology

## 2024-02-11 ENCOUNTER — Ambulatory Visit (INDEPENDENT_AMBULATORY_CARE_PROVIDER_SITE_OTHER): Payer: Self-pay

## 2024-02-11 DIAGNOSIS — I428 Other cardiomyopathies: Secondary | ICD-10-CM

## 2024-02-12 LAB — CUP PACEART REMOTE DEVICE CHECK
Battery Remaining Longevity: 82 mo
Battery Remaining Percentage: 83 %
Battery Voltage: 3.01 V
Brady Statistic AP VP Percent: 6.1 %
Brady Statistic AP VS Percent: 74 %
Brady Statistic AS VP Percent: 1 %
Brady Statistic AS VS Percent: 17 %
Brady Statistic RA Percent Paced: 75 %
Brady Statistic RV Percent Paced: 6.1 %
Date Time Interrogation Session: 20250528030545
HighPow Impedance: 84 Ohm
Implantable Lead Connection Status: 753985
Implantable Lead Connection Status: 753985
Implantable Lead Implant Date: 20190821
Implantable Lead Implant Date: 20240226
Implantable Lead Location: 753859
Implantable Lead Location: 753860
Implantable Lead Model: 7122
Implantable Pulse Generator Implant Date: 20240226
Lead Channel Impedance Value: 480 Ohm
Lead Channel Impedance Value: 480 Ohm
Lead Channel Pacing Threshold Amplitude: 0.75 V
Lead Channel Pacing Threshold Amplitude: 0.75 V
Lead Channel Pacing Threshold Pulse Width: 0.5 ms
Lead Channel Pacing Threshold Pulse Width: 0.5 ms
Lead Channel Sensing Intrinsic Amplitude: 12 mV
Lead Channel Sensing Intrinsic Amplitude: 3.2 mV
Lead Channel Setting Pacing Amplitude: 2 V
Lead Channel Setting Pacing Amplitude: 2.5 V
Lead Channel Setting Pacing Pulse Width: 0.5 ms
Lead Channel Setting Sensing Sensitivity: 0.5 mV
Pulse Gen Serial Number: 211012451
Zone Setting Status: 755011

## 2024-02-17 ENCOUNTER — Encounter (HOSPITAL_COMMUNITY): Payer: Self-pay

## 2024-02-17 ENCOUNTER — Telehealth (HOSPITAL_COMMUNITY): Payer: Self-pay

## 2024-02-17 NOTE — Telephone Encounter (Signed)
 Pt insurance is active and benefits verified through [employer] adv Co-pay $15, DED 0/0 met, out of pocket $3,400/$210 met, co-insurance 0%. no pre-authorization required, [employer] 02/17/2024@3 :50, REF# [id number]   How many CR sessions are covered? (ICR)no limit Is this a lifetime maximum or an annual maximum? annual Has the member used any of these services to date? no Is there a time limit (weeks/months) on start of program and/or program completion? no     Will contact patient to see if he is interested in the Cardiac Rehab Program. If interested, patient will need to complete follow up appt. Once completed, patient will be contacted for scheduling upon review by the RN Navigator.

## 2024-02-17 NOTE — Telephone Encounter (Signed)
 Called patient to see if he was interested in participating in the Cardiac Rehab Program. Patient will come in for orientation on 6/17@115  and will attend the 1230 exercise class.   Pensions consultant.

## 2024-02-18 ENCOUNTER — Ambulatory Visit: Payer: Self-pay | Admitting: Cardiovascular Disease

## 2024-02-19 ENCOUNTER — Telehealth (HOSPITAL_COMMUNITY): Payer: Self-pay | Admitting: Cardiology

## 2024-02-19 NOTE — Telephone Encounter (Signed)
 Pts wife left VM reporting pt has received new phone.And with instillation of device app, serial number is needed    Will route to device clinic for assistance

## 2024-02-20 ENCOUNTER — Encounter: Payer: Self-pay | Admitting: *Deleted

## 2024-02-20 ENCOUNTER — Telehealth: Payer: Self-pay

## 2024-02-20 NOTE — Telephone Encounter (Signed)
 I have responded to pt via mychart.  Copied from CRM [id number]. Topic: General - Other >> Feb 19, 2024  4:40 PM [employer] wrote: Patient is requesting a list of recommended dental clinics. He does not want Dr. Manfred Seed. Responding via mychart is fine as well.  Callback #: [id number]

## 2024-02-20 NOTE — Telephone Encounter (Signed)
 Sent patient MyChart message with device serial number, and the contact number for the [employer] technical support team to assist him with setting up his phone app.

## 2024-03-01 ENCOUNTER — Other Ambulatory Visit (HOSPITAL_COMMUNITY): Payer: Self-pay | Admitting: Cardiology

## 2024-03-02 ENCOUNTER — Encounter (HOSPITAL_COMMUNITY)
Admission: RE | Admit: 2024-03-02 | Discharge: 2024-03-02 | Disposition: A | Discharge status: Home / Self Care | Source: Ambulatory Visit | Attending: Cardiovascular Disease | Admitting: Cardiovascular Disease

## 2024-03-02 VITALS — BP 100/60 | HR 71 | Ht 71.0 in | Wt 253.7 lb

## 2024-03-02 DIAGNOSIS — I5022 Chronic systolic (congestive) heart failure: Secondary | ICD-10-CM | POA: Insufficient documentation

## 2024-03-02 NOTE — Progress Notes (Signed)
 Cardiac Rehab Medication Review   Does the patient  feel that his/her medications are working for him/her?  yes  Has the patient been experiencing any side effects to the medications prescribed?  no  Does the patient measure his/her own blood pressure or blood glucose at home?  yes   Does the patient have any problems obtaining medications due to transportation or finances?   no  Understanding of regimen: excellent Understanding of indications: excellent Potential of compliance: excellent    Comments: No questions regarding medications today.    John [name] 03/02/2024 2:00 PM

## 2024-03-02 NOTE — Progress Notes (Signed)
 Cardiac Individual Treatment Plan  Patient Details  Name: John [name] MRN: [id number] Date of Birth: [birth year] Referring Provider:   Flowsheet Row INTENSIVE CARDIAC REHAB ORIENT from 03/02/2024 in [facility]  Referring Provider Mihai Croitoru    Initial Encounter Date:  Flowsheet Row INTENSIVE CARDIAC REHAB ORIENT from 03/02/2024 in [facility]  Date 03/02/24    Visit Diagnosis: Heart failure, chronic systolic (HCC)  Patient's Home Medications on Admission:  Current Outpatient Medications:    acetaminophen  (TYLENOL ) 500 MG tablet, Take 1,000 mg by mouth daily., Disp: , Rfl:    apixaban  (ELIQUIS ) 5 MG TABS tablet, Take 1 tablet (5 mg total) by mouth 2 (two) times daily., Disp: 28 tablet, Rfl: 0   bismuth subsalicylate (PEPTO BISMOL) 262 MG/15ML suspension, Take 30 mLs by mouth every 6 (six) hours as needed for diarrhea or loose stools., Disp: , Rfl:    carvedilol  (COREG ) 6.25 MG tablet, Take 1 tablet (6.25 mg total) by mouth 2 (two) times daily with a meal., Disp: 180 tablet, Rfl: 3   famotidine -calcium  carbonate-magnesium hydroxide (PEPCID  COMPLETE) 10-800-165 MG chewable tablet, Chew 1 tablet by mouth 2 (two) times daily as needed., Disp: 100 tablet, Rfl: 11   FARXIGA  10 MG TABS tablet, TAKE 1 TABLET (10 MG TOTAL) BY MOUTH DAILY. NEEDS FOLLOW UP APPOINTMENT FOR MORE REFILLS, Disp: 30 tablet, Rfl: 2   gatifloxacin (ZYMAXID) 0.5 % SOLN, Place 1 drop into the left eye 4 (four) times daily., Disp: , Rfl:    ipratropium-albuterol  (DUONEB) 0.5-2.5 (3) MG/3ML SOLN, Take 3 mLs by nebulization every 4 (four) hours as needed., Disp: 30 mL, Rfl: 1   ketorolac  (ACULAR ) 0.5 % ophthalmic solution, Place 1 drop into the left eye 4 (four) times daily., Disp: , Rfl:    Multiple Vitamin (MULTIVITAMIN WITH MINERALS) TABS tablet, Take 1 tablet by mouth in the morning., Disp: , Rfl:     prednisoLONE acetate (PRED FORTE) 1 % ophthalmic suspension, Place 1 drop into the left eye 4 (four) times daily., Disp: , Rfl:    rosuvastatin  (CRESTOR ) 20 MG tablet, TAKE 1 TABLET (20 MG TOTAL) BY MOUTH DAILY. NEEDS FOLLOW UP APPOINTMENT FOR MORE REFILLS, Disp: 90 tablet, Rfl: 0   sacubitril -valsartan  (ENTRESTO ) 97-103 MG, Take 1/2 tablet twice a day, Disp: , Rfl:    sertraline  (ZOLOFT ) 100 MG tablet, TAKE 1 TABLET BY MOUTH EVERY DAY, Disp: 90 tablet, Rfl: 3   sildenafil  (VIAGRA ) 100 MG tablet, TAKE 1 TABLET (100 MG TOTAL) BY MOUTH AT BEDTIME AS NEEDED FOR ERECTILE DYSFUNCTION., Disp: 90 tablet, Rfl: 3   torsemide  (DEMADEX ) 20 MG tablet, Take 2 tablets (40 mg total) by mouth daily., Disp: 180 tablet, Rfl: 0   traZODone  (DESYREL ) 50 MG tablet, Take 1 tablet (50 mg total) by mouth at bedtime., Disp: 90 tablet, Rfl: 3   meloxicam  (MOBIC ) 15 MG tablet, TAKE 1 TABLET (15 MG TOTAL) BY MOUTH DAILY. (Patient not taking: Reported on 03/02/2024), Disp: 30 tablet, Rfl: 0   Semaglutide -Weight Management 0.5 MG/0.5ML SOAJ, Inject 0.5 mg into the skin once a week. (Patient not taking: Reported on 03/02/2024), Disp: , Rfl:    spironolactone  (ALDACTONE ) 25 MG tablet, Take 1 tablet (25 mg total) by mouth daily. (Patient not taking: Reported on 03/02/2024), Disp: , Rfl:   Past Medical History: Past Medical History:  Diagnosis Date   AICD (automatic cardioverter/defibrillator) present    [employer]  Anxiety    Ativan    Chronic combined systolic and diastolic CHF, NYHA class 3 (HCC) 10/2016   Nonischemic cardiomyopathy. EF 20-25%.   Chronic left hip pain    Depression    history of   Dysrhythmia    A-FIB   GI bleed 03/2016   Headache    Hypertensive heart disease with combined systolic and diastolic congestive heart failure (HCC) 10/2016   Nonischemic cardiomyopathy (HCC) 10/2016   Echo with EF 20-25%. Cardiac catheterization with no CAD. LVEDP was 41 mmHg, PCWP 36 mmHg   Osteoarthritis    right shoulder    Right hand fracture    Seasonal allergies    Sleep apnea    wears a CPAP   Wears glasses     Tobacco Use: Social History   Tobacco Use  Smoking Status Never  Smokeless Tobacco Never    Labs: Review Flowsheet  More data exists      Latest Ref Rng & Units 08/22/2022 02/27/2023 03/06/2023 08/22/2023 02/03/2024  Labs for ITP Cardiac and Pulmonary Rehab  Cholestrol 0 - 200 mg/dL 213  - 086  578  469   LDL (calc) 0 - 99 mg/dL 48  - - 67  65   Direct LDL mg/dL - - 62.9  - -  HDL-C >52.84 mg/dL 13.24  - 40.10  27.25  46.70   Trlycerides 0.0 - 149.0 mg/dL 366.4  - 403.4  742.5  188.0   Hemoglobin A1c 4.6 - 6.5 % 6.0  5.6  - - 5.8     Capillary Blood Glucose: Lab Results  Component Value Date   GLUCAP 115 (H) 09/09/2023   GLUCAP 103 (H) 09/08/2023   GLUCAP 171 (H) 05/04/2021   GLUCAP 138 (H) 05/04/2021   GLUCAP 144 (H) 05/04/2021     Exercise Target Goals: Exercise Program Goal: Individual exercise prescription set using results from initial 6 min walk test and THRR while considering  patient's activity barriers and safety.   Exercise Prescription Goal: Initial exercise prescription builds to 30-45 minutes a day of aerobic activity, 2-3 days per week.  Home exercise guidelines will be given to patient during program as part of exercise prescription that the participant will acknowledge.  Activity Barriers & Risk Stratification:  Activity Barriers & Cardiac Risk Stratification - 03/02/24 1421       Activity Barriers & Cardiac Risk Stratification   Activity Barriers Left Hip Replacement;Right Hip Replacement;Joint Problems;Arthritis;History of Falls;Balance Concerns;Decreased Ventricular Function;Deconditioning;Back Problems;Other (comment)    Comments Left/Right shoulder replacements    Cardiac Risk Stratification High          6 Minute Walk:  6 Minute Walk     Row Name 03/02/24 1505         6 Minute Walk   Phase Initial  Pt used GO-Cart     Distance 1192 feet      Walk Time 6 minutes     # of Rest Breaks 2  3:07-3:34; 5:16-5:52     MPH 2.26     METS 2.92     RPE 13     Perceived Dyspnea  0     VO2 Peak 10.2     Symptoms Yes (comment)     Comments Low back pain and tightness, 9/10     Resting HR 73 bpm     Resting BP 100/60     Resting Oxygen  Saturation  96 %     Exercise Oxygen  Saturation  during 6 min walk 97 %  Max Ex. HR 120 bpm     Max Ex. BP 134/78     2 Minute Post BP 120/72        Oxygen  Initial Assessment:   Oxygen  Re-Evaluation:   Oxygen  Discharge (Final Oxygen  Re-Evaluation):   Initial Exercise Prescription:  Initial Exercise Prescription - 03/02/24 1400       Date of Initial Exercise RX and Referring Provider   Date 03/02/24    Referring Provider Mihai Croitoru      Recumbant Bike   Level 1    RPM 60    Watts 34    Minutes 15    METs 2.9      NuStep   Level 2    SPM 75    Minutes 15    METs 2.9      Prescription Details   Frequency (times per week) 3    Duration Progress to 30 minutes of continuous aerobic without signs/symptoms of physical distress      Intensity   THRR 40-80% of Max Heartrate 63-126    Ratings of Perceived Exertion 11-13    Perceived Dyspnea 0-4      Progression   Progression Continue progressive overload as per policy without signs/symptoms or physical distress.      Resistance Training   Training Prescription Yes    Weight 3lbs    Reps 10-15          Perform Capillary Blood Glucose checks as needed.  Exercise Prescription Changes:   Exercise Comments:   Exercise Goals and Review:   Exercise Goals     Row Name 03/02/24 1422             Exercise Goals   Increase Physical Activity Yes       Intervention Provide advice, education, support and counseling about physical activity/exercise needs.;Develop an individualized exercise prescription for aerobic and resistive training based on initial evaluation findings, risk stratification, comorbidities and  participant's personal goals.       Expected Outcomes Short Term: Attend rehab on a regular basis to increase amount of physical activity.;Long Term: Exercising regularly at least 3-5 days a week.;Long Term: Add in home exercise to make exercise part of routine and to increase amount of physical activity.       Increase Strength and Stamina Yes       Intervention Provide advice, education, support and counseling about physical activity/exercise needs.;Develop an individualized exercise prescription for aerobic and resistive training based on initial evaluation findings, risk stratification, comorbidities and participant's personal goals.       Expected Outcomes Short Term: Increase workloads from initial exercise prescription for resistance, speed, and METs.;Short Term: Perform resistance training exercises routinely during rehab and add in resistance training at home;Long Term: Improve cardiorespiratory fitness, muscular endurance and strength as measured by increased METs and functional capacity ( )       Able to understand and use rate of perceived exertion (RPE) scale Yes       Intervention Provide education and explanation on how to use RPE scale       Expected Outcomes Short Term: Able to use RPE daily in rehab to express subjective intensity level;Long Term:  Able to use RPE to guide intensity level when exercising independently       Knowledge and understanding of Target Heart Rate Range (THRR) Yes       Intervention Provide education and explanation of THRR including how the numbers were predicted and where they are located for reference  Expected Outcomes Short Term: Able to state/look up THRR;Long Term: Able to use THRR to govern intensity when exercising independently;Short Term: Able to use daily as guideline for intensity in rehab       Understanding of Exercise Prescription Yes       Intervention Provide education, explanation, and written materials on patient's individual exercise  prescription       Expected Outcomes Short Term: Able to explain program exercise prescription;Long Term: Able to explain home exercise prescription to exercise independently          Exercise Goals Re-Evaluation :   Discharge Exercise Prescription (Final Exercise Prescription Changes):   Nutrition:  Target Goals: Understanding of nutrition guidelines, daily intake of sodium 1500mg , cholesterol 200mg , calories 30% from fat and 7% or less from saturated fats, daily to have 5 or more servings of fruits and vegetables.  Biometrics:  Pre Biometrics - 03/02/24 1405       Pre Biometrics   Waist Circumference 51.5 inches    Hip Circumference 49.5 inches    Waist to Hip Ratio 1.04 %    Triceps Skinfold 36 mm    % Body Fat 38.9 %    Grip Strength 22 kg    Flexibility --   Not performed, low back pain   Single Leg Stand 3 seconds           Nutrition Therapy Plan and Nutrition Goals:   Nutrition Assessments:  MEDIFICTS Score Key: >=70 Need to make dietary changes  40-70 Heart Healthy Diet <= 40 Therapeutic Level Cholesterol Diet    Picture Your Plate Scores: <16 Unhealthy dietary pattern with much room for improvement. 41-50 Dietary pattern unlikely to meet recommendations for good health and room for improvement. 51-60 More healthful dietary pattern, with some room for improvement.  >60 Healthy dietary pattern, although there may be some specific behaviors that could be improved.    Nutrition Goals Re-Evaluation:   Nutrition Goals Re-Evaluation:   Nutrition Goals Discharge (Final Nutrition Goals Re-Evaluation):   Psychosocial: Target Goals: Acknowledge presence or absence of significant depression and/or stress, maximize coping skills, provide positive support system. Participant is able to verbalize types and ability to use techniques and skills needed for reducing stress and depression.  Initial Review & Psychosocial Screening:  Initial Psych Review &  Screening - 03/02/24 1416       Initial Review   Current issues with Current Depression;Current Sleep Concerns;Current Stress Concerns    Source of Stress Concerns Family    Comments Stress mainly regarding chlidren      Family Dynamics   Good Support System? Yes   Has sposue for support     Barriers   Psychosocial barriers to participate in program The patient should benefit from training in stress management and relaxation.      Screening Interventions   Interventions Encouraged to exercise    Expected Outcomes Short Term goal: Identification and review with participant of any Quality of Life or Depression concerns found by scoring the questionnaire.;Long Term goal: The participant improves quality of Life and PHQ9 Scores as seen by post scores and/or verbalization of changes;Short Term goal: Utilizing psychosocial counselor, staff and physician to assist with identification of specific Stressors or current issues interfering with healing process. Setting desired goal for each stressor or current issue identified.;Long Term Goal: Stressors or current issues are controlled or eliminated.          Quality of Life Scores:  Quality of Life - 03/02/24 1545  Quality of Life   Select Quality of Life      Quality of Life Scores   Health/Function Pre 19.67 %    Socioeconomic Pre 26.64 %    Psych/Spiritual Pre 21.57 %    Family Pre 28.8 %    GLOBAL Pre 22.84 %         Scores of 19 and below usually indicate a poorer quality of life in these areas.  A difference of  2-3 points is a clinically meaningful difference.  A difference of 2-3 points in the total score of the Quality of Life Index has been associated with significant improvement in overall quality of life, self-image, physical symptoms, and general health in studies assessing change in quality of life.  PHQ-9: Review Flowsheet  More data exists      03/02/2024 11/13/2023 05/09/2023 02/18/2023 08/22/2022  Depression screen  PHQ 2/9  Decreased Interest 2 0 0 0 0  Down, Depressed, Hopeless 1 0 0 0 0  PHQ - 2 Score 3 0 0 0 0  Altered sleeping 2 - - - 0  Tired, decreased energy 2 - - - 0  Change in appetite 0 - - - 0  Feeling bad or failure about yourself  1 - - - 0  Trouble concentrating 0 - - - 0  Moving slowly or fidgety/restless 0 - - - 0  Suicidal thoughts 0 - - - 0  PHQ-9 Score 8 - - - 0  Difficult doing work/chores Not difficult at all - - - -   Interpretation of Total Score  Total Score Depression Severity:  1-4 = Minimal depression, 5-9 = Mild depression, 10-14 = Moderate depression, 15-19 = Moderately severe depression, 20-27 = Severe depression   Psychosocial Evaluation and Intervention:   Psychosocial Re-Evaluation:   Psychosocial Discharge (Final Psychosocial Re-Evaluation):   Vocational Rehabilitation: Provide vocational rehab assistance to qualifying candidates.   Vocational Rehab Evaluation & Intervention:  Vocational Rehab - 03/02/24 1424       Initial Vocational Rehab Evaluation & Intervention   Assessment shows need for Vocational Rehabilitation No   Pt is retired         Education: Education Goals: Education classes will be provided on a weekly basis, covering required topics. Participant will state understanding/return demonstration of topics presented.     Core Videos: Exercise    Move It!  Clinical staff conducted group or individual video education with verbal and written material and guidebook.  Patient learns the recommended Pritikin exercise program. Exercise with the goal of living a long, healthy life. Some of the health benefits of exercise include controlled diabetes, healthier blood pressure levels, improved cholesterol levels, improved heart and lung capacity, improved sleep, and better body composition. Everyone should speak with their doctor before starting or changing an exercise routine.  Biomechanical Limitations Clinical staff conducted group or  individual video education with verbal and written material and guidebook.  Patient learns how biomechanical limitations can impact exercise and how we can mitigate and possibly overcome limitations to have an impactful and balanced exercise routine.  Body Composition Clinical staff conducted group or individual video education with verbal and written material and guidebook.  Patient learns that body composition (ratio of muscle mass to fat mass) is a key component to assessing overall fitness, rather than body weight alone. Increased fat mass, especially visceral belly fat, can put us  at increased risk for metabolic syndrome, type 2 diabetes, heart disease, and even death. It is recommended to combine  diet and exercise (cardiovascular and resistance training) to improve your body composition. Seek guidance from your physician and exercise physiologist before implementing an exercise routine.  Exercise Action Plan Clinical staff conducted group or individual video education with verbal and written material and guidebook.  Patient learns the recommended strategies to achieve and enjoy long-term exercise adherence, including variety, self-motivation, self-efficacy, and positive decision making. Benefits of exercise include fitness, good health, weight management, more energy, better sleep, less stress, and overall well-being.  Medical   Heart Disease Risk Reduction Clinical staff conducted group or individual video education with verbal and written material and guidebook.  Patient learns our heart is our most vital organ as it circulates oxygen , nutrients, white blood cells, and hormones throughout the entire body, and carries waste away. Data supports a plant-based eating plan like the Pritikin Program for its effectiveness in slowing progression of and reversing heart disease. The video provides a number of recommendations to address heart disease.   Metabolic Syndrome and Belly Fat  Clinical staff  conducted group or individual video education with verbal and written material and guidebook.  Patient learns what metabolic syndrome is, how it leads to heart disease, and how one can reverse it and keep it from coming back. You have metabolic syndrome if you have 3 of the following 5 criteria: abdominal obesity, high blood pressure, high triglycerides, low HDL cholesterol, and high blood sugar.  Hypertension and Heart Disease Clinical staff conducted group or individual video education with verbal and written material and guidebook.  Patient learns that high blood pressure, or hypertension, is very common in the [place] . Hypertension is largely due to excessive salt intake, but other important risk factors include being overweight, physical inactivity, drinking too much alcohol , smoking, and not eating enough potassium from fruits and vegetables. High blood pressure is a leading risk factor for heart attack, stroke, congestive heart failure, dementia, kidney failure, and premature death. Long-term effects of excessive salt intake include stiffening of the arteries and thickening of heart muscle and organ damage. Recommendations include ways to reduce hypertension and the risk of heart disease.  Diseases of Our Time - Focusing on Diabetes Clinical staff conducted group or individual video education with verbal and written material and guidebook.  Patient learns why the best way to stop diseases of our time is prevention, through food and other lifestyle changes. Medicine (such as prescription pills and surgeries) is often only a Band-Aid on the problem, not a long-term solution. Most common diseases of our time include obesity, type 2 diabetes, hypertension, heart disease, and cancer. The Pritikin Program is recommended and has been proven to help reduce, reverse, and/or prevent the damaging effects of metabolic syndrome.  Nutrition   Overview of the Pritikin Eating Plan  Clinical staff conducted  group or individual video education with verbal and written material and guidebook.  Patient learns about the Pritikin Eating Plan for disease risk reduction. The Pritikin Eating Plan emphasizes a wide variety of unrefined, minimally-processed carbohydrates, like fruits, vegetables, whole grains, and legumes. Go, Caution, and Stop food choices are explained. Plant-based and lean animal proteins are emphasized. Rationale provided for low sodium intake for blood pressure control, low added sugars for blood sugar stabilization, and low added fats and oils for coronary artery disease risk reduction and weight management.  Calorie Density  Clinical staff conducted group or individual video education with verbal and written material and guidebook.  Patient learns about calorie density and how it impacts the Pritikin Eating  Plan. Knowing the characteristics of the food you choose will help you decide whether those foods will lead to weight gain or weight loss, and whether you want to consume more or less of them. Weight loss is usually a side effect of the Pritikin Eating Plan because of its focus on low calorie-dense foods.  Label Reading  Clinical staff conducted group or individual video education with verbal and written material and guidebook.  Patient learns about the Pritikin recommended label reading guidelines and corresponding recommendations regarding calorie density, added sugars, sodium content, and whole grains.  Dining Out - Part 1  Clinical staff conducted group or individual video education with verbal and written material and guidebook.  Patient learns that restaurant meals can be sabotaging because they can be so high in calories, fat, sodium, and/or sugar. Patient learns recommended strategies on how to positively address this and avoid unhealthy pitfalls.  Facts on Fats  Clinical staff conducted group or individual video education with verbal and written material and guidebook.  Patient  learns that lifestyle modifications can be just as effective, if not more so, as many medications for lowering your risk of heart disease. A Pritikin lifestyle can help to reduce your risk of inflammation and atherosclerosis (cholesterol build-up, or plaque, in the artery walls). Lifestyle interventions such as dietary choices and physical activity address the cause of atherosclerosis. A review of the types of fats and their impact on blood cholesterol levels, along with dietary recommendations to reduce fat intake is also included.  Nutrition Action Plan  Clinical staff conducted group or individual video education with verbal and written material and guidebook.  Patient learns how to incorporate Pritikin recommendations into their lifestyle. Recommendations include planning and keeping personal health goals in mind as an important part of their success.  Healthy Mind-Set    Healthy Minds, Bodies, Hearts  Clinical staff conducted group or individual video education with verbal and written material and guidebook.  Patient learns how to identify when they are stressed. Video will discuss the impact of that stress, as well as the many benefits of stress management. Patient will also be introduced to stress management techniques. The way we think, act, and feel has an impact on our hearts.  How Our Thoughts Can Heal Our Hearts  Clinical staff conducted group or individual video education with verbal and written material and guidebook.  Patient learns that negative thoughts can cause depression and anxiety. This can result in negative lifestyle behavior and serious health problems. Cognitive behavioral therapy is an effective method to help control our thoughts in order to change and improve our emotional outlook.  Additional Videos:  Exercise    Improving Performance  Clinical staff conducted group or individual video education with verbal and written material and guidebook.  Patient learns to use a  non-linear approach by alternating intensity levels and lengths of time spent exercising to help burn more calories and lose more body fat. Cardiovascular exercise helps improve heart health, metabolism, hormonal balance, blood sugar control, and recovery from fatigue. Resistance training improves strength, endurance, balance, coordination, reaction time, metabolism, and muscle mass. Flexibility exercise improves circulation, posture, and balance. Seek guidance from your physician and exercise physiologist before implementing an exercise routine and learn your capabilities and proper form for all exercise.  Introduction to Yoga  Clinical staff conducted group or individual video education with verbal and written material and guidebook.  Patient learns about yoga, a discipline of the coming together of mind, breath, and body.  The benefits of yoga include improved flexibility, improved range of motion, better posture and core strength, increased lung function, weight loss, and positive self-image. Yoga's heart health benefits include lowered blood pressure, healthier heart rate, decreased cholesterol and triglyceride levels, improved immune function, and reduced stress. Seek guidance from your physician and exercise physiologist before implementing an exercise routine and learn your capabilities and proper form for all exercise.  Medical   Aging: Enhancing Your Quality of Life  Clinical staff conducted group or individual video education with verbal and written material and guidebook.  Patient learns key strategies and recommendations to stay in good physical health and enhance quality of life, such as prevention strategies, having an advocate, securing a Health Care Proxy and Power of Attorney, and keeping a list of medications and system for tracking them. It also discusses how to avoid risk for bone loss.  Biology of Weight Control  Clinical staff conducted group or individual video education with  verbal and written material and guidebook.  Patient learns that weight gain occurs because we consume more calories than we burn (eating more, moving less). Even if your body weight is normal, you may have higher ratios of fat compared to muscle mass. Too much body fat puts you at increased risk for cardiovascular disease, heart attack, stroke, type 2 diabetes, and obesity-related cancers. In addition to exercise, following the Pritikin Eating Plan can help reduce your risk.  Decoding Lab Results  Clinical staff conducted group or individual video education with verbal and written material and guidebook.  Patient learns that lab test reflects one measurement whose values change over time and are influenced by many factors, including medication, stress, sleep, exercise, food, hydration, pre-existing medical conditions, and more. It is recommended to use the knowledge from this video to become more involved with your lab results and evaluate your numbers to speak with your doctor.   Diseases of Our Time - Overview  Clinical staff conducted group or individual video education with verbal and written material and guidebook.  Patient learns that according to the CDC, 50% to 70% of chronic diseases (such as obesity, type 2 diabetes, elevated lipids, hypertension, and heart disease) are avoidable through lifestyle improvements including healthier food choices, listening to satiety cues, and increased physical activity.  Sleep Disorders Clinical staff conducted group or individual video education with verbal and written material and guidebook.  Patient learns how good quality and duration of sleep are important to overall health and well-being. Patient also learns about sleep disorders and how they impact health along with recommendations to address them, including discussing with a physician.  Nutrition  Dining Out - Part 2 Clinical staff conducted group or individual video education with verbal and  written material and guidebook.  Patient learns how to plan ahead and communicate in order to maximize their dining experience in a healthy and nutritious manner. Included are recommended food choices based on the type of restaurant the patient is visiting.   Fueling a Banker conducted group or individual video education with verbal and written material and guidebook.  There is a strong connection between our food choices and our health. Diseases like obesity and type 2 diabetes are very prevalent and are in large-part due to lifestyle choices. The Pritikin Eating Plan provides plenty of food and hunger-curbing satisfaction. It is easy to follow, affordable, and helps reduce health risks.  Menu Workshop  Clinical staff conducted group or individual video education with verbal and written material  and guidebook.  Patient learns that restaurant meals can sabotage health goals because they are often packed with calories, fat, sodium, and sugar. Recommendations include strategies to plan ahead and to communicate with the manager, chef, or server to help order a healthier meal.  Planning Your Eating Strategy  Clinical staff conducted group or individual video education with verbal and written material and guidebook.  Patient learns about the Pritikin Eating Plan and its benefit of reducing the risk of disease. The Pritikin Eating Plan does not focus on calories. Instead, it emphasizes high-quality, nutrient-rich foods. By knowing the characteristics of the foods, we choose, we can determine their calorie density and make informed decisions.  Targeting Your Nutrition Priorities  Clinical staff conducted group or individual video education with verbal and written material and guidebook.  Patient learns that lifestyle habits have a tremendous impact on disease risk and progression. This video provides eating and physical activity recommendations based on your personal health goals,  such as reducing LDL cholesterol, losing weight, preventing or controlling type 2 diabetes, and reducing high blood pressure.  Vitamins and Minerals  Clinical staff conducted group or individual video education with verbal and written material and guidebook.  Patient learns different ways to obtain key vitamins and minerals, including through a recommended healthy diet. It is important to discuss all supplements you take with your doctor.   Healthy Mind-Set    Smoking Cessation  Clinical staff conducted group or individual video education with verbal and written material and guidebook.  Patient learns that cigarette smoking and tobacco addiction pose a serious health risk which affects millions of people. Stopping smoking will significantly reduce the risk of heart disease, lung disease, and many forms of cancer. Recommended strategies for quitting are covered, including working with your doctor to develop a successful plan.  Culinary   Becoming a Set designer conducted group or individual video education with verbal and written material and guidebook.  Patient learns that cooking at home can be healthy, cost-effective, quick, and puts them in control. Keys to cooking healthy recipes will include looking at your recipe, assessing your equipment needs, planning ahead, making it simple, choosing cost-effective seasonal ingredients, and limiting the use of added fats, salts, and sugars.  Cooking - Breakfast and Snacks  Clinical staff conducted group or individual video education with verbal and written material and guidebook.  Patient learns how important breakfast is to satiety and nutrition through the entire day. Recommendations include key foods to eat during breakfast to help stabilize blood sugar levels and to prevent overeating at meals later in the day. Planning ahead is also a key component.  Cooking - Educational psychologist conducted group or individual video  education with verbal and written material and guidebook.  Patient learns eating strategies to improve overall health, including an approach to cook more at home. Recommendations include thinking of animal protein as a side on your plate rather than center stage and focusing instead on lower calorie dense options like vegetables, fruits, whole grains, and plant-based proteins, such as beans. Making sauces in large quantities to freeze for later and leaving the skin on your vegetables are also recommended to maximize your experience.  Cooking - Healthy Salads and Dressing Clinical staff conducted group or individual video education with verbal and written material and guidebook.  Patient learns that vegetables, fruits, whole grains, and legumes are the foundations of the Pritikin Eating Plan. Recommendations include how to incorporate each of these  in flavorful and healthy salads, and how to create homemade salad dressings. Proper handling of ingredients is also covered. Cooking - Soups and [employer] - Soups and Desserts Clinical staff conducted group or individual video education with verbal and written material and guidebook.  Patient learns that Pritikin soups and desserts make for easy, nutritious, and delicious snacks and meal components that are low in sodium, fat, sugar, and calorie density, while high in vitamins, minerals, and filling fiber. Recommendations include simple and healthy ideas for soups and desserts.   Overview     The Pritikin Solution Program Overview Clinical staff conducted group or individual video education with verbal and written material and guidebook.  Patient learns that the results of the Pritikin Program have been documented in more than 100 articles published in peer-reviewed journals, and the benefits include reducing risk factors for (and, in some cases, even reversing) high cholesterol, high blood pressure, type 2 diabetes, obesity, and more! An overview of  the three key pillars of the Pritikin Program will be covered: eating well, doing regular exercise, and having a healthy mind-set.  WORKSHOPS  Exercise: Exercise Basics: Building Your Action Plan Clinical staff led group instruction and group discussion with PowerPoint presentation and patient guidebook. To enhance the learning environment the use of posters, models and videos may be added. At the conclusion of this workshop, patients will comprehend the difference between physical activity and exercise, as well as the benefits of incorporating both, into their routine. Patients will understand the FITT (Frequency, Intensity, Time, and Type) principle and how to use it to build an exercise action plan. In addition, safety concerns and other considerations for exercise and cardiac rehab will be addressed by the presenter. The purpose of this lesson is to promote a comprehensive and effective weekly exercise routine in order to improve patients' overall level of fitness.   Managing Heart Disease: Your Path to a Healthier Heart Clinical staff led group instruction and group discussion with PowerPoint presentation and patient guidebook. To enhance the learning environment the use of posters, models and videos may be added.At the conclusion of this workshop, patients will understand the anatomy and physiology of the heart. Additionally, they will understand how Pritikin's three pillars impact the risk factors, the progression, and the management of heart disease.  The purpose of this lesson is to provide a high-level overview of the heart, heart disease, and how the Pritikin lifestyle positively impacts risk factors.  Exercise Biomechanics Clinical staff led group instruction and group discussion with PowerPoint presentation and patient guidebook. To enhance the learning environment the use of posters, models and videos may be added. Patients will learn how the structural parts of their bodies  function and how these functions impact their daily activities, movement, and exercise. Patients will learn how to promote a neutral spine, learn how to manage pain, and identify ways to improve their physical movement in order to promote healthy living. The purpose of this lesson is to expose patients to common physical limitations that impact physical activity. Participants will learn practical ways to adapt and manage aches and pains, and to minimize their effect on regular exercise. Patients will learn how to maintain good posture while sitting, walking, and lifting.  Balance Training and Fall Prevention  Clinical staff led group instruction and group discussion with PowerPoint presentation and patient guidebook. To enhance the learning environment the use of posters, models and videos may be added. At the conclusion of this workshop, patients will understand the  importance of their sensorimotor skills (vision, proprioception, and the vestibular system) in maintaining their ability to balance as they age. Patients will apply a variety of balancing exercises that are appropriate for their current level of function. Patients will understand the common causes for poor balance, possible solutions to these problems, and ways to modify their physical environment in order to minimize their fall risk. The purpose of this lesson is to teach patients about the importance of maintaining balance as they age and ways to minimize their risk of falling.  WORKSHOPS   Nutrition:  Fueling a Ship broker led group instruction and group discussion with PowerPoint presentation and patient guidebook. To enhance the learning environment the use of posters, models and videos may be added. Patients will review the foundational principles of the Pritikin Eating Plan and understand what constitutes a serving size in each of the food groups. Patients will also learn Pritikin-friendly foods that are better  choices when away from home and review make-ahead meal and snack options. Calorie density will be reviewed and applied to three nutrition priorities: weight maintenance, weight loss, and weight gain. The purpose of this lesson is to reinforce (in a group setting) the key concepts around what patients are recommended to eat and how to apply these guidelines when away from home by planning and selecting Pritikin-friendly options. Patients will understand how calorie density may be adjusted for different weight management goals.  Mindful Eating  Clinical staff led group instruction and group discussion with PowerPoint presentation and patient guidebook. To enhance the learning environment the use of posters, models and videos may be added. Patients will briefly review the concepts of the Pritikin Eating Plan and the importance of low-calorie dense foods. The concept of mindful eating will be introduced as well as the importance of paying attention to internal hunger signals. Triggers for non-hunger eating and techniques for dealing with triggers will be explored. The purpose of this lesson is to provide patients with the opportunity to review the basic principles of the Pritikin Eating Plan, discuss the value of eating mindfully and how to measure internal cues of hunger and fullness using the Hunger Scale. Patients will also discuss reasons for non-hunger eating and learn strategies to use for controlling emotional eating.  Targeting Your Nutrition Priorities Clinical staff led group instruction and group discussion with PowerPoint presentation and patient guidebook. To enhance the learning environment the use of posters, models and videos may be added. Patients will learn how to determine their genetic susceptibility to disease by reviewing their family history. Patients will gain insight into the importance of diet as part of an overall healthy lifestyle in mitigating the impact of genetics and other  environmental insults. The purpose of this lesson is to provide patients with the opportunity to assess their personal nutrition priorities by looking at their family history, their own health history and current risk factors. Patients will also be able to discuss ways of prioritizing and modifying the Pritikin Eating Plan for their highest risk areas  Menu  Clinical staff led group instruction and group discussion with PowerPoint presentation and patient guidebook. To enhance the learning environment the use of posters, models and videos may be added. Using menus brought in from [employer], or printed from [employer], patients will apply the Pritikin dining out guidelines that were presented in the [employer] video. Patients will also be able to practice these guidelines in a variety of provided scenarios. The purpose of this  lesson is to provide patients with the opportunity to practice hands-on learning of the Pritikin Dining Out guidelines with actual menus and practice scenarios.  Label Reading Clinical staff led group instruction and group discussion with PowerPoint presentation and patient guidebook. To enhance the learning environment the use of posters, models and videos may be added. Patients will review and discuss the Pritikin label reading guidelines presented in Pritikin's Label Reading Educational series video. Using fool labels brought in from local grocery stores and markets, patients will apply the label reading guidelines and determine if the packaged food meet the Pritikin guidelines. The purpose of this lesson is to provide patients with the opportunity to review, discuss, and practice hands-on learning of the Pritikin Label Reading guidelines with actual packaged food labels. Cooking School  Pritikin's [employer] are designed to teach patients ways to prepare quick, simple, and affordable recipes at home. The importance of nutrition's role in  chronic disease risk reduction is reflected in its emphasis in the overall Pritikin program. By learning how to prepare essential core Pritikin Eating Plan recipes, patients will increase control over what they eat; be able to customize the flavor of foods without the use of added salt, sugar, or fat; and improve the quality of the food they consume. By learning a set of core recipes which are easily assembled, quickly prepared, and affordable, patients are more likely to prepare more healthy foods at home. These workshops focus on convenient breakfasts, simple entres, side dishes, and desserts which can be prepared with minimal effort and are consistent with nutrition recommendations for cardiovascular risk reduction. Cooking [employer] are taught by a Armed forces logistics/support/administrative officer (RD) who has been trained by the [employer]. The chef or RD has a clear understanding of the importance of minimizing - if not completely eliminating - added fat, sugar, and sodium in recipes. Throughout the series of Cooking School Workshop sessions, patients will learn about healthy ingredients and efficient methods of cooking to build confidence in their capability to prepare    Cooking School weekly topics:  Adding Flavor- Sodium-Free  Fast and Healthy Breakfasts  Powerhouse Plant-Based Proteins  Satisfying Salads and Dressings  Simple Sides and Sauces  International Cuisine-Spotlight on the [employer] Zones  Delicious Desserts  Savory Soups  [employer] - Meals in a Astronomer Appetizers and Snacks  Comforting Weekend Breakfasts  One-Pot Wonders   Fast Evening Meals  Landscape architect Your Pritikin Plate  WORKSHOPS   Healthy Mindset (Psychosocial):  Focused Goals, Sustainable Changes Clinical staff led group instruction and group discussion with PowerPoint presentation and patient guidebook. To enhance the learning environment the use of posters, models and videos may  be added. Patients will be able to apply effective goal setting strategies to establish at least one personal goal, and then take consistent, meaningful action toward that goal. They will learn to identify common barriers to achieving personal goals and develop strategies to overcome them. Patients will also gain an understanding of how our mind-set can impact our ability to achieve goals and the importance of cultivating a positive and growth-oriented mind-set. The purpose of this lesson is to provide patients with a deeper understanding of how to set and achieve personal goals, as well as the tools and strategies needed to overcome common obstacles which may arise along the way.  From Head to Heart: The Power of a Healthy Outlook  Clinical staff led group instruction and group discussion with PowerPoint presentation  and patient guidebook. To enhance the learning environment the use of posters, models and videos may be added. Patients will be able to recognize and describe the impact of emotions and mood on physical health. They will discover the importance of self-care and explore self-care practices which may work for them. Patients will also learn how to utilize the 4 C's to cultivate a healthier outlook and better manage stress and challenges. The purpose of this lesson is to demonstrate to patients how a healthy outlook is an essential part of maintaining good health, especially as they continue their cardiac rehab journey.  Healthy Sleep for a Healthy Heart Clinical staff led group instruction and group discussion with PowerPoint presentation and patient guidebook. To enhance the learning environment the use of posters, models and videos may be added. At the conclusion of this workshop, patients will be able to demonstrate knowledge of the importance of sleep to overall health, well-being, and quality of life. They will understand the symptoms of, and treatments for, common sleep disorders. Patients  will also be able to identify daytime and nighttime behaviors which impact sleep, and they will be able to apply these tools to help manage sleep-related challenges. The purpose of this lesson is to provide patients with a general overview of sleep and outline the importance of quality sleep. Patients will learn about a few of the most common sleep disorders. Patients will also be introduced to the concept of "sleep hygiene," and discover ways to self-manage certain sleeping problems through simple daily behavior changes. Finally, the workshop will motivate patients by clarifying the links between quality sleep and their goals of heart-healthy living.   Recognizing and Reducing Stress Clinical staff led group instruction and group discussion with PowerPoint presentation and patient guidebook. To enhance the learning environment the use of posters, models and videos may be added. At the conclusion of this workshop, patients will be able to understand the types of stress reactions, differentiate between acute and chronic stress, and recognize the impact that chronic stress has on their health. They will also be able to apply different coping mechanisms, such as reframing negative self-talk. Patients will have the opportunity to practice a variety of stress management techniques, such as deep abdominal breathing, progressive muscle relaxation, and/or guided imagery.  The purpose of this lesson is to educate patients on the role of stress in their lives and to provide healthy techniques for coping with it.  Learning Barriers/Preferences:  Learning Barriers/Preferences - 03/02/24 1546       Learning Barriers/Preferences   Learning Barriers Sight    Learning Preferences Video;Pictoral;Computer/Internet          Education Topics:  Knowledge Questionnaire Score:  Knowledge Questionnaire Score - 03/02/24 1546       Knowledge Questionnaire Score   Pre Score 24/24          Core Components/Risk  Factors/Patient Goals at Admission:  Personal Goals and Risk Factors at Admission - 03/02/24 1424       Core Components/Risk Factors/Patient Goals on Admission    Weight Management Yes;Obesity;Weight Loss    Intervention Weight Management: Provide education and appropriate resources to help participant work on and attain dietary goals.;Weight Management: Develop a combined nutrition and exercise program designed to reach desired caloric intake, while maintaining appropriate intake of nutrient and fiber, sodium and fats, and appropriate energy expenditure required for the weight goal.;Weight Management/Obesity: Establish reasonable short term and long term weight goals.;Obesity: Provide education and appropriate resources to help participant work  on and attain dietary goals.    Expected Outcomes Short Term: Continue to assess and modify interventions until short term weight is achieved;Long Term: Adherence to nutrition and physical activity/exercise program aimed toward attainment of established weight goal;Weight Loss: Understanding of general recommendations for a balanced deficit meal plan, which promotes 1-2 lb weight loss per week and includes a negative energy balance of [phone] kcal/d;Understanding recommendations for meals to include 15-35% energy as protein, 25-35% energy from fat, 35-60% energy from carbohydrates, less than 200mg  of dietary cholesterol, 20-35 gm of total fiber daily;Understanding of distribution of calorie intake throughout the day with the consumption of 4-5 meals/snacks    Heart Failure Yes    Intervention Provide a combined exercise and nutrition program that is supplemented with education, support and counseling about heart failure. Directed toward relieving symptoms such as shortness of breath, decreased exercise tolerance, and extremity edema.    Expected Outcomes Improve functional capacity of life;Short term: Attendance in program 2-3 days a week with increased exercise  capacity. Reported lower sodium intake. Reported increased fruit and vegetable intake. Reports medication compliance.;Long term: Adoption of self-care skills and reduction of barriers for early signs and symptoms recognition and intervention leading to self-care maintenance.;Short term: Daily weights obtained and reported for increase. Utilizing diuretic protocols set by physician.    Hypertension Yes    Intervention Provide education on lifestyle modifcations including regular physical activity/exercise, weight management, moderate sodium restriction and increased consumption of fresh fruit, vegetables, and low fat dairy, alcohol  moderation, and smoking cessation.;Monitor prescription use compliance.    Expected Outcomes Short Term: Continued assessment and intervention until BP is < 140/42mm HG in hypertensive participants. < 130/71mm HG in hypertensive participants with diabetes, heart failure or chronic kidney disease.;Long Term: Maintenance of blood pressure at goal levels.    Lipids Yes    Intervention Provide education and support for participant on nutrition & aerobic/resistive exercise along with prescribed medications to achieve LDL 70mg , HDL >40mg .    Expected Outcomes Short Term: Participant states understanding of desired cholesterol values and is compliant with medications prescribed. Participant is following exercise prescription and nutrition guidelines.;Long Term: Cholesterol controlled with medications as prescribed, with individualized exercise RX and with personalized nutrition plan. Value goals: LDL < 70mg , HDL > 40 mg.    Stress Yes    Intervention Offer individual and/or small group education and counseling on adjustment to heart disease, stress management and health-related lifestyle change. Teach and support self-help strategies.;Refer participants experiencing significant psychosocial distress to appropriate mental health specialists for further evaluation and treatment. When  possible, include family members and significant others in education/counseling sessions.    Expected Outcomes Short Term: Participant demonstrates changes in health-related behavior, relaxation and other stress management skills, ability to obtain effective social support, and compliance with psychotropic medications if prescribed.;Long Term: Emotional wellbeing is indicated by absence of clinically significant psychosocial distress or social isolation.          Core Components/Risk Factors/Patient Goals Review:    Core Components/Risk Factors/Patient Goals at Discharge (Final Review):    ITP Comments:  ITP Comments     Row Name 03/02/24 1427           ITP Comments Tamala Fair, MD:  Medical Director.  Introduction to the [facility].  Initial orientation packet reviewed with the patient.          Comments: Participant attended orientation for the cardiac rehabilitation program on  03/02/2024  to  perform initial intake and exercise walk test. Patient introduced to the [facility] education and orientation packet was reviewed. Completed 6-minute walk test, measurements, initial ITP, and exercise prescription. Vital signs stable. Telemetry-atrial paced rhythm, occasional PVC's, asymptomatic.   Service time was from 1326 to 1521.

## 2024-03-08 ENCOUNTER — Encounter (HOSPITAL_COMMUNITY)
Admission: RE | Admit: 2024-03-08 | Discharge: 2024-03-08 | Disposition: A | Discharge status: Home / Self Care | Source: Ambulatory Visit | Attending: Cardiovascular Disease

## 2024-03-08 DIAGNOSIS — I5022 Chronic systolic (congestive) heart failure: Secondary | ICD-10-CM | POA: Diagnosis not present

## 2024-03-08 NOTE — Progress Notes (Signed)
 Daily Session Note  Patient Details  Name: John [name] MRN: [id number] Date of Birth: 1961/02/24 Referring Provider:   Flowsheet Row INTENSIVE CARDIAC REHAB ORIENT from 03/02/2024 in [facility]  Referring Provider Mihai Croitoru    Encounter Date: 03/08/2024  Check In:  Session Check In - 03/08/24 1333       Check-In   Supervising physician immediately available to respond to emergencies [facility] MD immediately available    Physician(s) Josefa Beauvais, NP    Location [facility]    Staff Present Alm Parkins, MS, ACSM-CEP, CCRP, Exercise Physiologist;Johnny Fayette, MS, Exercise Physiologist;Olinty Valere, MS, ACSM-CEP, Exercise Physiologist;Jetta Vannie BS, ACSM-CEP, Exercise Physiologist;Casey Claudene West Quan, RN, BSN    Virtual Visit No    Medication changes reported     No    Fall or balance concerns reported    No    Tobacco Cessation No Change    Warm-up and Cool-down Performed as group-led instruction   [facility] orientation today   Resistance Training Performed Yes    VAD Patient? No    PAD/SET Patient? No      Pain Assessment   Currently in Pain? No/denies    Pain Score 0-No pain    Multiple Pain Sites No          Capillary Blood Glucose: No results found. However, due to the size of the patient record, not all encounters were searched. Please check Results Review for a complete set of results.   Exercise Prescription Changes - 03/08/24 1600       Response to Exercise   Blood Pressure (Admit) 113/69    Blood Pressure (Exercise) 130/74    Blood Pressure (Exit) 96/60    Heart Rate (Admit) 77 bpm    Heart Rate (Exercise) 94 bpm    Heart Rate (Exit) 67 bpm    Rating of Perceived Exertion (Exercise) 13    Symptoms None    Comments Pt's first day in the [facility] program    Duration Continue with 30 min of aerobic exercise without signs/symptoms of physical distress.    Intensity  THRR unchanged      Progression   Progression Continue to progress workloads to maintain intensity without signs/symptoms of physical distress.    Average METs 2.2      Resistance Training   Training Prescription Yes    Weight 3lbs    Reps 10-15    Time 5 Minutes      Interval Training   Interval Training No      Recumbant Bike   Level 1    RPM 68    Watts 19    Minutes 15    METs 2.1      NuStep   Level 2    SPM 93    Minutes 15    METs 2.3          Social History   Tobacco Use  Smoking Status Never  Smokeless Tobacco Never    Goals Met:  Exercise tolerated well No report of concerns or symptoms today Strength training completed today  Goals Unmet:  Not Applicable  Comments: Pt started cardiac rehab today.  Pt tolerated light exercise without difficulty. VSS, telemetry-A paced , asymptomatic.  Medication list reconciled. Pt denies barriers to medicaiton compliance.  PSYCHOSOCIAL ASSESSMENT:  PHQ-8. Pt exhibits positive coping skills, hopeful outlook with supportive family. Will review PHQ2-9 and quality of life reviewed.    Pt  enjoys swimming and sports.   Pt oriented to exercise equipment and routine.  Understanding verbalized.Hadassah Elpidio Quan RN BSN    Dr. Wilbert Bihari is Medical Director for Cardiac Rehab at [facility].

## 2024-03-10 ENCOUNTER — Encounter (HOSPITAL_COMMUNITY)

## 2024-03-12 ENCOUNTER — Encounter (HOSPITAL_COMMUNITY)

## 2024-03-15 ENCOUNTER — Ambulatory Visit: Payer: Self-pay

## 2024-03-15 ENCOUNTER — Encounter (HOSPITAL_COMMUNITY)
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
## 2024-03-16 ENCOUNTER — Ambulatory Visit

## 2024-03-16 ENCOUNTER — Ambulatory Visit: Admitting: Family Medicine

## 2024-03-17 ENCOUNTER — Encounter (HOSPITAL_COMMUNITY)

## 2024-03-17 ENCOUNTER — Ambulatory Visit: Admitting: Internal Medicine

## 2024-03-18 ENCOUNTER — Ambulatory Visit: Admitting: Family

## 2024-03-22 ENCOUNTER — Encounter (HOSPITAL_COMMUNITY): Admission: RE | Admit: 2024-03-22 | Source: Ambulatory Visit

## 2024-03-22 ENCOUNTER — Telehealth (HOSPITAL_COMMUNITY): Payer: Self-pay

## 2024-03-22 NOTE — Telephone Encounter (Signed)
 Attempted to call patient regarding cardiac rehab- no answer, left message stating nurse Hadassah would like him to obtain clearance from his doctor to return to cardiac rehab.

## 2024-03-22 NOTE — Telephone Encounter (Signed)
 Patient c/o for 12:30pm CR class due to having a f/u appt for cataract surgery today. He is unsure if he needs medical clearance to return to cardiac rehab.

## 2024-03-23 ENCOUNTER — Telehealth (HOSPITAL_COMMUNITY): Payer: Self-pay

## 2024-03-23 NOTE — Telephone Encounter (Signed)
 Patient left message stating he has a doctor's note allowing him to return to cardiac rehab on 7/16 as he needs another week to heal from his cataract surgery. Cancelled appts between tomorrow and 7/16.

## 2024-03-24 ENCOUNTER — Encounter (HOSPITAL_COMMUNITY)

## 2024-03-26 ENCOUNTER — Encounter (HOSPITAL_COMMUNITY)

## 2024-03-29 ENCOUNTER — Encounter (HOSPITAL_COMMUNITY)

## 2024-03-30 ENCOUNTER — Encounter (HOSPITAL_COMMUNITY): Payer: Self-pay | Admitting: *Deleted

## 2024-03-30 DIAGNOSIS — I5022 Chronic systolic (congestive) heart failure: Secondary | ICD-10-CM

## 2024-03-30 NOTE — Progress Notes (Signed)
 Cardiac Individual Treatment Plan  Patient Details  Name: John [name] MRN: [id number] Date of Birth: 07-Jun-1961 Referring Provider:   Flowsheet Row INTENSIVE CARDIAC REHAB ORIENT from 03/02/2024 in [facility]  Referring Provider Mihai Croitoru    Initial Encounter Date:  Flowsheet Row INTENSIVE CARDIAC REHAB ORIENT from 03/02/2024 in [facility]  Date 03/02/24    Visit Diagnosis: Heart failure, chronic systolic (HCC)  Patient's Home Medications on Admission:  Current Outpatient Medications:    acetaminophen  (TYLENOL ) 500 MG tablet, Take 1,000 mg by mouth daily., Disp: , Rfl:    apixaban  (ELIQUIS ) 5 MG TABS tablet, Take 1 tablet (5 mg total) by mouth 2 (two) times daily., Disp: 28 tablet, Rfl: 0   bismuth subsalicylate (PEPTO BISMOL) 262 MG/15ML suspension, Take 30 mLs by mouth every 6 (six) hours as needed for diarrhea or loose stools., Disp: , Rfl:    carvedilol  (COREG ) 6.25 MG tablet, Take 1 tablet (6.25 mg total) by mouth 2 (two) times daily with a meal., Disp: 180 tablet, Rfl: 3   dapagliflozin  propanediol (FARXIGA ) 10 MG TABS tablet, TAKE 1 TABLET (10 MG TOTAL) BY MOUTH DAILY. NEEDS FOLLOW UP APPOINTMENT FOR MORE REFILLS, Disp: 90 tablet, Rfl: 3   famotidine -calcium  carbonate-magnesium hydroxide (PEPCID  COMPLETE) 10-800-165 MG chewable tablet, Chew 1 tablet by mouth 2 (two) times daily as needed., Disp: 100 tablet, Rfl: 11   gatifloxacin (ZYMAXID) 0.5 % SOLN, Place 1 drop into the left eye 4 (four) times daily., Disp: , Rfl:    ipratropium-albuterol  (DUONEB) 0.5-2.5 (3) MG/3ML SOLN, Take 3 mLs by nebulization every 4 (four) hours as needed., Disp: 30 mL, Rfl: 1   ketorolac  (ACULAR ) 0.5 % ophthalmic solution, Place 1 drop into the left eye 4 (four) times daily., Disp: , Rfl:    meloxicam  (MOBIC ) 15 MG tablet, TAKE 1 TABLET (15 MG TOTAL) BY MOUTH DAILY. (Patient not  taking: Reported on 03/02/2024), Disp: 30 tablet, Rfl: 0   Multiple Vitamin (MULTIVITAMIN WITH MINERALS) TABS tablet, Take 1 tablet by mouth in the morning., Disp: , Rfl:    prednisoLONE acetate (PRED FORTE) 1 % ophthalmic suspension, Place 1 drop into the left eye 4 (four) times daily., Disp: , Rfl:    rosuvastatin  (CRESTOR ) 20 MG tablet, TAKE 1 TABLET (20 MG TOTAL) BY MOUTH DAILY. NEEDS FOLLOW UP APPOINTMENT FOR MORE REFILLS, Disp: 90 tablet, Rfl: 0   sacubitril -valsartan  (ENTRESTO ) 97-103 MG, Take 1/2 tablet twice a day, Disp: , Rfl:    Semaglutide -Weight Management 0.5 MG/0.5ML SOAJ, Inject 0.5 mg into the skin once a week. (Patient not taking: Reported on 03/02/2024), Disp: , Rfl:    sertraline  (ZOLOFT ) 100 MG tablet, TAKE 1 TABLET BY MOUTH EVERY DAY, Disp: 90 tablet, Rfl: 3   sildenafil  (VIAGRA ) 100 MG tablet, TAKE 1 TABLET (100 MG TOTAL) BY MOUTH AT BEDTIME AS NEEDED FOR ERECTILE DYSFUNCTION., Disp: 90 tablet, Rfl: 3   spironolactone  (ALDACTONE ) 25 MG tablet, Take 1 tablet (25 mg total) by mouth daily. (Patient not taking: Reported on 03/02/2024), Disp: , Rfl:    torsemide  (DEMADEX ) 20 MG tablet, Take 2 tablets (40 mg total) by mouth daily., Disp: 180 tablet, Rfl: 0   traZODone  (DESYREL ) 50 MG tablet, Take 1 tablet (50 mg total) by mouth at bedtime., Disp: 90 tablet, Rfl: 3  Past Medical History: Past Medical History:  Diagnosis Date   AICD (automatic cardioverter/defibrillator) present    St.  Jude   Anxiety    Ativan    Chronic combined systolic and diastolic CHF, NYHA class 3 (HCC) 10/2016   Nonischemic cardiomyopathy. EF 20-25%.   Chronic left hip pain    Depression    history of   Dysrhythmia    A-FIB   GI bleed 03/2016   Headache    Hypertensive heart disease with combined systolic and diastolic congestive heart failure (HCC) 10/2016   Nonischemic cardiomyopathy (HCC) 10/2016   Echo with EF 20-25%. Cardiac catheterization with no CAD. LVEDP was 41 mmHg, PCWP 36 mmHg    Osteoarthritis    right shoulder   Right hand fracture    Seasonal allergies    Sleep apnea    wears a CPAP   Wears glasses     Tobacco Use: Social History   Tobacco Use  Smoking Status Never  Smokeless Tobacco Never    Labs: Review Flowsheet  More data exists      Latest Ref Rng & Units 08/22/2022 02/27/2023 03/06/2023 08/22/2023 02/03/2024  Labs for ITP Cardiac and Pulmonary Rehab  Cholestrol 0 - 200 mg/dL 855  - 829  859  849   LDL (calc) 0 - 99 mg/dL 48  - - 67  65   Direct LDL mg/dL - - 14.9  - -  HDL-C >60.99 mg/dL 44.59  - 51.29  57.09  46.70   Trlycerides 0.0 - 149.0 mg/dL 800.9  - 684.9  851.9  188.0   Hemoglobin A1c 4.6 - 6.5 % 6.0  5.6  - - 5.8     Capillary Blood Glucose: Lab Results  Component Value Date   GLUCAP 115 (H) 09/09/2023   GLUCAP 103 (H) 09/08/2023   GLUCAP 171 (H) 05/04/2021   GLUCAP 138 (H) 05/04/2021   GLUCAP 144 (H) 05/04/2021     Exercise Target Goals: Exercise Program Goal: Individual exercise prescription set using results from initial 6 min walk test and THRR while considering  patient's activity barriers and safety.   Exercise Prescription Goal: Initial exercise prescription builds to 30-45 minutes a day of aerobic activity, 2-3 days per week.  Home exercise guidelines will be given to patient during program as part of exercise prescription that the participant will acknowledge.  Activity Barriers & Risk Stratification:  Activity Barriers & Cardiac Risk Stratification - 03/02/24 1421       Activity Barriers & Cardiac Risk Stratification   Activity Barriers Left Hip Replacement;Right Hip Replacement;Joint Problems;Arthritis;History of Falls;Balance Concerns;Decreased Ventricular Function;Deconditioning;Back Problems;Other (comment)    Comments Left/Right shoulder replacements    Cardiac Risk Stratification High          6 Minute Walk:  6 Minute Walk     Row Name 03/02/24 1505         6 Minute Walk   Phase Initial  Pt  used GO-Cart     Distance 1192 feet     Walk Time 6 minutes     # of Rest Breaks 2  3:07-3:34; 5:16-5:52     MPH 2.26     METS 2.92     RPE 13     Perceived Dyspnea  0     VO2 Peak 10.2     Symptoms Yes (comment)     Comments Low back pain and tightness, 9/10     Resting HR 73 bpm     Resting BP 100/60     Resting Oxygen  Saturation  96 %     Exercise Oxygen  Saturation  during 6 min  walk 97 %     Max Ex. HR 120 bpm     Max Ex. BP 134/78     2 Minute Post BP 120/72        Oxygen  Initial Assessment:   Oxygen  Re-Evaluation:   Oxygen  Discharge (Final Oxygen  Re-Evaluation):   Initial Exercise Prescription:  Initial Exercise Prescription - 03/02/24 1400       Date of Initial Exercise RX and Referring Provider   Date 03/02/24    Referring Provider Mihai Croitoru      Recumbant Bike   Level 1    RPM 60    Watts 34    Minutes 15    METs 2.9      NuStep   Level 2    SPM 75    Minutes 15    METs 2.9      Prescription Details   Frequency (times per week) 3    Duration Progress to 30 minutes of continuous aerobic without signs/symptoms of physical distress      Intensity   THRR 40-80% of Max Heartrate 63-126    Ratings of Perceived Exertion 11-13    Perceived Dyspnea 0-4      Progression   Progression Continue progressive overload as per policy without signs/symptoms or physical distress.      Resistance Training   Training Prescription Yes    Weight 3lbs    Reps 10-15          Perform Capillary Blood Glucose checks as needed.  Exercise Prescription Changes:   Exercise Prescription Changes     Row Name 03/08/24 1600             Response to Exercise   Blood Pressure (Admit) 113/69       Blood Pressure (Exercise) 130/74       Blood Pressure (Exit) 96/60       Heart Rate (Admit) 77 bpm       Heart Rate (Exercise) 94 bpm       Heart Rate (Exit) 67 bpm       Rating of Perceived Exertion (Exercise) 13       Symptoms None       Comments Pt's  first day in the CRP2 program       Duration Continue with 30 min of aerobic exercise without signs/symptoms of physical distress.       Intensity THRR unchanged         Progression   Progression Continue to progress workloads to maintain intensity without signs/symptoms of physical distress.       Average METs 2.2         Resistance Training   Training Prescription Yes       Weight 3lbs       Reps 10-15       Time 5 Minutes         Interval Training   Interval Training No         Recumbant Bike   Level 1       RPM 68       Watts 19       Minutes 15       METs 2.1         NuStep   Level 2       SPM 93       Minutes 15       METs 2.3          Exercise Comments:   Exercise Comments  Row Name 03/08/24 1611 03/22/24 0930         Exercise Comments Pt's first day in the CRP2 program. Pt exercised without complaints. Pt due for 2 week MET review but has only attend 1 session due to being out for eye surgery. Will review when patinet has consistant attendence.         Exercise Goals and Review:   Exercise Goals     Row Name 03/02/24 1422             Exercise Goals   Increase Physical Activity Yes       Intervention Provide advice, education, support and counseling about physical activity/exercise needs.;Develop an individualized exercise prescription for aerobic and resistive training based on initial evaluation findings, risk stratification, comorbidities and participant's personal goals.       Expected Outcomes Short Term: Attend rehab on a regular basis to increase amount of physical activity.;Long Term: Exercising regularly at least 3-5 days a week.;Long Term: Add in home exercise to make exercise part of routine and to increase amount of physical activity.       Increase Strength and Stamina Yes       Intervention Provide advice, education, support and counseling about physical activity/exercise needs.;Develop an individualized exercise prescription for  aerobic and resistive training based on initial evaluation findings, risk stratification, comorbidities and participant's personal goals.       Expected Outcomes Short Term: Increase workloads from initial exercise prescription for resistance, speed, and METs.;Short Term: Perform resistance training exercises routinely during rehab and add in resistance training at home;Long Term: Improve cardiorespiratory fitness, muscular endurance and strength as measured by increased METs and functional capacity ( )       Able to understand and use rate of perceived exertion (RPE) scale Yes       Intervention Provide education and explanation on how to use RPE scale       Expected Outcomes Short Term: Able to use RPE daily in rehab to express subjective intensity level;Long Term:  Able to use RPE to guide intensity level when exercising independently       Knowledge and understanding of Target Heart Rate Range (THRR) Yes       Intervention Provide education and explanation of THRR including how the numbers were predicted and where they are located for reference       Expected Outcomes Short Term: Able to state/look up THRR;Long Term: Able to use THRR to govern intensity when exercising independently;Short Term: Able to use daily as guideline for intensity in rehab       Understanding of Exercise Prescription Yes       Intervention Provide education, explanation, and written materials on patient's individual exercise prescription       Expected Outcomes Short Term: Able to explain program exercise prescription;Long Term: Able to explain home exercise prescription to exercise independently          Exercise Goals Re-Evaluation :  Exercise Goals Re-Evaluation     Row Name 03/08/24 1606             Exercise Goal Re-Evaluation   Exercise Goals Review Increase Physical Activity;Increase Strength and Stamina;Able to understand and use rate of perceived exertion (RPE) scale;Knowledge and understanding of  Target Heart Rate Range (THRR);Understanding of Exercise Prescription       Comments Pt's first day in the CRP2 program. Pt understands the Exercise Rx, RPE scale and THRR.       Expected Outcomes will continue to monitor  patient and progress exericse workloads as tolerated.          Discharge Exercise Prescription (Final Exercise Prescription Changes):  Exercise Prescription Changes - 03/08/24 1600       Response to Exercise   Blood Pressure (Admit) 113/69    Blood Pressure (Exercise) 130/74    Blood Pressure (Exit) 96/60    Heart Rate (Admit) 77 bpm    Heart Rate (Exercise) 94 bpm    Heart Rate (Exit) 67 bpm    Rating of Perceived Exertion (Exercise) 13    Symptoms None    Comments Pt's first day in the CRP2 program    Duration Continue with 30 min of aerobic exercise without signs/symptoms of physical distress.    Intensity THRR unchanged      Progression   Progression Continue to progress workloads to maintain intensity without signs/symptoms of physical distress.    Average METs 2.2      Resistance Training   Training Prescription Yes    Weight 3lbs    Reps 10-15    Time 5 Minutes      Interval Training   Interval Training No      Recumbant Bike   Level 1    RPM 68    Watts 19    Minutes 15    METs 2.1      NuStep   Level 2    SPM 93    Minutes 15    METs 2.3          Nutrition:  Target Goals: Understanding of nutrition guidelines, daily intake of sodium 1500mg , cholesterol 200mg , calories 30% from fat and 7% or less from saturated fats, daily to have 5 or more servings of fruits and vegetables.  Biometrics:  Pre Biometrics - 03/02/24 1405       Pre Biometrics   Waist Circumference 51.5 inches    Hip Circumference 49.5 inches    Waist to Hip Ratio 1.04 %    Triceps Skinfold 36 mm    % Body Fat 38.9 %    Grip Strength 22 kg    Flexibility --   Not performed, low back pain   Single Leg Stand 3 seconds           Nutrition Therapy Plan and  Nutrition Goals:  Nutrition Therapy & Goals - 03/08/24 1606       Nutrition Therapy   Diet Heart Healthy Diet    Drug/Food Interactions Statins/Certain Fruits      Personal Nutrition Goals   Nutrition Goal Patient to identify strategies for reducing cardiovascular risk by attending the Pritikin education and nutrition series weekly.    Personal Goal #2 Patient to improve diet quality by using the plate method as a guide for meal planning to include lean protein/plant protein, fruits, vegetables, whole grains, nonfat dairy as part of a well-balanced diet.    Personal Goal #3 Patient to identify strategies for weight loss of 0.5-2.0# per week.    Comments Bebe has medical history of severe nonischemic cardiomyopathy, paroxysmal atrial fibrillation, polymorphic ventricular tachycardia, aortic atherosclerosis, moderate nonobstructive CAD with severe plaque burden, systolic heart failure, obstructive sleep apnea on CPAP and severe obesity. He has previously completed cardiac rehab in the past. LDL is well controlled. A1c is in a pre-diabetic range and triglycerides remain elevated. Patient will benefit from participation in intensive cardiac rehab for nutrition, exercise, and lifestyle modification.      Intervention Plan   Intervention Prescribe, educate and counsel regarding  individualized specific dietary modifications aiming towards targeted core components such as weight, hypertension, lipid management, diabetes, heart failure and other comorbidities.;Nutrition handout(s) given to patient.    Expected Outcomes Short Term Goal: Understand basic principles of dietary content, such as calories, fat, sodium, cholesterol and nutrients.;Long Term Goal: Adherence to prescribed nutrition plan.          Nutrition Assessments:  MEDIFICTS Score Key: >=70 Need to make dietary changes  40-70 Heart Healthy Diet <= 40 Therapeutic Level Cholesterol Diet    Picture Your Plate Scores: <59 Unhealthy  dietary pattern with much room for improvement. 41-50 Dietary pattern unlikely to meet recommendations for good health and room for improvement. 51-60 More healthful dietary pattern, with some room for improvement.  >60 Healthy dietary pattern, although there may be some specific behaviors that could be improved.    Nutrition Goals Re-Evaluation:  Nutrition Goals Re-Evaluation     Row Name 03/08/24 1606             Goals   Current Weight 252 lb 13.9 oz (114.7 kg)       Comment A1c 5.8, LDL 65, HDL 46, Triglycerides 188       Expected Outcome Charlie has medical history of severe nonischemic cardiomyopathy, paroxysmal atrial fibrillation, polymorphic ventricular tachycardia, aortic atherosclerosis, moderate nonobstructive CAD with severe plaque burden, systolic heart failure, obstructive sleep apnea on CPAP and severe obesity. He has previously completed cardiac rehab in the past. LDL is well controlled. A1c is in a pre-diabetic range and triglycerides remain elevated. Patient will benefit from participation in intensive cardiac rehab for nutrition, exercise, and lifestyle modification.          Nutrition Goals Re-Evaluation:  Nutrition Goals Re-Evaluation     Row Name 03/08/24 1606             Goals   Current Weight 252 lb 13.9 oz (114.7 kg)       Comment A1c 5.8, LDL 65, HDL 46, Triglycerides 188       Expected Outcome Charlie has medical history of severe nonischemic cardiomyopathy, paroxysmal atrial fibrillation, polymorphic ventricular tachycardia, aortic atherosclerosis, moderate nonobstructive CAD with severe plaque burden, systolic heart failure, obstructive sleep apnea on CPAP and severe obesity. He has previously completed cardiac rehab in the past. LDL is well controlled. A1c is in a pre-diabetic range and triglycerides remain elevated. Patient will benefit from participation in intensive cardiac rehab for nutrition, exercise, and lifestyle modification.           Nutrition Goals Discharge (Final Nutrition Goals Re-Evaluation):  Nutrition Goals Re-Evaluation - 03/08/24 1606       Goals   Current Weight 252 lb 13.9 oz (114.7 kg)    Comment A1c 5.8, LDL 65, HDL 46, Triglycerides 188    Expected Outcome Charlie has medical history of severe nonischemic cardiomyopathy, paroxysmal atrial fibrillation, polymorphic ventricular tachycardia, aortic atherosclerosis, moderate nonobstructive CAD with severe plaque burden, systolic heart failure, obstructive sleep apnea on CPAP and severe obesity. He has previously completed cardiac rehab in the past. LDL is well controlled. A1c is in a pre-diabetic range and triglycerides remain elevated. Patient will benefit from participation in intensive cardiac rehab for nutrition, exercise, and lifestyle modification.          Psychosocial: Target Goals: Acknowledge presence or absence of significant depression and/or stress, maximize coping skills, provide positive support system. Participant is able to verbalize types and ability to use techniques and skills needed for reducing stress and depression.  Initial  Review & Psychosocial Screening:  Initial Psych Review & Screening - 03/02/24 1416       Initial Review   Current issues with Current Depression;Current Sleep Concerns;Current Stress Concerns    Source of Stress Concerns Family    Comments Stress mainly regarding chlidren      Family Dynamics   Good Support System? Yes   Has sposue for support     Barriers   Psychosocial barriers to participate in program The patient should benefit from training in stress management and relaxation.      Screening Interventions   Interventions Encouraged to exercise    Expected Outcomes Short Term goal: Identification and review with participant of any Quality of Life or Depression concerns found by scoring the questionnaire.;Long Term goal: The participant improves quality of Life and PHQ9 Scores as seen by post scores  and/or verbalization of changes;Short Term goal: Utilizing psychosocial counselor, staff and physician to assist with identification of specific Stressors or current issues interfering with healing process. Setting desired goal for each stressor or current issue identified.;Long Term Goal: Stressors or current issues are controlled or eliminated.          Quality of Life Scores:  Quality of Life - 03/02/24 1545       Quality of Life   Select Quality of Life      Quality of Life Scores   Health/Function Pre 19.67 %    Socioeconomic Pre 26.64 %    Psych/Spiritual Pre 21.57 %    Family Pre 28.8 %    GLOBAL Pre 22.84 %         Scores of 19 and below usually indicate a poorer quality of life in these areas.  A difference of  2-3 points is a clinically meaningful difference.  A difference of 2-3 points in the total score of the Quality of Life Index has been associated with significant improvement in overall quality of life, self-image, physical symptoms, and general health in studies assessing change in quality of life.  PHQ-9: Review Flowsheet  More data exists      03/02/2024 11/13/2023 05/09/2023 02/18/2023 08/22/2022  Depression screen PHQ 2/9  Decreased Interest 2 0 0 0 0  Down, Depressed, Hopeless 1 0 0 0 0  PHQ - 2 Score 3 0 0 0 0  Altered sleeping 2 - - - 0  Tired, decreased energy 2 - - - 0  Change in appetite 0 - - - 0  Feeling bad or failure about yourself  1 - - - 0  Trouble concentrating 0 - - - 0  Moving slowly or fidgety/restless 0 - - - 0  Suicidal thoughts 0 - - - 0  PHQ-9 Score 8 - - - 0  Difficult doing work/chores Not difficult at all - - - -   Interpretation of Total Score  Total Score Depression Severity:  1-4 = Minimal depression, 5-9 = Mild depression, 10-14 = Moderate depression, 15-19 = Moderately severe depression, 20-27 = Severe depression   Psychosocial Evaluation and Intervention:   Psychosocial Re-Evaluation:  Psychosocial Re-Evaluation      Row Name 03/17/24 1728 03/30/24 1422           Psychosocial Re-Evaluation   Current issues with Current Depression;Current Sleep Concerns;Current Stress Concerns Current Depression;Current Sleep Concerns;Current Stress Concerns      Comments Charlie did not voice any increased concerns or stressors on his first day of exercise. Will review quality of life questionnaire and PHQ9 when the patient  returns to exercise Will review quality of life questionnaire and PHQ9 when the patient returns to exercise. Patient's last day of  exercise was on 03/08/24      Expected Outcomes Charlie will have decreased or controlled depression upon completion of cardiac rehab Bebe will have decreased or controlled depression upon completion of cardiac rehab      Interventions Stress management education;Encouraged to attend Cardiac Rehabilitation for the exercise;Relaxation education Stress management education;Encouraged to attend Cardiac Rehabilitation for the exercise;Relaxation education      Continue Psychosocial Services  Follow up required by staff Follow up required by staff        Initial Review   Source of Stress Concerns Chronic Illness;Family Chronic Illness;Family      Comments Will cintinue to monitor and offer support as needed. Will cintinue to monitor and offer support as needed.         Psychosocial Discharge (Final Psychosocial Re-Evaluation):  Psychosocial Re-Evaluation - 03/30/24 1422       Psychosocial Re-Evaluation   Current issues with Current Depression;Current Sleep Concerns;Current Stress Concerns    Comments Will review quality of life questionnaire and PHQ9 when the patient returns to exercise. Patient's last day of  exercise was on 03/08/24    Expected Outcomes Charlie will have decreased or controlled depression upon completion of cardiac rehab    Interventions Stress management education;Encouraged to attend Cardiac Rehabilitation for the exercise;Relaxation education     Continue Psychosocial Services  Follow up required by staff      Initial Review   Source of Stress Concerns Chronic Illness;Family    Comments Will cintinue to monitor and offer support as needed.          Vocational Rehabilitation: Provide vocational rehab assistance to qualifying candidates.   Vocational Rehab Evaluation & Intervention:  Vocational Rehab - 03/02/24 1424       Initial Vocational Rehab Evaluation & Intervention   Assessment shows need for Vocational Rehabilitation No   Pt is retired         Education: Education Goals: Education classes will be provided on a weekly basis, covering required topics. Participant will state understanding/return demonstration of topics presented.    Education     Row Name 03/08/24 1400     Education   Cardiac Education Topics --   Select --     Core Videos   Educator --   Select --   Exercise Education --   Instruction Review Code --      Core Videos: Exercise    Move It!  Clinical staff conducted group or individual video education with verbal and written material and guidebook.  Patient learns the recommended Pritikin exercise program. Exercise with the goal of living a long, healthy life. Some of the health benefits of exercise include controlled diabetes, healthier blood pressure levels, improved cholesterol levels, improved heart and lung capacity, improved sleep, and better body composition. Everyone should speak with their doctor before starting or changing an exercise routine.  Biomechanical Limitations Clinical staff conducted group or individual video education with verbal and written material and guidebook.  Patient learns how biomechanical limitations can impact exercise and how we can mitigate and possibly overcome limitations to have an impactful and balanced exercise routine.  Body Composition Clinical staff conducted group or individual video education with verbal and written material and guidebook.   Patient learns that body composition (ratio of muscle mass to fat mass) is a key component to assessing overall fitness, rather than body  weight alone. Increased fat mass, especially visceral belly fat, can put us  at increased risk for metabolic syndrome, type 2 diabetes, heart disease, and even death. It is recommended to combine diet and exercise (cardiovascular and resistance training) to improve your body composition. Seek guidance from your physician and exercise physiologist before implementing an exercise routine.  Exercise Action Plan Clinical staff conducted group or individual video education with verbal and written material and guidebook.  Patient learns the recommended strategies to achieve and enjoy long-term exercise adherence, including variety, self-motivation, self-efficacy, and positive decision making. Benefits of exercise include fitness, good health, weight management, more energy, better sleep, less stress, and overall well-being.  Medical   Heart Disease Risk Reduction Clinical staff conducted group or individual video education with verbal and written material and guidebook.  Patient learns our heart is our most vital organ as it circulates oxygen , nutrients, white blood cells, and hormones throughout the entire body, and carries waste away. Data supports a plant-based eating plan like the Pritikin Program for its effectiveness in slowing progression of and reversing heart disease. The video provides a number of recommendations to address heart disease.   Metabolic Syndrome and Belly Fat  Clinical staff conducted group or individual video education with verbal and written material and guidebook.  Patient learns what metabolic syndrome is, how it leads to heart disease, and how one can reverse it and keep it from coming back. You have metabolic syndrome if you have 3 of the following 5 criteria: abdominal obesity, high blood pressure, high triglycerides, low HDL cholesterol, and  high blood sugar.  Hypertension and Heart Disease Clinical staff conducted group or individual video education with verbal and written material and guidebook.  Patient learns that high blood pressure, or hypertension, is very common in the [place] . Hypertension is largely due to excessive salt intake, but other important risk factors include being overweight, physical inactivity, drinking too much alcohol , smoking, and not eating enough potassium from fruits and vegetables. High blood pressure is a leading risk factor for heart attack, stroke, congestive heart failure, dementia, kidney failure, and premature death. Long-term effects of excessive salt intake include stiffening of the arteries and thickening of heart muscle and organ damage. Recommendations include ways to reduce hypertension and the risk of heart disease.  Diseases of Our Time - Focusing on Diabetes Clinical staff conducted group or individual video education with verbal and written material and guidebook.  Patient learns why the best way to stop diseases of our time is prevention, through food and other lifestyle changes. Medicine (such as prescription pills and surgeries) is often only a Band-Aid on the problem, not a long-term solution. Most common diseases of our time include obesity, type 2 diabetes, hypertension, heart disease, and cancer. The Pritikin Program is recommended and has been proven to help reduce, reverse, and/or prevent the damaging effects of metabolic syndrome.  Nutrition   Overview of the Pritikin Eating Plan  Clinical staff conducted group or individual video education with verbal and written material and guidebook.  Patient learns about the Pritikin Eating Plan for disease risk reduction. The Pritikin Eating Plan emphasizes a wide variety of unrefined, minimally-processed carbohydrates, like fruits, vegetables, whole grains, and legumes. Go, Caution, and Stop food choices are explained. Plant-based and lean  animal proteins are emphasized. Rationale provided for low sodium intake for blood pressure control, low added sugars for blood sugar stabilization, and low added fats and oils for coronary artery disease risk reduction and weight management.  Calorie Density  Clinical staff conducted group or individual video education with verbal and written material and guidebook.  Patient learns about calorie density and how it impacts the Pritikin Eating Plan. Knowing the characteristics of the food you choose will help you decide whether those foods will lead to weight gain or weight loss, and whether you want to consume more or less of them. Weight loss is usually a side effect of the Pritikin Eating Plan because of its focus on low calorie-dense foods.  Label Reading  Clinical staff conducted group or individual video education with verbal and written material and guidebook.  Patient learns about the Pritikin recommended label reading guidelines and corresponding recommendations regarding calorie density, added sugars, sodium content, and whole grains.  Dining Out - Part 1  Clinical staff conducted group or individual video education with verbal and written material and guidebook.  Patient learns that restaurant meals can be sabotaging because they can be so high in calories, fat, sodium, and/or sugar. Patient learns recommended strategies on how to positively address this and avoid unhealthy pitfalls.  Facts on Fats  Clinical staff conducted group or individual video education with verbal and written material and guidebook.  Patient learns that lifestyle modifications can be just as effective, if not more so, as many medications for lowering your risk of heart disease. A Pritikin lifestyle can help to reduce your risk of inflammation and atherosclerosis (cholesterol build-up, or plaque, in the artery walls). Lifestyle interventions such as dietary choices and physical activity address the cause of  atherosclerosis. A review of the types of fats and their impact on blood cholesterol levels, along with dietary recommendations to reduce fat intake is also included.  Nutrition Action Plan  Clinical staff conducted group or individual video education with verbal and written material and guidebook.  Patient learns how to incorporate Pritikin recommendations into their lifestyle. Recommendations include planning and keeping personal health goals in mind as an important part of their success.  Healthy Mind-Set    Healthy Minds, Bodies, Hearts  Clinical staff conducted group or individual video education with verbal and written material and guidebook.  Patient learns how to identify when they are stressed. Video will discuss the impact of that stress, as well as the many benefits of stress management. Patient will also be introduced to stress management techniques. The way we think, act, and feel has an impact on our hearts.  How Our Thoughts Can Heal Our Hearts  Clinical staff conducted group or individual video education with verbal and written material and guidebook.  Patient learns that negative thoughts can cause depression and anxiety. This can result in negative lifestyle behavior and serious health problems. Cognitive behavioral therapy is an effective method to help control our thoughts in order to change and improve our emotional outlook.  Additional Videos:  Exercise    Improving Performance  Clinical staff conducted group or individual video education with verbal and written material and guidebook.  Patient learns to use a non-linear approach by alternating intensity levels and lengths of time spent exercising to help burn more calories and lose more body fat. Cardiovascular exercise helps improve heart health, metabolism, hormonal balance, blood sugar control, and recovery from fatigue. Resistance training improves strength, endurance, balance, coordination, reaction time, metabolism,  and muscle mass. Flexibility exercise improves circulation, posture, and balance. Seek guidance from your physician and exercise physiologist before implementing an exercise routine and learn your capabilities and proper form for all exercise.  Introduction to Yoga  Clinical staff conducted group or individual video education with verbal and written material and guidebook.  Patient learns about yoga, a discipline of the coming together of mind, breath, and body. The benefits of yoga include improved flexibility, improved range of motion, better posture and core strength, increased lung function, weight loss, and positive self-image. Yoga's heart health benefits include lowered blood pressure, healthier heart rate, decreased cholesterol and triglyceride levels, improved immune function, and reduced stress. Seek guidance from your physician and exercise physiologist before implementing an exercise routine and learn your capabilities and proper form for all exercise.  Medical   Aging: Enhancing Your Quality of Life  Clinical staff conducted group or individual video education with verbal and written material and guidebook.  Patient learns key strategies and recommendations to stay in good physical health and enhance quality of life, such as prevention strategies, having an advocate, securing a Health Care Proxy and Power of Attorney, and keeping a list of medications and system for tracking them. It also discusses how to avoid risk for bone loss.  Biology of Weight Control  Clinical staff conducted group or individual video education with verbal and written material and guidebook.  Patient learns that weight gain occurs because we consume more calories than we burn (eating more, moving less). Even if your body weight is normal, you may have higher ratios of fat compared to muscle mass. Too much body fat puts you at increased risk for cardiovascular disease, heart attack, stroke, type 2 diabetes, and  obesity-related cancers. In addition to exercise, following the Pritikin Eating Plan can help reduce your risk.  Decoding Lab Results  Clinical staff conducted group or individual video education with verbal and written material and guidebook.  Patient learns that lab test reflects one measurement whose values change over time and are influenced by many factors, including medication, stress, sleep, exercise, food, hydration, pre-existing medical conditions, and more. It is recommended to use the knowledge from this video to become more involved with your lab results and evaluate your numbers to speak with your doctor.   Diseases of Our Time - Overview  Clinical staff conducted group or individual video education with verbal and written material and guidebook.  Patient learns that according to the CDC, 50% to 70% of chronic diseases (such as obesity, type 2 diabetes, elevated lipids, hypertension, and heart disease) are avoidable through lifestyle improvements including healthier food choices, listening to satiety cues, and increased physical activity.  Sleep Disorders Clinical staff conducted group or individual video education with verbal and written material and guidebook.  Patient learns how good quality and duration of sleep are important to overall health and well-being. Patient also learns about sleep disorders and how they impact health along with recommendations to address them, including discussing with a physician.  Nutrition  Dining Out - Part 2 Clinical staff conducted group or individual video education with verbal and written material and guidebook.  Patient learns how to plan ahead and communicate in order to maximize their dining experience in a healthy and nutritious manner. Included are recommended food choices based on the type of restaurant the patient is visiting.   Fueling a Banker conducted group or individual video education with verbal and written  material and guidebook.  There is a strong connection between our food choices and our health. Diseases like obesity and type 2 diabetes are very prevalent and are in large-part due to lifestyle choices. The Pritikin Eating Plan provides plenty of food  and hunger-curbing satisfaction. It is easy to follow, affordable, and helps reduce health risks.  Menu Workshop  Clinical staff conducted group or individual video education with verbal and written material and guidebook.  Patient learns that restaurant meals can sabotage health goals because they are often packed with calories, fat, sodium, and sugar. Recommendations include strategies to plan ahead and to communicate with the manager, chef, or server to help order a healthier meal.  Planning Your Eating Strategy  Clinical staff conducted group or individual video education with verbal and written material and guidebook.  Patient learns about the Pritikin Eating Plan and its benefit of reducing the risk of disease. The Pritikin Eating Plan does not focus on calories. Instead, it emphasizes high-quality, nutrient-rich foods. By knowing the characteristics of the foods, we choose, we can determine their calorie density and make informed decisions.  Targeting Your Nutrition Priorities  Clinical staff conducted group or individual video education with verbal and written material and guidebook.  Patient learns that lifestyle habits have a tremendous impact on disease risk and progression. This video provides eating and physical activity recommendations based on your personal health goals, such as reducing LDL cholesterol, losing weight, preventing or controlling type 2 diabetes, and reducing high blood pressure.  Vitamins and Minerals  Clinical staff conducted group or individual video education with verbal and written material and guidebook.  Patient learns different ways to obtain key vitamins and minerals, including through a recommended healthy diet.  It is important to discuss all supplements you take with your doctor.   Healthy Mind-Set    Smoking Cessation  Clinical staff conducted group or individual video education with verbal and written material and guidebook.  Patient learns that cigarette smoking and tobacco addiction pose a serious health risk which affects millions of people. Stopping smoking will significantly reduce the risk of heart disease, lung disease, and many forms of cancer. Recommended strategies for quitting are covered, including working with your doctor to develop a successful plan.  Culinary   Becoming a Set designer conducted group or individual video education with verbal and written material and guidebook.  Patient learns that cooking at home can be healthy, cost-effective, quick, and puts them in control. Keys to cooking healthy recipes will include looking at your recipe, assessing your equipment needs, planning ahead, making it simple, choosing cost-effective seasonal ingredients, and limiting the use of added fats, salts, and sugars.  Cooking - Breakfast and Snacks  Clinical staff conducted group or individual video education with verbal and written material and guidebook.  Patient learns how important breakfast is to satiety and nutrition through the entire day. Recommendations include key foods to eat during breakfast to help stabilize blood sugar levels and to prevent overeating at meals later in the day. Planning ahead is also a key component.  Cooking - Educational psychologist conducted group or individual video education with verbal and written material and guidebook.  Patient learns eating strategies to improve overall health, including an approach to cook more at home. Recommendations include thinking of animal protein as a side on your plate rather than center stage and focusing instead on lower calorie dense options like vegetables, fruits, whole grains, and plant-based  proteins, such as beans. Making sauces in large quantities to freeze for later and leaving the skin on your vegetables are also recommended to maximize your experience.  Cooking - Healthy Salads and Dressing Clinical staff conducted group or individual video education with verbal  and written material and guidebook.  Patient learns that vegetables, fruits, whole grains, and legumes are the foundations of the Pritikin Eating Plan. Recommendations include how to incorporate each of these in flavorful and healthy salads, and how to create homemade salad dressings. Proper handling of ingredients is also covered. Cooking - Soups and [employer] - Soups and Desserts Clinical staff conducted group or individual video education with verbal and written material and guidebook.  Patient learns that Pritikin soups and desserts make for easy, nutritious, and delicious snacks and meal components that are low in sodium, fat, sugar, and calorie density, while high in vitamins, minerals, and filling fiber. Recommendations include simple and healthy ideas for soups and desserts.   Overview     The Pritikin Solution Program Overview Clinical staff conducted group or individual video education with verbal and written material and guidebook.  Patient learns that the results of the Pritikin Program have been documented in more than 100 articles published in peer-reviewed journals, and the benefits include reducing risk factors for (and, in some cases, even reversing) high cholesterol, high blood pressure, type 2 diabetes, obesity, and more! An overview of the three key pillars of the Pritikin Program will be covered: eating well, doing regular exercise, and having a healthy mind-set.  WORKSHOPS  Exercise: Exercise Basics: Building Your Action Plan Clinical staff led group instruction and group discussion with PowerPoint presentation and patient guidebook. To enhance the learning environment the use of posters,  models and videos may be added. At the conclusion of this workshop, patients will comprehend the difference between physical activity and exercise, as well as the benefits of incorporating both, into their routine. Patients will understand the FITT (Frequency, Intensity, Time, and Type) principle and how to use it to build an exercise action plan. In addition, safety concerns and other considerations for exercise and cardiac rehab will be addressed by the presenter. The purpose of this lesson is to promote a comprehensive and effective weekly exercise routine in order to improve patients' overall level of fitness.   Managing Heart Disease: Your Path to a Healthier Heart Clinical staff led group instruction and group discussion with PowerPoint presentation and patient guidebook. To enhance the learning environment the use of posters, models and videos may be added.At the conclusion of this workshop, patients will understand the anatomy and physiology of the heart. Additionally, they will understand how Pritikin's three pillars impact the risk factors, the progression, and the management of heart disease.  The purpose of this lesson is to provide a high-level overview of the heart, heart disease, and how the Pritikin lifestyle positively impacts risk factors.  Exercise Biomechanics Clinical staff led group instruction and group discussion with PowerPoint presentation and patient guidebook. To enhance the learning environment the use of posters, models and videos may be added. Patients will learn how the structural parts of their bodies function and how these functions impact their daily activities, movement, and exercise. Patients will learn how to promote a neutral spine, learn how to manage pain, and identify ways to improve their physical movement in order to promote healthy living. The purpose of this lesson is to expose patients to common physical limitations that impact physical activity.  Participants will learn practical ways to adapt and manage aches and pains, and to minimize their effect on regular exercise. Patients will learn how to maintain good posture while sitting, walking, and lifting.  Balance Training and Fall Prevention  Clinical staff led group instruction and group discussion  with PowerPoint presentation and patient guidebook. To enhance the learning environment the use of posters, models and videos may be added. At the conclusion of this workshop, patients will understand the importance of their sensorimotor skills (vision, proprioception, and the vestibular system) in maintaining their ability to balance as they age. Patients will apply a variety of balancing exercises that are appropriate for their current level of function. Patients will understand the common causes for poor balance, possible solutions to these problems, and ways to modify their physical environment in order to minimize their fall risk. The purpose of this lesson is to teach patients about the importance of maintaining balance as they age and ways to minimize their risk of falling.  WORKSHOPS   Nutrition:  Fueling a Ship broker led group instruction and group discussion with PowerPoint presentation and patient guidebook. To enhance the learning environment the use of posters, models and videos may be added. Patients will review the foundational principles of the Pritikin Eating Plan and understand what constitutes a serving size in each of the food groups. Patients will also learn Pritikin-friendly foods that are better choices when away from home and review make-ahead meal and snack options. Calorie density will be reviewed and applied to three nutrition priorities: weight maintenance, weight loss, and weight gain. The purpose of this lesson is to reinforce (in a group setting) the key concepts around what patients are recommended to eat and how to apply these guidelines when away  from home by planning and selecting Pritikin-friendly options. Patients will understand how calorie density may be adjusted for different weight management goals.  Mindful Eating  Clinical staff led group instruction and group discussion with PowerPoint presentation and patient guidebook. To enhance the learning environment the use of posters, models and videos may be added. Patients will briefly review the concepts of the Pritikin Eating Plan and the importance of low-calorie dense foods. The concept of mindful eating will be introduced as well as the importance of paying attention to internal hunger signals. Triggers for non-hunger eating and techniques for dealing with triggers will be explored. The purpose of this lesson is to provide patients with the opportunity to review the basic principles of the Pritikin Eating Plan, discuss the value of eating mindfully and how to measure internal cues of hunger and fullness using the Hunger Scale. Patients will also discuss reasons for non-hunger eating and learn strategies to use for controlling emotional eating.  Targeting Your Nutrition Priorities Clinical staff led group instruction and group discussion with PowerPoint presentation and patient guidebook. To enhance the learning environment the use of posters, models and videos may be added. Patients will learn how to determine their genetic susceptibility to disease by reviewing their family history. Patients will gain insight into the importance of diet as part of an overall healthy lifestyle in mitigating the impact of genetics and other environmental insults. The purpose of this lesson is to provide patients with the opportunity to assess their personal nutrition priorities by looking at their family history, their own health history and current risk factors. Patients will also be able to discuss ways of prioritizing and modifying the Pritikin Eating Plan for their highest risk areas  Menu  Clinical  staff led group instruction and group discussion with PowerPoint presentation and patient guidebook. To enhance the learning environment the use of posters, models and videos may be added. Using menus brought in from [employer], or printed from [employer], patients will apply the [facility] dining  out guidelines that were presented in the [employer] video. Patients will also be able to practice these guidelines in a variety of provided scenarios. The purpose of this lesson is to provide patients with the opportunity to practice hands-on learning of the [facility] Dining Out guidelines with actual menus and practice scenarios.  Label Reading Clinical staff led group instruction and group discussion with PowerPoint presentation and patient guidebook. To enhance the learning environment the use of posters, models and videos may be added. Patients will review and discuss the [facility] label reading guidelines presented in [employer]'s Label Reading Educational series video. Using fool labels brought in from local grocery stores and markets, patients will apply the label reading guidelines and determine if the packaged food meet the [facility] guidelines. The purpose of this lesson is to provide patients with the opportunity to review, discuss, and practice hands-on learning of the [facility] Label Reading guidelines with actual packaged food labels. Cooking School  [facility]'s [employer] are designed to teach patients ways to prepare quick, simple, and affordable recipes at home. The importance of nutrition's role in chronic disease risk reduction is reflected in its emphasis in the overall [facility] program. By learning how to prepare essential core [facility] Eating Plan recipes, patients will increase control over what they eat; be able to customize the flavor of foods without the use of added salt, sugar, or fat; and improve the quality of the food they consume. By learning a set of  core recipes which are easily assembled, quickly prepared, and affordable, patients are more likely to prepare more healthy foods at home. These workshops focus on convenient breakfasts, simple entres, side dishes, and desserts which can be prepared with minimal effort and are consistent with nutrition recommendations for cardiovascular risk reduction. Cooking [employer] are taught by a Armed forces logistics/support/administrative officer (RD) who has been trained by the [employer]. The chef or RD has a clear understanding of the importance of minimizing - if not completely eliminating - added fat, sugar, and sodium in recipes. Throughout the series of Cooking School Workshop sessions, patients will learn about healthy ingredients and efficient methods of cooking to build confidence in their capability to prepare    Cooking School weekly topics:  Adding Flavor- Sodium-Free  Fast and Healthy Breakfasts  Powerhouse Plant-Based Proteins  Satisfying Salads and Dressings  Simple Sides and Sauces  International Cuisine-Spotlight on the [employer] Zones  Delicious Desserts  Savory Soups  [employer] - Meals in a Astronomer Appetizers and Snacks  Comforting Weekend Breakfasts  One-Pot Wonders   Fast Evening Meals  Landscape architect Your Pritikin Plate  WORKSHOPS   Healthy Mindset (Psychosocial):  Focused Goals, Sustainable Changes Clinical staff led group instruction and group discussion with PowerPoint presentation and patient guidebook. To enhance the learning environment the use of posters, models and videos may be added. Patients will be able to apply effective goal setting strategies to establish at least one personal goal, and then take consistent, meaningful action toward that goal. They will learn to identify common barriers to achieving personal goals and develop strategies to overcome them. Patients will also gain an understanding of how our mind-set can impact our ability  to achieve goals and the importance of cultivating a positive and growth-oriented mind-set. The purpose of this lesson is to provide patients with a deeper understanding of how to set and achieve personal goals, as well as the tools and strategies needed to overcome  common obstacles which may arise along the way.  From Head to Heart: The Power of a Healthy Outlook  Clinical staff led group instruction and group discussion with PowerPoint presentation and patient guidebook. To enhance the learning environment the use of posters, models and videos may be added. Patients will be able to recognize and describe the impact of emotions and mood on physical health. They will discover the importance of self-care and explore self-care practices which may work for them. Patients will also learn how to utilize the 4 C's to cultivate a healthier outlook and better manage stress and challenges. The purpose of this lesson is to demonstrate to patients how a healthy outlook is an essential part of maintaining good health, especially as they continue their cardiac rehab journey.  Healthy Sleep for a Healthy Heart Clinical staff led group instruction and group discussion with PowerPoint presentation and patient guidebook. To enhance the learning environment the use of posters, models and videos may be added. At the conclusion of this workshop, patients will be able to demonstrate knowledge of the importance of sleep to overall health, well-being, and quality of life. They will understand the symptoms of, and treatments for, common sleep disorders. Patients will also be able to identify daytime and nighttime behaviors which impact sleep, and they will be able to apply these tools to help manage sleep-related challenges. The purpose of this lesson is to provide patients with a general overview of sleep and outline the importance of quality sleep. Patients will learn about a few of the most common sleep disorders. Patients will  also be introduced to the concept of "sleep hygiene," and discover ways to self-manage certain sleeping problems through simple daily behavior changes. Finally, the workshop will motivate patients by clarifying the links between quality sleep and their goals of heart-healthy living.   Recognizing and Reducing Stress Clinical staff led group instruction and group discussion with PowerPoint presentation and patient guidebook. To enhance the learning environment the use of posters, models and videos may be added. At the conclusion of this workshop, patients will be able to understand the types of stress reactions, differentiate between acute and chronic stress, and recognize the impact that chronic stress has on their health. They will also be able to apply different coping mechanisms, such as reframing negative self-talk. Patients will have the opportunity to practice a variety of stress management techniques, such as deep abdominal breathing, progressive muscle relaxation, and/or guided imagery.  The purpose of this lesson is to educate patients on the role of stress in their lives and to provide healthy techniques for coping with it.  Learning Barriers/Preferences:  Learning Barriers/Preferences - 03/02/24 1546       Learning Barriers/Preferences   Learning Barriers Sight    Learning Preferences Video;Pictoral;Computer/Internet          Education Topics:  Knowledge Questionnaire Score:  Knowledge Questionnaire Score - 03/02/24 1546       Knowledge Questionnaire Score   Pre Score 24/24          Core Components/Risk Factors/Patient Goals at Admission:  Personal Goals and Risk Factors at Admission - 03/02/24 1424       Core Components/Risk Factors/Patient Goals on Admission    Weight Management Yes;Obesity;Weight Loss    Intervention Weight Management: Provide education and appropriate resources to help participant work on and attain dietary goals.;Weight Management: Develop a  combined nutrition and exercise program designed to reach desired caloric intake, while maintaining appropriate intake of nutrient and fiber,  sodium and fats, and appropriate energy expenditure required for the weight goal.;Weight Management/Obesity: Establish reasonable short term and long term weight goals.;Obesity: Provide education and appropriate resources to help participant work on and attain dietary goals.    Expected Outcomes Short Term: Continue to assess and modify interventions until short term weight is achieved;Long Term: Adherence to nutrition and physical activity/exercise program aimed toward attainment of established weight goal;Weight Loss: Understanding of general recommendations for a balanced deficit meal plan, which promotes 1-2 lb weight loss per week and includes a negative energy balance of [phone] kcal/d;Understanding recommendations for meals to include 15-35% energy as protein, 25-35% energy from fat, 35-60% energy from carbohydrates, less than 200mg  of dietary cholesterol, 20-35 gm of total fiber daily;Understanding of distribution of calorie intake throughout the day with the consumption of 4-5 meals/snacks    Heart Failure Yes    Intervention Provide a combined exercise and nutrition program that is supplemented with education, support and counseling about heart failure. Directed toward relieving symptoms such as shortness of breath, decreased exercise tolerance, and extremity edema.    Expected Outcomes Improve functional capacity of life;Short term: Attendance in program 2-3 days a week with increased exercise capacity. Reported lower sodium intake. Reported increased fruit and vegetable intake. Reports medication compliance.;Long term: Adoption of self-care skills and reduction of barriers for early signs and symptoms recognition and intervention leading to self-care maintenance.;Short term: Daily weights obtained and reported for increase. Utilizing diuretic protocols set by  physician.    Hypertension Yes    Intervention Provide education on lifestyle modifcations including regular physical activity/exercise, weight management, moderate sodium restriction and increased consumption of fresh fruit, vegetables, and low fat dairy, alcohol  moderation, and smoking cessation.;Monitor prescription use compliance.    Expected Outcomes Short Term: Continued assessment and intervention until BP is < 140/55mm HG in hypertensive participants. < 130/44mm HG in hypertensive participants with diabetes, heart failure or chronic kidney disease.;Long Term: Maintenance of blood pressure at goal levels.    Lipids Yes    Intervention Provide education and support for participant on nutrition & aerobic/resistive exercise along with prescribed medications to achieve LDL 70mg , HDL >40mg .    Expected Outcomes Short Term: Participant states understanding of desired cholesterol values and is compliant with medications prescribed. Participant is following exercise prescription and nutrition guidelines.;Long Term: Cholesterol controlled with medications as prescribed, with individualized exercise RX and with personalized nutrition plan. Value goals: LDL < 70mg , HDL > 40 mg.    Stress Yes    Intervention Offer individual and/or small group education and counseling on adjustment to heart disease, stress management and health-related lifestyle change. Teach and support self-help strategies.;Refer participants experiencing significant psychosocial distress to appropriate mental health specialists for further evaluation and treatment. When possible, include family members and significant others in education/counseling sessions.    Expected Outcomes Short Term: Participant demonstrates changes in health-related behavior, relaxation and other stress management skills, ability to obtain effective social support, and compliance with psychotropic medications if prescribed.;Long Term: Emotional wellbeing is indicated  by absence of clinically significant psychosocial distress or social isolation.          Core Components/Risk Factors/Patient Goals Review:   Goals and Risk Factor Review     Row Name 03/17/24 1731             Core Components/Risk Factors/Patient Goals Review   Personal Goals Review Weight Management/Obesity;Stress;Heart Failure;Hypertension;Lipids       Review Charlie started cardiac rehab on 03/08/24. Charlie did well on  his first day of exercise and has not yet returned.       Expected Outcomes Bebe will continue to participate in cardiac rehab for exercise, nutrition and lifestyle modifications          Core Components/Risk Factors/Patient Goals at Discharge (Final Review):   Goals and Risk Factor Review - 03/17/24 1731       Core Components/Risk Factors/Patient Goals Review   Personal Goals Review Weight Management/Obesity;Stress;Heart Failure;Hypertension;Lipids    Review Charlie started cardiac rehab on 03/08/24. Charlie did well on his first day of exercise and has not yet returned.    Expected Outcomes Bebe will continue to participate in cardiac rehab for exercise, nutrition and lifestyle modifications          ITP Comments:  ITP Comments     Row Name 03/02/24 1427 03/17/24 1726 03/30/24 1421       ITP Comments Wilbert Holland, MD:  Medical Director.  Introduction to the [facility]ensive Cardiac [facility] / [facility] Education Program.  Initial orientation packet reviewed with the patient. 30 Day ITP Review. Charlie started cardiac rehab on 03/08/24 and did well on his first day of exercise. 30 Day ITP Review. Charlie started cardiac rehab on 03/08/24 and has not returned since his cataract surgery        Comments: See ITP comments.Hadassah Elpidio Quan RN BSN

## 2024-03-31 ENCOUNTER — Encounter (HOSPITAL_COMMUNITY): Admission: RE | Admit: 2024-03-31 | Source: Ambulatory Visit

## 2024-03-31 ENCOUNTER — Telehealth (HOSPITAL_COMMUNITY): Payer: Self-pay

## 2024-03-31 NOTE — Telephone Encounter (Signed)
 Patient called stating he has cataract surgery f/u tomorrow with doctor, hopes to be cleared to return to class on Friday.

## 2024-04-02 ENCOUNTER — Telehealth (HOSPITAL_COMMUNITY): Payer: Self-pay

## 2024-04-02 ENCOUNTER — Ambulatory Visit: Admitting: Internal Medicine

## 2024-04-02 ENCOUNTER — Encounter (HOSPITAL_COMMUNITY): Admission: RE | Admit: 2024-04-02 | Source: Ambulatory Visit

## 2024-04-02 NOTE — Telephone Encounter (Signed)
 Patient c/o for 12:30 CR class, states his doctor told him to heal the next few days from cataract surgery, should be ready for class on Monday.

## 2024-04-02 NOTE — Progress Notes (Signed)
 Remote ICD transmission.

## 2024-04-02 NOTE — Addendum Note (Signed)
 Addended by: VICCI SELLER A on: 04/02/2024 02:57 PM   Modules accepted: Orders

## 2024-04-05 ENCOUNTER — Other Ambulatory Visit: Payer: Self-pay | Admitting: Family Medicine

## 2024-04-05 ENCOUNTER — Encounter (HOSPITAL_COMMUNITY)
Admission: RE | Admit: 2024-04-05 | Discharge: 2024-04-05 | Disposition: A | Discharge status: Home / Self Care | Source: Ambulatory Visit | Attending: Cardiovascular Disease | Admitting: Cardiovascular Disease

## 2024-04-05 ENCOUNTER — Telehealth (HOSPITAL_COMMUNITY): Payer: Self-pay

## 2024-04-05 DIAGNOSIS — I5022 Chronic systolic (congestive) heart failure: Secondary | ICD-10-CM | POA: Insufficient documentation

## 2024-04-05 NOTE — Telephone Encounter (Signed)
 Patient c/o for 12:30 CR class, left message stating he wanted to wait another day until returning from healing from cataract surgery. Says he will be in Wednesday.

## 2024-04-07 ENCOUNTER — Encounter (HOSPITAL_COMMUNITY)

## 2024-04-07 ENCOUNTER — Telehealth (HOSPITAL_COMMUNITY): Payer: Self-pay

## 2024-04-07 NOTE — Telephone Encounter (Signed)
 Patient c/o for 12:30 CR class, states he is still having issues with his eyes from cataract surgery even though he has doctor clearance. States he should be in to class Friday.

## 2024-04-09 ENCOUNTER — Encounter (HOSPITAL_COMMUNITY): Admission: RE | Admit: 2024-04-09 | Source: Ambulatory Visit

## 2024-04-09 ENCOUNTER — Ambulatory Visit: Admitting: Family

## 2024-04-12 ENCOUNTER — Encounter (HOSPITAL_COMMUNITY): Admission: RE | Admit: 2024-04-12 | Source: Ambulatory Visit

## 2024-04-12 ENCOUNTER — Telehealth (HOSPITAL_COMMUNITY): Payer: Self-pay

## 2024-04-12 NOTE — Telephone Encounter (Signed)
 Patient left message at 12:39pm calling out for 12:30pm CR class, no reason given.

## 2024-04-13 ENCOUNTER — Ambulatory Visit: Admitting: Family

## 2024-04-14 ENCOUNTER — Telehealth (HOSPITAL_COMMUNITY): Payer: Self-pay

## 2024-04-14 ENCOUNTER — Encounter (HOSPITAL_COMMUNITY)
Admission: RE | Admit: 2024-04-14 | Discharge: 2024-04-14 | Disposition: A | Discharge status: Home / Self Care | Source: Ambulatory Visit | Attending: Cardiovascular Disease | Admitting: Cardiovascular Disease

## 2024-04-14 NOTE — Telephone Encounter (Signed)
 Patient left message calling out for 12:30 CR class, states he is still concerned about his eye after cataract surgery.

## 2024-04-16 ENCOUNTER — Encounter (HOSPITAL_COMMUNITY): Admission: RE | Admit: 2024-04-16 | Source: Ambulatory Visit

## 2024-04-16 ENCOUNTER — Telehealth (HOSPITAL_COMMUNITY): Payer: Self-pay

## 2024-04-16 NOTE — Telephone Encounter (Signed)
 Patient left message calling out for 12:30 CR class, no reason given.

## 2024-04-19 ENCOUNTER — Telehealth (HOSPITAL_COMMUNITY): Payer: Self-pay | Admitting: *Deleted

## 2024-04-19 ENCOUNTER — Encounter (HOSPITAL_COMMUNITY)

## 2024-04-19 NOTE — Telephone Encounter (Signed)
 John [name] called out for today due to back pain. Has an appt on Friday with [facility]. He would like to be placed on medical hold for his back.  Looking back at his attendance, he has completed 2 exercise sessions (1 being orientation).  Last session completed 6/23. Advised that he could be placed on Medial hold for the program however we will not be able to hold his spot for the class time. He has chosen. When he is able to return to exercise he will be given the available spots in the program.    Explain that we have wait times for new patients for certain class times that we can not schedule because the class is full.  After 30 days of medical hold will have to evaluate whether discharge from the program and return when able to consistently attend. John [name] verbalized understanding.  John [name] will call on Friday after his appt with ortho and advise us  of the plan of care.  Will decide based upon the appt outcome Medical hold for spot in program or discharge and return at a later date. Reminded John [name] that we do have a 18 week hard stop date - 10/15 per insurance guidelines.   Will follow up on Friday.  Saturnino Bernett PEAK, BSN Cardiac and Emergency planning/management officer

## 2024-04-21 ENCOUNTER — Encounter (HOSPITAL_COMMUNITY)

## 2024-04-23 ENCOUNTER — Encounter (HOSPITAL_COMMUNITY): Payer: Self-pay

## 2024-04-23 ENCOUNTER — Encounter (HOSPITAL_COMMUNITY)

## 2024-04-23 DIAGNOSIS — I5022 Chronic systolic (congestive) heart failure: Secondary | ICD-10-CM

## 2024-04-23 NOTE — Progress Notes (Signed)
 Cardiac Individual Treatment Plan  Patient Details  Name: John [name] MRN: [id number] Date of Birth: [birth year] Referring Provider:   Flowsheet Row INTENSIVE CARDIAC REHAB ORIENT from 03/02/2024 in [facility]  Referring Provider John [name]    Initial Encounter Date:  Flowsheet Row INTENSIVE CARDIAC REHAB ORIENT from 03/02/2024 in [facility]  Date 03/02/24    Visit Diagnosis: Heart failure, chronic systolic (HCC)  Patient's Home Medications on Admission:  Current Outpatient Medications:    acetaminophen  (TYLENOL ) 500 MG tablet, Take 1,000 mg by mouth daily., Disp: , Rfl:    apixaban  (ELIQUIS ) 5 MG TABS tablet, Take 1 tablet (5 mg total) by mouth 2 (two) times daily., Disp: 28 tablet, Rfl: 0   bismuth subsalicylate (PEPTO BISMOL) 262 MG/15ML suspension, Take 30 mLs by mouth every 6 (six) hours as needed for diarrhea or loose stools., Disp: , Rfl:    carvedilol  (COREG ) 6.25 MG tablet, Take 1 tablet (6.25 mg total) by mouth 2 (two) times daily with a meal., Disp: 180 tablet, Rfl: 3   dapagliflozin  propanediol (FARXIGA ) 10 MG TABS tablet, TAKE 1 TABLET (10 MG TOTAL) BY MOUTH DAILY. NEEDS FOLLOW UP APPOINTMENT FOR MORE REFILLS, Disp: 90 tablet, Rfl: 3   famotidine -calcium  carbonate-magnesium hydroxide (PEPCID  COMPLETE) 10-800-165 MG chewable tablet, Chew 1 tablet by mouth 2 (two) times daily as needed., Disp: 100 tablet, Rfl: 11   gatifloxacin (ZYMAXID) 0.5 % SOLN, Place 1 drop into the left eye 4 (four) times daily., Disp: , Rfl:    ipratropium-albuterol  (DUONEB) 0.5-2.5 (3) MG/3ML SOLN, Take 3 mLs by nebulization every 4 (four) hours as needed., Disp: 30 mL, Rfl: 1   ketorolac  (ACULAR ) 0.5 % ophthalmic solution, Place 1 drop into the left eye 4 (four) times daily., Disp: , Rfl:    meloxicam  (MOBIC ) 15 MG tablet, TAKE 1 TABLET (15 MG TOTAL) BY MOUTH DAILY. (Patient not  taking: Reported on 03/02/2024), Disp: 30 tablet, Rfl: 0   Multiple Vitamin (MULTIVITAMIN WITH MINERALS) TABS tablet, Take 1 tablet by mouth in the morning., Disp: , Rfl:    prednisoLONE acetate (PRED FORTE) 1 % ophthalmic suspension, Place 1 drop into the left eye 4 (four) times daily., Disp: , Rfl:    rosuvastatin  (CRESTOR ) 20 MG tablet, TAKE 1 TABLET (20 MG TOTAL) BY MOUTH DAILY. NEEDS FOLLOW UP APPOINTMENT FOR MORE REFILLS, Disp: 90 tablet, Rfl: 0   sacubitril -valsartan  (ENTRESTO ) 97-103 MG, Take 1/2 tablet twice a day, Disp: , Rfl:    Semaglutide -Weight Management 0.5 MG/0.5ML SOAJ, Inject 0.5 mg into the skin once a week. (Patient not taking: Reported on 03/02/2024), Disp: , Rfl:    sertraline  (ZOLOFT ) 100 MG tablet, TAKE 1 TABLET BY MOUTH EVERY DAY, Disp: 90 tablet, Rfl: 3   sildenafil  (VIAGRA ) 100 MG tablet, TAKE 1 TABLET (100 MG TOTAL) BY MOUTH AT BEDTIME AS NEEDED FOR ERECTILE DYSFUNCTION., Disp: 90 tablet, Rfl: 3   spironolactone  (ALDACTONE ) 25 MG tablet, Take 1 tablet (25 mg total) by mouth daily. (Patient not taking: Reported on 03/02/2024), Disp: , Rfl:    torsemide  (DEMADEX ) 20 MG tablet, Take 2 tablets (40 mg total) by mouth daily., Disp: 180 tablet, Rfl: 0   traZODone  (DESYREL ) 50 MG tablet, TAKE 1 TABLET BY MOUTH EVERYDAY AT BEDTIME, Disp: 90 tablet, Rfl: 3  Past Medical History: Past Medical History:  Diagnosis Date   AICD (automatic cardioverter/defibrillator) present    [employer]  Anxiety    Ativan    Chronic combined systolic and diastolic CHF, NYHA class 3 (HCC) 10/2016   Nonischemic cardiomyopathy. EF 20-25%.   Chronic left hip pain    Depression    history of   Dysrhythmia    A-FIB   GI bleed 03/2016   Headache    Hypertensive heart disease with combined systolic and diastolic congestive heart failure (HCC) 10/2016   Nonischemic cardiomyopathy (HCC) 10/2016   Echo with EF 20-25%. Cardiac catheterization with no CAD. LVEDP was 41 mmHg, PCWP 36 mmHg   Osteoarthritis     right shoulder   Right hand fracture    Seasonal allergies    Sleep apnea    wears a CPAP   Wears glasses     Tobacco Use: Social History   Tobacco Use  Smoking Status Never  Smokeless Tobacco Never    Labs: Review Flowsheet  More data exists      Latest Ref Rng & Units 08/22/2022 02/27/2023 03/06/2023 08/22/2023 02/03/2024  Labs for ITP Cardiac and Pulmonary Rehab  Cholestrol 0 - 200 mg/dL 855  - 829  859  849   LDL (calc) 0 - 99 mg/dL 48  - - 67  65   Direct LDL mg/dL - - 14.9  - -  HDL-C >60.99 mg/dL 44.59  - 51.29  57.09  46.70   Trlycerides 0.0 - 149.0 mg/dL 800.9  - 684.9  851.9  188.0   Hemoglobin A1c 4.6 - 6.5 % 6.0  5.6  - - 5.8     Capillary Blood Glucose: Lab Results  Component Value Date   GLUCAP 115 (H) 09/09/2023   GLUCAP 103 (H) 09/08/2023   GLUCAP 171 (H) 05/04/2021   GLUCAP 138 (H) 05/04/2021   GLUCAP 144 (H) 05/04/2021     Exercise Target Goals: Exercise Program Goal: Individual exercise prescription set using results from initial 6 min walk test and THRR while considering  patient's activity barriers and safety.   Exercise Prescription Goal: Initial exercise prescription builds to 30-45 minutes a day of aerobic activity, 2-3 days per week.  Home exercise guidelines will be given to patient during program as part of exercise prescription that the participant will acknowledge.  Activity Barriers & Risk Stratification:  Activity Barriers & Cardiac Risk Stratification - 03/02/24 1421       Activity Barriers & Cardiac Risk Stratification   Activity Barriers Left Hip Replacement;Right Hip Replacement;Joint Problems;Arthritis;History of Falls;Balance Concerns;Decreased Ventricular Function;Deconditioning;Back Problems;Other (comment)    Comments Left/Right shoulder replacements    Cardiac Risk Stratification High          6 Minute Walk:  6 Minute Walk     Row Name 03/02/24 1505         6 Minute Walk   Phase Initial  Pt used GO-Cart      Distance 1192 feet     Walk Time 6 minutes     # of Rest Breaks 2  3:07-3:34; 5:16-5:52     MPH 2.26     METS 2.92     RPE 13     Perceived Dyspnea  0     VO2 Peak 10.2     Symptoms Yes (comment)     Comments Low back pain and tightness, 9/10     Resting HR 73 bpm     Resting BP 100/60     Resting Oxygen  Saturation  96 %     Exercise Oxygen  Saturation  during 6 min walk 97 %  Max Ex. HR 120 bpm     Max Ex. BP 134/78     2 Minute Post BP 120/72        Oxygen  Initial Assessment:   Oxygen  Re-Evaluation:   Oxygen  Discharge (Final Oxygen  Re-Evaluation):   Initial Exercise Prescription:  Initial Exercise Prescription - 03/02/24 1400       Date of Initial Exercise RX and Referring Provider   Date 03/02/24    Referring Provider John [name]      Recumbant Bike   Level 1    RPM 60    Watts 34    Minutes 15    METs 2.9      NuStep   Level 2    SPM 75    Minutes 15    METs 2.9      Prescription Details   Frequency (times per week) 3    Duration Progress to 30 minutes of continuous aerobic without signs/symptoms of physical distress      Intensity   THRR 40-80% of Max Heartrate 63-126    Ratings of Perceived Exertion 11-13    Perceived Dyspnea 0-4      Progression   Progression Continue progressive overload as per policy without signs/symptoms or physical distress.      Resistance Training   Training Prescription Yes    Weight 3lbs    Reps 10-15          Perform Capillary Blood Glucose checks as needed.  Exercise Prescription Changes:   Exercise Prescription Changes     Row Name 03/08/24 1600             Response to Exercise   Blood Pressure (Admit) 113/69       Blood Pressure (Exercise) 130/74       Blood Pressure (Exit) 96/60       Heart Rate (Admit) 77 bpm       Heart Rate (Exercise) 94 bpm       Heart Rate (Exit) 67 bpm       Rating of Perceived Exertion (Exercise) 13       Symptoms None       Comments Pt's first day in the CRP2  program       Duration Continue with 30 min of aerobic exercise without signs/symptoms of physical distress.       Intensity THRR unchanged         Progression   Progression Continue to progress workloads to maintain intensity without signs/symptoms of physical distress.       Average METs 2.2         Resistance Training   Training Prescription Yes       Weight 3lbs       Reps 10-15       Time 5 Minutes         Interval Training   Interval Training No         Recumbant Bike   Level 1       RPM 68       Watts 19       Minutes 15       METs 2.1         NuStep   Level 2       SPM 93       Minutes 15       METs 2.3          Exercise Comments:   Exercise Comments     Row Name 03/08/24 1611  03/22/24 0930 04/05/24 1500       Exercise Comments Pt's first day in the CRP2 program. Pt exercised without complaints. Pt due for 2 week MET review but has only attend 1 session due to being out for eye surgery. Will review when patinet has consistant attendence. Pt due for review of METs and goals. Pt has not attended due to being out for cataract surgey. Only attended 1 session to date.        Exercise Goals and Review:   Exercise Goals     Row Name 03/02/24 1422             Exercise Goals   Increase Physical Activity Yes       Intervention Provide advice, education, support and counseling about physical activity/exercise needs.;Develop an individualized exercise prescription for aerobic and resistive training based on initial evaluation findings, risk stratification, comorbidities and participant's personal goals.       Expected Outcomes Short Term: Attend rehab on a regular basis to increase amount of physical activity.;Long Term: Exercising regularly at least 3-5 days a week.;Long Term: Add in home exercise to make exercise part of routine and to increase amount of physical activity.       Increase Strength and Stamina Yes       Intervention Provide advice, education,  support and counseling about physical activity/exercise needs.;Develop an individualized exercise prescription for aerobic and resistive training based on initial evaluation findings, risk stratification, comorbidities and participant's personal goals.       Expected Outcomes Short Term: Increase workloads from initial exercise prescription for resistance, speed, and METs.;Short Term: Perform resistance training exercises routinely during rehab and add in resistance training at home;Long Term: Improve cardiorespiratory fitness, muscular endurance and strength as measured by increased METs and functional capacity ( )       Able to understand and use rate of perceived exertion (RPE) scale Yes       Intervention Provide education and explanation on how to use RPE scale       Expected Outcomes Short Term: Able to use RPE daily in rehab to express subjective intensity level;Long Term:  Able to use RPE to guide intensity level when exercising independently       Knowledge and understanding of Target Heart Rate Range (THRR) Yes       Intervention Provide education and explanation of THRR including how the numbers were predicted and where they are located for reference       Expected Outcomes Short Term: Able to state/look up THRR;Long Term: Able to use THRR to govern intensity when exercising independently;Short Term: Able to use daily as guideline for intensity in rehab       Understanding of Exercise Prescription Yes       Intervention Provide education, explanation, and written materials on patient's individual exercise prescription       Expected Outcomes Short Term: Able to explain program exercise prescription;Long Term: Able to explain home exercise prescription to exercise independently          Exercise Goals Re-Evaluation :  Exercise Goals Re-Evaluation     Row Name 03/08/24 1606             Exercise Goal Re-Evaluation   Exercise Goals Review Increase Physical Activity;Increase Strength  and Stamina;Able to understand and use rate of perceived exertion (RPE) scale;Knowledge and understanding of Target Heart Rate Range (THRR);Understanding of Exercise Prescription       Comments Pt's first day in the CRP2 program. Pt  understands the Exercise Rx, RPE scale and THRR.       Expected Outcomes will continue to monitor patient and progress exericse workloads as tolerated.          Discharge Exercise Prescription (Final Exercise Prescription Changes):  Exercise Prescription Changes - 03/08/24 1600       Response to Exercise   Blood Pressure (Admit) 113/69    Blood Pressure (Exercise) 130/74    Blood Pressure (Exit) 96/60    Heart Rate (Admit) 77 bpm    Heart Rate (Exercise) 94 bpm    Heart Rate (Exit) 67 bpm    Rating of Perceived Exertion (Exercise) 13    Symptoms None    Comments Pt's first day in the CRP2 program    Duration Continue with 30 min of aerobic exercise without signs/symptoms of physical distress.    Intensity THRR unchanged      Progression   Progression Continue to progress workloads to maintain intensity without signs/symptoms of physical distress.    Average METs 2.2      Resistance Training   Training Prescription Yes    Weight 3lbs    Reps 10-15    Time 5 Minutes      Interval Training   Interval Training No      Recumbant Bike   Level 1    RPM 68    Watts 19    Minutes 15    METs 2.1      NuStep   Level 2    SPM 93    Minutes 15    METs 2.3          Nutrition:  Target Goals: Understanding of nutrition guidelines, daily intake of sodium 1500mg , cholesterol 200mg , calories 30% from fat and 7% or less from saturated fats, daily to have 5 or more servings of fruits and vegetables.  Biometrics:  Pre Biometrics - 03/02/24 1405       Pre Biometrics   Waist Circumference 51.5 inches    Hip Circumference 49.5 inches    Waist to Hip Ratio 1.04 %    Triceps Skinfold 36 mm    % Body Fat 38.9 %    Grip Strength 22 kg     Flexibility --   Not performed, low back pain   Single Leg Stand 3 seconds           Nutrition Therapy Plan and Nutrition Goals:  Nutrition Therapy & Goals - 04/05/24 1312       Nutrition Therapy   Diet Heart Healthy Diet    Drug/Food Interactions Statins/Certain Fruits      Personal Nutrition Goals   Nutrition Goal Patient to identify strategies for reducing cardiovascular risk by attending the [employer] education and nutrition series weekly.    Personal Goal #2 Patient to improve diet quality by using the plate method as a guide for meal planning to include lean protein/plant protein, fruits, vegetables, whole grains, nonfat dairy as part of a well-balanced diet.    Personal Goal #3 Patient to identify strategies for weight loss of 0.5-2.0# per week.    Comments Goals not met. John [name] has not attended cardiac rehab since 6/23 due to cataract surgery. John [name] has medical history of severe nonischemic cardiomyopathy, paroxysmal atrial fibrillation, polymorphic ventricular tachycardia, aortic atherosclerosis, moderate nonobstructive CAD with severe plaque burden, systolic heart failure, obstructive sleep apnea on CPAP and severe obesity. He has previously completed cardiac rehab in the past. LDL is well controlled. A1c is in a pre-diabetic  range and triglycerides remain elevated. Patient will benefit from participation in intensive cardiac rehab for nutrition, exercise, and lifestyle modification.      Intervention Plan   Intervention Prescribe, educate and counsel regarding individualized specific dietary modifications aiming towards targeted core components such as weight, hypertension, lipid management, diabetes, heart failure and other comorbidities.;Nutrition handout(s) given to patient.    Expected Outcomes Short Term Goal: Understand basic principles of dietary content, such as calories, fat, sodium, cholesterol and nutrients.;Long Term Goal: Adherence to prescribed nutrition plan.           Nutrition Assessments:  MEDIFICTS Score Key: >=70 Need to make dietary changes  40-70 Heart Healthy Diet <= 40 Therapeutic Level Cholesterol Diet    Picture Your Plate Scores: <59 Unhealthy dietary pattern with much room for improvement. 41-50 Dietary pattern unlikely to meet recommendations for good health and room for improvement. 51-60 More healthful dietary pattern, with some room for improvement.  >60 Healthy dietary pattern, although there may be some specific behaviors that could be improved.    Nutrition Goals Re-Evaluation:  Nutrition Goals Re-Evaluation     Row Name 03/08/24 1606 04/05/24 1312           Goals   Current Weight 252 lb 13.9 oz (114.7 kg) --      Comment A1c 5.8, LDL 65, HDL 46, Triglycerides 188 J8r 5.8, LDL 65, HDL 46, Triglycerides 188      Expected Outcome John [name] has medical history of severe nonischemic cardiomyopathy, paroxysmal atrial fibrillation, polymorphic ventricular tachycardia, aortic atherosclerosis, moderate nonobstructive CAD with severe plaque burden, systolic heart failure, obstructive sleep apnea on CPAP and severe obesity. He has previously completed cardiac rehab in the past. LDL is well controlled. A1c is in a pre-diabetic range and triglycerides remain elevated. Patient will benefit from participation in intensive cardiac rehab for nutrition, exercise, and lifestyle modification. Goals not met. John [name] has not attended cardiac rehab since 6/23 due to cataract surgery. John [name] has medical history of severe nonischemic cardiomyopathy, paroxysmal atrial fibrillation, polymorphic ventricular tachycardia, aortic atherosclerosis, moderate nonobstructive CAD with severe plaque burden, systolic heart failure, obstructive sleep apnea on CPAP and severe obesity. He has previously completed cardiac rehab in the past. LDL is well controlled. A1c is in a pre-diabetic range and triglycerides remain elevated. Patient will benefit from participation  in intensive cardiac rehab for nutrition, exercise, and lifestyle modification.         Nutrition Goals Re-Evaluation:  Nutrition Goals Re-Evaluation     Row Name 03/08/24 1606 04/05/24 1312           Goals   Current Weight 252 lb 13.9 oz (114.7 kg) --      Comment A1c 5.8, LDL 65, HDL 46, Triglycerides 188 J8r 5.8, LDL 65, HDL 46, Triglycerides 188      Expected Outcome John [name] has medical history of severe nonischemic cardiomyopathy, paroxysmal atrial fibrillation, polymorphic ventricular tachycardia, aortic atherosclerosis, moderate nonobstructive CAD with severe plaque burden, systolic heart failure, obstructive sleep apnea on CPAP and severe obesity. He has previously completed cardiac rehab in the past. LDL is well controlled. A1c is in a pre-diabetic range and triglycerides remain elevated. Patient will benefit from participation in intensive cardiac rehab for nutrition, exercise, and lifestyle modification. Goals not met. John [name] has not attended cardiac rehab since 6/23 due to cataract surgery. John [name] has medical history of severe nonischemic cardiomyopathy, paroxysmal atrial fibrillation, polymorphic ventricular tachycardia, aortic atherosclerosis, moderate nonobstructive CAD with severe plaque burden, systolic heart failure, obstructive sleep  apnea on CPAP and severe obesity. He has previously completed cardiac rehab in the past. LDL is well controlled. A1c is in a pre-diabetic range and triglycerides remain elevated. Patient will benefit from participation in intensive cardiac rehab for nutrition, exercise, and lifestyle modification.         Nutrition Goals Discharge (Final Nutrition Goals Re-Evaluation):  Nutrition Goals Re-Evaluation - 04/05/24 1312       Goals   Comment A1c 5.8, LDL 65, HDL 46, Triglycerides 188    Expected Outcome Goals not met. John [name] has not attended cardiac rehab since 6/23 due to cataract surgery. John [name] has medical history of severe nonischemic  cardiomyopathy, paroxysmal atrial fibrillation, polymorphic ventricular tachycardia, aortic atherosclerosis, moderate nonobstructive CAD with severe plaque burden, systolic heart failure, obstructive sleep apnea on CPAP and severe obesity. He has previously completed cardiac rehab in the past. LDL is well controlled. A1c is in a pre-diabetic range and triglycerides remain elevated. Patient will benefit from participation in intensive cardiac rehab for nutrition, exercise, and lifestyle modification.          Psychosocial: Target Goals: Acknowledge presence or absence of significant depression and/or stress, maximize coping skills, provide positive support system. Participant is able to verbalize types and ability to use techniques and skills needed for reducing stress and depression.  Initial Review & Psychosocial Screening:  Initial Psych Review & Screening - 03/02/24 1416       Initial Review   Current issues with Current Depression;Current Sleep Concerns;Current Stress Concerns    Source of Stress Concerns Family    Comments Stress mainly regarding chlidren      Family Dynamics   Good Support System? Yes   Has sposue for support     Barriers   Psychosocial barriers to participate in program The patient should benefit from training in stress management and relaxation.      Screening Interventions   Interventions Encouraged to exercise    Expected Outcomes Short Term goal: Identification and review with participant of any Quality of Life or Depression concerns found by scoring the questionnaire.;Long Term goal: The participant improves quality of Life and PHQ9 Scores as seen by post scores and/or verbalization of changes;Short Term goal: Utilizing psychosocial counselor, staff and physician to assist with identification of specific Stressors or current issues interfering with healing process. Setting desired goal for each stressor or current issue identified.;Long Term Goal: Stressors or  current issues are controlled or eliminated.          Quality of Life Scores:  Quality of Life - 03/02/24 1545       Quality of Life   Select Quality of Life      Quality of Life Scores   Health/Function Pre 19.67 %    Socioeconomic Pre 26.64 %    Psych/Spiritual Pre 21.57 %    Family Pre 28.8 %    GLOBAL Pre 22.84 %         Scores of 19 and below usually indicate a poorer quality of life in these areas.  A difference of  2-3 points is a clinically meaningful difference.  A difference of 2-3 points in the total score of the Quality of Life Index has been associated with significant improvement in overall quality of life, self-image, physical symptoms, and general health in studies assessing change in quality of life.  PHQ-9: Review Flowsheet  More data exists      03/02/2024 11/13/2023 05/09/2023 02/18/2023 08/22/2022  Depression screen PHQ 2/9  Decreased Interest 2  0 0 0 0  Down, Depressed, Hopeless 1 0 0 0 0  PHQ - 2 Score 3 0 0 0 0  Altered sleeping 2 - - - 0  Tired, decreased energy 2 - - - 0  Change in appetite 0 - - - 0  Feeling bad or failure about yourself  1 - - - 0  Trouble concentrating 0 - - - 0  Moving slowly or fidgety/restless 0 - - - 0  Suicidal thoughts 0 - - - 0  PHQ-9 Score 8 - - - 0  Difficult doing work/chores Not difficult at all - - - -   Interpretation of Total Score  Total Score Depression Severity:  1-4 = Minimal depression, 5-9 = Mild depression, 10-14 = Moderate depression, 15-19 = Moderately severe depression, 20-27 = Severe depression   Psychosocial Evaluation and Intervention:   Psychosocial Re-Evaluation:  Psychosocial Re-Evaluation     Row Name 03/17/24 1728 03/30/24 1422 04/23/24 1317         Psychosocial Re-Evaluation   Current issues with Current Depression;Current Sleep Concerns;Current Stress Concerns Current Depression;Current Sleep Concerns;Current Stress Concerns Current Depression;Current Sleep Concerns;Current Stress  Concerns     Comments John [name] did not voice any increased concerns or stressors on his first day of exercise. Will review quality of life questionnaire and PHQ9 when the patient returns to exercise Will review quality of life questionnaire and PHQ9 when the patient returns to exercise. Patient's last day of  exercise was on 03/08/24 Will review quality of life questionnaire and PHQ9 when the patient returns to exercise. Patient's last day of  exercise was on 03/08/24     Expected Outcomes John [name] will have decreased or controlled depression upon completion of cardiac rehab John [name] will have decreased or controlled depression upon completion of cardiac rehab John [name] will have decreased or controlled depression upon completion of cardiac rehab     Interventions Stress management education;Encouraged to attend Cardiac Rehabilitation for the exercise;Relaxation education Stress management education;Encouraged to attend Cardiac Rehabilitation for the exercise;Relaxation education Stress management education;Encouraged to attend Cardiac Rehabilitation for the exercise;Relaxation education     Continue Psychosocial Services  Follow up required by staff Follow up required by staff Follow up required by staff       Initial Review   Source of Stress Concerns Chronic Illness;Family Chronic Illness;Family Chronic Illness;Family     Comments Will cintinue to monitor and offer support as needed. Will cintinue to monitor and offer support as needed. Will continue to monitor and offer support as needed.        Psychosocial Discharge (Final Psychosocial Re-Evaluation):  Psychosocial Re-Evaluation - 04/23/24 1317       Psychosocial Re-Evaluation   Current issues with Current Depression;Current Sleep Concerns;Current Stress Concerns    Comments Will review quality of life questionnaire and PHQ9 when the patient returns to exercise. Patient's last day of  exercise was on 03/08/24    Expected Outcomes John [name] will  have decreased or controlled depression upon completion of cardiac rehab    Interventions Stress management education;Encouraged to attend Cardiac Rehabilitation for the exercise;Relaxation education    Continue Psychosocial Services  Follow up required by staff      Initial Review   Source of Stress Concerns Chronic Illness;Family    Comments Will continue to monitor and offer support as needed.          Vocational Rehabilitation: Provide vocational rehab assistance to qualifying candidates.   Vocational Rehab Evaluation & Intervention:  Vocational Rehab -  03/02/24 1424       Initial Vocational Rehab Evaluation & Intervention   Assessment shows need for Vocational Rehabilitation No   Pt is retired         Education: Education Goals: Education classes will be provided on a weekly basis, covering required topics. Participant will state understanding/return demonstration of topics presented.    Education     Row Name 03/08/24 1400     Education   Cardiac Education Topics --   Select --     Core Videos   Educator --   Select --   Exercise Education --   Instruction Review Code --      Core Videos: Exercise    Move It!  Clinical staff conducted group or individual video education with verbal and written material and guidebook.  Patient learns the recommended Pritikin exercise program. Exercise with the goal of living a long, healthy life. Some of the health benefits of exercise include controlled diabetes, healthier blood pressure levels, improved cholesterol levels, improved heart and lung capacity, improved sleep, and better body composition. Everyone should speak with their doctor before starting or changing an exercise routine.  Biomechanical Limitations Clinical staff conducted group or individual video education with verbal and written material and guidebook.  Patient learns how biomechanical limitations can impact exercise and how we can mitigate and possibly  overcome limitations to have an impactful and balanced exercise routine.  Body Composition Clinical staff conducted group or individual video education with verbal and written material and guidebook.  Patient learns that body composition (ratio of muscle mass to fat mass) is a key component to assessing overall fitness, rather than body weight alone. Increased fat mass, especially visceral belly fat, can put us  at increased risk for metabolic syndrome, type 2 diabetes, heart disease, and even death. It is recommended to combine diet and exercise (cardiovascular and resistance training) to improve your body composition. Seek guidance from your physician and exercise physiologist before implementing an exercise routine.  Exercise Action Plan Clinical staff conducted group or individual video education with verbal and written material and guidebook.  Patient learns the recommended strategies to achieve and enjoy long-term exercise adherence, including variety, self-motivation, self-efficacy, and positive decision making. Benefits of exercise include fitness, good health, weight management, more energy, better sleep, less stress, and overall well-being.  Medical   Heart Disease Risk Reduction Clinical staff conducted group or individual video education with verbal and written material and guidebook.  Patient learns our heart is our most vital organ as it circulates oxygen , nutrients, white blood cells, and hormones throughout the entire body, and carries waste away. Data supports a plant-based eating plan like the Pritikin Program for its effectiveness in slowing progression of and reversing heart disease. The video provides a number of recommendations to address heart disease.   Metabolic Syndrome and Belly Fat  Clinical staff conducted group or individual video education with verbal and written material and guidebook.  Patient learns what metabolic syndrome is, how it leads to heart disease, and how  one can reverse it and keep it from coming back. You have metabolic syndrome if you have 3 of the following 5 criteria: abdominal obesity, high blood pressure, high triglycerides, low HDL cholesterol, and high blood sugar.  Hypertension and Heart Disease Clinical staff conducted group or individual video education with verbal and written material and guidebook.  Patient learns that high blood pressure, or hypertension, is very common in the [place] . Hypertension is largely due to  excessive salt intake, but other important risk factors include being overweight, physical inactivity, drinking too much alcohol , smoking, and not eating enough potassium from fruits and vegetables. High blood pressure is a leading risk factor for heart attack, stroke, congestive heart failure, dementia, kidney failure, and premature death. Long-term effects of excessive salt intake include stiffening of the arteries and thickening of heart muscle and organ damage. Recommendations include ways to reduce hypertension and the risk of heart disease.  Diseases of Our Time - Focusing on Diabetes Clinical staff conducted group or individual video education with verbal and written material and guidebook.  Patient learns why the best way to stop diseases of our time is prevention, through food and other lifestyle changes. Medicine (such as prescription pills and surgeries) is often only a Band-Aid on the problem, not a long-term solution. Most common diseases of our time include obesity, type 2 diabetes, hypertension, heart disease, and cancer. The Pritikin Program is recommended and has been proven to help reduce, reverse, and/or prevent the damaging effects of metabolic syndrome.  Nutrition   Overview of the Pritikin Eating Plan  Clinical staff conducted group or individual video education with verbal and written material and guidebook.  Patient learns about the Pritikin Eating Plan for disease risk reduction. The Pritikin  Eating Plan emphasizes a wide variety of unrefined, minimally-processed carbohydrates, like fruits, vegetables, whole grains, and legumes. Go, Caution, and Stop food choices are explained. Plant-based and lean animal proteins are emphasized. Rationale provided for low sodium intake for blood pressure control, low added sugars for blood sugar stabilization, and low added fats and oils for coronary artery disease risk reduction and weight management.  Calorie Density  Clinical staff conducted group or individual video education with verbal and written material and guidebook.  Patient learns about calorie density and how it impacts the Pritikin Eating Plan. Knowing the characteristics of the food you choose will help you decide whether those foods will lead to weight gain or weight loss, and whether you want to consume more or less of them. Weight loss is usually a side effect of the Pritikin Eating Plan because of its focus on low calorie-dense foods.  Label Reading  Clinical staff conducted group or individual video education with verbal and written material and guidebook.  Patient learns about the Pritikin recommended label reading guidelines and corresponding recommendations regarding calorie density, added sugars, sodium content, and whole grains.  Dining Out - Part 1  Clinical staff conducted group or individual video education with verbal and written material and guidebook.  Patient learns that restaurant meals can be sabotaging because they can be so high in calories, fat, sodium, and/or sugar. Patient learns recommended strategies on how to positively address this and avoid unhealthy pitfalls.  Facts on Fats  Clinical staff conducted group or individual video education with verbal and written material and guidebook.  Patient learns that lifestyle modifications can be just as effective, if not more so, as many medications for lowering your risk of heart disease. A Pritikin lifestyle can help to  reduce your risk of inflammation and atherosclerosis (cholesterol build-up, or plaque, in the artery walls). Lifestyle interventions such as dietary choices and physical activity address the cause of atherosclerosis. A review of the types of fats and their impact on blood cholesterol levels, along with dietary recommendations to reduce fat intake is also included.  Nutrition Action Plan  Clinical staff conducted group or individual video education with verbal and written material and guidebook.  Patient learns  how to incorporate Pritikin recommendations into their lifestyle. Recommendations include planning and keeping personal health goals in mind as an important part of their success.  Healthy Mind-Set    Healthy Minds, Bodies, Hearts  Clinical staff conducted group or individual video education with verbal and written material and guidebook.  Patient learns how to identify when they are stressed. Video will discuss the impact of that stress, as well as the many benefits of stress management. Patient will also be introduced to stress management techniques. The way we think, act, and feel has an impact on our hearts.  How Our Thoughts Can Heal Our Hearts  Clinical staff conducted group or individual video education with verbal and written material and guidebook.  Patient learns that negative thoughts can cause depression and anxiety. This can result in negative lifestyle behavior and serious health problems. Cognitive behavioral therapy is an effective method to help control our thoughts in order to change and improve our emotional outlook.  Additional Videos:  Exercise    Improving Performance  Clinical staff conducted group or individual video education with verbal and written material and guidebook.  Patient learns to use a non-linear approach by alternating intensity levels and lengths of time spent exercising to help burn more calories and lose more body fat. Cardiovascular exercise helps  improve heart health, metabolism, hormonal balance, blood sugar control, and recovery from fatigue. Resistance training improves strength, endurance, balance, coordination, reaction time, metabolism, and muscle mass. Flexibility exercise improves circulation, posture, and balance. Seek guidance from your physician and exercise physiologist before implementing an exercise routine and learn your capabilities and proper form for all exercise.  Introduction to Yoga  Clinical staff conducted group or individual video education with verbal and written material and guidebook.  Patient learns about yoga, a discipline of the coming together of mind, breath, and body. The benefits of yoga include improved flexibility, improved range of motion, better posture and core strength, increased lung function, weight loss, and positive self-image. Yoga's heart health benefits include lowered blood pressure, healthier heart rate, decreased cholesterol and triglyceride levels, improved immune function, and reduced stress. Seek guidance from your physician and exercise physiologist before implementing an exercise routine and learn your capabilities and proper form for all exercise.  Medical   Aging: Enhancing Your Quality of Life  Clinical staff conducted group or individual video education with verbal and written material and guidebook.  Patient learns key strategies and recommendations to stay in good physical health and enhance quality of life, such as prevention strategies, having an advocate, securing a Health Care Proxy and Power of Attorney, and keeping a list of medications and system for tracking them. It also discusses how to avoid risk for bone loss.  Biology of Weight Control  Clinical staff conducted group or individual video education with verbal and written material and guidebook.  Patient learns that weight gain occurs because we consume more calories than we burn (eating more, moving less). Even if your body  weight is normal, you may have higher ratios of fat compared to muscle mass. Too much body fat puts you at increased risk for cardiovascular disease, heart attack, stroke, type 2 diabetes, and obesity-related cancers. In addition to exercise, following the Pritikin Eating Plan can help reduce your risk.  Decoding Lab Results  Clinical staff conducted group or individual video education with verbal and written material and guidebook.  Patient learns that lab test reflects one measurement whose values change over time and are influenced by many  factors, including medication, stress, sleep, exercise, food, hydration, pre-existing medical conditions, and more. It is recommended to use the knowledge from this video to become more involved with your lab results and evaluate your numbers to speak with your doctor.   Diseases of Our Time - Overview  Clinical staff conducted group or individual video education with verbal and written material and guidebook.  Patient learns that according to the CDC, 50% to 70% of chronic diseases (such as obesity, type 2 diabetes, elevated lipids, hypertension, and heart disease) are avoidable through lifestyle improvements including healthier food choices, listening to satiety cues, and increased physical activity.  Sleep Disorders Clinical staff conducted group or individual video education with verbal and written material and guidebook.  Patient learns how good quality and duration of sleep are important to overall health and well-being. Patient also learns about sleep disorders and how they impact health along with recommendations to address them, including discussing with a physician.  Nutrition  Dining Out - Part 2 Clinical staff conducted group or individual video education with verbal and written material and guidebook.  Patient learns how to plan ahead and communicate in order to maximize their dining experience in a healthy and nutritious manner. Included are  recommended food choices based on the type of restaurant the patient is visiting.   Fueling a Banker conducted group or individual video education with verbal and written material and guidebook.  There is a strong connection between our food choices and our health. Diseases like obesity and type 2 diabetes are very prevalent and are in large-part due to lifestyle choices. The Pritikin Eating Plan provides plenty of food and hunger-curbing satisfaction. It is easy to follow, affordable, and helps reduce health risks.  Menu Workshop  Clinical staff conducted group or individual video education with verbal and written material and guidebook.  Patient learns that restaurant meals can sabotage health goals because they are often packed with calories, fat, sodium, and sugar. Recommendations include strategies to plan ahead and to communicate with the manager, chef, or server to help order a healthier meal.  Planning Your Eating Strategy  Clinical staff conducted group or individual video education with verbal and written material and guidebook.  Patient learns about the Pritikin Eating Plan and its benefit of reducing the risk of disease. The Pritikin Eating Plan does not focus on calories. Instead, it emphasizes high-quality, nutrient-rich foods. By knowing the characteristics of the foods, we choose, we can determine their calorie density and make informed decisions.  Targeting Your Nutrition Priorities  Clinical staff conducted group or individual video education with verbal and written material and guidebook.  Patient learns that lifestyle habits have a tremendous impact on disease risk and progression. This video provides eating and physical activity recommendations based on your personal health goals, such as reducing LDL cholesterol, losing weight, preventing or controlling type 2 diabetes, and reducing high blood pressure.  Vitamins and Minerals  Clinical staff conducted  group or individual video education with verbal and written material and guidebook.  Patient learns different ways to obtain key vitamins and minerals, including through a recommended healthy diet. It is important to discuss all supplements you take with your doctor.   Healthy Mind-Set    Smoking Cessation  Clinical staff conducted group or individual video education with verbal and written material and guidebook.  Patient learns that cigarette smoking and tobacco addiction pose a serious health risk which affects millions of people. Stopping smoking will significantly reduce  the risk of heart disease, lung disease, and many forms of cancer. Recommended strategies for quitting are covered, including working with your doctor to develop a successful plan.  Culinary   Becoming a Set designer conducted group or individual video education with verbal and written material and guidebook.  Patient learns that cooking at home can be healthy, cost-effective, quick, and puts them in control. Keys to cooking healthy recipes will include looking at your recipe, assessing your equipment needs, planning ahead, making it simple, choosing cost-effective seasonal ingredients, and limiting the use of added fats, salts, and sugars.  Cooking - Breakfast and Snacks  Clinical staff conducted group or individual video education with verbal and written material and guidebook.  Patient learns how important breakfast is to satiety and nutrition through the entire day. Recommendations include key foods to eat during breakfast to help stabilize blood sugar levels and to prevent overeating at meals later in the day. Planning ahead is also a key component.  Cooking - Educational psychologist conducted group or individual video education with verbal and written material and guidebook.  Patient learns eating strategies to improve overall health, including an approach to cook more at home. Recommendations  include thinking of animal protein as a side on your plate rather than center stage and focusing instead on lower calorie dense options like vegetables, fruits, whole grains, and plant-based proteins, such as beans. Making sauces in large quantities to freeze for later and leaving the skin on your vegetables are also recommended to maximize your experience.  Cooking - Healthy Salads and Dressing Clinical staff conducted group or individual video education with verbal and written material and guidebook.  Patient learns that vegetables, fruits, whole grains, and legumes are the foundations of the Pritikin Eating Plan. Recommendations include how to incorporate each of these in flavorful and healthy salads, and how to create homemade salad dressings. Proper handling of ingredients is also covered. Cooking - Soups and [employer] - Soups and Desserts Clinical staff conducted group or individual video education with verbal and written material and guidebook.  Patient learns that Pritikin soups and desserts make for easy, nutritious, and delicious snacks and meal components that are low in sodium, fat, sugar, and calorie density, while high in vitamins, minerals, and filling fiber. Recommendations include simple and healthy ideas for soups and desserts.   Overview     The Pritikin Solution Program Overview Clinical staff conducted group or individual video education with verbal and written material and guidebook.  Patient learns that the results of the Pritikin Program have been documented in more than 100 articles published in peer-reviewed journals, and the benefits include reducing risk factors for (and, in some cases, even reversing) high cholesterol, high blood pressure, type 2 diabetes, obesity, and more! An overview of the three key pillars of the Pritikin Program will be covered: eating well, doing regular exercise, and having a healthy mind-set.  WORKSHOPS  Exercise: Exercise Basics:  Building Your Action Plan Clinical staff led group instruction and group discussion with PowerPoint presentation and patient guidebook. To enhance the learning environment the use of posters, models and videos may be added. At the conclusion of this workshop, patients will comprehend the difference between physical activity and exercise, as well as the benefits of incorporating both, into their routine. Patients will understand the FITT (Frequency, Intensity, Time, and Type) principle and how to use it to build an exercise action plan. In addition, safety concerns and  other considerations for exercise and cardiac rehab will be addressed by the presenter. The purpose of this lesson is to promote a comprehensive and effective weekly exercise routine in order to improve patients' overall level of fitness.   Managing Heart Disease: Your Path to a Healthier Heart Clinical staff led group instruction and group discussion with PowerPoint presentation and patient guidebook. To enhance the learning environment the use of posters, models and videos may be added.At the conclusion of this workshop, patients will understand the anatomy and physiology of the heart. Additionally, they will understand how Pritikin's three pillars impact the risk factors, the progression, and the management of heart disease.  The purpose of this lesson is to provide a high-level overview of the heart, heart disease, and how the Pritikin lifestyle positively impacts risk factors.  Exercise Biomechanics Clinical staff led group instruction and group discussion with PowerPoint presentation and patient guidebook. To enhance the learning environment the use of posters, models and videos may be added. Patients will learn how the structural parts of their bodies function and how these functions impact their daily activities, movement, and exercise. Patients will learn how to promote a neutral spine, learn how to manage pain, and identify  ways to improve their physical movement in order to promote healthy living. The purpose of this lesson is to expose patients to common physical limitations that impact physical activity. Participants will learn practical ways to adapt and manage aches and pains, and to minimize their effect on regular exercise. Patients will learn how to maintain good posture while sitting, walking, and lifting.  Balance Training and Fall Prevention  Clinical staff led group instruction and group discussion with PowerPoint presentation and patient guidebook. To enhance the learning environment the use of posters, models and videos may be added. At the conclusion of this workshop, patients will understand the importance of their sensorimotor skills (vision, proprioception, and the vestibular system) in maintaining their ability to balance as they age. Patients will apply a variety of balancing exercises that are appropriate for their current level of function. Patients will understand the common causes for poor balance, possible solutions to these problems, and ways to modify their physical environment in order to minimize their fall risk. The purpose of this lesson is to teach patients about the importance of maintaining balance as they age and ways to minimize their risk of falling.  WORKSHOPS   Nutrition:  Fueling a Ship broker led group instruction and group discussion with PowerPoint presentation and patient guidebook. To enhance the learning environment the use of posters, models and videos may be added. Patients will review the foundational principles of the Pritikin Eating Plan and understand what constitutes a serving size in each of the food groups. Patients will also learn Pritikin-friendly foods that are better choices when away from home and review make-ahead meal and snack options. Calorie density will be reviewed and applied to three nutrition priorities: weight maintenance, weight  loss, and weight gain. The purpose of this lesson is to reinforce (in a group setting) the key concepts around what patients are recommended to eat and how to apply these guidelines when away from home by planning and selecting [facility]in-friendly options. Patients will understand how calorie density may be adjusted for different weight management goals.  Mindful Eating  Clinical staff led group instruction and group discussion with PowerPoint presentation and patient guidebook. To enhance the learning environment the use of posters, models and videos may be added. Patients will briefly review  the concepts of the [facility] Eating Plan and the importance of low-calorie dense foods. The concept of mindful eating will be introduced as well as the importance of paying attention to internal hunger signals. Triggers for non-hunger eating and techniques for dealing with triggers will be explored. The purpose of this lesson is to provide patients with the opportunity to review the basic principles of the [facility] Eating Plan, discuss the value of eating mindfully and how to measure internal cues of hunger and fullness using the Hunger Scale. Patients will also discuss reasons for non-hunger eating and learn strategies to use for controlling emotional eating.  Targeting Your Nutrition Priorities Clinical staff led group instruction and group discussion with PowerPoint presentation and patient guidebook. To enhance the learning environment the use of posters, models and videos may be added. Patients will learn how to determine their genetic susceptibility to disease by reviewing their family history. Patients will gain insight into the importance of diet as part of an overall healthy lifestyle in mitigating the impact of genetics and other environmental insults. The purpose of this lesson is to provide patients with the opportunity to assess their personal nutrition priorities by looking at their family history, their own  health history and current risk factors. Patients will also be able to discuss ways of prioritizing and modifying the [facility] Eating Plan for their highest risk areas  Menu  Clinical staff led group instruction and group discussion with PowerPoint presentation and patient guidebook. To enhance the learning environment the use of posters, models and videos may be added. Using menus brought in from [employer], or printed from [employer], patients will apply the [facility] dining out guidelines that were presented in the [employer] video. Patients will also be able to practice these guidelines in a variety of provided scenarios. The purpose of this lesson is to provide patients with the opportunity to practice hands-on learning of the [facility] Dining Out guidelines with actual menus and practice scenarios.  Label Reading Clinical staff led group instruction and group discussion with PowerPoint presentation and patient guidebook. To enhance the learning environment the use of posters, models and videos may be added. Patients will review and discuss the [facility] label reading guidelines presented in [employer]'s Label Reading Educational series video. Using fool labels brought in from local grocery stores and markets, patients will apply the label reading guidelines and determine if the packaged food meet the [facility]ritikin guidelines. The purpose of this lesson is to provide patients with the opportunity to review, discuss, and practice hands-on learning of the [facility] Label Reading guidelines with actual packaged food labels. Cooking School  [facility]'s [employer] are designed to teach patients ways to prepare quick, simple, and affordable recipes at home. The importance of nutrition's role in chronic disease risk reduction is reflected in its emphasis in the overall [facility] program. By learning how to prepare essential core P[facility]in Eating Plan recipes, patients will  increase control over what they eat; be able to customize the flavor of foods without the use of added salt, sugar, or fat; and improve the quality of the food they consume. By learning a set of core recipes which are easily assembled, quickly prepared, and affordable, patients are more likely to prepare more healthy foods at home. These workshops focus on convenient breakfasts, simple entres, side dishes, and desserts which can be prepared with minimal effort and are consistent with nutrition recommendations for cardiovascular risk reduction. Cooking [employer] are taught by a Investment banker, operational  or registered dietitian (RD) who has been trained by the [employer] team. The chef or RD has a clear understanding of the importance of minimizing - if not completely eliminating - added fat, sugar, and sodium in recipes. Throughout the series of Cooking School Workshop sessions, patients will learn about healthy ingredients and efficient methods of cooking to build confidence in their capability to prepare    Cooking School weekly topics:  Adding Flavor- Sodium-Free  Fast and Healthy Breakfasts  Powerhouse Plant-Based Proteins  Satisfying Salads and Dressings  Simple Sides and Sauces  International Cuisine-Spotlight on the [employer] Zones  Delicious Desserts  Savory Soups  [employer] - Meals in a Astronomer Appetizers and Snacks  Comforting Weekend Breakfasts  One-Pot Wonders   Fast Evening Meals  Landscape architect Your Pritikin Plate  WORKSHOPS   Healthy Mindset (Psychosocial):  Focused Goals, Sustainable Changes Clinical staff led group instruction and group discussion with PowerPoint presentation and patient guidebook. To enhance the learning environment the use of posters, models and videos may be added. Patients will be able to apply effective goal setting strategies to establish at least one personal goal, and then take consistent, meaningful action toward that goal.  They will learn to identify common barriers to achieving personal goals and develop strategies to overcome them. Patients will also gain an understanding of how our mind-set can impact our ability to achieve goals and the importance of cultivating a positive and growth-oriented mind-set. The purpose of this lesson is to provide patients with a deeper understanding of how to set and achieve personal goals, as well as the tools and strategies needed to overcome common obstacles which may arise along the way.  From Head to Heart: The Power of a Healthy Outlook  Clinical staff led group instruction and group discussion with PowerPoint presentation and patient guidebook. To enhance the learning environment the use of posters, models and videos may be added. Patients will be able to recognize and describe the impact of emotions and mood on physical health. They will discover the importance of self-care and explore self-care practices which may work for them. Patients will also learn how to utilize the 4 C's to cultivate a healthier outlook and better manage stress and challenges. The purpose of this lesson is to demonstrate to patients how a healthy outlook is an essential part of maintaining good health, especially as they continue their cardiac rehab journey.  Healthy Sleep for a Healthy Heart Clinical staff led group instruction and group discussion with PowerPoint presentation and patient guidebook. To enhance the learning environment the use of posters, models and videos may be added. At the conclusion of this workshop, patients will be able to demonstrate knowledge of the importance of sleep to overall health, well-being, and quality of life. They will understand the symptoms of, and treatments for, common sleep disorders. Patients will also be able to identify daytime and nighttime behaviors which impact sleep, and they will be able to apply these tools to help manage sleep-related challenges. The purpose of  this lesson is to provide patients with a general overview of sleep and outline the importance of quality sleep. Patients will learn about a few of the most common sleep disorders. Patients will also be introduced to the concept of "sleep hygiene," and discover ways to self-manage certain sleeping problems through simple daily behavior changes. Finally, the workshop will motivate patients by clarifying the links between quality sleep and their goals of heart-healthy living.  Recognizing and Reducing Stress Clinical staff led group instruction and group discussion with PowerPoint presentation and patient guidebook. To enhance the learning environment the use of posters, models and videos may be added. At the conclusion of this workshop, patients will be able to understand the types of stress reactions, differentiate between acute and chronic stress, and recognize the impact that chronic stress has on their health. They will also be able to apply different coping mechanisms, such as reframing negative self-talk. Patients will have the opportunity to practice a variety of stress management techniques, such as deep abdominal breathing, progressive muscle relaxation, and/or guided imagery.  The purpose of this lesson is to educate patients on the role of stress in their lives and to provide healthy techniques for coping with it.  Learning Barriers/Preferences:  Learning Barriers/Preferences - 03/02/24 1546       Learning Barriers/Preferences   Learning Barriers Sight    Learning Preferences Video;Pictoral;Computer/Internet          Education Topics:  Knowledge Questionnaire Score:  Knowledge Questionnaire Score - 03/02/24 1546       Knowledge Questionnaire Score   Pre Score 24/24          Core Components/Risk Factors/Patient Goals at Admission:  Personal Goals and Risk Factors at Admission - 03/02/24 1424       Core Components/Risk Factors/Patient Goals on Admission    Weight  Management Yes;Obesity;Weight Loss    Intervention Weight Management: Provide education and appropriate resources to help participant work on and attain dietary goals.;Weight Management: Develop a combined nutrition and exercise program designed to reach desired caloric intake, while maintaining appropriate intake of nutrient and fiber, sodium and fats, and appropriate energy expenditure required for the weight goal.;Weight Management/Obesity: Establish reasonable short term and long term weight goals.;Obesity: Provide education and appropriate resources to help participant work on and attain dietary goals.    Expected Outcomes Short Term: Continue to assess and modify interventions until short term weight is achieved;Long Term: Adherence to nutrition and physical activity/exercise program aimed toward attainment of established weight goal;Weight Loss: Understanding of general recommendations for a balanced deficit meal plan, which promotes 1-2 lb weight loss per week and includes a negative energy balance of [id number] kcal/d;Understanding recommendations for meals to include 15-35% energy as protein, 25-35% energy from fat, 35-60% energy from carbohydrates, less than 200mg  of dietary cholesterol, 20-35 gm of total fiber daily;Understanding of distribution of calorie intake throughout the day with the consumption of 4-5 meals/snacks    Heart Failure Yes    Intervention Provide a combined exercise and nutrition program that is supplemented with education, support and counseling about heart failure. Directed toward relieving symptoms such as shortness of breath, decreased exercise tolerance, and extremity edema.    Expected Outcomes Improve functional capacity of life;Short term: Attendance in program 2-3 days a week with increased exercise capacity. Reported lower sodium intake. Reported increased fruit and vegetable intake. Reports medication compliance.;Long term: Adoption of self-care skills and reduction of  barriers for early signs and symptoms recognition and intervention leading to self-care maintenance.;Short term: Daily weights obtained and reported for increase. Utilizing diuretic protocols set by physician.    Hypertension Yes    Intervention Provide education on lifestyle modifcations including regular physical activity/exercise, weight management, moderate sodium restriction and increased consumption of fresh fruit, vegetables, and low fat dairy, alcohol  moderation, and smoking cessation.;Monitor prescription use compliance.    Expected Outcomes Short Term: Continued assessment and intervention until BP is < 140/61mm HG in  hypertensive participants. < 130/28mm HG in hypertensive participants with diabetes, heart failure or chronic kidney disease.;Long Term: Maintenance of blood pressure at goal levels.    Lipids Yes    Intervention Provide education and support for participant on nutrition & aerobic/resistive exercise along with prescribed medications to achieve LDL 70mg , HDL >40mg .    Expected Outcomes Short Term: Participant states understanding of desired cholesterol values and is compliant with medications prescribed. Participant is following exercise prescription and nutrition guidelines.;Long Term: Cholesterol controlled with medications as prescribed, with individualized exercise RX and with personalized nutrition plan. Value goals: LDL < 70mg , HDL > 40 mg.    Stress Yes    Intervention Offer individual and/or small group education and counseling on adjustment to heart disease, stress management and health-related lifestyle change. Teach and support self-help strategies.;Refer participants experiencing significant psychosocial distress to appropriate mental health specialists for further evaluation and treatment. When possible, include family members and significant others in education/counseling sessions.    Expected Outcomes Short Term: Participant demonstrates changes in health-related  behavior, relaxation and other stress management skills, ability to obtain effective social support, and compliance with psychotropic medications if prescribed.;Long Term: Emotional wellbeing is indicated by absence of clinically significant psychosocial distress or social isolation.          Core Components/Risk Factors/Patient Goals Review:   Goals and Risk Factor Review     Row Name 03/17/24 1731 04/23/24 1317           Core Components/Risk Factors/Patient Goals Review   Personal Goals Review Weight Management/Obesity;Stress;Heart Failure;Hypertension;Lipids Weight Management/Obesity;Stress;Heart Failure;Hypertension;Lipids      Review John [name] started cardiac rehab on 03/08/24. John [name] did well on his first day of exercise and has not yet returned. John [name] started cardiac rehab on 03/08/24. John [name] did well on his first day of exercise and has not yet returned.      Expected Outcomes John [name] will continue to participate in cardiac rehab for exercise, nutrition and lifestyle modifications John [name] will continue to participate in cardiac rehab for exercise, nutrition and lifestyle modifications         Core Components/Risk Factors/Patient Goals at Discharge (Final Review):   Goals and Risk Factor Review - 04/23/24 1317       Core Components/Risk Factors/Patient Goals Review   Personal Goals Review Weight Management/Obesity;Stress;Heart Failure;Hypertension;Lipids    Review John [name] started cardiac rehab on 03/08/24. John [name] did well on his first day of exercise and has not yet returned.    Expected Outcomes John [name] will continue to participate in cardiac rehab for exercise, nutrition and lifestyle modifications          ITP Comments:  ITP Comments     Row Name 03/02/24 1427 03/17/24 1726 03/30/24 1421 04/23/24 1317     ITP Comments Wilbert Holland, MD:  Medical Director.  Introduction to the [facility] / [facility] Education Program.  Initial orientation packet  reviewed with the patient. 30 Day ITP Review. John [name] started cardiac rehab on 03/08/24 and did well on his first day of exercise. 30 Day ITP Review. John [name] started cardiac rehab on 03/08/24 and has not returned since his cataract surgery 30 Day ITP Review. John [name] started cardiac rehab on 03/08/24 and has not returned since his cataract surgery       Comments: see ITP comments

## 2024-04-26 ENCOUNTER — Encounter (HOSPITAL_COMMUNITY): Admission: RE | Admit: 2024-04-26 | Source: Ambulatory Visit

## 2024-04-27 ENCOUNTER — Telehealth (HOSPITAL_COMMUNITY): Payer: Self-pay

## 2024-04-27 NOTE — Telephone Encounter (Signed)
 Attempted to call patient to make sure he is okay as he was a no call, no show for 12:30 CR class yesterday. No answer, left message to call us  back.

## 2024-04-28 ENCOUNTER — Telehealth (HOSPITAL_COMMUNITY): Payer: Self-pay | Admitting: *Deleted

## 2024-04-28 ENCOUNTER — Encounter (HOSPITAL_COMMUNITY): Admission: RE | Admit: 2024-04-28 | Source: Ambulatory Visit

## 2024-04-28 NOTE — Telephone Encounter (Signed)
 Left message to call cardiac rehab. Will discharge due to nonattendance.Hadassah Elpidio Quan RN BSN

## 2024-04-30 ENCOUNTER — Encounter (HOSPITAL_COMMUNITY)

## 2024-05-03 ENCOUNTER — Encounter (HOSPITAL_COMMUNITY)

## 2024-05-03 ENCOUNTER — Other Ambulatory Visit: Payer: Self-pay | Admitting: Family Medicine

## 2024-05-05 ENCOUNTER — Encounter (HOSPITAL_COMMUNITY)

## 2024-05-07 ENCOUNTER — Encounter (HOSPITAL_COMMUNITY)

## 2024-05-10 ENCOUNTER — Encounter (HOSPITAL_COMMUNITY)

## 2024-05-12 ENCOUNTER — Ambulatory Visit (INDEPENDENT_AMBULATORY_CARE_PROVIDER_SITE_OTHER): Payer: Self-pay

## 2024-05-12 ENCOUNTER — Encounter (HOSPITAL_COMMUNITY)

## 2024-05-12 DIAGNOSIS — I428 Other cardiomyopathies: Secondary | ICD-10-CM

## 2024-05-14 ENCOUNTER — Encounter (HOSPITAL_COMMUNITY)

## 2024-05-16 ENCOUNTER — Other Ambulatory Visit (HOSPITAL_COMMUNITY): Payer: Self-pay | Admitting: Cardiology

## 2024-05-17 ENCOUNTER — Other Ambulatory Visit: Payer: Self-pay | Admitting: Cardiovascular Disease

## 2024-05-18 LAB — CUP PACEART REMOTE DEVICE CHECK
Battery Remaining Longevity: 78 mo
Battery Remaining Percentage: 80 %
Battery Voltage: 3.01 V
Brady Statistic AP VP Percent: 7.6 %
Brady Statistic AP VS Percent: 74 %
Brady Statistic AS VP Percent: 1 %
Brady Statistic AS VS Percent: 15 %
Brady Statistic RA Percent Paced: 75 %
Brady Statistic RV Percent Paced: 7.7 %
Date Time Interrogation Session: 20250828142925
HighPow Impedance: 93 Ohm
Implantable Lead Connection Status: 753985
Implantable Lead Connection Status: 753985
Implantable Lead Implant Date: 20190821
Implantable Lead Implant Date: 20240226
Implantable Lead Location: 753859
Implantable Lead Location: 753860
Implantable Lead Model: 7122
Implantable Pulse Generator Implant Date: 20240226
Lead Channel Impedance Value: 460 Ohm
Lead Channel Impedance Value: 480 Ohm
Lead Channel Pacing Threshold Amplitude: 0.75 V
Lead Channel Pacing Threshold Amplitude: 0.75 V
Lead Channel Pacing Threshold Pulse Width: 0.5 ms
Lead Channel Pacing Threshold Pulse Width: 0.5 ms
Lead Channel Sensing Intrinsic Amplitude: 12 mV
Lead Channel Sensing Intrinsic Amplitude: 2.9 mV
Lead Channel Setting Pacing Amplitude: 2 V
Lead Channel Setting Pacing Amplitude: 2.5 V
Lead Channel Setting Pacing Pulse Width: 0.5 ms
Lead Channel Setting Sensing Sensitivity: 0.5 mV
Pulse Gen Serial Number: 211012451
Zone Setting Status: 755011

## 2024-05-19 ENCOUNTER — Other Ambulatory Visit: Payer: Self-pay | Admitting: Cardiology

## 2024-05-19 ENCOUNTER — Encounter (HOSPITAL_COMMUNITY)

## 2024-05-21 ENCOUNTER — Encounter (HOSPITAL_COMMUNITY)

## 2024-05-24 ENCOUNTER — Encounter (HOSPITAL_COMMUNITY)

## 2024-05-25 ENCOUNTER — Ambulatory Visit (INDEPENDENT_AMBULATORY_CARE_PROVIDER_SITE_OTHER)

## 2024-05-25 ENCOUNTER — Other Ambulatory Visit: Payer: Self-pay | Admitting: Family Medicine

## 2024-05-25 VITALS — BP 118/68 | HR 68 | Temp 97.5°F | Ht 71.0 in | Wt 257.0 lb

## 2024-05-25 DIAGNOSIS — Z0000 Encounter for general adult medical examination without abnormal findings: Secondary | ICD-10-CM

## 2024-05-25 DIAGNOSIS — J4550 Severe persistent asthma, uncomplicated: Secondary | ICD-10-CM

## 2024-05-25 MED ORDER — IPRATROPIUM-ALBUTEROL 0.5-2.5 (3) MG/3ML IN SOLN: 3.0000 mL | RESPIRATORY_TRACT | 1 refills | Status: AC | PRN

## 2024-05-25 NOTE — Patient Instructions (Signed)
 John [name],  Thank you for taking the time for your Medicare Wellness Visit. I appreciate your continued commitment to your health goals. Please review the care plan we discussed, and feel free to reach out if I can assist you further.  Medicare recommends these wellness visits once per year to help you and your care team stay ahead of potential health issues. These visits are designed to focus on prevention, allowing your provider to concentrate on managing your acute and chronic conditions during your regular appointments.  Please note that Annual Wellness Visits do not include a physical exam. Some assessments may be limited, especially if the visit was conducted virtually. If needed, we may recommend a separate in-person follow-up with your provider.  Ongoing Care Seeing your primary care provider every 3 to 6 months helps us  monitor your health and provide consistent, personalized care.   Referrals If a referral was made during today's visit and you haven't received any updates within two weeks, please contact the referred provider directly to check on the status.  Recommended Screenings:  Health Maintenance  Topic Date Due   Medicare Annual Wellness Visit  Never done   Zoster (Shingles) Vaccine (1 of 2) Never done   Colon Cancer Screening  Never done   Flu Shot  04/16/2024   COVID-19 Vaccine (4 - 2025-26 season) 05/17/2024   DTaP/Tdap/Td vaccine (4 - Td or Tdap) 01/18/2033   Pneumococcal Vaccine for age over 72  Completed   Hepatitis C Screening  Completed   HIV Screening  Completed   Hepatitis B Vaccine  Aged Out   HPV Vaccine  Aged Out   Meningitis B Vaccine  Aged Out       09/05/2023    9:27 PM  Advanced Directives  Would patient like information on creating a medical advance directive? No - Patient declined   Advance Care Planning is important because it: Ensures you receive medical care that aligns with your values, goals, and preferences. Provides guidance to  your family and loved ones, reducing the emotional burden of decision-making during critical moments.  Vision: Annual vision screenings are recommended for early detection of glaucoma, cataracts, and diabetic retinopathy. These exams can also reveal signs of chronic conditions such as diabetes and high blood pressure.  Dental: Annual dental screenings help detect early signs of oral cancer, gum disease, and other conditions linked to overall health, including heart disease and diabetes.  Please see the attached documents for additional preventive care recommendations.

## 2024-05-25 NOTE — Progress Notes (Signed)
 Subjective:   John [name] is a 63 y.o. who presents for a Medicare Wellness preventive visit.  As a reminder, Annual Wellness Visits don't include a physical exam, and some assessments may be limited, especially if this visit is performed virtually. We may recommend an in-person follow-up visit with your provider if needed.  Visit Complete: In person    Persons Participating in Visit: Patient.  AWV Questionnaire: No: Patient Medicare AWV questionnaire was not completed prior to this visit.  Cardiac Risk Factors include: advanced age (>41men, >31 women);dyslipidemia;hypertension;male gender;obesity (BMI >30kg/m2)     Objective:    Today's Vitals   05/25/24 1503  BP: 118/68  Pulse: 68  Temp: (!) 97.5 F (36.4 C)  SpO2: 95%  Weight: 257 lb (116.6 kg)  Height: 5' 11 (1.803 m)   Body mass index is 35.84 kg/m.     05/25/2024    3:16 PM 09/05/2023    9:27 PM 09/05/2023    2:20 PM 05/16/2023    2:39 PM 01/18/2023   11:42 PM 11/11/2022   12:44 PM 10/07/2022    7:20 AM  Advanced Directives  Does Patient Have a Medical Advance Directive? No  No No No No No  Would patient like information on creating a medical advance directive? No - Patient declined No - Patient declined  No - Patient declined No - Patient declined No - Patient declined No - Patient declined    Current Medications (verified) Outpatient Encounter Medications as of 05/25/2024  Medication Sig   acetaminophen  (TYLENOL ) 500 MG tablet Take 1,000 mg by mouth daily.   apixaban  (ELIQUIS ) 5 MG TABS tablet Take 1 tablet (5 mg total) by mouth 2 (two) times daily.   bismuth subsalicylate (PEPTO BISMOL) 262 MG/15ML suspension Take 30 mLs by mouth every 6 (six) hours as needed for diarrhea or loose stools.   carvedilol  (COREG ) 6.25 MG tablet Take 1 tablet (6.25 mg total) by mouth 2 (two) times daily with a meal.   dapagliflozin  propanediol (FARXIGA ) 10 MG TABS tablet TAKE 1 TABLET (10 MG TOTAL) BY MOUTH DAILY. NEEDS FOLLOW  UP APPOINTMENT FOR MORE REFILLS   famotidine -calcium  carbonate-magnesium hydroxide (PEPCID  COMPLETE) 10-800-165 MG chewable tablet Chew 1 tablet by mouth 2 (two) times daily as needed.   gatifloxacin (ZYMAXID) 0.5 % SOLN Place 1 drop into the left eye 4 (four) times daily.   ipratropium-albuterol  (DUONEB) 0.5-2.5 (3) MG/3ML SOLN Take 3 mLs by nebulization every 4 (four) hours as needed.   Multiple Vitamin (MULTIVITAMIN WITH MINERALS) TABS tablet Take 1 tablet by mouth in the morning.   rosuvastatin  (CRESTOR ) 20 MG tablet Take 1 tablet (20 mg total) by mouth daily.   sacubitril -valsartan  (ENTRESTO ) 97-103 MG TAKE 1 TABLET BY MOUTH TWICE A DAY   sertraline  (ZOLOFT ) 100 MG tablet TAKE 1 TABLET BY MOUTH EVERY DAY   sildenafil  (VIAGRA ) 100 MG tablet TAKE 1 TABLET (100 MG TOTAL) BY MOUTH AT BEDTIME AS NEEDED FOR ERECTILE DYSFUNCTION.   spironolactone  (ALDACTONE ) 25 MG tablet Take 1 tablet (25 mg total) by mouth daily.   torsemide  (DEMADEX ) 20 MG tablet TAKE 2 TABLETS (40 MG TOTAL) BY MOUTH DAILY.   traZODone  (DESYREL ) 50 MG tablet TAKE 1 TABLET BY MOUTH EVERYDAY AT BEDTIME   ketorolac  (ACULAR ) 0.5 % ophthalmic solution Place 1 drop into the left eye 4 (four) times daily.   meloxicam  (MOBIC ) 15 MG tablet TAKE 1 TABLET (15 MG TOTAL) BY MOUTH DAILY. (Patient not taking: Reported on 05/25/2024)   prednisoLONE acetate (PRED  FORTE) 1 % ophthalmic suspension Place 1 drop into the left eye 4 (four) times daily.   Semaglutide -Weight Management 0.5 MG/0.5ML SOAJ Inject 0.5 mg into the skin once a week. (Patient not taking: Reported on 05/25/2024)   No facility-administered encounter medications on file as of 05/25/2024.    Allergies (verified) Chlorhexidine , Cymbalta [duloxetine hcl], and Hydrocodone    History: Past Medical History:  Diagnosis Date   AICD (automatic cardioverter/defibrillator) present    [employer]   Anxiety    Ativan    Chronic combined systolic and diastolic CHF, NYHA class 3 (HCC) 10/2016    Nonischemic cardiomyopathy. EF 20-25%.   Chronic left hip pain    Depression    history of   Dysrhythmia    A-FIB   GI bleed 03/2016   Headache    Hypertensive heart disease with combined systolic and diastolic congestive heart failure (HCC) 10/2016   Nonischemic cardiomyopathy (HCC) 10/2016   Echo with EF 20-25%. Cardiac catheterization with no CAD. LVEDP was 41 mmHg, PCWP 36 mmHg   Osteoarthritis    right shoulder   Right hand fracture    Seasonal allergies    Sleep apnea    wears a CPAP   Wears glasses    Past Surgical History:  Procedure Laterality Date   CARDIAC CATHETERIZATION     CARDIOVERSION N/A 09/01/2019   Procedure: CARDIOVERSION;  Surgeon: Rolan Ezra RAMAN, MD;  Location: [facility] ENDOSCOPY;  Service: Cardiovascular;  Laterality: N/A;   CARDIOVERSION N/A 10/07/2022   Procedure: CARDIOVERSION;  Surgeon: Sheena Pugh, DO;  Location: [facility] ENDOSCOPY;  Service: Cardiovascular;  Laterality: N/A;   ESOPHAGOGASTRODUODENOSCOPY (EGD) WITH PROPOFOL  N/A 03/20/2016   Procedure: ESOPHAGOGASTRODUODENOSCOPY (EGD) WITH PROPOFOL ;  Surgeon: Jerrell Sol, MD;  Location[facility] ENDOSCOPY;  Service: Endoscopy;  Laterality: N/A;   ICD IMPLANT N/A 05/06/2018   Procedure: ICD IMPLANT;  Surgeon: Francyne Headland, MD;  Location: [facility] INVASIVE CV LAB;  Service: Cardiovascular;  Laterality: N/A;   IRRIGATION AND DEBRIDEMENT SHOULDER Left 11/11/2019   Procedure: LEFT SHOULDER IRRIGATION AND DEBRIDEMENT WITH POLY EXCHANGE;  Surgeon: Dozier Soulier, MD;  Location: [facility] ORS;  Service: Orthopedics;  Laterality: Left;   KYPHOPLASTY N/A 02/22/2021   Procedure: KYPHOPLASTY T12;  Surgeon: Burnetta Aures, MD;  Location: [facility] OR;  Service: Orthopedics;  Laterality: N/A;  90 mins   LAPAROSCOPIC APPENDECTOMY N/A 09/06/2023   Procedure: APPENDECTOMY LAPAROSCOPIC;  Surgeon: Kinsinger, Herlene Righter, MD;  Location: [facility] OR;  Service: General;  Laterality: N/A;   LEAD REVISION/REPAIR N/A 11/11/2022   Procedure: LEAD atrial lead insertion;   Surgeon: Cindie Ole DASEN, MD;  Location: [facility] INVASIVE CV LAB;  Service: Cardiovascular;  Laterality: N/A;   ORIF WRIST FRACTURE Left 02/04/2022   Procedure: OPEN REDUCTION INTERNAL FIXATION (ORIF) WRIST FRACTURE;  Surgeon: Shari Easter, MD;  Location: [facility] OR;  Service: Orthopedics;  Laterality: Left;  with IV sedation   PPM GENERATOR CHANGEOUT N/A 11/11/2022   Procedure: PPM GENERATOR CHANGEOUT;  Surgeon: Cindie Ole DASEN, MD;  Location: [facility] INVASIVE CV LAB;  Service: Cardiovascular;  Laterality: N/A;   RIGHT/LEFT HEART CATH AND CORONARY ANGIOGRAPHY N/A 10/21/2016   Procedure: Right/Left Heart Cath and Coronary Angiography;  Surgeon: Dorn JINNY Lesches, MD;  Location: [facility] INVASIVE CV LAB;  Service: Cardiovascular: Angiographically normal coronary arteries. PCWP 33-36 mmHg, LVEDP 41 mmHg. PA pressure 60/35, mean 46 mmHg.  Cardiac output/cardiac index-3.93 /1.76 Collie), 3.49/1.57 (thermodilution)   TEE WITHOUT CARDIOVERSION N/A 09/01/2019   Procedure: TRANSESOPHAGEAL ECHOCARDIOGRAM (TEE);  Surgeon: Rolan Ezra RAMAN, MD;  Location:  [facility] ENDOSCOPY;  Service: Cardiovascular;  Laterality: N/A;   TOTAL HIP ARTHROPLASTY Left 03/27/2015   Procedure: LEFT TOTAL HIP ARTHROPLASTY ANTERIOR APPROACH;  Surgeon: Redell Shoals, MD;  Location: [facility] OR;  Service: Orthopedics;  Laterality: Left;   TOTAL HIP ARTHROPLASTY Right 05/03/2021   Procedure: TOTAL HIP ARTHROPLASTY ANTERIOR APPROACH;  Surgeon: Shoals Redell, MD;  Location: [facility] ORS;  Service: Orthopedics;  Laterality: Right;   TOTAL SHOULDER ARTHROPLASTY Right 11/24/2015   Procedure: RIGHT TOTAL SHOULDER ARTHROPLASTY;  Surgeon: Marcey Her, MD;  Location: [facility] OR;  Service: Orthopedics;  Laterality: Right;   TOTAL SHOULDER ARTHROPLASTY Left 10/19/2019   Procedure: REVERSE TOTAL SHOULDER ARTHROPLASTY;  Surgeon: Dozier Soulier, MD;  Location: [facility] ORS;  Service: Orthopedics;  Laterality: Left;   TRANSTHORACIC ECHOCARDIOGRAM  10/2016   EF 20-25%. Diffuse hypokinesis but  akinesis of the entire inferoseptal wall and apical wall. Moderate biatrial enlargement. PA pressure estimated 64 mmHg.   WISDOM TOOTH EXTRACTION     Family History  Problem Relation Age of Onset   Dementia Mother        died at 48   Lung cancer Father        smoker   Congenital heart disease Sister        lived to 45    Healthy Brother    Healthy Sister    Prostate cancer Brother        possible cancer   Healthy Brother    Healthy Brother    Social History   Socioeconomic History   Marital status: Married    Spouse name: Not on file   Number of children: 3   Years of education: Not on file   Highest education level: Not on file  Occupational History   Not on file  Tobacco Use   Smoking status: Never   Smokeless tobacco: Never  Vaping Use   Vaping status: Never Used  Substance and Sexual Activity   Alcohol  use: Yes    Alcohol /week: 12.0 standard drinks of alcohol     Types: 12 Cans of beer per week    Comment: 5 beers per day   Drug use: No   Sexual activity: Yes    Partners: Female    Comment: gf only  Other Topics Concern   Not on file  Social History Narrative   Engaged 2022- long term GF/lives with him. 2 dogs. Close with 3 sons that work for wife- christopher 15 at [employer], [place] 21, Doyle 25 in 2021.       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    Grew up in [place].    Social Drivers of Corporate investment banker Strain: Low Risk  (05/25/2024)   Overall Financial Resource Strain (CARDIA)    Difficulty of Paying Living Expenses: Not hard at all  Food Insecurity: No Food Insecurity (05/25/2024)   Hunger Vital Sign    Worried About Running Out of Food in the Last Year: Never true    Ran Out of Food in the Last Year: Never true  Transportation Needs: No Transportation Needs (05/25/2024)   PRAPARE - Administrator, Civil Service (Medical): No    Lack of  Transportation (Non-Medical): No  Physical Activity: Inactive (05/25/2024)   Exercise Vital Sign    Days of Exercise per Week: 0 days    Minutes of Exercise per Session: 0 min  Stress: No Stress Concern Present (05/25/2024)   [employer]  of Occupational Health - Occupational Stress Questionnaire    Feeling of Stress: Not at all  Social Connections: Socially Integrated (05/25/2024)   Social Connection and Isolation Panel    Frequency of Communication with Friends and Family: More than three times a week    Frequency of Social Gatherings with Friends and Family: More than three times a week    Attends Religious Services: More than 4 times per year    Active Member of [employer] or Organizations: Yes    Attends Banker Meetings: 1 to 4 times per year    Marital Status: Married    Tobacco Counseling Counseling given: Not Answered    Clinical Intake:  Pre-visit preparation completed: Yes  Pain : No/denies pain     BMI - recorded: 35.84 Nutritional Status: BMI > 30  Obese Nutritional Risks: None Diabetes: No  Lab Results  Component Value Date   HGBA1C 5.8 02/03/2024   HGBA1C 5.6 02/27/2023   HGBA1C 6.0 08/22/2022     How often do you need to have someone help you when you read instructions, pamphlets, or other written materials from your doctor or pharmacy?: 1 - Never  Interpreter Needed?: No  Information entered by :: Ellouise Haws, LPN   Activities of Daily Living     05/25/2024    3:09 PM 09/09/2023   11:36 AM  In your present state of health, do you have any difficulty performing the following activities:  Hearing? 0   Vision? 0   Difficulty concentrating or making decisions? 0   Walking or climbing stairs? 0   Dressing or bathing? 0   Doing errands, shopping? 0 0  Preparing Food and eating ? N   Using the Toilet? N   In the past six months, have you accidently leaked urine? N   Do you have problems with loss of bowel control? N   Managing your  Medications? N   Managing your Finances? N   Housekeeping or managing your Housekeeping? N     Patient Care Team: Katrinka Garnette KIDD, MD as PCP - General (Family Medicine) Croitoru, Jerel, MD as PCP - Cardiology (Cardiology)  I have updated your Care Teams any recent Medical Services you may have received from other providers in the past year.     Assessment:   This is a routine wellness examination for [place].  Hearing/Vision screen Hearing Screening - Comments:: Pt denies any hearing issues  Vision Screening - Comments:: Wears rx glasses - up to date with routine eye exams with Dr Milan    Goals Addressed             This Visit's Progress    Patient Stated       Lose weight        Depression Screen     05/25/2024    3:16 PM 03/02/2024    2:18 PM 11/13/2023    1:25 PM 05/09/2023   11:08 AM 02/18/2023   11:46 AM 08/22/2022   11:32 AM 05/17/2022    9:21 AM  PHQ 2/9 Scores  PHQ - 2 Score 0 3 0 0 0 0 0  PHQ- 9 Score  8    0 0    Fall Risk     05/25/2024    3:17 PM 03/08/2024    1:00 PM 03/02/2024    2:20 PM 11/13/2023    1:25 PM 05/09/2023   11:08 AM  Fall Risk   Falls in the past year? 0 1 1  0 0  Number falls in past yr: 0 1 1 0 0  Injury with Fall? 0 1 1 0 0  Risk for fall due to : No Fall Risks History of fall(s);Impaired vision;Impaired balance/gait;Impaired mobility History of fall(s);Impaired vision;Impaired balance/gait;Impaired mobility No Fall Risks No Fall Risks  Follow up Falls prevention discussed Falls evaluation completed Falls evaluation completed      MEDICARE RISK AT HOME:  Medicare Risk at Home Any stairs in or around the home?: Yes If so, are there any without handrails?: No Home free of loose throw rugs in walkways, pet beds, electrical cords, etc?: Yes Adequate lighting in your home to reduce risk of falls?: Yes Life alert?: No Use of a cane, walker or w/c?: No Grab bars in the bathroom?: Yes Shower chair or bench in shower?: No Elevated  toilet seat or a handicapped toilet?: No  TIMED UP AND GO:  Was the test performed?  Yes  Length of time to ambulate 10 feet: 10 sec Gait steady and fast without use of assistive device  Cognitive Function: 6CIT completed        05/25/2024    3:18 PM  6CIT Screen  What Year? 0 points  What month? 0 points  What time? 0 points  Count back from 20 0 points  Months in reverse 0 points  Repeat phrase 0 points  Total Score 0 points    Immunizations Immunization History  Administered Date(s) Administered   Influenza, Quadrivalent, Recombinant, Inj, Pf 08/05/2017   Influenza,inj,Quad PF,6+ Mos 10/19/2016, 09/03/2021   Influenza-Unspecified 06/14/2015, 10/19/2016, 05/19/2020   PFIZER(Purple Top)SARS-COV-2 Vaccination 03/30/2020, 04/30/2020, 05/19/2020   PNEUMOCOCCAL CONJUGATE-20 09/03/2021   Pneumococcal Polysaccharide-23 10/19/2016   Td 01/29/2007   Td (Adult), 2 Lf Tetanus Toxid, Preservative Free 01/29/2007   Tdap 04/14/2020, 01/19/2023    Screening Tests Health Maintenance  Topic Date Due   Zoster Vaccines- Shingrix (1 of 2) Never done   Colonoscopy  Never done   Influenza Vaccine  04/16/2024   COVID-19 Vaccine (4 - 2025-26 season) 05/17/2024   Medicare Annual Wellness (AWV)  05/25/2025   DTaP/Tdap/Td (4 - Td or Tdap) 01/18/2033   Pneumococcal Vaccine: 50+ Years  Completed   Hepatitis C Screening  Completed   HIV Screening  Completed   Hepatitis B Vaccines 19-59 Average Risk  Aged Out   HPV VACCINES  Aged Out   Meningococcal B Vaccine  Aged Out    Health Maintenance Items Addressed: See Nurse Notes at the end of this note  Additional Screening:  Vision Screening: Recommended annual ophthalmology exams for early detection of glaucoma and other disorders of the eye. Is the patient up to date with their annual eye exam?  Yes  Who is the provider or what is the name of the office in which the patient attends annual eye exams? Dr Milan   Dental Screening:  Recommended annual dental exams for proper oral hygiene  Community Resource Referral / Chronic Care Management: CRR required this visit?  No   CCM required this visit?  No   Plan:    I have personally reviewed and noted the following in the patient's chart:   Medical and social history Use of alcohol , tobacco or illicit drugs  Current medications and supplements including opioid prescriptions. Patient is not currently taking opioid prescriptions. Functional ability and status Nutritional status Physical activity Advanced directives List of other physicians Hospitalizations, surgeries, and ER visits in previous 12 months Vitals Screenings to include cognitive, depression, and falls  Referrals and appointments  In addition, I have reviewed and discussed with patient certain preventive protocols, quality metrics, and best practice recommendations. A written personalized care plan for preventive services as well as general preventive health recommendations were provided to patient.   Ellouise VEAR Haws, LPN   [id number]   After Visit Summary: (In Person-Printed) AVS printed and given to the patient  Notes: PCP Follow Up Recommendations: Pt has dry cough believes it's an allergy flare up and is requesting ipratropium-albuterol  duoneb treatment refill called in as well pt is trying to get an appt to be seen held off on flu vaccine until evaluated

## 2024-05-26 ENCOUNTER — Encounter (HOSPITAL_COMMUNITY)

## 2024-05-26 ENCOUNTER — Ambulatory Visit (INDEPENDENT_AMBULATORY_CARE_PROVIDER_SITE_OTHER): Admitting: Family Medicine

## 2024-05-26 ENCOUNTER — Encounter: Payer: Self-pay | Admitting: Family Medicine

## 2024-05-26 VITALS — BP 110/80 | HR 84 | Temp 97.7°F | Ht 71.0 in | Wt 256.4 lb

## 2024-05-26 DIAGNOSIS — E669 Obesity, unspecified: Secondary | ICD-10-CM

## 2024-05-26 DIAGNOSIS — R051 Acute cough: Secondary | ICD-10-CM | POA: Diagnosis not present

## 2024-05-26 DIAGNOSIS — J302 Other seasonal allergic rhinitis: Secondary | ICD-10-CM

## 2024-05-26 MED ORDER — METHYLPREDNISOLONE ACETATE 80 MG/ML IJ SUSP
80.0000 mg | Freq: Once | INTRAMUSCULAR | Status: AC
  Administered 2024-05-26: 80 mg via INTRAMUSCULAR

## 2024-05-26 NOTE — Patient Instructions (Addendum)
 See if insurance covers zepbound for sleep apnea.  Steroid shot given

## 2024-05-26 NOTE — Progress Notes (Signed)
 Subjective:     Patient ID: John [name], male    DOB: 31-May-1961, 63 y.o.   MRN: [id number]  Chief Complaint  Patient presents with   Cough    Has been going on for week and half now   Allergies    Pt states gets every year     HPI Discussed the use of AI scribe software for clinical note transcription with the patient, who gave verbal consent to proceed.  History of Present Illness John [name] is a 63 year old male who presents with seasonal allergies and a dry cough.  He reports symptoms that he attributes to ragweed allergies, which occur annually around this time of year. He describes a dry, itchy cough that is more pronounced at night, and notes that it keeps him up at night. He recalls being told previously that this may be because he is lying down. He also experiences tearing of the eyes when he coughs. No runny nose, congestion, sore throat, fever, chills, or shortness of breath. This episode has been ongoing for about a week and a half.  He has a history of heart failure, diagnosed after significant weight gain. He underwent an echocardiogram and subsequently had a defibrillator implanted, which was later upgraded to a device that also functions as a pacemaker. The defibrillator has activated once, over a year and a half ago, but not since.  He has sleep apnea and uses a CPAP machine, although his beard interferes with its use.  He recently underwent cataract surgery on both eyes, spaced three to four weeks apart, and had paused his Ozempic  medication during this period. He plans to resume Ozempic  for weight management, as it was effective for him previously. His weight increased significantly during the COVID-19 pandemic due to decreased activity following a hip replacement surgery.    Health Maintenance Due  Topic Date Due   Zoster Vaccines- Shingrix (1 of 2) Never done   Colonoscopy  Never done   Influenza Vaccine  04/16/2024    Past Medical  History:  Diagnosis Date   AICD (automatic cardioverter/defibrillator) present    St. Jude   Anxiety    Ativan    Chronic combined systolic and diastolic CHF, NYHA class 3 (HCC) 10/2016   Nonischemic cardiomyopathy. EF 20-25%.   Chronic left hip pain    Depression    history of   Dysrhythmia    A-FIB   GI bleed 03/2016   Headache    Hypertensive heart disease with combined systolic and diastolic congestive heart failure (HCC) 10/2016   Nonischemic cardiomyopathy (HCC) 10/2016   Echo with EF 20-25%. Cardiac catheterization with no CAD. LVEDP was 41 mmHg, PCWP 36 mmHg   Osteoarthritis    right shoulder   Right hand fracture    Seasonal allergies    Sleep apnea    wears a CPAP   Wears glasses     Past Surgical History:  Procedure Laterality Date   CARDIAC CATHETERIZATION     CARDIOVERSION N/A 09/01/2019   Procedure: CARDIOVERSION;  Surgeon: Rolan Ezra RAMAN, MD;  Location: [facility] ENDOSCOPY;  Service: Cardiovascular;  Laterality: N/A;   CARDIOVERSION N/A 10/07/2022   Procedure: CARDIOVERSION;  Surgeon: Sheena Pugh, DO;  Location: [facility] ENDOSCOPY;  Service: Cardiovascular;  Laterality: N/A;   CATARACT EXTRACTION Bilateral    ESOPHAGOGASTRODUODENOSCOPY (EGD) WITH PROPOFOL  N/A 03/20/2016   Procedure: ESOPHAGOGASTRODUODENOSCOPY (EGD) WITH PROPOFOL ;  Surgeon: Jerrell Sol, MD;  Location: [facility] ENDOSCOPY;  Service: Endoscopy;  Laterality: N/A;  ICD IMPLANT N/A 05/06/2018   Procedure: ICD IMPLANT;  Surgeon: Francyne Headland, MD;  Location: [facility] INVASIVE CV LAB;  Service: Cardiovascular;  Laterality: N/A;   IRRIGATION AND DEBRIDEMENT SHOULDER Left 11/11/2019   Procedure: LEFT SHOULDER IRRIGATION AND DEBRIDEMENT WITH POLY EXCHANGE;  Surgeon: Dozier Soulier, MD;  Location: [facility] ORS;  Service: Orthopedics;  Laterality: Left;   KYPHOPLASTY N/A 02/22/2021   Procedure: KYPHOPLASTY T12;  Surgeon: Burnetta Aures, MD;  Location: [facility] OR;  Service: Orthopedics;  Laterality: N/A;  90 mins   LAPAROSCOPIC  APPENDECTOMY N/A 09/06/2023   Procedure: APPENDECTOMY LAPAROSCOPIC;  Surgeon: Kinsinger, Herlene Righter, MD;  Location: [facility] OR;  Service: General;  Laterality: N/A;   LEAD REVISION/REPAIR N/A 11/11/2022   Procedure: LEAD atrial lead insertion;  Surgeon: Cindie Ole DASEN, MD;  Location: [facility] INVASIVE CV LAB;  Service: Cardiovascular;  Laterality: N/A;   ORIF WRIST FRACTURE Left 02/04/2022   Procedure: OPEN REDUCTION INTERNAL FIXATION (ORIF) WRIST FRACTURE;  Surgeon: Shari Easter, MD;  Location: [facility] OR;  Service: Orthopedics;  Laterality: Left;  with IV sedation   PPM GENERATOR CHANGEOUT N/A 11/11/2022   Procedure: PPM GENERATOR CHANGEOUT;  Surgeon: Cindie Ole DASEN, MD;  Location: [facility] INVASIVE CV LAB;  Service: Cardiovascular;  Laterality: N/A;   RIGHT/LEFT HEART CATH AND CORONARY ANGIOGRAPHY N/A 10/21/2016   Procedure: Right/Left Heart Cath and Coronary Angiography;  Surgeon: Dorn JINNY Lesches, MD;  Location: [facility] INVASIVE CV LAB;  Service: Cardiovascular: Angiographically normal coronary arteries. PCWP 33-36 mmHg, LVEDP 41 mmHg. PA pressure 60/35, mean 46 mmHg.  Cardiac output/cardiac index-3.93 /1.76 Collie), 3.49/1.57 (thermodilution)   TEE WITHOUT CARDIOVERSION N/A 09/01/2019   Procedure: TRANSESOPHAGEAL ECHOCARDIOGRAM (TEE);  Surgeon: Rolan Ezra RAMAN, MD;  Location: [facility] ENDOSCOPY;  Service: Cardiovascular;  Laterality: N/A;   TOTAL HIP ARTHROPLASTY Left 03/27/2015   Procedure: LEFT TOTAL HIP ARTHROPLASTY ANTERIOR APPROACH;  Surgeon: Redell Shoals, MD;  [facility]: [facility] OR;  Service: Orthopedics;  Laterality: Left;   TOTAL HIP ARTHROPLASTY Right 05/03/2021   Procedure: TOTAL HIP ARTHROPLASTY ANTERIOR APPROACH;  Surgeon: Shoals Redell, MD;  Location: [facility] ORS;  Service: Orthopedics;  Laterality: Right;   TOTAL SHOULDER ARTHROPLASTY Right 11/24/2015   Procedure: RIGHT TOTAL SHOULDER ARTHROPLASTY;  Surgeon: Marcey Her, MD;  Location: [facility] OR;  Service: Orthopedics;  Laterality: Right;   TOTAL SHOULDER  ARTHROPLASTY Left 10/19/2019   Procedure: REVERSE TOTAL SHOULDER ARTHROPLASTY;  Surgeon: Dozier Soulier, MD;  Location: [facility] ORS;  Service: Orthopedics;  Laterality: Left;   TRANSTHORACIC ECHOCARDIOGRAM  10/2016   EF 20-25%. Diffuse hypokinesis but akinesis of the entire inferoseptal wall and apical wall. Moderate biatrial enlargement. PA pressure estimated 64 mmHg.   WISDOM TOOTH EXTRACTION       Current Outpatient Medications:    acetaminophen  (TYLENOL ) 500 MG tablet, Take 1,000 mg by mouth daily., Disp: , Rfl:    apixaban  (ELIQUIS ) 5 MG TABS tablet, Take 1 tablet (5 mg total) by mouth 2 (two) times daily., Disp: 28 tablet, Rfl: 0   bismuth subsalicylate (PEPTO BISMOL) 262 MG/15ML suspension, Take 30 mLs by mouth every 6 (six) hours as needed for diarrhea or loose stools., Disp: , Rfl:    carvedilol  (COREG ) 6.25 MG tablet, Take 1 tablet (6.25 mg total) by mouth 2 (two) times daily with a meal., Disp: 180 tablet, Rfl: 3   dapagliflozin  propanediol (FARXIGA ) 10 MG TABS tablet, TAKE 1 TABLET (10 MG TOTAL) BY MOUTH DAILY. NEEDS FOLLOW UP APPOINTMENT FOR MORE REFILLS, Disp: 90 tablet, Rfl: 3   famotidine -calcium  carbonate-magnesium hydroxide (PEPCID   COMPLETE) 10-800-165 MG chewable tablet, Chew 1 tablet by mouth 2 (two) times daily as needed., Disp: 100 tablet, Rfl: 11   gatifloxacin (ZYMAXID) 0.5 % SOLN, Place 1 drop into the left eye 4 (four) times daily., Disp: , Rfl:    ipratropium-albuterol  (DUONEB) 0.5-2.5 (3) MG/3ML SOLN, Take 3 mLs by nebulization every 4 (four) hours as needed., Disp: 30 mL, Rfl: 1   ketorolac  (ACULAR ) 0.5 % ophthalmic solution, Place 1 drop into the left eye 4 (four) times daily., Disp: , Rfl:    meloxicam  (MOBIC ) 15 MG tablet, TAKE 1 TABLET (15 MG TOTAL) BY MOUTH DAILY. (Patient not taking: Reported on 05/25/2024), Disp: 30 tablet, Rfl: 0   Multiple Vitamin (MULTIVITAMIN WITH MINERALS) TABS tablet, Take 1 tablet by mouth in the morning., Disp: , Rfl:    prednisoLONE acetate  (PRED FORTE) 1 % ophthalmic suspension, Place 1 drop into the left eye 4 (four) times daily., Disp: , Rfl:    rosuvastatin  (CRESTOR ) 20 MG tablet, Take 1 tablet (20 mg total) by mouth daily., Disp: 90 tablet, Rfl: 2   sacubitril -valsartan  (ENTRESTO ) 97-103 MG, TAKE 1 TABLET BY MOUTH TWICE A DAY, Disp: 180 tablet, Rfl: 2   Semaglutide -Weight Management 0.5 MG/0.5ML SOAJ, Inject 0.5 mg into the skin once a week. (Patient not taking: Reported on 05/25/2024), Disp: , Rfl:    sertraline  (ZOLOFT ) 100 MG tablet, TAKE 1 TABLET BY MOUTH EVERY DAY, Disp: 30 tablet, Rfl: 11   sildenafil  (VIAGRA ) 100 MG tablet, TAKE 1 TABLET (100 MG TOTAL) BY MOUTH AT BEDTIME AS NEEDED FOR ERECTILE DYSFUNCTION., Disp: 90 tablet, Rfl: 3   spironolactone  (ALDACTONE ) 25 MG tablet, Take 1 tablet (25 mg total) by mouth daily., Disp: , Rfl:    torsemide  (DEMADEX ) 20 MG tablet, TAKE 2 TABLETS (40 MG TOTAL) BY MOUTH DAILY., Disp: 180 tablet, Rfl: 0   traZODone  (DESYREL ) 50 MG tablet, TAKE 1 TABLET BY MOUTH EVERYDAY AT BEDTIME, Disp: 90 tablet, Rfl: 3  Allergies  Allergen Reactions   Chlorhexidine  Itching   Cymbalta [Duloxetine Hcl] Nausea Only and Other (See Comments)    Night sweats     Hydrocodone  Rash, Dermatitis and Itching   ROS neg/noncontributory except as noted HPI/below      Objective:     BP 110/80   Pulse 84   Temp 97.7 F (36.5 C) (Temporal)   Ht 5' 11 (1.803 m)   Wt 256 lb 6.4 oz (116.3 kg)   SpO2 98%   BMI 35.76 kg/m  Wt Readings from Last 3 Encounters:  05/26/24 256 lb 6.4 oz (116.3 kg)  05/25/24 257 lb (116.6 kg)  03/02/24 253 lb 12 oz (115.1 kg)    Physical Exam   Gen: WDWN NAD HEENT: NCAT, conjunctiva not injected, sclera nonicteric TM WNL B, OP moist, no exudates  NECK:  supple, no thyromegaly, no nodes, CARDIAC: RRR, S1S2+, no murmur.  LUNGS: CTAB. No wheezes EXT:  no edema MSK: no gross abnormalities.  NEURO: A&O x3.  CN II-XII intact.  PSYCH: normal mood. Good eye contact      Assessment & Plan:  Acute cough -     methylPREDNISolone  Acetate  Assessment and Plan Assessment & Plan Seasonal allergic rhinitis   He experiences recurrent seasonal allergic rhinitis, likely due to ragweed, with symptoms including a dry, itchy cough and tearing eyes, more pronounced at night. There is no shortness of breath, runny nose, congestion, sore throat, fever, or chills. Symptoms have been present for about a week and a  half. Administer a steroid injection for symptom relief.  Obstructive sleep apnea   His obstructive sleep apnea is managed with CPAP, though compliance is affected by facial hair. There was a discussion about potential insurance coverage for ZepBound. Contact insurance to check coverage for ZepBound. If covered, schedule an appointment with Doctor Katrinka for authorization.  Heart failure with cardiac pacemaker and defibrillator in situ   He has heart failure with a combination pacemaker and defibrillator in situ. There have been no recent episodes of device activation. Clinically no in chf  Obesity   He is obese with weight gain exacerbated by lifestyle changes during the COVID-19 pandemic and limited physical activity due to hip issues. Previously managed with Ozempic , which was paused for cataract surgery. Resume Ozempic  for weight management.    Return if symptoms worsen or fail to improve.  Jenkins CHRISTELLA Carrel, MD

## 2024-05-30 ENCOUNTER — Ambulatory Visit: Payer: Self-pay | Admitting: Cardiovascular Disease

## 2024-06-17 ENCOUNTER — Other Ambulatory Visit (HOSPITAL_COMMUNITY): Payer: Self-pay | Admitting: Cardiology

## 2024-07-29 ENCOUNTER — Other Ambulatory Visit: Payer: Self-pay | Admitting: Family Medicine

## 2024-07-29 ENCOUNTER — Ambulatory Visit: Admitting: Family

## 2024-07-29 ENCOUNTER — Encounter: Payer: Self-pay | Admitting: Family

## 2024-07-29 VITALS — BP 118/72 | HR 66 | Temp 97.2°F | Ht 71.0 in | Wt 251.8 lb

## 2024-07-29 DIAGNOSIS — J301 Allergic rhinitis due to pollen: Secondary | ICD-10-CM | POA: Diagnosis not present

## 2024-07-29 MED ORDER — METHYLPREDNISOLONE ACETATE 40 MG/ML IJ SUSP
60.0000 mg | Freq: Once | INTRAMUSCULAR | Status: AC
  Administered 2024-07-29: 60 mg via INTRAMUSCULAR

## 2024-07-29 MED ORDER — TRIAMCINOLONE ACETONIDE 55 MCG/ACT NA AERO: 1.0000 | INHALATION_SPRAY | Freq: Every day | NASAL | 2 refills | Status: DC

## 2024-07-29 NOTE — Progress Notes (Signed)
 Patient ID: John [name], male    DOB: January 10, 1961, 63 y.o.   MRN: [id number]  Chief Complaint  Patient presents with   Cough    Pt c/o dry cough and scratchy throat. Sx present for 2 weeks, Has tried claritin .   Discussed the use of AI scribe software for clinical note transcription with the patient, who gave verbal consent to proceed.  History of Present Illness John [name] is a 63 year old male who presents with allergy symptoms and cough.  He experiences an itchy and scratchy throat, with increased throat mucus, primarily at night when lying down, recurring with seasonal allergy changes. He previously received a steroid shot, which was effective, but is not currently using nasal sprays or antihistamines other than Claritin  for a short time. He has asthma and uses a nebulizer for chest tightness or increased coughing r/t chronic bronchitis. He denies breathing difficulties and is not using a handheld inhaler. He takes torsemide  daily for CHF.  Assessment & Plan Allergic rhinitis due to pollen Chronic allergic rhinitis with seasonal symptoms related to pollen. Previous steroid injections provided temporary relief. Eyes water  badly when coughing and he has itchy throat when breathing deeply. Lungs clear on exam. - Administered lower dose of steroid injection today, DepoMedrol 60mg . - Prescribed nasal spray (Nasacort) for daily use, one squirt each side.  Severe persistent bronchitis Managed with nebulizer treatments as needed. Symptoms include coughing and throat irritation, exacerbated by deep breathing. - Continue nebulizer treatments as needed for chest tightness or increased coughing.   Subjective:    Outpatient Medications Prior to Visit  Medication Sig Dispense Refill   acetaminophen  (TYLENOL ) 500 MG tablet Take 1,000 mg by mouth daily.     apixaban  (ELIQUIS ) 5 MG TABS tablet Take 1 tablet (5 mg total) by mouth 2 (two) times daily. PLEASE SCHEDULE APPOINTMENT  FOR MORE REFILLS 60 tablet 1   bismuth subsalicylate (PEPTO BISMOL) 262 MG/15ML suspension Take 30 mLs by mouth every 6 (six) hours as needed for diarrhea or loose stools.     carvedilol  (COREG ) 6.25 MG tablet Take 1 tablet (6.25 mg total) by mouth 2 (two) times daily with a meal. 180 tablet 3   dapagliflozin  propanediol (FARXIGA ) 10 MG TABS tablet TAKE 1 TABLET (10 MG TOTAL) BY MOUTH DAILY. NEEDS FOLLOW UP APPOINTMENT FOR MORE REFILLS 90 tablet 3   famotidine -calcium  carbonate-magnesium hydroxide (PEPCID  COMPLETE) 10-800-165 MG chewable tablet Chew 1 tablet by mouth 2 (two) times daily as needed. 100 tablet 11   gatifloxacin (ZYMAXID) 0.5 % SOLN Place 1 drop into the left eye 4 (four) times daily.     ipratropium-albuterol  (DUONEB) 0.5-2.5 (3) MG/3ML SOLN Take 3 mLs by nebulization every 4 (four) hours as needed. 30 mL 1   ketorolac  (ACULAR ) 0.5 % ophthalmic solution Place 1 drop into the left eye 4 (four) times daily.     Multiple Vitamin (MULTIVITAMIN WITH MINERALS) TABS tablet Take 1 tablet by mouth in the morning.     prednisoLONE acetate (PRED FORTE) 1 % ophthalmic suspension Place 1 drop into the left eye 4 (four) times daily.     rosuvastatin  (CRESTOR ) 20 MG tablet Take 1 tablet (20 mg total) by mouth daily. 90 tablet 2   sacubitril -valsartan  (ENTRESTO ) 97-103 MG TAKE 1 TABLET BY MOUTH TWICE A DAY 180 tablet 2   sertraline  (ZOLOFT ) 100 MG tablet TAKE 1 TABLET BY MOUTH EVERY DAY 90 tablet 3   sildenafil  (VIAGRA ) 100 MG tablet TAKE 1 TABLET (100 MG  TOTAL) BY MOUTH AT BEDTIME AS NEEDED FOR ERECTILE DYSFUNCTION. 90 tablet 3   spironolactone  (ALDACTONE ) 25 MG tablet Take 1 tablet (25 mg total) by mouth daily.     torsemide  (DEMADEX ) 20 MG tablet TAKE 2 TABLETS (40 MG TOTAL) BY MOUTH DAILY. 180 tablet 0   traZODone  (DESYREL ) 50 MG tablet TAKE 1 TABLET BY MOUTH EVERYDAY AT BEDTIME 90 tablet 3   meloxicam  (MOBIC ) 15 MG tablet TAKE 1 TABLET (15 MG TOTAL) BY MOUTH DAILY. (Patient not taking: Reported  on 05/25/2024) 30 tablet 0   Semaglutide -Weight Management 0.5 MG/0.5ML SOAJ Inject 0.5 mg into the skin once a week. (Patient not taking: Reported on 05/25/2024)     No facility-administered medications prior to visit.   Past Medical History:  Diagnosis Date   AICD (automatic cardioverter/defibrillator) present    St. Jude   Anxiety    Ativan    Chronic combined systolic and diastolic CHF, NYHA class 3 (HCC) 10/2016   Nonischemic cardiomyopathy. EF 20-25%.   Chronic left hip pain    Depression    history of   Dysrhythmia    A-FIB   GI bleed 03/2016   Headache    Hypertensive heart disease with combined systolic and diastolic congestive heart failure (HCC) 10/2016   Nonischemic cardiomyopathy (HCC) 10/2016   Echo with EF 20-25%. Cardiac catheterization with no CAD. LVEDP was 41 mmHg, PCWP 36 mmHg   Osteoarthritis    right shoulder   Right hand fracture    Seasonal allergies    Sleep apnea    wears a CPAP   Wears glasses    Past Surgical History:  Procedure Laterality Date   CARDIAC CATHETERIZATION     CARDIOVERSION N/A 09/01/2019   Procedure: CARDIOVERSION;  Surgeon: Rolan Ezra RAMAN, MD;  Location: [facility] ENDOSCOPY;  Service: Cardiovascular;  Laterality: N/A;   CARDIOVERSION N/A 10/07/2022   Procedure: CARDIOVERSION;  Surgeon: Sheena Pugh, DO;  Location: [facility] ENDOSCOPY;  Service: Cardiovascular;  Laterality: N/A;   CATARACT EXTRACTION Bilateral    ESOPHAGOGASTRODUODENOSCOPY (EGD) WITH PROPOFOL  N/A 03/20/2016   Procedure: ESOPHAGOGASTRODUODENOSCOPY (EGD) WITH PROPOFOL ;  Surgeon: Jerrell Sol, MD;  Location: [facility] ENDOSCOPY;  Service: Endoscopy;  Laterality: N/A;   ICD IMPLANT N/A 05/06/2018   Procedure: ICD IMPLANT;  Surgeon: Francyne Headland, MD;  Location: [facility] INVASIVE CV LAB;  Service: Cardiovascular;  Laterality: N/A;   IRRIGATION AND DEBRIDEMENT SHOULDER Left 11/11/2019   Procedure: LEFT SHOULDER IRRIGATION AND DEBRIDEMENT WITH POLY EXCHANGE;  Surgeon: Dozier Soulier, MD;   Location: [facility] ORS;  Service: Orthopedics;  Laterality: Left;   KYPHOPLASTY N/A 02/22/2021   Procedure: KYPHOPLASTY T12;  Surgeon: Burnetta Aures, MD;  Location: [facility] OR;  Service: Orthopedics;  Laterality: N/A;  90 mins   LAPAROSCOPIC APPENDECTOMY N/A 09/06/2023   Procedure: APPENDECTOMY LAPAROSCOPIC;  Surgeon: Kinsinger, Herlene Righter, MD;  Location: [facility] OR;  Service: General;  Laterality: N/A;   LEAD REVISION/REPAIR N/A 11/11/2022   Procedure: LEAD atrial lead insertion;  Surgeon: Cindie Ole DASEN, MD;  [facility]: [facility] INVASIVE CV LAB;  Service: Cardiovascular;  Laterality: N/A;   ORIF WRIST FRACTURE Left 02/04/2022   Procedure: OPEN REDUCTION INTERNAL FIXATION (ORIF) WRIST FRACTURE;  Surgeon: Shari Easter, MD;  Location: [facility] OR;  Service: Orthopedics;  Laterality: Left;  with IV sedation   PPM GENERATOR CHANGEOUT N/A 11/11/2022   Procedure: PPM GENERATOR CHANGEOUT;  Surgeon: Cindie Ole DASEN, MD;  Location: [facility] INVASIVE CV LAB;  Service: Cardiovascular;  Laterality: N/A;   RIGHT/LEFT HEART CATH AND CORONARY ANGIOGRAPHY N/A  10/21/2016   Procedure: Right/Left Heart Cath and Coronary Angiography;  Surgeon: Dorn JINNY Lesches, MD;  Location: [facility] INVASIVE CV LAB;  Service: Cardiovascular: Angiographically normal coronary arteries. PCWP 33-36 mmHg, LVEDP 41 mmHg. PA pressure 60/35, mean 46 mmHg.  Cardiac output/cardiac index-3.93 /1.76 Collie), 3.49/1.57 (thermodilution)   TEE WITHOUT CARDIOVERSION N/A 09/01/2019   Procedure: TRANSESOPHAGEAL ECHOCARDIOGRAM (TEE);  Surgeon: Rolan Ezra RAMAN, MD;  Location: [facility] ENDOSCOPY;  Service: Cardiovascular;  Laterality: N/A;   TOTAL HIP ARTHROPLASTY Left 03/27/2015   Procedure: LEFT TOTAL HIP ARTHROPLASTY ANTERIOR APPROACH;  Surgeon: Redell Shoals, MD;  Location: [facility] OR;  Service: Orthopedics;  Laterality: Left;   TOTAL HIP ARTHROPLASTY Right 05/03/2021   Procedure: TOTAL HIP ARTHROPLASTY ANTERIOR APPROACH;  Surgeon: Shoals Redell, MD;  Location: [facility] ORS;  Service:  Orthopedics;  Laterality: Right;   TOTAL SHOULDER ARTHROPLASTY Right 11/24/2015   Procedure: RIGHT TOTAL SHOULDER ARTHROPLASTY;  Surgeon: Marcey Her, MD;  Location: [facility] OR;  Service: Orthopedics;  Laterality: Right;   TOTAL SHOULDER ARTHROPLASTY Left 10/19/2019   Procedure: REVERSE TOTAL SHOULDER ARTHROPLASTY;  Surgeon: Dozier Soulier, MD;  Location: [facility] ORS;  Service: Orthopedics;  Laterality: Left;   TRANSTHORACIC ECHOCARDIOGRAM  10/2016   EF 20-25%. Diffuse hypokinesis but akinesis of the entire inferoseptal wall and apical wall. Moderate biatrial enlargement. PA pressure estimated 64 mmHg.   WISDOM TOOTH EXTRACTION     Allergies  Allergen Reactions   Chlorhexidine  Itching   Cymbalta [Duloxetine Hcl] Nausea Only and Other (See Comments)    Night sweats     Hydrocodone  Rash, Dermatitis and Itching      Objective:    Physical Exam Vitals and nursing note reviewed.  Constitutional:      General: He is not in acute distress.    Appearance: Normal appearance.  HENT:     Head: Normocephalic.     Right Ear: Tympanic membrane and ear canal normal.     Left Ear: Tympanic membrane and ear canal normal.     Nose:     Right Sinus: Frontal sinus tenderness present. No maxillary sinus tenderness.     Left Sinus: Frontal sinus tenderness present. No maxillary sinus tenderness.     Mouth/Throat:     Mouth: Mucous membranes are moist.     Pharynx: No pharyngeal swelling, oropharyngeal exudate, posterior oropharyngeal erythema or uvula swelling.     Tonsils: No tonsillar exudate or tonsillar abscesses.  Cardiovascular:     Rate and Rhythm: Normal rate and regular rhythm.  Pulmonary:     Effort: Pulmonary effort is normal.     Breath sounds: Normal breath sounds.  Musculoskeletal:        General: Normal range of motion.     Cervical back: Normal range of motion.  Lymphadenopathy:     Head:     Right side of head: No preauricular or posterior auricular adenopathy.     Left side of  head: No preauricular or posterior auricular adenopathy.     Cervical: No cervical adenopathy.  Skin:    General: Skin is warm and dry.  Neurological:     Mental Status: He is alert and oriented to person, place, and time.  Psychiatric:        Mood and Affect: Mood normal.    BP 118/72 (BP Location: Left Arm, Patient Position: Sitting, Cuff Size: Large)   Pulse 66   Temp (!) 97.2 F (36.2 C) (Temporal)   Ht 5' 11 (1.803 m)   Wt 251 lb 12.8 oz (114.2  kg)   SpO2 93%   BMI 35.12 kg/m  Wt Readings from Last 3 Encounters:  07/29/24 251 lb 12.8 oz (114.2 kg)  05/26/24 256 lb 6.4 oz (116.3 kg)  05/25/24 257 lb (116.6 kg)       Lucius Krabbe, NP

## 2024-08-11 ENCOUNTER — Ambulatory Visit: Payer: Self-pay

## 2024-08-11 DIAGNOSIS — I428 Other cardiomyopathies: Secondary | ICD-10-CM | POA: Diagnosis not present

## 2024-08-13 LAB — CUP PACEART REMOTE DEVICE CHECK
Battery Remaining Longevity: 76 mo
Battery Remaining Percentage: 78 %
Battery Voltage: 2.99 V
Brady Statistic AP VP Percent: 8.1 %
Brady Statistic AP VS Percent: 74 %
Brady Statistic AS VP Percent: 1 %
Brady Statistic AS VS Percent: 14 %
Brady Statistic RA Percent Paced: 76 %
Brady Statistic RV Percent Paced: 8.1 %
Date Time Interrogation Session: 20251127180801
HighPow Impedance: 95 Ohm
Implantable Lead Connection Status: 753985
Implantable Lead Connection Status: 753985
Implantable Lead Implant Date: 20190821
Implantable Lead Implant Date: 20240226
Implantable Lead Location: 753859
Implantable Lead Location: 753860
Implantable Lead Model: 7122
Implantable Pulse Generator Implant Date: 20240226
Lead Channel Impedance Value: 460 Ohm
Lead Channel Impedance Value: 500 Ohm
Lead Channel Pacing Threshold Amplitude: 0.75 V
Lead Channel Pacing Threshold Amplitude: 0.75 V
Lead Channel Pacing Threshold Pulse Width: 0.5 ms
Lead Channel Pacing Threshold Pulse Width: 0.5 ms
Lead Channel Sensing Intrinsic Amplitude: 12 mV
Lead Channel Sensing Intrinsic Amplitude: 2.9 mV
Lead Channel Setting Pacing Amplitude: 2 V
Lead Channel Setting Pacing Amplitude: 2.5 V
Lead Channel Setting Pacing Pulse Width: 0.5 ms
Lead Channel Setting Sensing Sensitivity: 0.5 mV
Pulse Gen Serial Number: 211012451
Zone Setting Status: 755011

## 2024-08-16 ENCOUNTER — Ambulatory Visit: Admitting: Family Medicine

## 2024-08-16 ENCOUNTER — Encounter: Payer: Self-pay | Admitting: Family Medicine

## 2024-08-16 ENCOUNTER — Ambulatory Visit: Payer: Self-pay

## 2024-08-16 VITALS — BP 134/78 | HR 83 | Temp 97.7°F | Ht 71.0 in | Wt 256.0 lb

## 2024-08-16 DIAGNOSIS — I5042 Chronic combined systolic (congestive) and diastolic (congestive) heart failure: Secondary | ICD-10-CM

## 2024-08-16 DIAGNOSIS — I480 Paroxysmal atrial fibrillation: Secondary | ICD-10-CM

## 2024-08-16 DIAGNOSIS — R051 Acute cough: Secondary | ICD-10-CM | POA: Diagnosis not present

## 2024-08-16 DIAGNOSIS — U071 COVID-19: Secondary | ICD-10-CM

## 2024-08-16 DIAGNOSIS — I10 Essential (primary) hypertension: Secondary | ICD-10-CM

## 2024-08-16 LAB — POC COVID19 BINAXNOW: SARS Coronavirus 2 Ag: POSITIVE — AB

## 2024-08-16 LAB — POCT INFLUENZA A/B
Influenza A, POC: NEGATIVE
Influenza B, POC: NEGATIVE

## 2024-08-16 MED ORDER — PREDNISONE 20 MG PO TABS: ORAL_TABLET | ORAL | 0 refills | Status: DC

## 2024-08-16 MED ORDER — ALBUTEROL SULFATE HFA 108 (90 BASE) MCG/ACT IN AERS: 2.0000 | INHALATION_SPRAY | Freq: Four times a day (QID) | RESPIRATORY_TRACT | 2 refills | Status: AC | PRN

## 2024-08-16 MED ORDER — MOLNUPIRAVIR EUA 200MG CAPSULE: 4.0000 | ORAL_CAPSULE | Freq: Two times a day (BID) | ORAL | 0 refills | Status: AC

## 2024-08-16 NOTE — Patient Instructions (Addendum)
 Patient with testing confirming covid 19 with first day of covid 19 symptoms  Friday November 28th.  Vaccination status: last vaccine was 2021   Therefore: - recommended patient watch closely for shortness of breath or confusion or worsening symptoms and if those occur patient should contact us  immediately or seek care in the emergency department -recommended patient consider purchasing pulse oximeter and if levels 94% or below persistently- seek care at the hospital (very unlikely for this to happen without significant underlying respiratory disease) - Isolation guidelines per CDC as of march 2024- The recommendations suggest returning to normal activities when, for at least 24 hours, symptoms are improving overall, and if a fever was present, it has been gone without use of a fever-reducing medication. Once people resume normal activities, they are encouraged to take additional prevention strategies for the next 5 days to curb disease spread, such as taking more steps for cleaner air, enhancing hygiene practices, wearing a well-fitting mask, keeping a distance from others, and/or getting tested for respiratory viruses. -Patient should inform close contacts about exposure (anyone patient been around unmasked for more than 15 minutes)    -since High risk for complications-we discussed outpatient therapeutic options including paxlovid (and risk of rebound)- but in his case not a candidate so we opted for molnupiravir - patient opted for prednisone  with his wheeze as well- reports doing well with this in past with bronchitic symptoms and wheeze. Hed like to have albuterol  available as well for the wheeze- if worsening symptoms seek care  Recommended follow up: Return for as needed for new, worsening, persistent symptoms.

## 2024-08-16 NOTE — Telephone Encounter (Signed)
 I agree with need for office visit at minimum- he can use urgent care if he desires but we could try to see him in one of our offices as well- I'm concerned with him refusing it may be harder to get him in

## 2024-08-16 NOTE — Telephone Encounter (Signed)
 FYI Only or Action Required?: Action required by provider: medication refill request. Pt declines OV, asking to have abx sent to pharmacy on file and be called/notified if it can or can't be sent in. Phone: [phone] , Pharmacy: [id number] [place], [place] - [address]   Patient was last seen in primary care on 07/29/2024 by Lucius Krabbe, NP.  Called Nurse Triage reporting Cough and Breathing Problem.  Symptoms began several days ago.  Interventions attempted: Nothing.  Symptoms are: stable.  Triage Disposition: See HCP Within 4 Hours (Or PCP Triage)  Patient/caregiver understands and will follow disposition?: No, wishes to speak with PCP  Copied from CRM #[id number]. Topic: Clinical - Red Word Triage >> Aug 16, 2024  1:00 PM Franky GRADE wrote: Red Word that prompted transfer to Nurse Triage: Patient is calling with Bronchitis symptoms that started 3 days ago and have worsened. Reason for Disposition  [1] MILD difficulty breathing (e.g., minimal/no SOB at rest, SOB with walking, pulse < 100) AND [2] NEW-onset or WORSE than normal    New moderate SOB, no SOB at baseline. Speaking in clear full sentences. Reports recurrent chronic hx of bronchitis with same symptoms  Answer Assessment - Initial Assessment Questions Pt reports onset of moderate SOB with cough with green mucus 1 week ago. Some mild wheezing. Denies coughing up blood. Unsure if he's had fever, has not checked temperature. Had night sweats last night. Speaking in clear full continuous sentences, breathing sounds non-labored over the phone and no wheezing heard. No using inhalers or neb tx, currently out. Reports hx of recurrent bronchitis since he was a child with symptoms same as current. Asking to have abx sent in, declines office visit. States he will not go to ED. Called [facility], currently on lunch. Forwarding HP message to office with request and to call pt if abx can be sent in or if he will need appt. Advised UC or  ED for worsening symptoms.   1. RESPIRATORY STATUS: Describe your breathing? (e.g., wheezing, shortness of breath, unable to speak, severe coughing)      Pt reports onset of moderate SOB, having to sit up in chair. Has some wheezing.   2. ONSET: When did this breathing problem begin?      3 days ago  3. PATTERN Does the difficult breathing come and go, or has it been constant since it started?      Constant  4. SEVERITY: How bad is your breathing? (e.g., mild, moderate, severe)      Moderate SOB  5. RECURRENT SYMPTOM: Have you had difficulty breathing before? If Yes, ask: When was the last time? and What happened that time?      Hx of bronchitis with same current symptoms  6. CARDIAC HISTORY: Do you have any history of heart disease? (e.g., heart attack, angina, bypass surgery, angioplasty)      CHF, afib, VT  7. LUNG HISTORY: Do you have any history of lung disease?  (e.g., pulmonary embolus, asthma, emphysema)     Hx bronchitis  8. CAUSE: What do you think is causing the breathing problem?      Bronchitis  9. OTHER SYMPTOMS: Do you have any other symptoms? (e.g., chest pain, cough, dizziness, fever, runny nose)     Cough, runny nose. Reports rib pain when breathing in. Unsure if he's had fever, has not checked temperature. Had night sweats last night.  10. O2 SATURATION MONITOR:  Do you use an oxygen  saturation monitor (pulse oximeter) at  home? If Yes, ask: What is your reading (oxygen  level) today? What is your usual oxygen  saturation reading? (e.g., 95%)       Denies. Does not wear O2.  Protocols used: Breathing Difficulty-A-AH

## 2024-08-16 NOTE — Progress Notes (Signed)
 Phone [phone] In person visit   Subjective:   John [name] is a 63 y.o. year old very pleasant male patient who presents for/with See problem oriented charting Chief Complaint  Patient presents with   Acute Visit    Bronchitis symptoms started 3 days ago; wheezing, coughing, runny nose; no fever;    Past Medical History-  Patient Active Problem List   Diagnosis Date Noted   Hyponatremia 02/16/2021    Priority: High   Paroxysmal atrial fibrillation (HCC) 04/14/2020    Priority: High   S/P ICD (internal cardiac defibrillator) procedure 04/14/2020    Priority: High   Status post reverse total shoulder replacement, left 11/11/2019    Priority: High   Non-ischemic cardiomyopathy (HCC)     Priority: High   Chronic combined systolic and diastolic CHF, NYHA class 3 (HCC) 10/2016    Priority: High   Alcohol  dependence in remission (HCC) 03/19/2016    Priority: High   Aortic atherosclerosis 03/22/2021    Priority: Medium    GAD (generalized anxiety disorder) 05/31/2020    Priority: Medium    Major depression, recurrent, full remission 04/15/2019    Priority: Medium    Hyperlipidemia 05/27/2018    Priority: Medium    Essential hypertension 11/08/2016    Priority: Medium    GI bleed 03/19/2016    Priority: Medium    ED (erectile dysfunction) 08/21/2015    Priority: Medium    Hyperglycemia 08/21/2015    Priority: Medium    Insomnia 08/21/2015    Priority: Medium    OSA (obstructive sleep apnea) 08/21/2015    Priority: Medium    Retained orthopedic hardware 11/20/2018    Priority: Low   Unspecified fracture of upper end of left humerus, initial encounter for closed fracture 09/03/2017    Priority: Low   S/P shoulder replacement 11/24/2015    Priority: Low   Allergic rhinitis 08/21/2015    Priority: Low   Degenerative joint disease of left hip 03/27/2015    Priority: Low   Closed fracture of distal end of left radius 02/01/2022    Priority: 1.   Lumbar  spondylosis 01/16/2022    Priority: 1.   Osteoarthritis of right hip 05/03/2021    Priority: 1.   Osteoarthritis of left hip 05/03/2021    Priority: 1.   Thoracic compression fracture (HCC) 02/22/2021    Priority: 1.   Avascular necrosis of left humeral head (HCC) 11/20/2018    Priority: 1.   Left shoulder pain 11/04/2018    Priority: 1.   Acute appendicitis 09/05/2023   Liver lesion, left lobe 09/05/2023   Macrocytosis without anemia 03/10/2023   CAD (coronary artery disease) 03/08/2023   AICD discharge 03/08/2023   History of non-ST elevation myocardial infarction (NSTEMI) 02/18/2023   Other fatigue 10/17/2022   Morbid obesity due to excess calories (HCC) 10/17/2022   VT (ventricular tachycardia) (HCC) 08/28/2022   CHF (congestive heart failure) (HCC) 08/28/2022   Mild episode of recurrent major depressive disorder 04/15/2019    Medications- reviewed and updated Current Outpatient Medications  Medication Sig Dispense Refill   acetaminophen  (TYLENOL ) 500 MG tablet Take 1,000 mg by mouth daily.     albuterol  (VENTOLIN  HFA) 108 (90 Base) MCG/ACT inhaler Inhale 2 puffs into the lungs every 6 (six) hours as needed for wheezing or shortness of breath. 1 each 2   apixaban  (ELIQUIS ) 5 MG TABS tablet Take 1 tablet (5 mg total) by mouth 2 (two) times daily. PLEASE SCHEDULE APPOINTMENT FOR MORE REFILLS  60 tablet 1   bismuth subsalicylate (PEPTO BISMOL) 262 MG/15ML suspension Take 30 mLs by mouth every 6 (six) hours as needed for diarrhea or loose stools.     carvedilol  (COREG ) 6.25 MG tablet Take 1 tablet (6.25 mg total) by mouth 2 (two) times daily with a meal. 180 tablet 3   dapagliflozin  propanediol (FARXIGA ) 10 MG TABS tablet TAKE 1 TABLET (10 MG TOTAL) BY MOUTH DAILY. NEEDS FOLLOW UP APPOINTMENT FOR MORE REFILLS 90 tablet 3   famotidine -calcium  carbonate-magnesium hydroxide (PEPCID  COMPLETE) 10-800-165 MG chewable tablet Chew 1 tablet by mouth 2 (two) times daily as needed. 100 tablet  11   gatifloxacin (ZYMAXID) 0.5 % SOLN Place 1 drop into the left eye 4 (four) times daily.     ipratropium-albuterol  (DUONEB) 0.5-2.5 (3) MG/3ML SOLN Take 3 mLs by nebulization every 4 (four) hours as needed. 30 mL 1   ketorolac  (ACULAR ) 0.5 % ophthalmic solution Place 1 drop into the left eye 4 (four) times daily.     molnupiravir EUA (LAGEVRIO) 200 mg CAPS capsule Take 4 capsules (800 mg total) by mouth 2 (two) times daily for 5 days. 40 capsule 0   Multiple Vitamin (MULTIVITAMIN WITH MINERALS) TABS tablet Take 1 tablet by mouth in the morning.     prednisoLONE acetate (PRED FORTE) 1 % ophthalmic suspension Place 1 drop into the left eye 4 (four) times daily.     predniSONE  (DELTASONE ) 20 MG tablet Take 2 pills for 3 days, 1 pill for 4 days 10 tablet 0   rosuvastatin  (CRESTOR ) 20 MG tablet Take 1 tablet (20 mg total) by mouth daily. 90 tablet 2   sacubitril -valsartan  (ENTRESTO ) 97-103 MG TAKE 1 TABLET BY MOUTH TWICE A DAY 180 tablet 2   sertraline  (ZOLOFT ) 100 MG tablet TAKE 1 TABLET BY MOUTH EVERY DAY 90 tablet 3   sildenafil  (VIAGRA ) 100 MG tablet TAKE 1 TABLET (100 MG TOTAL) BY MOUTH AT BEDTIME AS NEEDED FOR ERECTILE DYSFUNCTION. 90 tablet 3   spironolactone  (ALDACTONE ) 25 MG tablet Take 1 tablet (25 mg total) by mouth daily.     torsemide  (DEMADEX ) 20 MG tablet TAKE 2 TABLETS (40 MG TOTAL) BY MOUTH DAILY. 180 tablet 0   traZODone  (DESYREL ) 50 MG tablet TAKE 1 TABLET BY MOUTH EVERYDAY AT BEDTIME 90 tablet 3   triamcinolone  (NASACORT ) 55 MCG/ACT AERO nasal inhaler Place 1 spray into the nose daily. For allergy symptoms. Start with 1 spray each side twice a day for 3 days, then reduce to daily. 1 each 2   No current facility-administered medications for this visit.     Objective:  BP 134/78 (BP Location: Left Arm, Patient Position: Sitting, Cuff Size: Normal)   Pulse 83   Temp 97.7 F (36.5 C) (Temporal)   Ht 5' 11 (1.803 m)   Wt 256 lb (116.1 kg)   SpO2 96%   BMI 35.70 kg/m  Gen:  NAD, resting comfortably CV: RRR no murmurs rubs or gallops Lungs: Diffuse wheeze bilaterally with some rhonchi but no crackles Ext: trace edema Skin: warm, dry  Results for orders placed or performed in visit on 08/16/24 (from the past 24 hours)  POCT Influenza A/B     Status: None   Collection Time: 08/16/24  3:47 PM  Result Value Ref Range   Influenza A, POC Negative Negative   Influenza B, POC Negative Negative  POC COVID-19     Status: Abnormal   Collection Time: 08/16/24  3:47 PM  Result Value Ref Range  SARS Coronavirus 2 Ag Positive (A) Negative   *Note: Due to a large number of results and/or encounters for the requested time period, some results have not been displayed. A complete set of results can be found in Results Review.        Assessment and Plan   # Covid 19 S:symptom(s) started on Friday. Has noted coughing, runny nose and wheezing. No known asthma history but has had bronchitis in past.  No shortness of breath outside of some mild wheeze  Earlier illness in November had completely resolved A/P: Patient with testing confirming covid 19 with first day of covid 19 symptoms  Friday November 28th.  Vaccination status: last vaccine was 2021   Therefore: - recommended patient watch closely for shortness of breath or confusion or worsening symptoms and if those occur patient should contact us  immediately or seek care in the emergency department -recommended patient consider purchasing pulse oximeter and if levels 94% or below persistently- seek care at the hospital (very unlikely for this to happen without significant underlying respiratory disease) - Isolation guidelines per CDC as of march 2024- The recommendations suggest returning to normal activities when, for at least 24 hours, symptoms are improving overall, and if a fever was present, it has been gone without use of a fever-reducing medication. Once people resume normal activities, they are encouraged to take  additional prevention strategies for the next 5 days to curb disease spread, such as taking more steps for cleaner air, enhancing hygiene practices, wearing a well-fitting mask, keeping a distance from others, and/or getting tested for respiratory viruses. -Patient should inform close contacts about exposure (anyone patient been around unmasked for more than 15 minutes)    -since High risk for complications-we discussed outpatient therapeutic options including paxlovid (and risk of rebound)- but in his case not a candidate so we opted for molnupiravir - patient opted for prednisone  with his wheeze as well- reports doing well with this in past with bronchitic symptoms and wheeze. 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 Cindie and Dr. Real reassuring recent transmission #HTN S: Medication:Carvedilol  6.25 mg twice daily, Entresto  97-103 mg, Farxiga  10 mg, spironolactone  25 mg, torsemide  40 mg daily   Edema: no increase Weight gain: Not in particular-variable around his weight Shortness of breath: Some with wheezing  A/P: CHF-appears euvolemic-I do not think his CHF is contributing to his wheeze-seems primarily respiratory related to COVID Hypertension-well-controlled with current regimen-continue current medications    # Atrial fibrillation-required cardioversion 10/05/2022 with ICD with dual-chamber device to allow pacing S: Rate controlled with carvedilol  6.25 mg twice daily Anticoagulated with eliquis  5 mg twice daily  A/P: appropriately anticoagulated and rate controlled- continue current medicine.  Unfortunately with his Eliquis  we are unable to prescribe Paxlovid    Recommended follow up: Return for as needed for  new, worsening, persistent symptoms. Future Appointments  Date Time Provider Department Center  08/26/2024  9:20 AM Croitoru, Mihai, MD [facility]  11/10/2024  7:05 AM [facility] DEVICE REMOTES [facility]  05/30/2025  3:00 PM [facility] ANNUAL WELLNESS VISIT 1 [facility] [place]    Lab/Order associations:   ICD-10-CM   1. Acute cough  R05.1 POCT Influenza A/B    POC COVID-19    2. Paroxysmal atrial  fibrillation (HCC)  I48.0     3. Chronic combined systolic and diastolic CHF, NYHA class 3 (HCC)  I50.42     4. Essential hypertension  I10       Meds ordered this encounter  Medications   molnupiravir EUA (LAGEVRIO) 200 mg CAPS capsule    Sig: Take 4 capsules (800 mg total) by mouth 2 (two) times daily for 5 days.    Dispense:  40 capsule    Refill:  0   albuterol  (VENTOLIN  HFA) 108 (90 Base) MCG/ACT inhaler    Sig: Inhale 2 puffs into the lungs every 6 (six) hours as needed for wheezing or shortness of breath.    Dispense:  1 each    Refill:  2   predniSONE  (DELTASONE ) 20 MG tablet    Sig: Take 2 pills for 3 days, 1 pill for 4 days    Dispense:  10 tablet    Refill:  0    Return precautions advised.  Garnette Lukes, MD

## 2024-08-16 NOTE — Progress Notes (Signed)
 Remote ICD Transmission

## 2024-08-16 NOTE — Telephone Encounter (Signed)
 Patient declining to be seen for an appt to be evaluated. Only requesting abx.

## 2024-08-18 ENCOUNTER — Other Ambulatory Visit (HOSPITAL_COMMUNITY): Payer: Self-pay | Admitting: Cardiology

## 2024-08-26 ENCOUNTER — Ambulatory Visit: Admitting: Cardiovascular Disease

## 2024-08-29 ENCOUNTER — Telehealth: Payer: Self-pay | Admitting: Cardiovascular Disease

## 2024-08-30 ENCOUNTER — Ambulatory Visit: Payer: Self-pay

## 2024-08-30 NOTE — Telephone Encounter (Signed)
°*  STAT* If patient is at the pharmacy, call can be transferred to refill team.   1. Which medications need to be refilled? (please list name of each medication and dose if known) torsemide  (DEMADEX ) 20 MG tablet    2. Would you like to learn more about the convenience, safety, & potential cost savings by using the [facility]?    3. Are you open to using the [facility] (Type [facility].  ).   4. Which pharmacy/location (including street and city if local pharmacy) is medication to be sent to? [id number] [place], [place] - [address]    5. Do they need a 30 day or 90 day supply? 90 day  Patient is out of medication

## 2024-08-30 NOTE — Telephone Encounter (Signed)
 Refill sent

## 2024-08-30 NOTE — Telephone Encounter (Signed)
 FYI Only or Action Required?: Action required by provider: clinical question for provider and requests cough medicine to pharmacy today, acute scheduled on 08/31/24.  Patient was last seen in primary care on 08/16/2024 by Katrinka Garnette KIDD, MD.  Called Nurse Triage reporting Cough and Covid Positive.  Symptoms began several weeks ago.  Interventions attempted: Prescription medications: inhaler, antiviral and Rest, hydration, or home remedies.  Symptoms are: gradually improving.  Triage Disposition: See HCP Within 4 Hours (Or PCP Triage)  Patient/caregiver understands and will follow disposition?: Yes, but will wait    Reason for Disposition  MILD difficulty breathing (e.g., minimal/no SOB at rest, SOB with walking, pulse < 100)  Answer Assessment - Initial Assessment Questions Additional info: Patient called in to request medication to be sent to pharmacy for persistent cough. Diagnosed with Covid 19 on Dec 1. Acute appointment scheduled o 08/31/24, patient requesting medication today to pharmacy.     1. SYMPTOMS: What is your main symptom or concern? (e.g., cough, fever, shortness of breath, muscle aches)     Cough and wheeze  2. ONSET: When did the symptoms start?      12/1 3. COUGH: Do you have a cough? If Yes, ask: How bad is the cough?       Clear  4. FEVER: Do you have a fever? If Yes, ask: What is your temperature, how was it measured, and when did it start?     denies 5. BREATHING DIFFICULTY: Are you having any difficulty breathing? (e.g., normal; shortness of breath, wheezing, unable to speak)      Intermittent wheezing  6. BETTER-SAME-WORSE: Are you getting better, staying the same or getting worse compared to yesterday?  If getting worse, ask, In what way?     better 7. OTHER SYMPTOMS: Do you have any other symptoms?  (e.g., chills, fatigue, headache, loss of smell or taste, muscle pain, sore throat)     Feels he has bronchitis. Cough with clear  sputum, intermittent SOB. Using inhalers when wife reminds him, not more often than prescribed.  8. COVID-19 DIAGNOSIS: How do you know that you have COVID? (e.g., positive lab test or self-test, diagnosed by doctor or NP/PA, symptoms after exposure).     08/16/24 9. COVID-19 EXPOSURE: Was there any known exposure to COVID before the symptoms began?       10. COVID-19 VACCINE: Have you had the COVID-19 vaccine? If Yes, ask: When did you last get it?        11. HIGH RISK DISEASE: Do you have any chronic medical problems? (e.g., asthma, heart or lung disease, weak immune system, obesity, etc.)        12. PREGNANCY: Is there any chance you are pregnant? When was your last menstrual period?        13. O2 SATURATION MONITOR:  Do you use an oxygen  saturation monitor (pulse oximeter) at home? If Yes, ask What is your reading (oxygen  level) today? What is your usual oxygen  saturation reading? (e.g., 95%)  Protocols used: COVID-19 - Diagnosed or Suspected-A-AH

## 2024-08-30 NOTE — Telephone Encounter (Signed)
 Since he's improving I think its best to see him tomorrow to see which direction to go in. I could send something in but plan could change based on exam and we are so close to the visit.   Please check on him. If he has worsening or continuing sinus pressure/congestion ill go ahead and send in Augmentin because that would at least treat that portion

## 2024-08-30 NOTE — Telephone Encounter (Signed)
 Please see message and advise

## 2024-08-30 NOTE — Telephone Encounter (Signed)
 Copied from CRM #[id number]. Topic: Clinical - Red Word Triage >> Aug 30, 2024  9:44 AM Antwanette L wrote: Red Word that prompted transfer to Nurse Triage: The pt was seen by provider on 12/1 and was diagnosed with Covid. The pt reports having bronchiolitis and is asking if medication can be prescribed. He is currently experiencing wheezing and coughing up clear mucus. >> Aug 30, 2024  1:09 PM Drema MATSU wrote: Pt is calling for an update on callback. He did not want to hold. >> Aug 30, 2024 10:05 AM Antwanette L wrote: Patient unable to remain on hold due to long wait time.He is requesting a callback at Mobile [phone]

## 2024-08-31 ENCOUNTER — Ambulatory Visit: Admitting: Family Medicine

## 2024-08-31 ENCOUNTER — Other Ambulatory Visit: Payer: Self-pay | Admitting: Family Medicine

## 2024-08-31 MED ORDER — DOXYCYCLINE HYCLATE 100 MG PO TABS: 100.0000 mg | ORAL_TABLET | Freq: Two times a day (BID) | ORAL | 0 refills | Status: DC

## 2024-08-31 NOTE — Telephone Encounter (Signed)
 Pt will not be able to make appt today. He would like prescription still sent in.

## 2024-08-31 NOTE — Telephone Encounter (Signed)
 Doxy sent to pharmacy

## 2024-09-02 ENCOUNTER — Ambulatory Visit: Admitting: Family

## 2024-09-06 ENCOUNTER — Encounter: Payer: Self-pay | Admitting: Family Medicine

## 2024-09-06 ENCOUNTER — Ambulatory Visit: Admitting: Family Medicine

## 2024-09-06 ENCOUNTER — Ambulatory Visit: Admitting: Family

## 2024-09-06 VITALS — BP 124/88 | HR 77 | Temp 97.5°F | Ht 71.0 in | Wt 255.8 lb

## 2024-09-06 DIAGNOSIS — U071 COVID-19: Secondary | ICD-10-CM | POA: Diagnosis not present

## 2024-09-06 DIAGNOSIS — E669 Obesity, unspecified: Secondary | ICD-10-CM | POA: Diagnosis not present

## 2024-09-06 DIAGNOSIS — I5042 Chronic combined systolic (congestive) and diastolic (congestive) heart failure: Secondary | ICD-10-CM | POA: Diagnosis not present

## 2024-09-06 DIAGNOSIS — Z131 Encounter for screening for diabetes mellitus: Secondary | ICD-10-CM | POA: Diagnosis not present

## 2024-09-06 DIAGNOSIS — F1021 Alcohol dependence, in remission: Secondary | ICD-10-CM | POA: Diagnosis not present

## 2024-09-06 DIAGNOSIS — Z125 Encounter for screening for malignant neoplasm of prostate: Secondary | ICD-10-CM | POA: Diagnosis not present

## 2024-09-06 DIAGNOSIS — I10 Essential (primary) hypertension: Secondary | ICD-10-CM

## 2024-09-06 LAB — COMPREHENSIVE METABOLIC PANEL WITH GFR
ALT: 13 U/L (ref 3–53)
AST: 15 U/L (ref 5–37)
Albumin: 4.3 g/dL (ref 3.5–5.2)
Alkaline Phosphatase: 59 U/L (ref 39–117)
BUN: 18 mg/dL (ref 6–23)
CO2: 32 meq/L (ref 19–32)
Calcium: 9.6 mg/dL (ref 8.4–10.5)
Chloride: 96 meq/L (ref 96–112)
Creatinine, Ser: 1.1 mg/dL (ref 0.40–1.50)
GFR: 71.58 mL/min
Glucose, Bld: 126 mg/dL — ABNORMAL HIGH (ref 70–99)
Potassium: 3.6 meq/L (ref 3.5–5.1)
Sodium: 138 meq/L (ref 135–145)
Total Bilirubin: 0.5 mg/dL (ref 0.2–1.2)
Total Protein: 6.8 g/dL (ref 6.0–8.3)

## 2024-09-06 LAB — CBC WITH DIFFERENTIAL/PLATELET
Basophils Absolute: 0 K/uL (ref 0.0–0.1)
Basophils Relative: 0.5 % (ref 0.0–3.0)
Eosinophils Absolute: 0.1 K/uL (ref 0.0–0.7)
Eosinophils Relative: 1.6 % (ref 0.0–5.0)
HCT: 39.6 % (ref 39.0–52.0)
Hemoglobin: 13.1 g/dL (ref 13.0–17.0)
Lymphocytes Relative: 25 % (ref 12.0–46.0)
Lymphs Abs: 2.1 K/uL (ref 0.7–4.0)
MCHC: 33.2 g/dL (ref 30.0–36.0)
MCV: 98.9 fl (ref 78.0–100.0)
Monocytes Absolute: 0.7 K/uL (ref 0.1–1.0)
Monocytes Relative: 8.3 % (ref 3.0–12.0)
Neutro Abs: 5.4 K/uL (ref 1.4–7.7)
Neutrophils Relative %: 64.6 % (ref 43.0–77.0)
Platelets: 265 K/uL (ref 150.0–400.0)
RBC: 4 Mil/uL — ABNORMAL LOW (ref 4.22–5.81)
RDW: 14.4 % (ref 11.5–15.5)
WBC: 8.3 K/uL (ref 4.0–10.5)

## 2024-09-06 LAB — MAGNESIUM: Magnesium: 2.1 mg/dL (ref 1.5–2.5)

## 2024-09-06 LAB — HEMOGLOBIN A1C: Hgb A1c MFr Bld: 5.8 % (ref 4.6–6.5)

## 2024-09-06 LAB — PSA, MEDICARE: PSA: 6.52 ng/mL — ABNORMAL HIGH (ref 0.10–4.00)

## 2024-09-06 MED ORDER — BENZONATATE 100 MG PO CAPS: 100.0000 mg | ORAL_CAPSULE | Freq: Three times a day (TID) | ORAL | 0 refills | Status: AC | PRN

## 2024-09-06 NOTE — Patient Instructions (Addendum)
 Please stop by lab before you go If you have mychart- we will send your results within 3 business days of us  receiving them.  If you do not have mychart- we will call you about results within 5 business days of us  receiving them.  *please also note that you will see labs on mychart as soon as they post. I will later go in and write notes on them- will say notes from Dr. Katrinka   Try tessalon  for cough. Discussed possible x-ray with persistent symptom(s) vs this just being related to COVID and or long COVID which can last month or years  Restart CPAP and retrial trazodone . We discussed CBT I for insomnia but you wanted to hold off for now and retrial this  Recommended follow up: Return in about 6 months (around 03/07/2025) for physical or sooner if needed.Schedule b4 you leave.
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
## 2024-09-08 ENCOUNTER — Other Ambulatory Visit (HOSPITAL_BASED_OUTPATIENT_CLINIC_OR_DEPARTMENT_OTHER): Payer: Self-pay | Admitting: Physician Assistant

## 2024-09-08 ENCOUNTER — Ambulatory Visit: Payer: Self-pay | Admitting: Family Medicine

## 2024-09-08 DIAGNOSIS — M542 Cervicalgia: Secondary | ICD-10-CM

## 2024-09-08 DIAGNOSIS — R972 Elevated prostate specific antigen [PSA]: Secondary | ICD-10-CM

## 2024-09-08 NOTE — Progress Notes (Signed)
 Spoke to patient. He expressed understanding for labs results and the referral to Urology.

## 2024-09-11 ENCOUNTER — Ambulatory Visit: Payer: Self-pay | Admitting: Cardiovascular Disease

## 2024-10-02 ENCOUNTER — Ambulatory Visit (HOSPITAL_BASED_OUTPATIENT_CLINIC_OR_DEPARTMENT_OTHER): Admission: RE | Admit: 2024-10-02 | Source: Ambulatory Visit

## 2024-11-10 ENCOUNTER — Ambulatory Visit: Payer: Self-pay

## 2024-11-17 ENCOUNTER — Other Ambulatory Visit (HOSPITAL_COMMUNITY)

## 2024-12-02 ENCOUNTER — Ambulatory Visit: Admitting: Cardiovascular Disease

## 2025-05-30 ENCOUNTER — Ambulatory Visit
